# Patient Record
Sex: Female | Born: 1980 | Race: White | Hispanic: No | Marital: Single | Smoking: Former smoker
Health system: Southern US, Community
[De-identification: ages and names within clinical notes are randomized; demographics above are authoritative.]

## PROBLEM LIST (undated history)

## (undated) DIAGNOSIS — F32A Depression, unspecified: Secondary | ICD-10-CM

## (undated) DIAGNOSIS — I5032 Chronic diastolic (congestive) heart failure: Secondary | ICD-10-CM

## (undated) DIAGNOSIS — E119 Type 2 diabetes mellitus without complications: Secondary | ICD-10-CM

## (undated) DIAGNOSIS — E785 Hyperlipidemia, unspecified: Secondary | ICD-10-CM

## (undated) DIAGNOSIS — L03119 Cellulitis of unspecified part of limb: Secondary | ICD-10-CM

## (undated) DIAGNOSIS — N179 Acute kidney failure, unspecified: Secondary | ICD-10-CM

## (undated) DIAGNOSIS — F329 Major depressive disorder, single episode, unspecified: Secondary | ICD-10-CM

## (undated) DIAGNOSIS — E662 Morbid (severe) obesity with alveolar hypoventilation: Secondary | ICD-10-CM

## (undated) DIAGNOSIS — E039 Hypothyroidism, unspecified: Secondary | ICD-10-CM

## (undated) DIAGNOSIS — I493 Ventricular premature depolarization: Secondary | ICD-10-CM

## (undated) DIAGNOSIS — D649 Anemia, unspecified: Secondary | ICD-10-CM

## (undated) DIAGNOSIS — G4730 Sleep apnea, unspecified: Secondary | ICD-10-CM

## (undated) DIAGNOSIS — I10 Essential (primary) hypertension: Secondary | ICD-10-CM

## (undated) DIAGNOSIS — K219 Gastro-esophageal reflux disease without esophagitis: Secondary | ICD-10-CM

## (undated) DIAGNOSIS — A419 Sepsis, unspecified organism: Secondary | ICD-10-CM

## (undated) DIAGNOSIS — Z6841 Body Mass Index (BMI) 40.0 and over, adult: Secondary | ICD-10-CM

## (undated) DIAGNOSIS — L0390 Cellulitis, unspecified: Secondary | ICD-10-CM

## (undated) DIAGNOSIS — G4733 Obstructive sleep apnea (adult) (pediatric): Secondary | ICD-10-CM

## (undated) DIAGNOSIS — I872 Venous insufficiency (chronic) (peripheral): Secondary | ICD-10-CM

## (undated) HISTORY — DX: Acute kidney failure, unspecified: N17.9

## (undated) HISTORY — DX: Body Mass Index (BMI) 40.0 and over, adult: Z684

## (undated) HISTORY — DX: Hyperlipidemia, unspecified: E78.5

## (undated) HISTORY — DX: Chronic diastolic (congestive) heart failure: I50.32

## (undated) HISTORY — DX: Ventricular premature depolarization: I49.3

## (undated) HISTORY — PX: WISDOM TOOTH EXTRACTION: SHX21

## (undated) HISTORY — DX: Venous insufficiency (chronic) (peripheral): I87.2

## (undated) HISTORY — DX: Morbid (severe) obesity with alveolar hypoventilation: E66.2

## (undated) HISTORY — DX: Major depressive disorder, single episode, unspecified: F32.9

## (undated) HISTORY — DX: Cellulitis of unspecified part of limb: L03.119

## (undated) HISTORY — DX: Obstructive sleep apnea (adult) (pediatric): G47.33

## (undated) HISTORY — DX: Depression, unspecified: F32.A

## (undated) HISTORY — DX: Type 2 diabetes mellitus without complications: E11.9

## (undated) HISTORY — DX: Morbid (severe) obesity due to excess calories: E66.01

## (undated) HISTORY — DX: Anemia, unspecified: D64.9

---

## 2005-01-20 ENCOUNTER — Emergency Department (HOSPITAL_COMMUNITY): Admission: EM | Admit: 2005-01-20 | Discharge: 2005-01-20 | Payer: Self-pay | Admitting: Family Medicine

## 2005-04-16 ENCOUNTER — Emergency Department (HOSPITAL_COMMUNITY): Admission: EM | Admit: 2005-04-16 | Discharge: 2005-04-16 | Payer: Self-pay | Admitting: Emergency Medicine

## 2005-08-11 ENCOUNTER — Emergency Department (HOSPITAL_COMMUNITY): Admission: EM | Admit: 2005-08-11 | Discharge: 2005-08-11 | Payer: Self-pay | Admitting: Emergency Medicine

## 2005-08-19 ENCOUNTER — Ambulatory Visit: Payer: Self-pay | Admitting: Internal Medicine

## 2005-09-07 ENCOUNTER — Ambulatory Visit: Payer: Self-pay | Admitting: Internal Medicine

## 2005-09-16 ENCOUNTER — Ambulatory Visit: Payer: Self-pay | Admitting: Internal Medicine

## 2005-09-23 ENCOUNTER — Ambulatory Visit (HOSPITAL_COMMUNITY): Admission: RE | Admit: 2005-09-23 | Discharge: 2005-09-23 | Payer: Self-pay | Admitting: Internal Medicine

## 2005-12-27 ENCOUNTER — Inpatient Hospital Stay (HOSPITAL_COMMUNITY): Admission: EM | Admit: 2005-12-27 | Discharge: 2006-01-02 | Payer: Self-pay | Admitting: Emergency Medicine

## 2005-12-27 ENCOUNTER — Ambulatory Visit: Payer: Self-pay | Admitting: Internal Medicine

## 2005-12-29 ENCOUNTER — Encounter (INDEPENDENT_AMBULATORY_CARE_PROVIDER_SITE_OTHER): Payer: Self-pay | Admitting: Cardiology

## 2006-01-13 ENCOUNTER — Ambulatory Visit: Payer: Self-pay | Admitting: Internal Medicine

## 2006-01-24 ENCOUNTER — Ambulatory Visit: Payer: Self-pay | Admitting: Internal Medicine

## 2007-04-23 ENCOUNTER — Emergency Department (HOSPITAL_COMMUNITY): Admission: EM | Admit: 2007-04-23 | Discharge: 2007-04-23 | Payer: Self-pay | Admitting: Emergency Medicine

## 2007-06-26 ENCOUNTER — Ambulatory Visit: Payer: Self-pay | Admitting: *Deleted

## 2007-06-26 DIAGNOSIS — E1159 Type 2 diabetes mellitus with other circulatory complications: Secondary | ICD-10-CM | POA: Insufficient documentation

## 2007-06-26 DIAGNOSIS — I10 Essential (primary) hypertension: Secondary | ICD-10-CM

## 2007-06-26 DIAGNOSIS — I152 Hypertension secondary to endocrine disorders: Secondary | ICD-10-CM | POA: Insufficient documentation

## 2007-06-29 ENCOUNTER — Telehealth (INDEPENDENT_AMBULATORY_CARE_PROVIDER_SITE_OTHER): Payer: Self-pay | Admitting: *Deleted

## 2007-06-29 ENCOUNTER — Encounter (INDEPENDENT_AMBULATORY_CARE_PROVIDER_SITE_OTHER): Payer: Self-pay | Admitting: *Deleted

## 2007-06-29 ENCOUNTER — Ambulatory Visit: Payer: Self-pay | Admitting: Internal Medicine

## 2007-07-13 ENCOUNTER — Encounter (INDEPENDENT_AMBULATORY_CARE_PROVIDER_SITE_OTHER): Payer: Self-pay | Admitting: Internal Medicine

## 2007-07-13 ENCOUNTER — Ambulatory Visit: Payer: Self-pay | Admitting: Infectious Disease

## 2007-07-13 LAB — CONVERTED CEMR LAB
Anti Nuclear Antibody(ANA): NEGATIVE
Neutrophil cytoplasmic antibodies,IgG,serum: <1:20 {titer}

## 2007-07-16 LAB — CONVERTED CEMR LAB
Aldosterone, Serum: 33
PRA: 13.2

## 2007-07-23 ENCOUNTER — Ambulatory Visit: Payer: Self-pay | Admitting: Internal Medicine

## 2007-07-23 ENCOUNTER — Encounter (INDEPENDENT_AMBULATORY_CARE_PROVIDER_SITE_OTHER): Payer: Self-pay | Admitting: Internal Medicine

## 2007-07-23 LAB — CONVERTED CEMR LAB
Cholesterol: 209 mg/dL — ABNORMAL HIGH (ref 0–200)
HDL: 44 mg/dL (ref >39)
LDL Cholesterol: 108 mg/dL — ABNORMAL HIGH (ref 0–99)
VLDL: 57 mg/dL — ABNORMAL HIGH (ref 0–40)

## 2007-08-23 ENCOUNTER — Telehealth: Payer: Self-pay | Admitting: *Deleted

## 2007-10-15 ENCOUNTER — Telehealth: Payer: Self-pay | Admitting: *Deleted

## 2007-12-11 ENCOUNTER — Telehealth (INDEPENDENT_AMBULATORY_CARE_PROVIDER_SITE_OTHER): Payer: Self-pay | Admitting: *Deleted

## 2009-08-06 ENCOUNTER — Encounter (INDEPENDENT_AMBULATORY_CARE_PROVIDER_SITE_OTHER): Payer: Self-pay | Admitting: *Deleted

## 2009-10-14 ENCOUNTER — Encounter (INDEPENDENT_AMBULATORY_CARE_PROVIDER_SITE_OTHER): Payer: Self-pay | Admitting: Emergency Medicine

## 2009-10-14 ENCOUNTER — Emergency Department (HOSPITAL_COMMUNITY): Admission: EM | Admit: 2009-10-14 | Discharge: 2009-10-14 | Payer: Self-pay | Admitting: Emergency Medicine

## 2009-10-18 ENCOUNTER — Encounter (INDEPENDENT_AMBULATORY_CARE_PROVIDER_SITE_OTHER): Payer: Self-pay | Admitting: Emergency Medicine

## 2009-10-18 ENCOUNTER — Ambulatory Visit: Payer: Self-pay | Admitting: Vascular Surgery

## 2009-10-18 ENCOUNTER — Emergency Department (HOSPITAL_COMMUNITY): Admission: EM | Admit: 2009-10-18 | Discharge: 2009-10-18 | Payer: Self-pay | Admitting: Family Medicine

## 2009-10-18 ENCOUNTER — Inpatient Hospital Stay (HOSPITAL_COMMUNITY): Admission: EM | Admit: 2009-10-18 | Discharge: 2009-10-23 | Payer: Self-pay | Admitting: Emergency Medicine

## 2009-10-20 ENCOUNTER — Encounter (INDEPENDENT_AMBULATORY_CARE_PROVIDER_SITE_OTHER): Payer: Self-pay | Admitting: Internal Medicine

## 2010-09-25 ENCOUNTER — Emergency Department (HOSPITAL_COMMUNITY): Admission: EM | Admit: 2010-09-25 | Discharge: 2010-09-25 | Payer: Self-pay | Admitting: Family Medicine

## 2010-12-26 LAB — CONVERTED CEMR LAB
ALT: 16 units/L (ref 0–35)
BUN: 10 mg/dL (ref 6–23)
CRP: 0.3 mg/dL (ref <0.6)
Calcium, Total (PTH): 9.6 mg/dL (ref 8.4–10.5)
Chloride: 105 meq/L (ref 96–112)
Creatinine, Ser: 0.65 mg/dL (ref 0.40–1.20)
Microalb, Ur: 8.05 mg/dL — ABNORMAL HIGH (ref 0.00–1.89)
Sodium: 137 meq/L (ref 135–145)
Total Protein: 7.2 g/dL (ref 6.0–8.3)

## 2011-03-02 LAB — COMPREHENSIVE METABOLIC PANEL
Albumin: 2.8 g/dL — ABNORMAL LOW (ref 3.5–5.2)
Alkaline Phosphatase: 65 U/L (ref 39–117)
BUN: 20 mg/dL (ref 6–23)
Chloride: 100 mEq/L (ref 96–112)
Creatinine, Ser: 1.22 mg/dL — ABNORMAL HIGH (ref 0.4–1.2)
GFR calc non Af Amer: 52 mL/min — ABNORMAL LOW (ref >60)
Glucose, Bld: 110 mg/dL — ABNORMAL HIGH (ref 70–99)
Potassium: 4 mEq/L (ref 3.5–5.1)
Total Bilirubin: 0.6 mg/dL (ref 0.3–1.2)

## 2011-03-02 LAB — URINALYSIS, ROUTINE W REFLEX MICROSCOPIC
Glucose, UA: NEGATIVE mg/dL
Ketones, ur: NEGATIVE mg/dL
Nitrite: NEGATIVE
Specific Gravity, Urine: 1.022 (ref 1.005–1.030)
Urobilinogen, UA: 0.2 mg/dL (ref 0.0–1.0)
pH: 5.5 (ref 5.0–8.0)

## 2011-03-02 LAB — DIFFERENTIAL
Eosinophils Absolute: 0.2 10*3/uL (ref 0.0–0.7)
Lymphocytes Relative: 24 % (ref 12–46)
Lymphs Abs: 1.9 10*3/uL (ref 0.7–4.0)
Monocytes Relative: 10 % (ref 3–12)

## 2011-03-02 LAB — POCT I-STAT, CHEM 8
Calcium, Ion: 1.04 mmol/L — ABNORMAL LOW (ref 1.12–1.32)
Creatinine, Ser: 1.1 mg/dL (ref 0.4–1.2)
HCT: 38 % (ref 36.0–46.0)
Sodium: 135 mEq/L (ref 135–145)
TCO2: 27 mmol/L (ref 0–100)

## 2011-03-02 LAB — CBC
Hemoglobin: 10.9 g/dL — ABNORMAL LOW (ref 12.0–15.0)
Hemoglobin: 12.2 g/dL (ref 12.0–15.0)
MCHC: 34 g/dL (ref 30.0–36.0)
MCHC: 34.4 g/dL (ref 30.0–36.0)
RBC: 3.51 MIL/uL — ABNORMAL LOW (ref 3.87–5.11)
WBC: 10.3 10*3/uL (ref 4.0–10.5)

## 2011-03-02 LAB — URINE MICROSCOPIC-ADD ON

## 2011-03-02 LAB — CULTURE, BLOOD (ROUTINE X 2): Culture: NO GROWTH

## 2011-03-02 LAB — CARDIAC PANEL(CRET KIN+CKTOT+MB+TROPI)
CK, MB: 1.2 ng/mL (ref 0.3–4.0)
CK, MB: 1.2 ng/mL (ref 0.3–4.0)
CK, MB: 1.2 ng/mL (ref 0.3–4.0)
Relative Index: INVALID (ref 0.0–2.5)
Total CK: 76 U/L (ref 7–177)
Total CK: 79 U/L (ref 7–177)
Troponin I: 0.02 ng/mL (ref 0.00–0.06)
Troponin I: 0.03 ng/mL (ref 0.00–0.06)

## 2011-03-02 LAB — PROTIME-INR: INR: 0.95 (ref 0.00–1.49)

## 2011-03-02 LAB — URINE CULTURE: Colony Count: 40000

## 2011-03-02 LAB — BASIC METABOLIC PANEL
BUN: 14 mg/dL (ref 6–23)
Creatinine, Ser: 0.8 mg/dL (ref 0.4–1.2)
GFR calc non Af Amer: >60 mL/min (ref >60)
Glucose, Bld: 137 mg/dL — ABNORMAL HIGH (ref 70–99)
Potassium: 4.2 mEq/L (ref 3.5–5.1)

## 2011-03-02 LAB — BRAIN NATRIURETIC PEPTIDE: Pro B Natriuretic peptide (BNP): 74 pg/mL (ref 0.0–100.0)

## 2011-04-15 NOTE — Consult Note (Signed)
NAMENALEIGHA, RAIMONDI NO.:  [id number]   MEDICAL RECORD NO.:  [id number]          PATIENT TYPE:  INP   LOCATION:  5019                         FACILITY:  [facility]   PHYSICIAN:  Bevelyn Buckles. Champey, M.D.DATE OF BIRTH:  1981/07/31   DATE OF CONSULTATION:  12/30/2005  DATE OF DISCHARGE:                                   CONSULTATION   REASON FOR CONSULTATION:  Headache and white matter lesions on MRI.   HISTORY OF PRESENT ILLNESS:  Ms. Isidoro is a 30 year old Caucasian female  with past medical history of hypertension, cardiomyopathy, and obesity who  initially presented with elevated blood pressure and headaches.  The patient  has a history of uncontrolled hypertension and headache for several years.  Her pressures initially were over 200/100 on presentation.  Occasionally,  the patient will get some white, squiggly lines prior to her headaches and  occasional blurred vision.  The patient states occasionally, she will also  feel light headed upon rising quickly.  She has been complaining of  decreased energy and fatigue with occasional nausea.  She denies any focal  weakness, numbness, speech problems, balance problems, falls, vertigo, or  loss of consciousness.  The patient has had an MRI of the brain here that  showed significant white matter lesions suggestive of possibly  demyelination, infectious or inflammatory processes.   PAST MEDICAL HISTORY:  Positive for hypertension and cardiomegaly.   MEDICATIONS PRIOR TO ADMISSION:  None.   CURRENT MEDICATIONS:  The patient is currently on hydrochlorothiazide,  Atenolol, Lisinopril.   ALLERGIES:  The patient has drug allergies to aspirin which causes hives.   FAMILY HISTORY:  Positive for hypertension and heart disease.   SOCIAL HISTORY:  The patient currently lives with a roommate, socially  drinks alcohol, denies tobacco or drug use.   REVIEW OF SYSTEMS:  Positive for bronchitis two weeks ago, shortness of  breath, and as per HPI.  Negative in greater than seven other organ systems.   PHYSICAL EXAMINATION:  VITAL SIGNS:  Temperature 97.2, pulse 67, respirations 20, blood pressure  151/91, O2 saturation 93% on room air.  HEENT:  Normocephalic, atraumatic, extraocular movements intact, pupils  equal, round, reactive to light.  NECK:  Supple, no carotid bruits.  HEART:  Regular.  LUNGS:  Clear.  ABDOMEN:  Soft, nontender.  EXTREMITIES:  No edema, good pulses.  NEUROLOGICAL:  The patient is awake, alert and oriented x 3, language is  fluent, memory and  knowledge are within normal limits.  Cranial nerves 2-12  are grossly intact.  Motor examination reveals 5/5 strength throughout,  unable to assess proximal right lower extremity secondary to recent  catheterization.  Tone is normal in all four extremities, no drift is noted.  Sensory examination is within normal limits to light touch.  Reflexes are  trace to 1+ throughout, toes downgoing bilaterally.  Cerebellar function is  within normal limits finger-to-nose.  Gait is not assessed secondary to cath  and unable to move at this point.   LABORATORY DATA:  WBC 7.4, hemoglobin 10.8, hematocrit 31.5, platelets 242.  Protime 13.7,  INR 1, PTT 29.  Sodium 137, potassium 3.9, chloride 103, CO2  26, BUN 15, creatinine 1, glucose 81, calcium 9.1.  Cholesterol 147, HDL 33,  LDL 90.  MRI showed numerous white matter lesions suggestive of  demyelinating process, inflammatory or infectious etiology.   IMPRESSION:  Ms. Cheever is a 30 year old Caucasian female with elevated blood  pressure, headaches, and white matter lesions on MRI suggestive of a  possible demyelinating disease or inflammatory process.  Her neurological  examination at this point is essentially nonfocal, however, the white matter  lesions are concerning for a demyelinating process or inflammatory process.  We will try the lumbar puncture today once the patient is mobile which is  around  3 p.m.  We will send her CSF fluid for appropriate studies.  If the  patient is stable after we obtain the basic results, the other studies that  will be sent for CSF can be followed as an outpatient, can also get visual  evoked potentials as an outpatient, as well.  This plan is discussed with  the patient who agrees to have the procedure done today.  We will follow up  with the patient while she is in the hospital.      Bevelyn Buckles. Nash Shearer, M.D.  Electronically Signed     DRC/MEDQ  D:  12/30/2005  T:  12/30/2005  Job:  [id number]

## 2011-04-15 NOTE — Discharge Summary (Signed)
Alexandra Beck, Alexandra Beck NO.:  [id number]   MEDICAL RECORD NO.:  [id number]          PATIENT TYPE:  INP   LOCATION:  5019                         FACILITY:  [facility]   PHYSICIAN:  Hillery Aldo, M.D.   DATE OF BIRTH:  Sep 16, 1981   DATE OF ADMISSION:  12/27/2005  DATE OF DISCHARGE:                                 DISCHARGE SUMMARY   DATE OF DISCHARGE:  Pending.   PRIMARY CARE PHYSICIAN:  Currently unassigned but will be referred to the  Outpatient Clinic at the [facility].   DISCHARGE DIAGNOSES:  1.  Malignant hypertension.  2.  Headache secondary to #1.  3.  Obesity.  4.  Congestive heart failure with ejection fraction of 20%.  5.  Proteinuria.  6.  White mater changes on MRI worrisome for demyelinating disease versus      vasculitis, workup ongoing.   DISCHARGE MEDICATIONS):  (May be amended at the actual discharge.)  1.  Atenolol 100 milligrams b.i.d.  2.  Lisinopril 40 milligrams b.i.d.  3.  Lasix 40 milligrams daily.  4.  Potassium chloride 20 mEq daily.   CONSULTATIONS:  1.  Dr. Nash Shearer of neurology  2.  Dr. Ladona Ridgel of cardiology.   PROCEDURES AND DIAGNOSTIC STUDIES:  1.  Chest x-ray on December 27, 2005 showed cardiomegaly with diffuse      peribronchial thickening and interstitial edema. No definite      consolidation, pleural effusion or hemothorax.  2.  CT scan of the head on December 27, 2005 showed left basal ganglia      hyperdensity which may represent small hypertensive hemorrhage versus      early physiologic calcification. Nonspecific hypoattenuation in deep      left frontal white mater.  3.  MRI/MRA of the brain on December 28, 2005 showed abnormal fairly      significant white mater type changes which may be related to      demyelinating process such as multiple sclerosis; however, acute      disseminating encephalomyelitis is also a possibility. Other      considerations are underlying vasculitis, inflammatory process or    infectious process. Tumor felt unlikely. However, close follow-up      recommended for further delineation. A small area of restricted border      motion and enhancement involving the right cerebellum can be further      delineated as by MR criteria which has the appearance suggestive of a      small acute infarct versus active areas of demyelination. MRA showed      flow within the major intracranial vessels.  4.  MRA of the renal arteries on December 28, 2005 showed left renal artery      with diffuse beaded appearance questionable for fibromuscular dysplasia.      Patent right renal origin with poor visualization of the right renal      artery distally. Patent visceral vasculature. No significant aortobi-      iliac disease.  5.  Renal arteriogram on December 30, 2005 was normal  6.  LP  under fluoroscopy on December 30, 2005: Successful LP with opening      pressure of 25 cm of water. 12 cc of clear CSF fluid was collected for      further evaluation.  7.  2-D echocardiography done on November 28, 2005 showed an ejection fraction      of 20% with markedly decreased LV systolic function. There was severe      diffuse left ventricular hypokinesis. The left ventricular wall      thickness was moderately increased. Features were also consistent with      severe diastolic dysfunction.   DISCHARGE LABORATORY VALUES:  Will be dictated at the time of actual  discharge.   HOSPITAL COURSE:  Problem 1: MALIGNANT HYPERTENSION: The patient was  admitted and empirically treated with metoprolol and lisinopril. She was  also put on hydrochlorothiazide which was changed to Lasix for better blood  pressure control. Blood pressure was controlled on this regimen. A workup  was undertaken to look for secondary causes of hypertension. She was not  found to have renal artery fibromuscular dysplasia or renal artery stenosis  based on diagnostic testing. Her cortisol level was within normal limits. At  the  time of this dictation, a 24-hour urine for metanephrines as being  collected to rule out pheochromocytoma. Given the findings of her MRI, a  concern has been raised for vasculitis and a vasculitis workup is currently  being done. The only test that has come back thus far is an elevated  sedimentation rate of 37.   Problem 2: CLASS 2 CONGESTIVE HEART FAILURE WITH AN EJECTION FRACTION OF  20%: The patient was seen in consultation by the cardiologist. Given her  young age and the severity of her cardiomyopathy, it was felt that she would  need ongoing cardiology follow-up. Consideration was made for switching her  labetalol to Coreg but given that she can not currently afford this  medication, we will keep her on labetalol for now. She does not have any  health care insurance. Her current medical management included beta blocker,  ACE inhibitor and diuretics. She will [facility] with [facility] as  outpatient.   Problem 3: HEADACHES: The patient's headaches were thought to be secondary  to her hypertension. They resolved with treatment and control of her blood  pressure.   Problem 4: WHITE MATER LESIONS ON MRI OF THE BRAIN: A neurology consultation  was obtained because of the abnormalities noted on the patient's MRI scan.  The neurologist did feel that demyelinating process or inflammatory process  was possible and therefore lumbar puncture was done under fluoroscopy. Her  CSF studies for oligoclonal bands are currently pending. She will follow up  with Dr. Nash Shearer as an outpatient and have her visual evoked potentials done  at that time. In the meantime, a vasculitis workup has been requested.   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 With her significant medical comorbidity, it is  comparative that we get her financial assistance. I have ordered that a financial counselor come and evaluate the patient and that  consideration be  made for application for Medicaid and disability benefits as she is  certainly disabled by her multiple medical problems and will need medical  care to avoid decompensating.   Problem 6: DISPOSITION: The patient will be discharged home when her workup  is completed.          ______________________________  Hillery Aldo, M.D.    CR/MEDQ  D:  01/01/2006  T:  01/02/2006  Job:  [id number]   cc:   Outpatient Clinic

## 2011-04-15 NOTE — Discharge Summary (Signed)
Alexandra Beck, Alexandra Beck                 ACCOUNT NO.:  [id number]   MEDICAL RECORD NO.:  [id number]          PATIENT TYPE:  INP   LOCATION:  5019                         FACILITY:  [facility]   PHYSICIAN:  Mobolaji B. Bakare, M.D.DATE OF BIRTH:  May 12, 1981   DATE OF ADMISSION:  12/27/2005  DATE OF DISCHARGE:  01/02/2006                                 DISCHARGE SUMMARY   ADDENDUM:  To dictation done by Dr. Salome Arnt .   Pending laboratory investigation revealed ANA to be negative. Sedimentation  rate was 26.  At the time of discharge oligoclonal bands result was not  available.  The patient had spoken to Dr. Nash Shearer (neurologist).  She will  followup with him in the office following the results of the CSF.   The patient has issues with finances.  She was seen by a Freight forwarder.  The patient was advised appropriately.   DISCHARGE MEDICATIONS:  1.  Amlodipine 10 mg daily.  2.  Atenolol 100 mg b.i.d.  3.  Furosemide 40 mg daily.  4.  Lisinopril 40 mg b.i.d.  5.  Potassium chloride 20 mEq daily.   FOLLOWUP APPOINTMENTS:  1.  With Dr. Bevelyn Buckles. Champey, M.D.  2.  Dr. Sharrell Ku.  The patient to call for appointment.  3.  [facility] on February 16 at 2:20 p.m.   PENDING LABORATORY DATA:  Urine metanephrine CRP complement, acute  hepatitis, aldolase and Borrelia, CSF with oligoclonal bands.      Mobolaji B. Corky Downs, M.D.  Electronically Signed     MBB/MEDQ  D:  01/20/2006  T:  01/22/2006  Job:  [id number]

## 2011-04-15 NOTE — Consult Note (Signed)
Alexandra Beck, Alexandra Beck NO.:  [id number]   MEDICAL RECORD NO.:  [id number]          PATIENT TYPE:  INP   LOCATION:  5019                         FACILITY:  [facility]   PHYSICIAN:  Doylene Canning. Ladona Ridgel, M.D.  DATE OF BIRTH:  24-Dec-1980   DATE OF CONSULTATION:  DATE OF DISCHARGE:                                   CONSULTATION   DATE OF CARDIOLOGY CONSULTATION:  December 31, 2005.   PHYSICIAN REQUESTING CONSULTATION:  Lianne Moris, MD.   INDICATION FOR CONSULTATION:  Evaluation of hypertension and cardiomyopathy.   HISTORY OF PRESENT ILLNESS:  The patient is a very pleasant, 30 year old  woman with a history of recurrent hypertension and obesity, who was admitted  to the hospital with an episode of malignant hypertension associated with a  headache.  The patient was found on MRI scan to have white matter disease.  The etiology of this was unclear, and there was a question of vasculitis.  She underwent a lumbar puncture, the initial results are pending.  The  patient also has undergone two-D echo, which demonstrated a cardiomyopathy  with an EF of 20 to 25%.  Her heart failure symptoms are very minimal.  She  is able to walk on level ground.  She states that if she gets in a hurry and  if her blood pressure is high, she does have increasing shortness of breath.  She denies peripheral edema.   ADDITIONAL PAST MEDICAL HISTORY:  1.  Obesity.  2.  She also carries a diagnosis of sleep apnea but has not had this      followed up and is not presently wearing a CPAP mask.   FAMILY HISTORY:  Positive for hypertension and heart disease.   SOCIAL HISTORY:  The patient drinks alcohol socially, denies tobacco or  recreational drug use.   REVIEW OF SYSTEMS:  She denies vision or hearing problems, nausea, vomiting,  diarrhea or constipation, polyuria, polydipsia, heat or cold intolerance,  recent weight changes although over she has over the last five years  gradually increased her  weight.  She denies any skin problems.  The rest of  her review of systems was negative except as noted in the HPI.   PHYSICAL EXAMINATION:  GENERAL:  She is a pleasant, young woman, morbidly  obese but in no distress.  VITAL SIGNS:  The blood pressure was 170/100, the pulse was 80 and regular,  the respirations were 18, temperature was 98.  The weight was 250 pounds.  The height was 5 foot 3 inches tall.  HEENT:  Normocephalic and atraumatic.  Pupils were equal and round.  The  oropharynx was moist.  The sclerae were anicteric.  NECK:  No jugulovenous distention, there was no thyromegaly, the trachea was  midline.  CARDIOVASCULAR:  Regular rate and rhythm with normal S1 and S2.  There was a  soft S4 gallop present.  The PMI was laterally displaced.  LUNGS:  Clear bilaterally to auscultation.  There are no wheezes, rales, or  rhonchi present.  There is no increase work of breathing.  ABDOMEN:  Obese,  nontender, nondistended.  There is no organomegaly present.  EXTREMITIES:  No cyanosis, clubbing, or edema.  Pulses were 2+ and  symmetric.  NEUROLOGIC:  Alert and oriented x3.  His cranial nerves were intact.  His  strength was 5/5 and symmetric.  SKIN:  Exam was normal.   ELECTROCARDIOGRAM:  The EKG demonstrates a sinus rhythm with biatrial  enlargement and lateral T-wave abnormality.   IMPRESSION:  1.  Malignant hypertension.  2.  Cardiomyopathy with Class I-II heart failure symptoms.  3.  Morbid obesity.  4.  Headache secondary to all of the above with an abnormal head CT with      white matter disease.   DISCUSSION:  I will defer the patient's hypertension workup to her primary  physician, although switching from the Labetalol to Coreg would be a  consideration, which might help her with the blood pressure control.  I will  plan to see her back in the office in followup.  She is instructed to call  for an appointment, and sometime she will need a pheochromocytoma workup,  which  we will plan on having her primary doctors direct.           ______________________________  Doylene Canning. Ladona Ridgel, M.D.     GWT/MEDQ  D:  12/31/2005  T:  12/31/2005  Job:  [id number]

## 2011-04-15 NOTE — H&P (Signed)
Alexandra Beck, FREITAS                 ACCOUNT NO.:  [id number]   MEDICAL RECORD NO.:  [id number]          PATIENT TYPE:  INP   LOCATION:  1830                         FACILITY:  [facility]   PHYSICIAN:  Jonna L. Robb Matar, M.D.DATE OF BIRTH:  02-Oct-1981   DATE OF ADMISSION:  12/27/2005  DATE OF DISCHARGE:                                HISTORY & PHYSICAL   PRIMARY CARE PHYSICIAN:  Unassigned.   CHIEF COMPLAINT:  Headache for an hour prior to coming to the emergency  room.   HISTORY:  This is a 30 year old overweight white female who has known about  hypertension for several years, but has simply been unable to afford  medications and follow-up for it.   ALLERGIES:  ASPIRIN gives her hives.   MEDICATIONS:  None.   PAST MEDICAL HISTORY:  1.  Hypertension.  2.  Obesity.  3.  Cardiomegaly.  She has known about this previously.  She said that last      year she was told her heart was quite large.   FAMILY HISTORY:  Father has fairly severe hypertension.  Mother has some  very mild hypertension.   SOCIAL HISTORY:  Nonsmoker.  Nondrinker.  She is a music major at [employer]   REVIEW OF SYSTEMS:  She has had a really bad headache.  She gets them  frequently and she never knows if they are migraines or from hypertension.  No chest pains.  Her hands and feet swell up, her feet mostly.  No blurred  vision.  No double vision.  She says her thyroid has been checked several  times and she was told that that was normal.  Other systems were reviewed  and were negative.   PHYSICAL EXAMINATION:  VITAL SIGNS:  Temperature 99.2, pulse 102,  respirations 22, blood pressure on admission was 211/138, is now down to  187/102, 96% O2 saturation.  GENERAL:  She is a well-developed, morbidly overweight white female.  She  listed her weight as going from 257 to 248 in the past week, says she really  has not been hungry.  HEENT:  Conjunctivae and lids were normal.  Fundus was normal.  There were  no  hemorrhages or exudates.  Extraocular movements were full.  She has  normal hearing, mucosa, and pharynx.  NECK:  Supple.  No masses, thyromegaly, or carotid bruits.  RESPIRATORY:  Effort is normal.  The lungs were clear without wheezing.  No  dullness.  HEART:  Regular rate and rhythm.  Normal S1, S2 without murmurs, rubs, or  gallops.  There was no cyanosis or clubbing.  She has 1+ edema.  BREASTS:  Without masses or tenderness.  ABDOMEN:  Nontender.  Normal bowel sounds.  No hepatosplenomegaly.  I did  not hear any bruits.  She had no cervical adenopathy.  MUSCULOSKELETAL:  Muscle strength 5/5.  SKIN:  No rash, lesions, or nodules.  NEUROLOGIC:  Cranial nerves are intact.  DTRs are 2+.  Sensation is normal.  She is alert and oriented x3.  Normal memory, judgment, and affect.   LABORATORY DATA:  Urinalysis is positive  for some mild proteinuria.  BUN 13,  creatinine 1.  Chest x-ray shows cardiomegaly and mild interstitial edema.  CT of the head is positive for a tiny left basal ganglia hyperdensity.  They  thought this might be a small bleed.  EKG shows left atrial and ventricular  hypertrophy with some ST changes.   IMPRESSION:  1.  Malignant hypertension.  My suggestion is that she go on two pills or      maybe three, the first an ACE plus HCTZ combination, the second a beta      blocker, and third, if needed, amlodipine all of which can be gotten      generic at [facility] for $4 per month and the patient says she would be      able to afford that.  She also thinks she can get student health to      monitor her once her blood pressure is better controlled.  2.  Cardiomegaly.  3.  Proteinuria.  An ACE inhibitor should be a good idea for her.  4.  Possible intracranial hemorrhage.  Will get an MRI scan in the morning      and try to get her blood pressures much more regulated through the      night.      Jonna L. Robb Matar, M.D.  Electronically Signed     JLB/MEDQ  D:   12/27/2005  T:  12/27/2005  Job:  [id number]

## 2011-06-28 ENCOUNTER — Inpatient Hospital Stay (HOSPITAL_COMMUNITY)
Admission: EM | Admit: 2011-06-28 | Discharge: 2011-06-30 | DRG: 607 | Disposition: A | Discharge status: Home / Self Care | Payer: Self-pay | Attending: Internal Medicine | Admitting: Internal Medicine

## 2011-06-28 ENCOUNTER — Inpatient Hospital Stay (INDEPENDENT_AMBULATORY_CARE_PROVIDER_SITE_OTHER)
Admission: RE | Admit: 2011-06-28 | Discharge: 2011-06-28 | Disposition: A | Discharge status: Home / Self Care | Payer: Self-pay | Source: Ambulatory Visit | Attending: Family Medicine | Admitting: Family Medicine

## 2011-06-28 DIAGNOSIS — I428 Other cardiomyopathies: Secondary | ICD-10-CM | POA: Diagnosis present

## 2011-06-28 DIAGNOSIS — I509 Heart failure, unspecified: Secondary | ICD-10-CM | POA: Diagnosis present

## 2011-06-28 DIAGNOSIS — G4730 Sleep apnea, unspecified: Secondary | ICD-10-CM | POA: Diagnosis present

## 2011-06-28 DIAGNOSIS — L03319 Cellulitis of trunk, unspecified: Secondary | ICD-10-CM

## 2011-06-28 DIAGNOSIS — M793 Panniculitis, unspecified: Principal | ICD-10-CM | POA: Diagnosis present

## 2011-06-28 DIAGNOSIS — E039 Hypothyroidism, unspecified: Secondary | ICD-10-CM | POA: Diagnosis present

## 2011-06-28 DIAGNOSIS — Z6841 Body Mass Index (BMI) 40.0 and over, adult: Secondary | ICD-10-CM

## 2011-06-28 DIAGNOSIS — Z8614 Personal history of Methicillin resistant Staphylococcus aureus infection: Secondary | ICD-10-CM

## 2011-06-28 DIAGNOSIS — I10 Essential (primary) hypertension: Secondary | ICD-10-CM | POA: Diagnosis present

## 2011-06-28 LAB — BASIC METABOLIC PANEL
Calcium: 9.5 mg/dL (ref 8.4–10.5)
Creatinine, Ser: 0.74 mg/dL (ref 0.50–1.10)
GFR calc Af Amer: >60 mL/min (ref >60)
Sodium: 137 mEq/L (ref 135–145)

## 2011-06-28 LAB — DIFFERENTIAL
Basophils Relative: 1 % (ref 0–1)
Eosinophils Absolute: 0.3 10*3/uL (ref 0.0–0.7)
Eosinophils Relative: 4 % (ref 0–5)
Lymphs Abs: 2.9 10*3/uL (ref 0.7–4.0)
Monocytes Relative: 9 % (ref 3–12)
Neutrophils Relative %: 53 % (ref 43–77)

## 2011-06-28 LAB — CBC
MCH: 30.8 pg (ref 26.0–34.0)
MCV: 89.3 fL (ref 78.0–100.0)
Platelets: 203 10*3/uL (ref 150–400)
RDW: 13.3 % (ref 11.5–15.5)
WBC: 8.6 10*3/uL (ref 4.0–10.5)

## 2011-06-29 LAB — COMPREHENSIVE METABOLIC PANEL
ALT: 14 U/L (ref 0–35)
AST: 13 U/L (ref 0–37)
Alkaline Phosphatase: 64 U/L (ref 39–117)
CO2: 28 mEq/L (ref 19–32)
Calcium: 9.1 mg/dL (ref 8.4–10.5)
Chloride: 102 mEq/L (ref 96–112)
GFR calc non Af Amer: >60 mL/min (ref >60)
Potassium: 3.8 mEq/L (ref 3.5–5.1)
Sodium: 138 mEq/L (ref 135–145)
Total Bilirubin: 0.4 mg/dL (ref 0.3–1.2)

## 2011-06-29 LAB — DIFFERENTIAL
Basophils Absolute: 0 10*3/uL (ref 0.0–0.1)
Basophils Relative: 0 % (ref 0–1)
Eosinophils Absolute: 0.3 10*3/uL (ref 0.0–0.7)
Neutro Abs: 6.4 10*3/uL (ref 1.7–7.7)
Neutrophils Relative %: 71 % (ref 43–77)

## 2011-06-29 LAB — CBC
Platelets: 184 10*3/uL (ref 150–400)
RBC: 4.32 MIL/uL (ref 3.87–5.11)
WBC: 9 10*3/uL (ref 4.0–10.5)

## 2011-06-29 LAB — HEMOGLOBIN A1C: Hgb A1c MFr Bld: 6 % — ABNORMAL HIGH (ref <5.7)

## 2011-07-01 LAB — WOUND CULTURE: Gram Stain: NONE SEEN

## 2011-07-03 NOTE — Discharge Summary (Signed)
  NAMECYNDY, BRAVER Alexandra Beck.:  [id number]  MEDICAL RECORD Alexandra Beck.:  [id number]  LOCATION:  5118                         FACILITY:  [facility]  PHYSICIAN:  Conley Canal, MD      DATE OF BIRTH:  December 18, 1980  DATE OF ADMISSION:  06/28/2011 DATE OF DISCHARGE:  06/30/2011                        DISCHARGE SUMMARY - REFERRING   PRIMARY CARE PHYSICIAN:  The patient has Alexandra Beck primary care provider.  DISCHARGE DIAGNOSES: 1. Panniculitis. 2. Hypertension, controlled. 3. Morbid obesity, BMI greater than 40. 4. Hypothyroidism. 5. Sleep apnea, on CPAP. 6. 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  DISCHARGE MEDICATIONS: 1. Ciprofloxacin 500 mg twice daily for 7 more days. 2. Augmentin 875 mg twice daily for 6 more days. 3. Hydrochlorothiazide 25 mg daily. 4. Synthroid 112 mcg daily. 5. Toprol-XL 100 mg daily. 6. Benicar 40 mg daily.  PROCEDURES PERFORMED:  None.  HOSPITAL COURSE:  Alexandra Beck Beck is a pleasant 30 year old female with previous history of MRSA cellulitis, who came in with complaints of swelling and redness over the pannus.  She was found to have panniculitis and was started on vancomycin.  Her white count at admission was 8.6.  She responded quite well to the vancomycin with improved inflammation of the same area.  Wound cultures have shown Alexandra Beck growth so far.  She has done relatively well and she will be discharged on Augmentin and ciprofloxacin to complete 7 more days, and she was encouraged to follow up with her PCP, she is planning to establish care with [facility].  DISCHARGE LABS:  Other labs include electrolytes which were normal. Hemoglobin A1c 6.  TSH 4.048.  DISPOSITION:  The patient is discharged in stable condition.  Time spent for discharge preparation less than 30 minutes.     Conley Canal, MD     SR/MEDQ  D:  06/30/2011  T:  06/30/2011  Job:  [id number]  Electronically Signed by Conley Canal  on 07/03/2011 05:46:41 PM

## 2011-09-16 ENCOUNTER — Inpatient Hospital Stay (INDEPENDENT_AMBULATORY_CARE_PROVIDER_SITE_OTHER)
Admission: RE | Admit: 2011-09-16 | Discharge: 2011-09-16 | Disposition: A | Discharge status: Home / Self Care | Payer: Self-pay | Source: Ambulatory Visit | Attending: Emergency Medicine | Admitting: Emergency Medicine

## 2011-09-16 ENCOUNTER — Ambulatory Visit (INDEPENDENT_AMBULATORY_CARE_PROVIDER_SITE_OTHER): Payer: Self-pay

## 2011-09-16 ENCOUNTER — Emergency Department (HOSPITAL_COMMUNITY)
Admission: EM | Admit: 2011-09-16 | Discharge: 2011-09-16 | Disposition: A | Discharge status: Home / Self Care | Payer: Self-pay | Attending: Emergency Medicine | Admitting: Emergency Medicine

## 2011-09-16 DIAGNOSIS — R0789 Other chest pain: Secondary | ICD-10-CM | POA: Insufficient documentation

## 2011-09-16 DIAGNOSIS — R0989 Other specified symptoms and signs involving the circulatory and respiratory systems: Secondary | ICD-10-CM | POA: Insufficient documentation

## 2011-09-16 DIAGNOSIS — G4730 Sleep apnea, unspecified: Secondary | ICD-10-CM | POA: Insufficient documentation

## 2011-09-16 DIAGNOSIS — R0601 Orthopnea: Secondary | ICD-10-CM | POA: Insufficient documentation

## 2011-09-16 DIAGNOSIS — I509 Heart failure, unspecified: Secondary | ICD-10-CM | POA: Insufficient documentation

## 2011-09-16 DIAGNOSIS — E039 Hypothyroidism, unspecified: Secondary | ICD-10-CM | POA: Insufficient documentation

## 2011-09-16 DIAGNOSIS — I10 Essential (primary) hypertension: Secondary | ICD-10-CM | POA: Insufficient documentation

## 2011-09-16 DIAGNOSIS — R0609 Other forms of dyspnea: Secondary | ICD-10-CM | POA: Insufficient documentation

## 2011-09-16 DIAGNOSIS — M7989 Other specified soft tissue disorders: Secondary | ICD-10-CM | POA: Insufficient documentation

## 2011-09-16 LAB — POCT I-STAT, CHEM 8
BUN: 18 mg/dL (ref 6–23)
Calcium, Ion: 1.16 mmol/L (ref 1.12–1.32)
TCO2: 27 mmol/L (ref 0–100)

## 2011-09-16 LAB — PRO B NATRIURETIC PEPTIDE: Pro B Natriuretic peptide (BNP): 237.7 pg/mL — ABNORMAL HIGH (ref 0–125)

## 2011-09-28 NOTE — H&P (Signed)
Alexandra Beck, WALLER NO.:  [id number]  MEDICAL RECORD NO.:  [id number]  LOCATION:                                 FACILITY:  PHYSICIAN:  Michiel Cowboy, MDDATE OF BIRTH:  12-01-1980  DATE OF ADMISSION:  06/28/2011 DATE OF DISCHARGE:                             HISTORY & PHYSICAL   PRIMARY CARE PROVIDER:  Currently none.  CHIEF COMPLAINT:  Swelling and redness over the pannus.  The patient is a 30 year old female with history of nonischemic cardiomyopathy, last echogram showing EF of 45% and has been stable for years now, also a history of morbid obesity and severe hypertension. The patient since Saturday started to have redness and swelling on her abdomen as well as noticed a draining site.  She presented to urgent care.  The site was put a dressing on, appear to be that was draining spontaneously, but given the extent of her cellulitis, she was sent to the emergency department and from there [facility] hospitalist was called for treatment with antibiotics.  The patient has history in the past of cellulitis of the left lower extremity for which she was admitted in November 2010.  She denies any fevers or chills.  No nausea, no vomiting.  She has not had any systemic complaints.  Otherwise, review of systems are negative. No chest pains recently.  She states that maybe her CPAP is not working quite properly, this should be adjusted, but other than that, she has not had any other complaints.  No leg swelling.  No worsening of dyspnea on exertion or shortness of breath.  REVIEW OF SYSTEMS:  Otherwise negative.  Ten systems reviewed.  PAST MEDICAL HISTORY:  Significant for: 1. Nonischemic cardiomyopathy with EF of 45-50%. 2. Hypertension. 3. Hypothyroidism. 4. Morbid obesity. 5. Sleep apnea on CPAP.  SOCIAL HISTORY:  The patient does not smoke or drink or abuse drugs. She has recently graduated from [employer].  FAMILY HISTORY:  Significant for father  with coronary artery disease and bladder cancer and severe hypertension.  Mother hypertension, hypothyroidism, and sleep apnea.  ALLERGIES:  ASPIRIN causes hives and shortness of breath.  DOXYCYCLINE also causes some gastric irritation and shortness of breath.  MEDICATIONS: 1. Benicar 40 mg daily. 2. Hydrochlorothiazide 25 mg daily. 3. Levothyroxine 112 mcg daily. 4. Toprol 100 mg daily.  PHYSICAL EXAMINATION:  VITAL SIGNS:  Temperature 98.5, blood pressure 148/85, pulse 70, respirations 20, satting 94% on room air. GENERAL:  The patient appears to be in no acute distress. HEENT:  Head nontraumatic.  Moist mucous membranes. LUNGS:  Distant but clear to auscultation bilaterally. HEART:.  Also distant.  I do not appreciate any murmurs. ABDOMEN:  Appears to be morbidly obese but soft, I do not appreciate any tenderness but there is an area involving the pannus measuring about 15 x 20 cm of pink/erythematous skin.  There is a small area which appears to be draining spontaneously. EXTREMITIES:  The lower extremity is obese but no edema noted. NEUROLOGIC:  Grossly intact. SKIN:  Otherwise intact except for above.  White blood cell count 8.6, hemoglobin 13.6, sodium 137, potassium 3.8, creatinine 0.74 with blood sugar of 791.  ASSESSMENT AND PLAN:  This is a 30 year old female with cellulitis of her abdomen.  We will admit, put her on vancomycin as she had a history of cellulitis in the past and this is a very extensive area.  She had to have IV antibiotics in the past for cellulitis of her leg.  She is currently afebrile.  We will not do blood cultures.  Can attempt to do wound culture.  History of congestive heart failure which has been stable.  She is currently euvolemic.  We will complete CHF order set since she has been stable.  Her last echogram was done in November 2010, showed improvement.  We will hold off on repeating for right now unless there are some change in symptoms.   We will continue her Benicar and metoprolol.  Hypertension, severe, labile.  We will continue Benicar, HCTZ, and Toprol.  I would strongly recommended for the patient to make sure she has a primary care provider.  Prophylaxis good p.o. intake and Lovenox.  Hypothyroidism.  We will continue her thyroid medication and check a TSH since not sure how long ago was the last time they checked it.  Morbid obesity.  The patient is at high risk for diabetes.  Given her recurrent infection, we will try to assess her for that.  We will obtain hemoglobin A1c.     Michiel Cowboy, MD     AVD/MEDQ  D:  06/28/2011  T:  06/28/2011  Job:  [id number]  Electronically Signed by Therisa Doyne MD on 09/28/2011 03:11:37 PM

## 2011-11-30 ENCOUNTER — Emergency Department (INDEPENDENT_AMBULATORY_CARE_PROVIDER_SITE_OTHER)
Admission: EM | Admit: 2011-11-30 | Discharge: 2011-11-30 | Disposition: A | Discharge status: Home / Self Care | Payer: Self-pay | Source: Home / Self Care | Attending: Emergency Medicine | Admitting: Emergency Medicine

## 2011-11-30 ENCOUNTER — Encounter: Payer: Self-pay | Admitting: Emergency Medicine

## 2011-11-30 DIAGNOSIS — I10 Essential (primary) hypertension: Secondary | ICD-10-CM

## 2011-11-30 DIAGNOSIS — E039 Hypothyroidism, unspecified: Secondary | ICD-10-CM

## 2011-11-30 HISTORY — DX: Essential (primary) hypertension: I10

## 2011-11-30 MED ORDER — METOPROLOL SUCCINATE ER 50 MG PO TB24: 100.0000 mg | ORAL_TABLET | Freq: Every day | ORAL | Status: DC

## 2011-11-30 MED ORDER — METOPROLOL SUCCINATE ER 50 MG PO TB24: 50.0000 mg | ORAL_TABLET | Freq: Every day | ORAL | Status: DC

## 2011-11-30 MED ORDER — LEVOTHYROXINE SODIUM 112 MCG PO TABS: 112.0000 ug | ORAL_TABLET | Freq: Every day | ORAL | Status: DC

## 2011-11-30 MED ORDER — OLMESARTAN MEDOXOMIL 40 MG PO TABS: 40.0000 mg | ORAL_TABLET | Freq: Every day | ORAL | Status: DC

## 2011-11-30 MED ORDER — HYDROCHLOROTHIAZIDE 25 MG PO TABS: 25.0000 mg | ORAL_TABLET | Freq: Every day | ORAL | Status: DC

## 2011-11-30 NOTE — ED Notes (Signed)
Requesting med refill.  [facility] earliest appointment [facility] is in 2 weeks.  .  Ran out of medicine 2 weeks ago.  Now has insurance and trying to secure a pcp, but is already out of medicine

## 2011-11-30 NOTE — ED Provider Notes (Signed)
History     CSN: [id number]  Arrival date & time 11/30/11  1929   First MD Initiated Contact with Patient 11/30/11 1935      Chief Complaint  Patient presents with  . Medication Refill    (Consider location/radiation/quality/duration/timing/severity/associated sxs/prior treatment) HPI Comments: The patient is a 31 year old female who presents tonight for refill on medication for blood pressure and hypothyroidism. She currently takes metoprolol XL 50 mg 2 per day, hydrochlorothiazide 25 mg per day, Benicar 40 mg per day, and Synthroid 112 mcg once daily. She denies any medication side effects. She's had no headaches, dizziness, shortness of breath, chest pain, tightness, pressure, palpitations, syncope, or ankle edema. She denies any history of heart disease, diabetes, kidney disease, or elevated cholesterol. She states she feels good as long as she takes her medications. If she gets off of them feels tired and rundown and get some headaches. She has been taking all her medications but has been out the last couple days. She has an appointment to [facility] a doctor [facility] in [facility] weeks.   Past Medical History  Diagnosis Date  . Hypertension   . Apnea   . CHF (congestive heart failure)     History reviewed. No pertinent past surgical history.  History reviewed. No pertinent family history.  History  Substance Use Topics  . Smoking status: Never Smoker   . Smokeless tobacco: Not on file  . Alcohol Use: No    OB History    Grav Para Term Preterm Abortions TAB SAB Ect Mult Living                  Review of Systems  Respiratory: Negative for cough, chest tightness, shortness of breath and wheezing.   Cardiovascular: Negative for chest pain, palpitations and leg swelling.  Neurological: Negative for dizziness, weakness, light-headedness, numbness and headaches.    Allergies  Aspirin  Home Medications   Current Outpatient Rx  Name Route Sig Dispense Refill  .  HYDROCHLOROTHIAZIDE 25 MG PO TABS Oral Take 1 tablet (25 mg total) by mouth daily. 30 tablet 0  . LEVOTHYROXINE SODIUM 112 MCG PO TABS Oral Take 1 tablet (112 mcg total) by mouth daily. 30 tablet 0  . METOPROLOL SUCCINATE ER 50 MG PO TB24 Oral Take 2 tablets (100 mg total) by mouth daily. 60 tablet 0  . OLMESARTAN MEDOXOMIL 40 MG PO TABS Oral Take 1 tablet (40 mg total) by mouth daily. 30 tablet 0    BP 161/116  Pulse 79  Temp(Src) 97.9 F (36.6 C) (Oral)  SpO2 100%  LMP 11/30/2011  Physical Exam  Nursing note and vitals reviewed. Constitutional: She appears well-developed and well-nourished. No distress.  Neck: No JVD present.  Cardiovascular: Normal rate, regular rhythm, normal heart sounds and intact distal pulses.  Exam reveals no gallop and no friction rub.   No murmur heard. Pulmonary/Chest: Effort normal and breath sounds normal. No respiratory distress. She has no wheezes. She has no rales.  Abdominal: Soft. Bowel sounds are normal. She exhibits no distension, no abdominal bruit, no pulsatile midline mass and no mass. There is no hepatosplenomegaly. There is no tenderness. There is no rebound and no guarding.  Musculoskeletal: She exhibits no edema.  Skin: She is not diaphoretic.    ED Course  Procedures (including critical care time)  Labs Reviewed - No data to display No results found.   1. Hypertension   2. Hypothyroidism       MDM  Roque Lias, MD 11/30/11 2131

## 2011-12-07 ENCOUNTER — Telehealth (HOSPITAL_COMMUNITY): Payer: Self-pay | Admitting: *Deleted

## 2011-12-07 NOTE — ED Notes (Signed)
Discussed with Dr. Juanetta Gosling, pt.'s needing another Rx. because Benicar requires prior authorization.  He changed Rx. to Losartan 100 mg. PO QD #30. Pt. notified and wants Rx. called to [facility] on [facility]. Rx. called to pharmacist @ [phone]. Vassie Moselle 12/07/2011

## 2012-04-17 ENCOUNTER — Encounter: Payer: BC Managed Care – PPO | Attending: Family Medicine | Admitting: *Deleted

## 2012-04-17 ENCOUNTER — Encounter: Payer: Self-pay | Admitting: *Deleted

## 2012-04-17 DIAGNOSIS — Z713 Dietary counseling and surveillance: Secondary | ICD-10-CM | POA: Insufficient documentation

## 2012-04-17 NOTE — Progress Notes (Signed)
  Medical Nutrition Therapy:  Appt start time: 0930 end time:  1030.  Assessment:  Morbid Obesity. Alexandra Beck is here today with a BMI of 65 kg/m^2 for assessment and MNT. 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 States MD told her she is pre-diabetic and has elevated cholesterol.  Has changed dietary intake to much healthier (and pesco-vegetarian) choices for last 3 mos.  Became upset during visit stating that she is miserable with her weight and wants to get it off.  Reports she was 387 lbs 3 mos ago; weighs in at 367.1 lbs today. No pain reported.   Lab Results  Component Value Date   HGBA1C 6.0* 06/29/2011   MEDICATIONS: See medication list; reconciled with pt.    DIETARY INTAKE:  Usual eating pattern includes 2-3 meals and 2 snacks per day.  24-hr recall:  Not done. For the last 3 months, pt reports changing dietary intake to pesco-vegetarian. Consumes black beans, salsa, celery and FF sour cream; 1/2 sweet potato; kale & tomato salad with almonds; frozen fruit & greek yogurt; roasted squash, asparagus, mushrooms and wheat pasta; tilapia, salmon, tuna, swai; 8 oz V8 fusion w/ 1/4 cup Fage yogurt and 1.5 cups fruit.   Usual physical activity: None  Estimated energy needs: 2300 calories = maintenance @ PA 1.0 1200-1400 calories 150-175 g carbohydrates 75-85 g protein 35-40 g fat  Progress Towards Goal(s):  In progress.   Nutritional Diagnosis:  [place]-3.3 Overweight/obesity As related to poor food choices .  As evidenced by patient-reported intake history and a BMI of 65 kg/m^2.    Intervention:  Nutrition education.  Monitoring/Evaluation:  Dietary intake, exercise, and body weight in 2 month(s) or prn.

## 2012-04-19 ENCOUNTER — Encounter: Payer: Self-pay | Admitting: *Deleted

## 2012-04-19 NOTE — Patient Instructions (Addendum)
Goals:  Eat 3 meals/day, Avoid meal skipping.   Add protein rich foods at all meals and snacks.  Limit carbohydrate to 2-3 servings (or 30-45 g) per meal and 15 g per snack.   Choose more whole grains, lean protein, low-fat dairy, and fruits/non-starchy vegetables.   Aim for >30 min of physical activity daily.  Limit sugar-sweetened beverages and concentrated sweets.

## 2012-07-18 ENCOUNTER — Ambulatory Visit: Payer: BC Managed Care – PPO | Admitting: *Deleted

## 2012-11-28 DIAGNOSIS — A419 Sepsis, unspecified organism: Secondary | ICD-10-CM

## 2012-11-28 DIAGNOSIS — N179 Acute kidney failure, unspecified: Secondary | ICD-10-CM

## 2012-11-28 HISTORY — DX: Acute kidney failure, unspecified: N17.9

## 2012-11-28 HISTORY — DX: Sepsis, unspecified organism: A41.9

## 2012-12-23 ENCOUNTER — Ambulatory Visit (INDEPENDENT_AMBULATORY_CARE_PROVIDER_SITE_OTHER): Payer: BC Managed Care – PPO | Admitting: Internal Medicine

## 2012-12-23 ENCOUNTER — Emergency Department (HOSPITAL_COMMUNITY)
Admission: EM | Admit: 2012-12-23 | Discharge: 2012-12-24 | Disposition: A | Discharge status: Home / Self Care | Payer: BC Managed Care – PPO | Source: Home / Self Care | Attending: Emergency Medicine | Admitting: Emergency Medicine

## 2012-12-23 ENCOUNTER — Encounter (HOSPITAL_COMMUNITY): Payer: Self-pay | Admitting: *Deleted

## 2012-12-23 VITALS — BP 154/95 | HR 119 | Temp 102.8°F | Resp 22 | Ht 62.0 in | Wt 386.0 lb

## 2012-12-23 DIAGNOSIS — L0390 Cellulitis, unspecified: Secondary | ICD-10-CM

## 2012-12-23 DIAGNOSIS — R21 Rash and other nonspecific skin eruption: Secondary | ICD-10-CM | POA: Insufficient documentation

## 2012-12-23 DIAGNOSIS — L03119 Cellulitis of unspecified part of limb: Secondary | ICD-10-CM | POA: Insufficient documentation

## 2012-12-23 DIAGNOSIS — Z79899 Other long term (current) drug therapy: Secondary | ICD-10-CM | POA: Insufficient documentation

## 2012-12-23 DIAGNOSIS — M7989 Other specified soft tissue disorders: Secondary | ICD-10-CM

## 2012-12-23 DIAGNOSIS — L03116 Cellulitis of left lower limb: Secondary | ICD-10-CM

## 2012-12-23 DIAGNOSIS — M79609 Pain in unspecified limb: Secondary | ICD-10-CM

## 2012-12-23 DIAGNOSIS — I509 Heart failure, unspecified: Secondary | ICD-10-CM | POA: Insufficient documentation

## 2012-12-23 DIAGNOSIS — E785 Hyperlipidemia, unspecified: Secondary | ICD-10-CM | POA: Insufficient documentation

## 2012-12-23 DIAGNOSIS — L02419 Cutaneous abscess of limb, unspecified: Secondary | ICD-10-CM | POA: Insufficient documentation

## 2012-12-23 DIAGNOSIS — I10 Essential (primary) hypertension: Secondary | ICD-10-CM | POA: Insufficient documentation

## 2012-12-23 DIAGNOSIS — M79605 Pain in left leg: Secondary | ICD-10-CM

## 2012-12-23 DIAGNOSIS — L03129 Acute lymphangitis of unspecified part of limb: Secondary | ICD-10-CM

## 2012-12-23 DIAGNOSIS — Z8669 Personal history of other diseases of the nervous system and sense organs: Secondary | ICD-10-CM | POA: Insufficient documentation

## 2012-12-23 DIAGNOSIS — R509 Fever, unspecified: Secondary | ICD-10-CM | POA: Insufficient documentation

## 2012-12-23 LAB — POCT CBC
Granulocyte percent: 82.5 %G — AB (ref 37–80)
HCT, POC: 33.7 % — AB (ref 37.7–47.9)
Hemoglobin: 11.8 g/dL — AB (ref 12.2–16.2)
Lymph, poc: 1 (ref 0.6–3.4)
MCHC: 35 g/dL (ref 31.8–35.4)
MPV: 8.9 fL (ref 0–99.8)
POC Granulocyte: 8.1 — AB (ref 2–6.9)
POC MID %: 7.4 %M (ref 0–12)
RDW, POC: 15.3 %

## 2012-12-23 MED ORDER — HYDROCODONE-ACETAMINOPHEN 7.5-500 MG/15ML PO SOLN: 15.0000 mL | Freq: Once | ORAL | Status: DC

## 2012-12-23 MED ORDER — CEFTRIAXONE SODIUM 1 G IJ SOLR: 1.0000 g | Freq: Once | INTRAMUSCULAR | Status: DC

## 2012-12-23 MED ORDER — ONDANSETRON 8 MG PO TBDP
8.0000 mg | ORAL_TABLET | Freq: Once | ORAL | Status: AC
  Administered 2012-12-24: 8 mg via ORAL
  Filled 2012-12-23: qty 1

## 2012-12-23 MED ORDER — VANCOMYCIN HCL IN DEXTROSE 1-5 GM/200ML-% IV SOLN
1000.0000 mg | Freq: Once | INTRAVENOUS | Status: DC
  Filled 2012-12-23: qty 200

## 2012-12-23 NOTE — ED Notes (Signed)
Patient present to ED with cellulitis on her left leg.  Patient noticed it this am.  Patient has pain on her left leg.  Patient was seen at [facility] for further evaluation and was sent here for further care.

## 2012-12-23 NOTE — Progress Notes (Signed)
[address] [place] [address]   Phone [phone]  Subjective:    Patient ID: Alexandra Beck, female    DOB: 1981/09/21, 32 y.o.   MRN: [id number]  HPI  Pt presents to clinic with 24h h/o leg pain and erythema with swelling. She has a fever and has started to feel terrible as the day has gone on.  She has h/o cellulitis that needed hospitalization for IV antibiotics in the past that felt similar to this.  She has no injury to her leg but she does have cracked heels.  It hurts terribly to walk, just to put pressure on her leg. She has had no recent travel.  Review of Systems  Constitutional: Positive for fever and chills.  Respiratory: Negative for cough, chest tightness and shortness of breath.   Cardiovascular: Negative for chest pain.  Musculoskeletal: Positive for gait problem (2nd to pain).       Objective:   Physical Exam  Constitutional: She is oriented to person, place, and time. She appears well-developed and well-nourished.       Tearful in the room.  Slow moving because she states that it hurts so much.  HENT:  Head: Normocephalic and atraumatic.  Right Ear: External ear normal.  Left Ear: External ear normal.  Pulmonary/Chest: Effort normal and breath sounds normal.  Musculoskeletal:       Left heel cracked and bleeding but no erythema or swelling or purulent drainage from the area.  Left leg erythema and edema - pain in calf, behind knee and groin area - erythema from ankle to mid back of leg.  Extreme TTP on exam.  ? Palpable cord in calf but hard to examine due to patient's pain.  Pulses are hard to find 2nd to patient's size.  Neurological: She is alert and oriented to person, place, and time.  Skin: Skin is warm and dry.  Psychiatric: She has a normal mood and affect. Her behavior is normal. Judgment and thought content normal.    Results for orders placed in visit on 12/23/12  POCT CBC      Component Value Range   WBC 9.8  4.6 - 10.2 K/uL   Lymph, poc 1.0   0.6 - 3.4   POC LYMPH PERCENT 10.1  10 - 50 %L   MID (cbc) 0.7  0 - 0.9   POC MID % 7.4  0 - 12 %M   POC Granulocyte 8.1 (*) 2 - 6.9   Granulocyte percent 82.5 (*) 37 - 80 %G   RBC 3.68 (*) 4.04 - 5.48 M/uL   Hemoglobin 11.8 (*) 12.2 - 16.2 g/dL   HCT, POC 21.3 (*) 08.6 - 47.9 %   MCV 91.7  80 - 97 fL   MCH, POC 32.1 (*) 27 - 31.2 pg   MCHC 35.0  31.8 - 35.4 g/dL   RDW, POC 57.8     Platelet Count, POC 94 (*) 142 - 424 K/uL   MPV 8.9  0 - 99.8 fL         Assessment & Plan:   1. Leg swelling  POCT CBC, cefTRIAXone (ROCEPHIN) injection 1 g, DISCONTINUED: cefTRIAXone (ROCEPHIN) injection 1 g  2. Leg pain, left  HYDROcodone-acetaminophen (LORTAB) 7.5-500 MG/15ML solution 15 mL  3. Cellulitis of left leg    4. Lymphangitis, acute, lower leg     Due to patient's history of need for hospitalization in the past, high fever expect patient to need IV antibiotics again.  Even  with normal WBC there is a shift towards grans which indicates a bacterial infection.  Gave patient Rocephin and Lortab Elixir in clinic and the pain did improve.  D/with  Dr Perrin Maltese and we decided because of the possibility of DVT an ER eval would be most appropriate at this time where they can do a LE doppler to r/o because she is at increased risk due to her obesity, though I think that cellulitis is the problem.  Pt is in agreement with the plan.

## 2012-12-24 DIAGNOSIS — Z6841 Body Mass Index (BMI) 40.0 and over, adult: Secondary | ICD-10-CM | POA: Insufficient documentation

## 2012-12-24 LAB — POCT I-STAT, CHEM 8
BUN: 24 mg/dL — ABNORMAL HIGH (ref 6–23)
Hemoglobin: 14.3 g/dL (ref 12.0–15.0)
Potassium: 5 mEq/L (ref 3.5–5.1)
Sodium: 134 mEq/L — ABNORMAL LOW (ref 135–145)
TCO2: 24 mmol/L (ref 0–100)

## 2012-12-24 MED ORDER — CLINDAMYCIN HCL 150 MG PO CAPS: 150.0000 mg | ORAL_CAPSULE | Freq: Four times a day (QID) | ORAL | Status: DC

## 2012-12-24 MED ORDER — CLINDAMYCIN HCL 300 MG PO CAPS
300.0000 mg | ORAL_CAPSULE | Freq: Once | ORAL | Status: AC
  Administered 2012-12-24: 300 mg via ORAL
  Filled 2012-12-24: qty 1

## 2012-12-24 MED ORDER — OXYCODONE-ACETAMINOPHEN 5-325 MG PO TABS
2.0000 | ORAL_TABLET | Freq: Once | ORAL | Status: AC
  Administered 2012-12-24: 2 via ORAL
  Filled 2012-12-24: qty 2

## 2012-12-24 MED ORDER — PROMETHAZINE HCL 25 MG PO TABS: 25.0000 mg | ORAL_TABLET | Freq: Four times a day (QID) | ORAL | Status: DC | PRN

## 2012-12-24 MED ORDER — OXYCODONE-ACETAMINOPHEN 5-325 MG PO TABS: 2.0000 | ORAL_TABLET | ORAL | Status: DC | PRN

## 2012-12-24 NOTE — ED Provider Notes (Signed)
History     CSN: [id number]  Arrival date & time 12/23/12  2231   First MD Initiated Contact with Patient 12/23/12 2307      Chief Complaint  Patient presents with  . Cellulitis    (Consider location/radiation/quality/duration/timing/severity/associated sxs/prior treatment) HPI Hx per PT. Fever today and rash to LLE. Some nausea no vomiting. Was evaluated at the Urgent Care, told her WBC was normal, but needed to come to the ER.  Onset today, has dry skin on her heells - she denies any known trauma, it is uncomfortable anterior shin, denies any calf pain/ CP/ SOB. She has h/o cellulitis in the past. Symptoms MOD in severity.    Past Medical History  Diagnosis Date  . Hypertension   . Apnea   . CHF (congestive heart failure)   . Morbid obesity   . Hyperlipidemia     Per patient  . Prediabetes     Per patient    History reviewed. No pertinent past surgical history.  No family history on file.  History  Substance Use Topics  . Smoking status: Never Smoker   . Smokeless tobacco: Not on file  . Alcohol Use: No    OB History    Grav Para Term Preterm Abortions TAB SAB Ect Mult Living                  Review of Systems  Constitutional: Negative for fever and chills.  HENT: Negative for neck pain and neck stiffness.   Eyes: Negative for pain.  Respiratory: Negative for shortness of breath.   Cardiovascular: Positive for leg swelling.  Gastrointestinal: Negative for abdominal pain.  Genitourinary: Negative for dysuria.  Musculoskeletal: Negative for back pain.  Skin: Positive for rash.  Neurological: Negative for headaches.  All other systems reviewed and are negative.    Allergies  Aspirin; Doxycycline; and Niaspan  Home Medications   Current Outpatient Rx  Name  Route  Sig  Dispense  Refill  . LEVOTHYROXINE SODIUM 150 MCG PO TABS   Oral   Take 150 mcg by mouth daily.         Marland Kitchen LOSARTAN POTASSIUM-HCTZ 100-25 MG PO TABS   Oral   Take 1 tablet by  mouth daily.         Marland Kitchen METFORMIN HCL 500 MG PO TABS   Oral   Take 500 mg by mouth daily with breakfast.          . METOPROLOL SUCCINATE ER 100 MG PO TB24   Oral   Take 100 mg by mouth daily. Take with or immediately following a meal.         . ROSUVASTATIN CALCIUM 10 MG PO TABS   Oral   Take 10 mg by mouth daily.           BP 122/81  Pulse 99  Temp 98.5 F (36.9 C) (Oral)  Resp 24  SpO2 96%  LMP 12/03/2012  Physical Exam  Nursing note and vitals reviewed. Constitutional: She is oriented to person, place, and time. She appears well-developed and well-nourished.  HENT:  Head: Normocephalic and atraumatic.  Eyes: EOM are normal. Pupils are equal, round, and reactive to light.  Neck: Neck supple.  Cardiovascular: Normal rate, regular rhythm, normal heart sounds and intact distal pulses.   Pulmonary/Chest: Effort normal and breath sounds normal. No respiratory distress.  Abdominal: Soft. Bowel sounds are normal. She exhibits no distension. There is no tenderness.  Musculoskeletal:  LLE: tender, erythema and increased warmth to touch to distal tib/ fib area outlined in ink. Distal N/V intact, dry skin to heel but no fluctuance or purulent drainage. No ulcer. Distal n/v intact.    Neurological: She is alert and oriented to person, place, and time.  Skin: Skin is warm and dry.    ED Course  Procedures (including critical care time)  Results for orders placed during the hospital encounter of 12/23/12  POCT I-STAT, CHEM 8      Component Value Range   Sodium 134 (*) 135 - 145 mEq/L   Potassium 5.0  3.5 - 5.1 mEq/L   Chloride 100  96 - 112 mEq/L   BUN 24 (*) 6 - 23 mg/dL   Creatinine, Ser 7.84 (*) 0.50 - 1.10 mg/dL   Glucose, Bld 696 (*) 70 - 99 mg/dL   Calcium, Ion 2.95 (*) 1.12 - 1.23 mmol/L   TCO2 24  0 - 100 mmol/L   Hemoglobin 14.3  12.0 - 15.0 g/dL   HCT 28.4  13.2 - 44.0 %    Poor peripheral IV access. PO medications provided.   RECORDS REVIEWED:  WBC 9.8 H/H 11/33  MDM   Fever and cellulitis. Treated with ABx. Labs obtained and reviewed. Fever improved. Plan outpatient management. RX provided. Reliable historian agrees to strict return precautions and plan recheck 48 hours. VS and nursing notes reviewed.          Sunnie Nielsen, MD 12/24/12 [phone]

## 2012-12-25 ENCOUNTER — Inpatient Hospital Stay (HOSPITAL_COMMUNITY): Payer: BC Managed Care – PPO

## 2012-12-25 ENCOUNTER — Inpatient Hospital Stay (HOSPITAL_COMMUNITY)
Admission: EM | Admit: 2012-12-25 | Discharge: 2013-01-06 | DRG: 563 | Disposition: A | Discharge status: Home Health | Payer: BC Managed Care – PPO | Attending: Internal Medicine | Admitting: Internal Medicine

## 2012-12-25 ENCOUNTER — Encounter (HOSPITAL_COMMUNITY): Payer: Self-pay | Admitting: Adult Health

## 2012-12-25 ENCOUNTER — Emergency Department (HOSPITAL_COMMUNITY): Payer: BC Managed Care – PPO

## 2012-12-25 DIAGNOSIS — I776 Arteritis, unspecified: Secondary | ICD-10-CM

## 2012-12-25 DIAGNOSIS — I10 Essential (primary) hypertension: Secondary | ICD-10-CM

## 2012-12-25 DIAGNOSIS — G9340 Encephalopathy, unspecified: Secondary | ICD-10-CM | POA: Diagnosis present

## 2012-12-25 DIAGNOSIS — E662 Morbid (severe) obesity with alveolar hypoventilation: Secondary | ICD-10-CM | POA: Diagnosis present

## 2012-12-25 DIAGNOSIS — M7989 Other specified soft tissue disorders: Secondary | ICD-10-CM

## 2012-12-25 DIAGNOSIS — J9620 Acute and chronic respiratory failure, unspecified whether with hypoxia or hypercapnia: Secondary | ICD-10-CM | POA: Diagnosis present

## 2012-12-25 DIAGNOSIS — E039 Hypothyroidism, unspecified: Secondary | ICD-10-CM | POA: Diagnosis present

## 2012-12-25 DIAGNOSIS — I110 Hypertensive heart disease with heart failure: Secondary | ICD-10-CM

## 2012-12-25 DIAGNOSIS — L03119 Cellulitis of unspecified part of limb: Secondary | ICD-10-CM

## 2012-12-25 DIAGNOSIS — E785 Hyperlipidemia, unspecified: Secondary | ICD-10-CM | POA: Diagnosis present

## 2012-12-25 DIAGNOSIS — I890 Lymphedema, not elsewhere classified: Secondary | ICD-10-CM | POA: Diagnosis present

## 2012-12-25 DIAGNOSIS — R7309 Other abnormal glucose: Secondary | ICD-10-CM | POA: Diagnosis present

## 2012-12-25 DIAGNOSIS — L0390 Cellulitis, unspecified: Secondary | ICD-10-CM

## 2012-12-25 DIAGNOSIS — Z6841 Body Mass Index (BMI) 40.0 and over, adult: Secondary | ICD-10-CM

## 2012-12-25 DIAGNOSIS — L0291 Cutaneous abscess, unspecified: Secondary | ICD-10-CM

## 2012-12-25 DIAGNOSIS — M6282 Rhabdomyolysis: Secondary | ICD-10-CM | POA: Diagnosis present

## 2012-12-25 DIAGNOSIS — N179 Acute kidney failure, unspecified: Secondary | ICD-10-CM | POA: Diagnosis present

## 2012-12-25 DIAGNOSIS — N189 Chronic kidney disease, unspecified: Secondary | ICD-10-CM | POA: Diagnosis present

## 2012-12-25 DIAGNOSIS — G4733 Obstructive sleep apnea (adult) (pediatric): Secondary | ICD-10-CM

## 2012-12-25 DIAGNOSIS — R51 Headache: Secondary | ICD-10-CM

## 2012-12-25 DIAGNOSIS — Z79899 Other long term (current) drug therapy: Secondary | ICD-10-CM

## 2012-12-25 DIAGNOSIS — I129 Hypertensive chronic kidney disease with stage 1 through stage 4 chronic kidney disease, or unspecified chronic kidney disease: Secondary | ICD-10-CM | POA: Diagnosis present

## 2012-12-25 DIAGNOSIS — J9600 Acute respiratory failure, unspecified whether with hypoxia or hypercapnia: Secondary | ICD-10-CM

## 2012-12-25 DIAGNOSIS — R7401 Elevation of levels of liver transaminase levels: Secondary | ICD-10-CM | POA: Diagnosis not present

## 2012-12-25 DIAGNOSIS — E871 Hypo-osmolality and hyponatremia: Secondary | ICD-10-CM | POA: Diagnosis present

## 2012-12-25 DIAGNOSIS — L02419 Cutaneous abscess of limb, unspecified: Principal | ICD-10-CM | POA: Diagnosis present

## 2012-12-25 DIAGNOSIS — M79609 Pain in unspecified limb: Secondary | ICD-10-CM

## 2012-12-25 DIAGNOSIS — R7402 Elevation of levels of lactic acid dehydrogenase (LDH): Secondary | ICD-10-CM | POA: Diagnosis not present

## 2012-12-25 DIAGNOSIS — I872 Venous insufficiency (chronic) (peripheral): Secondary | ICD-10-CM | POA: Diagnosis present

## 2012-12-25 DIAGNOSIS — Z23 Encounter for immunization: Secondary | ICD-10-CM

## 2012-12-25 DIAGNOSIS — R0689 Other abnormalities of breathing: Secondary | ICD-10-CM

## 2012-12-25 DIAGNOSIS — I509 Heart failure, unspecified: Secondary | ICD-10-CM | POA: Diagnosis present

## 2012-12-25 DIAGNOSIS — I428 Other cardiomyopathies: Secondary | ICD-10-CM

## 2012-12-25 DIAGNOSIS — E861 Hypovolemia: Secondary | ICD-10-CM

## 2012-12-25 HISTORY — DX: Cellulitis, unspecified: L03.90

## 2012-12-25 HISTORY — DX: Sleep apnea, unspecified: G47.30

## 2012-12-25 HISTORY — DX: Gastro-esophageal reflux disease without esophagitis: K21.9

## 2012-12-25 LAB — RAPID URINE DRUG SCREEN, HOSP PERFORMED
Amphetamines: NOT DETECTED
Opiates: POSITIVE — AB
Tetrahydrocannabinol: NOT DETECTED

## 2012-12-25 LAB — BLOOD GAS, ARTERIAL
RATE: 10 resp/min
TCO2: 26.9 mmol/L (ref 0–100)
pCO2 arterial: 52.6 mmHg — ABNORMAL HIGH (ref 35.0–45.0)
pH, Arterial: 7.303 — ABNORMAL LOW (ref 7.350–7.450)

## 2012-12-25 LAB — POCT I-STAT 3, ART BLOOD GAS (G3+)
Acid-Base Excess: 2 mmol/L (ref 0.0–2.0)
Acid-base deficit: 2 mmol/L (ref 0.0–2.0)
Bicarbonate: 27.6 mEq/L — ABNORMAL HIGH (ref 20.0–24.0)
Bicarbonate: 25.9 mEq/L — ABNORMAL HIGH (ref 20.0–24.0)
O2 Saturation: 89 %
Patient temperature: 98.6
Patient temperature: 98.6
TCO2: 29 mmol/L (ref 0–100)
pCO2 arterial: 64.4 mmHg (ref 35.0–45.0)
pH, Arterial: 7.259 — ABNORMAL LOW (ref 7.350–7.450)
pO2, Arterial: 459 mmHg — ABNORMAL HIGH (ref 80.0–100.0)
pO2, Arterial: 93 mmHg (ref 80.0–100.0)
pO2, Arterial: 66 mmHg — ABNORMAL LOW (ref 80.0–100.0)

## 2012-12-25 LAB — BASIC METABOLIC PANEL
CO2: 26 mEq/L (ref 19–32)
CO2: 26 mEq/L (ref 19–32)
Calcium: 8.4 mg/dL (ref 8.4–10.5)
Chloride: 91 mEq/L — ABNORMAL LOW (ref 96–112)
Chloride: 97 mEq/L (ref 96–112)
GFR calc Af Amer: 12 mL/min — ABNORMAL LOW (ref >90)
GFR calc non Af Amer: 13 mL/min — ABNORMAL LOW (ref >90)
GFR calc non Af Amer: 10 mL/min — ABNORMAL LOW (ref >90)
Glucose, Bld: 147 mg/dL — ABNORMAL HIGH (ref 70–99)
Glucose, Bld: 109 mg/dL — ABNORMAL HIGH (ref 70–99)
Potassium: 4.9 mEq/L (ref 3.5–5.1)
Potassium: 4.8 mEq/L (ref 3.5–5.1)
Potassium: 5 mEq/L (ref 3.5–5.1)
Sodium: 130 mEq/L — ABNORMAL LOW (ref 135–145)
Sodium: 135 mEq/L (ref 135–145)
Sodium: 134 mEq/L — ABNORMAL LOW (ref 135–145)

## 2012-12-25 LAB — LACTATE DEHYDROGENASE: LDH: 5792 U/L — ABNORMAL HIGH (ref 94–250)

## 2012-12-25 LAB — HEPATIC FUNCTION PANEL
Albumin: 3.1 g/dL — ABNORMAL LOW (ref 3.5–5.2)
Alkaline Phosphatase: 55 U/L (ref 39–117)
Total Protein: 7.2 g/dL (ref 6.0–8.3)

## 2012-12-25 LAB — ETHANOL: Alcohol, Ethyl (B): <11 mg/dL (ref 0–11)

## 2012-12-25 LAB — CBC WITH DIFFERENTIAL/PLATELET
Hemoglobin: 12.7 g/dL (ref 12.0–15.0)
Lymphocytes Relative: 17 % (ref 12–46)
Lymphs Abs: 1.7 10*3/uL (ref 0.7–4.0)
Monocytes Relative: 6 % (ref 3–12)
Neutro Abs: 7.9 10*3/uL — ABNORMAL HIGH (ref 1.7–7.7)
Neutrophils Relative %: 77 % (ref 43–77)
Platelets: 152 10*3/uL (ref 150–400)
RBC: 4.44 MIL/uL (ref 3.87–5.11)
WBC: 10.3 10*3/uL (ref 4.0–10.5)

## 2012-12-25 LAB — PROTIME-INR: Prothrombin Time: 19.4 seconds — ABNORMAL HIGH (ref 11.6–15.2)

## 2012-12-25 LAB — CREATININE, URINE, RANDOM: Creatinine, Urine: 282.12 mg/dL

## 2012-12-25 LAB — PROCALCITONIN: Procalcitonin: 27.01 ng/mL

## 2012-12-25 LAB — GLUCOSE, CAPILLARY
Glucose-Capillary: 124 mg/dL — ABNORMAL HIGH (ref 70–99)
Glucose-Capillary: 104 mg/dL — ABNORMAL HIGH (ref 70–99)
Glucose-Capillary: 93 mg/dL (ref 70–99)

## 2012-12-25 LAB — LACTIC ACID, PLASMA: Lactic Acid, Venous: 2.6 mmol/L — ABNORMAL HIGH (ref 0.5–2.2)

## 2012-12-25 LAB — CK: Total CK: 1956 U/L — ABNORMAL HIGH (ref 7–177)

## 2012-12-25 LAB — APTT: aPTT: 32 seconds (ref 24–37)

## 2012-12-25 LAB — URINE MICROSCOPIC-ADD ON

## 2012-12-25 LAB — MRSA PCR SCREENING: MRSA by PCR: NEGATIVE

## 2012-12-25 LAB — URINALYSIS, ROUTINE W REFLEX MICROSCOPIC
Glucose, UA: NEGATIVE mg/dL
Ketones, ur: 15 mg/dL — AB
Protein, ur: 100 mg/dL — AB

## 2012-12-25 LAB — HEMOGLOBIN A1C
Hgb A1c MFr Bld: 6.2 % — ABNORMAL HIGH (ref <5.7)
Mean Plasma Glucose: 131 mg/dL — ABNORMAL HIGH (ref <117)

## 2012-12-25 LAB — TSH: TSH: 1.527 u[IU]/mL (ref 0.350–4.500)

## 2012-12-25 LAB — NA AND K (SODIUM & POTASSIUM), RAND UR: Sodium, Ur: 18 mEq/L

## 2012-12-25 MED ORDER — VANCOMYCIN HCL 10 G IV SOLR
1500.0000 mg | Freq: Once | INTRAVENOUS | Status: AC
  Administered 2012-12-25: 1500 mg via INTRAVENOUS
  Filled 2012-12-25 (×2): qty 1500

## 2012-12-25 MED ORDER — LIDOCAINE HCL (PF) 1 % IJ SOLN
INTRAMUSCULAR | Status: AC
  Filled 2012-12-25: qty 5

## 2012-12-25 MED ORDER — SODIUM CHLORIDE 0.9 % IV SOLN: 1000.0000 mL | INTRAVENOUS | Status: DC

## 2012-12-25 MED ORDER — SODIUM CHLORIDE 0.9 % IV SOLN
1000.0000 mL | Freq: Once | INTRAVENOUS | Status: DC
  Administered 2012-12-25: 1000 mL via INTRAVENOUS

## 2012-12-25 MED ORDER — VANCOMYCIN HCL IN DEXTROSE 1-5 GM/200ML-% IV SOLN
1000.0000 mg | Freq: Once | INTRAVENOUS | Status: AC
  Administered 2012-12-25: 1000 mg via INTRAVENOUS
  Filled 2012-12-25: qty 200

## 2012-12-25 MED ORDER — HEPARIN SODIUM (PORCINE) 5000 UNIT/ML IJ SOLN
5000.0000 [IU] | Freq: Three times a day (TID) | INTRAMUSCULAR | Status: DC
  Administered 2012-12-25: 5000 [IU] via SUBCUTANEOUS
  Filled 2012-12-25 (×4): qty 1

## 2012-12-25 MED ORDER — PIPERACILLIN-TAZOBACTAM 3.375 G IVPB
3.3750 g | Freq: Three times a day (TID) | INTRAVENOUS | Status: DC
  Administered 2012-12-25 – 2012-12-26 (×4): 3.375 g via INTRAVENOUS
  Filled 2012-12-25 (×6): qty 50

## 2012-12-25 MED ORDER — SODIUM CHLORIDE 0.9 % IV SOLN
1000.0000 mL | INTRAVENOUS | Status: DC
  Administered 2012-12-25: 1000 mL via INTRAVENOUS

## 2012-12-25 MED ORDER — CLINDAMYCIN PHOSPHATE 900 MG/50ML IV SOLN
900.0000 mg | Freq: Once | INTRAVENOUS | Status: AC
  Administered 2012-12-25: 900 mg via INTRAVENOUS
  Filled 2012-12-25: qty 50

## 2012-12-25 MED ORDER — NALOXONE HCL 0.4 MG/ML IJ SOLN: 0.4000 mg | Freq: Once | INTRAMUSCULAR | Status: DC

## 2012-12-25 MED ORDER — INSULIN ASPART 100 UNIT/ML ~~LOC~~ SOLN: 2.0000 [IU] | SUBCUTANEOUS | Status: DC

## 2012-12-25 MED ORDER — SODIUM CHLORIDE 0.9 % IV SOLN: 1000.0000 mL | Freq: Once | INTRAVENOUS | Status: DC

## 2012-12-25 MED ORDER — SODIUM CHLORIDE 0.9 % IV BOLUS (SEPSIS)
2000.0000 mL | Freq: Once | INTRAVENOUS | Status: AC
  Administered 2012-12-25: 2000 mL via INTRAVENOUS

## 2012-12-25 MED ORDER — CLINDAMYCIN PHOSPHATE 900 MG/50ML IV SOLN
900.0000 mg | Freq: Four times a day (QID) | INTRAVENOUS | Status: DC
  Administered 2012-12-25 – 2012-12-27 (×8): 900 mg via INTRAVENOUS
  Filled 2012-12-25 (×10): qty 50

## 2012-12-25 NOTE — Progress Notes (Signed)
*  PRELIMINARY RESULTS* Vascular Ultrasound Left lower extremity venous duplex has been completed.  Preliminary findings: Technically difficult study due to body habitus. Left = no obvious evidence of DVT in visualized veins.  Farrel Demark, RDMS, RVT 12/25/2012, 10:22 AM

## 2012-12-25 NOTE — Consult Note (Addendum)
ORTHOPAEDIC CONSULTATION  REQUESTING PHYSICIAN: Adriana Reams Zubelevitskiy*  Chief Complaint: Left lower extremity pain, redness, possible compartment syndrome  HPI: Alexandra Beck is a 32 y.o. female who complains of  left lower extremity pain and redness that has been worsening. I was consulted by the critical care medicine physicians to evaluate for possible compartment syndrome due to the pain to palpation around the left calf. She has morbid obesity, and has had progressive increase in redness, and swelling, and pain around the left leg from the level of the ankle to the level of the knee. She came into the hospital in acute renal failure. She had mental status changes, and had respiratory failure as well.  Past Medical History  Diagnosis Date  . Hypertension   . Apnea   . CHF (congestive heart failure)   . Morbid obesity   . Hyperlipidemia     Per patient  . Prediabetes     Per patient   History reviewed. No pertinent past surgical history. History   Social History  . Marital Status: Single    Spouse Name: N/A    Number of Children: N/A  . Years of Education: N/A   Social History Main Topics  . Smoking status: Never Smoker   . Smokeless tobacco: None  . Alcohol Use: No  . Drug Use: No  . Sexually Active:    Other Topics Concern  . None   Social History Narrative  . None   History reviewed. No pertinent family history. Allergies  Allergen Reactions  . Aspirin     REACTION: throat swelling, hives  . Doxycycline Other (See Comments)    Abdominal pain  . Niaspan (Niacin Er)     Caused flushing   Prior to Admission medications   Medication Sig Start Date End Date Taking? Authorizing Provider  clindamycin (CLEOCIN) 150 MG capsule Take 150 mg by mouth every 6 (six) hours.   Yes Historical Provider, MD  levothyroxine (SYNTHROID, LEVOTHROID) 150 MCG tablet Take 150 mcg by mouth daily.   Yes Historical Provider, MD  losartan-hydrochlorothiazide (HYZAAR) 100-25 MG  per tablet Take 1 tablet by mouth daily.   Yes Historical Provider, MD  metFORMIN (GLUCOPHAGE) 500 MG tablet Take 500 mg by mouth daily with breakfast.    Yes Historical Provider, MD  metoprolol succinate (TOPROL-XL) 100 MG 24 hr tablet Take 100 mg by mouth daily. Take with or immediately following a meal.   Yes Historical Provider, MD  oxyCODONE-acetaminophen (PERCOCET/ROXICET) 5-325 MG per tablet Take 1-2 tablets by mouth every 4 (four) hours as needed. For pain   Yes Historical Provider, MD  promethazine (PHENERGAN) 25 MG tablet Take 25 mg by mouth every 6 (six) hours as needed. For nausea   Yes Historical Provider, MD  rosuvastatin (CRESTOR) 10 MG tablet Take 10 mg by mouth daily.   Yes Historical Provider, MD   Dg Chest [facility] 1 View  12/25/2012  *RADIOLOGY REPORT*  Clinical Data: Central line placement  PORTABLE CHEST - 1 VIEW  Comparison: Prior chest x-ray performed earlier today, 12/25/2012 at 01:23 a.m.  Findings: Interval placement of a right IJ central venous catheter. The tip projects over the superior cavoatrial junction.  No evidence of complicating pneumothorax.  Inspiratory volumes are low and there is increased bibasilar atelectasis.  Stable cardiomegaly. No acute osseous abnormality.  Mild pulmonary vascular congestion.  IMPRESSION:  1.  Interval placement of a right IJ central venous catheter without evidence of complicating pneumothorax.  The tip of the catheter projects  over the superior cavoatrial junction. 2.  Lower inspiratory volumes with increased bibasilar atelectasis 3.  Mild pulmonary vascular congestion   Original Report Authenticated By: Malachy Moan, M.D.    Dg Chest Port 1 View  12/25/2012  *RADIOLOGY REPORT*  Clinical Data: Hypoxia, shortness of breath.  PORTABLE CHEST - 1 VIEW  Comparison: 09/16/2011  Findings: Hypoaeration.  Heart size upper normal to mildly enlarged.  Mild bibasilar opacities.  No definite pleural effusion or pneumothorax.  No acute osseous finding.   IMPRESSION: Hypoaeration with mild bibasilar opacities; atelectasis versus early infiltrate.   Original Report Authenticated By: Jearld Lesch, M.D.     Positive ROS: All other systems have been reviewed and were otherwise negative with the exception of those mentioned in the HPI and as above.  Physical Exam: General: Lying in bed, with BiPAP in place. She is interactive. Cardiovascular: She has intact pedal pulses. She has severe soft tissue swelling over the left leg, to a lesser degree over the right leg. Respiratory: She is currently on BiPAP, and laboring slightly to breathe. GI: No organomegaly, abdomen is soft and non-tender, morbid obesity Skin: Left leg has extreme cellulitis with fiery redness, tenderness palpation around the skin. This is circumferential around the leg. Neurologic: Sensation intact distally Psychiatric: Patient is competent for consent with normal mood and affect Lymphatic: No axillary or cervical lymphadenopathy that I can appreciate, but this is limited secondary to body habitus.  MUSCULOSKELETAL: Right lower extremity has no significant cellulitis. EHL and FHL are firing.  The left leg has circumferential cellulitis from the ankle to the level of the proximal tibia, and her compartments are difficult to assess clinically due to her body habitus and the pain to palpation around her skin, however she has sensation completely intact throughout her foot, intact dorsiflexion and plantar flexion actively, with intact E. version and inversion. She has absolutely no pain with activation of the musculature of the calf in any compartment. She has intact dorsalis pedis pulses.  Assessment: Left leg severe cellulitis/possible necrotizing dermatitis  Plan: I do not see evidence for compartment syndrome based on the lack of pain with active and passive motion of the foot. She is currently going to get a CAT scan of the leg, to evaluate if in fact there is any evidence for  deep abscess. If so, then she may need surgical debridement. The CAT scan will also reflect whether or not there is any gas within the tissue, particularly the skin and subcutaneous fascia. If so then she may need transfer to [facility] for management in a burn unit, and may need fairly aggressive debridement and excision of the skin.  She's going to continue on the IV antibiotics, as well as the support for her renal function and her pulmonary function per the critical care medical team.  I will followup later today when the results of the CAT scan are available.  ADDENDUM:  CT with cellulitis, no abscess or gas.  Will follow.  C/W IV Abx.  Alexandra Cid P, MD Cell [phone] Pager [phone]  12/25/2012 11:36 AM

## 2012-12-25 NOTE — Progress Notes (Signed)
PULMONARY  / CRITICAL CARE MEDICINE  Name: Alexandra Beck MRN: [id number] DOB: March 02, 1981    ADMISSION DATE:  12/25/2012 CONSULTATION DATE:  12/25/2012  REFERRING MD :  EDP  CHIEF COMPLAINT:  Acute respiratory failure  BRIEF PATIENT DESCRIPTION: 32yo F with morbid obesity, OSA/OHS (not on positive pressure therapy for past month due to broken machine), opioids and chronic respiratory failure, and h/o cellulitis requiring IV abx was brought to [facility] ED with altered mental status and hypercarbia requiring BiPAP.  Labs reveled acute renal failure.  Of note she was on Losartan / HCTZ prior to admission and had decreased fluid intake for 24-48 h prior to coming to ED.  SIGNIFICANT EVENTS / STUDIES:  1/28  Admitted with acute on chronic respiratory failure requiring BiPAP and acute renal failure; placed on bipap; no IV access, RIJ placed; worsening LLE cellulitis. 1/28- eleavted ast/ alt, ARF, ? Compartment syndrome , myonecrosis for CT , ortho  LINES / TUBES: 1/28 RIJ >>  CULTURES: 1/28  Blood Cx x2 >>> 1/28 Urine Cx >> 1/28 Hepatitis panel >>  ANTIBIOTICS: Rocephin 1/27 x 1 Clindamycin 1/28 x 1 Vancomycin 1/28 >>> Zosyn 1/28 >>>  INTERVAL HISTORY: 1/28  Admitted with acute on chronic respiratory failure requiring BiPAP and acute renal failure; placed on bipap; no IV access, RIJ placed; worsening LLE cellulitis.   VITAL SIGNS: Temp:  [99.6 F (37.6 C)-101 F (38.3 C)] 100.2 F (37.9 C) (01/28 0825) Pulse Rate:  [71-93] 74  (01/28 0720) Resp:  [12-22] 16  (01/28 0720) BP: (86-112)/(38-75) 106/62 mmHg (01/28 0720) SpO2:  [70 %-100 %] 99 % (01/28 0720) FiO2 (%):  [20 %-35 %] 30 % (01/28 0720) Weight:  [386 lb (175.088 kg)-404 lb 1.7 oz (183.3 kg)] 404 lb 1.7 oz (183.3 kg) (01/28 0500)  HEMODYNAMICS:   VENTILATOR SETTINGS: Vent Mode:  [-]  FiO2 (%):  [20 %-35 %] 30 %  INTAKE / OUTPUT: Intake/Output      01/27 0701 - 01/28 0700 01/28 0701 - 01/29 0700   I.V. (mL/kg) 1000 (5.5)     Total Intake(mL/kg) 1000 (5.5)    Net +1000          PHYSICAL EXAMINATION: General:  Morbidly obese, mechanically ventilated with BiAPP Neuro:  Alert and oriented HEENT:  Dry membranes Cardiovascular:  RRR, no m/r/g Lungs:  Bilateral diminished air entry, no w/r/r Abdomen:  Obese, soft, NT Musculoskeletal:  Moves all extremities, LLE tender to touch, very erythematous, edematous Skin:  Intact with LLE cellulitis  LABS:  Lab 12/25/12 4098 12/25/12 0620 12/25/12 0306 12/25/12 0223 12/25/12 0222 12/25/12 0129 12/25/12 0126 12/24/12 0024 12/23/12 2054  HGB -- -- -- -- -- -- 12.7 14.3 11.8*  WBC -- -- -- -- -- -- 10.3 -- 9.8  PLT -- -- -- -- -- -- 152 -- --  NA -- -- -- -- -- -- 130* 134* --  K -- -- -- -- -- -- 4.9 5.0 --  CL -- -- -- -- -- -- 91* 100 --  CO2 -- -- -- -- -- -- 26 -- --  GLUCOSE -- -- -- -- -- -- 147* 134* --  BUN -- -- -- -- -- -- 44* 24* --  CREATININE -- -- -- -- -- -- 4.08* 1.20* --  CALCIUM -- -- -- -- -- -- 8.8 -- --  MG -- -- -- -- -- -- -- -- --  PHOS -- -- -- -- -- -- -- -- --  AST -- -- --  1610* -- -- -- -- --  ALT -- -- -- 3918* -- -- -- -- --  ALKPHOS -- -- -- 55 -- -- -- -- --  BILITOT -- -- -- 0.8 -- -- -- -- --  PROT -- -- -- 7.2 -- -- -- -- --  ALBUMIN -- -- -- 3.1* -- -- -- -- --  APTT -- 32 -- -- -- -- -- -- --  INR -- 1.70* -- -- -- -- -- -- --  LATICACIDVEN -- 1.7 -- -- 2.6* -- -- -- --  TROPONINI -- -- -- -- -- -- -- -- --  PROCALCITON -- -- -- 27.01 -- -- -- -- --  PROBNP -- -- -- -- -- -- -- -- --  O2SATVEN -- -- -- -- -- -- -- -- --  PHART 7.303* -- 7.259* -- -- 7.282* -- -- --  PCO2ART 52.6* -- 61.6* -- -- 64.4* -- -- --  PO2ART 109.0* -- 93.0 -- -- 459.0* -- -- --    Lab 12/25/12 0824 12/25/12 0600  GLUCAP 110* 124*    CXR:  1/28 >>> Hypoinflation, no overt airspace disease  ASSESSMENT / PLAN:  PULMONARY A:   Acute on chronic respiratory failure in setting of OSA/OHS, CPAP/BiPAP noncompliance, opioids. P:   Full  mechanical support with BiPAP, goal 4 hours on 2 hours off Would attempt to avoid intubation Trend ABG in pm / CXR reviewed Upright position  CARDIOVASCULAR A:  Hypotension, SIRS/Sepsis; not requiring pressors Hypovolemia Marginally elevated lactate.   H/o HTN  H/o systolic/diastolic CHF without evidence of exacerbation, last ECHO on record from 09/2009.  H/o dyslipidemia. RIJ placed P:  Goal MAP>60 NS@175 , will have to monitor for overload given h/o CHF She will likely need repeat ECHO one more stable Holding Lopressor, Losartan, HCTZ Crestor when able Bolus 2 liters open, lactic has improved cvp assessment Low threshold levophed and EGDT, not needed as of now  RENAL A:  Acute on chronic renal failure.   Hypovolemia.   Hyponatremia. Rhabdo P:   Trend BMP q6h NS@175  Such rapid increase crt, renal US No lasix for rhabdo as volume is too low  cvp  GASTROINTESTINAL A:   LFTs significantly elevated, shock liver unlikely as not on pressors, most worried for ischemic tissue P:   NPO GI Px (sepsis risk) Follow lFt, cpk CT leg Volume Acute hep panel ensure salicylate and tylenol levels ldh  HEMATOLOGIC A: No leukocytosis, WBC trending upward, coagulapthy (liver, group a strep sepsis?), follow for dic P:  Trend CBC Heparin for DVT -dc, see INR Doppler legs r/o dvt Fibrinogen, d-dimer (as FSP) factor 8  No role ffp at this stage  INFECTIOUS A:  Cellulitis LLE- h/o cellulitis req IV abx. ?DVT +/- necrosis, r/o compartment syndrome, myonecrosis, r/o group a strep as organisms P:   Blood and urine cultures Vanc, Zosyn, and Clindamycin (toxin inhibition) LLE STAT CT scan LE Venous Doppler Ortho consult, check pressures in leg and eval needs fasciotomy  ENDOCRINE  A:   Suspected DM.   Hyperglycemia.  Hypothyroidism. P:   ICU Glycemic Control Protocol, Phase 1 Hold Metformin, do not restart Hemoglobin A1c Will restart Synthroid once past acute period,  assess tsh  NEUROLOGIC A:   Acute encephalopathy (septic/methabolic) P:   RASS -1, goal 0 Holding opioids Synthroid when able.  CLINICAL SUMMARY: 32yo F with morbid obesity, OSA/OHS (not on positive pressure therapy for past month due to broken machine), opioids and chronic respiratory failure, and h/o cellulitis  requiring IV abx was brought to [facility] ED with altered mental status and hypercarbia requiring BiPAP.  Labs reveled acute renal failure.  Of note she was on Losartan / HCTZ prior to admission and had decreased fluid intake for 24-48 h prior to coming to ED. She was placed on bipap; a RIJ was placed; worsening LLE cellulitis, dopplers and CT pending; hydrating with IVF. I am concerned about myonecrosis, compartment syndrome and group a strep as organism.  Friend's contact # [phone].  I have personally obtained a history, examined the patient, evaluated laboratory and imaging results, formulated the assessment and plan and placed orders.  CRITICAL CARE:  The patient is critically ill with multiple organ systems failure and requires high complexity decision making for assessment and support, frequent evaluation and titration of therapies, application of advanced monitoring technologies and extensive interpretation of multiple databases. Critical Care Time devoted to patient care services described in this note is 50 minutes.   Genelle Gather, MD  Pulmonary and Critical Care Medicine [facility] Pager: [phone]  12/25/2012, 9:02 AM  Mcarthur Rossetti. Tyson Alias, MD, FACP Pgr: [phone] [place] Pul[facility] & Critical [facility]

## 2012-12-25 NOTE — ED Notes (Signed)
X-ray technician at bedside to collect chest x-ray films.  RN still at bedside attempting IV access; IV team now at bedside.

## 2012-12-25 NOTE — ED Notes (Signed)
Calling report now. 

## 2012-12-25 NOTE — ED Notes (Signed)
Patient continues to nod off during assessment; arousal at this time.

## 2012-12-25 NOTE — ED Notes (Signed)
Respiratory called to bring BiPAP machine and collect ABG.

## 2012-12-25 NOTE — Progress Notes (Signed)
ANTIBIOTIC CONSULT NOTE Pharmacy Consult for Vancomycin and Zosyn  Indication: cellulitis  Allergies  Allergen Reactions  . Aspirin     REACTION: throat swelling, hives  . Doxycycline Other (See Comments)    Abdominal pain  . Niaspan (Niacin Er)     Caused flushing    Patient Measurements: Height: 5' 1.81" (157 cm) Weight: 386 lb (175.088 kg) IBW/kg (Calculated) : 49.67  Adjusted Body Weight: 100 kg  Vital Signs: Temp: 101 F (38.3 C) (01/28 0027) Temp src: Oral (01/28 0027) BP: 89/43 mmHg (01/28 0227) Pulse Rate: 86  (01/28 0045)  Labs:  Basename 12/25/12 0126 12/24/12 0024 12/23/12 2054  WBC 10.3 -- 9.8  HGB 12.7 14.3 11.8*  PLT 152 -- --  LABCREA -- -- --  CREATININE 4.08* 1.20* --   Estimated Creatinine Clearance: 31.2 ml/min (by C-G formula based on Cr of 4.08). No results found for this basename: VANCOTROUGH:2,VANCOPEAK:2,VANCORANDOM:2,GENTTROUGH:2,GENTPEAK:2,GENTRANDOM:2,TOBRATROUGH:2,TOBRAPEAK:2,TOBRARND:2,AMIKACINPEAK:2,AMIKACINTROU:2,AMIKACIN:2, in the last 72 hours   Microbiology: No results found for this or any previous visit (from the past 720 hour(s)).  Medical History: Past Medical History  Diagnosis Date  . Hypertension   . Apnea   . CHF (congestive heart failure)   . Morbid obesity   . Hyperlipidemia     Per patient  . Prediabetes     Per patient    Medications:  Clindamycin  Synthroid  Hyzaar  Metformin  Toprol XL  Percocet  Phenergan  Crestor  Assessment: 32 yo morbidly obese female with LLE cellulitis and ARF for empiric antibiotics.  Vancomycin 1 g IV given in ED at 0300.  Goal of Therapy:  Vancomycin trough level 10-15 mcg/ml  Plan:  Vancomycin 1500 mg IV now (for total of 2500 mg tonight). F/U renal function and redose or check level as indicated Zosyn 3.375 g IV q8h   Eddie Candle 12/25/2012,4:22 AM

## 2012-12-25 NOTE — ED Notes (Signed)
Clindamycin currently paused; IV infiltrated.  IV team paged for another IV start.

## 2012-12-25 NOTE — ED Notes (Signed)
Dr Otter at bedside  

## 2012-12-25 NOTE — Progress Notes (Signed)
eLink Physician-Brief Progress Note Patient Name: Alexandra Beck DOB: [birth year] MRN: [id number]  Date of Service  12/25/2012   HPI/Events of Note   Best Practice  eICU Interventions  Subq heparin for DVT propy   Intervention Category Intermediate Interventions: Best-practice therapies (e.g. DVT, beta blocker, etc.)  Maycol Hoying 12/25/2012, 6:38 AM

## 2012-12-25 NOTE — ED Notes (Signed)
Patient diagnosed with cellulitis in left leg at [facility].  Patient's family member states that patient has been lethargic and confused today that started this morning.  Patient has history of sleep apnea and has not been using her CPAP machine for almost two weeks.  Upon arrival to room, patient changed into gown and connected to continuous cardiac, pulse ox, and blood pressure monitor.  Patient 73% O2 on room air; patient placed on non-re breather mask; O2 increased to 100%.  Will continue to monitor.

## 2012-12-25 NOTE — ED Notes (Signed)
Dr. Norlene Campbell at bedside.  Respiratory therapist at bedside changing BiPAP settings.

## 2012-12-25 NOTE — ED Notes (Addendum)
Admitting MD at bedside explaining plan of care and admission.

## 2012-12-25 NOTE — ED Notes (Signed)
ABG done by respiratory therapist.

## 2012-12-25 NOTE — ED Provider Notes (Signed)
History     CSN: [id number]  Arrival date & time 12/25/12  0014   First MD Initiated Contact with Patient 12/25/12 0051      Chief Complaint  Patient presents with  . Fever    (Consider location/radiation/quality/duration/timing/severity/associated sxs/prior treatment) HPI 32 year old female presents to emergency department with reported confusion, somnolence, and persistent infection. Patient was seen yesterday at urgent care and then at [facility] emergency department. She was diagnosed with left lower leg cellulitis. Patient was given IM Rocephin at urgent care, and oral dosing of clindamycin as she was unable to get IV access at [facility] period family reports she got home around 5 AM, and has been sleeping continuously since coming home. Around 5 PM, she took a dose of Percocet, Phenergan, and the antibiotic given to her. Patient has history of sleep apnea, but her machine has been broken for over 2 weeks. She's had fevers at home to 101. Family is concerned as they have not been able to have her eat or drink very well due to her somnolence. Patient noted to have oxygen saturations in the 70s upon arrival to the ER. She is somnolent and is a poor historian  Past Medical History  Diagnosis Date  . Hypertension   . Apnea   . CHF (congestive heart failure)   . Morbid obesity   . Hyperlipidemia     Per patient  . Prediabetes     Per patient    History reviewed. No pertinent past surgical history.  History reviewed. No pertinent family history.  History  Substance Use Topics  . Smoking status: Never Smoker   . Smokeless tobacco: Not on file  . Alcohol Use: No    OB History    Grav Para Term Preterm Abortions TAB SAB Ect Mult Living                  Review of Systems  Unable to perform ROS   Allergies  Aspirin; Doxycycline; and Niaspan  Home Medications   Current Outpatient Rx  Name  Route  Sig  Dispense  Refill  . CLINDAMYCIN HCL 150 MG PO CAPS   Oral  Take 150 mg by mouth every 6 (six) hours.         Marland Kitchen LEVOTHYROXINE SODIUM 150 MCG PO TABS   Oral   Take 150 mcg by mouth daily.         Marland Kitchen LOSARTAN POTASSIUM-HCTZ 100-25 MG PO TABS   Oral   Take 1 tablet by mouth daily.         Marland Kitchen METFORMIN HCL 500 MG PO TABS   Oral   Take 500 mg by mouth daily with breakfast.          . METOPROLOL SUCCINATE ER 100 MG PO TB24   Oral   Take 100 mg by mouth daily. Take with or immediately following a meal.         . OXYCODONE-ACETAMINOPHEN 5-325 MG PO TABS   Oral   Take 1-2 tablets by mouth every 4 (four) hours as needed. For pain         . PROMETHAZINE HCL 25 MG PO TABS   Oral   Take 25 mg by mouth every 6 (six) hours as needed. For nausea         . ROSUVASTATIN CALCIUM 10 MG PO TABS   Oral   Take 10 mg by mouth daily.           BP 96/46  Pulse 86  Temp 101 F (38.3 C) (Oral)  Resp 22  SpO2 100%  LMP 12/03/2012  Physical Exam  Nursing note and vitals reviewed. Constitutional: She appears well-developed.       Morbidly obese, somnolent but will arouse to shaking or loud voices  HENT:  Head: Normocephalic and atraumatic.  Nose: Nose normal.  Mouth/Throat: Oropharynx is clear and moist.  Eyes: Conjunctivae normal and EOM are normal. Pupils are equal, round, and reactive to light.  Neck: Normal range of motion. Neck supple. No JVD present. No tracheal deviation present. No thyromegaly present.  Cardiovascular: Normal rate, regular rhythm, normal heart sounds and intact distal pulses.  Exam reveals no gallop and no friction rub.   No murmur heard. Pulmonary/Chest: Effort normal. No stridor. No respiratory distress. She has no wheezes. She has no rales. She exhibits no tenderness.       Diminished breath sounds secondary to body habitus and poor inspiration  Abdominal: Soft. Bowel sounds are normal. She exhibits no distension and no mass. There is no tenderness. There is no rebound and no guarding.  Musculoskeletal: Normal  range of motion. She exhibits edema and tenderness.       Erythema noted to left lower leg sparing the foot. Areas demarcated by surgical marking from yesterday. No extension of the erythema. Area is warm, tender to touch  Lymphadenopathy:    She has no cervical adenopathy.  Neurological:       Patient is somnolent but arouses briefly to fall back to sleep  Skin: Skin is warm and dry. No rash noted. No erythema. No pallor.  Psychiatric: Thought content normal.    ED Course  Procedures (including critical care time)  CRITICAL CARE Performed by: Olivia Mackie   Total critical care time: 60 min  Critical care time was exclusive of separately billable procedures and treating other patients.  Critical care was necessary to treat or prevent imminent or life-threatening deterioration.  Critical care was time spent personally by me on the following activities: development of treatment plan with patient and/or surrogate as well as nursing, discussions with consultants, evaluation of patient's response to treatment, examination of patient, obtaining history from patient or surrogate, ordering and performing treatments and interventions, ordering and review of laboratory studies, ordering and review of radiographic studies, pulse oximetry and re-evaluation of patient's condition.   Labs Reviewed  CBC WITH DIFFERENTIAL - Abnormal; Notable for the following:    Neutro Abs 7.9 (*)     All other components within normal limits  BASIC METABOLIC PANEL - Abnormal; Notable for the following:    Sodium 130 (*)     Chloride 91 (*)     Glucose, Bld 147 (*)     BUN 44 (*)     Creatinine, Ser 4.08 (*)     GFR calc non Af Amer 13 (*)     GFR calc Af Amer 16 (*)     All other components within normal limits  POCT I-STAT 3, BLOOD GAS (G3+) - Abnormal; Notable for the following:    pH, Arterial 7.282 (*)     pCO2 arterial 64.4 (*)     pO2, Arterial 459.0 (*)     Bicarbonate 30.4 (*)     All other  components within normal limits  LACTIC ACID, PLASMA - Abnormal; Notable for the following:    Lactic Acid, Venous 2.6 (*)     All other components within normal limits  POCT I-STAT 3, BLOOD GAS (G3+) -  Abnormal; Notable for the following:    pH, Arterial 7.259 (*)     pCO2 arterial 61.6 (*)     Bicarbonate 27.6 (*)     All other components within normal limits  HEPATIC FUNCTION PANEL - Abnormal; Notable for the following:    Albumin 3.1 (*)     AST 6820 (*)  RESULTS CONFIRMED BY MANUAL DILUTION   ALT 3918 (*)     All other components within normal limits  PROCALCITONIN  CULTURE, BLOOD (ROUTINE X 2)  CULTURE, BLOOD (ROUTINE X 2)  CREATININE, URINE, RANDOM  NA AND K (SODIUM & POTASSIUM), RAND UR  MRSA PCR SCREENING  URINE CULTURE  URINALYSIS, ROUTINE W REFLEX MICROSCOPIC  CK  LIPASE, BLOOD  HEPATITIS PANEL, ACUTE  APTT  PROTIME-INR  LACTIC ACID, PLASMA  LACTIC ACID, PLASMA  LACTIC ACID, PLASMA  LACTIC ACID, PLASMA  COMPREHENSIVE METABOLIC PANEL  CBC  ACETAMINOPHEN LEVEL  URINE RAPID DRUG SCREEN (HOSP PERFORMED)   Dg Chest Port 1 View  12/25/2012  *RADIOLOGY REPORT*  Clinical Data: Hypoxia, shortness of breath.  PORTABLE CHEST - 1 VIEW  Comparison: 09/16/2011  Findings: Hypoaeration.  Heart size upper normal to mildly enlarged.  Mild bibasilar opacities.  No definite pleural effusion or pneumothorax.  No acute osseous finding.  IMPRESSION: Hypoaeration with mild bibasilar opacities; atelectasis versus early infiltrate.   Original Report Authenticated By: Jearld Lesch, M.D.      1. Cellulitis   2. Acute renal failure   3. Hypercarbia   4. Morbid obesity   5. Acute respiratory failure   6. Acute encephalopathy   7. Acute-on-chronic renal failure   8. Acute-on-chronic respiratory failure   9. Hyponatremia   10. Morbid obesity with BMI of 70 and over, adult   11. Obesity hypoventilation syndrome   12. OSA (obstructive sleep apnea)       MDM  32 year old female  with left leg cellulitis. Patient is failing outpatient management, and has hypersomnolence, hypoxia. Differential includes PE, sepsis, or pickwickian syndrome. I feel that given patient's body habitus, and noncompliance with her CPAP, the hypoxia is most likely due to to increased metabolic demand from her infection. Labs show new renal failure, as well as hypercarbia Will add on urinalysis, and urine electrolytes. Will discuss with hospitalist for admission.  Patient with persistent hypercarbia after an hour on BiPAP. Intensivist to admit.   Olivia Mackie, MD 12/25/12 [phone]

## 2012-12-25 NOTE — ED Notes (Addendum)
Report given to RN on 2100; no further questions/concerns from RN.  Informed RN that she can call back with any questions/concerns once patient arrives to floor.  Preparing patient for transport.

## 2012-12-25 NOTE — Progress Notes (Signed)
ABG drawn, results worse than previous ABG this am since being on BiPap all day. [facility] called, Dr. Frederico Hamman informed to leave on BiPAP at this time. Pt set RR increased, RT will continue to monitor.

## 2012-12-25 NOTE — H&P (Addendum)
PULMONARY  / CRITICAL CARE MEDICINE  Name: Alexandra Beck MRN: [id number] DOB: 12/09/1980    ADMISSION DATE:  12/25/2012 CONSULTATION DATE:  12/25/2012  REFERRING MD :  EDP  CHIEF COMPLAINT:  Acute respiratory failure  BRIEF PATIENT DESCRIPTION: 1 with morbid obesity, OSA/OHS (not on positive pressure therapy since one month ago due to machine malfunction), opioids and chronic respiratory failure who was brought to [facility] ED with altered mental statis and hypercarbia requiring BiPAP.  Labs reveled acute renal failure.  Of note she was on Losartan / HCTZ prior to admission and had decreased fluid intake for 24-48 h prior to coming to ED.  SIGNIFICANT EVENTS / STUDIES:  1/28  Admitted with acute on chronic respiratory failure requiring BiPAP and acute renal failure.  LINES / TUBES:  CULTURES: 1/28  Blood >>>  ANTIBIOTICS: Rocephin 1/27 x 1 Clindamycin 1/28 x 1 Vancomycin 1/28 >>> Zosyn 1/28 >>>  The patient is encephalopathic and unable to provide history, which was obtained for available medical records.  HISTORY OF PRESENT ILLNESS:  31 with morbid obesity, OSA/OHS (not on positive pressure therapy since one month ago due to machine malfunction), opioids and chronic respiratory failure who was brought to [facility] ED with altered mental statis and hypercarbia requiring BiPAP.  Labs reveled acute renal failure.  Of note she was on Losartan / HCTZ prior to admission and had decreased fluid intake for 24-48 h prior to coming to ED.  PAST MEDICAL HISTORY :  Past Medical History  Diagnosis Date  . Hypertension   . Apnea   . CHF (congestive heart failure)   . Morbid obesity   . Hyperlipidemia     Per patient  . Prediabetes     Per patient   History reviewed. No pertinent past surgical history. Prior to Admission medications   Medication Sig Start Date End Date Taking? Authorizing Provider  clindamycin (CLEOCIN) 150 MG capsule Take 150 mg by mouth every 6 (six) hours.   Yes Historical  Provider, MD  levothyroxine (SYNTHROID, LEVOTHROID) 150 MCG tablet Take 150 mcg by mouth daily.   Yes Historical Provider, MD  losartan-hydrochlorothiazide (HYZAAR) 100-25 MG per tablet Take 1 tablet by mouth daily.   Yes Historical Provider, MD  metFORMIN (GLUCOPHAGE) 500 MG tablet Take 500 mg by mouth daily with breakfast.    Yes Historical Provider, MD  metoprolol succinate (TOPROL-XL) 100 MG 24 hr tablet Take 100 mg by mouth daily. Take with or immediately following a meal.   Yes Historical Provider, MD  oxyCODONE-acetaminophen (PERCOCET/ROXICET) 5-325 MG per tablet Take 1-2 tablets by mouth every 4 (four) hours as needed. For pain   Yes Historical Provider, MD  promethazine (PHENERGAN) 25 MG tablet Take 25 mg by mouth every 6 (six) hours as needed. For nausea   Yes Historical Provider, MD  rosuvastatin (CRESTOR) 10 MG tablet Take 10 mg by mouth daily.   Yes Historical Provider, MD   Allergies  Allergen Reactions  . Aspirin     REACTION: throat swelling, hives  . Doxycycline Other (See Comments)    Abdominal pain  . Niaspan (Niacin Er)     Caused flushing    FAMILY HISTORY:  History reviewed. No pertinent family history.  SOCIAL HISTORY:  reports that she has never smoked. She does not have any smokeless tobacco history on file. She reports that she does not drink alcohol or use illicit drugs.  REVIEW OF SYSTEMS:  Unable to provide.  INTERVAL HISTORY:  VITAL SIGNS: Temp:  [  101 F (38.3 C)] 101 F (38.3 C) (01/28 0027) Pulse Rate:  [86-93] 86  (01/28 0045) Resp:  [16-22] 16  (01/28 0227) BP: (89-103)/(38-46) 89/43 mmHg (01/28 0227) SpO2:  [70 %-100 %] 97 % (01/28 0227) FiO2 (%):  [20 %] 20 % (01/28 0227)  HEMODYNAMICS:    VENTILATOR SETTINGS: Vent Mode:  [-]  FiO2 (%):  [20 %] 20 %  INTAKE / OUTPUT: Intake/Output    None    PHYSICAL EXAMINATION: General:  Morbidly obese, mechanically ventilated with BiAPP Neuro:  Encephalopathic, nonfocal, cough / gag  diminished HEENT:  Dry membranes Cardiovascular:  RRR, no m/r/g Lungs:  Bilateral diminished air entry, no w/r/r Abdomen:  Obese, soft Musculoskeletal:  Moves all extremities, LLE tender to touch, erythematous, edematous Skin:  Intact  LABS:  Lab 12/25/12 0306 12/25/12 0223 12/25/12 0222 12/25/12 0129 12/25/12 0126 12/24/12 0024 12/23/12 2054  HGB -- -- -- -- 12.7 14.3 11.8*  WBC -- -- -- -- 10.3 -- 9.8  PLT -- -- -- -- 152 -- --  NA -- -- -- -- 130* 134* --  K -- -- -- -- 4.9 5.0 --  CL -- -- -- -- 91* 100 --  CO2 -- -- -- -- 26 -- --  GLUCOSE -- -- -- -- 147* 134* --  BUN -- -- -- -- 44* 24* --  CREATININE -- -- -- -- 4.08* 1.20* --  CALCIUM -- -- -- -- 8.8 -- --  MG -- -- -- -- -- -- --  PHOS -- -- -- -- -- -- --  AST -- -- -- -- -- -- --  ALT -- -- -- -- -- -- --  ALKPHOS -- -- -- -- -- -- --  BILITOT -- -- -- -- -- -- --  PROT -- -- -- -- -- -- --  ALBUMIN -- -- -- -- -- -- --  APTT -- -- -- -- -- -- --  INR -- -- -- -- -- -- --  LATICACIDVEN -- -- 2.6* -- -- -- --  TROPONINI -- -- -- -- -- -- --  PROCALCITON -- 27.01 -- -- -- -- --  PROBNP -- -- -- -- -- -- --  O2SATVEN -- -- -- -- -- -- --  PHART 7.259* -- -- 7.282* -- -- --  PCO2ART 61.6* -- -- 64.4* -- -- --  PO2ART 93.0 -- -- 459.0* -- -- --   No results found for this basename: GLUCAP:5 in the last 168 hours  CXR:  1/28 >>> Hypoinflation, no overt airspace disease  ASSESSMENT / PLAN:  PULMONARY A:  Acute on chronic respiratory failure in setting of OSA/OHS, CPAP/BiPAP noncompliance, opioids. P:   Gaol SpO2>92, pH>7.28 Full mechanical support with BiPAP Would attempt to avoid intubation, which is likely to be difficult due to morbid obesity / airway anatomy Trend ABG / CXR  CARDIOVASCULAR A: Hypotensive, possible measurement issue related to morbid obesity.  SIRS / Sepsis. Marginally elevated lactate.  Baseline HTN and systolic / diastolic CHF without evidence of exacerbation. Dyslipidemia. P:  Goal  MAP>60 Hold Lopressor, Losartan, HCTZ Crestor when able May need CVL  RENAL A:  Acute on chronic renal failure.  Hypovolemia.  Hyponatremia. P:   Trend BMP NS 1000 x 1 NS@150   GASTROINTESTINAL A:  No active issues. P:   NPO GI Px is not indicated Check LFTs  HEMATOLOGIC A:  No active issues. P:  Trend CBC Heparin for DVT Px  INFECTIOUS A:  Cellulitis LLE.  Suspected  DVT. P:   Cultures and antibiotics as above PCT Venous Doppler  ENDOCRINE  A:  Suspected DM.  Hyperglycemia. Hypothyroidism. P:   ICU Glycemic Control Protocol, Phase 1 Hold Metformin  NEUROLOGIC A:  Acute encephalopathy (septic/methabolic) P:   Goal RASS 0 to -1 Hold opioids Synthroid when able.  TODAY'S SUMMARY: 29 with morbid obesity, OSA/OHS (not on positive pressure therapy since one month ago due to machine malfunction), opioids and chronic respiratory failure who was brought to [facility] ED with altered mental statis and hypercarbia requiring BiPAP.  Labs reveled acute renal failure.  Of note she was on Losartan / HCTZ prior to admission and had decreased fluid intake for 24-48 h prior to coming to ED.  BiPAP / fluids / antibiotics tonight.  Venous Doppler pending.  Friend's contact # [phone].  I have personally obtained a history, examined the patient, evaluated laboratory and imaging results, formulated the assessment and plan and placed orders.  CRITICAL CARE:  The patient is critically ill with multiple organ systems failure and requires high complexity decision making for assessment and support, frequent evaluation and titration of therapies, application of advanced monitoring technologies and extensive interpretation of multiple databases. Critical Care Time devoted to patient care services described in this note is 40 minutes.   Lonia Farber, MD  Pulmonary and Critical Care Medicine [facility] Pager: [phone]  12/25/2012, 4:04 AM

## 2012-12-25 NOTE — ED Notes (Addendum)
Presents with fever of 101.0, lethargy, confusion, confusion began this am.. Has not worn CPAP in over 2 weeks, O2 sats 70%. Left leg redness to calf and left foot pale. Pt placed on Non rebreathing with increase to 100% Sats

## 2012-12-25 NOTE — Procedures (Signed)
Central Venous Catheter Insertion Procedure Note CAM HARNDEN [id number] 1981/01/08  Procedure: Insertion of Central Venous Catheter Indications: Assessment of intravascular volume, Drug and/or fluid administration and Frequent blood sampling  Procedure Details Consent: Risks of procedure as well as the alternatives and risks of each were explained to the (patient/caregiver).  Consent for procedure obtained. Time Out: Verified patient identification, verified procedure, site/side was marked, verified correct patient position, special equipment/implants available, medications/allergies/relevent history reviewed, required imaging and test results available.  Performed  Maximum sterile technique was used including antiseptics, cap, gloves, gown, hand hygiene, mask, sheet and . Skin prep: Chlorhexidine; local anesthetic administered A antimicrobial bonded/coated triple lumen catheter was placed in the right internal jugular vein using the Seldinger technique.  Evaluation Blood flow good Complications: No apparent complications Patient did tolerate procedure well. Chest X-ray ordered to verify placement.  CXR: pending.  Nelda Bucks 12/25/2012, 8:53 AM  [place] gud[facility] Emergent as well  Mcarthur Rossetti. Tyson Alias, MD, FACP Pgr: [phone] [facility]

## 2012-12-25 NOTE — ED Notes (Signed)
Patient currently asleep in bed on BiPAP; no respiratory or acute distress noted.  Patient's family updated on plan of care; informed patient that we are currently waiting on bed assignment; denies needs at this time; will continue to monitor.

## 2012-12-25 NOTE — ED Notes (Addendum)
Patient received IM injection of antibiotic yesterday because intravenous access could not be obtained with doppler.

## 2012-12-25 NOTE — ED Notes (Signed)
Dr. Norlene Campbell at bedside assessing patient; RN at bedside attempting IV access; phlebotomy at bedside collecting lab work.

## 2012-12-25 NOTE — ED Notes (Signed)
Patient currently resting quietly in bed on BiPAP; no respiratory or acute distress noted.  Patient's family updated on plan of care; informed family that EDP has put in consult for hospitalist.  Family denies any needs at this time; will continue to monitor.

## 2012-12-25 NOTE — Progress Notes (Signed)
Utilization Review Completed.Alexandra Beck T12/06/2013  

## 2012-12-26 ENCOUNTER — Inpatient Hospital Stay (HOSPITAL_COMMUNITY): Payer: BC Managed Care – PPO

## 2012-12-26 DIAGNOSIS — J9600 Acute respiratory failure, unspecified whether with hypoxia or hypercapnia: Secondary | ICD-10-CM

## 2012-12-26 LAB — COMPREHENSIVE METABOLIC PANEL
Albumin: 2.7 g/dL — ABNORMAL LOW (ref 3.5–5.2)
Alkaline Phosphatase: 68 U/L (ref 39–117)
BUN: 63 mg/dL — ABNORMAL HIGH (ref 6–23)
Calcium: 7.6 mg/dL — ABNORMAL LOW (ref 8.4–10.5)
GFR calc Af Amer: 9 mL/min — ABNORMAL LOW (ref >90)
Glucose, Bld: 98 mg/dL (ref 70–99)
Potassium: 4.9 mEq/L (ref 3.5–5.1)
Total Protein: 6.1 g/dL (ref 6.0–8.3)

## 2012-12-26 LAB — BASIC METABOLIC PANEL
BUN: 67 mg/dL — ABNORMAL HIGH (ref 6–23)
BUN: 70 mg/dL — ABNORMAL HIGH (ref 6–23)
BUN: 72 mg/dL — ABNORMAL HIGH (ref 6–23)
CO2: 25 mEq/L (ref 19–32)
CO2: 23 mEq/L (ref 19–32)
Chloride: 99 mEq/L (ref 96–112)
Chloride: 102 mEq/L (ref 96–112)
Creatinine, Ser: 6.84 mg/dL — ABNORMAL HIGH (ref 0.50–1.10)
Creatinine, Ser: 7.09 mg/dL — ABNORMAL HIGH (ref 0.50–1.10)
Creatinine, Ser: 7.62 mg/dL — ABNORMAL HIGH (ref 0.50–1.10)
GFR calc Af Amer: 10 mL/min — ABNORMAL LOW (ref >90)
GFR calc Af Amer: 7 mL/min — ABNORMAL LOW (ref >90)
GFR calc non Af Amer: 7 mL/min — ABNORMAL LOW (ref >90)
GFR calc non Af Amer: 7 mL/min — ABNORMAL LOW (ref >90)
Glucose, Bld: 96 mg/dL (ref 70–99)
Glucose, Bld: 88 mg/dL (ref 70–99)
Potassium: 4.8 mEq/L (ref 3.5–5.1)
Potassium: 4.9 mEq/L (ref 3.5–5.1)
Sodium: 137 mEq/L (ref 135–145)

## 2012-12-26 LAB — CBC
HCT: 32.9 % — ABNORMAL LOW (ref 36.0–46.0)
Hemoglobin: 10.8 g/dL — ABNORMAL LOW (ref 12.0–15.0)
MCH: 29 pg (ref 26.0–34.0)
MCHC: 32.8 g/dL (ref 30.0–36.0)
RDW: 14.5 % (ref 11.5–15.5)

## 2012-12-26 LAB — BLOOD GAS, ARTERIAL
Acid-base deficit: 4.6 mmol/L — ABNORMAL HIGH (ref 0.0–2.0)
Bicarbonate: 21.4 mEq/L (ref 20.0–24.0)
Delivery systems: POSITIVE
FIO2: 0.3 %
Mode: POSITIVE
O2 Saturation: 98.2 %
Patient temperature: 98.6
RATE: 14 resp/min
TCO2: 23 mmol/L (ref 0–100)
pH, Arterial: 7.252 — ABNORMAL LOW (ref 7.350–7.450)

## 2012-12-26 LAB — GLUCOSE, CAPILLARY

## 2012-12-26 LAB — URINE CULTURE: Colony Count: NO GROWTH

## 2012-12-26 LAB — CK
Total CK: 1683 U/L — ABNORMAL HIGH (ref 7–177)
Total CK: 1480 U/L — ABNORMAL HIGH (ref 7–177)

## 2012-12-26 LAB — FACTOR 8 ASSAY: Coagulation Factor VIII: 239 % — ABNORMAL HIGH (ref 73–140)

## 2012-12-26 LAB — LACTIC ACID, PLASMA: Lactic Acid, Venous: 0.7 mmol/L (ref 0.5–2.2)

## 2012-12-26 LAB — HEPATITIS PANEL, ACUTE
HCV Ab: NEGATIVE
Hep A IgM: NEGATIVE

## 2012-12-26 MED ORDER — LEVOTHYROXINE SODIUM 100 MCG IV SOLR
75.0000 ug | Freq: Every day | INTRAVENOUS | Status: DC
  Administered 2012-12-26 – 2012-12-28 (×3): 76 ug via INTRAVENOUS
  Filled 2012-12-26 (×3): qty 3.8

## 2012-12-26 MED ORDER — MAGNESIUM SULFATE 40 MG/ML IJ SOLN
2.0000 g | Freq: Once | INTRAMUSCULAR | Status: AC
  Administered 2012-12-26: 2 g via INTRAVENOUS
  Filled 2012-12-26: qty 50

## 2012-12-26 MED ORDER — PIPERACILLIN-TAZOBACTAM IN DEX 2-0.25 GM/50ML IV SOLN
2.2500 g | Freq: Three times a day (TID) | INTRAVENOUS | Status: DC
  Administered 2012-12-26 – 2012-12-31 (×15): 2.25 g via INTRAVENOUS
  Filled 2012-12-26 (×18): qty 50

## 2012-12-26 MED ORDER — FUROSEMIDE 10 MG/ML IJ SOLN
80.0000 mg | Freq: Once | INTRAMUSCULAR | Status: AC
  Administered 2012-12-26: 80 mg via INTRAVENOUS
  Filled 2012-12-26: qty 8

## 2012-12-26 MED ORDER — SODIUM CHLORIDE 0.9 % IV SOLN
2000.0000 mg | INTRAVENOUS | Status: DC
  Administered 2012-12-26: 2000 mg via INTRAVENOUS
  Filled 2012-12-26: qty 2000

## 2012-12-26 MED ORDER — BIOTENE DRY MOUTH MT LIQD: 15.0000 mL | Freq: Two times a day (BID) | OROMUCOSAL | Status: DC

## 2012-12-26 MED ORDER — CHLORHEXIDINE GLUCONATE 0.12 % MT SOLN: 15.0000 mL | Freq: Two times a day (BID) | OROMUCOSAL | Status: DC

## 2012-12-26 MED ORDER — CHLORHEXIDINE GLUCONATE 0.12 % MT SOLN
15.0000 mL | Freq: Two times a day (BID) | OROMUCOSAL | Status: DC
  Administered 2012-12-26 – 2012-12-27 (×3): 15 mL via OROMUCOSAL
  Filled 2012-12-26 (×5): qty 15

## 2012-12-26 MED ORDER — FUROSEMIDE 10 MG/ML IJ SOLN
80.0000 mg | Freq: Three times a day (TID) | INTRAMUSCULAR | Status: DC
  Administered 2012-12-26 – 2012-12-28 (×5): 80 mg via INTRAVENOUS
  Filled 2012-12-26 (×8): qty 8

## 2012-12-26 MED ORDER — BIOTENE DRY MOUTH MT LIQD
15.0000 mL | Freq: Two times a day (BID) | OROMUCOSAL | Status: DC
  Administered 2012-12-26 (×2): 15 mL via OROMUCOSAL

## 2012-12-26 NOTE — Progress Notes (Signed)
PULMONARY  / CRITICAL CARE MEDICINE  Name: Alexandra Beck MRN: [id number] DOB: 10-27-1981    ADMISSION DATE:  12/25/2012 CONSULTATION DATE:  12/25/2012  REFERRING MD :  EDP  CHIEF COMPLAINT:  Acute respiratory failure  BRIEF PATIENT DESCRIPTION: 32yo F with morbid obesity, OSA/OHS (not on positive pressure therapy for past month due to broken machine), opioids and chronic respiratory failure, and h/o cellulitis requiring IV abx was brought to [facility] ED with altered mental status and hypercarbia requiring BiPAP.  Labs reveled acute renal failure.  Of note she was on Losartan / HCTZ prior to admission and had decreased fluid intake for 24-48 h prior to coming to ED.  SIGNIFICANT EVENTS / STUDIES:  1/28  Admitted with acute on chronic respiratory failure requiring BiPAP and acute renal failure; placed on bipap; no IV access, RIJ placed; worsening LLE cellulitis. 1/28 Elevated AST/ALT, ARF, per Ortho no compartment syndrome, CT shows cellulitis only, BLE dopplers neg. 1/28-1/29 Low uop overnight. Worsening Cr.  LINES / TUBES: 1/28 RIJ >> 1/28 bipap  CULTURES: 1/28  Blood Cx x2 >>> 1/28 Urine Cx >> Negative 1/28 Hepatitis panel >>  ANTIBIOTICS: Rocephin 1/27 x 1 Clindamycin 1/28 >> Vancomycin 1/28 >>> Zosyn 1/28 >>>  INTERVAL HISTORY: 1/28  Admitted with acute on chronic respiratory failure requiring BiPAP and acute renal failure; placed on bipap; no IV access, RIJ placed; worsening LLE cellulitis, rhabdo - CK >2000. 1/29  Improved CK and LFTs, worsening renal fx  VITAL SIGNS: Temp:  [98.4 F (36.9 C)-100.2 F (37.9 C)] 99 F (37.2 C) (01/29 0444) Pulse Rate:  [69-76] 70  (01/29 0500) Resp:  [12-18] 13  (01/29 0500) BP: (101-129)/(51-75) 107/67 mmHg (01/29 0500) SpO2:  [91 %-99 %] 93 % (01/29 0500) FiO2 (%):  [30 %] 30 % (01/29 0400)  HEMODYNAMICS:   VENTILATOR SETTINGS: Vent Mode:  [-]  FiO2 (%):  [30 %] 30 %  INTAKE / OUTPUT: Intake/Output      01/28 0701 - 01/29 0700   I.V. (mL/kg) 1925 (10.5)   IV Piggyback 250   Total Intake(mL/kg) 2175 (11.9)   Urine (mL/kg/hr) 415 (0.1)   Total Output 415   Net +1760        PHYSICAL EXAMINATION: General:  Morbidly obese, mechanically ventilated with BiAPP Neuro:  Alert and oriented HEENT:  Dry membranes Cardiovascular:  RRR, no m/r/g Lungs:  Bilateral diminished air entry, no w/r/r Abdomen:  Obese, soft, NT Musculoskeletal:  Moves all extremities, LLE tender to touch, improvement of erythema in LLE Skin:  Intact with LLE cellulitis  LABS:  Lab 12/26/12 0452 12/26/12 0450 12/25/12 2300 12/25/12 1830 12/25/12 1700 12/25/12 1644 12/25/12 1478 12/25/12 0620 12/25/12 0223 12/25/12 0126 12/24/12 0024 12/23/12 2054  HGB -- 10.8* -- -- -- -- -- -- -- 12.7 14.3 --  WBC -- 8.1 -- -- -- -- -- -- -- 10.3 -- 9.8  PLT -- 119* -- -- -- -- -- -- -- 152 -- --  NA -- 137 137 -- 134* -- -- -- -- -- -- --  K -- 4.9 5.0 -- -- -- -- -- -- -- -- --  CL -- 98 99 -- 97 -- -- -- -- -- -- --  CO2 -- 24 25 -- 25 -- -- -- -- -- -- --  GLUCOSE -- 98 101* -- 100* -- -- -- -- -- -- --  BUN -- 63* 60* -- 56* -- -- -- -- -- -- --  CREATININE -- 6.34* 5.70* -- 5.08* -- -- -- -- -- -- --  CALCIUM -- 7.6* 7.6* -- 7.8* -- -- -- -- -- -- --  MG -- 1.9 -- -- -- -- -- -- -- -- -- --  PHOS -- 6.8* -- -- -- -- -- -- -- -- -- --  AST -- 1979* -- -- -- -- -- -- 6820* -- -- --  ALT -- 2486* -- -- -- -- -- -- 3918* -- -- --  ALKPHOS -- 68 -- -- -- -- -- -- 55 -- -- --  BILITOT -- 0.6 -- -- -- -- -- -- 0.8 -- -- --  PROT -- 6.1 -- -- -- -- -- -- 7.2 -- -- --  ALBUMIN -- 2.7* -- -- -- -- -- -- 3.1* -- -- --  APTT -- -- -- -- -- -- -- 32 -- -- -- --  INR -- -- -- -- -- -- -- 1.70* -- -- -- --  LATICACIDVEN -- -- 0.7 0.8 -- -- -- 1.7 -- -- -- --  TROPONINI -- -- -- -- -- -- -- -- -- -- -- --  PROCALCITON -- -- -- -- -- -- -- -- 27.01 -- -- --  PROBNP -- -- -- -- -- -- -- -- -- -- -- --  O2SATVEN -- -- -- -- -- -- -- -- -- -- -- --  PHART 7.252* --  -- -- -- 7.279* 7.303* -- -- -- -- --  PCO2ART 50.4* -- -- -- -- 55.2* 52.6* -- -- -- -- --  PO2ART 117.0* -- -- -- -- 66.0* 109.0* -- -- -- -- --    Lab 12/26/12 0442 12/25/12 2337 12/25/12 2042 12/25/12 1626 12/25/12 0824  GLUCAP 97 86 93 104* 110*    IMAGING: Ct Femur Left Wo Contrast  12/25/2012  *RADIOLOGY REPORT*  Clinical Data: Pain and swelling.  CT OF THE LEFT FEMUR WITHOUT CONTRAST,CT TIBIA FIBULA LEFT WITHOUT CONTRAST  Comparison: None.  Findings: Mild diffuse cellulitis involving the left lower extremity most notably in the mid distal tibia / fibula region.  No discrete soft tissue abscess or findings for myofasciitis.  The bony structures are intact.  No findings to suggest septic arthritis or osteomyelitis.  Slightly enlarged / inflamed left inguinal lymph nodes are noted.  IMPRESSION: Cellulitis without findings for focal soft tissue abscess, myofasciitis, septic arthritis or osteomyelitis.   Original Report Authenticated By: Rudie Meyer, M.D.    Ct Tibia Fibula Left Wo Contrast  12/25/2012  *RADIOLOGY REPORT*  Clinical Data: Pain and swelling.  CT OF THE LEFT FEMUR WITHOUT CONTRAST,CT TIBIA FIBULA LEFT WITHOUT CONTRAST  Comparison: None.  Findings: Mild diffuse cellulitis involving the left lower extremity most notably in the mid distal tibia / fibula region.  No discrete soft tissue abscess or findings for myofasciitis.  The bony structures are intact.  No findings to suggest septic arthritis or osteomyelitis.  Slightly enlarged / inflamed left inguinal lymph nodes are noted.  IMPRESSION: Cellulitis without findings for focal soft tissue abscess, myofasciitis, septic arthritis or osteomyelitis.   Original Report Authenticated By: Rudie Meyer, M.D.    US Renal Port  12/25/2012  *RADIOLOGY REPORT*  Clinical Data: Worsening renal function.  RENAL/URINARY TRACT ULTRASOUND COMPLETE  Comparison:  MRI 12/28/2005  Findings:  Right Kidney:  12.0 cm. Normal size and echotexture.  No focal  abnormality.  No hydronephrosis.  Left Kidney:  12.8 cm. Normal size and echotexture.  No focal abnormality.  No hydronephrosis.  Bladder:  Not visualized.  IMPRESSION: No acute renal abnormality.  No hydronephrosis.  Nonvisualization of  the bladder.   Original Report Authenticated By: Charlett Nose, M.D.    Dg Chest Port 1 View  12/26/2012  *RADIOLOGY REPORT*  Clinical Data: Shortness of breath.  Respiratory failure.  PORTABLE CHEST - 1 VIEW  Comparison: 12/25/2012  Findings: Central line tip is in the superior vena cava in good position.  There is new slight pulmonary vascular congestion.  No consolidative infiltrates or effusions.  The heart size is normal.  IMPRESSION: New pulmonary vascular congestion.   Original Report Authenticated By: Francene Boyers, M.D.    Dg Chest Port 1 View  12/25/2012  *RADIOLOGY REPORT*  Clinical Data: Central line placement  PORTABLE CHEST - 1 VIEW  Comparison: Prior chest x-ray performed earlier today, 12/25/2012 at 01:23 a.m.  Findings: Interval placement of a right IJ central venous catheter. The tip projects over the superior cavoatrial junction.  No evidence of complicating pneumothorax.  Inspiratory volumes are low and there is increased bibasilar atelectasis.  Stable cardiomegaly. No acute osseous abnormality.  Mild pulmonary vascular congestion.  IMPRESSION:  1.  Interval placement of a right IJ central venous catheter without evidence of complicating pneumothorax.  The tip of the catheter projects over the superior cavoatrial junction. 2.  Lower inspiratory volumes with increased bibasilar atelectasis 3.  Mild pulmonary vascular congestion   Original Report Authenticated By: Malachy Moan, M.D.    Dg Chest Port 1 View  12/25/2012  *RADIOLOGY REPORT*  Clinical Data: Hypoxia, shortness of breath.  PORTABLE CHEST - 1 VIEW  Comparison: 09/16/2011  Findings: Hypoaeration.  Heart size upper normal to mildly enlarged.  Mild bibasilar opacities.  No definite pleural  effusion or pneumothorax.  No acute osseous finding.  IMPRESSION: Hypoaeration with mild bibasilar opacities; atelectasis versus early infiltrate.   Original Report Authenticated By: Jearld Lesch, M.D.     ASSESSMENT / PLAN:  PULMONARY A:   Acute on chronic respiratory failure in setting of OSA/OHS, CPAP/BiPAP noncompliance, opioids. Worsening pulmonary edema on CXR P:   Full mechanical support with BiPAP, goal 4 hours on 2 hours off, she must have interruption, if fails - ETT Would attempt to avoid intubation, unsure Trend ABG in pm AM CXR  Upright position See renal, may need lasix, if pcxr edema as well, cvp now 20, kvo  CARDIOVASCULAR A:  Hypotension, SIRS/Sepsis; not requiring pressors Hypovolemia Marginally elevated lactate on admission, improved   H/o HTN  H/o systolic/diastolic CHF without evidence of exacerbation, last ECHO on record from 09/2009.  H/o dyslipidemia. RIJ placed, concern now edema, cvp up R/o pulm htn P:  Goal MAP>60 NS@175 , will have to monitor for overload given h/o CHF She will likely need repeat ECHO one more stable Holding Lopressor, Losartan, HCTZ Crestor when able Checking CVPs-last 20, kvo, lasix consideration Low threshold levophed and EGDT, not needed as of now  RENAL A:  Acute on chronic renal failure, worsening   Hypovolemia.   Hyponatremia, resolved. Rhabdo, CK trending downward Renal u/s normal pulm edema? P:   Trend BMP q6h Trend CK q12 NS@175  to kvo May need lasix CVPs Renal consult and appreciated  GASTROINTESTINAL A:   LFTs significantly elevated, shock liver unlikely as not on pressors, most worried for ischemic tissue; LFTs trending downward Salicylates and Acetaminophen levels normal P:   NPO GI PPx (sepsis risk) Follow LFTs, cpk Volume Acute hep panel pending  HEMATOLOGIC A:  No leukocytosis, WBC trending downward, coagulapthy (liver, group a strep sepsis?), Elevated d-dimer and fibrinogen, DIC  unlikely BLE doppler neg for DVT  Drop in plat , likely sepsis P:  Trend CBC Factor 8 assay pending No role ffp at this stage No hep exposure pre ICU STAY  INFECTIOUS A:   Cellulitis LLE- h/o cellulitis req IV abx. No DVT per doppler, no compartment syndrome per Ortho, CT LLE with cellulitis only, ?group a strep as organisms Cellulitis improving I am fully convinced she has too some degree ischemic tissue, muscle? R/o nec fasc P:   F/u cultures Vanc, Zosyn, and Clindamycin (toxin inhibition) - maintain Ct reviewed, improved clinically to some degree Appreciate Ortho support  ENDOCRINE  A:   Suspected DM.   Hyperglycemia.  Hypothyroidism, TSH wnl P:   ICU Glycemic Control Protocol, Phase 1 Hold Metformin, do not restart Hemoglobin A1c IV synthroid addition home dose  NEUROLOGIC A:   Acute encephalopathy (septic/methabolic) P:   RASS -1, goal 0 Holding opioids  CLINICAL SUMMARY: 32yo F with morbid obesity, OSA/OHS (not on positive pressure therapy for past month due to broken machine), opioids and chronic respiratory failure, and h/o cellulitis requiring IV abx was brought to [facility] ED with altered mental status and hypercarbia requiring BiPAP.  Labs reveled acute renal failure.  Of note she was on Losartan / HCTZ prior to admission and had decreased fluid intake for 24-48 h prior to coming to ED. She was placed on bipap; a RIJ was placed; worsening LLE cellulitis, no DVT; hydrating with IVF. ? group a strep as organism. Concern edema, kvo, renal consult, may need lasix  Friend's contact # [phone].  I have personally obtained a history, examined the patient, evaluated laboratory and imaging results, formulated the assessment and plan and placed orders.  CRITICAL CARE:  The patient is critically ill with multiple organ systems failure and requires high complexity decision making for assessment and support, frequent evaluation and titration of therapies, application of  advanced monitoring technologies and extensive interpretation of multiple databases. Critical Care Time devoted to patient care services described in this note is 35 minutes.   Genelle Gather, MD  Pulmonary and Critical Care Medicine [facility] Pager: [phone]  12/26/2012, 6:26 AM  Mcarthur Rossetti. Tyson Alias, MD, FACP Pgr: [phone] [facility]

## 2012-12-26 NOTE — Consult Note (Signed)
[facility] Consult Note    Date: 12/26/2012                  Patient Name:  Alexandra Beck  MRN: [id number]  DOB: May 31, 1981  Age / Sex: 32 y.o., female         PCP: Geraldo Pitter, MD                 Service Requesting Consult: CCM                 Reason for Consult: Acute Kidney Failure            History of Present Illness: Patient is a 32 y.o. female with a PMHx of morbid obesity, OSA/OHS (not on CPAP at home) who was admitted to [facility] on 12/25/2012 for evaluation of altered mental status, hypercarbia requiring BiPap and Acute renal failure.   Patient reports noticing onset of cellulitis in left leg, associated with pain in the leg, fevers and chills. She was evaluated at Urgent Care where she had been given clindamycin for the infection. The day prior to admission, she reports falling asleep and not waking up for over 12 hours, at which point she was woken up by her partner and mother who brought her to the ED. At that time, she was confused and drowsy and found to be in hypercarbic respiratory failure and started on BiPap. She was also found to have a BP of 87/43 and given fluids.   Prior to admission, she reports having some nausea, no vomiting. Reports fever of 102. 9 and chills. She doesn't think she was eating or drinking much. She was taking her BP meds: losartan/HCTZ and metoprolol. Denies any NSAID use.   Creatinine on admission was 1.20 on istat and 4.08 on repeat BMP a couple of hours later. CK was elevated at 2125. Urine showed small Hg, 0-2RBC, 15 ket, 100 protein. Renal US did not show any hydronephrosis. Today, Creatinine rose to 6.34. Patient denies any history of renal disease.  Currently she says she feels better, O2 sat 99 on 4 liters [place].  Gave her 80 of IV lasix and did appear to have some response.   PCP: Dr. Parke Simmers  Medications: Outpatient medications: Prescriptions prior to admission  Medication Sig Dispense Refill  . clindamycin (CLEOCIN) 150 MG  capsule Take 150 mg by mouth every 6 (six) hours.      Marland Kitchen levothyroxine (SYNTHROID, LEVOTHROID) 150 MCG tablet Take 150 mcg by mouth daily.      Marland Kitchen losartan-hydrochlorothiazide (HYZAAR) 100-25 MG per tablet Take 1 tablet by mouth daily.      . metFORMIN (GLUCOPHAGE) 500 MG tablet Take 500 mg by mouth daily with breakfast.       . metoprolol succinate (TOPROL-XL) 100 MG 24 hr tablet Take 100 mg by mouth daily. Take with or immediately following a meal.      . oxyCODONE-acetaminophen (PERCOCET/ROXICET) 5-325 MG per tablet Take 1-2 tablets by mouth every 4 (four) hours as needed. For pain      . promethazine (PHENERGAN) 25 MG tablet Take 25 mg by mouth every 6 (six) hours as needed. For nausea      . rosuvastatin (CRESTOR) 10 MG tablet Take 10 mg by mouth daily.        Current medications: Current Facility-Administered Medications  Medication Dose Route Frequency Provider Last Rate Last Dose  . antiseptic oral rinse (BIOTENE) solution 15 mL  15 mL Mouth Rinse q12n4p Darden [name],  MD   15 mL at 12/26/12 1200  . chlorhexidine (PERIDEX) 0.12 % solution 15 mL  15 mL Mouth Rinse BID Darden [name], MD   15 mL at 12/26/12 0809  . clindamycin (CLEOCIN) IVPB 900 mg  900 mg Intravenous Q6H Darden [name], MD   900 mg at 12/26/12 1128  . insulin aspart (novoLOG) injection 2-6 Units  2-6 Units Subcutaneous Q4H Konstantin Zubelevitskiy, MD      . levothyroxine (SYNTHROID, LEVOTHROID) injection 76 mcg  76 mcg Intravenous Daily Darden [name], MD   76 mcg at 12/26/12 1128  . piperacillin-tazobactam (ZOSYN) IVPB 2.25 g  2.25 g Intravenous Q8H Nelda Bucks, MD      . vancomycin [facility]) 2,000 mg in sodium chloride 0.9 % 500 mL IVPB  2,000 mg Intravenous Q48H Nelda Bucks, MD       Facility-Administered Medications Ordered in Other Encounters  Medication Dose Route Frequency Provider Last Rate Last Dose  . cefTRIAXone (ROCEPHIN) injection 1 g  1 g Intramuscular Once Jonita Albee, MD      .  HYDROcodone-acetaminophen (LORTAB) 7.5-500 MG/15ML solution 15 mL  15 mL Oral Once Morrell Riddle, PA-C          Allergies: Allergies  Allergen Reactions  . Aspirin     REACTION: throat swelling, hives  . Doxycycline Other (See Comments)    Abdominal pain  . Niaspan (Niacin Er)     Caused flushing      Past Medical History: Past Medical History  Diagnosis Date  . Hypertension   . Apnea   . CHF (congestive heart failure)   . Morbid obesity   . Hyperlipidemia     Per patient  . Prediabetes     Per patient     Past Surgical History: History reviewed. No pertinent past surgical history.   Family History: No history of kidney disease.   Social History: Smoking: none Alcohol: none Illicits: none  Review of Systems: As per HPI  Vital Signs: Blood pressure 105/55, pulse 64, temperature 98.3 F (36.8 C), temperature source Oral, resp. rate 13, height 5' 1.81" (1.57 m), weight 402 lb 9 oz (182.6 kg), last menstrual period 12/03/2012, SpO2 96.00%.  Weight trends: Filed Weights   12/25/12 0300 12/25/12 0500 12/26/12 0500  Weight: 386 lb (175.088 kg) 404 lb 1.7 oz (183.3 kg) 402 lb 9 oz (182.6 kg)    Physical Exam: General: Vital signs reviewed and noted. Morbidly obese. In no acute distress. On 4L [place]  Head: Normocephalic, atraumatic.  Eyes: PERRL, EOMI  Nose: Mucous membranes moist  Throat: Oropharynx nonerythematous  Neck: No deformities, masses, or tenderness noted.Supple, JVD difficult to assess given neck size. Right IJ in place (placed 12/25/12)  Lungs:  Normal respiratory effort. Clear to auscultation BL on anterior exam  Heart: RRR. S1 and S2 normal without gallop, murmur, or rubs.  Abdomen:  BS normoactive. Soft, Nondistended, non-tender.  No masses or organomegaly.  Extremities: No pretibial edema. Left leg erythema  Neurologic: A&O X3, CN II - XII are grossly intact.   Skin: Left leg erythema and warmth    Lab results: Basic Metabolic Panel:  Lab  12/26/12 1100 12/26/12 0450 12/25/12 2300  NA 137 137 137  K 4.8 4.9 5.0  CL 97 98 99  CO2 22 24 25   GLUCOSE 96 98 101*  BUN 67* 63* 60*  CREATININE 6.84* 6.34* 5.70*  CALCIUM 7.7* 7.6* 7.6*  MG -- 1.9 --  PHOS -- 6.8* --  Liver Function Tests:  Lab 12/26/12 0450 12/25/12 0223  AST 1979* 6820*  ALT 2486* 3918*  ALKPHOS 68 55  BILITOT 0.6 0.8  PROT 6.1 7.2  ALBUMIN 2.7* 3.1*    Lab 12/25/12 0620  LIPASE 91*  AMYLASE --   No results found for this basename: AMMONIA:3 in the last 168 hours  CBC:  Lab 12/26/12 0450 12/25/12 0126 12/24/12 0024 12/23/12 2054  WBC 8.1 10.3 -- 9.8  NEUTROABS -- 7.9* -- --  HGB 10.8* 12.7 14.3 --  HCT 32.9* 39.4 42.0 --  MCV 88.4 88.7 -- 91.7  PLT 119* 152 -- --    Cardiac Enzymes:  Lab 12/26/12 1100 12/26/12 0450 12/25/12 2300 12/25/12 1700 12/25/12 0901  CKTOTAL 1256* 1480* 1683* 1956* 2232*  CKMB -- -- -- -- --  CKMBINDEX -- -- -- -- --  TROPONINI -- -- -- -- --    BNP: No components found with this basename: POCBNP:3  CBG:  Lab 12/26/12 1214 12/26/12 0743 12/26/12 0442 12/25/12 2337 12/25/12 2042  GLUCAP 93 93 97 86 93    Microbiology: Results for orders placed during the hospital encounter of 12/25/12  CULTURE, BLOOD (ROUTINE X 2)     Status: Normal (Preliminary result)   Collection Time   12/25/12  1:10 AM      Component Value Range Status Comment   Specimen Description BLOOD RIGHT ARM   Final    Special Requests BOTTLES DRAWN AEROBIC AND ANAEROBIC 10CC EA   Final    Culture  Setup Time 12/25/2012 04:54   Final    Culture     Final    Value:        BLOOD CULTURE RECEIVED NO GROWTH TO DATE CULTURE WILL BE HELD FOR 5 DAYS BEFORE ISSUING A FINAL NEGATIVE REPORT   Report Status PENDING   Incomplete   CULTURE, BLOOD (ROUTINE X 2)     Status: Normal (Preliminary result)   Collection Time   12/25/12  1:25 AM      Component Value Range Status Comment   Specimen Description BLOOD RIGHT HAND   Final    Special Requests  BOTTLES DRAWN AEROBIC ONLY 10CC   Final    Culture  Setup Time 12/25/2012 04:54   Final    Culture     Final    Value:        BLOOD CULTURE RECEIVED NO GROWTH TO DATE CULTURE WILL BE HELD FOR 5 DAYS BEFORE ISSUING A FINAL NEGATIVE REPORT   Report Status PENDING   Incomplete   MRSA PCR SCREENING     Status: Normal   Collection Time   12/25/12  5:48 AM      Component Value Range Status Comment   MRSA by PCR NEGATIVE  NEGATIVE Final   URINE CULTURE     Status: Normal   Collection Time   12/25/12  6:16 AM      Component Value Range Status Comment   Specimen Description URINE, CATHETERIZED   Final    Special Requests NONE   Final    Culture  Setup Time 12/25/2012 06:52   Final    Colony Count NO GROWTH   Final    Culture NO GROWTH   Final    Report Status 12/26/2012 FINAL   Final     Coagulation Studies:  Basename 12/25/12 0620  LABPROT 19.4*  INR 1.70*    Urinalysis:  Basename 12/25/12 0616  COLORURINE AMBER*  LABSPEC 1.025  PHURINE 5.0  GLUCOSEU  NEGATIVE  HGBUR SMALL*  BILIRUBINUR SMALL*  KETONESUR 15*  PROTEINUR 100*  UROBILINOGEN 0.2  NITRITE NEGATIVE  LEUKOCYTESUR NEGATIVE      Imaging: Ct Femur Left Wo Contrast  12/25/2012  *RADIOLOGY REPORT*  Clinical Data: Pain and swelling.  CT OF THE LEFT FEMUR WITHOUT CONTRAST,CT TIBIA FIBULA LEFT WITHOUT CONTRAST  Comparison: None.  Findings: Mild diffuse cellulitis involving the left lower extremity most notably in the mid distal tibia / fibula region.  No discrete soft tissue abscess or findings for myofasciitis.  The bony structures are intact.  No findings to suggest septic arthritis or osteomyelitis.  Slightly enlarged / inflamed left inguinal lymph nodes are noted.  IMPRESSION: Cellulitis without findings for focal soft tissue abscess, myofasciitis, septic arthritis or osteomyelitis.   Original Report Authenticated By: Rudie Meyer, M.D.    Ct Tibia Fibula Left Wo Contrast  12/25/2012  *RADIOLOGY REPORT*  Clinical Data:  Pain and swelling.  CT OF THE LEFT FEMUR WITHOUT CONTRAST,CT TIBIA FIBULA LEFT WITHOUT CONTRAST  Comparison: None.  Findings: Mild diffuse cellulitis involving the left lower extremity most notably in the mid distal tibia / fibula region.  No discrete soft tissue abscess or findings for myofasciitis.  The bony structures are intact.  No findings to suggest septic arthritis or osteomyelitis.  Slightly enlarged / inflamed left inguinal lymph nodes are noted.  IMPRESSION: Cellulitis without findings for focal soft tissue abscess, myofasciitis, septic arthritis or osteomyelitis.   Original Report Authenticated By: Rudie Meyer, M.D.    US Renal Port  12/25/2012  *RADIOLOGY REPORT*  Clinical Data: Worsening renal function.  RENAL/URINARY TRACT ULTRASOUND COMPLETE  Comparison:  MRI 12/28/2005  Findings:  Right Kidney:  12.0 cm. Normal size and echotexture.  No focal abnormality.  No hydronephrosis.  Left Kidney:  12.8 cm. Normal size and echotexture.  No focal abnormality.  No hydronephrosis.  Bladder:  Not visualized.  IMPRESSION: No acute renal abnormality.  No hydronephrosis.  Nonvisualization of the bladder.   Original Report Authenticated By: Charlett Nose, M.D.    Dg Chest Port 1 View  12/26/2012  *RADIOLOGY REPORT*  Clinical Data: Shortness of breath.  Respiratory failure.  PORTABLE CHEST - 1 VIEW  Comparison: 12/25/2012  Findings: Central line tip is in the superior vena cava in good position.  There is new slight pulmonary vascular congestion.  No consolidative infiltrates or effusions.  The heart size is normal.  IMPRESSION: New pulmonary vascular congestion.   Original Report Authenticated By: Francene Boyers, M.D.    Dg Chest Port 1 View  12/25/2012  *RADIOLOGY REPORT*  Clinical Data: Central line placement  PORTABLE CHEST - 1 VIEW  Comparison: Prior chest x-ray performed earlier today, 12/25/2012 at 01:23 a.m.  Findings: Interval placement of a right IJ central venous catheter. The tip projects over the  superior cavoatrial junction.  No evidence of complicating pneumothorax.  Inspiratory volumes are low and there is increased bibasilar atelectasis.  Stable cardiomegaly. No acute osseous abnormality.  Mild pulmonary vascular congestion.  IMPRESSION:  1.  Interval placement of a right IJ central venous catheter without evidence of complicating pneumothorax.  The tip of the catheter projects over the superior cavoatrial junction. 2.  Lower inspiratory volumes with increased bibasilar atelectasis 3.  Mild pulmonary vascular congestion   Original Report Authenticated By: Malachy Moan, M.D.    Dg Chest Port 1 View  12/25/2012  *RADIOLOGY REPORT*  Clinical Data: Hypoxia, shortness of breath.  PORTABLE CHEST - 1 VIEW  Comparison: 09/16/2011  Findings:  Hypoaeration.  Heart size upper normal to mildly enlarged.  Mild bibasilar opacities.  No definite pleural effusion or pneumothorax.  No acute osseous finding.  IMPRESSION: Hypoaeration with mild bibasilar opacities; atelectasis versus early infiltrate.   Original Report Authenticated By: Jearld Lesch, M.D.       Assessment & Plan: Pt is a 32 y.o. yo female with a PMHx of morbid obesity, OSA/OHS (not on CPAP at home) who was admitted to [facility] on 12/25/2012 for evaluation of altered mental status, hypercarbia requiring BiPap and Acute renal failure.   # Acute Renal Failure:  Creatinine from 08/2011 of 1.00. This is likely ATN from combination of hypoperfusion to the kidneys from poor oral intake, hypotension, ARB/HCTZ in addition to rhabdomyolysis. No evidence of hydronephrosis on [place]. Received 4L NS and NS at 125cc/hr and 175cc/hr since admission with poor urine output: 490 cc/24hr.  - received one dose of lasix 80IV today. Consider lasix IV with IV fluids, with close monitoring of respiratory status, since CXR did show new vascular congestion this AM and CVP elevated at 20.  It seems that tank is full, she did seem to respond to lasix, will continue ATC and  monitor UOP.  No acute indications for HD right now, but I cannot guarantee that she wont need at some point.  However, I am optimistic for recovery given young age and seemingly normal renal function at baseline.  She seems to understand completely what I am saying - FeNa: 0.20% indicating pre-renal etiology, but can also be seen in rhabdo.  - no hyperkalemia, no acidosis, no uremic symptoms justifying dialysis.   # Rhabdomyolisis: likely from being down for more than 12 hours prior to admission - lasix and fluids with close monitoring of fluid status - monitor CK  - monitor urine output  # Hyponatremia: initially 130 on admit. Resolved.   # Respiratory Failure: on intermittent BiPap. New venous congestion on CXR. Per primary team.   # Elevated LFT's: likely from shock liver. Trending down. Hepatitis panel pending. Per primary team  # Cellulitis: on vanc and zosyn, improving. Blood cultures pending. Per primary team # Acute encephalopathy: resolved.   Marena Chancy, PGY-2 Family Medicine Resident  Patient seen and examined, agree with above note with above modifications. Pleasant 32 year old WF with HTN and borderline DM on metformin who now has cellulitis and hypotension complicating AKI.  She was adequately volume resuscitated but UOP has not been great so now showing signs of overload.  I gave her a dose of lasix 80 mg and she seemed to have some response so will continue q 6 hours until tomorrow.  No acute indication for HD at present but certainly could cross that threshold in the next 24-48 hours if doesn't start to turn around.  Patient is aware and seems to understand.  Asking good questions.   Annie Sable, MD 12/26/2012

## 2012-12-26 NOTE — Progress Notes (Signed)
CRITICAL CARE RESIDENT NOTE Interim Progress Note    RESULT INTERVENTION TAKEN  1. Mag 1.9 MAg 2 g  2. Cr 6.34  Consider renal consult  3.      Signed: Annett Gula, MD  PGY-1, Internal Medicine Resident Pager: [phone] (7PM-7AM) 12/26/2012, 6:02 AM

## 2012-12-26 NOTE — Progress Notes (Signed)
Patient feeling better.  Leg cellulitis improved, still red, but less intense and less painful to the touch.  Compartments soft, EHL, FHL firing, sensation intact, no pain with AROM or PROM of foot.  CT with no gas or abscess.  A/P: L leg cellulitis, no compartment syndrome or abscess  C/w medical management  Ok to f/u with me as outpatient if desired.  [phone].  Will sign off.  Eulas Post, MD 8:15 AM

## 2012-12-26 NOTE — Progress Notes (Addendum)
ANTIBIOTIC CONSULT NOTE Pharmacy Consult for Vancomycin, Zosyn Indication: Cellulitis  Allergies  Allergen Reactions  . Aspirin     REACTION: throat swelling, hives  . Doxycycline Other (See Comments)    Abdominal pain  . Niaspan (Niacin Er)     Caused flushing    Patient Measurements: Height: 5' 1.81" (157 cm) Weight: 402 lb 9 oz (182.6 kg) IBW/kg (Calculated) : 49.67  Adjusted Body Weight: 100 kg  Vital Signs: Temp: 98.3 F (36.8 C) (01/29 1255) Temp src: Oral (01/29 1255) BP: 104/61 mmHg (01/29 1249) Pulse Rate: 64  (01/29 1249)  Labs:  Basename 12/26/12 1100 12/26/12 0450 12/25/12 2300 12/25/12 0230 12/25/12 0126 12/24/12 0024  WBC -- 8.1 -- -- 10.3 --  HGB -- 10.8* -- -- 12.7 14.3  PLT -- 119* -- -- 152 --  LABCREA -- -- -- 282.12 -- --  CREATININE 6.84* 6.34* 5.70* -- -- --   Estimated Creatinine Clearance: 19.2 ml/min (by C-G formula based on Cr of 6.84).  Basename 12/26/12 1100  VANCOTROUGH --  VANCOPEAK --  VANCORANDOM 26.4  GENTTROUGH --  GENTPEAK --  GENTRANDOM --  TOBRATROUGH --  TOBRAPEAK --  TOBRARND --  AMIKACINPEAK --  AMIKACINTROU --  AMIKACIN --     Microbiology: Recent Results (from the past 720 hour(s))  CULTURE, BLOOD (ROUTINE X 2)     Status: Normal (Preliminary result)   Collection Time   12/25/12  1:10 AM      Component Value Range Status Comment   Specimen Description BLOOD RIGHT ARM   Final    Special Requests BOTTLES DRAWN AEROBIC AND ANAEROBIC 10CC EA   Final    Culture  Setup Time 12/25/2012 04:54   Final    Culture     Final    Value:        BLOOD CULTURE RECEIVED NO GROWTH TO DATE CULTURE WILL BE HELD FOR 5 DAYS BEFORE ISSUING A FINAL NEGATIVE REPORT   Report Status PENDING   Incomplete   CULTURE, BLOOD (ROUTINE X 2)     Status: Normal (Preliminary result)   Collection Time   12/25/12  1:25 AM      Component Value Range Status Comment   Specimen Description BLOOD RIGHT HAND   Final    Special Requests BOTTLES DRAWN  AEROBIC ONLY 10CC   Final    Culture  Setup Time 12/25/2012 04:54   Final    Culture     Final    Value:        BLOOD CULTURE RECEIVED NO GROWTH TO DATE CULTURE WILL BE HELD FOR 5 DAYS BEFORE ISSUING A FINAL NEGATIVE REPORT   Report Status PENDING   Incomplete   MRSA PCR SCREENING     Status: Normal   Collection Time   12/25/12  5:48 AM      Component Value Range Status Comment   MRSA by PCR NEGATIVE  NEGATIVE Final   URINE CULTURE     Status: Normal   Collection Time   12/25/12  6:16 AM      Component Value Range Status Comment   Specimen Description URINE, CATHETERIZED   Final    Special Requests NONE   Final    Culture  Setup Time 12/25/2012 06:52   Final    Colony Count NO GROWTH   Final    Culture NO GROWTH   Final    Report Status 12/26/2012 FINAL   Final     Medical History: Past Medical History  Diagnosis Date  . Hypertension   . Apnea   . CHF (congestive heart failure)   . Morbid obesity   . Hyperlipidemia     Per patient  . Prediabetes     Per patient    Assessment: 32 yo morbidly obese female with LLE cellulitis and ARF for empiric antibiotics. Vancomycin 1 g IV given in ED at 0300, and 1500 mg IV given at 1117 on 1/28 for a total loading dose of 2500 mg. Patient's random Vanc level today was 26.4 (obtained 24 hours after LD), which is a little higher than expected. Patient's renal function continues to decline. I will let the level fall a little more and re-start Vancomycin tonight on a 48 hour interval.   Goal of Therapy:  Vancomycin trough level 10-15 mcg/ml  Plan:  Vancomycin 2g IV q48h starting at 2200 Decrease Zosyn to 2.25g IV q8h Will f/u true vancomycin trough at steady state   Thank you, [employer], Pharm.D. Clinical Pharmacist   Pager: [phone] 12/26/2012 1:20 PM

## 2012-12-27 ENCOUNTER — Inpatient Hospital Stay (HOSPITAL_COMMUNITY): Payer: BC Managed Care – PPO

## 2012-12-27 ENCOUNTER — Encounter (HOSPITAL_COMMUNITY): Payer: Self-pay | Admitting: *Deleted

## 2012-12-27 LAB — COMPREHENSIVE METABOLIC PANEL
ALT: 1875 U/L — ABNORMAL HIGH (ref 0–35)
AST: 776 U/L — ABNORMAL HIGH (ref 0–37)
Alkaline Phosphatase: 65 U/L (ref 39–117)
CO2: 21 mEq/L (ref 19–32)
Calcium: 8 mg/dL — ABNORMAL LOW (ref 8.4–10.5)
Chloride: 98 mEq/L (ref 96–112)
GFR calc Af Amer: 7 mL/min — ABNORMAL LOW (ref >90)
GFR calc non Af Amer: 6 mL/min — ABNORMAL LOW (ref >90)
Glucose, Bld: 82 mg/dL (ref 70–99)
Potassium: 5 mEq/L (ref 3.5–5.1)
Sodium: 139 mEq/L (ref 135–145)
Total Bilirubin: 0.4 mg/dL (ref 0.3–1.2)

## 2012-12-27 LAB — GLUCOSE, CAPILLARY
Glucose-Capillary: 101 mg/dL — ABNORMAL HIGH (ref 70–99)
Glucose-Capillary: 79 mg/dL (ref 70–99)

## 2012-12-27 LAB — BASIC METABOLIC PANEL
BUN: 79 mg/dL — ABNORMAL HIGH (ref 6–23)
CO2: 21 mEq/L (ref 19–32)
Calcium: 8 mg/dL — ABNORMAL LOW (ref 8.4–10.5)
Chloride: 101 mEq/L (ref 96–112)
Creatinine, Ser: 8.33 mg/dL — ABNORMAL HIGH (ref 0.50–1.10)
Glucose, Bld: 82 mg/dL (ref 70–99)

## 2012-12-27 LAB — PROCALCITONIN: Procalcitonin: 11.1 ng/mL

## 2012-12-27 LAB — CBC
Hemoglobin: 10.8 g/dL — ABNORMAL LOW (ref 12.0–15.0)
MCH: 28.8 pg (ref 26.0–34.0)
Platelets: 125 10*3/uL — ABNORMAL LOW (ref 150–400)
RBC: 3.75 MIL/uL — ABNORMAL LOW (ref 3.87–5.11)
WBC: 5.5 10*3/uL (ref 4.0–10.5)

## 2012-12-27 LAB — CK: Total CK: 504 U/L — ABNORMAL HIGH (ref 7–177)

## 2012-12-27 MED ORDER — INSULIN ASPART 100 UNIT/ML ~~LOC~~ SOLN: 0.0000 [IU] | Freq: Every day | SUBCUTANEOUS | Status: DC

## 2012-12-27 MED ORDER — SODIUM CHLORIDE 0.9 % IV SOLN
INTRAVENOUS | Status: DC
  Administered 2012-12-27: 21:00:00 via INTRAVENOUS
  Administered 2012-12-27: 20 mL/h via INTRAVENOUS
  Administered 2012-12-28: 22:00:00 via INTRAVENOUS

## 2012-12-27 MED ORDER — PNEUMOCOCCAL VAC POLYVALENT 25 MCG/0.5ML IJ INJ
0.5000 mL | INJECTION | INTRAMUSCULAR | Status: AC
  Administered 2012-12-28: 0.5 mL via INTRAMUSCULAR
  Filled 2012-12-27: qty 0.5

## 2012-12-27 MED ORDER — INSULIN ASPART 100 UNIT/ML ~~LOC~~ SOLN
0.0000 [IU] | Freq: Three times a day (TID) | SUBCUTANEOUS | Status: DC
  Administered 2012-12-28 – 2013-01-01 (×4): 2 [IU] via SUBCUTANEOUS
  Administered 2013-01-02: 3 [IU] via SUBCUTANEOUS
  Administered 2013-01-02: 2 [IU] via SUBCUTANEOUS

## 2012-12-27 NOTE — Progress Notes (Signed)
Patient transferred from 2100. Skin intact with LLE cellulitis. Patient oriented to room.

## 2012-12-27 NOTE — Care Management Note (Signed)
Page 1 of 2   01/07/2013     11:23:20 AM   CARE MANAGEMENT NOTE 01/07/2013  Patient:  Alexandra Beck, Alexandra Beck   Account Number:  [id number]  Date Initiated:  12/26/2012  Documentation initiated by:  [facility]  Subjective/Objective Assessment:   Acute resp failure requiring bipap and renal failure.     Action/Plan:   pt/ot eval-rec hhpt   Anticipated DC Date:  01/06/2013   Anticipated DC Plan:  HOME W HOME HEALTH SERVICES      DC Planning Services  CM consult      [facility] Choice  HOME HEALTH   Choice offered to / List presented to:  C-1 Patient        HH arranged  HH-1 RN      [facility] agency  [facility]   Status of service:  Completed, signed off Medicare Important Message given?   (If response is "NO", the following Medicare IM given date fields will be blank) Date Medicare IM given:   Date Additional Medicare IM given:    Discharge Disposition:  HOME W HOME HEALTH SERVICES  Per UR Regulation:  Reviewed for med. necessity/level of care/duration of stay  If discussed at Long Length of Stay Meetings, dates discussed:   01/01/2013  01/03/2013    Comments:  Elliot Cousin, RN Case Manager Signed CASE MANAGEMENT Progress Notes Service date: 01/06/2013 11:54 AM NCM spoke to pt and states she does not want HH PT/OT for home. HH RN orders faxed to [facility] for scheduled d/c home today. Will lab draw and dressing changes needed. Faxed notification to [facility] of pt dc home today. [facility] contact info added to dc instructions. Alexandra Donning RN CCM Case Mgmt phone [phone]      Contact:  Alexandra,Beck Significant other [phone]                 Melenda, [place] Mother [phone]  01/03/13 10:23 Alexandra Cape RN, BSN [phone] cr slowly reaching plateau and starting to decline.  No dialysis indications, autodiuresing. Conts on intermittent bipap. Per Renal would like to see cr at 5 before dc home, patient will need to drink a lot to get cr down.  01/02/13 11:51 Alexandra Cape RN, BSN  [phone] patient  worked with physical therapy today, they stated all she needs is HHPT, and a toilet aide which they have in the gift shop down here at hospital.  She will also need a HHRN.  Patient chose [facility] from agency list, referral made to [facility] , Lupita Leash notiifed, soc will begin 24-48 hrs post discharge.  Per MD patient will also need a bipap at dc , I spoke with Greggory Stallion the Production designer, theatre/television/film of [employer] in [place] , he states he will ast patient with the outstanding balance she has so I will send information to [facility] when receive the order.  12/28/12 16:51 Alexandra Cape RN, BSN [phone] patient does not need a new cpap machine she just needs a mask to go with the cpap at home, we can get her a mask here for her to take home.   12-27-12 10:35am Alexandra Beck, RNBSN [phone] Talked with [facility] [phone]  Verified patient has a CPap and she now owns it.  States will need copy of sleep study/titration done in 2008 - is from [facility] Sleep study per patient- and per [facility] if we get order for equip for cpap they can get study and contact patient about what equipment she needs.  fax  at  [facility] @ [phone]. Patient verifies she does have a machine and it does work.  12-26-12 4:30pm Alexandra Beck, RNBSN [phone] Patient has had CPAP at home with [facility] but machine is broken.  Has a large bill when she was self pay with [employer] that she cannot pay. Now has insurance with new work - will call [employer].

## 2012-12-27 NOTE — Evaluation (Addendum)
Physical Therapy Evaluation Patient Details Name: Alexandra Beck MRN: [id number] DOB: [birth year] Today's Date: 12/27/2012 Time: 0905-0930 PT Time Calculation (min): 25 min  PT Assessment / Plan / Recommendation Clinical Impression  Pt s/p LLE cellulitis and hypoxia with decr mobility secondary to decr endurance and decr balance.  Will benefit from PT to address mobility issues and assist pt in reaching Modif I level prior to d/c home.     PT Assessment  Patient needs continued PT services    Follow Up Recommendations  Home health PT;Supervision - Intermittent                Equipment Recommendations   (TBA - may need a wide RW)         Frequency Min 3X/week    Precautions / Restrictions Precautions Precautions: Fall Restrictions Weight Bearing Restrictions: No   Pertinent Vitals/Pain See doc flowsheet desat to 88% on RA with transfer, replaced O2 at 4 L with O2 98%; Some pain      Mobility  Bed Mobility Bed Mobility: Rolling Right;Right Sidelying to Sit Rolling Right: 3: Mod assist Right Sidelying to Sit: 3: Mod assist Details for Bed Mobility Assistance: cues for technique and sequencing Transfers Transfers: Sit to Stand;Stand to Sit Sit to Stand: 1: +2 Total assist;With upper extremity assist;From bed Sit to Stand: Patient Percentage: 80% Stand to Sit: 1: +2 Total assist;With upper extremity assist;With armrests;To chair/3-in-1 Stand to Sit: Patient Percentage: 80% Details for Transfer Assistance: Pt needed cues for hand placement and steadying assist initially upon standing.  Ambulation/Gait Ambulation/Gait Assistance: 1: +2 Total assist Ambulation/Gait: Patient Percentage: 80% Ambulation Distance (Feet): 3 Feet Assistive device: 2 person hand held assist Ambulation/Gait Assistance Details: Pt with wide BOS.  Able to weight shift with bil UE support pushing greater on left hand.   Gait Pattern: Wide base of support Gait velocity: decreased Wheelchair  Mobility Wheelchair Mobility: No              PT Diagnosis: Generalized weakness  PT Problem List: Decreased activity tolerance;Decreased mobility;Decreased safety awareness;Decreased knowledge of use of DME;Decreased knowledge of precautions;Pain;Decreased strength PT Treatment Interventions: DME instruction;Gait training;Stair training;Functional mobility training;Therapeutic activities;Therapeutic exercise;Balance training;Patient/family education   PT Goals Acute Rehab PT Goals PT Goal Formulation: With patient Time For Goal Achievement: 01/10/13 Potential to Achieve Goals: Good Pt will go Supine/Side to Sit: Independently PT Goal: Supine/Side to Sit - Progress: Goal set today Pt will go Sit to Stand: Independently PT Goal: Sit to Stand - Progress: Goal set today Pt will Transfer Bed to Chair/Chair to Bed: Independently PT Transfer Goal: Bed to Chair/Chair to Bed - Progress: Goal set today Pt will Ambulate: 51 - 150 feet;with modified independence;with least restrictive assistive device PT Goal: Ambulate - Progress: Goal set today Pt will Go Up / Down Stairs: 3-5 stairs;with supervision;with least restrictive assistive device PT Goal: Up/Down Stairs - Progress: Goal set today  Visit Information  Last PT Received On: 12/27/12 Assistance Needed: +2 (for safety due to girth) PT/OT Co-Evaluation/Treatment: Yes    Subjective Data  Subjective: "I want to get up." Patient Stated Goal: To go home   Prior Functioning  Home Living Lives With: Significant other (partner) Available Help at Discharge: Available PRN/intermittently (has some family that may help; partner works) Type of Home: House Home Access: Stairs to enter Secretary/administrator of Steps: 3 Entrance Stairs-Rails: Can reach both Home Layout: One level Bathroom Shower/Tub: Engineer, manufacturing systems: Standard Home Adaptive Equipment: None Prior Function  Level of Independence: Independent Driving: Yes  (scooter) Vocation: Full time employment Comments: works nights Communication Communication: No difficulties Dominant Hand: Right    Cognition  Overall Cognitive Status: Appears within functional limits for tasks assessed/performed Arousal/Alertness: Awake/alert Orientation Level: Appears intact for tasks assessed Behavior During Session: [facility] for tasks performed    Extremity/Trunk Assessment Right Upper Extremity Assessment RUE ROM/Strength/Tone: [facility] for tasks assessed Left Upper Extremity Assessment LUE ROM/Strength/Tone: [facility] for tasks assessed Right Lower Extremity Assessment RLE ROM/Strength/Tone: [facility] for tasks assessed;Deficits RLE ROM/Strength/Tone Deficits: hip flexion limited due to girth Left Lower Extremity Assessment LLE ROM/Strength/Tone: [facility] for tasks assessed;Deficits LLE ROM/Strength/Tone Deficits: hip flexion limited due to girth   Balance Static Standing Balance Static Standing - Balance Support: Bilateral upper extremity supported;During functional activity Static Standing - Level of Assistance: 4: Min assist Static Standing - Comment/# of Minutes: needed steadying assist with bil UE support  End of Session PT - End of Session Equipment Utilized During Treatment: Gait belt;Oxygen Activity Tolerance: Patient tolerated treatment well Patient left: in chair;with call bell/phone within reach;with nursing in room Nurse Communication: Mobility status       INGOLD,Kalab Camps 12/27/2012, 9:51 AM  [facility] [phone] (pager)

## 2012-12-27 NOTE — Progress Notes (Signed)
PULMONARY  / CRITICAL CARE MEDICINE  Name: Alexandra Beck MRN: [id number] DOB: August 16, 1981    ADMISSION DATE:  12/25/2012 CONSULTATION DATE:  12/25/2012  REFERRING MD :  EDP  CHIEF COMPLAINT:  Acute respiratory failure  BRIEF PATIENT DESCRIPTION: 32yo F with morbid obesity, OSA/OHS (not on positive pressure therapy for past month due to broken machine), opioids and chronic respiratory failure, and h/o cellulitis requiring IV abx was brought to [facility] ED with AKI, altered mental status, and hypercarbia requiring BiPAP.   Of note she was on Losartan / HCTZ prior to admission and had decreased fluid intake for 24-48 h prior to coming to ED.  SIGNIFICANT EVENTS / STUDIES:  1/28  Admitted with acute on chronic respiratory failure requiring BiPAP and acute renal failure; placed on bipap; no IV access, RIJ placed; worsening LLE cellulitis. 1/28 Elevated AST/ALT, ARF, per Ortho no compartment syndrome, CT shows cellulitis only, BLE dopplers neg. 1/28-1/29 Low uop overnight. Worsening Cr. 1/29 Renal consulted. Given Lasix , improved uop. 1/30 Worsening Cr  LINES / TUBES: 1/28 RIJ >> 1/28 bipap, intermittent  1/29- off bipap  CULTURES: 1/28  Blood Cx x2 >>> 1/28 Urine Cx >> Negative 1/28 Hepatitis panel >>  ANTIBIOTICS: Rocephin 1/27 x 1 Clindamycin 1/28 >> Vancomycin 1/28 >>> Zosyn 1/28 >>>  INTERVAL HISTORY: 1/28  Admitted with acute on chronic respiratory failure requiring BiPAP and acute renal failure; placed on bipap; no IV access, RIJ placed; worsening LLE cellulitis, rhabdo - CK >2000. 1/29  Improved CK and LFTs, worsening renal fx, Renal consulted & pt started on Lasix 80 q6 1/30 CXR pulmonary edema improved  VITAL SIGNS: Temp:  [97.3 F (36.3 C)-98.5 F (36.9 C)] 97.3 F (36.3 C) (01/30 0409) Pulse Rate:  [59-69] 62  (01/30 0600) Resp:  [7-19] 12  (01/30 0600) BP: (104-128)/(46-76) 114/62 mmHg (01/30 0600) SpO2:  [95 %-100 %] 96 % (01/30 0600) FiO2 (%):  [30 %] 30 % (01/30  0600) Weight:  [416 lb 10.7 oz (189 kg)] 416 lb 10.7 oz (189 kg) (01/30 0500)  HEMODYNAMICS: CVP:  [17 mmHg-24 mmHg] 24 mmHg VENTILATOR SETTINGS: Vent Mode:  [-]  FiO2 (%):  [30 %] 30 %  INTAKE / OUTPUT: Intake/Output      01/29 0701 - 01/30 0700 01/30 0701 - 01/31 0700   I.V. (mL/kg) 175 (0.9)    IV Piggyback 886.5    Total Intake(mL/kg) 1061.5 (5.6)    Urine (mL/kg/hr) 1035 (0.2)    Total Output 1035    Net +26.5          PHYSICAL EXAMINATION: General:  Morbidly obese, mechanically ventilated with BiAPP Neuro:  Alert and oriented HEENT:  Bipap mask in place Cardiovascular:  RRR, no m/r/g Lungs:  CTAB Abdomen:  Obese, soft, NT Musculoskeletal:  Moves all extremities, LLE moderate TTP, continued improvement of erythema in LLE Skin:  Intact with LLE cellulitis  LABS:  Lab 12/27/12 0505 12/26/12 2240 12/26/12 1653 12/26/12 0452 12/26/12 0450 12/25/12 2300 12/25/12 1830 12/25/12 1644 12/25/12 0633 12/25/12 0620 12/25/12 0223 12/25/12 0126  HGB 10.8* -- -- -- 10.8* -- -- -- -- -- -- 12.7  WBC 5.5 -- -- -- 8.1 -- -- -- -- -- -- 10.3  PLT 125* -- -- -- 119* -- -- -- -- -- -- 152  NA 139 140 140 -- -- -- -- -- -- -- -- --  K 5.0 5.2* -- -- -- -- -- -- -- -- -- --  CL 98 102 102 -- -- -- -- -- -- -- -- --  CO2 21 23 23  -- -- -- -- -- -- -- -- --  GLUCOSE 82 87 88 -- -- -- -- -- -- -- -- --  BUN 75* 72* 70* -- -- -- -- -- -- -- -- --  CREATININE 8.17* 7.62* 7.09* -- -- -- -- -- -- -- -- --  CALCIUM 8.0* 7.8* 7.6* -- -- -- -- -- -- -- -- --  MG 2.5 -- -- -- 1.9 -- -- -- -- -- -- --  PHOS 9.0* -- -- -- 6.8* -- -- -- -- -- -- --  AST 776* -- -- -- 1979* -- -- -- -- -- 6820* --  ALT 1875* -- -- -- 2486* -- -- -- -- -- 3918* --  ALKPHOS 65 -- -- -- 68 -- -- -- -- -- 55 --  BILITOT 0.4 -- -- -- 0.6 -- -- -- -- -- 0.8 --  PROT 6.4 -- -- -- 6.1 -- -- -- -- -- 7.2 --  ALBUMIN 2.6* -- -- -- 2.7* -- -- -- -- -- 3.1* --  APTT -- -- -- -- -- -- -- -- -- 32 -- --  INR -- -- -- -- -- --  -- -- -- 1.70* -- --  LATICACIDVEN -- -- -- -- -- 0.7 0.8 -- -- 1.7 -- --  TROPONINI -- -- -- -- -- -- -- -- -- -- -- --  PROCALCITON 11.10 -- -- -- -- -- -- -- -- -- 27.01 --  PROBNP -- -- -- -- -- -- -- -- -- -- -- --  O2SATVEN -- -- -- -- -- -- -- -- -- -- -- --  PHART -- -- -- 7.252* -- -- -- 7.279* 7.303* -- -- --  PCO2ART -- -- -- 50.4* -- -- -- 55.2* 52.6* -- -- --  PO2ART -- -- -- 117.0* -- -- -- 66.0* 109.0* -- -- --    Lab 12/27/12 0407 12/26/12 2353 12/26/12 1950 12/26/12 1554 12/26/12 1214  GLUCAP 84 79 74 79 93    IMAGING: Ct Femur Left Wo Contrast  12/25/2012  *RADIOLOGY REPORT*  Clinical Data: Pain and swelling.  CT OF THE LEFT FEMUR WITHOUT CONTRAST,CT TIBIA FIBULA LEFT WITHOUT CONTRAST  Comparison: None.  Findings: Mild diffuse cellulitis involving the left lower extremity most notably in the mid distal tibia / fibula region.  No discrete soft tissue abscess or findings for myofasciitis.  The bony structures are intact.  No findings to suggest septic arthritis or osteomyelitis.  Slightly enlarged / inflamed left inguinal lymph nodes are noted.  IMPRESSION: Cellulitis without findings for focal soft tissue abscess, myofasciitis, septic arthritis or osteomyelitis.   Original Report Authenticated By: Rudie Meyer, M.D.    Ct Tibia Fibula Left Wo Contrast  12/25/2012  *RADIOLOGY REPORT*  Clinical Data: Pain and swelling.  CT OF THE LEFT FEMUR WITHOUT CONTRAST,CT TIBIA FIBULA LEFT WITHOUT CONTRAST  Comparison: None.  Findings: Mild diffuse cellulitis involving the left lower extremity most notably in the mid distal tibia / fibula region.  No discrete soft tissue abscess or findings for myofasciitis.  The bony structures are intact.  No findings to suggest septic arthritis or osteomyelitis.  Slightly enlarged / inflamed left inguinal lymph nodes are noted.  IMPRESSION: Cellulitis without findings for focal soft tissue abscess, myofasciitis, septic arthritis or osteomyelitis.   Original  Report Authenticated By: Rudie Meyer, M.D.    US Renal Port  12/25/2012  *RADIOLOGY REPORT*  Clinical Data: Worsening renal function.  RENAL/URINARY TRACT ULTRASOUND COMPLETE  Comparison:  MRI 12/28/2005  Findings:  Right Kidney:  12.0 cm. Normal size and echotexture.  No focal abnormality.  No hydronephrosis.  Left Kidney:  12.8 cm. Normal size and echotexture.  No focal abnormality.  No hydronephrosis.  Bladder:  Not visualized.  IMPRESSION: No acute renal abnormality.  No hydronephrosis.  Nonvisualization of the bladder.   Original Report Authenticated By: Charlett Nose, M.D.    Dg Chest Port 1 View  12/26/2012  *RADIOLOGY REPORT*  Clinical Data: Shortness of breath.  Respiratory failure.  PORTABLE CHEST - 1 VIEW  Comparison: 12/25/2012  Findings: Central line tip is in the superior vena cava in good position.  There is new slight pulmonary vascular congestion.  No consolidative infiltrates or effusions.  The heart size is normal.  IMPRESSION: New pulmonary vascular congestion.   Original Report Authenticated By: Francene Boyers, M.D.    Dg Chest Port 1 View  12/25/2012  *RADIOLOGY REPORT*  Clinical Data: Central line placement  PORTABLE CHEST - 1 VIEW  Comparison: Prior chest x-ray performed earlier today, 12/25/2012 at 01:23 a.m.  Findings: Interval placement of a right IJ central venous catheter. The tip projects over the superior cavoatrial junction.  No evidence of complicating pneumothorax.  Inspiratory volumes are low and there is increased bibasilar atelectasis.  Stable cardiomegaly. No acute osseous abnormality.  Mild pulmonary vascular congestion.  IMPRESSION:  1.  Interval placement of a right IJ central venous catheter without evidence of complicating pneumothorax.  The tip of the catheter projects over the superior cavoatrial junction. 2.  Lower inspiratory volumes with increased bibasilar atelectasis 3.  Mild pulmonary vascular congestion   Original Report Authenticated By: Malachy Moan,  M.D.     ASSESSMENT / PLAN:  PULMONARY A:   Acute on chronic respiratory failure in setting of OSA/OHS, CPAP/BiPAP noncompliance, opioids. Tolerating intermittent bipap very well, improved resp status Improved pulmonary edema on CXR P:   Intermittent BiPAP to off AM CXR reviewed, improved IS Upright position Diuresing per Renal ensure nocturnal bipap only, osh  CARDIOVASCULAR A:  Hypotension, SIRS/Sepsis; not requiring pressors; resolved Hypovolemia, now with some overload, CVP elevated, improving with Lasix Marginally elevated lactate on admission, resolved H/o HTN  H/o systolic/diastolic CHF without evidence of exacerbation, last ECHO on record from 09/2009.  H/o dyslipidemia. R/o pulm htn P:  Goal MAP>60 KVO ECHO awaited Holding Lopressor, Losartan, HCTZ Crestor when able Checking CVPs-last 24, dc cvp, kvo, Lasix 80 q8h per Renal  RENAL A:  Acute on chronic renal failure, worsening Cr, improved uop with diuresis Hypovolemia, resolved.   Hyponatremia, resolved. Rhabdo, CK trending downward Renal u/s normal Pulm edema P:   Trend BMP q12h Trend CK qday kvo F/u with Renal Lasix per Renal  GASTROINTESTINAL A:   LFTs significantly elevated, shock liver unlikely as not on pressors, most worried for ischemic tissue; LFTs trending downward Salicylates and Acetaminophen levels normal Acute hep panel Negative P:   Advance diet today GI PPx (sepsis risk) Follow LFTs, cpk  HEMATOLOGIC A:  No leukocytosis, WBC trending downward, coagulapthy (liver, group a strep sepsis?), Elevated d-dimer and fibrinogen, DIC unlikely Factor 8 assay elevated, likely related to liver dz and NOT dic BLE doppler neg for DVT Drop in platelets, likely sepsis, improving P:  Trend CBC No role ffp at this stage No hep exposure pre ICU STAY  INFECTIOUS A:   Cellulitis LLE- h/o cellulitis req IV abx. No DVT per doppler, no compartment syndrome per Ortho, CT LLE with cellulitis  only, ?group a strep  as organisms Cellulitis improving ? If she has some degree ischemic tissue, muscle? R/o nec fasc P:   F/u cultures, neg thus far Remains off pressors, dc clinda Dc vanc Maintain zosyn, polymicrobial likely  ENDOCRINE  A:   Suspected DM, Hgb A1c 6.2.   Hyperglycemia.  Hypothyroidism, TSH wnl P:   ICU Glycemic Control Protocol, Phase 1 Holding Metformin, do not restart Continue home Synthroid dose  NEUROLOGIC A:   Acute encephalopathy (septic/methabolic) P:   RASS -1, goal 0 Holding opioids  CLINICAL SUMMARY: 32yo F with morbid obesity, OSA/OHS (not on positive pressure therapy for past month due to broken machine), opioids and chronic respiratory failure, and h/o cellulitis requiring IV abx was brought to [facility] ED with altered mental status and hypercarbia requiring BiPAP.  Labs reveled acute renal failure.  Of note she was on Losartan / HCTZ prior to admission and had decreased fluid intake for 24-48 h prior to coming to ED. She was placed on bipap; a RIJ was placed. LLE cellulitis improving, ? group a strep as organism. No DVT. Increasing edema with worsening renal fx, Renal consulted and Lasix started. Improved clinically, to sdu   Friend's contact # [phone].  Genelle Gather, MD Internal Medicine Resident, PGY I [facility] Internal Medicine Program Pager: [phone] 12/27/2012 7:34 AM   I have fully examined this patient and agree with above findings.     Mcarthur Rossetti. Tyson Alias, MD, FACP Pgr: [phone] [facility]

## 2012-12-27 NOTE — Evaluation (Signed)
Occupational Therapy Evaluation Patient Details Name: Alexandra Beck MRN: [id number] DOB: 02-May-1981 Today's Date: 12/27/2012 Time: 4098-1191 OT Time Calculation (min): 33 min  OT Assessment / Plan / Recommendation Clinical Impression  Pt admitted with altered mental status, hypercarbia requiring BiPap and Acute renal failure.  Pt will benefit from acute OT services to address below problem list in prep for return home.    OT Assessment  Patient needs continued OT Services    Follow Up Recommendations  Home health OT    Barriers to Discharge None Reports she can arrange PRN assist from mother.  Equipment Recommendations  Tub/shower bench;3 in 1 bedside comode (bariatric)    Recommendations for Other Services    Frequency  Min 2X/week    Precautions / Restrictions Precautions Precautions: Fall Restrictions Weight Bearing Restrictions: No   Pertinent Vitals/Pain Pt performed SPT transfer on RA and O2 sats reading 88%.  Applied 4L O2 and O2 increased to 98%.     ADL  Grooming: Performed;Wash/dry face;Set up Where Assessed - Grooming: Unsupported sitting Upper Body Bathing: Simulated;Set up Where Assessed - Upper Body Bathing: Unsupported sitting Lower Body Bathing: Simulated;Moderate assistance Where Assessed - Lower Body Bathing: Unsupported sitting Upper Body Dressing: Simulated;Set up Where Assessed - Upper Body Dressing: Unsupported sitting Lower Body Dressing: Performed;Maximal assistance Where Assessed - Lower Body Dressing: Unsupported sitting Toilet Transfer: Simulated;+2 Total assistance Toilet Transfer: Patient Percentage: 80% Toilet Transfer Method: Sit to stand;Stand pivot Toilet Transfer Equipment:  (bed to chair) Equipment Used: Wheelchair (pt transferred from bed to w/c) Transfers/Ambulation Related to ADLs: +2 assist for safety and steadying with bil HHA during SPT from bed to w/c. ADL Comments: Pt required max assist for donning bil socks while sitting EOB.   Pt reports she typically stands to don socks.  Pt transferred to w/c (bariatric recliner unavailable at this time). Plans to sit in w/c for 40 min until next round on Bi pap. RN aware and present during transfer.    OT Diagnosis: Generalized weakness;Acute pain  OT Problem List: Decreased strength;Decreased activity tolerance;Impaired balance (sitting and/or standing);Decreased knowledge of use of DME or AE;Pain;Obesity OT Treatment Interventions: Self-care/ADL training;DME and/or AE instruction;Therapeutic activities;Patient/family education;Balance training   OT Goals Acute Rehab OT Goals OT Goal Formulation: With patient Time For Goal Achievement: 01/10/13 Potential to Achieve Goals: Good ADL Goals Pt Will Perform Grooming: with modified independence;Standing at sink ADL Goal: Grooming - Progress: Goal set today Pt Will Perform Lower Body Bathing: with modified independence;Sit to stand from chair;Sit to stand from bed;with adaptive equipment ADL Goal: Lower Body Bathing - Progress: Goal set today Pt Will Perform Lower Body Dressing: with modified independence;Sit to stand from bed;Sit to stand from chair;with adaptive equipment ADL Goal: Lower Body Dressing - Progress: Goal set today Pt Will Transfer to Toilet: with modified independence;Ambulation;with DME;Extra wide 3-in-1;Comfort height toilet ADL Goal: Toilet Transfer - Progress: Goal set today Pt Will Perform Toileting - Clothing Manipulation: with modified independence;Standing ADL Goal: Toileting - Clothing Manipulation - Progress: Goal set today Pt Will Perform Toileting - Hygiene: with modified independence;Sit to stand from 3-in-1/toilet ADL Goal: Toileting - Hygiene - Progress: Goal set today Pt Will Perform Tub/Shower Transfer: Tub transfer;with modified independence;Ambulation;with DME;Transfer tub bench ADL Goal: Tub/Shower Transfer - Progress: Goal set today Miscellaneous OT Goals Miscellaneous OT Goal #1: Pt will  perform bed mobility with HOB flat at mod I level as precursor for EOB ADLs. OT Goal: Miscellaneous Goal #1 - Progress: Goal set today Miscellaneous OT  Goal #2: Pt will perform all functional mobility at mod I level. OT Goal: Miscellaneous Goal #2 - Progress: Goal set today  Visit Information  Last OT Received On: 12/27/12 Assistance Needed: +2 (for safety due to girth) PT/OT Co-Evaluation/Treatment: Yes    Subjective Data  Subjective: "I want to be able to walk normal again"   Prior Functioning     Home Living Lives With: Significant other (partner) Available Help at Discharge: Available PRN/intermittently (has some family that may help; partner works) Type of Home: House Home Access: Stairs to enter Secretary/administrator of Steps: 3 Entrance Stairs-Rails: Can reach both Home Layout: One level Bathroom Shower/Tub: Network engineer: None Prior Function Level of Independence: Independent Driving: Yes (scooter) Vocation: Full time employment Comments: works nights Communication Communication: No difficulties Dominant Hand: Right         Vision/Perception     Cognition  Overall Cognitive Status: Appears within functional limits for tasks assessed/performed Arousal/Alertness: Awake/alert Orientation Level: Appears intact for tasks assessed Behavior During Session: [facility] for tasks performed    Extremity/Trunk Assessment Right Upper Extremity Assessment RUE ROM/Strength/Tone: [facility] for tasks assessed Left Upper Extremity Assessment LUE ROM/Strength/Tone: [facility] for tasks assessed Right Lower Extremity Assessment RLE ROM/Strength/Tone: [facility] for tasks assessed;Deficits RLE ROM/Strength/Tone Deficits: hip flexion limited due to girth Left Lower Extremity Assessment LLE ROM/Strength/Tone: [facility] for tasks assessed;Deficits LLE ROM/Strength/Tone Deficits: hip flexion limited due to girth     Mobility Bed Mobility Bed Mobility:  Rolling Right;Right Sidelying to Sit Rolling Right: 3: Mod assist Right Sidelying to Sit: 3: Mod assist Details for Bed Mobility Assistance: cues for technique and sequencing Transfers Transfers: Sit to Stand;Stand to Sit Sit to Stand: 1: +2 Total assist;With upper extremity assist;From bed Sit to Stand: Patient Percentage: 80% Stand to Sit: 1: +2 Total assist;With upper extremity assist;With armrests;To chair/3-in-1 Stand to Sit: Patient Percentage: 80% Details for Transfer Assistance: Pt needed cues for hand placement and steadying assist initially upon standing.      Shoulder Instructions     Exercise     Balance Static Standing Balance Static Standing - Balance Support: Bilateral upper extremity supported;During functional activity Static Standing - Level of Assistance: 4: Min assist Static Standing - Comment/# of Minutes: needed steadying assist with bil UE support   End of Session OT - End of Session Activity Tolerance: Patient tolerated treatment well Patient left: with call bell/phone within reach;in chair;with nursing in room Nurse Communication: Mobility status  GO    12/27/2012 Cipriano Mile OTR/L Pager [phone] Office [phone]  Cipriano Mile 12/27/2012, 9:52 AM

## 2012-12-27 NOTE — Progress Notes (Signed)
[facility]  Subjective:  On BiPap overnight. Mother at bedside. No major events overnight. Thinks SOB is better, currently on [place], sat 100% on 4 liters.  Responded to lasix UOP: 1035cc/24hr, 900 cc in last 12 hours s/p 2 doses IV.    Objective: Vital signs in last 24 hours: Blood pressure 112/62, pulse 59, temperature 97.8 F (36.6 C), temperature source Oral, resp. rate 11, height 5' 1.81" (1.57 m), weight 416 lb 10.7 oz (189 kg), last menstrual period 12/03/2012, SpO2 97.00%.    PHYSICAL EXAM Gen: NAD, on BiPap  Chest: Clear breath sounds. No crackles anteriorly Cardio: regular rate and rhythm, S1, S2 normal, no murmur GI: soft, non-tender; bowel sounds normal Extremities: no pitting edema  Neurologic: No focal deficit, no asterixis Skin: erythema on left leg improving compared to yesterday  Lab Results:   Lab 12/27/12 0505 12/26/12 2240 12/26/12 1653 12/26/12 0450  NA 139 140 140 --  K 5.0 5.2* 4.9 --  CL 98 102 102 --  CO2 21 23 23  --  BUN 75* 72* 70* --  CREATININE 8.17* 7.62* 7.09* --  ALB -- -- -- --  GLUCOSE 82 -- -- --  CALCIUM 8.0* 7.8* 7.6* --  PHOS 9.0* -- -- 6.8*     Basename 12/27/12 0505 12/26/12 0450  WBC 5.5 8.1  HGB 10.8* 10.8*  HCT 33.1* 32.9*  PLT 125* 119*     Scheduled Meds:   . antiseptic oral rinse  15 mL Mouth Rinse q12n4p  . chlorhexidine  15 mL Mouth Rinse BID  . clindamycin (CLEOCIN) IV  900 mg Intravenous Q6H  . furosemide  80 mg Intravenous Q8H  . insulin aspart  2-6 Units Subcutaneous Q4H  . levothyroxine  76 mcg Intravenous Daily  . piperacillin-tazobactam (ZOSYN)  IV  2.25 g Intravenous Q8H  . vancomycin  2,000 mg Intravenous Q48H   Continuous Infusions:  PRN Meds:.  Assessment/Plan: Pt is a 32 y.o. yo female with a PMHx of morbid obesity, OSA/OHS (not on CPAP at home) who was admitted to [facility] on 12/25/2012 for evaluation of altered mental status, hypercarbia requiring BiPap and Acute renal failure.   #  Acute Renal Failure:  Creatinine from 08/2011 of 1.00. likely ATN from combination of hypoperfusion to the kidneys from poor oral intake, hypotension, ARB, in addition to rhabdomyolysis. No evidence of hydronephrosis on [place].   - improved UOP with lasix 80mg  IV q8: UOP: 1035cc/24hrs. Still +3L since admission with CVP: 17-24, indicating fluid overloaded state. Consider increasing lasix to 80 IV q6. Keep as is right now.  Aim for slightly negative but not too much - Creatinine increased since yesterday, but no acute dialysis needs at this time (no uremia, fluid overload responding to lasix and no gross electrolyte abnormality).  - FeNa: 0.20% indicating pre-renal etiology, but can also be seen in rhabdo.   # Rhabdomyolisis: likely from being down for more than 12 hours prior to admission  - continue lasix  - CK trending down.  - UOP improved s/p lasix  # Hyponatremia: initially 130 on admit. Resolved.  # Respiratory Failure: on intermittent BiPap. New venous congestion on CXR. Per primary team.  # Elevated LFT's: likely from shock liver. Trending down. Hepatitis panel negative. Per primary team  # Cellulitis: on vanc and zosyn, improving. Blood cultures pending. Per primary team  # Acute encephalopathy: resolved.  Marena Chancy, PGY-2  Family Medicine Resident     LOS: 2 days   LOSQ, STEPHANIE 12/27/2012,7:52 AM  Patient seen and examined, agree with above note with above modifications. Making urine with lasix challenge.  Leg looks less angry today.  No indications for HD , now nonoliguric.  Will continue lasix but be careful to not get too negative and follow Annie Sable, MD 12/27/2012

## 2012-12-28 DIAGNOSIS — J9600 Acute respiratory failure, unspecified whether with hypoxia or hypercapnia: Secondary | ICD-10-CM

## 2012-12-28 LAB — BASIC METABOLIC PANEL
Calcium: 8.5 mg/dL (ref 8.4–10.5)
Creatinine, Ser: 9.9 mg/dL — ABNORMAL HIGH (ref 0.50–1.10)
GFR calc non Af Amer: 5 mL/min — ABNORMAL LOW (ref >90)
Glucose, Bld: 125 mg/dL — ABNORMAL HIGH (ref 70–99)
Sodium: 137 mEq/L (ref 135–145)

## 2012-12-28 LAB — GLUCOSE, CAPILLARY
Glucose-Capillary: 118 mg/dL — ABNORMAL HIGH (ref 70–99)
Glucose-Capillary: 126 mg/dL — ABNORMAL HIGH (ref 70–99)
Glucose-Capillary: 99 mg/dL (ref 70–99)

## 2012-12-28 LAB — COMPREHENSIVE METABOLIC PANEL
ALT: 1404 U/L — ABNORMAL HIGH (ref 0–35)
AST: 305 U/L — ABNORMAL HIGH (ref 0–37)
Calcium: 8.4 mg/dL (ref 8.4–10.5)
Creatinine, Ser: 9.5 mg/dL — ABNORMAL HIGH (ref 0.50–1.10)
GFR calc Af Amer: 6 mL/min — ABNORMAL LOW (ref >90)
Sodium: 136 mEq/L (ref 135–145)
Total Protein: 6.7 g/dL (ref 6.0–8.3)

## 2012-12-28 LAB — CK
Total CK: 164 U/L (ref 7–177)
Total CK: 107 U/L (ref 7–177)

## 2012-12-28 LAB — PROTIME-INR: INR: 1.33 (ref 0.00–1.49)

## 2012-12-28 LAB — CBC
MCH: 28.7 pg (ref 26.0–34.0)
MCHC: 33.1 g/dL (ref 30.0–36.0)
Platelets: 141 10*3/uL — ABNORMAL LOW (ref 150–400)

## 2012-12-28 LAB — TROPONIN I: Troponin I: <0.3 ng/mL (ref <0.30)

## 2012-12-28 MED ORDER — GI COCKTAIL ~~LOC~~
30.0000 mL | Freq: Three times a day (TID) | ORAL | Status: DC | PRN
  Administered 2012-12-28: 30 mL via ORAL
  Filled 2012-12-28: qty 30

## 2012-12-28 MED ORDER — HEPARIN SODIUM (PORCINE) 5000 UNIT/ML IJ SOLN
5000.0000 [IU] | Freq: Three times a day (TID) | INTRAMUSCULAR | Status: DC
  Administered 2012-12-28 – 2013-01-06 (×27): 5000 [IU] via SUBCUTANEOUS
  Filled 2012-12-28 (×30): qty 1

## 2012-12-28 MED ORDER — SODIUM CHLORIDE 0.9 % IJ SOLN
10.0000 mL | INTRAMUSCULAR | Status: DC | PRN
  Administered 2012-12-28: 10 mL

## 2012-12-28 MED ORDER — LEVOTHYROXINE SODIUM 150 MCG PO TABS
150.0000 ug | ORAL_TABLET | Freq: Every day | ORAL | Status: DC
  Administered 2012-12-29 – 2013-01-06 (×9): 150 ug via ORAL
  Filled 2012-12-28 (×10): qty 1

## 2012-12-28 MED ORDER — ALBUTEROL SULFATE (5 MG/ML) 0.5% IN NEBU
2.5000 mg | INHALATION_SOLUTION | RESPIRATORY_TRACT | Status: DC | PRN
Start: 2012-12-28 — End: 2013-01-06

## 2012-12-28 MED ORDER — PANTOPRAZOLE SODIUM 40 MG PO TBEC
40.0000 mg | DELAYED_RELEASE_TABLET | Freq: Every day | ORAL | Status: DC
  Administered 2012-12-28 – 2013-01-06 (×10): 40 mg via ORAL
  Filled 2012-12-28 (×9): qty 1

## 2012-12-28 MED ORDER — PANTOPRAZOLE SODIUM 40 MG PO TBEC: 40.0000 mg | DELAYED_RELEASE_TABLET | Freq: Every day | ORAL | Status: DC

## 2012-12-28 MED ORDER — FUROSEMIDE 10 MG/ML IJ SOLN
40.0000 mg | Freq: Two times a day (BID) | INTRAMUSCULAR | Status: DC
  Administered 2012-12-28 – 2013-01-01 (×8): 40 mg via INTRAVENOUS
  Filled 2012-12-28 (×9): qty 4

## 2012-12-28 NOTE — Progress Notes (Signed)
Patient sleeps a lot, with 4L02pmin pt sats sometimes drop to 84-88%.  Keep waking patient to deep breath but goes to sleep quickly and drops again.  02 increased to 4.5Lpm with sats at 90-91%.   Bonney Leitz RN

## 2012-12-28 NOTE — Progress Notes (Signed)
[facility]  Subjective:  Transferred to 5500 yesterday. On BiPap overnight. No major events overnight. No nausea, no vomiting. Tolerating liquids. Shortness of breath stable.  UOP: 1925cc/24hr on lasix 80IV q8   Objective: Vital signs in last 24 hours: Blood pressure 118/71, pulse 67, temperature 98.3 F (36.8 C), temperature source Axillary, resp. rate 16, height 5' 1.81" (1.57 m), weight 413 lb 2.3 oz (187.4 kg), last menstrual period 12/03/2012, SpO2 98.00%.    PHYSICAL EXAM Gen: NAD, on BiPap  Chest: Clear breath sounds. No crackles anteriorly Cardio: regular rate and rhythm, S1, S2 normal, no murmur GI: soft, non-tender; bowel sounds normal Extremities: no pitting edema  Neurologic: No focal deficit, no asterixis Skin: erythema on left leg greatly improved   Lab Results:   Lab 12/28/12 0655 12/27/12 1050 12/27/12 0505 12/26/12 0450  NA 136 141 139 --  K 4.6 5.0 5.0 --  CL 97 101 98 --  CO2 20 21 21  --  BUN 86* 79* 75* --  CREATININE 9.50* 8.33* 8.17* --  ALB -- -- -- --  GLUCOSE 86 -- -- --  CALCIUM 8.4 8.0* 8.0* --  PHOS -- -- 9.0* 6.8*     Basename 12/28/12 0655 12/27/12 0505  WBC 5.9 5.5  HGB 11.9* 10.8*  HCT 35.9* 33.1*  PLT 141* 125*     Scheduled Meds:    . furosemide  80 mg Intravenous Q8H  . insulin aspart  0-15 Units Subcutaneous TID WC  . insulin aspart  0-5 Units Subcutaneous QHS  . levothyroxine  76 mcg Intravenous Daily  . piperacillin-tazobactam (ZOSYN)  IV  2.25 g Intravenous Q8H  . pneumococcal 23 valent vaccine  0.5 mL Intramuscular Tomorrow-1000   Continuous Infusions:    . sodium chloride 20 mL/hr at 12/27/12 2112   PRN Meds:.  Assessment/Plan: Pt is a 32 y.o. yo female with a PMHx of morbid obesity, OSA/OHS (not on CPAP at home) who was admitted to [facility] on 12/25/2012 for evaluation of altered mental status, hypercarbia requiring BiPap and Acute renal failure.   # Acute Renal Failure:  Creatinine from 08/2011  of 1.00. likely ATN from combination of hypoperfusion to the kidneys from poor oral intake, hypotension, ARB, in addition to rhabdomyolysis. No evidence of hydronephrosis on [place].   - Creatinine and BUN continuing to trend up. Consider decreasing lasix to avoid too great of a negative balance. Making urine. Agree- will decrease lasix to 40 IV BID.  Hopefully is trying to plateau... - no acute HD needs today - FeNa: 0.20% indicating pre-renal etiology, but can also be seen in rhabdo.   # Rhabdomyolisis: likely from being down for more than 12 hours prior to admission  - consider decreasing lasix  - CK trending down.  - UOP improved s/p lasix  # Hyponatremia: initially 130 on admit. Resolved.  # Respiratory Failure: on intermittent BiPap. New venous congestion on CXR. Per primary team.  # Elevated LFT's: likely from shock liver. Trending down. Hepatitis panel negative. Per primary team  # Cellulitis: on vanc and zosyn, improving. Blood cultures pending. Per primary team  # Acute encephalopathy: resolved.  Marena Chancy, PGY-2  Family Medicine Resident     LOS: 3 days   Marena Chancy 12/28/2012,8:47 AM  Patient seen and examined, agree with above note with above modifications. Still nonoliguric.  Creatinine did go up but patient does not appear to be uremic.  No indications for HD.  Will decrease lasix and hope for plateau Annie Sable,  MD 12/28/2012

## 2012-12-28 NOTE — Progress Notes (Signed)
  Echocardiogram 2D Echocardiogram has been performed.  Alexandra Beck 12/28/2012, 11:26 AM

## 2012-12-28 NOTE — Progress Notes (Signed)
Pt. C/O chest tightness to NT, VSS stable, lungs sounds was clear.  Dr. Jerral Ralph notified & orders given for EKG, labs, GI cocktail & protonix.  EKG showed NSR and meds given will await for lab results and continue to monitor.  Forbes Cellar, RN

## 2012-12-28 NOTE — Progress Notes (Signed)
ANTIBIOTIC CONSULT NOTE-PROGRESS NOTE  Pharmacy Consult for Zosyn Indication: Cellulitis  Hospital Problems Active Problems:  Morbid obesity with BMI of 70 and over, adult  Acute-on-chronic respiratory failure  Acute-on-chronic renal failure  Acute encephalopathy  Cellulitis  Hyponatremia  Hypovolemia  OSA (obstructive sleep apnea)  Obesity hypoventilation syndrome  Patient Measurements: Height: 5' 1.81" (157 cm) Weight: 413 lb 2.3 oz (187.4 kg) IBW/kg (Calculated) : 49.67   Vital Signs: BP 118/71  Pulse 67  Temp 98.3 F (36.8 C) (Axillary)  Resp 16  Ht 5' 1.81" (1.57 m)  Wt 413 lb 2.3 oz (187.4 kg)  BMI 76.03 kg/m2  SpO2 98%  LMP 12/03/2012  Labs:  Basename 12/28/12 0655 12/27/12 1050 12/27/12 0505 12/26/12 0450  WBC 5.9 -- 5.5 8.1  HGB 11.9* -- 10.8* 10.8*  PLT 141* -- 125* 119*  LABCREA -- -- -- --  CREATININE 9.50* 8.33* 8.17* --   Estimated Creatinine Clearance: 14.1 ml/min (by C-G formula based on Cr of 9.5).   Microbiology: Recent Results (from the past 720 hour(s))  CULTURE, BLOOD (ROUTINE X 2)     Status: Normal (Preliminary result)   Collection Time   12/25/12  1:10 AM      Component Value Range Status Comment   Specimen Description BLOOD RIGHT ARM   Final    Special Requests BOTTLES DRAWN AEROBIC AND ANAEROBIC 10CC EA   Final    Culture  Setup Time 12/25/2012 04:54   Final    Culture     Final    Value:        BLOOD CULTURE RECEIVED NO GROWTH TO DATE CULTURE WILL BE HELD FOR 5 DAYS BEFORE ISSUING A FINAL NEGATIVE REPORT   Report Status PENDING   Incomplete   CULTURE, BLOOD (ROUTINE X 2)     Status: Normal (Preliminary result)   Collection Time   12/25/12  1:25 AM      Component Value Range Status Comment   Specimen Description BLOOD RIGHT HAND   Final    Special Requests BOTTLES DRAWN AEROBIC ONLY 10CC   Final    Culture  Setup Time 12/25/2012 04:54   Final    Culture     Final    Value:        BLOOD CULTURE RECEIVED NO GROWTH TO DATE CULTURE  WILL BE HELD FOR 5 DAYS BEFORE ISSUING A FINAL NEGATIVE REPORT   Report Status PENDING   Incomplete   MRSA PCR SCREENING     Status: Normal   Collection Time   12/25/12  5:48 AM      Component Value Range Status Comment   MRSA by PCR NEGATIVE  NEGATIVE Final   URINE CULTURE     Status: Normal   Collection Time   12/25/12  6:16 AM      Component Value Range Status Comment   Specimen Description URINE, CATHETERIZED   Final    Special Requests NONE   Final    Culture  Setup Time 12/25/2012 06:52   Final    Colony Count NO GROWTH   Final    Culture NO GROWTH   Final    Report Status 12/26/2012 FINAL   Final     Current Anti-infectives Anti-infectives     Start     Dose/Rate Route Frequency Ordered Stop   12/26/12 1400   piperacillin-tazobactam (ZOSYN) IVPB 2.25 g        2.25 g 100 mL/hr over 30 Minutes Intravenous 3 times per day 12/26/12  1329            Assessment:  Day # 5 of antibiotics, Day # 4 of Zosyn for Cellulitis.  Patient is currently receiving 2.25 gm IV q 8 hours.  CrCl 16 ml/min, continuing to worsen  Goal of Therapy:  Renal adjusted Zosyn dosing.  Plan:   Continue Zosyn 2.25 gm IV q 8 hours.  Follow up Cultures/sensitivities.  Laurena Bering, Pharm.D.  12/28/2012 8:38 AM

## 2012-12-28 NOTE — Progress Notes (Signed)
Physical Therapy Treatment Patient Details Name: Alexandra Beck MRN: [id number] DOB: 13-Oct-1981 Today's Date: 12/28/2012 Time: 1191-4782 PT Time Calculation (min): 23 min  PT Assessment / Plan / Recommendation Comments on Treatment Session  Pt adm with resp failure.  Pt making steady progress.    Follow Up Recommendations  Home health PT;Supervision - Intermittent     Does the patient have the potential to tolerate intense rehabilitation     Barriers to Discharge        Equipment Recommendations  Rolling walker with 5" wheels (bariatric)    Recommendations for Other Services    Frequency Min 3X/week   Plan Frequency remains appropriate;Discharge plan remains appropriate    Precautions / Restrictions Precautions Precautions: Fall   Pertinent Vitals/Pain Pt c/o chest soreness.    Mobility  Bed Mobility Rolling Right: 5: Supervision;With rail Right Sidelying to Sit: 4: Min assist;With rails;HOB flat Transfers Sit to Stand: 1: +2 Total assist Sit to Stand: Patient Percentage: 90% Stand to Sit: 4: Min assist;With upper extremity assist;To bed Details for Transfer Assistance: Pt returns to bed by bringing lt foot up onto bed as she is sitting down. Ambulation/Gait Ambulation/Gait Assistance: 1: +2 Total assist Ambulation/Gait: Patient Percentage: 90% Ambulation Distance (Feet): 100 Feet Assistive device: 2 person hand held assist Gait Pattern: Wide base of support;Step-through pattern;Decreased stride length    Exercises     PT Diagnosis:    PT Problem List:   PT Treatment Interventions:     PT Goals Acute Rehab PT Goals PT Goal: Supine/Side to Sit - Progress: Progressing toward goal PT Goal: Sit to Stand - Progress: Progressing toward goal PT Transfer Goal: Bed to Chair/Chair to Bed - Progress: Progressing toward goal PT Goal: Ambulate - Progress: Progressing toward goal  Visit Information  Last PT Received On: 12/28/12 Assistance Needed: +2 (for lines and safety  )    Subjective Data  Subjective: Pt stated she wanted to walk in the hall.   Cognition  Overall Cognitive Status: Appears within functional limits for tasks assessed/performed Arousal/Alertness: Awake/alert Orientation Level: Appears intact for tasks assessed Behavior During Session: [facility] for tasks performed    Balance  Static Standing Balance Static Standing - Balance Support: No upper extremity supported Static Standing - Level of Assistance: 4: Min assist  End of Session PT - End of Session Equipment Utilized During Treatment: Oxygen (pt too large for gait belt.) Activity Tolerance: Patient tolerated treatment well Patient left: in bed;with call bell/phone within reach;with family/visitor present Nurse Communication: Mobility status   GP     Alexandra Beck 12/28/2012, 12:26 PM  [employer] PT [phone]

## 2012-12-28 NOTE — Progress Notes (Signed)
PATIENT DETAILS Name: Alexandra Beck Age: 32 y.o. Sex: female Date of Birth: 01-24-1981 Admit Date: 12/25/2012 Admitting Physician Lonia Farber, MD AVW:UJWJX,BJYNW J, MD  Subjective: Seen and examined- sleeping comfortably during my rounds this morning. No major complaints.  Assessment/Plan: Active Problems: Acute on chronic respiratory failure  -in setting of OSA/OHS, CPAP/BiPAP noncompliance, opioids. - Will ask case management to make sure patient has nocturnal BiPAP on discharge  Acute on chronic renal failure - Likely acute tubular necrosis- from multifactorial causes- hypotension, diuretics, ARB's and rhabdomyolysis - Creatinine increasing, per nephrology note-hopefully she will plateau soon - Diuretics per nephrology  Acute encephalopathy - Secondary to above - Has resolved  Rhabdomyolysis - CK has normalized  Transaminitis - Likely secondary to shock liver and rhabdomyolysis - Downtrending- monitor periodically  Left lower extremity cellulitis - Erythema has significantly come down from the marked area - Dopplers of the left lower extremity done on 1/28-negative for DVT - CT scan of the left leg than on 1/28-showed cellulitis without myofascitis or osteomyelitis or abscess - Continue with Zosyn  Diabetes - Continue with SSI - Metformin currently contraindicated with renal failure - A1c 6.2  Hypothyroid - Continue with levothyroxine  Hypertension - Hold metoprolol-currently on Lasix  Dyslipidemia - Hold statins secondary to resolve her abdomen and significantly elevated transaminases  Morbid obesity with BMI of 70 and over, adult - Counseled extensively regarding the importance of weight loss  Disposition: Remain inpatient  DVT Prophylaxis: Start Prophylactic  Heparin  Code Status: Full code  Procedures:  None  CONSULTS:  pulmonary/intensive care and nephrology  PHYSICAL EXAM: Vital signs in last 24 hours: Filed Vitals:   12/27/12 2150 12/27/12 2244 12/28/12 0449 12/28/12 1200  BP: 125/83  118/71   Pulse: 61 65 67   Temp: 98.7 F (37.1 C)  98.3 F (36.8 C)   TempSrc: Oral  Axillary   Resp: 20 20 16    Height:      Weight:   187.4 kg (413 lb 2.3 oz)   SpO2: 100% 100% 98% 96%    Weight change: -1.6 kg (-3 lb 8.4 oz) Body mass index is 76.03 kg/(m^2).   Gen Exam: Awake and alert with clear speech.   Neck: Supple, No JVD.   Chest: B/L Clear.   CVS: S1 S2 Regular, no murmurs.  Abdomen: soft, BS +, non tender, non distended.  Extremities: no edema, lower extremities warm to touch. Erythema-in the lower mid calf area of the left lower extremity. Nontender left lower extremity, sensation is intact Neurologic: Non Focal.   Skin: No Rash.   Wounds: N/A.   Intake/Output from previous day:  Intake/Output Summary (Last 24 hours) at 12/28/12 1339 Last data filed at 12/28/12 0500  Gross per 24 hour  Intake    370 ml  Output   1400 ml  Net  -1030 ml     LAB RESULTS: CBC  Lab 12/28/12 0655 12/27/12 0505 12/26/12 0450 12/25/12 0126 12/24/12 0024 12/23/12 2054  WBC 5.9 5.5 8.1 10.3 -- 9.8  HGB 11.9* 10.8* 10.8* 12.7 14.3 --  HCT 35.9* 33.1* 32.9* 39.4 42.0 --  PLT 141* 125* 119* 152 -- --  MCV 86.5 88.3 88.4 88.7 -- 91.7  MCH 28.7 28.8 29.0 28.6 -- 32.1*  MCHC 33.1 32.6 32.8 32.2 -- 35.0  RDW 14.4 14.5 14.5 14.7 -- --  LYMPHSABS -- -- -- 1.7 -- --  MONOABS -- -- -- 0.6 -- --  EOSABS -- -- -- 0.0 -- --  BASOSABS -- -- -- 0.0 -- --  BANDABS -- -- -- -- -- --    Chemistries   Lab 12/28/12 0655 12/27/12 1050 12/27/12 0505 12/26/12 2240 12/26/12 1653 12/26/12 0450  NA 136 141 139 140 140 --  K 4.6 5.0 5.0 5.2* 4.9 --  CL 97 101 98 102 102 --  CO2 20 21 21 23 23  --  GLUCOSE 86 82 82 87 88 --  BUN 86* 79* 75* 72* 70* --  CREATININE 9.50* 8.33* 8.17* 7.62* 7.09* --  CALCIUM 8.4 8.0* 8.0* 7.8* 7.6* --  MG -- -- 2.5 -- -- 1.9    CBG:  Lab 12/28/12 1231 12/28/12 0746 12/27/12 2147 12/27/12  1634 12/27/12 1138  GLUCAP 118* 89 101* 107* 73    GFR Estimated Creatinine Clearance: 14.1 ml/min (by C-G formula based on Cr of 9.5).  Coagulation profile  Lab 12/28/12 0655 12/25/12 0620  INR 1.33 1.70*  PROTIME -- --    Cardiac Enzymes No results found for this basename: CK:3,CKMB:3,TROPONINI:3,MYOGLOBIN:3 in the last 168 hours  No components found with this basename: POCBNP:3 No results found for this basename: DDIMER:2 in the last 72 hours No results found for this basename: HGBA1C:2 in the last 72 hours No results found for this basename: CHOL:2,HDL:2,LDLCALC:2,TRIG:2,CHOLHDL:2,LDLDIRECT:2 in the last 72 hours No results found for this basename: TSH,T4TOTAL,FREET3,T3FREE,THYROIDAB in the last 72 hours No results found for this basename: VITAMINB12:2,FOLATE:2,FERRITIN:2,TIBC:2,IRON:2,RETICCTPCT:2 in the last 72 hours No results found for this basename: LIPASE:2,AMYLASE:2 in the last 72 hours  Urine Studies No results found for this basename: UACOL:2,UAPR:2,USPG:2,UPH:2,UTP:2,UGL:2,UKET:2,UBIL:2,UHGB:2,UNIT:2,UROB:2,ULEU:2,UEPI:2,UWBC:2,URBC:2,UBAC:2,CAST:2,CRYS:2,UCOM:2,BILUA:2 in the last 72 hours  MICROBIOLOGY: Recent Results (from the past 240 hour(s))  CULTURE, BLOOD (ROUTINE X 2)     Status: Normal (Preliminary result)   Collection Time   12/25/12  1:10 AM      Component Value Range Status Comment   Specimen Description BLOOD RIGHT ARM   Final    Special Requests BOTTLES DRAWN AEROBIC AND ANAEROBIC 10CC EA   Final    Culture  Setup Time 12/25/2012 04:54   Final    Culture     Final    Value:        BLOOD CULTURE RECEIVED NO GROWTH TO DATE CULTURE WILL BE HELD FOR 5 DAYS BEFORE ISSUING A FINAL NEGATIVE REPORT   Report Status PENDING   Incomplete   CULTURE, BLOOD (ROUTINE X 2)     Status: Normal (Preliminary result)   Collection Time   12/25/12  1:25 AM      Component Value Range Status Comment   Specimen Description BLOOD RIGHT HAND   Final    Special Requests  BOTTLES DRAWN AEROBIC ONLY 10CC   Final    Culture  Setup Time 12/25/2012 04:54   Final    Culture     Final    Value:        BLOOD CULTURE RECEIVED NO GROWTH TO DATE CULTURE WILL BE HELD FOR 5 DAYS BEFORE ISSUING A FINAL NEGATIVE REPORT   Report Status PENDING   Incomplete   MRSA PCR SCREENING     Status: Normal   Collection Time   12/25/12  5:48 AM      Component Value Range Status Comment   MRSA by PCR NEGATIVE  NEGATIVE Final   URINE CULTURE     Status: Normal   Collection Time   12/25/12  6:16 AM      Component Value Range Status Comment   Specimen Description URINE, CATHETERIZED  Final    Special Requests NONE   Final    Culture  Setup Time 12/25/2012 06:52   Final    Colony Count NO GROWTH   Final    Culture NO GROWTH   Final    Report Status 12/26/2012 FINAL   Final     RADIOLOGY STUDIES/RESULTS: Ct Femur Left Wo Contrast  12/25/2012  *RADIOLOGY REPORT*  Clinical Data: Pain and swelling.  CT OF THE LEFT FEMUR WITHOUT CONTRAST,CT TIBIA FIBULA LEFT WITHOUT CONTRAST  Comparison: None.  Findings: Mild diffuse cellulitis involving the left lower extremity most notably in the mid distal tibia / fibula region.  No discrete soft tissue abscess or findings for myofasciitis.  The bony structures are intact.  No findings to suggest septic arthritis or osteomyelitis.  Slightly enlarged / inflamed left inguinal lymph nodes are noted.  IMPRESSION: Cellulitis without findings for focal soft tissue abscess, myofasciitis, septic arthritis or osteomyelitis.   Original Report Authenticated By: Rudie Meyer, M.D.    Ct Tibia Fibula Left Wo Contrast  12/25/2012  *RADIOLOGY REPORT*  Clinical Data: Pain and swelling.  CT OF THE LEFT FEMUR WITHOUT CONTRAST,CT TIBIA FIBULA LEFT WITHOUT CONTRAST  Comparison: None.  Findings: Mild diffuse cellulitis involving the left lower extremity most notably in the mid distal tibia / fibula region.  No discrete soft tissue abscess or findings for myofasciitis.  The bony  structures are intact.  No findings to suggest septic arthritis or osteomyelitis.  Slightly enlarged / inflamed left inguinal lymph nodes are noted.  IMPRESSION: Cellulitis without findings for focal soft tissue abscess, myofasciitis, septic arthritis or osteomyelitis.   Original Report Authenticated By: Rudie Meyer, M.D.    US Renal Port  12/25/2012  *RADIOLOGY REPORT*  Clinical Data: Worsening renal function.  RENAL/URINARY TRACT ULTRASOUND COMPLETE  Comparison:  MRI 12/28/2005  Findings:  Right Kidney:  12.0 cm. Normal size and echotexture.  No focal abnormality.  No hydronephrosis.  Left Kidney:  12.8 cm. Normal size and echotexture.  No focal abnormality.  No hydronephrosis.  Bladder:  Not visualized.  IMPRESSION: No acute renal abnormality.  No hydronephrosis.  Nonvisualization of the bladder.   Original Report Authenticated By: Charlett Nose, M.D.    Dg Chest Port 1 View  12/27/2012  *RADIOLOGY REPORT*  Clinical Data: Respiratory failure.  Shortness of breath.  PORTABLE CHEST - 1 VIEW  Comparison: Chest 12/26/2012 and 12/25/2012.  PA and lateral chest 09/16/2011.  Findings: Right IJ catheter remains in place.  There is cardiomegaly and mild vascular congestion.  No consolidative process, pneumothorax or effusion.  IMPRESSION: Cardiomegaly and mild vascular congestion.   Original Report Authenticated By: Holley Dexter, M.D.    Dg Chest Port 1 View  12/26/2012  *RADIOLOGY REPORT*  Clinical Data: Shortness of breath.  Respiratory failure.  PORTABLE CHEST - 1 VIEW  Comparison: 12/25/2012  Findings: Central line tip is in the superior vena cava in good position.  There is new slight pulmonary vascular congestion.  No consolidative infiltrates or effusions.  The heart size is normal.  IMPRESSION: New pulmonary vascular congestion.   Original Report Authenticated By: Francene Boyers, M.D.    Dg Chest Port 1 View  12/25/2012  *RADIOLOGY REPORT*  Clinical Data: Central line placement  PORTABLE CHEST - 1  VIEW  Comparison: Prior chest x-ray performed earlier today, 12/25/2012 at 01:23 a.m.  Findings: Interval placement of a right IJ central venous catheter. The tip projects over the superior cavoatrial junction.  No evidence of complicating pneumothorax.  Inspiratory volumes are low and there is increased bibasilar atelectasis.  Stable cardiomegaly. No acute osseous abnormality.  Mild pulmonary vascular congestion.  IMPRESSION:  1.  Interval placement of a right IJ central venous catheter without evidence of complicating pneumothorax.  The tip of the catheter projects over the superior cavoatrial junction. 2.  Lower inspiratory volumes with increased bibasilar atelectasis 3.  Mild pulmonary vascular congestion   Original Report Authenticated By: Malachy Moan, M.D.    Dg Chest Port 1 View  12/25/2012  *RADIOLOGY REPORT*  Clinical Data: Hypoxia, shortness of breath.  PORTABLE CHEST - 1 VIEW  Comparison: 09/16/2011  Findings: Hypoaeration.  Heart size upper normal to mildly enlarged.  Mild bibasilar opacities.  No definite pleural effusion or pneumothorax.  No acute osseous finding.  IMPRESSION: Hypoaeration with mild bibasilar opacities; atelectasis versus early infiltrate.   Original Report Authenticated By: Jearld Lesch, M.D.     MEDICATIONS: Scheduled Meds:   . furosemide  40 mg Intravenous BID  . insulin aspart  0-15 Units Subcutaneous TID WC  . insulin aspart  0-5 Units Subcutaneous QHS  . levothyroxine  76 mcg Intravenous Daily  . piperacillin-tazobactam (ZOSYN)  IV  2.25 g Intravenous Q8H   Continuous Infusions:   . sodium chloride 20 mL/hr at 12/27/12 2112   PRN Meds:.  Antibiotics: Anti-infectives     Start     Dose/Rate Route Frequency Ordered Stop   12/26/12 2200   vancomycin (VANCOCIN) 2,000 mg in sodium chloride 0.9 % 500 mL IVPB  Status:  Discontinued        2,000 mg 250 mL/hr over 120 Minutes Intravenous Every 48 hours 12/26/12 1328 12/27/12 1133   12/26/12 1400   piperacillin-tazobactam (ZOSYN) IVPB 2.25 g       2.25 g 100 mL/hr over 30 Minutes Intravenous 3 times per day 12/26/12 1329     12/25/12 1200   clindamycin (CLEOCIN) IVPB 900 mg  Status:  Discontinued        900 mg 100 mL/hr over 30 Minutes Intravenous 4 times per day 12/25/12 1003 12/27/12 1133   12/25/12 0600   piperacillin-tazobactam (ZOSYN) IVPB 3.375 g  Status:  Discontinued        3.375 g 12.5 mL/hr over 240 Minutes Intravenous 3 times per day 12/25/12 0428 12/26/12 1329   12/25/12 0445   vancomycin (VANCOCIN) 1,500 mg in sodium chloride 0.9 % 500 mL IVPB        1,500 mg 250 mL/hr over 120 Minutes Intravenous  Once 12/25/12 0427 12/25/12 1317   12/25/12 0115   clindamycin (CLEOCIN) IVPB 900 mg        900 mg 100 mL/hr over 30 Minutes Intravenous  Once 12/25/12 0105 12/25/12 0305   12/25/12 0115   vancomycin (VANCOCIN) IVPB 1000 mg/200 mL premix        1,000 mg 200 mL/hr over 60 Minutes Intravenous  Once 12/25/12 0105 12/25/12 0459           Jeoffrey Massed, MD  [facility] Pager:[phone]  If 7PM-7AM, please contact night-coverage [email or web address] Password [email or web address] 12/28/2012, 1:39 PM   LOS: 3 days

## 2012-12-29 LAB — BASIC METABOLIC PANEL
BUN: 93 mg/dL — ABNORMAL HIGH (ref 6–23)
Creatinine, Ser: 10.48 mg/dL — ABNORMAL HIGH (ref 0.50–1.10)
GFR calc Af Amer: 5 mL/min — ABNORMAL LOW (ref >90)
GFR calc non Af Amer: 4 mL/min — ABNORMAL LOW (ref >90)
Potassium: 4.5 mEq/L (ref 3.5–5.1)

## 2012-12-29 LAB — CBC
HCT: 34.7 % — ABNORMAL LOW (ref 36.0–46.0)
Hemoglobin: 11.6 g/dL — ABNORMAL LOW (ref 12.0–15.0)
MCH: 28.6 pg (ref 26.0–34.0)
MCHC: 33.4 g/dL (ref 30.0–36.0)
RDW: 14.5 % (ref 11.5–15.5)

## 2012-12-29 LAB — COMPREHENSIVE METABOLIC PANEL
Albumin: 2.5 g/dL — ABNORMAL LOW (ref 3.5–5.2)
Alkaline Phosphatase: 65 U/L (ref 39–117)
BUN: 92 mg/dL — ABNORMAL HIGH (ref 6–23)
Calcium: 8.4 mg/dL (ref 8.4–10.5)
Creatinine, Ser: 10.02 mg/dL — ABNORMAL HIGH (ref 0.50–1.10)
GFR calc Af Amer: 5 mL/min — ABNORMAL LOW (ref >90)
Glucose, Bld: 92 mg/dL (ref 70–99)
Potassium: 4.6 mEq/L (ref 3.5–5.1)
Total Protein: 6.6 g/dL (ref 6.0–8.3)

## 2012-12-29 LAB — TROPONIN I: Troponin I: <0.3 ng/mL (ref <0.30)

## 2012-12-29 LAB — GLUCOSE, CAPILLARY
Glucose-Capillary: 101 mg/dL — ABNORMAL HIGH (ref 70–99)
Glucose-Capillary: 128 mg/dL — ABNORMAL HIGH (ref 70–99)

## 2012-12-29 NOTE — Progress Notes (Signed)
PATIENT DETAILS Name: Alexandra Beck Age: 32 y.o. Sex: female Date of Birth: 13-Aug-1981 Admit Date: 12/25/2012 Admitting Physician Lonia Farber, MD ZOX:[id number] J, MD  Subjective: Seen and examined- had intermittent chest discomfort yesterday-now resolved today  Assessment/Plan: Active Problems: Acute on chronic respiratory failure  -in setting of OSA/OHS, CPAP/BiPAP noncompliance, opioids. - Will ask case management to make sure patient has nocturnal BiPAP on discharge  Acute on chronic renal failure - Likely acute tubular necrosis- from multifactorial causes- hypotension, diuretics, ARB's and rhabdomyolysis - Creatinine increasing, per nephrology note-hopefully she will plateau soon - Diuretics per nephrology  Acute encephalopathy - Secondary to above - Has resolved  Chest Discomfort - Suspect GI etiology- now on PPI -Cardiac enzymes negative  Rhabdomyolysis - CK has normalized  Transaminitis - Likely secondary to shock liver and rhabdomyolysis - Downtrending- monitor periodically  Left lower extremity cellulitis - Erythema has significantly come down from the marked area - Dopplers of the left lower extremity done on 1/28-negative for DVT - CT scan of the left leg than on 1/28-showed cellulitis without myofascitis or osteomyelitis or abscess - Continue with Zosyn  Diabetes - Continue with SSI - Metformin currently contraindicated with renal failure - A1c 6.2  Hypothyroid - Continue with levothyroxine  Hypertension - Hold metoprolol-currently on Lasix  Dyslipidemia - Hold statins secondary to resolve her abdomen and significantly elevated transaminases  Morbid obesity with BMI of 70 and over, adult - Counseled extensively regarding the importance of weight loss  Disposition: Remain inpatient  DVT Prophylaxis: Start Prophylactic  Heparin  Code Status: Full code  Procedures:  None  CONSULTS:  pulmonary/intensive care and  nephrology  PHYSICAL EXAM: Vital signs in last 24 hours: Filed Vitals:   12/28/12 2136 12/29/12 0036 12/29/12 0323 12/29/12 0523  BP: 133/62   132/72  Pulse: 77 78  71  Temp: 98.4 F (36.9 C)   98.4 F (36.9 C)  TempSrc: Oral   Axillary  Resp: 20 18  18   Height:      Weight:   178.5 kg (393 lb 8.3 oz)   SpO2: 96%   97%    Weight change: -8.9 kg (-19 lb 9.9 oz) Body mass index is 72.42 kg/(m^2).   Gen Exam: Awake and alert with clear speech.   Neck: Supple, No JVD.   Chest: B/L Clear.  But decreased air entry given body habitus CVS: S1 S2 Regular, no murmurs.  Abdomen: soft, BS +, non tender, non distended.  Extremities: no edema, lower extremities warm to touch. Erythema-in the lower mid calf area of the left lower extremity. Nontender left lower extremity, sensation is intact Neurologic: Non Focal.   Skin: No Rash.   Wounds: N/A.   Intake/Output from previous day:  Intake/Output Summary (Last 24 hours) at 12/29/12 1143 Last data filed at 12/29/12 0500  Gross per 24 hour  Intake   1130 ml  Output   1475 ml  Net   -345 ml     LAB RESULTS: CBC  Lab 12/29/12 0250 12/28/12 0655 12/27/12 0505 12/26/12 0450 12/25/12 0126  WBC 7.5 5.9 5.5 8.1 10.3  HGB 11.6* 11.9* 10.8* 10.8* 12.7  HCT 34.7* 35.9* 33.1* 32.9* 39.4  PLT 166 141* 125* 119* 152  MCV 85.7 86.5 88.3 88.4 88.7  MCH 28.6 28.7 28.8 29.0 28.6  MCHC 33.4 33.1 32.6 32.8 32.2  RDW 14.5 14.4 14.5 14.5 14.7  LYMPHSABS -- -- -- -- 1.7  MONOABS -- -- -- -- 0.6  EOSABS -- -- -- --  0.0  BASOSABS -- -- -- -- 0.0  BANDABS -- -- -- -- --    Chemistries   Lab 12/29/12 0250 12/28/12 1730 12/28/12 0655 12/27/12 1050 12/27/12 0505 12/26/12 0450  NA 136 137 136 141 139 --  K 4.6 4.5 4.6 5.0 5.0 --  CL 96 96 97 101 98 --  CO2 23 22 20 21 21  --  GLUCOSE 92 125* 86 82 82 --  BUN 92* 90* 86* 79* 75* --  CREATININE 10.02* 9.90* 9.50* 8.33* 8.17* --  CALCIUM 8.4 8.5 8.4 8.0* 8.0* --  MG -- -- -- -- 2.5 1.9     CBG:  Lab 12/29/12 0744 12/28/12 2141 12/28/12 1725 12/28/12 1231 12/28/12 0746  GLUCAP 93 99 126* 118* 89    GFR Estimated Creatinine Clearance: 12.9 ml/min (by C-G formula based on Cr of 10.02).  Coagulation profile  Lab 12/28/12 0655 12/25/12 0620  INR 1.33 1.70*  PROTIME -- --    Cardiac Enzymes  Lab 12/29/12 0250 12/28/12 1959  CKMB -- --  TROPONINI <0.30 <0.30  MYOGLOBIN -- --    No components found with this basename: POCBNP:3 No results found for this basename: DDIMER:2 in the last 72 hours No results found for this basename: HGBA1C:2 in the last 72 hours No results found for this basename: CHOL:2,HDL:2,LDLCALC:2,TRIG:2,CHOLHDL:2,LDLDIRECT:2 in the last 72 hours No results found for this basename: TSH,T4TOTAL,FREET3,T3FREE,THYROIDAB in the last 72 hours No results found for this basename: VITAMINB12:2,FOLATE:2,FERRITIN:2,TIBC:2,IRON:2,RETICCTPCT:2 in the last 72 hours No results found for this basename: LIPASE:2,AMYLASE:2 in the last 72 hours  Urine Studies No results found for this basename: UACOL:2,UAPR:2,USPG:2,UPH:2,UTP:2,UGL:2,UKET:2,UBIL:2,UHGB:2,UNIT:2,UROB:2,ULEU:2,UEPI:2,UWBC:2,URBC:2,UBAC:2,CAST:2,CRYS:2,UCOM:2,BILUA:2 in the last 72 hours  MICROBIOLOGY: Recent Results (from the past 240 hour(s))  CULTURE, BLOOD (ROUTINE X 2)     Status: Normal (Preliminary result)   Collection Time   12/25/12  1:10 AM      Component Value Range Status Comment   Specimen Description BLOOD RIGHT ARM   Final    Special Requests BOTTLES DRAWN AEROBIC AND ANAEROBIC 10CC EA   Final    Culture  Setup Time 12/25/2012 04:54   Final    Culture     Final    Value:        BLOOD CULTURE RECEIVED NO GROWTH TO DATE CULTURE WILL BE HELD FOR 5 DAYS BEFORE ISSUING A FINAL NEGATIVE REPORT   Report Status PENDING   Incomplete   CULTURE, BLOOD (ROUTINE X 2)     Status: Normal (Preliminary result)   Collection Time   12/25/12  1:25 AM      Component Value Range Status Comment    Specimen Description BLOOD RIGHT HAND   Final    Special Requests BOTTLES DRAWN AEROBIC ONLY 10CC   Final    Culture  Setup Time 12/25/2012 04:54   Final    Culture     Final    Value:        BLOOD CULTURE RECEIVED NO GROWTH TO DATE CULTURE WILL BE HELD FOR 5 DAYS BEFORE ISSUING A FINAL NEGATIVE REPORT   Report Status PENDING   Incomplete   MRSA PCR SCREENING     Status: Normal   Collection Time   12/25/12  5:48 AM      Component Value Range Status Comment   MRSA by PCR NEGATIVE  NEGATIVE Final   URINE CULTURE     Status: Normal   Collection Time   12/25/12  6:16 AM      Component Value Range Status  Comment   Specimen Description URINE, CATHETERIZED   Final    Special Requests NONE   Final    Culture  Setup Time 12/25/2012 06:52   Final    Colony Count NO GROWTH   Final    Culture NO GROWTH   Final    Report Status 12/26/2012 FINAL   Final     RADIOLOGY STUDIES/RESULTS: Ct Femur Left Wo Contrast  12/25/2012  *RADIOLOGY REPORT*  Clinical Data: Pain and swelling.  CT OF THE LEFT FEMUR WITHOUT CONTRAST,CT TIBIA FIBULA LEFT WITHOUT CONTRAST  Comparison: None.  Findings: Mild diffuse cellulitis involving the left lower extremity most notably in the mid distal tibia / fibula region.  No discrete soft tissue abscess or findings for myofasciitis.  The bony structures are intact.  No findings to suggest septic arthritis or osteomyelitis.  Slightly enlarged / inflamed left inguinal lymph nodes are noted.  IMPRESSION: Cellulitis without findings for focal soft tissue abscess, myofasciitis, septic arthritis or osteomyelitis.   Original Report Authenticated By: Rudie Meyer, M.D.    Ct Tibia Fibula Left Wo Contrast  12/25/2012  *RADIOLOGY REPORT*  Clinical Data: Pain and swelling.  CT OF THE LEFT FEMUR WITHOUT CONTRAST,CT TIBIA FIBULA LEFT WITHOUT CONTRAST  Comparison: None.  Findings: Mild diffuse cellulitis involving the left lower extremity most notably in the mid distal tibia / fibula region.  No  discrete soft tissue abscess or findings for myofasciitis.  The bony structures are intact.  No findings to suggest septic arthritis or osteomyelitis.  Slightly enlarged / inflamed left inguinal lymph nodes are noted.  IMPRESSION: Cellulitis without findings for focal soft tissue abscess, myofasciitis, septic arthritis or osteomyelitis.   Original Report Authenticated By: Rudie Meyer, M.D.    US Renal Port  12/25/2012  *RADIOLOGY REPORT*  Clinical Data: Worsening renal function.  RENAL/URINARY TRACT ULTRASOUND COMPLETE  Comparison:  MRI 12/28/2005  Findings:  Right Kidney:  12.0 cm. Normal size and echotexture.  No focal abnormality.  No hydronephrosis.  Left Kidney:  12.8 cm. Normal size and echotexture.  No focal abnormality.  No hydronephrosis.  Bladder:  Not visualized.  IMPRESSION: No acute renal abnormality.  No hydronephrosis.  Nonvisualization of the bladder.   Original Report Authenticated By: Charlett Nose, M.D.    Dg Chest Port 1 View  12/27/2012  *RADIOLOGY REPORT*  Clinical Data: Respiratory failure.  Shortness of breath.  PORTABLE CHEST - 1 VIEW  Comparison: Chest 12/26/2012 and 12/25/2012.  PA and lateral chest 09/16/2011.  Findings: Right IJ catheter remains in place.  There is cardiomegaly and mild vascular congestion.  No consolidative process, pneumothorax or effusion.  IMPRESSION: Cardiomegaly and mild vascular congestion.   Original Report Authenticated By: Holley Dexter, M.D.    Dg Chest Port 1 View  12/26/2012  *RADIOLOGY REPORT*  Clinical Data: Shortness of breath.  Respiratory failure.  PORTABLE CHEST - 1 VIEW  Comparison: 12/25/2012  Findings: Central line tip is in the superior vena cava in good position.  There is new slight pulmonary vascular congestion.  No consolidative infiltrates or effusions.  The heart size is normal.  IMPRESSION: New pulmonary vascular congestion.   Original Report Authenticated By: Francene Boyers, M.D.    Dg Chest Port 1 View  12/25/2012  *RADIOLOGY  REPORT*  Clinical Data: Central line placement  PORTABLE CHEST - 1 VIEW  Comparison: Prior chest x-ray performed earlier today, 12/25/2012 at 01:23 a.m.  Findings: Interval placement of a right IJ central venous catheter. The tip projects over the superior  cavoatrial junction.  No evidence of complicating pneumothorax.  Inspiratory volumes are low and there is increased bibasilar atelectasis.  Stable cardiomegaly. No acute osseous abnormality.  Mild pulmonary vascular congestion.  IMPRESSION:  1.  Interval placement of a right IJ central venous catheter without evidence of complicating pneumothorax.  The tip of the catheter projects over the superior cavoatrial junction. 2.  Lower inspiratory volumes with increased bibasilar atelectasis 3.  Mild pulmonary vascular congestion   Original Report Authenticated By: Malachy Moan, M.D.    Dg Chest Port 1 View  12/25/2012  *RADIOLOGY REPORT*  Clinical Data: Hypoxia, shortness of breath.  PORTABLE CHEST - 1 VIEW  Comparison: 09/16/2011  Findings: Hypoaeration.  Heart size upper normal to mildly enlarged.  Mild bibasilar opacities.  No definite pleural effusion or pneumothorax.  No acute osseous finding.  IMPRESSION: Hypoaeration with mild bibasilar opacities; atelectasis versus early infiltrate.   Original Report Authenticated By: Jearld Lesch, M.D.     MEDICATIONS: Scheduled Meds:    . furosemide  40 mg Intravenous BID  . heparin subcutaneous  5,000 Units Subcutaneous Q8H  . insulin aspart  0-15 Units Subcutaneous TID WC  . insulin aspart  0-5 Units Subcutaneous QHS  . levothyroxine  150 mcg Oral QAC breakfast  . pantoprazole  40 mg Oral Q1200  . piperacillin-tazobactam (ZOSYN)  IV  2.25 g Intravenous Q8H   Continuous Infusions:    . sodium chloride 20 mL/hr at 12/28/12 2229   PRN Meds:.albuterol, gi cocktail, sodium chloride  Antibiotics: Anti-infectives     Start     Dose/Rate Route Frequency Ordered Stop   12/26/12 2200   vancomycin  (VANCOCIN) 2,000 mg in sodium chloride 0.9 % 500 mL IVPB  Status:  Discontinued        2,000 mg 250 mL/hr over 120 Minutes Intravenous Every 48 hours 12/26/12 1328 12/27/12 1133   12/26/12 1400   piperacillin-tazobactam (ZOSYN) IVPB 2.25 g        2.25 g 100 mL/hr over 30 Minutes Intravenous 3 times per day 12/26/12 1329     12/25/12 1200   clindamycin (CLEOCIN) IVPB 900 mg  Status:  Discontinued        900 mg 100 mL/hr over 30 Minutes Intravenous 4 times per day 12/25/12 1003 12/27/12 1133   12/25/12 0600   piperacillin-tazobactam (ZOSYN) IVPB 3.375 g  Status:  Discontinued        3.375 g 12.5 mL/hr over 240 Minutes Intravenous 3 times per day 12/25/12 0428 12/26/12 1329   12/25/12 0445   vancomycin (VANCOCIN) 1,500 mg in sodium chloride 0.9 % 500 mL IVPB        1,500 mg 250 mL/hr over 120 Minutes Intravenous  Once 12/25/12 0427 12/25/12 1317   12/25/12 0115   clindamycin (CLEOCIN) IVPB 900 mg        900 mg 100 mL/hr over 30 Minutes Intravenous  Once 12/25/12 0105 12/25/12 0305   12/25/12 0115   vancomycin (VANCOCIN) IVPB 1000 mg/200 mL premix        1,000 mg 200 mL/hr over 60 Minutes Intravenous  Once 12/25/12 0105 12/25/12 0459           Jeoffrey Massed, MD  [facility] Pager:[phone]  If 7PM-7AM, please contact night-coverage [email or web address] Password [facility] 12/29/2012, 11:43 AM   LOS: 4 days

## 2012-12-29 NOTE — Progress Notes (Signed)
[facility]  Subjective:   No major events overnight. No nausea, no vomiting. Shortness of breath stable. Not sure what this CP complaint is about, per primary team.  UOP: 1425cc/24hr on IV lasix now 40 q 12   Objective: Vital signs in last 24 hours: Blood pressure 132/72, pulse 71, temperature 98.4 F (36.9 C), temperature source Axillary, resp. rate 18, height 5' 1.81" (1.57 m), weight 178.5 kg (393 lb 8.3 oz), last menstrual period 12/03/2012, SpO2 97.00%.    PHYSICAL EXAM Gen: NAD, on BiPap  Chest: Clear breath sounds. No crackles anteriorly Cardio: regular rate and rhythm, S1, S2 normal, no murmur GI: soft, non-tender; bowel sounds normal Extremities: no pitting edema  Neurologic: No focal deficit, no asterixis Skin: erythema on left leg greatly improved   Lab Results:   Lab 12/29/12 0250 12/28/12 1730 12/28/12 0655 12/27/12 0505 12/26/12 0450  NA 136 137 136 -- --  K 4.6 4.5 4.6 -- --  CL 96 96 97 -- --  CO2 23 22 20  -- --  BUN 92* 90* 86* -- --  CREATININE 10.02* 9.90* 9.50* -- --  ALB -- -- -- -- --  GLUCOSE 92 -- -- -- --  CALCIUM 8.4 8.5 8.4 -- --  PHOS -- -- -- 9.0* 6.8*     Basename 12/29/12 0250 12/28/12 0655  WBC 7.5 5.9  HGB 11.6* 11.9*  HCT 34.7* 35.9*  PLT 166 141*     Scheduled Meds:    . furosemide  40 mg Intravenous BID  . heparin subcutaneous  5,000 Units Subcutaneous Q8H  . insulin aspart  0-15 Units Subcutaneous TID WC  . insulin aspart  0-5 Units Subcutaneous QHS  . levothyroxine  150 mcg Oral QAC breakfast  . pantoprazole  40 mg Oral Q1200  . piperacillin-tazobactam (ZOSYN)  IV  2.25 g Intravenous Q8H   Continuous Infusions:    . sodium chloride 20 mL/hr at 12/28/12 2229   PRN Meds:.  Assessment/Plan: Pt is a 32 y.o. yo female with a PMHx of morbid obesity, OSA/OHS (not on CPAP at home) who was admitted to [facility] on 12/25/2012 for evaluation of altered mental status, hypercarbia requiring BiPap and Acute renal  failure.   # Acute Renal Failure:  Creatinine from 08/2011 of 1.00. likely ATN from combination of hypoperfusion to the kidneys from poor oral intake, hypotension, ARB, in addition to rhabdomyolysis. No evidence of hydronephrosis on [place].   - Creatinine and BUN continuing to trend up. Nonoliguric on moderate dose lasix to keep volume in check given respiratory issues- no acute HD needs today.  Hopefully the rate of rise of creatinine is improving and we will see a decline soon.      # Hyponatremia:  Resolved.  # Respiratory Failure: on intermittent BiPap. New venous congestion on CXR. Per primary team.  # Elevated LFT's: likely from shock liver. Trending down. Hepatitis panel negative. Per primary team  # Cellulitis: on zosyn, improving. Blood cultures negative. Per primary team  # Acute encephalopathy: resolved.        LOS: 4 days   Jahron Hunsinger A 12/29/2012,11:27 AM

## 2012-12-30 LAB — CBC
HCT: 35 % — ABNORMAL LOW (ref 36.0–46.0)
MCHC: 33.4 g/dL (ref 30.0–36.0)
MCV: 85.2 fL (ref 78.0–100.0)
RDW: 14.5 % (ref 11.5–15.5)

## 2012-12-30 LAB — COMPREHENSIVE METABOLIC PANEL
Albumin: 2.4 g/dL — ABNORMAL LOW (ref 3.5–5.2)
BUN: 95 mg/dL — ABNORMAL HIGH (ref 6–23)
Creatinine, Ser: 10.92 mg/dL — ABNORMAL HIGH (ref 0.50–1.10)
Total Bilirubin: 0.4 mg/dL (ref 0.3–1.2)
Total Protein: 6.7 g/dL (ref 6.0–8.3)

## 2012-12-30 LAB — GLUCOSE, CAPILLARY
Glucose-Capillary: 91 mg/dL (ref 70–99)
Glucose-Capillary: 126 mg/dL — ABNORMAL HIGH (ref 70–99)
Glucose-Capillary: 83 mg/dL (ref 70–99)

## 2012-12-30 MED ORDER — DEXTROSE 5 % IV SOLN
1000.0000 mg | Freq: Two times a day (BID) | INTRAVENOUS | Status: DC
  Administered 2012-12-30 – 2013-01-03 (×9): 1000 mg via INTRAVENOUS
  Filled 2012-12-30 (×10): qty 10

## 2012-12-30 MED ORDER — CALCIUM CARBONATE ANTACID 500 MG PO CHEW
400.0000 mg | CHEWABLE_TABLET | Freq: Three times a day (TID) | ORAL | Status: DC
  Administered 2012-12-30 – 2013-01-03 (×14): 400 mg via ORAL
  Administered 2013-01-04: 200 mg via ORAL
  Administered 2013-01-04 – 2013-01-06 (×7): 400 mg via ORAL
  Filled 2012-12-30 (×24): qty 2

## 2012-12-30 NOTE — Progress Notes (Signed)
PATIENT DETAILS Name: Alexandra Beck Age: 32 y.o. Sex: female Date of Birth: 1981/03/04 Admit Date: 12/25/2012 Admitting Physician Lonia Farber, MD [name],[name], MD  Subjective: Seen and examined- developed a big blister in the left posterior aspect of the calf.  Assessment/Plan: Active Problems: Acute on chronic respiratory failure  -in setting of OSA/OHS, CPAP/BiPAP noncompliance, opioids. - Will ask case management to make sure patient has nocturnal BiPAP on discharge  Acute on chronic renal failure - Likely acute tubular necrosis- from multifactorial causes- hypotension, diuretics, ARB's and rhabdomyolysis - Creatinine increasing, per nephrology note-hopefully she will plateau soon - Diuretics per nephrology  Acute encephalopathy - Secondary to above - Has resolved  Chest Discomfort - Suspect GI etiology- now on PPI -Cardiac enzymes negative  Rhabdomyolysis - CK has normalized  Transaminitis - Likely secondary to shock liver and rhabdomyolysis - Downtrending- monitor periodically  Left lower extremity cellulitis - Erythema has come down-but slightly increase today -seen by ortho today-recommended wound care - Dopplers of the left lower extremity done on 1/28-negative for DVT - CT scan of the left leg than on 1/28-showed cellulitis without myofascitis or osteomyelitis or abscess - Continue with Zosyn  Diabetes - Continue with SSI - Metformin currently contraindicated with renal failure - A1c 6.2  Hypothyroid - Continue with levothyroxine  Hypertension - Hold metoprolol-currently on Lasix  Dyslipidemia - Hold statins secondary to resolve her abdomen and significantly elevated transaminases  Morbid obesity with BMI of 70 and over, adult - Counseled extensively regarding the importance of weight loss  Disposition: Remain inpatient  DVT Prophylaxis: Start Prophylactic  Heparin  Code Status: Full  code  Procedures:  None  CONSULTS:  pulmonary/intensive care and nephrology  PHYSICAL EXAM: Vital signs in last 24 hours: Filed Vitals:   12/29/12 2051 12/29/12 2344 12/30/12 0513 12/30/12 0902  BP: 151/83  145/85   Pulse: 70 72 70   Temp: 98.6 F (37 C)  98.7 F (37.1 C)   TempSrc: Oral  Oral   Resp: 20 20 18    Height:      Weight:    175.633 kg (387 lb 3.2 oz)  SpO2: 96%  100%     Weight change:  Body mass index is 71.25 kg/(m^2).   Gen Exam: Awake and alert with clear speech.   Neck: Supple, No JVD.   Chest: B/L Clear.  But decreased air entry given body habitus CVS: S1 S2 Regular, no murmurs.  Abdomen: soft, BS +, non tender, non distended.  Extremities: no edema, lower extremities warm to touch. Erythema-in the lower mid calf area of the left lower-large  blister in the post aspect of the calf. extremity. Nontender left lower extremity, sensation is intact Neurologic: Non Focal.   Skin: No Rash.   Wounds: N/A.   Intake/Output from previous day:  Intake/Output Summary (Last 24 hours) at 12/30/12 1058 Last data filed at 12/30/12 0600  Gross per 24 hour  Intake 1300.33 ml  Output   3350 ml  Net -2049.67 ml     LAB RESULTS: CBC  Lab 12/30/12 0616 12/29/12 0250 12/28/12 0655 12/27/12 0505 12/26/12 0450 12/25/12 0126  WBC 7.6 7.5 5.9 5.5 8.1 --  HGB 11.7* 11.6* 11.9* 10.8* 10.8* --  HCT 35.0* 34.7* 35.9* 33.1* 32.9* --  PLT 181 166 141* 125* 119* --  MCV 85.2 85.7 86.5 88.3 88.4 --  MCH 28.5 28.6 28.7 28.8 29.0 --  MCHC 33.4 33.4 33.1 32.6 32.8 --  RDW 14.5 14.5 14.4 14.5 14.5 --  LYMPHSABS -- -- -- -- -- 1.7  MONOABS -- -- -- -- -- 0.6  EOSABS -- -- -- -- -- 0.0  BASOSABS -- -- -- -- -- 0.0  BANDABS -- -- -- -- -- --    Chemistries   Lab 12/30/12 0616 12/29/12 1648 12/29/12 0250 12/28/12 1730 12/28/12 0655 12/27/12 0505 12/26/12 0450  NA 136 137 136 137 136 -- --  K 4.4 4.5 4.6 4.5 4.6 -- --  CL 96 97 96 96 97 -- --  CO2 18* 23 23 22 20  -- --   GLUCOSE 90 106* 92 125* 86 -- --  BUN 95* 93* 92* 90* 86* -- --  CREATININE 10.92* 10.48* 10.02* 9.90* 9.50* -- --  CALCIUM 8.7 8.6 8.4 8.5 8.4 -- --  MG -- -- -- -- -- 2.5 1.9    CBG:  Lab 12/30/12 0735 12/29/12 2054 12/29/12 1745 12/29/12 1206 12/29/12 0744  GLUCAP 91 128* 101* 126* 93    GFR Estimated Creatinine Clearance: 11.7 ml/min (by C-G formula based on Cr of 10.92).  Coagulation profile  Lab 12/28/12 0655 12/25/12 0620  INR 1.33 1.70*  PROTIME -- --    Cardiac Enzymes  Lab 12/29/12 1300 12/29/12 0250 12/28/12 1959  CKMB -- -- --  TROPONINI <0.30 <0.30 <0.30  MYOGLOBIN -- -- --    No components found with this basename: POCBNP:3 No results found for this basename: DDIMER:2 in the last 72 hours No results found for this basename: HGBA1C:2 in the last 72 hours No results found for this basename: CHOL:2,HDL:2,LDLCALC:2,TRIG:2,CHOLHDL:2,LDLDIRECT:2 in the last 72 hours No results found for this basename: TSH,T4TOTAL,FREET3,T3FREE,THYROIDAB in the last 72 hours No results found for this basename: VITAMINB12:2,FOLATE:2,FERRITIN:2,TIBC:2,IRON:2,RETICCTPCT:2 in the last 72 hours No results found for this basename: LIPASE:2,AMYLASE:2 in the last 72 hours  Urine Studies No results found for this basename: UACOL:2,UAPR:2,USPG:2,UPH:2,UTP:2,UGL:2,UKET:2,UBIL:2,UHGB:2,UNIT:2,UROB:2,ULEU:2,UEPI:2,UWBC:2,URBC:2,UBAC:2,CAST:2,CRYS:2,UCOM:2,BILUA:2 in the last 72 hours  MICROBIOLOGY: Recent Results (from the past 240 hour(s))  CULTURE, BLOOD (ROUTINE X 2)     Status: Normal (Preliminary result)   Collection Time   12/25/12  1:10 AM      Component Value Range Status Comment   Specimen Description BLOOD RIGHT ARM   Final    Special Requests BOTTLES DRAWN AEROBIC AND ANAEROBIC 10CC EA   Final    Culture  Setup Time 12/25/2012 04:54   Final    Culture     Final    Value:        BLOOD CULTURE RECEIVED NO GROWTH TO DATE CULTURE WILL BE HELD FOR 5 DAYS BEFORE ISSUING A FINAL  NEGATIVE REPORT   Report Status PENDING   Incomplete   CULTURE, BLOOD (ROUTINE X 2)     Status: Normal (Preliminary result)   Collection Time   12/25/12  1:25 AM      Component Value Range Status Comment   Specimen Description BLOOD RIGHT HAND   Final    Special Requests BOTTLES DRAWN AEROBIC ONLY 10CC   Final    Culture  Setup Time 12/25/2012 04:54   Final    Culture     Final    Value:        BLOOD CULTURE RECEIVED NO GROWTH TO DATE CULTURE WILL BE HELD FOR 5 DAYS BEFORE ISSUING A FINAL NEGATIVE REPORT   Report Status PENDING   Incomplete   MRSA PCR SCREENING     Status: Normal   Collection Time   12/25/12  5:48 AM      Component Value Range  Status Comment   MRSA by PCR NEGATIVE  NEGATIVE Final   URINE CULTURE     Status: Normal   Collection Time   12/25/12  6:16 AM      Component Value Range Status Comment   Specimen Description URINE, CATHETERIZED   Final    Special Requests NONE   Final    Culture  Setup Time 12/25/2012 06:52   Final    Colony Count NO GROWTH   Final    Culture NO GROWTH   Final    Report Status 12/26/2012 FINAL   Final     RADIOLOGY STUDIES/RESULTS: Ct Femur Left Wo Contrast  12/25/2012  *RADIOLOGY REPORT*  Clinical Data: Pain and swelling.  CT OF THE LEFT FEMUR WITHOUT CONTRAST,CT TIBIA FIBULA LEFT WITHOUT CONTRAST  Comparison: None.  Findings: Mild diffuse cellulitis involving the left lower extremity most notably in the mid distal tibia / fibula region.  No discrete soft tissue abscess or findings for myofasciitis.  The bony structures are intact.  No findings to suggest septic arthritis or osteomyelitis.  Slightly enlarged / inflamed left inguinal lymph nodes are noted.  IMPRESSION: Cellulitis without findings for focal soft tissue abscess, myofasciitis, septic arthritis or osteomyelitis.   Original Report Authenticated By: Rudie Meyer, M.D.    Ct Tibia Fibula Left Wo Contrast  12/25/2012  *RADIOLOGY REPORT*  Clinical Data: Pain and swelling.  CT OF THE LEFT  FEMUR WITHOUT CONTRAST,CT TIBIA FIBULA LEFT WITHOUT CONTRAST  Comparison: None.  Findings: Mild diffuse cellulitis involving the left lower extremity most notably in the mid distal tibia / fibula region.  No discrete soft tissue abscess or findings for myofasciitis.  The bony structures are intact.  No findings to suggest septic arthritis or osteomyelitis.  Slightly enlarged / inflamed left inguinal lymph nodes are noted.  IMPRESSION: Cellulitis without findings for focal soft tissue abscess, myofasciitis, septic arthritis or osteomyelitis.   Original Report Authenticated By: Rudie Meyer, M.D.    US Renal Port  12/25/2012  *RADIOLOGY REPORT*  Clinical Data: Worsening renal function.  RENAL/URINARY TRACT ULTRASOUND COMPLETE  Comparison:  MRI 12/28/2005  Findings:  Right Kidney:  12.0 cm. Normal size and echotexture.  No focal abnormality.  No hydronephrosis.  Left Kidney:  12.8 cm. Normal size and echotexture.  No focal abnormality.  No hydronephrosis.  Bladder:  Not visualized.  IMPRESSION: No acute renal abnormality.  No hydronephrosis.  Nonvisualization of the bladder.   Original Report Authenticated By: Charlett Nose, M.D.    Dg Chest Port 1 View  12/27/2012  *RADIOLOGY REPORT*  Clinical Data: Respiratory failure.  Shortness of breath.  PORTABLE CHEST - 1 VIEW  Comparison: Chest 12/26/2012 and 12/25/2012.  PA and lateral chest 09/16/2011.  Findings: Right IJ catheter remains in place.  There is cardiomegaly and mild vascular congestion.  No consolidative process, pneumothorax or effusion.  IMPRESSION: Cardiomegaly and mild vascular congestion.   Original Report Authenticated By: Holley Dexter, M.D.    Dg Chest Port 1 View  12/26/2012  *RADIOLOGY REPORT*  Clinical Data: Shortness of breath.  Respiratory failure.  PORTABLE CHEST - 1 VIEW  Comparison: 12/25/2012  Findings: Central line tip is in the superior vena cava in good position.  There is new slight pulmonary vascular congestion.  No consolidative  infiltrates or effusions.  The heart size is normal.  IMPRESSION: New pulmonary vascular congestion.   Original Report Authenticated By: Francene Boyers, M.D.    Dg Chest Port 1 View  12/25/2012  *RADIOLOGY REPORT*  Clinical Data: Central line placement  PORTABLE CHEST - 1 VIEW  Comparison: Prior chest x-ray performed earlier today, 12/25/2012 at 01:23 a.m.  Findings: Interval placement of a right IJ central venous catheter. The tip projects over the superior cavoatrial junction.  No evidence of complicating pneumothorax.  Inspiratory volumes are low and there is increased bibasilar atelectasis.  Stable cardiomegaly. No acute osseous abnormality.  Mild pulmonary vascular congestion.  IMPRESSION:  1.  Interval placement of a right IJ central venous catheter without evidence of complicating pneumothorax.  The tip of the catheter projects over the superior cavoatrial junction. 2.  Lower inspiratory volumes with increased bibasilar atelectasis 3.  Mild pulmonary vascular congestion   Original Report Authenticated By: Malachy Moan, M.D.    Dg Chest Port 1 View  12/25/2012  *RADIOLOGY REPORT*  Clinical Data: Hypoxia, shortness of breath.  PORTABLE CHEST - 1 VIEW  Comparison: 09/16/2011  Findings: Hypoaeration.  Heart size upper normal to mildly enlarged.  Mild bibasilar opacities.  No definite pleural effusion or pneumothorax.  No acute osseous finding.  IMPRESSION: Hypoaeration with mild bibasilar opacities; atelectasis versus early infiltrate.   Original Report Authenticated By: Jearld Lesch, M.D.     MEDICATIONS: Scheduled Meds:    . calcium carbonate  400 mg of elemental calcium Oral TID WC  . furosemide  40 mg Intravenous BID  . heparin subcutaneous  5,000 Units Subcutaneous Q8H  . insulin aspart  0-15 Units Subcutaneous TID WC  . insulin aspart  0-5 Units Subcutaneous QHS  . levothyroxine  150 mcg Oral QAC breakfast  . pantoprazole  40 mg Oral Q1200  . piperacillin-tazobactam (ZOSYN)  IV   2.25 g Intravenous Q8H   Continuous Infusions:    . sodium chloride 20 mL/hr at 12/28/12 2229   PRN Meds:.albuterol, gi cocktail, sodium chloride  Antibiotics: Anti-infectives     Start     Dose/Rate Route Frequency Ordered Stop   12/26/12 2200   vancomycin (VANCOCIN) 2,000 mg in sodium chloride 0.9 % 500 mL IVPB  Status:  Discontinued        2,000 mg 250 mL/hr over 120 Minutes Intravenous Every 48 hours 12/26/12 1328 12/27/12 1133   12/26/12 1400   piperacillin-tazobactam (ZOSYN) IVPB 2.25 g        2.25 g 100 mL/hr over 30 Minutes Intravenous 3 times per day 12/26/12 1329     12/25/12 1200   clindamycin (CLEOCIN) IVPB 900 mg  Status:  Discontinued        900 mg 100 mL/hr over 30 Minutes Intravenous 4 times per day 12/25/12 1003 12/27/12 1133   12/25/12 0600   piperacillin-tazobactam (ZOSYN) IVPB 3.375 g  Status:  Discontinued        3.375 g 12.5 mL/hr over 240 Minutes Intravenous 3 times per day 12/25/12 0428 12/26/12 1329   12/25/12 0445   vancomycin (VANCOCIN) 1,500 mg in sodium chloride 0.9 % 500 mL IVPB        1,500 mg 250 mL/hr over 120 Minutes Intravenous  Once 12/25/12 0427 12/25/12 1317   12/25/12 0115   clindamycin (CLEOCIN) IVPB 900 mg        900 mg 100 mL/hr over 30 Minutes Intravenous  Once 12/25/12 0105 12/25/12 0305   12/25/12 0115   vancomycin (VANCOCIN) IVPB 1000 mg/200 mL premix        1,000 mg 200 mL/hr over 60 Minutes Intravenous  Once 12/25/12 0105 12/25/12 0459           Jeoffrey Massed,  MD  [facility] Pager:[phone]  If 7PM-7AM, please contact night-coverage [email or web address] Password [facility] 12/30/2012, 10:58 AM   LOS: 5 days

## 2012-12-30 NOTE — Progress Notes (Signed)
SPORTS MEDICINE AND JOINT REPLACEMENT  Georgena Spurling, MD   Altamese Cabal, PA-C [address] [place], [place], [place]  [address]                             [phone]   PROGRESS NOTE  Subjective:  negative for Chest Pain  negative for Shortness of Breath  negative for Nausea/Vomiting   negative for Calf Pain  negative for Bowel Movement   Tolerating Diet: yes         Patient reports pain as 2 on 0-10 scale.    Objective: Vital signs in last 24 hours:   Patient Vitals for the past 24 hrs:  BP Temp Temp src Pulse Resp SpO2 Weight  12/30/12 0902 - - - - - - 175.633 kg (387 lb 3.2 oz)  12/30/12 0513 145/85 mmHg 98.7 F (37.1 C) Oral 70  18  100 % -  12/29/12 2344 - - - 72  20  - -  12/29/12 2051 151/83 mmHg 98.6 F (37 C) Oral 70  20  96 % -    @flow {1959:LAST@   Intake/Output from previous day:   02/01 0701 - 02/02 0700 In: 1540.3 [P.O.:720; I.V.:670.3] Out: 3350 [Urine:3350]   Intake/Output this shift:       Intake/Output      02/01 0701 - 02/02 0700 02/02 0701 - 02/03 0700   P.O. 720    I.V. (mL/kg) 670.3 (3.8)    IV Piggyback 150    Total Intake(mL/kg) 1540.3 (8.6)    Urine (mL/kg/hr) 3350 (0.8)    Total Output 3350    Net -1809.7         Stool Occurrence 1 x       LABORATORY DATA:  Basename 12/30/12 9147 12/29/12 0250 12/28/12 0655 12/27/12 0505 12/26/12 0450 12/25/12 0126 12/24/12 0024  WBC 7.6 7.5 5.9 5.5 8.1 10.3 --  HGB 11.7* 11.6* 11.9* 10.8* 10.8* 12.7 14.3  HCT 35.0* 34.7* 35.9* 33.1* 32.9* 39.4 42.0  PLT 181 166 141* 125* 119* 152 --    Basename 12/30/12 0616 12/29/12 1648 12/29/12 0250 12/28/12 1730 12/28/12 0655 12/27/12 1050 12/27/12 0505  NA 136 137 136 137 136 141 139  K 4.4 4.5 4.6 4.5 4.6 5.0 5.0  CL 96 97 96 96 97 101 98  CO2 18* 23 23 22 20 21 21   BUN 95* 93* 92* 90* 86* 79* 75*  CREATININE 10.92* 10.48* 10.02* 9.90* 9.50* 8.33* 8.17*  GLUCOSE 90 106* 92 125* 86 82 82  CALCIUM 8.7 8.6 8.4 8.5 8.4 8.0* 8.0*   Lab Results   Component Value Date   INR 1.33 12/28/2012   INR 1.70* 12/25/2012   INR 0.95 10/18/2009    Examination:  Extremities: The left leg cellulits has created a blister (similar to a fracture blister) which has ruptured but does not appear to be infected.  Wound Exam: draining   Drainage:  Scant/small amount Serosanguinous exudate  Motor Exam: WNL  Sensory Exam: WNL normal   Assessment:        Cellulitis in both legs with a new blister on the left  ADDITIONAL DIAGNOSIS:  Active Problems:  Morbid obesity with BMI of 70 and over, adult  Acute-on-chronic respiratory failure  Acute-on-chronic renal failure  Acute encephalopathy  Cellulitis  Hyponatremia  Hypovolemia  OSA (obstructive sleep apnea)  Obesity hypoventilation syndrome     Plan: New dressing order:  Xeroform sheet and kerlix to  left leg blister qday.           Deshawn Witty,STEPHEN D 12/30/2012, 9:32 AM

## 2012-12-30 NOTE — Progress Notes (Signed)
Patient LLE is becoming more erythematous than yesterday.  Now there is a blister on the L calf measuring 15 cm x 12cm with a portion of the skin coming off accompanied by a small amount of purulent drainage.  Patient's mother Krystyna Cleckley) would like a call after morning rounds at [phone].

## 2012-12-30 NOTE — Progress Notes (Signed)
[facility]  Subjective:   No major events overnight. No nausea, no vomiting. Shortness of breath stable. CP issues better. Has now developed blister on posterior leg UOP: 3300cc/24hr on IV lasix now 40 q 12   Objective: Vital signs in last 24 hours: Blood pressure 145/85, pulse 70, temperature 98.7 F (37.1 C), temperature source Oral, resp. rate 18, height 5' 1.81" (1.57 m), weight 175.633 kg (387 lb 3.2 oz), last menstrual period 12/03/2012, SpO2 100.00%.    PHYSICAL EXAM Gen: NAD, alert Chest: Clear breath sounds. No crackles anteriorly Cardio: regular rate and rhythm, S1, S2 normal, no murmur GI: soft, non-tender; bowel sounds normal Extremities: no pitting edema  Neurologic: No focal deficit, no asterixis Skin: erythema on left leg greatly improved anteriorly, has blister posteriorly, wound care ordered   Lab Results:   Lab 12/30/12 0616 12/29/12 1648 12/29/12 0250 12/27/12 0505 12/26/12 0450  NA 136 137 136 -- --  K 4.4 4.5 4.6 -- --  CL 96 97 96 -- --  CO2 18* 23 23 -- --  BUN 95* 93* 92* -- --  CREATININE 10.92* 10.48* 10.02* -- --  ALB -- -- -- -- --  GLUCOSE 90 -- -- -- --  CALCIUM 8.7 8.6 8.4 -- --  PHOS -- -- -- 9.0* 6.8*     Basename 12/30/12 0616 12/29/12 0250  WBC 7.6 7.5  HGB 11.7* 11.6*  HCT 35.0* 34.7*  PLT 181 166     Scheduled Meds:    . furosemide  40 mg Intravenous BID  . heparin subcutaneous  5,000 Units Subcutaneous Q8H  . insulin aspart  0-15 Units Subcutaneous TID WC  . insulin aspart  0-5 Units Subcutaneous QHS  . levothyroxine  150 mcg Oral QAC breakfast  . pantoprazole  40 mg Oral Q1200  . piperacillin-tazobactam (ZOSYN)  IV  2.25 g Intravenous Q8H   Continuous Infusions:    . sodium chloride 20 mL/hr at 12/28/12 2229   PRN Meds:.  Assessment/Plan: Pt is a 32 y.o. yo female with a PMHx of morbid obesity, OSA/OHS (not on CPAP at home) who was admitted to [facility] on 12/25/2012 for evaluation of altered mental  status, hypercarbia requiring BiPap and Acute renal failure.   # Acute Renal Failure:  Creatinine from 08/2011 of 1.00. likely ATN from combination of hypoperfusion to the kidneys from poor oral intake/ hypotension/ARB, in addition to rhabdomyolysis. No evidence of hydronephrosis on [place].   - Creatinine and BUN continuing to trend up. Nonoliguric on moderate dose lasix to keep volume in check given respiratory issues- no acute HD needs today.  Hopefully the rate of rise of creatinine is improving and we will see a decline soon.      # Hyponatremia:  Resolved.  # Respiratory Failure: on intermittent BiPap. New venous congestion on CXR. Per primary team.  # Elevated LFT's: likely from shock liver. Trending down. Hepatitis panel negative. Per primary team  # Cellulitis: on zosyn, improving. Blood cultures negative. Per primary team  # Acute encephalopathy: resolved.  #hyperphosphatemia- will start TUMS for phosphate binding       LOS: 5 days   Jedadiah Abdallah A 12/30/2012,10:04 AM

## 2012-12-30 NOTE — Progress Notes (Signed)
ANTIBIOTIC CONSULT NOTE-PROGRESS NOTE  Pharmacy Consult for Zosyn, Cefazolin Indication: Cellulitis  Hospital Problems Active Problems:  Morbid obesity with BMI of 70 and over, adult  Acute-on-chronic respiratory failure  Acute-on-chronic renal failure  Acute encephalopathy  Cellulitis  Hyponatremia  Hypovolemia  OSA (obstructive sleep apnea)  Obesity hypoventilation syndrome   Allergies Allergies  Allergen Reactions  . Aspirin     REACTION: throat swelling, hives  . Doxycycline Other (See Comments)    Abdominal pain  . Niaspan (Niacin Er)     Caused flushing    Patient Measurements: Height: 5' 1.81" (157 cm) Weight: 387 lb 3.2 oz (175.633 kg) IBW/kg (Calculated) : 49.67   Vital Signs: BP 145/85  Pulse 70  Temp 98.7 F (37.1 C) (Oral)  Resp 18  Ht 5' 1.81" (1.57 m)  Wt 387 lb 3.2 oz (175.633 kg)  BMI 71.25 kg/m2  SpO2 100%  LMP 12/03/2012  Labs:  Basename 12/30/12 0616 12/29/12 1648 12/29/12 0250 12/28/12 0655  WBC 7.6 -- 7.5 5.9  HGB 11.7* -- 11.6* 11.9*  PLT 181 -- 166 141*  CREATININE 10.92* 10.48* 10.02* --   Estimated Creatinine Clearance: 11.7 ml/min (by C-G formula based on Cr of 10.92).   Microbiology: Recent Results (from the past 720 hour(s))  CULTURE, BLOOD (ROUTINE X 2)     Status: Normal (Preliminary result)   Collection Time   12/25/12  1:10 AM      Component Value Range Status Comment   Specimen Description BLOOD RIGHT ARM   Final    Special Requests BOTTLES DRAWN AEROBIC AND ANAEROBIC 10CC EA   Final    Culture  Setup Time 12/25/2012 04:54   Final    Culture     Final    Value:        BLOOD CULTURE RECEIVED NO GROWTH TO DATE CULTURE WILL BE HELD FOR 5 DAYS BEFORE ISSUING A FINAL NEGATIVE REPORT   Report Status PENDING   Incomplete   CULTURE, BLOOD (ROUTINE X 2)     Status: Normal (Preliminary result)   Collection Time   12/25/12  1:25 AM      Component Value Range Status Comment   Specimen Description BLOOD RIGHT HAND   Final    Special Requests BOTTLES DRAWN AEROBIC ONLY 10CC   Final    Culture  Setup Time 12/25/2012 04:54   Final    Culture     Final    Value:        BLOOD CULTURE RECEIVED NO GROWTH TO DATE CULTURE WILL BE HELD FOR 5 DAYS BEFORE ISSUING A FINAL NEGATIVE REPORT   Report Status PENDING   Incomplete   MRSA PCR SCREENING     Status: Normal   Collection Time   12/25/12  5:48 AM      Component Value Range Status Comment   MRSA by PCR NEGATIVE  NEGATIVE Final   URINE CULTURE     Status: Normal   Collection Time   12/25/12  6:16 AM      Component Value Range Status Comment   Specimen Description URINE, CATHETERIZED   Final    Special Requests NONE   Final    Culture  Setup Time 12/25/2012 06:52   Final    Colony Count NO GROWTH   Final    Culture NO GROWTH   Final    Report Status 12/26/2012 FINAL   Final     Anti-infectives Anti-infectives     Start     Dose/Rate  Route Frequency Ordered Stop   12/30/12 1230   ceFAZolin (ANCEF) 1,000 mg in dextrose 5 % 50 mL IVPB        1,000 mg 120 mL/hr over 30 Minutes Intravenous Every 12 hours 12/30/12 1221     12/26/12 1400   piperacillin-tazobactam (ZOSYN) IVPB 2.25 g        2.25 g 100 mL/hr over 30 Minutes Intravenous 3 times per day 12/26/12 1329            Assessment:  32 y/o female with Cellulitis LE on Day # 6 antibiotics, Day # 5 Zosyn now adding Cefazolin for Gm+ coverage.  Renal function remains ~ 12 ml/min.    Will begin Cefazolin, renally adjusted.  Goal of Therapy:   Zosyn and Cefazolin dose adjusted for renal function.  Plan:   Continue Zosyn 2.25 gm IV q 8 hours  Begin Cefazolin 1 gm IV q 12 hours.  Follow cultures/sensitivities.  Laurena Bering, Pharm.D.  12/30/2012 12:21 PM

## 2012-12-31 DIAGNOSIS — I10 Essential (primary) hypertension: Secondary | ICD-10-CM

## 2012-12-31 LAB — CULTURE, BLOOD (ROUTINE X 2): Culture: NO GROWTH

## 2012-12-31 LAB — COMPREHENSIVE METABOLIC PANEL
Albumin: 2.5 g/dL — ABNORMAL LOW (ref 3.5–5.2)
Alkaline Phosphatase: 55 U/L (ref 39–117)
BUN: 97 mg/dL — ABNORMAL HIGH (ref 6–23)
Calcium: 8.9 mg/dL (ref 8.4–10.5)
Potassium: 3.8 mEq/L (ref 3.5–5.1)
Total Protein: 6.8 g/dL (ref 6.0–8.3)

## 2012-12-31 LAB — CBC
HCT: 34.1 % — ABNORMAL LOW (ref 36.0–46.0)
MCH: 29.1 pg (ref 26.0–34.0)
MCHC: 34.6 g/dL (ref 30.0–36.0)
RDW: 14.5 % (ref 11.5–15.5)

## 2012-12-31 LAB — GLUCOSE, CAPILLARY
Glucose-Capillary: 100 mg/dL — ABNORMAL HIGH (ref 70–99)
Glucose-Capillary: 98 mg/dL (ref 70–99)

## 2012-12-31 MED ORDER — ACETAMINOPHEN 500 MG PO TABS
500.0000 mg | ORAL_TABLET | Freq: Once | ORAL | Status: AC
  Administered 2012-12-31: 500 mg via ORAL
  Filled 2012-12-31: qty 1

## 2012-12-31 MED ORDER — MORPHINE SULFATE 2 MG/ML IJ SOLN
2.0000 mg | Freq: Once | INTRAMUSCULAR | Status: AC
  Administered 2012-12-31: 2 mg via INTRAVENOUS
  Filled 2012-12-31: qty 1

## 2012-12-31 NOTE — Progress Notes (Signed)
PATIENT DETAILS Name: Alexandra Beck Age: 32 y.o. Sex: female Date of Birth: [birth year] Admit Date: 12/25/2012 Admitting Physician Lonia Farber, MD ZOX:[id number] J, MD  Subjective: No complaints.  Asking why her creatinine keeps rising.  Assessment/Plan: Active Problems: Acute on chronic respiratory failure  -in setting of OSA/OHS, CPAP/BiPAP noncompliance, opioids. - Will ask case management to make sure patient has nocturnal BiPAP on discharge  Acute on chronic renal failure - Likely acute tubular necrosis- from multifactorial causes- hypotension, diuretics, ARB's and rhabdomyolysis - Creatinine increasing, per nephrology note-hopefully she will plateau soon - Urine output is good, no azotemia, no back pain.  - Diuretics per nephrology - Zosyn discontinued. Due to possible nephrotoxic effects.  Acute encephalopathy - Secondary to above - Has resolved  Chest Discomfort - Suspect GI etiology- now on PPI - Cardiac enzymes negative - no further complaints of chest discomfort  Rhabdomyolysis - CK has normalized  Transaminitis - Likely secondary to shock liver and rhabdomyolysis - Normalized now with the exception of an elevated ALT.  Still trending down well.  Left lower extremity cellulitis - Patient reports erythema began the day of admission, and brought her to the hospital. - Erythema has come down when compared to the traced areas, but she has developed significant blistering (2/1) covering her left calf. - seen by ortho who recommended wound care - Dopplers of the left lower extremity done on 1/28-negative for DVT - CT scan of the left leg than on 1/28-showed cellulitis without myofascitis or osteomyelitis or abscess - Continue with Ancef  Diabetes - Continue with SSI.. Cbgs 80 - 130 - Metformin currently contraindicated with renal failure - A1c 6.2  Hypothyroid - Continue with levothyroxine  Hypertension - Hold metoprolol-currently on  Lasix -diastolic slightly elevated.  Dyslipidemia - Hold statins due to significantly elevated transaminases  Morbid obesity with BMI of 70 and over, adult - Counseled extensively regarding the importance of weight loss  Disposition: Remain inpatient  DVT Prophylaxis: Start Prophylactic  Heparin  Code Status: Full code  Procedures:  None  CONSULTS: Pulmonary/intensive care Nephrology Orthopedics Wound Care  PHYSICAL EXAM: Vital signs in last 24 hours: Filed Vitals:   12/31/12 0506 12/31/12 0840 12/31/12 0855 12/31/12 1327  BP: 143/97   133/90  Pulse: 109   66  Temp: 97.8 F (36.6 C)   97.8 F (36.6 C)  TempSrc: Oral   Oral  Resp: 18   20  Height:      Weight: 174.1 kg (383 lb 13.1 oz)     SpO2: 93% 87% 92% 97%    Weight change:  Body mass index is 70.63 kg/(m^2).   Gen Exam: Awake and alert, On Bipap, appears comfortable, pleasant Neck: Supple, No JVD.   Chest: B/L Clear.  But decreased air entry given body habitus CVS: S1 S2 Regular, no murmurs.  Abdomen: massive, soft, BS +, non tender, non distended.  Skin: lower extremities warm to touch. Erythema-in the left lower mid calf area. large  blister in the post aspect of the calf. Nontender left lower extremity, sensation is intact.  Mild erythema in the distal right lower extremity. Neurologic: Non Focal.     Intake/Output from previous day:  Intake/Output Summary (Last 24 hours) at 12/31/12 1345 Last data filed at 12/31/12 1328  Gross per 24 hour  Intake 1062.33 ml  Output   3400 ml  Net -2337.67 ml     LAB RESULTS: CBC  Lab 12/31/12 0700 12/30/12 0616 12/29/12 0250 12/28/12 0655 12/27/12 0505  12/25/12 0126  WBC 8.2 7.6 7.5 5.9 5.5 --  HGB 11.8* 11.7* 11.6* 11.9* 10.8* --  HCT 34.1* 35.0* 34.7* 35.9* 33.1* --  PLT 197 181 166 141* 125* --  MCV 84.0 85.2 85.7 86.5 88.3 --  MCH 29.1 28.5 28.6 28.7 28.8 --  MCHC 34.6 33.4 33.4 33.1 32.6 --  RDW 14.5 14.5 14.5 14.4 14.5 --  LYMPHSABS -- --  -- -- -- 1.7  MONOABS -- -- -- -- -- 0.6  EOSABS -- -- -- -- -- 0.0  BASOSABS -- -- -- -- -- 0.0  BANDABS -- -- -- -- -- --    Chemistries   Lab 12/31/12 0700 12/30/12 0616 12/29/12 1648 12/29/12 0250 12/28/12 1730 12/27/12 0505 12/26/12 0450  NA 137 136 137 136 137 -- --  K 3.8 4.4 4.5 4.6 4.5 -- --  CL 96 96 97 96 96 -- --  CO2 20 18* 23 23 22  -- --  GLUCOSE 116* 90 106* 92 125* -- --  BUN 97* 95* 93* 92* 90* -- --  CREATININE 11.27* 10.92* 10.48* 10.02* 9.90* -- --  CALCIUM 8.9 8.7 8.6 8.4 8.5 -- --  MG -- -- -- -- -- 2.5 1.9    CBG:  Lab 12/31/12 1207 12/31/12 0738 12/30/12 2106 12/30/12 1709 12/30/12 1157  GLUCAP 108* 100* 83 87 126*    GFR Estimated Creatinine Clearance: 11.3 ml/min (by C-G formula based on Cr of 11.27).  Coagulation profile  Lab 12/28/12 0655 12/25/12 0620  INR 1.33 1.70*  PROTIME -- --    Cardiac Enzymes  Lab 12/29/12 1300 12/29/12 0250 12/28/12 1959  CKMB -- -- --  TROPONINI <0.30 <0.30 <0.30  MYOGLOBIN -- -- --    MICROBIOLOGY: Recent Results (from the past 240 hour(s))  CULTURE, BLOOD (ROUTINE X 2)     Status: Normal   Collection Time   12/25/12  1:10 AM      Component Value Range Status Comment   Specimen Description BLOOD RIGHT ARM   Final    Special Requests BOTTLES DRAWN AEROBIC AND ANAEROBIC 10CC EA   Final    Culture  Setup Time 12/25/2012 04:54   Final    Culture NO GROWTH 5 DAYS   Final    Report Status 12/31/2012 FINAL   Final   CULTURE, BLOOD (ROUTINE X 2)     Status: Normal   Collection Time   12/25/12  1:25 AM      Component Value Range Status Comment   Specimen Description BLOOD RIGHT HAND   Final    Special Requests BOTTLES DRAWN AEROBIC ONLY 10CC   Final    Culture  Setup Time 12/25/2012 04:54   Final    Culture NO GROWTH 5 DAYS   Final    Report Status 12/31/2012 FINAL   Final   MRSA PCR SCREENING     Status: Normal   Collection Time   12/25/12  5:48 AM      Component Value Range Status Comment   MRSA by PCR  NEGATIVE  NEGATIVE Final   URINE CULTURE     Status: Normal   Collection Time   12/25/12  6:16 AM      Component Value Range Status Comment   Specimen Description URINE, CATHETERIZED   Final    Special Requests NONE   Final    Culture  Setup Time 12/25/2012 06:52   Final    Colony Count NO GROWTH   Final    Culture NO  GROWTH   Final    Report Status 12/26/2012 FINAL   Final     RADIOLOGY STUDIES/RESULTS: Ct Femur Left Wo Contrast  12/25/2012  *RADIOLOGY REPORT*  Clinical Data: Pain and swelling.  CT OF THE LEFT FEMUR WITHOUT CONTRAST,CT TIBIA FIBULA LEFT WITHOUT CONTRAST  Comparison: None.  Findings: Mild diffuse cellulitis involving the left lower extremity most notably in the mid distal tibia / fibula region.  No discrete soft tissue abscess or findings for myofasciitis.  The bony structures are intact.  No findings to suggest septic arthritis or osteomyelitis.  Slightly enlarged / inflamed left inguinal lymph nodes are noted.  IMPRESSION: Cellulitis without findings for focal soft tissue abscess, myofasciitis, septic arthritis or osteomyelitis.   Original Report Authenticated By: Rudie Meyer, M.D.    Ct Tibia Fibula Left Wo Contrast  12/25/2012  *RADIOLOGY REPORT*  Clinical Data: Pain and swelling.  CT OF THE LEFT FEMUR WITHOUT CONTRAST,CT TIBIA FIBULA LEFT WITHOUT CONTRAST  Comparison: None.  Findings: Mild diffuse cellulitis involving the left lower extremity most notably in the mid distal tibia / fibula region.  No discrete soft tissue abscess or findings for myofasciitis.  The bony structures are intact.  No findings to suggest septic arthritis or osteomyelitis.  Slightly enlarged / inflamed left inguinal lymph nodes are noted.  IMPRESSION: Cellulitis without findings for focal soft tissue abscess, myofasciitis, septic arthritis or osteomyelitis.   Original Report Authenticated By: Rudie Meyer, M.D.    US Renal Port  12/25/2012  *RADIOLOGY REPORT*  Clinical Data: Worsening renal  function.  RENAL/URINARY TRACT ULTRASOUND COMPLETE  Comparison:  MRI 12/28/2005  Findings:  Right Kidney:  12.0 cm. Normal size and echotexture.  No focal abnormality.  No hydronephrosis.  Left Kidney:  12.8 cm. Normal size and echotexture.  No focal abnormality.  No hydronephrosis.  Bladder:  Not visualized.  IMPRESSION: No acute renal abnormality.  No hydronephrosis.  Nonvisualization of the bladder.   Original Report Authenticated By: Charlett Nose, M.D.    Dg Chest Port 1 View  12/27/2012  *RADIOLOGY REPORT*  Clinical Data: Respiratory failure.  Shortness of breath.  PORTABLE CHEST - 1 VIEW  Comparison: Chest 12/26/2012 and 12/25/2012.  PA and lateral chest 09/16/2011.  Findings: Right IJ catheter remains in place.  There is cardiomegaly and mild vascular congestion.  No consolidative process, pneumothorax or effusion.  IMPRESSION: Cardiomegaly and mild vascular congestion.   Original Report Authenticated By: Holley Dexter, M.D.    Dg Chest Port 1 View  12/26/2012  *RADIOLOGY REPORT*  Clinical Data: Shortness of breath.  Respiratory failure.  PORTABLE CHEST - 1 VIEW  Comparison: 12/25/2012  Findings: Central line tip is in the superior vena cava in good position.  There is new slight pulmonary vascular congestion.  No consolidative infiltrates or effusions.  The heart size is normal.  IMPRESSION: New pulmonary vascular congestion.   Original Report Authenticated By: Francene Boyers, M.D.    Dg Chest Port 1 View  12/25/2012  *RADIOLOGY REPORT*  Clinical Data: Central line placement  PORTABLE CHEST - 1 VIEW  Comparison: Prior chest x-ray performed earlier today, 12/25/2012 at 01:23 a.m.  Findings: Interval placement of a right IJ central venous catheter. The tip projects over the superior cavoatrial junction.  No evidence of complicating pneumothorax.  Inspiratory volumes are low and there is increased bibasilar atelectasis.  Stable cardiomegaly. No acute osseous abnormality.  Mild pulmonary vascular  congestion.  IMPRESSION:  1.  Interval placement of a right IJ central venous catheter without  evidence of complicating pneumothorax.  The tip of the catheter projects over the superior cavoatrial junction. 2.  Lower inspiratory volumes with increased bibasilar atelectasis 3.  Mild pulmonary vascular congestion   Original Report Authenticated By: Malachy Moan, M.D.    Dg Chest Port 1 View  12/25/2012  *RADIOLOGY REPORT*  Clinical Data: Hypoxia, shortness of breath.  PORTABLE CHEST - 1 VIEW  Comparison: 09/16/2011  Findings: Hypoaeration.  Heart size upper normal to mildly enlarged.  Mild bibasilar opacities.  No definite pleural effusion or pneumothorax.  No acute osseous finding.  IMPRESSION: Hypoaeration with mild bibasilar opacities; atelectasis versus early infiltrate.   Original Report Authenticated By: Jearld Lesch, M.D.     MEDICATIONS: Scheduled Meds:    . calcium carbonate  400 mg of elemental calcium Oral TID WC  . ceFAZolin (ANCEF) IVPB - custom  1,000 mg Intravenous Q12H  . furosemide  40 mg Intravenous BID  . heparin subcutaneous  5,000 Units Subcutaneous Q8H  . insulin aspart  0-15 Units Subcutaneous TID WC  . insulin aspart  0-5 Units Subcutaneous QHS  . levothyroxine  150 mcg Oral QAC breakfast  . pantoprazole  40 mg Oral Q1200   Continuous Infusions:    . sodium chloride 20 mL/hr at 12/28/12 2229   PRN Meds:.albuterol, gi cocktail, sodium chloride  Antibiotics: Anti-infectives     Start     Dose/Rate Route Frequency Ordered Stop   12/30/12 1230   ceFAZolin (ANCEF) 1,000 mg in dextrose 5 % 50 mL IVPB        1,000 mg 120 mL/hr over 30 Minutes Intravenous Every 12 hours 12/30/12 1221     12/26/12 2200   vancomycin (VANCOCIN) 2,000 mg in sodium chloride 0.9 % 500 mL IVPB  Status:  Discontinued        2,000 mg 250 mL/hr over 120 Minutes Intravenous Every 48 hours 12/26/12 1328 12/27/12 1133   12/26/12 1400   piperacillin-tazobactam (ZOSYN) IVPB 2.25 g  Status:   Discontinued        2.25 g 100 mL/hr over 30 Minutes Intravenous 3 times per day 12/26/12 1329 12/31/12 1322   12/25/12 1200   clindamycin (CLEOCIN) IVPB 900 mg  Status:  Discontinued        900 mg 100 mL/hr over 30 Minutes Intravenous 4 times per day 12/25/12 1003 12/27/12 1133   12/25/12 0600   piperacillin-tazobactam (ZOSYN) IVPB 3.375 g  Status:  Discontinued        3.375 g 12.5 mL/hr over 240 Minutes Intravenous 3 times per day 12/25/12 0428 12/26/12 1329   12/25/12 0445   vancomycin (VANCOCIN) 1,500 mg in sodium chloride 0.9 % 500 mL IVPB        1,500 mg 250 mL/hr over 120 Minutes Intravenous  Once 12/25/12 0427 12/25/12 1317   12/25/12 0115   clindamycin (CLEOCIN) IVPB 900 mg        900 mg 100 mL/hr over 30 Minutes Intravenous  Once 12/25/12 0105 12/25/12 0305   12/25/12 0115   vancomycin (VANCOCIN) IVPB 1000 mg/200 mL premix        1,000 mg 200 mL/hr over 60 Minutes Intravenous  Once 12/25/12 0105 12/25/12 0459           Conley Canal [facility] Pager:[phone]  If 7PM-7AM, please contact night-coverage [email or web address] Password [email or web address] 12/31/2012, 1:45 PM   LOS: 6 days    Attending -I have seen and examined the patient, Agree with assessment and plan. Appreciate ortho  follow up yesterday-blister appears unchanged. Great urine output, we are awaiting renal function to plateau.  S Fabrice Dyal

## 2012-12-31 NOTE — Progress Notes (Signed)
[facility]  Subjective: 3.6 liters UOP yesterday Only on modest doses of furosemide  Objective: Vital signs in last 24 hours: Blood pressure 143/97, pulse 109, temperature 97.8 F (36.6 C), temperature source Oral, resp. rate 18, height 5' 1.81" (1.57 m), weight 174.1 kg (383 lb 13.1 oz), last menstrual period 12/03/2012, SpO2 92.00%.  12/31/12 0506 ! 174.1 kg (383 lb 13.1 oz)  12/30/12 0902 ! 175.633 kg (387 lb 3.2 oz)   12/29/12 0323 ! 178.5 kg (393 lb 8.3 oz)   12/28/12 0449 ! 187.4 kg (413 lb 2.3 oz)  12/27/12 0500 ! 189 kg (416 lb 10.7 oz)  PHYSICAL EXAM Gen: NAD, alert Chest: Clear breath sounds. No crackles anteriorly Cardio: regular rate and rhythm, S1, S2 normal, no murmur GI: soft, non-tender; bowel sounds normal Extremities: no pitting edema  Neurologic: No focal deficit, no asterixis Skin:huge hemorrhagic bulla involving almost the entire posterior calf of the left leg Cellulitic change of right leg is better and redness both sides is receeding from the marked lines No asterixus  Lab Results:   Lab 12/31/12 0700 12/30/12 0616 12/29/12 1648 12/27/12 0505 12/26/12 0450  NA 137 136 137 -- --  K 3.8 4.4 4.5 -- --  CL 96 96 97 -- --  CO2 20 18* 23 -- --  BUN 97* 95* 93* -- --  CREATININE 11.27* 10.92* 10.48* -- --  ALB -- -- -- -- --  GLUCOSE 116* -- -- -- --  CALCIUM 8.9 8.7 8.6 -- --  PHOS -- -- -- 9.0* 6.8*     Basename 12/31/12 0700 12/30/12 0616  WBC 8.2 7.6  HGB 11.8* 11.7*  HCT 34.1* 35.0*  PLT 197 181     Scheduled Meds:    . calcium carbonate  400 mg of elemental calcium Oral TID WC  . ceFAZolin (ANCEF) IVPB - custom  1,000 mg Intravenous Q12H  . furosemide  40 mg Intravenous BID  . heparin subcutaneous  5,000 Units Subcutaneous Q8H  . insulin aspart  0-15 Units Subcutaneous TID WC  . insulin aspart  0-5 Units Subcutaneous QHS  . levothyroxine  150 mcg Oral QAC breakfast  . pantoprazole  40 mg Oral Q1200  .  piperacillin-tazobactam (ZOSYN)  IV  2.25 g Intravenous Q8H   Continuous Infusions:    . sodium chloride 20 mL/hr at 12/28/12 2229   PRN Meds:.  Assessment/Plan:  Pt is a 32 y.o. yo female with a PMHx of morbid obesity, OSA/OHS (not on CPAP at home) who was admitted to [facility] on 12/25/2012 for evaluation of altered mental status, hypercarbia requiring BiPap and acute renal failure.   # Acute Renal Failure:  Creatinine from 08/2011 of 1.00.  likely ATN from combination of hypoperfusion to the kidneys from poor oral intake/ hypotension/ARB, in addition to rhabdomyolysis (peak CK only ion the 2000's) No evidence of hydronephrosis on [place].   Creatinine and BUN continuing to trend up but rate of rise appears to be slowing down Nonoliguric on modest dose lasix to keep volume in check given respiratory issues- no acute HD needs today.   Hopefully the rate of rise of creatinine is improving and we will see a decline soon.      # Hyponatremia:  Resolved.  # Respiratory Failure: on intermittent BiPap.  Vascular congestion CXR 1/29 Sats drop when takes oxygen off   # Elevated LFT's: likely from shock liver. Trending down. Hepatitis panel negative. Per primary team   # Cellulitis: on zosyn and Ancef,  improving.  Blood cultures negative. Per primary team Large hemorrhagic bullae on left calf - wound care to see   # Acute encephalopathy: resolved.   #hyperphosphatemia- will start TUMS for phosphate binding       LOS: 6 days   Alexandra Beck B 12/31/2012,1:17 PM

## 2012-12-31 NOTE — Progress Notes (Signed)
Occupational Therapy Treatment Patient Details Name: Alexandra Beck MRN: [id number] DOB: 1981/04/13 Today's Date: 12/31/2012 Time: 7829-5621 OT Time Calculation (min): 29 min  OT Assessment / Plan / Recommendation Comments on Treatment Session This 32 yo female admitted with altered mental status, hypercarbia requiring BiPap and Acute renal failure presents to acute OT making progress, will continue to benefit from acute OT wtih follow up HHOT    Follow Up Recommendations  Home health OT       Equipment Recommendations   (None)       Frequency Min 2X/week   Plan Discharge plan remains appropriate    Precautions / Restrictions Precautions Precautions: Fall Precaution Comments: Sats dropped on RA with ambulation, intially put her back on 2 liters and then dropped this to 1 liter Restrictions Weight Bearing Restrictions: No       ADL  Toilet Transfer: Min Pension scheme manager Method: Sit to Barista: Regular height toilet;Grab bars Transfers/Ambulation Related to ADLs: Min guard A for all ADL Comments: Pt states she normally stands and holds onto something while she raises one leg up and dons sock one handed, recommended that it would be safer to sit to this and that AE may be of benefit to her (will try next session). Also talked about how insurance does not cover  tub seats and that she may want to just do a wash up until she feel strong enough to stand and to a shower or we could look at getting her a seat     OT Goals ADL Goals ADL Goal: Toilet Transfer - Progress: Progressing toward goals Miscellaneous OT Goals OT Goal: Miscellaneous Goal #1 - Progress: Met OT Goal: Miscellaneous Goal #2 - Progress: Progressing toward goals  Visit Information  Last OT Received On: 12/31/12 Assistance Needed: +2 (for lines and safety) PT/OT Co-Evaluation/Treatment: Yes          Cognition  Overall Cognitive Status: Appears within functional limits for tasks  assessed/performed Arousal/Alertness: Awake/alert Orientation Level: Appears intact for tasks assessed Behavior During Session: [facility] for tasks performed    Mobility   Bed Mobility Bed Mobility: Supine to Sit;Sitting - Scoot to Edge of Bed Supine to Sit: 6: Modified independent (Device/Increase time);With rails;HOB flat Sitting - Scoot to Edge of Bed: 6: Modified independent (Device/Increase time);With rail Transfers Transfers: Sit to Stand;Stand to Sit Sit to Stand: 4: Min guard;With upper extremity assist;From bed Stand to Sit: 4: Min guard;With upper extremity assist;With armrests;To chair/3-in-1 Details for Transfer Assistance: VCs for safe hand placement             End of Session OT - End of Session Equipment Utilized During Treatment:  (Bari RW, O2 at 1 liter) Activity Tolerance: Patient tolerated treatment well Patient left: in chair;with call bell/phone within reach       Evette Georges [phone] 12/31/2012, 9:56 AM

## 2012-12-31 NOTE — Progress Notes (Signed)
INITIAL NUTRITION ASSESSMENT  DOCUMENTATION CODES Per approved criteria  -Morbid Obesity   INTERVENTION: 1. Snacks per pt preferences  2. RD will continue to follow    NUTRITION DIAGNOSIS: Inadequate oral intake related to poor appetite as evidenced by 20% meal completion.   Goal: PO intake to meet 90-100% estimated nutrition needs  Monitor:  PO intake, weight trends, I/O's, labs  Reason for Assessment: Consult   32 y.o. female  Admitting Dx: Acute Respiratory Failure  ASSESSMENT: Pt was admitted with acute resp failure and required Bipap. Pt with acute renal failure and now being followed by Nephrology, making urin and no need for HD at this time.   RD spoke with pt, states she is not eating well. Does not like the foods and not hungry. RD to add snacks per pt preference.   Admission weight of 386 lbs, trended up to 416 lbs, now trending back down to 383 lbs. Pt is eating ~20% of meals.   Height: Ht Readings from Last 1 Encounters:  12/25/12 5' 1.81" (1.57 m)    Weight: Wt Readings from Last 1 Encounters:  12/31/12 383 lb 13.1 oz (174.1 kg)    Ideal Body Weight: 110 lbs  % Ideal Body Weight: 348%  Wt Readings from Last 10 Encounters:  12/31/12 383 lb 13.1 oz (174.1 kg)  12/23/12 386 lb (175.088 kg)  04/17/12 367 lb 1.6 oz (166.515 kg)  07/13/07 281 lb 14.4 oz (127.869 kg)  06/29/07 283 lb 0.6 oz (128.385 kg)  06/26/07 287 lb 3.2 oz (130.273 kg)    Usual Body Weight: unclear, weight has been trending up  % Usual Body Weight: --  BMI:  Body mass index is 70.63 kg/(m^2). Obesity class 3, extreme  Estimated Nutritional Needs: Kcal: 2300-2500 (daily to promote weight loss) Protein: 85-95 gm  Fluid: >/=1.2 L/day  Skin: Cellulitis to BLE  Diet Order: Carb Control  EDUCATION NEEDS: -No education needs identified at this time   Intake/Output Summary (Last 24 hours) at 12/31/12 1528 Last data filed at 12/31/12 1328  Gross per 24 hour  Intake  1062.33 ml  Output   3400 ml  Net -2337.67 ml    Last BM: 2/3   Labs:   Lab 12/31/12 0700 12/30/12 0616 12/29/12 1648 12/27/12 0505 12/26/12 0450  NA 137 136 137 -- --  K 3.8 4.4 4.5 -- --  CL 96 96 97 -- --  CO2 20 18* 23 -- --  BUN 97* 95* 93* -- --  CREATININE 11.27* 10.92* 10.48* -- --  CALCIUM 8.9 8.7 8.6 -- --  MG -- -- -- 2.5 1.9  PHOS -- -- -- 9.0* 6.8*  GLUCOSE 116* 90 106* -- --    CBG (last 3)   Basename 12/31/12 1207 12/31/12 0738 12/30/12 2106  GLUCAP 108* 100* 83   Lab Results  Component Value Date   HGBA1C 6.2* 12/25/2012    Scheduled Meds:   . calcium carbonate  400 mg of elemental calcium Oral TID WC  . ceFAZolin (ANCEF) IVPB - custom  1,000 mg Intravenous Q12H  . furosemide  40 mg Intravenous BID  . heparin subcutaneous  5,000 Units Subcutaneous Q8H  . insulin aspart  0-15 Units Subcutaneous TID WC  . insulin aspart  0-5 Units Subcutaneous QHS  . levothyroxine  150 mcg Oral QAC breakfast  . pantoprazole  40 mg Oral Q1200    Continuous Infusions:   . sodium chloride 20 mL/hr at 12/28/12 2229    Past Medical  History  Diagnosis Date  . Hypertension   . Apnea   . CHF (congestive heart failure)   . Morbid obesity   . Hyperlipidemia     Per patient  . Prediabetes     Per patient  . Sleep apnea     History reviewed. No pertinent past surgical history.  Clarene Duke RD, LDN Pager [phone] After Hours pager [phone]

## 2012-12-31 NOTE — Progress Notes (Signed)
Physical Therapy Treatment Patient Details Name: Alexandra Beck MRN: [id number] DOB: 08-09-1981 Today's Date: 12/31/2012 Time: 0825-0900 PT Time Calculation (min): 35 min  PT Assessment / Plan / Recommendation Comments on Treatment Session  SaO2 dropped to 87% on RA walking, applied 1L O2 which raised sats to 92-93% with walking. Pt ambulated 100' with bariatric RW.    Follow Up Recommendations  Home health PT;Supervision - Intermittent     Does the patient have the potential to tolerate intense rehabilitation     Barriers to Discharge        Equipment Recommendations  Rolling walker with 5" wheels (bariatric)    Recommendations for Other Services    Frequency Min 3X/week   Plan Frequency remains appropriate;Discharge plan remains appropriate    Precautions / Restrictions Precautions Precautions: Fall Precaution Comments: Sats dropped on RA with ambulation, intially put her back on 2 liters and then dropped this to 1 liter Restrictions Weight Bearing Restrictions: No   Pertinent Vitals/Pain **SaO2 87% RA walking, 92-93% on 1L Pt denies pain*    Mobility  Bed Mobility Bed Mobility: Supine to Sit;Sitting - Scoot to Edge of Bed Supine to Sit: 6: Modified independent (Device/Increase time);With rails;HOB flat Sitting - Scoot to Edge of Bed: 6: Modified independent (Device/Increase time);With rail Transfers Sit to Stand: 4: Min guard;With upper extremity assist;From bed Stand to Sit: 4: Min guard;With upper extremity assist;With armrests;To chair/3-in-1 Details for Transfer Assistance: VCs for safe hand placement Ambulation/Gait Ambulation/Gait Assistance: 4: Min guard Ambulation Distance (Feet): 100 Feet Assistive device: Rolling walker (bariatric RW) Ambulation/Gait Assistance Details: min/guard for safety/balance Gait Pattern: Step-through pattern General Gait Details: SaO2 dropped to 87% on RA walking, applied 1L O2 with sats then in low 90's    Exercises Total Joint  Exercises Long Arc Quad: AROM;Left;10 reps;Seated   PT Diagnosis:    PT Problem List:   PT Treatment Interventions:     PT Goals Acute Rehab PT Goals PT Goal Formulation: With patient Time For Goal Achievement: 01/10/13 Potential to Achieve Goals: Good Pt will go Supine/Side to Sit: Independently PT Goal: Supine/Side to Sit - Progress: Progressing toward goal Pt will go Sit to Stand: Independently PT Goal: Sit to Stand - Progress: Progressing toward goal Pt will Transfer Bed to Chair/Chair to Bed: Independently PT Transfer Goal: Bed to Chair/Chair to Bed - Progress: Progressing toward goal Pt will Ambulate: 51 - 150 feet;with modified independence;with least restrictive assistive device PT Goal: Ambulate - Progress: Progressing toward goal Pt will Go Up / Down Stairs: 3-5 stairs;with supervision;with least restrictive assistive device  Visit Information  Last PT Received On: 12/31/12 Assistance Needed: +2 PT/OT Co-Evaluation/Treatment: Yes    Subjective Data  Subjective: "How often can I walk? 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

## 2013-01-01 LAB — COMPREHENSIVE METABOLIC PANEL
ALT: 140 U/L — ABNORMAL HIGH (ref 0–35)
AST: 26 U/L (ref 0–37)
Albumin: 2.6 g/dL — ABNORMAL LOW (ref 3.5–5.2)
Alkaline Phosphatase: 57 U/L (ref 39–117)
CO2: 22 mEq/L (ref 19–32)
Chloride: 95 mEq/L — ABNORMAL LOW (ref 96–112)
Potassium: 4 mEq/L (ref 3.5–5.1)
Sodium: 137 mEq/L (ref 135–145)
Total Bilirubin: 0.3 mg/dL (ref 0.3–1.2)

## 2013-01-01 LAB — GLUCOSE, CAPILLARY
Glucose-Capillary: 95 mg/dL (ref 70–99)
Glucose-Capillary: 127 mg/dL — ABNORMAL HIGH (ref 70–99)

## 2013-01-01 LAB — CBC
Platelets: 219 10*3/uL (ref 150–400)
RBC: 4.13 MIL/uL (ref 3.87–5.11)
RDW: 14.4 % (ref 11.5–15.5)
WBC: 8.2 10*3/uL (ref 4.0–10.5)

## 2013-01-01 LAB — CK: Total CK: 25 U/L (ref 7–177)

## 2013-01-01 MED ORDER — ACETAMINOPHEN 325 MG PO TABS
650.0000 mg | ORAL_TABLET | Freq: Once | ORAL | Status: DC
  Filled 2013-01-01: qty 2

## 2013-01-01 NOTE — Consult Note (Signed)
WOC consult Note Reason for Consult: Left leg with previous cellulitis which evolved into a bulla to posterior leg.  At this time bulla has ruptured and evolved into 16X10cm dry scabbed partial thickness wound. Wound bed: Dark red 80% and dry yellow 20%.  Wound edges are pink and dry. Drainage (amount, consistency, odor) No odor or drainage. Periwound: Dry peeling skin to wound edges.  Previously marked cellulitis areas are pink and receeding in size. Dressing procedure/placement/frequency: Moist gauze to assist with removal of nonviable tissue.  This area will need to be lightly scrubbed with each dressing change to soften and remove dry scabbed areas to wound bed.  If pt is allowed to shower, then area should be uncovered to allow water to soften wound, then aggressively scrubbed with towel before re-applying moist gauze. Pt could benefit from home health assistance for dressing changes if discharged home. Will not plan to follow further unless re-consulted.  [address] RN, MSN, [employer]  [phone]

## 2013-01-01 NOTE — Progress Notes (Signed)
Pt ambulated 100 ft on 1 L of O2. Used front wheel walker and supervision. O2 saturation between 97% and 98% on 1 L. Tolerated well with minimal SOB.   Peter Congo RN

## 2013-01-01 NOTE — Progress Notes (Signed)
PATIENT DETAILS Name: Alexandra Beck Age: 32 y.o. Sex: female Date of Birth: [birth year] Admit Date: 12/25/2012 Admitting Physician Lonia Farber, MD ZOX:[id number],VWUJW J, MD  Subjective: Depressed about being in the hospital.  Would really like to go home.  No other complaints.  Assessment/Plan:  Acute on chronic respiratory failure  -in setting of OSA/OHS, CPAP/BiPAP noncompliance, opioids. - Will ask case management to make sure patient has nocturnal BiPAP on discharge - Patient doing well with nocturnal bi-pap during hospital stay.  Acute on chronic renal failure - Likely acute tubular necrosis- from multifactorial causes- hypotension, diuretics, ARB's and rhabdomyolysis - Creatinine appears to finally have leveled out. Need to ensure downward trend before patient is able to be discharged safely. - Urine output is good, no azotemia, no back pain.  - Diuretics per nephrology  Left lower extremity cellulitis - Patient reports erythema began the day of admission, and brought her to the hospital. - Erythema has come down when compared to the traced areas, but she has developed significant blistering/bulla (2/1) covering her left calf. - seen by ortho who recommended wound care -Wound care RN has consulted.  We appreciate recommendations.  Will order [facility] RN for dressing changes. - Dopplers of the left lower extremity done on 1/28-negative for DVT - CT scan of the left leg than on 1/28-showed cellulitis without myofascitis or osteomyelitis or abscess - Continue with Ancef  Acute encephalopathy -Resolved. -Secondary to Acute respiratory failure, acute renal failure  Chest Discomfort - Suspect GI etiology- now on PPI - Cardiac enzymes negative - no further complaints of chest discomfort  Rhabdomyolysis - CK has normalized  Transaminitis - Likely secondary to shock liver and rhabdomyolysis - Almost entirely normalized now with the exception of an elevated ALT.  Still  trending down well.  Diabetes - Continue with SSI.. Cbgs 80 - 130 - Metformin currently contraindicated with renal failure - A1c 6.2  Hypothyroid - Continue with levothyroxine  Hypertension - Hold metoprolol, HYZAAR -Moderate control on lasix.  Will need to determine outpatient regimen.  Dyslipidemia - Hold statins currently due to elevated LFTs.  Will add back soon.  Morbid obesity with BMI of 70 and over, adult - Counseled extensively regarding the importance of weight loss - Discussed possible out patient consultation with a bariatric surgeon   Disposition: Remain inpatient  DVT Prophylaxis: Start Prophylactic  Heparin  Code Status: Full code  Procedures:  None  CONSULTS: Pulmonary/intensive care Nephrology Orthopedics Wound Care  PHYSICAL EXAM: Vital signs in last 24 hours: Filed Vitals:   12/31/12 1327 12/31/12 2018 01/01/13 0516 01/01/13 0611  BP: 133/90 149/93 156/92   Pulse: 66 63 71   Temp: 97.8 F (36.6 C) 98.1 F (36.7 C) 98.2 F (36.8 C)   TempSrc: Oral Oral Axillary   Resp: 20 20 20    Height:      Weight:    174.7 kg (385 lb 2.3 oz)  SpO2: 97% 97% 98%     Weight change: -0.933 kg (-2 lb 0.9 oz) Body mass index is 70.88 kg/(m^2).   Gen Exam: Awake and alert, appears sad but still pleasant Neck: Supple, No JVD.   Chest: CTA no w/c/r, no accessory muscle use. CVS: RRR, no m/r/g.  Abdomen: massive, soft, BS +, non tender, non distended.  Skin: lower extremities warm to touch. Erythema-in the left lower mid calf area. large scabbed wound (16 x 10 cm)  in the post aspect of the calf. Nontender left lower extremity, sensation is intact.  Mild erythema in the distal right lower extremity. Neurologic: Non Focal.     Intake/Output from previous day:  Intake/Output Summary (Last 24 hours) at 01/01/13 1007 Last data filed at 01/01/13 0900  Gross per 24 hour  Intake   1361 ml  Output   1850 ml  Net   -489 ml     LAB RESULTS: CBC  Lab  01/01/13 0650 12/31/12 0700 12/30/12 0616 12/29/12 0250 12/28/12 0655  WBC 8.2 8.2 7.6 7.5 5.9  HGB 12.0 11.8* 11.7* 11.6* 11.9*  HCT 34.8* 34.1* 35.0* 34.7* 35.9*  PLT 219 197 181 166 141*  MCV 84.3 84.0 85.2 85.7 86.5  MCH 29.1 29.1 28.5 28.6 28.7  MCHC 34.5 34.6 33.4 33.4 33.1  RDW 14.4 14.5 14.5 14.5 14.4  LYMPHSABS -- -- -- -- --  MONOABS -- -- -- -- --  EOSABS -- -- -- -- --  BASOSABS -- -- -- -- --  BANDABS -- -- -- -- --    Chemistries   Lab 01/01/13 0650 12/31/12 0700 12/30/12 0616 12/29/12 1648 12/29/12 0250 12/27/12 0505 12/26/12 0450  NA 137 137 136 137 136 -- --  K 4.0 3.8 4.4 4.5 4.6 -- --  CL 95* 96 96 97 96 -- --  CO2 22 20 18* 23 23 -- --  GLUCOSE 92 116* 90 106* 92 -- --  BUN 96* 97* 95* 93* 92* -- --  CREATININE 11.04* 11.27* 10.92* 10.48* 10.02* -- --  CALCIUM 9.3 8.9 8.7 8.6 8.4 -- --  MG -- -- -- -- -- 2.5 1.9    CBG:  Lab 01/01/13 0805 12/31/12 2155 12/31/12 1627 12/31/12 1207 12/31/12 0738  GLUCAP 95 107* 98 108* 100*    GFR Estimated Creatinine Clearance: 11.5 ml/min (by C-G formula based on Cr of 11.04).  Coagulation profile  Lab 12/28/12 0655  INR 1.33  PROTIME --    Cardiac Enzymes  Lab 12/29/12 1300 12/29/12 0250 12/28/12 1959  CKMB -- -- --  TROPONINI <0.30 <0.30 <0.30  MYOGLOBIN -- -- --    MICROBIOLOGY: Recent Results (from the past 240 hour(s))  CULTURE, BLOOD (ROUTINE X 2)     Status: Normal   Collection Time   12/25/12  1:10 AM      Component Value Range Status Comment   Specimen Description BLOOD RIGHT ARM   Final    Special Requests BOTTLES DRAWN AEROBIC AND ANAEROBIC 10CC EA   Final    Culture  Setup Time 12/25/2012 04:54   Final    Culture NO GROWTH 5 DAYS   Final    Report Status 12/31/2012 FINAL   Final   CULTURE, BLOOD (ROUTINE X 2)     Status: Normal   Collection Time   12/25/12  1:25 AM      Component Value Range Status Comment   Specimen Description BLOOD RIGHT HAND   Final    Special Requests BOTTLES  DRAWN AEROBIC ONLY 10CC   Final    Culture  Setup Time 12/25/2012 04:54   Final    Culture NO GROWTH 5 DAYS   Final    Report Status 12/31/2012 FINAL   Final   MRSA PCR SCREENING     Status: Normal   Collection Time   12/25/12  5:48 AM      Component Value Range Status Comment   MRSA by PCR NEGATIVE  NEGATIVE Final   URINE CULTURE     Status: Normal   Collection Time   12/25/12  6:16 AM      Component Value Range Status Comment   Specimen Description URINE, CATHETERIZED   Final    Special Requests NONE   Final    Culture  Setup Time 12/25/2012 06:52   Final    Colony Count NO GROWTH   Final    Culture NO GROWTH   Final    Report Status 12/26/2012 FINAL   Final     RADIOLOGY STUDIES/RESULTS: Ct Femur Left Wo Contrast  12/25/2012  *RADIOLOGY REPORT*  Clinical Data: Pain and swelling.  CT OF THE LEFT FEMUR WITHOUT CONTRAST,CT TIBIA FIBULA LEFT WITHOUT CONTRAST  Comparison: None.  Findings: Mild diffuse cellulitis involving the left lower extremity most notably in the mid distal tibia / fibula region.  No discrete soft tissue abscess or findings for myofasciitis.  The bony structures are intact.  No findings to suggest septic arthritis or osteomyelitis.  Slightly enlarged / inflamed left inguinal lymph nodes are noted.  IMPRESSION: Cellulitis without findings for focal soft tissue abscess, myofasciitis, septic arthritis or osteomyelitis.   Original Report Authenticated By: Rudie Meyer, M.D.    Ct Tibia Fibula Left Wo Contrast  12/25/2012  *RADIOLOGY REPORT*  Clinical Data: Pain and swelling.  CT OF THE LEFT FEMUR WITHOUT CONTRAST,CT TIBIA FIBULA LEFT WITHOUT CONTRAST  Comparison: None.  Findings: Mild diffuse cellulitis involving the left lower extremity most notably in the mid distal tibia / fibula region.  No discrete soft tissue abscess or findings for myofasciitis.  The bony structures are intact.  No findings to suggest septic arthritis or osteomyelitis.  Slightly enlarged / inflamed left  inguinal lymph nodes are noted.  IMPRESSION: Cellulitis without findings for focal soft tissue abscess, myofasciitis, septic arthritis or osteomyelitis.   Original Report Authenticated By: Rudie Meyer, M.D.    US Renal Port  12/25/2012  *RADIOLOGY REPORT*  Clinical Data: Worsening renal function.  RENAL/URINARY TRACT ULTRASOUND COMPLETE  Comparison:  MRI 12/28/2005  Findings:  Right Kidney:  12.0 cm. Normal size and echotexture.  No focal abnormality.  No hydronephrosis.  Left Kidney:  12.8 cm. Normal size and echotexture.  No focal abnormality.  No hydronephrosis.  Bladder:  Not visualized.  IMPRESSION: No acute renal abnormality.  No hydronephrosis.  Nonvisualization of the bladder.   Original Report Authenticated By: Charlett Nose, M.D.    Dg Chest Port 1 View  12/27/2012  *RADIOLOGY REPORT*  Clinical Data: Respiratory failure.  Shortness of breath.  PORTABLE CHEST - 1 VIEW  Comparison: Chest 12/26/2012 and 12/25/2012.  PA and lateral chest 09/16/2011.  Findings: Right IJ catheter remains in place.  There is cardiomegaly and mild vascular congestion.  No consolidative process, pneumothorax or effusion.  IMPRESSION: Cardiomegaly and mild vascular congestion.   Original Report Authenticated By: Holley Dexter, M.D.    Dg Chest Port 1 View  12/26/2012  *RADIOLOGY REPORT*  Clinical Data: Shortness of breath.  Respiratory failure.  PORTABLE CHEST - 1 VIEW  Comparison: 12/25/2012  Findings: Central line tip is in the superior vena cava in good position.  There is new slight pulmonary vascular congestion.  No consolidative infiltrates or effusions.  The heart size is normal.  IMPRESSION: New pulmonary vascular congestion.   Original Report Authenticated By: Francene Boyers, M.D.    Dg Chest Port 1 View  12/25/2012  *RADIOLOGY REPORT*  Clinical Data: Central line placement  PORTABLE CHEST - 1 VIEW  Comparison: Prior chest x-ray performed earlier today, 12/25/2012 at 01:23 a.m.  Findings: Interval placement of  a  right IJ central venous catheter. The tip projects over the superior cavoatrial junction.  No evidence of complicating pneumothorax.  Inspiratory volumes are low and there is increased bibasilar atelectasis.  Stable cardiomegaly. No acute osseous abnormality.  Mild pulmonary vascular congestion.  IMPRESSION:  1.  Interval placement of a right IJ central venous catheter without evidence of complicating pneumothorax.  The tip of the catheter projects over the superior cavoatrial junction. 2.  Lower inspiratory volumes with increased bibasilar atelectasis 3.  Mild pulmonary vascular congestion   Original Report Authenticated By: Malachy Moan, M.D.    Dg Chest Port 1 View  12/25/2012  *RADIOLOGY REPORT*  Clinical Data: Hypoxia, shortness of breath.  PORTABLE CHEST - 1 VIEW  Comparison: 09/16/2011  Findings: Hypoaeration.  Heart size upper normal to mildly enlarged.  Mild bibasilar opacities.  No definite pleural effusion or pneumothorax.  No acute osseous finding.  IMPRESSION: Hypoaeration with mild bibasilar opacities; atelectasis versus early infiltrate.   Original Report Authenticated By: Jearld Lesch, M.D.     MEDICATIONS: Scheduled Meds:    . calcium carbonate  400 mg of elemental calcium Oral TID WC  . ceFAZolin (ANCEF) IVPB - custom  1,000 mg Intravenous Q12H  . furosemide  40 mg Intravenous BID  . heparin subcutaneous  5,000 Units Subcutaneous Q8H  . insulin aspart  0-15 Units Subcutaneous TID WC  . insulin aspart  0-5 Units Subcutaneous QHS  . levothyroxine  150 mcg Oral QAC breakfast  . pantoprazole  40 mg Oral Q1200   Continuous Infusions:    . sodium chloride 20 mL/hr at 12/28/12 2229   PRN Meds:.albuterol, gi cocktail, sodium chloride  Antibiotics: Anti-infectives     Start     Dose/Rate Route Frequency Ordered Stop   12/30/12 1230   ceFAZolin (ANCEF) 1,000 mg in dextrose 5 % 50 mL IVPB        1,000 mg 120 mL/hr over 30 Minutes Intravenous Every 12 hours 12/30/12  1221     12/26/12 2200   vancomycin (VANCOCIN) 2,000 mg in sodium chloride 0.9 % 500 mL IVPB  Status:  Discontinued        2,000 mg 250 mL/hr over 120 Minutes Intravenous Every 48 hours 12/26/12 1328 12/27/12 1133   12/26/12 1400   piperacillin-tazobactam (ZOSYN) IVPB 2.25 g  Status:  Discontinued        2.25 g 100 mL/hr over 30 Minutes Intravenous 3 times per day 12/26/12 1329 12/31/12 1322   12/25/12 1200   clindamycin (CLEOCIN) IVPB 900 mg  Status:  Discontinued        900 mg 100 mL/hr over 30 Minutes Intravenous 4 times per day 12/25/12 1003 12/27/12 1133   12/25/12 0600   piperacillin-tazobactam (ZOSYN) IVPB 3.375 g  Status:  Discontinued        3.375 g 12.5 mL/hr over 240 Minutes Intravenous 3 times per day 12/25/12 0428 12/26/12 1329   12/25/12 0445   vancomycin (VANCOCIN) 1,500 mg in sodium chloride 0.9 % 500 mL IVPB        1,500 mg 250 mL/hr over 120 Minutes Intravenous  Once 12/25/12 0427 12/25/12 1317   12/25/12 0115   clindamycin (CLEOCIN) IVPB 900 mg        900 mg 100 mL/hr over 30 Minutes Intravenous  Once 12/25/12 0105 12/25/12 0305   12/25/12 0115   vancomycin (VANCOCIN) IVPB 1000 mg/200 mL premix        1,000 mg 200 mL/hr over 60 Minutes Intravenous  Once  12/25/12 0105 12/25/12 0459           Conley Canal [facility] Pager:[phone]  If 7PM-7AM, please contact night-coverage [email or web address] Password [email or web address] 01/01/2013, 10:07 AM   LOS: 7 days   Attending Patient was seen and examined, agree with the above assessment and plan. Patient is anxious to be discharged home, however fortunately her kidney function has now plateaued. She has good urine output. Will need to ensure that her creatinine is moving in the right direction before considering discharge. Her lower extremity cellulitis seems stable with a blister, appreciate orthopedic evaluation today. Continue with empiric Ancef therapy. Case worker will arrange for BiPAP on discharge  S  Ghimire

## 2013-01-01 NOTE — Progress Notes (Signed)
     Subjective:  She is tired of being in the hospital, and is worried about her kidneys. She has been able to get up and walk in the halls.  Objective:   VITALS:   Filed Vitals:   12/31/12 1327 12/31/12 2018 01/01/13 0516 01/01/13 0611  BP: 133/90 149/93 156/92   Pulse: 66 63 71   Temp: 97.8 F (36.6 C) 98.1 F (36.7 C) 98.2 F (36.8 C)   TempSrc: Oral Oral Axillary   Resp: 20 20 20    Height:      Weight:    174.7 kg (385 lb 2.3 oz)  SpO2: 97% 97% 98%     Her left leg has a area of blistering of the skin, secondary to her intense cellulitis and dermal separation. There is no purulence, no drainage, and no evidence for abscess. This appears to be superficial. The wound care nurses have been caring for her.   Lab Results  Component Value Date   WBC 8.2 01/01/2013   HGB 12.0 01/01/2013   HCT 34.8* 01/01/2013   MCV 84.3 01/01/2013   PLT 219 01/01/2013     Assessment/Plan:     Active Problems:  Morbid obesity with BMI of 70 and over, adult  Acute-on-chronic respiratory failure  Acute-on-chronic renal failure  Acute encephalopathy  Cellulitis  Hyponatremia  Hypovolemia  OSA (obstructive sleep apnea)  Obesity hypoventilation syndrome   She has a superficial skin breakdown/blistering, that is reasonably large, at least 10 x 15 cm, being treated with wound care currently, which appears to be the appropriate treatment. If the wound continues to breakdown, or becomes more problematic, then we may consider consultation with plastic surgery.   Marien Manship P 01/01/2013, 10:44 AM   [name], MD Cell [phone] Pager [phone]

## 2013-01-01 NOTE — Progress Notes (Signed)
[facility]  Subjective: Continues to have good uop 1850 reported for yesterday Creatinine has finally appeared to plateau  Objective: Vital signs in last 24 hours: Blood pressure 156/92, pulse 71, temperature 98.2 F (36.8 C), temperature source Axillary, resp. rate 20, height 5' 1.81" (1.57 m), weight 174.7 kg (385 lb 2.3 oz), last menstrual period 12/03/2012, SpO2 98.00%.  01/01/13 0611 ! 174.7 kg (385 lb 2.3 oz)  12/31/12 0506 ! 174.1 kg (383 lb 13.1 oz)  12/30/12 0902 ! 175.633 kg (387 lb 3.2 oz)   12/29/12 0323 ! 178.5 kg (393 lb 8.3 oz)   12/28/12 0449 ! 187.4 kg (413 lb 2.3 oz)  12/27/12 0500 ! 189 kg (416 lb 10.7 oz)  PHYSICAL EXAM Gen: NAD, alert Chest: Clear breath sounds. No crackles anteriorly Cardio: regular rate and rhythm, S1, S2 normal, no murmur GI: soft, non-tender; bowel sounds normal Extremities: no pitting edema  Neurologic: No focal deficit, no asterixis Skin:left leg now wrapped (hemorrhagic blister ruptured) Cellulitic change of right leg is better and redness both sides is receeding from the marked lines No asterixus  Lab Results:   Lab 01/01/13 0650 12/31/12 0700 12/30/12 0616 12/27/12 0505 12/26/12 0450  NA 137 137 136 -- --  K 4.0 3.8 4.4 -- --  CL 95* 96 96 -- --  CO2 22 20 18* -- --  BUN 96* 97* 95* -- --  CREATININE 11.04* 11.27* 10.92* -- --  ALB -- -- -- -- --  GLUCOSE 92 -- -- -- --  CALCIUM 9.3 8.9 8.7 -- --  PHOS 8.9* -- -- 9.0* 6.8*     Basename 01/01/13 0650 12/31/12 0700  WBC 8.2 8.2  HGB 12.0 11.8*  HCT 34.8* 34.1*  PLT 219 197     Scheduled Meds:    . calcium carbonate  400 mg of elemental calcium Oral TID WC  . ceFAZolin (ANCEF) IVPB - custom  1,000 mg Intravenous Q12H  . furosemide  40 mg Intravenous BID  . heparin subcutaneous  5,000 Units Subcutaneous Q8H  . insulin aspart  0-15 Units Subcutaneous TID WC  . insulin aspart  0-5 Units Subcutaneous QHS  . levothyroxine  150 mcg Oral QAC breakfast   . pantoprazole  40 mg Oral Q1200   Continuous Infusions:    . sodium chloride 20 mL/hr at 12/28/12 2229   PRN Meds:.  Assessment/Plan:  Pt is a 32 y.o. yo female with a PMHx of morbid obesity, OSA/OHS (not on CPAP at home) who was admitted to [facility] on 12/25/2012 for evaluation of altered mental status, hypercarbia requiring BiPap and acute renal failure.   # Acute Renal Failure:  Creatinine from 08/2011 of 1.00.  Likely ATN from combination of hypoperfusion to the kidneys from poor oral intake/ hypotension/ARB, in addition to rhabdomyolysis (peak CK only iin the 2000's) No evidence of hydronephrosis on [place].   Creatinine appears to be reaching plateau No dialysis indications Will need to see clear cut decrease/trend of improvement before she could be discharged Nonoliguric on modest dose lasix (although not sure the lasix is doing much - probably just autodiuresing) - so let's just stop that    # Hyponatremia:  Resolved.  # Respiratory Failure: on intermittent BiPap.   # Elevated LFT's: likely from shock liver. Trending down. Hepatitis panel negative. Per primary team  # Cellulitis:  on Ancef, improving.  Blood cultures negative. Per primary team Large hemorrhagic bullae on left calf - wound care to see - ruptured - now  wrapped  Ortho has also seen Area large but reportedly not infected looking # Acute encephalopathy: resolved.  #hyperphosphatemia- TUMS for phosphate binding Phos restrict diet       LOS: 7 days   Lenwood Balsam B 01/01/2013,12:29 PM

## 2013-01-02 LAB — COMPREHENSIVE METABOLIC PANEL
ALT: 31 U/L (ref 0–35)
AST: 18 U/L (ref 0–37)
Albumin: 2.7 g/dL — ABNORMAL LOW (ref 3.5–5.2)
Alkaline Phosphatase: 55 U/L (ref 39–117)
Potassium: 3.6 mEq/L (ref 3.5–5.1)
Sodium: 135 mEq/L (ref 135–145)
Total Protein: 7.3 g/dL (ref 6.0–8.3)

## 2013-01-02 LAB — GLUCOSE, CAPILLARY
Glucose-Capillary: 91 mg/dL (ref 70–99)
Glucose-Capillary: 132 mg/dL — ABNORMAL HIGH (ref 70–99)
Glucose-Capillary: 178 mg/dL — ABNORMAL HIGH (ref 70–99)
Glucose-Capillary: 103 mg/dL — ABNORMAL HIGH (ref 70–99)

## 2013-01-02 LAB — PHOSPHORUS: Phosphorus: 8.5 mg/dL — ABNORMAL HIGH (ref 2.3–4.6)

## 2013-01-02 NOTE — Progress Notes (Signed)
Occupational Therapy Treatment Patient Details Name: Alexandra Beck MRN: [id number] DOB: 10-Nov-1981 Today's Date: 01/02/2013 Time: 1115-1140 OT Time Calculation (min): 25 min  OT Assessment / Plan / Recommendation Comments on Treatment Session Pt making great progress with adls and adl mobilty.  Will keep on for one to two mores sessions to get to mod I level of independence.    Follow Up Recommendations  Home health OT;Other (comment) (although pt feels she does not need; will reevaluate)    Barriers to Discharge       Equipment Recommendations  Tub/shower bench;3 in 1 bedside comode    Recommendations for Other Services    Frequency Min 2X/week   Plan Discharge plan remains appropriate    Precautions / Restrictions Precautions Precautions: Fall Precaution Comments: sats stayed above 90 today walking in hallway and doing tub transfer on RA. Restrictions Weight Bearing Restrictions: No   Pertinent Vitals/Pain Pt with 2/10 pain in LLE.  Pt taken off of O2 during session and kept Sat over 90.  Nursing aware.  Left O2 off.     ADL  Lower Body Dressing: Performed;Moderate assistance;Other (comment) (discussed AE.  Partner to assist her.) Where Assessed - Lower Body Dressing: Unsupported sitting Toilet Transfer: Supervision/safety;Performed Toilet Transfer Method: Sit to Barista: Regular height toilet;Grab bars Toileting - Clothing Manipulation and Hygiene: Simulated;Supervision/safety;Other (comment) (w adapted toilet aid.) Where Assessed - Toileting Clothing Manipulation and Hygiene: Standing Tub/Shower Transfer: Performed;Minimal assistance;Other (comment) (stepping over side of tub.) Tub/Shower Transfer Method: Other (comment);Ambulating (step over side of tub.) Equipment Used: Cane;Other (comment) (then nothing.  did better with nothing per PT) Transfers/Ambulation Related to ADLs: S for all adl transfers and ambulation. ADL Comments: Pt not interested  in sock aid.  Feel she can donn socks sitting on the side of her bed and if she cannot, she will have her partner assist.  Introduced pt to toileting aid and she demonstrated/verbalized understanding.  Instructed her to get this item at [facility] gift store.    OT Diagnosis:    OT Problem List:   OT Treatment Interventions:     OT Goals Acute Rehab OT Goals OT Goal Formulation: With patient Time For Goal Achievement: 01/10/13 Potential to Achieve Goals: Good ADL Goals Pt Will Perform Grooming: with modified independence;Standing at sink ADL Goal: Grooming - Progress: Met Pt Will Perform Lower Body Bathing: with modified independence;Sit to stand from chair;Sit to stand from bed;with adaptive equipment Pt Will Perform Lower Body Dressing: with modified independence;Sit to stand from bed;Sit to stand from chair;with adaptive equipment ADL Goal: Lower Body Dressing - Progress: Progressing toward goals Pt Will Transfer to Toilet: with modified independence;Ambulation;with DME;Extra wide 3-in-1;Comfort height toilet ADL Goal: Toilet Transfer - Progress: Progressing toward goals Pt Will Perform Toileting - Clothing Manipulation: with modified independence;Standing ADL Goal: Toileting - Clothing Manipulation - Progress: Progressing toward goals Pt Will Perform Toileting - Hygiene: with modified independence;Sit to stand from 3-in-1/toilet ADL Goal: Toileting - Hygiene - Progress: Progressing toward goals Pt Will Perform Tub/Shower Transfer: Tub transfer;with modified independence;Ambulation;with DME;Transfer tub bench ADL Goal: Tub/Shower Transfer - Progress: Progressing toward goals Miscellaneous OT Goals Miscellaneous OT Goal #1: Pt will perform bed mobility with HOB flat at mod I level as precursor for EOB ADLs. Miscellaneous OT Goal #2: Pt will perform all functional mobility at mod I level. OT Goal: Miscellaneous Goal #2 - Progress: Progressing toward goals  Visit Information  Last OT Received  On: 01/02/13 Assistance Needed: +1 PT/OT Co-Evaluation/Treatment:  Yes    Subjective Data      Prior Functioning       Cognition  Cognition Overall Cognitive Status: Appears within functional limits for tasks assessed/performed Arousal/Alertness: Awake/alert Orientation Level: Appears intact for tasks assessed Behavior During Session: [facility] for tasks performed    Mobility  Transfers Transfers: Sit to Stand;Stand to Sit Sit to Stand: 5: Supervision;With armrests;From chair/3-in-1 Stand to Sit: 5: Supervision;With armrests;To chair/3-in-1 Details for Transfer Assistance: pt safe with transfers today.    Exercises      Balance Balance Balance Assessed: No   End of Session OT - End of Session Activity Tolerance: Patient tolerated treatment well Patient left: in chair;with call bell/phone within reach Nurse Communication: Mobility status;Other (comment) (nursing told that pt was off O2 w sat at 93% at end of tx.)  GO     Hope Budds [phone] 01/02/2013, 11:55 AM

## 2013-01-02 NOTE — Progress Notes (Signed)
Patient ID: Alexandra Beck, female   DOB: [birth year], 32 y.o.   MRN: [id number]     Subjective:  Patient was sleeping soundly on the CPAP machine would not respond.  Objective:   VITALS:   Filed Vitals:   01/01/13 4782 01/01/13 1414 01/01/13 2058 01/02/13 0611  BP:  165/95 170/101 142/86  Pulse:  73 71 78  Temp:  98.3 F (36.8 C) 98.2 F (36.8 C) 98.9 F (37.2 C)  TempSrc:  Oral Oral Oral  Resp:  20  18  Height:      Weight: 174.7 kg (385 lb 2.3 oz)   179 kg (394 lb 10 oz)  SpO2:  98% 97% 93%    ABD soft Lower ext. Wound looked as if it was unchanged from Dr Shelba Flake comments.    Lab Results  Component Value Date   WBC 8.2 01/01/2013   HGB 12.0 01/01/2013   HCT 34.8* 01/01/2013   MCV 84.3 01/01/2013   PLT 219 01/01/2013     Assessment/Plan:     Principal Problem:  *Acute-on-chronic respiratory failure Active Problems:  Morbid obesity with BMI of 70 and over, adult  Acute-on-chronic renal failure  Acute encephalopathy  Cellulitis  Hyponatremia  Hypovolemia  OSA (obstructive sleep apnea)  Obesity hypoventilation syndrome   Advance diet Up with therapy Will continue to monitor wound and if needed will consult Plastics for grafting, but probably not likely.   [name], [name] 01/02/2013, 9:00 AM   [name], MD Cell [phone] Pager [phone]

## 2013-01-02 NOTE — Progress Notes (Signed)
ANTIBIOTIC CONSULT NOTE - FOLLOW UP  Pharmacy Consult for Cefazolin Indication: LLE cellulitis/necrotizing dermatitis  Allergies  Allergen Reactions  . Aspirin     REACTION: throat swelling, hives  . Doxycycline Other (See Comments)    Abdominal pain  . Niaspan (Niacin Er)     Caused flushing    Patient Measurements: Height: 5' 1.81" (157 cm) Weight: 394 lb 10 oz (179 kg) IBW/kg (Calculated) : 49.67   Vital Signs: Temp: 98.9 F (37.2 C) (02/05 0611) Temp src: Oral (02/05 0611) BP: 142/86 mmHg (02/05 0611) Pulse Rate: 78  (02/05 0611) Intake/Output from previous day: 02/04 0701 - 02/05 0700 In: 881 [P.O.:771; I.V.:110] Out: 2050 [Urine:2050] Intake/Output from this shift:    Labs:  Basename 01/02/13 0610 01/01/13 0650 12/31/12 0700  WBC -- 8.2 8.2  HGB -- 12.0 11.8*  PLT -- 219 197  LABCREA -- -- --  CREATININE 10.72* 11.04* 11.27*   Estimated Creatinine Clearance: 12.1 ml/min (by C-G formula based on Cr of 10.72). No results found for this basename: VANCOTROUGH:2,VANCOPEAK:2,VANCORANDOM:2,GENTTROUGH:2,GENTPEAK:2,GENTRANDOM:2,TOBRATROUGH:2,TOBRAPEAK:2,TOBRARND:2,AMIKACINPEAK:2,AMIKACINTROU:2,AMIKACIN:2, in the last 72 hours   Microbiology: Recent Results (from the past 720 hour(s))  CULTURE, BLOOD (ROUTINE X 2)     Status: Normal   Collection Time   12/25/12  1:10 AM      Component Value Range Status Comment   Specimen Description BLOOD RIGHT ARM   Final    Special Requests BOTTLES DRAWN AEROBIC AND ANAEROBIC 10CC EA   Final    Culture  Setup Time 12/25/2012 04:54   Final    Culture NO GROWTH 5 DAYS   Final    Report Status 12/31/2012 FINAL   Final   CULTURE, BLOOD (ROUTINE X 2)     Status: Normal   Collection Time   12/25/12  1:25 AM      Component Value Range Status Comment   Specimen Description BLOOD RIGHT HAND   Final    Special Requests BOTTLES DRAWN AEROBIC ONLY 10CC   Final    Culture  Setup Time 12/25/2012 04:54   Final    Culture NO GROWTH 5 DAYS    Final    Report Status 12/31/2012 FINAL   Final   MRSA PCR SCREENING     Status: Normal   Collection Time   12/25/12  5:48 AM      Component Value Range Status Comment   MRSA by PCR NEGATIVE  NEGATIVE Final   URINE CULTURE     Status: Normal   Collection Time   12/25/12  6:16 AM      Component Value Range Status Comment   Specimen Description URINE, CATHETERIZED   Final    Special Requests NONE   Final    Culture  Setup Time 12/25/2012 06:52   Final    Colony Count NO GROWTH   Final    Culture NO GROWTH   Final    Report Status 12/26/2012 FINAL   Final     Anti-infectives     Start     Dose/Rate Route Frequency Ordered Stop   12/30/12 1230   ceFAZolin (ANCEF) 1,000 mg in dextrose 5 % 50 mL IVPB        1,000 mg 120 mL/hr over 30 Minutes Intravenous Every 12 hours 12/30/12 1221     12/26/12 2200   vancomycin (VANCOCIN) 2,000 mg in sodium chloride 0.9 % 500 mL IVPB  Status:  Discontinued        2,000 mg 250 mL/hr over 120 Minutes Intravenous Every  48 hours 12/26/12 1328 12/27/12 1133   12/26/12 1400   piperacillin-tazobactam (ZOSYN) IVPB 2.25 g  Status:  Discontinued        2.25 g 100 mL/hr over 30 Minutes Intravenous 3 times per day 12/26/12 1329 12/31/12 1322   12/25/12 1200   clindamycin (CLEOCIN) IVPB 900 mg  Status:  Discontinued        900 mg 100 mL/hr over 30 Minutes Intravenous 4 times per day 12/25/12 1003 12/27/12 1133   12/25/12 0600   piperacillin-tazobactam (ZOSYN) IVPB 3.375 g  Status:  Discontinued        3.375 g 12.5 mL/hr over 240 Minutes Intravenous 3 times per day 12/25/12 0428 12/26/12 1329   12/25/12 0445   vancomycin (VANCOCIN) 1,500 mg in sodium chloride 0.9 % 500 mL IVPB        1,500 mg 250 mL/hr over 120 Minutes Intravenous  Once 12/25/12 0427 12/25/12 1317   12/25/12 0115   clindamycin (CLEOCIN) IVPB 900 mg        900 mg 100 mL/hr over 30 Minutes Intravenous  Once 12/25/12 0105 12/25/12 0305   12/25/12 0115   vancomycin (VANCOCIN) IVPB 1000  mg/200 mL premix        1,000 mg 200 mL/hr over 60 Minutes Intravenous  Once 12/25/12 0105 12/25/12 0459          Assessment: 32 y.o. 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 Today is total abx D#9 this admission. The patient is noted to have acute kidney injury this admit -- likely ATN due to rhabdo/dehydration/ARB. SCr has now that to have peaked and trending down, the patient is producing UOP -- total for past 24 hours is 0.5 ml/kg/hr. Will continue with current Ancef dose for now but will consider increasing as renal function improves.   Goal of Therapy:  Proper antibiotics for infection/cultures adjusted for renal/hepatic function   Plan:  1. Continue Cefazolin 1g IV every 12 hours 2. Will continue to follow renal function, culture results, LOT, and antibiotic de-escalation plans   Georgina Pillion, PharmD, BCPS Clinical Pharmacist Pager: [phone] 01/02/2013 10:39 AM

## 2013-01-02 NOTE — Progress Notes (Signed)
[facility]  Subjective: Continues to have good uop Creatinine has finally appeared to plateau and is starting to decline a little  Objective: Vital signs in last 24 hours: Blood pressure 142/86, pulse 78, temperature 98.9 F (37.2 C), temperature source Oral, resp. rate 18, height 5' 1.81" (1.57 m), weight 179 kg (394 lb 10 oz), last menstrual period 12/03/2012, SpO2 93.00%.  01/02/13 0611 ! 179 kg (394 lb ??? (does not match with i/o records) 01/01/13 0611 ! 174.7 kg (385 lb 2.3 oz) 12/31/12 0506 ! 174.1 kg (383 lb 13.1 oz)  12/30/12 0902 ! 175.633 kg (387 lb 3.2 oz)   12/29/12 0323 ! 178.5 kg (393 lb 8.3 oz)   12/28/12 0449 ! 187.4 kg (413 lb 2.3 oz)  12/27/12 0500 ! 189 kg (416 lb 10.7 oz) I/O last 3 completed shifts: In: 1471 [P.O.:1071; I.V.:350; IV Piggyback:50] Out: 2400 [Urine:2400]    PHYSICAL EXAM Gen: NAD, alert, up in the chair coloring Chest: Clear breath sounds. No crackles anteriorly Cardio: regular rate and rhythm, S1, S2 normal, no murmur GI: soft, non-tender; bowel sounds normal Extremities: no pitting edema  Neurologic: No focal deficit, no asterixis Skin:left leg now wrapped (hemorrhagic blister ruptured) Cellulitic change of right leg is better and redness both sides is receeding from the marked lines No asterixus  Lab Results:   Lab 01/02/13 0610 01/01/13 0650 12/31/12 0700 12/27/12 0505  NA 135 137 137 --  K 3.6 4.0 3.8 --  CL 93* 95* 96 --  CO2 21 22 20  --  BUN 94* 96* 97* --  CREATININE 10.72* 11.04* 11.27* --  ALB -- -- -- --  GLUCOSE 91 -- -- --  CALCIUM 9.4 9.3 8.9 --  PHOS 8.5* 8.9* -- 9.0*   Results for TYNESHA, FREE (MRN [id number]) as of 01/02/2013 10:43  Ref. Range 01/02/2013 06:10  Alkaline Phosphatase Latest Range: 39-117 U/L 55  Albumin Latest Range: 3.5-5.2 g/dL 2.7 (L)  AST Latest Range: 0-37 U/L 18  ALT Latest Range: 0-35 U/L 31    Basename 01/01/13 0650 12/31/12 0700  WBC 8.2 8.2  HGB 12.0 11.8*  HCT 34.8*  34.1*  PLT 219 197   Scheduled Meds:    . acetaminophen  650 mg Oral Once  . calcium carbonate  400 mg of elemental calcium Oral TID WC  . ceFAZolin (ANCEF) IVPB - custom  1,000 mg Intravenous Q12H  . heparin subcutaneous  5,000 Units Subcutaneous Q8H  . insulin aspart  0-15 Units Subcutaneous TID WC  . insulin aspart  0-5 Units Subcutaneous QHS  . levothyroxine  150 mcg Oral QAC breakfast  . pantoprazole  40 mg Oral Q1200   Continuous Infusions:    . sodium chloride 20 mL/hr at 12/28/12 2229   PRN Meds:.  Assessment/Plan:  Pt is a 32 y.o. yo female with a PMHx of morbid obesity, OSA/OHS (not on CPAP at home) who was admitted to [facility] on 12/25/2012 for evaluation of altered mental status, hypercarbia requiring BiPap and acute renal failure.   # Acute Renal Failure:  Creatinine from 08/2011 of 1.00.  Likely ATN from combination of hypoperfusion to the kidneys from poor oral intake/ hypotension/ARB, in addition to rhabdomyolysis (although peak CK only iin the 2000's) No evidence of hydronephrosis on [place].   Creatinine appears to be reaching plateau and starting to decline a little No dialysis indications Autodiuresing at this point Continuing to follow  #hyperphosphatemia- TUMS for phosphate binding Phos restrict diet  # Cellulitis:  on Ancef, improving.  Blood cultures negative. Per primary team Large hemorrhagic bullae on left calf - wound care to see - ruptured - now wrapped  Ortho has also seen Area large but reportedly not infected looking May need plastics for skin grafting  # Respiratory Failure: on intermittent BiPap.  # Elevated LFT's:  Likely from shock liver Resolved # Acute encephalopathy: resolved.       LOS: 8 days   Travaughn Vue B 01/02/2013,10:44 AM

## 2013-01-02 NOTE — Progress Notes (Signed)
Physical Therapy Treatment Patient Details Name: Alexandra Beck MRN: [id number] DOB: [birth year] Today's Date: 01/02/2013 Time: 9147-8295 PT Time Calculation (min): 30 min  PT Assessment / Plan / Recommendation Comments on Treatment Session  Pt did well off of o2 with gait, as well as without RW.  Discussed d/c plans at length with patient.  She stated that even if she got a RW she honestly wouldn't use it at home.  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    Follow Up Recommendations  Home health PT     Does the patient have the potential to tolerate intense rehabilitation     Barriers to Discharge        Equipment Recommendations  None recommended by PT    Recommendations for Other Services    Frequency Min 3X/week   Plan Discharge plan remains appropriate;Frequency remains appropriate    Precautions / Restrictions Precautions Precautions: Fall Precaution Comments: sats stayed above 90 today walking in hallway and doing tub transfer on RA. Restrictions Weight Bearing Restrictions: No   Pertinent Vitals/Pain 2/10 pain L LE with activity    Mobility  Transfers Sit to Stand: 5: Supervision;With armrests Stand to Sit: 6: Modified independent (Device/Increase time);With armrests Details for Transfer Assistance: pt safe with transfers today. Ambulation/Gait Ambulation/Gait Assistance: 4: Min guard;5: Supervision Ambulation Distance (Feet): 140 Feet Assistive device: Lofstrands Ambulation/Gait Assistance Details: Pt ambulated 10 feet with lofstrand crutch (no cane nearby), and rest without ad. Gait Pattern: Lateral trunk lean to right;Lateral trunk lean to left General Gait Details: o2 sats styed in 90s throoughout gait.    Exercises     PT Diagnosis:    PT Problem List:   PT Treatment Interventions:     PT Goals Acute Rehab PT Goals PT Goal: Sit to Stand - Progress: Progressing toward goal PT  Transfer Goal: Bed to Chair/Chair to Bed - Progress: Progressing toward goal PT Goal: Ambulate - Progress: Progressing toward goal  Visit Information  Last PT Received On: 01/02/13 Assistance Needed: +1 PT/OT Co-Evaluation/Treatment: Yes    Subjective Data  Subjective: "I want to walk without the walker." Patient Stated Goal: To go home ASAP.  To ambulate without a walker and without o2.   Cognition  Cognition Overall Cognitive Status: Appears within functional limits for tasks assessed/performed Arousal/Alertness: Awake/alert Orientation Level: Appears intact for tasks assessed Behavior During Session: [facility] for tasks performed    Balance  Balance Balance Assessed: No High Level Balance High Level Balance Activites: Side stepping High Level Balance Comments: Pt did high side step over obstacle with use of rail and MIN A.  End of Session PT - End of Session Activity Tolerance: Patient tolerated treatment well Patient left: in chair;with call bell/phone within reach Nurse Communication: Other (comment) (o2 level)   GP     Adonys Wildes LUBECK 01/02/2013, 12:45 PM

## 2013-01-03 ENCOUNTER — Telehealth (INDEPENDENT_AMBULATORY_CARE_PROVIDER_SITE_OTHER): Payer: Self-pay | Admitting: Surgery

## 2013-01-03 LAB — COMPREHENSIVE METABOLIC PANEL
AST: 16 U/L (ref 0–37)
CO2: 23 mEq/L (ref 19–32)
Calcium: 9.3 mg/dL (ref 8.4–10.5)
Creatinine, Ser: 9.99 mg/dL — ABNORMAL HIGH (ref 0.50–1.10)
GFR calc Af Amer: 5 mL/min — ABNORMAL LOW (ref >90)
GFR calc non Af Amer: 5 mL/min — ABNORMAL LOW (ref >90)
Total Protein: 7.6 g/dL (ref 6.0–8.3)

## 2013-01-03 LAB — GLUCOSE, CAPILLARY: Glucose-Capillary: 90 mg/dL (ref 70–99)

## 2013-01-03 MED ORDER — ATORVASTATIN CALCIUM 10 MG PO TABS
10.0000 mg | ORAL_TABLET | Freq: Every day | ORAL | Status: DC
  Administered 2013-01-03 – 2013-01-05 (×3): 10 mg via ORAL
  Filled 2013-01-03 (×4): qty 1

## 2013-01-03 MED ORDER — SODIUM CHLORIDE 0.9 % IV SOLN
INTRAVENOUS | Status: DC
  Administered 2013-01-03: 23:00:00 via INTRAVENOUS
  Administered 2013-01-04: 50 mL/h via INTRAVENOUS
  Administered 2013-01-04 (×2): via INTRAVENOUS

## 2013-01-03 MED ORDER — DEXTROSE 5 % IV SOLN
2000.0000 mg | Freq: Two times a day (BID) | INTRAVENOUS | Status: DC
  Administered 2013-01-03 – 2013-01-04 (×2): 2000 mg via INTRAVENOUS
  Filled 2013-01-03 (×3): qty 20

## 2013-01-03 NOTE — Progress Notes (Signed)
ANTIBIOTIC CONSULT NOTE - FOLLOW UP  Pharmacy Consult for Cefazolin Indication: LLE cellulitis/necrotizing dermatitis  Allergies  Allergen Reactions  . Aspirin     REACTION: throat swelling, hives  . Doxycycline Other (See Comments)    Abdominal pain  . Niaspan (Niacin Er)     Caused flushing    Patient Measurements: Height: 5' 1.81" (157 cm) Weight: 386 lb 11 oz (175.4 kg) IBW/kg (Calculated) : 49.67   Vital Signs: Temp: 98.1 F (36.7 C) (02/06 1400) Temp src: Oral (02/06 1400) BP: 167/83 mmHg (02/06 1400) Pulse Rate: 66  (02/06 1400)  Intake/Output from previous day: 02/05 0701 - 02/06 0700 In: 500 [P.O.:240; I.V.:260] Out: 1120 [Urine:1120]  Intake/Output from this shift: Total I/O In: 240 [P.O.:240] Out: 750 [Urine:750]  Labs:  Basename 01/03/13 0600 01/02/13 0610 01/01/13 0650  WBC -- -- 8.2  HGB -- -- 12.0  PLT -- -- 219  LABCREA -- -- --  CREATININE 9.99* 10.72* 11.04*   Estimated Creatinine Clearance: 12.8 ml/min (by C-G formula based on Cr of 9.99).  Microbiology: Recent Results (from the past 720 hour(s))  CULTURE, BLOOD (ROUTINE X 2)     Status: Normal   Collection Time   12/25/12  1:10 AM      Component Value Range Status Comment   Specimen Description BLOOD RIGHT ARM   Final    Special Requests BOTTLES DRAWN AEROBIC AND ANAEROBIC 10CC EA   Final    Culture  Setup Time 12/25/2012 04:54   Final    Culture NO GROWTH 5 DAYS   Final    Report Status 12/31/2012 FINAL   Final   CULTURE, BLOOD (ROUTINE X 2)     Status: Normal   Collection Time   12/25/12  1:25 AM      Component Value Range Status Comment   Specimen Description BLOOD RIGHT HAND   Final    Special Requests BOTTLES DRAWN AEROBIC ONLY 10CC   Final    Culture  Setup Time 12/25/2012 04:54   Final    Culture NO GROWTH 5 DAYS   Final    Report Status 12/31/2012 FINAL   Final   MRSA PCR SCREENING     Status: Normal   Collection Time   12/25/12  5:48 AM      Component Value Range Status  Comment   MRSA by PCR NEGATIVE  NEGATIVE Final   URINE CULTURE     Status: Normal   Collection Time   12/25/12  6:16 AM      Component Value Range Status Comment   Specimen Description URINE, CATHETERIZED   Final    Special Requests NONE   Final    Culture  Setup Time 12/25/2012 06:52   Final    Colony Count NO GROWTH   Final    Culture NO GROWTH   Final    Report Status 12/26/2012 FINAL   Final     Assessment: 32 y.o. 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 Today is total abx D#10 this admission (Day #5 of cefazolin). SCr trending down, afebrile, blood cultures negative.  Goal of Therapy:  Eradication of infection  Plan:  1. Increase cefazolin to 2g IV every 12 hours 2. Will continue to follow renal function, culture results, LOT, and antibiotic     Benjaman Pott, PharmD, BCPS 01/03/2013   3:16 PM

## 2013-01-03 NOTE — Progress Notes (Signed)
[facility]  Subjective: Continues to have good uop Creatinine clearly declining now Primary started some IVF at 75/hour (OK with me)  Objective: Vital signs in last 24 hours: Blood pressure 149/89, pulse 83, temperature 98.1 F (36.7 C), temperature source Oral, resp. rate 18, height 5' 1.81" (1.57 m), weight 175.4 kg (386 lb 11 oz), last menstrual period 12/03/2012, SpO2 95.00%.  01/03/13 0400 ! 175.4 kg (386 lb  01/02/13 0611 ! 179 kg (394 lb ??? (does not match with i/o records) 01/01/13 0611 ! 174.7 kg (385 lb 2.3 oz) 12/31/12 0506 ! 174.1 kg (383 lb 13.1 oz)  12/30/12 0902 ! 175.633 kg (387 lb 3.2 oz)   12/29/12 0323 ! 178.5 kg (393 lb 8.3 oz)   12/28/12 0449 ! 187.4 kg (413 lb 2.3 oz)  12/27/12 0500 ! 189 kg (416 lb 10.7 oz) I/O last 3 completed shifts: In: 850 [P.O.:480; I.V.:370] Out: 2070 [Urine:2070]   PHYSICAL EXAM Gen: NAD, alert, up in the chair coloring Chest: Clear breath sounds. No crackles anteriorly Cardio: regular rate and rhythm, S1, S2 normal, no murmur GI: soft, non-tender; bowel sounds normal Extremities: no pitting edema  Neurologic: No focal deficit, no asterixis Skin:left leg now wrapped (hemorrhagic blister ruptured) wrap not removed Cellulitic change of right leg is better and redness both sides has receded from the marked lines No asterixus  Lab Results:   Lab 01/03/13 0600 01/02/13 0610 01/01/13 0650  NA 136 135 137  K 3.9 3.6 4.0  CL 95* 93* 95*  CO2 23 21 22   BUN 95* 94* 96*  CREATININE 9.99* 10.72* 11.04*  ALB -- -- --  GLUCOSE 100* -- --  CALCIUM 9.3 9.4 9.3  PHOS 7.5* 8.5* 8.9*   Results for Alexandra Beck, Alexandra Beck (MRN [id number]) as of 01/02/2013 10:43  Ref. Range 01/02/2013 06:10  Alkaline Phosphatase Latest Range: 39-117 U/L 55  Albumin Latest Range: 3.5-5.2 g/dL 2.7 (L)  AST Latest Range: 0-37 U/L 18  ALT Latest Range: 0-35 U/L 31    Basename 01/01/13 0650  WBC 8.2  HGB 12.0  HCT 34.8*  PLT 219   Scheduled  Meds:    . acetaminophen  650 mg Oral Once  . atorvastatin  10 mg Oral q1800  . calcium carbonate  400 mg of elemental calcium Oral TID WC  . ceFAZolin (ANCEF) IVPB - custom  1,000 mg Intravenous Q12H  . heparin subcutaneous  5,000 Units Subcutaneous Q8H  . insulin aspart  0-15 Units Subcutaneous TID WC  . insulin aspart  0-5 Units Subcutaneous QHS  . levothyroxine  150 mcg Oral QAC breakfast  . pantoprazole  40 mg Oral Q1200   Continuous Infusions:    . sodium chloride 75 mL/hr at 01/03/13 0908   PRN Meds:.  Assessment/Plan:  Pt is a 32 y.o. yo female with a PMHx of morbid obesity, OSA/OHS (not on CPAP at home) who was admitted to [facility] on 12/25/2012 for evaluation of altered mental status, hypercarbia requiring BiPap and acute renal failure.   # Acute Renal Failure:  Creatinine from 08/2011 of 1.00.  Likely ATN from combination of hypoperfusion to the kidneys from poor oral intake/ hypotension/ARB, in addition to rhabdomyolysis (although peak CK only iin the 2000's) No evidence of hydronephrosis on [place].   Creatinine is now clearly declining Will not require dialysis Autodiuresing at this point OK with me to get some IVF as ordered by primary service  #hyperphosphatemia- TUMS for phosphate binding (should not need long term if  renal continues to improve) Phos restrict diet  # Cellulitis:  on Ancef, improving.  Blood cultures negative.  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

## 2013-01-03 NOTE — Telephone Encounter (Signed)
01/03/13 We received a request from staff at [facility] that Alexandra Beck was inpatient at [facility] and would like information on the bariatric surgery program. I visited with the patient this morning and gave the patient bariatric brochures. I also gave the patient our seminar packet and went over the pathway to surgery. I advised the patient she is more than welcome to attend a [facility] Weight Loss Seminar @ [facility] when she feels better or she may turn in her packet and we will schedule a 1-on-1 consult with a Careers adviser. cef

## 2013-01-03 NOTE — Progress Notes (Signed)
Physical Therapy Treatment Patient Details Name: Alexandra Beck MRN: [id number] DOB: 14-Feb-1981 Today's Date: 01/03/2013 Time: 1204-1227 PT Time Calculation (min): 23 min  PT Assessment / Plan / Recommendation Comments on Treatment Session  Pt is Mod Independent with basic mobility and has met PT goals. Pt is D/C from PT services. Recommend QD walks in the hall without assistance for endurance. Pt agreeable to recommendations and D/C from PT services.    Follow Up Recommendations        Does the patient have the potential to tolerate intense rehabilitation     Barriers to Discharge        Equipment Recommendations       Recommendations for Other Services    Frequency     Plan All goals met and education completed, patient dischaged from PT services    Precautions / Restrictions     Pertinent Vitals/Pain     Mobility  Bed Mobility Supine to Sit: 7: Independent Sitting - Scoot to Edge of Bed: 7: Independent Transfers Transfers: Stand to Sit Sit to Stand: 7: Independent Stand to Sit: 7: Independent Ambulation/Gait Ambulation/Gait Assistance: 7: Independent Ambulation Distance (Feet): 200 Feet Assistive device: None Ambulation/Gait Assistance Details: mod Independent on level surfaces Gait Pattern: Decreased stride length Stairs: Yes Stairs Assistance: 6: Modified independent (Device/Increase time) Stair Management Technique: Two rails;Alternating pattern Number of Stairs: 4     Exercises     PT Diagnosis:    PT Problem List:   PT Treatment Interventions:     PT Goals Acute Rehab PT Goals PT Goal: Supine/Side to Sit - Progress: Met PT Goal: Sit to Stand - Progress: Met PT Transfer Goal: Bed to Chair/Chair to Bed - Progress: Met PT Goal: Ambulate - Progress: Met PT Goal: Up/Down Stairs - Progress: Met  Visit Information  Last PT Received On: 01/03/13    Subjective Data  Subjective: I feel better. Patient Stated Goal: To go home.   Cognition   Cognition Overall Cognitive Status: Appears within functional limits for tasks assessed/performed Arousal/Alertness: Awake/alert Orientation Level: Appears intact for tasks assessed Behavior During Session: [facility] for tasks performed    Balance  Static Standing Balance Static Standing - Balance Support: No upper extremity supported Static Standing - Level of Assistance: 6: Modified independent (Device/Increase time)  End of Session PT - End of Session Activity Tolerance: Patient tolerated treatment well Patient left: in chair;with family/visitor present Nurse Communication: Mobility status   GP     Greggory Stallion 01/03/2013, 1:41 PM

## 2013-01-03 NOTE — Progress Notes (Signed)
Physical Therapy Discharge Patient Details Name: Alexandra Beck MRN: [id number] DOB: 09-May-1981 Today's Date: 01/03/2013 Time: 1204-1227 PT Time Calculation (min): 23 min  Patient discharged from PT services secondary to goals met and no further PT needs identified.  Please see latest therapy progress note for current level of functioning and progress toward goals.    Progress and discharge plan discussed with patient and/or caregiver: Patient/Caregiver agrees with plan      Greggory Stallion 01/03/2013, 1:42 PM

## 2013-01-03 NOTE — Progress Notes (Signed)
PATIENT DETAILS Name: Alexandra Beck Age: 32 y.o. Sex: female Date of Birth: December 05, 1980 Admit Date: 12/25/2012 Admitting Physician Lonia Farber, MD EAV:WUJWJ,XBJYN J, MD  Subjective: No Complaints.  Anxious for progress  Assessment/Plan:  Acute on chronic renal failure - Likely acute tubular necrosis - multifactorial causes- hypotension, diuretics, ARB's and rhabdomyolysis - Creatinine finally leveled out and started to trend down.  Renal requests 5.0 or less to d/c - Urine output is good, no azotemia, no back pain.  - Diuretics discontinued.  Acute on chronic respiratory failure  -in setting of OSA/OHS, CPAP/BiPAP noncompliance, opioids. - Will ask case management to make sure patient has nocturnal BiPAP on discharge - Patient doing well with nocturnal bi-pap during hospital stay.  Left lower extremity cellulitis -Scabbed over.  Slow improvement. - Patient reports erythema began the day of admission, and brought her to the hospital. - Erythema has come down when compared to the traced areas, but she has developed significant blistering/bulla (2/1) covering her left calf. - seen by ortho who recommended wound care -Wound care RN has consulted.  We appreciate recommendations.  Will order [facility] RN for dressing changes. - Dopplers of the left lower extremity done on 1/28-negative for DVT - CT scan of the left leg than on 1/28-showed cellulitis without myofascitis or osteomyelitis or abscess - Continue with Ancef  Acute encephalopathy -Resolved. -Secondary to Acute respiratory failure, acute renal failure  Chest Discomfort - Suspect GI etiology- now on PPI - Cardiac enzymes negative - no further complaints of chest discomfort  Rhabdomyolysis - CK has normalized  Transaminitis - Resolved. - Likely secondary to shock liver and rhabdomyolysis  Diabetes - Continue with SSI.. Cbgs 80 - 130 - Metformin currently contraindicated with renal failure - A1c  6.2  Hypothyroid - Continue with levothyroxine  Hypertension - Hold metoprolol, HYZAAR -Moderate control on no meds.  Will need to determine outpatient regimen.  Dyslipidemia - Hold statins currently due to elevated LFTs.  Will add back soon.  Morbid obesity with BMI of 70 and over, adult - Counseled extensively regarding the importance of weight loss - Discussed possible out patient consultation with a bariatric surgeon   Disposition: Remain inpatient  DVT Prophylaxis: Start Prophylactic  Heparin  Code Status: Full code  Procedures:  None  CONSULTS: Pulmonary/intensive care Nephrology Orthopedics Wound Care  PHYSICAL EXAM: Vital signs in last 24 hours:  01/02/13 0611  BP: 142/86  Pulse: 78  Temp: 98.9 F   TempSrc: Oral  Resp: 18  Height:   Weight:   SpO2: 93%    Gen Exam: Awake and alert, sitting in chair Chest: CTA no w/c/r, no accessory muscle use. CVS: RRR, no m/r/g.  Abdomen: massive, soft, BS +, non tender, non distended.  Skin: lower extremities warm to touch. Erythema-in the left lower mid calf area. large scabbed/red wound (16 x 10 cm)  in the post aspect of the calf. Nontender left lower extremity, sensation is intact.  Mild erythema in the distal right lower extremity. Neurologic: Non Focal.      LAB RESULTS: CBC  Lab 01/01/13 0650 12/31/12 0700 12/30/12 0616 12/29/12 0250 12/28/12 0655  WBC 8.2 8.2 7.6 7.5 5.9  HGB 12.0 11.8* 11.7* 11.6* 11.9*  HCT 34.8* 34.1* 35.0* 34.7* 35.9*  PLT 219 197 181 166 141*  MCV 84.3 84.0 85.2 85.7 86.5  MCH 29.1 29.1 28.5 28.6 28.7  MCHC 34.5 34.6 33.4 33.4 33.1  RDW 14.4 14.5 14.5 14.5 14.4  LYMPHSABS -- -- -- -- --  MONOABS -- -- -- -- --  EOSABS -- -- -- -- --  BASOSABS -- -- -- -- --  BANDABS -- -- -- -- --    Chemistries   Lab 01/02/13 0610 01/01/13 0650 12/31/12 0700 12/30/12 0616  NA 135 137 137 136  K 3.6 4.0 3.8 4.4  CL 93* 95* 96 96  CO2 21 22 20  18*  GLUCOSE 91 92 116* 90  BUN  94* 96* 97* 95*  CREATININE 10.72* 11.04* 11.27* 10.92*  CALCIUM 9.4 9.3 8.9 8.7  MG -- -- -- --    CBG:  Lab 01/02/13 2127 01/02/13 1819 01/02/13 1205 01/02/13 0759 01/01/13 2111  GLUCAP 103* 178* 132* 91 95    GFR Estimated Creatinine Clearance: 12.8 ml/min (by C-G formula based on Cr of 9.99).  Coagulation profile  Lab 12/28/12 0655  INR 1.33  PROTIME --    Cardiac Enzymes  Lab 12/29/12 1300 12/29/12 0250 12/28/12 1959  CKMB -- -- --  TROPONINI <0.30 <0.30 <0.30  MYOGLOBIN -- -- --    MICROBIOLOGY: Recent Results (from the past 240 hour(s))  CULTURE, BLOOD (ROUTINE X 2)     Status: Normal   Collection Time   12/25/12  1:10 AM      Component Value Range Status Comment   Specimen Description BLOOD RIGHT ARM   Final    Special Requests BOTTLES DRAWN AEROBIC AND ANAEROBIC 10CC EA   Final    Culture  Setup Time 12/25/2012 04:54   Final    Culture NO GROWTH 5 DAYS   Final    Report Status 12/31/2012 FINAL   Final   CULTURE, BLOOD (ROUTINE X 2)     Status: Normal   Collection Time   12/25/12  1:25 AM      Component Value Range Status Comment   Specimen Description BLOOD RIGHT HAND   Final    Special Requests BOTTLES DRAWN AEROBIC ONLY 10CC   Final    Culture  Setup Time 12/25/2012 04:54   Final    Culture NO GROWTH 5 DAYS   Final    Report Status 12/31/2012 FINAL   Final   MRSA PCR SCREENING     Status: Normal   Collection Time   12/25/12  5:48 AM      Component Value Range Status Comment   MRSA by PCR NEGATIVE  NEGATIVE Final   URINE CULTURE     Status: Normal   Collection Time   12/25/12  6:16 AM      Component Value Range Status Comment   Specimen Description URINE, CATHETERIZED   Final    Special Requests NONE   Final    Culture  Setup Time 12/25/2012 06:52   Final    Colony Count NO GROWTH   Final    Culture NO GROWTH   Final    Report Status 12/26/2012 FINAL   Final     RADIOLOGY STUDIES/RESULTS: Ct Femur Left Wo Contrast  12/25/2012  *RADIOLOGY  REPORT*  Clinical Data: Pain and swelling.  CT OF THE LEFT FEMUR WITHOUT CONTRAST,CT TIBIA FIBULA LEFT WITHOUT CONTRAST  Comparison: None.  Findings: Mild diffuse cellulitis involving the left lower extremity most notably in the mid distal tibia / fibula region.  No discrete soft tissue abscess or findings for myofasciitis.  The bony structures are intact.  No findings to suggest septic arthritis or osteomyelitis.  Slightly enlarged / inflamed left inguinal lymph nodes are noted.  IMPRESSION: Cellulitis without findings for focal soft  tissue abscess, myofasciitis, septic arthritis or osteomyelitis.   Original Report Authenticated By: Rudie Meyer, M.D.    Ct Tibia Fibula Left Wo Contrast  12/25/2012  *RADIOLOGY REPORT*  Clinical Data: Pain and swelling.  CT OF THE LEFT FEMUR WITHOUT CONTRAST,CT TIBIA FIBULA LEFT WITHOUT CONTRAST  Comparison: None.  Findings: Mild diffuse cellulitis involving the left lower extremity most notably in the mid distal tibia / fibula region.  No discrete soft tissue abscess or findings for myofasciitis.  The bony structures are intact.  No findings to suggest septic arthritis or osteomyelitis.  Slightly enlarged / inflamed left inguinal lymph nodes are noted.  IMPRESSION: Cellulitis without findings for focal soft tissue abscess, myofasciitis, septic arthritis or osteomyelitis.   Original Report Authenticated By: Rudie Meyer, M.D.    US Renal Port  12/25/2012  *RADIOLOGY REPORT*  Clinical Data: Worsening renal function.  RENAL/URINARY TRACT ULTRASOUND COMPLETE  Comparison:  MRI 12/28/2005  Findings:  Right Kidney:  12.0 cm. Normal size and echotexture.  No focal abnormality.  No hydronephrosis.  Left Kidney:  12.8 cm. Normal size and echotexture.  No focal abnormality.  No hydronephrosis.  Bladder:  Not visualized.  IMPRESSION: No acute renal abnormality.  No hydronephrosis.  Nonvisualization of the bladder.   Original Report Authenticated By: Charlett Nose, M.D.    Dg Chest Port 1  View  12/27/2012  *RADIOLOGY REPORT*  Clinical Data: Respiratory failure.  Shortness of breath.  PORTABLE CHEST - 1 VIEW  Comparison: Chest 12/26/2012 and 12/25/2012.  PA and lateral chest 09/16/2011.  Findings: Right IJ catheter remains in place.  There is cardiomegaly and mild vascular congestion.  No consolidative process, pneumothorax or effusion.  IMPRESSION: Cardiomegaly and mild vascular congestion.   Original Report Authenticated By: Holley Dexter, M.D.    Dg Chest Port 1 View  12/26/2012  *RADIOLOGY REPORT*  Clinical Data: Shortness of breath.  Respiratory failure.  PORTABLE CHEST - 1 VIEW  Comparison: 12/25/2012  Findings: Central line tip is in the superior vena cava in good position.  There is new slight pulmonary vascular congestion.  No consolidative infiltrates or effusions.  The heart size is normal.  IMPRESSION: New pulmonary vascular congestion.   Original Report Authenticated By: Francene Boyers, M.D.    Dg Chest Port 1 View  12/25/2012  *RADIOLOGY REPORT*  Clinical Data: Central line placement  PORTABLE CHEST - 1 VIEW  Comparison: Prior chest x-ray performed earlier today, 12/25/2012 at 01:23 a.m.  Findings: Interval placement of a right IJ central venous catheter. The tip projects over the superior cavoatrial junction.  No evidence of complicating pneumothorax.  Inspiratory volumes are low and there is increased bibasilar atelectasis.  Stable cardiomegaly. No acute osseous abnormality.  Mild pulmonary vascular congestion.  IMPRESSION:  1.  Interval placement of a right IJ central venous catheter without evidence of complicating pneumothorax.  The tip of the catheter projects over the superior cavoatrial junction. 2.  Lower inspiratory volumes with increased bibasilar atelectasis 3.  Mild pulmonary vascular congestion   Original Report Authenticated By: Malachy Moan, M.D.    Dg Chest Port 1 View  12/25/2012  *RADIOLOGY REPORT*  Clinical Data: Hypoxia, shortness of breath.  PORTABLE  CHEST - 1 VIEW  Comparison: 09/16/2011  Findings: Hypoaeration.  Heart size upper normal to mildly enlarged.  Mild bibasilar opacities.  No definite pleural effusion or pneumothorax.  No acute osseous finding.  IMPRESSION: Hypoaeration with mild bibasilar opacities; atelectasis versus early infiltrate.   Original Report Authenticated By: Jearld Lesch,  M.D.     MEDICATIONS: Scheduled Meds:    . acetaminophen  650 mg Oral Once  . calcium carbonate  400 mg of elemental calcium Oral TID WC  . ceFAZolin (ANCEF) IVPB - custom  1,000 mg Intravenous Q12H  . heparin subcutaneous  5,000 Units Subcutaneous Q8H  . insulin aspart  0-15 Units Subcutaneous TID WC  . insulin aspart  0-5 Units Subcutaneous QHS  . levothyroxine  150 mcg Oral QAC breakfast  . pantoprazole  40 mg Oral Q1200   Continuous Infusions:    . sodium chloride 20 mL/hr at 12/28/12 2229   PRN Meds:.albuterol, gi cocktail, sodium chloride  Antibiotics: Anti-infectives     Start     Dose/Rate Route Frequency Ordered Stop   12/30/12 1230   ceFAZolin (ANCEF) 1,000 mg in dextrose 5 % 50 mL IVPB        1,000 mg 120 mL/hr over 30 Minutes Intravenous Every 12 hours 12/30/12 1221     12/26/12 2200   vancomycin (VANCOCIN) 2,000 mg in sodium chloride 0.9 % 500 mL IVPB  Status:  Discontinued        2,000 mg 250 mL/hr over 120 Minutes Intravenous Every 48 hours 12/26/12 1328 12/27/12 1133   12/26/12 1400   piperacillin-tazobactam (ZOSYN) IVPB 2.25 g  Status:  Discontinued        2.25 g 100 mL/hr over 30 Minutes Intravenous 3 times per day 12/26/12 1329 12/31/12 1322   12/25/12 1200   clindamycin (CLEOCIN) IVPB 900 mg  Status:  Discontinued        900 mg 100 mL/hr over 30 Minutes Intravenous 4 times per day 12/25/12 1003 12/27/12 1133   12/25/12 0600   piperacillin-tazobactam (ZOSYN) IVPB 3.375 g  Status:  Discontinued        3.375 g 12.5 mL/hr over 240 Minutes Intravenous 3 times per day 12/25/12 0428 12/26/12 1329   12/25/12  0445   vancomycin (VANCOCIN) 1,500 mg in sodium chloride 0.9 % 500 mL IVPB        1,500 mg 250 mL/hr over 120 Minutes Intravenous  Once 12/25/12 0427 12/25/12 1317   12/25/12 0115   clindamycin (CLEOCIN) IVPB 900 mg        900 mg 100 mL/hr over 30 Minutes Intravenous  Once 12/25/12 0105 12/25/12 0305   12/25/12 0115   vancomycin (VANCOCIN) IVPB 1000 mg/200 mL premix        1,000 mg 200 mL/hr over 60 Minutes Intravenous  Once 12/25/12 0105 12/25/12 0459           Conley Canal [facility] Pager:[phone]  If 7PM-7AM, please contact night-coverage [email or web address] Password [email or web address]

## 2013-01-03 NOTE — Progress Notes (Signed)
Assisted pt. With setting up for cpap, as per MD request. Pt. States she is comfortable with the setting. Pt. Takes her mask on and off at times and states she will place her mask on herself when she is ready for bed. RT stated to pt to notify if she needs any assistance. RT will inform RN.

## 2013-01-03 NOTE — Progress Notes (Signed)
-   I have reviewed the data as above and agree with current plan. - Continue monitor creatinine daily basis. - Appreciate nephrology's assistance. - Continue Ancef

## 2013-01-03 NOTE — Progress Notes (Signed)
[facility] PROGRESS NOTE  Alexandra Beck:[id number] DOB: 17-Mar-1981 DOA: 12/25/2012 PCP: Geraldo Pitter, MD  Assessment/Plan: Acute on chronic renal failure  - Likely acute tubular necrosis  - multifactorial causes- hypotension, diuretics, ARB's and rhabdomyolysis  - Creatinine has started to trend down (9.9 on 2/6). Renal requests 5.0 or less to d/c  - Urine output is good, no azotemia, no back pain.  - Diuretics discontinued.   Acute on chronic respiratory failure  -in setting of OSA/OHS, CPAP/BiPAP noncompliance, opioids.  - Will ask case management to make sure patient has nocturnal BiPAP on discharge  - Patient doing well with nocturnal bi-pap during hospital stay.   Left lower extremity cellulitis  -Scabbed over. Slow improvement. Appreciate wound care. - Patient reports erythema began the day of admission, and brought her to the hospital.  - Erythema has come down when compared to the traced areas, but she has developed significant blistering/bulla (2/1) covering her left calf.  - seen by ortho who recommended wound care  -Wound care RN has consulted. We appreciate recommendations. Will order [facility] RN for dressing changes.  - Dopplers of the left lower extremity done on 1/28-negative for DVT  - CT scan of the left leg than on 1/28-showed cellulitis without myofascitis or osteomyelitis or abscess  - Continue with Ancef   Acute encephalopathy  -Resolved.  -Secondary to Acute respiratory failure, acute renal failure   Rhabdomyolysis  - CK has normalized   Transaminitis  - Resolved.  - Likely secondary to shock liver and rhabdomyolysis   Diabetes  - Continue with SSI.. Cbgs 80 - 130  - Metformin currently contraindicated with renal failure  - A1c 6.2   Hypothyroid  - Continue with levothyroxine   Hypertension  - Hold metoprolol, HYZAAR  -Moderate control on no meds. Allow moderate hypertension for kidney perfusion.  Dyslipidemia  - Originally held statins  currently due to elevated LFTs.  - LFTs normalized.  - Add lipitor 2/6  Morbid obesity with BMI of 70 and over, adult  - Counseled extensively regarding the importance of weight loss  - Discussed possible out patient consultation with a bariatric surgeon   Disposition:  Remain inpatient until creatinine returns to a safe level and ok with Renal to d/c  DVT Prophylaxis:  Start Prophylactic Heparin   Code Status:  Full code   Procedures:  None  CONSULTS:  Pulmonary/intensive care  Nephrology  Orthopedics  Wound Care    HPI/Subjective: Would like FMLA papers filled out.  Objective: Filed Vitals:   01/02/13 2136 01/03/13 0047 01/03/13 0400 01/03/13 0423  BP: 159/96   149/89  Pulse: 65 69  83  Temp: 98.4 F (36.9 C)   98.1 F (36.7 C)  TempSrc: Oral   Oral  Resp: 18 18  18   Height:      Weight:   175.4 kg (386 lb 11 oz)   SpO2: 94% 94%  95%    Intake/Output Summary (Last 24 hours) at 01/03/13 0815 Last data filed at 01/03/13 0429  Gross per 24 hour  Intake    380 ml  Output   1120 ml  Net   -740 ml   Filed Weights   01/01/13 0611 01/02/13 0611 01/03/13 0400  Weight: 174.7 kg (385 lb 2.3 oz) 179 kg (394 lb 10 oz) 175.4 kg (386 lb 11 oz)    Exam:   General:  Awake, Alert, Bipap on, patient lying in bed, very pleasant  Cardiovascular: slightly brady, reg rhythm, no  m/r/g  Respiratory: cta no w/c/r, no accessory muscle use  Abdomen: massive, non tender, soft, non distended, no appreciable mass  Extremities:  LLE with bandage over wound (clean and dry), right lower extremity slightly pink (chronic?).    Skin:  Dry on lower extremities.  Data Reviewed: Basic Metabolic Panel:  Lab 01/03/13 5409 01/02/13 0610 01/01/13 0650 12/31/12 0700 12/30/12 0616  NA 136 135 137 137 136  K 3.9 3.6 4.0 3.8 4.4  CL 95* 93* 95* 96 96  CO2 23 21 22 20  18*  GLUCOSE 100* 91 92 116* 90  BUN 95* 94* 96* 97* 95*  CREATININE 9.99* 10.72* 11.04* 11.27* 10.92*  CALCIUM  9.3 9.4 9.3 8.9 8.7  MG -- -- -- -- --  PHOS 7.5* 8.5* 8.9* -- --   Liver Function Tests:  Lab 01/03/13 0600 01/02/13 0610 01/01/13 0650 12/31/12 0700 12/30/12 0616  AST 16 18 26  37 56*  ALT 6 31 140* 364* 640*  ALKPHOS 55 55 57 55 56  BILITOT 0.2* 0.2* 0.3 0.4 0.4  PROT 7.6 7.3 7.2 6.8 6.7  ALBUMIN 2.8* 2.7* 2.6* 2.5* 2.4*   CBC:  Lab 01/01/13 0650 12/31/12 0700 12/30/12 0616 12/29/12 0250 12/28/12 0655  WBC 8.2 8.2 7.6 7.5 5.9  NEUTROABS -- -- -- -- --  HGB 12.0 11.8* 11.7* 11.6* 11.9*  HCT 34.8* 34.1* 35.0* 34.7* 35.9*  MCV 84.3 84.0 85.2 85.7 86.5  PLT 219 197 181 166 141*   Cardiac Enzymes:  Lab 01/01/13 0650 12/29/12 1300 12/29/12 0250 12/28/12 1959 12/28/12 1730 12/28/12 0655 12/27/12 1050  CKTOTAL 25 -- -- -- 107 164 504*  CKMB -- -- -- -- -- -- --  CKMBINDEX -- -- -- -- -- -- --  TROPONINI -- <0.30 <0.30 <0.30 -- -- --   CBG:  Lab 01/02/13 2127 01/02/13 1819 01/02/13 1205 01/02/13 0759 01/01/13 2111  GLUCAP 103* 178* 132* 91 95    Recent Results (from the past 240 hour(s))  CULTURE, BLOOD (ROUTINE X 2)     Status: Normal   Collection Time   12/25/12  1:10 AM      Component Value Range Status Comment   Specimen Description BLOOD RIGHT ARM   Final    Special Requests BOTTLES DRAWN AEROBIC AND ANAEROBIC 10CC EA   Final    Culture  Setup Time 12/25/2012 04:54   Final    Culture NO GROWTH 5 DAYS   Final    Report Status 12/31/2012 FINAL   Final   CULTURE, BLOOD (ROUTINE X 2)     Status: Normal   Collection Time   12/25/12  1:25 AM      Component Value Range Status Comment   Specimen Description BLOOD RIGHT HAND   Final    Special Requests BOTTLES DRAWN AEROBIC ONLY 10CC   Final    Culture  Setup Time 12/25/2012 04:54   Final    Culture NO GROWTH 5 DAYS   Final    Report Status 12/31/2012 FINAL   Final   MRSA PCR SCREENING     Status: Normal   Collection Time   12/25/12  5:48 AM      Component Value Range Status Comment   MRSA by PCR NEGATIVE  NEGATIVE  Final   URINE CULTURE     Status: Normal   Collection Time   12/25/12  6:16 AM      Component Value Range Status Comment   Specimen Description URINE, CATHETERIZED   Final  Special Requests NONE   Final    Culture  Setup Time 12/25/2012 06:52   Final    Colony Count NO GROWTH   Final    Culture NO GROWTH   Final    Report Status 12/26/2012 FINAL   Final      Studies: No results found.  Scheduled Meds:   . acetaminophen  650 mg Oral Once  . calcium carbonate  400 mg of elemental calcium Oral TID WC  . ceFAZolin (ANCEF) IVPB - custom  1,000 mg Intravenous Q12H  . heparin subcutaneous  5,000 Units Subcutaneous Q8H  . insulin aspart  0-15 Units Subcutaneous TID WC  . insulin aspart  0-5 Units Subcutaneous QHS  . levothyroxine  150 mcg Oral QAC breakfast  . pantoprazole  40 mg Oral Q1200   Continuous Infusions:   . sodium chloride 20 mL/hr at 12/28/12 2229    Principal Problem:  *Acute-on-chronic respiratory failure Active Problems:  Morbid obesity with BMI of 70 and over, adult  Acute-on-chronic renal failure  Acute encephalopathy  Cellulitis  Hyponatremia  Hypovolemia  OSA (obstructive sleep apnea)  Obesity hypoventilation syndrome    Time spent: 25 min.    Conley Canal [facility] Pager [phone]. If 8PM-8AM, please contact night-coverage at [email or web address], password [facility] 01/03/2013, 8:15 AM  LOS: 9 days

## 2013-01-04 ENCOUNTER — Encounter (HOSPITAL_COMMUNITY): Payer: Self-pay | Admitting: General Practice

## 2013-01-04 DIAGNOSIS — E871 Hypo-osmolality and hyponatremia: Secondary | ICD-10-CM

## 2013-01-04 LAB — COMPREHENSIVE METABOLIC PANEL
ALT: <5 U/L (ref 0–35)
AST: 14 U/L (ref 0–37)
Albumin: 2.7 g/dL — ABNORMAL LOW (ref 3.5–5.2)
Alkaline Phosphatase: 51 U/L (ref 39–117)
BUN: 89 mg/dL — ABNORMAL HIGH (ref 6–23)
Chloride: 98 mEq/L (ref 96–112)
Potassium: 3.9 mEq/L (ref 3.5–5.1)
Sodium: 137 mEq/L (ref 135–145)
Total Bilirubin: 0.3 mg/dL (ref 0.3–1.2)
Total Protein: 7.4 g/dL (ref 6.0–8.3)

## 2013-01-04 LAB — URINALYSIS, ROUTINE W REFLEX MICROSCOPIC
Bilirubin Urine: NEGATIVE
Glucose, UA: NEGATIVE mg/dL
Ketones, ur: NEGATIVE mg/dL
Nitrite: NEGATIVE
Specific Gravity, Urine: 1.01 (ref 1.005–1.030)
pH: 5.5 (ref 5.0–8.0)

## 2013-01-04 LAB — URINE MICROSCOPIC-ADD ON

## 2013-01-04 MED ORDER — CLINDAMYCIN HCL 300 MG PO CAPS
600.0000 mg | ORAL_CAPSULE | Freq: Three times a day (TID) | ORAL | Status: DC
  Administered 2013-01-04 – 2013-01-06 (×6): 600 mg via ORAL
  Filled 2013-01-04 (×8): qty 2

## 2013-01-04 NOTE — Progress Notes (Signed)
Assisted pt. With setting up cpap. Pt. States she will place her mask on when she is ready. RT informed pt. To notify if she needs any assistance. RT will inform RN.

## 2013-01-04 NOTE — Progress Notes (Signed)
Patient has had PNA screening

## 2013-01-04 NOTE — Progress Notes (Signed)
Called and left a message about consult with inpatient  DM coordinator at [phone] and ma

## 2013-01-04 NOTE — Progress Notes (Signed)
Renal function continues to improve creatinine today down to 8.9 Creatinine is falling by 1 mg% per day [facility] for full recovery but she will simply need to be monitored for this (Not sure how far to expect creatinine to fall because last value we have PTA was in 2012 but was 1.0 at that time)  I would recommend watching her inpt until creatinine is down into the 5-6 range at least, and still showing a downward decline. She can followup at that point with her primary care (would recommend that they do a renal panel twice a week until creatinine reaches a baseline) She should not need renal followup unless does not have full functional recovery.  At this time, will sign off. Please call if I can answer questions.

## 2013-01-04 NOTE — Progress Notes (Signed)
[facility] PROGRESS NOTE  Alexandra Beck QMV:[id number] DOB: 14-Sep-1981 DOA: 12/25/2012 PCP: Geraldo Pitter, MD  Assessment/Plan: Acute on chronic renal failure  - Renal has signed off with specific instructions (please see Dr. Elza Rafter note 2/7) - Likely acute tubular necrosis  - multifactorial causes- cellulitis/infection, hypotension, diuretics, ARB's and rhabdomyolysis  - Creatinine has started to trend down (8.9). Renal requests 5.0 or less to d/c  - Urine output is good, no azotemia, no back pain.  - Diuretics discontinued. Continued on IVF to improve creatinine.  Acute on chronic respiratory failure  - in setting of OSA/OHS, CPAP non compliance  - Progressed from BiPAP to nocturnal CPAP inpatient. - Will need a mask for CPAP at discharge.  Left lower extremity cellulitis  -Scabbed over. Slow improvement. Appreciate wound care. -  erythema began the day of admission and very quickly developed into Sepsis - erythema has come down when compared to the traced areas, but she has developed significant blistering/bulla (2/1) covering her left calf.  - seen by ortho who recommended wound care  -Wound care RN consulted. We appreciate recommendations. Will order [facility] RN for dressing changes if needed at d/c - Dopplers of the left lower extremity done on 1/28-negative for DVT  - CT scan of the left leg than on 1/28-showed cellulitis without myofascitis or osteomyelitis or abscess  - Continue with Ancef   Acute encephalopathy  -Resolved.  -Secondary to Acute respiratory failure, acute renal failure   Rhabdomyolysis  - CK has normalized   Transaminitis  - Resolved.  - Likely secondary to shock liver and rhabdomyolysis   Pre  Diabetes  - Currently diet controlled inpatient  - Metformin currently contraindicated with renal failure  - Arrangements are being made for outpatient DM education at discharge. - A1c 6.2   Hypothyroid  - Continue with levothyroxine   Hypertension  -  Hold metoprolol, HYZAAR  -Moderate control on no meds. Allow moderate hypertension for kidney perfusion.  Dyslipidemia  - Originally held statins currently due to elevated LFTs.  - LFTs normalized.  - Add lipitor 2/6  Morbid obesity with BMI of 70 and over, adult  - Counseled extensively regarding the importance of weight loss  - Discussed possible out patient consultation with a bariatric surgeon   Disposition:  Remain inpatient until creatinine returns to a safe level (5's).  DVT Prophylaxis:  Start Prophylactic Heparin   Code Status:  Full code   Procedures:  None  CONSULTS:  Pulmonary/intensive care  Nephrology  Orthopedics  Wound Care    HPI/Subjective: No Complaints.  Excited her creatinine has declined to 8.9.  Discussed losing weight and exercising after discharge to improve her over all health.  Objective: Filed Vitals:   01/03/13 2020 01/03/13 2323 01/04/13 0656 01/04/13 1213  BP: 159/99  142/88   Pulse: 75 78 78   Temp: 98.3 F (36.8 C)  98.2 F (36.8 C)   TempSrc: Oral  Oral   Resp: 20 18 18    Height:      Weight:   177.4 kg (391 lb 1.5 oz) 172.594 kg (380 lb 8 oz)  SpO2: 96% 95%      Intake/Output Summary (Last 24 hours) at 01/04/13 1327 Last data filed at 01/04/13 1015  Gross per 24 hour  Intake 2083.75 ml  Output   1300 ml  Net 783.75 ml   Filed Weights   01/03/13 0400 01/04/13 0656 01/04/13 1213  Weight: 175.4 kg (386 lb 11 oz) 177.4 kg (391 lb  1.5 oz) 172.594 kg (380 lb 8 oz)    Exam:   General:  Awake, Alert, wearing cpap, Gives me a thumbs up.  Cardiovascular: S1, S2 no m/r/g  Respiratory: cta no w/c/r, no accessory muscle use  Abdomen: massive, non tender, soft, non distended, no appreciable mass  Extremities:  LLE with bandage over wound (clean and dry), right lower extremity slightly pink (chronic?).    Skin:  Dry on lower extremities.  Data Reviewed: Basic Metabolic Panel:  Lab 01/04/13 4098 01/03/13 0600 01/02/13  0610 01/01/13 0650 12/31/12 0700  NA 137 136 135 137 137  K 3.9 3.9 3.6 4.0 3.8  CL 98 95* 93* 95* 96  CO2 24 23 21 22 20   GLUCOSE 99 100* 91 92 116*  BUN 89* 95* 94* 96* 97*  CREATININE 8.94* 9.99* 10.72* 11.04* 11.27*  CALCIUM 9.1 9.3 9.4 9.3 8.9  MG -- -- -- -- --  PHOS 7.4* 7.5* 8.5* 8.9* --   Liver Function Tests:  Lab 01/04/13 0535 01/03/13 0600 01/02/13 0610 01/01/13 0650 12/31/12 0700  AST 14 16 18 26  37  ALT 5 6 31  140* 364*  ALKPHOS 51 55 55 57 55  BILITOT 0.3 0.2* 0.2* 0.3 0.4  PROT 7.4 7.6 7.3 7.2 6.8  ALBUMIN 2.7* 2.8* 2.7* 2.6* 2.5*   CBC:  Lab 01/01/13 0650 12/31/12 0700 12/30/12 0616 12/29/12 0250  WBC 8.2 8.2 7.6 7.5  NEUTROABS -- -- -- --  HGB 12.0 11.8* 11.7* 11.6*  HCT 34.8* 34.1* 35.0* 34.7*  MCV 84.3 84.0 85.2 85.7  PLT 219 197 181 166   Cardiac Enzymes:  Lab 01/01/13 0650 12/29/12 1300 12/29/12 0250 12/28/12 1959 12/28/12 1730  CKTOTAL 25 -- -- -- 107  CKMB -- -- -- -- --  CKMBINDEX -- -- -- -- --  TROPONINI -- <0.30 <0.30 <0.30 --   CBG:  Lab 01/03/13 1136 01/03/13 0809 01/02/13 2127 01/02/13 1819 01/02/13 1205  GLUCAP 103* 90 103* 178* 132*    No results found for this or any previous visit (from the past 240 hour(s)).   Studies: No results found.  Scheduled Meds:    . acetaminophen  650 mg Oral Once  . atorvastatin  10 mg Oral q1800  . calcium carbonate  400 mg of elemental calcium Oral TID WC  . ceFAZolin (ANCEF) IVPB - custom  2,000 mg Intravenous Q12H  . heparin subcutaneous  5,000 Units Subcutaneous Q8H  . levothyroxine  150 mcg Oral QAC breakfast  . pantoprazole  40 mg Oral Q1200   Continuous Infusions:    . sodium chloride 75 mL/hr at 01/04/13 1135    Principal Problem:  *Acute-on-chronic respiratory failure Active Problems:  Morbid obesity with BMI of 70 and over, adult  Acute-on-chronic renal failure  Acute encephalopathy  Cellulitis  Hyponatremia  Hypovolemia  OSA (obstructive sleep apnea)  Obesity  hypoventilation syndrome    Time spent: 25 min.    Conley Canal [facility] Pager [phone]. If 8PM-8AM, please contact night-coverage at [email or web address], password [facility] 01/04/2013, 1:27 PM  LOS: 10 days

## 2013-01-04 NOTE — Progress Notes (Signed)
2/7  Spoke with patient.  States that she has been told that she has prediabetes.  Explained the HgbA1C as 6.2% and the range for prediabetes.  States that she would be interested in going to [facility] in [place]....outpatient order entered.   Will need to be taken off Metformin due to her renal function.  Will need to follow up with PCP for medication management. Will continue to follow while in hospital.  Smith Mince RN BSN CDE

## 2013-01-04 NOTE — Progress Notes (Signed)
Dr. Radonna Ricker, Joana Reamer.  Was text  To inform him of IV team not able to restart IV and what did he want [place] to do about not having IV  Access. Informed Nurse that  MD was called Boneta Lucks)

## 2013-01-04 NOTE — Progress Notes (Signed)
Attempted left lower extremity venous duplex.  The patient states there is a lump on the front of her shin.  I explained ultrasound can not rule out DVT in bony areas.  I notified [place], Charity fundraiser.

## 2013-01-04 NOTE — Progress Notes (Signed)
Notified Elray Mcgregor, NP that IV team is not able to get IV access on patient. Patient on ancef IV. Antibiotic needs to be changed to po. Will continue to monitor patient. Nelda Marseille, RN

## 2013-01-04 NOTE — Progress Notes (Signed)
agree with above

## 2013-01-04 NOTE — Progress Notes (Signed)
-   I have reviewed her data and agree with above. - Continue IV fluids, creatinine continues to improve. Continue strict eyes and nose. - Check a basic metabolic panel in the morning. - We'll continue antibiotics as an outpatient.

## 2013-01-05 LAB — CBC
MCH: 29 pg (ref 26.0–34.0)
MCV: 87.7 fL (ref 78.0–100.0)
Platelets: 245 10*3/uL (ref 150–400)
RBC: 3.73 MIL/uL — ABNORMAL LOW (ref 3.87–5.11)
RDW: 14.8 % (ref 11.5–15.5)
WBC: 10 10*3/uL (ref 4.0–10.5)

## 2013-01-05 LAB — COMPREHENSIVE METABOLIC PANEL
AST: 15 U/L (ref 0–37)
Albumin: 2.7 g/dL — ABNORMAL LOW (ref 3.5–5.2)
Alkaline Phosphatase: 51 U/L (ref 39–117)
BUN: 85 mg/dL — ABNORMAL HIGH (ref 6–23)
CO2: 22 mEq/L (ref 19–32)
Chloride: 99 mEq/L (ref 96–112)
Creatinine, Ser: 7.86 mg/dL — ABNORMAL HIGH (ref 0.50–1.10)
GFR calc non Af Amer: 6 mL/min — ABNORMAL LOW (ref >90)
Potassium: 4 mEq/L (ref 3.5–5.1)
Total Bilirubin: 0.3 mg/dL (ref 0.3–1.2)

## 2013-01-05 NOTE — Progress Notes (Signed)
[facility] PROGRESS NOTE  Alexandra Beck WGN:[id number] DOB: 08/10/1981 DOA: 12/25/2012 PCP: Geraldo Pitter, MD  Assessment/Plan: Acute on chronic renal failure  - Likely acute tubular necrosis. - multifactorial causes- cellulitis/infection, hypotension, diuretics, ARB's and rhabdomyolysis  - Creatinine  Continues to improved. - Urine output is good.  - Continued on IVF to improve creatinine.  Acute on chronic respiratory failure  - in setting of OSA/OHS, CPAP non compliance  - Progressed from BiPAP to nocturnal CPAP inpatient. - Will need a mask for CPAP at discharge.  Left lower extremity cellulitis  -Scabbed over. Slow improvement. Appreciate wound care. -  erythema began the day of admission and very quickly developed into Sepsis - erythema has come down when compared to the traced areas, but she has developed significant blistering/bulla (2/1) covering her left calf.  - Wound care RN consulted. We appreciate recommendations. Will order [facility] RN for dressing changes if needed at d/c - Dopplers of the left lower extremity done on 1/28-negative for DVT  - CT scan of the left leg than on 1/28-showed cellulitis without myofascitis or osteomyelitis or abscess  - Continue with Ancef   Acute encephalopathy  -Resolved.  -Secondary to Acute respiratory failure, acute renal failure   Rhabdomyolysis  - CK has normalized   Transaminitis  - Resolved.  - Likely secondary to shock liver and rhabdomyolysis   Pre  Diabetes  - Currently diet controlled inpatient  - Metformin currently contraindicated with renal failure  - Arrangements are being made for outpatient DM education at discharge. - A1c 6.2   Hypothyroid  - Continue with levothyroxine   Hypertension  - Hold metoprolol, HYZAAR  -Moderate control on no meds. Allow moderate hypertension for kidney perfusion.  Dyslipidemia  - Originally held statins currently due to elevated LFTs.  - LFTs normalized.  - Add lipitor  2/6  Morbid obesity with BMI of 70 and over, adult  - Counseled extensively regarding the importance of weight loss  - Discussed possible out patient consultation with a bariatric surgeon   Disposition:  Remain inpatient until creatinine returns to a safe level (5's).  DVT Prophylaxis:  Start Prophylactic Heparin   Code Status:  Full code   Procedures:  None  CONSULTS:  Pulmonary/intensive care  Nephrology  Orthopedics  Wound Care    HPI/Subjective: No Complaints. Excited.  Objective: Filed Vitals:   01/04/13 2238 01/04/13 2247 01/04/13 2325 01/05/13 0533  BP:  151/99  151/88  Pulse: 77  82 85  Temp: 99.1 F (37.3 C)   98 F (36.7 C)  TempSrc: Oral   Oral  Resp: 20  20 20   Height:      Weight:    177.3 kg (390 lb 14 oz)  SpO2: 99%  98% 97%    Intake/Output Summary (Last 24 hours) at 01/05/13 0932 Last data filed at 01/05/13 0501  Gross per 24 hour  Intake    600 ml  Output   2600 ml  Net  -2000 ml   Filed Weights   01/04/13 0656 01/04/13 1213 01/05/13 0533  Weight: 177.4 kg (391 lb 1.5 oz) 172.594 kg (380 lb 8 oz) 177.3 kg (390 lb 14 oz)    Exam:   General:  Awake, Alert, wearing cpap, Gives me a thumbs up.  Cardiovascular: S1, S2 no m/r/g  Extremities:  LLE with bandage over wound (clean and dry), right lower extremity slightly pink (chronic?).    Skin:  Dry on lower extremities.  Data Reviewed: Basic Metabolic  Panel:  Recent Labs Lab 01/01/13 0650 01/02/13 0610 01/03/13 0600 01/04/13 0535 01/05/13 0602  NA 137 135 136 137 138  K 4.0 3.6 3.9 3.9 4.0  CL 95* 93* 95* 98 99  CO2 22 21 23 24 22   GLUCOSE 92 91 100* 99 95  BUN 96* 94* 95* 89* 85*  CREATININE 11.04* 10.72* 9.99* 8.94* 7.86*  CALCIUM 9.3 9.4 9.3 9.1 9.4  PHOS 8.9* 8.5* 7.5* 7.4*  --    Liver Function Tests:  Recent Labs Lab 01/01/13 0650 01/02/13 0610 01/03/13 0600 01/04/13 0535 01/05/13 0602  AST 26 18 16 14 15   ALT 140* 31 6 <5 <5  ALKPHOS 57 55 55 51 51   BILITOT 0.3 0.2* 0.2* 0.3 0.3  PROT 7.2 7.3 7.6 7.4 7.5  ALBUMIN 2.6* 2.7* 2.8* 2.7* 2.7*   CBC:  Recent Labs Lab 12/30/12 0616 12/31/12 0700 01/01/13 0650 01/05/13 0602  WBC 7.6 8.2 8.2 10.0  HGB 11.7* 11.8* 12.0 10.8*  HCT 35.0* 34.1* 34.8* 32.7*  MCV 85.2 84.0 84.3 87.7  PLT 181 197 219 245   Cardiac Enzymes:  Recent Labs Lab 12/29/12 1300 01/01/13 0650  CKTOTAL  --  25  TROPONINI <0.30  --    CBG:  Recent Labs Lab 01/02/13 1205 01/02/13 1819 01/02/13 2127 01/03/13 0809 01/03/13 1136  GLUCAP 132* 178* 103* 90 103*    No results found for this or any previous visit (from the past 240 hour(s)).   Studies: No results found.  Scheduled Meds: . acetaminophen  650 mg Oral Once  . atorvastatin  10 mg Oral q1800  . calcium carbonate  400 mg of elemental calcium Oral TID WC  . clindamycin  600 mg Oral Q8H  . heparin subcutaneous  5,000 Units Subcutaneous Q8H  . levothyroxine  150 mcg Oral QAC breakfast  . pantoprazole  40 mg Oral Q1200   Continuous Infusions: . sodium chloride 50 mL/hr at 01/04/13 1634    Principal Problem:   Acute-on-chronic respiratory failure Active Problems:   Cellulitis   Acute encephalopathy   Morbid obesity with BMI of 70 and over, adult   Acute-on-chronic renal failure   Hyponatremia   Hypovolemia   OSA (obstructive sleep apnea)   Obesity hypoventilation syndrome    Time spent: 25 min.    Marinda Elk, PA-C [facility] Pager [phone]. If 8PM-8AM, please contact night-coverage at [email or web address], password [facility] 01/05/2013, 9:32 AM  LOS: 11 days

## 2013-01-06 LAB — COMPREHENSIVE METABOLIC PANEL
BUN: 79 mg/dL — ABNORMAL HIGH (ref 6–23)
CO2: 19 mEq/L (ref 19–32)
Calcium: 9.2 mg/dL (ref 8.4–10.5)
Chloride: 97 mEq/L (ref 96–112)
Creatinine, Ser: 6.73 mg/dL — ABNORMAL HIGH (ref 0.50–1.10)
GFR calc Af Amer: 9 mL/min — ABNORMAL LOW (ref >90)
GFR calc non Af Amer: 7 mL/min — ABNORMAL LOW (ref >90)
Total Bilirubin: 0.3 mg/dL (ref 0.3–1.2)

## 2013-01-06 MED ORDER — CLINDAMYCIN HCL 300 MG PO CAPS: 600.0000 mg | ORAL_CAPSULE | Freq: Three times a day (TID) | ORAL | Status: DC

## 2013-01-06 NOTE — Progress Notes (Signed)
01/06/13 Patient to be discharged today home. Discharge instructions reviewed with patient. Care manager set-up Home[facility] for dressing change.

## 2013-01-06 NOTE — Progress Notes (Signed)
Physician Discharge Summary  Alexandra Beck:[id number] DOB: 1981/02/07 DOA: 12/25/2012  PCP: Geraldo Pitter, MD  Admit date: 12/25/2012 Discharge date: 01/06/2013  Time spent: 30 minutes  Recommendations for Outpatient Follow-up:  1. Hospital follow up, will need a b-met to follow up on her creatinine. Also check her BP and resume Bp medications once creatinine improved.  Discharge Diagnoses:  Principal Problem:   Acute-on-chronic respiratory failure Active Problems:   Cellulitis   Acute encephalopathy   Morbid obesity with BMI of 70 and over, adult   Acute-on-chronic renal failure   Hyponatremia   Hypovolemia   OSA (obstructive sleep apnea)   Obesity hypoventilation syndrome   Discharge Condition: stable  Diet recommendation: heart healthy diet  Filed Weights   01/04/13 1213 01/05/13 0533 01/06/13 0428  Weight: 172.594 kg (380 lb 8 oz) 177.3 kg (390 lb 14 oz) 177.4 kg (391 lb 1.5 oz)    History of present illness:  66 with morbid obesity, OSA/OHS (not on positive pressure therapy since one month ago due to machine malfunction), opioids and chronic respiratory failure who was brought to [facility] ED with altered mental statis and hypercarbia requiring BiPAP. Labs reveled acute renal failure. Of note she was on Losartan / HCTZ prior to admission and had decreased fluid intake for 24-48 h prior to coming to ED.   Hospital Course:  Left lower extremity cellulitis: - initially admitted to ICU, started on vanc and zosyn and rocephin. - erythema began the day of admission and very quickly developed into Sepsis  - cellulitis improved, but scabbed over. With slow improvement. Ortho consulted recommended wound care. - erythema has come down when compared to the traced areas, but she has developed significant blistering/bulla (2/1) covering her left calf.  - RN wound nurse to follow as an outpatient. - Dopplers of the left lower extremity done on 1/28-negative for DVT  - CT scan of the left  leg than on 1/28-showed cellulitis without myofascitis or osteomyelitis or abscess  - Continue with clindamycin as an out patient for a total of 14 days.  Acute on chronic respiratory failure/ sepsis: - in setting of OSA/OHS, CPAP non compliance  - Progressed from BiPAP to nocturnal CPAP inpatient.  - Will need a mask for CPAP at discharge.  - not requiring pressor. Improved and manage with IV fluids.  Acute on chronic renal failure  - Likely acute tubular necrosis.  - multifactorial causes- cellulitis/infection, hypotension, diuretics, ARB's and rhabdomyolysis  - Creatinine Continues to improved.  - Urine output is good.  - hold as an outpatient ACE/HCTZ and metformin. - follow up with PCP and restarting this once creatinine stable. - she will need to avoid NSAID's, I have explain this to her and risk and benefit's.   Rhabdomyolysis  - CK has normalized  - resolved with IV fluids.  Transaminitis  - Resolved.  - Likely secondary to shock liver and rhabdomyolysis   Pre Diabetes  - Currently diet controlled inpatient  - Metformin currently contraindicated with renal failure. Follow up with PCP and resume as an outpatient. - Arrangements are being made for outpatient DM education at discharge.  - HbgA1c 6.2. Patient required no insulin on her last 3 days in the hospital.  Hypothyroid  - Continue with levothyroxine   Hypertension  - resume metoprolol, hold HYZAAR until creatinine close to baseline. Will need follow up with PCP  Dyslipidemia  - Originally held statins currently due to elevated LFTs.  - LFTs normalized.  - Add  lipitor 2/6   Morbid obesity with BMI of 70 and over, adult  - Counseled extensively regarding the importance of weight loss  - Discussed possible out patient consultation with a bariatric surgeon   SIGNIFICANT EVENTS / STUDIES:  1/28 Admitted with acute on chronic respiratory failure requiring BiPAP and acute renal failure; placed on bipap; no IV  access, RIJ placed; worsening LLE cellulitis.  1/28 Elevated AST/ALT, ARF, per Ortho no compartment syndrome, CT shows cellulitis only, BLE dopplers neg.  1/29 Renal consulted. Given Lasix , improved uop.   Procedures: 1/28 Blood Cx x2 >>>  2.3.2014 no growth til date. 1/28 Urine Cx >> Negative  1/28 Hepatitis panel >> no acute hepatitis.  LINES / TUBES:  1/28 RIJ >> 2.3.2014 1/28 bipap, intermittent   Consultations:  PCCM  renal  Discharge Exam: Filed Vitals:   01/04/13 2325 01/05/13 0533 01/05/13 2034 01/06/13 0428  BP:  151/88 159/98 143/77  Pulse: 82 85 80 86  Temp:  98 F (36.7 C) 98.2 F (36.8 C) 99.3 F (37.4 C)  TempSrc:  Oral Oral Axillary  Resp: 20 20 18 20   Height:      Weight:  177.3 kg (390 lb 14 oz)  177.4 kg (391 lb 1.5 oz)  SpO2: 98% 97% 95% 97%    General: A&O x3  Cardiovascular: RRR Respiratory: good air movement CTA B/L  Discharge Instructions  Discharge Orders   Future Orders Complete By Expires     Ambulatory referral to Nutrition and Diabetic Education  As directed     Comments:      Patient with prediabetes HgbA1C is 6.2%.    Diet - low sodium heart healthy  As directed     Increase activity slowly  As directed         Medication List    STOP taking these medications       losartan-hydrochlorothiazide 100-25 MG per tablet  Commonly known as:  HYZAAR     metFORMIN 500 MG tablet  Commonly known as:  GLUCOPHAGE      TAKE these medications       clindamycin 300 MG capsule  Commonly known as:  CLEOCIN  Take 2 capsules (600 mg total) by mouth 3 (three) times daily.     levothyroxine 150 MCG tablet  Commonly known as:  SYNTHROID, LEVOTHROID  Take 150 mcg by mouth daily.     metoprolol succinate 100 MG 24 hr tablet  Commonly known as:  TOPROL-XL  Take 100 mg by mouth daily. Take with or immediately following a meal.     oxyCODONE-acetaminophen 5-325 MG per tablet  Commonly known as:  PERCOCET/ROXICET  Take 1-2 tablets by  mouth every 4 (four) hours as needed. For pain     promethazine 25 MG tablet  Commonly known as:  PHENERGAN  Take 25 mg by mouth every 6 (six) hours as needed. For nausea     rosuvastatin 10 MG tablet  Commonly known as:  CRESTOR  Take 10 mg by mouth daily.           Follow-up Information   Follow up with Geraldo Pitter, MD In 1 week. (hospital follow up)    Contact information:   [address] [place] [address] [phone]       The results of significant diagnostics from this hospitalization (including imaging, microbiology, ancillary and laboratory) are listed below for reference.    Significant Diagnostic Studies: Ct Femur Left Wo Contrast  12/25/2012  *  RADIOLOGY REPORT*  Clinical Data: Pain and swelling.  CT OF THE LEFT FEMUR WITHOUT CONTRAST,CT TIBIA FIBULA LEFT WITHOUT CONTRAST  Comparison: None.  Findings: Mild diffuse cellulitis involving the left lower extremity most notably in the mid distal tibia / fibula region.  No discrete soft tissue abscess or findings for myofasciitis.  The bony structures are intact.  No findings to suggest septic arthritis or osteomyelitis.  Slightly enlarged / inflamed left inguinal lymph nodes are noted.  IMPRESSION: Cellulitis without findings for focal soft tissue abscess, myofasciitis, septic arthritis or osteomyelitis.   Original Report Authenticated By: Rudie Meyer, M.D.    Ct Tibia Fibula Left Wo Contrast  12/25/2012  *RADIOLOGY REPORT*  Clinical Data: Pain and swelling.  CT OF THE LEFT FEMUR WITHOUT CONTRAST,CT TIBIA FIBULA LEFT WITHOUT CONTRAST  Comparison: None.  Findings: Mild diffuse cellulitis involving the left lower extremity most notably in the mid distal tibia / fibula region.  No discrete soft tissue abscess or findings for myofasciitis.  The bony structures are intact.  No findings to suggest septic arthritis or osteomyelitis.  Slightly enlarged / inflamed left inguinal lymph nodes are noted.  IMPRESSION: Cellulitis  without findings for focal soft tissue abscess, myofasciitis, septic arthritis or osteomyelitis.   Original Report Authenticated By: Rudie Meyer, M.D.    US Renal Port  12/25/2012  *RADIOLOGY REPORT*  Clinical Data: Worsening renal function.  RENAL/URINARY TRACT ULTRASOUND COMPLETE  Comparison:  MRI 12/28/2005  Findings:  Right Kidney:  12.0 cm. Normal size and echotexture.  No focal abnormality.  No hydronephrosis.  Left Kidney:  12.8 cm. Normal size and echotexture.  No focal abnormality.  No hydronephrosis.  Bladder:  Not visualized.  IMPRESSION: No acute renal abnormality.  No hydronephrosis.  Nonvisualization of the bladder.   Original Report Authenticated By: Charlett Nose, M.D.    Dg Chest Port 1 View  12/27/2012  *RADIOLOGY REPORT*  Clinical Data: Respiratory failure.  Shortness of breath.  PORTABLE CHEST - 1 VIEW  Comparison: Chest 12/26/2012 and 12/25/2012.  PA and lateral chest 09/16/2011.  Findings: Right IJ catheter remains in place.  There is cardiomegaly and mild vascular congestion.  No consolidative process, pneumothorax or effusion.  IMPRESSION: Cardiomegaly and mild vascular congestion.   Original Report Authenticated By: Holley Dexter, M.D.    Dg Chest Port 1 View  12/26/2012  *RADIOLOGY REPORT*  Clinical Data: Shortness of breath.  Respiratory failure.  PORTABLE CHEST - 1 VIEW  Comparison: 12/25/2012  Findings: Central line tip is in the superior vena cava in good position.  There is new slight pulmonary vascular congestion.  No consolidative infiltrates or effusions.  The heart size is normal.  IMPRESSION: New pulmonary vascular congestion.   Original Report Authenticated By: Francene Boyers, M.D.    Dg Chest Port 1 View  12/25/2012  *RADIOLOGY REPORT*  Clinical Data: Central line placement  PORTABLE CHEST - 1 VIEW  Comparison: Prior chest x-ray performed earlier today, 12/25/2012 at 01:23 a.m.  Findings: Interval placement of a right IJ central venous catheter. The tip projects  over the superior cavoatrial junction.  No evidence of complicating pneumothorax.  Inspiratory volumes are low and there is increased bibasilar atelectasis.  Stable cardiomegaly. No acute osseous abnormality.  Mild pulmonary vascular congestion.  IMPRESSION:  1.  Interval placement of a right IJ central venous catheter without evidence of complicating pneumothorax.  The tip of the catheter projects over the superior cavoatrial junction. 2.  Lower inspiratory volumes with increased bibasilar atelectasis 3.  Mild  pulmonary vascular congestion   Original Report Authenticated By: Malachy Moan, M.D.    Dg Chest Port 1 View  12/25/2012  *RADIOLOGY REPORT*  Clinical Data: Hypoxia, shortness of breath.  PORTABLE CHEST - 1 VIEW  Comparison: 09/16/2011  Findings: Hypoaeration.  Heart size upper normal to mildly enlarged.  Mild bibasilar opacities.  No definite pleural effusion or pneumothorax.  No acute osseous finding.  IMPRESSION: Hypoaeration with mild bibasilar opacities; atelectasis versus early infiltrate.   Original Report Authenticated By: Jearld Lesch, M.D.     Microbiology: No results found for this or any previous visit (from the past 240 hour(s)).   Labs: Basic Metabolic Panel:  Recent Labs Lab 01/01/13 0650 01/02/13 0610 01/03/13 0600 01/04/13 0535 01/05/13 0602 01/06/13 0445  NA 137 135 136 137 138 133*  K 4.0 3.6 3.9 3.9 4.0 4.8  CL 95* 93* 95* 98 99 97  CO2 22 21 23 24 22 19   GLUCOSE 92 91 100* 99 95 97  BUN 96* 94* 95* 89* 85* 79*  CREATININE 11.04* 10.72* 9.99* 8.94* 7.86* 6.73*  CALCIUM 9.3 9.4 9.3 9.1 9.4 9.2  PHOS 8.9* 8.5* 7.5* 7.4*  --   --    Liver Function Tests:  Recent Labs Lab 01/02/13 0610 01/03/13 0600 01/04/13 0535 01/05/13 0602 01/06/13 0445  AST 18 16 14 15 16   ALT 31 6 <5 <5 <5  ALKPHOS 55 55 51 51 48  BILITOT 0.2* 0.2* 0.3 0.3 0.3  PROT 7.3 7.6 7.4 7.5 7.7  ALBUMIN 2.7* 2.8* 2.7* 2.7* 2.6*   No results found for this basename: LIPASE,  AMYLASE,  in the last 168 hours No results found for this basename: AMMONIA,  in the last 168 hours CBC:  Recent Labs Lab 12/31/12 0700 01/01/13 0650 01/05/13 0602  WBC 8.2 8.2 10.0  HGB 11.8* 12.0 10.8*  HCT 34.1* 34.8* 32.7*  MCV 84.0 84.3 87.7  PLT 197 219 245   Cardiac Enzymes:  Recent Labs Lab 01/01/13 0650  CKTOTAL 25   BNP: BNP (last 3 results) No results found for this basename: PROBNP,  in the last 8760 hours CBG:  Recent Labs Lab 01/02/13 1205 01/02/13 1819 01/02/13 2127 01/03/13 0809 01/03/13 1136  GLUCAP 132* 178* 103* 90 103*       Signed:  FELIZ ORTIZ, ABRAHAM  [facility] 01/06/2013, 8:24 AM

## 2013-01-06 NOTE — Progress Notes (Signed)
NCM spoke to pt and states she does not want HH PT/OT for home. HH RN orders faxed to [facility] for scheduled d/c home today. Will lab draw and dressing changes needed. Faxed notification to [facility] of pt dc home today. [facility] contact info added to dc instructions. Isidoro Donning RN CCM Case Mgmt phone [phone]

## 2013-01-07 ENCOUNTER — Encounter (HOSPITAL_COMMUNITY): Payer: Self-pay | Admitting: Emergency Medicine

## 2013-01-07 ENCOUNTER — Inpatient Hospital Stay (HOSPITAL_COMMUNITY)
Admission: EM | Admit: 2013-01-07 | Discharge: 2013-01-12 | Disposition: A | Discharge status: Home Health | Payer: BC Managed Care – PPO | Source: Home / Self Care | Attending: Internal Medicine | Admitting: Internal Medicine

## 2013-01-07 DIAGNOSIS — I428 Other cardiomyopathies: Secondary | ICD-10-CM

## 2013-01-07 DIAGNOSIS — E871 Hypo-osmolality and hyponatremia: Secondary | ICD-10-CM

## 2013-01-07 DIAGNOSIS — I509 Heart failure, unspecified: Secondary | ICD-10-CM

## 2013-01-07 DIAGNOSIS — D631 Anemia in chronic kidney disease: Secondary | ICD-10-CM | POA: Diagnosis present

## 2013-01-07 DIAGNOSIS — Z6841 Body Mass Index (BMI) 40.0 and over, adult: Secondary | ICD-10-CM

## 2013-01-07 DIAGNOSIS — E662 Morbid (severe) obesity with alveolar hypoventilation: Secondary | ICD-10-CM | POA: Diagnosis present

## 2013-01-07 DIAGNOSIS — Z888 Allergy status to other drugs, medicaments and biological substances status: Secondary | ICD-10-CM

## 2013-01-07 DIAGNOSIS — N170 Acute kidney failure with tubular necrosis: Secondary | ICD-10-CM | POA: Diagnosis present

## 2013-01-07 DIAGNOSIS — Z792 Long term (current) use of antibiotics: Secondary | ICD-10-CM

## 2013-01-07 DIAGNOSIS — N189 Chronic kidney disease, unspecified: Secondary | ICD-10-CM | POA: Diagnosis present

## 2013-01-07 DIAGNOSIS — G9340 Encephalopathy, unspecified: Secondary | ICD-10-CM

## 2013-01-07 DIAGNOSIS — I152 Hypertension secondary to endocrine disorders: Secondary | ICD-10-CM | POA: Diagnosis present

## 2013-01-07 DIAGNOSIS — I890 Lymphedema, not elsewhere classified: Secondary | ICD-10-CM | POA: Diagnosis present

## 2013-01-07 DIAGNOSIS — R51 Headache: Secondary | ICD-10-CM

## 2013-01-07 DIAGNOSIS — N179 Acute kidney failure, unspecified: Secondary | ICD-10-CM

## 2013-01-07 DIAGNOSIS — I776 Arteritis, unspecified: Secondary | ICD-10-CM

## 2013-01-07 DIAGNOSIS — I110 Hypertensive heart disease with heart failure: Secondary | ICD-10-CM

## 2013-01-07 DIAGNOSIS — L03115 Cellulitis of right lower limb: Secondary | ICD-10-CM

## 2013-01-07 DIAGNOSIS — G4733 Obstructive sleep apnea (adult) (pediatric): Secondary | ICD-10-CM | POA: Diagnosis present

## 2013-01-07 DIAGNOSIS — J9620 Acute and chronic respiratory failure, unspecified whether with hypoxia or hypercapnia: Secondary | ICD-10-CM

## 2013-01-07 DIAGNOSIS — K219 Gastro-esophageal reflux disease without esophagitis: Secondary | ICD-10-CM | POA: Diagnosis present

## 2013-01-07 DIAGNOSIS — I129 Hypertensive chronic kidney disease with stage 1 through stage 4 chronic kidney disease, or unspecified chronic kidney disease: Secondary | ICD-10-CM | POA: Diagnosis present

## 2013-01-07 DIAGNOSIS — E119 Type 2 diabetes mellitus without complications: Secondary | ICD-10-CM | POA: Diagnosis present

## 2013-01-07 DIAGNOSIS — E861 Hypovolemia: Secondary | ICD-10-CM

## 2013-01-07 DIAGNOSIS — N039 Chronic nephritic syndrome with unspecified morphologic changes: Secondary | ICD-10-CM | POA: Diagnosis present

## 2013-01-07 DIAGNOSIS — M6282 Rhabdomyolysis: Secondary | ICD-10-CM | POA: Diagnosis present

## 2013-01-07 DIAGNOSIS — M7989 Other specified soft tissue disorders: Secondary | ICD-10-CM

## 2013-01-07 DIAGNOSIS — L02419 Cutaneous abscess of limb, unspecified: Secondary | ICD-10-CM | POA: Diagnosis present

## 2013-01-07 DIAGNOSIS — E039 Hypothyroidism, unspecified: Secondary | ICD-10-CM | POA: Diagnosis present

## 2013-01-07 DIAGNOSIS — E785 Hyperlipidemia, unspecified: Secondary | ICD-10-CM | POA: Diagnosis present

## 2013-01-07 DIAGNOSIS — D649 Anemia, unspecified: Secondary | ICD-10-CM | POA: Diagnosis present

## 2013-01-07 DIAGNOSIS — I10 Essential (primary) hypertension: Secondary | ICD-10-CM

## 2013-01-07 DIAGNOSIS — L97209 Non-pressure chronic ulcer of unspecified calf with unspecified severity: Secondary | ICD-10-CM | POA: Diagnosis present

## 2013-01-07 DIAGNOSIS — L0390 Cellulitis, unspecified: Secondary | ICD-10-CM

## 2013-01-07 DIAGNOSIS — Z79899 Other long term (current) drug therapy: Secondary | ICD-10-CM

## 2013-01-07 DIAGNOSIS — L03119 Cellulitis of unspecified part of limb: Secondary | ICD-10-CM | POA: Diagnosis present

## 2013-01-07 DIAGNOSIS — Z886 Allergy status to analgesic agent status: Secondary | ICD-10-CM

## 2013-01-07 HISTORY — DX: Sepsis, unspecified organism: A41.9

## 2013-01-07 HISTORY — DX: Hypothyroidism, unspecified: E03.9

## 2013-01-07 LAB — COMPREHENSIVE METABOLIC PANEL
ALT: <5 U/L (ref 0–35)
AST: 16 U/L (ref 0–37)
Alkaline Phosphatase: 57 U/L (ref 39–117)
CO2: 24 mEq/L (ref 19–32)
Chloride: 100 mEq/L (ref 96–112)
GFR calc non Af Amer: 11 mL/min — ABNORMAL LOW (ref >90)
Sodium: 138 mEq/L (ref 135–145)
Total Bilirubin: 0.5 mg/dL (ref 0.3–1.2)

## 2013-01-07 LAB — CBC WITH DIFFERENTIAL/PLATELET
Basophils Absolute: 0.1 10*3/uL (ref 0.0–0.1)
HCT: 34.4 % — ABNORMAL LOW (ref 36.0–46.0)
Lymphocytes Relative: 8 % — ABNORMAL LOW (ref 12–46)
Monocytes Absolute: 1.2 10*3/uL — ABNORMAL HIGH (ref 0.1–1.0)
Neutro Abs: 12.1 10*3/uL — ABNORMAL HIGH (ref 1.7–7.7)
RDW: 14.7 % (ref 11.5–15.5)
WBC: 14.6 10*3/uL — ABNORMAL HIGH (ref 4.0–10.5)

## 2013-01-07 MED ORDER — ATORVASTATIN CALCIUM 20 MG PO TABS
20.0000 mg | ORAL_TABLET | Freq: Every day | ORAL | Status: DC
  Administered 2013-01-08 – 2013-01-11 (×4): 20 mg via ORAL
  Filled 2013-01-07 (×5): qty 1

## 2013-01-07 MED ORDER — VANCOMYCIN HCL 10 G IV SOLR
1500.0000 mg | Freq: Once | INTRAVENOUS | Status: AC
  Administered 2013-01-07: 1500 mg via INTRAVENOUS
  Filled 2013-01-07: qty 1500

## 2013-01-07 MED ORDER — SODIUM CHLORIDE 0.9 % IV BOLUS (SEPSIS)
500.0000 mL | Freq: Once | INTRAVENOUS | Status: AC
  Administered 2013-01-07: 500 mL via INTRAVENOUS

## 2013-01-07 MED ORDER — SODIUM CHLORIDE 0.9 % IV SOLN
INTRAVENOUS | Status: AC
  Administered 2013-01-07: 75 mL/h via INTRAVENOUS

## 2013-01-07 MED ORDER — LEVOTHYROXINE SODIUM 150 MCG PO TABS
150.0000 ug | ORAL_TABLET | Freq: Every day | ORAL | Status: DC
  Administered 2013-01-08 – 2013-01-12 (×5): 150 ug via ORAL
  Filled 2013-01-07 (×6): qty 1

## 2013-01-07 MED ORDER — ACETAMINOPHEN 650 MG RE SUPP: 650.0000 mg | Freq: Four times a day (QID) | RECTAL | Status: DC | PRN

## 2013-01-07 MED ORDER — ENOXAPARIN SODIUM 40 MG/0.4ML ~~LOC~~ SOLN
40.0000 mg | SUBCUTANEOUS | Status: DC
  Administered 2013-01-08 – 2013-01-11 (×4): 40 mg via SUBCUTANEOUS
  Filled 2013-01-07 (×5): qty 0.4

## 2013-01-07 MED ORDER — ONDANSETRON HCL 4 MG/2ML IJ SOLN: 4.0000 mg | Freq: Four times a day (QID) | INTRAMUSCULAR | Status: DC | PRN

## 2013-01-07 MED ORDER — ONDANSETRON HCL 4 MG PO TABS: 4.0000 mg | ORAL_TABLET | Freq: Four times a day (QID) | ORAL | Status: DC | PRN

## 2013-01-07 MED ORDER — ACETAMINOPHEN 325 MG PO TABS: 650.0000 mg | ORAL_TABLET | Freq: Four times a day (QID) | ORAL | Status: DC | PRN

## 2013-01-07 MED ORDER — METOPROLOL TARTRATE 50 MG PO TABS
50.0000 mg | ORAL_TABLET | Freq: Two times a day (BID) | ORAL | Status: DC
  Administered 2013-01-08 – 2013-01-10 (×5): 50 mg via ORAL
  Filled 2013-01-07 (×8): qty 1

## 2013-01-07 MED ORDER — SODIUM CHLORIDE 0.9 % IV SOLN: INTRAVENOUS | Status: DC

## 2013-01-07 MED ORDER — MORPHINE SULFATE 4 MG/ML IJ SOLN
4.0000 mg | Freq: Once | INTRAMUSCULAR | Status: AC
  Administered 2013-01-07: 4 mg via INTRAVENOUS
  Filled 2013-01-07: qty 1

## 2013-01-07 MED ORDER — OXYCODONE-ACETAMINOPHEN 5-325 MG PO TABS
1.0000 | ORAL_TABLET | ORAL | Status: DC | PRN
  Administered 2013-01-08: 1 via ORAL
  Filled 2013-01-07: qty 1

## 2013-01-07 NOTE — ED Notes (Signed)
Admitting MD at bedside.

## 2013-01-07 NOTE — ED Notes (Signed)
Was just d/c yesterday  From hospital for cellulitis  Of left leg and other issues , states she told them she had this spot  on  Rt leg back of rt calf there is a knot and it hurts to walk but they did see anything she said

## 2013-01-07 NOTE — ED Notes (Signed)
Report called to [place], RN on 5500.  Preparing pt for transport.

## 2013-01-07 NOTE — ED Provider Notes (Signed)
History  This chart was scribed for Alexandra Racer, MD by Alexandra Beck, ED Scribe. This patient was seen in room A04C/A04C and the patient's care was started at 6:29 PM.  CSN: [id number]  Arrival date & time 01/07/13  1550   First MD Initiated Contact with Patient 01/07/13 1829      No chief complaint on file.    Patient is a 32 y.o. female presenting with leg pain. The history is provided by the patient. No language interpreter was used.  Leg Pain Location:  Leg Time since incident:  3 days Injury: no   Leg location:  R lower leg Pain details:    Quality:  Throbbing   Radiates to:  Does not radiate   Onset quality:  Gradual   Timing:  Constant   Progression:  Worsening Chronicity:  New Dislocation: no   Relieved by:  Rest Worsened by:  Bearing weight Associated symptoms: no fever     Alexandra Beck is a 32 y.o. female who presents to the Emergency Department complaining of 3 days of gradual onset, gradually worsening, constant right leg pain with an associated erythema and edema. She reports that her pain is aggravated by standing and walking. She states that she was admitted for acute chronic renal failure, respiratory distress and for left leg cellulitis. She reports that her 11.74 creatine upon admission was and reports that it came down to 6 yesterday. She was discharged yesterday afternoon and is currently on clindamycin. She denies missing any doses. She states that she voiced her concerns about cellulitis on the right lower leg but reports that the doctor stated he didn't see signs of cellulitis. She denies any recent fevers, chills, nausea and emesis as associated symptoms. She has a h/o HTN, CHF and HLD and denies smoking and alcohol use.  Past Medical History  Diagnosis Date  . Hypertension   . Apnea   . CHF (congestive heart failure)   . Morbid obesity   . Hyperlipidemia     Per patient  . Prediabetes     Per patient  . Sleep apnea   . Chronic kidney  disease     acute on chronic renal  . GERD (gastroesophageal reflux disease)   . Cellulitis     Past Surgical History  Procedure Laterality Date  . Wisdom tooth extraction      No family history on file.  History  Substance Use Topics  . Smoking status: Never Smoker   . Smokeless tobacco: Never Used  . Alcohol Use: No    No OB history provided.  Review of Systems  Constitutional: Negative for fever and chills.  Gastrointestinal: Negative for nausea and vomiting.  Skin: Positive for color change (right lower leg).  All other systems reviewed and are negative.    Allergies  Aspirin; Doxycycline; and Niaspan  Home Medications   Current Outpatient Rx  Name  Route  Sig  Dispense  Refill  . clindamycin (CLEOCIN) 300 MG capsule   Oral   Take 2 capsules (600 mg total) by mouth 3 (three) times daily.   12 capsule   0   . levothyroxine (SYNTHROID, LEVOTHROID) 150 MCG tablet   Oral   Take 150 mcg by mouth daily.         . metoprolol succinate (TOPROL-XL) 100 MG 24 hr tablet   Oral   Take 100 mg by mouth daily. Take with or immediately following a meal.         .  oxyCODONE-acetaminophen (PERCOCET/ROXICET) 5-325 MG per tablet   Oral   Take 1-2 tablets by mouth every 4 (four) hours as needed. For pain         . promethazine (PHENERGAN) 25 MG tablet   Oral   Take 25 mg by mouth every 6 (six) hours as needed. For nausea         . rosuvastatin (CRESTOR) 10 MG tablet   Oral   Take 10 mg by mouth daily.           Triage Vitals: BP 155/92  Pulse 112  Temp(Src) 98.8 F (37.1 C) (Oral)  Resp 22  SpO2 95%  LMP 12/03/2012  Physical Exam  Nursing note and vitals reviewed. Constitutional: She is oriented to person, place, and time. She appears well-developed and well-nourished. No distress.  HENT:  Head: Normocephalic and atraumatic.  Eyes: Conjunctivae and EOM are normal.  Neck: Neck supple. No tracheal deviation present.  Cardiovascular: Normal rate  and regular rhythm.   Pulmonary/Chest: Effort normal and breath sounds normal. No respiratory distress.  Abdominal: Soft. There is no tenderness.  Obese abdomen  Musculoskeletal: Normal range of motion.  Neurological: She is alert and oriented to person, place, and time.  Skin: Skin is warm and dry. There is erythema.   Posterior right calf is erythematous, warm to the touch, indurated, and tender to palpation, good distal pulses, neurotic tissue with surrounding mild erythema to the left posterior calf, appears to be well-healing, bilateral legs are neurovascularly intact  Psychiatric: She has a normal mood and affect. Her behavior is normal.    ED Course  Procedures (including critical care time)  DIAGNOSTIC STUDIES: Oxygen Saturation is 95% on room air, adequate by my interpretation.    COORDINATION OF CARE: 6:53 PM-Discussed treatment plan which includes admission for IV antibiotics with pt at bedside and pt agreed to plan.   7:00 PM- Ordered 500 mL of bolus  7:30 PM- Ordered 1,500 mg of vancocin IVPB  9:23 PM-Consult complete with Alexandra Beck. Patient case explained and discussed. Alexandra Beck agrees to admit patient for further evaluation and treatment. Call ended at 9:24 PM.  Labs Reviewed  CBC WITH DIFFERENTIAL - Abnormal; Notable for the following:    WBC 14.6 (*)    Hemoglobin 11.2 (*)    HCT 34.4 (*)    Neutrophils Relative 83 (*)    Neutro Abs 12.1 (*)    Lymphocytes Relative 8 (*)    Monocytes Absolute 1.2 (*)    All other components within normal limits  COMPREHENSIVE METABOLIC PANEL - Abnormal; Notable for the following:    BUN 61 (*)    Creatinine, Ser 4.91 (*)    Total Protein 8.8 (*)    Albumin 3.1 (*)    GFR calc non Af Amer 11 (*)    GFR calc Af Amer 12 (*)    All other components within normal limits  PROTIME-INR  APTT   No results found.   1. Cellulitis   2. Cellulitis of right leg       MDM  I personally performed the services described in this  documentation, which was scribed in my presence. The recorded information has been reviewed and is accurate.    Alexandra Racer, MD 01/07/13 [phone]

## 2013-01-07 NOTE — Progress Notes (Signed)
ANTIBIOTIC CONSULT NOTE - INITIAL  Pharmacy Consult for Vancomycin and Zosyn  Indication: cellulitis  Allergies  Allergen Reactions  . Aspirin     REACTION: throat swelling, hives  . Doxycycline Other (See Comments)    Abdominal pain  . Niaspan (Niacin Er)     Caused flushing    Patient Measurements: Height: 5' 1.81" (157 cm) Weight: 390 lb 3.4 oz (177 kg) IBW/kg (Calculated) : 49.67 Adjusted Body Weight: 100 kg  Vital Signs: Temp: 99.7 F (37.6 C) (02/10 2321) Temp src: Axillary (02/10 2321) BP: 170/95 mmHg (02/10 2300) Pulse Rate: 87 (02/10 2300)  Labs:  Recent Labs  01/05/13 0602 01/06/13 0445 01/07/13 1958  WBC 10.0  --  14.6*  HGB 10.8*  --  11.2*  PLT 245  --  260  CREATININE 7.86* 6.73* 4.91*   Estimated Creatinine Clearance: 26.1 ml/min (by C-G formula based on Cr of 4.91). No results found for this basename: VANCOTROUGH, VANCOPEAK, VANCORANDOM, GENTTROUGH, GENTPEAK, GENTRANDOM, TOBRATROUGH, TOBRAPEAK, TOBRARND, AMIKACINPEAK, AMIKACINTROU, AMIKACIN,  in the last 72 hours   Microbiology: No results found for this or any previous visit (from the past 720 hour(s)).  Medical History: Past Medical History  Diagnosis Date  . Hypertension   . Apnea   . CHF (congestive heart failure)   . Morbid obesity   . Hyperlipidemia     Per patient  . Prediabetes     Per patient  . Sleep apnea   . Chronic kidney disease     acute on chronic renal  . GERD (gastroesophageal reflux disease)   . Cellulitis   . Sepsis     Secondary to cellulitis  . Hypothyroidism   . Hypertension     Medications:  Prescriptions prior to admission  Medication Sig Dispense Refill  . clindamycin (CLEOCIN) 300 MG capsule Take 2 capsules (600 mg total) by mouth 3 (three) times daily.  12 capsule  0  . levothyroxine (SYNTHROID, LEVOTHROID) 150 MCG tablet Take 150 mcg by mouth daily.      . metoprolol (LOPRESSOR) 50 MG tablet Take 50 mg by mouth 2 (two) times daily.      Marland Kitchen  oxyCODONE-acetaminophen (PERCOCET/ROXICET) 5-325 MG per tablet Take 1-2 tablets by mouth every 4 (four) hours as needed. For pain      . promethazine (PHENERGAN) 25 MG tablet Take 25 mg by mouth every 6 (six) hours as needed. For nausea      . rosuvastatin (CRESTOR) 10 MG tablet Take 10 mg by mouth daily.       Assessment: 32 yo female with cellulitis for empiric antibiotics.  Vancomycin 1.5 g IV given in ED at 2000  Goal of Therapy:  Vancomycin trough level 10-15 mcg/ml  Plan:  Vancomycin 2 g IV now (for total of 2500 mg tonight) then 2 g IV q48h Zosyn 3.375 g IV q8h  Eddie Candle 01/07/2013,11:53 PM

## 2013-01-07 NOTE — Progress Notes (Signed)
*  PRELIMINARY RESULTS* Vascular Ultrasound Right lower extremity venous duplex has been completed.  Preliminary findings: Right:  Technically difficult study. No obvious evidence of DVT or Baker's cyst.   Farrel Demark, RDMS, RVT  01/07/2013, 7:22 PM

## 2013-01-07 NOTE — ED Notes (Signed)
Paged IV team for IV start.

## 2013-01-07 NOTE — H&P (Signed)
[facility] History and Physical  [name]:[id number] DOB: 05/16/1981 DOA: 01/07/2013  Referring physician: Dr. Ranae Palms. PCP: Geraldo Pitter, MD   Chief Complaint: Worsening skin infection.   History of Present Illness: Alexandra Beck is an 32 y.o. female with cellulitis x 2 recently hospitalized with 3rd episode (hospitalized 12/25/2012-01/06/2013). Patient's hospital course was complicated by the development of severe sepsis with acute encephalopathy, acute on chronic renal failure, and acute on chronic respiratory failure. She was initially treated with vancomycin, Zosyn, and Rocephin.  She was sent home on a 14 day course of clindamycin. She now returns with worsening pain and erythema involving the right lower extremity. She also reports some low-grade fevers.  Review of Systems: Constitutional: + low grade fever, no chills;  Appetite normal; No weight loss, no weight gain.  HEENT: No blurry vision, no diplopia, no pharyngitis, no dysphagia CV: No chest pain, no palpitations.  Resp: No SOB, no cough. GI: No nausea, no vomiting, no diarrhea, no melena, no hematochezia.  GU: No dysuria, no hematuria.  MSK: no myalgias, no arthralgias.  Neuro:  No headache, no focal neurological deficits, no history of seizures.  Psych: No depression, no anxiety.  Endo: No thyroid disease, no DM, no heat intolerance, no cold intolerance, no polyuria, no polydipsia  Skin: + skin infection BLE.  Heme: No easy bruising, no history of blood diseases.  Past Medical History Past Medical History  Diagnosis Date  . Hypertension   . Apnea   . CHF (congestive heart failure)   . Morbid obesity   . Hyperlipidemia     Per patient  . Prediabetes     Per patient  . Sleep apnea   . Chronic kidney disease     acute on chronic renal  . GERD (gastroesophageal reflux disease)   . Cellulitis   . Sepsis     Secondary to cellulitis  . Hypothyroidism   . Hypertension      Past Surgical History Past  Surgical History  Procedure Laterality Date  . Wisdom tooth extraction       Social History: History   Social History  . Marital Status: Single    Spouse Name: N/A    Number of Children: N/A  . Years of Education: N/A   Occupational History  . Not on file.   Social History Main Topics  . Smoking status: Never Smoker   . Smokeless tobacco: Never Used  . Alcohol Use: No  . Drug Use: No  . Sexually Active:    Other Topics Concern  . Not on file   Social History Narrative  . No narrative on file    Family History:  No family history on file.  Allergies: Aspirin; Doxycycline; and Niaspan  Meds: Prior to Admission medications   Medication Sig Start Date End Date Taking? Authorizing Provider  clindamycin (CLEOCIN) 300 MG capsule Take 2 capsules (600 mg total) by mouth 3 (three) times daily. 01/06/13  Yes Marinda Elk, MD  levothyroxine (SYNTHROID, LEVOTHROID) 150 MCG tablet Take 150 mcg by mouth daily.   Yes Historical Provider, MD  metoprolol (LOPRESSOR) 50 MG tablet Take 50 mg by mouth 2 (two) times daily.   Yes Historical Provider, MD  oxyCODONE-acetaminophen (PERCOCET/ROXICET) 5-325 MG per tablet Take 1-2 tablets by mouth every 4 (four) hours as needed. For pain   Yes Historical Provider, MD  promethazine (PHENERGAN) 25 MG tablet Take 25 mg by mouth every 6 (six) hours as needed. For nausea  Yes Historical Provider, MD  rosuvastatin (CRESTOR) 10 MG tablet Take 10 mg by mouth daily.   Yes Historical Provider, MD    Physical Exam: Filed Vitals:   01/07/13 1559 01/07/13 1925 01/07/13 2300 01/07/13 2321  BP: 155/92 167/91 170/95   Pulse: 112 96 87   Temp: 98.8 F (37.1 C)   99.7 F (37.6 C)  TempSrc: Oral   Axillary  Resp: 22 18    SpO2: 95% 95% 95%      Physical Exam: Blood pressure 170/95, pulse 87, temperature 99.7 F (37.6 C), temperature source Axillary, resp. rate 18, last menstrual period 12/03/2012, SpO2 95.00%. Gen: No acute distress. Head:  Normocephalic, atraumatic. Eyes: PERRL, EOMI, sclerae nonicteric. Mouth: Oropharynx clear. Neck: Supple, no thyromegaly, no lymphadenopathy, no jugular venous distention. Chest: Lungs clear to auscultation bilaterally. CV: Heart sounds regular. No murmurs, rubs, or gallops. Abdomen: Soft, nontender, nondistended with normal active bowel sounds. Extremities: Extremities with bilateral lower remedy edema. Cellulitis bilaterally. Skin: Warm and dry.  Erythema to RLE (inked margins); Calf with eschar and erythema with peeling skin. Neuro: Alert and oriented times 3; cranial nerves II through XII grossly intact. Psych: Mood and affect normal.  Labs on Admission:  Basic Metabolic Panel:  Recent Labs Lab 01/01/13 0650 01/02/13 0610 01/03/13 0600 01/04/13 0535 01/05/13 0602 01/06/13 0445 01/07/13 1958  NA 137 135 136 137 138 133* 138  K 4.0 3.6 3.9 3.9 4.0 4.8 5.0  CL 95* 93* 95* 98 99 97 100  CO2 22 21 23 24 22 19 24   GLUCOSE 92 91 100* 99 95 97 93  BUN 96* 94* 95* 89* 85* 79* 61*  CREATININE 11.04* 10.72* 9.99* 8.94* 7.86* 6.73* 4.91*  CALCIUM 9.3 9.4 9.3 9.1 9.4 9.2 10.0  PHOS 8.9* 8.5* 7.5* 7.4*  --   --   --    Liver Function Tests:  Recent Labs Lab 01/03/13 0600 01/04/13 0535 01/05/13 0602 01/06/13 0445 01/07/13 1958  AST 16 14 15 16 16   ALT 6 <5 <5 <5 <5  ALKPHOS 55 51 51 48 57  BILITOT 0.2* 0.3 0.3 0.3 0.5  PROT 7.6 7.4 7.5 7.7 8.8*  ALBUMIN 2.8* 2.7* 2.7* 2.6* 3.1*   CBC:  Recent Labs Lab 01/01/13 0650 01/05/13 0602 01/07/13 1958  WBC 8.2 10.0 14.6*  NEUTROABS  --   --  12.1*  HGB 12.0 10.8* 11.2*  HCT 34.8* 32.7* 34.4*  MCV 84.3 87.7 87.1  PLT 219 245 260   Cardiac Enzymes:  Recent Labs Lab 01/01/13 0650  CKTOTAL 25    CBG:  Recent Labs Lab 01/02/13 1205 01/02/13 1819 01/02/13 2127 01/03/13 0809 01/03/13 1136  GLUCAP 132* 178* 103* 90 103*    Radiological Exams on Admission: No results found.  Assessment/Plan Principal  Problem:   Cellulitis  Severe with recent sepsis, and now findings concerning for new cellulitis despite prolonged IV antibiotics and oral Cleocin.  We'll admit and place on vancomycin and Zosyn.  Recommend ID consultation in the morning. Active Problems:   HYPERTENSION  Continue metoprolol.   Morbid obesity with BMI of 70 and over, adult  Heart healthy diet.   Acute-on-chronic renal failure  Baseline creatinine 0.7-0.8. Current creatinine elevated over her usual baseline values but trending down over time. Triggered by recent sepsis.   OSA (obstructive sleep apnea) / obesity hypoventilation syndrome  Respiratory therapist consultation to provide CPAP.  Code Status: Full. Family Communication: [name] (significant other) at bedside. Disposition Plan: Home when stable.  Time  spent: 1 hour.  RAMA,CHRISTINA [facility] Pager [phone]  If 7PM-7AM, please contact night-coverage [email or web address] Password [facility] 01/07/2013, 11:46 PM

## 2013-01-07 NOTE — ED Notes (Signed)
Called [phone] to give report.  RN with another pt, asked that I call back in 5 min.

## 2013-01-08 ENCOUNTER — Encounter (HOSPITAL_COMMUNITY): Payer: Self-pay | Admitting: *Deleted

## 2013-01-08 DIAGNOSIS — N189 Chronic kidney disease, unspecified: Secondary | ICD-10-CM

## 2013-01-08 DIAGNOSIS — D649 Anemia, unspecified: Secondary | ICD-10-CM | POA: Diagnosis present

## 2013-01-08 DIAGNOSIS — E871 Hypo-osmolality and hyponatremia: Secondary | ICD-10-CM

## 2013-01-08 DIAGNOSIS — L03119 Cellulitis of unspecified part of limb: Secondary | ICD-10-CM

## 2013-01-08 DIAGNOSIS — L02419 Cutaneous abscess of limb, unspecified: Secondary | ICD-10-CM

## 2013-01-08 DIAGNOSIS — N179 Acute kidney failure, unspecified: Secondary | ICD-10-CM

## 2013-01-08 LAB — BASIC METABOLIC PANEL
CO2: 22 mEq/L (ref 19–32)
Chloride: 100 mEq/L (ref 96–112)
Sodium: 136 mEq/L (ref 135–145)

## 2013-01-08 LAB — CBC
HCT: 30.9 % — ABNORMAL LOW (ref 36.0–46.0)
Hemoglobin: 9.9 g/dL — ABNORMAL LOW (ref 12.0–15.0)
MCV: 87.8 fL (ref 78.0–100.0)
RBC: 3.52 MIL/uL — ABNORMAL LOW (ref 3.87–5.11)
WBC: 15 10*3/uL — ABNORMAL HIGH (ref 4.0–10.5)

## 2013-01-08 MED ORDER — VANCOMYCIN HCL IN DEXTROSE 1-5 GM/200ML-% IV SOLN
1000.0000 mg | Freq: Once | INTRAVENOUS | Status: AC
  Administered 2013-01-08: 1000 mg via INTRAVENOUS
  Filled 2013-01-08: qty 200

## 2013-01-08 MED ORDER — VANCOMYCIN HCL 10 G IV SOLR
2000.0000 mg | INTRAVENOUS | Status: DC
  Administered 2013-01-10: 2000 mg via INTRAVENOUS
  Filled 2013-01-08: qty 2000

## 2013-01-08 MED ORDER — SODIUM CHLORIDE 0.9 % IV SOLN: INTRAVENOUS | Status: AC

## 2013-01-08 MED ORDER — HYDROCERIN EX CREA
TOPICAL_CREAM | Freq: Every day | CUTANEOUS | Status: DC
  Administered 2013-01-08 – 2013-01-11 (×4): via TOPICAL
  Filled 2013-01-08: qty 113

## 2013-01-08 MED ORDER — PIPERACILLIN-TAZOBACTAM 3.375 G IVPB
3.3750 g | Freq: Three times a day (TID) | INTRAVENOUS | Status: DC
  Administered 2013-01-08 (×2): 3.375 g via INTRAVENOUS
  Filled 2013-01-08 (×5): qty 50

## 2013-01-08 MED ORDER — OXYCODONE-ACETAMINOPHEN 5-325 MG PO TABS
1.0000 | ORAL_TABLET | ORAL | Status: DC | PRN
  Administered 2013-01-10 – 2013-01-11 (×2): 1 via ORAL
  Filled 2013-01-08 (×2): qty 1

## 2013-01-08 MED ORDER — CLINDAMYCIN PHOSPHATE 600 MG/50ML IV SOLN
600.0000 mg | Freq: Three times a day (TID) | INTRAVENOUS | Status: DC
  Administered 2013-01-08: 600 mg via INTRAVENOUS
  Filled 2013-01-08 (×4): qty 50

## 2013-01-08 MED ORDER — MORPHINE SULFATE 2 MG/ML IJ SOLN: 2.0000 mg | INTRAMUSCULAR | Status: DC | PRN

## 2013-01-08 MED ORDER — HYDROCERIN EX CREA: TOPICAL_CREAM | Freq: Two times a day (BID) | CUTANEOUS | Status: DC

## 2013-01-08 NOTE — Progress Notes (Signed)
[facility] PROGRESS NOTE  [name]:[id number] DOB: 10-16-1981 DOA: 01/07/2013 PCP: Geraldo Pitter, MD  History of Present Illness:  Alexandra Beck is an 32 y.o. female with cellulitis x 3,  recently hospitalized with 3rd episode in her left lower extemity (hospitalized 12/25/2012-01/06/2013). Patient's hospital course was complicated by the quick development of severe sepsis with acute encephalopathy, acute renal failure, and acute on chronic respiratory failure. She was initially treated with vancomycin, Zosyn, and Rocephin. As her kidneys worsened these were discontinued and she was placed on ANCEF.  She was sent home on a 14 day course of clindamycin. She now returns (24 hours later) with worsening pain and erythema involving the right lower extremity. She also reports some low-grade fevers.   Assessment/Plan:  Cellulitis  Severe with recent sepsis, and now findings concerning for new cellulitis despite prolonged IV antibiotics and oral Cleocin.  Placed on Vanc / Zosyn, Clinda at admission. ID consulted. (Thank you Dr. Orvan Falconer)   HYPERTENSION  Continue metoprolol. Worsened due to pain.  Morbid obesity with BMI of 70 and over, adult  Heart healthy diet.  Acute renal failure (previous admit creatine peak at 11) Baseline creatinine 0.7-0.8. Current creatinine elevated over her usual baseline values but trending down over time from 11.0. Triggered by recent sepsis. Monitor creatinine closely.  Pharmacy dosed antibiotics.  OSA (obstructive sleep apnea) / obesity hypoventilation syndrome  Respiratory therapist consultation to provide CPAP.  Anemia  Baseline hgb approx 11-12  Currently 9.9, MCV 87.8  No evidence of bleeding - likely from recent hospitalization and kidney failure  Transfuse if it becomes necessary.   Code Status: Full Family Communication:  Spoke to mother Ayda Tancredi) on the phone Disposition Plan: home when  appropriate.   Consultants:  Infectious disease  Wound Care  Procedures:  Antibiotics:  Vanc / Zosyn / Clinda 01/07/13.   Clinda started 01/06/13  Rocephin, Vanc, Zosyn, Ancef last admission   HPI/Subjective: I went home for one day I could feel it hurting so much I could barely stand.  I can feel several knots in the leg.  Objective: Filed Vitals:   01/07/13 2300 01/07/13 2321 01/08/13 0001 01/08/13 0503  BP: 170/95  133/79 161/78  Pulse: 87  82 87  Temp:  99.7 F (37.6 C) 98.8 F (37.1 C) 98.9 F (37.2 C)  TempSrc:  Axillary Oral Oral  Resp:   18 18  Height:  5' 1.81" (1.57 m) 5' 2.4" (1.585 m)   Weight:  177 kg (390 lb 3.4 oz) 175.2 kg (386 lb 3.9 oz)   SpO2: 95%  92% 90%    Intake/Output Summary (Last 24 hours) at 01/08/13 0852 Last data filed at 01/08/13 0655  Gross per 24 hour  Intake  927.5 ml  Output      0 ml  Net  927.5 ml   Filed Weights   01/07/13 2321 01/08/13 0001  Weight: 177 kg (390 lb 3.4 oz) 175.2 kg (386 lb 3.9 oz)    Exam:   General:  Alert and oriented, lying in bed, no apparent distress  Cardiovascular: Regular rate and rhythm, no murmurs rubs or gallops  Respiratory: Clear consultation, no wheezes crackles or rales  Abdomen: Massive, soft, nontender, nondistended, no masses  Skin: Left lower extremity has large healing wound, bandage clean and dry. Right lower extremity is exquisitely tender to light touch, has area of light pink erythema more concentrated on the posterior. No breakdown of skin, no drainage. Feet have dry calloused  heels with cracked skin.  Data Reviewed: Basic Metabolic Panel:  Recent Labs Lab 01/02/13 0610 01/03/13 0600 01/04/13 0535 01/05/13 0602 01/06/13 0445 01/07/13 1958 01/08/13 0645  NA 135 136 137 138 133* 138 136  K 3.6 3.9 3.9 4.0 4.8 5.0 4.6  CL 93* 95* 98 99 97 100 100  CO2 21 23 24 22 19 24 22   GLUCOSE 91 100* 99 95 97 93 108*  BUN 94* 95* 89* 85* 79* 61* 54*  CREATININE 10.72* 9.99*  8.94* 7.86* 6.73* 4.91* 4.37*  CALCIUM 9.4 9.3 9.1 9.4 9.2 10.0 9.2  PHOS 8.5* 7.5* 7.4*  --   --   --   --    Liver Function Tests:  Recent Labs Lab 01/03/13 0600 01/04/13 0535 01/05/13 0602 01/06/13 0445 01/07/13 1958  AST 16 14 15 16 16   ALT 6 <5 <5 <5 <5  ALKPHOS 55 51 51 48 57  BILITOT 0.2* 0.3 0.3 0.3 0.5  PROT 7.6 7.4 7.5 7.7 8.8*  ALBUMIN 2.8* 2.7* 2.7* 2.6* 3.1*   CBC:  Recent Labs Lab 01/05/13 0602 01/07/13 1958 01/08/13 0645  WBC 10.0 14.6* 15.0*  NEUTROABS  --  12.1*  --   HGB 10.8* 11.2* 9.9*  HCT 32.7* 34.4* 30.9*  MCV 87.7 87.1 87.8  PLT 245 260 238   CBG:  Recent Labs Lab 01/02/13 1205 01/02/13 1819 01/02/13 2127 01/03/13 0809 01/03/13 1136  GLUCAP 132* 178* 103* 90 103*     Studies: No results found.  Scheduled Meds: . sodium chloride   Intravenous STAT  . sodium chloride   Intravenous STAT  . atorvastatin  20 mg Oral q1800  . clindamycin (CLEOCIN) IV  600 mg Intravenous Q8H  . enoxaparin (LOVENOX) injection  40 mg Subcutaneous Q24H  . levothyroxine  150 mcg Oral QAC breakfast  . metoprolol  50 mg Oral BID  . piperacillin-tazobactam (ZOSYN)  IV  3.375 g Intravenous Q8H  . [START ON 01/10/2013] vancomycin  2,000 mg Intravenous Q48H   Continuous Infusions: . sodium chloride      Principal Problem:   Cellulitis Active Problems:   HYPERTENSION   Morbid obesity with BMI of 70 and over, adult   Acute-on-chronic renal failure   OSA (obstructive sleep apnea)   Obesity hypoventilation syndrome    Time spent: 40 min    Conley Canal  [facility] Pager [phone]. If 8PM-8AM, please contact night-coverage at [email or web address], password [facility] 01/08/2013, 8:52 AM  LOS: 1 day

## 2013-01-08 NOTE — Progress Notes (Signed)
[facility]  Patient Status: Active (receiving services up to time of hospitalization)  [facility] is providing the following services: RN  If patient discharges after hours, please call [phone].   Alexandra Beck 01/08/2013, 11:01 AM

## 2013-01-08 NOTE — Consult Note (Signed)
[facility]    Date of Admission:  01/07/2013   Total days of antibiotics 16        Day 5 clindamycin        Day 2 vancomycin        Day 2 piperacillin tazobactam       Reason for Consult: Recurrent lower extremity cellulitis    Referring Physician: Dr. Rosine Beat  Principal Problem:   Recurrent cellulitis of lower leg Active Problems:   HYPERTENSION   Morbid obesity with BMI of 70 and over, adult   Acute-on-chronic renal failure   OSA (obstructive sleep apnea)   Obesity hypoventilation syndrome   Normocytic anemia   . sodium chloride   Intravenous STAT  . atorvastatin  20 mg Oral q1800  . enoxaparin (LOVENOX) injection  40 mg Subcutaneous Q24H  . hydrocerin   Topical Daily  . levothyroxine  150 mcg Oral QAC breakfast  . metoprolol  50 mg Oral BID  . [START ON 01/10/2013] vancomycin  2,000 mg Intravenous Q48H    Recommendations: 1. Continue vancomycin 2. Discontinue clindamycin and piperacillin tazobactam 3. Leg elevation   Assessment: I'm not absolutely certain if the problem on her right leg is acute infectious cellulitis developing while still on antibiotics or of acute venous insufficiency which can sometimes mimic cellulitis. It is possible that she has infection with an organism such as MRSA which is resistant to clindamycin. This could explain worsening after being switched to clindamycin recently. I favor treating her now with vancomycin as a single agent. I would also elevate her leg. I will follow with you.    HPI: Alexandra Beck is a 32 y.o. female with morbid obesity who has a history of recurrent cellulitis of both lower extremities and lower abdomen. She was hospitalized from January 28 until February 9 with left leg cellulitis and sepsis. Developed acute renal insufficiency. Her cellulitis was initially treated with vancomycin, clindamycin and piperacillin tazobactam.  This was narrowed to piperacillin tazobactam alone  and then she was switched to IV cefazolin due to concerns that the vancomycin was contributing to the renal insufficiency. She was then switched to oral clindamycin and discharged home 2 days ago. She states that she noted some new swelling and pain on her right posterior calf shortly before discharge. After discharge she did fill the prescription and take the oral clindamycin which he noted the pain and swelling of her right calf getting worse and returned for readmission within 24 hours.   Review of Systems: Pertinent items are noted in HPI.  Past Medical History  Diagnosis Date  . Hypertension   . Apnea   . CHF (congestive heart failure)   . Morbid obesity   . Hyperlipidemia     Per patient  . Prediabetes     Per patient  . Sleep apnea   . Chronic kidney disease     acute on chronic renal  . GERD (gastroesophageal reflux disease)   . Cellulitis   . Sepsis     Secondary to cellulitis  . Hypothyroidism   . Hypertension     History  Substance Use Topics  . Smoking status: Never Smoker   . Smokeless tobacco: Never Used  . Alcohol Use: No    History reviewed. No pertinent family history. Allergies  Allergen Reactions  . Aspirin     REACTION: throat swelling, hives  . Doxycycline Other (See Comments)  Abdominal pain  . Niaspan (Niacin Er)     Caused flushing    OBJECTIVE: Blood pressure 152/96, pulse 72, temperature 98.9 F (37.2 C), temperature source Oral, resp. rate 22, height 5' 2.4" (1.585 m), weight 175.2 kg (386 lb 3.9 oz), last menstrual period 12/03/2012, SpO2 96.00%. General:  She is alert and in no distress eating lunch Skin:  Has a well-circumscribed area of redness on her right posterior calf and around her right ankle. The redness is not outside of the ink borders drawn yesterday on admission. There's one area of tender swelling in the center of the redness on her posterior calf. The area is slightly warm to touch. Lungs:  Clear Cor:  Regular S1 and S2  no murmurs Abdomen:  Obese, soft and nontender  Microbiology: No results found for this or any previous visit (from the past 240 hour(s)).  Cliffton Asters, MD [facility] Infectious Disease [facility] [phone] pager   [phone] cell 01/08/2013, 2:32 PM

## 2013-01-08 NOTE — Progress Notes (Signed)
-   I have review the data and agree with above. - consult ID, add clindamycin.

## 2013-01-08 NOTE — Consult Note (Signed)
WOC consult Note Reason for Consult: Consult requested for left leg.  Pt familiar to wound care from previous admission.  Left leg with previous area of cellulitis has evolved into eschar.  Bilat feet with dry cracked callous areas.  Right leg with new area of erythremia and edema. Wound type: Left leg eschar 12X4cm, dry scabbed area removes easily when soaked and lifted away using scissors and forcepts.  Pink moist area revealed underneath to 80% of wound.  Dry scabbed are remains to 20% in area 4X4cm Drainage (amount, consistency, odor) No odor or drainage Periwound: Erythremia and edema surrounding left leg wound.  Dry peeling skin to wound edges. Dressing procedure/placement/frequency: Wound gel to promote moist healing.  Dressing changes BID; bedside nurse should scrub area to assist with removal of nonviable tissue. No topical treatment needed at this time to cellulitis on right calf.   Eucerin cream to cracked callous areas on feet Q day after soaking to soften and assist with removal of nonviable skin.  Pt could benefit from visit to podiatrist after discharge to remove callous areas.    Cammie Mcgee, RN, MSN, [employer]  [phone]

## 2013-01-09 DIAGNOSIS — I10 Essential (primary) hypertension: Secondary | ICD-10-CM

## 2013-01-09 DIAGNOSIS — G4733 Obstructive sleep apnea (adult) (pediatric): Secondary | ICD-10-CM

## 2013-01-09 DIAGNOSIS — Z6841 Body Mass Index (BMI) 40.0 and over, adult: Secondary | ICD-10-CM

## 2013-01-09 LAB — CBC
HCT: 29.9 % — ABNORMAL LOW (ref 36.0–46.0)
MCV: 88.5 fL (ref 78.0–100.0)
RDW: 14.9 % (ref 11.5–15.5)
WBC: 12.4 10*3/uL — ABNORMAL HIGH (ref 4.0–10.5)

## 2013-01-09 LAB — BASIC METABOLIC PANEL
BUN: 48 mg/dL — ABNORMAL HIGH (ref 6–23)
CO2: 23 mEq/L (ref 19–32)
Chloride: 103 mEq/L (ref 96–112)
Creatinine, Ser: 3.79 mg/dL — ABNORMAL HIGH (ref 0.50–1.10)

## 2013-01-09 MED ORDER — PROSIGHT PO TABS
1.0000 | ORAL_TABLET | Freq: Every day | ORAL | Status: DC
  Administered 2013-01-09 – 2013-01-12 (×4): 1 via ORAL
  Filled 2013-01-09 (×4): qty 1

## 2013-01-09 MED ORDER — POLYSACCHARIDE IRON COMPLEX 150 MG PO CAPS
150.0000 mg | ORAL_CAPSULE | Freq: Every day | ORAL | Status: DC
  Administered 2013-01-09 – 2013-01-12 (×4): 150 mg via ORAL
  Filled 2013-01-09 (×4): qty 1

## 2013-01-09 NOTE — Progress Notes (Signed)
Patient ID: Alexandra Beck, female   DOB: 1981-03-29, 32 y.o.   MRN: [id number]         [facility]    Date of Admission:  01/07/2013   Total days of antibiotics 17        Day 3 vancomycin         Principal Problem:   Recurrent cellulitis of lower leg Active Problems:   HYPERTENSION   Morbid obesity with BMI of 70 and over, adult   Acute-on-chronic renal failure   OSA (obstructive sleep apnea)   Obesity hypoventilation syndrome   Normocytic anemia   . atorvastatin  20 mg Oral q1800  . enoxaparin (LOVENOX) injection  40 mg Subcutaneous Q24H  . hydrocerin   Topical Daily  . iron polysaccharides  150 mg Oral Daily  . levothyroxine  150 mcg Oral QAC breakfast  . metoprolol  50 mg Oral BID  . multivitamin  1 tablet Oral Daily  . [START ON 01/10/2013] vancomycin  2,000 mg Intravenous Q48H    Subjective: She is having less pain in her right calf.  Objective: Temp:  [98.6 F (37 C)-99.1 F (37.3 C)] 98.6 F (37 C) (02/12 1452) Pulse Rate:  [71-83] 71 (02/12 1452) Resp:  [18-22] 20 (02/12 1452) BP: (142-179)/(80-85) 142/83 mmHg (02/12 1452) SpO2:  [92 %-96 %] 95 % (02/12 1452)  General: She is sleepy Decreased erythema of posterior right calf and ankle with decreased pain  Lab Results Lab Results  Component Value Date   WBC 12.4* 01/09/2013   HGB 9.5* 01/09/2013   HCT 29.9* 01/09/2013   MCV 88.5 01/09/2013   PLT 215 01/09/2013    Lab Results  Component Value Date   CREATININE 3.79* 01/09/2013   BUN 48* 01/09/2013   NA 140 01/09/2013   K 4.5 01/09/2013   CL 103 01/09/2013   CO2 23 01/09/2013   Assessment: Resolving, recurrent cellulitis.  Plan: 1. Continue vancomycin a few more days 2. Condition and treatment plan discussed at length with patient and patient's mother  Cliffton Asters, MD [facility] for Infectious Disease [facility] [phone] pager   [phone] cell 01/09/2013, 2:54 PM

## 2013-01-09 NOTE — Progress Notes (Signed)
[facility] PROGRESS NOTE  Alexandra Beck ZOX:[id number] DOB: [birth year] DOA: 01/07/2013 PCP: Geraldo Pitter, MD  History of Present Illness:  Alexandra Beck is an 32 y.o. female with cellulitis x 3,  recently hospitalized with 3rd episode in her left lower extemity (hospitalized 12/25/2012-01/06/2013). Patient'Alexandra hospital course was complicated by the quick development of severe sepsis with acute encephalopathy, acute renal failure, and acute on chronic respiratory failure. She was initially treated with vancomycin, Zosyn, and Rocephin. As her kidneys worsened these were discontinued and she was placed on ANCEF.  She was sent home on a 14 day course of clindamycin. She now returns (24 hours later) with worsening pain and erythema involving the right lower extremity. She also reports some low-grade fevers.   Assessment/Plan:  Cellulitis  Severe with recent sepsis, and now findings concerning for new cellulitis despite prolonged IV antibiotics and oral Cleocin.  Placed on Vanc / Zosyn, Clinda at admission. Now narrowed via guidance of Infectious Disease to Vanc alone. Appreciate ID consult.  Dr. Orvan Falconer questions if this is Venous Insufficiency / Cellulitis or a combination. Elevate Leg, Hot/Cold compresses, encourage movement as tolerated.  HYPERTENSION  Continue metoprolol. Worsened due to pain.  Morbid obesity with BMI of 70 and over, adult  Heart healthy diet.  Acute renal failure (previous admit creatine peak at 11) Baseline creatinine 0.7-0.8. Current creatinine elevated over her usual baseline values but trending down over time from 11.0. Triggered by recent sepsis. Monitor creatinine closely.  Pharmacy dosed antibiotics.  OSA (obstructive sleep apnea) / obesity hypoventilation syndrome  Respiratory therapist consultation to provide CPAP.  Anemia  Baseline hgb approx 11-12  Currently 9.3  No evidence of bleeding - likely from recent hospitalization and kidney  failure  Start multivitamin and Nu-iron  Transfuse if it becomes necessary.   Code Status: Full Family Communication:  Spoke to mother Alexandra Beck) on the phone Disposition Plan: home when appropriate.   Consultants:  Infectious disease  Wound Care  Antibiotics:  Vancomycin (solo) as of 11/08/13  Vanc / Laqueta Jean / Clinda 01/07/13.    Rocephin, Vanc, Zosyn, Ancef last admission   HPI/Subjective: No complaints,  A little anxious about her health and understanding why she has recurrent cellulitis Objective: Filed Vitals:   01/08/13 0503 01/08/13 1400 01/08/13 2230 01/09/13 0534  BP: 161/78 152/96 179/80 144/85  Pulse: 87 72 83 80  Temp: 98.9 F (37.2 C) 98.9 F (37.2 C) 99.1 F (37.3 C) 99.1 F (37.3 C)  TempSrc: Oral Oral Oral Oral  Resp: 18 22 18 22   Height:      Weight:      SpO2: 90% 96% 96% 92%    Intake/Output Summary (Last 24 hours) at 01/09/13 1440 Last data filed at 01/09/13 1022  Gross per 24 hour  Intake    540 ml  Output   3200 ml  Net  -2660 ml   Filed Weights   01/07/13 2321 01/08/13 0001  Weight: 177 kg (390 lb 3.4 oz) 175.2 kg (386 lb 3.9 oz)    Exam:   General:  Alert and oriented, lying in bed, no apparent distress  Cardiovascular: Regular rate and rhythm, no murmurs rubs or gallops  Respiratory: Clear consultation, no wheezes crackles or rales  Abdomen: Massive, soft, nontender, nondistended, no masses  Skin: Left lower extremity has large healing wound, bandage clean and dry. Right lower extremity is less tender to light touch, has area of  pink erythema and firmness more concentrated on the posterior. No  breakdown of skin, no drainage. Feet have dry calloused heels with cracked skin.  Data Reviewed: Basic Metabolic Panel:  Recent Labs Lab 01/03/13 0600 01/04/13 0535 01/05/13 0602 01/06/13 0445 01/07/13 1958 01/08/13 0645 01/09/13 0630  NA 136 137 138 133* 138 136 140  K 3.9 3.9 4.0 4.8 5.0 4.6 4.5  CL 95* 98 99 97 100  100 103  CO2 23 24 22 19 24 22 23   GLUCOSE 100* 99 95 97 93 108* 101*  BUN 95* 89* 85* 79* 61* 54* 48*  CREATININE 9.99* 8.94* 7.86* 6.73* 4.91* 4.37* 3.79*  CALCIUM 9.3 9.1 9.4 9.2 10.0 9.2 9.3  PHOS 7.5* 7.4*  --   --   --   --   --    Liver Function Tests:  Recent Labs Lab 01/03/13 0600 01/04/13 0535 01/05/13 0602 01/06/13 0445 01/07/13 1958  AST 16 14 15 16 16   ALT 6 <5 <5 <5 <5  ALKPHOS 55 51 51 48 57  BILITOT 0.2* 0.3 0.3 0.3 0.5  PROT 7.6 7.4 7.5 7.7 8.8*  ALBUMIN 2.8* 2.7* 2.7* 2.6* 3.1*   CBC:  Recent Labs Lab 01/05/13 0602 01/07/13 1958 01/08/13 0645 01/09/13 0630  WBC 10.0 14.6* 15.0* 12.4*  NEUTROABS  --  12.1*  --   --   HGB 10.8* 11.2* 9.9* 9.5*  HCT 32.7* 34.4* 30.9* 29.9*  MCV 87.7 87.1 87.8 88.5  PLT 245 260 238 215   CBG:  Recent Labs Lab 01/02/13 1819 01/02/13 2127 01/03/13 0809 01/03/13 1136  GLUCAP 178* 103* 90 103*     Studies: No results found.  Scheduled Meds: . atorvastatin  20 mg Oral q1800  . enoxaparin (LOVENOX) injection  40 mg Subcutaneous Q24H  . hydrocerin   Topical Daily  . levothyroxine  150 mcg Oral QAC breakfast  . metoprolol  50 mg Oral BID  . [START ON 01/10/2013] vancomycin  2,000 mg Intravenous Q48H   Continuous Infusions: . sodium chloride      Principal Problem:   Recurrent cellulitis of lower leg Active Problems:   HYPERTENSION   Morbid obesity with BMI of 70 and over, adult   Acute-on-chronic renal failure   OSA (obstructive sleep apnea)   Obesity hypoventilation syndrome   Normocytic anemia    Time spent: 40 min    Conley Canal  [facility] Pager [phone]. If 8PM-8AM, please contact night-coverage at [email or web address], password [facility] 01/09/2013, 2:40 PM  LOS: 2 days    Attending Patient seen and examined, agree with the assessment and plan. Patient afebrile, but continues to have erythema in the calf area of the right leg with tenderness. Continue with IV Vancomycin. Monitor  extremity very closely. Creatinine continues to downtrend.  Alexandra Beck

## 2013-01-09 NOTE — Progress Notes (Signed)
RT Note: Pt states she can place herself on CPAP when she is ready. Unit in room set-up with full face mask. H2O filled. Rt will continue to monitor

## 2013-01-10 DIAGNOSIS — M7989 Other specified soft tissue disorders: Secondary | ICD-10-CM

## 2013-01-10 DIAGNOSIS — L03119 Cellulitis of unspecified part of limb: Secondary | ICD-10-CM

## 2013-01-10 DIAGNOSIS — D649 Anemia, unspecified: Secondary | ICD-10-CM

## 2013-01-10 DIAGNOSIS — L02419 Cutaneous abscess of limb, unspecified: Secondary | ICD-10-CM

## 2013-01-10 LAB — BASIC METABOLIC PANEL
BUN: 40 mg/dL — ABNORMAL HIGH (ref 6–23)
Chloride: 102 mEq/L (ref 96–112)
Creatinine, Ser: 2.72 mg/dL — ABNORMAL HIGH (ref 0.50–1.10)
GFR calc Af Amer: 25 mL/min — ABNORMAL LOW (ref >90)

## 2013-01-10 MED ORDER — VANCOMYCIN HCL 10 G IV SOLR
2000.0000 mg | INTRAVENOUS | Status: DC
  Administered 2013-01-11 – 2013-01-12 (×2): 2000 mg via INTRAVENOUS
  Filled 2013-01-10 (×2): qty 2000

## 2013-01-10 MED ORDER — METOPROLOL TARTRATE 50 MG PO TABS
75.0000 mg | ORAL_TABLET | Freq: Two times a day (BID) | ORAL | Status: DC
  Administered 2013-01-10 – 2013-01-12 (×4): 75 mg via ORAL
  Filled 2013-01-10 (×5): qty 1

## 2013-01-10 NOTE — Progress Notes (Signed)
Patient ID: Alexandra Beck, female   DOB: December 15, 1980, 32 y.o.   MRN: [id number]         [facility]    Date of Admission:  01/07/2013   Total days of antibiotics 18        Day 4 vancomycin         Principal Problem:   Recurrent cellulitis of lower leg Active Problems:   HYPERTENSION   Morbid obesity with BMI of 70 and over, adult   Acute-on-chronic renal failure   OSA (obstructive sleep apnea)   Obesity hypoventilation syndrome   Normocytic anemia   . atorvastatin  20 mg Oral q1800  . enoxaparin (LOVENOX) injection  40 mg Subcutaneous Q24H  . hydrocerin   Topical Daily  . iron polysaccharides  150 mg Oral Daily  . levothyroxine  150 mcg Oral QAC breakfast  . metoprolol  75 mg Oral BID  . multivitamin  1 tablet Oral Daily  . [START ON 01/11/2013] vancomycin  2,000 mg Intravenous Q24H    Subjective: She is feeling much better today with less right leg pain. She walked to the [employer] and back and took a shower.  Objective: Temp:  [97.5 F (36.4 C)-98.3 F (36.8 C)] 97.5 F (36.4 C) (02/13 1446) Pulse Rate:  [63-77] 72 (02/13 1446) Resp:  [18] 18 (02/13 1446) BP: (143-173)/(89-101) 143/89 mmHg (02/13 1446) SpO2:  [93 %] 93 % (02/13 1446)  General: She seems to be in better spirits Right leg: There is some fading erythema that is now outside of the ink borders that were drawn on her posterior calf on admission. However the erythema is less intensely red. Also, the more prominent swelling of the posterior calf has improved and is much less tender  Lab Results Lab Results  Component Value Date   WBC 12.4* 01/09/2013   HGB 9.5* 01/09/2013   HCT 29.9* 01/09/2013   MCV 88.5 01/09/2013   PLT 215 01/09/2013    Lab Results  Component Value Date   CREATININE 2.72* 01/10/2013   BUN 40* 01/10/2013   NA 138 01/10/2013   K 4.6 01/10/2013   CL 102 01/10/2013   CO2 17* 01/10/2013   Assessment: She is improving on therapy for her recurrent cellulitis. She  is tolerating vancomycin and her renal function continues to improve. I suspect that the reason her erythema is slightly more diffuse today is that she has felt better leading her to be up walking more. A hot shower could also make the erythema more prominent. Hopefully, she will only need a few more days of IV vancomycin therapy.  Plan: 1. Continue vancomycin for now  Cliffton Asters, MD [facility] [phone] pager   [phone] cell 01/10/2013, 4:57 PM

## 2013-01-10 NOTE — Progress Notes (Signed)
ANTIBIOTIC CONSULT NOTE - FOLLOW UP  Pharmacy Consult for Vancomycin Indication: recurrent lower extremity cellulitis  Allergies  Allergen Reactions  . Aspirin     REACTION: throat swelling, hives  . Doxycycline Other (See Comments)    Abdominal pain  . Niaspan (Niacin Er)     Caused flushing    Patient Measurements: Height: 5' 2.4" (158.5 cm) Weight: 386 lb 3.9 oz (175.2 kg) IBW/kg (Calculated) : 51.02  Vital Signs: BP: 158/100 mmHg (02/13 1058) Pulse Rate: 75 (02/13 1058) Intake/Output from previous day: 02/12 0701 - 02/13 0700 In: -  Out: 3950 [Urine:3950] Intake/Output from this shift: Total I/O In: 360 [P.O.:360] Out: 650 [Urine:650]  Labs:  Recent Labs  01/07/13 1958 01/08/13 0645 01/09/13 0630 01/10/13 0705  WBC 14.6* 15.0* 12.4*  --   HGB 11.2* 9.9* 9.5*  --   PLT 260 238 215  --   CREATININE 4.91* 4.37* 3.79* 2.72*   Estimated Creatinine Clearance: 47.2 ml/min (by C-G formula based on Cr of 2.72).  Assessment:   Day # 4 Vancomycin.  Currently on 2 grams IV q48hrs.  Dose given this morning.  Zosyn dc'd 2/12.   Cellulitis noted resolving. Vanc to continue a few more days. Afebrile, WBC down 2/12.  Renal status improving.  Goal of Therapy:  Vancomycin trough level 10-15 mcg/ml  Plan:   Will adjust Vancomycin 2 grams from q48hrs to q24hrs.  Continue to follow renal function.   Dennie Fetters, [place] Pager: [phone] 01/10/2013,12:22 PM

## 2013-01-10 NOTE — Progress Notes (Signed)
[facility] [place]      Consult Note  Patient name: Alexandra Beck MRN: [id number] DOB: Aug 02, 1981 Sex: female  Consulting Physician:  Hospital service  Reason for Consult: No chief complaint on file.   HISTORY OF PRESENT ILLNESS: Alexandra Beck is an 32 y.o. female with cellulitis x 3, recently hospitalized with 3rd episode in her left lower extemity (hospitalized 12/25/2012-01/06/2013). Patient's hospital course was complicated by the quick development of severe sepsis with acute encephalopathy, acute renal failure, and acute on chronic respiratory failure. She was initially treated with vancomycin, Zosyn, and Rocephin. As her kidneys worsened these were discontinued and she was placed on ANCEF. She was sent home on a 14 day course of clindamycin. Shereturned 24 hours later with worsening pain and erythema involving the right lower extremity. She also reports some low-grade fevers.   The patient does not have a history of DVT. She has a recent ultrasound on February 10 which was negative for acute DVT. She continues to have renal insufficiency. She continues to suffer from hypothyroidism as well as obesity.  Past Medical History  Diagnosis Date  . Hypertension   . Apnea   . CHF (congestive heart failure)   . Morbid obesity   . Hyperlipidemia     Per patient  . Prediabetes     Per patient  . Sleep apnea   . Chronic kidney disease     acute on chronic renal  . GERD (gastroesophageal reflux disease)   . Cellulitis   . Sepsis     Secondary to cellulitis  . Hypothyroidism   . Hypertension     Past Surgical History  Procedure Laterality Date  . Wisdom tooth extraction      History   Social History  . Marital Status: Single    Spouse Name: N/A    Number of Children: N/A  . Years of Education: N/A   Occupational History  . Not on file.   Social History Main Topics  . Smoking status: Never Smoker   . Smokeless tobacco: Never Used  . Alcohol Use: No  .  Drug Use: No  . Sexually Active:    Other Topics Concern  . Not on file   Social History Narrative  . No narrative on file    History reviewed. No pertinent family history.  Allergies as of 01/07/2013 - Review Complete 01/07/2013  Allergen Reaction Noted  . Aspirin    . Doxycycline Other (See Comments) 04/17/2012  . Niaspan (niacin er)  04/17/2012    No current facility-administered medications on file prior to encounter.   Current Outpatient Prescriptions on File Prior to Encounter  Medication Sig Dispense Refill  . clindamycin (CLEOCIN) 300 MG capsule Take 2 capsules (600 mg total) by mouth 3 (three) times daily.  12 capsule  0  . levothyroxine (SYNTHROID, LEVOTHROID) 150 MCG tablet Take 150 mcg by mouth daily.      Marland Kitchen oxyCODONE-acetaminophen (PERCOCET/ROXICET) 5-325 MG per tablet Take 1-2 tablets by mouth every 4 (four) hours as needed. For pain      . promethazine (PHENERGAN) 25 MG tablet Take 25 mg by mouth every 6 (six) hours as needed. For nausea      . rosuvastatin (CRESTOR) 10 MG tablet Take 10 mg by mouth daily.         REVIEW OF SYSTEMS: Please see admission H&P, otherwise no changes  PHYSICAL EXAMINATION: General: The patient appears their stated age.  Vital signs are BP 143/89  Pulse 72  Temp(Src) 97.5 F (36.4 C) (Oral)  Resp 18  Ht 5' 2.4" (1.585 m)  Wt 386 lb 3.9 oz (175.2 kg)  BMI 69.74 kg/m2  SpO2 93%  LMP 12/03/2012 Pulmonary: Respirations are non-labored HEENT:  No gross abnormalities Musculoskeletal: There are no major deformities.   Neurologic: No focal weakness or paresthesias are detected, Skin: erythema on the right lower extremity. The left lower extremity has a posterior discoloration and eschar with mild amount of surrounding erythema Psychiatric: The patient has normal affect. Cardiovascular: palpable pedal pulses bilaterally  Diagnostic Studies: Ultrasound was negative for DVT   Assessment:  Recurrent lower extremity cellulitis  and ulceration, bilateral lower extremity Plan: The most likely etiology for the patient's lower extremity edema is lymphedema. However, venous insufficiency is also a possibility. Regardless, I feel that the initial treatment would be the same matter what the etiology. She needs to have leg elevation and IV antibiotics until her cellulitis resolves. Compression could also be added however this will be difficult given the birth of her legs. With the ulcer on the left leg, a Profore or UNNAS would be ideal. Once the patient is stable for discharge, I would recommend having a venous insufficiency workup performed at my office. This study is not offered here at the hospital. I will continue to follow the patient while she is in the hospital.     V. Charlena Cross, M.D. [facility] [place] Office: [phone] Pager:  [phone]

## 2013-01-10 NOTE — Progress Notes (Signed)
[facility] PROGRESS NOTE  Alexandra Beck ZOX:[id number] DOB: 1981-01-31 DOA: 01/07/2013 PCP: Geraldo Pitter, MD  History of Present Illness:  Alexandra Beck is an 32 y.o. female with cellulitis x 3,  recently hospitalized with 3rd episode in her left lower extemity (hospitalized 12/25/2012-01/06/2013). Patient's hospital course was complicated by the quick development of severe sepsis with acute encephalopathy, acute renal failure, and acute on chronic respiratory failure. She was initially treated with vancomycin, Zosyn, and Rocephin. As her kidneys worsened these were discontinued and she was placed on ANCEF.  She was sent home on a 14 day course of clindamycin. She now returns (24 hours later) with worsening pain and erythema involving the right lower extremity (4th episode of cellulitis). She also reports some low-grade fevers.   Assessment/Plan:  Cellulitis with likely venous insufficiency  2/13 Right lower extremity cellulitis appears to be spreading particularly in the dependent part of the posterior calf  Severe with recent sepsis, and now findings concerning for new cellulitis in the opposite leg despite prolonged IV antibiotics and oral Cleocin.   Placed on Vanc / Zosyn, Clinda at admission. Now narrowed via guidance of Infectious Disease to Vanc alone.  Appreciate ID consult.  Dr. Orvan Falconer questions if this is Venous Insufficiency / Cellulitis or a combination.  Will request [facility] to offer any recommendations they can regarding likely diagnosis of venous insufficiency.  Elevate Leg, Hot/Cold compresses, encourage movement as tolerated.  HYPERTENSION  Increased metoprolol dosage to 75 bid 2/13. Worsened due to pain.  Morbid obesity with BMI of 70 and over, adult  Heart healthy diet.  Acute renal failure (previous admit creatine peak at 11) Baseline creatinine 0.7-0.8. Current creatinine elevated over her usual baseline values but is trending down nicely over time from 11.0.  Triggered by recent sepsis. Monitor creatinine closely.  Pharmacy dosed antibiotics.  OSA (obstructive sleep apnea) / obesity hypoventilation syndrome  Respiratory therapist consultation to provide CPAP.  Anemia  Baseline hgb approx 11-12  Currently 9.3  No evidence of bleeding - likely from recent hospitalization and kidney failure  Start multivitamin and Nu-iron  Transfuse if it becomes necessary.   Code Status: Full Family Communication:  Spoke to mother Alexandra Beck) on the phone Disposition Plan: home when appropriate.  Hopefully 2/15?   Consultants:  Infectious disease  Wound Care  Vascular 2/13  Antibiotics:  Vancomycin (solo) as of 11/08/13  Vanc / Laqueta Jean / Clinda 01/07/13.    Rocephin, Vanc, Zosyn, Ancef last admission   HPI/Subjective: No complaints.  Right lower extremity still tender.  Painful with initial movement - but then better with further movement.  Objective: Filed Vitals:   01/09/13 1452 01/09/13 2123 01/10/13 0658 01/10/13 1058  BP: 142/83 173/101 152/100 158/100  Pulse: 71 77 63 75  Temp: 98.6 F (37 C) 98.3 F (36.8 C)    TempSrc: Oral Oral    Resp: 20 18    Height:      Weight:      SpO2: 95% 93% 93%     Intake/Output Summary (Last 24 hours) at 01/10/13 1229 Last data filed at 01/10/13 1058  Gross per 24 hour  Intake    360 ml  Output   3200 ml  Net  -2840 ml   Filed Weights   01/07/13 2321 01/08/13 0001  Weight: 177 kg (390 lb 3.4 oz) 175.2 kg (386 lb 3.9 oz)    Exam:   General:  Alert and oriented, lying in bed, no apparent distress, mother at  bedside.  Cardiovascular: Regular rate and rhythm, no murmurs rubs or gallops  Respiratory: Clear consultation, no wheezes crackles or rales  Abdomen: Massive, soft, nontender, nondistended, no masses  Skin: Left lower extremity has large healing wound, bandage clean and dry. Right lower extremity is less tender to light touch, has area of  pink erythema and firmness  more concentrated on the posterior this appears to be spreading out side of the initially traced area.  No breakdown of skin, no drainage. Feet have dry calloused heels with cracked skin.  Data Reviewed: Basic Metabolic Panel:  Recent Labs Lab 01/04/13 0535  01/06/13 0445 01/07/13 1958 01/08/13 0645 01/09/13 0630 01/10/13 0705  NA 137  < > 133* 138 136 140 138  K 3.9  < > 4.8 5.0 4.6 4.5 4.6  CL 98  < > 97 100 100 103 102  CO2 24  < > 19 24 22 23  17*  GLUCOSE 99  < > 97 93 108* 101* 90  BUN 89*  < > 79* 61* 54* 48* 40*  CREATININE 8.94*  < > 6.73* 4.91* 4.37* 3.79* 2.72*  CALCIUM 9.1  < > 9.2 10.0 9.2 9.3 9.4  PHOS 7.4*  --   --   --   --   --   --   < > = values in this interval not displayed. Liver Function Tests:  Recent Labs Lab 01/04/13 0535 01/05/13 0602 01/06/13 0445 01/07/13 1958  AST 14 15 16 16   ALT <5 <5 <5 <5  ALKPHOS 51 51 48 57  BILITOT 0.3 0.3 0.3 0.5  PROT 7.4 7.5 7.7 8.8*  ALBUMIN 2.7* 2.7* 2.6* 3.1*   CBC:  Recent Labs Lab 01/05/13 0602 01/07/13 1958 01/08/13 0645 01/09/13 0630  WBC 10.0 14.6* 15.0* 12.4*  NEUTROABS  --  12.1*  --   --   HGB 10.8* 11.2* 9.9* 9.5*  HCT 32.7* 34.4* 30.9* 29.9*  MCV 87.7 87.1 87.8 88.5  PLT 245 260 238 215    Studies: No results found.  Scheduled Meds: . atorvastatin  20 mg Oral q1800  . enoxaparin (LOVENOX) injection  40 mg Subcutaneous Q24H  . hydrocerin   Topical Daily  . iron polysaccharides  150 mg Oral Daily  . levothyroxine  150 mcg Oral QAC breakfast  . metoprolol  50 mg Oral BID  . multivitamin  1 tablet Oral Daily  . [START ON 01/11/2013] vancomycin  2,000 mg Intravenous Q24H   Continuous Infusions: . sodium chloride      Principal Problem:   Recurrent cellulitis of lower leg Active Problems:   HYPERTENSION   Morbid obesity with BMI of 70 and over, adult   Acute-on-chronic renal failure   OSA (obstructive sleep apnea)   Obesity hypoventilation syndrome   Normocytic  anemia    Time spent: 25 min    Alexandra Beck  [facility] Pager [phone]. If 8PM-8AM, please contact night-coverage at [email or web address], password [facility] 01/10/2013, 12:29 PM  LOS: 3 days    Attending -Patient seen and examined, agree with the above documentation-agree with outlined plan. Plan is to consult VVS for recurrent acute venous insufficiency, continue with antibiotics at the direction of infectious disease. Creatinine continues to trend.  S Marysue Fait

## 2013-01-11 DIAGNOSIS — E662 Morbid (severe) obesity with alveolar hypoventilation: Secondary | ICD-10-CM

## 2013-01-11 LAB — BASIC METABOLIC PANEL
CO2: 24 mEq/L (ref 19–32)
Chloride: 104 mEq/L (ref 96–112)
Creatinine, Ser: 2.33 mg/dL — ABNORMAL HIGH (ref 0.50–1.10)
Sodium: 138 mEq/L (ref 135–145)

## 2013-01-11 LAB — CBC
HCT: 30.9 % — ABNORMAL LOW (ref 36.0–46.0)
MCV: 89.3 fL (ref 78.0–100.0)
RBC: 3.46 MIL/uL — ABNORMAL LOW (ref 3.87–5.11)
WBC: 6.8 10*3/uL (ref 4.0–10.5)

## 2013-01-11 MED ORDER — AMLODIPINE BESYLATE 5 MG PO TABS
5.0000 mg | ORAL_TABLET | Freq: Every day | ORAL | Status: DC
  Administered 2013-01-11 – 2013-01-12 (×2): 5 mg via ORAL
  Filled 2013-01-11 (×2): qty 1

## 2013-01-11 NOTE — Progress Notes (Signed)
Patient ID: Alexandra Beck, female   DOB: 04-04-1981, 32 y.o.   MRN: [id number]         [facility]    Date of Admission:  01/07/2013   Total days of antibiotics 19        Day 5 vancomycin         Principal Problem:   Recurrent cellulitis of lower leg Active Problems:   HYPERTENSION   Morbid obesity with BMI of 70 and over, adult   Acute-on-chronic renal failure   OSA (obstructive sleep apnea)   Obesity hypoventilation syndrome   Normocytic anemia   Subjective: She is feeling much better. The pain in her right lower leg has resolved. She has been up walking today. She has been able to tolerate a compressive knee-high stocking on her right leg. She noted that the erythema on her right leg was continuing to faint.  Objective: Temp:  [97.7 F (36.5 C)-98.5 F (36.9 C)] 97.7 F (36.5 C) (02/14 0500) Pulse Rate:  [64-69] 64 (02/14 0500) Resp:  [16-18] 18 (02/14 0500) BP: (158-164)/(107-111) 158/107 mmHg (02/14 0500) SpO2:  [92 %-94 %] 92 % (02/14 0500)  General: She is smiling and in good spirits She has a compressive stocking on her right leg to the level of the knee. She has no significant pain with palpation of her calf.  Lab Results Lab Results  Component Value Date   WBC 6.8 01/11/2013   HGB 10.1* 01/11/2013   HCT 30.9* 01/11/2013   MCV 89.3 01/11/2013   PLT 245 01/11/2013    Lab Results  Component Value Date   CREATININE 2.33* 01/11/2013   BUN 38* 01/11/2013   NA 138 01/11/2013   K 4.6 01/11/2013   CL 104 01/11/2013   CO2 24 01/11/2013    Assessment: She is improving and I would consider stopping vancomycin tomorrow when discharging her home on no antibiotic therapy. I would encourage her to see a dermatologist for help with the dry cracked skin on her feet. I would also encourage her to continue use of the compressive stocking.  Plan: 1. Consider stopping vancomycin tomorrow  Cliffton Asters, MD [facility] for Infectious Disease [facility] [phone] pager   [phone] cell 01/11/2013, 2:49 PM

## 2013-01-11 NOTE — Progress Notes (Signed)
[facility] PROGRESS NOTE  Alexandra Beck ZOX:[id number] DOB: 1981/11/16 DOA: 01/07/2013 PCP: Geraldo Pitter, MD  History of Present Illness:  Alexandra Beck is an 32 y.o. female with cellulitis x 3,  recently hospitalized with 3rd episode in her left lower extemity (hospitalized 12/25/2012-01/06/2013). Patient's hospital course was complicated by the quick development of severe sepsis with acute encephalopathy, acute renal failure, and acute on chronic respiratory failure. She was initially treated with vancomycin, Zosyn, and Rocephin. As her kidneys worsened these were discontinued and she was placed on ANCEF.  She was sent home on a 14 day course of clindamycin. She now returns (24 hours later) with worsening pain and erythema involving the right lower extremity (4th episode of cellulitis). She also reports some low-grade fevers.   Assessment/Plan:  Cellulitis with likely venous insufficiency vs.  lymphedema  2/13 Right lower extremity cellulitis less erythematous now.  Less tender.  Severe with recent sepsis, and now findings concerning for new cellulitis in the opposite leg despite prolonged IV antibiotics and oral Cleocin.  Bacteria likely resistant to clindamycin.  Placed on Vanc / Zosyn, Clinda at admission. Now narrowed via guidance of Infectious Disease to Vanc alone.  Need to determine if she will need oral antibiotics at D/C.  If so may need Zyvox.  Appreciate ID consult.  Dr. Orvan Falconer questions if this is Venous Insufficiency / Cellulitis or a combination.  Appreciate  VVS consult.  Dr. Cory Roughen will plan for outpatient work up of venous insufficiency vs lymphedema.Elevate Leg, Hot/Cold compresses, encourage movement as tolerated.  HYPERTENSION  Increased metoprolol dosage to 75 bid 2/13. Added norvasc 5 mg 01/11/13 Worsened due to pain.  Morbid obesity with BMI of 70 and over, adult  Heart healthy diet.  Acute renal failure (previous admit creatine peak at 11) Baseline  creatinine 0.7-0.8. Current creatinine elevated over her usual baseline values but is trending down nicely over time from 11.0. Triggered by recent sepsis. Monitor creatinine closely.  Pharmacy dosed antibiotics.  OSA (obstructive sleep apnea) / obesity hypoventilation syndrome  Respiratory therapist consultation to provide CPAP.  Anemia  Baseline hgb approx 11-12  Currently trending up (10.1)  No evidence of bleeding - likely from recent hospitalization and kidney failure  Start multivitamin and Nu-iron   Code Status: Full Family Communication:  Spoke to mother Anne Boltz) at bedside. Disposition Plan: home when appropriate.  Hopefully 2/15   Consultants:  Infectious disease  Wound Care  Vascular 2/13  Antibiotics:  Vancomycin (solo) as of 11/08/13  Vanc / Laqueta Jean / Clinda 01/07/13.    Rocephin, Vanc, Zosyn, Ancef last admission   HPI/Subjective: No complaints.  Right lower extremity still tender.  Painful with initial movement - but then better with further movement.  Objective: Filed Vitals:   01/10/13 1446 01/10/13 2145 01/11/13 0053 01/11/13 0500  BP: 143/89 164/111  158/107  Pulse: 72 67 69 64  Temp: 97.5 F (36.4 C) 98.5 F (36.9 C)  97.7 F (36.5 C)  TempSrc: Oral Oral  Axillary  Resp: 18 16 18 18   Height:      Weight:      SpO2: 93% 94% 94% 92%   No intake or output data in the 24 hours ending 01/11/13 1311 Filed Weights   01/07/13 2321 01/08/13 0001  Weight: 177 kg (390 lb 3.4 oz) 175.2 kg (386 lb 3.9 oz)    Exam:   General:  Alert and oriented, lying in bed, no apparent distress, mother at bedside.  Cardiovascular: S1, S2  no murmurs rubs or gallops  Respiratory: Clear consultation, no wheezes crackles or rales  Abdomen: Massive, soft, nontender, nondistended, no masses, + BS  Skin: Left lower extremity has large healing wound, bandage clean and dry. Right lower extremity is less tender to light touch, has area of lightened pink  erythema and firmness more concentrated on the posterior   No breakdown of skin, no drainage. Feet have dry calloused heels with cracked skin.  Data Reviewed: Basic Metabolic Panel:  Recent Labs Lab 01/07/13 1958 01/08/13 0645 01/09/13 0630 01/10/13 0705 01/11/13 0715  NA 138 136 140 138 138  K 5.0 4.6 4.5 4.6 4.6  CL 100 100 103 102 104  CO2 24 22 23  17* 24  GLUCOSE 93 108* 101* 90 92  BUN 61* 54* 48* 40* 38*  CREATININE 4.91* 4.37* 3.79* 2.72* 2.33*  CALCIUM 10.0 9.2 9.3 9.4 9.6   Liver Function Tests:  Recent Labs Lab 01/05/13 0602 01/06/13 0445 01/07/13 1958  AST 15 16 16   ALT <5 <5 <5  ALKPHOS 51 48 57  BILITOT 0.3 0.3 0.5  PROT 7.5 7.7 8.8*  ALBUMIN 2.7* 2.6* 3.1*   CBC:  Recent Labs Lab 01/05/13 0602 01/07/13 1958 01/08/13 0645 01/09/13 0630 01/11/13 0715  WBC 10.0 14.6* 15.0* 12.4* 6.8  NEUTROABS  --  12.1*  --   --   --   HGB 10.8* 11.2* 9.9* 9.5* 10.1*  HCT 32.7* 34.4* 30.9* 29.9* 30.9*  MCV 87.7 87.1 87.8 88.5 89.3  PLT 245 260 238 215 245    Studies: No results found.  Scheduled Meds: . atorvastatin  20 mg Oral q1800  . enoxaparin (LOVENOX) injection  40 mg Subcutaneous Q24H  . hydrocerin   Topical Daily  . iron polysaccharides  150 mg Oral Daily  . levothyroxine  150 mcg Oral QAC breakfast  . metoprolol  75 mg Oral BID  . multivitamin  1 tablet Oral Daily  . vancomycin  2,000 mg Intravenous Q24H   Continuous Infusions: . sodium chloride      Principal Problem:   Recurrent cellulitis of lower leg Active Problems:   HYPERTENSION   Morbid obesity with BMI of 70 and over, adult   Acute-on-chronic renal failure   OSA (obstructive sleep apnea)   Obesity hypoventilation syndrome   Normocytic anemia    Time spent: 25 min    Conley Canal  [facility] Pager [phone]. If 8PM-8AM, please contact night-coverage at [email or web address], password [facility] 01/11/2013, 1:11 PM  LOS: 4 days    Attending - Patient seen and  examined, agree with the above assessment and plan. Patient's right lower extremity is significantly better this morning. Appreciate both ID and VVS evaluation. Hopefully she can be discharged home tomorrow.  Windell Norfolk MD

## 2013-01-11 NOTE — Progress Notes (Signed)
Checked with pt about CPAP and she states that she can place herself on and does not need any assistance as of now. Instructed to notify for RT if needed. RN aware.

## 2013-01-11 NOTE — Care Management Note (Signed)
    Page 1 of 1   01/11/2013     4:48:31 PM   CARE MANAGEMENT NOTE 01/11/2013  Patient:  Alexandra Beck, Alexandra Beck   Account Number:  [id number]  Date Initiated:  01/11/2013  Documentation initiated by:  Letha Cape  Subjective/Objective Assessment:   dx cellulits  admit- active with [facility] for [facility] .     Action/Plan:   Anticipated DC Date:  01/12/2013   Anticipated DC Plan:  HOME W HOME HEALTH SERVICES      DC Planning Services  CM consult      [facility] Choice  HOME HEALTH   Choice offered to / List presented to:  C-1 Patient        HH arranged  HH-1 RN      [facility] agency  [facility]   Status of service:  Completed, signed off Medicare Important Message given?   (If response is "NO", the following Medicare IM given date fields will be blank) Date Medicare IM given:   Date Additional Medicare IM given:    Discharge Disposition:  HOME W HOME HEALTH SERVICES  Per UR Regulation:  Reviewed for med. necessity/level of care/duration of stay  If discussed at Long Length of Stay Meetings, dates discussed:    Comments:  01/11/13 16:45 Letha Cape RN, BSN [phone] patient is active with [facility] for [facility], pta indep.  Patient would like to cont with [facility] , Lupita Leash with [facility] made aware patient is for dc tomorrow.  Soc will begin 24-48 hrs post discharge.

## 2013-01-12 LAB — BASIC METABOLIC PANEL
BUN: 34 mg/dL — ABNORMAL HIGH (ref 6–23)
CO2: 24 mEq/L (ref 19–32)
Chloride: 102 mEq/L (ref 96–112)
Creatinine, Ser: 2.04 mg/dL — ABNORMAL HIGH (ref 0.50–1.10)
Glucose, Bld: 113 mg/dL — ABNORMAL HIGH (ref 70–99)

## 2013-01-12 MED ORDER — AMLODIPINE BESYLATE 5 MG PO TABS: 5.0000 mg | ORAL_TABLET | Freq: Every day | ORAL | Status: DC

## 2013-01-12 MED ORDER — POLYSACCHARIDE IRON COMPLEX 150 MG PO CAPS: 150.0000 mg | ORAL_CAPSULE | Freq: Every day | ORAL | Status: DC

## 2013-01-12 NOTE — Progress Notes (Signed)
[facility]  Daily Progress Note  Assessment/Planning: CVI vs Lipedema   Left calf wound appears superficial and healing, cont w/ regimen per WOC  Venous insufficiency duplexes will need to be completed as an outpatient   Subjective    No complaints  Objective Filed Vitals:   01/11/13 1500 01/11/13 2047 01/11/13 2150 01/12/13 0430  BP: 151/90 145/97  159/85  Pulse: 60 59 62 69  Temp: 98.1 F (36.7 C) 97.9 F (36.6 C)  97.4 F (36.3 C)  TempSrc: Oral Oral  Axillary  Resp: 18 20  18   Height:      Weight:    389 lb 8.9 oz (176.7 kg)  SpO2: 97% 93%  94%    Intake/Output Summary (Last 24 hours) at 01/12/13 0955 Last data filed at 01/12/13 0549  Gross per 24 hour  Intake    740 ml  Output   2950 ml  Net  -2210 ml   PULM  CTAB CV  RRR GI  soft, NTND VASC  L calf with scab with erythematous border, no ascending cellulitis, - Karposi Stemmer sign  Laboratory CBC    Component Value Date/Time   WBC 6.8 01/11/2013 0715   WBC 9.8 12/23/2012 2054   HGB 10.1* 01/11/2013 0715   HGB 11.8* 12/23/2012 2054   HCT 30.9* 01/11/2013 0715   HCT 33.7* 12/23/2012 2054   PLT 245 01/11/2013 0715    BMET    Component Value Date/Time   NA 140 01/12/2013 0630   K 4.4 01/12/2013 0630   CL 102 01/12/2013 0630   CO2 24 01/12/2013 0630   GLUCOSE 113* 01/12/2013 0630   BUN 34* 01/12/2013 0630   CREATININE 2.04* 01/12/2013 0630   CALCIUM 9.6 01/12/2013 0630   CALCIUM 9.6 06/26/2007 0000   GFRNONAA 31* 01/12/2013 0630   GFRAA 36* 01/12/2013 0630    Leonides Sake, MD [facility] [place] Office: [phone] Pager: [phone]  01/12/2013, 9:55 AM

## 2013-01-12 NOTE — Progress Notes (Signed)
NURSING PROGRESS NOTE  Alexandra Beck [id number] Discharge Data: 01/12/2013 12:45 PM Attending Provider: Maretta Bees, MD [name],[name], MD     Wendi Maya to be D/C'd Home per MD order.  Discussed with the patient the After Visit Summary and all questions fully answered. All IV's discontinued with no bleeding noted. All belongings returned to patient for patient to take home.   Last Vital Signs:  Blood pressure 159/85, pulse 69, temperature 97.4 F (36.3 C), temperature source Axillary, resp. rate 18, height 5' 2.4" (1.585 m), weight 176.7 kg (389 lb 8.9 oz), last menstrual period 12/03/2012, SpO2 94.00%.  Discharge Medication List   Medication List    STOP taking these medications       clindamycin 300 MG capsule  Commonly known as:  CLEOCIN      TAKE these medications       amLODipine 5 MG tablet  Commonly known as:  NORVASC  Take 1 tablet (5 mg total) by mouth daily.     iron polysaccharides 150 MG capsule  Commonly known as:  NIFEREX  Take 1 capsule (150 mg total) by mouth daily.     levothyroxine 150 MCG tablet  Commonly known as:  SYNTHROID, LEVOTHROID  Take 150 mcg by mouth daily.     metoprolol 50 MG tablet  Commonly known as:  LOPRESSOR  Take 50 mg by mouth 2 (two) times daily.     oxyCODONE-acetaminophen 5-325 MG per tablet  Commonly known as:  PERCOCET/ROXICET  Take 1-2 tablets by mouth every 4 (four) hours as needed. For pain     promethazine 25 MG tablet  Commonly known as:  PHENERGAN  Take 25 mg by mouth every 6 (six) hours as needed. For nausea     rosuvastatin 10 MG tablet  Commonly known as:  CRESTOR  Take 10 mg by mouth daily.        Brynn Reznik, Elmarie Mainland, RN

## 2013-01-12 NOTE — Discharge Summary (Signed)
PATIENT DETAILS Name: Alexandra Beck Age: 32 y.o. Sex: female Date of Birth: 12-08-1980 MRN: [id number]. Admit Date: 01/07/2013 Admitting Physician: Maryruth Bun Rama, MD [name],[name], MD  Recommendations for Outpatient Follow-up:  1. Follow up renal function till it normalizes 2. Monitor CBC periodically  3. Outpatient vascular surgery referral   PRIMARY DISCHARGE DIAGNOSIS:  Principal Problem:   Recurrent cellulitis of lower leg Active Problems:   HYPERTENSION   Morbid obesity with BMI of 70 and over, adult   Acute-on-chronic renal failure   OSA (obstructive sleep apnea)   Obesity hypoventilation syndrome   Normocytic anemia      PAST MEDICAL HISTORY: Past Medical History  Diagnosis Date  . Hypertension   . Apnea   . CHF (congestive heart failure)   . Morbid obesity   . Hyperlipidemia     Per patient  . Prediabetes     Per patient  . Sleep apnea   . Chronic kidney disease     acute on chronic renal  . GERD (gastroesophageal reflux disease)   . Cellulitis   . Sepsis     Secondary to cellulitis  . Hypothyroidism   . Hypertension     DISCHARGE MEDICATIONS:   Medication List    STOP taking these medications       clindamycin 300 MG capsule  Commonly known as:  CLEOCIN      TAKE these medications       amLODipine 5 MG tablet  Commonly known as:  NORVASC  Take 1 tablet (5 mg total) by mouth daily.     iron polysaccharides 150 MG capsule  Commonly known as:  NIFEREX  Take 1 capsule (150 mg total) by mouth daily.     levothyroxine 150 MCG tablet  Commonly known as:  SYNTHROID, LEVOTHROID  Take 150 mcg by mouth daily.     metoprolol 50 MG tablet  Commonly known as:  LOPRESSOR  Take 50 mg by mouth 2 (two) times daily.     oxyCODONE-acetaminophen 5-325 MG per tablet  Commonly known as:  PERCOCET/ROXICET  Take 1-2 tablets by mouth every 4 (four) hours as needed. For pain     promethazine 25 MG tablet  Commonly known as:  PHENERGAN  Take 25 mg by  mouth every 6 (six) hours as needed. For nausea     rosuvastatin 10 MG tablet  Commonly known as:  CRESTOR  Take 10 mg by mouth daily.         BRIEF HPI:  See H&P, Labs, Consult and Test reports for all details in brief, patient is a 32 year old morbidly obese Caucasian female with a history of obstructive sleep apnea, recent renal failure from ATN was readmitted today after she was discharged for worsening soft tissue infection of her right lower extremity. For further details please see the history and physical that was dictated on admission.  CONSULTATIONS:   ID and vascular surgery  PERTINENT RADIOLOGIC STUDIES: Ct Femur Left Wo Contrast  12/25/2012  *RADIOLOGY REPORT*  Clinical Data: Pain and swelling.  CT OF THE LEFT FEMUR WITHOUT CONTRAST,CT TIBIA FIBULA LEFT WITHOUT CONTRAST  Comparison: None.  Findings: Mild diffuse cellulitis involving the left lower extremity most notably in the mid distal tibia / fibula region.  No discrete soft tissue abscess or findings for myofasciitis.  The bony structures are intact.  No findings to suggest septic arthritis or osteomyelitis.  Slightly enlarged / inflamed left inguinal lymph nodes are noted.  IMPRESSION: Cellulitis without findings for focal  soft tissue abscess, myofasciitis, septic arthritis or osteomyelitis.   Original Report Authenticated By: Rudie Meyer, M.D.    Ct Tibia Fibula Left Wo Contrast  12/25/2012  *RADIOLOGY REPORT*  Clinical Data: Pain and swelling.  CT OF THE LEFT FEMUR WITHOUT CONTRAST,CT TIBIA FIBULA LEFT WITHOUT CONTRAST  Comparison: None.  Findings: Mild diffuse cellulitis involving the left lower extremity most notably in the mid distal tibia / fibula region.  No discrete soft tissue abscess or findings for myofasciitis.  The bony structures are intact.  No findings to suggest septic arthritis or osteomyelitis.  Slightly enlarged / inflamed left inguinal lymph nodes are noted.  IMPRESSION: Cellulitis without findings for  focal soft tissue abscess, myofasciitis, septic arthritis or osteomyelitis.   Original Report Authenticated By: Rudie Meyer, M.D.    US Renal Port  12/25/2012  *RADIOLOGY REPORT*  Clinical Data: Worsening renal function.  RENAL/URINARY TRACT ULTRASOUND COMPLETE  Comparison:  MRI 12/28/2005  Findings:  Right Kidney:  12.0 cm. Normal size and echotexture.  No focal abnormality.  No hydronephrosis.  Left Kidney:  12.8 cm. Normal size and echotexture.  No focal abnormality.  No hydronephrosis.  Bladder:  Not visualized.  IMPRESSION: No acute renal abnormality.  No hydronephrosis.  Nonvisualization of the bladder.   Original Report Authenticated By: Charlett Nose, M.D.    Dg Chest Port 1 View  12/27/2012  *RADIOLOGY REPORT*  Clinical Data: Respiratory failure.  Shortness of breath.  PORTABLE CHEST - 1 VIEW  Comparison: Chest 12/26/2012 and 12/25/2012.  PA and lateral chest 09/16/2011.  Findings: Right IJ catheter remains in place.  There is cardiomegaly and mild vascular congestion.  No consolidative process, pneumothorax or effusion.  IMPRESSION: Cardiomegaly and mild vascular congestion.   Original Report Authenticated By: Holley Dexter, M.D.    Dg Chest Port 1 View  12/26/2012  *RADIOLOGY REPORT*  Clinical Data: Shortness of breath.  Respiratory failure.  PORTABLE CHEST - 1 VIEW  Comparison: 12/25/2012  Findings: Central line tip is in the superior vena cava in good position.  There is new slight pulmonary vascular congestion.  No consolidative infiltrates or effusions.  The heart size is normal.  IMPRESSION: New pulmonary vascular congestion.   Original Report Authenticated By: Francene Boyers, M.D.    Dg Chest Port 1 View  12/25/2012  *RADIOLOGY REPORT*  Clinical Data: Central line placement  PORTABLE CHEST - 1 VIEW  Comparison: Prior chest x-ray performed earlier today, 12/25/2012 at 01:23 a.m.  Findings: Interval placement of a right IJ central venous catheter. The tip projects over the superior  cavoatrial junction.  No evidence of complicating pneumothorax.  Inspiratory volumes are low and there is increased bibasilar atelectasis.  Stable cardiomegaly. No acute osseous abnormality.  Mild pulmonary vascular congestion.  IMPRESSION:  1.  Interval placement of a right IJ central venous catheter without evidence of complicating pneumothorax.  The tip of the catheter projects over the superior cavoatrial junction. 2.  Lower inspiratory volumes with increased bibasilar atelectasis 3.  Mild pulmonary vascular congestion   Original Report Authenticated By: Malachy Moan, M.D.    Dg Chest Port 1 View  12/25/2012  *RADIOLOGY REPORT*  Clinical Data: Hypoxia, shortness of breath.  PORTABLE CHEST - 1 VIEW  Comparison: 09/16/2011  Findings: Hypoaeration.  Heart size upper normal to mildly enlarged.  Mild bibasilar opacities.  No definite pleural effusion or pneumothorax.  No acute osseous finding.  IMPRESSION: Hypoaeration with mild bibasilar opacities; atelectasis versus early infiltrate.   Original Report Authenticated By: Greig Castilla  DelGaizo, M.D.      PERTINENT LAB RESULTS: CBC:  Recent Labs  01/11/13 0715  WBC 6.8  HGB 10.1*  HCT 30.9*  PLT 245   CMET CMP     Component Value Date/Time   NA 140 01/12/2013 0630   K 4.4 01/12/2013 0630   CL 102 01/12/2013 0630   CO2 24 01/12/2013 0630   GLUCOSE 113* 01/12/2013 0630   BUN 34* 01/12/2013 0630   CREATININE 2.04* 01/12/2013 0630   CALCIUM 9.6 01/12/2013 0630   CALCIUM 9.6 06/26/2007 0000   PROT 8.8* 01/07/2013 1958   ALBUMIN 3.1* 01/07/2013 1958   AST 16 01/07/2013 1958   ALT <5 01/07/2013 1958   ALKPHOS 57 01/07/2013 1958   BILITOT 0.5 01/07/2013 1958   GFRNONAA 31* 01/12/2013 0630   GFRAA 36* 01/12/2013 0630    GFR Estimated Creatinine Clearance: 63.3 ml/min (by C-G formula based on Cr of 2.04). No results found for this basename: LIPASE, AMYLASE,  in the last 72 hours No results found for this basename: CKTOTAL, CKMB, CKMBINDEX, TROPONINI,   in the last 72 hours No components found with this basename: POCBNP,  No results found for this basename: DDIMER,  in the last 72 hours No results found for this basename: HGBA1C,  in the last 72 hours No results found for this basename: CHOL, HDL, LDLCALC, TRIG, CHOLHDL, LDLDIRECT,  in the last 72 hours No results found for this basename: TSH, T4TOTAL, FREET3, T3FREE, THYROIDAB,  in the last 72 hours No results found for this basename: VITAMINB12, FOLATE, FERRITIN, TIBC, IRON, RETICCTPCT,  in the last 72 hours Coags: No results found for this basename: PT, INR,  in the last 72 hours Microbiology: No results found for this or any previous visit (from the past 240 hour(s)).   BRIEF HOSPITAL COURSE:   Principal Problem:   Recurrent cellulitis of lower leg - Patient was admitted, infectious disease endocrine and vascular services were consulted. Patient was started on empiric vancomycin. It was felt that this was likely a combination of acute venous insufficiency, lymphedema and superimposed cellulitis. The area was marked, patient was monitored closely. The erythema has almost resolved it is very light, tenderness has resolved to a significant extent. Infectious disease does not recommend any further antibiotics on discharge. Patient will need close monitoring by vein and vascular services on discharge. Her workup will be done by VVS in the outpatient setting. Unfortunately she cannot tolerate compression stockings for a prolonged period of time.  Active Problems:   HYPERTENSION - The pressure was moderately controlled during her stay here. She is to continue on Lopressor, we have added amlodipine to her antihypertensive regimen.    Morbid obesity with BMI of 70 and over, adult - She has been counseled extensively regarding the importance of weight loss. -She will need referral to bariatric surgery by her primary care practitioner.    Acute-on-chronic renal failure - In the last admission,  patient had significantly elevated creatinine of 11. This is slowly started to come down, by the day of discharge had come down to almost 2. She should have a renal function panel checked during her next visit with PCP. - Acute acute renal failure was multifactorial-likely secondary to ATN in the setting of sepsis and hypotension, and also from rhabdomyolysis  Anemia - Likely secondary to critical illness and renal failure - Last hemoglobin on 2/14 was 10. - She has been placed on iron supplementation, she will need periodic CBC monitoring as an outpatient  as well    OSA (obstructive sleep apnea) - She will continue to use CPAP at home  TODAY-DAY OF DISCHARGE:  Subjective:   Alexandra Beck today has no headache,no chest abdominal pain,no new weakness tingling or numbness, feels much better wants to go home today.   Objective:   Blood pressure 159/85, pulse 69, temperature 97.4 F (36.3 C), temperature source Axillary, resp. rate 18, height 5' 2.4" (1.585 m), weight 176.7 kg (389 lb 8.9 oz), last menstrual period 12/03/2012, SpO2 94.00%.  Intake/Output Summary (Last 24 hours) at 01/12/13 1115 Last data filed at 01/12/13 0549  Gross per 24 hour  Intake    740 ml  Output   2950 ml  Net  -2210 ml    Exam Awake Alert, Oriented *3, No new F.N deficits, Normal affect [place].AT,PERRAL Supple Neck,No JVD, No cervical lymphadenopathy appriciated.  Symmetrical Chest wall movement, Good air movement bilaterally, CTAB RRR,No Gallops,Rubs or new Murmurs, No Parasternal Heave +ve B.Sounds, Abd Soft, Non tender, No organomegaly appriciated, No rebound -guarding or rigidity. No Cyanosis, Clubbing or edema, No new Rash or bruise  DISCHARGE CONDITION: Stable  DISPOSITION: SNF ALF  HOME  DISCHARGE INSTRUCTIONS:    Activity:  As tolerated with Full fall precautions use walker/cane & assistance as needed  Diet recommendation: Diabetic Diet Heart Healthy diet      Follow-up Information    Follow up with Myra Gianotti IV, Lala Lund, MD. Schedule an appointment as soon as possible for a visit in 1 week.   Contact information:   [address] [place] [address] [phone]       Follow up with Geraldo Pitter, MD. Schedule an appointment as soon as possible for a visit in 1 week.   Contact information:   [address] [place] [address] [phone]         Total Time spent on discharge equals 45 minutes.  SignedJeoffrey Massed 01/12/2013 11:15 AM

## 2013-01-13 NOTE — Progress Notes (Signed)
01/13/2013 1500 [facility] notified of pt's dc home. Isidoro Donning RN CCM Case Mgmt phone [phone]

## 2013-01-14 ENCOUNTER — Telehealth: Payer: Self-pay | Admitting: Surgery

## 2013-01-14 ENCOUNTER — Other Ambulatory Visit: Payer: Self-pay | Admitting: *Deleted

## 2013-01-14 DIAGNOSIS — M7989 Other specified soft tissue disorders: Secondary | ICD-10-CM

## 2013-01-14 NOTE — Telephone Encounter (Addendum)
Message copied by Fredrich Birks on Mon Jan 14, 2013 11:39 AM ------      Message from: Melene Plan      Created: Mon Jan 14, 2013  9:51 AM                   ----- Message -----         From: Nada Libman, MD         Sent: 01/11/2013   7:51 PM           To: Melene Plan, RN            Patient needs follow up with me in the office for venous ulcers.  She needs a bilateral venous reflux exam prior to visit.  Schedule with in the month ------  01/14/13- for documentation only, as pt is already scheduled to see VWB and vascular lab

## 2013-01-18 ENCOUNTER — Encounter: Payer: Self-pay | Admitting: Surgery

## 2013-01-21 ENCOUNTER — Ambulatory Visit (INDEPENDENT_AMBULATORY_CARE_PROVIDER_SITE_OTHER): Payer: BC Managed Care – PPO | Admitting: Surgery

## 2013-01-21 ENCOUNTER — Encounter (INDEPENDENT_AMBULATORY_CARE_PROVIDER_SITE_OTHER): Payer: BC Managed Care – PPO | Admitting: *Deleted

## 2013-01-21 ENCOUNTER — Encounter: Payer: Self-pay | Admitting: Surgery

## 2013-01-21 VITALS — BP 148/88 | HR 70 | Ht 62.4 in | Wt 366.8 lb

## 2013-01-21 DIAGNOSIS — M7989 Other specified soft tissue disorders: Secondary | ICD-10-CM

## 2013-01-21 NOTE — Progress Notes (Signed)
[facility] [place]   Patient name: Alexandra Beck MRN: [id number] DOB: 1981/10/11 Sex: female     Chief Complaint  Patient presents with  . Venous Insufficiency    New pt, possible venous insuff/ Algis Downs (hospitalist)    HISTORY OF PRESENT ILLNESS: The patient is back today for followup. She was seen at the hospital as an inpatient consult.  She is a 32 y.o. female with cellulitis x 3, recently hospitalized with her 3rd episode in her left lower extemity (hospitalized 12/25/2012-01/06/2013). Patient's hospital course was complicated by the quick development of severe sepsis with acute encephalopathy, acute renal failure, and acute on chronic respiratory failure. She was initially treated with vancomycin, Zosyn, and Rocephin. As her kidneys worsened these were discontinued and she was placed on ANCEF. She was sent home on a 14 day course of clindamycin. She returned 24 hours later with worsening pain and erythema involving the right lower extremity. She also reports some low-grade fevers.  The patient does not have a history of DVT. She has a recent ultrasound on February 10 which was negative for acute DVT. She continues to have renal insufficiency. She continues to suffer from hypothyroidism as well as obesity. Since her discharge from the hospital, she is no longer taking antibiotics. She is wearing TED hose for compression. She still complains of some swelling.   Past Medical History  Diagnosis Date  . Hypertension   . Apnea   . CHF (congestive heart failure)   . Morbid obesity   . Hyperlipidemia     Per patient  . Prediabetes     Per patient  . Sleep apnea   . Chronic kidney disease     acute on chronic renal  . GERD (gastroesophageal reflux disease)   . Cellulitis   . Sepsis     Secondary to cellulitis  . Hypothyroidism   . Hypertension   . Anemia     Past Surgical History  Procedure Laterality Date  . Wisdom tooth extraction      History    Social History  . Marital Status: Single    Spouse Name: N/A    Number of Children: N/A  . Years of Education: N/A   Occupational History  . Not on file.   Social History Main Topics  . Smoking status: Former Smoker    Types: Cigarettes    Quit date: 01/21/2011  . Smokeless tobacco: Never Used  . Alcohol Use: No  . Drug Use: No  . Sexually Active: Not on file   Other Topics Concern  . Not on file   Social History Narrative  . No narrative on file    Family History  Problem Relation Age of Onset  . Hypertension Mother   . Hypertension Father   . Heart disease Father     before age 49    Allergies as of 01/21/2013 - Review Complete 01/21/2013  Allergen Reaction Noted  . Aspirin    . Doxycycline Other (See Comments) 04/17/2012  . Niaspan (niacin er)  04/17/2012    Current Outpatient Prescriptions on File Prior to Visit  Medication Sig Dispense Refill  . amLODipine (NORVASC) 5 MG tablet Take 1 tablet (5 mg total) by mouth daily.  30 tablet  0  . levothyroxine (SYNTHROID, LEVOTHROID) 150 MCG tablet Take 150 mcg by mouth daily.      . metoprolol (LOPRESSOR) 50 MG tablet Take 50 mg by mouth 2 (two) times daily.      Marland Kitchen  rosuvastatin (CRESTOR) 10 MG tablet Take 10 mg by mouth daily.      . iron polysaccharides (NIFEREX) 150 MG capsule Take 1 capsule (150 mg total) by mouth daily.  60 capsule  0  . oxyCODONE-acetaminophen (PERCOCET/ROXICET) 5-325 MG per tablet Take 1-2 tablets by mouth every 4 (four) hours as needed. For pain      . promethazine (PHENERGAN) 25 MG tablet Take 25 mg by mouth every 6 (six) hours as needed. For nausea       No current facility-administered medications on file prior to visit.     REVIEW OF SYSTEMS: Cardiovascular: No chest pain, chest pressure, palpitations, orthopnea, or dyspnea on exertion. No claudication or rest pain,  No history of DVT or phlebitis. Positive for leg swelling Pulmonary: No productive cough, asthma or  wheezing. Neurologic: No weakness, paresthesias, aphasia, or amaurosis. No dizziness. Hematologic: No bleeding problems or clotting disorders. Musculoskeletal: No joint pain or joint swelling. Gastrointestinal: No blood in stool or hematemesis Genitourinary: No dysuria or hematuria. Psychiatric:: No history of major depression. Integumentary: No rashes or ulcers. Constitutional: No fever or chills.  PHYSICAL EXAMINATION:   Vital signs are BP 148/88  Pulse 70  Ht 5' 2.4" (1.585 m)  Wt 366 lb 12.8 oz (166.379 kg)  BMI 66.23 kg/m2  SpO2 96%  LMP 12/03/2012 General: The patient appears their stated age. HEENT:  No gross abnormalities Pulmonary:  Non labored breathing Musculoskeletal: There are no major deformities. Neurologic: No focal weakness or paresthesias are detected, Skin: There are no ulcer or rashes noted. Psychiatric: The patient has normal affect. Cardiovascular: Palpable pedal pulses bilaterally.   Diagnostic Studies Venous duplex was performed today. On the right there is reflux in the deep system as well as the superficial system. There appears to be an anterior branch on the superficial system. Vein diameters range from 0.68 to 0.84.  On the left, she also has deep system reflux as well as superficial system reflux. Vein diameters in the superficial system on the left range from 0.60-0.73. She did not have any evidence of deep vein obstruction or deep vein thrombosis.  Assessment: Venous insufficiency with recurrent cellulitis Plan: I have stressed the importance of compression therapy today with the patient. I've given her a prescription for 20-30 mm thigh-high compression stockings. She may have a difficult time getting the compression garments on do to her body habitus. We discussed potentially proceeding with laser ablation of the superficial venous system to minimize her episodes of recurrent cellulitis. Because of the insufficiency in the deep system, she will most  likely require the lifelong use of compression therapy. At her that even if we proceed with venous ablation achieved could potentially continue to have problems with swelling. She's going to give her compression stockings a 3 month trial him return to see one of my partners who does laser ablation in 3 months for further discussions.  Jorge Ny, M.D. [facility] [place] Office: [phone] Pager:  [phone]

## 2013-04-19 ENCOUNTER — Encounter: Payer: Self-pay | Admitting: Vascular Surgery

## 2013-04-23 ENCOUNTER — Ambulatory Visit: Payer: BC Managed Care – PPO | Admitting: Vascular Surgery

## 2013-07-15 ENCOUNTER — Ambulatory Visit (INDEPENDENT_AMBULATORY_CARE_PROVIDER_SITE_OTHER): Payer: BC Managed Care – PPO | Admitting: Internal Medicine

## 2013-07-15 ENCOUNTER — Other Ambulatory Visit (INDEPENDENT_AMBULATORY_CARE_PROVIDER_SITE_OTHER): Payer: BC Managed Care – PPO

## 2013-07-15 ENCOUNTER — Encounter: Payer: Self-pay | Admitting: Internal Medicine

## 2013-07-15 VITALS — BP 128/90 | HR 72 | Temp 98.6°F | Ht 62.5 in | Wt >= 6400 oz

## 2013-07-15 DIAGNOSIS — E1122 Type 2 diabetes mellitus with diabetic chronic kidney disease: Secondary | ICD-10-CM | POA: Insufficient documentation

## 2013-07-15 DIAGNOSIS — R7309 Other abnormal glucose: Secondary | ICD-10-CM

## 2013-07-15 DIAGNOSIS — N189 Chronic kidney disease, unspecified: Secondary | ICD-10-CM

## 2013-07-15 DIAGNOSIS — E039 Hypothyroidism, unspecified: Secondary | ICD-10-CM

## 2013-07-15 DIAGNOSIS — I872 Venous insufficiency (chronic) (peripheral): Secondary | ICD-10-CM | POA: Insufficient documentation

## 2013-07-15 DIAGNOSIS — R7303 Prediabetes: Secondary | ICD-10-CM

## 2013-07-15 DIAGNOSIS — E669 Obesity, unspecified: Secondary | ICD-10-CM | POA: Insufficient documentation

## 2013-07-15 DIAGNOSIS — E785 Hyperlipidemia, unspecified: Secondary | ICD-10-CM

## 2013-07-15 DIAGNOSIS — Z6841 Body Mass Index (BMI) 40.0 and over, adult: Secondary | ICD-10-CM

## 2013-07-15 DIAGNOSIS — G4733 Obstructive sleep apnea (adult) (pediatric): Secondary | ICD-10-CM

## 2013-07-15 DIAGNOSIS — F329 Major depressive disorder, single episode, unspecified: Secondary | ICD-10-CM | POA: Insufficient documentation

## 2013-07-15 DIAGNOSIS — E1169 Type 2 diabetes mellitus with other specified complication: Secondary | ICD-10-CM | POA: Insufficient documentation

## 2013-07-15 DIAGNOSIS — K219 Gastro-esophageal reflux disease without esophagitis: Secondary | ICD-10-CM | POA: Insufficient documentation

## 2013-07-15 LAB — CBC WITH DIFFERENTIAL/PLATELET
Basophils Absolute: 0 10*3/uL (ref 0.0–0.1)
Basophils Relative: 0.3 % (ref 0.0–3.0)
HCT: 38 % (ref 36.0–46.0)
Hemoglobin: 12.7 g/dL (ref 12.0–15.0)
Lymphs Abs: 1.8 10*3/uL (ref 0.7–4.0)
MCHC: 33.5 g/dL (ref 30.0–36.0)
Monocytes Relative: 7.1 % (ref 3.0–12.0)
Neutro Abs: 4.9 10*3/uL (ref 1.4–7.7)
RBC: 4.46 Mil/uL (ref 3.87–5.11)
RDW: 16.5 % — ABNORMAL HIGH (ref 11.5–14.6)

## 2013-07-15 LAB — BASIC METABOLIC PANEL
CO2: 27 mEq/L (ref 19–32)
GFR: 89.02 mL/min (ref >60.00)
Glucose, Bld: 123 mg/dL — ABNORMAL HIGH (ref 70–99)
Potassium: 4.3 mEq/L (ref 3.5–5.1)
Sodium: 137 mEq/L (ref 135–145)

## 2013-07-15 LAB — LIPID PANEL
HDL: 43.7 mg/dL (ref >39.00)
Total CHOL/HDL Ratio: 4
VLDL: 33 mg/dL (ref 0.0–40.0)

## 2013-07-15 LAB — HEPATIC FUNCTION PANEL: Total Bilirubin: 0.7 mg/dL (ref 0.3–1.2)

## 2013-07-15 NOTE — Assessment & Plan Note (Signed)
Wt Readings from Last 3 Encounters:  07/15/13 412 lb (186.882 kg)  01/21/13 366 lb 12.8 oz (166.379 kg)  01/12/13 389 lb 8.9 oz (176.7 kg)   Patient is aware that this is greatest obstacle to health. She is motivated to lose weight, but does not feel bariatric intervention is appropriate for her at this time Will check to stabilize and treat underlying metabolic issues Then consider weight loss rx to augment efforts with diet and improved exercise habits Patient understands and agrees

## 2013-07-15 NOTE — Assessment & Plan Note (Signed)
On metformin until 11/2012 hospitalization for sepsis with ARI Check a1c now and consider tx as needed

## 2013-07-15 NOTE — Progress Notes (Signed)
Subjective:    Patient ID: Alexandra Beck, female    DOB: 1981/04/10, 32 y.o.   MRN: [id number]  HPI New patient to me and our practice, here to establish with primary care provider Reviewed chronic medical issues today:  Recurrent LLE cellulitis, hx sepsis associated with same early 2014 - s/p vvs eval - considering laser ablation, noncompiant with compression stocking due to discomfort.  DM2, diet controlled - on metformin until sepsis with ARF in 11/2012. Does not check cbgs.  Morbid obesity - complicated by OSA and venous insufficiency. "always heavy", unsuccessful efforts at weight loss in past -   Hypertension -the patient reports compliance with medication(s) as prescribed. Denies adverse side effects.  Hypothyroidism -the patient reports compliance with medication(s) as prescribed. Denies adverse side effects.  Dyslipidemia - on statin -the patient reports compliance with medication(s) as prescribed. Denies adverse side effects.  Past Medical History  Diagnosis Date  . CHF (congestive heart failure)   . Morbid obesity   . Hyperlipidemia   . Prediabetes   . Chronic kidney disease   . GERD (gastroesophageal reflux disease)   . Sepsis 11/2012    Secondary to cellulitis  . Hypothyroidism   . Hypertension   . Anemia   . Recurrent cellulitis of lower leg     LLE, venous insuff  . OSA (obstructive sleep apnea)   . Obesity hypoventilation syndrome   . Morbid obesity with BMI of 70 and over, adult   . CARDIOMYOPATHY, DILATED   . Depression   . Venous insufficiency of leg    Family History  Problem Relation Age of Onset  . Hypertension Mother   . Hypertension Father   . Heart disease Father     before age 101   History  Substance Use Topics  . Smoking status: Former Smoker    Types: Cigarettes    Quit date: 01/21/2011  . Smokeless tobacco: Never Used  . Alcohol Use: No    Review of Systems Constitutional: Negative for fever or unexpected weight change.   Respiratory: Negative for cough and shortness of breath.   Cardiovascular: Negative for chest pain or palpitations.  Gastrointestinal: Negative for abdominal pain, no bowel changes.  Musculoskeletal: Negative for gait problem or joint swelling.  Skin: Negative for rash.  Neurological: Negative for dizziness or headache.  No other specific complaints in a complete review of systems (except as listed in HPI above).     Objective:   Physical Exam BP 128/90  Pulse 72  Temp(Src) 98.6 F (37 C) (Oral)  Ht 5' 2.5" (1.588 m)  Wt 412 lb (186.882 kg)  BMI 74.11 kg/m2  SpO2 90% Wt Readings from Last 3 Encounters:  07/15/13 412 lb (186.882 kg)  01/21/13 366 lb 12.8 oz (166.379 kg)  01/12/13 389 lb 8.9 oz (176.7 kg)   Constitutional: She is morbidly obese, but appears well-developed and well-nourished. No distress.  HENT: Head: Normocephalic and atraumatic. Ears: B TMs ok, no erythema or effusion; Nose: Nose normal. Mouth/Throat: Oropharynx is clear and moist. No oropharyngeal exudate.  Eyes: Conjunctivae and EOM are normal. Pupils are equal, round, and reactive to light. No scleral icterus.  Neck: Thick. Normal range of motion. Neck supple. No JVD present. No thyromegaly present.  Cardiovascular: Distant, but appears normal rate, regular rhythm and normal heart sounds.  No murmur heard. L>R 1+ BLE edema. Pulmonary/Chest: Effort normal and breath sounds diminished at bases. No respiratory distress. She has no wheezes.  Abdominal: Soft. Bowel sounds are normal.  She exhibits no distension. There is no tenderness. no masses Musculoskeletal: Normal range of motion, no joint effusions. No gross deformities Neurological: She is alert and oriented to person, place, and time. No cranial nerve deficit. Coordination, balance, strength, speech and gait are normal.  Skin: Skin is warm and dry. No rash noted. No erythema.  Psychiatric: She has a dysphoric mood and affect. Her behavior is normal. Judgment  and thought content normal.   Lab Results  Component Value Date   WBC 6.8 01/11/2013   HGB 10.1* 01/11/2013   HCT 30.9* 01/11/2013   PLT 245 01/11/2013   GLUCOSE 113* 01/12/2013   CHOL 209* 07/23/2007   TRIG 287* 07/23/2007   HDL 44 07/23/2007   LDLCALC 108* 07/23/2007   ALT <5 01/07/2013   AST 16 01/07/2013   NA 140 01/12/2013   K 4.4 01/12/2013   CL 102 01/12/2013   CREATININE 2.04* 01/12/2013   BUN 34* 01/12/2013   CO2 24 01/12/2013   TSH 1.527 12/25/2012   INR 1.18 01/07/2013   HGBA1C 6.2* 12/25/2012   MICROALBUR 8.05* 06/26/2007        Assessment & Plan:   See problem list. Medications and labs reviewed today.  Time spent with pt today 65 minutes, greater than 50% time spent counseling patient on diabetes, obesity, OSA, history of spesis and medication review. Also review of prior records

## 2013-07-15 NOTE — Assessment & Plan Note (Signed)
On treatment since 2010 Check TSH and adjust as needed

## 2013-07-15 NOTE — Assessment & Plan Note (Signed)
On crestor Check lipids, adjust as needed

## 2013-07-15 NOTE — Assessment & Plan Note (Signed)
Home CPAP, last sleep study 2006 with significant weight gain since that time associated with hypertension and hx CHF  Refer for sleep eval now to update treatment as needed Patient understands relationship between weight and her disease

## 2013-07-15 NOTE — Patient Instructions (Signed)
It was good to see you today. We have reviewed your prior records including labs and tests today Test(s) ordered today. Your results will be released to MyChart (or called to you) after review, usually within 72hours after test completion. If any changes need to be made, you will be notified at that same time. Medications reviewed and updated, no changes recommended at this time. we'll make referral to sleep specialist for update on sleep apnea . Our office will contact you regarding appointment(s) once made. Work on lifestyle changes as discussed (low fat, low carb, increased protein diet; improved exercise efforts; weight loss) to control sugar, blood pressure and cholesterol levels and/or reduce risk of developing other medical problems. Look into [email or web address] or other type of food journal to assist you in this process. Please schedule followup in 2 weeks for review, call sooner if problems.  Exercise to Lose Weight Exercise and a healthy diet may help you lose weight. Your doctor may suggest specific exercises. EXERCISE IDEAS AND TIPS  Choose low-cost things you enjoy doing, such as walking, bicycling, or exercising to workout videos.  Take stairs instead of the elevator.  Walk during your lunch break.  Park your car further away from work or school.  Go to a gym or an exercise class.  Start with 5 to 10 minutes of exercise each day. Build up to 30 minutes of exercise 4 to 6 days a week.  Wear shoes with good support and comfortable clothes.  Stretch before and after working out.  Work out until you breathe harder and your heart beats faster.  Drink extra water when you exercise.  Do not do so much that you hurt yourself, feel dizzy, or get very short of breath. Exercises that burn about 150 calories:  Running 1  miles in 15 minutes.  Playing volleyball for 45 to 60 minutes.  Washing and waxing a car for 45 to 60 minutes.  Playing touch football for 45  minutes.  Walking 1  miles in 35 minutes.  Pushing a stroller 1  miles in 30 minutes.  Playing basketball for 30 minutes.  Raking leaves for 30 minutes.  Bicycling 5 miles in 30 minutes.  Walking 2 miles in 30 minutes.  Dancing for 30 minutes.  Shoveling snow for 15 minutes.  Swimming laps for 20 minutes.  Walking up stairs for 15 minutes.  Bicycling 4 miles in 15 minutes.  Gardening for 30 to 45 minutes.  Jumping rope for 15 minutes.  Washing windows or floors for 45 to 60 minutes. Document Released: 12/17/2010 Document Revised: 02/06/2012 Document Reviewed: 12/17/2010 [facility] Patient Information 2014 [place], [place].

## 2013-07-18 ENCOUNTER — Encounter: Payer: Self-pay | Admitting: Internal Medicine

## 2013-08-02 ENCOUNTER — Ambulatory Visit (INDEPENDENT_AMBULATORY_CARE_PROVIDER_SITE_OTHER): Payer: BC Managed Care – PPO | Admitting: Internal Medicine

## 2013-08-02 ENCOUNTER — Encounter: Payer: Self-pay | Admitting: Internal Medicine

## 2013-08-02 VITALS — BP 122/90 | HR 73 | Temp 98.5°F | Wt 399.8 lb

## 2013-08-02 DIAGNOSIS — E119 Type 2 diabetes mellitus without complications: Secondary | ICD-10-CM

## 2013-08-02 DIAGNOSIS — I872 Venous insufficiency (chronic) (peripheral): Secondary | ICD-10-CM

## 2013-08-02 DIAGNOSIS — E1169 Type 2 diabetes mellitus with other specified complication: Secondary | ICD-10-CM

## 2013-08-02 DIAGNOSIS — E669 Obesity, unspecified: Secondary | ICD-10-CM

## 2013-08-02 DIAGNOSIS — Z6841 Body Mass Index (BMI) 40.0 and over, adult: Secondary | ICD-10-CM

## 2013-08-02 DIAGNOSIS — Z23 Encounter for immunization: Secondary | ICD-10-CM

## 2013-08-02 MED ORDER — HYDROCHLOROTHIAZIDE 25 MG PO TABS: 25.0000 mg | ORAL_TABLET | Freq: Every day | ORAL | Status: DC

## 2013-08-02 NOTE — Patient Instructions (Signed)
It was good to see you today. We have reviewed your prior records including labs and tests today Medications reviewed and updated, okay to use HCTZ daily if needed for swelling -no changes recommended at this time. Your prescription(s) have been submitted to your pharmacy. Please take as directed and contact our office if you believe you are having problem(s) with the medication(s). Continue work on weight loss as discussed Followup in 6-12 weeks for review of weight and medications, call sooner if problems

## 2013-08-02 NOTE — Assessment & Plan Note (Signed)
Wt Readings from Last 3 Encounters:  08/02/13 399 lb 12.8 oz (181.348 kg)  07/15/13 412 lb (186.882 kg)  01/21/13 366 lb 12.8 oz (166.379 kg)   Patient is aware that this is her greatest obstacle to health. She is motivated to lose weight, but does not feel bariatric intervention or prescription medication is appropriate for her at this time Will consider weight loss rx in future to augment efforts with ongoing diet changes and improved exercise habits Weight trains reviewed, support offered at length today

## 2013-08-02 NOTE — Assessment & Plan Note (Signed)
Chronic left leg symptoms, exacerbated by recurrent cellulitis and morbid obesity Encourage compression hose, okay for diuretic as needed -new prescription for HCTZ provided today

## 2013-08-02 NOTE — Progress Notes (Signed)
Subjective:    Patient ID: Alexandra Beck, female    DOB: [birth year], 32 y.o.   MRN: [id number]  HPI  Here for follow up -reviewed chronic medical issues today and interval medical events:  Recurrent LLE cellulitis, hx sepsis associated with same early 2014 - s/p vvs eval - considering laser ablation, noncompiant with compression stocking due to discomfort.  DM2, diet controlled - on metformin until sepsis with ARF in 11/2012. Does not check cbgs.  Morbid obesity - complicated by OSA and venous insufficiency. "always heavy", unsuccessful efforts at weight loss in past -   Hypertension -the patient reports compliance with medication(s) as prescribed. Denies adverse side effects.  Hypothyroidism -the patient reports compliance with medication(s) as prescribed. Denies adverse side effects.  Dyslipidemia - on statin -the patient reports compliance with medication(s) as prescribed. Denies adverse side effects.  Past Medical History  Diagnosis Date  . Morbid obesity   . Hyperlipidemia   . Diabetes mellitus   . Chronic kidney disease   . GERD (gastroesophageal reflux disease)   . Sepsis 11/2012    Secondary to cellulitis  . Hypothyroidism   . Hypertension   . Anemia   . Recurrent cellulitis of lower leg     LLE, venous insuff  . OSA (obstructive sleep apnea)   . Obesity hypoventilation syndrome   . Morbid obesity with BMI of 70 and over, adult   . CARDIOMYOPATHY, DILATED   . Depression   . Venous insufficiency of leg     Review of Systems  Constitutional: Positive for fatigue. Negative for fever and unexpected weight change.  Respiratory: Negative for cough and shortness of breath.   Cardiovascular: Positive for leg swelling (chronic LLE).        Objective:   Physical Exam BP 122/90  Pulse 73  Temp(Src) 98.5 F (36.9 C) (Oral)  Wt 399 lb 12.8 oz (181.348 kg)  BMI 71.91 kg/m2  SpO2 96% Wt Readings from Last 3 Encounters:  08/02/13 399 lb 12.8 oz (181.348 kg)  07/15/13  412 lb (186.882 kg)  01/21/13 366 lb 12.8 oz (166.379 kg)   Constitutional: She is morbidly obese, but appears well-developed and well-nourished. No distress.  Neck: Thick. Normal range of motion. Neck supple. No JVD present. No thyromegaly present.  Cardiovascular: Distant, but appears normal rate, regular rhythm and normal heart sounds.  No murmur heard. L>R 1+ BLE edema. Pulmonary/Chest: Effort normal and breath sounds diminished at bases. No respiratory distress. She has no wheezes.  Psychiatric: She has a mildly dysphoric mood and affect. Her behavior is normal. Judgment and thought content normal.   Lab Results  Component Value Date   WBC 7.5 07/15/2013   HGB 12.7 07/15/2013   HCT 38.0 07/15/2013   PLT 238.0 07/15/2013   GLUCOSE 123* 07/15/2013   CHOL 169 07/15/2013   TRIG 165.0* 07/15/2013   HDL 43.70 07/15/2013   LDLCALC 92 07/15/2013   ALT 16 07/15/2013   AST 15 07/15/2013   NA 137 07/15/2013   K 4.3 07/15/2013   CL 104 07/15/2013   CREATININE 0.8 07/15/2013   BUN 13 07/15/2013   CO2 27 07/15/2013   TSH 2.83 07/15/2013   INR 1.18 01/07/2013   HGBA1C 6.4 07/15/2013   MICROALBUR 8.05* 06/26/2007       Assessment & Plan:   See problem list. Medications and labs reviewed today.  Time spent with pt today 25 minutes, greater than 50% time spent counseling patient on obesity, weight loss efforts,diabetes and medication  review. Also review of prior records

## 2013-08-02 NOTE — Assessment & Plan Note (Signed)
On metformin until 11/2012 hospitalization for sepsis with ARI Now working on diet with weight reduction to control same Check A1c every 3 months, consider resuming medication as needed pending on these results and her home CBGs Lab Results  Component Value Date   HGBA1C 6.4 07/15/2013

## 2013-08-14 ENCOUNTER — Ambulatory Visit (INDEPENDENT_AMBULATORY_CARE_PROVIDER_SITE_OTHER): Payer: BC Managed Care – PPO | Admitting: Pulmonary Disease

## 2013-08-14 ENCOUNTER — Encounter: Payer: Self-pay | Admitting: Pulmonary Disease

## 2013-08-14 VITALS — BP 130/86 | HR 74 | Temp 97.6°F | Ht 62.0 in | Wt >= 6400 oz

## 2013-08-14 DIAGNOSIS — G4733 Obstructive sleep apnea (adult) (pediatric): Secondary | ICD-10-CM

## 2013-08-14 NOTE — Assessment & Plan Note (Signed)
The patient has a history of obstructive sleep apnea, and has been on CPAP since 2008.  Most recently, she has noticed worsening sleep, and nonrestorative sleep, and recurrence of her daytime sleepiness.  He is in need of a new mask and supplies, and also has an older machine that may not be functioning properly.  She has also gained significant weight over the last few years.  At this point, we'll get her referred to a new hand care company, we'll have her machine function checked, will get a new mask and supplies for her, and finally will optimize her pressure again on an automatic device.  I have also encouraged her to work aggressively on weight loss.  If she is doing well, we'll see her back in one year.

## 2013-08-14 NOTE — Patient Instructions (Addendum)
Will get you referred to a new home care company for new mask and supplies. Will have your machine checked, and use loaner auto device to optimize your pressure. Will call you with results once I receive your download. Work on weight loss. followup with me in one year if doing well.

## 2013-08-14 NOTE — Progress Notes (Signed)
Subjective:    Patient ID: Alexandra Beck, female    DOB: Mar 08, 1981, 32 y.o.   MRN: [id number]  HPI The patient is a 32 year old female who I am asked to see for management of obstructive sleep apnea.  She tells me that she was first diagnosed in 2008, but unfortunately we do not have her sleep study for review.  The patient was started on CPAP at that time, and did very well initially, but now is having worsening symptoms.  She is having restless sleep, is not rested in the mornings upon arising, and is now having significant daytime sleepiness during the day.  The patient states that she is in need of a new mask, and is also concerned that her pressure has changed given her significant weight gain.  The patient states her weight is up at least 35 pounds over the last 2 years, and her Epworth score today is 14.    Sleep Questionnaire What time do you typically go to bed?( Between what hours) 12a-2am 12a-2am at 1150 on 08/14/13 by Maisie Fus, CMA How long does it take you to fall asleep? 20-88mins 20-97mins at 1150 on 08/14/13 by Maisie Fus, CMA How many times during the night do you wake up? 2 2 at 1150 on 08/14/13 by Maisie Fus, CMA What time do you get out of bed to start your day? No Value 1030a-11a at 1150 on 08/14/13 by Maisie Fus, CMA Do you drive or operate heavy machinery in your occupation? No No at 1150 on 08/14/13 by Maisie Fus, CMA How much has your weight changed (up or down) over the past two years? (In pounds) 35 lb (15.876 kg)35 lb (15.876 kg) increase at 1150 on 08/14/13 by Maisie Fus, CMA Have you ever had a sleep study before? Yes Yes at 1150 on 08/14/13 by Maisie Fus, CMA If yes, location of study? [facility] at 1150 on 08/14/13 by Maisie Fus, CMA If yes, date of study? 2008 2008 at 1150 on 08/14/13 by Maisie Fus, CMA Do you currently use CPAP? Yes Yes at 1150 on 08/14/13 by Maisie Fus, CMA If so,  what pressure? pt states set on 237? pt states set on 237? at 1150 on 08/14/13 by Maisie Fus, CMA Do you wear oxygen at any time? No No at 1150 on 08/14/13 by Maisie Fus, CMA   Review of Systems  Constitutional: Positive for unexpected weight change. Negative for fever.  HENT: Negative for ear pain, nosebleeds, congestion, sore throat, rhinorrhea, sneezing, trouble swallowing, dental problem, postnasal drip and sinus pressure.   Eyes: Negative for redness and itching.  Respiratory: Positive for cough and shortness of breath. Negative for chest tightness and wheezing.   Cardiovascular: Positive for leg swelling ( feet swelling). Negative for palpitations.  Gastrointestinal: Negative for nausea and vomiting.  Genitourinary: Negative for dysuria.  Musculoskeletal: Positive for joint swelling ( hand swelling).  Skin: Negative for rash.  Neurological: Negative for headaches.  Hematological: Does not bruise/bleed easily.  Psychiatric/Behavioral: Negative for dysphoric mood. The patient is not nervous/anxious.        Objective:   Physical Exam Constitutional:  Morbidly obese female, no acute distress  HENT:  Nares patent without discharge  Oropharynx without exudate, palate and uvula are elongated, small oral cavity.  Eyes:  Perrla, eomi, no scleral icterus  Neck:  No JVD, no TMG  Cardiovascular:  Normal rate, regular rhythm, no rubs or  gallops.  2/6 sem        Intact distal pulses but decreased.   Pulmonary :  Normal breath sounds, no stridor or respiratory distress   No rales, rhonchi, or wheezing  Abdominal:  Soft, nondistended, bowel sounds present.  No tenderness noted.   Musculoskeletal:  + lower extremity edema noted.  Lymph Nodes:  No cervical lymphadenopathy noted  Skin:  No cyanosis noted  Neurologic:  Alert, appropriate, moves all 4 extremities without obvious deficit.         Assessment & Plan:

## 2013-08-19 ENCOUNTER — Encounter: Payer: Self-pay | Admitting: Pulmonary Disease

## 2013-08-28 ENCOUNTER — Telehealth: Payer: Self-pay | Admitting: Pulmonary Disease

## 2013-08-28 NOTE — Telephone Encounter (Signed)
The auto loaner is just to optimize her pressure for 2 weeks.  The dme usually does this for no charge.  If not willing to do this, change her to a dme that is more customer oriented.

## 2013-08-28 NOTE — Telephone Encounter (Signed)
Patient Instructions    Will get you referred to a new home care company for new mask and supplies. Will have your machine checked, and use loaner auto device to optimize your pressure. Will call you with results once I receive your download. Work on weight loss. followup with me in one year if doing well.  ----  I spoke with pt. She stated to get the loner it will cost $107 for a month. She stated financially she can't afford this right now. She is not sure what to do or what the next step would be. Please advise KC thanks

## 2013-08-29 NOTE — Telephone Encounter (Signed)
[name] ° °

## 2013-09-03 NOTE — Telephone Encounter (Signed)
Spoke to pt she is aware [facility] will be providing cpap auto with download and start with cpap supplies Alexandra Beck

## 2013-09-10 ENCOUNTER — Telehealth: Payer: Self-pay | Admitting: Pulmonary Disease

## 2013-09-10 NOTE — Telephone Encounter (Signed)
LMTCB

## 2013-09-12 ENCOUNTER — Telehealth: Payer: Self-pay | Admitting: *Deleted

## 2013-09-12 NOTE — Telephone Encounter (Signed)
Patient states that original order that was sent to [name] has now been transferred to [facility] d/t the AUTO device being expensive out of pocket.  Order sent to [facility] for CPAP with Auto and D/L to be faxed to our office to be completed (as seen below) Pt states that [facility] told her that she is now eligible for a new machine that has capabilities of an AUTO device built in. Need order for new CPAP since she qualifies.    Alexandra Beck at 09/03/2013 2:32 PM   Status: Signed            Spoke to pt she is aware [facility] will be providing cpap auto with download and start with cpap supplies Alexandra Beck   Please advise Dr [name]. Thanks.

## 2013-09-13 ENCOUNTER — Ambulatory Visit: Payer: BC Managed Care – PPO | Admitting: Internal Medicine

## 2013-09-13 ENCOUNTER — Other Ambulatory Visit: Payer: Self-pay | Admitting: Pulmonary Disease

## 2013-09-13 DIAGNOSIS — G4733 Obstructive sleep apnea (adult) (pediatric): Secondary | ICD-10-CM

## 2013-09-13 NOTE — Telephone Encounter (Signed)
See phone note created on 09-12-13. Duplicate message I will close this one. Carron Curie, CMA

## 2013-09-13 NOTE — Telephone Encounter (Signed)
[email or web address]. [name] Castillo, CMA  

## 2013-09-13 NOTE — Telephone Encounter (Signed)
Order sent.

## 2013-09-13 NOTE — Telephone Encounter (Signed)
Pt is returning call.  There is also a message opened on 09/12/13 that looks like it may be regarding the same issue.  Alexandra Beck

## 2013-09-30 ENCOUNTER — Other Ambulatory Visit: Payer: Self-pay | Admitting: *Deleted

## 2013-09-30 MED ORDER — ROSUVASTATIN CALCIUM 10 MG PO TABS: 10.0000 mg | ORAL_TABLET | Freq: Every day | ORAL | Status: DC

## 2013-09-30 MED ORDER — LEVOTHYROXINE SODIUM 150 MCG PO TABS: 150.0000 ug | ORAL_TABLET | Freq: Every day | ORAL | Status: DC

## 2013-09-30 MED ORDER — METOPROLOL TARTRATE 50 MG PO TABS: 50.0000 mg | ORAL_TABLET | Freq: Two times a day (BID) | ORAL | Status: DC

## 2013-09-30 MED ORDER — AMLODIPINE BESYLATE 5 MG PO TABS: 5.0000 mg | ORAL_TABLET | Freq: Every day | ORAL | Status: DC

## 2014-01-09 IMAGING — CR DG CHEST 1V PORT
1 series · 1 of 1 positions shown · non-contrast
Comparison: Prior chest x-ray performed earlier today, 12/25/2012
at [DATE] a.m.

CLINICAL DATA: Central line placement

PORTABLE CHEST - 1 VIEW

[AP]
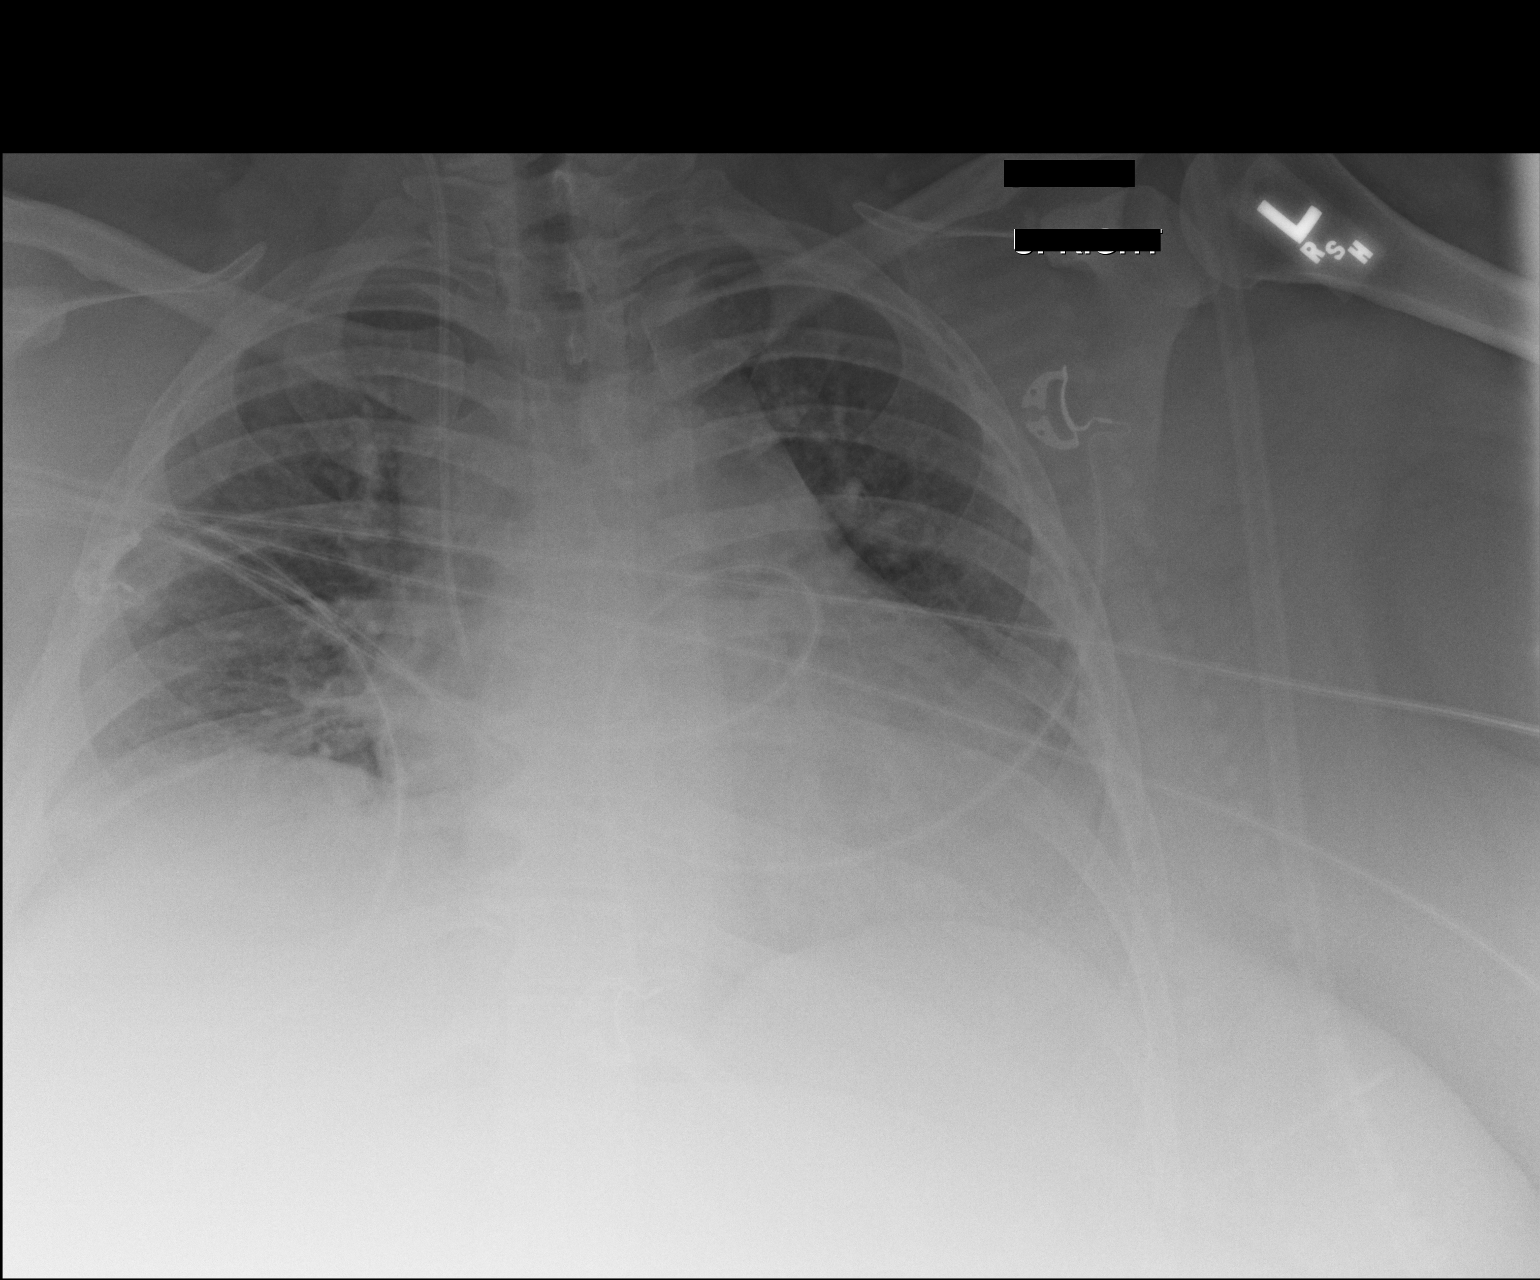

[1 of 1 positions shown; findings below may reference images not displayed]

FINDINGS: Interval placement of a right IJ central venous catheter.
The tip projects over the superior cavoatrial junction.  No
evidence of complicating pneumothorax.  Inspiratory volumes are low
and there is increased bibasilar atelectasis.  Stable cardiomegaly.
No acute osseous abnormality.  Mild pulmonary vascular congestion.
IMPRESSION: 1.  Interval placement of a right IJ central venous catheter
without evidence of complicating pneumothorax.  The tip of the
catheter projects over the superior cavoatrial junction.
2.  Lower inspiratory volumes with increased bibasilar atelectasis
3.  Mild pulmonary vascular congestion

## 2014-01-09 IMAGING — CT CT FEMUR *L* W/O CM
1 series · 7 of 14 positions shown, 9 images · non-contrast
Comparison: None.

CLINICAL DATA: Pain and swelling.

CT OF THE LEFT FEMUR WITHOUT CONTRAST,CT TIBIA FIBULA LEFT WITHOUT
CONTRAST

[Series 3: soft tissue · axial · 0.55mm/px · z∈[-557,-47]mm · 7 of 242 slices shown, 9 images]
[im 19/242  soft-tissue]
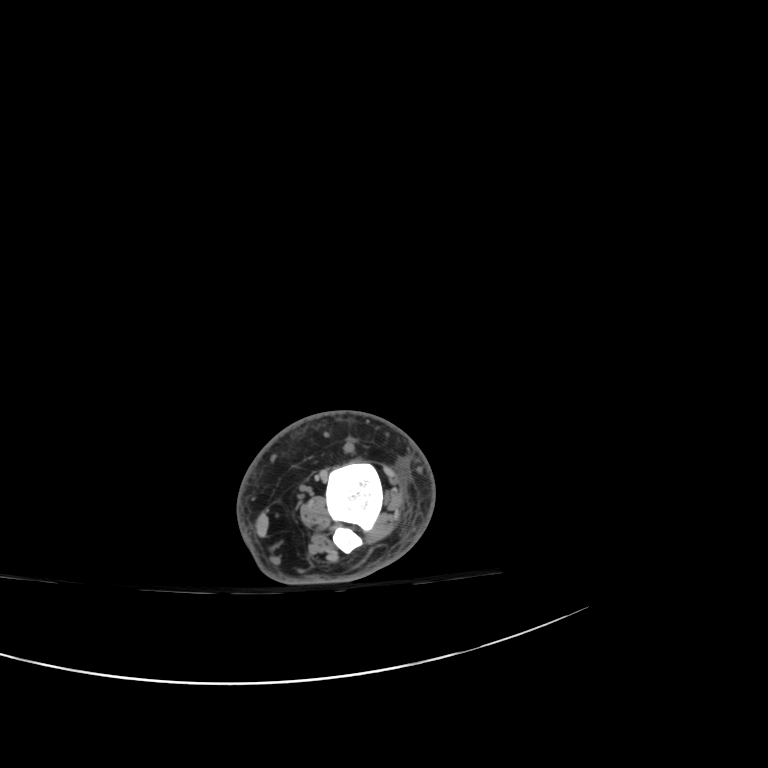
[im 19/242  bone]
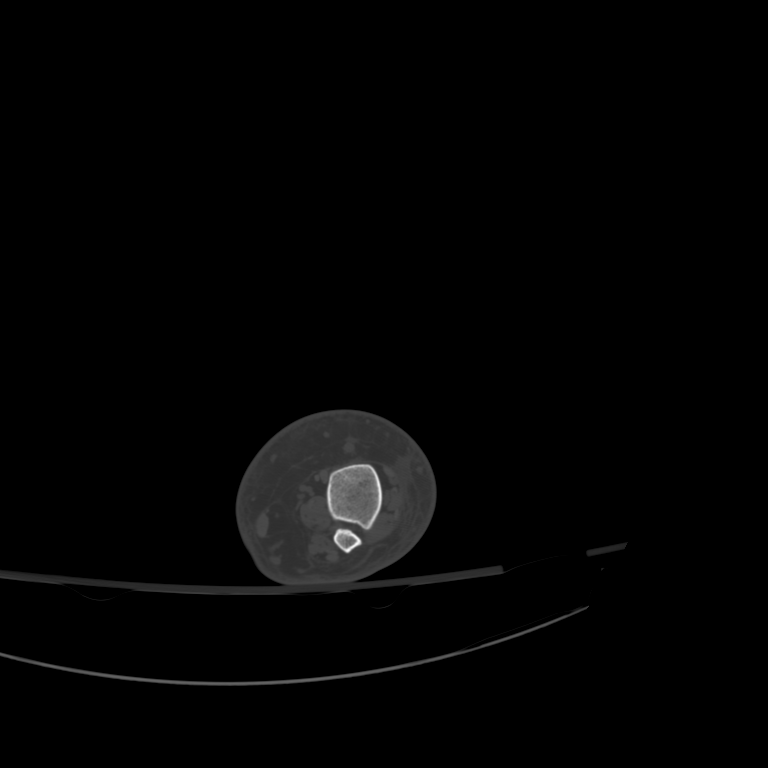
[im 56/242  bone]
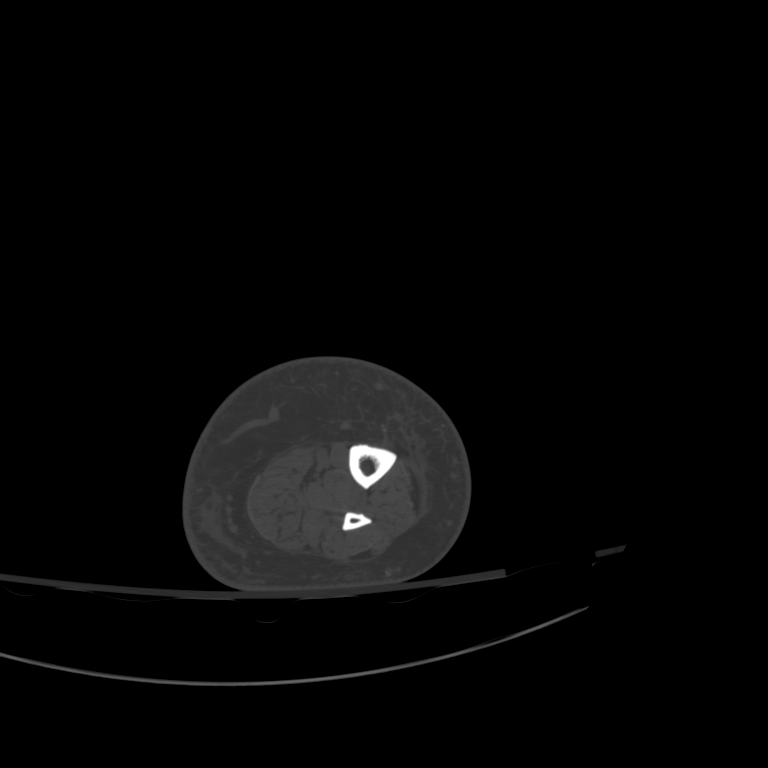
[im 93/242  bone]
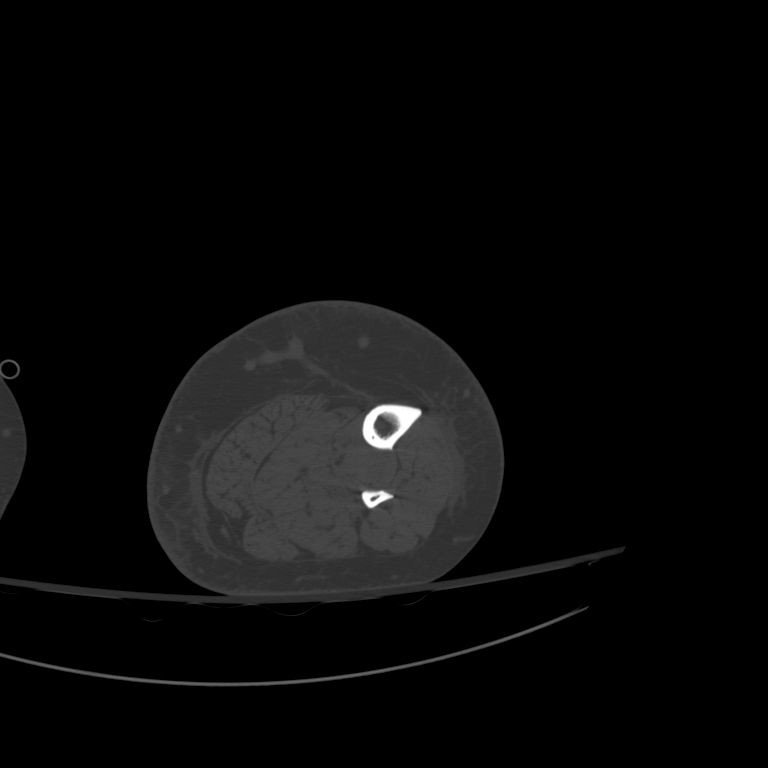
[im 130/242  bone]
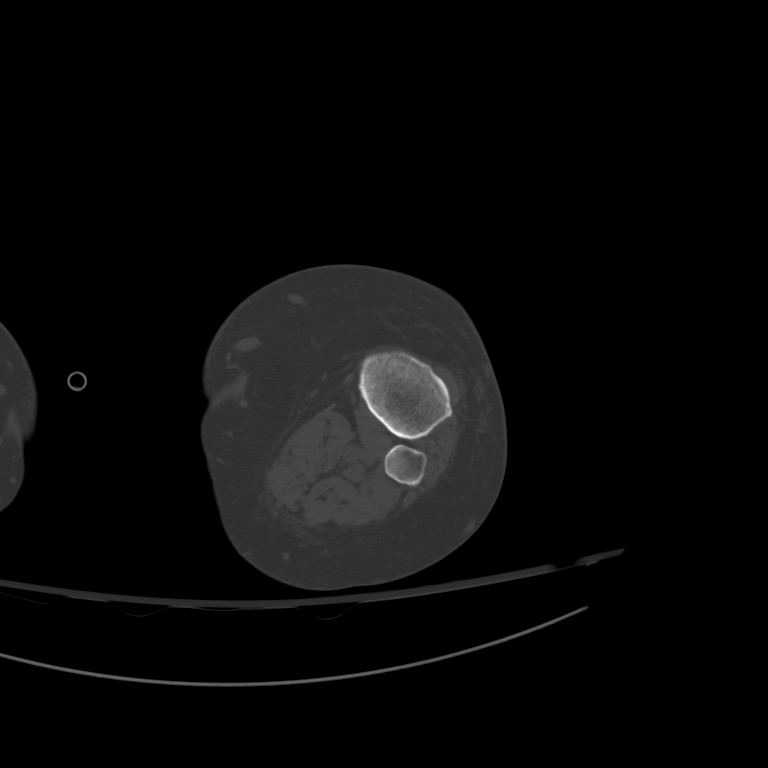
[im 149/242  soft-tissue]
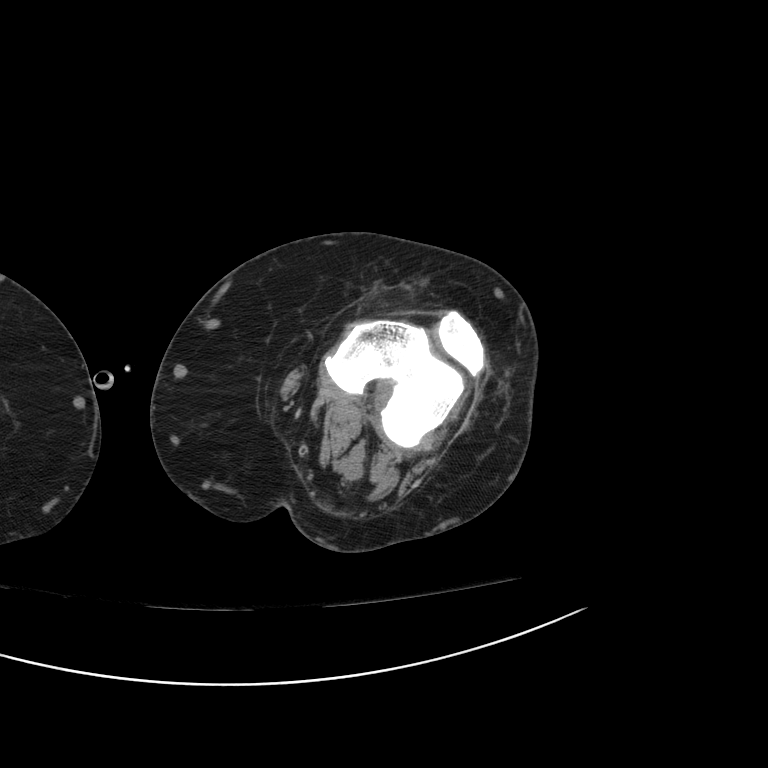
[im 149/242  bone]
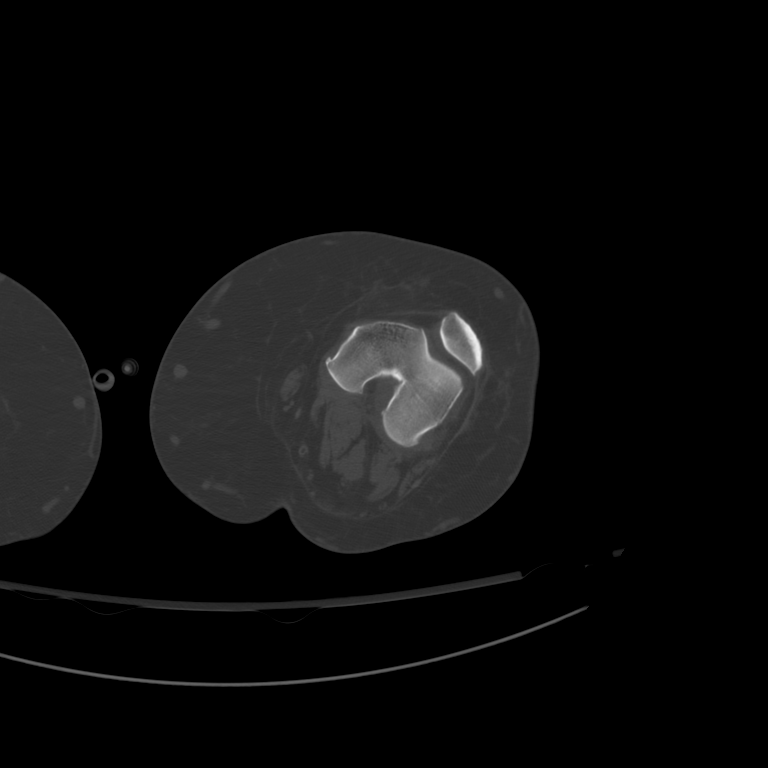
[im 186/242  bone]
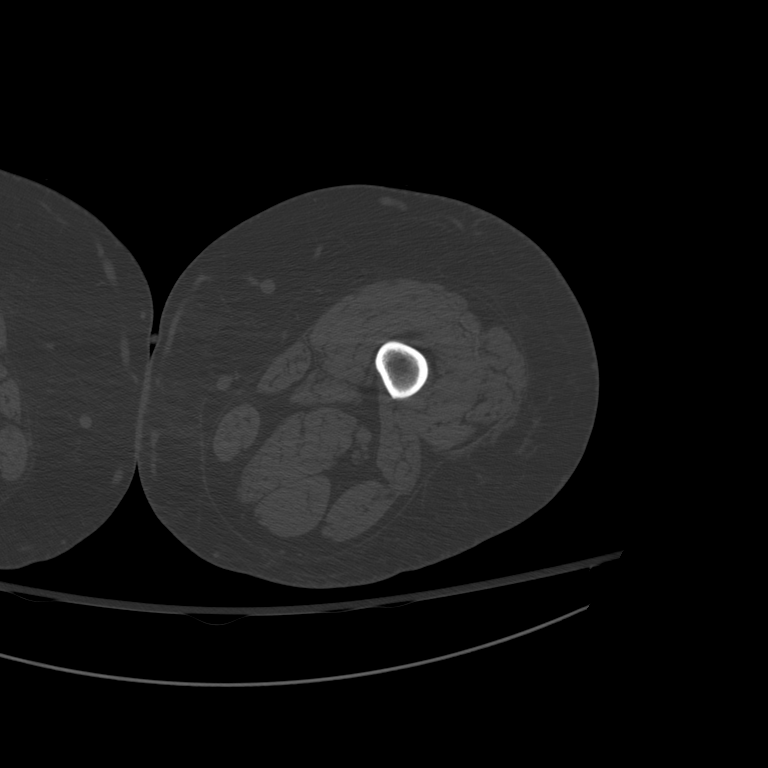
[im 223/242  bone]
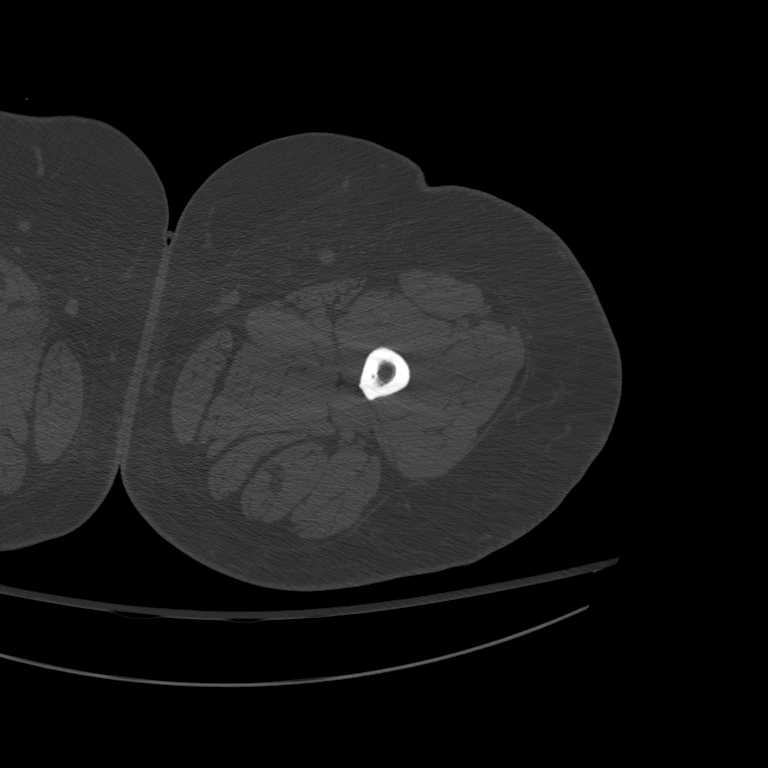

[7 of 14 positions shown; findings below may reference images not displayed]

FINDINGS: Mild diffuse cellulitis involving the left lower
extremity most notably in the mid distal tibia / fibula region.  No
discrete soft tissue abscess or findings for myofasciitis.  The
bony structures are intact.  No findings to suggest septic
arthritis or osteomyelitis.

Slightly enlarged / inflamed left inguinal lymph nodes are noted.
IMPRESSION: Cellulitis without findings for focal soft tissue abscess,
myofasciitis, septic arthritis or osteomyelitis.

## 2014-07-23 ENCOUNTER — Other Ambulatory Visit: Payer: Self-pay | Admitting: Internal Medicine

## 2014-07-23 ENCOUNTER — Emergency Department (HOSPITAL_COMMUNITY): Payer: BC Managed Care – PPO

## 2014-07-23 ENCOUNTER — Encounter (HOSPITAL_COMMUNITY): Payer: Self-pay | Admitting: Emergency Medicine

## 2014-07-23 ENCOUNTER — Inpatient Hospital Stay (HOSPITAL_COMMUNITY)
Admission: EM | Admit: 2014-07-23 | Discharge: 2014-07-28 | DRG: 871 | Disposition: A | Discharge status: Home Health | Payer: BC Managed Care – PPO | Attending: Internal Medicine | Admitting: Internal Medicine

## 2014-07-23 DIAGNOSIS — E1169 Type 2 diabetes mellitus with other specified complication: Secondary | ICD-10-CM | POA: Diagnosis present

## 2014-07-23 DIAGNOSIS — I152 Hypertension secondary to endocrine disorders: Secondary | ICD-10-CM | POA: Diagnosis present

## 2014-07-23 DIAGNOSIS — R509 Fever, unspecified: Secondary | ICD-10-CM

## 2014-07-23 DIAGNOSIS — L03116 Cellulitis of left lower limb: Secondary | ICD-10-CM

## 2014-07-23 DIAGNOSIS — E662 Morbid (severe) obesity with alveolar hypoventilation: Secondary | ICD-10-CM | POA: Diagnosis present

## 2014-07-23 DIAGNOSIS — K219 Gastro-esophageal reflux disease without esophagitis: Secondary | ICD-10-CM | POA: Diagnosis present

## 2014-07-23 DIAGNOSIS — E1122 Type 2 diabetes mellitus with diabetic chronic kidney disease: Secondary | ICD-10-CM | POA: Diagnosis present

## 2014-07-23 DIAGNOSIS — E669 Obesity, unspecified: Secondary | ICD-10-CM

## 2014-07-23 DIAGNOSIS — N189 Chronic kidney disease, unspecified: Secondary | ICD-10-CM | POA: Diagnosis present

## 2014-07-23 DIAGNOSIS — E871 Hypo-osmolality and hyponatremia: Secondary | ICD-10-CM | POA: Diagnosis not present

## 2014-07-23 DIAGNOSIS — Z6841 Body Mass Index (BMI) 40.0 and over, adult: Secondary | ICD-10-CM

## 2014-07-23 DIAGNOSIS — I872 Venous insufficiency (chronic) (peripheral): Secondary | ICD-10-CM | POA: Diagnosis present

## 2014-07-23 DIAGNOSIS — Z79899 Other long term (current) drug therapy: Secondary | ICD-10-CM

## 2014-07-23 DIAGNOSIS — Z87891 Personal history of nicotine dependence: Secondary | ICD-10-CM

## 2014-07-23 DIAGNOSIS — J9600 Acute respiratory failure, unspecified whether with hypoxia or hypercapnia: Secondary | ICD-10-CM | POA: Diagnosis present

## 2014-07-23 DIAGNOSIS — E039 Hypothyroidism, unspecified: Secondary | ICD-10-CM | POA: Diagnosis present

## 2014-07-23 DIAGNOSIS — A419 Sepsis, unspecified organism: Principal | ICD-10-CM | POA: Diagnosis present

## 2014-07-23 DIAGNOSIS — D649 Anemia, unspecified: Secondary | ICD-10-CM

## 2014-07-23 DIAGNOSIS — I10 Essential (primary) hypertension: Secondary | ICD-10-CM

## 2014-07-23 DIAGNOSIS — M79609 Pain in unspecified limb: Secondary | ICD-10-CM | POA: Diagnosis not present

## 2014-07-23 DIAGNOSIS — N170 Acute kidney failure with tubular necrosis: Secondary | ICD-10-CM | POA: Diagnosis present

## 2014-07-23 DIAGNOSIS — I428 Other cardiomyopathies: Secondary | ICD-10-CM | POA: Diagnosis present

## 2014-07-23 DIAGNOSIS — G4733 Obstructive sleep apnea (adult) (pediatric): Secondary | ICD-10-CM | POA: Diagnosis present

## 2014-07-23 DIAGNOSIS — I5032 Chronic diastolic (congestive) heart failure: Secondary | ICD-10-CM

## 2014-07-23 DIAGNOSIS — L03119 Cellulitis of unspecified part of limb: Secondary | ICD-10-CM | POA: Diagnosis present

## 2014-07-23 DIAGNOSIS — E785 Hyperlipidemia, unspecified: Secondary | ICD-10-CM | POA: Diagnosis present

## 2014-07-23 DIAGNOSIS — E1159 Type 2 diabetes mellitus with other circulatory complications: Secondary | ICD-10-CM | POA: Diagnosis present

## 2014-07-23 DIAGNOSIS — B954 Other streptococcus as the cause of diseases classified elsewhere: Secondary | ICD-10-CM | POA: Diagnosis present

## 2014-07-23 DIAGNOSIS — I129 Hypertensive chronic kidney disease with stage 1 through stage 4 chronic kidney disease, or unspecified chronic kidney disease: Secondary | ICD-10-CM | POA: Diagnosis present

## 2014-07-23 DIAGNOSIS — E119 Type 2 diabetes mellitus without complications: Secondary | ICD-10-CM | POA: Diagnosis present

## 2014-07-23 DIAGNOSIS — R0902 Hypoxemia: Secondary | ICD-10-CM | POA: Diagnosis present

## 2014-07-23 DIAGNOSIS — L02419 Cutaneous abscess of limb, unspecified: Secondary | ICD-10-CM | POA: Diagnosis present

## 2014-07-23 DIAGNOSIS — R6520 Severe sepsis without septic shock: Secondary | ICD-10-CM

## 2014-07-23 DIAGNOSIS — I509 Heart failure, unspecified: Secondary | ICD-10-CM

## 2014-07-23 DIAGNOSIS — F32A Depression, unspecified: Secondary | ICD-10-CM

## 2014-07-23 DIAGNOSIS — J9601 Acute respiratory failure with hypoxia: Secondary | ICD-10-CM

## 2014-07-23 DIAGNOSIS — R0602 Shortness of breath: Secondary | ICD-10-CM

## 2014-07-23 DIAGNOSIS — F329 Major depressive disorder, single episode, unspecified: Secondary | ICD-10-CM

## 2014-07-23 LAB — COMPREHENSIVE METABOLIC PANEL
ALBUMIN: 3.4 g/dL — AB (ref 3.5–5.2)
ALT: 14 U/L (ref 0–35)
AST: 17 U/L (ref 0–37)
Alkaline Phosphatase: 58 U/L (ref 39–117)
Anion gap: 15 (ref 5–15)
BUN: 19 mg/dL (ref 6–23)
CHLORIDE: 97 meq/L (ref 96–112)
CO2: 28 mEq/L (ref 19–32)
CREATININE: 1.12 mg/dL — AB (ref 0.50–1.10)
Calcium: 9.4 mg/dL (ref 8.4–10.5)
GFR calc Af Amer: 73 mL/min — ABNORMAL LOW (ref >90)
GFR calc non Af Amer: 63 mL/min — ABNORMAL LOW (ref >90)
Glucose, Bld: 132 mg/dL — ABNORMAL HIGH (ref 70–99)
Potassium: 4.1 mEq/L (ref 3.7–5.3)
Sodium: 140 mEq/L (ref 137–147)
TOTAL PROTEIN: 6.9 g/dL (ref 6.0–8.3)
Total Bilirubin: 0.9 mg/dL (ref 0.3–1.2)

## 2014-07-23 LAB — SEDIMENTATION RATE: SED RATE: 8 mm/h (ref 0–22)

## 2014-07-23 LAB — TROPONIN I: Troponin I: <0.3 ng/mL (ref <0.30)

## 2014-07-23 LAB — CBC WITH DIFFERENTIAL/PLATELET
Basophils Absolute: 0 10*3/uL (ref 0.0–0.1)
Basophils Relative: 0 % (ref 0–1)
Eosinophils Absolute: 0 10*3/uL (ref 0.0–0.7)
Eosinophils Relative: 0 % (ref 0–5)
HCT: 46.4 % — ABNORMAL HIGH (ref 36.0–46.0)
Hemoglobin: 14.8 g/dL (ref 12.0–15.0)
LYMPHS PCT: 5 % — AB (ref 12–46)
Lymphs Abs: 0.4 10*3/uL — ABNORMAL LOW (ref 0.7–4.0)
MCH: 28.6 pg (ref 26.0–34.0)
MCHC: 31.9 g/dL (ref 30.0–36.0)
MCV: 89.7 fL (ref 78.0–100.0)
Monocytes Absolute: 0.4 10*3/uL (ref 0.1–1.0)
Monocytes Relative: 4 % (ref 3–12)
NEUTROS ABS: 9 10*3/uL — AB (ref 1.7–7.7)
Neutrophils Relative %: 91 % — ABNORMAL HIGH (ref 43–77)
Platelets: 190 10*3/uL (ref 150–400)
RBC: 5.17 MIL/uL — ABNORMAL HIGH (ref 3.87–5.11)
RDW: 16.2 % — ABNORMAL HIGH (ref 11.5–15.5)
WBC: 9.9 10*3/uL (ref 4.0–10.5)

## 2014-07-23 LAB — I-STAT ARTERIAL BLOOD GAS, ED
Acid-Base Excess: 2 mmol/L (ref 0.0–2.0)
BICARBONATE: 28.4 meq/L — AB (ref 20.0–24.0)
O2 Saturation: 97 %
PCO2 ART: 51.4 mmHg — AB (ref 35.0–45.0)
PH ART: 7.351 (ref 7.350–7.450)
PO2 ART: 95 mmHg (ref 80.0–100.0)
TCO2: 30 mmol/L (ref 0–100)

## 2014-07-23 LAB — CK: Total CK: 63 U/L (ref 7–177)

## 2014-07-23 LAB — I-STAT CG4 LACTIC ACID, ED: LACTIC ACID, VENOUS: 4.02 mmol/L — AB (ref 0.5–2.2)

## 2014-07-23 MED ORDER — VANCOMYCIN HCL IN DEXTROSE 1-5 GM/200ML-% IV SOLN
1000.0000 mg | Freq: Once | INTRAVENOUS | Status: AC
Start: 2014-07-23 — End: 2014-07-24
  Administered 2014-07-24: 1000 mg via INTRAVENOUS
  Filled 2014-07-23: qty 200

## 2014-07-23 MED ORDER — ONDANSETRON HCL 4 MG PO TABS: 4.0000 mg | ORAL_TABLET | Freq: Four times a day (QID) | ORAL | Status: DC | PRN

## 2014-07-23 MED ORDER — SODIUM CHLORIDE 0.9 % IV BOLUS (SEPSIS)
1000.0000 mL | Freq: Once | INTRAVENOUS | Status: AC
  Administered 2014-07-23: 1000 mL via INTRAVENOUS

## 2014-07-23 MED ORDER — FENTANYL CITRATE 0.05 MG/ML IJ SOLN
100.0000 ug | Freq: Once | INTRAMUSCULAR | Status: AC
  Administered 2014-07-23: 100 ug via INTRAVENOUS
  Filled 2014-07-23: qty 2

## 2014-07-23 MED ORDER — ONDANSETRON HCL 4 MG/2ML IJ SOLN
4.0000 mg | Freq: Once | INTRAMUSCULAR | Status: AC
  Administered 2014-07-23: 4 mg via INTRAVENOUS
  Filled 2014-07-23: qty 2

## 2014-07-23 MED ORDER — VANCOMYCIN HCL IN DEXTROSE 1-5 GM/200ML-% IV SOLN
1000.0000 mg | Freq: Once | INTRAVENOUS | Status: AC
  Administered 2014-07-23: 1000 mg via INTRAVENOUS
  Filled 2014-07-23: qty 200

## 2014-07-23 MED ORDER — ACETAMINOPHEN 325 MG PO TABS: 650.0000 mg | ORAL_TABLET | Freq: Four times a day (QID) | ORAL | Status: DC | PRN

## 2014-07-23 MED ORDER — VANCOMYCIN HCL IN DEXTROSE 1-5 GM/200ML-% IV SOLN: 1000.0000 mg | Freq: Once | INTRAVENOUS | Status: DC

## 2014-07-23 MED ORDER — ACETAMINOPHEN 325 MG PO TABS
650.0000 mg | ORAL_TABLET | Freq: Four times a day (QID) | ORAL | Status: DC | PRN
  Administered 2014-07-23 – 2014-07-26 (×4): 650 mg via ORAL
  Filled 2014-07-23 (×4): qty 2

## 2014-07-23 MED ORDER — SODIUM CHLORIDE 0.9 % IV SOLN: INTRAVENOUS | Status: DC

## 2014-07-23 MED ORDER — LEVOTHYROXINE SODIUM 150 MCG PO TABS
150.0000 ug | ORAL_TABLET | Freq: Every day | ORAL | Status: DC
  Administered 2014-07-24 – 2014-07-28 (×5): 150 ug via ORAL
  Filled 2014-07-23 (×8): qty 1

## 2014-07-23 MED ORDER — ALBUTEROL SULFATE (2.5 MG/3ML) 0.083% IN NEBU
5.0000 mg | INHALATION_SOLUTION | Freq: Once | RESPIRATORY_TRACT | Status: AC
  Administered 2014-07-23: 5 mg via RESPIRATORY_TRACT
  Filled 2014-07-23: qty 6

## 2014-07-23 MED ORDER — OXYCODONE HCL 5 MG PO TABS
5.0000 mg | ORAL_TABLET | ORAL | Status: DC | PRN
  Administered 2014-07-24: 5 mg via ORAL
  Filled 2014-07-23: qty 1

## 2014-07-23 MED ORDER — VANCOMYCIN HCL 10 G IV SOLR
1500.0000 mg | Freq: Two times a day (BID) | INTRAVENOUS | Status: DC
  Administered 2014-07-24 – 2014-07-25 (×2): 1500 mg via INTRAVENOUS
  Filled 2014-07-23 (×3): qty 1500

## 2014-07-23 MED ORDER — ACETAMINOPHEN 650 MG RE SUPP: 650.0000 mg | Freq: Four times a day (QID) | RECTAL | Status: DC | PRN

## 2014-07-23 MED ORDER — PIPERACILLIN-TAZOBACTAM 3.375 G IVPB
3.3750 g | Freq: Once | INTRAVENOUS | Status: AC
  Administered 2014-07-23: 3.375 g via INTRAVENOUS
  Filled 2014-07-23: qty 50

## 2014-07-23 MED ORDER — IPRATROPIUM BROMIDE 0.02 % IN SOLN
0.5000 mg | Freq: Once | RESPIRATORY_TRACT | Status: AC
  Administered 2014-07-23: 0.5 mg via RESPIRATORY_TRACT
  Filled 2014-07-23: qty 2.5

## 2014-07-23 MED ORDER — ONDANSETRON HCL 4 MG/2ML IJ SOLN
4.0000 mg | Freq: Four times a day (QID) | INTRAMUSCULAR | Status: DC | PRN
Start: 2014-07-23 — End: 2014-07-28
  Administered 2014-07-24 – 2014-07-27 (×3): 4 mg via INTRAVENOUS
  Filled 2014-07-23 (×4): qty 2

## 2014-07-23 MED ORDER — IOHEXOL 350 MG/ML SOLN: 100.0000 mL | Freq: Once | INTRAVENOUS | Status: AC | PRN

## 2014-07-23 MED ORDER — PIPERACILLIN-TAZOBACTAM 3.375 G IVPB
3.3750 g | Freq: Three times a day (TID) | INTRAVENOUS | Status: DC
  Administered 2014-07-24 – 2014-07-25 (×4): 3.375 g via INTRAVENOUS
  Filled 2014-07-23 (×7): qty 50

## 2014-07-23 MED ORDER — GUAIFENESIN ER 600 MG PO TB12
600.0000 mg | ORAL_TABLET | Freq: Two times a day (BID) | ORAL | Status: DC
  Administered 2014-07-23 – 2014-07-28 (×10): 600 mg via ORAL
  Filled 2014-07-23 (×12): qty 1

## 2014-07-23 MED ORDER — ENOXAPARIN SODIUM 150 MG/ML ~~LOC~~ SOLN
195.0000 mg | Freq: Two times a day (BID) | SUBCUTANEOUS | Status: DC
  Administered 2014-07-24: 195 mg via SUBCUTANEOUS
  Filled 2014-07-23 (×3): qty 2

## 2014-07-23 MED ORDER — SODIUM CHLORIDE 0.9 % IV BOLUS (SEPSIS)
1000.0000 mL | Freq: Once | INTRAVENOUS | Status: AC
Start: 2014-07-23 — End: 2014-07-23
  Administered 2014-07-23: 1000 mL via INTRAVENOUS

## 2014-07-23 MED ORDER — SODIUM CHLORIDE 0.9 % IV SOLN
INTRAVENOUS | Status: DC
  Administered 2014-07-23: via INTRAVENOUS

## 2014-07-23 MED ORDER — POLYETHYLENE GLYCOL 3350 17 G PO PACK
17.0000 g | PACK | Freq: Every day | ORAL | Status: DC | PRN
  Filled 2014-07-23: qty 1

## 2014-07-23 MED ORDER — SODIUM CHLORIDE 0.9 % IJ SOLN
3.0000 mL | Freq: Two times a day (BID) | INTRAMUSCULAR | Status: DC
  Administered 2014-07-24 – 2014-07-28 (×8): 3 mL via INTRAVENOUS

## 2014-07-23 MED ORDER — IBUPROFEN 800 MG PO TABS
800.0000 mg | ORAL_TABLET | Freq: Once | ORAL | Status: AC
  Administered 2014-07-23: 800 mg via ORAL
  Filled 2014-07-23: qty 1

## 2014-07-23 NOTE — ED Notes (Signed)
Attempted report x1. 

## 2014-07-23 NOTE — ED Notes (Signed)
The patient said she has been seen here and at [facility] for the same thing.  The patient has been hospitalized for the same thing in the past.  The patient said she when she was seen at [facility] she had an allergic reaction to the Doxycycline.  She presents today with redness, swelling and pain to the left leg that radiates up her leg.  She also says she has had a fever.  Patient is here to be evaluated.

## 2014-07-23 NOTE — Consult Note (Signed)
ANTICOAGULATION/ANTIBIOTIC CONSULT NOTE - Initial Consult  Pharmacy Consult for Lovenox; Vancomycin/Zosyn Indication: rule out PE; sepsis  Allergies  Allergen Reactions  . Aspirin     REACTION: throat swelling, hives  . Doxycycline Other (See Comments)    Abdominal pain  . Niaspan [Niacin Er]     Caused flushing    Patient Measurements: Height: 5\' 2"  (157.5 cm) Weight: 430 lb (195.047 kg) IBW/kg (Calculated) : 50.1   Vital Signs: Temp: 99.2 F (37.3 C) (08/26 2153) Temp src: Oral (08/26 2153) BP: 95/41 mmHg (08/26 2032) Pulse Rate: 80 (08/26 1944)  Labs:  Recent Labs  07/23/14 1554 07/23/14 1845  HGB 14.8  --   HCT 46.4*  --   PLT 190  --   CREATININE  --  1.12*    Estimated Creatinine Clearance: 120.8 ml/min (by C-G formula based on Cr of 1.12).   Medical History: Past Medical History  Diagnosis Date  . Morbid obesity   . Hyperlipidemia   . Diabetes mellitus   . Chronic kidney disease   . GERD (gastroesophageal reflux disease)   . Sepsis 11/2012    Secondary to cellulitis  . Hypothyroidism   . Hypertension   . Anemia   . Recurrent cellulitis of lower leg     LLE, venous insuff  . OSA (obstructive sleep apnea)   . Obesity hypoventilation syndrome   . Morbid obesity with BMI of 70 and over, adult   . CARDIOMYOPATHY, DILATED   . Depression   . Venous insufficiency of leg    Assessment: 33yo morbidly obese female presents to the ED with redness and swelling to her left leg (has been treated at [facility] for this in the past), fever, hypotension, and SOB. She will begin empiric vancomycin and zosyn for probable sepsis. CT chest could not be done to rule out PE due to patient body habitus. She will begin full dose lovenox - VQ pending. Wt = 195kg. She has CKD but sCr appears stable at 1.  Received 1g vanc and 3.375g zosyn in the ED.  Goal of Therapy:  Anti-Xa level 0.6-1 units/ml 4hrs after LMWH dose given Monitor platelets by anticoagulation protocol:  Yes Vancomycin trough 15-20   Plan:  1) Vancomycin 1g IV x 1 to complete 2g loading dose then 1500mg  IV q12 2) Zosyn 3.375g IV q8 (4 hour infusion) 3) Lovenox 195mg  sq q12 4) Follow renal function, cultures, LOT, level if needed 5) Follow up VQ scan 6) CBC q72 while on full dose lovenox  Deboraha Sprang 07/23/2014,10:33 PM

## 2014-07-23 NOTE — ED Notes (Signed)
Lab results given to [place].

## 2014-07-23 NOTE — H&P (Signed)
[facility] History and Physical  Patient: Alexandra Beck  ZJQ:[id number]  DOB: 1981-04-29  DOS: the patient was seen and examined on 07/23/2014 PCP: Gwendolyn Grant, MD  Chief Complaint: Left leg pain and redness  HPI: Alexandra Beck is a 33 y.o. female with Past medical history of morbid obesity, sleep apnea on CPAP, diabetes mellitus, GERD, chronic kidney disease, recurrent cellulitis, hypothyroidism, hypertension, venous insufficiency. The patient presented with complaints of left leg pain and swelling. She mentions that she was at her baseline yesterday night when she went to sleep and when she woke up this morning she had some pain on the back of her left knee. The pain was dull ache. She also had some pain in her left hip while she was walking. As she was feeling tired and more lethargic than her baseline she checked her temperature twice and the second time she found that her temperature was elevated. Later on she started noticing some redness and tingling on the left leg and noticed that it has been progressively worsening. She started having worsening fever and cold clammy hands and therefore came to the hospital. She has been having progressively worsening shortness of breath over last few month and denies any recent worsening in her symptoms. She complains of chest tightness when she is walking around at her baseline. She mentions the distance has been more shorter to reproduce her symptoms over that time. She starts having some wheezing as well when she is walking. She denies any cough. She denies any chest pain, orthopnea or PND at the time of my evaluation. She complains of bilateral leg swelling with left leg worsened than right at this point. She mentions she has on and off leg swelling over last few months. She mentions is compliant with all her medication and there is no recent change in her medication.  The patient is coming from home. And at her baseline independent for most  of her ADL.  Review of Systems: as mentioned in the history of present illness.  A Comprehensive review of the other systems is negative.  Past Medical History  Diagnosis Date  . Morbid obesity   . Hyperlipidemia   . Diabetes mellitus   . Chronic kidney disease   . GERD (gastroesophageal reflux disease)   . Sepsis 11/2012    Secondary to cellulitis  . Hypothyroidism   . Hypertension   . Anemia   . Recurrent cellulitis of lower leg     LLE, venous insuff  . OSA (obstructive sleep apnea)   . Obesity hypoventilation syndrome   . Morbid obesity with BMI of 70 and over, adult   . CARDIOMYOPATHY, DILATED   . Depression   . Venous insufficiency of leg    Past Surgical History  Procedure Laterality Date  . Wisdom tooth extraction     Social History:  reports that she quit smoking about 3 years ago. Her smoking use included Cigarettes. She smoked 0.00 packs per day for 10 years. She has never used smokeless tobacco. She reports that she does not drink alcohol or use illicit drugs.  Allergies  Allergen Reactions  . Aspirin     REACTION: throat swelling, hives  . Doxycycline Other (See Comments)    Abdominal pain  . Niaspan [Niacin Er]     Caused flushing    Family History  Problem Relation Age of Onset  . Hypertension Mother   . Hypertension Father   . Heart disease Father     before  age 81    Prior to Admission medications   Medication Sig Start Date End Date Taking? Authorizing Provider  amLODipine (NORVASC) 5 MG tablet Take 5 mg by mouth daily.   Yes Historical Provider, MD  hydrochlorothiazide (HYDRODIURIL) 25 MG tablet Take 25 mg by mouth daily.   Yes Historical Provider, MD  levothyroxine (SYNTHROID, LEVOTHROID) 150 MCG tablet Take 150 mcg by mouth daily before breakfast.   Yes Historical Provider, MD  metoprolol (LOPRESSOR) 50 MG tablet Take 50 mg by mouth 2 (two) times daily.   Yes Historical Provider, MD  rosuvastatin (CRESTOR) 10 MG tablet Take 10 mg by mouth  daily.   Yes Historical Provider, MD    Physical Exam: Filed Vitals:   07/23/14 1914 07/23/14 1944 07/23/14 2032 07/23/14 2153  BP: '95/51 96/43 95/41 '   Pulse:  80    Temp:  100.2 F (37.9 C)  99.2 F (37.3 C)  TempSrc:  Oral  Oral  Resp: '23 18 13   ' Height:      Weight:      SpO2: 96% 93% 98%     General: Alert, Awake and Oriented to Time, Place and Person. Appear in mild distress Eyes: PERRL ENT: Oral Mucosa clear moist. Neck: Difficult to assess JVD Cardiovascular: S1 and S2 Present, distant heart sounds, no Murmur, Peripheral Pulses Present Respiratory: Bilateral Air entry equal and Decreased, Clear to Auscultation, no Crackles, no wheezes Abdomen: Bowel Sound Present, Soft and Non tender Skin: Left leg redness with warmth and tenderness no other Rash Extremities: Bilateral Pedal edema, no calf tenderness, tenderness at the left knee with decreased range of motion, tenderness at the left ankle, Neurologic: Grossly no focal neuro deficit.  Labs on Admission:  CBC:  Recent Labs Lab 07/23/14 1554  WBC 9.9  NEUTROABS 9.0*  HGB 14.8  HCT 46.4*  MCV 89.7  PLT 190    CMP     Component Value Date/Time   NA 140 07/23/2014 1845   K 4.1 07/23/2014 1845   CL 97 07/23/2014 1845   CO2 28 07/23/2014 1845   GLUCOSE 132* 07/23/2014 1845   BUN 19 07/23/2014 1845   CREATININE 1.12* 07/23/2014 1845   CALCIUM 9.4 07/23/2014 1845   CALCIUM 9.6 06/26/2007 0000   PROT 6.9 07/23/2014 1845   ALBUMIN 3.4* 07/23/2014 1845   AST 17 07/23/2014 1845   ALT 14 07/23/2014 1845   ALKPHOS 58 07/23/2014 1845   BILITOT 0.9 07/23/2014 1845   GFRNONAA 63* 07/23/2014 1845   GFRAA 73* 07/23/2014 1845    No results found for this basename: LIPASE, AMYLASE,  in the last 168 hours No results found for this basename: AMMONIA,  in the last 168 hours  No results found for this basename: CKTOTAL, CKMB, CKMBINDEX, TROPONINI,  in the last 168 hours BNP (last 3 results) No results found for this basename: PROBNP,   in the last 8760 hours  Radiological Exams on Admission: Dg Chest 2 View  07/23/2014   CLINICAL DATA:  Fever and shortness of breath.  Left leg cellulitis.  EXAM: CHEST  2 VIEW  COMPARISON:  PA and lateral chest 09/16/2011.  FINDINGS: The lungs are clear. Heart size is enlarged. No pneumothorax or pleural effusion. No focal bony abnormality.  IMPRESSION: Cardiomegaly without acute disease.   Electronically Signed   By: Inge Rise M.D.   On: 07/23/2014 18:12   Ct Angio Chest Pe W/cm &/or Wo Cm  07/23/2014   CLINICAL DATA:  Hypoxia with cough and  shortness of breath.  EXAM: CT ANGIOGRAPHY CHEST WITH CONTRAST  TECHNIQUE: Multidetector CT imaging of the chest was performed using the standard protocol during bolus administration of intravenous contrast. Multiplanar CT image reconstructions and MIPs were obtained to evaluate the vascular anatomy.  CONTRAST:  70 cc Omnipaque 350  COMPARISON:  None.  FINDINGS: Markedly degraded study secondary to body habitus and patient inability to reproducibly breath hold. There is no evidence for thrombus in the main pulmonary outflow tract. Given the motion artifact and image noise, the study is nondiagnostic for detection of pulmonary embolus in the main pulmonary arteries and more distal branches.  No thoracic aortic aneurysm. The heart is enlarged. No pericardial or pleural effusion. No evidence for mediastinal lymphadenopathy.  Lung volumes are low and fine detail is distorted by breathing motion. No dense airspace consolidation. Atelectasis is seen in the lower lungs bilaterally.  Bone windows reveal no worrisome lytic or sclerotic osseous lesions.  Review of the MIP images confirms the above findings.  IMPRESSION: Nondiagnostic exam for acute pulmonary embolus secondary to body habitus and inability to breath hold.  I personally discussed this study by telephone with the PA caring for the patient, Delos Haring, at 2047 hrs on 07/23/2014.   Electronically Signed    By: Misty Stanley M.D.   On: 07/23/2014 20:47   EKG: Independently reviewed. normal sinus rhythm, nonspecific ST and T waves changes. Assessment/Plan Principal Problem:   Sepsis Active Problems:   HYPERTENSION   Morbid obesity with BMI of 70 and over, adult   OSA (obstructive sleep apnea)   CHF (congestive heart failure)   Diabetes mellitus type 2 in obese   Hypothyroidism   GERD (gastroesophageal reflux disease)   Venous insufficiency of leg   Hypoxia   1. Sepsis The patient is presenting with complaints of left leg redness warmth tenderness along with fever. She is found to have elevated lactic acid level, hypotension, fever, with evidence of cellulitis. She mentions that she has progressive cellulitis in the past and has been treated with vancomycin and Zosyn last year. Cellulitis in the past was thought to be secondary to venous stasis edema as well as chronic dryness of her legs causing cracks which is still present. At present the patient will be brought in treated with IV vancomycin and IV Zosyn. Her blood pressure has improved up toward liter of bolus would continue her on IV hydration. Follow the cultures. Check ESR, CRP, CPK, x-ray of the knee as well as foot.  2. Acute hypoxic respiratory failure. Patient at her baseline has sleep apnea and uses CPAP but does not use any oxygen. She has chronic shortness of breath on exertion which is progressively worsening over last few months. She denies any acute worsening over last few days. At this point a CT chest was performed which was nondiagnostic to rule out any pulmonary embolism. With hypoxia, hypotension, leg swelling and shortness of breath with limited mobility due to body habitus the patient is at high risk for pulmonary embolism and therefore the patient was recommended for an empiric therapeutic anticoagulation which patient agreed with at present I would provide her with Lovenox subcutaneously. We will perform a VQ scan for  definitive diagnostic workup as well as lower extremity Doppler. I would also check ABG. CT scan of the chest is negative for any pneumonia, pneumothorax or any other acute abnormality. Would continue to monitor. Continue with BiPAP each bedtime patient uses CPAP each bedtime.  3. Hypertension. In view of  her low blood pressure according of all her antihypertensive medications.  4. Hypothyroidism. Continue with Synthroid. Check TSH and cortisol level in the morning.  5. Ex is no dyspnea. Check echocardiogram in the morning for follow serial troponins  DVT Prophylaxis: on therapeutic anticoagulation Nutrition: N.p.o. after midnight  Code Status: Full  Family Communication: Family was present at bedside, opportunity was given to ask question and all questions were answered satisfactorily at the time of interview. Disposition: Admitted to inpatient in step-down unit.  Author: Berle Mull, MD [facility] Pager: [phone] 07/23/2014, 10:26 PM    If 7PM-7AM, please contact night-coverage [email or web address] Password [email or web address]  **Disclaimer: This note may have been dictated with voice recognition software. Similar sounding words can inadvertently be transcribed and this note may contain transcription errors which may not have been corrected upon publication of note.**

## 2014-07-23 NOTE — ED Provider Notes (Signed)
CSN: [id number]     Arrival date & time 07/23/14  1517 History   First MD Initiated Contact with Patient 07/23/14 1612     Chief Complaint  Patient presents with  . Cellulitis    The patient said she has been seen here and at [facility] for the same thing.  The patient has been hospitalized for the same thing in the past.     (Consider location/radiation/quality/duration/timing/severity/associated sxs/prior Treatment) HPI  PCP: Dr. Gwendolyn Grant  Patient to the ER with PMH of morbid obesity, diabetes, chronic kidney disease, complaints of shortness of breath for 3 months and left lower extremity cellulitis. She reports having this same infection to the left lower leg in January of 2014 and got admitted for IV Vancomycin. She did not notice that any rash was there until this morning. She now has fever of 102.7 and feels tired. Upon arrival it was noted that her O2 levels were 88% on room air and she was started on 2L nasal cannula, this improved her 02 to 95%. She reports for unknown reason she has been more SOB than normal. Denies being on oxygen at home, reports has had cough with phlegm that is worse in the morning.  Past Medical History  Diagnosis Date  . Morbid obesity   . Hyperlipidemia   . Diabetes mellitus   . Chronic kidney disease   . GERD (gastroesophageal reflux disease)   . Sepsis 11/2012    Secondary to cellulitis  . Hypothyroidism   . Hypertension   . Anemia   . Recurrent cellulitis of lower leg     LLE, venous insuff  . OSA (obstructive sleep apnea)   . Obesity hypoventilation syndrome   . Morbid obesity with BMI of 70 and over, adult   . CARDIOMYOPATHY, DILATED   . Depression   . Venous insufficiency of leg    Past Surgical History  Procedure Laterality Date  . Wisdom tooth extraction     Family History  Problem Relation Age of Onset  . Hypertension Mother   . Hypertension Father   . Heart disease Father     before age 20   History  Substance Use  Topics  . Smoking status: Former Smoker -- 10 years    Types: Cigarettes    Quit date: 01/21/2011  . Smokeless tobacco: Never Used     Comment: social smoker-- 1 pack per 3 weeks.  . Alcohol Use: No   OB History   Grav Para Term Preterm Abortions TAB SAB Ect Mult Living                 Review of Systems  Constitutional: Positive for fever and fatigue.  Respiratory: Positive for shortness of breath.   Musculoskeletal: Positive for joint swelling.  Skin: Positive for rash.       Allergies  Aspirin; Doxycycline; and Niaspan  Home Medications   Prior to Admission medications   Medication Sig Start Date End Date Taking? Authorizing Provider  amLODipine (NORVASC) 5 MG tablet Take 5 mg by mouth daily.   Yes Historical Provider, MD  hydrochlorothiazide (HYDRODIURIL) 25 MG tablet Take 25 mg by mouth daily.   Yes Historical Provider, MD  levothyroxine (SYNTHROID, LEVOTHROID) 150 MCG tablet Take 150 mcg by mouth daily before breakfast.   Yes Historical Provider, MD  metoprolol (LOPRESSOR) 50 MG tablet Take 50 mg by mouth 2 (two) times daily.   Yes Historical Provider, MD  rosuvastatin (CRESTOR) 10 MG tablet Take 10 mg  by mouth daily.   Yes Historical Provider, MD   BP 95/41  Pulse 80  Temp(Src) 100.2 F (37.9 C) (Oral)  Resp 13  Ht 5\' 2"  (1.575 m)  Wt 430 lb (195.047 kg)  BMI 78.63 kg/m2  SpO2 98%  LMP 07/09/2014 Physical Exam  Nursing note and vitals reviewed. Constitutional: She appears well-developed and well-nourished. No distress.  HENT:  Head: Normocephalic and atraumatic.  Eyes: Pupils are equal, round, and reactive to light.  Neck: Normal range of motion. Neck supple.  Cardiovascular: Normal rate and regular rhythm.   Pulmonary/Chest: Effort normal.  Increased effort of breathing, mildly decreased breath sounds in all lung fields  Abdominal: Soft.  Musculoskeletal:  Symmetrical pedal pulses palpable  Neurological: She is alert.  Skin: Skin is warm. Rash noted.  She is diaphoretic.       ED Course  Procedures (including critical care time) Labs Review Labs Reviewed  CBC WITH DIFFERENTIAL - Abnormal; Notable for the following:    RBC 5.17 (*)    HCT 46.4 (*)    RDW 16.2 (*)    Neutrophils Relative % 91 (*)    Neutro Abs 9.0 (*)    Lymphocytes Relative 5 (*)    Lymphs Abs 0.4 (*)    All other components within normal limits  COMPREHENSIVE METABOLIC PANEL - Abnormal; Notable for the following:    Glucose, Bld 132 (*)    Creatinine, Ser 1.12 (*)    Albumin 3.4 (*)    GFR calc non Af Amer 63 (*)    GFR calc Af Amer 73 (*)    All other components within normal limits  I-STAT CG4 LACTIC ACID, ED - Abnormal; Notable for the following:    Lactic Acid, Venous 4.02 (*)    All other components within normal limits  CULTURE, BLOOD (ROUTINE X 2)  CULTURE, BLOOD (ROUTINE X 2)    Imaging Review Dg Chest 2 View  07/23/2014   CLINICAL DATA:  Fever and shortness of breath.  Left leg cellulitis.  EXAM: CHEST  2 VIEW  COMPARISON:  PA and lateral chest 09/16/2011.  FINDINGS: The lungs are clear. Heart size is enlarged. No pneumothorax or pleural effusion. No focal bony abnormality.  IMPRESSION: Cardiomegaly without acute disease.   Electronically Signed   By: Inge Rise M.D.   On: 07/23/2014 18:12   Ct Angio Chest Pe W/cm &/or Wo Cm  07/23/2014   CLINICAL DATA:  Hypoxia with cough and shortness of breath.  EXAM: CT ANGIOGRAPHY CHEST WITH CONTRAST  TECHNIQUE: Multidetector CT imaging of the chest was performed using the standard protocol during bolus administration of intravenous contrast. Multiplanar CT image reconstructions and MIPs were obtained to evaluate the vascular anatomy.  CONTRAST:  70 cc Omnipaque 350  COMPARISON:  None.  FINDINGS: Markedly degraded study secondary to body habitus and patient inability to reproducibly breath hold. There is no evidence for thrombus in the main pulmonary outflow tract. Given the motion artifact and image noise,  the study is nondiagnostic for detection of pulmonary embolus in the main pulmonary arteries and more distal branches.  No thoracic aortic aneurysm. The heart is enlarged. No pericardial or pleural effusion. No evidence for mediastinal lymphadenopathy.  Lung volumes are low and fine detail is distorted by breathing motion. No dense airspace consolidation. Atelectasis is seen in the lower lungs bilaterally.  Bone windows reveal no worrisome lytic or sclerotic osseous lesions.  Review of the MIP images confirms the above findings.  IMPRESSION: Nondiagnostic  exam for acute pulmonary embolus secondary to body habitus and inability to breath hold.  I personally discussed this study by telephone with the PA caring for the patient, Delos Haring, at 2047 hrs on 07/23/2014.   Electronically Signed   By: Misty Stanley M.D.   On: 07/23/2014 20:47     EKG Interpretation None      MDM   Final diagnoses:  Hypoxic  SOB (shortness of breath)  Cellulitis of left lower extremity  Fever, unspecified fever cause    Patient has fever, fatigue, low oxygen levels, with infection to left lower extremity. Will work up as potential sepsis- pulmonary vs lower extremity main source is unknown at this time. Will cover with Vancomycin. Blood cultures, lactate, cbc, cmp pending. Tylenol given for fever. 1 L normal saline started.  4:55 pm Patient lactate has come back elevated, chest xray still pending. She will need admission when work-up has resulted. Dr. Colin Rhein made aware that patient appears sick and has had face to face contact.  8:46 pm The radiologist called and said that the CT angio of the chest is not diagnostic. The patient is too large and had too much diaphragm motion. He recommends getting a VQ scan as he feels that this would be more helpful. Patients fever has improved after Motrin and Tylenol. She has received 2 L of saline but her blood pressure has continued to decline in the ED and is soft around  95/41. She is maintaining her sats on her oxygen and is tolerating pain medication well. She has a positive lactate, no white count, hypoxic. Dr. Colin Rhein aware of CT angio concerns and that plan is admission.   CRITICAL CARE Performed by: Linus Mako Total critical care time: 40 Critical care time was exclusive of separately billable procedures and treating other patients. Critical care was necessary to treat or prevent imminent or life-threatening deterioration. Critical care was time spent personally by me on the following activities: development of treatment plan with patient and/or surrogate as well as nursing, discussions with consultants, evaluation of patient's response to treatment, examination of patient, obtaining history from patient or surrogate, ordering and performing treatments and interventions, ordering and review of laboratory studies, ordering and review of radiographic studies, pulse oximetry and re-evaluation of patient's condition.   Patient admitted to [facility], stepdown, team 10, [place]  Zosyn, ABG, and another fluid bolus ordered. Admitting physician in pod e currently to admit patient.  Linus Mako, PA-C 07/23/14 2110

## 2014-07-23 NOTE — ED Notes (Signed)
Two nurses attempted IV with no success. IV team paged.

## 2014-07-24 ENCOUNTER — Encounter (HOSPITAL_COMMUNITY): Payer: Self-pay

## 2014-07-24 ENCOUNTER — Inpatient Hospital Stay (HOSPITAL_COMMUNITY): Payer: BC Managed Care – PPO

## 2014-07-24 DIAGNOSIS — R0902 Hypoxemia: Secondary | ICD-10-CM

## 2014-07-24 DIAGNOSIS — M7989 Other specified soft tissue disorders: Secondary | ICD-10-CM

## 2014-07-24 LAB — C-REACTIVE PROTEIN: CRP: 6.5 mg/dL — ABNORMAL HIGH (ref <0.60)

## 2014-07-24 LAB — MRSA PCR SCREENING: MRSA BY PCR: POSITIVE — AB

## 2014-07-24 LAB — TSH: TSH: 1.39 u[IU]/mL (ref 0.350–4.500)

## 2014-07-24 LAB — TROPONIN I

## 2014-07-24 LAB — CORTISOL-AM, BLOOD: CORTISOL - AM: 27.6 ug/dL — AB (ref 4.3–22.4)

## 2014-07-24 MED ORDER — ATORVASTATIN CALCIUM 10 MG PO TABS
10.0000 mg | ORAL_TABLET | Freq: Every day | ORAL | Status: DC
  Administered 2014-07-24 – 2014-07-27 (×4): 10 mg via ORAL
  Filled 2014-07-24 (×5): qty 1

## 2014-07-24 MED ORDER — SODIUM CHLORIDE 0.9 % IV SOLN
INTRAVENOUS | Status: DC
  Administered 2014-07-24 – 2014-07-25 (×2): via INTRAVENOUS

## 2014-07-24 MED ORDER — TECHNETIUM TO 99M ALBUMIN AGGREGATED
6.0000 | Freq: Once | INTRAVENOUS | Status: AC | PRN
  Administered 2014-07-24: 6 via INTRAVENOUS

## 2014-07-24 MED ORDER — TECHNETIUM TC 99M DIETHYLENETRIAME-PENTAACETIC ACID: 40.0000 | Freq: Once | INTRAVENOUS | Status: AC | PRN

## 2014-07-24 MED ORDER — CHLORHEXIDINE GLUCONATE CLOTH 2 % EX PADS
6.0000 | MEDICATED_PAD | Freq: Every day | CUTANEOUS | Status: DC
  Administered 2014-07-25 – 2014-07-28 (×4): 6 via TOPICAL

## 2014-07-24 MED ORDER — ENOXAPARIN SODIUM 150 MG/ML ~~LOC~~ SOLN
195.0000 mg | Freq: Two times a day (BID) | SUBCUTANEOUS | Status: DC
  Filled 2014-07-24 (×2): qty 2

## 2014-07-24 MED ORDER — CETYLPYRIDINIUM CHLORIDE 0.05 % MT LIQD
7.0000 mL | Freq: Two times a day (BID) | OROMUCOSAL | Status: DC
  Administered 2014-07-25 – 2014-07-28 (×6): 7 mL via OROMUCOSAL

## 2014-07-24 MED ORDER — OXYCODONE HCL 5 MG PO TABS
5.0000 mg | ORAL_TABLET | ORAL | Status: DC | PRN
  Administered 2014-07-24 – 2014-07-28 (×5): 10 mg via ORAL
  Filled 2014-07-24 (×5): qty 2

## 2014-07-24 MED ORDER — IBUPROFEN 400 MG PO TABS
400.0000 mg | ORAL_TABLET | Freq: Four times a day (QID) | ORAL | Status: DC | PRN
  Administered 2014-07-24: 400 mg via ORAL
  Filled 2014-07-24: qty 1

## 2014-07-24 MED ORDER — IBUPROFEN 100 MG/5ML PO SUSP
400.0000 mg | Freq: Four times a day (QID) | ORAL | Status: DC | PRN
  Filled 2014-07-24: qty 20

## 2014-07-24 MED ORDER — ENOXAPARIN SODIUM 150 MG/ML ~~LOC~~ SOLN
195.0000 mg | Freq: Two times a day (BID) | SUBCUTANEOUS | Status: DC
  Administered 2014-07-24: 195 mg via SUBCUTANEOUS
  Filled 2014-07-24 (×2): qty 2

## 2014-07-24 MED ORDER — ENOXAPARIN SODIUM 100 MG/ML ~~LOC~~ SOLN
100.0000 mg | SUBCUTANEOUS | Status: DC
  Administered 2014-07-25 – 2014-07-28 (×4): 100 mg via SUBCUTANEOUS
  Filled 2014-07-24 (×4): qty 1

## 2014-07-24 MED ORDER — MUPIROCIN 2 % EX OINT
1.0000 "application " | TOPICAL_OINTMENT | Freq: Two times a day (BID) | CUTANEOUS | Status: DC
  Administered 2014-07-24 – 2014-07-28 (×8): 1 via NASAL
  Filled 2014-07-24 (×2): qty 22

## 2014-07-24 NOTE — Progress Notes (Signed)
Utilization review completed.  

## 2014-07-24 NOTE — Progress Notes (Signed)
  Echocardiogram 2D Echocardiogram has been performed.  Alexandra Beck FRANCES 07/24/2014, 4:28 PM

## 2014-07-24 NOTE — Progress Notes (Signed)
Patient Demographics  Alexandra Beck, is a 33 y.o. female, DOB - 11/03/1981, SHF:[id number]  Admit date - 07/23/2014   Admitting Physician Berle Mull, MD  Outpatient Primary MD for the patient is Gwendolyn Grant, MD  LOS - 1   Chief Complaint  Patient presents with  . Cellulitis    The patient said she has been seen here and at [facility] for the same thing.  The patient has been hospitalized for the same thing in the past.        Subjective:   Alexandra Beck today has, No headache, No chest pain, No abdominal pain - No Nausea, No new weakness tingling or numbness, No Cough - SOB. Continues to have minimal pain and discomfort in the right leg.  Assessment & Plan    1. Sepsis due to cellulitis in the right lower extremity. Agree with empiric IV antibiotics which are vancomycin and Zosyn, will follow final blood culture results and adjust antibiotics as needed. Right lower extremity venous duplex, ABI pending. Have requested the technician to evaluate right leg soft tissue to rule out an abscess.   2. Acute on chronic shortness of breath. Worsening exercise capacity. Likely due to severe underlying morbid obesity with BMI of 70. CT of the chest inconclusive to rule out PE, she continues to be on Lovenox for now. UT venous duplex, VQ scan and echogram pending. Negative serial troponins. Diagnostic options limited due to her body habitus. D-dimer was 10 however in the light of cellulitis question significance.    3. Hypothyroidism. Continue home dose Synthroid , stable baseline TSH of 1.3.    4. Dyslipidemia continue home dose statin.    5. Essential hypertension - pressure soft hold HCTZ, Lopressor and Norvasc.    6. OSA. CPAP and in bed. Patient encouraged to increase activity and sit in the  recliner during the daytime.        Code Status: Full  Family Communication: None present  Disposition Plan: To be decided   Procedures CT angiogram chest inconclusive for PE, VQ scan, echogram, venous duplex right lower extremity, ABI right lower extremity   Consults     Medications  Scheduled Meds: . enoxaparin (LOVENOX) injection  195 mg Subcutaneous Q12H  . guaiFENesin  600 mg Oral BID  . levothyroxine  150 mcg Oral QAC breakfast  . piperacillin-tazobactam (ZOSYN)  IV  3.375 g Intravenous 3 times per day  . sodium chloride  3 mL Intravenous Q12H  . vancomycin  1,500 mg Intravenous Q12H   Continuous Infusions: . sodium chloride 100 mL/hr at 07/24/14 0900   PRN Meds:.acetaminophen, acetaminophen, acetaminophen, ondansetron (ZOFRAN) IV, ondansetron, oxyCODONE, polyethylene glycol  DVT Prophylaxis  Lovenox   Lab Results  Component Value Date   PLT 190 07/23/2014    Antibiotics     Anti-infectives   Start     Dose/Rate Route Frequency Ordered Stop   07/24/14 1800  vancomycin (VANCOCIN) 1,500 mg in sodium chloride 0.9 % 500 mL IVPB     1,500 mg 250 mL/hr over 120 Minutes Intravenous Every 12 hours 07/23/14 2247     07/23/14 2300  vancomycin (VANCOCIN) IVPB 1000 mg/200 mL premix     1,000 mg 200 mL/hr over 60 Minutes Intravenous  Once 07/23/14 2247 07/24/14 0103   07/23/14 2300  piperacillin-tazobactam (ZOSYN) IVPB 3.375 g     3.375 g 12.5 mL/hr over 240 Minutes Intravenous 3 times per day 07/23/14 2247     07/23/14 2115  piperacillin-tazobactam (ZOSYN) IVPB 3.375 g     3.375 g 12.5 mL/hr over 240 Minutes Intravenous  Once 07/23/14 2106 07/24/14 0130   07/23/14 1700  vancomycin (VANCOCIN) IVPB 1000 mg/200 mL premix  Status:  Discontinued     1,000 mg 200 mL/hr over 60 Minutes Intravenous  Once 07/23/14 1656 07/23/14 1657   07/23/14 1700  vancomycin (VANCOCIN) IVPB 1000 mg/200 mL premix    Comments:  DO NOT START UNTIL BLOOD CULTURES HAVE BEEN DRAWN.   1,000  mg 200 mL/hr over 60 Minutes Intravenous  Once 07/23/14 1657 07/23/14 1917          Objective:   Filed Vitals:   07/24/14 0800 07/24/14 0824 07/24/14 0900 07/24/14 1000  BP: 125/59  93/42 100/46  Pulse: 71 72 70 67  Temp:      TempSrc:      Resp: 16 20 21 14   Height:      Weight:      SpO2: 89% 96% 88% 96%    Wt Readings from Last 3 Encounters:  07/23/14 195.047 kg (430 lb)  08/14/13 182.709 kg (402 lb 12.8 oz)  08/02/13 181.348 kg (399 lb 12.8 oz)     Intake/Output Summary (Last 24 hours) at 07/24/14 1220 Last data filed at 07/24/14 1037  Gross per 24 hour  Intake 2933.33 ml  Output    450 ml  Net 2483.33 ml     Physical Exam  Awake Alert, Oriented X 3, No new F.N deficits, Normal affect [place].AT,PERRAL Supple Neck,No JVD, No cervical lymphadenopathy appriciated.  Symmetrical Chest wall movement, Good air movement bilaterally, CTAB RRR,No Gallops,Rubs or new Murmurs, No Parasternal Heave +ve B.Sounds, Abd Soft, No tenderness, No organomegaly appriciated, No rebound - guarding or rigidity. No Cyanosis, Clubbing or edema, No new Rash or bruise , right leg has frank cellulitis below the knee, minimal fluctuance behind the left calf   Data Review   Micro Results Recent Results (from the past 240 hour(s))  CULTURE, BLOOD (ROUTINE X 2)     Status: None   Collection Time    07/23/14  5:05 PM      Result Value Ref Range Status   Specimen Description BLOOD ARM LEFT   Final   Special Requests BOTTLES DRAWN AEROBIC AND ANAEROBIC 10CC   Final   Culture  Setup Time     Final   Value: 07/23/2014 20:37     Performed at [facility]   Culture     Final   Value: GRAM POSITIVE COCCI IN CHAINS     Note: Gram Stain Report Called to,Read Back By and Verified With: JOANNA L@8 :50AM ON 07/24/14 BY DANTS     Performed at [facility]   Report Status PENDING   Incomplete  CULTURE, BLOOD (ROUTINE X 2)     Status: None   Collection Time    07/23/14  5:15 PM       Result Value Ref Range Status   Specimen Description BLOOD ARM RIGHT   Final   Special Requests BOTTLES DRAWN AEROBIC AND ANAEROBIC 10CC   Final   Culture  Setup Time     Final   Value: 07/23/2014 20:38     Performed at [facility]  Final   Value:        BLOOD CULTURE RECEIVED NO GROWTH TO DATE CULTURE WILL BE HELD FOR 5 DAYS BEFORE ISSUING A FINAL NEGATIVE REPORT     Performed at [facility]   Report Status PENDING   Incomplete  MRSA PCR SCREENING     Status: Abnormal   Collection Time    07/24/14 12:07 AM      Result Value Ref Range Status   MRSA by PCR POSITIVE (*) NEGATIVE Final   Comment:            The GeneXpert MRSA Assay (FDA     approved for NASAL specimens     only), is one component of a     comprehensive MRSA colonization     surveillance program. It is not     intended to diagnose MRSA     infection nor to guide or     monitor treatment for     MRSA infections.     RESULT CALLED TO, READ BACK BY AND VERIFIED WITHEllison Hughs RN [id number] AT [phone] [facility]    Radiology Reports Dg Chest 2 View  07/23/2014   CLINICAL DATA:  Fever and shortness of breath.  Left leg cellulitis.  EXAM: CHEST  2 VIEW  COMPARISON:  PA and lateral chest 09/16/2011.  FINDINGS: The lungs are clear. Heart size is enlarged. No pneumothorax or pleural effusion. No focal bony abnormality.  IMPRESSION: Cardiomegaly without acute disease.   Electronically Signed   By: Inge Rise M.D.   On: 07/23/2014 18:12   Ct Angio Chest Pe W/cm &/or Wo Cm  07/23/2014   CLINICAL DATA:  Hypoxia with cough and shortness of breath.  EXAM: CT ANGIOGRAPHY CHEST WITH CONTRAST  TECHNIQUE: Multidetector CT imaging of the chest was performed using the standard protocol during bolus administration of intravenous contrast. Multiplanar CT image reconstructions and MIPs were obtained to evaluate the vascular anatomy.  CONTRAST:  70 cc Omnipaque 350  COMPARISON:  None.  FINDINGS: Markedly  degraded study secondary to body habitus and patient inability to reproducibly breath hold. There is no evidence for thrombus in the main pulmonary outflow tract. Given the motion artifact and image noise, the study is nondiagnostic for detection of pulmonary embolus in the main pulmonary arteries and more distal branches.  No thoracic aortic aneurysm. The heart is enlarged. No pericardial or pleural effusion. No evidence for mediastinal lymphadenopathy.  Lung volumes are low and fine detail is distorted by breathing motion. No dense airspace consolidation. Atelectasis is seen in the lower lungs bilaterally.  Bone windows reveal no worrisome lytic or sclerotic osseous lesions.  Review of the MIP images confirms the above findings.  IMPRESSION: Nondiagnostic exam for acute pulmonary embolus secondary to body habitus and inability to breath hold.  I personally discussed this study by telephone with the PA caring for the patient, Delos Haring, at 2047 hrs on 07/23/2014.   Electronically Signed   By: Misty Stanley M.D.   On: 07/23/2014 20:47   Dg Tibia/fibula Left Port  07/24/2014   CLINICAL DATA:  Left lower leg pain, redness and swelling.  EXAM: PORTABLE LEFT TIBIA AND FIBULA - 2 VIEW  COMPARISON:  CT dated 12/25/2012.  FINDINGS: Diffuse soft tissue swelling. Unremarkable bones. No soft tissue gas.  IMPRESSION: Diffuse soft tissue swelling without underlying bony abnormality.   Electronically Signed   By: Enrique Sack M.D.   On: 07/24/2014 01:05    CBC  Recent Labs Lab 07/23/14 1554  WBC 9.9  HGB 14.8  HCT 46.4*  PLT 190  MCV 89.7  MCH 28.6  MCHC 31.9  RDW 16.2*  LYMPHSABS 0.4*  MONOABS 0.4  EOSABS 0.0  BASOSABS 0.0    Chemistries   Recent Labs Lab 07/23/14 1845  NA 140  K 4.1  CL 97  CO2 28  GLUCOSE 132*  BUN 19  CREATININE 1.12*  CALCIUM 9.4  AST 17  ALT 14  ALKPHOS 58  BILITOT 0.9    ------------------------------------------------------------------------------------------------------------------ estimated creatinine clearance is 120.8 ml/min (by C-G formula based on Cr of 1.12). ------------------------------------------------------------------------------------------------------------------ No results found for this basename: HGBA1C,  in the last 72 hours ------------------------------------------------------------------------------------------------------------------ No results found for this basename: CHOL, HDL, LDLCALC, TRIG, CHOLHDL, LDLDIRECT,  in the last 72 hours ------------------------------------------------------------------------------------------------------------------  Recent Labs  07/24/14 0348  TSH 1.390   ------------------------------------------------------------------------------------------------------------------ No results found for this basename: VITAMINB12, FOLATE, FERRITIN, TIBC, IRON, RETICCTPCT,  in the last 72 hours  Coagulation profile No results found for this basename: INR, PROTIME,  in the last 168 hours  No results found for this basename: DDIMER,  in the last 72 hours  Cardiac Enzymes  Recent Labs Lab 07/23/14 2230 07/24/14 0348 07/24/14 0843  TROPONINI <0.30 <0.30 <0.30   ------------------------------------------------------------------------------------------------------------------ No components found with this basename: POCBNP,      Time Spent in minutes   35   Josee Speece K M.D on 07/24/2014 at 12:20 PM  Between 7am to 7pm - Pager - [phone]  After 7pm go to [email or web address] - password [email or web address]  And look for the night coverage person covering for me after hours  [facility] Office  [phone]   **Disclaimer: This note may have been dictated with voice recognition software. Similar sounding words can inadvertently be transcribed and this note may contain transcription errors which may  not have been corrected upon publication of note.**

## 2014-07-24 NOTE — Progress Notes (Signed)
VASCULAR LAB PRELIMINARY  PRELIMINARY  PRELIMINARY  PRELIMINARY  Bilateral lower extremity venous duplex  completed.    Preliminary report:  Bilateral:  No obvious evidence of DVT, superficial thrombosis, Baker's Cyst, or abscess.   Interstitial fluid throughout the left calf. Technically limited by body habitus.     Alexandra Beck, RVT 07/24/2014, 12:22 PM

## 2014-07-24 NOTE — Progress Notes (Signed)
Pt c/o of cont headache and LLE pain not relieved by medication, MD/N new orders received, nursing will cont to monitor

## 2014-07-24 NOTE — Progress Notes (Signed)
Went in to talk to pt about BiPAP and pt stated she was not ready but would let RT know when she was. RT will check in on pt later on in the evening

## 2014-07-24 NOTE — Consult Note (Signed)
ANTICOAGULATION/ANTIBIOTIC CONSULT NOTE - Initial Consult  Pharmacy Consult for Lovenox Indication: DVT px  Allergies  Allergen Reactions  . Aspirin     REACTION: throat swelling, hives  . Doxycycline Other (See Comments)    Abdominal pain  . Niaspan [Niacin Er]     Caused flushing    Patient Measurements: Height: 5\' 2"  (157.5 cm) Weight: 430 lb (195.047 kg) IBW/kg (Calculated) : 50.1   Vital Signs: Temp: 97.8 F (36.6 C) (08/27 1247) Temp src: Oral (08/27 1247) BP: 117/65 mmHg (08/27 1233) Pulse Rate: 80 (08/27 1427)  Labs:  Recent Labs  07/23/14 1554 07/23/14 1845 07/23/14 2230 07/24/14 0348 07/24/14 0843  HGB 14.8  --   --   --   --   HCT 46.4*  --   --   --   --   PLT 190  --   --   --   --   CREATININE  --  1.12*  --   --   --   CKTOTAL  --   --  63  --   --   TROPONINI  --   --  <0.30 <0.30 <0.30    Estimated Creatinine Clearance: 120.8 ml/min (by C-G formula based on Cr of 1.12).   Medical History: Past Medical History  Diagnosis Date  . Morbid obesity   . Hyperlipidemia   . Diabetes mellitus   . Chronic kidney disease   . GERD (gastroesophageal reflux disease)   . Sepsis 11/2012    Secondary to cellulitis  . Hypothyroidism   . Hypertension   . Anemia   . Recurrent cellulitis of lower leg     LLE, venous insuff  . OSA (obstructive sleep apnea)   . Obesity hypoventilation syndrome   . Morbid obesity with BMI of 70 and over, adult   . CARDIOMYOPATHY, DILATED   . Depression   . Venous insufficiency of leg    Assessment: 33yo morbidly obese female presents to the ED with redness and swelling to her left leg (has been treated at [facility] for this in the past). Her VQ didn't see PE and doppler were neg for DVT. MD has changed lovenox to DVT px dose. Due to her wt, we'll use 0.5mg /kg qday.   Goal of Therapy:  Anti-Xa = 0.3-0.6   Plan:   Change lovenox to 100mg  SQ q24 Monitor for bleeding

## 2014-07-25 ENCOUNTER — Inpatient Hospital Stay (HOSPITAL_COMMUNITY): Payer: BC Managed Care – PPO

## 2014-07-25 DIAGNOSIS — M79609 Pain in unspecified limb: Secondary | ICD-10-CM

## 2014-07-25 LAB — OSMOLALITY: OSMOLALITY: 289 mosm/kg (ref 275–300)

## 2014-07-25 LAB — BASIC METABOLIC PANEL
Anion gap: 13 (ref 5–15)
BUN: 30 mg/dL — AB (ref 6–23)
CHLORIDE: 97 meq/L (ref 96–112)
CO2: 26 mEq/L (ref 19–32)
Calcium: 8.3 mg/dL — ABNORMAL LOW (ref 8.4–10.5)
Creatinine, Ser: 1.7 mg/dL — ABNORMAL HIGH (ref 0.50–1.10)
GFR calc Af Amer: 44 mL/min — ABNORMAL LOW (ref >90)
GFR calc non Af Amer: 38 mL/min — ABNORMAL LOW (ref >90)
GLUCOSE: 112 mg/dL — AB (ref 70–99)
POTASSIUM: 4.3 meq/L (ref 3.7–5.3)
SODIUM: 136 meq/L — AB (ref 137–147)

## 2014-07-25 LAB — URINE MICROSCOPIC-ADD ON

## 2014-07-25 LAB — CBC
HEMATOCRIT: 41.7 % (ref 36.0–46.0)
Hemoglobin: 12.7 g/dL (ref 12.0–15.0)
MCH: 28.7 pg (ref 26.0–34.0)
MCHC: 30.5 g/dL (ref 30.0–36.0)
MCV: 94.3 fL (ref 78.0–100.0)
Platelets: 146 10*3/uL — ABNORMAL LOW (ref 150–400)
RBC: 4.42 MIL/uL (ref 3.87–5.11)
RDW: 16.6 % — ABNORMAL HIGH (ref 11.5–15.5)
WBC: 10.6 10*3/uL — AB (ref 4.0–10.5)

## 2014-07-25 LAB — URINALYSIS, ROUTINE W REFLEX MICROSCOPIC
BILIRUBIN URINE: NEGATIVE
GLUCOSE, UA: NEGATIVE mg/dL
Hgb urine dipstick: NEGATIVE
KETONES UR: NEGATIVE mg/dL
NITRITE: NEGATIVE
PH: 5 (ref 5.0–8.0)
Protein, ur: 30 mg/dL — AB
Specific Gravity, Urine: 1.026 (ref 1.005–1.030)
Urobilinogen, UA: 0.2 mg/dL (ref 0.0–1.0)

## 2014-07-25 LAB — CREATININE, URINE, RANDOM: CREATININE, URINE: 163.16 mg/dL

## 2014-07-25 LAB — OSMOLALITY, URINE: Osmolality, Ur: 422 mOsm/kg (ref 390–1090)

## 2014-07-25 LAB — SODIUM, URINE, RANDOM: Sodium, Ur: <20 mEq/L

## 2014-07-25 MED ORDER — METOPROLOL TARTRATE 50 MG PO TABS
50.0000 mg | ORAL_TABLET | Freq: Two times a day (BID) | ORAL | Status: DC
  Administered 2014-07-26 – 2014-07-28 (×6): 50 mg via ORAL
  Filled 2014-07-25 (×8): qty 1

## 2014-07-25 MED ORDER — MORPHINE SULFATE 2 MG/ML IJ SOLN
2.0000 mg | INTRAMUSCULAR | Status: DC | PRN
  Administered 2014-07-25 – 2014-07-27 (×3): 2 mg via INTRAVENOUS
  Filled 2014-07-25 (×3): qty 1

## 2014-07-25 MED ORDER — SODIUM CHLORIDE 0.9 % IV SOLN
3.0000 g | Freq: Four times a day (QID) | INTRAVENOUS | Status: DC
  Administered 2014-07-25 – 2014-07-28 (×12): 3 g via INTRAVENOUS
  Filled 2014-07-25 (×18): qty 3

## 2014-07-25 MED ORDER — AMLODIPINE BESYLATE 5 MG PO TABS
5.0000 mg | ORAL_TABLET | Freq: Every day | ORAL | Status: DC
  Administered 2014-07-26 – 2014-07-28 (×3): 5 mg via ORAL
  Filled 2014-07-25 (×3): qty 1

## 2014-07-25 MED ORDER — SODIUM CHLORIDE 0.9 % IV SOLN
INTRAVENOUS | Status: AC
  Administered 2014-07-25 – 2014-07-26 (×3): via INTRAVENOUS

## 2014-07-25 NOTE — Progress Notes (Signed)
PRELIMINARY  Ankle brachial index.  Could not obtain pressures due to patient pain intolerance and cuff could not fit well enough for accurate results.  Based on Waveforms only, there appears to be adequate arterial flow to bilateral lower extremities with Triphasic waveforms.   Landry Mellow, RDMS, RVT 07/25/2014

## 2014-07-25 NOTE — Progress Notes (Signed)
Orthopedic Tech Progress Note Patient Details:  Alexandra Beck [birth year] [id number]  Ortho Devices Type of Ortho Device: Haematologist Ortho Device/Splint Location: (B) LE'S Ortho Device/Splint Interventions: Ordered;Application   Braulio Bosch 07/25/2014, 5:49 PM

## 2014-07-25 NOTE — Consult Note (Signed)
Reason for Consult: Acute Renal Failure Referring Physician: Lala Lund MD [facility])  HPI:  33 year old woman with history of morbid obesity, diabetes mellitus, hypertension and hypothyroidism who presented to the hospital 2 days ago with rather acute onset of left lower extremity erythema. She reports a preceding history of bilateral lower extremity swelling however no pain or redness but developed this on the morning of 8/26. Because she has had previous history of cellulitis that was aggressive and resulted in a prolonged hospitalization with acute renal failure, she promptly presented to the emergency room for evaluation and management. She also reported some exertional dyspnea and chest tightness on admission and underwent CT angiogram to rule out PE on 8/26. Unfortunately, the study was not conclusive as she was unable to hold her breath and her habitus limited visualization. She denies any trauma to her left lower extremity. She is now started developing some erythema and pain of her right lower extremity. She had some episodes of transient hypotension since admission and blood cultures are growing Streptococcus group G she denies any nausea vomiting or diarrhea preceding her hospitalization but reports poor appetite/allergies. Denies any dysgeusia/tremor. Denies taking any nonsteroidal anti-inflammatory drugs. Denies any dysuria, urgency or frequency.  Between January and February of 2014 she was admitted to the hospital with cellulitis and had associated acute renal failure with creatinine that rose as high as 11 however, she did not require dialysis and spontaneously recovered to be discharged with a creatinine in the mid 2s. When followed up in August of 2014, creatinine was 0.8. She has not had any labs in the interim.  Past Medical History  Diagnosis Date  . Morbid obesity   . Hyperlipidemia   . Diabetes mellitus   . Chronic kidney disease   . GERD (gastroesophageal reflux disease)   .  Sepsis 11/2012    Secondary to cellulitis  . Hypothyroidism   . Hypertension   . Anemia   . Recurrent cellulitis of lower leg     LLE, venous insuff  . OSA (obstructive sleep apnea)   . Obesity hypoventilation syndrome   . Morbid obesity with BMI of 70 and over, adult   . CARDIOMYOPATHY, DILATED   . Depression   . Venous insufficiency of leg     Past Surgical History  Procedure Laterality Date  . Wisdom tooth extraction      Family History  Problem Relation Age of Onset  . Hypertension Mother   . Hypertension Father   . Heart disease Father     before age 70    Social History:  reports that she quit smoking about 3 years ago. Her smoking use included Cigarettes. She smoked 0.00 packs per day for 10 years. She has never used smokeless tobacco. She reports that she does not drink alcohol or use illicit drugs.  Allergies:  Allergies  Allergen Reactions  . Aspirin     REACTION: throat swelling, hives  . Doxycycline Other (See Comments)    Abdominal pain  . Niaspan [Niacin Er]     Caused flushing    Medications:  Scheduled: . ampicillin-sulbactam (UNASYN) IV  3 g Intravenous Q6H  . antiseptic oral rinse  7 mL Mouth Rinse BID  . atorvastatin  10 mg Oral q1800  . Chlorhexidine Gluconate Cloth  6 each Topical Q0600  . enoxaparin (LOVENOX) injection  100 mg Subcutaneous Q24H  . guaiFENesin  600 mg Oral BID  . levothyroxine  150 mcg Oral QAC breakfast  . mupirocin ointment  1 application Nasal BID  . sodium chloride  3 mL Intravenous Q12H    Results for orders placed during the hospital encounter of 07/23/14 (from the past 48 hour(s))  CBC WITH DIFFERENTIAL     Status: Abnormal   Collection Time    07/23/14  3:54 PM      Result Value Ref Range   WBC 9.9  4.0 - 10.5 K/uL   RBC 5.17 (*) 3.87 - 5.11 MIL/uL   Hemoglobin 14.8  12.0 - 15.0 g/dL   HCT 46.4 (*) 36.0 - 46.0 %   MCV 89.7  78.0 - 100.0 fL   MCH 28.6  26.0 - 34.0 pg   MCHC 31.9  30.0 - 36.0 g/dL   RDW 16.2  (*) 11.5 - 15.5 %   Platelets 190  150 - 400 K/uL   Neutrophils Relative % 91 (*) 43 - 77 %   Neutro Abs 9.0 (*) 1.7 - 7.7 K/uL   Lymphocytes Relative 5 (*) 12 - 46 %   Lymphs Abs 0.4 (*) 0.7 - 4.0 K/uL   Monocytes Relative 4  3 - 12 %   Monocytes Absolute 0.4  0.1 - 1.0 K/uL   Eosinophils Relative 0  0 - 5 %   Eosinophils Absolute 0.0  0.0 - 0.7 K/uL   Basophils Relative 0  0 - 1 %   Basophils Absolute 0.0  0.0 - 0.1 K/uL  I-STAT CG4 LACTIC ACID, ED     Status: Abnormal   Collection Time    07/23/14  4:51 PM      Result Value Ref Range   Lactic Acid, Venous 4.02 (*) 0.5 - 2.2 mmol/L  CULTURE, BLOOD (ROUTINE X 2)     Status: None   Collection Time    07/23/14  5:05 PM      Result Value Ref Range   Specimen Description BLOOD ARM LEFT     Special Requests BOTTLES DRAWN AEROBIC AND ANAEROBIC 10CC     Culture  Setup Time       Value: 07/23/2014 20:37     Performed at [facility]   Culture       Value: STREPTOCOCCUS GROUP G     Note: Gram Stain Report Called to,Read Back By and Verified With: JOANNA L'@8' :50AM ON 07/24/14 BY DANTS     Performed at [facility]   Report Status PENDING    CULTURE, BLOOD (ROUTINE X 2)     Status: None   Collection Time    07/23/14  5:15 PM      Result Value Ref Range   Specimen Description BLOOD ARM RIGHT     Special Requests BOTTLES DRAWN AEROBIC AND ANAEROBIC 10CC     Culture  Setup Time       Value: 07/23/2014 20:38     Performed at [facility]   Culture       Value:        BLOOD CULTURE RECEIVED NO GROWTH TO DATE CULTURE WILL BE HELD FOR 5 DAYS BEFORE ISSUING A FINAL NEGATIVE REPORT     Performed at [facility]   Report Status PENDING    COMPREHENSIVE METABOLIC PANEL     Status: Abnormal   Collection Time    07/23/14  6:45 PM      Result Value Ref Range   Sodium 140  137 - 147 mEq/L   Potassium 4.1  3.7 - 5.3 mEq/L   Chloride 97  96 - 112  mEq/L   CO2 28  19 - 32 mEq/L   Glucose, Bld 132 (*) 70 - 99 mg/dL    BUN 19  6 - 23 mg/dL   Creatinine, Ser 1.12 (*) 0.50 - 1.10 mg/dL   Calcium 9.4  8.4 - 10.5 mg/dL   Total Protein 6.9  6.0 - 8.3 g/dL   Albumin 3.4 (*) 3.5 - 5.2 g/dL   AST 17  0 - 37 U/L   ALT 14  0 - 35 U/L   Alkaline Phosphatase 58  39 - 117 U/L   Total Bilirubin 0.9  0.3 - 1.2 mg/dL   GFR calc non Af Amer 63 (*) >90 mL/min   GFR calc Af Amer 73 (*) >90 mL/min   Comment: (NOTE)     The eGFR has been calculated using the CKD EPI equation.     This calculation has not been validated in all clinical situations.     eGFR's persistently <90 mL/min signify possible Chronic Kidney     Disease.   Anion gap 15  5 - 15  I-STAT ARTERIAL BLOOD GAS, ED     Status: Abnormal   Collection Time    07/23/14 10:30 PM      Result Value Ref Range   pH, Arterial 7.351  7.350 - 7.450   pCO2 arterial 51.4 (*) 35.0 - 45.0 mmHg   pO2, Arterial 95.0  80.0 - 100.0 mmHg   Bicarbonate 28.4 (*) 20.0 - 24.0 mEq/L   TCO2 30  0 - 100 mmol/L   O2 Saturation 97.0     Acid-Base Excess 2.0  0.0 - 2.0 mmol/L   Patient temperature 98.6 F     Sample type ARTERIAL    TROPONIN I     Status: None   Collection Time    07/23/14 10:30 PM      Result Value Ref Range   Troponin I <0.30  <0.30 ng/mL   Comment:            Due to the release kinetics of cTnI,     a negative result within the first hours     of the onset of symptoms does not rule out     myocardial infarction with certainty.     If myocardial infarction is still suspected,     repeat the test at appropriate intervals.  CK     Status: None   Collection Time    07/23/14 10:30 PM      Result Value Ref Range   Total CK 63  7 - 177 U/L  SEDIMENTATION RATE     Status: None   Collection Time    07/23/14 10:30 PM      Result Value Ref Range   Sed Rate 8  0 - 22 mm/hr  C-REACTIVE PROTEIN     Status: Abnormal   Collection Time    07/23/14 10:30 PM      Result Value Ref Range   CRP 6.5 (*) <0.60 mg/dL   Comment: Performed at [facility] PCR SCREENING     Status: Abnormal   Collection Time    07/24/14 12:07 AM      Result Value Ref Range   MRSA by PCR POSITIVE (*) NEGATIVE   Comment:            The GeneXpert MRSA Assay (FDA     approved for NASAL specimens     only), is one component of a  comprehensive MRSA colonization     surveillance program. It is not     intended to diagnose MRSA     infection nor to guide or     monitor treatment for     MRSA infections.     RESULT CALLED TO, READ BACK BY AND VERIFIED WITH:     Ellison Hughs RN [id number] AT 0232 SKEEN,P  TROPONIN I     Status: None   Collection Time    07/24/14  3:48 AM      Result Value Ref Range   Troponin I <0.30  <0.30 ng/mL   Comment:            Due to the release kinetics of cTnI,     a negative result within the first hours     of the onset of symptoms does not rule out     myocardial infarction with certainty.     If myocardial infarction is still suspected,     repeat the test at appropriate intervals.  TSH     Status: None   Collection Time    07/24/14  3:48 AM      Result Value Ref Range   TSH 1.390  0.350 - 4.500 uIU/mL  CORTISOL-AM, BLOOD     Status: Abnormal   Collection Time    07/24/14  3:48 AM      Result Value Ref Range   Cortisol - AM 27.6 (*) 4.3 - 22.4 ug/dL   Comment: Performed at [facility]  TROPONIN I     Status: None   Collection Time    07/24/14  8:43 AM      Result Value Ref Range   Troponin I <0.30  <0.30 ng/mL   Comment:            Due to the release kinetics of cTnI,     a negative result within the first hours     of the onset of symptoms does not rule out     myocardial infarction with certainty.     If myocardial infarction is still suspected,     repeat the test at appropriate intervals.  CBC     Status: Abnormal   Collection Time    07/25/14  3:40 AM      Result Value Ref Range   WBC 10.6 (*) 4.0 - 10.5 K/uL   RBC 4.42  3.87 - 5.11 MIL/uL   Hemoglobin 12.7  12.0 - 15.0 g/dL   HCT 41.7   36.0 - 46.0 %   MCV 94.3  78.0 - 100.0 fL   MCH 28.7  26.0 - 34.0 pg   MCHC 30.5  30.0 - 36.0 g/dL   RDW 16.6 (*) 11.5 - 15.5 %   Platelets 146 (*) 150 - 400 K/uL   Comment: DELTA CHECK NOTED     REPEATED TO VERIFY     SPECIMEN CHECKED FOR CLOTS  BASIC METABOLIC PANEL     Status: Abnormal   Collection Time    07/25/14  3:40 AM      Result Value Ref Range   Sodium 136 (*) 137 - 147 mEq/L   Potassium 4.3  3.7 - 5.3 mEq/L   Chloride 97  96 - 112 mEq/L   CO2 26  19 - 32 mEq/L   Glucose, Bld 112 (*) 70 - 99 mg/dL   BUN 30 (*) 6 - 23 mg/dL   Comment: DELTA CHECK NOTED   Creatinine, Ser 1.70 (*) 0.50 -  1.10 mg/dL   Comment: DELTA CHECK NOTED   Calcium 8.3 (*) 8.4 - 10.5 mg/dL   GFR calc non Af Amer 38 (*) >90 mL/min   GFR calc Af Amer 44 (*) >90 mL/min   Comment: (NOTE)     The eGFR has been calculated using the CKD EPI equation.     This calculation has not been validated in all clinical situations.     eGFR's persistently <90 mL/min signify possible Chronic Kidney     Disease.   Anion gap 13  5 - 15  CREATININE, URINE, RANDOM     Status: None   Collection Time    07/25/14 10:40 AM      Result Value Ref Range   Creatinine, Urine 163.16    URINALYSIS, ROUTINE W REFLEX MICROSCOPIC     Status: Abnormal   Collection Time    07/25/14 10:40 AM      Result Value Ref Range   Color, Urine YELLOW  YELLOW   APPearance TURBID (*) CLEAR   Specific Gravity, Urine 1.026  1.005 - 1.030   pH 5.0  5.0 - 8.0   Glucose, UA NEGATIVE  NEGATIVE mg/dL   Hgb urine dipstick NEGATIVE  NEGATIVE   Bilirubin Urine NEGATIVE  NEGATIVE   Ketones, ur NEGATIVE  NEGATIVE mg/dL   Protein, ur 30 (*) NEGATIVE mg/dL   Urobilinogen, UA 0.2  0.0 - 1.0 mg/dL   Nitrite NEGATIVE  NEGATIVE   Leukocytes, UA SMALL (*) NEGATIVE  SODIUM, URINE, RANDOM     Status: None   Collection Time    07/25/14 10:40 AM      Result Value Ref Range   Sodium, Ur <20    URINE MICROSCOPIC-ADD ON     Status: Abnormal   Collection  Time    07/25/14 10:40 AM      Result Value Ref Range   Squamous Epithelial / LPF FEW (*) RARE   WBC, UA 7-10  <3 WBC/hpf   RBC / HPF 0-2  <3 RBC/hpf   Bacteria, UA FEW (*) RARE   Urine-Other AMORPHOUS URATES/PHOSPHATES      Dg Chest 2 View  07/23/2014   CLINICAL DATA:  Fever and shortness of breath.  Left leg cellulitis.  EXAM: CHEST  2 VIEW  COMPARISON:  PA and lateral chest 09/16/2011.  FINDINGS: The lungs are clear. Heart size is enlarged. No pneumothorax or pleural effusion. No focal bony abnormality.  IMPRESSION: Cardiomegaly without acute disease.   Electronically Signed   By: Inge Rise M.D.   On: 07/23/2014 18:12   Ct Angio Chest Pe W/cm &/or Wo Cm  07/23/2014   CLINICAL DATA:  Hypoxia with cough and shortness of breath.  EXAM: CT ANGIOGRAPHY CHEST WITH CONTRAST  TECHNIQUE: Multidetector CT imaging of the chest was performed using the standard protocol during bolus administration of intravenous contrast. Multiplanar CT image reconstructions and MIPs were obtained to evaluate the vascular anatomy.  CONTRAST:  70 cc Omnipaque 350  COMPARISON:  None.  FINDINGS: Markedly degraded study secondary to body habitus and patient inability to reproducibly breath hold. There is no evidence for thrombus in the main pulmonary outflow tract. Given the motion artifact and image noise, the study is nondiagnostic for detection of pulmonary embolus in the main pulmonary arteries and more distal branches.  No thoracic aortic aneurysm. The heart is enlarged. No pericardial or pleural effusion. No evidence for mediastinal lymphadenopathy.  Lung volumes are low and fine detail is distorted by breathing motion.  No dense airspace consolidation. Atelectasis is seen in the lower lungs bilaterally.  Bone windows reveal no worrisome lytic or sclerotic osseous lesions.  Review of the MIP images confirms the above findings.  IMPRESSION: Nondiagnostic exam for acute pulmonary embolus secondary to body habitus and  inability to breath hold.  I personally discussed this study by telephone with the PA caring for the patient, Delos Haring, at 2047 hrs on 07/23/2014.   Electronically Signed   By: Misty Stanley M.D.   On: 07/23/2014 20:47   US Renal  07/25/2014   CLINICAL DATA:  Acute renal failure  EXAM: RENAL/URINARY TRACT ULTRASOUND COMPLETE  COMPARISON:  12/25/2012  FINDINGS: Right Kidney:  Length: 11.3. Echogenicity within normal limits. No mass or hydronephrosis visualized.  Left Kidney:  Length: 10.3. Echogenicity within normal limits. No mass or hydronephrosis visualized. Significantly limited evaluation of the left kidney due to body habitus.  Bladder:  Appears normal for degree of bladder distention.  IMPRESSION: Study is limited by body habitus which particularly limits evaluation of the left kidney, with left kidney seen and only limited detail. Study is otherwise normal.   Electronically Signed   By: Skipper Cliche M.D.   On: 07/25/2014 09:36   Nm Pulmonary Perf And Vent  07/24/2014   CLINICAL DATA:  Shortness of breath.  EXAM: NUCLEAR MEDICINE VENTILATION - PERFUSION LUNG SCAN  TECHNIQUE: Ventilation images were obtained in multiple projections using inhaled aerosol technetium 99 M DTPA. Perfusion images were obtained in multiple projections after intravenous injection of Tc-25mMAA.  RADIOPHARMACEUTICALS:  6 mCi Tc-945mTPA aerosol and 40 mCi Tc-9980mA  COMPARISON:  CT chest 07/23/2014.  FINDINGS: Ventilation: This study is limited due to the patient's size. Not all views could be obtained. No defect is identified.  Perfusion: Not all views could be obtained. No defect is identified.  IMPRESSION: Limited study demonstrating no evidence pulmonary embolus.   Electronically Signed   By: ThoInge RiseD.   On: 07/24/2014 14:13   Dg Tibia/fibula Left Port  07/24/2014   CLINICAL DATA:  Left lower leg pain, redness and swelling.  EXAM: PORTABLE LEFT TIBIA AND FIBULA - 2 VIEW  COMPARISON:  CT dated  12/25/2012.  FINDINGS: Diffuse soft tissue swelling. Unremarkable bones. No soft tissue gas.  IMPRESSION: Diffuse soft tissue swelling without underlying bony abnormality.   Electronically Signed   By: SteEnrique SackD.   On: 07/24/2014 01:05    Review of Systems  Constitutional: Positive for fever, chills and malaise/fatigue. Negative for weight loss.  HENT: Negative for ear discharge, ear pain, hearing loss, nosebleeds and tinnitus.   Eyes: Negative.   Respiratory: Positive for shortness of breath. Negative for cough and hemoptysis.        Exertional dyspnea apparently worsening in the recent past  Cardiovascular: Positive for chest pain and leg swelling.       Exertional chest pain/tightness  Gastrointestinal: Negative.   Genitourinary: Negative.   Musculoskeletal:       Pain in both lower extremities-left greater than right  Skin: Positive for rash.       Erythema LLE >RLE  Neurological: Negative.  Negative for weakness.  Endo/Heme/Allergies: Negative.   Psychiatric/Behavioral: The patient is nervous/anxious.    Blood pressure 121/42, pulse 73, temperature 98.1 F (36.7 C), temperature source Oral, resp. rate 14, height '5\' 2"'  (1.575 m), weight 195.047 kg (430 lb), last menstrual period 07/09/2014, SpO2 95.00%. Physical Exam  Nursing note and vitals reviewed. Constitutional: She is oriented  to person, place, and time. She appears well-developed.  Obese Sitting up in her recliner intermittently somnolent-lunch tray untouched  HENT:  Head: Normocephalic and atraumatic.  Nose: Nose normal.  Mouth/Throat: No oropharyngeal exudate.  Eyes: EOM are normal. Pupils are equal, round, and reactive to light. No scleral icterus.  Neck: Normal range of motion. Neck supple. No JVD present. No thyromegaly present.  Cardiovascular: Normal rate, regular rhythm and normal heart sounds.  Exam reveals no friction rub.   No murmur heard. Respiratory: Effort normal and breath sounds normal. No  respiratory distress. She has no wheezes.  GI: Soft. Bowel sounds are normal. She exhibits no distension. There is no tenderness.  Musculoskeletal: She exhibits edema.  Tender 1+ lower extremity edema  Neurological: She is alert and oriented to person, place, and time. No cranial nerve deficit.  Skin: Skin is warm. No rash noted. There is erythema.  Left lower extremity with erythema extending all the way up to the knee, focal lateral area of erythema right lower extremity  Psychiatric: She has a normal mood and affect. Her behavior is normal.    Assessment/Plan: 1. Acute renal failure: Baseline appears to be normal based on her last lab from August of 2014. Her biggest risk factor is a significant acute renal failure that she had last year that would've conferred some renal injury. Her current insult appears to be from ATN of relative hypotension/sepsis and also from the CT angiogram done on 07/23/14 (contrast-induced nephropathy). She was taking hydrochlorothiazide prior to admission but not on RAS blocking agents or nonsteroidal anti-inflammatory drugs. Urine output appears to be satisfactory and electrolytes/volume status at this time appears to be noncritical. No acute need for dialysis noted. Renal ultrasound is limited due to her body habitus but shows no hydronephrosis. Urinalysis does not point to a GN-not surprisingly, urine sodium/FENa are low in keeping with contrast-induced nephropathy as the predominating etiology. Agree with continuing normal saline at this point at a lower rate of 75 cc an hour for the next 12 hours. 2. Cellulitis with sepsis: Group G. streptococcus growing from blood stream, on appropriate antibiotic at renal doses. 3. Hyponatremia: Mild from defect of water handling in acute renal failure, continue to monitor and start fluid restriction if drops further. 4. Acute on chronic dyspnea: Suspected that there might be a large component from obesity hypoventilation syndrome  and she may benefit from positive pressure ventilation. CT scan was done for PE however, nondiagnostic the to the patient's habitus and her inability to hold her breath. 5. Hypertension: Currently with low normal blood pressures, continue to hold antihypertensive therapy.  Kayelynn Abdou K. 07/25/2014, 1:26 PM

## 2014-07-25 NOTE — Progress Notes (Signed)
Placed pt on CPAP at this time with setting of 12cmH2O. Pt tolerating well at this time.

## 2014-07-25 NOTE — Progress Notes (Addendum)
ANTIBIOTIC CONSULT NOTE - FOLLOW UP  Pharmacy Consult for Vancomycin / Zosyn == > Unasyn Indication: Sepsis due to cellulitis  Allergies  Allergen Reactions  . Aspirin     REACTION: throat swelling, hives  . Doxycycline Other (See Comments)    Abdominal pain  . Niaspan [Niacin Er]     Caused flushing    Assessment: 33 year old female on Day 2 of Vancomycin and Zosyn for sepsis / cellultis Scr trended up to 1.7 Blood cx > Strep (could consider de-escalating antibiotics if clinically appropriate?) ==>de-escalated to Unasyn Tmax = 100.4, WBC = 10.6  Goal of Therapy:  Appropriate Unasyn dosing  Plan:  1) Unasyn 3 grams iv Q 6 hours 2) Continue to follow   Labs:  Recent Labs  07/23/14 1554 07/23/14 1845 07/25/14 0340  WBC 9.9  --  10.6*  HGB 14.8  --  12.7  PLT 190  --  146*  CREATININE  --  1.12* 1.70*    Microbiology: Recent Results (from the past 720 hour(s))  CULTURE, BLOOD (ROUTINE X 2)     Status: None   Collection Time    07/23/14  5:05 PM      Result Value Ref Range Status   Specimen Description BLOOD ARM LEFT   Final   Special Requests BOTTLES DRAWN AEROBIC AND ANAEROBIC 10CC   Final   Culture  Setup Time     Final   Value: 07/23/2014 20:37     Performed at [facility]   Culture     Final   Value: STREPTOCOCCUS GROUP G     Note: Gram Stain Report Called to,Read Back By and Verified With: JOANNA L@8 :50AM ON 07/24/14 BY DANTS     Performed at [facility]   Report Status PENDING   Incomplete  CULTURE, BLOOD (ROUTINE X 2)     Status: None   Collection Time    07/23/14  5:15 PM      Result Value Ref Range Status   Specimen Description BLOOD ARM RIGHT   Final   Special Requests BOTTLES DRAWN AEROBIC AND ANAEROBIC 10CC   Final   Culture  Setup Time     Final   Value: 07/23/2014 20:38     Performed at [facility]   Culture     Final   Value:        BLOOD CULTURE RECEIVED NO GROWTH TO DATE CULTURE WILL BE HELD FOR 5 DAYS BEFORE  ISSUING A FINAL NEGATIVE REPORT     Performed at [facility]   Report Status PENDING   Incomplete  MRSA PCR SCREENING     Status: Abnormal   Collection Time    07/24/14 12:07 AM      Result Value Ref Range Status   MRSA by PCR POSITIVE (*) NEGATIVE Final   Comment:            The GeneXpert MRSA Assay (FDA     approved for NASAL specimens     only), is one component of a     comprehensive MRSA colonization     surveillance program. It is not     intended to diagnose MRSA     infection nor to guide or     monitor treatment for     MRSA infections.     RESULT CALLED TO, READ BACK BY AND VERIFIED WITH:     Ellison Hughs RN [phone] AT 0454 [id number],B    Anti-infectives   Start  Dose/Rate Route Frequency Ordered Stop   07/24/14 1800  vancomycin (VANCOCIN) 1,500 mg in sodium chloride 0.9 % 500 mL IVPB     1,500 mg 250 mL/hr over 120 Minutes Intravenous Every 12 hours 07/23/14 2247     07/23/14 2300  vancomycin (VANCOCIN) IVPB 1000 mg/200 mL premix     1,000 mg 200 mL/hr over 60 Minutes Intravenous  Once 07/23/14 2247 07/24/14 0103   07/23/14 2300  piperacillin-tazobactam (ZOSYN) IVPB 3.375 g     3.375 g 12.5 mL/hr over 240 Minutes Intravenous 3 times per day 07/23/14 2247     07/23/14 2115  piperacillin-tazobactam (ZOSYN) IVPB 3.375 g     3.375 g 12.5 mL/hr over 240 Minutes Intravenous  Once 07/23/14 2106 07/24/14 0130   07/23/14 1700  vancomycin (VANCOCIN) IVPB 1000 mg/200 mL premix  Status:  Discontinued     1,000 mg 200 mL/hr over 60 Minutes Intravenous  Once 07/23/14 1656 07/23/14 1657   07/23/14 1700  vancomycin (VANCOCIN) IVPB 1000 mg/200 mL premix    Comments:  DO NOT START UNTIL BLOOD CULTURES HAVE BEEN DRAWN.   1,000 mg 200 mL/hr over 60 Minutes Intravenous  Once 07/23/14 1657 07/23/14 1917      Thank you. Anette Guarneri, PharmD [phone]  Tad Moore 07/25/2014,9:58 AM

## 2014-07-25 NOTE — Progress Notes (Signed)
Patient Demographics  Alexandra Beck, is a 33 y.o. female, DOB - [birth year], TML:[id number]  Admit date - 07/23/2014   Admitting Physician Berle Mull, MD  Outpatient Primary MD for the patient is Alexandra Grant, MD  LOS - 2   Chief Complaint  Patient presents with  . Cellulitis    The patient said she has been seen here and at [facility] for the same thing.  The patient has been hospitalized for the same thing in the past.        Subjective:   Alexandra Beck today has, No headache, No chest pain, No abdominal pain - No Nausea, No new weakness tingling or numbness, No Cough - SOB. Continues to have minimal pain and discomfort in the right leg.  Assessment & Plan    1. Sepsis due to cellulitis in the right lower extremity. Cultures noted, one out of 2 sets growing Streptococcus group G., discussed antibiotic options with ID over the phone, will downgrade her to IV Unasyn for now, once improved okay to switch to oral antibiotic per ID for total of 14 days. Right lower extremity venous duplex negative, there is nonspecific soft tissue fluid action on ultrasound have requested orthopedics Dr. Sharol Given to evaluate for possible incision and drainage she does have fluctuance on exam as well.   2. Acute on chronic shortness of breath. Worsening exercise capacity. Likely due to severe underlying morbid obesity with BMI of 70. CT of the chest inconclusive to rule out PE, lower extremity venous duplex negative, VQ scan unremarkable, echogram shows chronic diastolic CHF. Negative serial troponins. Likely shortness of breath obesity-related. Post discharge pulmonary followup.    3. Hypothyroidism. Continue home dose Synthroid , stable baseline TSH of 1.3.    4. Dyslipidemia continue home dose statin.    5.  Essential hypertension - pressure soft hold HCTZ, Lopressor and Norvasc.    6. OSA. CPAP and in bed. Patient encouraged to increase activity and sit in the recliner during the daytime.      7. ARF. Likely due to sepsis and transient hypotension in the day of admission, she also received IV contrast to rule out PE. Continue to hydrate her, avoid nephrotoxins, UA and renal ultrasound ordered. Renal to evaluate.      Code Status: Full  Family Communication: None present  Disposition Plan: To be decided   Procedures CT angiogram chest inconclusive for PE, VQ scan, echogram, venous duplex right lower extremity, ABI right lower extremity  TTE  - Left ventricle: The cavity size was dilated. Wall thickness was increased. Systolic function was probably normal. The estimated ejection fraction was = 50%. - Left atrium: The atrium was dilated. - Right ventricle: Systolic function was reduced. - Right atrium: The atrium was dilated. - Pericardium, extracardiac: A small pericardial effusion was identified.    Consults  orthopedics Dr. Sharol Given, renal, ID Dr. Linus Salmons on phone   Medications  Scheduled Meds: . antiseptic oral rinse  7 mL Mouth Rinse BID  . atorvastatin  10 mg Oral q1800  . Chlorhexidine Gluconate Cloth  6 each Topical Q0600  . enoxaparin (LOVENOX) injection  100 mg Subcutaneous Q24H  . guaiFENesin  600 mg Oral BID  . levothyroxine  150 mcg Oral QAC breakfast  .  mupirocin ointment  1 application Nasal BID  . piperacillin-tazobactam (ZOSYN)  IV  3.375 g Intravenous 3 times per day  . sodium chloride  3 mL Intravenous Q12H   Continuous Infusions: . sodium chloride 125 mL/hr at 07/25/14 1025   PRN Meds:.acetaminophen, acetaminophen, ondansetron (ZOFRAN) IV, oxyCODONE  DVT Prophylaxis  Lovenox   Lab Results  Component Value Date   PLT 146* 07/25/2014    Antibiotics     Anti-infectives   Start     Dose/Rate Route Frequency Ordered Stop   07/24/14 1800  vancomycin  (VANCOCIN) 1,500 mg in sodium chloride 0.9 % 500 mL IVPB  Status:  Discontinued     1,500 mg 250 mL/hr over 120 Minutes Intravenous Every 12 hours 07/23/14 2247 07/25/14 1034   07/23/14 2300  vancomycin (VANCOCIN) IVPB 1000 mg/200 mL premix     1,000 mg 200 mL/hr over 60 Minutes Intravenous  Once 07/23/14 2247 07/24/14 0103   07/23/14 2300  piperacillin-tazobactam (ZOSYN) IVPB 3.375 g     3.375 g 12.5 mL/hr over 240 Minutes Intravenous 3 times per day 07/23/14 2247     07/23/14 2115  piperacillin-tazobactam (ZOSYN) IVPB 3.375 g     3.375 g 12.5 mL/hr over 240 Minutes Intravenous  Once 07/23/14 2106 07/24/14 0130   07/23/14 1700  vancomycin (VANCOCIN) IVPB 1000 mg/200 mL premix  Status:  Discontinued     1,000 mg 200 mL/hr over 60 Minutes Intravenous  Once 07/23/14 1656 07/23/14 1657   07/23/14 1700  vancomycin (VANCOCIN) IVPB 1000 mg/200 mL premix    Comments:  DO NOT START UNTIL BLOOD CULTURES HAVE BEEN DRAWN.   1,000 mg 200 mL/hr over 60 Minutes Intravenous  Once 07/23/14 1657 07/23/14 1917          Objective:   Filed Vitals:   07/25/14 0059 07/25/14 0438 07/25/14 0752 07/25/14 0817  BP:  111/55 121/42   Pulse: 79 84 73   Temp:  99.5 F (37.5 C)  97.7 F (36.5 C)  TempSrc:  Axillary  Axillary  Resp: 16 10 14    Height:      Weight:      SpO2: 97% 95% 95%     Wt Readings from Last 3 Encounters:  07/23/14 195.047 kg (430 lb)  08/14/13 182.709 kg (402 lb 12.8 oz)  08/02/13 181.348 kg (399 lb 12.8 oz)     Intake/Output Summary (Last 24 hours) at 07/25/14 1035 Last data filed at 07/25/14 0500  Gross per 24 hour  Intake   2460 ml  Output   1450 ml  Net   1010 ml     Physical Exam  Awake Alert, Oriented X 3, No new F.N deficits, Normal affect [place].AT,PERRAL Supple Neck,No JVD, No cervical lymphadenopathy appriciated.  Symmetrical Chest wall movement, Good air movement bilaterally, CTAB RRR,No Gallops,Rubs or new Murmurs, No Parasternal Heave +ve B.Sounds, Abd  Soft, No tenderness, No organomegaly appriciated, No rebound - guarding or rigidity. No Cyanosis, Clubbing or edema, No new Rash or bruise , right leg has frank cellulitis below the knee, minimal fluctuance behind the left calf   Data Review   Micro Results Recent Results (from the past 240 hour(s))  CULTURE, BLOOD (ROUTINE X 2)     Status: None   Collection Time    07/23/14  5:05 PM      Result Value Ref Range Status   Specimen Description BLOOD ARM LEFT   Final   Special Requests BOTTLES DRAWN AEROBIC AND ANAEROBIC 10CC  Final   Culture  Setup Time     Final   Value: 07/23/2014 20:37     Performed at [facility]   Culture     Final   Value: STREPTOCOCCUS GROUP G     Note: Gram Stain Report Called to,Read Back By and Verified With: JOANNA L@8 :50AM ON 07/24/14 BY DANTS     Performed at [facility]   Report Status PENDING   Incomplete  CULTURE, BLOOD (ROUTINE X 2)     Status: None   Collection Time    07/23/14  5:15 PM      Result Value Ref Range Status   Specimen Description BLOOD ARM RIGHT   Final   Special Requests BOTTLES DRAWN AEROBIC AND ANAEROBIC 10CC   Final   Culture  Setup Time     Final   Value: 07/23/2014 20:38     Performed at [facility]   Culture     Final   Value:        BLOOD CULTURE RECEIVED NO GROWTH TO DATE CULTURE WILL BE HELD FOR 5 DAYS BEFORE ISSUING A FINAL NEGATIVE REPORT     Performed at [facility]   Report Status PENDING   Incomplete  MRSA PCR SCREENING     Status: Abnormal   Collection Time    07/24/14 12:07 AM      Result Value Ref Range Status   MRSA by PCR POSITIVE (*) NEGATIVE Final   Comment:            The GeneXpert MRSA Assay (FDA     approved for NASAL specimens     only), is one component of a     comprehensive MRSA colonization     surveillance program. It is not     intended to diagnose MRSA     infection nor to guide or     monitor treatment for     MRSA infections.     RESULT CALLED TO, READ  BACK BY AND VERIFIED WITHEllison Hughs RN [id number] AT [phone] [facility]    Radiology Reports Dg Chest 2 View  07/23/2014   CLINICAL DATA:  Fever and shortness of breath.  Left leg cellulitis.  EXAM: CHEST  2 VIEW  COMPARISON:  PA and lateral chest 09/16/2011.  FINDINGS: The lungs are clear. Heart size is enlarged. No pneumothorax or pleural effusion. No focal bony abnormality.  IMPRESSION: Cardiomegaly without acute disease.   Electronically Signed   By: Inge Rise M.D.   On: 07/23/2014 18:12   Ct Angio Chest Pe W/cm &/or Wo Cm  07/23/2014   CLINICAL DATA:  Hypoxia with cough and shortness of breath.  EXAM: CT ANGIOGRAPHY CHEST WITH CONTRAST  TECHNIQUE: Multidetector CT imaging of the chest was performed using the standard protocol during bolus administration of intravenous contrast. Multiplanar CT image reconstructions and MIPs were obtained to evaluate the vascular anatomy.  CONTRAST:  70 cc Omnipaque 350  COMPARISON:  None.  FINDINGS: Markedly degraded study secondary to body habitus and patient inability to reproducibly breath hold. There is no evidence for thrombus in the main pulmonary outflow tract. Given the motion artifact and image noise, the study is nondiagnostic for detection of pulmonary embolus in the main pulmonary arteries and more distal branches.  No thoracic aortic aneurysm. The heart is enlarged. No pericardial or pleural effusion. No evidence for mediastinal lymphadenopathy.  Lung volumes are low and fine detail is distorted by breathing motion.  No dense airspace consolidation. Atelectasis is seen in the lower lungs bilaterally.  Bone windows reveal no worrisome lytic or sclerotic osseous lesions.  Review of the MIP images confirms the above findings.  IMPRESSION: Nondiagnostic exam for acute pulmonary embolus secondary to body habitus and inability to breath hold.  I personally discussed this study by telephone with the PA caring for the patient, Delos Haring, at 2047 hrs on  07/23/2014.   Electronically Signed   By: Misty Stanley M.D.   On: 07/23/2014 20:47   US Renal  07/25/2014   CLINICAL DATA:  Acute renal failure  EXAM: RENAL/URINARY TRACT ULTRASOUND COMPLETE  COMPARISON:  12/25/2012  FINDINGS: Right Kidney:  Length: 11.3. Echogenicity within normal limits. No mass or hydronephrosis visualized.  Left Kidney:  Length: 10.3. Echogenicity within normal limits. No mass or hydronephrosis visualized. Significantly limited evaluation of the left kidney due to body habitus.  Bladder:  Appears normal for degree of bladder distention.  IMPRESSION: Study is limited by body habitus which particularly limits evaluation of the left kidney, with left kidney seen and only limited detail. Study is otherwise normal.   Electronically Signed   By: Skipper Cliche M.D.   On: 07/25/2014 09:36   Nm Pulmonary Perf And Vent  07/24/2014   CLINICAL DATA:  Shortness of breath.  EXAM: NUCLEAR MEDICINE VENTILATION - PERFUSION LUNG SCAN  TECHNIQUE: Ventilation images were obtained in multiple projections using inhaled aerosol technetium 99 M DTPA. Perfusion images were obtained in multiple projections after intravenous injection of Tc-86m MAA.  RADIOPHARMACEUTICALS:  6 mCi Tc-6m DTPA aerosol and 40 mCi Tc-37m MAA  COMPARISON:  CT chest 07/23/2014.  FINDINGS: Ventilation: This study is limited due to the patient's size. Not all views could be obtained. No defect is identified.  Perfusion: Not all views could be obtained. No defect is identified.  IMPRESSION: Limited study demonstrating no evidence pulmonary embolus.   Electronically Signed   By: Inge Rise M.D.   On: 07/24/2014 14:13   Dg Tibia/fibula Left Port  07/24/2014   CLINICAL DATA:  Left lower leg pain, redness and swelling.  EXAM: PORTABLE LEFT TIBIA AND FIBULA - 2 VIEW  COMPARISON:  CT dated 12/25/2012.  FINDINGS: Diffuse soft tissue swelling. Unremarkable bones. No soft tissue gas.  IMPRESSION: Diffuse soft tissue swelling without  underlying bony abnormality.   Electronically Signed   By: Enrique Sack M.D.   On: 07/24/2014 01:05    CBC  Recent Labs Lab 07/23/14 1554 07/25/14 0340  WBC 9.9 10.6*  HGB 14.8 12.7  HCT 46.4* 41.7  PLT 190 146*  MCV 89.7 94.3  MCH 28.6 28.7  MCHC 31.9 30.5  RDW 16.2* 16.6*  LYMPHSABS 0.4*  --   MONOABS 0.4  --   EOSABS 0.0  --   BASOSABS 0.0  --     Chemistries   Recent Labs Lab 07/23/14 1845 07/25/14 0340  NA 140 136*  K 4.1 4.3  CL 97 97  CO2 28 26  GLUCOSE 132* 112*  BUN 19 30*  CREATININE 1.12* 1.70*  CALCIUM 9.4 8.3*  AST 17  --   ALT 14  --   ALKPHOS 58  --   BILITOT 0.9  --    ------------------------------------------------------------------------------------------------------------------ estimated creatinine clearance is 79.6 ml/min (by C-G formula based on Cr of 1.7). ------------------------------------------------------------------------------------------------------------------ No results found for this basename: HGBA1C,  in the last 72 hours ------------------------------------------------------------------------------------------------------------------ No results found for this basename: CHOL, HDL, LDLCALC, TRIG, CHOLHDL, LDLDIRECT,  in  the last 72 hours ------------------------------------------------------------------------------------------------------------------  Recent Labs  07/24/14 0348  TSH 1.390   ------------------------------------------------------------------------------------------------------------------ No results found for this basename: VITAMINB12, FOLATE, FERRITIN, TIBC, IRON, RETICCTPCT,  in the last 72 hours  Coagulation profile No results found for this basename: INR, PROTIME,  in the last 168 hours  No results found for this basename: DDIMER,  in the last 72 hours  Cardiac Enzymes  Recent Labs Lab 07/23/14 2230 07/24/14 0348 07/24/14 0843  TROPONINI <0.30 <0.30 <0.30    ------------------------------------------------------------------------------------------------------------------ No components found with this basename: POCBNP,      Time Spent in minutes   35   Caysie Minnifield K M.D on 07/25/2014 at 10:35 AM  Between 7am to 7pm - Pager - [phone]  After 7pm go to [email or web address] - password [email or web address]  And look for the night coverage person covering for me after hours  [facility] Office  [phone]   **Disclaimer: This note may have been dictated with voice recognition software. Similar sounding words can inadvertently be transcribed and this note may contain transcription errors which may not have been corrected upon publication of note.**

## 2014-07-25 NOTE — Consult Note (Signed)
Reason for Consult: Cellulitis bilateral lower extremities with massive venous stasis insufficiency. Referring Physician: Dr. Janit Pagan is an 33 y.o. female.  HPI: Patient is a 33 year old woman with morbid obesity who presents with cellulitis swelling bilateral lower extremities with venous stasis insufficiency. Patient states that she has seen vascular vein specialist and was suggested that a laser ablation of her veins may be helpful.  Past Medical History  Diagnosis Date  . Morbid obesity   . Hyperlipidemia   . Diabetes mellitus   . Chronic kidney disease   . GERD (gastroesophageal reflux disease)   . Sepsis 11/2012    Secondary to cellulitis  . Hypothyroidism   . Hypertension   . Anemia   . Recurrent cellulitis of lower leg     LLE, venous insuff  . OSA (obstructive sleep apnea)   . Obesity hypoventilation syndrome   . Morbid obesity with BMI of 70 and over, adult   . CARDIOMYOPATHY, DILATED   . Depression   . Venous insufficiency of leg     Past Surgical History  Procedure Laterality Date  . Wisdom tooth extraction      Family History  Problem Relation Age of Onset  . Hypertension Mother   . Hypertension Father   . Heart disease Father     before age 19    Social History:  reports that she quit smoking about 3 years ago. Her smoking use included Cigarettes. She smoked 0.00 packs per day for 10 years. She has never used smokeless tobacco. She reports that she does not drink alcohol or use illicit drugs.  Allergies:  Allergies  Allergen Reactions  . Aspirin     REACTION: throat swelling, hives  . Doxycycline Other (See Comments)    Abdominal pain  . Niaspan [Niacin Er]     Caused flushing    Medications: I have reviewed the patient's current medications.  Results for orders placed during the hospital encounter of 07/23/14 (from the past 48 hour(s))  CBC WITH DIFFERENTIAL     Status: Abnormal   Collection Time    07/23/14  3:54 PM      Result  Value Ref Range   WBC 9.9  4.0 - 10.5 K/uL   RBC 5.17 (*) 3.87 - 5.11 MIL/uL   Hemoglobin 14.8  12.0 - 15.0 g/dL   HCT 46.4 (*) 36.0 - 46.0 %   MCV 89.7  78.0 - 100.0 fL   MCH 28.6  26.0 - 34.0 pg   MCHC 31.9  30.0 - 36.0 g/dL   RDW 16.2 (*) 11.5 - 15.5 %   Platelets 190  150 - 400 K/uL   Neutrophils Relative % 91 (*) 43 - 77 %   Neutro Abs 9.0 (*) 1.7 - 7.7 K/uL   Lymphocytes Relative 5 (*) 12 - 46 %   Lymphs Abs 0.4 (*) 0.7 - 4.0 K/uL   Monocytes Relative 4  3 - 12 %   Monocytes Absolute 0.4  0.1 - 1.0 K/uL   Eosinophils Relative 0  0 - 5 %   Eosinophils Absolute 0.0  0.0 - 0.7 K/uL   Basophils Relative 0  0 - 1 %   Basophils Absolute 0.0  0.0 - 0.1 K/uL  I-STAT CG4 LACTIC ACID, ED     Status: Abnormal   Collection Time    07/23/14  4:51 PM      Result Value Ref Range   Lactic Acid, Venous 4.02 (*) 0.5 - 2.2 mmol/L  CULTURE, BLOOD (ROUTINE X 2)     Status: None   Collection Time    07/23/14  5:05 PM      Result Value Ref Range   Specimen Description BLOOD ARM LEFT     Special Requests BOTTLES DRAWN AEROBIC AND ANAEROBIC 10CC     Culture  Setup Time       Value: 07/23/2014 20:37     Performed at [facility]   Culture       Value: STREPTOCOCCUS GROUP G     Note: Gram Stain Report Called to,Read Back By and Verified With: JOANNA L'@8' :50AM ON 07/24/14 BY DANTS     Performed at [facility]   Report Status PENDING    CULTURE, BLOOD (ROUTINE X 2)     Status: None   Collection Time    07/23/14  5:15 PM      Result Value Ref Range   Specimen Description BLOOD ARM RIGHT     Special Requests BOTTLES DRAWN AEROBIC AND ANAEROBIC 10CC     Culture  Setup Time       Value: 07/23/2014 20:38     Performed at [facility]   Culture       Value:        BLOOD CULTURE RECEIVED NO GROWTH TO DATE CULTURE WILL BE HELD FOR 5 DAYS BEFORE ISSUING A FINAL NEGATIVE REPORT     Performed at [facility]   Report Status PENDING    COMPREHENSIVE METABOLIC PANEL      Status: Abnormal   Collection Time    07/23/14  6:45 PM      Result Value Ref Range   Sodium 140  137 - 147 mEq/L   Potassium 4.1  3.7 - 5.3 mEq/L   Chloride 97  96 - 112 mEq/L   CO2 28  19 - 32 mEq/L   Glucose, Bld 132 (*) 70 - 99 mg/dL   BUN 19  6 - 23 mg/dL   Creatinine, Ser 1.12 (*) 0.50 - 1.10 mg/dL   Calcium 9.4  8.4 - 10.5 mg/dL   Total Protein 6.9  6.0 - 8.3 g/dL   Albumin 3.4 (*) 3.5 - 5.2 g/dL   AST 17  0 - 37 U/L   ALT 14  0 - 35 U/L   Alkaline Phosphatase 58  39 - 117 U/L   Total Bilirubin 0.9  0.3 - 1.2 mg/dL   GFR calc non Af Amer 63 (*) >90 mL/min   GFR calc Af Amer 73 (*) >90 mL/min   Comment: (NOTE)     The eGFR has been calculated using the CKD EPI equation.     This calculation has not been validated in all clinical situations.     eGFR's persistently <90 mL/min signify possible Chronic Kidney     Disease.   Anion gap 15  5 - 15  I-STAT ARTERIAL BLOOD GAS, ED     Status: Abnormal   Collection Time    07/23/14 10:30 PM      Result Value Ref Range   pH, Arterial 7.351  7.350 - 7.450   pCO2 arterial 51.4 (*) 35.0 - 45.0 mmHg   pO2, Arterial 95.0  80.0 - 100.0 mmHg   Bicarbonate 28.4 (*) 20.0 - 24.0 mEq/L   TCO2 30  0 - 100 mmol/L   O2 Saturation 97.0     Acid-Base Excess 2.0  0.0 - 2.0 mmol/L   Patient temperature 98.6 F  Sample type ARTERIAL    TROPONIN I     Status: None   Collection Time    07/23/14 10:30 PM      Result Value Ref Range   Troponin I <0.30  <0.30 ng/mL   Comment:            Due to the release kinetics of cTnI,     a negative result within the first hours     of the onset of symptoms does not rule out     myocardial infarction with certainty.     If myocardial infarction is still suspected,     repeat the test at appropriate intervals.  CK     Status: None   Collection Time    07/23/14 10:30 PM      Result Value Ref Range   Total CK 63  7 - 177 U/L  SEDIMENTATION RATE     Status: None   Collection Time    07/23/14 10:30 PM       Result Value Ref Range   Sed Rate 8  0 - 22 mm/hr  C-REACTIVE PROTEIN     Status: Abnormal   Collection Time    07/23/14 10:30 PM      Result Value Ref Range   CRP 6.5 (*) <0.60 mg/dL   Comment: Performed at [facility] PCR SCREENING     Status: Abnormal   Collection Time    07/24/14 12:07 AM      Result Value Ref Range   MRSA by PCR POSITIVE (*) NEGATIVE   Comment:            The GeneXpert MRSA Assay (FDA     approved for NASAL specimens     only), is one component of a     comprehensive MRSA colonization     surveillance program. It is not     intended to diagnose MRSA     infection nor to guide or     monitor treatment for     MRSA infections.     RESULT CALLED TO, READ BACK BY AND VERIFIED WITH:     Ellison Hughs RN [id number] AT 0232 SKEEN,P  TROPONIN I     Status: None   Collection Time    07/24/14  3:48 AM      Result Value Ref Range   Troponin I <0.30  <0.30 ng/mL   Comment:            Due to the release kinetics of cTnI,     a negative result within the first hours     of the onset of symptoms does not rule out     myocardial infarction with certainty.     If myocardial infarction is still suspected,     repeat the test at appropriate intervals.  TSH     Status: None   Collection Time    07/24/14  3:48 AM      Result Value Ref Range   TSH 1.390  0.350 - 4.500 uIU/mL  CORTISOL-AM, BLOOD     Status: Abnormal   Collection Time    07/24/14  3:48 AM      Result Value Ref Range   Cortisol - AM 27.6 (*) 4.3 - 22.4 ug/dL   Comment: Performed at [facility]  TROPONIN I     Status: None   Collection Time    07/24/14  8:43 AM      Result Value Ref Range  Troponin I <0.30  <0.30 ng/mL   Comment:            Due to the release kinetics of cTnI,     a negative result within the first hours     of the onset of symptoms does not rule out     myocardial infarction with certainty.     If myocardial infarction is still suspected,     repeat the  test at appropriate intervals.  CBC     Status: Abnormal   Collection Time    07/25/14  3:40 AM      Result Value Ref Range   WBC 10.6 (*) 4.0 - 10.5 K/uL   RBC 4.42  3.87 - 5.11 MIL/uL   Hemoglobin 12.7  12.0 - 15.0 g/dL   HCT 41.7  36.0 - 46.0 %   MCV 94.3  78.0 - 100.0 fL   MCH 28.7  26.0 - 34.0 pg   MCHC 30.5  30.0 - 36.0 g/dL   RDW 16.6 (*) 11.5 - 15.5 %   Platelets 146 (*) 150 - 400 K/uL   Comment: DELTA CHECK NOTED     REPEATED TO VERIFY     SPECIMEN CHECKED FOR CLOTS  BASIC METABOLIC PANEL     Status: Abnormal   Collection Time    07/25/14  3:40 AM      Result Value Ref Range   Sodium 136 (*) 137 - 147 mEq/L   Potassium 4.3  3.7 - 5.3 mEq/L   Chloride 97  96 - 112 mEq/L   CO2 26  19 - 32 mEq/L   Glucose, Bld 112 (*) 70 - 99 mg/dL   BUN 30 (*) 6 - 23 mg/dL   Comment: DELTA CHECK NOTED   Creatinine, Ser 1.70 (*) 0.50 - 1.10 mg/dL   Comment: DELTA CHECK NOTED   Calcium 8.3 (*) 8.4 - 10.5 mg/dL   GFR calc non Af Amer 38 (*) >90 mL/min   GFR calc Af Amer 44 (*) >90 mL/min   Comment: (NOTE)     The eGFR has been calculated using the CKD EPI equation.     This calculation has not been validated in all clinical situations.     eGFR's persistently <90 mL/min signify possible Chronic Kidney     Disease.   Anion gap 13  5 - 15  CREATININE, URINE, RANDOM     Status: None   Collection Time    07/25/14 10:40 AM      Result Value Ref Range   Creatinine, Urine 163.16    URINALYSIS, ROUTINE W REFLEX MICROSCOPIC     Status: Abnormal   Collection Time    07/25/14 10:40 AM      Result Value Ref Range   Color, Urine YELLOW  YELLOW   APPearance TURBID (*) CLEAR   Specific Gravity, Urine 1.026  1.005 - 1.030   pH 5.0  5.0 - 8.0   Glucose, UA NEGATIVE  NEGATIVE mg/dL   Hgb urine dipstick NEGATIVE  NEGATIVE   Bilirubin Urine NEGATIVE  NEGATIVE   Ketones, ur NEGATIVE  NEGATIVE mg/dL   Protein, ur 30 (*) NEGATIVE mg/dL   Urobilinogen, UA 0.2  0.0 - 1.0 mg/dL   Nitrite NEGATIVE   NEGATIVE   Leukocytes, UA SMALL (*) NEGATIVE  SODIUM, URINE, RANDOM     Status: None   Collection Time    07/25/14 10:40 AM      Result Value Ref Range   Sodium, Ur <20  URINE MICROSCOPIC-ADD ON     Status: Abnormal   Collection Time    07/25/14 10:40 AM      Result Value Ref Range   Squamous Epithelial / LPF FEW (*) RARE   WBC, UA 7-10  <3 WBC/hpf   RBC / HPF 0-2  <3 RBC/hpf   Bacteria, UA FEW (*) RARE   Urine-Other AMORPHOUS URATES/PHOSPHATES      Dg Chest 2 View  07/23/2014   CLINICAL DATA:  Fever and shortness of breath.  Left leg cellulitis.  EXAM: CHEST  2 VIEW  COMPARISON:  PA and lateral chest 09/16/2011.  FINDINGS: The lungs are clear. Heart size is enlarged. No pneumothorax or pleural effusion. No focal bony abnormality.  IMPRESSION: Cardiomegaly without acute disease.   Electronically Signed   By: Inge Rise M.D.   On: 07/23/2014 18:12   Ct Angio Chest Pe W/cm &/or Wo Cm  07/23/2014   CLINICAL DATA:  Hypoxia with cough and shortness of breath.  EXAM: CT ANGIOGRAPHY CHEST WITH CONTRAST  TECHNIQUE: Multidetector CT imaging of the chest was performed using the standard protocol during bolus administration of intravenous contrast. Multiplanar CT image reconstructions and MIPs were obtained to evaluate the vascular anatomy.  CONTRAST:  70 cc Omnipaque 350  COMPARISON:  None.  FINDINGS: Markedly degraded study secondary to body habitus and patient inability to reproducibly breath hold. There is no evidence for thrombus in the main pulmonary outflow tract. Given the motion artifact and image noise, the study is nondiagnostic for detection of pulmonary embolus in the main pulmonary arteries and more distal branches.  No thoracic aortic aneurysm. The heart is enlarged. No pericardial or pleural effusion. No evidence for mediastinal lymphadenopathy.  Lung volumes are low and fine detail is distorted by breathing motion. No dense airspace consolidation. Atelectasis is seen in the lower  lungs bilaterally.  Bone windows reveal no worrisome lytic or sclerotic osseous lesions.  Review of the MIP images confirms the above findings.  IMPRESSION: Nondiagnostic exam for acute pulmonary embolus secondary to body habitus and inability to breath hold.  I personally discussed this study by telephone with the PA caring for the patient, Delos Haring, at 2047 hrs on 07/23/2014.   Electronically Signed   By: Misty Stanley M.D.   On: 07/23/2014 20:47   US Renal  07/25/2014   CLINICAL DATA:  Acute renal failure  EXAM: RENAL/URINARY TRACT ULTRASOUND COMPLETE  COMPARISON:  12/25/2012  FINDINGS: Right Kidney:  Length: 11.3. Echogenicity within normal limits. No mass or hydronephrosis visualized.  Left Kidney:  Length: 10.3. Echogenicity within normal limits. No mass or hydronephrosis visualized. Significantly limited evaluation of the left kidney due to body habitus.  Bladder:  Appears normal for degree of bladder distention.  IMPRESSION: Study is limited by body habitus which particularly limits evaluation of the left kidney, with left kidney seen and only limited detail. Study is otherwise normal.   Electronically Signed   By: Skipper Cliche M.D.   On: 07/25/2014 09:36   Nm Pulmonary Perf And Vent  07/24/2014   CLINICAL DATA:  Shortness of breath.  EXAM: NUCLEAR MEDICINE VENTILATION - PERFUSION LUNG SCAN  TECHNIQUE: Ventilation images were obtained in multiple projections using inhaled aerosol technetium 99 M DTPA. Perfusion images were obtained in multiple projections after intravenous injection of Tc-89mMAA.  RADIOPHARMACEUTICALS:  6 mCi Tc-917mTPA aerosol and 40 mCi Tc-9957mA  COMPARISON:  CT chest 07/23/2014.  FINDINGS: Ventilation: This study is limited due to the patient's  size. Not all views could be obtained. No defect is identified.  Perfusion: Not all views could be obtained. No defect is identified.  IMPRESSION: Limited study demonstrating no evidence pulmonary embolus.   Electronically Signed    By: Inge Rise M.D.   On: 07/24/2014 14:13   Dg Tibia/fibula Left Port  07/24/2014   CLINICAL DATA:  Left lower leg pain, redness and swelling.  EXAM: PORTABLE LEFT TIBIA AND FIBULA - 2 VIEW  COMPARISON:  CT dated 12/25/2012.  FINDINGS: Diffuse soft tissue swelling. Unremarkable bones. No soft tissue gas.  IMPRESSION: Diffuse soft tissue swelling without underlying bony abnormality.   Electronically Signed   By: Enrique Sack M.D.   On: 07/24/2014 01:05    Review of Systems  All other systems reviewed and are negative.  Blood pressure 121/42, pulse 73, temperature 98.1 F (36.7 C), temperature source Oral, resp. rate 14, height '5\' 2"'  (1.575 m), weight 195.047 kg (430 lb), last menstrual period 07/09/2014, SpO2 95.00%. Physical Exam On examination patient has cellulitis involving the entire left lower extremity with smaller areas of cellulitis involving the right lower extremity. Patient has massive swelling in the left leg compared to the right leg. There no plantar ulcers no weeping edema no venous stasis open ulcers. Assessment/Plan: Assessment: Morbid obesity with cellulitis bilateral lower extremities with massive venous stasis insufficiency worse on the left than the right.  Plan: I instructed the patient to keep her feet elevated above her heart at all times. We will have an Unna compressive wraps applied today bilateral. Plan for bilateral Profore wraps on Monday with the wound care nursing. Continue IV antibiotics. I will followup as an outpatient.  Menna Abeln V 07/25/2014, 2:46 PM

## 2014-07-26 LAB — URINE CULTURE
COLONY COUNT: NO GROWTH
CULTURE: NO GROWTH

## 2014-07-26 LAB — BASIC METABOLIC PANEL
ANION GAP: 11 (ref 5–15)
BUN: 22 mg/dL (ref 6–23)
CALCIUM: 8.3 mg/dL — AB (ref 8.4–10.5)
CHLORIDE: 98 meq/L (ref 96–112)
CO2: 28 mEq/L (ref 19–32)
CREATININE: 1.3 mg/dL — AB (ref 0.50–1.10)
GFR calc Af Amer: 61 mL/min — ABNORMAL LOW (ref >90)
GFR calc non Af Amer: 53 mL/min — ABNORMAL LOW (ref >90)
GLUCOSE: 140 mg/dL — AB (ref 70–99)
Potassium: 3.9 mEq/L (ref 3.7–5.3)
SODIUM: 137 meq/L (ref 137–147)

## 2014-07-26 LAB — CBC
HCT: 37 % (ref 36.0–46.0)
HEMOGLOBIN: 11.3 g/dL — AB (ref 12.0–15.0)
MCH: 28.3 pg (ref 26.0–34.0)
MCHC: 30.5 g/dL (ref 30.0–36.0)
MCV: 92.7 fL (ref 78.0–100.0)
PLATELETS: 154 10*3/uL (ref 150–400)
RBC: 3.99 MIL/uL (ref 3.87–5.11)
RDW: 16.1 % — ABNORMAL HIGH (ref 11.5–15.5)
WBC: 8.1 10*3/uL (ref 4.0–10.5)

## 2014-07-26 LAB — CULTURE, BLOOD (ROUTINE X 2)

## 2014-07-26 MED ORDER — ACETAMINOPHEN 325 MG PO TABS
650.0000 mg | ORAL_TABLET | Freq: Four times a day (QID) | ORAL | Status: DC | PRN
  Administered 2014-07-26 – 2014-07-27 (×2): 650 mg via ORAL
  Filled 2014-07-26 (×3): qty 2

## 2014-07-26 NOTE — Progress Notes (Signed)
Patient Demographics  Alexandra Beck, is a 33 y.o. female, DOB - Feb 05, 1981, XHB:[id number]  Admit date - 07/23/2014   Admitting Physician Berle Mull, MD  Outpatient Primary MD for the patient is Gwendolyn Grant, MD  LOS - 3   Chief Complaint  Patient presents with  . Cellulitis    The patient said she has been seen here and at [facility] for the same thing.  The patient has been hospitalized for the same thing in the past.        Subjective:   Alexandra Beck today has, No headache, No chest pain, No abdominal pain - No Nausea, No new weakness tingling or numbness, No Cough - SOB. Improved pain and discomfort in the right leg.  Assessment & Plan    1. Sepsis due to cellulitis in the right lower extremity. Cultures noted, one out of 2 sets growing Streptococcus group G., discussed antibiotic options with ID over the phone, will downgrade her to IV Unasyn for now, once improved okay to switch to oral antibiotic per ID for total of 14 days. Right lower extremity venous duplex negative, there is nonspecific soft tissue fluid action on ultrasound and seen by orthopedic surgeon Dr. Sharol Given who recommends UNA boots which have been placed.   2. Acute on chronic shortness of breath. Worsening exercise capacity. Likely due to severe underlying morbid obesity with BMI of 70. CT of the chest inconclusive to rule out PE, lower extremity venous duplex negative, VQ scan unremarkable, echogram shows chronic diastolic CHF. Negative serial troponins. Likely shortness of breath obesity-related. Post discharge pulmonary followup.    3. Hypothyroidism. Continue home dose Synthroid , stable baseline TSH of 1.3.    4. Dyslipidemia continue home dose statin.    5. Essential hypertension - pressure soft hold HCTZ,  Lopressor and Norvasc.    6. OSA. CPAP and in bed. Patient encouraged to increase activity and sit in the recliner during the daytime.      7. ARF. Likely due to sepsis and transient hypotension in the day of admission, she also received IV contrast to rule out PE. Continue to hydrate her, avoid nephrotoxins, renal ultrasound stable, renal function much better and close to baseline now. Renal input appreciated.     Code Status: Full  Family Communication: None present  Disposition Plan: To be decided   Procedures CT angiogram chest inconclusive for PE, VQ scan, echogram, venous duplex right lower extremity, ABI right lower extremity  TTE  - Left ventricle: The cavity size was dilated. Wall thickness was increased. Systolic function was probably normal. The estimated ejection fraction was = 50%. - Left atrium: The atrium was dilated. - Right ventricle: Systolic function was reduced. - Right atrium: The atrium was dilated. - Pericardium, extracardiac: A small pericardial effusion was identified.   Renal ultrasound  Study is limited by body habitus which particularly limits evaluation of the left kidney, with left kidney seen and only limited detail. Study is otherwise normal.    Consults  orthopedics Dr. Sharol Given, renal, ID Dr. Linus Salmons on phone   Medications  Scheduled Meds: . amLODipine  5 mg Oral Daily  . ampicillin-sulbactam (UNASYN) IV  3 g Intravenous Q6H  . antiseptic oral rinse  7 mL Mouth  Rinse BID  . atorvastatin  10 mg Oral q1800  . Chlorhexidine Gluconate Cloth  6 each Topical Q0600  . enoxaparin (LOVENOX) injection  100 mg Subcutaneous Q24H  . guaiFENesin  600 mg Oral BID  . levothyroxine  150 mcg Oral QAC breakfast  . metoprolol  50 mg Oral BID  . mupirocin ointment  1 application Nasal BID  . sodium chloride  3 mL Intravenous Q12H   Continuous Infusions:   PRN Meds:.acetaminophen, acetaminophen, morphine injection, ondansetron (ZOFRAN) IV,  oxyCODONE  DVT Prophylaxis  Lovenox   Lab Results  Component Value Date   PLT 154 07/26/2014    Antibiotics     Anti-infectives   Start     Dose/Rate Route Frequency Ordered Stop   07/25/14 1200  Ampicillin-Sulbactam (UNASYN) 3 g in sodium chloride 0.9 % 100 mL IVPB     3 g 100 mL/hr over 60 Minutes Intravenous Every 6 hours 07/25/14 1056     07/24/14 1800  vancomycin (VANCOCIN) 1,500 mg in sodium chloride 0.9 % 500 mL IVPB  Status:  Discontinued     1,500 mg 250 mL/hr over 120 Minutes Intravenous Every 12 hours 07/23/14 2247 07/25/14 1034   07/23/14 2300  vancomycin (VANCOCIN) IVPB 1000 mg/200 mL premix     1,000 mg 200 mL/hr over 60 Minutes Intravenous  Once 07/23/14 2247 07/24/14 0103   07/23/14 2300  piperacillin-tazobactam (ZOSYN) IVPB 3.375 g  Status:  Discontinued     3.375 g 12.5 mL/hr over 240 Minutes Intravenous 3 times per day 07/23/14 2247 07/25/14 1056   07/23/14 2115  piperacillin-tazobactam (ZOSYN) IVPB 3.375 g     3.375 g 12.5 mL/hr over 240 Minutes Intravenous  Once 07/23/14 2106 07/24/14 0130   07/23/14 1700  vancomycin (VANCOCIN) IVPB 1000 mg/200 mL premix  Status:  Discontinued     1,000 mg 200 mL/hr over 60 Minutes Intravenous  Once 07/23/14 1656 07/23/14 1657   07/23/14 1700  vancomycin (VANCOCIN) IVPB 1000 mg/200 mL premix    Comments:  DO NOT START UNTIL BLOOD CULTURES HAVE BEEN DRAWN.   1,000 mg 200 mL/hr over 60 Minutes Intravenous  Once 07/23/14 1657 07/23/14 1917          Objective:   Filed Vitals:   07/26/14 0727 07/26/14 0800 07/26/14 0900 07/26/14 1000  BP: 132/65 131/76 142/76   Pulse: 69 67 67   Temp: 99 F (37.2 C)     TempSrc: Axillary     Resp: 11 11 11    Height:      Weight:    212.9 kg (469 lb 5.8 oz)  SpO2: 91% 93% 93%     Wt Readings from Last 3 Encounters:  07/26/14 212.9 kg (469 lb 5.8 oz)  08/14/13 182.709 kg (402 lb 12.8 oz)  08/02/13 181.348 kg (399 lb 12.8 oz)     Intake/Output Summary (Last 24 hours) at  07/26/14 1107 Last data filed at 07/26/14 1000  Gross per 24 hour  Intake 2033.75 ml  Output   2300 ml  Net -266.25 ml     Physical Exam  Awake Alert, Oriented X 3, No new F.N deficits, Normal affect [place].AT,PERRAL Supple Neck,No JVD, No cervical lymphadenopathy appriciated.  Symmetrical Chest wall movement, Good air movement bilaterally, CTAB RRR,No Gallops,Rubs or new Murmurs, No Parasternal Heave +ve B.Sounds, Abd Soft, No tenderness, No organomegaly appriciated, No rebound - guarding or rigidity. No Cyanosis, Clubbing or edema, No new Rash or bruise , right leg has frank cellulitis below the  knee, minimal fluctuance behind the left calf   Data Review   Micro Results Recent Results (from the past 240 hour(s))  CULTURE, BLOOD (ROUTINE X 2)     Status: None   Collection Time    07/23/14  5:05 PM      Result Value Ref Range Status   Specimen Description BLOOD ARM LEFT   Final   Special Requests BOTTLES DRAWN AEROBIC AND ANAEROBIC 10CC   Final   Culture  Setup Time     Final   Value: 07/23/2014 20:37     Performed at [facility]   Culture     Final   Value: STREPTOCOCCUS GROUP G     Note: Gram Stain Report Called to,Read Back By and Verified With: JOANNA L@8 :50AM ON 07/24/14 BY DANTS     Performed at [facility]   Report Status 07/26/2014 FINAL   Final   Organism ID, Bacteria STREPTOCOCCUS GROUP G   Final  CULTURE, BLOOD (ROUTINE X 2)     Status: None   Collection Time    07/23/14  5:15 PM      Result Value Ref Range Status   Specimen Description BLOOD ARM RIGHT   Final   Special Requests BOTTLES DRAWN AEROBIC AND ANAEROBIC 10CC   Final   Culture  Setup Time     Final   Value: 07/23/2014 20:38     Performed at [facility]   Culture     Final   Value:        BLOOD CULTURE RECEIVED NO GROWTH TO DATE CULTURE WILL BE HELD FOR 5 DAYS BEFORE ISSUING A FINAL NEGATIVE REPORT     Performed at [facility]   Report Status PENDING   Incomplete   MRSA PCR SCREENING     Status: Abnormal   Collection Time    07/24/14 12:07 AM      Result Value Ref Range Status   MRSA by PCR POSITIVE (*) NEGATIVE Final   Comment:            The GeneXpert MRSA Assay (FDA     approved for NASAL specimens     only), is one component of a     comprehensive MRSA colonization     surveillance program. It is not     intended to diagnose MRSA     infection nor to guide or     monitor treatment for     MRSA infections.     RESULT CALLED TO, READ BACK BY AND VERIFIED WITH:     Ellison Hughs RN [id number] AT [phone] SKEEN,P    Radiology Reports US Renal  07/25/2014   CLINICAL DATA:  Acute renal failure  EXAM: RENAL/URINARY TRACT ULTRASOUND COMPLETE  COMPARISON:  12/25/2012  FINDINGS: Right Kidney:  Length: 11.3. Echogenicity within normal limits. No mass or hydronephrosis visualized.  Left Kidney:  Length: 10.3. Echogenicity within normal limits. No mass or hydronephrosis visualized. Significantly limited evaluation of the left kidney due to body habitus.  Bladder:  Appears normal for degree of bladder distention.  IMPRESSION: Study is limited by body habitus which particularly limits evaluation of the left kidney, with left kidney seen and only limited detail. Study is otherwise normal.   Electronically Signed   By: Skipper Cliche M.D.   On: 07/25/2014 09:36   Nm Pulmonary Perf And Vent  07/24/2014   CLINICAL DATA:  Shortness of breath.  EXAM: NUCLEAR MEDICINE VENTILATION - PERFUSION LUNG SCAN  TECHNIQUE:  Ventilation images were obtained in multiple projections using inhaled aerosol technetium 99 M DTPA. Perfusion images were obtained in multiple projections after intravenous injection of Tc-42m MAA.  RADIOPHARMACEUTICALS:  6 mCi Tc-34m DTPA aerosol and 40 mCi Tc-91m MAA  COMPARISON:  CT chest 07/23/2014.  FINDINGS: Ventilation: This study is limited due to the patient's size. Not all views could be obtained. No defect is identified.  Perfusion: Not all views could be  obtained. No defect is identified.  IMPRESSION: Limited study demonstrating no evidence pulmonary embolus.   Electronically Signed   By: Inge Rise M.D.   On: 07/24/2014 14:13    CBC  Recent Labs Lab 07/23/14 1554 07/25/14 0340 07/26/14 0328  WBC 9.9 10.6* 8.1  HGB 14.8 12.7 11.3*  HCT 46.4* 41.7 37.0  PLT 190 146* 154  MCV 89.7 94.3 92.7  MCH 28.6 28.7 28.3  MCHC 31.9 30.5 30.5  RDW 16.2* 16.6* 16.1*  LYMPHSABS 0.4*  --   --   MONOABS 0.4  --   --   EOSABS 0.0  --   --   BASOSABS 0.0  --   --     Chemistries   Recent Labs Lab 07/23/14 1845 07/25/14 0340 07/26/14 0328  NA 140 136* 137  K 4.1 4.3 3.9  CL 97 97 98  CO2 28 26 28   GLUCOSE 132* 112* 140*  BUN 19 30* 22  CREATININE 1.12* 1.70* 1.30*  CALCIUM 9.4 8.3* 8.3*  AST 17  --   --   ALT 14  --   --   ALKPHOS 58  --   --   BILITOT 0.9  --   --    ------------------------------------------------------------------------------------------------------------------ estimated creatinine clearance is 110.9 ml/min (by C-G formula based on Cr of 1.3). ------------------------------------------------------------------------------------------------------------------ No results found for this basename: HGBA1C,  in the last 72 hours ------------------------------------------------------------------------------------------------------------------ No results found for this basename: CHOL, HDL, LDLCALC, TRIG, CHOLHDL, LDLDIRECT,  in the last 72 hours ------------------------------------------------------------------------------------------------------------------  Recent Labs  07/24/14 0348  TSH 1.390   ------------------------------------------------------------------------------------------------------------------ No results found for this basename: VITAMINB12, FOLATE, FERRITIN, TIBC, IRON, RETICCTPCT,  in the last 72 hours  Coagulation profile No results found for this basename: INR, PROTIME,  in the last 168  hours  No results found for this basename: DDIMER,  in the last 72 hours  Cardiac Enzymes  Recent Labs Lab 07/23/14 2230 07/24/14 0348 07/24/14 0843  TROPONINI <0.30 <0.30 <0.30   ------------------------------------------------------------------------------------------------------------------ No components found with this basename: POCBNP,      Time Spent in minutes   35   SINGH,PRASHANT K M.D on 07/26/2014 at 11:07 AM  Between 7am to 7pm - Pager - [phone]  After 7pm go to [email or web address] - password [email or web address]  And look for the night coverage person covering for me after hours  [facility] Office  [phone]   **Disclaimer: This note may have been dictated with voice recognition software. Similar sounding words can inadvertently be transcribed and this note may contain transcription errors which may not have been corrected upon publication of note.**

## 2014-07-26 NOTE — ED Provider Notes (Signed)
I performed an examination on the patient including cardiac, pulmonary, and gi systems which were unremarkable Medical screening examination/treatment/procedure(s) were conducted as a shared visit with non-physician practitioner(s) and myself.  I personally evaluated the patient during the encounter.   EKG Interpretation   Date/Time:  Wednesday July 23 2014 21:27:23 EDT Ventricular Rate:  70 PR Interval:  156 QRS Duration: 85 QT Interval:  419 QTC Calculation: 452 R Axis:   31 Text Interpretation:  Sinus rhythm Low voltage, precordial leads ED  PHYSICIAN INTERPRETATION AVAILABLE IN [facility] Confirmed by TEST,  Record ([id number]) on 07/25/2014 7:23:06 AM       Briefly patient presented with fairly sudden onset left lower terminates swelling, erythema, and pain.  She is poorly ambulatory secondary to her morbid obesity, however denies history of DVT or PE. She was found to be hypoxic at 88%, and febrile. No obvious initial source of cellulitic spread, however leg was warm, with intact pulses. The patient was placed on oxygen and due to concern for DVT was admitted to hospitalist after her CT PE scan was inconclusive due to size.  Debby Freiberg, MD 07/26/14 [phone]

## 2014-07-26 NOTE — Progress Notes (Signed)
Report called to [place] on 5W. States will call when room ready.. Having to move a pt out and clean room again  to [facility] the bariatric bed. Susie Cassette RN

## 2014-07-26 NOTE — Progress Notes (Signed)
Removed BiPAP from patients room and placed patient on bedtime cpap. Patient tolerating well, increased 02 from 3l to 6 L for low sats. Patient doing well.

## 2014-07-26 NOTE — Progress Notes (Signed)
Alexandra Beck is a 33 y.o. female patient who transferred  from 3S awake, alert  & orientated  X 3, Full Code, VSS - Blood pressure 114/54, pulse 55, temperature 98 F (36.7 C), temperature source Oral, resp. rate 11, height 5\' 2"  (1.575 m), weight 212.9 kg (469 lb 5.8 oz), last menstrual period 07/09/2014, SpO2 97.00%., O2   @ 3 L nasal cannular, no c/o shortness of breath, no c/o chest pain, no distress noted. Tele # 20 placed and pt is currently running:normal sinus rhythm 61.   IV site WDL: forearm left, condition patent, no redness and swelling with a transparent dsg that's clean dry and intact.  Allergies:   Allergies  Allergen Reactions  . Aspirin     REACTION: throat swelling, hives  . Doxycycline Other (See Comments)    Abdominal pain  . Niaspan [Niacin Er]     Caused flushing     Past Medical History  Diagnosis Date  . Morbid obesity   . Hyperlipidemia   . Diabetes mellitus   . Chronic kidney disease   . GERD (gastroesophageal reflux disease)   . Sepsis 11/2012    Secondary to cellulitis  . Hypothyroidism   . Hypertension   . Anemia   . Recurrent cellulitis of lower leg     LLE, venous insuff  . OSA (obstructive sleep apnea)   . Obesity hypoventilation syndrome   . Morbid obesity with BMI of 70 and over, adult   . CARDIOMYOPATHY, DILATED   . Depression   . Venous insufficiency of leg     Pt orientation to unit, room and routine. SR up x 2, fall risk assessment complete with Patient and family verbalizing understanding of risks associated with falls. Pt verbalizes an understanding of how to use the call bell and to call for help before getting out of bed.  Skin, clean-dry- intact without evidence of bruising, or skin tears.   No evidence of skin break down noted on exam. no rashes    Will cont to monitor and assist as needed.  Lindalou Hose, RN 07/26/2014 6:35 PM

## 2014-07-26 NOTE — Progress Notes (Signed)
Patient ID: CHARLYNN SALIH, female   DOB: [birth year], 33 y.o.   MRN: [id number]  [facility] Progress Note    Assessment/ Plan:   1. Acute renal failure: From ATN (hypotension) and CINP. Urine output appears to be satisfactory and renal function improving. No critical labs noted at this time. Continue to monitor for renal recovery.  2. Cellulitis with sepsis: Group G. streptococcus growing from blood stream, on appropriate antibiotic at renal doses.  3. Hyponatremia: Mild from defect of water handling in acute renal failure, continue to monitor with renal recovery.  4. Acute on chronic dyspnea: Suspected that there might be a large component from obesity hypoventilation syndrome and she may benefit from positive pressure ventilation. CT scan was done for PE however, nondiagnostic the to the patient's habitus and her inability to hold her breath.  5. Hypertension: Currently with low normal blood pressures, continue to hold antihypertensive therapy.  Will sign off at this time- re-consult if needed  Subjective:   Reports come improvement in leg pain and denies any emerging complaints   Objective:   BP 131/76  Pulse 67  Temp(Src) 99 F (37.2 C) (Axillary)  Resp 11  Ht 5\' 2"  (1.575 m)  Wt 195.047 kg (430 lb)  BMI 78.63 kg/m2  SpO2 93%  LMP 07/09/2014  Intake/Output Summary (Last 24 hours) at 07/26/14 0913 Last data filed at 07/26/14 0303  Gross per 24 hour  Intake 2033.75 ml  Output   1500 ml  Net 533.75 ml   Weight change:   Physical Exam: Gen: resting comfortably in bed HYQ:MVHQI RRR, normal s1 and s2 Resp:CTA bilaterally, no rales/rhonchi ONG:EXBM, obese, NT, BS normal WUX:[id number] LEs in profore wraps for venous insufficiency  Imaging: US Renal  07/25/2014   CLINICAL DATA:  Acute renal failure  EXAM: RENAL/URINARY TRACT ULTRASOUND COMPLETE  COMPARISON:  12/25/2012  FINDINGS: Right Kidney:  Length: 11.3. Echogenicity within normal limits. No mass or  hydronephrosis visualized.  Left Kidney:  Length: 10.3. Echogenicity within normal limits. No mass or hydronephrosis visualized. Significantly limited evaluation of the left kidney due to body habitus.  Bladder:  Appears normal for degree of bladder distention.  IMPRESSION: Study is limited by body habitus which particularly limits evaluation of the left kidney, with left kidney seen and only limited detail. Study is otherwise normal.   Electronically Signed   By: Skipper Cliche M.D.   On: 07/25/2014 09:36   Nm Pulmonary Perf And Vent  07/24/2014   CLINICAL DATA:  Shortness of breath.  EXAM: NUCLEAR MEDICINE VENTILATION - PERFUSION LUNG SCAN  TECHNIQUE: Ventilation images were obtained in multiple projections using inhaled aerosol technetium 99 M DTPA. Perfusion images were obtained in multiple projections after intravenous injection of Tc-60m MAA.  RADIOPHARMACEUTICALS:  6 mCi Tc-32m DTPA aerosol and 40 mCi Tc-33m MAA  COMPARISON:  CT chest 07/23/2014.  FINDINGS: Ventilation: This study is limited due to the patient's size. Not all views could be obtained. No defect is identified.  Perfusion: Not all views could be obtained. No defect is identified.  IMPRESSION: Limited study demonstrating no evidence pulmonary embolus.   Electronically Signed   By: Inge Rise M.D.   On: 07/24/2014 14:13    Labs: BMET  Recent Labs Lab 07/23/14 1845 07/25/14 0340 07/26/14 0328  NA 140 136* 137  K 4.1 4.3 3.9  CL 97 97 98  CO2 28 26 28   GLUCOSE 132* 112* 140*  BUN 19 30* 22  CREATININE 1.12* 1.70* 1.30*  CALCIUM 9.4 8.3* 8.3*   CBC  Recent Labs Lab 07/23/14 1554 07/25/14 0340 07/26/14 0328  WBC 9.9 10.6* 8.1  NEUTROABS 9.0*  --   --   HGB 14.8 12.7 11.3*  HCT 46.4* 41.7 37.0  MCV 89.7 94.3 92.7  PLT 190 146* 154    Medications:    . amLODipine  5 mg Oral Daily  . ampicillin-sulbactam (UNASYN) IV  3 g Intravenous Q6H  . antiseptic oral rinse  7 mL Mouth Rinse BID  . atorvastatin  10 mg  Oral q1800  . Chlorhexidine Gluconate Cloth  6 each Topical Q0600  . enoxaparin (LOVENOX) injection  100 mg Subcutaneous Q24H  . guaiFENesin  600 mg Oral BID  . levothyroxine  150 mcg Oral QAC breakfast  . metoprolol  50 mg Oral BID  . mupirocin ointment  1 application Nasal BID  . sodium chloride  3 mL Intravenous Q12H   Elmarie Shiley, MD 07/26/2014, 9:13 AM

## 2014-07-27 LAB — CBC
HCT: 37.5 % (ref 36.0–46.0)
HEMOGLOBIN: 11.3 g/dL — AB (ref 12.0–15.0)
MCH: 28.3 pg (ref 26.0–34.0)
MCHC: 30.1 g/dL (ref 30.0–36.0)
MCV: 94 fL (ref 78.0–100.0)
Platelets: 135 10*3/uL — ABNORMAL LOW (ref 150–400)
RBC: 3.99 MIL/uL (ref 3.87–5.11)
RDW: 16 % — AB (ref 11.5–15.5)
WBC: 6 10*3/uL (ref 4.0–10.5)

## 2014-07-27 LAB — BASIC METABOLIC PANEL
Anion gap: 12 (ref 5–15)
BUN: 19 mg/dL (ref 6–23)
CHLORIDE: 96 meq/L (ref 96–112)
CO2: 29 mEq/L (ref 19–32)
CREATININE: 1.19 mg/dL — AB (ref 0.50–1.10)
Calcium: 9.1 mg/dL (ref 8.4–10.5)
GFR calc Af Amer: 68 mL/min — ABNORMAL LOW (ref >90)
GFR calc non Af Amer: 59 mL/min — ABNORMAL LOW (ref >90)
GLUCOSE: 115 mg/dL — AB (ref 70–99)
POTASSIUM: 4.5 meq/L (ref 3.7–5.3)
Sodium: 137 mEq/L (ref 137–147)

## 2014-07-27 NOTE — Progress Notes (Signed)
Patient Demographics  Alexandra Beck, is a 33 y.o. female, DOB - 01-Jul-1981, SVX:[id number]  Admit date - 07/23/2014   Admitting Physician Berle Mull, MD  Outpatient Primary MD for the patient is Gwendolyn Grant, MD  LOS - 4   Chief Complaint  Patient presents with  . Cellulitis    The patient said she has been seen here and at [facility] for the same thing.  The patient has been hospitalized for the same thing in the past.        Subjective:   Alexandra Beck today has, No headache, No chest pain, No abdominal pain - No Nausea, No new weakness tingling or numbness, No Cough - SOB.   Improved pain and discomfort in the right leg.   Assessment & Plan    1. Sepsis due to cellulitis in the right lower extremity. Cultures noted, one out of 2 sets growing Streptococcus group G., discussed antibiotic options with ID over the phone, will downgrade her to IV Unasyn for now, once improved okay to switch to oral antibiotic per ID for total of 14 days. Right lower extremity venous duplex negative, there is nonspecific soft tissue fluid action on ultrasound and seen by orthopedic surgeon Dr. Sharol Given who recommends UNA boots which have been placed, clinically better.   2. Acute on chronic shortness of breath. Worsening exercise capacity. Likely due to severe underlying morbid obesity with BMI of 70. CT of the chest inconclusive to rule out PE, lower extremity venous duplex negative, VQ scan unremarkable, echogram shows chronic diastolic CHF. Negative serial troponins. Likely shortness of breath obesity-related. Post discharge pulmonary followup.    3. Hypothyroidism. Continue home dose Synthroid , stable baseline TSH of 1.3.    4. Dyslipidemia continue home dose statin.    5. Essential hypertension -  pressure soft hold HCTZ, Lopressor and Norvasc.    6. OSA. CPAP and in bed. Patient encouraged to increase activity and sit in the recliner during the daytime.      7. ARF. Likely due to sepsis and transient hypotension in the day of admission, she also received IV contrast to rule out PE. Continue to hydrate her, avoid nephrotoxins, renal ultrasound stable, renal function much better and close to baseline now. Renal input appreciated.     Code Status: Full  Family Communication: None present  Disposition Plan: To be decided   Procedures CT angiogram chest inconclusive for PE, VQ scan, echogram, venous duplex right lower extremity, ABI right lower extremity  TTE  - Left ventricle: The cavity size was dilated. Wall thickness was increased. Systolic function was probably normal. The estimated ejection fraction was = 50%. - Left atrium: The atrium was dilated. - Right ventricle: Systolic function was reduced. - Right atrium: The atrium was dilated. - Pericardium, extracardiac: A small pericardial effusion was identified.   Renal ultrasound  Study is limited by body habitus which particularly limits evaluation of the left kidney, with left kidney seen and only limited detail. Study is otherwise normal.    Consults  orthopedics Dr. Sharol Given, renal, ID Dr. Linus Salmons on phone   Medications  Scheduled Meds: . amLODipine  5 mg Oral Daily  . ampicillin-sulbactam (UNASYN) IV  3 g Intravenous Q6H  . antiseptic oral  rinse  7 mL Mouth Rinse BID  . atorvastatin  10 mg Oral q1800  . Chlorhexidine Gluconate Cloth  6 each Topical Q0600  . enoxaparin (LOVENOX) injection  100 mg Subcutaneous Q24H  . guaiFENesin  600 mg Oral BID  . levothyroxine  150 mcg Oral QAC breakfast  . metoprolol  50 mg Oral BID  . mupirocin ointment  1 application Nasal BID  . sodium chloride  3 mL Intravenous Q12H   Continuous Infusions:   PRN Meds:.acetaminophen, acetaminophen, morphine injection, ondansetron  (ZOFRAN) IV, oxyCODONE  DVT Prophylaxis  Lovenox   Lab Results  Component Value Date   PLT 135* 07/27/2014    Antibiotics     Anti-infectives   Start     Dose/Rate Route Frequency Ordered Stop   07/25/14 1200  Ampicillin-Sulbactam (UNASYN) 3 g in sodium chloride 0.9 % 100 mL IVPB     3 g 100 mL/hr over 60 Minutes Intravenous Every 6 hours 07/25/14 1056     07/24/14 1800  vancomycin (VANCOCIN) 1,500 mg in sodium chloride 0.9 % 500 mL IVPB  Status:  Discontinued     1,500 mg 250 mL/hr over 120 Minutes Intravenous Every 12 hours 07/23/14 2247 07/25/14 1034   07/23/14 2300  vancomycin (VANCOCIN) IVPB 1000 mg/200 mL premix     1,000 mg 200 mL/hr over 60 Minutes Intravenous  Once 07/23/14 2247 07/24/14 0103   07/23/14 2300  piperacillin-tazobactam (ZOSYN) IVPB 3.375 g  Status:  Discontinued     3.375 g 12.5 mL/hr over 240 Minutes Intravenous 3 times per day 07/23/14 2247 07/25/14 1056   07/23/14 2115  piperacillin-tazobactam (ZOSYN) IVPB 3.375 g     3.375 g 12.5 mL/hr over 240 Minutes Intravenous  Once 07/23/14 2106 07/24/14 0130   07/23/14 1700  vancomycin (VANCOCIN) IVPB 1000 mg/200 mL premix  Status:  Discontinued     1,000 mg 200 mL/hr over 60 Minutes Intravenous  Once 07/23/14 1656 07/23/14 1657   07/23/14 1700  vancomycin (VANCOCIN) IVPB 1000 mg/200 mL premix    Comments:  DO NOT START UNTIL BLOOD CULTURES HAVE BEEN DRAWN.   1,000 mg 200 mL/hr over 60 Minutes Intravenous  Once 07/23/14 1657 07/23/14 1917          Objective:   Filed Vitals:   07/26/14 1545 07/26/14 2118 07/26/14 2247 07/27/14 0541  BP: 114/54 124/66  132/65  Pulse: 55 60 68 70  Temp: 98 F (36.7 C) 98.2 F (36.8 C)  98.2 F (36.8 C)  TempSrc: Oral Oral  Oral  Resp: 11 12 22 14   Height: 5\' 2"  (1.575 m)     Weight:    212.5 kg (468 lb 7.6 oz)  SpO2: 97% 96%  97%    Wt Readings from Last 3 Encounters:  07/27/14 212.5 kg (468 lb 7.6 oz)  08/14/13 182.709 kg (402 lb 12.8 oz)  08/02/13 181.348 kg  (399 lb 12.8 oz)     Intake/Output Summary (Last 24 hours) at 07/27/14 1019 Last data filed at 07/27/14 0508  Gross per 24 hour  Intake    240 ml  Output   1000 ml  Net   -760 ml     Physical Exam  Awake Alert, Oriented X 3, No new F.N deficits, Normal affect [place].AT,PERRAL Supple Neck,No JVD, No cervical lymphadenopathy appriciated.  Symmetrical Chest wall movement, Good air movement bilaterally, CTAB RRR,No Gallops,Rubs or new Murmurs, No Parasternal Heave +ve B.Sounds, Abd Soft, No tenderness, No organomegaly appriciated, No rebound - guarding or  rigidity. No Cyanosis, Clubbing or edema, No new Rash or bruise , right leg has frank cellulitis below the knee, minimal fluctuance behind the left calf   Data Review   Micro Results Recent Results (from the past 240 hour(s))  CULTURE, BLOOD (ROUTINE X 2)     Status: None   Collection Time    07/23/14  5:05 PM      Result Value Ref Range Status   Specimen Description BLOOD ARM LEFT   Final   Special Requests BOTTLES DRAWN AEROBIC AND ANAEROBIC 10CC   Final   Culture  Setup Time     Final   Value: 07/23/2014 20:37     Performed at [facility]   Culture     Final   Value: STREPTOCOCCUS GROUP G     Note: Gram Stain Report Called to,Read Back By and Verified With: JOANNA L@8 :50AM ON 07/24/14 BY DANTS     Performed at [facility]   Report Status 07/26/2014 FINAL   Final   Organism ID, Bacteria STREPTOCOCCUS GROUP G   Final  CULTURE, BLOOD (ROUTINE X 2)     Status: None   Collection Time    07/23/14  5:15 PM      Result Value Ref Range Status   Specimen Description BLOOD ARM RIGHT   Final   Special Requests BOTTLES DRAWN AEROBIC AND ANAEROBIC 10CC   Final   Culture  Setup Time     Final   Value: 07/23/2014 20:38     Performed at [facility]   Culture     Final   Value:        BLOOD CULTURE RECEIVED NO GROWTH TO DATE CULTURE WILL BE HELD FOR 5 DAYS BEFORE ISSUING A FINAL NEGATIVE REPORT     Performed  at [facility]   Report Status PENDING   Incomplete  MRSA PCR SCREENING     Status: Abnormal   Collection Time    07/24/14 12:07 AM      Result Value Ref Range Status   MRSA by PCR POSITIVE (*) NEGATIVE Final   Comment:            The GeneXpert MRSA Assay (FDA     approved for NASAL specimens     only), is one component of a     comprehensive MRSA colonization     surveillance program. It is not     intended to diagnose MRSA     infection nor to guide or     monitor treatment for     MRSA infections.     RESULT CALLED TO, READ BACK BY AND VERIFIED WITH:     Ellison Hughs RN [id number] AT 0232 SKEEN,P  URINE CULTURE     Status: None   Collection Time    07/25/14 10:40 AM      Result Value Ref Range Status   Specimen Description URINE, RANDOM   Final   Special Requests NONE   Final   Culture  Setup Time     Final   Value: 07/25/2014 17:30     Performed at [facility] Count     Final   Value: NO GROWTH     Performed at [facility]   Culture     Final   Value: NO GROWTH     Performed at [facility]   Report Status 07/26/2014 FINAL   Final    Radiology Reports No results  found.  CBC  Recent Labs Lab 07/23/14 1554 07/25/14 0340 07/26/14 0328 07/27/14 0625  WBC 9.9 10.6* 8.1 6.0  HGB 14.8 12.7 11.3* 11.3*  HCT 46.4* 41.7 37.0 37.5  PLT 190 146* 154 135*  MCV 89.7 94.3 92.7 94.0  MCH 28.6 28.7 28.3 28.3  MCHC 31.9 30.5 30.5 30.1  RDW 16.2* 16.6* 16.1* 16.0*  LYMPHSABS 0.4*  --   --   --   MONOABS 0.4  --   --   --   EOSABS 0.0  --   --   --   BASOSABS 0.0  --   --   --     Chemistries   Recent Labs Lab 07/23/14 1845 07/25/14 0340 07/26/14 0328 07/27/14 0625  NA 140 136* 137 137  K 4.1 4.3 3.9 4.5  CL 97 97 98 96  CO2 28 26 28 29   GLUCOSE 132* 112* 140* 115*  BUN 19 30* 22 19  CREATININE 1.12* 1.70* 1.30* 1.19*  CALCIUM 9.4 8.3* 8.3* 9.1  AST 17  --   --   --   ALT 14  --   --   --   ALKPHOS 58  --   --   --    BILITOT 0.9  --   --   --    ------------------------------------------------------------------------------------------------------------------ estimated creatinine clearance is 121 ml/min (by C-G formula based on Cr of 1.19). ------------------------------------------------------------------------------------------------------------------ No results found for this basename: HGBA1C,  in the last 72 hours ------------------------------------------------------------------------------------------------------------------ No results found for this basename: CHOL, HDL, LDLCALC, TRIG, CHOLHDL, LDLDIRECT,  in the last 72 hours ------------------------------------------------------------------------------------------------------------------ No results found for this basename: TSH, T4TOTAL, FREET3, T3FREE, THYROIDAB,  in the last 72 hours ------------------------------------------------------------------------------------------------------------------ No results found for this basename: VITAMINB12, FOLATE, FERRITIN, TIBC, IRON, RETICCTPCT,  in the last 72 hours  Coagulation profile No results found for this basename: INR, PROTIME,  in the last 168 hours  No results found for this basename: DDIMER,  in the last 72 hours  Cardiac Enzymes  Recent Labs Lab 07/23/14 2230 07/24/14 0348 07/24/14 0843  TROPONINI <0.30 <0.30 <0.30   ------------------------------------------------------------------------------------------------------------------ No components found with this basename: POCBNP,      Time Spent in minutes   35   Martine Bleecker K M.D on 07/27/2014 at 10:19 AM  Between 7am to 7pm - Pager - [phone]  After 7pm go to [email or web address] - password [email or web address]  And look for the night coverage person covering for me after hours  [facility] Office  [phone]   **Disclaimer: This note may have been dictated with voice recognition software. Similar sounding words can  inadvertently be transcribed and this note may contain transcription errors which may not have been corrected upon publication of note.**

## 2014-07-27 NOTE — Progress Notes (Signed)
Pt will place herself on cpap. If she needs any assistance, she knows to call. RT will continue to monitor.

## 2014-07-27 NOTE — Plan of Care (Signed)
Problem: Phase I Progression Outcomes Goal: Other Phase I Outcomes/Goals Outcome: Completed/Met Date Met:  07/27/14 CHF book given - Living better with heart failure

## 2014-07-28 LAB — BASIC METABOLIC PANEL
Anion gap: 13 (ref 5–15)
BUN: 14 mg/dL (ref 6–23)
CALCIUM: 8.9 mg/dL (ref 8.4–10.5)
CO2: 29 meq/L (ref 19–32)
Chloride: 99 mEq/L (ref 96–112)
Creatinine, Ser: 0.9 mg/dL (ref 0.50–1.10)
GFR calc Af Amer: >90 mL/min (ref >90)
GFR, EST NON AFRICAN AMERICAN: 82 mL/min — AB (ref >90)
GLUCOSE: 132 mg/dL — AB (ref 70–99)
Potassium: 3.8 mEq/L (ref 3.7–5.3)
Sodium: 141 mEq/L (ref 137–147)

## 2014-07-28 LAB — CBC
HEMATOCRIT: 36.5 % (ref 36.0–46.0)
HEMOGLOBIN: 11.5 g/dL — AB (ref 12.0–15.0)
MCH: 28 pg (ref 26.0–34.0)
MCHC: 31.5 g/dL (ref 30.0–36.0)
MCV: 88.8 fL (ref 78.0–100.0)
Platelets: 184 10*3/uL (ref 150–400)
RBC: 4.11 MIL/uL (ref 3.87–5.11)
RDW: 15.8 % — ABNORMAL HIGH (ref 11.5–15.5)
WBC: 6.9 10*3/uL (ref 4.0–10.5)

## 2014-07-28 MED ORDER — OXYCODONE HCL 5 MG PO TABS: 5.0000 mg | ORAL_TABLET | Freq: Four times a day (QID) | ORAL | Status: DC | PRN

## 2014-07-28 MED ORDER — AMOXICILLIN-POT CLAVULANATE 875-125 MG PO TABS: 1.0000 | ORAL_TABLET | Freq: Two times a day (BID) | ORAL | Status: DC

## 2014-07-28 NOTE — Consult Note (Addendum)
WOC wound consult note Reason for Consult: Consult requested by Dr Sharol Given to apply bilat Profore compression wraps today.  Refer to previous ortho progress notes on 8/28 for assessment and plan of care.  Una boots were applied Friday to reduce bilat leg edema.   Wound type: No open wound or drainage.  Bilat legs with generalized edema.  Left leg larger in size than right, with erythremia extending across posterior calf, very painful to touch.  Right leg previously marked for erythemia but this has resolved. Dressing procedure/placement/frequency: Removed previous Una boots and applied 4 layer Profore to BLE as requested.  Recommend follow-up with Dr Sharol Given as an outpatient to determine further plan of care regarding when the Profore wraps can be discontinued and pt can resume wearing compression stockings which she has at home and states they were custom-fitted.  Pt is reluctant to wear Profore long-term and will need home health assistance for weekly application if this treatment plan is desired. Please re-consult if further assistance is needed.  Thank-you,  Julien Girt MSN, [place], [place], [place], [place]

## 2014-07-28 NOTE — Discharge Instructions (Signed)
Follow with Primary MD Gwendolyn Grant, MD in 3 days , the right leg elevated, keep the compressive dressing clean and dry.  Get CBC, CMP, 2 view Chest X ray checked  by Primary MD next visit.    Activity: As tolerated with Full fall precautions use walker/cane & assistance as needed   Disposition Home     Diet: Heart Healthy    For Heart failure patients - Check your Weight same time everyday, if you gain over 2 pounds, or you develop in leg swelling, experience more shortness of breath or chest pain, call your Primary MD immediately. Follow Cardiac Low Salt Diet and 1.8 lit/day fluid restriction.   On your next visit with her primary care physician please Get Medicines reviewed and adjusted.  Please request your Prim.MD to go over all Hospital Tests and Procedure/Radiological results at the follow up, please get all Hospital records sent to your Prim MD by signing hospital release before you go home.   If you experience worsening of your admission symptoms, develop shortness of breath, life threatening emergency, suicidal or homicidal thoughts you must seek medical attention immediately by calling 911 or calling your MD immediately  if symptoms less severe.  You Must read complete instructions/literature along with all the possible adverse reactions/side effects for all the Medicines you take and that have been prescribed to you. Take any new Medicines after you have completely understood and accpet all the possible adverse reactions/side effects.   Do not drive, operating heavy machinery, perform activities at heights, swimming or participation in water activities or provide baby sitting services if your were admitted for syncope or siezures until you have seen by Primary MD or a Neurologist and advised to do so again.  Do not drive when taking Pain medications.    Do not take more than prescribed Pain, Sleep and Anxiety Medications  Special Instructions: If you have smoked or  chewed Tobacco  in the last 2 yrs please stop smoking, stop any regular Alcohol  and or any Recreational drug use.  Wear Seat belts while driving.   Please note  You were cared for by a hospitalist during your hospital stay. If you have any questions about your discharge medications or the care you received while you were in the hospital after you are discharged, you can call the unit and asked to speak with the hospitalist on call if the hospitalist that took care of you is not available. Once you are discharged, your primary care physician will handle any further medical issues. Please note that NO REFILLS for any discharge medications will be authorized once you are discharged, as it is imperative that you return to your primary care physician (or establish a relationship with a primary care physician if you do not have one) for your aftercare needs so that they can reassess your need for medications and monitor your lab values.

## 2014-07-28 NOTE — Progress Notes (Signed)
NURSING PROGRESS NOTE  Alexandra Beck [id number] Discharge Data: 07/28/2014 12:16 PM Attending Provider: Thurnell Lose, MD [id number] Asa Lente, MD     Duwayne Heck to be D/C'd Home with home health per MD order.  Discussed with the patient the After Visit Summary and all questions fully answered. All IV's discontinued with no bleeding noted. All belongings returned to patient for patient to take home.   Last Vital Signs:  Blood pressure 150/71, pulse 86, temperature 98.8 F (37.1 C), temperature source Oral, resp. rate 18, height 5\' 2"  (1.575 m), weight 212.3 kg (468 lb 0.6 oz), last menstrual period 07/09/2014, SpO2 98.00%.  Discharge Medication List   Medication List         amLODipine 5 MG tablet  Commonly known as:  NORVASC  Take 5 mg by mouth daily.     amoxicillin-clavulanate 875-125 MG per tablet  Commonly known as:  AUGMENTIN  Take 1 tablet by mouth 2 (two) times daily.     hydrochlorothiazide 25 MG tablet  Commonly known as:  HYDRODIURIL  Take 25 mg by mouth daily.     levothyroxine 150 MCG tablet  Commonly known as:  SYNTHROID, LEVOTHROID  Take 150 mcg by mouth daily before breakfast.     metoprolol 50 MG tablet  Commonly known as:  LOPRESSOR  Take 50 mg by mouth 2 (two) times daily.     oxyCODONE 5 MG immediate release tablet  Commonly known as:  Oxy IR/ROXICODONE  Take 1 tablet (5 mg total) by mouth every 6 (six) hours as needed for moderate pain.     rosuvastatin 10 MG tablet  Commonly known as:  CRESTOR  Take 10 mg by mouth daily.         Wallie Renshaw, RN

## 2014-07-28 NOTE — Discharge Summary (Signed)
Alexandra Beck, is a 33 y.o. female  DOB [birth year]  MRN [id number].  Admission date:  07/23/2014  Admitting Physician  Berle Mull, MD  Discharge Date:  07/28/2014   Primary MD  Gwendolyn Grant, MD  Recommendations for primary care physician for things to follow:   Monitor bilateral lower extremity cellulitis left more than right. Needs outpatient orthopedics followup on a close basis.   Admission Diagnosis  SOB (shortness of breath) [786.05] Hypoxic [799.02] Cellulitis of left lower extremity [682.6] Fever, unspecified fever cause [780.60]   Discharge Diagnosis  SOB (shortness of breath) [786.05] Hypoxic [799.02] Cellulitis of left lower extremity [682.6] Fever, unspecified fever cause [780.60]    Principal Problem:   Sepsis Active Problems:   HYPERTENSION   Morbid obesity with BMI of 70 and over, adult   OSA (obstructive sleep apnea)   CHF (congestive heart failure)   Diabetes mellitus type 2 in obese   Hypothyroidism   GERD (gastroesophageal reflux disease)   Venous insufficiency of leg   Hypoxia      Past Medical History  Diagnosis Date  . Morbid obesity   . Hyperlipidemia   . Diabetes mellitus   . Chronic kidney disease   . GERD (gastroesophageal reflux disease)   . Sepsis 11/2012    Secondary to cellulitis  . Hypothyroidism   . Hypertension   . Anemia   . Recurrent cellulitis of lower leg     LLE, venous insuff  . OSA (obstructive sleep apnea)   . Obesity hypoventilation syndrome   . Morbid obesity with BMI of 70 and over, adult   . CARDIOMYOPATHY, DILATED   . Depression   . Venous insufficiency of leg     Past Surgical History  Procedure Laterality Date  . Wisdom tooth extraction         History of present illness and  Hospital Course:     Kindly see H&P for history of present illness  and admission details, please review complete Labs, Consult reports and Test reports for all details in brief  HPI  from the history and physical done on the day of admission   Alexandra Beck is a 33 y.o. female with Past medical history of morbid obesity, sleep apnea on CPAP, diabetes mellitus, GERD, chronic kidney disease, recurrent cellulitis, hypothyroidism, hypertension, venous insufficiency.  The patient presented with complaints of left leg pain and swelling. She mentions that she was at her baseline yesterday night when she went to sleep and when she woke up this morning she had some pain on the back of her left knee. The pain was dull ache. She also had some pain in her left hip while she was walking. As she was feeling tired and more lethargic than her baseline she checked her temperature twice and the second time she found that her temperature was elevated. Later on she started noticing some redness and tingling on the left leg and noticed that it has been progressively worsening. She started having worsening fever and cold clammy  hands and therefore came to the hospital.  She has been having progressively worsening shortness of breath over last few month and denies any recent worsening in her symptoms. She complains of chest tightness when she is walking around at her baseline. She mentions the distance has been more shorter to reproduce her symptoms over that time. She starts having some wheezing as well when she is walking. She denies any cough. She denies any chest pain, orthopnea or PND at the time of my evaluation.   She complains of bilateral leg swelling with left leg worsened than right at this point. She mentions she has on and off leg swelling over last few months.   She mentions is compliant with all her medication and there is no recent change in her medication.  The patient is coming from home. And at her baseline independent for most of her ADL.    Hospital Course   1.  Sepsis due to cellulitis in the right lower extremity. Cultures noted, one out of 2 sets growing Streptococcus group G., discussed antibiotic options with ID over the phone,  once improved okay to switch to oral antibiotic per ID for total of 14 days. Since she responded well to Unasyn will be switched to Augmentin. Right lower extremity venous duplex negative, there is nonspecific soft tissue fluid action on ultrasound and seen by orthopedic surgeon Dr. Sharol Given who recommends UNA boots which have been placed, clinically better. We'll discharge with outpatient orthopedics followup with Dr. Sharol Given along with home RN and PT.   2. Acute on chronic shortness of breath. Worsening exercise capacity. Likely due to severe underlying morbid obesity with BMI of 70. CT of the chest inconclusive to rule out PE, lower extremity venous duplex negative, VQ scan unremarkable, echogram shows chronic diastolic CHF. Negative serial troponins. Likely shortness of breath obesity-related. Post discharge pulmonary followup.    3. Hypothyroidism. Continue home dose Synthroid , stable baseline TSH of 1.     6. OSA. CPAP and in bed. Patient encouraged to increase activity and sit in the recliner during the daytime.     7. ARF. Likely due to sepsis and transient hypotension in the day of admission, completely resolved after IV fluids, was seen by renal this admission.          Discharge Condition: stable   Follow UP  Follow-up Information   Follow up with DUDA,MARCUS V, MD In 1 week.   Specialty:  Orthopedic Surgery   Contact information:   [place] [address] [phone]       Follow up with Gwendolyn Grant, MD. Schedule an appointment as soon as possible for a visit in 3 days.   Specialty:  Internal Medicine   Contact information:   [address] [place] [address] [phone]       Follow up with [facility], MD. Schedule an appointment as  soon as possible for a visit in 1 week.   Specialty:  Pulmonary Disease   Contact information:   [place] [address] [phone]         Discharge Instructions  and  Discharge Medications          Discharge Instructions   Diet - low sodium heart healthy    Complete by:  As directed      Discharge instructions    Complete by:  As directed   Follow with Primary MD Gwendolyn Grant, MD in  3 days , the right leg elevated, keep the compressive dressing clean and dry.  Get CBC, CMP, 2 view Chest X ray checked  by Primary MD next visit.    Activity: As tolerated with Full fall precautions use walker/cane & assistance as needed   Disposition Home     Diet: Heart Healthy    For Heart failure patients - Check your Weight same time everyday, if you gain over 2 pounds, or you develop in leg swelling, experience more shortness of breath or chest pain, call your Primary MD immediately. Follow Cardiac Low Salt Diet and 1.8 lit/day fluid restriction.   On your next visit with her primary care physician please Get Medicines reviewed and adjusted.  Please request your Prim.MD to go over all Hospital Tests and Procedure/Radiological results at the follow up, please get all Hospital records sent to your Prim MD by signing hospital release before you go home.   If you experience worsening of your admission symptoms, develop shortness of breath, life threatening emergency, suicidal or homicidal thoughts you must seek medical attention immediately by calling 911 or calling your MD immediately  if symptoms less severe.  You Must read complete instructions/literature along with all the possible adverse reactions/side effects for all the Medicines you take and that have been prescribed to you. Take any new Medicines after you have completely understood and accpet all the possible adverse reactions/side effects.   Do not drive, operating heavy machinery, perform activities at  heights, swimming or participation in water activities or provide baby sitting services if your were admitted for syncope or siezures until you have seen by Primary MD or a Neurologist and advised to do so again.  Do not drive when taking Pain medications.    Do not take more than prescribed Pain, Sleep and Anxiety Medications  Special Instructions: If you have smoked or chewed Tobacco  in the last 2 yrs please stop smoking, stop any regular Alcohol  and or any Recreational drug use.  Wear Seat belts while driving.   Please note  You were cared for by a hospitalist during your hospital stay. If you have any questions about your discharge medications or the care you received while you were in the hospital after you are discharged, you can call the unit and asked to speak with the hospitalist on call if the hospitalist that took care of you is not available. Once you are discharged, your primary care physician will handle any further medical issues. Please note that NO REFILLS for any discharge medications will be authorized once you are discharged, as it is imperative that you return to your primary care physician (or establish a relationship with a primary care physician if you do not have one) for your aftercare needs so that they can reassess your need for medications and monitor your lab values.     Increase activity slowly    Complete by:  As directed             Medication List         amLODipine 5 MG tablet  Commonly known as:  NORVASC  Take 5 mg by mouth daily.     amoxicillin-clavulanate 875-125 MG per tablet  Commonly known as:  AUGMENTIN  Take 1 tablet by mouth 2 (two) times daily.     hydrochlorothiazide 25 MG tablet  Commonly known as:  HYDRODIURIL  Take 25 mg by mouth daily.     levothyroxine 150 MCG tablet  Commonly known  as:  SYNTHROID, LEVOTHROID  Take 150 mcg by mouth daily before breakfast.     metoprolol 50 MG tablet  Commonly known as:  LOPRESSOR  Take  50 mg by mouth 2 (two) times daily.     oxyCODONE 5 MG immediate release tablet  Commonly known as:  Oxy IR/ROXICODONE  Take 1 tablet (5 mg total) by mouth every 6 (six) hours as needed for moderate pain.     rosuvastatin 10 MG tablet  Commonly known as:  CRESTOR  Take 10 mg by mouth daily.          Diet and Activity recommendation: See Discharge Instructions above   Consults obtained - Duda   Major procedures and Radiology Reports - PLEASE review detailed and final reports for all details, in brief -       Dg Chest 2 View  07/23/2014   CLINICAL DATA:  Fever and shortness of breath.  Left leg cellulitis.  EXAM: CHEST  2 VIEW  COMPARISON:  PA and lateral chest 09/16/2011.  FINDINGS: The lungs are clear. Heart size is enlarged. No pneumothorax or pleural effusion. No focal bony abnormality.  IMPRESSION: Cardiomegaly without acute disease.   Electronically Signed   By: Inge Rise M.D.   On: 07/23/2014 18:12   Ct Angio Chest Pe W/cm &/or Wo Cm  07/23/2014   CLINICAL DATA:  Hypoxia with cough and shortness of breath.  EXAM: CT ANGIOGRAPHY CHEST WITH CONTRAST  TECHNIQUE: Multidetector CT imaging of the chest was performed using the standard protocol during bolus administration of intravenous contrast. Multiplanar CT image reconstructions and MIPs were obtained to evaluate the vascular anatomy.  CONTRAST:  70 cc Omnipaque 350  COMPARISON:  None.  FINDINGS: Markedly degraded study secondary to body habitus and patient inability to reproducibly breath hold. There is no evidence for thrombus in the main pulmonary outflow tract. Given the motion artifact and image noise, the study is nondiagnostic for detection of pulmonary embolus in the main pulmonary arteries and more distal branches.  No thoracic aortic aneurysm. The heart is enlarged. No pericardial or pleural effusion. No evidence for mediastinal lymphadenopathy.  Lung volumes are low and fine detail is distorted by breathing motion.  No dense airspace consolidation. Atelectasis is seen in the lower lungs bilaterally.  Bone windows reveal no worrisome lytic or sclerotic osseous lesions.  Review of the MIP images confirms the above findings.  IMPRESSION: Nondiagnostic exam for acute pulmonary embolus secondary to body habitus and inability to breath hold.  I personally discussed this study by telephone with the PA caring for the patient, Alexandra Beck, at 2047 hrs on 07/23/2014.   Electronically Signed   By: Misty Stanley M.D.   On: 07/23/2014 20:47   US Renal  07/25/2014   CLINICAL DATA:  Acute renal failure  EXAM: RENAL/URINARY TRACT ULTRASOUND COMPLETE  COMPARISON:  12/25/2012  FINDINGS: Right Kidney:  Length: 11.3. Echogenicity within normal limits. No mass or hydronephrosis visualized.  Left Kidney:  Length: 10.3. Echogenicity within normal limits. No mass or hydronephrosis visualized. Significantly limited evaluation of the left kidney due to body habitus.  Bladder:  Appears normal for degree of bladder distention.  IMPRESSION: Study is limited by body habitus which particularly limits evaluation of the left kidney, with left kidney seen and only limited detail. Study is otherwise normal.   Electronically Signed   By: Skipper Cliche M.D.   On: 07/25/2014 09:36   Nm Pulmonary Perf And Vent  07/24/2014   CLINICAL DATA:  Shortness of breath.  EXAM: NUCLEAR MEDICINE VENTILATION - PERFUSION LUNG SCAN  TECHNIQUE: Ventilation images were obtained in multiple projections using inhaled aerosol technetium 99 M DTPA. Perfusion images were obtained in multiple projections after intravenous injection of Tc-79m MAA.  RADIOPHARMACEUTICALS:  6 mCi Tc-2m DTPA aerosol and 40 mCi Tc-4m MAA  COMPARISON:  CT chest 07/23/2014.  FINDINGS: Ventilation: This study is limited due to the patient's size. Not all views could be obtained. No defect is identified.  Perfusion: Not all views could be obtained. No defect is identified.  IMPRESSION: Limited study  demonstrating no evidence pulmonary embolus.   Electronically Signed   By: Inge Rise M.D.   On: 07/24/2014 14:13   Dg Tibia/fibula Left Port  07/24/2014   CLINICAL DATA:  Left lower leg pain, redness and swelling.  EXAM: PORTABLE LEFT TIBIA AND FIBULA - 2 VIEW  COMPARISON:  CT dated 12/25/2012.  FINDINGS: Diffuse soft tissue swelling. Unremarkable bones. No soft tissue gas.  IMPRESSION: Diffuse soft tissue swelling without underlying bony abnormality.   Electronically Signed   By: Enrique Sack M.D.   On: 07/24/2014 01:05    Micro Results      Recent Results (from the past 240 hour(s))  CULTURE, BLOOD (ROUTINE X 2)     Status: None   Collection Time    07/23/14  5:05 PM      Result Value Ref Range Status   Specimen Description BLOOD ARM LEFT   Final   Special Requests BOTTLES DRAWN AEROBIC AND ANAEROBIC 10CC   Final   Culture  Setup Time     Final   Value: 07/23/2014 20:37     Performed at [facility]   Culture     Final   Value: STREPTOCOCCUS GROUP G     Note: Gram Stain Report Called to,Read Back By and Verified With: JOANNA L@8 :50AM ON 07/24/14 BY DANTS     Performed at [facility]   Report Status 07/26/2014 FINAL   Final   Organism ID, Bacteria STREPTOCOCCUS GROUP G   Final  CULTURE, BLOOD (ROUTINE X 2)     Status: None   Collection Time    07/23/14  5:15 PM      Result Value Ref Range Status   Specimen Description BLOOD ARM RIGHT   Final   Special Requests BOTTLES DRAWN AEROBIC AND ANAEROBIC 10CC   Final   Culture  Setup Time     Final   Value: 07/23/2014 20:38     Performed at [facility]   Culture     Final   Value:        BLOOD CULTURE RECEIVED NO GROWTH TO DATE CULTURE WILL BE HELD FOR 5 DAYS BEFORE ISSUING A FINAL NEGATIVE REPORT     Performed at [facility]   Report Status PENDING   Incomplete  MRSA PCR SCREENING     Status: Abnormal   Collection Time    07/24/14 12:07 AM      Result Value Ref Range Status   MRSA by PCR  POSITIVE (*) NEGATIVE Final   Comment:            The GeneXpert MRSA Assay (FDA     approved for NASAL specimens     only), is one component of a     comprehensive MRSA colonization     surveillance program. It is not     intended to diagnose MRSA     infection nor to  guide or     monitor treatment for     MRSA infections.     RESULT CALLED TO, READ BACK BY AND VERIFIED WITH:     Ellison Hughs RN [id number] AT 0232 SKEEN,P  URINE CULTURE     Status: None   Collection Time    07/25/14 10:40 AM      Result Value Ref Range Status   Specimen Description URINE, RANDOM   Final   Special Requests NONE   Final   Culture  Setup Time     Final   Value: 07/25/2014 17:30     Performed at [facility] Count     Final   Value: NO GROWTH     Performed at [facility]   Culture     Final   Value: NO GROWTH     Performed at [facility]   Report Status 07/26/2014 FINAL   Final       Today   Subjective:   Alexandra Beck today has no headache,no chest abdominal pain,no new weakness tingling or numbness, feels much better wants to go home today.   Objective:   Blood pressure 150/71, pulse 86, temperature 98.8 F (37.1 C), temperature source Oral, resp. rate 18, height 5\' 2"  (1.575 m), weight 212.3 kg (468 lb 0.6 oz), last menstrual period 07/09/2014, SpO2 98.00%.   Intake/Output Summary (Last 24 hours) at 07/28/14 1051 Last data filed at 07/28/14 0700  Gross per 24 hour  Intake    880 ml  Output    701 ml  Net    179 ml    Exam Awake Alert, Oriented x 3, No new F.N deficits, Normal affect [place].AT,PERRAL Supple Neck,No JVD, No cervical lymphadenopathy appriciated.  Symmetrical Chest wall movement, Good air movement bilaterally, CTAB RRR,No Gallops,Rubs or new Murmurs, No Parasternal Heave +ve B.Sounds, Abd Soft, Non tender, No organomegaly appriciated, No rebound -guarding or rigidity. No Cyanosis, Clubbing or edema, No new Rash or bruise Bilateral lower  extremities left more than right 2-3+ edema, cellulitis left more than right. Both legs have UNA boots  Data Review   CBC w Diff: Lab Results  Component Value Date   WBC 6.9 07/28/2014   WBC 9.8 12/23/2012   HGB 11.5* 07/28/2014   HGB 11.8* 12/23/2012   HCT 36.5 07/28/2014   HCT 33.7* 12/23/2012   PLT 184 07/28/2014   LYMPHOPCT 5* 07/23/2014   MONOPCT 4 07/23/2014   EOSPCT 0 07/23/2014   BASOPCT 0 07/23/2014    CMP: Lab Results  Component Value Date   NA 141 07/28/2014   K 3.8 07/28/2014   CL 99 07/28/2014   CO2 29 07/28/2014   BUN 14 07/28/2014   CREATININE 0.90 07/28/2014   PROT 6.9 07/23/2014   ALBUMIN 3.4* 07/23/2014   BILITOT 0.9 07/23/2014   ALKPHOS 58 07/23/2014   AST 17 07/23/2014   ALT 14 07/23/2014  .   Total Time in preparing paper work, data evaluation and todays exam - 35 minutes  Thurnell Lose M.D on 07/28/2014 at 10:51 AM  [facility] Office  [phone]   **Disclaimer: This note may have been dictated with voice recognition software. Similar sounding words can inadvertently be transcribed and this note may contain transcription errors which may not have been corrected upon publication of note.**

## 2014-07-28 NOTE — Care Management Note (Signed)
    Page 1 of 1   07/28/2014     3:29:06 PM CARE MANAGEMENT NOTE 07/28/2014  Patient:  Alexandra Beck, Alexandra Beck   Account Number:  [id number]  Date Initiated:  07/24/2014  Documentation initiated by:  Marvetta Gibbons  Subjective/Objective Assessment:   Pt admitted with sepsis/cellulitis     Action/Plan:   PTA pt lived at home with partner   Anticipated DC Date:  07/28/2014   Anticipated DC Plan:  [place]  CM consult      [place]   Choice offered to / List presented to:  C-1 Patient        [place] arranged  HH-1 RN      [place].   Status of service:  Completed, signed off Medicare Important Message given?  NO (If response is "NO", the following Medicare IM given date fields will be blank) Date Medicare IM given:   Medicare IM given by:   Date Additional Medicare IM given:   Additional Medicare IM given by:    Discharge Disposition:  [place]  Per UR Regulation:  Reviewed for med. necessity/level of care/duration of stay  If discussed at [place] of Stay Meetings, dates discussed:    Comments:  07/28/14 [place], BSN [phone] patient would like to work with [facility] for [facility] to wrap her legs,  referral made to [facility], [place] notified.  Patient will have a follow up apt with Dr. Sharol Given on next Thursday. [facility] is aware.  Patient wanted to speak with Dr. Sharol Given, NCM relayed this to Staff RN.  Patient states she does not want hhpt, but will need HHRN.

## 2014-07-28 NOTE — Progress Notes (Signed)
ANTIBIOTIC CONSULT NOTE - FOLLOW UP  Pharmacy Consult for Unasyn Indication: Sepsis due to cellulitis  Allergies  Allergen Reactions  . Aspirin     REACTION: throat swelling, hives  . Doxycycline Other (See Comments)    Abdominal pain  . Niaspan [Niacin Er]     Caused flushing    Assessment: 33 year old female on Day 6 of antibiotic therapy for sepsis / cellultis. Currenly on Unasyn 3 gm IV Q 6 hours. Per ID, pt is to receive 14 days of abx and may convert to oral upon symptom improvement. Scr has trended down to 0.9. Pt is afebrile. CrCl > 100 mL/min.   Vancomycin 8/26>>8/28 Zosyn 8/26>>8/28 Unasyn 8/28>>  BCx2 8/26 >> Strep Grp G 1/2 (S-pen) MRSA pcr+ (mupirocin) 8/28 Urine Cx >> neg    Goal of Therapy:  Appropriate Unasyn dosing  Plan:  1) Continue Unasyn 3 grams iv Q 6 hours. F/u transition to oral therapy.  2) Continue to follow   Albertina Parr, PharmD.  Clinical Pharmacist Pager [phone]

## 2014-07-29 ENCOUNTER — Encounter: Payer: Self-pay | Admitting: Internal Medicine

## 2014-07-29 ENCOUNTER — Ambulatory Visit (INDEPENDENT_AMBULATORY_CARE_PROVIDER_SITE_OTHER): Payer: BC Managed Care – PPO | Admitting: Internal Medicine

## 2014-07-29 VITALS — BP 140/82 | HR 71 | Temp 98.8°F | Wt >= 6400 oz

## 2014-07-29 DIAGNOSIS — I10 Essential (primary) hypertension: Secondary | ICD-10-CM

## 2014-07-29 DIAGNOSIS — I872 Venous insufficiency (chronic) (peripheral): Secondary | ICD-10-CM

## 2014-07-29 DIAGNOSIS — L02419 Cutaneous abscess of limb, unspecified: Secondary | ICD-10-CM

## 2014-07-29 DIAGNOSIS — L03119 Cellulitis of unspecified part of limb: Secondary | ICD-10-CM

## 2014-07-29 LAB — CULTURE, BLOOD (ROUTINE X 2): CULTURE: NO GROWTH

## 2014-07-29 MED ORDER — ROSUVASTATIN CALCIUM 10 MG PO TABS: 10.0000 mg | ORAL_TABLET | Freq: Every day | ORAL | Status: DC

## 2014-07-29 MED ORDER — LEVOTHYROXINE SODIUM 150 MCG PO TABS: 150.0000 ug | ORAL_TABLET | Freq: Every day | ORAL | Status: DC

## 2014-07-29 MED ORDER — AMLODIPINE BESYLATE 5 MG PO TABS: 5.0000 mg | ORAL_TABLET | Freq: Every day | ORAL | Status: DC

## 2014-07-29 MED ORDER — METOPROLOL TARTRATE 50 MG PO TABS: 50.0000 mg | ORAL_TABLET | Freq: Two times a day (BID) | ORAL | Status: DC

## 2014-07-29 MED ORDER — HYDROCHLOROTHIAZIDE 25 MG PO TABS: 25.0000 mg | ORAL_TABLET | Freq: Every day | ORAL | Status: DC

## 2014-07-29 NOTE — Patient Instructions (Signed)
Please finish your antibiotic as you are  Please continue all other medications as before, and refills have been done if requested.  Please have the pharmacy call with any other refills you may need.  Please continue your efforts at being more active, low cholesterol diet, and weight control.  You are otherwise up to date with prevention measures today.  Please keep your appointments with your specialists as you may have planned - orthopedic, and home health nurse  You can consider asking Dr Sharol Given if [facility] would be appropriate for you

## 2014-07-29 NOTE — Progress Notes (Signed)
Pre visit review using our clinic review tool, if applicable. No additional management support is needed unless otherwise documented below in the visit note. 

## 2014-07-29 NOTE — Progress Notes (Signed)
   Subjective:    Patient ID: Alexandra Beck, female    DOB: 05/20/1981, 33 y.o.   MRN: [id number]  HPI  Here to fu recent leg cellulitis now improved red/tender/swelling though some discomfort persists, despite antibx and wraps.  Pt denies chest pain, wheezing, orthopnea, PND, increased LE swelling, palpitations, dizziness or syncope, but does have persistent swelling, needs hct refill.  Does have mild ongoing sob/doe, declines proair trial, has pulm appt sept 8,  Pt denies fever, wt loss, night sweats, loss of appetite, or other constitutional symptoms  Pt denies new neurological symptoms such as new headache, or facial or extremity weakness or numbness   Pt denies polydipsia, polyuria, Past Medical History  Diagnosis Date  . Morbid obesity   . Hyperlipidemia   . Diabetes mellitus   . Chronic kidney disease   . GERD (gastroesophageal reflux disease)   . Sepsis 11/2012    Secondary to cellulitis  . Hypothyroidism   . Hypertension   . Anemia   . Recurrent cellulitis of lower leg     LLE, venous insuff  . OSA (obstructive sleep apnea)   . Obesity hypoventilation syndrome   . Morbid obesity with BMI of 70 and over, adult   . CARDIOMYOPATHY, DILATED   . Depression   . Venous insufficiency of leg    Past Surgical History  Procedure Laterality Date  . Wisdom tooth extraction      reports that she quit smoking about 3 years ago. Her smoking use included Cigarettes. She smoked 0.00 packs per day for 10 years. She has never used smokeless tobacco. She reports that she does not drink alcohol or use illicit drugs. family history includes Heart disease in her father; Hypertension in her father and mother. Allergies  Allergen Reactions  . Aspirin     REACTION: throat swelling, hives  . Doxycycline Other (See Comments)    Abdominal pain  . Niaspan [Niacin Er]     Caused flushing   No current outpatient prescriptions on file prior to visit.   No current facility-administered medications on  file prior to visit.    Review of Systems  Constitutional: Negative for unusual diaphoresis or other sweats  HENT: Negative for ringing in ear Eyes: Negative for double vision or worsening visual disturbance.  Respiratory: Negative for choking and stridor.   Gastrointestinal: Negative for vomiting or other signifcant bowel change Genitourinary: Negative for hematuria or decreased urine volume.  Musculoskeletal: Negative for other MSK pain or swelling Skin: Negative for color change and worsening wound.  Neurological: Negative for tremors and numbness other than noted  Psychiatric/Behavioral: Negative for decreased concentration or agitation other than above       Objective:   Physical Exam BP 140/82  Pulse 71  Temp(Src) 98.8 F (37.1 C) (Oral)  Wt 468 lb (212.283 kg)  SpO2 93%  LMP 07/09/2014 VS noted,  Constitutional: Pt appears well-developed, well-nourished.  HENT: Head: NCAT.  Right Ear: External ear normal.  Left Ear: External ear normal.  Eyes: . Pupils are equal, round, and reactive to light. Conjunctivae and EOM are normal Neck: Normal range of motion. Neck supple.  Cardiovascular: Normal rate and regular rhythm.   Pulmonary/Chest: Effort normal and breath sounds decreased, no rales or wheezing.  Neurological: Pt is alert. Not confused , motor grossly intact Bilat LE's with persistent edema/wraps bilat Psychiatric: Pt behavior is normal. No agitation.     Assessment & Plan:

## 2014-08-01 ENCOUNTER — Telehealth: Payer: Self-pay | Admitting: Internal Medicine

## 2014-08-01 NOTE — Telephone Encounter (Signed)
Pt cancelled the next 2 visits with home health nurse stating her doctor is the only one that is suppose to change her dressing.

## 2014-08-01 NOTE — Telephone Encounter (Signed)
[facility] informed of MD instructions.

## 2014-08-01 NOTE — Telephone Encounter (Signed)
Ok for pt to f/u with Dr Mitzie Na as she has planned

## 2014-08-03 NOTE — Assessment & Plan Note (Signed)
stable overall by history and exam, recent data reviewed with pt, and pt to continue medical treatment as before,  to f/u any worsening symptoms or concerns, for re-start hct BP Readings from Last 3 Encounters:  07/29/14 140/82  07/28/14 150/71  08/14/13 130/86

## 2014-08-03 NOTE — Assessment & Plan Note (Signed)
To finish antibx as prescribed

## 2014-08-03 NOTE — Assessment & Plan Note (Signed)
Cont wraps until healed, work on wt loss, leg elevation, low salt diet

## 2014-08-05 ENCOUNTER — Encounter: Payer: Self-pay | Admitting: Internal Medicine

## 2014-08-05 ENCOUNTER — Ambulatory Visit (INDEPENDENT_AMBULATORY_CARE_PROVIDER_SITE_OTHER): Payer: BC Managed Care – PPO | Admitting: Internal Medicine

## 2014-08-05 VITALS — BP 116/78 | HR 63 | Temp 98.6°F | Ht 62.0 in | Wt >= 6400 oz

## 2014-08-05 DIAGNOSIS — E662 Morbid (severe) obesity with alveolar hypoventilation: Secondary | ICD-10-CM

## 2014-08-05 DIAGNOSIS — R0600 Dyspnea, unspecified: Secondary | ICD-10-CM | POA: Insufficient documentation

## 2014-08-05 DIAGNOSIS — R0609 Other forms of dyspnea: Secondary | ICD-10-CM

## 2014-08-05 DIAGNOSIS — R0989 Other specified symptoms and signs involving the circulatory and respiratory systems: Secondary | ICD-10-CM

## 2014-08-05 NOTE — Patient Instructions (Signed)
Weight control is simply a matter of calorie balance which needs to be tilted in your favor by eating less and exercising more.  To get the most out of exercise, you need to be continuously aware that you are short of breath, but never out of breath, for 30 minutes daily. As you improve, it will actually be easier for you to do the same amount of exercise  in  30 minutes so always push to the level where you are short of breath.  If this does not result in gradual weight reduction then I strongly recommend you see a nutritionist with a food diary x 2 weeks so that we can work out a negative calorie balance which is universally effective in steady weight loss programs.  Think of your calorie balance like you do your bank account where in this case you want the balance to go down so you must take in less calories than you burn up.  It's just that simple:  Hard to do, but easy to understand.  Good luck!   Elevate head of bed as much as possible   Follow up with Dr Gwenette Greet in next 4-6 weeks

## 2014-08-05 NOTE — Progress Notes (Addendum)
Subjective:    Patient ID: Alexandra Beck, female    DOB: Sep 03, 1981  MRN: [id number]  HPI  59 yowf quit smoking 2011 at wt around 260 with progressive wt gain then doe onset summer of 2015 with no airflow obst by spirometry 08/05/14 when seen first in pulmonary clinic (already established in sleep with Dr Gwenette Greet)  p  referred by [facility] to admit with sepsis:   Admission date: 07/23/2014   Discharge Date: 07/28/2014  Primary MD Gwendolyn Grant, MD  Recommendations for primary care physician for things to follow:  Monitor bilateral lower extremity cellulitis left more than right. Needs outpatient orthopedics followup on a close basis.  Admission Diagnosis SOB (shortness of breath) [786.05]  Hypoxic [799.02]  Cellulitis of left lower extremity [682.6]  Fever, unspecified fever cause [780.60]  Discharge Diagnosis SOB (shortness of breath) [786.05]  Hypoxic [799.02]  Cellulitis of left lower extremity [682.6]  Fever, unspecified fever cause [780.60]  Principal Problem:  Sepsis  Active Problems:  HYPERTENSION  Morbid obesity with BMI of 70 and over, adult  OSA (obstructive sleep apnea)  CHF (congestive heart failure)  Diabetes mellitus type 2 in obese  Hypothyroidism  GERD (gastroesophageal reflux disease)  Venous insufficiency of leg  Hypoxia  Past Medical History   Diagnosis  Date   .  Morbid obesity    .  Hyperlipidemia    .  Diabetes mellitus    .  Chronic kidney disease    .  GERD (gastroesophageal reflux disease)    .  Sepsis  11/2012     Secondary to cellulitis   .  Hypothyroidism    .  Hypertension    .  Anemia    .  Recurrent cellulitis of lower leg      LLE, venous insuff   .  OSA (obstructive sleep apnea)    .  Obesity hypoventilation syndrome    .  Morbid obesity with BMI of 70 and over, adult    .  CARDIOMYOPATHY, DILATED    .  Depression    .  Venous insufficiency of leg     Past Surgical History   Procedure  Laterality  Date   .  Wisdom tooth  extraction     History of present illness and Hospital Course:  Kindly see H&P for history of present illness and admission details, please review complete Labs, Consult reports and Test reports for all details in brief  HPI from the history and physical done on the day of admission  Alexandra Beck is a 33 y.o. female with Past medical history of morbid obesity, sleep apnea on CPAP, diabetes mellitus, GERD, chronic kidney disease, recurrent cellulitis, hypothyroidism, hypertension, venous insufficiency.  The patient presented with complaints of left leg pain and swelling. She mentions that she was at her baseline yesterday night when she went to sleep and when she woke up this morning she had some pain on the back of her left knee. The pain was dull ache. She also had some pain in her left hip while she was walking. As she was feeling tired and more lethargic than her baseline she checked her temperature twice and the second time she found that her temperature was elevated. Later on she started noticing some redness and tingling on the left leg and noticed that it has been progressively worsening. She started having worsening fever and cold clammy hands and therefore came to the hospital.  She has been having progressively worsening shortness  of breath over last few month and denies any recent worsening in her symptoms. She complains of chest tightness when she is walking around at her baseline. She mentions the distance has been more shorter to reproduce her symptoms over that time. She starts having some wheezing as well when she is walking. She denies any cough. She denies any chest pain, orthopnea or PND at the time of my evaluation.  She complains of bilateral leg swelling with left leg worsened than right at this point. She mentions she has on and off leg swelling over last few months.  She mentions is compliant with all her medication and there is no recent change in her medication.  The patient is  coming from home. And at her baseline independent for most of her ADL.  Hospital Course  1. Sepsis due to cellulitis in the right lower extremity. Cultures noted, one out of 2 sets growing Streptococcus group G., discussed antibiotic options with ID over the phone, once improved okay to switch to oral antibiotic per ID for total of 14 days. Since she responded well to Unasyn will be switched to Augmentin. Right lower extremity venous duplex negative, there is nonspecific soft tissue fluid action on ultrasound and seen by orthopedic surgeon Dr. Sharol Given who recommends UNA boots which have been placed, clinically better. We'll discharge with outpatient orthopedics followup with Dr. Sharol Given along with home RN and PT.  2. Acute on chronic shortness of breath. Worsening exercise capacity. Likely due to severe underlying morbid obesity with BMI of 70. CT of the chest inconclusive to rule out PE, lower extremity venous duplex negative, VQ scan unremarkable, echogram shows chronic diastolic CHF. Negative serial troponins. Likely shortness of breath obesity-related. Post discharge pulmonary followup.  3. Hypothyroidism. Continue home dose Synthroid , stable baseline TSH of 1.  6. OSA. CPAP and in bed. Patient encouraged to increase activity and sit in the recliner during the daytime.  7. ARF. Likely due to sepsis and transient hypotension in the day of admission, completely resolved after IV fluids, was seen by renal this admission.     08/05/2014 1st [facility] office visit/ Alexandra Beck  Chief Complaint  Patient presents with  . Pulmonary Consult    Pt c/o DOE with walking "a couple feet" for the past 3 months. She had recent hospitalization for Hypoxia and dyspnea.   doe x across the parking lot but better x one week s inhalers  Baseline parking lot to [employer] s stopping at wt 25 lb less than now 3 m prior to [place] for osa / sleeps on  onepillow and cpap machine   No obvious  day to day or daytime  variabilty or assoc chronic cough or cp or chest tightness, subjective wheeze overt sinus or hb symptoms. No unusual exp hx or h/o childhood pna/ asthma or knowledge of premature birth.  Sleeping ok without nocturnal  or early am exacerbation  of respiratory  c/o's or need for noct saba. Also denies any obvious fluctuation of symptoms with weather or environmental changes or other aggravating or alleviating factors except as outlined above   Current Medications, Allergies, Complete Past Medical History, Past Surgical History, Family History, and Social History were reviewed in [employer] record.            Review of Systems  Constitutional: Negative for fever, chills and unexpected weight change.  HENT: Negative for congestion, dental problem, ear pain, nosebleeds, postnasal drip, rhinorrhea, sinus pressure,  sneezing, sore throat, trouble swallowing and voice change.   Eyes: Negative for visual disturbance.  Respiratory: Positive for shortness of breath. Negative for cough and choking.   Cardiovascular: Positive for leg swelling. Negative for chest pain.  Gastrointestinal: Negative for vomiting, abdominal pain and diarrhea.  Genitourinary: Negative for difficulty urinating.  Musculoskeletal: Negative for arthralgias.  Skin: Negative for rash.  Neurological: Negative for tremors, syncope and headaches.  Hematological: Does not bruise/bleed easily.       Objective:   Physical Exam   amb massively obese wf nad can't get up on exam table  Wt Readings from Last 3 Encounters:  08/05/14 432 lb (195.954 kg)  07/29/14 468 lb (212.283 kg)  07/28/14 468 lb 0.6 oz (212.3 kg)     HEENT: nl dentition, turbinates, and orophanx. Nl external ear canals without cough reflex   NECK :  without JVD/Nodes/TM/ nl carotid upstrokes bilaterally   LUNGS: no acc muscle use, clear to A and P bilaterally without cough on insp or exp maneuvers   CV:  RRR  no s3 or murmur or  increase in P2, trace edema on R, L lower ext bandaged  ABD:  soft and nontender with nl excursion in the supine position. No bruits or organomegaly, bowel sounds nl  MS:  warm without deformities, calf tenderness, cyanosis or clubbing  SKIN: warm and dry without lesions    NEURO:  alert, approp, no deficits    Lab Results  Component Value Date   TSH 1.390 07/24/2014       Chemistry      Component Value Date/Time   NA 141 07/28/2014 0704   K 3.8 07/28/2014 0704   CL 99 07/28/2014 0704   CO2 29 07/28/2014 0704   BUN 14 07/28/2014 0704   CREATININE 0.90 07/28/2014 0704      Component Value Date/Time   CALCIUM 8.9 07/28/2014 0704   CALCIUM 9.6 06/26/2007 0000   ALKPHOS 58 07/23/2014 1845   AST 17 07/23/2014 1845   ALT 14 07/23/2014 1845   BILITOT 0.9 07/23/2014 1845          Assessment & Plan:

## 2014-08-06 ENCOUNTER — Telehealth: Payer: Self-pay | Admitting: Internal Medicine

## 2014-08-06 NOTE — Assessment & Plan Note (Signed)
See dyspnea, referred back to Dr Gwenette Greet re ? bipap needed

## 2014-08-06 NOTE — Telephone Encounter (Signed)
Needs clarification on orders sent.  The orders [facility] has is for them to come out to change unna boot weekly.  Patient states Dr. Asa Lente wanted to do this for the first two weeks.

## 2014-08-06 NOTE — Assessment & Plan Note (Signed)
Spirometry 08/05/14  Mod restrictive changes s obst   Extended discussion re:  Effects of obesity and absence of airflow obstruction  She is mildly retaining CO2 and likely needs more than cpap to help rest / unload diaphragm while sleeping so referred back to Dr Gwenette Greet  In meantime reviewed calorie balance issues in detail   See instructions for specific recommendations which were reviewed directly with the patient who was given a copy with highlighter outlining the key components.

## 2014-08-07 NOTE — Telephone Encounter (Signed)
Please advise [facility] to change as per their routine recommendations - i think weekly is fine

## 2014-08-08 NOTE — Telephone Encounter (Signed)
lmtcb

## 2014-08-13 ENCOUNTER — Telehealth: Payer: Self-pay | Admitting: *Deleted

## 2014-08-13 NOTE — Telephone Encounter (Signed)
Left msg on tri[facility] stating pt was d/c from hosp with wound care services. Not been able to get in contact with patient will be d/c her service...Alexandra Beck

## 2014-08-21 ENCOUNTER — Other Ambulatory Visit: Payer: Self-pay | Admitting: Internal Medicine

## 2014-09-02 ENCOUNTER — Ambulatory Visit: Payer: BC Managed Care – PPO | Admitting: Internal Medicine

## 2014-09-10 ENCOUNTER — Ambulatory Visit (INDEPENDENT_AMBULATORY_CARE_PROVIDER_SITE_OTHER): Payer: BC Managed Care – PPO | Admitting: Pulmonary Disease

## 2014-09-10 ENCOUNTER — Encounter: Payer: Self-pay | Admitting: Pulmonary Disease

## 2014-09-10 VITALS — BP 124/70 | HR 58 | Temp 99.0°F | Ht 62.0 in | Wt >= 6400 oz

## 2014-09-10 DIAGNOSIS — G4733 Obstructive sleep apnea (adult) (pediatric): Secondary | ICD-10-CM

## 2014-09-10 NOTE — Progress Notes (Signed)
   Subjective:    Patient ID: Alexandra Beck, female    DOB: 08/21/1981, 33 y.o.   MRN: [id number]  HPI The patient comes in today for followup of her known severe obstructive sleep apnea. She has received a new CPAP device, and is being treated on the automatic setting. She has good compliance on her download, but she has significant mask leaks which are leading to breakthrough events. The patient has worked with her home care company on mask fitting without success. She feels that her daytime alertness is much improved, but continues to be an issue. Unfortunately, she has gained 39 pounds since the last visit.   Review of Systems  Constitutional: Negative for fever and unexpected weight change.  HENT: Negative for congestion, dental problem, ear pain, nosebleeds, postnasal drip, rhinorrhea, sinus pressure, sneezing, sore throat and trouble swallowing.   Eyes: Negative for redness and itching.  Respiratory: Negative for cough, chest tightness, shortness of breath and wheezing.   Cardiovascular: Negative for palpitations and leg swelling.  Gastrointestinal: Negative for nausea and vomiting.  Genitourinary: Negative for dysuria.  Musculoskeletal: Negative for joint swelling.  Skin: Negative for rash.  Neurological: Negative for headaches.  Hematological: Does not bruise/bleed easily.  Psychiatric/Behavioral: Negative for dysphoric mood. The patient is not nervous/anxious.        Objective:   Physical Exam Morbidly obese female in no acute distress Nose without discharge or purulence noted Neck without lymphadenopathy or thyromegaly No skin breakdown or pressure necrosis from the CPAP mask Lower extremities with significant edema, no cyanosis Alert and oriented, moves all 4 extremities.       Assessment & Plan:

## 2014-09-10 NOTE — Patient Instructions (Signed)
Will refer you to the sleep center during the day for a formal mask fitting. Keep working on weight loss Will see you back in 44mos to check on things.  Please call if issues arise in the interim.

## 2014-09-10 NOTE — Assessment & Plan Note (Signed)
The patient has been very compliant with her CPAP by her download, but continues to have breakthrough apnea secondary to significant mask leaking. She has worked with her home care company without success. We will therefore refer her to the sleep Center for a formal mask fitting. She has also gained 39 pounds since the last visit, and I have told her there is a limit to the pressure on all of the current positive pressure devices.  I have also discussed with her the possibility of a tracheostomy with nocturnal ventilation if we are unable to treat her adequately with a noninvasive positive pressure device.

## 2014-09-29 ENCOUNTER — Ambulatory Visit (HOSPITAL_BASED_OUTPATIENT_CLINIC_OR_DEPARTMENT_OTHER): Payer: BC Managed Care – PPO | Attending: Pulmonary Disease | Admitting: Radiology

## 2014-09-29 DIAGNOSIS — G4733 Obstructive sleep apnea (adult) (pediatric): Secondary | ICD-10-CM

## 2014-09-29 DIAGNOSIS — Z9989 Dependence on other enabling machines and devices: Principal | ICD-10-CM

## 2014-10-15 ENCOUNTER — Encounter: Payer: Self-pay | Admitting: Family

## 2014-10-15 ENCOUNTER — Ambulatory Visit (INDEPENDENT_AMBULATORY_CARE_PROVIDER_SITE_OTHER): Payer: BC Managed Care – PPO | Admitting: Family

## 2014-10-15 ENCOUNTER — Ambulatory Visit: Payer: BC Managed Care – PPO | Admitting: Internal Medicine

## 2014-10-15 VITALS — BP 138/82 | HR 73 | Temp 98.4°F | Resp 20 | Ht 62.0 in | Wt >= 6400 oz

## 2014-10-15 DIAGNOSIS — R059 Cough, unspecified: Secondary | ICD-10-CM

## 2014-10-15 DIAGNOSIS — R600 Localized edema: Secondary | ICD-10-CM

## 2014-10-15 DIAGNOSIS — R05 Cough: Secondary | ICD-10-CM

## 2014-10-15 DIAGNOSIS — I872 Venous insufficiency (chronic) (peripheral): Secondary | ICD-10-CM

## 2014-10-15 DIAGNOSIS — I10 Essential (primary) hypertension: Secondary | ICD-10-CM

## 2014-10-15 MED ORDER — METOPROLOL SUCCINATE ER 100 MG PO TB24: 100.0000 mg | ORAL_TABLET | Freq: Every day | ORAL | Status: DC

## 2014-10-15 MED ORDER — HYDROCHLOROTHIAZIDE 25 MG PO TABS: 50.0000 mg | ORAL_TABLET | Freq: Every day | ORAL | Status: DC

## 2014-10-15 NOTE — Patient Instructions (Addendum)
Thank you for choosing [facility].  Summary/Instructions:  Your prescription(s) have been submitted to your pharmacy. Please take as directed and contact our office if you believe you are having problem(s) with the medication(s).  Please stop by the lab on the basement level of the building for your blood work. Your results will be released to [facility] (or called to you) after review, usually within 72hours after test completion. If any changes need to be made, you will be notified at that same time.  Referrals have been made during this visit. You should expect to hear back from our schedulers in about 7-10 days in regards to establishing an appointment with the specialists we discussed.   If your symptoms worsen or fail to improve, please contact our office for further instruction, or in case of emergency go directly to the emergency room at the closest medical facility.   STOP taking Amlodipine (Norvasc) - Increase Hydrochlorothiazide to 50 mg daily. And start Metoprolol ER 100 mg daily. Follow in 2 weeks.   Recommend Delsym for the cough.

## 2014-10-15 NOTE — Assessment & Plan Note (Signed)
Continues to have increased edema in legs, noted to be worse after standing. Has been compliant with the stockings. Blood pressure remains slightly increased. Increase HCTZ to 50 mg daily and stop amlodipine. This will encourage diuresis. Pt would like to be referred to Vascular Surgery for potential ablation. Follow up in about 2 weeks for electrolyte check.

## 2014-10-15 NOTE — Assessment & Plan Note (Signed)
Not likely related to infectious process. Cannot rule out heart failure, although lungs are clear. Continue to use Delsym as needed. If not improved will consider course of PPI or inhaler to rule out other causes.

## 2014-10-15 NOTE — Progress Notes (Signed)
   Subjective:    Patient ID: Alexandra Beck, female    DOB: May 19, 1981, 33 y.o.   MRN: [id number]  Chief Complaint  Patient presents with  . Hospitalization Follow-up    had cellulitis in leg, they gave her stockings for swelling, swelling is still there    HPI:  Alexandra Beck is a 33 y.o. female who presents today follow up.   1) Swelling in legs - Wears compression stockings on a daily basis, however is still experiencing swelling. Following a hospitalization she was getting her legs wrapped by [employer] for a couple of weeks. After getting up in the morning there is less fluid, and at the end of the day there is more tightness. Was seen by [facility] V[facility] Surgery and suggested for possible ablation. This past hospitalization is the fourth time that she has had to be hospitalized for cellulitis of the lower extremities.   2) Cough - ever since a dentist appointment she has had a cough for about 2 weeks. Denies fevers, chills, stuffiness, or sinus pressure.  Has noticed some wheezing on occasion.   Allergies  Allergen Reactions  . Aspirin     REACTION: throat swelling, hives  . Doxycycline Other (See Comments)    Abdominal pain  . Lisinopril Cough  . Niaspan [Niacin Er]     Caused flushing   Current Outpatient Prescriptions on File Prior to Visit  Medication Sig Dispense Refill  . CRESTOR 10 MG tablet TAKE 1 TABLET BY MOUTH EVERY DAY 30 tablet 3  . levothyroxine (SYNTHROID, LEVOTHROID) 150 MCG tablet Take 1 tablet (150 mcg total) by mouth daily before breakfast. 90 tablet 0   No current facility-administered medications on file prior to visit.   Past Medical History  Diagnosis Date  . Morbid obesity   . Hyperlipidemia   . Diabetes mellitus   . Chronic kidney disease   . GERD (gastroesophageal reflux disease)   . Sepsis 11/2012    Secondary to cellulitis  . Hypothyroidism   . Hypertension   . Anemia   . Recurrent cellulitis of lower leg     LLE, venous insuff   . OSA (obstructive sleep apnea)   . Obesity hypoventilation syndrome   . Morbid obesity with BMI of 70 and over, adult   . CARDIOMYOPATHY, DILATED   . Depression   . Venous insufficiency of leg     Review of Systems    See HPI Objective:    BP 138/82 mmHg  Pulse 73  Temp(Src) 98.4 F (36.9 C) (Oral)  Resp 20  Ht 5\' 2"  (1.575 m)  Wt 442 lb 9.6 oz (200.762 kg)  BMI 80.93 kg/m2  SpO2 94% Nursing note and vital signs reviewed.  Physical Exam  Constitutional: She is oriented to person, place, and time. She appears well-developed and well-nourished. No distress.  Morbidly obese female, seated in a chair, dressed appropriately and appears her stated age.   Cardiovascular: Normal rate, regular rhythm, normal heart sounds and intact distal pulses.   Moderate amounts of edema present bilaterally. Pulses and sensation intact and appropriate.   Pulmonary/Chest: Effort normal and breath sounds normal.  Neurological: She is alert and oriented to person, place, and time.  Skin: Skin is warm and dry.  Psychiatric: She has a normal mood and affect. Her behavior is normal. Judgment and thought content normal.      Assessment & Plan:

## 2014-10-15 NOTE — Progress Notes (Signed)
Pre visit review using our clinic review tool, if applicable. No additional management support is needed unless otherwise documented below in the visit note. 

## 2014-10-29 ENCOUNTER — Encounter: Payer: Self-pay | Admitting: Family

## 2014-10-29 ENCOUNTER — Other Ambulatory Visit: Payer: Self-pay | Admitting: *Deleted

## 2014-10-29 ENCOUNTER — Ambulatory Visit (INDEPENDENT_AMBULATORY_CARE_PROVIDER_SITE_OTHER): Payer: BC Managed Care – PPO | Admitting: Family

## 2014-10-29 VITALS — BP 142/84 | HR 68 | Temp 98.8°F | Resp 18 | Ht 62.0 in | Wt >= 6400 oz

## 2014-10-29 DIAGNOSIS — I10 Essential (primary) hypertension: Secondary | ICD-10-CM

## 2014-10-29 DIAGNOSIS — I872 Venous insufficiency (chronic) (peripheral): Secondary | ICD-10-CM

## 2014-10-29 DIAGNOSIS — R059 Cough, unspecified: Secondary | ICD-10-CM

## 2014-10-29 DIAGNOSIS — R05 Cough: Secondary | ICD-10-CM

## 2014-10-29 DIAGNOSIS — R609 Edema, unspecified: Secondary | ICD-10-CM

## 2014-10-29 MED ORDER — OMEPRAZOLE 20 MG PO CPDR: 20.0000 mg | DELAYED_RELEASE_CAPSULE | Freq: Every day | ORAL | Status: DC

## 2014-10-29 NOTE — Assessment & Plan Note (Signed)
Cough remains present still, though improved. Potential cough related to GERD as patient indicates the cough and some vocal cord raspiness on occasion. Start omeprazole. Follow up if symptoms worsen or fail to improve.

## 2014-10-29 NOTE — Assessment & Plan Note (Signed)
Blood pressure remained stable. Continue hydrochlorothiazide and metoprolol at current levels. Follow up as needed.

## 2014-10-29 NOTE — Patient Instructions (Signed)
Thank you for choosing [facility].  Summary/Instructions:  Your prescription(s) have been submitted to your pharmacy. Please take as directed and contact our office if you believe you are having problem(s) with the medication(s).  If your symptoms worsen or fail to improve, please contact our office for further instruction, or in case of emergency go directly to the emergency room at the closest medical facility.   Continue current medications as prescribed. Start the omeprazole for the GERD.   Gastroesophageal Reflux Disease, Adult Gastroesophageal reflux disease (GERD) happens when acid from your stomach flows up into the esophagus. When acid comes in contact with the esophagus, the acid causes soreness (inflammation) in the esophagus. Over time, GERD may create small holes (ulcers) in the lining of the esophagus. CAUSES   Increased body weight. This puts pressure on the stomach, making acid rise from the stomach into the esophagus.  Smoking. This increases acid production in the stomach.  Drinking alcohol. This causes decreased pressure in the lower esophageal sphincter (valve or ring of muscle between the esophagus and stomach), allowing acid from the stomach into the esophagus.  Late evening meals and a full stomach. This increases pressure and acid production in the stomach.  A malformed lower esophageal sphincter. Sometimes, no cause is found. SYMPTOMS   Burning pain in the lower part of the mid-chest behind the breastbone and in the mid-stomach area. This may occur twice a week or more often.  Trouble swallowing.  Sore throat.  Dry cough.  Asthma-like symptoms including chest tightness, shortness of breath, or wheezing. DIAGNOSIS  Your caregiver may be able to diagnose GERD based on your symptoms. In some cases, X-rays and other tests may be done to check for complications or to check the condition of your stomach and esophagus. TREATMENT  Your caregiver may  recommend over-the-counter or prescription medicines to help decrease acid production. Ask your caregiver before starting or adding any new medicines.  HOME CARE INSTRUCTIONS   Change the factors that you can control. Ask your caregiver for guidance concerning weight loss, quitting smoking, and alcohol consumption.  Avoid foods and drinks that make your symptoms worse, such as:  Caffeine or alcoholic drinks.  Chocolate.  Peppermint or mint flavorings.  Garlic and onions.  Spicy foods.  Citrus fruits, such as oranges, lemons, or limes.  Tomato-based foods such as sauce, chili, salsa, and pizza.  Fried and fatty foods.  Avoid lying down for the 3 hours prior to your bedtime or prior to taking a nap.  Eat small, frequent meals instead of large meals.  Wear loose-fitting clothing. Do not wear anything tight around your waist that causes pressure on your stomach.  Raise the head of your bed 6 to 8 inches with wood blocks to help you sleep. Extra pillows will not help.  Only take over-the-counter or prescription medicines for pain, discomfort, or fever as directed by your caregiver.  Do not take aspirin, ibuprofen, or other nonsteroidal anti-inflammatory drugs (NSAIDs). SEEK IMMEDIATE MEDICAL CARE IF:   You have pain in your arms, neck, jaw, teeth, or back.  Your pain increases or changes in intensity or duration.  You develop nausea, vomiting, or sweating (diaphoresis).  You develop shortness of breath, or you faint.  Your vomit is green, yellow, black, or looks like coffee grounds or blood.  Your stool is red, bloody, or black. These symptoms could be signs of other problems, such as heart disease, gastric bleeding, or esophageal bleeding. MAKE SURE YOU:   Understand  these instructions.  Will watch your condition.  Will get help right away if you are not doing well or get worse. Document Released: 08/24/2005 Document Revised: 02/06/2012 Document Reviewed:  06/03/2011 [facility] Patient Information 2015 [place], [place]. This information is not intended to replace advice given to you by your health care provider. Make sure you discuss any questions you have with your health care provider.

## 2014-10-29 NOTE — Assessment & Plan Note (Signed)
Continue to experience lower leg edema. HCTZ has helped. Continue HCTZ at this time. Has appointment to follow up with vein and vascular surgery in January. Denies any shortness of breath chest pain or discomfort. Morbid obesity remains a factor in leg edema.

## 2014-10-29 NOTE — Progress Notes (Signed)
Pre visit review using our clinic review tool, if applicable. No additional management support is needed unless otherwise documented below in the visit note. 

## 2014-10-29 NOTE — Progress Notes (Signed)
   Subjective:    Patient ID: Alexandra Beck, female    DOB: 1981/07/12, 33 y.o.   MRN: [id number]  Chief Complaint  Patient presents with  . Follow-up    Says hasn't noticed any changes in her legs, still wearing support hose    HPI:  Alexandra Beck is a 33 y.o. female who presents today for leg swelling follow up.  1) Leg edema - Continues to wear support stockings and is taking the HCTZ. Not sure if the swelling has decreased or not.  2) Hypertension - maintained on metoprolol and HCTZ. Denies any chest pain/discomfort, shortness of breath, or palpitations. Does continue to have a cough.   BP Readings from Last 3 Encounters:  10/29/14 142/84  10/15/14 138/82  09/10/14 124/70   Allergies  Allergen Reactions  . Aspirin     REACTION: throat swelling, hives  . Doxycycline Other (See Comments)    Abdominal pain  . Lisinopril Cough  . Niaspan [Niacin Er]     Caused flushing   Current Outpatient Prescriptions on File Prior to Visit  Medication Sig Dispense Refill  . CRESTOR 10 MG tablet TAKE 1 TABLET BY MOUTH EVERY DAY 30 tablet 3  . hydrochlorothiazide (HYDRODIURIL) 25 MG tablet Take 2 tablets (50 mg total) by mouth daily. 180 tablet 0  . levothyroxine (SYNTHROID, LEVOTHROID) 150 MCG tablet Take 1 tablet (150 mcg total) by mouth daily before breakfast. 90 tablet 0  . metoprolol succinate (TOPROL-XL) 100 MG 24 hr tablet Take 1 tablet (100 mg total) by mouth daily. Take with or immediately following a meal. 30 tablet 2   No current facility-administered medications on file prior to visit.   Review of Systems    See HPI  Objective:    BP 142/84 mmHg  Pulse 68  Temp(Src) 98.8 F (37.1 C) (Oral)  Resp 18  Ht 5\' 2"  (1.575 m)  Wt 442 lb 12.8 oz (200.853 kg)  BMI 80.97 kg/m2  SpO2 95% Nursing note and vital signs reviewed.  Physical Exam  Constitutional: She is oriented to person, place, and time. She appears well-developed and well-nourished. No distress.  Obese female  seated in the chair. Dressed appropriately and appears her stated age.   Cardiovascular: Normal rate, regular rhythm, normal heart sounds and intact distal pulses.   Moderate amount of edema of lower extremities. Pulses and sensation are intact and appropriate. No evidence of infection.   Pulmonary/Chest: Effort normal and breath sounds normal.  Neurological: She is alert and oriented to person, place, and time.  Skin: Skin is warm and dry.  Psychiatric: She has a normal mood and affect. Her behavior is normal. Judgment and thought content normal.       Assessment & Plan:

## 2014-12-08 ENCOUNTER — Encounter: Payer: Self-pay | Admitting: Vascular Surgery

## 2014-12-09 ENCOUNTER — Ambulatory Visit (HOSPITAL_COMMUNITY)
Admission: RE | Admit: 2014-12-09 | Discharge: 2014-12-09 | Disposition: A | Discharge status: Home / Self Care | Payer: BC Managed Care – PPO | Source: Ambulatory Visit | Attending: Vascular Surgery | Admitting: Vascular Surgery

## 2014-12-09 ENCOUNTER — Encounter: Payer: Self-pay | Admitting: Vascular Surgery

## 2014-12-09 ENCOUNTER — Other Ambulatory Visit: Payer: Self-pay | Admitting: Vascular Surgery

## 2014-12-09 ENCOUNTER — Ambulatory Visit (INDEPENDENT_AMBULATORY_CARE_PROVIDER_SITE_OTHER): Payer: BC Managed Care – PPO | Admitting: Vascular Surgery

## 2014-12-09 VITALS — BP 143/85 | HR 69 | Resp 16 | Ht 62.0 in | Wt >= 6400 oz

## 2014-12-09 DIAGNOSIS — R600 Localized edema: Secondary | ICD-10-CM

## 2014-12-09 DIAGNOSIS — I83893 Varicose veins of bilateral lower extremities with other complications: Secondary | ICD-10-CM

## 2014-12-09 DIAGNOSIS — R609 Edema, unspecified: Secondary | ICD-10-CM | POA: Diagnosis not present

## 2014-12-09 NOTE — Progress Notes (Signed)
Subjective:     Patient ID: Alexandra Beck, female   DOB: 1980-12-06, 34 y.o.   MRN: [id number]  HPI this 34 year old female returns today for further evaluation of her bilateral edema with episodes of cellulitis left greater than right. She was seen by Dr. Trula Slade February 2014 in the hospital. She is morbidly obese and has apparently gained about 50 pounds since being evaluated by Dr. Trula Slade and currently weighs 450 pounds. Is no history of DVT. She has been treated with extrinsic compression wraps below the knee by Dr. Sharol Given in the past with improvement in her edema but once these are stopped the edema returns. Has no history of stasis ulcers bleeding or bulging varicosities. She has had worsening edema in both legs over the last few years. She cannot wear a long-leg elastic compression stockings because of the size of her legs. Her last episode of cellulitis was a few months ago.  Past Medical History  Diagnosis Date  . Morbid obesity   . Hyperlipidemia   . Diabetes mellitus   . Chronic kidney disease   . GERD (gastroesophageal reflux disease)   . Sepsis 11/2012    Secondary to cellulitis  . Hypothyroidism   . Hypertension   . Anemia   . Recurrent cellulitis of lower leg     LLE, venous insuff  . OSA (obstructive sleep apnea)   . Obesity hypoventilation syndrome   . Morbid obesity with BMI of 70 and over, adult   . CARDIOMYOPATHY, DILATED   . Depression   . Venous insufficiency of leg     History  Substance Use Topics  . Smoking status: Former Smoker -- 0.25 packs/day for 10 years    Types: Cigarettes    Quit date: 01/21/2011  . Smokeless tobacco: Never Used  . Alcohol Use: No    Family History  Problem Relation Age of Onset  . Hypertension Mother   . Hypertension Father   . Heart disease Father     before age 51    Allergies  Allergen Reactions  . Aspirin     REACTION: throat swelling, hives  . Doxycycline Other (See Comments)    Abdominal pain  . Lisinopril Cough   . Niaspan [Niacin Er]     Caused flushing    Current outpatient prescriptions: CRESTOR 10 MG tablet, TAKE 1 TABLET BY MOUTH EVERY DAY, Disp: 30 tablet, Rfl: 3;  hydrochlorothiazide (HYDRODIURIL) 25 MG tablet, Take 2 tablets (50 mg total) by mouth daily., Disp: 180 tablet, Rfl: 0;  levothyroxine (SYNTHROID, LEVOTHROID) 150 MCG tablet, Take 1 tablet (150 mcg total) by mouth daily before breakfast., Disp: 90 tablet, Rfl: 0 metoprolol succinate (TOPROL-XL) 100 MG 24 hr tablet, Take 1 tablet (100 mg total) by mouth daily. Take with or immediately following a meal., Disp: 30 tablet, Rfl: 2;  omeprazole (PRILOSEC) 20 MG capsule, Take 1 capsule (20 mg total) by mouth daily., Disp: 30 capsule, Rfl: 3  BP 143/85 mmHg  Pulse 69  Resp 16  Ht 5\' 2"  (1.575 m)  Wt 455 lb (206.387 kg)  BMI 83.20 kg/m2  Body mass index is 83.2 kg/(m^2).           Review of Systems denies chest pain    has dyspnea on exertion.Morbidly obese.  Objective:   Physical Exam BP 143/85 mmHg  Pulse 69  Resp 16  Ht 5\' 2"  (1.575 m)  Wt 455 lb (206.387 kg)  BMI 83.20 kg/m2  General morbidly obese female in no apparent  distress alert and oriented 3 Lungs no rhonchi or wheezing Abdomen morbidly obese Bilateral lower extremities with 1-2+ edema below the knees. No bulging varicosities noted. No stasis ulcers or bleeding. Early hyperpigmentation lower third both legs.  Today I ordered a venous duplex exam the left leg which I reviewed and interpreted and also reviewed the results of the right leg which was done in the past. Both of these reveal gross reflux throughout the great saphenous veins in the size of the left great saphenous vein has enlarged significantly since the previous study done in 2014. There is some deep venous reflux as well.     Assessment:     #1 bilateral swelling and pain with recurrent episodes of cellulitis due to edema #2 morbid obesity #3 gross reflux bilateral great saphenous veins  contributing to #1     Plan:     Discuss this at length with patient and the fact that it will be technically difficult to perform ablation but do believe that an attempt at bilateral great saphenous vein ablation would be in her best interest to see if this will decrease edema and decrease episodes of cellulitis. She understands that this may not be successful technically and that she will continue to have some edema particularly if the degree of obesity continues. She will consider this and determine whether she would like to proceed with precertification.

## 2015-01-10 ENCOUNTER — Other Ambulatory Visit: Payer: Self-pay | Admitting: Family

## 2015-01-19 ENCOUNTER — Other Ambulatory Visit: Payer: Self-pay

## 2015-01-19 DIAGNOSIS — R600 Localized edema: Secondary | ICD-10-CM

## 2015-01-19 DIAGNOSIS — I10 Essential (primary) hypertension: Secondary | ICD-10-CM

## 2015-01-19 MED ORDER — HYDROCHLOROTHIAZIDE 25 MG PO TABS: 50.0000 mg | ORAL_TABLET | Freq: Every day | ORAL | Status: DC

## 2015-01-21 ENCOUNTER — Other Ambulatory Visit: Payer: Self-pay | Admitting: Family

## 2015-02-27 ENCOUNTER — Telehealth: Payer: Self-pay | Admitting: Family

## 2015-02-27 MED ORDER — OMEPRAZOLE 20 MG PO CPDR: 20.0000 mg | DELAYED_RELEASE_CAPSULE | Freq: Every day | ORAL | Status: DC

## 2015-02-27 NOTE — Telephone Encounter (Signed)
Pharmacy is needing an approval for a prescription for omprazole. I don't see this medication on her list and she says that Terri Piedra is the one who prescribed it to her. Her pharmacy is [facility] on [address].

## 2015-02-27 NOTE — Telephone Encounter (Signed)
Refill sent to pharmacy...Alexandra Beck

## 2015-03-02 ENCOUNTER — Telehealth: Payer: Self-pay

## 2015-03-02 NOTE — Telephone Encounter (Signed)
PA started via cover my meds.  

## 2015-03-09 ENCOUNTER — Ambulatory Visit: Payer: BC Managed Care – PPO | Admitting: Pulmonary Disease

## 2015-03-17 ENCOUNTER — Telehealth: Payer: Self-pay | Admitting: Internal Medicine

## 2015-03-17 NOTE — Telephone Encounter (Signed)
LVM for pt to call back.

## 2015-03-17 NOTE — Telephone Encounter (Signed)
Patient called regarding omeprazole (PRILOSEC) 20 MG capsule [[id number]. Insurance doesn't want to pay for this unless the provider states she needs to take this medication. Pharmacy is [place] is wondering if this is same OTC Prilosec.

## 2015-04-18 ENCOUNTER — Other Ambulatory Visit: Payer: Self-pay | Admitting: Family

## 2015-04-18 ENCOUNTER — Other Ambulatory Visit: Payer: Self-pay | Admitting: Internal Medicine

## 2015-04-23 ENCOUNTER — Other Ambulatory Visit: Payer: Self-pay | Admitting: Internal Medicine

## 2015-05-19 ENCOUNTER — Telehealth: Payer: Self-pay

## 2015-05-19 NOTE — Telephone Encounter (Signed)
Pt will send her recent HgA1c to her PCP office

## 2015-05-25 ENCOUNTER — Other Ambulatory Visit: Payer: Self-pay

## 2015-06-08 ENCOUNTER — Ambulatory Visit: Payer: BC Managed Care – PPO | Admitting: Family

## 2015-06-10 ENCOUNTER — Ambulatory Visit (INDEPENDENT_AMBULATORY_CARE_PROVIDER_SITE_OTHER): Payer: BC Managed Care – PPO | Admitting: Family

## 2015-06-10 ENCOUNTER — Encounter: Payer: Self-pay | Admitting: Family

## 2015-06-10 VITALS — BP 132/82 | HR 67 | Temp 98.0°F | Resp 20 | Ht 64.0 in | Wt >= 6400 oz

## 2015-06-10 DIAGNOSIS — R600 Localized edema: Secondary | ICD-10-CM | POA: Insufficient documentation

## 2015-06-10 DIAGNOSIS — Z6841 Body Mass Index (BMI) 40.0 and over, adult: Secondary | ICD-10-CM | POA: Diagnosis not present

## 2015-06-10 MED ORDER — LORCASERIN HCL 10 MG PO TABS: 10.0000 mg | ORAL_TABLET | Freq: Two times a day (BID) | ORAL | Status: DC

## 2015-06-10 NOTE — Assessment & Plan Note (Signed)
Left lower extremity edema most likely related to venous insufficiency, however cannot rule out DVT. Obtain lower extremity venous Doppler. Continue with compression stockings at this time, decrease sodium in diet, and elevate legs as much as possible. Will call back to the office regarding size for her compression stocking. Currently maintained on diuretic, however most likely will not have an effect on her edema. She has been seen by vascular surgery and ablation is not possible at this time secondary to weight and potential complications. Follow up pending LE venous dopplers.

## 2015-06-10 NOTE — Patient Instructions (Signed)
Thank you for choosing [facility].  Summary/Instructions:  Referrals have been made during this visit. You should expect to hear back from our schedulers in about 7-10 days in regards to establishing an appointment with the specialists we discussed.   If your symptoms worsen or fail to improve, please contact our office for further instruction, or in case of emergency go directly to the emergency room at the closest medical facility.

## 2015-06-10 NOTE — Progress Notes (Signed)
Pre visit review using our clinic review tool, if applicable. No additional management support is needed unless otherwise documented below in the visit note. 

## 2015-06-10 NOTE — Progress Notes (Signed)
Subjective:    Patient ID: Alexandra Beck, female    DOB: November 19, 1981, 34 y.o.   MRN: [id number]  Chief Complaint  Patient presents with  . Leg Swelling    Concerned about the swelling in her left leg, has had issues of cellulitis in the past, wants to talk about weight loss medication    HPI:  Alexandra Beck is a 34 y.o. female with a PMH of hypertension, heart failure, venous insufficiency, obstructive sleep apnea, GERD, type 2 diabetes, hypothyroidism, and morbid obesity who presents today for an office follow-up.  1.) Leg swelling - Associated symptom of edema located in her left lower extremity has been going on for about 1 week. Notes that she has had previous cellulitis but she usually gets a fever which she has not had. Has a significant history of venous insufficiency. Modifying factors include compression hose which does help a little. 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 Describes the leg as swelling and occasional pain which comes and go. Redness is localized to the anterior medical aspect  2.) Weight loss - Associated symptom of morbid obesity has been going on for years and was potentially interested in started Belviq. Currently she tries to exercise, but notes that it feels impossible. Has difficulty walking around the grocery store she gets short of breath. She is also interested in obtaining information about bariatric surgery.    Allergies  Allergen Reactions  . Aspirin     REACTION: throat swelling, hives  . Doxycycline Other (See Comments)    Abdominal pain  . Lisinopril Cough  . Niaspan [Niacin Er]     Caused flushing    Current Outpatient Prescriptions on File Prior to Visit  Medication Sig Dispense Refill  . hydrochlorothiazide (HYDRODIURIL) 25 MG tablet TAKE 2 TABLETS BY MOUTH EVERY DAY 180 tablet 0  . levothyroxine (SYNTHROID, LEVOTHROID) 150 MCG tablet TAKE 1 TABLET BY MOUTH EVERY MORNING BEFORE BREAKFAST 90 tablet 0  . metoprolol succinate (TOPROL-XL) 100 MG 24 hr  tablet TAKE 1 TABLET BY MOUTH DAILY WITH OR IMMEDIATELY FOLLOWING A MEAL 90 tablet 3  . omeprazole (PRILOSEC) 20 MG capsule Take 1 capsule (20 mg total) by mouth daily. 30 capsule 11  . rosuvastatin (CRESTOR) 10 MG tablet TAKE 1 TABLET BY MOUTH EVERY DAY 90 tablet 1   No current facility-administered medications on file prior to visit.    Past Medical History  Diagnosis Date  . Morbid obesity   . Hyperlipidemia   . Diabetes mellitus   . Chronic kidney disease   . GERD (gastroesophageal reflux disease)   . Sepsis 11/2012    Secondary to cellulitis  . Hypothyroidism   . Hypertension   . Anemia   . Recurrent cellulitis of lower leg     LLE, venous insuff  . OSA (obstructive sleep apnea)   . Obesity hypoventilation syndrome   . Morbid obesity with BMI of 70 and over, adult   . CARDIOMYOPATHY, DILATED   . Depression   . Venous insufficiency of leg      Review of Systems  Constitutional: Negative for fever and chills.  Eyes:       Negative for changes in vision.   Respiratory: Negative for chest tightness and shortness of breath.   Cardiovascular: Positive for leg swelling. Negative for chest pain and palpitations.      Objective:    BP 132/82 mmHg  Pulse 67  Temp(Src) 98 F (36.7 C) (Oral)  Resp 20  Ht 5'  4" (1.626 m)  Wt 467 lb (211.83 kg)  BMI 80.12 kg/m2  SpO2 95% Nursing note and vital signs reviewed.  Physical Exam  Constitutional: She is oriented to person, place, and time. She appears well-developed and well-nourished. No distress.  Morbidly obese female, seated in the chair, appears older than her stated age and is dressed appropriately.  Cardiovascular: Normal rate, regular rhythm, normal heart sounds and intact distal pulses.   Left lower extremity edema greater than right of 1-2+ pitting. No calf tenderness noted. Negative Homan's sign. Distal pulses and sensation are intact.   Pulmonary/Chest: Effort normal and breath sounds normal.  Neurological: She is  alert and oriented to person, place, and time.  Skin: Skin is warm and dry.  Psychiatric: She has a normal mood and affect. Her behavior is normal. Judgment and thought content normal.       Assessment & Plan:   Problem List Items Addressed This Visit      Other   Morbid obesity with BMI of 70 and over, adult    Discussed potential treatment options with patient from exercise and diet to surgery. Referral to bariatric surgery placed. Start Belviq. Discussed importance of behavior change and motivation as even with surgery changes should be made. Follow up in 1 month or sooner if needed to determine progress.       Relevant Medications   Lorcaserin HCl (BELVIQ) 10 MG TABS   Other Relevant Orders   Ambulatory referral to General Surgery   Leg edema, left - Primary    Left lower extremity edema most likely related to venous insufficiency, however cannot rule out DVT. Obtain lower extremity venous Doppler. Continue with compression stockings at this time, decrease sodium in diet, and elevate legs as much as possible. Will call back to the office regarding size for her compression stocking. Currently maintained on diuretic, however most likely will not have an effect on her edema. She has been seen by vascular surgery and ablation is not possible at this time secondary to weight and potential complications. Follow up pending LE venous dopplers.       Relevant Orders   VAS [place] LOWER EXTREMITY VENOUS (DVT)

## 2015-06-10 NOTE — Assessment & Plan Note (Signed)
Discussed potential treatment options with patient from exercise and diet to surgery. Referral to bariatric surgery placed. Start Belviq. Discussed importance of behavior change and motivation as even with surgery changes should be made. Follow up in 1 month or sooner if needed to determine progress.

## 2015-06-12 ENCOUNTER — Telehealth (HOSPITAL_COMMUNITY): Payer: Self-pay | Admitting: *Deleted

## 2015-07-01 ENCOUNTER — Encounter: Payer: Self-pay | Admitting: Internal Medicine

## 2015-07-09 ENCOUNTER — Encounter (HOSPITAL_COMMUNITY): Payer: BC Managed Care – PPO

## 2015-07-17 ENCOUNTER — Other Ambulatory Visit: Payer: Self-pay | Admitting: Internal Medicine

## 2015-07-17 ENCOUNTER — Other Ambulatory Visit: Payer: Self-pay | Admitting: Family

## 2015-07-23 ENCOUNTER — Other Ambulatory Visit: Payer: Self-pay | Admitting: Family

## 2015-07-23 NOTE — Telephone Encounter (Signed)
Has not had TSH checked since 07/24/14.

## 2015-08-26 ENCOUNTER — Emergency Department (HOSPITAL_COMMUNITY): Payer: BC Managed Care – PPO

## 2015-08-26 ENCOUNTER — Emergency Department (HOSPITAL_COMMUNITY)
Admission: EM | Admit: 2015-08-26 | Discharge: 2015-08-26 | Disposition: A | Discharge status: Home / Self Care | Payer: BC Managed Care – PPO | Attending: Emergency Medicine | Admitting: Emergency Medicine

## 2015-08-26 ENCOUNTER — Encounter (HOSPITAL_COMMUNITY): Payer: Self-pay

## 2015-08-26 DIAGNOSIS — Z23 Encounter for immunization: Secondary | ICD-10-CM | POA: Diagnosis not present

## 2015-08-26 DIAGNOSIS — Y92410 Unspecified street and highway as the place of occurrence of the external cause: Secondary | ICD-10-CM | POA: Insufficient documentation

## 2015-08-26 DIAGNOSIS — Z87891 Personal history of nicotine dependence: Secondary | ICD-10-CM | POA: Diagnosis not present

## 2015-08-26 DIAGNOSIS — S80211A Abrasion, right knee, initial encounter: Secondary | ICD-10-CM | POA: Insufficient documentation

## 2015-08-26 DIAGNOSIS — Y998 Other external cause status: Secondary | ICD-10-CM | POA: Insufficient documentation

## 2015-08-26 DIAGNOSIS — Z8619 Personal history of other infectious and parasitic diseases: Secondary | ICD-10-CM | POA: Insufficient documentation

## 2015-08-26 DIAGNOSIS — Z872 Personal history of diseases of the skin and subcutaneous tissue: Secondary | ICD-10-CM | POA: Diagnosis not present

## 2015-08-26 DIAGNOSIS — S30811A Abrasion of abdominal wall, initial encounter: Secondary | ICD-10-CM | POA: Diagnosis not present

## 2015-08-26 DIAGNOSIS — F329 Major depressive disorder, single episode, unspecified: Secondary | ICD-10-CM | POA: Diagnosis not present

## 2015-08-26 DIAGNOSIS — K219 Gastro-esophageal reflux disease without esophagitis: Secondary | ICD-10-CM | POA: Insufficient documentation

## 2015-08-26 DIAGNOSIS — I129 Hypertensive chronic kidney disease with stage 1 through stage 4 chronic kidney disease, or unspecified chronic kidney disease: Secondary | ICD-10-CM | POA: Diagnosis not present

## 2015-08-26 DIAGNOSIS — E119 Type 2 diabetes mellitus without complications: Secondary | ICD-10-CM | POA: Diagnosis not present

## 2015-08-26 DIAGNOSIS — R52 Pain, unspecified: Secondary | ICD-10-CM

## 2015-08-26 DIAGNOSIS — E039 Hypothyroidism, unspecified: Secondary | ICD-10-CM | POA: Insufficient documentation

## 2015-08-26 DIAGNOSIS — S92402A Displaced unspecified fracture of left great toe, initial encounter for closed fracture: Secondary | ICD-10-CM | POA: Diagnosis not present

## 2015-08-26 DIAGNOSIS — Z862 Personal history of diseases of the blood and blood-forming organs and certain disorders involving the immune mechanism: Secondary | ICD-10-CM | POA: Insufficient documentation

## 2015-08-26 DIAGNOSIS — N189 Chronic kidney disease, unspecified: Secondary | ICD-10-CM | POA: Insufficient documentation

## 2015-08-26 DIAGNOSIS — S8991XA Unspecified injury of right lower leg, initial encounter: Secondary | ICD-10-CM | POA: Diagnosis present

## 2015-08-26 DIAGNOSIS — E785 Hyperlipidemia, unspecified: Secondary | ICD-10-CM | POA: Diagnosis not present

## 2015-08-26 DIAGNOSIS — Y9389 Activity, other specified: Secondary | ICD-10-CM | POA: Insufficient documentation

## 2015-08-26 DIAGNOSIS — Z8669 Personal history of other diseases of the nervous system and sense organs: Secondary | ICD-10-CM | POA: Insufficient documentation

## 2015-08-26 MED ORDER — CEPHALEXIN 500 MG PO CAPS
500.0000 mg | ORAL_CAPSULE | Freq: Once | ORAL | Status: AC
  Administered 2015-08-26: 500 mg via ORAL
  Filled 2015-08-26: qty 1

## 2015-08-26 MED ORDER — CEPHALEXIN 500 MG PO CAPS: 500.0000 mg | ORAL_CAPSULE | Freq: Three times a day (TID) | ORAL | Status: DC

## 2015-08-26 MED ORDER — CEFAZOLIN SODIUM 1-5 GM-% IV SOLN
1.0000 g | Freq: Once | INTRAVENOUS | Status: AC
  Administered 2015-08-26: 1 g via INTRAVENOUS
  Filled 2015-08-26: qty 50

## 2015-08-26 MED ORDER — TETANUS-DIPHTH-ACELL PERTUSSIS 5-2.5-18.5 LF-MCG/0.5 IM SUSP
0.5000 mL | Freq: Once | INTRAMUSCULAR | Status: AC
  Administered 2015-08-26: 0.5 mL via INTRAMUSCULAR
  Filled 2015-08-26: qty 0.5

## 2015-08-26 MED ORDER — HYDROMORPHONE HCL 1 MG/ML IJ SOLN
1.0000 mg | Freq: Once | INTRAMUSCULAR | Status: AC
  Administered 2015-08-26: 1 mg via INTRAMUSCULAR
  Filled 2015-08-26: qty 1

## 2015-08-26 MED ORDER — HYDROCODONE-ACETAMINOPHEN 5-325 MG PO TABS
2.0000 | ORAL_TABLET | Freq: Once | ORAL | Status: AC
  Administered 2015-08-26: 2 via ORAL
  Filled 2015-08-26: qty 2

## 2015-08-26 MED ORDER — HYDROCODONE-ACETAMINOPHEN 5-325 MG PO TABS: 1.0000 | ORAL_TABLET | Freq: Four times a day (QID) | ORAL | Status: DC | PRN
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
## 2015-08-26 NOTE — Discharge Instructions (Signed)
Abrasion An abrasion is a cut or scrape of the skin. Abrasions do not extend through all layers of the skin and most heal within 10 days. It is important to care for your abrasion properly to prevent infection. CAUSES  Most abrasions are caused by falling on, or gliding across, the ground or other surface. When your skin rubs on something, the outer and inner layer of skin rubs off, causing an abrasion. DIAGNOSIS  Your caregiver will be able to diagnose an abrasion during a physical exam.  TREATMENT  Your treatment depends on how large and deep the abrasion is. Generally, your abrasion will be cleaned with water and a mild soap to remove any dirt or debris. An antibiotic ointment may be put over the abrasion to prevent an infection. A bandage (dressing) may be wrapped around the abrasion to keep it from getting dirty.  You may need a tetanus shot if:  You cannot remember when you had your last tetanus shot.  You have never had a tetanus shot.  The injury broke your skin. If you get a tetanus shot, your arm may swell, get red, and feel warm to the touch. This is common and not a problem. If you need a tetanus shot and you choose not to have one, there is a rare chance of getting tetanus. Sickness from tetanus can be serious.  HOME CARE INSTRUCTIONS   If a dressing was applied, change it at least once a day or as directed by your caregiver. If the bandage sticks, soak it off with warm water.   Wash the area with water and a mild soap to remove all the ointment 2 times a day. Rinse off the soap and pat the area dry with a clean towel.   Reapply any ointment as directed by your caregiver. This will help prevent infection and keep the bandage from sticking. Use gauze over the wound and under the dressing to help keep the bandage from sticking.   Change your dressing right away if it becomes wet or dirty.   Only take over-the-counter or prescription medicines for pain, discomfort, or fever as  directed by your caregiver.   Follow up with your caregiver within 24-48 hours for a wound check, or as directed. If you were not given a wound-check appointment, look closely at your abrasion for redness, swelling, or pus. These are signs of infection. SEEK IMMEDIATE MEDICAL CARE IF:   You have increasing pain in the wound.   You have redness, swelling, or tenderness around the wound.   You have pus coming from the wound.   You have a fever or persistent symptoms for more than 2-3 days.  You have a fever and your symptoms suddenly get worse.  You have a bad smell coming from the wound or dressing.  MAKE SURE YOU:   Understand these instructions.  Will watch your condition.  Will get help right away if you are not doing well or get worse. Document Released: 08/24/2005 Document Revised: 10/31/2012 Document Reviewed: 10/18/2011 [facility] Patient Information 2015 [place], [place]. This information is not intended to replace advice given to you by your health care provider. Make sure you discuss any questions you have with your health care provider.  Toe Fracture Your caregiver has diagnosed you as having a fractured toe. A toe fracture is a break in the bone of a toe. "Buddy taping" is a way of splinting your broken toe, by taping the broken toe to the toe next to it.  This "buddy taping" will keep the injured toe from moving beyond normal range of motion. Buddy taping also helps the toe heal in a more normal alignment. It may take 6 to 8 weeks for the toe injury to heal. [place] your toes taped together for as long as directed by your caregiver or until you see a doctor for a follow-up examination. You can change the tape after bathing. Always use a small piece of gauze or cotton between the toes when taping them together. This will help the skin stay dry and prevent infection.  Apply ice to the injury for 15-20 minutes each hour while awake for the first 2  days. Put the ice in a plastic bag and place a towel between the bag of ice and your skin.  After the first 2 days, apply heat to the injured area. Use heat for the next 2 to 3 days. Place a heating pad on the foot or soak the foot in warm water as directed by your caregiver.  Keep your foot elevated as much as possible to lessen swelling.  Wear sturdy, supportive shoes. The shoes should not pinch the toes or fit tightly against the toes.  Your caregiver may prescribe a rigid shoe if your foot is very swollen.  Your may be given crutches if the pain is too great and it hurts too much to walk.  Only take over-the-counter or prescription medicines for pain, discomfort, or fever as directed by your caregiver.  If your caregiver has given you a follow-up appointment, it is very important to keep that appointment. Not keeping the appointment could result in a chronic or permanent injury, pain, and disability. If there is any problem keeping the appointment, you must call back to this facility for assistance. SEEK MEDICAL CARE IF:   You have increased pain or swelling, not relieved with medications.  The pain does not get better after 1 week.  Your injured toe is cold when the others are warm. SEEK IMMEDIATE MEDICAL CARE IF:   The toe becomes cold, numb, or white.  The toe becomes hot (inflamed) and red. Document Released: 11/11/2000 Document Revised: 02/06/2012 Document Reviewed: 06/30/2008 [facility] Patient Information 2015 [place], [place]. This information is not intended to replace advice given to you by your health care provider. Make sure you discuss any questions you have with your health care provider.

## 2015-08-26 NOTE — ED Notes (Signed)
Patient's foot wrapped by ED tech with bulky dressing.

## 2015-09-16 ENCOUNTER — Ambulatory Visit (INDEPENDENT_AMBULATORY_CARE_PROVIDER_SITE_OTHER): Payer: BC Managed Care – PPO | Admitting: Family

## 2015-09-16 ENCOUNTER — Encounter: Payer: Self-pay | Admitting: Family

## 2015-09-16 ENCOUNTER — Ambulatory Visit (INDEPENDENT_AMBULATORY_CARE_PROVIDER_SITE_OTHER)
Admission: RE | Admit: 2015-09-16 | Discharge: 2015-09-16 | Disposition: A | Discharge status: Home / Self Care | Payer: BC Managed Care – PPO | Source: Ambulatory Visit | Attending: Family | Admitting: Family

## 2015-09-16 VITALS — BP 144/90 | HR 71 | Temp 98.4°F | Resp 18 | Ht 64.0 in | Wt >= 6400 oz

## 2015-09-16 DIAGNOSIS — M25532 Pain in left wrist: Secondary | ICD-10-CM | POA: Insufficient documentation

## 2015-09-16 NOTE — Patient Instructions (Signed)
Thank you for choosing [facility].  Summary/Instructions:  Please stop by radiology on the basement level of the building for your x-rays. Your results will be released to [place] (or called to you) after review, usually within 72 hours after test completion. If any treatments or changes are necessary, you will be notified at that same time.  If your symptoms worsen or fail to improve, please contact our office for further instruction, or in case of emergency go directly to the emergency room at the closest medical facility.    Wrist Sprain With Rehab A sprain is an injury in which a ligament that maintains the proper alignment of a joint is partially or completely torn. The ligaments of the wrist are susceptible to sprains. Sprains are classified into three categories. Grade 1 sprains cause pain, but the tendon is not lengthened. Grade 2 sprains include a lengthened ligament because the ligament is stretched or partially ruptured. With grade 2 sprains there is still function, although the function may be diminished. Grade 3 sprains are characterized by a complete tear of the tendon or muscle, and function is usually impaired. SYMPTOMS   Pain tenderness, inflammation, and/or bruising (contusion) of the injury.  A "pop" or tear felt and/or heard at the time of injury.  Decreased wrist function. CAUSES  A wrist sprain occurs when a force is placed on one or more ligaments that is greater than it/they can withstand. Common mechanisms of injury include:  Catching a ball with your hands.  Repetitive and/ or strenuous extension or flexion of the wrist. RISK INCREASES WITH:  Previous wrist injury.  Contact sports (boxing or wrestling).  Activities in which falling is common.  Poor strength and flexibility.  Improperly fitted or padded protective equipment. PREVENTION  Warm up and stretch properly before activity.  Allow for adequate recovery between workouts.  Maintain  physical fitness:  Strength, flexibility, and endurance.  Cardiovascular fitness.  Protect the wrist joint by limiting its motion with the use of taping, braces, or splints.  Protect the wrist after injury for 6 to 12 months. PROGNOSIS  The prognosis for wrist sprains depends on the degree of injury. Grade 1 sprains require 2 to 6 weeks of treatment. Grade 2 sprains require 6 to 8 weeks of treatment, and grade 3 sprains require up to 12 weeks.  RELATED COMPLICATIONS   Prolonged healing time, if improperly treated or re-injured.  Recurrent symptoms that result in a chronic problem.  Injury to nearby structures (bone, cartilage, nerves, or tendons).  Arthritis of the wrist.  Inability to compete in athletics at a high level.  Wrist stiffness or weakness.  Progression to a complete rupture of the ligament. TREATMENT  Treatment initially involves resting from any activities that aggravate the symptoms, and the use of ice and medications to help reduce pain and inflammation. Your caregiver may recommend immobilizing the wrist for a period of time in order to reduce stress on the ligament and allow for healing. After immobilization it is important to perform strengthening and stretching exercises to help regain strength and a full range of motion. These exercises may be completed at home or with a therapist. Surgery is not usually required for wrist sprains, unless the ligament has been ruptured (grade 3 sprain). MEDICATION   If pain medication is necessary, then nonsteroidal anti-inflammatory medications, such as aspirin and ibuprofen, or other minor pain relievers, such as acetaminophen, are often recommended.  Do not take pain medication for 7 days before surgery.  Prescription  pain relievers may be given if deemed necessary by your caregiver. Use only as directed and only as much as you need. HEAT AND COLD  Cold treatment (icing) relieves pain and reduces inflammation. Cold  treatment should be applied for 10 to 15 minutes every 2 to 3 hours for inflammation and pain and immediately after any activity that aggravates your symptoms. Use ice packs or massage the area with a piece of ice (ice massage).  Heat treatment may be used prior to performing the stretching and strengthening activities prescribed by your caregiver, physical therapist, or athletic trainer. Use a heat pack or soak your injury in warm water. SEEK MEDICAL CARE IF:  Treatment seems to offer no benefit, or the condition worsens.  Any medications produce adverse side effects. EXERCISES RANGE OF MOTION (ROM) AND STRETCHING EXERCISES - Wrist Sprain  These exercises may help you when beginning to rehabilitate your injury. Your symptoms may resolve with or without further involvement from your physician, physical therapist or athletic trainer. While completing these exercises, remember:   Restoring tissue flexibility helps normal motion to return to the joints. This allows healthier, less painful movement and activity.  An effective stretch should be held for at least 30 seconds.  A stretch should never be painful. You should only feel a gentle lengthening or release in the stretched tissue. RANGE OF MOTION - Wrist Flexion, Active-Assisted  Extend your right / left elbow with your fingers pointing down.*  Gently pull the back of your hand towards you until you feel a gentle stretch on the top of your forearm.  Hold this position for __________ seconds. Repeat __________ times. Complete this exercise __________ times per day.  *If directed by your physician, physical therapist or athletic trainer, complete this stretch with your elbow bent rather than extended. RANGE OF MOTION - Wrist Extension, Active-Assisted  Extend your right / left elbow and turn your palm upwards.*  Gently pull your palm/fingertips back so your wrist extends and your fingers point more toward the ground.  You should feel a  gentle stretch on the inside of your forearm.  Hold this position for __________ seconds. Repeat __________ times. Complete this exercise __________ times per day. *If directed by your physician, physical therapist or athletic trainer, complete this stretch with your elbow bent, rather than extended. RANGE OF MOTION - Supination, Active  Stand or sit with your elbows at your side. Bend your right / left elbow to 90 degrees.  Turn your palm upward until you feel a gentle stretch on the inside of your forearm.  Hold this position for __________ seconds. Slowly release and return to the starting position. Repeat __________ times. Complete this stretch __________ times per day.  RANGE OF MOTION - Pronation, Active  Stand or sit with your elbows at your side. Bend your right / left elbow to 90 degrees.  Turn your palm downward until you feel a gentle stretch on the top of your forearm.  Hold this position for __________ seconds. Slowly release and return to the starting position. Repeat __________ times. Complete this stretch __________ times per day.  STRETCH - Wrist Flexion  Place the back of your right / left hand on a tabletop leaving your elbow slightly bent. Your fingers should point away from your body.  Gently press the back of your hand down onto the table by straightening your elbow. You should feel a stretch on the top of your forearm.  Hold this position for __________ seconds. Repeat __________  times. Complete this stretch __________ times per day.  STRETCH - Wrist Extension  Place your right / left fingertips on a tabletop leaving your elbow slightly bent. Your fingers should point backwards.  Gently press your fingers and palm down onto the table by straightening your elbow. You should feel a stretch on the inside of your forearm.  Hold this position for __________ seconds. Repeat __________ times. Complete this stretch __________ times per day.  STRENGTHENING EXERCISES  - Wrist Sprain These exercises may help you when beginning to rehabilitate your injury. They may resolve your symptoms with or without further involvement from your physician, physical therapist or athletic trainer. While completing these exercises, remember:   Muscles can gain both the endurance and the strength needed for everyday activities through controlled exercises.  Complete these exercises as instructed by your physician, physical therapist or athletic trainer. Progress with the resistance and repetition exercises only as your caregiver advises. STRENGTH - Wrist Flexors  Sit with your right / left forearm palm-up and fully supported. Your elbow should be resting below the height of your shoulder. Allow your wrist to extend over the edge of the surface.  Loosely holding a __________ weight or a piece of rubber exercise band/tubing, slowly curl your hand up toward your forearm.  Hold this position for __________ seconds. Slowly lower the wrist back to the starting position in a controlled manner. Repeat __________ times. Complete this exercise __________ times per day.  STRENGTH - Wrist Extensors  Sit with your right / left forearm palm-down and fully supported. Your elbow should be resting below the height of your shoulder. Allow your wrist to extend over the edge of the surface.  Loosely holding a __________ weight or a piece of rubber exercise band/tubing, slowly curl your hand up toward your forearm.  Hold this position for __________ seconds. Slowly lower the wrist back to the starting position in a controlled manner. Repeat __________ times. Complete this exercise __________ times per day.  STRENGTH - Ulnar Deviators  Stand with a ____________________ weight in your right / left hand, or sit holding on to the rubber exercise band/tubing with your opposite arm supported.  Move your wrist so that your pinkie travels toward your forearm and your thumb moves away from your  forearm.  Hold this position for __________ seconds and then slowly lower the wrist back to the starting position. Repeat __________ times. Complete this exercise __________ times per day STRENGTH - Radial Deviators  Stand with a ____________________ weight in your  right / left hand, or sit holding on to the rubber exercise band/tubing with your arm supported.  Raise your hand upward in front of you or pull up on the rubber tubing.  Hold this position for __________ seconds and then slowly lower the wrist back to the starting position. Repeat __________ times. Complete this exercise __________ times per day. STRENGTH - Forearm Supinators  Sit with your right / left forearm supported on a table, keeping your elbow below shoulder height. Rest your hand over the edge, palm down.  Gently grip a hammer or a soup ladle.  Without moving your elbow, slowly turn your palm and hand upward to a "thumbs-up" position.  Hold this position for __________ seconds. Slowly return to the starting position. Repeat __________ times. Complete this exercise __________ times per day.  STRENGTH - Forearm Pronators  Sit with your right / left forearm supported on a table, keeping your elbow below shoulder height. Rest your hand over the  edge, palm up.  Gently grip a hammer or a soup ladle.  Without moving your elbow, slowly turn your palm and hand upward to a "thumbs-up" position.  Hold this position for __________ seconds. Slowly return to the starting position. Repeat __________ times. Complete this exercise __________ times per day.  STRENGTH - Grip  Grasp a tennis ball, a dense sponge, or a large, rolled sock in your hand.  Squeeze as hard as you can without increasing any pain.  Hold this position for __________ seconds. Release your grip slowly. Repeat __________ times. Complete this exercise __________ times per day.    This information is not intended to replace advice given to you by your  health care provider. Make sure you discuss any questions you have with your health care provider.   Document Released: 11/14/2005 Document Revised: 08/05/2015 Document Reviewed: 02/26/2009 Elsevier Interactive Patient Education [employer].
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
## 2015-09-16 NOTE — Progress Notes (Signed)
Pre visit review using our clinic review tool, if applicable. No additional management support is needed unless otherwise documented below in the visit note. 

## 2015-09-16 NOTE — Assessment & Plan Note (Signed)
Symptoms and exam consistent with left wrist strain however cannot rule out underlying fracture. Obtain left wrist x-ray. Continue over-the-counter medications and previously prescribed medications as needed for symptom relief. Recommend cockup wrist splint as tolerated. Home exercise therapy initiated. Follow-up if symptoms worsen or fail to improve.

## 2015-10-02 ENCOUNTER — Encounter: Payer: Self-pay | Admitting: Family

## 2015-10-02 ENCOUNTER — Ambulatory Visit (INDEPENDENT_AMBULATORY_CARE_PROVIDER_SITE_OTHER): Payer: BC Managed Care – PPO | Admitting: Family

## 2015-10-02 VITALS — BP 142/80 | HR 66 | Temp 98.0°F | Resp 18 | Ht 62.0 in | Wt >= 6400 oz

## 2015-10-02 DIAGNOSIS — L729 Follicular cyst of the skin and subcutaneous tissue, unspecified: Secondary | ICD-10-CM | POA: Diagnosis not present

## 2015-10-02 NOTE — Progress Notes (Signed)
Pre visit review using our clinic review tool, if applicable. No additional management support is needed unless otherwise documented below in the visit note. 

## 2015-10-02 NOTE — Patient Instructions (Signed)
Thank you for choosing [facility].  Summary/Instructions:  Your prescription(s) have been submitted to your pharmacy or been printed and provided for you. Please take as directed and contact our office if you believe you are having problem(s) with the medication(s) or have any questions.  Referrals have been made during this visit. You should expect to hear back from our schedulers in about 7-10 days in regards to establishing an appointment with the specialists we discussed.   If your symptoms worsen or fail to improve, please contact our office for further instruction, or in case of emergency go directly to the emergency room at the closest medical facility.     

## 2015-10-02 NOTE — Assessment & Plan Note (Signed)
Symptoms and exam consistent with potential subcutaneous cyst. Refer to dermatology for further management and possible biopsy. Follow up if symptoms worsen or fail to improve.

## 2015-10-02 NOTE — Progress Notes (Signed)
Subjective:    Patient ID: Alexandra Beck, female    DOB: 01-06-1981, 34 y.o.   MRN: [id number]  Chief Complaint  Patient presents with  . Cyst    has a cyst on the back of her neck that has been there for a month and has increased in size and starting to cause pain    HPI:  Alexandra Beck is a 34 y.o. female who  has a past medical history of Morbid obesity ([place]); Hyperlipidemia; Diabetes mellitus ([place]); Chronic kidney disease; GERD (gastroesophageal reflux disease); Sepsis ([place]) (11/2012); Hypothyroidism; Hypertension; Anemia; Recurrent cellulitis of lower leg; OSA (obstructive sleep apnea); Obesity hypoventilation syndrome ([place]); Morbid obesity with BMI of 70 and over, adult ([place]); CARDIOMYOPATHY, DILATED; Depression; and Venous insufficiency of leg. and presents today for an acute office visit.   Associated symptom of a cyst located on the back of her neck has been going on for about 1 month. The cyst has increased in size and has started to become uncomfortable. Denies any modifying factors that make it better or worse. Denies fevers or chills.    Allergies  Allergen Reactions  . Aspirin     REACTION: throat swelling, hives  . Doxycycline Other (See Comments)    Abdominal pain  . Lisinopril Cough  . Niaspan [Niacin Er]     Caused flushing    Current Outpatient Prescriptions on File Prior to Visit  Medication Sig Dispense Refill  . hydrochlorothiazide (HYDRODIURIL) 25 MG tablet TAKE 2 TABLETS BY MOUTH EVERY DAY (Patient taking differently: TAKE 50 MG BY MOUTH DAILY) 180 tablet 0  . HYDROcodone-acetaminophen (NORCO/VICODIN) 5-325 MG tablet Take 1 tablet by mouth every 6 (six) hours as needed for moderate pain. 30 tablet 0  . levothyroxine (SYNTHROID, LEVOTHROID) 150 MCG tablet TAKE 1 TABLET BY MOUTH EVERY MORNING BEFORE BREAKFAST (Patient taking differently: TAKE 150 MCG BY  MOUTH EVERY MORNING BEFORE BREAKFAST) 90 tablet 0  . metoprolol succinate (TOPROL-XL) 100 MG 24 hr tablet  TAKE 1 TABLET BY MOUTH DAILY WITH OR IMMEDIATELY FOLLOWING A MEAL (Patient taking differently: TAKE 100 MG  BY MOUTH DAILY WITH OR IMMEDIATELY FOLLOWING A MEAL) 90 tablet 3  . rosuvastatin (CRESTOR) 10 MG tablet TAKE 1 TABLET BY MOUTH EVERY DAY (Patient taking differently: TAKE 10 MG BY MOUTH EVERY DAY) 90 tablet 1   No current facility-administered medications on file prior to visit.     Past Surgical History  Procedure Laterality Date  . Wisdom tooth extraction      Review of Systems  Constitutional: Negative for fever and chills.  Skin:       Positive for cyst.       Objective:    BP 142/80 mmHg  Pulse 66  Temp(Src) 98 F (36.7 C) (Oral)  Resp 18  Ht 5\' 2"  (1.575 m)  Wt 454 lb (205.933 kg)  BMI 83.02 kg/m2  SpO2 92%  LMP 08/26/2015 (Approximate) Nursing note and vital signs reviewed.  Physical Exam  Constitutional: She is oriented to person, place, and time. She appears well-developed and well-nourished. No distress.  Cardiovascular: Normal rate, regular rhythm, normal heart sounds and intact distal pulses.   Pulmonary/Chest: Effort normal and breath sounds normal.  Neurological: She is alert and oriented to person, place, and time.  Skin: Skin is warm and dry.     Psychiatric: She has a normal mood and affect. Her behavior is normal. Judgment and thought content normal.       Assessment & Plan:  Problem List Items Addressed This Visit      Other   Subcutaneous cyst - Primary    Symptoms and exam consistent with potential subcutaneous cyst. Refer to dermatology for further management and possible biopsy. Follow up if symptoms worsen or fail to improve.       Relevant Orders   Ambulatory referral to Dermatology

## 2015-10-14 ENCOUNTER — Telehealth: Payer: Self-pay | Admitting: Family

## 2015-10-14 NOTE — Telephone Encounter (Signed)
Patient is calling to follow up on derm referral. Please follow up

## 2015-10-21 ENCOUNTER — Other Ambulatory Visit: Payer: Self-pay | Admitting: Family

## 2015-10-21 ENCOUNTER — Other Ambulatory Visit: Payer: Self-pay | Admitting: Internal Medicine

## 2015-11-11 ENCOUNTER — Telehealth: Payer: Self-pay

## 2015-11-11 NOTE — Telephone Encounter (Signed)
Left Voice Mail for pt to call back.   RE: Flu Vaccine for 2016  

## 2015-11-17 ENCOUNTER — Ambulatory Visit (INDEPENDENT_AMBULATORY_CARE_PROVIDER_SITE_OTHER): Payer: BC Managed Care – PPO | Admitting: Podiatry

## 2015-11-17 ENCOUNTER — Encounter: Payer: Self-pay | Admitting: Podiatry

## 2015-11-17 VITALS — BP 141/80 | HR 64 | Resp 16

## 2015-11-17 DIAGNOSIS — L603 Nail dystrophy: Secondary | ICD-10-CM | POA: Diagnosis not present
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
## 2015-12-28 ENCOUNTER — Telehealth: Payer: Self-pay | Admitting: *Deleted

## 2015-12-28 NOTE — Telephone Encounter (Signed)
Dr. Milinda Pointer reviewed fungal culture 11/26/2015 as positive for fungus and ordered appt to discuss with pt.  Left message to call for an appt to discuss results.

## 2016-01-21 ENCOUNTER — Other Ambulatory Visit: Payer: Self-pay | Admitting: Family

## 2016-01-25 ENCOUNTER — Other Ambulatory Visit: Payer: Self-pay | Admitting: Family

## 2016-02-19 ENCOUNTER — Encounter: Payer: Self-pay | Admitting: Family

## 2016-02-19 ENCOUNTER — Ambulatory Visit (INDEPENDENT_AMBULATORY_CARE_PROVIDER_SITE_OTHER): Payer: BC Managed Care – PPO | Admitting: Family

## 2016-02-19 VITALS — BP 150/90 | HR 72 | Temp 98.3°F | Resp 22 | Ht 62.0 in | Wt >= 6400 oz

## 2016-02-19 DIAGNOSIS — E119 Type 2 diabetes mellitus without complications: Secondary | ICD-10-CM

## 2016-02-19 DIAGNOSIS — E039 Hypothyroidism, unspecified: Secondary | ICD-10-CM

## 2016-02-19 DIAGNOSIS — I10 Essential (primary) hypertension: Secondary | ICD-10-CM

## 2016-02-19 DIAGNOSIS — E785 Hyperlipidemia, unspecified: Secondary | ICD-10-CM | POA: Diagnosis not present

## 2016-02-19 DIAGNOSIS — E669 Obesity, unspecified: Secondary | ICD-10-CM

## 2016-02-19 DIAGNOSIS — E1169 Type 2 diabetes mellitus with other specified complication: Secondary | ICD-10-CM

## 2016-02-19 MED ORDER — FUROSEMIDE 20 MG PO TABS: 20.0000 mg | ORAL_TABLET | Freq: Every day | ORAL | Status: DC

## 2016-02-19 MED ORDER — LEVOTHYROXINE SODIUM 150 MCG PO TABS: ORAL_TABLET | ORAL | Status: DC

## 2016-02-19 MED ORDER — ROSUVASTATIN CALCIUM 10 MG PO TABS: 10.0000 mg | ORAL_TABLET | Freq: Every day | ORAL | Status: DC

## 2016-02-19 MED ORDER — METOPROLOL SUCCINATE ER 100 MG PO TB24: 100.0000 mg | ORAL_TABLET | Freq: Every day | ORAL | Status: DC

## 2016-02-19 NOTE — Assessment & Plan Note (Signed)
Hypothyroidism with questionable control with most recent TSH in 2015. Obtain TSH. Continue current dosage of levothyroxine pending TSH results.

## 2016-02-19 NOTE — Assessment & Plan Note (Signed)
Hypertension remains slightly elevated above goal 140/90 with current regimen. Continue current dosage of metoprolol. Discontinue hydrochlorothiazide and start furosemide. Encouraged to monitor blood pressure at home. Emphasize importance of lifestyle management through physical activity and nutrition. Follow-up for annual wellness visit.

## 2016-02-19 NOTE — Progress Notes (Signed)
Subjective:    Patient ID: Alexandra Beck, female    DOB: 11-20-1981, 35 y.o.   MRN: [id number]  Chief Complaint  Patient presents with  . Medication Refill    needs refill of all medications    HPI:  Alexandra Beck is a 35 y.o. female who  has a past medical history of Morbid obesity ([place]); Hyperlipidemia; Diabetes mellitus ([place]); Chronic kidney disease; GERD (gastroesophageal reflux disease); Sepsis ([place]) (11/2012); Hypothyroidism; Hypertension; Anemia; Recurrent cellulitis of lower leg; OSA (obstructive sleep apnea); Obesity hypoventilation syndrome ([place]); Morbid obesity with BMI of 70 and over, adult ([place]); CARDIOMYOPATHY, DILATED; Depression; and Venous insufficiency of leg. and presents today for an office follow up.  1.) Hypertension - Currently maintained on hydrochlorothiazide and metoprolol. Reports taking the medication as prescribed and denies adverse side effects. No symptoms of end organ damage. Does not currently take her blood pressure at home.   BP Readings from Last 3 Encounters:  02/19/16 150/90  11/17/15 141/80  10/02/15 142/80    2.)  Hypothyroidism - currently maintained on levothyroxine. Reports taking medications as prescribed and denies adverse side effects. No symptoms of skin, hair, or nail changes. No temperature intolerances.  Lab Results  Component Value Date   TSH 1.390 07/24/2014     3.) Hyperlipidemia - Currently maintained on rosuvastatin. Reports taking medication as prescribed and denies adverse side effects. No myalgias.   Lab Results  Component Value Date   CHOL 169 07/15/2013   HDL 43.70 07/15/2013   LDLCALC 92 07/15/2013   TRIG 165.0* 07/15/2013   CHOLHDL 4 07/15/2013    Allergies  Allergen Reactions  . Aspirin     REACTION: throat swelling, hives  . Doxycycline Other (See Comments)    Abdominal pain  . Lisinopril Cough  . Niaspan [Niacin Er]     Caused flushing     Current Outpatient Prescriptions on File Prior to Visit    Medication Sig Dispense Refill  . metoprolol succinate (TOPROL-XL) 100 MG 24 hr tablet TAKE 1 TABLET BY MOUTH DAILY WITH OR IMMEDIATELY FOLLOWING A MEAL (Patient taking differently: TAKE 100 MG  BY MOUTH DAILY WITH OR IMMEDIATELY FOLLOWING A MEAL) 90 tablet 3   No current facility-administered medications on file prior to visit.     Past Surgical History  Procedure Laterality Date  . Wisdom tooth extraction      Past Medical History  Diagnosis Date  . Morbid obesity ([place])   . Hyperlipidemia   . Diabetes mellitus ([place])   . Chronic kidney disease   . GERD (gastroesophageal reflux disease)   . Sepsis ([place]) 11/2012    Secondary to cellulitis  . Hypothyroidism   . Hypertension   . Anemia   . Recurrent cellulitis of lower leg     LLE, venous insuff  . OSA (obstructive sleep apnea)   . Obesity hypoventilation syndrome ([place])   . Morbid obesity with BMI of 70 and over, adult ([place])   . CARDIOMYOPATHY, DILATED   . Depression   . Venous insufficiency of leg     Review of Systems  Constitutional: Negative for fever and chills.  Eyes:       Negative for changes in vision.  Respiratory: Negative for chest tightness and shortness of breath.   Cardiovascular: Negative for chest pain, palpitations and leg swelling.      Objective:    BP 150/90 mmHg  Pulse 72  Temp(Src) 98.3 F (36.8 C) (Oral)  Resp 22  Ht 5'  2" (1.575 m)  Wt 466 lb (211.376 kg)  BMI 85.21 kg/m2  SpO2 94% Nursing note and vital signs reviewed.   Physical Exam  Constitutional: She is oriented to person, place, and time. She appears well-developed and well-nourished. No distress.  Cardiovascular: Normal rate, regular rhythm, normal heart sounds and intact distal pulses.   Pulmonary/Chest: Effort normal and breath sounds normal.  Neurological: She is alert and oriented to person, place, and time.  Skin: Skin is warm and dry.  Psychiatric: She has a normal mood and affect. Her behavior is normal. Judgment and  thought content normal.       Assessment & Plan:   Problem List Items Addressed This Visit      Cardiovascular and Mediastinum   Essential hypertension - Primary    Hypertension remains slightly elevated above goal 140/90 with current regimen. Continue current dosage of metoprolol. Discontinue hydrochlorothiazide and start furosemide. Encouraged to monitor blood pressure at home. Emphasize importance of lifestyle management through physical activity and nutrition. Follow-up for annual wellness visit.       Relevant Medications   metoprolol succinate (TOPROL-XL) 100 MG 24 hr tablet   rosuvastatin (CRESTOR) 10 MG tablet   furosemide (LASIX) 20 MG tablet     Endocrine   Diabetes mellitus type 2 in obese ([place])    Diabetes with unknown current status and most recent A1c in 2014. Obtain A1c levels. Not currently maintained on medication. Follow-up and additional treatment pending A1c results.      Relevant Medications   rosuvastatin (CRESTOR) 10 MG tablet   Other Relevant Orders   Hemoglobin A1c   Hypothyroidism    Hypothyroidism with questionable control with most recent TSH in 2015. Obtain TSH. Continue current dosage of levothyroxine pending TSH results.      Relevant Medications   metoprolol succinate (TOPROL-XL) 100 MG 24 hr tablet   levothyroxine (SYNTHROID, LEVOTHROID) 150 MCG tablet   Other Relevant Orders   TSH     Other   Hyperlipidemia    Hyperlipidemia currently managed with Crestor. No adverse side effects or myalgias. Obtain lipid profile. Continue current dosage of Crestor pending lipid profile.      Relevant Medications   metoprolol succinate (TOPROL-XL) 100 MG 24 hr tablet   rosuvastatin (CRESTOR) 10 MG tablet   furosemide (LASIX) 20 MG tablet   Other Relevant Orders   Lipid panel

## 2016-02-19 NOTE — Progress Notes (Signed)
Pre visit review using our clinic review tool, if applicable. No additional management support is needed unless otherwise documented below in the visit note. 

## 2016-02-19 NOTE — Assessment & Plan Note (Signed)
Diabetes with unknown current status and most recent A1c in 2014. Obtain A1c levels. Not currently maintained on medication. Follow-up and additional treatment pending A1c results.

## 2016-02-19 NOTE — Assessment & Plan Note (Signed)
Hyperlipidemia currently managed with Crestor. No adverse side effects or myalgias. Obtain lipid profile. Continue current dosage of Crestor pending lipid profile.

## 2016-02-19 NOTE — Patient Instructions (Addendum)
Thank you for choosing [facility].  Summary/Instructions:  Please continue to take your medications as prescribed.   Your prescription(s) have been submitted to your pharmacy or been printed and provided for you. Please take as directed and contact our office if you believe you are having problem(s) with the medication(s) or have any questions.  If your symptoms worsen or fail to improve, please contact our office for further instruction, or in case of emergency go directly to the emergency room at the closest medical facility.     

## 2016-02-21 ENCOUNTER — Encounter: Payer: Self-pay | Admitting: Family

## 2016-03-02 ENCOUNTER — Encounter: Payer: Self-pay | Admitting: Family

## 2016-03-02 ENCOUNTER — Other Ambulatory Visit (INDEPENDENT_AMBULATORY_CARE_PROVIDER_SITE_OTHER): Payer: BC Managed Care – PPO

## 2016-03-02 DIAGNOSIS — E039 Hypothyroidism, unspecified: Secondary | ICD-10-CM | POA: Diagnosis not present

## 2016-03-02 DIAGNOSIS — E119 Type 2 diabetes mellitus without complications: Secondary | ICD-10-CM

## 2016-03-02 DIAGNOSIS — E1169 Type 2 diabetes mellitus with other specified complication: Secondary | ICD-10-CM

## 2016-03-02 DIAGNOSIS — E785 Hyperlipidemia, unspecified: Secondary | ICD-10-CM

## 2016-03-02 DIAGNOSIS — E669 Obesity, unspecified: Secondary | ICD-10-CM

## 2016-03-02 LAB — LIPID PANEL
CHOL/HDL RATIO: 6
Cholesterol: 228 mg/dL — ABNORMAL HIGH (ref 0–200)
HDL: 41.1 mg/dL (ref >39.00)
NONHDL: 186.78
Triglycerides: 295 mg/dL — ABNORMAL HIGH (ref 0.0–149.0)
VLDL: 59 mg/dL — ABNORMAL HIGH (ref 0.0–40.0)

## 2016-03-02 LAB — HEMOGLOBIN A1C: Hgb A1c MFr Bld: 6.9 % — ABNORMAL HIGH (ref 4.6–6.5)

## 2016-03-02 LAB — LDL CHOLESTEROL, DIRECT: Direct LDL: 141 mg/dL

## 2016-03-02 LAB — TSH: TSH: 3.83 u[IU]/mL (ref 0.35–4.50)

## 2016-03-04 ENCOUNTER — Encounter: Payer: Self-pay | Admitting: Family

## 2016-03-07 ENCOUNTER — Encounter: Payer: Self-pay | Admitting: Family

## 2016-03-07 ENCOUNTER — Other Ambulatory Visit: Payer: Self-pay | Admitting: Family

## 2016-03-07 DIAGNOSIS — Z5181 Encounter for therapeutic drug level monitoring: Secondary | ICD-10-CM

## 2016-03-08 ENCOUNTER — Other Ambulatory Visit: Payer: Self-pay | Admitting: Family

## 2016-03-08 DIAGNOSIS — I10 Essential (primary) hypertension: Secondary | ICD-10-CM

## 2016-03-08 MED ORDER — FUROSEMIDE 20 MG PO TABS: 20.0000 mg | ORAL_TABLET | Freq: Every day | ORAL | Status: DC | PRN

## 2016-03-09 MED ORDER — EMPAGLIFLOZIN 10 MG PO TABS: 10.0000 mg | ORAL_TABLET | Freq: Every day | ORAL | Status: DC

## 2016-03-22 ENCOUNTER — Other Ambulatory Visit: Payer: Self-pay | Admitting: Family

## 2016-04-10 ENCOUNTER — Other Ambulatory Visit: Payer: Self-pay | Admitting: Family

## 2016-04-19 ENCOUNTER — Other Ambulatory Visit: Payer: Self-pay | Admitting: Family

## 2016-05-10 ENCOUNTER — Other Ambulatory Visit: Payer: Self-pay | Admitting: Family

## 2016-06-05 ENCOUNTER — Other Ambulatory Visit: Payer: Self-pay | Admitting: Family

## 2016-06-25 ENCOUNTER — Inpatient Hospital Stay (HOSPITAL_COMMUNITY)
Admission: EM | Admit: 2016-06-25 | Discharge: 2016-07-01 | DRG: 603 | Disposition: A | Discharge status: Home / Self Care | Payer: BC Managed Care – PPO | Attending: Internal Medicine | Admitting: Internal Medicine

## 2016-06-25 ENCOUNTER — Encounter (HOSPITAL_COMMUNITY): Payer: Self-pay | Admitting: Emergency Medicine

## 2016-06-25 DIAGNOSIS — I872 Venous insufficiency (chronic) (peripheral): Secondary | ICD-10-CM | POA: Diagnosis present

## 2016-06-25 DIAGNOSIS — I152 Hypertension secondary to endocrine disorders: Secondary | ICD-10-CM | POA: Diagnosis present

## 2016-06-25 DIAGNOSIS — I420 Dilated cardiomyopathy: Secondary | ICD-10-CM | POA: Diagnosis present

## 2016-06-25 DIAGNOSIS — Z886 Allergy status to analgesic agent status: Secondary | ICD-10-CM | POA: Diagnosis not present

## 2016-06-25 DIAGNOSIS — E1122 Type 2 diabetes mellitus with diabetic chronic kidney disease: Secondary | ICD-10-CM | POA: Diagnosis present

## 2016-06-25 DIAGNOSIS — Z87891 Personal history of nicotine dependence: Secondary | ICD-10-CM

## 2016-06-25 DIAGNOSIS — T501X5A Adverse effect of loop [high-ceiling] diuretics, initial encounter: Secondary | ICD-10-CM | POA: Diagnosis present

## 2016-06-25 DIAGNOSIS — N141 Nephropathy induced by other drugs, medicaments and biological substances: Secondary | ICD-10-CM | POA: Diagnosis present

## 2016-06-25 DIAGNOSIS — Z888 Allergy status to other drugs, medicaments and biological substances status: Secondary | ICD-10-CM | POA: Diagnosis not present

## 2016-06-25 DIAGNOSIS — Z7984 Long term (current) use of oral hypoglycemic drugs: Secondary | ICD-10-CM | POA: Diagnosis not present

## 2016-06-25 DIAGNOSIS — G4733 Obstructive sleep apnea (adult) (pediatric): Secondary | ICD-10-CM | POA: Diagnosis not present

## 2016-06-25 DIAGNOSIS — E871 Hypo-osmolality and hyponatremia: Secondary | ICD-10-CM | POA: Diagnosis not present

## 2016-06-25 DIAGNOSIS — I10 Essential (primary) hypertension: Secondary | ICD-10-CM | POA: Diagnosis present

## 2016-06-25 DIAGNOSIS — R509 Fever, unspecified: Secondary | ICD-10-CM | POA: Diagnosis present

## 2016-06-25 DIAGNOSIS — E785 Hyperlipidemia, unspecified: Secondary | ICD-10-CM | POA: Diagnosis present

## 2016-06-25 DIAGNOSIS — K219 Gastro-esophageal reflux disease without esophagitis: Secondary | ICD-10-CM | POA: Diagnosis present

## 2016-06-25 DIAGNOSIS — E669 Obesity, unspecified: Secondary | ICD-10-CM

## 2016-06-25 DIAGNOSIS — I129 Hypertensive chronic kidney disease with stage 1 through stage 4 chronic kidney disease, or unspecified chronic kidney disease: Secondary | ICD-10-CM | POA: Diagnosis present

## 2016-06-25 DIAGNOSIS — Z6841 Body Mass Index (BMI) 40.0 and over, adult: Secondary | ICD-10-CM | POA: Diagnosis not present

## 2016-06-25 DIAGNOSIS — E662 Morbid (severe) obesity with alveolar hypoventilation: Secondary | ICD-10-CM | POA: Diagnosis present

## 2016-06-25 DIAGNOSIS — N179 Acute kidney failure, unspecified: Secondary | ICD-10-CM

## 2016-06-25 DIAGNOSIS — Z8249 Family history of ischemic heart disease and other diseases of the circulatory system: Secondary | ICD-10-CM | POA: Diagnosis not present

## 2016-06-25 DIAGNOSIS — Y92239 Unspecified place in hospital as the place of occurrence of the external cause: Secondary | ICD-10-CM | POA: Diagnosis present

## 2016-06-25 DIAGNOSIS — N183 Chronic kidney disease, stage 3 (moderate): Secondary | ICD-10-CM | POA: Diagnosis present

## 2016-06-25 DIAGNOSIS — L03116 Cellulitis of left lower limb: Secondary | ICD-10-CM

## 2016-06-25 DIAGNOSIS — E119 Type 2 diabetes mellitus without complications: Secondary | ICD-10-CM | POA: Diagnosis not present

## 2016-06-25 DIAGNOSIS — Z79899 Other long term (current) drug therapy: Secondary | ICD-10-CM | POA: Diagnosis not present

## 2016-06-25 DIAGNOSIS — R609 Edema, unspecified: Secondary | ICD-10-CM | POA: Diagnosis not present

## 2016-06-25 DIAGNOSIS — E039 Hypothyroidism, unspecified: Secondary | ICD-10-CM | POA: Diagnosis present

## 2016-06-25 DIAGNOSIS — A46 Erysipelas: Secondary | ICD-10-CM | POA: Diagnosis present

## 2016-06-25 DIAGNOSIS — E1159 Type 2 diabetes mellitus with other circulatory complications: Secondary | ICD-10-CM | POA: Diagnosis present

## 2016-06-25 DIAGNOSIS — E1169 Type 2 diabetes mellitus with other specified complication: Secondary | ICD-10-CM

## 2016-06-25 LAB — CBC WITH DIFFERENTIAL/PLATELET
BASOS ABS: 0 10*3/uL (ref 0.0–0.1)
Basophils Relative: 0 %
EOS PCT: 0 %
Eosinophils Absolute: 0 10*3/uL (ref 0.0–0.7)
HEMATOCRIT: 46.8 % — AB (ref 36.0–46.0)
Hemoglobin: 14.5 g/dL (ref 12.0–15.0)
LYMPHS ABS: 0.4 10*3/uL — AB (ref 0.7–4.0)
Lymphocytes Relative: 3 %
MCH: 27.6 pg (ref 26.0–34.0)
MCHC: 31 g/dL (ref 30.0–36.0)
MCV: 89 fL (ref 78.0–100.0)
MONO ABS: 0.9 10*3/uL (ref 0.1–1.0)
Monocytes Relative: 6 %
Neutro Abs: 12.6 10*3/uL — ABNORMAL HIGH (ref 1.7–7.7)
Neutrophils Relative %: 91 %
PLATELETS: 181 10*3/uL (ref 150–400)
RBC: 5.26 MIL/uL — AB (ref 3.87–5.11)
RDW: 16.5 % — ABNORMAL HIGH (ref 11.5–15.5)
WBC: 13.8 10*3/uL — AB (ref 4.0–10.5)

## 2016-06-25 LAB — COMPREHENSIVE METABOLIC PANEL
ALT: 18 U/L (ref 14–54)
ANION GAP: 9 (ref 5–15)
AST: 21 U/L (ref 15–41)
Albumin: 3.4 g/dL — ABNORMAL LOW (ref 3.5–5.0)
Alkaline Phosphatase: 54 U/L (ref 38–126)
BUN: 17 mg/dL (ref 6–20)
CHLORIDE: 102 mmol/L (ref 101–111)
CO2: 27 mmol/L (ref 22–32)
Calcium: 9.3 mg/dL (ref 8.9–10.3)
Creatinine, Ser: 1.21 mg/dL — ABNORMAL HIGH (ref 0.44–1.00)
GFR, EST NON AFRICAN AMERICAN: 57 mL/min — AB (ref >60)
Glucose, Bld: 124 mg/dL — ABNORMAL HIGH (ref 65–99)
POTASSIUM: 4 mmol/L (ref 3.5–5.1)
Sodium: 138 mmol/L (ref 135–145)
Total Bilirubin: 1.2 mg/dL (ref 0.3–1.2)
Total Protein: 7.1 g/dL (ref 6.5–8.1)

## 2016-06-25 LAB — URINE MICROSCOPIC-ADD ON: BACTERIA UA: NONE SEEN

## 2016-06-25 LAB — I-STAT CG4 LACTIC ACID, ED
LACTIC ACID, VENOUS: 2.19 mmol/L — AB (ref 0.5–1.9)
Lactic Acid, Venous: 3.64 mmol/L (ref 0.5–1.9)

## 2016-06-25 LAB — URINALYSIS, ROUTINE W REFLEX MICROSCOPIC
Bilirubin Urine: NEGATIVE
Ketones, ur: NEGATIVE mg/dL
LEUKOCYTES UA: NEGATIVE
Nitrite: NEGATIVE
PH: 5.5 (ref 5.0–8.0)
PROTEIN: 100 mg/dL — AB
Specific Gravity, Urine: 1.018 (ref 1.005–1.030)

## 2016-06-25 LAB — I-STAT BETA HCG BLOOD, ED (MC, WL, AP ONLY)

## 2016-06-25 MED ORDER — SODIUM CHLORIDE 0.9 % IV BOLUS (SEPSIS)
1000.0000 mL | Freq: Once | INTRAVENOUS | Status: AC
Start: 2016-06-25 — End: 2016-06-25
  Administered 2016-06-25: 1000 mL via INTRAVENOUS

## 2016-06-25 MED ORDER — FENTANYL CITRATE (PF) 100 MCG/2ML IJ SOLN
50.0000 ug | Freq: Once | INTRAMUSCULAR | Status: AC
  Administered 2016-06-25: 50 ug via INTRAVENOUS
  Filled 2016-06-25: qty 2

## 2016-06-25 MED ORDER — CLINDAMYCIN PHOSPHATE 900 MG/50ML IV SOLN
900.0000 mg | Freq: Once | INTRAVENOUS | Status: AC
  Administered 2016-06-25: 900 mg via INTRAVENOUS
  Filled 2016-06-25: qty 50

## 2016-06-25 MED ORDER — HYDROMORPHONE HCL 1 MG/ML IJ SOLN
1.0000 mg | Freq: Once | INTRAMUSCULAR | Status: AC
  Administered 2016-06-25: 1 mg via INTRAVENOUS
  Filled 2016-06-25: qty 1

## 2016-06-25 MED ORDER — ONDANSETRON HCL 4 MG/2ML IJ SOLN
4.0000 mg | Freq: Once | INTRAMUSCULAR | Status: AC
  Administered 2016-06-25: 4 mg via INTRAVENOUS
  Filled 2016-06-25: qty 2

## 2016-06-25 NOTE — ED Provider Notes (Signed)
[facility] DEPT Provider Note   CSN: [id number] Arrival date & time: 06/25/16  [id number]  First Provider Contact:  First MD Initiated Contact with Patient 06/25/16 1956      History   Chief Complaint Chief Complaint  Patient presents with  . Fever  . Wound Infection   HPI Alexandra Beck is a 35 y.o. female.  The history is provided by the patient. No language interpreter was used.  Fever   This is a new problem. The current episode started 6 to 12 hours ago. The problem occurs constantly. The problem has been gradually worsening. The maximum temperature noted was 100 to 100.9 F. The temperature was taken using an oral thermometer. Pertinent negatives include no chest pain, no fussiness, no sleepiness, no diarrhea, no vomiting, no congestion, no headaches, no sore throat, no tugging at ear, no muscle aches and no cough.    Past Medical History:  Diagnosis Date  . Anemia   . CARDIOMYOPATHY, DILATED   . Chronic kidney disease   . Depression   . Diabetes mellitus ([place])   . GERD (gastroesophageal reflux disease)   . Hyperlipidemia   . Hypertension   . Hypothyroidism   . Morbid obesity ([place])   . Morbid obesity with BMI of 70 and over, adult ([place])   . Obesity hypoventilation syndrome ([place])   . OSA (obstructive sleep apnea)   . Recurrent cellulitis of lower leg    LLE, venous insuff  . Sepsis ([place]) 11/2012   Secondary to cellulitis  . Venous insufficiency of leg     Patient Active Problem List   Diagnosis Date Noted  . Cellulitis of left lower extremity 06/25/2016  . Subcutaneous cyst 10/02/2015  . Left wrist pain 09/16/2015  . Leg edema, left 06/10/2015  . Cough 10/15/2014  . Dyspnea 08/05/2014  . Hypoxia 07/23/2014  . Sepsis ([place]) 07/23/2014  . CHF (congestive heart failure) ([place])   . Hyperlipidemia   . Diabetes mellitus type 2 in obese ([place])   . Hypothyroidism   . GERD (gastroesophageal reflux disease)   . Depression   . Venous insufficiency of leg   . Normocytic  anemia 01/08/2013  . Recurrent cellulitis of lower leg 12/25/2012  . OSA (obstructive sleep apnea) 12/25/2012  . Obesity hypoventilation syndrome ([place]) 12/25/2012  . Morbid obesity with BMI of 70 and over, adult ([place]) 12/24/2012  . Essential hypertension 06/26/2007    Past Surgical History:  Procedure Laterality Date  . WISDOM TOOTH EXTRACTION      OB History    No data available       Home Medications    Prior to Admission medications   Medication Sig Start Date End Date Taking? Authorizing Provider  furosemide (LASIX) 20 MG tablet TAKE 1 TO 2 TABLETS(20 TO 40 MG) BY MOUTH DAILY AS NEEDED FOR SWELLING 06/06/16  Yes Golden Circle, FNP  JARDIANCE 10 MG TABS tablet TAKE 1 TABLET BY MOUTH DAILY 06/06/16  Yes Golden Circle, FNP  levothyroxine (SYNTHROID, LEVOTHROID) 150 MCG tablet TAKE 1 TABLET BY MOUTH EVERY MORNING BEFORE BREAKFAST 02/19/16  Yes Golden Circle, FNP  metoprolol succinate (TOPROL-XL) 100 MG 24 hr tablet TAKE 1 TABLET BY MOUTH DAILY WITH OR IMMEDIATELY FOLLOWING A MEAL Patient taking differently: TAKE 100 MG  BY MOUTH DAILY WITH OR IMMEDIATELY FOLLOWING A MEAL 01/21/15  Yes Rowe Clack, MD  rosuvastatin (CRESTOR) 10 MG tablet TAKE 1 TABLET BY MOUTH EVERY DAY 03/22/16  Yes Golden Circle, FNP  Family History Family History  Problem Relation Age of Onset  . Hypertension Mother   . Hypertension Father   . Heart disease Father     before age 2    Social History Social History  Substance Use Topics  . Smoking status: Former Smoker    Packs/day: 0.25    Years: 10.00    Types: Cigarettes    Quit date: 01/21/2011  . Smokeless tobacco: Never Used  . Alcohol use No     Allergies   Aspirin; Doxycycline; Lisinopril; and Niaspan [niacin er]   Review of Systems Review of Systems  Constitutional: Positive for fever. Negative for chills.  HENT: Negative for congestion, ear pain and sore throat.   Eyes: Negative for pain and visual disturbance.    Respiratory: Negative for cough and shortness of breath.   Cardiovascular: Negative for chest pain and palpitations.  Gastrointestinal: Negative for abdominal pain, diarrhea and vomiting.  Genitourinary: Negative for dysuria and hematuria.  Musculoskeletal: Negative for arthralgias and back pain.  Skin: Positive for rash. Negative for color change.  Neurological: Negative for seizures, syncope and headaches.  All other systems reviewed and are negative.    Physical Exam Updated Vital Signs BP 125/64   Pulse 84   Temp 100.4 F (38 C) (Oral)   Resp 13   Wt (!) 215 kg   LMP 06/25/2016   SpO2 (!) 80%   BMI 86.70 kg/m   Physical Exam  Constitutional: She appears well-developed and well-nourished. No distress.  HENT:  Head: Normocephalic and atraumatic.  Eyes: Conjunctivae are normal.  Neck: Neck supple.  Cardiovascular: Normal rate and regular rhythm.   No murmur heard. Pulmonary/Chest: Effort normal and breath sounds normal. No respiratory distress.  Abdominal: Soft. There is no tenderness.  Musculoskeletal: She exhibits no edema.  Neurological: She is alert.  Skin: Skin is warm and dry.     Psychiatric: She has a normal mood and affect.  Nursing note and vitals reviewed.    ED Treatments / Results  Labs (all labs ordered are listed, but only abnormal results are displayed) Labs Reviewed  COMPREHENSIVE METABOLIC PANEL - Abnormal; Notable for the following:       Result Value   Glucose, Bld 124 (*)    Creatinine, Ser 1.21 (*)    Albumin 3.4 (*)    GFR calc non Af Amer 57 (*)    All other components within normal limits  CBC WITH DIFFERENTIAL/PLATELET - Abnormal; Notable for the following:    WBC 13.8 (*)    RBC 5.26 (*)    HCT 46.8 (*)    RDW 16.5 (*)    Neutro Abs 12.6 (*)    Lymphs Abs 0.4 (*)    All other components within normal limits  URINALYSIS, ROUTINE W REFLEX MICROSCOPIC (NOT AT [facility]) - Abnormal; Notable for the following:    Glucose, UA >1000  (*)    Hgb urine dipstick SMALL (*)    Protein, ur 100 (*)    All other components within normal limits  URINE MICROSCOPIC-ADD ON - Abnormal; Notable for the following:    Squamous Epithelial / LPF 0-5 (*)    All other components within normal limits  I-STAT CG4 LACTIC ACID, ED - Abnormal; Notable for the following:    Lactic Acid, Venous 3.64 (*)    All other components within normal limits  I-STAT CG4 LACTIC ACID, ED - Abnormal; Notable for the following:    Lactic Acid, Venous 2.19 (*)  All other components within normal limits  CULTURE, BLOOD (ROUTINE X 2)  CULTURE, BLOOD (ROUTINE X 2)  URINE CULTURE  I-STAT BETA HCG BLOOD, ED ([facility], [facility], [facility] ONLY)    EKG  EKG Interpretation None       Radiology No results found.  Procedures Procedures (including critical care time)  Medications Ordered in ED Medications  HYDROmorphone (DILAUDID) injection 1 mg (not administered)  ondansetron (ZOFRAN) injection 4 mg (not administered)  sodium chloride 0.9 % bolus 1,000 mL (0 mLs Intravenous Stopped 06/25/16 2220)  clindamycin (CLEOCIN) IVPB 900 mg (0 mg Intravenous Stopped 06/25/16 2109)  fentaNYL (SUBLIMAZE) injection 50 mcg (50 mcg Intravenous Given 06/25/16 2110)     Initial Impression / Assessment and Plan / ED Course  I have reviewed the triage vital signs and the nursing notes.  Pertinent labs & imaging results that were available during my care of the patient were reviewed by me and considered in my medical decision making (see chart for details).  Clinical Course    Patient is a 35 year old woman with history of type 2 diabetes, obstructive sleep apnea, obesity who presents to emergency department for evaluation of fever and left lower extremity redness, warmth. She feels similar to last time she had cellulitis roughly 2 years ago. Patient denies any cough, shortness of breath, abdominal pain, nausea, vomiting, diarrhea.  Upon arrival here patient alert, oriented in no acute  distress. Initial temperature 100.4, heart rate 90. Blood pressure stable.  Sepsis labs initiated. No hypotension, no lactate above 4. I gave patient 1 L normal saline and started clindamycin for suspected cellulitis of the lower extremity. I considered DVT however skin is warm and exam is more consistent with cellulitis. Deferred lower extremity ultrasound.  Lactate 3.64, UA negative. CMP with mildly elevated creatinine from baseline. White blood cell count 13.8.  Patient admitted to hospitalist service, Dr. Olevia Bowens. Dr. Olevia Bowens saw patient in emergency department and admitted to the hospital. No further emergency department events.  Discussed case my attending, Dr. Gilford Raid  Final Clinical Impressions(s) / ED Diagnoses   Final diagnoses:  Cellulitis of left lower extremity    New Prescriptions New Prescriptions   No medications on file     Vira Blanco, MD 06/25/16 2303    Isla Pence, MD 06/25/16 [phone]

## 2016-06-25 NOTE — ED Notes (Signed)
Provider at bedside

## 2016-06-25 NOTE — ED Triage Notes (Signed)
Pt states shes been admitted several times for cellulitis in L leg. Pt c/o fever of 102 at home, pt is diaphoretic in triage, temp 100.4, pt states she took some tylenol earlier today.

## 2016-06-25 NOTE — H&P (Signed)
History and Physical    Alexandra Beck [id number] DOB: Aug 20, 1981 DOA: 06/25/2016  PCP: Mauricio Po, FNP   Patient coming from: Home.  Chief Complaint: Fever, pain and redness of left leg.  HPI: Alexandra Beck is a 35 y.o. female with medical history significant of dilated cardiomyopathy, creatinine kidney disease, depression, type 2 diabetes, morbid obesity, hypertension, hyperlipidemia, hypothyroidism, GERD, obstructive sleep apnea on CPAP, recurrent left lower extremity cellulitis and lower extremities venous insufficiency who comes to the emergency department after having progressively worse left lower extremity erythema, calor, tenderness since this morning with an episode of 102F fever at home. She denies any trauma to the area and previous episodes have been a spontaneous as well.  ED Course: The patient received IV fluids, analgesics and IV antibiotics. She states that she has experienced relief. Workup shows WBC of 13.8 K.  Review of Systems: As per HPI otherwise 10 point review of systems negative.   Past Medical History:  Diagnosis Date  . Anemia   . CARDIOMYOPATHY, DILATED   . Chronic kidney disease   . Depression   . Diabetes mellitus ([place])   . GERD (gastroesophageal reflux disease)   . Hyperlipidemia   . Hypertension   . Hypothyroidism   . Morbid obesity ([place])   . Morbid obesity with BMI of 70 and over, adult ([place])   . Obesity hypoventilation syndrome ([place])   . OSA (obstructive sleep apnea)   . Recurrent cellulitis of lower leg    LLE, venous insuff  . Sepsis ([place]) 11/2012   Secondary to cellulitis  . Venous insufficiency of leg     Past Surgical History:  Procedure Laterality Date  . WISDOM TOOTH EXTRACTION       reports that she quit smoking about 5 years ago. Her smoking use included Cigarettes. She has a 2.50 pack-year smoking history. She has never used smokeless tobacco. She reports that she does not drink alcohol or use drugs.  Allergies    Allergen Reactions  . Aspirin     REACTION: throat swelling, hives  . Doxycycline Other (See Comments)    Abdominal pain  . Lisinopril Cough  . Niaspan [Niacin Er]     Caused flushing    Family History  Problem Relation Age of Onset  . Hypertension Mother   . Hypertension Father   . Heart disease Father     before age 28  Family history reviewed with the patient  Prior to Admission medications   Medication Sig Start Date End Date Taking? Authorizing Provider  furosemide (LASIX) 20 MG tablet TAKE 1 TO 2 TABLETS(20 TO 40 MG) BY MOUTH DAILY AS NEEDED FOR SWELLING 06/06/16  Yes Golden Circle, FNP  JARDIANCE 10 MG TABS tablet TAKE 1 TABLET BY MOUTH DAILY 06/06/16  Yes Golden Circle, FNP  levothyroxine (SYNTHROID, LEVOTHROID) 150 MCG tablet TAKE 1 TABLET BY MOUTH EVERY MORNING BEFORE BREAKFAST 02/19/16  Yes Golden Circle, FNP  metoprolol succinate (TOPROL-XL) 100 MG 24 hr tablet TAKE 1 TABLET BY MOUTH DAILY WITH OR IMMEDIATELY FOLLOWING A MEAL Patient taking differently: TAKE 100 MG  BY MOUTH DAILY WITH OR IMMEDIATELY FOLLOWING A MEAL 01/21/15  Yes Rowe Clack, MD  rosuvastatin (CRESTOR) 10 MG tablet TAKE 1 TABLET BY MOUTH EVERY DAY 03/22/16  Yes Golden Circle, FNP    Physical Exam: Vitals:   06/25/16 1922 06/25/16 1958 06/25/16 2027 06/25/16 2030  BP: 122/60  122/81 125/64  Pulse: 90  81 84  Resp: 22  17 13   Temp: 100.4 F (38 C)     TempSrc: Oral     SpO2: 94%  98% (!) 80%  Weight:  (!) 215 kg (474 lb)        Constitutional: NAD, calm, comfortable Vitals:   06/25/16 1922 06/25/16 1958 06/25/16 2027 06/25/16 2030  BP: 122/60  122/81 125/64  Pulse: 90  81 84  Resp: 22  17 13   Temp: 100.4 F (38 C)     TempSrc: Oral     SpO2: 94%  98% (!) 80%  Weight:  (!) 215 kg (474 lb)     Eyes: PERRL, lids and conjunctivae normal ENMT: Mucous membranes are moist. Posterior pharynx clear of any exudate or lesions. Normal dentition.  Neck: normal, supple, no masses,  no thyromegaly Respiratory: clear to auscultation bilaterally, no wheezing, no crackles. Normal respiratory effort. No accessory muscle use.  Cardiovascular: Regular rate and rhythm, no murmurs / rubs / gallops. No extremity edema. 2+ pedal pulses. No carotid bruits.  Abdomen: Obese, BS positive, soft, no tenderness, no masses palpated. No hepatosplenomegaly.  Musculoskeletal: No clubbing / cyanosis. Mildly decreased ROM and lymphedema of the                                         extremities, no contractures. Normal muscle tone.                            Skin: LLE erythema, tenderness, calor and edema of most pretibial and calf area.          Multiple small hyperpigmented areas (freckles) on extremities, particularly upper ones. Neurologic: CN 2-12 grossly intact. Sensation intact, DTR normal. Strength 5/5 in all 4.  Psychiatric: Normal judgment and insight. Alert and oriented x 4. Normal mood.     Labs on Admission: I have personally reviewed following labs and imaging studies  CBC:  Recent Labs Lab 06/25/16 1949  WBC 13.8*  NEUTROABS 12.6*  HGB 14.5  HCT 46.8*  MCV 89.0  PLT 0000000   Basic Metabolic Panel:  Recent Labs Lab 06/25/16 1949  NA 138  K 4.0  CL 102  CO2 27  GLUCOSE 124*  BUN 17  CREATININE 1.21*  CALCIUM 9.3   GFR: Estimated Creatinine Clearance: 117.8 mL/min (by C-G formula based on SCr of 1.21 mg/dL). Liver Function Tests:  Recent Labs Lab 06/25/16 1949  AST 21  ALT 18  ALKPHOS 54  BILITOT 1.2  PROT 7.1  ALBUMIN 3.4*    Urine analysis:    Component Value Date/Time   COLORURINE YELLOW 06/25/2016 2005   APPEARANCEUR CLEAR 06/25/2016 2005   LABSPEC 1.018 06/25/2016 2005   PHURINE 5.5 06/25/2016 2005   GLUCOSEU >1000 (A) 06/25/2016 2005   HGBUR SMALL (A) 06/25/2016 2005   BILIRUBINUR NEGATIVE 06/25/2016 2005   KETONESUR NEGATIVE 06/25/2016 2005   PROTEINUR 100 (A) 06/25/2016 2005   UROBILINOGEN 0.2 07/25/2014 1040   NITRITE NEGATIVE  06/25/2016 2005   LEUKOCYTESUR NEGATIVE 06/25/2016 2005     Assessment/Plan Principal Problem:   Cellulitis of left lower extremity Admit to [facility]. Continue IV clindamycin. Analgesics as needed. Acetaminophen for fever. Screen for MRSA by PCR given previous nasal carrier history. Check venous duplex of the lower extremities.  Active Problems:   Essential hypertension Continue metoprolol succinate 100 mg by mouth daily.  Monitor blood pressure.    OSA (obstructive sleep apnea) Continue CPAP at bedtime.    Hyperlipidemia Continue Crestor 10 mg by mouth daily. Monitor LFTs periodically    Diabetes mellitus type 2 in obese (HCC) Carbohydrate modified diet. Continue Jardiance or formulary equivalent. Check CBGs before meals.    Hypothyroidism Continue levothyroxine 150 g by mouth daily. Check TSH in a.m.    GERD (gastroesophageal reflux disease) Pantoprazole 40 mg by mouth daily.    DVT prophylaxis: Lovenox SQ. Code Status: Full code. Family Communication:  Disposition Plan: Admit for IV antibiotic therapy for several days. Consults called: None. Admission status: Inpatient/MedSurg.   Reubin Milan MD [facility] Pager [phone].  If 7PM-7AM, please contact night-coverage [email or web address] Password [facility]  06/25/2016, 10:59 PM

## 2016-06-26 ENCOUNTER — Inpatient Hospital Stay (HOSPITAL_COMMUNITY): Payer: BC Managed Care – PPO

## 2016-06-26 DIAGNOSIS — E785 Hyperlipidemia, unspecified: Secondary | ICD-10-CM

## 2016-06-26 DIAGNOSIS — R609 Edema, unspecified: Secondary | ICD-10-CM

## 2016-06-26 LAB — CBC WITH DIFFERENTIAL/PLATELET
BASOS PCT: 0 %
Basophils Absolute: 0 10*3/uL (ref 0.0–0.1)
EOS ABS: 0 10*3/uL (ref 0.0–0.7)
Eosinophils Relative: 0 %
HCT: 43.9 % (ref 36.0–46.0)
Hemoglobin: 13.1 g/dL (ref 12.0–15.0)
Lymphocytes Relative: 3 %
Lymphs Abs: 0.5 10*3/uL — ABNORMAL LOW (ref 0.7–4.0)
MCH: 26.8 pg (ref 26.0–34.0)
MCHC: 29.8 g/dL — AB (ref 30.0–36.0)
MCV: 89.8 fL (ref 78.0–100.0)
MONO ABS: 0.7 10*3/uL (ref 0.1–1.0)
Monocytes Relative: 4 %
NEUTROS ABS: 15.7 10*3/uL — AB (ref 1.7–7.7)
Neutrophils Relative %: 93 %
PLATELETS: 191 10*3/uL (ref 150–400)
RBC: 4.89 MIL/uL (ref 3.87–5.11)
RDW: 16.9 % — ABNORMAL HIGH (ref 11.5–15.5)
WBC Morphology: INCREASED
WBC: 16.9 10*3/uL — ABNORMAL HIGH (ref 4.0–10.5)

## 2016-06-26 LAB — COMPREHENSIVE METABOLIC PANEL
ALT: 15 U/L (ref 14–54)
ANION GAP: 8 (ref 5–15)
AST: 28 U/L (ref 15–41)
Albumin: 3 g/dL — ABNORMAL LOW (ref 3.5–5.0)
Alkaline Phosphatase: 46 U/L (ref 38–126)
BUN: 20 mg/dL (ref 6–20)
CALCIUM: 8.4 mg/dL — AB (ref 8.9–10.3)
CHLORIDE: 101 mmol/L (ref 101–111)
CO2: 27 mmol/L (ref 22–32)
Creatinine, Ser: 1.38 mg/dL — ABNORMAL HIGH (ref 0.44–1.00)
GFR calc non Af Amer: 48 mL/min — ABNORMAL LOW (ref >60)
GFR, EST AFRICAN AMERICAN: 56 mL/min — AB (ref >60)
Glucose, Bld: 136 mg/dL — ABNORMAL HIGH (ref 65–99)
Potassium: 4.5 mmol/L (ref 3.5–5.1)
SODIUM: 136 mmol/L (ref 135–145)
Total Bilirubin: 1.4 mg/dL — ABNORMAL HIGH (ref 0.3–1.2)
Total Protein: 6.4 g/dL — ABNORMAL LOW (ref 6.5–8.1)

## 2016-06-26 LAB — URINE CULTURE

## 2016-06-26 LAB — GLUCOSE, CAPILLARY
GLUCOSE-CAPILLARY: 114 mg/dL — AB (ref 65–99)
GLUCOSE-CAPILLARY: 132 mg/dL — AB (ref 65–99)
GLUCOSE-CAPILLARY: 146 mg/dL — AB (ref 65–99)
Glucose-Capillary: 159 mg/dL — ABNORMAL HIGH (ref 65–99)
Glucose-Capillary: 127 mg/dL — ABNORMAL HIGH (ref 65–99)

## 2016-06-26 LAB — MRSA PCR SCREENING: MRSA by PCR: NEGATIVE

## 2016-06-26 MED ORDER — CLINDAMYCIN PHOSPHATE 900 MG/50ML IV SOLN
900.0000 mg | Freq: Four times a day (QID) | INTRAVENOUS | Status: DC
  Administered 2016-06-26 – 2016-06-27 (×6): 900 mg via INTRAVENOUS
  Filled 2016-06-26 (×7): qty 50

## 2016-06-26 MED ORDER — INSULIN ASPART 100 UNIT/ML ~~LOC~~ SOLN
0.0000 [IU] | Freq: Three times a day (TID) | SUBCUTANEOUS | Status: DC
  Administered 2016-06-26: 2 [IU] via SUBCUTANEOUS
  Administered 2016-06-27 (×2): 1 [IU] via SUBCUTANEOUS
  Administered 2016-06-28: 2 [IU] via SUBCUTANEOUS
  Administered 2016-06-29: 1 [IU] via SUBCUTANEOUS
  Administered 2016-06-29: 2 [IU] via SUBCUTANEOUS
  Administered 2016-06-30: 1 [IU] via SUBCUTANEOUS

## 2016-06-26 MED ORDER — FUROSEMIDE 20 MG PO TABS
20.0000 mg | ORAL_TABLET | Freq: Every day | ORAL | Status: DC
  Administered 2016-06-26 – 2016-06-27 (×2): 20 mg via ORAL
  Filled 2016-06-26 (×2): qty 1

## 2016-06-26 MED ORDER — SODIUM CHLORIDE 0.9 % IV BOLUS (SEPSIS)
500.0000 mL | Freq: Once | INTRAVENOUS | Status: AC
  Administered 2016-06-26: 500 mL via INTRAVENOUS

## 2016-06-26 MED ORDER — PANTOPRAZOLE SODIUM 40 MG PO TBEC
40.0000 mg | DELAYED_RELEASE_TABLET | Freq: Every day | ORAL | Status: DC
  Administered 2016-06-26 – 2016-07-01 (×7): 40 mg via ORAL
  Filled 2016-06-26 (×7): qty 1

## 2016-06-26 MED ORDER — CANAGLIFLOZIN 100 MG PO TABS
100.0000 mg | ORAL_TABLET | Freq: Every day | ORAL | Status: DC
  Administered 2016-06-26: 100 mg via ORAL
  Filled 2016-06-26: qty 1

## 2016-06-26 MED ORDER — ENOXAPARIN SODIUM 120 MG/0.8ML ~~LOC~~ SOLN
105.0000 mg | SUBCUTANEOUS | Status: DC
  Administered 2016-06-26: 105 mg via SUBCUTANEOUS
  Filled 2016-06-26: qty 0.7

## 2016-06-26 MED ORDER — METOPROLOL SUCCINATE ER 100 MG PO TB24
100.0000 mg | ORAL_TABLET | Freq: Every day | ORAL | Status: DC
  Administered 2016-06-27: 100 mg via ORAL
  Filled 2016-06-26 (×2): qty 1

## 2016-06-26 MED ORDER — LEVOTHYROXINE SODIUM 75 MCG PO TABS
150.0000 ug | ORAL_TABLET | Freq: Every day | ORAL | Status: DC
  Administered 2016-06-26 – 2016-07-01 (×6): 150 ug via ORAL
  Filled 2016-06-26 (×6): qty 2

## 2016-06-26 MED ORDER — INSULIN ASPART 100 UNIT/ML ~~LOC~~ SOLN: 0.0000 [IU] | Freq: Every day | SUBCUTANEOUS | Status: DC

## 2016-06-26 MED ORDER — OXYCODONE HCL 5 MG PO TABS
10.0000 mg | ORAL_TABLET | Freq: Four times a day (QID) | ORAL | Status: DC | PRN
  Administered 2016-06-26 – 2016-06-29 (×6): 10 mg via ORAL
  Filled 2016-06-26 (×6): qty 2

## 2016-06-26 MED ORDER — ACETAMINOPHEN 325 MG PO TABS
650.0000 mg | ORAL_TABLET | ORAL | Status: DC | PRN
  Administered 2016-06-26 – 2016-06-28 (×4): 650 mg via ORAL
  Filled 2016-06-26 (×4): qty 2

## 2016-06-26 MED ORDER — ENOXAPARIN SODIUM 100 MG/ML ~~LOC~~ SOLN
100.0000 mg | SUBCUTANEOUS | Status: DC
  Administered 2016-06-27 – 2016-07-01 (×5): 100 mg via SUBCUTANEOUS
  Filled 2016-06-26 (×5): qty 1

## 2016-06-26 MED ORDER — ROSUVASTATIN CALCIUM 10 MG PO TABS
10.0000 mg | ORAL_TABLET | Freq: Every day | ORAL | Status: DC
  Administered 2016-06-26 – 2016-06-30 (×5): 10 mg via ORAL
  Filled 2016-06-26 (×6): qty 1

## 2016-06-26 NOTE — Progress Notes (Signed)
PROGRESS NOTE  Alexandra Beck  [id number] DOB: 09-Aug-1981  DOA: 06/25/2016 PCP: Mauricio Po, FNP   Brief Narrative:  *35-year-old female with PMH of morbid obesity, dilated cardiomyopathy, chronic kidney disease, DM, HLD, HTN, hypothyroid, GERD, OHS/OSA on CPAP, recurrent lower extremity cellulitis complicating venous insufficiency, presented to [facility] ED on 06/25/16 with subacute onset of progressive left lower extremity erythema, pain and fever of 102F at home. No history of trauma. Admitted for recurrent cellulitis of left lower extremity.   Assessment & Plan:   Principal Problem:   Cellulitis of left lower extremity Active Problems:   Essential hypertension   OSA (obstructive sleep apnea)   Hyperlipidemia   Diabetes mellitus type 2 in obese (HCC)   Hypothyroidism   GERD (gastroesophageal reflux disease)   Cellulitis of left lower extremity, recurrent - Complicating venous insufficiency. States that she has seen vascular surgery in the past who have suggested laser ablation. - Left lower extremity venous Doppler: Limited/difficult study due to body habitus. Of visualized vein segments, no DVT is noted. - Started empirically on IV clindamycin. Improving. Continue same. Elevate left lower extremity. - MRSA by PCR negative. Blood cultures 2: Negative to date. Leukocytosis with left shift/bands.  Essential hypertension/hypotension - Patient had SBP in the 80s while sitting in chair this morning and symptomatic of some dizziness. Recommended laying back in bed, rechecking blood pressure in a little while. Continue metoprolol with holding parameters. Continue furosemide.  OHS/OSA - Continue nightly CPAP.   Morbid obesity/Body mass index is 86.51 kg/m. -? Consider bariatric surgery consultation as outpatient.  Hyperlipidemia - Continue Crestor  Type II DM - Apparently recently diagnosed an A1c said to be 6.9. Hold oral hypoglycemics. Treat with SSI while  hospitalized.  Hypothyroid - Continue levothyroxine.  GERD - PPI  Stage III chronic kidney disease -Baseline creatinine not entirely clear. Follow BMP name.    DVT prophylaxis: Lovenox Code Status: Full  Family Communication: Discussed with patient. No family at bedside. Disposition Plan: DC home when clinically improved, possibly in 48 hours.   Consultants:   None   Procedures:   Vascular Ultrasound 06/26/16 Left lower extremity venous duplex has been completed.  Preliminary findings:  Technically limited due to patient body habitus.  Not all veins were visualized. Of visualized vein segments, no DVT is noted.  Antimicrobials:   IV clindamycin 06/25/16 >   Subjective: Feels better. Fever subsided. Left lower extremity redness improved and pain better.   Objective:  Vitals:   06/26/16 0032 06/26/16 0610 06/26/16 0957 06/26/16 1122  BP: 119/86 129/71 (!) 86/49 118/60  Pulse: 86 89 82 78  Resp: (!) 24 20  18   Temp: (!) 102.2 F (39 C) (!) 101.4 F (38.6 C) 100.2 F (37.9 C)   TempSrc: Oral  Oral   SpO2: 93% 94% 94% 95%  Weight: (!) 214.6 kg (473 lb)     Height: 5\' 2"  (1.575 m)       Intake/Output Summary (Last 24 hours) at 06/26/16 1533 Last data filed at 06/26/16 0920  Gross per 24 hour  Intake             1360 ml  Output              500 ml  Net              860 ml   Filed Weights   06/25/16 1958 06/26/16 0032  Weight: (!) 215 kg (474 lb) (!) 214.6 kg (473 lb)  Examination:  General exam: Moderately built and morbidly obese female sitting up comfortably in chair this morning.  Respiratory system: Clear to auscultation. Respiratory effort normal. Cardiovascular system: S1 & S2 heard, RRR. No JVD, murmurs, rubs, gallops or clicks. Trace bilateral leg edema Gastrointestinal system: Abdomen is nondistended, soft and nontender. No organomegaly or masses felt. Normal bowel sounds heard. Central nervous system: Alert and oriented. No focal neurological  deficits. Extremities: Symmetric 5 x 5 power. Diffuse but patchy, mild erythema of left leg without increased warmth. Mild tenderness. No fluctuance or crepitus.  Skin: No rashes, lesions or ulcers Psychiatry: Judgement and insight appear normal. Mood & affect appropriate.     Data Reviewed: I have personally reviewed following labs and imaging studies  CBC:  Recent Labs Lab 06/25/16 1949 06/26/16 0311  WBC 13.8* 16.9*  NEUTROABS 12.6* 15.7*  HGB 14.5 13.1  HCT 46.8* 43.9  MCV 89.0 89.8  PLT 181 99991111   Basic Metabolic Panel:  Recent Labs Lab 06/25/16 1949 06/26/16 0311  NA 138 136  K 4.0 4.5  CL 102 101  CO2 27 27  GLUCOSE 124* 136*  BUN 17 20  CREATININE 1.21* 1.38*  CALCIUM 9.3 8.4*   GFR: Estimated Creatinine Clearance: 103.1 mL/min (by C-G formula based on SCr of 1.38 mg/dL). Liver Function Tests:  Recent Labs Lab 06/25/16 1949 06/26/16 0311  AST 21 28  ALT 18 15  ALKPHOS 54 46  BILITOT 1.2 1.4*  PROT 7.1 6.4*  ALBUMIN 3.4* 3.0*   No results for input(s): LIPASE, AMYLASE in the last 168 hours. No results for input(s): AMMONIA in the last 168 hours. Coagulation Profile: No results for input(s): INR, PROTIME in the last 168 hours. Cardiac Enzymes: No results for input(s): CKTOTAL, CKMB, CKMBINDEX, TROPONINI in the last 168 hours. BNP (last 3 results) No results for input(s): PROBNP in the last 8760 hours. HbA1C: No results for input(s): HGBA1C in the last 72 hours. CBG:  Recent Labs Lab 06/26/16 0045 06/26/16 0736 06/26/16 1152  GLUCAP 146* 114* 132*   Lipid Profile: No results for input(s): CHOL, HDL, LDLCALC, TRIG, CHOLHDL, LDLDIRECT in the last 72 hours. Thyroid Function Tests: No results for input(s): TSH, T4TOTAL, FREET4, T3FREE, THYROIDAB in the last 72 hours. Anemia Panel: No results for input(s): VITAMINB12, FOLATE, FERRITIN, TIBC, IRON, RETICCTPCT in the last 72 hours.  Sepsis Labs:  Recent Labs Lab 06/25/16 2009  06/25/16 2243  LATICACIDVEN 3.64* 2.19*    Recent Results (from the past 240 hour(s))  Culture, blood (Routine x 2)     Status: None (Preliminary result)   Collection Time: 06/25/16  7:35 PM  Result Value Ref Range Status   Specimen Description BLOOD RIGHT ARM  Final   Special Requests BOTTLES DRAWN AEROBIC AND ANAEROBIC 5CC   Final   Culture NO GROWTH < 24 HOURS  Final   Report Status PENDING  Incomplete  Culture, blood (Routine x 2)     Status: None (Preliminary result)   Collection Time: 06/25/16  7:40 PM  Result Value Ref Range Status   Specimen Description BLOOD LEFT ARM  Final   Special Requests IN PEDIATRIC BOTTLE 4CC  Final   Culture NO GROWTH < 24 HOURS  Final   Report Status PENDING  Incomplete  MRSA PCR Screening     Status: None   Collection Time: 06/26/16 12:21 AM  Result Value Ref Range Status   MRSA by PCR NEGATIVE NEGATIVE Final    Comment:  The GeneXpert MRSA Assay (FDA approved for NASAL specimens only), is one component of a comprehensive MRSA colonization surveillance program. It is not intended to diagnose MRSA infection nor to guide or monitor treatment for MRSA infections.          Radiology Studies: No results found.      Scheduled Meds: . canagliflozin  100 mg Oral QAC breakfast  . clindamycin (CLEOCIN) IV  900 mg Intravenous Q6H  . [START ON 06/27/2016] enoxaparin (LOVENOX) injection  100 mg Subcutaneous Q24H  . furosemide  20 mg Oral Daily  . levothyroxine  150 mcg Oral QAC breakfast  . metoprolol succinate  100 mg Oral Daily  . pantoprazole  40 mg Oral Daily  . rosuvastatin  10 mg Oral q1800   Continuous Infusions:    LOS: 1 day    Time spent: 30 minutes.    [facility], MD [facility] Pager [phone]  If 7PM-7AM, please contact night-coverage [email or web address] Password [facility] 06/26/2016, 3:33 PM

## 2016-06-26 NOTE — Progress Notes (Signed)
*  PRELIMINARY RESULTS* Vascular Ultrasound Left lower extremity venous duplex has been completed.  Preliminary findings:  Technically limited due to patient body habitus.  Not all veins were visualized. Of visualized vein segments, no DVT is noted.    Landry Mellow, RDMS, RVT  06/26/2016, 10:59 AM

## 2016-06-27 DIAGNOSIS — A46 Erysipelas: Secondary | ICD-10-CM

## 2016-06-27 DIAGNOSIS — L03116 Cellulitis of left lower limb: Principal | ICD-10-CM

## 2016-06-27 DIAGNOSIS — N183 Chronic kidney disease, stage 3 (moderate): Secondary | ICD-10-CM

## 2016-06-27 LAB — CBC WITH DIFFERENTIAL/PLATELET
Basophils Absolute: 0 10*3/uL (ref 0.0–0.1)
Basophils Relative: 0 %
EOS ABS: 0 10*3/uL (ref 0.0–0.7)
EOS PCT: 0 %
HCT: 43.5 % (ref 36.0–46.0)
Hemoglobin: 12.6 g/dL (ref 12.0–15.0)
LYMPHS ABS: 1.3 10*3/uL (ref 0.7–4.0)
Lymphocytes Relative: 11 %
MCH: 26.4 pg (ref 26.0–34.0)
MCHC: 29 g/dL — AB (ref 30.0–36.0)
MCV: 91.2 fL (ref 78.0–100.0)
MONO ABS: 0.9 10*3/uL (ref 0.1–1.0)
MONOS PCT: 8 %
Neutro Abs: 9.3 10*3/uL — ABNORMAL HIGH (ref 1.7–7.7)
Neutrophils Relative %: 81 %
PLATELETS: 157 10*3/uL (ref 150–400)
RBC: 4.77 MIL/uL (ref 3.87–5.11)
RDW: 17 % — ABNORMAL HIGH (ref 11.5–15.5)
WBC: 11.5 10*3/uL — ABNORMAL HIGH (ref 4.0–10.5)

## 2016-06-27 LAB — BASIC METABOLIC PANEL
Anion gap: 8 (ref 5–15)
BUN: 32 mg/dL — AB (ref 6–20)
CHLORIDE: 98 mmol/L — AB (ref 101–111)
CO2: 28 mmol/L (ref 22–32)
CREATININE: 1.9 mg/dL — AB (ref 0.44–1.00)
Calcium: 8.6 mg/dL — ABNORMAL LOW (ref 8.9–10.3)
GFR calc Af Amer: 38 mL/min — ABNORMAL LOW (ref >60)
GFR, EST NON AFRICAN AMERICAN: 33 mL/min — AB (ref >60)
Glucose, Bld: 111 mg/dL — ABNORMAL HIGH (ref 65–99)
Potassium: 4.7 mmol/L (ref 3.5–5.1)
SODIUM: 134 mmol/L — AB (ref 135–145)

## 2016-06-27 LAB — GLUCOSE, CAPILLARY
GLUCOSE-CAPILLARY: 126 mg/dL — AB (ref 65–99)
GLUCOSE-CAPILLARY: 97 mg/dL (ref 65–99)
GLUCOSE-CAPILLARY: 141 mg/dL — AB (ref 65–99)
Glucose-Capillary: 137 mg/dL — ABNORMAL HIGH (ref 65–99)

## 2016-06-27 MED ORDER — CEFAZOLIN SODIUM-DEXTROSE 2-4 GM/100ML-% IV SOLN
2.0000 g | Freq: Three times a day (TID) | INTRAVENOUS | Status: DC
  Administered 2016-06-27 – 2016-07-01 (×12): 2 g via INTRAVENOUS
  Filled 2016-06-27 (×17): qty 100

## 2016-06-27 MED ORDER — METOPROLOL SUCCINATE ER 50 MG PO TB24
50.0000 mg | ORAL_TABLET | Freq: Every day | ORAL | Status: DC
  Administered 2016-06-28 – 2016-06-30 (×3): 50 mg via ORAL
  Filled 2016-06-27 (×4): qty 1

## 2016-06-27 MED ORDER — SODIUM CHLORIDE 0.9 % IV SOLN
INTRAVENOUS | Status: AC
  Administered 2016-06-27: 20:00:00 via INTRAVENOUS

## 2016-06-27 MED ORDER — HYDROMORPHONE HCL 1 MG/ML IJ SOLN
0.5000 mg | Freq: Three times a day (TID) | INTRAMUSCULAR | Status: DC | PRN
  Administered 2016-06-27 – 2016-06-29 (×4): 0.5 mg via INTRAVENOUS
  Filled 2016-06-27 (×4): qty 1

## 2016-06-27 NOTE — Plan of Care (Signed)
Problem: Physical Regulation: Goal: Ability to avoid or minimize complications of infection will improve Outcome: Progressing Cellulitis area improving

## 2016-06-27 NOTE — Progress Notes (Signed)
Pt reported having difficulty breathing on 3liters oxygen via nasal canula.oxygen saturations 96% when checked

## 2016-06-27 NOTE — Consult Note (Signed)
[facility] for Infectious Disease       Reason for Consult: cellulitis    Referring Physician: Dr. Algis Liming  Principal Problem:   Cellulitis of left lower extremity Active Problems:   Essential hypertension   OSA (obstructive sleep apnea)   Hyperlipidemia   Diabetes mellitus type 2 in obese (HCC)   Hypothyroidism   GERD (gastroesophageal reflux disease)   .  ceFAZolin (ANCEF) IV  2 g Intravenous Q8H  . enoxaparin (LOVENOX) injection  100 mg Subcutaneous Q24H  . furosemide  20 mg Oral Daily  . insulin aspart  0-5 Units Subcutaneous QHS  . insulin aspart  0-9 Units Subcutaneous TID WC  . levothyroxine  150 mcg Oral QAC breakfast  . metoprolol succinate  100 mg Oral Daily  . pantoprazole  40 mg Oral Daily  . rosuvastatin  10 mg Oral q1800    Recommendations: Stop clindamycin Start cefazolin   Dr. Tommy Medal on tomorrow and will follow up  Assessment: She has erysipelas with leukocytosis, fever and well-demarcated erythema on her lower leg.  This is typically Strep and not typically purulent so no indication for imaging at this time.  Cefazolin gives good coverage so will change antibiotics.  Morbid obesity. I discussed with her that it is unknown how she get the infection but that since she is obese, she is more prone to infections.     Antibiotics: clindamycin  HPI: Alexandra Beck is a 35 y.o. female with morbid obesity, ckd, OSA who presented on 7/29 with lower extremity edema, pain, erythema of left leg.  Fever to 102 at home.  No scratches, no trauma to area.  Has had cellulitis in the past and wants to know why she keeps getting and how she got it.  WBC up to 13.8.  No sick contacts.  NO sob, no diarrhea.     Review of Systems:  Constitutional: negative for fevers, chills and anorexia Gastrointestinal: negative for diarrhea Musculoskeletal: negative for myalgias and arthralgias All other systems reviewed and are negative   Past Medical History:  Diagnosis  Date  . Anemia   . CARDIOMYOPATHY, DILATED   . Chronic kidney disease   . Depression   . Diabetes mellitus ([place])   . GERD (gastroesophageal reflux disease)   . Hyperlipidemia   . Hypertension   . Hypothyroidism   . Morbid obesity ([place])   . Morbid obesity with BMI of 70 and over, adult ([place])   . Obesity hypoventilation syndrome ([place])   . OSA (obstructive sleep apnea)   . Recurrent cellulitis of lower leg    LLE, venous insuff  . Sepsis ([place]) 11/2012   Secondary to cellulitis  . Venous insufficiency of leg     Social History  Substance Use Topics  . Smoking status: Former Smoker    Packs/day: 0.25    Years: 10.00    Types: Cigarettes    Quit date: 01/21/2011  . Smokeless tobacco: Never Used  . Alcohol use No    Family History  Problem Relation Age of Onset  . Hypertension Mother   . Hypertension Father   . Heart disease Father     before age 77    Allergies  Allergen Reactions  . Aspirin     REACTION: throat swelling, hives  . Doxycycline Other (See Comments)    Abdominal pain  . Lisinopril Cough  . Niaspan [Niacin Er]     Caused flushing    Physical Exam: Constitutional: in no apparent  distress and alert  Vitals:   06/26/16 2330 06/27/16 0446  BP:  (!) 150/67  Pulse:  84  Resp:  18  Temp: 99.9 F (37.7 C) 99.6 F (37.6 C)   EYES: anicteric ENMT: no thrush Cardiovascular: Cor RRR Respiratory: CTA b; normal respiratory effort GI: Bowel sounds are normal, liver is not enlarged, spleen is not enlarged Musculoskeletal: left let with well demarcated erythema, warmth, minimal tenderness Skin: negatives: no rash Neuro: non focal  Lab Results  Component Value Date   WBC 11.5 (H) 06/27/2016   HGB 12.6 06/27/2016   HCT 43.5 06/27/2016   MCV 91.2 06/27/2016   PLT 157 06/27/2016    Lab Results  Component Value Date   CREATININE 1.90 (H) 06/27/2016   BUN 32 (H) 06/27/2016   NA 134 (L) 06/27/2016   K 4.7 06/27/2016   CL 98 (L) 06/27/2016   CO2 28  06/27/2016    Lab Results  Component Value Date   ALT 15 06/26/2016   AST 28 06/26/2016   ALKPHOS 46 06/26/2016     Microbiology: Recent Results (from the past 240 hour(s))  Culture, blood (Routine x 2)     Status: None (Preliminary result)   Collection Time: 06/25/16  7:35 PM  Result Value Ref Range Status   Specimen Description BLOOD RIGHT ARM  Final   Special Requests BOTTLES DRAWN AEROBIC AND ANAEROBIC 5CC   Final   Culture NO GROWTH < 24 HOURS  Final   Report Status PENDING  Incomplete  Culture, blood (Routine x 2)     Status: None (Preliminary result)   Collection Time: 06/25/16  7:40 PM  Result Value Ref Range Status   Specimen Description BLOOD LEFT ARM  Final   Special Requests IN PEDIATRIC BOTTLE 4CC  Final   Culture NO GROWTH < 24 HOURS  Final   Report Status PENDING  Incomplete  Urine culture     Status: Abnormal   Collection Time: 06/25/16  8:05 PM  Result Value Ref Range Status   Specimen Description URINE, RANDOM  Final   Special Requests NONE  Final   Culture MULTIPLE SPECIES PRESENT, SUGGEST RECOLLECTION (A)  Final   Report Status 06/26/2016 FINAL  Final  MRSA PCR Screening     Status: None   Collection Time: 06/26/16 12:21 AM  Result Value Ref Range Status   MRSA by PCR NEGATIVE NEGATIVE Final    Comment:        The GeneXpert MRSA Assay (FDA approved for NASAL specimens only), is one component of a comprehensive MRSA colonization surveillance program. It is not intended to diagnose MRSA infection nor to guide or monitor treatment for MRSA infections.     Scharlene Gloss, [place] for Infectious Disease [place] [email or web address] [phone] pager  [phone] cell 06/27/2016, 1:22 PM

## 2016-06-27 NOTE — Progress Notes (Signed)
MD paged as pt reports that she is not getting relief from current pain meds

## 2016-06-27 NOTE — Progress Notes (Signed)
Discussed with MD pt condition and he will order a small dose of dilaudid for severe pain due to kidney function

## 2016-06-27 NOTE — Progress Notes (Signed)
Checked on pt as requested by family. Pt noted to be resting comfortably in bed with no signs of distress noted

## 2016-06-27 NOTE — Progress Notes (Signed)
PROGRESS NOTE  Alexandra Beck  [id number] DOB: October 02, 1981  DOA: 06/25/2016 PCP: Mauricio Po, FNP   Brief Narrative:  *35-year-old female with PMH of morbid obesity, dilated cardiomyopathy, chronic kidney disease, DM, HLD, HTN, hypothyroid, GERD, OHS/OSA on CPAP, recurrent lower extremity cellulitis complicating venous insufficiency, presented to [facility] ED on 06/25/16 with subacute onset of progressive left lower extremity erythema, pain and fever of 102F at home. No history of trauma. Admitted for recurrent cellulitis of left lower extremity. Infectious disease consulted.   Assessment & Plan:   Principal Problem:   Cellulitis of left lower extremity Active Problems:   Essential hypertension   OSA (obstructive sleep apnea)   Hyperlipidemia   Diabetes mellitus type 2 in obese (HCC)   Hypothyroidism   GERD (gastroesophageal reflux disease)   Erysipelas/Cellulitis of left lower extremity, recurrent - Complicating venous insufficiency. States that she has seen vascular surgery in the past who have suggested laser ablation. - Left lower extremity venous Doppler: Limited/difficult study due to body habitus. Of visualized vein segments, no DVT is noted. - Started empirically on IV clindamycin. Improving. Continue same. Elevate left lower extremity. - MRSA by PCR negative. Blood cultures 2: Negative to date. Leukocytosis with left shift/bands. - Despite approximately 36 hours of IV antibiotics, no significant change in leg cellulitis on exam. Fevers and leukocytosis better. Hence infectious disease consulted and opined that she has erysipelas and have discontinued clindamycin and started IV cefazolin. No imaging indicated. Monitor.  Essential hypertension/hypotension - Patient had SBP in the 80s while sitting in chair this morning and symptomatic of some dizziness. Recommended laying back in bed, rechecking blood pressure in a little while. Continue metoprolol (reduced dose) with holding  parameters. Continue furosemide.  OHS/OSA - Continue nightly CPAP.   Morbid obesity/Body mass index is 86.51 kg/m. -? Consider bariatric surgery consultation as outpatient.  Hyperlipidemia - Continue Crestor  Type II DM - Apparently recently diagnosed an A1c said to be 6.9. Hold oral hypoglycemics. Treat with SSI while hospitalized.  Hypothyroid - Continue levothyroxine.  GERD - PPI  Acute on stage III Stage III chronic kidney disease -Baseline creatinine not entirely clear. Presented with creatinine of 1.21 which had increased to 1.38 on 7/30. Creatinine has increased to 1.9 (no labs were available earlier this morning). DC Lasix. Brief and gentle IV fluids. Follow BMP in a.m.   DVT prophylaxis: Lovenox Code Status: Full  Family Communication: Discussed with patient. No family at bedside. Disposition Plan: DC home when clinically improved   Consultants:   None   Procedures:   Vascular Ultrasound 06/26/16 Left lower extremity venous duplex has been completed.  Preliminary findings:  Technically limited due to patient body habitus.  Not all veins were visualized. Of visualized vein segments, no DVT is noted.  Antimicrobials:   IV clindamycin 06/25/16 > 7/31  IV cefazolin 7/31 >   Subjective: No significant change in left leg pain or redness or swelling. Denies any other complaints.  Objective:  Vitals:   06/26/16 2203 06/26/16 2330 06/27/16 0446 06/27/16 1450  BP: 114/62  (!) 150/67 (!) 102/58  Pulse: 79  84 68  Resp: 19  18 18   Temp: (!) 101.1 F (38.4 C) 99.9 F (37.7 C) 99.6 F (37.6 C) 98.6 F (37 C)  TempSrc: Oral Oral Oral Oral  SpO2: 92%  98% 96%  Weight:      Height:        Intake/Output Summary (Last 24 hours) at 06/27/16 1455 Last data filed at 06/27/16  1100  Gross per 24 hour  Intake              540 ml  Output             1000 ml  Net             -460 ml   Filed Weights   06/25/16 1958 06/26/16 0032  Weight: (!) 215 kg (474 lb)  (!) 214.6 kg (473 lb)    Examination:  General exam: Moderately built and morbidly obese female lying comfortably supine in bed. Respiratory system: Clear to auscultation. Respiratory effort normal. Cardiovascular system: S1 & S2 heard, RRR. No JVD, murmurs, rubs, gallops or clicks. Trace bilateral leg edema Gastrointestinal system: Abdomen is nondistended, soft and nontender. No organomegaly or masses felt. Normal bowel sounds heard. Central nervous system: Alert and oriented. No focal neurological deficits. Extremities: Symmetric 5 x 5 power. Diffuse circumferential erythema of left leg with associated increased warmth and tenderness. No fluctuance or crepitus. Please see picture below for details. Skin: No rashes, lesions or ulcers Psychiatry: Judgement and insight appear normal. Mood & affect appropriate.         Data Reviewed: I have personally reviewed following labs and imaging studies  CBC:  Recent Labs Lab 06/25/16 1949 06/26/16 0311 06/27/16 0537  WBC 13.8* 16.9* 11.5*  NEUTROABS 12.6* 15.7* 9.3*  HGB 14.5 13.1 12.6  HCT 46.8* 43.9 43.5  MCV 89.0 89.8 91.2  PLT 181 191 [id number]   Basic Metabolic Panel:  Recent Labs Lab 06/25/16 1949 06/26/16 0311 06/27/16 0537  NA 138 136 134*  K 4.0 4.5 4.7  CL 102 101 98*  CO2 27 27 28   GLUCOSE 124* 136* 111*  BUN 17 20 32*  CREATININE 1.21* 1.38* 1.90*  CALCIUM 9.3 8.4* 8.6*   GFR: Estimated Creatinine Clearance: 74.9 mL/min (by C-G formula based on SCr of 1.9 mg/dL). Liver Function Tests:  Recent Labs Lab 06/25/16 1949 06/26/16 0311  AST 21 28  ALT 18 15  ALKPHOS 54 46  BILITOT 1.2 1.4*  PROT 7.1 6.4*  ALBUMIN 3.4* 3.0*   No results for input(s): LIPASE, AMYLASE in the last 168 hours. No results for input(s): AMMONIA in the last 168 hours. Coagulation Profile: No results for input(s): INR, PROTIME in the last 168 hours. Cardiac Enzymes: No results for input(s): CKTOTAL, CKMB, CKMBINDEX, TROPONINI in the  last 168 hours. BNP (last 3 results) No results for input(s): PROBNP in the last 8760 hours. HbA1C: No results for input(s): HGBA1C in the last 72 hours. CBG:  Recent Labs Lab 06/26/16 1152 06/26/16 1721 06/26/16 2151 06/27/16 0806 06/27/16 1216  GLUCAP 132* 159* 127* 126* 137*   Lipid Profile: No results for input(s): CHOL, HDL, LDLCALC, TRIG, CHOLHDL, LDLDIRECT in the last 72 hours. Thyroid Function Tests: No results for input(s): TSH, T4TOTAL, FREET4, T3FREE, THYROIDAB in the last 72 hours. Anemia Panel: No results for input(s): VITAMINB12, FOLATE, FERRITIN, TIBC, IRON, RETICCTPCT in the last 72 hours.  Sepsis Labs:  Recent Labs Lab 06/25/16 2009 06/25/16 2243  LATICACIDVEN 3.64* 2.19*    Recent Results (from the past 240 hour(s))  Culture, blood (Routine x 2)     Status: None (Preliminary result)   Collection Time: 06/25/16  7:35 PM  Result Value Ref Range Status   Specimen Description BLOOD RIGHT ARM  Final   Special Requests BOTTLES DRAWN AEROBIC AND ANAEROBIC 5CC   Final   Culture NO GROWTH < 24 HOURS  Final  Report Status PENDING  Incomplete  Culture, blood (Routine x 2)     Status: None (Preliminary result)   Collection Time: 06/25/16  7:40 PM  Result Value Ref Range Status   Specimen Description BLOOD LEFT ARM  Final   Special Requests IN PEDIATRIC BOTTLE 4CC  Final   Culture NO GROWTH < 24 HOURS  Final   Report Status PENDING  Incomplete  Urine culture     Status: Abnormal   Collection Time: 06/25/16  8:05 PM  Result Value Ref Range Status   Specimen Description URINE, RANDOM  Final   Special Requests NONE  Final   Culture MULTIPLE SPECIES PRESENT, SUGGEST RECOLLECTION (A)  Final   Report Status 06/26/2016 FINAL  Final  MRSA PCR Screening     Status: None   Collection Time: 06/26/16 12:21 AM  Result Value Ref Range Status   MRSA by PCR NEGATIVE NEGATIVE Final    Comment:        The GeneXpert MRSA Assay (FDA approved for NASAL specimens only),  is one component of a comprehensive MRSA colonization surveillance program. It is not intended to diagnose MRSA infection nor to guide or monitor treatment for MRSA infections.          Radiology Studies: No results found.      Scheduled Meds: .  ceFAZolin (ANCEF) IV  2 g Intravenous Q8H  . enoxaparin (LOVENOX) injection  100 mg Subcutaneous Q24H  . furosemide  20 mg Oral Daily  . insulin aspart  0-5 Units Subcutaneous QHS  . insulin aspart  0-9 Units Subcutaneous TID WC  . levothyroxine  150 mcg Oral QAC breakfast  . metoprolol succinate  100 mg Oral Daily  . pantoprazole  40 mg Oral Daily  . rosuvastatin  10 mg Oral q1800   Continuous Infusions:    LOS: 2 days    Time spent: 30 minutes.    [facility], MD [facility] Pager [phone]  If 7PM-7AM, please contact night-coverage [email or web address] Password [email or web address] 06/27/2016, 2:55 PM

## 2016-06-28 DIAGNOSIS — E119 Type 2 diabetes mellitus without complications: Secondary | ICD-10-CM

## 2016-06-28 DIAGNOSIS — N179 Acute kidney failure, unspecified: Secondary | ICD-10-CM

## 2016-06-28 DIAGNOSIS — E669 Obesity, unspecified: Secondary | ICD-10-CM

## 2016-06-28 LAB — BASIC METABOLIC PANEL
Anion gap: 9 (ref 5–15)
BUN: 35 mg/dL — AB (ref 6–20)
CHLORIDE: 99 mmol/L — AB (ref 101–111)
CO2: 24 mmol/L (ref 22–32)
Calcium: 8.4 mg/dL — ABNORMAL LOW (ref 8.9–10.3)
Creatinine, Ser: 1.74 mg/dL — ABNORMAL HIGH (ref 0.44–1.00)
GFR calc Af Amer: 42 mL/min — ABNORMAL LOW (ref >60)
GFR calc non Af Amer: 37 mL/min — ABNORMAL LOW (ref >60)
Glucose, Bld: 144 mg/dL — ABNORMAL HIGH (ref 65–99)
POTASSIUM: 4.7 mmol/L (ref 3.5–5.1)
SODIUM: 132 mmol/L — AB (ref 135–145)

## 2016-06-28 LAB — GLUCOSE, CAPILLARY
GLUCOSE-CAPILLARY: 119 mg/dL — AB (ref 65–99)
GLUCOSE-CAPILLARY: 120 mg/dL — AB (ref 65–99)
Glucose-Capillary: 166 mg/dL — ABNORMAL HIGH (ref 65–99)
Glucose-Capillary: 116 mg/dL — ABNORMAL HIGH (ref 65–99)

## 2016-06-28 MED ORDER — SODIUM CHLORIDE 0.9 % IV SOLN
INTRAVENOUS | Status: AC
Start: 2016-06-28 — End: 2016-06-29
  Administered 2016-06-28 – 2016-06-29 (×2): via INTRAVENOUS

## 2016-06-28 NOTE — Progress Notes (Signed)
PROGRESS NOTE  Alexandra Beck  [id number] DOB: [birth year]  DOA: 06/25/2016 PCP: Mauricio Po, FNP   Brief Narrative:  *35-year-old female with PMH of morbid obesity, dilated cardiomyopathy, chronic kidney disease, DM, HLD, HTN, hypothyroid, GERD, OHS/OSA on CPAP, recurrent lower extremity cellulitis complicating venous insufficiency, presented to [facility] ED on 06/25/16 with subacute onset of progressive left lower extremity erythema, pain and fever of 102F at home. No history of trauma. Admitted for recurrent cellulitis of left lower extremity. Infectious disease consulted. Developed acute kidney injury-improving.   Assessment & Plan:   Principal Problem:   Cellulitis of left lower extremity Active Problems:   Essential hypertension   OSA (obstructive sleep apnea)   Hyperlipidemia   Diabetes mellitus type 2 in obese (HCC)   Hypothyroidism   GERD (gastroesophageal reflux disease)   Erysipelas/Cellulitis of left lower extremity, recurrent - Complicating venous insufficiency. States that she has seen vascular surgery in the past who have suggested laser ablation. - Left lower extremity venous Doppler: Limited/difficult study due to body habitus. Of visualized vein segments, no DVT is noted. - MRSA by PCR negative. Blood cultures 2: Negative to date. Leukocytosis improved. - Since patient's cellulitis was not clinically improving, infectious disease was consulted on 7/31 and changed IV clindamycin to IV cefazolin. Left leg cellulitis seems to be slightly better today. Elevate limb. Continue treatment. ID following. - Blood cultures 2: Negative to date.  Essential hypertension/hypotension - Patient was hypotensive yesterday. Discontinued Lasix especially given acute kidney injury and reduced dose of metoprolol. Blood pressures controlled.  OHS/OSA - Continue nightly CPAP.   Morbid obesity/Body mass index is 86.51 kg/m. -? Consider bariatric surgery consultation as  outpatient.  Hyperlipidemia - Continue Crestor  Type II DM - Apparently recently diagnosed an A1c said to be 6.9. Hold oral hypoglycemics. Treat with SSI while hospitalized. Reasonable inpatient control.  Hypothyroid - Continue levothyroxine.  GERD - PPI  Acute on stage III Stage III chronic kidney disease -Baseline creatinine not entirely clear. Presented with creatinine of 1.21 increased to 1.9 over the next 48 hours. Discontinued Lasix and briefly hydrated with IV fluids. Creatinine has improved to 1.7. Patient was on Lasix PTA secondary to leg edema. Continue brief IV gentle fluid hydration and follow BMP in a.m. Avoid nephrotoxins. - Acute kidney injury likely secondary to infection and Lasix.  DVT prophylaxis: Lovenox Code Status: Full  Family Communication: Discussed with patient. No family at bedside. Disposition Plan: DC home when clinically improved   Consultants:   Infectious disease    Procedures:   Vascular Ultrasound 06/26/16 Left lower extremity venous duplex has been completed.  Preliminary findings:  Technically limited due to patient body habitus.  Not all veins were visualized. Of visualized vein segments, no DVT is noted.  Antimicrobials:   IV clindamycin 06/25/16 > 7/31  IV cefazolin 7/31 >   Subjective: Patient feels that pain and redness in the left leg have slightly improved. Had low-grade fever early this morning. No dyspnea or chest pain reported.  Objective:  Vitals:   06/28/16 0445 06/28/16 0625 06/28/16 0949 06/28/16 1342  BP: 137/79  (!) 142/47 121/60  Pulse: 74  61 60  Resp:    18  Temp: (!) 101 F (38.3 C) 98.6 F (37 C)  98.6 F (37 C)  TempSrc: Oral Oral  Oral  SpO2: 97%   97%  Weight:      Height:        Intake/Output Summary (Last 24 hours) at 06/28/16 1544 Last  data filed at 06/28/16 1034  Gross per 24 hour  Intake              462 ml  Output             1250 ml  Net             -788 ml   Filed Weights   06/25/16  1958 06/26/16 0032  Weight: (!) 215 kg (474 lb) (!) 214.6 kg (473 lb)    Examination:  General exam: Moderately built and morbidly obese female sitting up comfortably in bed. Does not appear septic or toxic. Respiratory system: Clear to auscultation. Respiratory effort normal. Cardiovascular system: S1 & S2 heard, RRR. No JVD, murmurs, rubs, gallops or clicks. Trace bilateral leg edema Gastrointestinal system: Abdomen is nondistended, soft and nontender. No organomegaly or masses felt. Normal bowel sounds heard. Central nervous system: Alert and oriented. No focal neurological deficits. Extremities: Symmetric 5 x 5 power. Diffuse circumferential erythema of left leg with associated increased warmth and tenderness: All findings seem to be slightly better compared to 7/31. No fluctuance or crepitus. Please see picture below for details from 7/31. Skin: No rashes, lesions or ulcers Psychiatry: Judgement and insight appear normal. Mood & affect appropriate.   Left leg 06/27/2016    Right Leg 06/27/2016     Data Reviewed: I have personally reviewed following labs and imaging studies  CBC:  Recent Labs Lab 06/25/16 1949 06/26/16 0311 06/27/16 0537  WBC 13.8* 16.9* 11.5*  NEUTROABS 12.6* 15.7* 9.3*  HGB 14.5 13.1 12.6  HCT 46.8* 43.9 43.5  MCV 89.0 89.8 91.2  PLT 181 191 [id number]   Basic Metabolic Panel:  Recent Labs Lab 06/25/16 1949 06/26/16 0311 06/27/16 0537 06/28/16 0527  NA 138 136 134* 132*  K 4.0 4.5 4.7 4.7  CL 102 101 98* 99*  CO2 27 27 28 24   GLUCOSE 124* 136* 111* 144*  BUN 17 20 32* 35*  CREATININE 1.21* 1.38* 1.90* 1.74*  CALCIUM 9.3 8.4* 8.6* 8.4*   GFR: Estimated Creatinine Clearance: 81.8 mL/min (by C-G formula based on SCr of 1.74 mg/dL). Liver Function Tests:  Recent Labs Lab 06/25/16 1949 06/26/16 0311  AST 21 28  ALT 18 15  ALKPHOS 54 46  BILITOT 1.2 1.4*  PROT 7.1 6.4*  ALBUMIN 3.4* 3.0*   No results for input(s): LIPASE, AMYLASE in the  last 168 hours. No results for input(s): AMMONIA in the last 168 hours. Coagulation Profile: No results for input(s): INR, PROTIME in the last 168 hours. Cardiac Enzymes: No results for input(s): CKTOTAL, CKMB, CKMBINDEX, TROPONINI in the last 168 hours. BNP (last 3 results) No results for input(s): PROBNP in the last 8760 hours. HbA1C: No results for input(s): HGBA1C in the last 72 hours. CBG:  Recent Labs Lab 06/27/16 1216 06/27/16 1711 06/27/16 2119 06/28/16 0727 06/28/16 1137  GLUCAP 137* 97 141* 119* 120*   Lipid Profile: No results for input(s): CHOL, HDL, LDLCALC, TRIG, CHOLHDL, LDLDIRECT in the last 72 hours. Thyroid Function Tests: No results for input(s): TSH, T4TOTAL, FREET4, T3FREE, THYROIDAB in the last 72 hours. Anemia Panel: No results for input(s): VITAMINB12, FOLATE, FERRITIN, TIBC, IRON, RETICCTPCT in the last 72 hours.  Sepsis Labs:  Recent Labs Lab 06/25/16 2009 06/25/16 2243  LATICACIDVEN 3.64* 2.19*    Recent Results (from the past 240 hour(s))  Culture, blood (Routine x 2)     Status: None (Preliminary result)   Collection Time: 06/25/16  7:35 PM  Result Value Ref Range Status   Specimen Description BLOOD RIGHT ARM  Final   Special Requests BOTTLES DRAWN AEROBIC AND ANAEROBIC 5CC   Final   Culture NO GROWTH 3 DAYS  Final   Report Status PENDING  Incomplete  Culture, blood (Routine x 2)     Status: None (Preliminary result)   Collection Time: 06/25/16  7:40 PM  Result Value Ref Range Status   Specimen Description BLOOD LEFT ARM  Final   Special Requests IN PEDIATRIC BOTTLE 4CC  Final   Culture NO GROWTH 3 DAYS  Final   Report Status PENDING  Incomplete  Urine culture     Status: Abnormal   Collection Time: 06/25/16  8:05 PM  Result Value Ref Range Status   Specimen Description URINE, RANDOM  Final   Special Requests NONE  Final   Culture MULTIPLE SPECIES PRESENT, SUGGEST RECOLLECTION (A)  Final   Report Status 06/26/2016 FINAL  Final   MRSA PCR Screening     Status: None   Collection Time: 06/26/16 12:21 AM  Result Value Ref Range Status   MRSA by PCR NEGATIVE NEGATIVE Final    Comment:        The GeneXpert MRSA Assay (FDA approved for NASAL specimens only), is one component of a comprehensive MRSA colonization surveillance program. It is not intended to diagnose MRSA infection nor to guide or monitor treatment for MRSA infections.          Radiology Studies: No results found.      Scheduled Meds: .  ceFAZolin (ANCEF) IV  2 g Intravenous Q8H  . enoxaparin (LOVENOX) injection  100 mg Subcutaneous Q24H  . insulin aspart  0-5 Units Subcutaneous QHS  . insulin aspart  0-9 Units Subcutaneous TID WC  . levothyroxine  150 mcg Oral QAC breakfast  . metoprolol succinate  50 mg Oral Daily  . pantoprazole  40 mg Oral Daily  . rosuvastatin  10 mg Oral q1800   Continuous Infusions:    LOS: 3 days    Time spent: 30 minutes.    [facility], MD [facility] Pager [phone]  If 7PM-7AM, please contact night-coverage [email or web address] Password [facility] 06/28/2016, 3:44 PM

## 2016-06-28 NOTE — Progress Notes (Signed)
Pt.'s O2 sats on 3L O2 while sitting-92%; ambulating with 3L O2- 90%.  Pt.'s O2 sats on RA while sitting: 80%; ambulating on RA: 79%.

## 2016-06-28 NOTE — Progress Notes (Signed)
Subjective:  She was worried about what her WBC and lactic acid levels were, also concerned that infection was spreading to her other leg as it did prevously   Antibiotics:  Anti-infectives    Start     Dose/Rate Route Frequency Ordered Stop   06/27/16 1300  ceFAZolin (ANCEF) IVPB 2g/100 mL premix     2 g 200 mL/hr over 30 Minutes Intravenous Every 8 hours 06/27/16 1237     06/26/16 0600  clindamycin (CLEOCIN) IVPB 900 mg  Status:  Discontinued     900 mg 100 mL/hr over 30 Minutes Intravenous Every 6 hours 06/26/16 0016 06/27/16 1237   06/25/16 2015  clindamycin (CLEOCIN) IVPB 900 mg     900 mg 100 mL/hr over 30 Minutes Intravenous  Once 06/25/16 2008 06/25/16 2109      Medications: Scheduled Meds: .  ceFAZolin (ANCEF) IV  2 g Intravenous Q8H  . enoxaparin (LOVENOX) injection  100 mg Subcutaneous Q24H  . insulin aspart  0-5 Units Subcutaneous QHS  . insulin aspart  0-9 Units Subcutaneous TID WC  . levothyroxine  150 mcg Oral QAC breakfast  . metoprolol succinate  50 mg Oral Daily  . pantoprazole  40 mg Oral Daily  . rosuvastatin  10 mg Oral q1800   Continuous Infusions: . sodium chloride 75 mL/hr at 06/28/16 1600   PRN Meds:.acetaminophen, HYDROmorphone (DILAUDID) injection, oxyCODONE    Objective: Weight change:   Intake/Output Summary (Last 24 hours) at 06/28/16 2027 Last data filed at 06/28/16 1034  Gross per 24 hour  Intake              240 ml  Output              250 ml  Net              -10 ml   Blood pressure 121/60, pulse 60, temperature 98.6 F (37 C), temperature source Oral, resp. rate 18, height 5\' 2"  (1.575 m), weight (!) 473 lb (214.6 kg), last menstrual period 06/25/2016, SpO2 97 %. Temp:  [98 F (36.7 C)-101 F (38.3 C)] 98.6 F (37 C) (08/01 1342) Pulse Rate:  [60-74] 60 (08/01 1342) Resp:  [18] 18 (08/01 1342) BP: (121-142)/(47-88) 121/60 (08/01 1342) SpO2:  [92 %-97 %] 97 % (08/01 1342)  Physical Exam: General: Alert and  awake, oriented x3, having initially been found wearing CPAP mask HEENT: anicteric sclera,  EOMI CVS regular rate, normal r,  no murmur rubs or gallops Chest: clear to auscultation bilaterally, no wheezing, rales or rhonchi Abdomen: soft nontender, nondistended, normal bowel sounds, Extremities/ Skin  06/27/16:      06/28/16:        Right leg   06/27/16:    06/28/16:       Neuro: nonfocal  CBC:  CBC Latest Ref Rng & Units 06/27/2016 06/26/2016 06/25/2016  WBC 4.0 - 10.5 K/uL 11.5(H) 16.9(H) 13.8(H)  Hemoglobin 12.0 - 15.0 g/dL 12.6 13.1 14.5  Hematocrit 36.0 - 46.0 % 43.5 43.9 46.8(H)  Platelets 150 - 400 K/uL 157 191 181      BMET  Recent Labs  06/27/16 0537 06/28/16 0527  NA 134* 132*  K 4.7 4.7  CL 98* 99*  CO2 28 24  GLUCOSE 111* 144*  BUN 32* 35*  CREATININE 1.90* 1.74*  CALCIUM 8.6* 8.4*     Liver Panel   Recent Labs  06/26/16 0311  PROT 6.4*  ALBUMIN 3.0*  AST 28  ALT 15  ALKPHOS 46  BILITOT 1.4*       Sedimentation Rate No results for input(s): ESRSEDRATE in the last 72 hours. C-Reactive Protein No results for input(s): CRP in the last 72 hours.  Micro Results: Recent Results (from the past 720 hour(s))  Culture, blood (Routine x 2)     Status: None (Preliminary result)   Collection Time: 06/25/16  7:35 PM  Result Value Ref Range Status   Specimen Description BLOOD RIGHT ARM  Final   Special Requests BOTTLES DRAWN AEROBIC AND ANAEROBIC 5CC   Final   Culture NO GROWTH 3 DAYS  Final   Report Status PENDING  Incomplete  Culture, blood (Routine x 2)     Status: None (Preliminary result)   Collection Time: 06/25/16  7:40 PM  Result Value Ref Range Status   Specimen Description BLOOD LEFT ARM  Final   Special Requests IN PEDIATRIC BOTTLE 4CC  Final   Culture NO GROWTH 3 DAYS  Final   Report Status PENDING  Incomplete  Urine culture     Status: Abnormal   Collection Time: 06/25/16  8:05 PM  Result Value Ref Range Status     Specimen Description URINE, RANDOM  Final   Special Requests NONE  Final   Culture MULTIPLE SPECIES PRESENT, SUGGEST RECOLLECTION (A)  Final   Report Status 06/26/2016 FINAL  Final  MRSA PCR Screening     Status: None   Collection Time: 06/26/16 12:21 AM  Result Value Ref Range Status   MRSA by PCR NEGATIVE NEGATIVE Final    Comment:        The GeneXpert MRSA Assay (FDA approved for NASAL specimens only), is one component of a comprehensive MRSA colonization surveillance program. It is not intended to diagnose MRSA infection nor to guide or monitor treatment for MRSA infections.     Studies/Results: No results found.    Assessment/Plan:  INTERVAL HISTORY: change from clinda to ancef made   Principal Problem:   Cellulitis of left lower extremity Active Problems:   Essential hypertension   OSA (obstructive sleep apnea)   Hyperlipidemia   Diabetes mellitus type 2 in obese (HCC)   Hypothyroidism   GERD (gastroesophageal reflux disease)   Acute kidney injury ([place])    Alexandra Beck is a 35 y.o. female with morbid obesity, CKD,MD, OHS, OSA recurrent LE cellulitis  #1 Lower extremity cellulitis:  Clinically she appears to have improved when she showed up with sepsis physiology lactic acidosis  She was switched from clindamycin to cefazolin yesterday  It may take quite a long time give her body habibus to see changes in erythema  ELEVATE leg IF possible  She DENIES exposure to fresh or salt water hot tub or other obvious flags for GNR such as vibrio, aeromonas, pseudmonas  #2 Screening: will check her for HIV, she was screened for viral hepatitis in 2014        LOS: 3 days   Alcide Evener 06/28/2016, 8:27 PM

## 2016-06-29 DIAGNOSIS — G4733 Obstructive sleep apnea (adult) (pediatric): Secondary | ICD-10-CM

## 2016-06-29 DIAGNOSIS — I10 Essential (primary) hypertension: Secondary | ICD-10-CM

## 2016-06-29 DIAGNOSIS — K219 Gastro-esophageal reflux disease without esophagitis: Secondary | ICD-10-CM

## 2016-06-29 DIAGNOSIS — E039 Hypothyroidism, unspecified: Secondary | ICD-10-CM

## 2016-06-29 LAB — LACTIC ACID, PLASMA: Lactic Acid, Venous: 0.9 mmol/L (ref 0.5–1.9)

## 2016-06-29 LAB — BASIC METABOLIC PANEL
Anion gap: 6 (ref 5–15)
BUN: 29 mg/dL — ABNORMAL HIGH (ref 6–20)
CALCIUM: 9 mg/dL (ref 8.9–10.3)
CO2: 29 mmol/L (ref 22–32)
CREATININE: 1.21 mg/dL — AB (ref 0.44–1.00)
Chloride: 98 mmol/L — ABNORMAL LOW (ref 101–111)
GFR, EST NON AFRICAN AMERICAN: 57 mL/min — AB (ref >60)
Glucose, Bld: 113 mg/dL — ABNORMAL HIGH (ref 65–99)
Potassium: 4.6 mmol/L (ref 3.5–5.1)
SODIUM: 133 mmol/L — AB (ref 135–145)

## 2016-06-29 LAB — CBC
HEMATOCRIT: 39.8 % (ref 36.0–46.0)
Hemoglobin: 12 g/dL (ref 12.0–15.0)
MCH: 27 pg (ref 26.0–34.0)
MCHC: 30.2 g/dL (ref 30.0–36.0)
MCV: 89.6 fL (ref 78.0–100.0)
PLATELETS: 163 10*3/uL (ref 150–400)
RBC: 4.44 MIL/uL (ref 3.87–5.11)
RDW: 16.9 % — AB (ref 11.5–15.5)
WBC: 6.6 10*3/uL (ref 4.0–10.5)

## 2016-06-29 LAB — GLUCOSE, CAPILLARY
GLUCOSE-CAPILLARY: 127 mg/dL — AB (ref 65–99)
Glucose-Capillary: 118 mg/dL — ABNORMAL HIGH (ref 65–99)
Glucose-Capillary: 152 mg/dL — ABNORMAL HIGH (ref 65–99)
Glucose-Capillary: 144 mg/dL — ABNORMAL HIGH (ref 65–99)

## 2016-06-29 LAB — HIV ANTIBODY (ROUTINE TESTING W REFLEX): HIV SCREEN 4TH GENERATION: NONREACTIVE

## 2016-06-29 LAB — C-REACTIVE PROTEIN: CRP: 23 mg/dL — ABNORMAL HIGH (ref <1.0)

## 2016-06-29 LAB — SEDIMENTATION RATE: Sed Rate: 57 mm/hr — ABNORMAL HIGH (ref 0–22)

## 2016-06-29 MED ORDER — HYDROCODONE-ACETAMINOPHEN 5-325 MG PO TABS
1.0000 | ORAL_TABLET | ORAL | Status: DC | PRN
  Administered 2016-06-29 – 2016-07-01 (×5): 2 via ORAL
  Filled 2016-06-29 (×5): qty 2

## 2016-06-29 NOTE — Progress Notes (Signed)
PROGRESS NOTE  Alexandra Beck  [id number] DOB: 05/19/1981  DOA: 06/25/2016 PCP: Mauricio Po, FNP   Brief Narrative:  *35-year-old female with PMH of morbid obesity, dilated cardiomyopathy, chronic kidney disease, DM, HLD, HTN, hypothyroid, GERD, OHS/OSA on CPAP, recurrent lower extremity cellulitis complicating venous insufficiency, presented to [facility] ED on 06/25/16 with subacute onset of progressive left lower extremity erythema, pain and fever of 102F at home. No history of trauma. Admitted for recurrent cellulitis of left lower extremity. Infectious disease consulted. Developed acute kidney injury-improving.   Assessment & Plan:   Principal Problem:   Cellulitis of left lower extremity Active Problems:   Essential hypertension   OSA (obstructive sleep apnea)   Hyperlipidemia   Diabetes mellitus type 2 in obese (HCC)   Hypothyroidism   GERD (gastroesophageal reflux disease)   Acute kidney injury ([place])   Erysipelas/Cellulitis of left lower extremity, recurrent - Complicating venous insufficiency. States that she has seen vascular surgery in the past who have suggested laser ablation. - Left lower extremity venous Doppler: Limited/difficult study due to body habitus. Of visualized vein segments, no DVT is noted. - MRSA by PCR negative. Blood cultures 2: Negative to date. Leukocytosis improved. Lactic acid normalized.  - Since patient's cellulitis was not clinically improving, infectious disease was consulted on 7/31 and changed IV clindamycin to IV cefazolin. Left leg cellulitis improving, recommend elevating limb. Continue the same treatment. ID following. - hiv negative.    Essential hypertension/hypotension: hypotension resolved Much better today. Resume BB . Continue to hold lasix.   OHS/OSA - Continue nightly CPAP.   Morbid obesity/Body mass index is 86.51 kg/m. -? Consider bariatric surgery consultation as outpatient.  Hyperlipidemia - Continue Crestor  Type II  DM - Apparently recently diagnosed an A1c said to be 6.9. Hold oral hypoglycemics.  CBG (last 3)   Recent Labs  06/28/16 2201 06/29/16 0759 06/29/16 1232  GLUCAP 116* 118* 127*    Resume ssi, while inpatient.  Hypothyroid - Continue levothyroxine.  GERD - PPI  Mild hyponatremia; Improved from yesterday.   Acute on stage III Stage III chronic kidney disease -Baseline creatinine not entirely clear. Presented with creatinine of 1.21 increased to 1.9 over the next 48 hours. Discontinued Lasix and briefly hydrated with IV fluids. Creatinine has improved to 1.2. Avoid nephrotoxins. - Acute kidney injury likely secondary to infection and Lasix. - would continue to hold lasix and monitor creatinine intermittently. - stop the IV fluids.   DVT prophylaxis: Lovenox Code Status: Full  Family Communication: Discussed with patient. No family at bedside. Disposition Plan: DC home when clinically improved   Consultants:   Infectious disease  Dr Tommy Medal  Procedures:   Vascular Ultrasound 06/26/16 Left lower extremity venous duplex has been completed.  Preliminary findings:  Technically limited due to patient body habitus.  Not all veins were visualized. Of visualized vein segments, no DVT is noted.  Antimicrobials:   IV clindamycin 06/25/16 > 7/31  IV cefazolin 7/31 >   Subjective: Reports the oxycodone does n't work for her.   Objective:  Vitals:   06/28/16 2204 06/29/16 0214 06/29/16 0555 06/29/16 1245  BP: 115/71  126/74 139/78  Pulse: 62 69 68 62  Resp: 18 18 18 18   Temp: 97.8 F (36.6 C)  99 F (37.2 C) 98.2 F (36.8 C)  TempSrc: Oral  Oral Oral  SpO2: 96% 98% 96% 93%  Weight:      Height:        Intake/Output Summary (Last 24  hours) at 06/29/16 1541 Last data filed at 06/29/16 0523  Gross per 24 hour  Intake              600 ml  Output                0 ml  Net              600 ml   Filed Weights   06/25/16 1958 06/26/16 0032  Weight: (!) 215 kg  (474 lb) (!) 214.6 kg (473 lb)    Examination:  General exam:  morbidly obese female sitting up comfortably in bed in no distress. Respiratory system: diminished at bases Respiratory effort normal,no wheezing or rhonchi Cardiovascular system: S1 & S2 heard, RRR. No JVD, murmurs, rubs, gallops or clicks.  Gastrointestinal system: Abdomen is nondistended, soft and nontender. Organomegaly cannot be appreciated due to body habitus Normal bowel sounds heard. Central nervous system: Alert and oriented. No focal neurological deficits. Extremities: Symmetric 5 x 5 power. Diffuse circumferential erythema of left leg with associated increased warmth and tenderness Skin: No rashes, lesions or ulcers Psychiatry: slightly anxious.   Left leg 06/27/2016    Right Leg 06/27/2016     Data Reviewed: I have personally reviewed following labs and imaging studies  CBC:  Recent Labs Lab 06/25/16 1949 06/26/16 0311 06/27/16 0537 06/29/16 0457  WBC 13.8* 16.9* 11.5* 6.6  NEUTROABS 12.6* 15.7* 9.3*  --   HGB 14.5 13.1 12.6 12.0  HCT 46.8* 43.9 43.5 39.8  MCV 89.0 89.8 91.2 89.6  PLT 181 191 157 [id number]   Basic Metabolic Panel:  Recent Labs Lab 06/25/16 1949 06/26/16 0311 06/27/16 0537 06/28/16 0527 06/29/16 0457  NA 138 136 134* 132* 133*  K 4.0 4.5 4.7 4.7 4.6  CL 102 101 98* 99* 98*  CO2 27 27 28 24 29   GLUCOSE 124* 136* 111* 144* 113*  BUN 17 20 32* 35* 29*  CREATININE 1.21* 1.38* 1.90* 1.74* 1.21*  CALCIUM 9.3 8.4* 8.6* 8.4* 9.0   GFR: Estimated Creatinine Clearance: 117.6 mL/min (by C-G formula based on SCr of 1.21 mg/dL). Liver Function Tests:  Recent Labs Lab 06/25/16 1949 06/26/16 0311  AST 21 28  ALT 18 15  ALKPHOS 54 46  BILITOT 1.2 1.4*  PROT 7.1 6.4*  ALBUMIN 3.4* 3.0*   No results for input(s): LIPASE, AMYLASE in the last 168 hours. No results for input(s): AMMONIA in the last 168 hours. Coagulation Profile: No results for input(s): INR, PROTIME in the last  168 hours. Cardiac Enzymes: No results for input(s): CKTOTAL, CKMB, CKMBINDEX, TROPONINI in the last 168 hours. BNP (last 3 results) No results for input(s): PROBNP in the last 8760 hours. HbA1C: No results for input(s): HGBA1C in the last 72 hours. CBG:  Recent Labs Lab 06/28/16 1137 06/28/16 1707 06/28/16 2201 06/29/16 0759 06/29/16 1232  GLUCAP 120* 166* 116* 118* 127*   Lipid Profile: No results for input(s): CHOL, HDL, LDLCALC, TRIG, CHOLHDL, LDLDIRECT in the last 72 hours. Thyroid Function Tests: No results for input(s): TSH, T4TOTAL, FREET4, T3FREE, THYROIDAB in the last 72 hours. Anemia Panel: No results for input(s): VITAMINB12, FOLATE, FERRITIN, TIBC, IRON, RETICCTPCT in the last 72 hours.  Sepsis Labs:  Recent Labs Lab 06/25/16 2009 06/25/16 2243 06/29/16 0457  LATICACIDVEN 3.64* 2.19* 0.9    Recent Results (from the past 240 hour(s))  Culture, blood (Routine x 2)     Status: None (Preliminary result)   Collection Time: 06/25/16  7:35 PM  Result Value Ref Range Status   Specimen Description BLOOD RIGHT ARM  Final   Special Requests BOTTLES DRAWN AEROBIC AND ANAEROBIC 5CC   Final   Culture NO GROWTH 4 DAYS  Final   Report Status PENDING  Incomplete  Culture, blood (Routine x 2)     Status: None (Preliminary result)   Collection Time: 06/25/16  7:40 PM  Result Value Ref Range Status   Specimen Description BLOOD LEFT ARM  Final   Special Requests IN PEDIATRIC BOTTLE 4CC  Final   Culture NO GROWTH 4 DAYS  Final   Report Status PENDING  Incomplete  Urine culture     Status: Abnormal   Collection Time: 06/25/16  8:05 PM  Result Value Ref Range Status   Specimen Description URINE, RANDOM  Final   Special Requests NONE  Final   Culture MULTIPLE SPECIES PRESENT, SUGGEST RECOLLECTION (A)  Final   Report Status 06/26/2016 FINAL  Final  MRSA PCR Screening     Status: None   Collection Time: 06/26/16 12:21 AM  Result Value Ref Range Status   MRSA by PCR  NEGATIVE NEGATIVE Final    Comment:        The GeneXpert MRSA Assay (FDA approved for NASAL specimens only), is one component of a comprehensive MRSA colonization surveillance program. It is not intended to diagnose MRSA infection nor to guide or monitor treatment for MRSA infections.          Radiology Studies: No results found.      Scheduled Meds: .  ceFAZolin (ANCEF) IV  2 g Intravenous Q8H  . enoxaparin (LOVENOX) injection  100 mg Subcutaneous Q24H  . insulin aspart  0-5 Units Subcutaneous QHS  . insulin aspart  0-9 Units Subcutaneous TID WC  . levothyroxine  150 mcg Oral QAC breakfast  . metoprolol succinate  50 mg Oral Daily  . pantoprazole  40 mg Oral Daily  . rosuvastatin  10 mg Oral q1800   Continuous Infusions:    LOS: 4 days    Time spent: 30 minutes.    Hosie Poisson, MD [facility] Pager [phone]  If 7PM-7AM, please contact night-coverage [email or web address] Password [email or web address] 06/29/2016, 3:41 PM

## 2016-06-29 NOTE — Progress Notes (Signed)
Subjective:  No new complaints  Antibiotics:  Anti-infectives    Start     Dose/Rate Route Frequency Ordered Stop   06/27/16 1300  ceFAZolin (ANCEF) IVPB 2g/100 mL premix     2 g 200 mL/hr over 30 Minutes Intravenous Every 8 hours 06/27/16 1237     06/26/16 0600  clindamycin (CLEOCIN) IVPB 900 mg  Status:  Discontinued     900 mg 100 mL/hr over 30 Minutes Intravenous Every 6 hours 06/26/16 0016 06/27/16 1237   06/25/16 2015  clindamycin (CLEOCIN) IVPB 900 mg     900 mg 100 mL/hr over 30 Minutes Intravenous  Once 06/25/16 2008 06/25/16 2109      Medications: Scheduled Meds: .  ceFAZolin (ANCEF) IV  2 g Intravenous Q8H  . enoxaparin (LOVENOX) injection  100 mg Subcutaneous Q24H  . insulin aspart  0-5 Units Subcutaneous QHS  . insulin aspart  0-9 Units Subcutaneous TID WC  . levothyroxine  150 mcg Oral QAC breakfast  . metoprolol succinate  50 mg Oral Daily  . pantoprazole  40 mg Oral Daily  . rosuvastatin  10 mg Oral q1800   Continuous Infusions:   PRN Meds:.acetaminophen, HYDROcodone-acetaminophen, HYDROmorphone (DILAUDID) injection    Objective: Weight change:   Intake/Output Summary (Last 24 hours) at 06/29/16 1553 Last data filed at 06/29/16 0523  Gross per 24 hour  Intake              600 ml  Output                0 ml  Net              600 ml   Blood pressure 139/78, pulse 62, temperature 98.2 F (36.8 C), temperature source Oral, resp. rate 18, height 5\' 2"  (1.575 m), weight (!) 473 lb (214.6 kg), last menstrual period 06/25/2016, SpO2 93 %. Temp:  [97.8 F (36.6 C)-99 F (37.2 C)] 98.2 F (36.8 C) (08/02 1245) Pulse Rate:  [62-69] 62 (08/02 1245) Resp:  [18] 18 (08/02 1245) BP: (115-139)/(71-78) 139/78 (08/02 1245) SpO2:  [93 %-98 %] 93 % (08/02 1245)  Physical Exam: General: Alert and awake, oriented x3, having initially been found wearing CPAP mask HEENT: anicteric sclera,  EOMI CVS regular rate, normal r,  no murmur rubs or  gallops Chest: clear to auscultation bilaterally, no wheezing, rales or rhonchi Abdomen: soft nontender, nondistended, normal bowel sounds, Extremities/ Skin  06/27/16:      06/28/16:      06/29/16:        Right leg   06/27/16:    06/28/16:     06/29/16:      Neuro: nonfocal  CBC:  CBC Latest Ref Rng & Units 06/29/2016 06/27/2016 06/26/2016  WBC 4.0 - 10.5 K/uL 6.6 11.5(H) 16.9(H)  Hemoglobin 12.0 - 15.0 g/dL 12.0 12.6 13.1  Hematocrit 36.0 - 46.0 % 39.8 43.5 43.9  Platelets 150 - 400 K/uL 163 157 191      BMET  Recent Labs  06/28/16 0527 06/29/16 0457  NA 132* 133*  K 4.7 4.6  CL 99* 98*  CO2 24 29  GLUCOSE 144* 113*  BUN 35* 29*  CREATININE 1.74* 1.21*  CALCIUM 8.4* 9.0     Liver Panel  No results for input(s): PROT, ALBUMIN, AST, ALT, ALKPHOS, BILITOT, BILIDIR, IBILI in the last 72 hours.     Sedimentation Rate  Recent Labs  06/29/16 0457  ESRSEDRATE 57*   C-Reactive  Protein  Recent Labs  06/29/16 0457  CRP 23.0*    Micro Results: Recent Results (from the past 720 hour(s))  Culture, blood (Routine x 2)     Status: None (Preliminary result)   Collection Time: 06/25/16  7:35 PM  Result Value Ref Range Status   Specimen Description BLOOD RIGHT ARM  Final   Special Requests BOTTLES DRAWN AEROBIC AND ANAEROBIC 5CC   Final   Culture NO GROWTH 4 DAYS  Final   Report Status PENDING  Incomplete  Culture, blood (Routine x 2)     Status: None (Preliminary result)   Collection Time: 06/25/16  7:40 PM  Result Value Ref Range Status   Specimen Description BLOOD LEFT ARM  Final   Special Requests IN PEDIATRIC BOTTLE 4CC  Final   Culture NO GROWTH 4 DAYS  Final   Report Status PENDING  Incomplete  Urine culture     Status: Abnormal   Collection Time: 06/25/16  8:05 PM  Result Value Ref Range Status   Specimen Description URINE, RANDOM  Final   Special Requests NONE  Final   Culture MULTIPLE SPECIES PRESENT, SUGGEST RECOLLECTION  (A)  Final   Report Status 06/26/2016 FINAL  Final  MRSA PCR Screening     Status: None   Collection Time: 06/26/16 12:21 AM  Result Value Ref Range Status   MRSA by PCR NEGATIVE NEGATIVE Final    Comment:        The GeneXpert MRSA Assay (FDA approved for NASAL specimens only), is one component of a comprehensive MRSA colonization surveillance program. It is not intended to diagnose MRSA infection nor to guide or monitor treatment for MRSA infections.     Studies/Results: No results found.    Assessment/Plan:  INTERVAL HISTORY: change from clinda to ancef made  06/29/16: slowly improving erythema, clinically much better  Principal Problem:   Cellulitis of left lower extremity Active Problems:   Essential hypertension   OSA (obstructive sleep apnea)   Hyperlipidemia   Diabetes mellitus type 2 in obese (HCC)   Hypothyroidism   GERD (gastroesophageal reflux disease)   Acute kidney injury ([place])    Alexandra Beck is a 35 y.o. female with morbid obesity, CKD,MD, OHS, OSA recurrent LE cellulitis  #1 Lower extremity cellulitis:  Clinically she is appearing  WHILE an inpatient continue IV ancef 2 grams IV q 8 hours  When she gets ready for DC change to oral augmentin 875/125 mg (for BID dosing and maximal bioavailability of beta lactam and give an additional 2 weeks of this at DC with refill on the bottle  She may need to go on chronic suppressive rx with amoxicillin vs simply always having amox vs keflex on hand to start at first sign of trouble  ELEVATE leg IF possible   I will sign off for now  Please call with further questions.           LOS: 4 days   Alcide Evener 06/29/2016, 3:53 PM

## 2016-06-30 LAB — GLUCOSE, CAPILLARY
GLUCOSE-CAPILLARY: 120 mg/dL — AB (ref 65–99)
GLUCOSE-CAPILLARY: 115 mg/dL — AB (ref 65–99)
Glucose-Capillary: 134 mg/dL — ABNORMAL HIGH (ref 65–99)
Glucose-Capillary: 105 mg/dL — ABNORMAL HIGH (ref 65–99)

## 2016-06-30 LAB — CULTURE, BLOOD (ROUTINE X 2)
CULTURE: NO GROWTH
Culture: NO GROWTH

## 2016-06-30 NOTE — Progress Notes (Signed)
Pt. is able to place self on/off CPAP on own, RT placed X2 Oxygen flowmeters in room, for CPAP & N/C, uses CPAP at home, is aware to notify if needed, plan to check on rounds, RT to monitor.

## 2016-06-30 NOTE — Progress Notes (Signed)
Pt became upset and started crying when asked to get up and mobilize. Stated that she has pain in her leg and head, prn meds for pain administered

## 2016-06-30 NOTE — Plan of Care (Signed)
Problem: Physical Regulation: Goal: Ability to avoid or minimize complications of infection will improve Outcome: Not Progressing Cellulitis to left leg persists  Problem: Health Behavior/Discharge Planning: Goal: Ability to manage health-related needs will improve Outcome: Not Progressing Pt not motivated to get up and walk  Problem: Activity: Goal: Risk for activity intolerance will decrease Outcome: Not Progressing Pt not engaging in out of bed activities at all, reluctant to walk

## 2016-06-30 NOTE — Progress Notes (Signed)
PROGRESS NOTE  Alexandra Beck  [id number] DOB: June 20, 1981  DOA: 06/25/2016 PCP: Mauricio Po, FNP   Brief Narrative:  *35-year-old female with PMH of morbid obesity, dilated cardiomyopathy, chronic kidney disease, DM, HLD, HTN, hypothyroid, GERD, OHS/OSA on CPAP, recurrent lower extremity cellulitis complicating venous insufficiency, presented to [facility] ED on 06/25/16 with subacute onset of progressive left lower extremity erythema, pain and fever of 102F at home. No history of trauma. Admitted for recurrent cellulitis of left lower extremity. Infectious disease consulted. Developed acute kidney injury-improving.   Assessment & Plan:   Principal Problem:   Cellulitis of left lower extremity Active Problems:   Essential hypertension   OSA (obstructive sleep apnea)   Hyperlipidemia   Diabetes mellitus type 2 in obese (HCC)   Hypothyroidism   GERD (gastroesophageal reflux disease)   Acute kidney injury ([place])   Erysipelas/Cellulitis of left lower extremity, recurrent - Complicating venous insufficiency. States that she has seen vascular surgery in the past who have suggested laser ablation. - Left lower extremity venous Doppler: Limited/difficult study due to body habitus. Of visualized vein segments, no DVT is noted. - MRSA by PCR negative. Blood cultures 2: Negative to date. Leukocytosis improved. Lactic acid normalized.  - Since patient's cellulitis was not clinically improving, infectious disease was consulted on 7/31 and changed IV clindamycin to IV cefazolin. Left leg cellulitis improving, recommend elevating limb. Continue the same treatment. ID following. - hiv negative.  - no changed in antibiotics.   Essential hypertension/hypotension: hypotension resolved Much better today. Resume BB . Continue to hold lasix.   OHS/OSA - Continue nightly CPAP.   Morbid obesity/Body mass index is 86.51 kg/m. -? Consider bariatric surgery consultation as outpatient.  Hyperlipidemia -  Continue Crestor  Type II DM - Apparently recently diagnosed an A1c said to be 6.9. Hold oral hypoglycemics.  CBG (last 3)   Recent Labs  06/29/16 2203 06/30/16 0755 06/30/16 1139  GLUCAP 144* 134* 120*    Resume ssi, while inpatient.  Hypothyroid - Continue levothyroxine.  GERD - PPI  Mild hyponatremia; Improved from yesterday.  - asymptomatic.   Acute on stage III Stage III chronic kidney disease -Baseline creatinine not entirely clear. Presented with creatinine of 1.21 increased to 1.9 over the next 48 hours. Discontinued Lasix and briefly hydrated with IV fluids. Creatinine has improved to 1.2. Avoid nephrotoxins. - Acute kidney injury likely secondary to infection and Lasix. - would continue to hold lasix and monitor creatinine intermittently. - stopped the IV fluids.  - repeat creatinine tomorrow.  DVT prophylaxis: Lovenox Code Status: Full  Family Communication: Discussed with patient. No family at bedside. Disposition Plan: DC home when clinically improved   Consultants:   Infectious disease  Dr Tommy Medal  Procedures:   Vascular Ultrasound 06/26/16 Left lower extremity venous duplex has been completed.  Preliminary findings:  Technically limited due to patient body habitus.  Not all veins were visualized. Of visualized vein segments, no DVT is noted.  Antimicrobials:   IV clindamycin 06/25/16 > 7/31  IV cefazolin 7/31 >   Subjective: Changed her pain meds to vicodin.   Objective:  Vitals:   06/29/16 1245 06/29/16 2036 06/30/16 0500 06/30/16 1341  BP: 139/78 138/77 (!) 155/76 (!) 114/53  Pulse: 62 61 (!) 59 61  Resp: 18 (!) 25 (!) 22 (!) 29  Temp: 98.2 F (36.8 C) 98.4 F (36.9 C) 98.8 F (37.1 C) 97.4 F (36.3 C)  TempSrc: Oral Oral Oral Oral  SpO2: 93% 92% 97% 97%  Weight:      Height:        Intake/Output Summary (Last 24 hours) at 06/30/16 1700 Last data filed at 06/30/16 1341  Gross per 24 hour  Intake              916 ml    Output              750 ml  Net              166 ml   Filed Weights   06/25/16 1958 06/26/16 0032  Weight: (!) 215 kg (474 lb) (!) 214.6 kg (473 lb)    Examination:  General exam:  morbidly obese female sitting up comfortably in bed in no distress. Respiratory system: diminished at bases Respiratory effort normal,no wheezing or rhonchi Cardiovascular system: S1 & S2 heard, RRR. No JVD, murmurs, rubs, gallops or clicks.  Gastrointestinal system: Abdomen is nondistended, soft and nontender. Organomegaly cannot be appreciated due to body habitus Normal bowel sounds heard. Central nervous system: Alert and oriented. No focal neurological deficits. Extremities: Symmetric 5 x 5 power. Diffuse circumferential erythema of left leg with associated increased warmth and tenderness, improving, when compared to y esterday.  Skin: No rashes, lesions or ulcers Psychiatry: calm today.  Left leg 06/27/2016    Right Leg 06/27/2016     Data Reviewed: I have personally reviewed following labs and imaging studies  CBC:  Recent Labs Lab 06/25/16 1949 06/26/16 0311 06/27/16 0537 06/29/16 0457  WBC 13.8* 16.9* 11.5* 6.6  NEUTROABS 12.6* 15.7* 9.3*  --   HGB 14.5 13.1 12.6 12.0  HCT 46.8* 43.9 43.5 39.8  MCV 89.0 89.8 91.2 89.6  PLT 181 191 157 [id number]   Basic Metabolic Panel:  Recent Labs Lab 06/25/16 1949 06/26/16 0311 06/27/16 0537 06/28/16 0527 06/29/16 0457  NA 138 136 134* 132* 133*  K 4.0 4.5 4.7 4.7 4.6  CL 102 101 98* 99* 98*  CO2 27 27 28 24 29   GLUCOSE 124* 136* 111* 144* 113*  BUN 17 20 32* 35* 29*  CREATININE 1.21* 1.38* 1.90* 1.74* 1.21*  CALCIUM 9.3 8.4* 8.6* 8.4* 9.0   GFR: Estimated Creatinine Clearance: 117.6 mL/min (by C-G formula based on SCr of 1.21 mg/dL). Liver Function Tests:  Recent Labs Lab 06/25/16 1949 06/26/16 0311  AST 21 28  ALT 18 15  ALKPHOS 54 46  BILITOT 1.2 1.4*  PROT 7.1 6.4*  ALBUMIN 3.4* 3.0*   No results for input(s): LIPASE,  AMYLASE in the last 168 hours. No results for input(s): AMMONIA in the last 168 hours. Coagulation Profile: No results for input(s): INR, PROTIME in the last 168 hours. Cardiac Enzymes: No results for input(s): CKTOTAL, CKMB, CKMBINDEX, TROPONINI in the last 168 hours. BNP (last 3 results) No results for input(s): PROBNP in the last 8760 hours. HbA1C: No results for input(s): HGBA1C in the last 72 hours. CBG:  Recent Labs Lab 06/29/16 1232 06/29/16 1711 06/29/16 2203 06/30/16 0755 06/30/16 1139  GLUCAP 127* 152* 144* 134* 120*   Lipid Profile: No results for input(s): CHOL, HDL, LDLCALC, TRIG, CHOLHDL, LDLDIRECT in the last 72 hours. Thyroid Function Tests: No results for input(s): TSH, T4TOTAL, FREET4, T3FREE, THYROIDAB in the last 72 hours. Anemia Panel: No results for input(s): VITAMINB12, FOLATE, FERRITIN, TIBC, IRON, RETICCTPCT in the last 72 hours.  Sepsis Labs:  Recent Labs Lab 06/25/16 2009 06/25/16 2243 06/29/16 0457  LATICACIDVEN 3.64* 2.19* 0.9    Recent Results (from the past 240  hour(s))  Culture, blood (Routine x 2)     Status: None   Collection Time: 06/25/16  7:35 PM  Result Value Ref Range Status   Specimen Description BLOOD RIGHT ARM  Final   Special Requests BOTTLES DRAWN AEROBIC AND ANAEROBIC 5CC   Final   Culture NO GROWTH 5 DAYS  Final   Report Status 06/30/2016 FINAL  Final  Culture, blood (Routine x 2)     Status: None   Collection Time: 06/25/16  7:40 PM  Result Value Ref Range Status   Specimen Description BLOOD LEFT ARM  Final   Special Requests IN PEDIATRIC BOTTLE 4CC  Final   Culture NO GROWTH 5 DAYS  Final   Report Status 06/30/2016 FINAL  Final  Urine culture     Status: Abnormal   Collection Time: 06/25/16  8:05 PM  Result Value Ref Range Status   Specimen Description URINE, RANDOM  Final   Special Requests NONE  Final   Culture MULTIPLE SPECIES PRESENT, SUGGEST RECOLLECTION (A)  Final   Report Status 06/26/2016 FINAL  Final    MRSA PCR Screening     Status: None   Collection Time: 06/26/16 12:21 AM  Result Value Ref Range Status   MRSA by PCR NEGATIVE NEGATIVE Final    Comment:        The GeneXpert MRSA Assay (FDA approved for NASAL specimens only), is one component of a comprehensive MRSA colonization surveillance program. It is not intended to diagnose MRSA infection nor to guide or monitor treatment for MRSA infections.          Radiology Studies: No results found.      Scheduled Meds: .  ceFAZolin (ANCEF) IV  2 g Intravenous Q8H  . enoxaparin (LOVENOX) injection  100 mg Subcutaneous Q24H  . insulin aspart  0-5 Units Subcutaneous QHS  . insulin aspart  0-9 Units Subcutaneous TID WC  . levothyroxine  150 mcg Oral QAC breakfast  . metoprolol succinate  50 mg Oral Daily  . pantoprazole  40 mg Oral Daily  . rosuvastatin  10 mg Oral q1800   Continuous Infusions:    LOS: 5 days    Time spent: 30 minutes.    Hosie Poisson, MD [facility] Pager [phone]  If 7PM-7AM, please contact night-coverage [email or web address] Password [email or web address] 06/30/2016, 5:00 PM

## 2016-07-01 LAB — BASIC METABOLIC PANEL
ANION GAP: 9 (ref 5–15)
BUN: 18 mg/dL (ref 6–20)
CALCIUM: 8.9 mg/dL (ref 8.9–10.3)
CO2: 29 mmol/L (ref 22–32)
CREATININE: 0.83 mg/dL (ref 0.44–1.00)
Chloride: 99 mmol/L — ABNORMAL LOW (ref 101–111)
Glucose, Bld: 121 mg/dL — ABNORMAL HIGH (ref 65–99)
Potassium: 4 mmol/L (ref 3.5–5.1)
SODIUM: 137 mmol/L (ref 135–145)

## 2016-07-01 LAB — GLUCOSE, CAPILLARY
GLUCOSE-CAPILLARY: 103 mg/dL — AB (ref 65–99)
Glucose-Capillary: 116 mg/dL — ABNORMAL HIGH (ref 65–99)

## 2016-07-01 LAB — CBC
HCT: 40.1 % (ref 36.0–46.0)
HEMOGLOBIN: 12.1 g/dL (ref 12.0–15.0)
MCH: 27 pg (ref 26.0–34.0)
MCHC: 30.2 g/dL (ref 30.0–36.0)
MCV: 89.5 fL (ref 78.0–100.0)
PLATELETS: 215 10*3/uL (ref 150–400)
RBC: 4.48 MIL/uL (ref 3.87–5.11)
RDW: 16.7 % — ABNORMAL HIGH (ref 11.5–15.5)
WBC: 7.9 10*3/uL (ref 4.0–10.5)

## 2016-07-01 MED ORDER — FUROSEMIDE 20 MG PO TABS: 20.0000 mg | ORAL_TABLET | ORAL | 0 refills | Status: DC | PRN

## 2016-07-01 MED ORDER — PANTOPRAZOLE SODIUM 40 MG PO TBEC: 40.0000 mg | DELAYED_RELEASE_TABLET | Freq: Every day | ORAL | 0 refills | Status: DC

## 2016-07-01 MED ORDER — HYDROCODONE-ACETAMINOPHEN 5-325 MG PO TABS: 1.0000 | ORAL_TABLET | ORAL | 0 refills | Status: DC | PRN

## 2016-07-01 MED ORDER — AMOXICILLIN-POT CLAVULANATE 875-125 MG PO TABS
1.0000 | ORAL_TABLET | Freq: Two times a day (BID) | ORAL | 0 refills | Status: DC
Start: 2016-07-01 — End: 2016-08-26

## 2016-07-01 MED ORDER — METOPROLOL SUCCINATE ER 50 MG PO TB24: 50.0000 mg | ORAL_TABLET | Freq: Every day | ORAL | 0 refills | Status: DC

## 2016-07-01 NOTE — Progress Notes (Signed)
D/C papers gone over with pt. Prescription given to pt. Pt. States no questions/complaints. IV's taken out. Pt. States she will get dressed on her own.

## 2016-07-01 NOTE — Progress Notes (Signed)
Pt. Placed on CPAP for h/s, tolerating well. 

## 2016-07-04 NOTE — Discharge Summary (Signed)
Physician Discharge Summary  Alexandra Beck [id number] DOB: October 03, 1981 DOA: 06/25/2016  PCP: Mauricio Po, FNP  Admit date: 06/25/2016 Discharge date: 07/01/2016  Admitted From: Home Disposition: HOme  Recommendations for Outpatient Follow-up:  1. Follow up with PCP in 1-2 weeks 2. Please obtain BMP/CBC in one week 3. Please follow up on the following pending results:    Discharge Condition:stable.  CODE STATUS:full  Diet recommendation: Heart Healthy   Brief/Interim Summary: 35-year-old female with PMH of morbid obesity, dilated cardiomyopathy, chronic kidney disease, DM, HLD, HTN, hypothyroid, GERD, OHS/OSA on CPAP, recurrent lower extremity cellulitis complicating venous insufficiency, presented to [facility] ED on 06/25/16 with subacute onset of progressive left lower extremity erythema, pain and fever of 102F at home. No history of trauma. Admitted for recurrent cellulitis of left lower extremity. Infectious disease consulted.    Discharge Diagnoses:  Principal Problem:   Cellulitis of left lower extremity Active Problems:   Essential hypertension   OSA (obstructive sleep apnea)   Hyperlipidemia   Diabetes mellitus type 2 in obese (HCC)   Hypothyroidism   GERD (gastroesophageal reflux disease)   Acute kidney injury ([place]) Erysipelas/Cellulitis of left lower extremity, recurrent - Complicating venous insufficiency. States that she has seen vascular surgery in the past who have suggested laser ablation. - Left lower extremity venous Doppler: Limited/difficult study due to body habitus. Of visualized vein segments, no DVT is noted. - MRSA by PCR negative. Blood cultures 2: Negative to date. Leukocytosis improved. Lactic acid normalized.  - Since patient's cellulitis was not clinically improving, infectious disease was consulted on 7/31 and changed IV clindamycin to IV cefazolin. Left leg cellulitis improving, recommend elevating limb. Discharged on oral antibiotics to complete the  course.  - hiv negative.    Essential hypertension/hypotension: hypotension resolved Well controlled.   OHS/OSA - Continue nightly CPAP.   Morbid obesity/Body mass index is 86.51 kg/m. -? Consider bariatric surgery consultation as outpatient.  Hyperlipidemia - Continue Crestor  Type II DM - Apparently recently diagnosed an A1c said to be 6.9. Hold oral hypoglycemics.  CBG (last 3)   Recent Labs (last 2 labs)    Recent Labs  06/29/16 2203 06/30/16 0755 06/30/16 1139  GLUCAP 144* 134* 120*      Resume ssi, while inpatient. resume home meds on discharge Hypothyroid - Continue levothyroxine.  GERD - PPI  Mild hyponatremia; Improved from yesterday.  - asymptomatic.   Acute on stage III Stage III chronic kidney disease -Baseline creatinine not entirely clear. Presented with creatinine of 1.21 increased to 1.9 over the next 48 hours. Discontinued Lasix and briefly hydrated with IV fluids. Creatinine has improved to 1.2. Avoid nephrotoxins. - Acute kidney injury likely secondary to infection and Lasix. - normal on discharge.      Discharge Instructions  Discharge Instructions    Diet - low sodium heart healthy    Complete by:  As directed   Discharge instructions    Complete by:  As directed   Follow up with PCP in one week.       Medication List    TAKE these medications   amoxicillin-clavulanate 875-125 MG tablet Commonly known as:  AUGMENTIN Take 1 tablet by mouth 2 (two) times daily.   furosemide 20 MG tablet Commonly known as:  LASIX Take 1 tablet (20 mg total) by mouth as needed for edema. What changed:  See the new instructions.   HYDROcodone-acetaminophen 5-325 MG tablet Commonly known as:  NORCO/VICODIN Take 1-2 tablets by mouth every 4 (four)  hours as needed for moderate pain.   JARDIANCE 10 MG Tabs tablet Generic drug:  empagliflozin TAKE 1 TABLET BY MOUTH DAILY   levothyroxine 150 MCG tablet Commonly known as:   SYNTHROID, LEVOTHROID TAKE 1 TABLET BY MOUTH EVERY MORNING BEFORE BREAKFAST   metoprolol succinate 50 MG 24 hr tablet Commonly known as:  TOPROL-XL Take 1 tablet (50 mg total) by mouth daily. Take with or immediately following a meal. What changed:  See the new instructions.   pantoprazole 40 MG tablet Commonly known as:  PROTONIX Take 1 tablet (40 mg total) by mouth daily.   rosuvastatin 10 MG tablet Commonly known as:  CRESTOR TAKE 1 TABLET BY MOUTH EVERY DAY      Follow-up Information    Mauricio Po, FNP. Schedule an appointment as soon as possible for a visit today.   Specialty:  Family Medicine Contact information: [address] [place] [address] [phone]          Allergies  Allergen Reactions  . Aspirin     REACTION: throat swelling, hives  . Doxycycline Other (See Comments)    Abdominal pain  . Lisinopril Cough  . Niaspan [Niacin Er]     Caused flushing    Consultations:  Infectious disease.   Procedures/Studies:  No results found.    Subjective: No new complaints.  Discharge Exam: Vitals:   07/01/16 0617 07/01/16 1000  BP: 136/79 126/73  Pulse: (!) 57 (!) 57  Resp: 17   Temp: 98.9 F (37.2 C)    Vitals:   06/30/16 1341 06/30/16 2143 07/01/16 0617 07/01/16 1000  BP: (!) 114/53 118/61 136/79 126/73  Pulse: 61 61 (!) 57 (!) 57  Resp: (!) 29 18 17    Temp: 97.4 F (36.3 C) 98.9 F (37.2 C) 98.9 F (37.2 C)   TempSrc: Oral Oral Oral   SpO2: 97% 97% 93%   Weight:      Height:        General: Pt is alert, awake, not in acute distress Cardiovascular: RRR, S1/S2 +, no rubs, no gallops Respiratory: CTA bilaterally, no wheezing, no rhonchi Abdominal: Soft, NT, ND, bowel sounds + Extremities: bilateral 2 + edema.    The results of significant diagnostics from this hospitalization (including imaging, microbiology, ancillary and laboratory) are listed below for reference.     Microbiology: Recent Results (from the past 240  hour(s))  Culture, blood (Routine x 2)     Status: None   Collection Time: 06/25/16  7:35 PM  Result Value Ref Range Status   Specimen Description BLOOD RIGHT ARM  Final   Special Requests BOTTLES DRAWN AEROBIC AND ANAEROBIC 5CC   Final   Culture NO GROWTH 5 DAYS  Final   Report Status 06/30/2016 FINAL  Final  Culture, blood (Routine x 2)     Status: None   Collection Time: 06/25/16  7:40 PM  Result Value Ref Range Status   Specimen Description BLOOD LEFT ARM  Final   Special Requests IN PEDIATRIC BOTTLE 4CC  Final   Culture NO GROWTH 5 DAYS  Final   Report Status 06/30/2016 FINAL  Final  Urine culture     Status: Abnormal   Collection Time: 06/25/16  8:05 PM  Result Value Ref Range Status   Specimen Description URINE, RANDOM  Final   Special Requests NONE  Final   Culture MULTIPLE SPECIES PRESENT, SUGGEST RECOLLECTION (A)  Final   Report Status 06/26/2016 FINAL  Final  MRSA PCR Screening  Status: None   Collection Time: 06/26/16 12:21 AM  Result Value Ref Range Status   MRSA by PCR NEGATIVE NEGATIVE Final    Comment:        The GeneXpert MRSA Assay (FDA approved for NASAL specimens only), is one component of a comprehensive MRSA colonization surveillance program. It is not intended to diagnose MRSA infection nor to guide or monitor treatment for MRSA infections.      Labs: BNP (last 3 results) No results for input(s): BNP in the last 8760 hours. Basic Metabolic Panel:  Recent Labs Lab 06/27/16 0537 06/28/16 0527 06/29/16 0457 07/01/16 0445  NA 134* 132* 133* 137  K 4.7 4.7 4.6 4.0  CL 98* 99* 98* 99*  CO2 28 24 29 29   GLUCOSE 111* 144* 113* 121*  BUN 32* 35* 29* 18  CREATININE 1.90* 1.74* 1.21* 0.83  CALCIUM 8.6* 8.4* 9.0 8.9   Liver Function Tests: No results for input(s): AST, ALT, ALKPHOS, BILITOT, PROT, ALBUMIN in the last 168 hours. No results for input(s): LIPASE, AMYLASE in the last 168 hours. No results for input(s): AMMONIA in the last 168  hours. CBC:  Recent Labs Lab 06/27/16 0537 06/29/16 0457 07/01/16 0445  WBC 11.5* 6.6 7.9  NEUTROABS 9.3*  --   --   HGB 12.6 12.0 12.1  HCT 43.5 39.8 40.1  MCV 91.2 89.6 89.5  PLT 157 163 215   Cardiac Enzymes: No results for input(s): CKTOTAL, CKMB, CKMBINDEX, TROPONINI in the last 168 hours. BNP: Invalid input(s): POCBNP CBG:  Recent Labs Lab 06/30/16 1139 06/30/16 1704 06/30/16 2141 07/01/16 0742 07/01/16 1139  GLUCAP 120* 105* 115* 103* 116*   D-Dimer No results for input(s): DDIMER in the last 72 hours. Hgb A1c No results for input(s): HGBA1C in the last 72 hours. Lipid Profile No results for input(s): CHOL, HDL, LDLCALC, TRIG, CHOLHDL, LDLDIRECT in the last 72 hours. Thyroid function studies No results for input(s): TSH, T4TOTAL, T3FREE, THYROIDAB in the last 72 hours.  Invalid input(s): FREET3 Anemia work up No results for input(s): VITAMINB12, FOLATE, FERRITIN, TIBC, IRON, RETICCTPCT in the last 72 hours. Urinalysis    Component Value Date/Time   COLORURINE YELLOW 06/25/2016 2005   APPEARANCEUR CLEAR 06/25/2016 2005   LABSPEC 1.018 06/25/2016 2005   PHURINE 5.5 06/25/2016 2005   GLUCOSEU >1000 (A) 06/25/2016 2005   HGBUR SMALL (A) 06/25/2016 2005   BILIRUBINUR NEGATIVE 06/25/2016 2005   KETONESUR NEGATIVE 06/25/2016 2005   PROTEINUR 100 (A) 06/25/2016 2005   UROBILINOGEN 0.2 07/25/2014 1040   NITRITE NEGATIVE 06/25/2016 2005   LEUKOCYTESUR NEGATIVE 06/25/2016 2005   Sepsis Labs Invalid input(s): PROCALCITONIN,  WBC,  LACTICIDVEN Microbiology Recent Results (from the past 240 hour(s))  Culture, blood (Routine x 2)     Status: None   Collection Time: 06/25/16  7:35 PM  Result Value Ref Range Status   Specimen Description BLOOD RIGHT ARM  Final   Special Requests BOTTLES DRAWN AEROBIC AND ANAEROBIC 5CC   Final   Culture NO GROWTH 5 DAYS  Final   Report Status 06/30/2016 FINAL  Final  Culture, blood (Routine x 2)     Status: None   Collection  Time: 06/25/16  7:40 PM  Result Value Ref Range Status   Specimen Description BLOOD LEFT ARM  Final   Special Requests IN PEDIATRIC BOTTLE 4CC  Final   Culture NO GROWTH 5 DAYS  Final   Report Status 06/30/2016 FINAL  Final  Urine culture  Status: Abnormal   Collection Time: 06/25/16  8:05 PM  Result Value Ref Range Status   Specimen Description URINE, RANDOM  Final   Special Requests NONE  Final   Culture MULTIPLE SPECIES PRESENT, SUGGEST RECOLLECTION (A)  Final   Report Status 06/26/2016 FINAL  Final  MRSA PCR Screening     Status: None   Collection Time: 06/26/16 12:21 AM  Result Value Ref Range Status   MRSA by PCR NEGATIVE NEGATIVE Final    Comment:        The GeneXpert MRSA Assay (FDA approved for NASAL specimens only), is one component of a comprehensive MRSA colonization surveillance program. It is not intended to diagnose MRSA infection nor to guide or monitor treatment for MRSA infections.      Time coordinating discharge: Over 30 minutes  SIGNED:   Hosie Poisson, MD  [facility] 07/04/2016, 12:12 AM Pager   If 7PM-7AM, please contact night-coverage [email or web address] Password [email or web address]

## 2016-07-05 ENCOUNTER — Ambulatory Visit (INDEPENDENT_AMBULATORY_CARE_PROVIDER_SITE_OTHER): Payer: BC Managed Care – PPO | Admitting: Family

## 2016-07-05 ENCOUNTER — Encounter: Payer: Self-pay | Admitting: Family

## 2016-07-05 VITALS — BP 160/98 | HR 63 | Temp 98.2°F | Resp 22 | Ht 62.0 in | Wt >= 6400 oz

## 2016-07-05 DIAGNOSIS — N179 Acute kidney failure, unspecified: Secondary | ICD-10-CM

## 2016-07-05 DIAGNOSIS — E669 Obesity, unspecified: Secondary | ICD-10-CM | POA: Diagnosis not present

## 2016-07-05 DIAGNOSIS — L03116 Cellulitis of left lower limb: Secondary | ICD-10-CM | POA: Diagnosis not present

## 2016-07-05 DIAGNOSIS — E1169 Type 2 diabetes mellitus with other specified complication: Secondary | ICD-10-CM

## 2016-07-05 DIAGNOSIS — E119 Type 2 diabetes mellitus without complications: Secondary | ICD-10-CM | POA: Diagnosis not present

## 2016-07-05 NOTE — Progress Notes (Signed)
Subjective:    Patient ID: Alexandra Beck, female    DOB: [birth year], 35 y.o.   MRN: [id number]  Chief Complaint  Patient presents with  . Hospitalization Follow-up    Cellulitis, gained weight     HPI:  Alexandra Beck is a 35 y.o. female who  has a past medical history of Anemia; CARDIOMYOPATHY, DILATED; Chronic kidney disease; Depression; Diabetes mellitus ([place]); GERD (gastroesophageal reflux disease); Hyperlipidemia; Hypertension; Hypothyroidism; Morbid obesity ([place]); Morbid obesity with BMI of 70 and over, adult ([place]); Obesity hypoventilation syndrome ([place]); OSA (obstructive sleep apnea); Recurrent cellulitis of lower leg; Sepsis ([place]) (11/2012); and Venous insufficiency of leg. and presents today for a follow up office visit.  This is a new problem. Recently evaluated in the emergency department and admitted to the hospital for increasing temperature and leg infection. Physical exam showed warm, red area to the left lower extremity with no abscess or crepitus noted. Sepsis labs were initiated with no hypotension and lactate was below 4. She was resuscitated 1 L of normal saline and started on clindamycin for suspected cellulitis of lower extremity. Her comprehensive metabolic panel showed mildly elevated creatinine from baseline and an elevated white blood cell count of 13.8. She was diagnosed with your Erysipelas/cellulitis of the left lower extremity complicated by venous insufficiency. There was no DVT noted. Her PCR for MRSA was negative as well as blood cultures being negative as well. Leukocytosis improved. Infectious disease was consult and on 7/31 and change clindamycin to cefazolin. Acute kidney injury was most likely related to infection and Lasix. It was normalized at discharge. Follow-up was recommended with primary care in 1-2 weeks to obtain a basic metabolic profile and CBC. All hospital records, imaging, and lab work was reviewed in detail.  Since leaving the hospital she reports  no further fevers. She continues to wear her compression hose and continued medications and has followed up with Dr. Erlinda Hong of [place] with the recommendations to keep her legs wrapped and elevated. Also recommended to stay out of work until follow up. Modifying factors include having her leg wrapped.   Allergies  Allergen Reactions  . Aspirin     REACTION: throat swelling, hives  . Doxycycline Other (See Comments)    Abdominal pain  . Lisinopril Cough  . Niaspan [Niacin Er]     Caused flushing     Current Outpatient Prescriptions on File Prior to Visit  Medication Sig Dispense Refill  . amoxicillin-clavulanate (AUGMENTIN) 875-125 MG tablet Take 1 tablet by mouth 2 (two) times daily. 10 tablet 0  . furosemide (LASIX) 20 MG tablet Take 1 tablet (20 mg total) by mouth as needed for edema. 30 tablet 0  . HYDROcodone-acetaminophen (NORCO/VICODIN) 5-325 MG tablet Take 1-2 tablets by mouth every 4 (four) hours as needed for moderate pain. 10 tablet 0  . levothyroxine (SYNTHROID, LEVOTHROID) 150 MCG tablet TAKE 1 TABLET BY MOUTH EVERY MORNING BEFORE BREAKFAST 30 tablet 0  . metoprolol succinate (TOPROL-XL) 50 MG 24 hr tablet Take 1 tablet (50 mg total) by mouth daily. Take with or immediately following a meal. 30 tablet 0  . rosuvastatin (CRESTOR) 10 MG tablet TAKE 1 TABLET BY MOUTH EVERY DAY 30 tablet 3   No current facility-administered medications on file prior to visit.      Past Surgical History:  Procedure Laterality Date  . WISDOM TOOTH EXTRACTION       Review of Systems  Constitutional: Negative for chills and fever.  Respiratory: Negative for  chest tightness and shortness of breath.   Cardiovascular: Positive for leg swelling. Negative for chest pain and palpitations.      Objective:    BP (!) 160/98 (BP Location: Left Arm, Patient Position: Sitting, Cuff Size: Large)   Pulse 63   Temp 98.2 F (36.8 C) (Oral)   Resp (!) 22   Ht 5\' 2"  (1.575 m)   Wt (!) 481 lb  (218.2 kg)   LMP 06/25/2016   SpO2 95%   BMI 87.98 kg/m  Nursing note and vital signs reviewed.   Physical Exam  Constitutional: She is oriented to person, place, and time. She appears well-developed and well-nourished. No distress.  Cardiovascular: Normal rate, regular rhythm, normal heart sounds and intact distal pulses.   Left leg remains with edema and mild tenderness. Wrapped in coban for compression. Distal pulses are intact and appropriate.   Pulmonary/Chest: Effort normal and breath sounds normal. She has no wheezes. She has no rales. She exhibits no tenderness.  Neurological: She is alert and oriented to person, place, and time.  Skin: Skin is warm and dry.  Psychiatric: She has a normal mood and affect. Her behavior is normal. Judgment and thought content normal.       Assessment & Plan:   Problem List Items Addressed This Visit      Endocrine   Diabetes mellitus type 2 in obese ([place])    Type 2 diabetes adequately controlled with A1c of 6.9. Given current situation with acute kidney injury and cellulitis, will hold off on additional treatments at this time. Encouraged to decrease processed/sugary food intake and follow a low carbohydrate diet. Complete diabetic prevention and upcoming office visits.        Genitourinary   Acute kidney injury ([place])    Symptoms of acute kidney injury appears resolving with most recent BUN and creatinine returning to baseline. Obtain basic metabolic profile to check current status. Continue to monitor kidney function with possible diuresis in the future to prevent further cellulitis episodes.      Relevant Orders   Basic Metabolic Panel (BMET)   CBC     Other   Cellulitis of left lower extremity - Primary    Cellulitis of the left lower extremity appears to be resolving as she completes her course of Augmentin. Recommend continued elevation while seated and compression wraps. Obtain CBC to check current WBC.  This is the 5-6th episode  of cellulitis in this leg within the last 7 years. Discussed with patient the continued likelihood of symptoms returning. Emphasized importance of weight loss and potential needed for FMLA. Continue to follow up with orthopedics for further evaluation. We did discuss the possibility of weight loss surgery to help decrease risk.       Relevant Orders   Basic Metabolic Panel (BMET)   CBC    Other Visit Diagnoses   None.      I have discontinued Ms. Bautz JARDIANCE and pantoprazole. I am also having her maintain her levothyroxine, rosuvastatin, furosemide, HYDROcodone-acetaminophen, metoprolol succinate, and amoxicillin-clavulanate.   Follow-up: Return in about 2 weeks (around 07/19/2016), or if symptoms worsen or fail to improve.  Mauricio Po, FNP   Note: 40 minutes of face-to-face contact with minutes of counseling regarding weight loss, plan of care, and treatment options.

## 2016-07-05 NOTE — Patient Instructions (Signed)
Thank you for choosing [facility].  Summary/Instructions:  Please continue to take your medications as prescribed.   Follow up with orthopedics as scheduled.  Please stop by the lab on the lower level of the building for your blood work. Your results will be released to [place] (or called to you) after review, usually within 72 hours after test completion. If any changes need to be made, you will be notified at that same time.  1. The lab is open from 7:30am to 5:30 pm Monday-Friday  2. No appointment is necessary  3. Fasting (if needed) is 6-8 hours after food and drink; black coffee  and water are okay   If your symptoms worsen or fail to improve, please contact our office for further instruction, or in case of emergency go directly to the emergency room at the closest medical facility.    Cellulitis Cellulitis is an infection of the skin and the tissue beneath it. The infected area is usually red and tender. Cellulitis occurs most often in the arms and lower legs.  CAUSES  Cellulitis is caused by bacteria that enter the skin through cracks or cuts in the skin. The most common types of bacteria that cause cellulitis are staphylococci and streptococci. SIGNS AND SYMPTOMS   Redness and warmth.  Swelling.  Tenderness or pain.  Fever. DIAGNOSIS  Your health care provider can usually determine what is wrong based on a physical exam. Blood tests may also be done. TREATMENT  Treatment usually involves taking an antibiotic medicine. HOME CARE INSTRUCTIONS   Take your antibiotic medicine as directed by your health care provider. Finish the antibiotic even if you start to feel better.  Keep the infected arm or leg elevated to reduce swelling.  Apply a warm cloth to the affected area up to 4 times per day to relieve pain.  Take medicines only as directed by your health care provider.  Keep all follow-up visits as directed by your health care provider. SEEK MEDICAL CARE IF:     You notice red streaks coming from the infected area.  Your red area gets larger or turns dark in color.  Your bone or joint underneath the infected area becomes painful after the skin has healed.  Your infection returns in the same area or another area.  You notice a swollen bump in the infected area.  You develop new symptoms.  You have a fever. SEEK IMMEDIATE MEDICAL CARE IF:   You feel very sleepy.  You develop vomiting or diarrhea.  You have a general ill feeling (malaise) with muscle aches and pains.   This information is not intended to replace advice given to you by your health care provider. Make sure you discuss any questions you have with your health care provider.   Document Released: 08/24/2005 Document Revised: 08/05/2015 Document Reviewed: 01/30/2012 Elsevier Interactive Patient Education [employer].

## 2016-07-05 NOTE — Assessment & Plan Note (Addendum)
Type 2 diabetes adequately controlled with A1c of 6.9. Given current situation with acute kidney injury and cellulitis, will hold off on additional treatments at this time. Encouraged to decrease processed/sugary food intake and follow a low carbohydrate diet. Complete diabetic prevention and upcoming office visits.

## 2016-07-05 NOTE — Assessment & Plan Note (Signed)
Cellulitis of the left lower extremity appears to be resolving as she completes her course of Augmentin. Recommend continued elevation while seated and compression wraps. Obtain CBC to check current WBC.  This is the 5-6th episode of cellulitis in this leg within the last 7 years. Discussed with patient the continued likelihood of symptoms returning. Emphasized importance of weight loss and potential needed for FMLA. Continue to follow up with orthopedics for further evaluation. We did discuss the possibility of weight loss surgery to help decrease risk.

## 2016-07-05 NOTE — Assessment & Plan Note (Signed)
Symptoms of acute kidney injury appears resolving with most recent BUN and creatinine returning to baseline. Obtain basic metabolic profile to check current status. Continue to monitor kidney function with possible diuresis in the future to prevent further cellulitis episodes.

## 2016-07-08 ENCOUNTER — Other Ambulatory Visit (INDEPENDENT_AMBULATORY_CARE_PROVIDER_SITE_OTHER): Payer: BC Managed Care – PPO

## 2016-07-08 DIAGNOSIS — L03116 Cellulitis of left lower limb: Secondary | ICD-10-CM

## 2016-07-08 DIAGNOSIS — N179 Acute kidney failure, unspecified: Secondary | ICD-10-CM

## 2016-07-08 LAB — BASIC METABOLIC PANEL
BUN: 11 mg/dL (ref 6–23)
CO2: 35 mEq/L — ABNORMAL HIGH (ref 19–32)
Calcium: 9.9 mg/dL (ref 8.4–10.5)
Chloride: 95 mEq/L — ABNORMAL LOW (ref 96–112)
Creatinine, Ser: 0.93 mg/dL (ref 0.40–1.20)
GFR: 72.46 mL/min (ref >60.00)
Glucose, Bld: 115 mg/dL — ABNORMAL HIGH (ref 70–99)
POTASSIUM: 4.3 meq/L (ref 3.5–5.1)
SODIUM: 139 meq/L (ref 135–145)

## 2016-07-08 LAB — CBC
HCT: 40.5 % (ref 36.0–46.0)
HEMOGLOBIN: 12.9 g/dL (ref 12.0–15.0)
MCHC: 32 g/dL (ref 30.0–36.0)
MCV: 82.3 fl (ref 78.0–100.0)
PLATELETS: 374 10*3/uL (ref 150.0–400.0)
RBC: 4.92 Mil/uL (ref 3.87–5.11)
RDW: 17.6 % — ABNORMAL HIGH (ref 11.5–15.5)
WBC: 9.1 10*3/uL (ref 4.0–10.5)

## 2016-07-10 ENCOUNTER — Encounter: Payer: Self-pay | Admitting: Family

## 2016-07-23 ENCOUNTER — Other Ambulatory Visit: Payer: Self-pay | Admitting: Family

## 2016-07-26 ENCOUNTER — Other Ambulatory Visit: Payer: Self-pay | Admitting: Family

## 2016-07-26 ENCOUNTER — Encounter: Payer: Self-pay | Admitting: Family

## 2016-07-29 MED ORDER — METOPROLOL SUCCINATE ER 50 MG PO TB24: ORAL_TABLET | ORAL | 0 refills | Status: DC

## 2016-08-20 ENCOUNTER — Other Ambulatory Visit: Payer: Self-pay | Admitting: Family

## 2016-08-21 ENCOUNTER — Other Ambulatory Visit: Payer: Self-pay | Admitting: Family

## 2016-08-21 DIAGNOSIS — E039 Hypothyroidism, unspecified: Secondary | ICD-10-CM

## 2016-08-24 ENCOUNTER — Other Ambulatory Visit: Payer: Self-pay | Admitting: Family

## 2016-08-24 ENCOUNTER — Encounter: Payer: Self-pay | Admitting: Family

## 2016-08-25 ENCOUNTER — Other Ambulatory Visit: Payer: Self-pay | Admitting: Family

## 2016-08-25 ENCOUNTER — Other Ambulatory Visit: Payer: Self-pay | Admitting: Emergency Medicine

## 2016-08-25 MED ORDER — FUROSEMIDE 20 MG PO TABS: ORAL_TABLET | ORAL | 2 refills | Status: DC

## 2016-08-25 MED ORDER — FLUCONAZOLE 150 MG PO TABS: 150.0000 mg | ORAL_TABLET | Freq: Once | ORAL | 0 refills | Status: AC

## 2016-08-26 ENCOUNTER — Encounter (HOSPITAL_COMMUNITY): Payer: Self-pay | Admitting: Emergency Medicine

## 2016-08-26 ENCOUNTER — Inpatient Hospital Stay (HOSPITAL_COMMUNITY)
Admission: EM | Admit: 2016-08-26 | Discharge: 2016-08-28 | DRG: 872 | Disposition: A | Discharge status: Home / Self Care | Payer: BC Managed Care – PPO | Attending: Family Medicine | Admitting: Family Medicine

## 2016-08-26 ENCOUNTER — Inpatient Hospital Stay (HOSPITAL_COMMUNITY): Payer: BC Managed Care – PPO

## 2016-08-26 DIAGNOSIS — Z8249 Family history of ischemic heart disease and other diseases of the circulatory system: Secondary | ICD-10-CM | POA: Diagnosis not present

## 2016-08-26 DIAGNOSIS — I130 Hypertensive heart and chronic kidney disease with heart failure and stage 1 through stage 4 chronic kidney disease, or unspecified chronic kidney disease: Secondary | ICD-10-CM | POA: Diagnosis present

## 2016-08-26 DIAGNOSIS — Z7984 Long term (current) use of oral hypoglycemic drugs: Secondary | ICD-10-CM | POA: Diagnosis not present

## 2016-08-26 DIAGNOSIS — Z23 Encounter for immunization: Secondary | ICD-10-CM | POA: Diagnosis not present

## 2016-08-26 DIAGNOSIS — L03115 Cellulitis of right lower limb: Secondary | ICD-10-CM | POA: Diagnosis present

## 2016-08-26 DIAGNOSIS — I5032 Chronic diastolic (congestive) heart failure: Secondary | ICD-10-CM | POA: Diagnosis present

## 2016-08-26 DIAGNOSIS — I872 Venous insufficiency (chronic) (peripheral): Secondary | ICD-10-CM | POA: Diagnosis present

## 2016-08-26 DIAGNOSIS — E119 Type 2 diabetes mellitus without complications: Secondary | ICD-10-CM

## 2016-08-26 DIAGNOSIS — N189 Chronic kidney disease, unspecified: Secondary | ICD-10-CM | POA: Diagnosis present

## 2016-08-26 DIAGNOSIS — I890 Lymphedema, not elsewhere classified: Secondary | ICD-10-CM | POA: Diagnosis present

## 2016-08-26 DIAGNOSIS — E1169 Type 2 diabetes mellitus with other specified complication: Secondary | ICD-10-CM

## 2016-08-26 DIAGNOSIS — K219 Gastro-esophageal reflux disease without esophagitis: Secondary | ICD-10-CM | POA: Diagnosis present

## 2016-08-26 DIAGNOSIS — E039 Hypothyroidism, unspecified: Secondary | ICD-10-CM | POA: Diagnosis present

## 2016-08-26 DIAGNOSIS — D649 Anemia, unspecified: Secondary | ICD-10-CM | POA: Diagnosis present

## 2016-08-26 DIAGNOSIS — E785 Hyperlipidemia, unspecified: Secondary | ICD-10-CM | POA: Diagnosis present

## 2016-08-26 DIAGNOSIS — Z6841 Body Mass Index (BMI) 40.0 and over, adult: Secondary | ICD-10-CM

## 2016-08-26 DIAGNOSIS — Z79899 Other long term (current) drug therapy: Secondary | ICD-10-CM

## 2016-08-26 DIAGNOSIS — L03119 Cellulitis of unspecified part of limb: Secondary | ICD-10-CM | POA: Diagnosis present

## 2016-08-26 DIAGNOSIS — F329 Major depressive disorder, single episode, unspecified: Secondary | ICD-10-CM | POA: Diagnosis present

## 2016-08-26 DIAGNOSIS — A419 Sepsis, unspecified organism: Principal | ICD-10-CM | POA: Diagnosis present

## 2016-08-26 DIAGNOSIS — E1122 Type 2 diabetes mellitus with diabetic chronic kidney disease: Secondary | ICD-10-CM | POA: Diagnosis present

## 2016-08-26 DIAGNOSIS — G4733 Obstructive sleep apnea (adult) (pediatric): Secondary | ICD-10-CM | POA: Diagnosis present

## 2016-08-26 DIAGNOSIS — I10 Essential (primary) hypertension: Secondary | ICD-10-CM | POA: Diagnosis present

## 2016-08-26 DIAGNOSIS — E669 Obesity, unspecified: Secondary | ICD-10-CM

## 2016-08-26 DIAGNOSIS — I152 Hypertension secondary to endocrine disorders: Secondary | ICD-10-CM | POA: Diagnosis present

## 2016-08-26 LAB — CBC WITH DIFFERENTIAL/PLATELET
BASOS PCT: 0 %
Basophils Absolute: 0 10*3/uL (ref 0.0–0.1)
EOS ABS: 0.1 10*3/uL (ref 0.0–0.7)
EOS PCT: 1 %
HCT: 47.9 % — ABNORMAL HIGH (ref 36.0–46.0)
HEMOGLOBIN: 14.5 g/dL (ref 12.0–15.0)
Lymphocytes Relative: 6 %
Lymphs Abs: 0.9 10*3/uL (ref 0.7–4.0)
MCH: 27.4 pg (ref 26.0–34.0)
MCHC: 30.3 g/dL (ref 30.0–36.0)
MCV: 90.4 fL (ref 78.0–100.0)
MONOS PCT: 6 %
Monocytes Absolute: 0.8 10*3/uL (ref 0.1–1.0)
Neutro Abs: 11.8 10*3/uL — ABNORMAL HIGH (ref 1.7–7.7)
Neutrophils Relative %: 87 %
PLATELETS: 222 10*3/uL (ref 150–400)
RBC: 5.3 MIL/uL — ABNORMAL HIGH (ref 3.87–5.11)
RDW: 17.9 % — ABNORMAL HIGH (ref 11.5–15.5)
WBC: 13.5 10*3/uL — AB (ref 4.0–10.5)

## 2016-08-26 LAB — URINALYSIS, ROUTINE W REFLEX MICROSCOPIC
BILIRUBIN URINE: NEGATIVE
Glucose, UA: NEGATIVE mg/dL
KETONES UR: NEGATIVE mg/dL
Leukocytes, UA: NEGATIVE
NITRITE: NEGATIVE
PROTEIN: 100 mg/dL — AB
SPECIFIC GRAVITY, URINE: 1.013 (ref 1.005–1.030)
pH: 5.5 (ref 5.0–8.0)

## 2016-08-26 LAB — COMPREHENSIVE METABOLIC PANEL
ALBUMIN: 3.7 g/dL (ref 3.5–5.0)
ALT: 20 U/L (ref 14–54)
AST: 21 U/L (ref 15–41)
Alkaline Phosphatase: 69 U/L (ref 38–126)
Anion gap: 8 (ref 5–15)
BILIRUBIN TOTAL: 0.9 mg/dL (ref 0.3–1.2)
BUN: 16 mg/dL (ref 6–20)
CHLORIDE: 102 mmol/L (ref 101–111)
CO2: 29 mmol/L (ref 22–32)
CREATININE: 0.94 mg/dL (ref 0.44–1.00)
Calcium: 9.6 mg/dL (ref 8.9–10.3)
GFR calc Af Amer: >60 mL/min (ref >60)
GLUCOSE: 150 mg/dL — AB (ref 65–99)
Potassium: 3.9 mmol/L (ref 3.5–5.1)
Sodium: 139 mmol/L (ref 135–145)
TOTAL PROTEIN: 8 g/dL (ref 6.5–8.1)

## 2016-08-26 LAB — I-STAT BETA HCG BLOOD, ED (MC, WL, AP ONLY): I-stat hCG, quantitative: <5 m[IU]/mL (ref <5)

## 2016-08-26 LAB — I-STAT CG4 LACTIC ACID, ED
LACTIC ACID, VENOUS: 2.76 mmol/L — AB (ref 0.5–1.9)
LACTIC ACID, VENOUS: 2.25 mmol/L — AB (ref 0.5–1.9)

## 2016-08-26 LAB — URINE MICROSCOPIC-ADD ON
Bacteria, UA: NONE SEEN
WBC, UA: NONE SEEN WBC/hpf (ref 0–5)

## 2016-08-26 LAB — GLUCOSE, CAPILLARY
GLUCOSE-CAPILLARY: 125 mg/dL — AB (ref 65–99)
Glucose-Capillary: 148 mg/dL — ABNORMAL HIGH (ref 65–99)
Glucose-Capillary: 159 mg/dL — ABNORMAL HIGH (ref 65–99)
Glucose-Capillary: 115 mg/dL — ABNORMAL HIGH (ref 65–99)

## 2016-08-26 LAB — LACTIC ACID, PLASMA
LACTIC ACID, VENOUS: 2.7 mmol/L — AB (ref 0.5–1.9)
LACTIC ACID, VENOUS: 1.7 mmol/L (ref 0.5–1.9)

## 2016-08-26 MED ORDER — MORPHINE SULFATE (PF) 4 MG/ML IV SOLN
4.0000 mg | Freq: Once | INTRAVENOUS | Status: AC
  Administered 2016-08-26: 4 mg via INTRAVENOUS
  Filled 2016-08-26: qty 1

## 2016-08-26 MED ORDER — ACETAMINOPHEN 325 MG PO TABS
ORAL_TABLET | ORAL | Status: AC
  Filled 2016-08-26: qty 2

## 2016-08-26 MED ORDER — PIPERACILLIN-TAZOBACTAM 3.375 G IVPB 30 MIN
3.3750 g | Freq: Once | INTRAVENOUS | Status: AC
  Administered 2016-08-26: 3.375 g via INTRAVENOUS
  Filled 2016-08-26: qty 50

## 2016-08-26 MED ORDER — SODIUM CHLORIDE 0.9 % IV BOLUS (SEPSIS)
2000.0000 mL | Freq: Once | INTRAVENOUS | Status: AC
  Administered 2016-08-26: 2000 mL via INTRAVENOUS

## 2016-08-26 MED ORDER — VANCOMYCIN HCL 10 G IV SOLR
1500.0000 mg | Freq: Three times a day (TID) | INTRAVENOUS | Status: DC
  Filled 2016-08-26: qty 1500

## 2016-08-26 MED ORDER — VANCOMYCIN HCL 10 G IV SOLR
2000.0000 mg | Freq: Once | INTRAVENOUS | Status: AC
  Administered 2016-08-26: 2000 mg via INTRAVENOUS
  Filled 2016-08-26: qty 2000

## 2016-08-26 MED ORDER — VANCOMYCIN HCL IN DEXTROSE 1-5 GM/200ML-% IV SOLN: 1000.0000 mg | Freq: Once | INTRAVENOUS | Status: DC

## 2016-08-26 MED ORDER — METOPROLOL SUCCINATE ER 25 MG PO TB24
50.0000 mg | ORAL_TABLET | Freq: Every day | ORAL | Status: DC
  Administered 2016-08-26 – 2016-08-28 (×3): 50 mg via ORAL
  Filled 2016-08-26 (×3): qty 2

## 2016-08-26 MED ORDER — SENNOSIDES-DOCUSATE SODIUM 8.6-50 MG PO TABS: 1.0000 | ORAL_TABLET | Freq: Every evening | ORAL | Status: DC | PRN

## 2016-08-26 MED ORDER — ACETAMINOPHEN 650 MG RE SUPP: 650.0000 mg | Freq: Four times a day (QID) | RECTAL | Status: DC | PRN

## 2016-08-26 MED ORDER — INSULIN ASPART 100 UNIT/ML ~~LOC~~ SOLN
0.0000 [IU] | Freq: Three times a day (TID) | SUBCUTANEOUS | Status: DC
  Administered 2016-08-26: 2 [IU] via SUBCUTANEOUS
  Administered 2016-08-27: 3 [IU] via SUBCUTANEOUS
  Administered 2016-08-27: 2 [IU] via SUBCUTANEOUS

## 2016-08-26 MED ORDER — HYDROCODONE-ACETAMINOPHEN 5-325 MG PO TABS
1.0000 | ORAL_TABLET | ORAL | Status: DC | PRN
  Administered 2016-08-26 – 2016-08-27 (×4): 2 via ORAL
  Filled 2016-08-26 (×4): qty 2

## 2016-08-26 MED ORDER — ROSUVASTATIN CALCIUM 10 MG PO TABS
10.0000 mg | ORAL_TABLET | Freq: Every day | ORAL | Status: DC
  Administered 2016-08-26 – 2016-08-28 (×3): 10 mg via ORAL
  Filled 2016-08-26 (×3): qty 1

## 2016-08-26 MED ORDER — SODIUM CHLORIDE 0.9 % IV SOLN
INTRAVENOUS | Status: DC
  Administered 2016-08-26: 100 mL/h via INTRAVENOUS
  Administered 2016-08-27 – 2016-08-28 (×2): via INTRAVENOUS

## 2016-08-26 MED ORDER — ACETAMINOPHEN 325 MG PO TABS
650.0000 mg | ORAL_TABLET | Freq: Once | ORAL | Status: AC
  Administered 2016-08-26: 650 mg via ORAL

## 2016-08-26 MED ORDER — HEPARIN SODIUM (PORCINE) 5000 UNIT/ML IJ SOLN
5000.0000 [IU] | Freq: Three times a day (TID) | INTRAMUSCULAR | Status: DC
  Administered 2016-08-26 – 2016-08-28 (×6): 5000 [IU] via SUBCUTANEOUS
  Filled 2016-08-26 (×6): qty 1

## 2016-08-26 MED ORDER — PIPERACILLIN-TAZOBACTAM 3.375 G IVPB
3.3750 g | Freq: Three times a day (TID) | INTRAVENOUS | Status: DC
  Administered 2016-08-26 – 2016-08-28 (×6): 3.375 g via INTRAVENOUS
  Filled 2016-08-26 (×8): qty 50

## 2016-08-26 MED ORDER — ONDANSETRON HCL 4 MG/2ML IJ SOLN: 4.0000 mg | Freq: Four times a day (QID) | INTRAMUSCULAR | Status: DC | PRN

## 2016-08-26 MED ORDER — LEVOTHYROXINE SODIUM 75 MCG PO TABS
150.0000 ug | ORAL_TABLET | Freq: Every day | ORAL | Status: DC
  Administered 2016-08-26 – 2016-08-28 (×3): 150 ug via ORAL
  Filled 2016-08-26 (×3): qty 2

## 2016-08-26 MED ORDER — ACETAMINOPHEN 325 MG PO TABS
650.0000 mg | ORAL_TABLET | Freq: Four times a day (QID) | ORAL | Status: DC | PRN
  Administered 2016-08-26 – 2016-08-28 (×2): 650 mg via ORAL
  Filled 2016-08-26 (×3): qty 2

## 2016-08-26 MED ORDER — VANCOMYCIN HCL 10 G IV SOLR
1500.0000 mg | Freq: Three times a day (TID) | INTRAVENOUS | Status: DC
  Administered 2016-08-26 (×2): 1500 mg via INTRAVENOUS
  Filled 2016-08-26 (×4): qty 1500

## 2016-08-26 MED ORDER — ONDANSETRON HCL 4 MG PO TABS
4.0000 mg | ORAL_TABLET | Freq: Four times a day (QID) | ORAL | Status: DC | PRN
  Administered 2016-08-27: 4 mg via ORAL
  Filled 2016-08-26: qty 1

## 2016-08-26 MED ORDER — ONDANSETRON HCL 4 MG/2ML IJ SOLN
4.0000 mg | Freq: Once | INTRAMUSCULAR | Status: AC
  Administered 2016-08-26: 4 mg via INTRAVENOUS
  Filled 2016-08-26: qty 2

## 2016-08-26 MED ORDER — INSULIN ASPART 100 UNIT/ML ~~LOC~~ SOLN
0.0000 [IU] | Freq: Every day | SUBCUTANEOUS | Status: DC
Start: 2016-08-26 — End: 2016-08-28

## 2016-08-26 NOTE — Progress Notes (Signed)
Patient seen and evaluated earlier today by my associate. Plan will be to continue current antibiotic regimen.  Gen: pt in nad, alert and awake CV: no cyanosis Pulm: equal chest rise, no increased wob  Will reassess next am.  Velvet Bathe

## 2016-08-26 NOTE — ED Provider Notes (Signed)
[facility] DEPT Provider Note   CSN: [id number] Arrival date & time: 08/26/16  [id number]  By signing my name below, I, Alexandra Beck. [name], attest that this documentation has been prepared under the direction and in the presence of Blanchie Dessert, MD.  Electronically Signed: Maud Beck. [name], ED Scribe. 08/26/16. 3:33 AM.    History   Chief Complaint Chief Complaint  Patient presents with  . Cellulitis  . Hypertension   HPI  HPI Comments: Alexandra Beck is a 35 y.o. female with a PMHx of cardiomyopathy, CKD, DM, hyperlipidemia, and HTN who presents to the Emergency Department complaining of recurrent, worsening redness to the L lower extremity with associated pain onset just prior to arrival. Pt also reports a fever of 101.9 and pain to the groin. No aggravating or alleviating factors at this time. No recent chills, nausea, vomiting, abdominal pain, or cough. Pt was recently admitted to the hospital on 06/25/16 for recurrent cellulitis and discharged on 07/01/16. Pt states she recently completed a 2 month course of antibiotics at the beginning of September. She does not wear oxygen at home.  PCP: Mauricio Po, FNP    Past Medical History:  Diagnosis Date  . Anemia   . CARDIOMYOPATHY, DILATED   . Chronic kidney disease   . Depression   . Diabetes mellitus ([place])   . GERD (gastroesophageal reflux disease)   . Hyperlipidemia   . Hypertension   . Hypothyroidism   . Morbid obesity ([place])   . Morbid obesity with BMI of 70 and over, adult ([place])   . Obesity hypoventilation syndrome ([place])   . OSA (obstructive sleep apnea)   . Recurrent cellulitis of lower leg    LLE, venous insuff  . Sepsis ([place]) 11/2012   Secondary to cellulitis  . Venous insufficiency of leg     Patient Active Problem List   Diagnosis Date Noted  . Acute kidney injury ([place])   . Cellulitis of left lower extremity 06/25/2016  . Subcutaneous cyst 10/02/2015  . Left wrist pain 09/16/2015  . Leg edema, left 06/10/2015   . Cough 10/15/2014  . Dyspnea 08/05/2014  . Hypoxia 07/23/2014  . Sepsis ([place]) 07/23/2014  . CHF (congestive heart failure) ([place])   . Hyperlipidemia   . Diabetes mellitus type 2 in obese ([place])   . Hypothyroidism   . GERD (gastroesophageal reflux disease)   . Depression   . Venous insufficiency of leg   . Normocytic anemia 01/08/2013  . Recurrent cellulitis of lower leg 12/25/2012  . OSA (obstructive sleep apnea) 12/25/2012  . Obesity hypoventilation syndrome ([place]) 12/25/2012  . Morbid obesity with BMI of 70 and over, adult ([place]) 12/24/2012  . Essential hypertension 06/26/2007    Past Surgical History:  Procedure Laterality Date  . WISDOM TOOTH EXTRACTION      OB History    No data available       Home Medications    Prior to Admission medications   Medication Sig Start Date End Date Taking? Authorizing Provider  amoxicillin-clavulanate (AUGMENTIN) 875-125 MG tablet Take 1 tablet by mouth 2 (two) times daily. 07/01/16   Hosie Poisson, MD  furosemide (LASIX) 20 MG tablet TAKE 1 TO 2 TABLETS(20 TO 40 MG) BY MOUTH DAILY AS NEEDED FOR SWELLING 08/25/16   Golden Circle, FNP  HYDROcodone-acetaminophen (NORCO/VICODIN) 5-325 MG tablet Take 1-2 tablets by mouth every 4 (four) hours as needed for moderate pain. 07/01/16   Hosie Poisson, MD  JARDIANCE 10 MG TABS tablet TAKE 1 TABLET  BY MOUTH DAILY 08/22/16   Golden Circle, FNP  levothyroxine (SYNTHROID, LEVOTHROID) 150 MCG tablet TAKE 1 TABLET BY MOUTH EVERY MORNING BEFORE BREAKFAST 08/23/16   Golden Circle, FNP  metoprolol succinate (TOPROL-XL) 50 MG 24 hr tablet TAKE 1 TABLET BY MOUTH EVERY DAY WITH OR FOLLOWING A MEAL 07/29/16   Golden Circle, FNP  rosuvastatin (CRESTOR) 10 MG tablet TAKE 1 TABLET BY MOUTH EVERY DAY 08/23/16   Golden Circle, FNP    Family History Family History  Problem Relation Age of Onset  . Hypertension Mother   . Hypertension Father   . Heart disease Father     before age 68    Social  History Social History  Substance Use Topics  . Smoking status: Former Smoker    Years: 0.00  . Smokeless tobacco: Never Used  . Alcohol use No     Allergies   Aspirin; Doxycycline; Lisinopril; and Niaspan [niacin er]   Review of Systems Review of Systems  Constitutional: Positive for fever. Negative for chills.  Respiratory: Positive for shortness of breath.   Cardiovascular: Negative for chest pain.  Gastrointestinal: Negative for abdominal distention, nausea and vomiting.  Skin: Positive for rash.  All other systems reviewed and are negative.    Physical Exam Updated Vital Signs BP 199/96 (BP Location: Right Wrist)   Pulse 110   Temp (!) 103.1 F (39.5 C) (Oral)   Resp 24   Wt (!) 462 lb 11.2 oz (209.9 kg)   LMP 08/12/2016 (Approximate)   SpO2 95%   BMI 84.63 kg/m   Physical Exam  Constitutional: She is oriented to person, place, and time. She appears well-developed and well-nourished. No distress.  Morbidly obese   HENT:  Head: Normocephalic and atraumatic.  Eyes: EOM are normal.  Neck: Normal range of motion.  Cardiovascular: Regular rhythm and normal heart sounds.  Tachycardia present.   Pulses:      Dorsalis pedis pulses are 2+ on the right side, and 2+ on the left side.  Capillary refill less than 3 seconds  Pulmonary/Chest: Effort normal and breath sounds normal.  Abdominal: Soft. She exhibits no distension. There is no tenderness.  Genitourinary:  Genitourinary Comments: Mild tenderness to groin without induration or fluctuance.  Musculoskeletal: Normal range of motion.  Neurological: She is alert and oriented to person, place, and time.  Skin: Skin is warm and dry.  Psychiatric: She has a normal mood and affect. Judgment normal.  Nursing note and vitals reviewed.    ED Treatments / Results   DIAGNOSTIC STUDIES: Oxygen Saturation is 95% on RA, adequate by my interpretation.    COORDINATION OF CARE: 3:27 AM- Will order blood work and  urinalysis. Will give Tylenol. Discussed treatment plan with pt at bedside and pt agreed to plan.     Labs (all labs ordered are listed, but only abnormal results are displayed) Labs Reviewed  COMPREHENSIVE METABOLIC PANEL - Abnormal; Notable for the following:       Result Value   Glucose, Bld 150 (*)    All other components within normal limits  CBC WITH DIFFERENTIAL/PLATELET - Abnormal; Notable for the following:    WBC 13.5 (*)    RBC 5.30 (*)    HCT 47.9 (*)    RDW 17.9 (*)    Neutro Abs 11.8 (*)    All other components within normal limits  I-STAT CG4 LACTIC ACID, ED - Abnormal; Notable for the following:    Lactic Acid, Venous 2.76 (*)  All other components within normal limits  CULTURE, BLOOD (ROUTINE X 2)  CULTURE, BLOOD (ROUTINE X 2)  URINE CULTURE  URINALYSIS, ROUTINE W REFLEX MICROSCOPIC (NOT AT [facility])  I-STAT BETA HCG BLOOD, ED ([facility], [facility], [facility] ONLY)    EKG  EKG Interpretation None       Radiology No results found.  Procedures Procedures (including critical care time)  Medications Ordered in ED Medications  acetaminophen (TYLENOL) 325 MG tablet (not administered)  piperacillin-tazobactam (ZOSYN) IVPB 3.375 g (not administered)  vancomycin (VANCOCIN) 2,000 mg in sodium chloride 0.9 % 500 mL IVPB (not administered)  acetaminophen (TYLENOL) tablet 650 mg (650 mg Oral Given 08/26/16 0247)  sodium chloride 0.9 % bolus 2,000 mL (2,000 mLs Intravenous New Bag/Given 08/26/16 0353)     Initial Impression / Assessment and Plan / ED Course  I have reviewed the triage vital signs and the nursing notes.  Pertinent labs & imaging results that were available during my care of the patient were reviewed by me and considered in my medical decision making (see chart for details).  Clinical Course   Patient is a 35 year old female with morbid obesity presenting today with fever and concern for developing cellulitis. Patient was febrile to 103 with a lactic acid of 2.76 and  a code sepsis was initiated. Patient has a mild leukocytosis of 13,000 and normal CMP. On exam patient has mild tenderness to the right leg but no significant erythema. No findings in the groin and no suspicion for necrotizing fasciitis. Patient denies any urinary symptoms, abdominal pain or respiratory symptoms. Patient was initially given 2 L of fluid and Tylenol. Blood pressure has remained normal. Patient was started on Zosyn and vancomycin per protocol. Will admit patient for further care.  Final Clinical Impressions(s) / ED Diagnoses   Final diagnoses:  Cellulitis of right lower extremity  Sepsis, due to unspecified organism [facility])    New Prescriptions New Prescriptions   No medications on file  \ I personally performed the services described in this documentation, which was scribed in my presence.  The recorded information has been reviewed and considered.    Blanchie Dessert, MD 08/26/16 [id number]

## 2016-08-26 NOTE — Progress Notes (Signed)
Pharmacy Antibiotic Note  Alexandra Beck is a 35 y.o. female admitted on 08/26/2016 with cellulitis.  Pharmacy has been consulted for Vancomycin/Zosyn dosing. WBC mildly elevated, lactic acid elevated, renal function good.   Plan: -Vancomycin 2000 mg IV x 1, then 1500 mg IV q8h -Zosyn 3.375G IV q8h to be infused over 4 hours -Trend WBC, temp, renal function  -Drug levels ASAP at steady state   Weight: (!) 462 lb 11.2 oz (209.9 kg)  Temp (24hrs), Avg:103.1 F (39.5 C), Min:103.1 F (39.5 C), Max:103.1 F (39.5 C)   Recent Labs Lab 08/26/16 0245 08/26/16 0311  WBC 13.5*  --   CREATININE 0.94  --   LATICACIDVEN  --  2.76*    Estimated Creatinine Clearance: 148.9 mL/min (by C-G formula based on SCr of 0.94 mg/dL).    Allergies  Allergen Reactions  . Aspirin     REACTION: throat swelling, hives  . Doxycycline Other (See Comments)    Abdominal pain  . Lisinopril Cough  . Niaspan [Niacin Er]     Caused flushing    Narda Bonds 08/26/2016 4:43 AM

## 2016-08-26 NOTE — ED Triage Notes (Signed)
Pt. reports cellulitis at right lower leg with swelling , fever and right groin pain onset this evening , she is hypertensive at triage .

## 2016-08-26 NOTE — ED Notes (Signed)
Pt makes specific request that she NOT have a bariatric bed during this inpatient admission. Says she had one the last time she was hospitalized and it was very uncomfortable.

## 2016-08-26 NOTE — H&P (Signed)
History and Physical  Patient Name: Alexandra Beck     [id number]    DOB: September 21, 1981    DOA: 08/26/2016 PCP: Mauricio Po, FNP   Patient coming from: Home  Chief Complaint: Leg pain  HPI: Alexandra Beck is a 35 y.o. female with a past medical history significant for MO, chronic venous insufficiency and lymphedema complicated by recurrent cellulitis, OSA on CPAP, diastolic CHF, NIDDM, hypothyroidism and HTN who presents with fever and leg pain.  The patient was just admitted for 5 days clear this summer for cellulitis. She was treated with clindamycin, did not improve, was switched to cefazolin, and improved after 5 days was discharged on Augmentin. She got her Unna boot for about a week, her leg swelling improved dramatically, and she has been fine since then until one day ago.  Yesterday she woke up with new pain in the back of her right calf. During the course of the day, the pain spread, until the whole right leg was painful, and she had fever and malaise and so she came to the ER.  ED course: -Fever to 103.1, heart rate 110s, respirations 20, pulse oximetry high 80s, hypertensive -Na 139, K 3.9, Cr 0.94 (baseline 0.9), WBC 13.5K, Hgb 14.5 -Lactic acid 2.76 -Urine pregnancy negative -Urinalysis without pyuria, bacteria, or hematuria -Blood cultures were obtained, fluids were administered, and she was given vancomycin and Zosyn and TRH were asked to admit for sepsis presumed cellulitis   She has been admitted numerous times over the years for cellulitis. This is usually in the left leg, which has worsened venous insufficiency than the right. In 2014, she was admitted with cellulitis and sepsis, treated initially with vancomycin and then cephalosporin, and had severe AKI.  In 2015 she was admitted again for sepsis from cellulitis, again with AKI.  Her most recent admission was in July of this year, treated initially with clindamycin, did not improve, switched to cefazolin in  discussion with ID by phone, and discharged on Augmentin. As that hospitalization, as usual she had a Unna boot for about one week at Dr. Jess Barters office.  Typically when she has her Unna boots "my legs shrivel down to normal like a raisin" but then "puff back up" as soon as the boots come off.      ROS: Review of Systems  Constitutional: Positive for chills, fever and malaise/fatigue.  Respiratory: Negative for cough, sputum production and shortness of breath.   Cardiovascular: Positive for leg swelling (and right leg pain\). Negative for orthopnea and PND.  Genitourinary: Negative for dysuria, flank pain, frequency, hematuria and urgency.  All other systems reviewed and are negative.         Past Medical History:  Diagnosis Date  . Anemia   . CARDIOMYOPATHY, DILATED   . Chronic kidney disease   . Depression   . Diabetes mellitus ([place])   . GERD (gastroesophageal reflux disease)   . Hyperlipidemia   . Hypertension   . Hypothyroidism   . Morbid obesity ([place])   . Morbid obesity with BMI of 70 and over, adult ([place])   . Obesity hypoventilation syndrome ([place])   . OSA (obstructive sleep apnea)   . Recurrent cellulitis of lower leg    LLE, venous insuff  . Sepsis ([place]) 11/2012   Secondary to cellulitis  . Venous insufficiency of leg     Past Surgical History:  Procedure Laterality Date  . WISDOM TOOTH EXTRACTION      Social History: Patient lives with  her friend.  The patient walks unassisted.  She can walk several hundred yards at baseline without dyspnea.  She does not smoke.  She works as a Licensed conveyancer.  Allergies  Allergen Reactions  . Aspirin     REACTION: throat swelling, hives  . Doxycycline Other (See Comments)    Abdominal pain  . Lisinopril Cough  . Niaspan [Niacin Er]     Caused flushing    Family history: family history includes Heart disease in her father; Hypertension in her father and mother.  Prior to Admission medications   Medication Sig Start Date  End Date Taking? Authorizing Provider  furosemide (LASIX) 20 MG tablet TAKE 1 TO 2 TABLETS(20 TO 40 MG) BY MOUTH DAILY AS NEEDED FOR SWELLING Patient taking differently: Take 20 mg by mouth daily.  08/25/16  Yes Golden Circle, FNP  HYDROcodone-acetaminophen (NORCO/VICODIN) 5-325 MG tablet Take 1-2 tablets by mouth every 4 (four) hours as needed for moderate pain. 07/01/16  Yes Hosie Poisson, MD  JARDIANCE 10 MG TABS tablet TAKE 1 TABLET BY MOUTH DAILY 08/22/16  Yes Golden Circle, FNP  levothyroxine (SYNTHROID, LEVOTHROID) 150 MCG tablet TAKE 1 TABLET BY MOUTH EVERY MORNING BEFORE BREAKFAST 08/23/16  Yes Golden Circle, FNP  metoprolol succinate (TOPROL-XL) 50 MG 24 hr tablet TAKE 1 TABLET BY MOUTH EVERY DAY WITH OR FOLLOWING A MEAL 07/29/16  Yes Golden Circle, FNP  rosuvastatin (CRESTOR) 10 MG tablet TAKE 1 TABLET BY MOUTH EVERY DAY 08/23/16  Yes Golden Circle, FNP       Physical Exam: BP 140/71   Pulse 102   Temp (!) 103.1 F (39.5 C) (Oral)   Resp 22   Wt (!) 209.9 kg (462 lb 11.2 oz)   LMP 08/12/2016 (Approximate)   SpO2 93%   BMI 84.63 kg/m  General appearance: Well-developed, obese adult female, alert and in no acute distress.   Eyes: Anicteric, conjunctiva pink, lids and lashes normal. PERRL.    ENT: No nasal deformity, discharge, epistaxis.  Hearing normal. OP dry without lesions.   Skin: Warm and moist.  There are no areas of induration on the skin folds of the abdomen or back, the perineum.  On the left leg, there is redness and edema of the calf wtihout pain.  On the right calf, there is severe pain, as well as mild redness and edema. Cardiac: Tachycardic, regular, nl S1-S2, no murmurs appreciated.  Capillary refill is brisk.  JVP not visible.  Marked LE edema.   Respiratory: Normal respiratory rate and rhythm.  CTAB without rales or wheezes. Abdomen: Abdomen soft.  No TTP.  MSK: No deformities or effusions.  No cyanosis or clubbing. Neuro: Cranial nerves normal.   Sensation intact to light touch. Speech is fluent.  Muscle strength normal.    Psych: Sensorium intact and responding to questions, attention normal.  Behavior appropriate.  Affect normal.  Judgment and insight appear normal.     Labs on Admission:  I have personally reviewed following labs and imaging studies: CBC:  Recent Labs Lab 08/26/16 0245  WBC 13.5*  NEUTROABS 11.8*  HGB 14.5  HCT 47.9*  MCV 90.4  PLT [id number]   Basic Metabolic Panel:  Recent Labs Lab 08/26/16 0245  NA 139  K 3.9  CL 102  CO2 29  GLUCOSE 150*  BUN 16  CREATININE 0.94  CALCIUM 9.6   GFR: Estimated Creatinine Clearance: 148.9 mL/min (by C-G formula based on SCr of 0.94 mg/dL).  Liver Function Tests:  Recent Labs Lab 08/26/16 0245  AST 21  ALT 20  ALKPHOS 69  BILITOT 0.9  PROT 8.0  ALBUMIN 3.7   No results for input(s): LIPASE, AMYLASE in the last 168 hours. No results for input(s): AMMONIA in the last 168 hours. Coagulation Profile: No results for input(s): INR, PROTIME in the last 168 hours. Cardiac Enzymes: No results for input(s): CKTOTAL, CKMB, CKMBINDEX, TROPONINI in the last 168 hours. BNP (last 3 results) No results for input(s): PROBNP in the last 8760 hours. HbA1C: No results for input(s): HGBA1C in the last 72 hours. CBG: No results for input(s): GLUCAP in the last 168 hours. Lipid Profile: No results for input(s): CHOL, HDL, LDLCALC, TRIG, CHOLHDL, LDLDIRECT in the last 72 hours. Thyroid Function Tests: No results for input(s): TSH, T4TOTAL, FREET4, T3FREE, THYROIDAB in the last 72 hours. Anemia Panel: No results for input(s): VITAMINB12, FOLATE, FERRITIN, TIBC, IRON, RETICCTPCT in the last 72 hours. Sepsis Labs: Lactic acid 2.76 Invalid input(s): PROCALCITONIN, LACTICIDVEN No results found for this or any previous visit (from the past 240 hour(s)).          Assessment/Plan 1. Sepsis presumed from cellulitis:  Suspected source cellulitis (would not believe this  is necessarily infected if not for fever, tachycardia, lactate). Organism unknown. Patient meets criteria given tachycardia, tachypnea, fever, leukocytosis, and evidence of organ dysfunction.  Lactate 2.76 mmol/L and repeat ordered within 6 hours.  Antibiotics delivered in the ED.    -Sepsis bundle utilized:  -Blood and urine cultures drawn  -Will continue vancomycin and Zosyn for now, with low threshold to narrow (possibly in discussion with ID)   -Repeat renal function and complete blood count in AM  -Code SEPSIS called to E-link     2. HTN:  Very hypertensive at admission.  Feel reluctant to aggressively lower BP for now while septic. -Continue home metoprolol  3. OSA:  -Continue CPAP at night  4. Chronic diastolic CHF:  No symptoms of fluid overload.   -Check CXR -Strict I/Os  5. NIDDM:  -Hold home Jardiance -Sliding scale with meals  6. Hypothyroidism:  -Continue levothyroxine      DVT prophylaxis: Heparin  Code Status: FULL  Family Communication: Partner at bedside  Disposition Plan: Anticipate IV antibiotics and fluids and trend lactic acid. Consults called: None Admission status: INPATIENT, med surg       Medical decision making: Patient seen at 5:45 AM on 08/26/2016.  The patient was discussed with Dr. Maryan Rued.  What exists of the patient's chart was reviewed in depth and summarized above.  Clinical condition: stable.        Edwin Dada [facility] Pager [phone]

## 2016-08-26 NOTE — ED Notes (Signed)
Admitting MD at bedside.

## 2016-08-26 NOTE — ED Notes (Signed)
IV Team made two attempts to gain a 2nd IV per sepsis protocol, but was unsuccessful. Informed MD, who agreed to hold off on gaining further access until necessary.

## 2016-08-27 LAB — URINE CULTURE: Culture: NO GROWTH

## 2016-08-27 LAB — COMPREHENSIVE METABOLIC PANEL WITH GFR
ALT: 16 U/L (ref 14–54)
AST: 15 U/L (ref 15–41)
Albumin: 2.7 g/dL — ABNORMAL LOW (ref 3.5–5.0)
Alkaline Phosphatase: 58 U/L (ref 38–126)
Anion gap: 6 (ref 5–15)
BUN: 20 mg/dL (ref 6–20)
CO2: 26 mmol/L (ref 22–32)
Calcium: 8.2 mg/dL — ABNORMAL LOW (ref 8.9–10.3)
Chloride: 101 mmol/L (ref 101–111)
Creatinine, Ser: 1.26 mg/dL — ABNORMAL HIGH (ref 0.44–1.00)
GFR calc Af Amer: >60 mL/min
GFR calc non Af Amer: 54 mL/min — ABNORMAL LOW
Glucose, Bld: 138 mg/dL — ABNORMAL HIGH (ref 65–99)
Potassium: 4.1 mmol/L (ref 3.5–5.1)
Sodium: 133 mmol/L — ABNORMAL LOW (ref 135–145)
Total Bilirubin: 0.9 mg/dL (ref 0.3–1.2)
Total Protein: 6.7 g/dL (ref 6.5–8.1)

## 2016-08-27 LAB — VANCOMYCIN, TROUGH: Vancomycin Tr: 33 ug/mL (ref 15–20)

## 2016-08-27 LAB — CBC
HEMATOCRIT: 44.1 % (ref 36.0–46.0)
HEMOGLOBIN: 13.1 g/dL (ref 12.0–15.0)
MCH: 27 pg (ref 26.0–34.0)
MCHC: 29.7 g/dL — ABNORMAL LOW (ref 30.0–36.0)
MCV: 90.7 fL (ref 78.0–100.0)
Platelets: 173 10*3/uL (ref 150–400)
RBC: 4.86 MIL/uL (ref 3.87–5.11)
RDW: 18.9 % — AB (ref 11.5–15.5)
WBC: 10.4 10*3/uL (ref 4.0–10.5)

## 2016-08-27 LAB — GLUCOSE, CAPILLARY
GLUCOSE-CAPILLARY: 105 mg/dL — AB (ref 65–99)
GLUCOSE-CAPILLARY: 102 mg/dL — AB (ref 65–99)
Glucose-Capillary: 134 mg/dL — ABNORMAL HIGH (ref 65–99)
Glucose-Capillary: 130 mg/dL — ABNORMAL HIGH (ref 65–99)

## 2016-08-27 MED ORDER — VANCOMYCIN HCL 10 G IV SOLR
1250.0000 mg | Freq: Two times a day (BID) | INTRAVENOUS | Status: DC
  Administered 2016-08-27 – 2016-08-28 (×2): 1250 mg via INTRAVENOUS
  Filled 2016-08-27 (×3): qty 1250

## 2016-08-27 MED ORDER — ORAL CARE MOUTH RINSE
15.0000 mL | Freq: Two times a day (BID) | OROMUCOSAL | Status: DC
  Administered 2016-08-27: 15 mL via OROMUCOSAL

## 2016-08-27 MED ORDER — CHLORHEXIDINE GLUCONATE 0.12 % MT SOLN
15.0000 mL | Freq: Two times a day (BID) | OROMUCOSAL | Status: DC
  Administered 2016-08-27: 15 mL via OROMUCOSAL
  Filled 2016-08-27 (×2): qty 15

## 2016-08-27 MED ORDER — INFLUENZA VAC SPLIT QUAD 0.5 ML IM SUSY
0.5000 mL | PREFILLED_SYRINGE | INTRAMUSCULAR | Status: AC
  Administered 2016-08-28: 0.5 mL via INTRAMUSCULAR
  Filled 2016-08-27: qty 0.5

## 2016-08-27 MED ORDER — CALCIUM CARBONATE ANTACID 500 MG PO CHEW
1.0000 | CHEWABLE_TABLET | Freq: Once | ORAL | Status: AC
  Administered 2016-08-27: 200 mg via ORAL
  Filled 2016-08-27: qty 1

## 2016-08-27 MED ORDER — ORAL CARE MOUTH RINSE
15.0000 mL | Freq: Two times a day (BID) | OROMUCOSAL | Status: DC
Start: 2016-08-27 — End: 2016-08-27

## 2016-08-27 NOTE — Progress Notes (Signed)
RT NOTE:  Pt setup on home CPAP. 4L O2 bled in, humidity chamber filled. Pt comfortable and resting.

## 2016-08-27 NOTE — Progress Notes (Signed)
Patient 67% on ra after CPAP removed.  MD notified.

## 2016-08-27 NOTE — Progress Notes (Signed)
PROGRESS NOTE    Alexandra Beck  [id number] DOB: 1981-07-30 DOA: 08/26/2016 PCP: Mauricio Po, FNP  Brief Narrative:  35 y/o morbidly obese female with history of chronic venous insufficiency and lymphedema who presented with LE cellulitis.   Assessment & Plan:   Principal Problem:   Sepsis, unspecified organism ([place]) - secondary to cellulitis - continue antibiotic regimen - Blood cultures pending  Active Problems:    Essential hypertension - Metoprolol on board. Blood pressure stable on this regimen    Morbid obesity with BMI of 70 and over, adult ([place])   Recurrent cellulitis of lower leg - Most likely cause of principle problem - continue current antibiotics. Narrow next am.    OSA (obstructive sleep apnea)   CHF (congestive heart failure) ([place]) - compensated currently    Diabetes mellitus type 2 in obese ([place]) - Pt on SSI and diabetic diet    Hypothyroidism - Continue synthroid   DVT prophylaxis: Heparin Code Status: Full Family Communication: None at bedside Disposition Plan: Pending improvement in condition   Consultants:   None   Procedures: None   Antimicrobials: Vancomycin and Zosyn   Subjective: Pt has no new complaints. No acute issues overnight  Objective: Vitals:   08/27/16 0615 08/27/16 0850 08/27/16 0851 08/27/16 1100  BP: (!) 105/51  (!) 150/65   Pulse: 78  78   Resp: 18  20   Temp: 98.7 F (37.1 C)  98.6 F (37 C)   TempSrc: Oral  Oral   SpO2: 98% (!) 67% 97%   Weight:      Height:    5\' 2"  (1.575 m)    Intake/Output Summary (Last 24 hours) at 08/27/16 1609 Last data filed at 08/27/16 1400  Gross per 24 hour  Intake             1760 ml  Output              650 ml  Net             1110 ml   Filed Weights   08/26/16 0236  Weight: (!) 209.9 kg (462 lb 11.2 oz)    Examination:  General exam: Appears calm and comfortable, in nad. Respiratory system: Clear to auscultation. Respiratory effort normal. Equal chest  rise. Cardiovascular system: S1 & S2 heard, RRR. No JVD, murmurs, rubs, gallops or clicks. No pedal edema. Gastrointestinal system: Abdomen is nondistended, soft and nontender. No organomegaly or masses felt. Normal bowel sounds heard. Central nervous system: Alert and oriented. No focal neurological deficits. Extremities: + cellulitis, errythema, calor, no fluctuance Skin: cellulitis at Lower extremity, warm and dry otherwise Psychiatry: Judgement and insight appear normal. Mood & affect appropriate.     Data Reviewed: I have personally reviewed following labs and imaging studies  CBC:  Recent Labs Lab 08/26/16 0245 08/27/16 0832  WBC 13.5* 10.4  NEUTROABS 11.8*  --   HGB 14.5 13.1  HCT 47.9* 44.1  MCV 90.4 90.7  PLT 222 [id number]   Basic Metabolic Panel:  Recent Labs Lab 08/26/16 0245 08/27/16 1100  NA 139 133*  K 3.9 4.1  CL 102 101  CO2 29 26  GLUCOSE 150* 138*  BUN 16 20  CREATININE 0.94 1.26*  CALCIUM 9.6 8.2*   GFR: Estimated Creatinine Clearance: 111.1 mL/min (by C-G formula based on SCr of 1.26 mg/dL (H)). Liver Function Tests:  Recent Labs Lab 08/26/16 0245 08/27/16 1100  AST 21 15  ALT 20 16  ALKPHOS 69 58  BILITOT 0.9 0.9  PROT 8.0 6.7  ALBUMIN 3.7 2.7*   No results for input(s): LIPASE, AMYLASE in the last 168 hours. No results for input(s): AMMONIA in the last 168 hours. Coagulation Profile: No results for input(s): INR, PROTIME in the last 168 hours. Cardiac Enzymes: No results for input(s): CKTOTAL, CKMB, CKMBINDEX, TROPONINI in the last 168 hours. BNP (last 3 results) No results for input(s): PROBNP in the last 8760 hours. HbA1C: No results for input(s): HGBA1C in the last 72 hours. CBG:  Recent Labs Lab 08/26/16 1106 08/26/16 1623 08/26/16 2111 08/27/16 0757 08/27/16 1155  GLUCAP 159* 115* 125* 134* 130*   Lipid Profile: No results for input(s): CHOL, HDL, LDLCALC, TRIG, CHOLHDL, LDLDIRECT in the last 72 hours. Thyroid Function  Tests: No results for input(s): TSH, T4TOTAL, FREET4, T3FREE, THYROIDAB in the last 72 hours. Anemia Panel: No results for input(s): VITAMINB12, FOLATE, FERRITIN, TIBC, IRON, RETICCTPCT in the last 72 hours. Sepsis Labs:  Recent Labs Lab 08/26/16 0311 08/26/16 0542 08/26/16 0634 08/26/16 1055  LATICACIDVEN 2.76* 2.25* 2.7* 1.7    Recent Results (from the past 240 hour(s))  Culture, blood (Routine x 2)     Status: None (Preliminary result)   Collection Time: 08/26/16  2:50 AM  Result Value Ref Range Status   Specimen Description BLOOD LEFT ARM  Final   Special Requests BOTTLES DRAWN AEROBIC AND ANAEROBIC 5ML  Final   Culture NO GROWTH 1 DAY  Final   Report Status PENDING  Incomplete  Urine culture     Status: None   Collection Time: 08/26/16  4:49 AM  Result Value Ref Range Status   Specimen Description URINE, RANDOM  Final   Special Requests NONE  Final   Culture NO GROWTH  Final   Report Status 08/27/2016 FINAL  Final  Culture, blood (Routine x 2)     Status: None (Preliminary result)   Collection Time: 08/26/16  9:16 AM  Result Value Ref Range Status   Specimen Description BLOOD LEFT ARM  Final   Special Requests BOTTLES DRAWN AEROBIC AND ANAEROBIC 5CC  Final   Culture NO GROWTH 1 DAY  Final   Report Status PENDING  Incomplete         Radiology Studies: Portable Chest 1 View  Result Date: 08/26/2016 CLINICAL DATA:  Sepsis. Hx of caridomyopathy, DM, HTN, Obesity hypoventilation syndrome EXAM: PORTABLE CHEST - 1 VIEW COMPARISON:  the previous day's study FINDINGS: Perihilar and bibasilar vascular congestion, new since prior study. No confluent airspace disease. Heart size upper limits normal for technique. No effusion or pneumothorax. Visualized bones unremarkable. IMPRESSION: Pulmonary vascular congestion, new since prior study. Electronically Signed   By: Lucrezia Europe M.D.   On: 08/26/2016 07:49        Scheduled Meds: . heparin  5,000 Units Subcutaneous Q8H  .  [START ON 08/28/2016] Influenza vac split quadrivalent PF  0.5 mL Intramuscular Tomorrow-1000  . insulin aspart  0-15 Units Subcutaneous TID WC  . insulin aspart  0-5 Units Subcutaneous QHS  . levothyroxine  150 mcg Oral QAC breakfast  . metoprolol succinate  50 mg Oral Daily  . piperacillin-tazobactam (ZOSYN)  IV  3.375 g Intravenous Q8H  . rosuvastatin  10 mg Oral Daily  . vancomycin  1,250 mg Intravenous Q12H   Continuous Infusions: . sodium chloride 100 mL/hr at 08/27/16 1253     LOS: 1 day    Time spent: > 35 minutes  Velvet Bathe, MD [facility] Pager [phone]  If 7PM-7AM, please contact night-coverage [email or web address] Password [email or web address] 08/27/2016, 4:09 PM

## 2016-08-27 NOTE — Progress Notes (Deleted)
Discharge instructions and medications discussed with patient.  All questions answered.  

## 2016-08-27 NOTE — Progress Notes (Signed)
Pharmacy Antibiotic Note  MIKAIAH NEWCOMBE is a 35 y.o. female admitted on 08/26/2016 with cellulitis.  Pharmacy has been consulted for Vancomycin/Zosyn dosing. WBC mildly elevated, lactic acid elevated, renal function good.   Vancomycin trough this AM is elevated at 33  Plan: -Hold vancomycin for now -Check random vancomycin level at 2000 to assess clearance  Weight: (!) 462 lb 11.2 oz (209.9 kg)  Temp (24hrs), Avg:100.6 F (38.1 C), Min:98.5 F (36.9 C), Max:103.2 F (39.6 C)   Recent Labs Lab 08/26/16 0245 08/26/16 0311 08/26/16 0542 08/26/16 0634 08/26/16 1055 08/27/16 0551  WBC 13.5*  --   --   --   --   --   CREATININE 0.94  --   --   --   --   --   LATICACIDVEN  --  2.76* 2.25* 2.7* 1.7  --   VANCOTROUGH  --   --   --   --   --  33*    Estimated Creatinine Clearance: 148.9 mL/min (by C-G formula based on SCr of 0.94 mg/dL).    Allergies  Allergen Reactions  . Aspirin     REACTION: throat swelling, hives  . Doxycycline Other (See Comments)    Abdominal pain  . Lisinopril Cough  . Niaspan [Niacin Er]     Caused flushing    Narda Bonds 08/27/2016 6:49 AM

## 2016-08-27 NOTE — Progress Notes (Signed)
As I was hanging IV vancomycin this afternoon the patient appeared upset that she had not been switched to oral antibiotics.  I explained to the patient that this was a doctors decision to switch to PO.  The patient then asked if the doctor would be here in the morning.  I explained to the patient that the doctor would come to see her tomorrow.  She asked if she would be going home after the doctor sees her.  I explained to the patient that again that would be up to the doctor when she would be discharged.  Patient's eyes filled up with tears. I informed the patient that I work tomorrow and I would let her know as soon as I see him up on the unit.  I left the patient's room and the patient's mother came out to speak with me. The mother asked me to tell her daughter something encouraging such as you may get to go home tomorrow.  I informed the mother that I did not see anything in the doctors note from today that he was discharging her tomorrow and that I would keep the patient updated with anything that I find out but I would not give misinformation.

## 2016-08-27 NOTE — Progress Notes (Signed)
Patient is requesting an antiacid.  MD notified.

## 2016-08-27 NOTE — Progress Notes (Signed)
Pharmacy Antibiotic Note Alexandra Beck is a 35 y.o. female admitted on 08/26/2016 with cellulitis.  Currently on day 2 of Zosyn and vancomycin for treatment.   Vancomycin trough this AM is elevated at 33 on vancomycin dose of 1500 mg every 8 hours. SCr 1.26 < 0.94  Plan: - Adjust vancomycin dose to 1250 mg every 12 hours starting this evening - BMP in am  - Continue extended interval Zosyn at 3.375 grams IV every 8 hours  - Deescalate abx as feasible  - Following closely   Height: 5\' 2"  (157.5 cm) Weight: (!) 462 lb 11.2 oz (209.9 kg) IBW/kg (Calculated) : 50.1  Temp (24hrs), Avg:98.6 F (37 C), Min:98.5 F (36.9 C), Max:98.7 F (37.1 C)   Recent Labs Lab 08/26/16 0245 08/26/16 0311 08/26/16 0542 08/26/16 0634 08/26/16 1055 08/27/16 0551 08/27/16 0832 08/27/16 1100  WBC 13.5*  --   --   --   --   --  10.4  --   CREATININE 0.94  --   --   --   --   --   --  1.26*  LATICACIDVEN  --  2.76* 2.25* 2.7* 1.7  --   --   --   VANCOTROUGH  --   --   --   --   --  33*  --   --     Estimated Creatinine Clearance: 111.1 mL/min (by C-G formula based on SCr of 1.26 mg/dL (H)).    Allergies  Allergen Reactions  . Aspirin     REACTION: throat swelling, hives  . Doxycycline Other (See Comments)    Abdominal pain  . Lisinopril Cough  . Niaspan [Niacin Er]     Caused flushing   Antimicrobials this admission:  Vanc 9/29 >>  Zosyn 9/29 >>   Dose adjustments this admission:  9/30 VT = 33  Microbiology results:  9/29 BCx: sent 9/29 UCx: sent    Vincenza Hews, PharmD, BCPS 08/27/2016, 12:30 PM Pager: [phone]

## 2016-08-27 NOTE — Progress Notes (Signed)
Placed patient on home CPAP for the night with oxygen set at 4lpm.  

## 2016-08-28 LAB — BASIC METABOLIC PANEL
Anion gap: 7 (ref 5–15)
BUN: 20 mg/dL (ref 6–20)
CALCIUM: 9 mg/dL (ref 8.9–10.3)
CO2: 28 mmol/L (ref 22–32)
CREATININE: 1.32 mg/dL — AB (ref 0.44–1.00)
Chloride: 102 mmol/L (ref 101–111)
GFR calc non Af Amer: 51 mL/min — ABNORMAL LOW (ref >60)
GFR, EST AFRICAN AMERICAN: 59 mL/min — AB (ref >60)
Glucose, Bld: 104 mg/dL — ABNORMAL HIGH (ref 65–99)
Potassium: 4.3 mmol/L (ref 3.5–5.1)
SODIUM: 137 mmol/L (ref 135–145)

## 2016-08-28 LAB — GLUCOSE, CAPILLARY: GLUCOSE-CAPILLARY: 108 mg/dL — AB (ref 65–99)

## 2016-08-28 MED ORDER — CLINDAMYCIN HCL 300 MG PO CAPS: 300.0000 mg | ORAL_CAPSULE | Freq: Three times a day (TID) | ORAL | 0 refills | Status: DC

## 2016-08-28 MED ORDER — CLINDAMYCIN HCL 300 MG PO CAPS
300.0000 mg | ORAL_CAPSULE | Freq: Three times a day (TID) | ORAL | Status: DC
  Administered 2016-08-28: 300 mg via ORAL
  Filled 2016-08-28: qty 1

## 2016-08-28 NOTE — Discharge Summary (Signed)
Physician Discharge Summary  Alexandra Beck [id number] DOB: Dec 05, 1980 DOA: 08/26/2016  PCP: Mauricio Po, FNP  Admit date: 08/26/2016 Discharge date: 08/28/2016  Time spent: > 35 minutes  Recommendations for Outpatient Follow-up:  Monitor serum creatinine Decide whether or not to prolong antibiotic regimen. I will treat for a total of 7 days   Discharge Diagnoses:  Principal Problem:   Sepsis, unspecified organism [facility]) Active Problems:   Essential hypertension   Morbid obesity with BMI of 70 and over, adult ([place])   Recurrent cellulitis of lower leg   OSA (obstructive sleep apnea)   CHF (congestive heart failure) ([place])   Diabetes mellitus type 2 in obese ([place])   Hypothyroidism   Sepsis ([place])   Discharge Condition: stable  Diet recommendation: heart healthy carb modified diet  Filed Weights   08/26/16 0236  Weight: (!) 209.9 kg (462 lb 11.2 oz)    History of present illness:  35 y.o. female with a past medical history significant for MO, chronic venous insufficiency and lymphedema complicated by recurrent cellulitis, OSA on CPAP, diastolic CHF, NIDDM, hypothyroidism and HTN who presents with fever and leg pain.  Hospital Course:  Principal Problem:   Sepsis, unspecified organism ([place]) - resolved with IV antibiotics vanc and zosyn - pt will need 4 more days of antibiotic after discharge to complete a 7 day treatment course. - blood cultures negative to date.  Active Problems:    Essential hypertension - will continue B blocker on d/c    Morbid obesity with BMI of 70 and over, adult ([place])   Recurrent cellulitis of lower leg - Most likely cause of principle problem - continue current antibiotics. Narrow next am.    OSA (obstructive sleep apnea)   CHF (congestive heart failure) ([place]) - compensated currently    Diabetes mellitus type 2 in obese ([place]) - Pt continue diabetic diet and home medication regimen.    Hypothyroidism - Continue  synthroid   Procedures:  None  Consultations:  none  Discharge Exam: Vitals:   08/28/16 0433 08/28/16 0958  BP: 116/65 127/71  Pulse: 60 75  Resp: 17 18  Temp: 97.9 F (36.6 C) 98.3 F (36.8 C)    General: Pt in nad, alert and awake Cardiovascular: rrr, no rubs Respiratory: no increased wob, no wheezes  Discharge Instructions   Discharge Instructions    Call MD for:  severe uncontrolled pain    Complete by:  As directed    Call MD for:  temperature >100.4    Complete by:  As directed    Diet - low sodium heart healthy    Complete by:  As directed    Discharge instructions    Complete by:  As directed    Please be sure to follow up with your primary care physician in 1-2 weeks or sooner should any new concerns arise.   Increase activity slowly    Complete by:  As directed      Current Discharge Medication List    START taking these medications   Details  clindamycin (CLEOCIN) 300 MG capsule Take 1 capsule (300 mg total) by mouth every 8 (eight) hours. Qty: 12 capsule, Refills: 0      CONTINUE these medications which have NOT CHANGED   Details  furosemide (LASIX) 20 MG tablet TAKE 1 TO 2 TABLETS(20 TO 40 MG) BY MOUTH DAILY AS NEEDED FOR SWELLING Qty: 60 tablet, Refills: 2    HYDROcodone-acetaminophen (NORCO/VICODIN) 5-325 MG tablet Take 1-2 tablets by mouth every  4 (four) hours as needed for moderate pain. Qty: 10 tablet, Refills: 0    JARDIANCE 10 MG TABS tablet TAKE 1 TABLET BY MOUTH DAILY Qty: 30 tablet, Refills: 0    levothyroxine (SYNTHROID, LEVOTHROID) 150 MCG tablet TAKE 1 TABLET BY MOUTH EVERY MORNING BEFORE BREAKFAST Qty: 30 tablet, Refills: 0   Associated Diagnoses: Hypothyroidism, unspecified hypothyroidism type    metoprolol succinate (TOPROL-XL) 50 MG 24 hr tablet TAKE 1 TABLET BY MOUTH EVERY DAY WITH OR FOLLOWING A MEAL Qty: 30 tablet, Refills: 0    rosuvastatin (CRESTOR) 10 MG tablet TAKE 1 TABLET BY MOUTH EVERY DAY Qty: 30 tablet,  Refills: 0       Allergies  Allergen Reactions  . Aspirin     REACTION: throat swelling, hives  . Doxycycline Other (See Comments)    Abdominal pain  . Lisinopril Cough  . Niaspan [Niacin Er]     Caused flushing      The results of significant diagnostics from this hospitalization (including imaging, microbiology, ancillary and laboratory) are listed below for reference.    Significant Diagnostic Studies: Portable Chest 1 View  Result Date: 08/26/2016 CLINICAL DATA:  Sepsis. Hx of caridomyopathy, DM, HTN, Obesity hypoventilation syndrome EXAM: PORTABLE CHEST - 1 VIEW COMPARISON:  the previous day's study FINDINGS: Perihilar and bibasilar vascular congestion, new since prior study. No confluent airspace disease. Heart size upper limits normal for technique. No effusion or pneumothorax. Visualized bones unremarkable. IMPRESSION: Pulmonary vascular congestion, new since prior study. Electronically Signed   By: Lucrezia Europe M.D.   On: 08/26/2016 07:49    Microbiology: Recent Results (from the past 240 hour(s))  Culture, blood (Routine x 2)     Status: None (Preliminary result)   Collection Time: 08/26/16  2:50 AM  Result Value Ref Range Status   Specimen Description BLOOD LEFT ARM  Final   Special Requests BOTTLES DRAWN AEROBIC AND ANAEROBIC 5ML  Final   Culture NO GROWTH 1 DAY  Final   Report Status PENDING  Incomplete  Urine culture     Status: None   Collection Time: 08/26/16  4:49 AM  Result Value Ref Range Status   Specimen Description URINE, RANDOM  Final   Special Requests NONE  Final   Culture NO GROWTH  Final   Report Status 08/27/2016 FINAL  Final  Culture, blood (Routine x 2)     Status: None (Preliminary result)   Collection Time: 08/26/16  9:16 AM  Result Value Ref Range Status   Specimen Description BLOOD LEFT ARM  Final   Special Requests BOTTLES DRAWN AEROBIC AND ANAEROBIC 5CC  Final   Culture NO GROWTH 1 DAY  Final   Report Status PENDING  Incomplete      Labs: Basic Metabolic Panel:  Recent Labs Lab 08/26/16 0245 08/27/16 1100 08/28/16 0600  NA 139 133* 137  K 3.9 4.1 4.3  CL 102 101 102  CO2 29 26 28   GLUCOSE 150* 138* 104*  BUN 16 20 20   CREATININE 0.94 1.26* 1.32*  CALCIUM 9.6 8.2* 9.0   Liver Function Tests:  Recent Labs Lab 08/26/16 0245 08/27/16 1100  AST 21 15  ALT 20 16  ALKPHOS 69 58  BILITOT 0.9 0.9  PROT 8.0 6.7  ALBUMIN 3.7 2.7*   No results for input(s): LIPASE, AMYLASE in the last 168 hours. No results for input(s): AMMONIA in the last 168 hours. CBC:  Recent Labs Lab 08/26/16 0245 08/27/16 0832  WBC 13.5* 10.4  NEUTROABS 11.8*  --  HGB 14.5 13.1  HCT 47.9* 44.1  MCV 90.4 90.7  PLT 222 173   Cardiac Enzymes: No results for input(s): CKTOTAL, CKMB, CKMBINDEX, TROPONINI in the last 168 hours. BNP: BNP (last 3 results) No results for input(s): BNP in the last 8760 hours.  ProBNP (last 3 results) No results for input(s): PROBNP in the last 8760 hours.  CBG:  Recent Labs Lab 08/27/16 0757 08/27/16 1155 08/27/16 1708 08/27/16 2125 08/28/16 1145  GLUCAP 134* 130* 105* 102* 108*    Signed:  Velvet Bathe MD.  [facility] 08/28/2016, 1:48 PM

## 2016-08-28 NOTE — Progress Notes (Signed)
Discharge instructions and medications discussed with patient.  Prescription given to patient.  All questions answered.  

## 2016-08-29 ENCOUNTER — Telehealth: Payer: Self-pay | Admitting: Family

## 2016-08-29 LAB — GLUCOSE, CAPILLARY: Glucose-Capillary: 100 mg/dL — ABNORMAL HIGH (ref 65–99)

## 2016-08-29 NOTE — Telephone Encounter (Signed)
Pt refused to call 911 when I was on the phone with her. She stated that she was just in the hospital. She wanted to be seen by our office. I scheduled her for Tuesday and explained why she should call 911. Pt stated understanding and that if SOB gets worse, she will call.

## 2016-08-29 NOTE — Telephone Encounter (Signed)
Patient Name: Alexandra Beck DOB: 07-Dec-1980 Initial Comment Caller states c/o difficulty breathing and excess fluid. She was recently discharged for cellulites and wasn't given diuretic during hospital stay. Nurse Assessment Nurse: Vallery Sa, RN, [place] Date/Time (Eastern Time): 08/29/2016 11:48:17 AM Confirm and document reason for call. If symptomatic, describe symptoms. You must click the next button to save text entered. ---Caller states she developed shortness of breath yesterday. She developed swelling of both lower legs, stomach and chest 3 days ago. She was in the hospital for cellulits in her leg and the area is still red and swollen. Alert and responsive. Has the patient traveled out of the country within the last 30 days? ---No Does the patient have any new or worsening symptoms? ---Yes Will a triage be completed? ---Yes Related visit to physician within the last 2 weeks? ---Yes Does the PT have any chronic conditions? (i.e. diabetes, asthma, etc.) ---Yes List chronic conditions. ---Cellulitis, Sleep Apnea, Thyroid problems, High Blood Pressure and Cholesterol, High Blood Sugar, CHF in the past Is the patient pregnant or possibly pregnant? (Ask all females between the ages of 45-55) ---No Is this a behavioral health or substance abuse call? ---No Guidelines Guideline Title Affirmed Question Affirmed Notes Chest Pain [1] Chest pain lasts > 5 minutes AND [2] history of heart disease (i.e., heart attack, bypass surgery, angina, angioplasty, CHF; not just a heart murmur) Final Disposition User Call EMS 911 Now [place], RN, [employer] declined the Call 911 disposition. Reinforced the Call 911 disposition. Called the office backline and notified [place] that [facility] declined the Call 911 disposition. Connected [place] and [place] together. Referrals GO TO FACILITY REFUSED Disagree/Comply: Disagree Disagree/Comply Reason: Disagree with instructions

## 2016-08-30 ENCOUNTER — Ambulatory Visit (INDEPENDENT_AMBULATORY_CARE_PROVIDER_SITE_OTHER): Payer: BC Managed Care – PPO | Admitting: Nurse Practitioner

## 2016-08-30 ENCOUNTER — Ambulatory Visit (INDEPENDENT_AMBULATORY_CARE_PROVIDER_SITE_OTHER)
Admission: RE | Admit: 2016-08-30 | Discharge: 2016-08-30 | Disposition: A | Discharge status: Home / Self Care | Payer: BC Managed Care – PPO | Source: Ambulatory Visit | Attending: Nurse Practitioner | Admitting: Nurse Practitioner

## 2016-08-30 ENCOUNTER — Encounter: Payer: Self-pay | Admitting: Nurse Practitioner

## 2016-08-30 ENCOUNTER — Other Ambulatory Visit (INDEPENDENT_AMBULATORY_CARE_PROVIDER_SITE_OTHER): Payer: BC Managed Care – PPO

## 2016-08-30 VITALS — BP 152/90 | Temp 98.9°F | Ht 62.0 in | Wt >= 6400 oz

## 2016-08-30 DIAGNOSIS — R110 Nausea: Secondary | ICD-10-CM

## 2016-08-30 DIAGNOSIS — R0609 Other forms of dyspnea: Secondary | ICD-10-CM

## 2016-08-30 DIAGNOSIS — R198 Other specified symptoms and signs involving the digestive system and abdomen: Secondary | ICD-10-CM

## 2016-08-30 DIAGNOSIS — I5032 Chronic diastolic (congestive) heart failure: Secondary | ICD-10-CM | POA: Diagnosis not present

## 2016-08-30 LAB — BRAIN NATRIURETIC PEPTIDE: Pro B Natriuretic peptide (BNP): 641 pg/mL — ABNORMAL HIGH (ref 0.0–100.0)

## 2016-08-30 LAB — CBC WITH DIFFERENTIAL/PLATELET
BASOS ABS: 0 10*3/uL (ref 0.0–0.1)
BASOS PCT: 0.3 % (ref 0.0–3.0)
EOS ABS: 0.1 10*3/uL (ref 0.0–0.7)
Eosinophils Relative: 0.4 % (ref 0.0–5.0)
HCT: 38.1 % (ref 36.0–46.0)
Hemoglobin: 12.4 g/dL (ref 12.0–15.0)
LYMPHS ABS: 1.7 10*3/uL (ref 0.7–4.0)
Lymphocytes Relative: 14.1 % (ref 12.0–46.0)
MCHC: 32.7 g/dL (ref 30.0–36.0)
MCV: 82.3 fl (ref 78.0–100.0)
Monocytes Absolute: 1.1 10*3/uL — ABNORMAL HIGH (ref 0.1–1.0)
Monocytes Relative: 8.8 % (ref 3.0–12.0)
NEUTROS ABS: 9.3 10*3/uL — AB (ref 1.4–7.7)
NEUTROS PCT: 76.4 % (ref 43.0–77.0)
PLATELETS: 262 10*3/uL (ref 150.0–400.0)
RBC: 4.63 Mil/uL (ref 3.87–5.11)
RDW: 19 % — AB (ref 11.5–15.5)
WBC: 12.2 10*3/uL — ABNORMAL HIGH (ref 4.0–10.5)

## 2016-08-30 LAB — HEPATIC FUNCTION PANEL
ALK PHOS: 92 U/L (ref 39–117)
ALT: 17 U/L (ref 0–35)
AST: 13 U/L (ref 0–37)
Albumin: 3.4 g/dL — ABNORMAL LOW (ref 3.5–5.2)
BILIRUBIN DIRECT: 0.1 mg/dL (ref 0.0–0.3)
Total Bilirubin: 0.7 mg/dL (ref 0.2–1.2)
Total Protein: 7.8 g/dL (ref 6.0–8.3)

## 2016-08-30 LAB — LIPASE: LIPASE: 21 U/L (ref 11.0–59.0)

## 2016-08-30 NOTE — Progress Notes (Signed)
Pre visit review using our clinic review tool, if applicable. No additional management support is needed unless otherwise documented below in the visit note. 

## 2016-08-30 NOTE — Progress Notes (Signed)
Subjective:  Patient ID: Alexandra Beck, female    DOB: Aug 27, 1981  Age: 35 y.o. MRN: [id number]  CC: Abdominal Pain (Pt stated having stomach pain/ discomfort and back on the stool for about 4 days) and Shortness of Breath   Abdominal Pain  This is a new problem. The current episode started in the past 7 days. The onset quality is gradual. The problem occurs constantly. The problem has been unchanged. The pain is located in the epigastric region. The pain is at a severity of 6/10. The pain is moderate. The quality of the pain is a sensation of fullness and dull. The abdominal pain radiates to the back. Associated symptoms include anorexia and nausea. Pertinent negatives include no arthralgias, belching, constipation, diarrhea, dysuria, fever, flatus, frequency, headaches, hematochezia, hematuria, melena, myalgias, vomiting or weight loss. The pain is aggravated by palpation and movement (orthopnea). The pain is relieved by sitting up. She has tried nothing for the symptoms. There is no history of abdominal surgery, gallstones, GERD, pancreatitis or PUD.  Shortness of Breath  This is a new problem. The current episode started in the past 7 days. The problem occurs intermittently. The problem has been unchanged. Associated symptoms include abdominal pain and orthopnea. Pertinent negatives include no chest pain, claudication, fever, headaches, hemoptysis, leg pain, PND, rhinorrhea, sputum production, syncope, vomiting or wheezing. The symptoms are aggravated by lying flat. The patient has no known risk factors for DVT/PE. Treatments tried: Lasix 40 mg oral. The treatment provided mild relief. Her past medical history is significant for a heart failure. There is no history of CAD, chronic lung disease, PE or pneumonia.  She was evaluated in the emergency room for similar complaints, treated with Zofran. Denies any nausea during office visit.  Outpatient Medications Prior to Visit  Medication Sig Dispense  Refill  . JARDIANCE 10 MG TABS tablet TAKE 1 TABLET BY MOUTH DAILY 30 tablet 0  . levothyroxine (SYNTHROID, LEVOTHROID) 150 MCG tablet TAKE 1 TABLET BY MOUTH EVERY MORNING BEFORE BREAKFAST 30 tablet 0  . metoprolol succinate (TOPROL-XL) 50 MG 24 hr tablet TAKE 1 TABLET BY MOUTH EVERY DAY WITH OR FOLLOWING A MEAL 30 tablet 0  . rosuvastatin (CRESTOR) 10 MG tablet TAKE 1 TABLET BY MOUTH EVERY DAY 30 tablet 0  . clindamycin (CLEOCIN) 300 MG capsule Take 1 capsule (300 mg total) by mouth every 8 (eight) hours. 12 capsule 0  . furosemide (LASIX) 20 MG tablet TAKE 1 TO 2 TABLETS(20 TO 40 MG) BY MOUTH DAILY AS NEEDED FOR SWELLING (Patient taking differently: Take 20 mg by mouth daily. ) 60 tablet 2  . HYDROcodone-acetaminophen (NORCO/VICODIN) 5-325 MG tablet Take 1-2 tablets by mouth every 4 (four) hours as needed for moderate pain. (Patient not taking: Reported on 08/30/2016) 10 tablet 0   No facility-administered medications prior to visit.     ROS See HPI  Objective:  BP (!) 152/90   Temp 98.9 F (37.2 C)   Ht 5\' 2"  (1.575 m)   Wt (!) 470 lb (213.2 kg)   LMP 08/12/2016 (Approximate)   SpO2 98%   BMI 85.96 kg/m   BP Readings from Last 3 Encounters:  08/30/16 (!) 152/90  08/28/16 127/71  07/05/16 (!) 160/98    Wt Readings from Last 3 Encounters:  08/30/16 (!) 470 lb (213.2 kg)  08/26/16 (!) 462 lb 11.2 oz (209.9 kg)  07/05/16 (!) 481 lb (218.2 kg)    Physical Exam  Constitutional: She is oriented to person, place, and  time. No distress.  Neck: Normal range of motion. Neck supple.  Cardiovascular: Normal rate, regular rhythm and normal heart sounds.   Pulmonary/Chest: Effort normal and breath sounds normal.  Abdominal: Soft. Bowel sounds are normal. She exhibits no distension. There is no tenderness.  Musculoskeletal: She exhibits edema.  Neurological: She is alert and oriented to person, place, and time.  Skin: Skin is warm and dry.  Vitals reviewed.   Lab Results    Component Value Date   WBC 12.2 (H) 08/30/2016   HGB 12.4 08/30/2016   HCT 38.1 08/30/2016   PLT 262.0 08/30/2016   GLUCOSE 104 (H) 08/28/2016   CHOL 228 (H) 03/02/2016   TRIG 295.0 (H) 03/02/2016   HDL 41.10 03/02/2016   LDLDIRECT 141.0 03/02/2016   LDLCALC 92 07/15/2013   ALT 17 08/30/2016   AST 13 08/30/2016   NA 137 08/28/2016   K 4.3 08/28/2016   CL 102 08/28/2016   CREATININE 1.32 (H) 08/28/2016   BUN 20 08/28/2016   CO2 28 08/28/2016   TSH 3.83 03/02/2016   INR 1.18 01/07/2013   HGBA1C 6.9 (H) 03/02/2016   MICROALBUR 8.05 (H) 06/26/2007    Portable Chest 1 View  Result Date: 08/26/2016 CLINICAL DATA:  Sepsis. Hx of caridomyopathy, DM, HTN, Obesity hypoventilation syndrome EXAM: PORTABLE CHEST - 1 VIEW COMPARISON:  the previous day's study FINDINGS: Perihilar and bibasilar vascular congestion, new since prior study. No confluent airspace disease. Heart size upper limits normal for technique. No effusion or pneumothorax. Visualized bones unremarkable. IMPRESSION: Pulmonary vascular congestion, new since prior study. Electronically Signed   By: Lucrezia Europe M.D.   On: 08/26/2016 07:49    Assessment & Plan:   L[place] was seen today for abdominal pain and shortness of breath.  Diagnoses and all orders for this visit:  Abdominal fullness -     Lipase; Future -     Hepatic function panel; Future -     DG Chest 2 View; Future -     Brain natriuretic peptide; Future -     CBC w/Diff; Future  Dyspnea on exertion -     Lipase; Future -     Hepatic function panel; Future -     DG Chest 2 View; Future -     Brain natriuretic peptide; Future -     CBC w/Diff; Future -     sacubitril-valsartan (ENTRESTO) 24-26 MG; Take 1 tablet by mouth 2 (two) times daily.  Nausea -     Lipase; Future -     Hepatic function panel; Future -     DG Chest 2 View; Future -     Brain natriuretic peptide; Future -     CBC w/Diff; Future  Chronic diastolic congestive heart failure (HCC) -      sacubitril-valsartan (ENTRESTO) 24-26 MG; Take 1 tablet by mouth 2 (two) times daily.   I have discontinued Ms. Dillahunt clindamycin, fluconazole, sulfamethoxazole-trimethoprim, and amoxicillin-clavulanate. I am also having her start on sacubitril-valsartan. Additionally, I am having her maintain her HYDROcodone-acetaminophen, metoprolol succinate, JARDIANCE, levothyroxine, rosuvastatin, PREVIDENT 5000 BOOSTER PLUS, and pantoprazole.  Meds ordered this encounter  Medications  . DISCONTD: fluconazole (DIFLUCAN) 150 MG tablet  . DISCONTD: sulfamethoxazole-trimethoprim (BACTRIM DS,SEPTRA DS) 800-160 MG tablet    Sig: TK 1 T PO BID FOR 2 WKS    Refill:  0  . PREVIDENT 5000 BOOSTER PLUS 1.1 % PSTE    Sig: BRUSH ON TEETH BID FOR 2 MINS EACH TIME. EXPECTORATE AS MUCH  AS POSSIBLE THEN DO NOT SWISH EAT OR DRINK FOR 30 MINS AFTER TREATMENT    Refill:  0  . DISCONTD: amoxicillin-clavulanate (AUGMENTIN) 875-125 MG tablet  . pantoprazole (PROTONIX) 40 MG tablet  . DISCONTD: metoprolol succinate (TOPROL-XL) 100 MG 24 hr tablet  . sacubitril-valsartan (ENTRESTO) 24-26 MG    Sig: Take 1 tablet by mouth 2 (two) times daily.    Dispense:  60 tablet    Refill:  1    Order Specific Question:   Supervising Provider    Answer:   Cassandria Anger [[id number]]    Follow-up: Return in about 2 weeks (around 09/13/2016), or if symptoms worsen or fail to improve, for CHF and HTN.  Wilfred Lacy, NP

## 2016-08-31 ENCOUNTER — Encounter: Payer: Self-pay | Admitting: Nurse Practitioner

## 2016-08-31 ENCOUNTER — Other Ambulatory Visit: Payer: Self-pay | Admitting: Nurse Practitioner

## 2016-08-31 ENCOUNTER — Telehealth: Payer: Self-pay | Admitting: Nurse Practitioner

## 2016-08-31 DIAGNOSIS — R0609 Other forms of dyspnea: Secondary | ICD-10-CM

## 2016-08-31 DIAGNOSIS — I5032 Chronic diastolic (congestive) heart failure: Secondary | ICD-10-CM

## 2016-08-31 LAB — CULTURE, BLOOD (ROUTINE X 2)
Culture: NO GROWTH
Culture: NO GROWTH

## 2016-08-31 MED ORDER — SACUBITRIL-VALSARTAN 24-26 MG PO TABS: 1.0000 | ORAL_TABLET | Freq: Two times a day (BID) | ORAL | 1 refills | Status: DC

## 2016-08-31 MED ORDER — FUROSEMIDE 20 MG PO TABS: 20.0000 mg | ORAL_TABLET | Freq: Every day | ORAL | 0 refills | Status: DC

## 2016-08-31 NOTE — Telephone Encounter (Signed)
Patient advised but has several questions about activities allowed and exactly what is going on with her heart---I read chest xray to her and tried to explain interstitital edema in heart---she is wanting to know if its related to heart muscle or heart valve problem and if there are any restrictions she should do/know until she sees cardiologist---you can send mychart message or I can call her back with more detailed information---please advise, thanks

## 2016-08-31 NOTE — Assessment & Plan Note (Signed)
hest x-ray indicates cardiomegaly and mild interstitial edema. BNP indicates an increase from the 200s to 600s. CBC indicates mild leukocytosis. Normal lipase.  Sent additional prescription for Lasix 20 mg daily. Also to start Entresto BID  Ordered referral to cardiologist and for echocardiogram.  Patient instructed to go to the emergency room if symptoms worsen prior to cardiology appointment.

## 2016-08-31 NOTE — Telephone Encounter (Signed)
Patient states she was already taking 20mg  lasix currently---do you want her to increase by 20mg  for a total of 40mg  daily---or were you just sending in another rx in case she needed more pills---please advise, I will call her back, thanks

## 2016-08-31 NOTE — Telephone Encounter (Signed)
Patient called in about her chest xray and lab results. Can you please follow up with patient. Thank you.

## 2016-08-31 NOTE — Progress Notes (Signed)
Chest x-ray indicates cardiomegaly with interstitial edema. BNP was elevated. Sent additional prescription for Lasix. Ordered referral for cardiology and echocardiogram.

## 2016-08-31 NOTE — Telephone Encounter (Signed)
Interstitial edema is due to CHF exacerbation. Congestive heart failure is as a result of dysfunction of the heart muscles which can result in fluid accumulation in the lungs, abdomen and lower extremities. Fluid accumulation can lead to shortness of breath, weight gain and abdominal fullness. Symptoms are managed with the use of Lasix and and blood pressure medications like metoprolol and Entresto. There is no activity restrictions at this time. She is to continue with daily activities as tolerated by cardiology and echocardiogram. Due to increase Lasix dosage and the start of new medication-Entresto; I recommend she makes a 1 week follow-up appointment with me or Terri Piedra.

## 2016-08-31 NOTE — Telephone Encounter (Signed)
According to her chart Lasix was prescribed to be used as needed for leg swelling. If she is currently taking Lasix 20 mg daily, she should maintain that dose. Thank you

## 2016-08-31 NOTE — Patient Instructions (Addendum)
Chest x-ray indicates cardiomegaly and mild interstitial edema. BMP indicates an increase from the 200s to 600s. CBC indicates mild leukocytosis. Normal lipase.  Sent additional prescription for Lasix 20 mg daily. Also to start Entresto BID  Ordered referral to cardiologist and for echocardiogram.  Patient instructed to go to the emergency room if symptoms worsen prior to cardiology appointment.

## 2016-09-01 ENCOUNTER — Ambulatory Visit (INDEPENDENT_AMBULATORY_CARE_PROVIDER_SITE_OTHER): Payer: BC Managed Care – PPO | Admitting: Cardiology

## 2016-09-01 VITALS — BP 124/74 | HR 72 | Ht 62.0 in | Wt >= 6400 oz

## 2016-09-01 DIAGNOSIS — Z6841 Body Mass Index (BMI) 40.0 and over, adult: Secondary | ICD-10-CM

## 2016-09-01 DIAGNOSIS — E784 Other hyperlipidemia: Secondary | ICD-10-CM

## 2016-09-01 DIAGNOSIS — R600 Localized edema: Secondary | ICD-10-CM | POA: Diagnosis not present

## 2016-09-01 DIAGNOSIS — I10 Essential (primary) hypertension: Secondary | ICD-10-CM

## 2016-09-01 DIAGNOSIS — E7849 Other hyperlipidemia: Secondary | ICD-10-CM

## 2016-09-01 DIAGNOSIS — I509 Heart failure, unspecified: Secondary | ICD-10-CM | POA: Diagnosis not present

## 2016-09-01 DIAGNOSIS — I5043 Acute on chronic combined systolic (congestive) and diastolic (congestive) heart failure: Secondary | ICD-10-CM

## 2016-09-01 MED ORDER — LOSARTAN POTASSIUM 25 MG PO TABS: 25.0000 mg | ORAL_TABLET | Freq: Every day | ORAL | 3 refills | Status: DC

## 2016-09-01 MED ORDER — FUROSEMIDE 40 MG PO TABS: 40.0000 mg | ORAL_TABLET | Freq: Two times a day (BID) | ORAL | 3 refills | Status: DC

## 2016-09-01 NOTE — Progress Notes (Signed)
Cardiology Office Note    Date:  09/01/2016   ID:  Alexandra Beck, DOB [birth year], MRN [id number]  PCP:  Mauricio Po, FNP  Cardiologist:   Ena Dawley, MD   Chief complain: To establish cardiology care for newly diagnosed congestive heart failure.  History of Present Illness:  Alexandra Beck is a 35 y.o. female with past medical history of morbid obesity (BMI 88), physical inactivity, hypertension, hyperlipidemia, hypothyroidism who has been told several years ago.she has diastolic heart failure. She has been using a low-dose of furosemide without significant improvement. She has been having recurrent lower extremity cellulitis for which she has been hospitalized in the past. On 08/27/2016 she presented to the ER with right lower extremity cellulitis with leukocytosis and fever. She was also diagnosed with acute on chronic combined systolic and diastolic CHF and referred to [place]. The patient is minimally active, she works as a Herbalist exercise oh walk at all. She has been compliant to her medicines and doesn't see any side effects. She has a lot of questions regarding her condition and prognosis. She describes proximal nocturnal dyspnea and increased abdominal girth and fullness. She denies any palpitations or syncope.  Past Medical History:  Diagnosis Date  . Anemia   . CARDIOMYOPATHY, DILATED   . Chronic kidney disease   . Depression   . Diabetes mellitus ([place])   . GERD (gastroesophageal reflux disease)   . Hyperlipidemia   . Hypertension   . Hypothyroidism   . Morbid obesity ([place])   . Morbid obesity with BMI of 70 and over, adult ([place])   . Obesity hypoventilation syndrome ([place])   . OSA (obstructive sleep apnea)   . Recurrent cellulitis of lower leg    LLE, venous insuff  . Sepsis ([place]) 11/2012   Secondary to cellulitis  . Venous insufficiency of leg    Past Surgical History:  Procedure Laterality Date  . WISDOM TOOTH EXTRACTION      Current  Medications: Outpatient Medications Prior to Visit  Medication Sig Dispense Refill  . JARDIANCE 10 MG TABS tablet TAKE 1 TABLET BY MOUTH DAILY 30 tablet 0  . levothyroxine (SYNTHROID, LEVOTHROID) 150 MCG tablet TAKE 1 TABLET BY MOUTH EVERY MORNING BEFORE BREAKFAST 30 tablet 0  . metoprolol succinate (TOPROL-XL) 50 MG 24 hr tablet TAKE 1 TABLET BY MOUTH EVERY DAY WITH OR FOLLOWING A MEAL 30 tablet 0  . PREVIDENT 5000 BOOSTER PLUS 1.1 % PSTE BRUSH ON TEETH BID FOR 2 MINS EACH TIME. EXPECTORATE AS MUCH AS POSSIBLE THEN DO NOT SWISH EAT OR DRINK FOR 30 MINS AFTER TREATMENT  0  . rosuvastatin (CRESTOR) 10 MG tablet TAKE 1 TABLET BY MOUTH EVERY DAY 30 tablet 0  . furosemide (LASIX) 20 MG tablet Take 1 tablet (20 mg total) by mouth daily. 30 tablet 0  . HYDROcodone-acetaminophen (NORCO/VICODIN) 5-325 MG tablet Take 1-2 tablets by mouth every 4 (four) hours as needed for moderate pain. (Patient not taking: Reported on 09/01/2016) 10 tablet 0  . pantoprazole (PROTONIX) 40 MG tablet     . sacubitril-valsartan (ENTRESTO) 24-26 MG Take 1 tablet by mouth 2 (two) times daily. (Patient not taking: Reported on 09/01/2016) 60 tablet 1   No facility-administered medications prior to visit.      Allergies:   Aspirin; Doxycycline; Lisinopril; and Niaspan [niacin er]   Social History   Social History  . Marital status: Single    Spouse name: N/A  . Number of children: N/A  .  Years of education: N/A   Occupational History  . 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 Smoking status: Former Smoker    Years: 0.00  . Smokeless tobacco: Never Used  . Alcohol use No  . Drug use: No  . Sexual activity: Not on file   Other Topics Concern  . 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   ROS:   Please see the history of  present illness.    ROS All other systems reviewed and are negative.   PHYSICAL EXAM:   VS:  BP 124/74   Pulse 72   Ht 5\' 2"  (1.575 m)   Wt (!) 466 lb (211.4 kg)   LMP 08/12/2016 (Approximate)   BMI 85.23 kg/m    GEN: Morbidly obese, in no acute distress  HEENT: normal  Neck: no JVD, carotid bruits, or masses Cardiac: RRR; no murmurs, rubs, or gallops, bilateral lower extremity edema with increased warmth and erythema in both of her calves. Respiratory:  There are crackles at both lung bases, normal work of breathing GI: soft, nontender, nondistended, + BS MS: no deformity or atrophy  Skin: warm and dry, no rash Neuro:  Alert and Oriented x 3, Strength and sensation are intact Psych: euthymic mood, full affect  Wt Readings from Last 3 Encounters:  09/01/16 (!) 466 lb (211.4 kg)  08/30/16 (!) 470 lb (213.2 kg)  08/26/16 (!) 462 lb 11.2 oz (209.9 kg)    Studies/Labs Reviewed:   EKG:  EKG is ordered today.  The ekg ordered today demonstrates Sinus rhythm, low-voltage QRS, otherwise normal EKG.  Recent Labs: 03/02/2016: TSH 3.83 08/28/2016: BUN 20; Creatinine, Ser 1.32; Potassium 4.3; Sodium 137 08/30/2016: ALT 17; Hemoglobin 12.4; Platelets 262.0; Pro B Natriuretic peptide (BNP) 641.0   Lipid Panel    Component Value Date/Time   CHOL 228 (H) 03/02/2016 1101   TRIG 295.0 (H) 03/02/2016 1101   HDL 41.10 03/02/2016 1101   CHOLHDL 6 03/02/2016 1101   VLDL 59.0 (H) 03/02/2016 1101   LDLCALC 92 07/15/2013 1030   LDLDIRECT 141.0 03/02/2016 1101   TTE: 07/24/2014 - Left ventricle: The cavity size was dilated. Wall thickness was increased. Systolic function was probably normal. The estimated ejection fraction was = 50%. - Left atrium: The atrium was dilated. - Right ventricle: Systolic function was reduced. - Right atrium: The atrium was dilated. - Pericardium, extracardiac: A small pericardial effusion was identified.   ASSESSMENT:    1. Other hyperlipidemia   2.  Leg edema, left   3. Essential hypertension   4. Congestive heart failure, unspecified congestive heart failure chronicity, unspecified congestive heart failure type ([place])   5. Acute on chronic combined systolic and diastolic CHF, NYHA class 3 ([place])   6. Morbid obesity ([place])   7. BMI 70 and over, adult [facility])      PLAN:  In order of problems listed above:  1. Acute on chronic combined systolic and diastolic CHF, last LVEF of 50% in 2015, the echo was of poor quality she scheduled for another one in a week for use of echo contrast. For now she is clearly fluid overloaded with lower extremity edema and some crackles in her lungs, it's difficult to assess fullness of her belly in the patient with 460 pounds weight.  For now I will increase her Lasix to 40 mg by mouth twice a day from current 20 mg daily. I am going to discontinue Delene Loll is that only applies the patient with LVEF of less than 40%, she also has a blood pressure 120 and probably wouldn't tolerate at this point. She has a history of cough with lisinopril going to start losartan 25 mg daily. 2. Essential hypertension adding losartan 25 mg daily. 3. Morbid obesity, BMI over 80, patient is completely inactive, referral to bariatric center has been discussed, her concern is that the classes are usually in the afternoon she has to learn every afternoon. We are going to discuss it again at the next visit try to refer her to see what her options are. Without significant weight loss in the near future her prognosis is very poor. 4. Hyperlipidemia - On rosuvastatin is well tolerated, I would continue.   Medication Adjustments/Labs and Tests Ordered: Current medicines are reviewed at length with the patient today.  Concerns regarding medicines are outlined above.  Medication changes, Labs and Tests ordered today are listed in the Patient Instructions below. Patient Instructions  Medication Instructions:   STOP TAKING ENTRESTO NOW  START  TAKING LOSARTAN 25 MG BY MOUTH DAILY  INCREASE YOUR LASIX TO 40 MG BY MOUTH TWICE DAILY (TAKE ONE DOSE AT 8 AM AND THE OTHER DOSE AT 2 PM).    Follow-Up:  DR Meda Coffee ON HER QUARTER DAY (ADD ON PER DR Adlai Nieblas) FOR October 07, 2016.       If you need a refill on your cardiac medications before your next appointment, please call your pharmacy.      Signed, Ena Dawley, MD  09/01/2016 3:22 PM    [facility] [address], [place], [place]  [address] Phone: [phone]; Fax: [phone]

## 2016-09-01 NOTE — Patient Instructions (Signed)
Medication Instructions:   STOP TAKING ENTRESTO NOW  START TAKING LOSARTAN 25 MG BY MOUTH DAILY  INCREASE YOUR LASIX TO 40 MG BY MOUTH TWICE DAILY (TAKE ONE DOSE AT 8 AM AND THE OTHER DOSE AT 2 PM).    Follow-Up:  DR Meda Coffee ON HER QUARTER DAY (ADD ON PER DR NELSON) FOR October 07, 2016.       If you need a refill on your cardiac medications before your next appointment, please call your pharmacy.

## 2016-09-01 NOTE — Telephone Encounter (Signed)
Advised patient of charlottes note---she is taking lasix daily (20mg )--patient advised to keep taking this dose daily as charlotte advised

## 2016-09-08 ENCOUNTER — Other Ambulatory Visit (HOSPITAL_COMMUNITY): Payer: BC Managed Care – PPO

## 2016-09-08 ENCOUNTER — Telehealth (HOSPITAL_COMMUNITY): Payer: Self-pay | Admitting: Nurse Practitioner

## 2016-09-09 ENCOUNTER — Ambulatory Visit: Payer: BC Managed Care – PPO | Admitting: Nurse Practitioner

## 2016-09-09 NOTE — Telephone Encounter (Signed)
Close encounter 

## 2016-09-15 ENCOUNTER — Other Ambulatory Visit: Payer: Self-pay

## 2016-09-15 ENCOUNTER — Ambulatory Visit (HOSPITAL_COMMUNITY): Payer: BC Managed Care – PPO | Attending: Nurse Practitioner

## 2016-09-15 DIAGNOSIS — I5032 Chronic diastolic (congestive) heart failure: Secondary | ICD-10-CM | POA: Insufficient documentation

## 2016-09-15 DIAGNOSIS — I351 Nonrheumatic aortic (valve) insufficiency: Secondary | ICD-10-CM | POA: Diagnosis not present

## 2016-09-15 DIAGNOSIS — I10 Essential (primary) hypertension: Secondary | ICD-10-CM | POA: Insufficient documentation

## 2016-09-15 DIAGNOSIS — I517 Cardiomegaly: Secondary | ICD-10-CM | POA: Insufficient documentation

## 2016-09-15 DIAGNOSIS — I071 Rheumatic tricuspid insufficiency: Secondary | ICD-10-CM | POA: Diagnosis not present

## 2016-09-15 DIAGNOSIS — R0609 Other forms of dyspnea: Secondary | ICD-10-CM | POA: Insufficient documentation

## 2016-09-15 MED ORDER — PERFLUTREN LIPID MICROSPHERE
2.0000 mL | INTRAVENOUS | Status: AC | PRN
  Administered 2016-09-15: 2 mL via INTRAVENOUS

## 2016-09-16 ENCOUNTER — Ambulatory Visit: Payer: BC Managed Care – PPO | Admitting: Nurse Practitioner

## 2016-09-16 ENCOUNTER — Other Ambulatory Visit (INDEPENDENT_AMBULATORY_CARE_PROVIDER_SITE_OTHER): Payer: BC Managed Care – PPO

## 2016-09-16 ENCOUNTER — Ambulatory Visit (INDEPENDENT_AMBULATORY_CARE_PROVIDER_SITE_OTHER): Payer: BC Managed Care – PPO | Admitting: Nurse Practitioner

## 2016-09-16 ENCOUNTER — Encounter: Payer: Self-pay | Admitting: Nurse Practitioner

## 2016-09-16 VITALS — BP 138/78 | HR 86 | Temp 97.6°F | Ht 62.0 in | Wt >= 6400 oz

## 2016-09-16 DIAGNOSIS — I5032 Chronic diastolic (congestive) heart failure: Secondary | ICD-10-CM | POA: Diagnosis not present

## 2016-09-16 LAB — BASIC METABOLIC PANEL
BUN: 20 mg/dL (ref 6–23)
CALCIUM: 10.1 mg/dL (ref 8.4–10.5)
CHLORIDE: 100 meq/L (ref 96–112)
CO2: 36 meq/L — AB (ref 19–32)
CREATININE: 0.94 mg/dL (ref 0.40–1.20)
GFR: 71.5 mL/min (ref >60.00)
GLUCOSE: 114 mg/dL — AB (ref 70–99)
Potassium: 4.5 mEq/L (ref 3.5–5.1)
SODIUM: 141 meq/L (ref 135–145)

## 2016-09-16 NOTE — Patient Instructions (Signed)
Continue low salt diet and current medications.

## 2016-09-16 NOTE — Progress Notes (Signed)
Pre visit review using our clinic review tool, if applicable. No additional management support is needed unless otherwise documented below in the visit note. 

## 2016-09-16 NOTE — Progress Notes (Signed)
Subjective:  Patient ID: Alexandra Beck, female    DOB: 07/31/1981  Age: 35 y.o. MRN: [id number]  CC: Shortness of Breath (follow up from chf exacerbation 2 weeks ago, improved SOB and edema)   Shortness of Breath  This is a new problem. The current episode started 1 to 4 weeks ago. The problem occurs intermittently. The problem has been gradually worsening. Associated symptoms include leg swelling. Pertinent negatives include no abdominal pain, chest pain, orthopnea, PND, sputum production or wheezing. The symptoms are aggravated by any activity. Associated symptoms comments: Decreased bilateral LE edema. Lasix increased to BID and started on losartan by cardiologist.. Treatments tried: lasix and losartan. The treatment provided significant relief. Her past medical history is significant for a heart failure.  Echocardiogram done 09/15/16: EF 55%, trivial aortic valve regurgitation, mild LVH, normal wall motion. Has f/up appt with cardiologist 10/31/16.  Outpatient Medications Prior to Visit  Medication Sig Dispense Refill  . furosemide (LASIX) 40 MG tablet Take 1 tablet (40 mg total) by mouth 2 (two) times daily. 180 tablet 3  . JARDIANCE 10 MG TABS tablet TAKE 1 TABLET BY MOUTH DAILY 30 tablet 0  . levothyroxine (SYNTHROID, LEVOTHROID) 150 MCG tablet TAKE 1 TABLET BY MOUTH EVERY MORNING BEFORE BREAKFAST 30 tablet 0  . losartan (COZAAR) 25 MG tablet Take 1 tablet (25 mg total) by mouth daily. 90 tablet 3  . metoprolol succinate (TOPROL-XL) 50 MG 24 hr tablet TAKE 1 TABLET BY MOUTH EVERY DAY WITH OR FOLLOWING A MEAL 30 tablet 0  . PREVIDENT 5000 BOOSTER PLUS 1.1 % PSTE BRUSH ON TEETH BID FOR 2 MINS EACH TIME. EXPECTORATE AS MUCH AS POSSIBLE THEN DO NOT SWISH EAT OR DRINK FOR 30 MINS AFTER TREATMENT  0  . rosuvastatin (CRESTOR) 10 MG tablet TAKE 1 TABLET BY MOUTH EVERY DAY 30 tablet 0   No facility-administered medications prior to visit.     ROS See HPI  Objective:  BP 138/78 (BP  Location: Left Arm, Patient Position: Sitting, Cuff Size: Large)   Pulse 86   Temp 97.6 F (36.4 C)   Ht 5\' 2"  (1.575 m)   Wt (!) 443 lb (200.9 kg)   LMP 08/12/2016 (Approximate)   SpO2 96%   BMI 81.03 kg/m   BP Readings from Last 3 Encounters:  09/16/16 138/78  09/01/16 124/74  08/30/16 (!) 152/90    Wt Readings from Last 3 Encounters:  09/16/16 (!) 443 lb (200.9 kg)  09/01/16 (!) 466 lb (211.4 kg)  08/30/16 (!) 470 lb (213.2 kg)    Physical Exam  Constitutional: She is oriented to person, place, and time.  Cardiovascular: Normal rate, regular rhythm and normal heart sounds.   Pulmonary/Chest: Effort normal and breath sounds normal.  Abdominal: Soft. Bowel sounds are normal. There is no tenderness.  Musculoskeletal: She exhibits edema. She exhibits no tenderness.  Bilateral LE edema (improved)  Neurological: She is alert and oriented to person, place, and time.  Skin: Skin is warm and dry. No erythema.  Vitals reviewed.   Lab Results  Component Value Date   WBC 12.2 (H) 08/30/2016   HGB 12.4 08/30/2016   HCT 38.1 08/30/2016   PLT 262.0 08/30/2016   GLUCOSE 114 (H) 09/16/2016   CHOL 228 (H) 03/02/2016   TRIG 295.0 (H) 03/02/2016   HDL 41.10 03/02/2016   LDLDIRECT 141.0 03/02/2016   LDLCALC 92 07/15/2013   ALT 17 08/30/2016   AST 13 08/30/2016   NA 141 09/16/2016   K  4.5 09/16/2016   CL 100 09/16/2016   CREATININE 0.94 09/16/2016   BUN 20 09/16/2016   CO2 36 (H) 09/16/2016   TSH 3.83 03/02/2016   INR 1.18 01/07/2013   HGBA1C 6.9 (H) 03/02/2016   MICROALBUR 8.05 (H) 06/26/2007    Dg Chest 2 View  Result Date: 08/30/2016 CLINICAL DATA:  Shortness of breath on exertion, nausea and epigastric fullness for 4 days. EXAM: CHEST  2 VIEW COMPARISON:  Single-view of the chest 08/26/2016. PA and lateral chest 08/26/2015. FINDINGS: There is cardiomegaly and mild interstitial edema. No consolidative process, pneumothorax or effusion. IMPRESSION: Cardiomegaly and mild  interstitial edema. Electronically Signed   By: Inge Rise M.D.   On: 08/30/2016 14:48    Assessment & Plan:   [place] was seen today for shortness of breath.  Diagnoses and all orders for this visit:  Chronic diastolic congestive heart failure ([place]) -     Cancel: B Nat Peptide; Future -     Basic Metabolic Panel (BMET); Future   I am having Ms. Kyles maintain her metoprolol succinate, JARDIANCE, levothyroxine, rosuvastatin, PREVIDENT 5000 BOOSTER PLUS, furosemide, and losartan.  No orders of the defined types were placed in this encounter.  BMP Latest Ref Rng & Units 09/16/2016 08/28/2016 08/27/2016  Glucose 70 - 99 mg/dL 114(H) 104(H) 138(H)  BUN 6 - 23 mg/dL 20 20 20   Creatinine 0.40 - 1.20 mg/dL 0.94 1.32(H) 1.26(H)  Sodium 135 - 145 mEq/L 141 137 133(L)  Potassium 3.5 - 5.1 mEq/L 4.5 4.3 4.1  Chloride 96 - 112 mEq/L 100 102 101  CO2 19 - 32 mEq/L 36(H) 28 26  Calcium 8.4 - 10.5 mg/dL 10.1 9.0 8.2(L)    Follow-up: Return in about 3 months (around 12/17/2016) for chronic conditions with Terri Piedra.  Wilfred Lacy, NP

## 2016-09-20 ENCOUNTER — Other Ambulatory Visit: Payer: Self-pay | Admitting: Family

## 2016-09-20 DIAGNOSIS — E039 Hypothyroidism, unspecified: Secondary | ICD-10-CM

## 2016-09-22 ENCOUNTER — Encounter: Payer: Self-pay | Admitting: Cardiology

## 2016-09-23 ENCOUNTER — Other Ambulatory Visit: Payer: Self-pay | Admitting: Emergency Medicine

## 2016-09-23 MED ORDER — ROSUVASTATIN CALCIUM 10 MG PO TABS
10.0000 mg | ORAL_TABLET | Freq: Every day | ORAL | 0 refills | Status: DC
Start: 2016-09-23 — End: 2016-10-25

## 2016-09-25 ENCOUNTER — Encounter: Payer: Self-pay | Admitting: Family

## 2016-10-02 ENCOUNTER — Encounter: Payer: Self-pay | Admitting: Nurse Practitioner

## 2016-10-04 ENCOUNTER — Telehealth: Payer: Self-pay | Admitting: Cardiology

## 2016-10-04 NOTE — Telephone Encounter (Signed)
Left a message for the pt to call back.  

## 2016-10-04 NOTE — Telephone Encounter (Signed)
New Message:    Pt wants to see if appointment on 10-07-16 is necessary. She wants to know if a phone call or communicating on my chart would cover what needs to be discussed.

## 2016-10-07 ENCOUNTER — Ambulatory Visit (INDEPENDENT_AMBULATORY_CARE_PROVIDER_SITE_OTHER): Payer: BC Managed Care – PPO | Admitting: Cardiology

## 2016-10-07 ENCOUNTER — Ambulatory Visit: Payer: BC Managed Care – PPO | Admitting: Cardiology

## 2016-10-07 ENCOUNTER — Encounter: Payer: Self-pay | Admitting: Cardiology

## 2016-10-07 DIAGNOSIS — E784 Other hyperlipidemia: Secondary | ICD-10-CM

## 2016-10-07 DIAGNOSIS — R600 Localized edema: Secondary | ICD-10-CM | POA: Diagnosis not present

## 2016-10-07 DIAGNOSIS — I10 Essential (primary) hypertension: Secondary | ICD-10-CM | POA: Diagnosis not present

## 2016-10-07 DIAGNOSIS — I509 Heart failure, unspecified: Secondary | ICD-10-CM

## 2016-10-07 DIAGNOSIS — E7849 Other hyperlipidemia: Secondary | ICD-10-CM

## 2016-10-07 MED ORDER — POTASSIUM CHLORIDE CRYS ER 20 MEQ PO TBCR: 20.0000 meq | EXTENDED_RELEASE_TABLET | Freq: Every day | ORAL | 5 refills | Status: DC

## 2016-10-07 MED ORDER — METOLAZONE 2.5 MG PO TABS: ORAL_TABLET | ORAL | 0 refills | Status: DC

## 2016-10-07 MED ORDER — FUROSEMIDE 40 MG PO TABS: 60.0000 mg | ORAL_TABLET | Freq: Two times a day (BID) | ORAL | 5 refills | Status: DC

## 2016-10-07 NOTE — Telephone Encounter (Signed)
Spoke with the pt to inform her that she should come in for her rescheduled appt for today at 2 pm with Ellen Henri PA-C. Pt states she is coming into this appt today, at 2 pm.  Pt apologized for not being able to make it in to see Dr Meda Coffee this morning, so a scheduler worked her in with [place].  Pt verbalized understanding and agrees with this plan.  Pt gracious for the follow-up.

## 2016-10-07 NOTE — Progress Notes (Signed)
10/07/2016 Alexandra Beck   [birth year]  [id number]  Primary Physician Mauricio Po, [place] Primary Cardiologist: Dr. Meda Coffee    Reason for Visit/CC: Diastolic CHF  HPI:  Alexandra Beck is a 35 y.o. female with past medical history of morbid obesity (BMI 88), physical inactivity, hypertension, hyperlipidemia, hypothyroidism. She was recently referred to Dr. Meda Coffee as a new patient to establish cardiovascular care given newly diagnosed congestive heart failure. She has been having recurrent lower extremity cellulitis for which she has been hospitalized in the past. On 08/27/2016 she presented to the ER with right lower extremity cellulitis with leukocytosis and fever. She was also diagnosed with acute on chronic combine diastolic CHF and referred to [place]. Echocardiogram 09/15/2016 revealed an ejection fraction of 55%. Wall motion was normal. Before being sent to our office, she was placed on Entresto for HF. When Dr. Meda Coffee saw her, she discontinued Entresto, as this is only for systolic HF with an EF A999333. This was replaced with Losartan 25 mg daily. Dr. Meda Coffee also increased her lasix to 40 mg BID given findings of volume overload (crackles on lung exam).  She now presents back to clinic for f/u. She is tearful. She is frustrated at her lack of improvement. She notes that when she initially was placed on the increased dose of Lasix,  she noticed significant improvement at first. She had good urine output. Her weight was improving as well as her dyspnea lower extremity edema. However, shortly after that she started to notice progressive weight gain and worsening lower extremity edema. She also notes that her urine output over the last week or so has decreased despite full compliance with her diuretic. She has limited her sodium. She's been taking her blood pressure medications as instructed. Her weight in clinic today is 455 pounds. When she saw Dr. Meda Coffee, her weight was 466, however was 443 on 10/20.  Initial blood pressure today was 158/98. This was rechecked and was 140/88. PCP recently obtained follow-up labs which were performed 2 weeks after her Lasix adjustment. Renal function was normal. Potassium was also normal at 4.4   Current Meds  Medication Sig  . furosemide (LASIX) 40 MG tablet Take 1.5 tablets (60 mg total) by mouth 2 (two) times daily.  Marland Kitchen JARDIANCE 10 MG TABS tablet TAKE 1 TABLET BY MOUTH DAILY  . levothyroxine (SYNTHROID, LEVOTHROID) 150 MCG tablet TAKE 1 TABLET BY MOUTH EVERY MORNING BEFORE BREAKFAST  . losartan (COZAAR) 25 MG tablet Take 1 tablet (25 mg total) by mouth daily.  . metoprolol succinate (TOPROL-XL) 50 MG 24 hr tablet TAKE 1 TABLET BY MOUTH EVERY DAY WITH OR FOLLOWING A MEAL  . PREVIDENT 5000 BOOSTER PLUS 1.1 % PSTE BRUSH ON TEETH BID FOR 2 MINS EACH TIME. EXPECTORATE AS MUCH AS POSSIBLE THEN DO NOT SWISH EAT OR DRINK FOR 30 MINS AFTER TREATMENT  . rosuvastatin (CRESTOR) 10 MG tablet Take 1 tablet (10 mg total) by mouth daily. --- must have labs before further refills.  . [DISCONTINUED] furosemide (LASIX) 40 MG tablet Take 1 tablet (40 mg total) by mouth 2 (two) times daily.   Allergies  Allergen Reactions  . Aspirin     REACTION: throat swelling, hives  . Doxycycline Other (See Comments)    Abdominal pain  . Lisinopril Cough  . Niaspan [Niacin Er]     Caused flushing   Past Medical History:  Diagnosis Date  . Anemia   . CARDIOMYOPATHY, DILATED   . Chronic kidney disease   .  Depression   . Diabetes mellitus ([place])   . GERD (gastroesophageal reflux disease)   . Hyperlipidemia   . Hypertension   . Hypothyroidism   . Morbid obesity ([place])   . Morbid obesity with BMI of 70 and over, adult ([place])   . Obesity hypoventilation syndrome ([place])   . OSA (obstructive sleep apnea)   . Recurrent cellulitis of lower leg    LLE, venous insuff  . Sepsis ([place]) 11/2012   Secondary to cellulitis  . Venous insufficiency of leg    Family History  Problem Relation  Age of Onset  . Hypertension Mother   . Hypertension Father   . Heart disease Father     before age 58   Past Surgical History:  Procedure Laterality Date  . WISDOM TOOTH EXTRACTION     Social History   Social History  . Marital status: Single    Spouse name: N/A  . Number of children: N/A  . Years of education: N/A   Occupational History  . 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 Smoking status: Former Smoker    Years: 0.00  . Smokeless tobacco: Never Used  . Alcohol use No  . Drug use: No  . Sexual activity: Not on file   Other Topics Concern  . 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  Cardiovascular: negative for chest pain, dyspnea on exertion, edema, orthopnea, palpitations, paroxysmal nocturnal dyspnea or shortness of breath Dermatological: negative for rash Respiratory: negative for cough or wheezing Urologic: negative for hematuria Abdominal: negative for nausea, vomiting, diarrhea, bright red blood per rectum, melena, or hematemesis Neurologic: negative for visual changes, syncope, or dizziness All other systems reviewed and are otherwise negative except as noted above.   Physical Exam:  Blood pressure (!) 158/98, pulse 74, height 5\' 2"  (1.575 m), weight (!) 455 lb 1.9 oz (206.4 kg), SpO2 94 %.  GEN: Morbidly obese, in no acute distress  HEENT: normal  Neck: no JVD, carotid bruits, or masses Cardiac: RRR; no murmurs, rubs, or gallops, bilateral lower extremity edema, wearing compressions stockings bilaterally. Respiratory: normal work of breathing. CTAB GI: obese, soft, nontender, nondistended, + BS MS: no deformity or atrophy  Skin: warm and dry, no rash Neuro:  Alert and Oriented x 3, Strength and sensation are intact Psych: grossly normal  EKG not preformed   ASSESSMENT AND PLAN:   1.  Acute on chronic combined systolic and diastolic CHF: Echo A999333 showed  normal LVF with EF of 55%. Still volume overloaded. Weight increasing back up. LEE increasing. No resting dyspnea. Pt notes initial good response to increase in lasix but UOP has slowed over the last week. F/u BMP after increasing lasix to BID showed stable renal function and K. SCr at 0.94 and K at 4.5. We will increase her lasix further to 60 mg BID and will give trial of metolazone, 2.5 mg 30 min before first lasix dose x 3 days, to be taken every other day. Only 3 tablets ordered. Will try this for 1 week. Repeat BMP after 1 week. F/u to assess response. Hopefully this will enhance diureses and help with volume and BP.   2. Essential hypertension: Losartan 25 mg daily added at last OV. BP still mildly elevated. We are adjusting diuretics today. F/u on BP in 1 week.  3. Morbid obesity: BMI over 80, patient is completely inactive, referral to bariatric center has been discussed by Dr. Meda Coffee at last [place]. Pt busy with school. Not considering at this time. Will revisit idea at next f/u. Without significant weight loss in the near future her prognosis is very poor.  4.  Hyperlipidemia - on statin therapy with Crestor.  PLAN f/u in 1-2 weeks for repeat assessment.   Lyda Jester PA-C 10/07/2016 4:27 PM

## 2016-10-07 NOTE — Patient Instructions (Addendum)
Medication Instructions:  Your physician has recommended you make the following change in your medication:  1) INCREASE Lasix to 60mg  twice daily 2) START Potassium 57meq daily 3) START Metolazone 2.5 mg every other day for 3 doses. Take 30 minutes before taking Lasix   Labwork: Your physician recommends that you return for lab work in: 1 week (Bmet)   Testing/Procedures: None ordered  Follow-Up: Your physician recommends that you schedule a follow-up appointment in: 2 weeks with Egbert Garibaldi, PA-C   Any Other Special Instructions Will Be Listed Below (If Applicable).      If you need a refill on your cardiac medications before your next appointment, please call your pharmacy.

## 2016-10-18 ENCOUNTER — Encounter: Payer: Self-pay | Admitting: Cardiology

## 2016-10-19 ENCOUNTER — Other Ambulatory Visit: Payer: Self-pay | Admitting: Family

## 2016-10-19 DIAGNOSIS — E039 Hypothyroidism, unspecified: Secondary | ICD-10-CM

## 2016-10-24 ENCOUNTER — Ambulatory Visit: Payer: BC Managed Care – PPO | Admitting: Cardiology

## 2016-10-25 ENCOUNTER — Other Ambulatory Visit: Payer: Self-pay | Admitting: *Deleted

## 2016-10-25 MED ORDER — ROSUVASTATIN CALCIUM 10 MG PO TABS: 10.0000 mg | ORAL_TABLET | Freq: Every day | ORAL | 1 refills | Status: DC

## 2016-11-03 ENCOUNTER — Telehealth: Payer: Self-pay | Admitting: Cardiology

## 2016-11-03 DIAGNOSIS — I509 Heart failure, unspecified: Secondary | ICD-10-CM

## 2016-11-03 NOTE — Telephone Encounter (Signed)
°*  STAT* If patient is at the pharmacy, call can be transferred to refill team.   1. Which medications need to be refilled? (please list name of each medication and dose if known) Metolazone   2. Which pharmacy/location (including street and city if local pharmacy) is medication to be sent to?[facility] on [address]   3. Do they need a 30 day or 90 day supply? [place]

## 2016-11-03 NOTE — Telephone Encounter (Signed)
When this was ordered, it was only ordered for three doses as below Patient Instructions   Medication Instructions:  Your physician has recommended you make the following change in your medication:  1) INCREASE Lasix to 60mg  twice daily 2) START Potassium 70meq daily 3) START Metolazone 2.5 mg every other day for 3 doses. Take 30 minutes before taking Lasix   Patient is requesting that this be refilled. Please advise. Thanks, MI

## 2016-11-03 NOTE — Telephone Encounter (Signed)
According to note below per Ellen Henri from 11/10, the pt was to take her increased lasix and trial of metolazone x 3 days and follow-up with her on 11/27  to assess response, with a BMET at that visit.    Pt called and cancelled her appt on 11/27 and stated to the operator that she would call back to r/s at a later date.    Pt was then rescheduled to see [name] Copa on tomorrow 12/8 to reassess this need again and have a bmet drawn, and she called and cancelled this for the second time, and told the operator that she would call back and r/s her appt to a later date.  Pt is now calling into the office to get further refills of her metolazone, for this was only prescribed x 3 days, and she has cancelled 2 appts to assess this and possibly receive more refills.  Tried contacting the pt and no answer received.  Left a detailed message on her VM to call the office back tomorrow and ask to speak with a triage Nurse, to reschedule her appts and further discuss her metolazone.   Will route this to triage for further follow-up.  Will also route this to [place] and Dr Meda Coffee, for their review as well.       1. Acute on chronic combined systolic and diastolic CHF: Echo A999333 showed  normal LVF with EF of 55%. Still volume overloaded. Weight increasing back up. LEE increasing. No resting dyspnea. Pt notes initial good response to increase in lasix but UOP has slowed over the last week. F/u BMP after increasing lasix to BID showed stable renal function and K. SCr at 0.94 and K at 4.5. We will increase her lasix further to 60 mg BID and will give trial of metolazone, 2.5 mg 30 min before first lasix dose x 3 days, to be taken every other day. Only 3 tablets ordered. Will try this for 1 week. Repeat BMP after 1 week. F/u to assess response. Hopefully this will enhance diureses and help with volume and BP.   2. Essential hypertension: Losartan 25 mg daily added at last OV. BP still mildly  elevated. We are adjusting diuretics today. F/u on BP in 1 week.   3. Morbid obesity: BMI over 80, patient is completely inactive, referral to bariatric center has been discussed by Dr. Meda Coffee at last [place]. Pt busy with school. Not considering at this time. Will revisit idea at next f/u. Without significant weight loss in the near future her prognosis is very poor.  4.  Hyperlipidemia - on statin therapy with Crestor.  PLAN f/u in 1-2 weeks for repeat assessment.   Lyda Jester PA-C 10/07/2016 4:27 PM

## 2016-11-04 ENCOUNTER — Ambulatory Visit: Payer: BC Managed Care – PPO | Admitting: Physician Assistant

## 2016-11-07 NOTE — Telephone Encounter (Signed)
Pt rescheduled her follow-up appt now for 12/02/16 at 1000 with Dayna Dunn PA-C.  This was advised to her on 12/7, when she cancelled her previous follow-up appt to discuss further plan after initiation on metolazone and lasix.

## 2016-11-08 ENCOUNTER — Other Ambulatory Visit: Payer: Self-pay | Admitting: Family

## 2016-11-08 ENCOUNTER — Telehealth: Payer: Self-pay | Admitting: Cardiology

## 2016-11-08 DIAGNOSIS — E039 Hypothyroidism, unspecified: Secondary | ICD-10-CM

## 2016-11-08 MED ORDER — METOLAZONE 2.5 MG PO TABS: ORAL_TABLET | ORAL | 0 refills | Status: DC

## 2016-11-08 NOTE — Telephone Encounter (Signed)
Informed the pt that I spoke with Ellen Henri PA-C, and she states that she will refill her metolazone 2.5 mg po every other day, to take 30 mins prior to morning lasix dose (pt to only get 3 tablets), only if the pt will be compliant and come in for a repeat BMET on next Monday 12/18, to reassess renal fx.   Informed the pt that per [place], if she is non-compliant with following through on keeping her lab appt for 12/18, then no more refills of this medication will be provided, until she comes in for follow-up with [name] Copa PA-C on 12/02/16.   Informed the pt that she should take her 1st dose of metolazone 2.5 mg every other day, 30 mins before taking her morning lasix dose starting tomorrow 12/13, then hold on Thursday 12/14, then take on Friday 12/15, hold on Saturday 12/16, then take last dose on Sunday 12/17, and then a BMET to be done on Monday 12/18 at our office, to reassess renal function, per [place].   Pt verbalized understanding and agrees with this plan.  Pt is aware that for further refills of this med after Sunday,  she will have to be compliant with showing up for her appts.

## 2016-11-08 NOTE — Telephone Encounter (Signed)
Alexandra Beck is calling in reference to her medication ( Metolazone ) . Please call

## 2016-11-08 NOTE — Telephone Encounter (Signed)
Nuala Alpha, LPN at [id number]-[id number] PM   Status: Signed    Informed the pt that I spoke with Ellen Henri PA-C, and she states that she will refill her metolazone 2.5 mg po every other day, to take 30 mins prior to morning lasix dose (pt to only get 3 tablets), only if the pt will be compliant and come in for a repeat BMET on next Monday 12/18, to reassess renal fx.   Informed the pt that per [place], if she is non-compliant with following through on keeping her lab appt for 12/18, then no more refills of this medication will be provided, until she comes in for follow-up with [name] Copa PA-C on 12/02/16.   Informed the pt that she should take her 1st dose of metolazone 2.5 mg every other day, 30 mins before taking her morning lasix dose starting tomorrow 12/13, then hold on Thursday 12/14, then take on Friday 12/15, hold on Saturday 12/16, then take last dose on Sunday 12/17, and then a BMET to be done on Monday 12/18 at our office, to reassess renal function, per [place].   Pt verbalized understanding and agrees with this plan.  Pt is aware that for further refills of this med after Sunday,  she will have to be compliant with showing up for her appts.      Nuala Alpha, LPN at [id number]-GRM PM   Status: Signed    Pt rescheduled her follow-up appt now for 12/02/16 at 1000 with Dayna Dunn PA-C.  This was advised to her on 12/7, when she cancelled her previous follow-up appt to discuss further plan after initiation on metolazone and lasix.      Nuala Alpha, LPN at [phone] PM   Status: Signed    According to note below per Ellen Henri from 11/10, the pt was to take her increased lasix and trial of metolazone x 3 days and follow-up with her on 11/27  to assess response, with a BMET at that visit.    Pt called and cancelled her appt on 11/27 and stated to the operator that she would call back to r/s at a later date.    Pt was then rescheduled to see [name] Copa on  tomorrow 12/8 to reassess this need again and have a bmet drawn, and she called and cancelled this for the second time, and told the operator that she would call back and r/s her appt to a later date.  Pt is now calling into the office to get further refills of her metolazone, for this was only prescribed x 3 days, and she has cancelled 2 appts to assess this and possibly receive more refills.  Tried contacting the pt and no answer received.  Left a detailed message on her VM to call the office back tomorrow and ask to speak with a triage Nurse, to reschedule her appts and further discuss her metolazone.   Will route this to triage for further follow-up.  Will also route this to [place] and Dr Meda Coffee, for their review as well.

## 2016-11-08 NOTE — Addendum Note (Signed)
Addended by: Nuala Alpha on: 11/08/2016 03:49 PM   Modules accepted: Orders

## 2016-11-14 ENCOUNTER — Other Ambulatory Visit: Payer: BC Managed Care – PPO

## 2016-11-14 DIAGNOSIS — I509 Heart failure, unspecified: Secondary | ICD-10-CM

## 2016-11-14 LAB — BASIC METABOLIC PANEL
BUN: 20 mg/dL (ref 7–25)
CALCIUM: 9.9 mg/dL (ref 8.6–10.2)
CO2: 30 mmol/L (ref 20–31)
Chloride: 92 mmol/L — ABNORMAL LOW (ref 98–110)
Creat: 1.06 mg/dL (ref 0.50–1.10)
GLUCOSE: 141 mg/dL — AB (ref 65–99)
POTASSIUM: 3.5 mmol/L (ref 3.5–5.3)
Sodium: 136 mmol/L (ref 135–146)

## 2016-11-15 ENCOUNTER — Telehealth: Payer: Self-pay | Admitting: *Deleted

## 2016-11-15 ENCOUNTER — Other Ambulatory Visit: Payer: Self-pay | Admitting: *Deleted

## 2016-11-15 ENCOUNTER — Other Ambulatory Visit: Payer: BC Managed Care – PPO

## 2016-11-15 DIAGNOSIS — I509 Heart failure, unspecified: Secondary | ICD-10-CM

## 2016-11-15 MED ORDER — METOLAZONE 2.5 MG PO TABS: ORAL_TABLET | ORAL | 0 refills | Status: DC

## 2016-11-15 NOTE — Telephone Encounter (Signed)
Pt has been made aware that this refill will be sent to her pharmacy, but in order for her to receive anymore, she will need to keep her f/u appt. Pt verbalized understanding.

## 2016-11-15 NOTE — Progress Notes (Signed)
6 tablets no refills of metolazone.  If taking every other day, this should get her to her next appt. Repeat BMP when she sees Dayna in F/u. At that point, I think it would be reasonable to order more and keep her on a schedule if renal function and K are still stable.

## 2016-11-15 NOTE — Telephone Encounter (Signed)
-----   Message from Consuelo Pandy, [place] sent at 11/15/2016 10:57 AM EST ----- 6 tablets no refills of metolazone.  If taking every other day, this should get her to her next appt. Repeat BMP when she sees Dayna in F/u. At that point, I think it would be reasonable to order more and keep her on a schedule if renal function and  K are still stable.

## 2016-11-15 NOTE — Telephone Encounter (Signed)
Per call from patient requesting additional tablets, I have consulted with Jeanann Lewandowsky and given the okay to send in a quantity of two in addition to the six that was sent in earlier today to get patient through until her appointment. Patient aware that she must keep appointment for further refills to be granted.

## 2016-11-24 ENCOUNTER — Other Ambulatory Visit: Payer: Self-pay | Admitting: *Deleted

## 2016-11-24 DIAGNOSIS — I509 Heart failure, unspecified: Secondary | ICD-10-CM

## 2016-11-24 MED ORDER — METOLAZONE 2.5 MG PO TABS: ORAL_TABLET | ORAL | 0 refills | Status: DC

## 2016-12-01 ENCOUNTER — Encounter: Payer: Self-pay | Admitting: Physician Assistant

## 2016-12-01 DIAGNOSIS — I5032 Chronic diastolic (congestive) heart failure: Secondary | ICD-10-CM | POA: Insufficient documentation

## 2016-12-01 NOTE — Progress Notes (Addendum)
Cardiology Office Note    Date:  12/02/2016  ID:  JAMIEANN WASSENAAR, DOB 1981-05-09, MRN [id number] PCP:  Mauricio Po, FNP  Cardiologist:  Dr. Meda Coffee   Chief Complaint: f/u CHF  History of Present Illness:  CRYSTALINE EDSTROM is a 36 y.o. female with history of extreme morbid obesity, OHS/OSA, physical inactivity, hypertension, hyperlipidemia, hypothyroidism, anemia, DM, hypothyroidism, GERD, recurrent cellulitis, venous insufficiency, chronic diastolic CHF, acute renal failure in 2014 who presents for follow-up. 2D echo 09/15/16: mild LVH, EF 55%, no RWMA, trivial AI, poor image quality. She has been followed over the fall for possible volume overload. At last visit with Lyda Jester PA-C on 10/07/16, the patient reported initial improvement on increased dose of Lasix but had stalled with worsening weight gain and edema. She was asked to increase Lasix to 60mg  BID and was given 3 doses of metolazone. She called in requesting a refill but this was initially deferred as she had cancelled 2 subsequent follow-up appointments. On 12/7 this was refilled for 3 doses QOD and the patient came in for repeat labs 11/14/16 showed K 3.5, Cr 1.06. On 11/15/16 she was given 6 additional tablets of metolazone to take every other day. Labs otherwise notable in 08/2016 for albumin 3.4, BNP 641, WBC 12.2, Hgb 12.4. UA 07/2016 with 100 protein.  She presents back to clinic today for follow-up. She is taking the metolazone every other day. She feels like this has helped find a good balance with her fluid status. Her weight is back to baseline at 443lb. She does report drinking as much fluid as she can in the form of sparkling water and regular water. She does not report any chest pain or dyspnea. She does feel occasional infrequent palpitations. She reports she was once told during her echo that she was having several extra heartbeats. We had a long discussion about long-term plan for weight maintenance. She says she goes  through cycles of tracking with Weight Watchers and My[employer]itness[employer] but then drops off. She plans to join a gym with a friend. When asked about whether the issue of bariatric referral has been addressed, she notes that several providers have talked to her about this ever since her hospitalizations for recurrent cellulitis.   Past Medical History:  Diagnosis Date  . Anemia   . Chronic diastolic CHF (congestive heart failure) ([place])   . Depression   . Diabetes mellitus ([place])   . GERD (gastroesophageal reflux disease)   . Hyperlipidemia   . Hypertension   . Hypothyroidism   . Morbid obesity with BMI of 70 and over, adult ([place])   . Obesity hypoventilation syndrome ([place])   . OSA (obstructive sleep apnea)   . Recurrent cellulitis of lower leg    LLE, venous insuff  . Sepsis ([place]) 11/2012   Secondary to cellulitis  . Venous insufficiency of leg     Past Surgical History:  Procedure Laterality Date  . WISDOM TOOTH EXTRACTION      Current Medications: Current Outpatient Prescriptions  Medication Sig Dispense Refill  . furosemide (LASIX) 40 MG tablet Take 1.5 tablets (60 mg total) by mouth 2 (two) times daily. 90 tablet 5  . JARDIANCE 10 MG TABS tablet TAKE 1 TABLET BY MOUTH DAILY 30 tablet 0  . levothyroxine (SYNTHROID, LEVOTHROID) 150 MCG tablet TAKE 1 TABLET BY MOUTH EVERY MORNING BEFORE BREAKFAST 30 tablet 0  . losartan (COZAAR) 25 MG tablet Take 1 tablet (25 mg total) by mouth daily. 90 tablet 3  .  metolazone (ZAROXOLYN) 2.5 MG tablet Take 1 tablet by mouth every other day 30 minute before taking lasix 4 tablet 0  . metoprolol succinate (TOPROL-XL) 50 MG 24 hr tablet TAKE 1 TABLET BY MOUTH EVERY DAY WITH OR FOLLOWING A MEAL 30 tablet 0  . potassium chloride SA (K-DUR,KLOR-CON) 20 MEQ tablet Take 1 tablet (20 mEq total) by mouth daily. 30 tablet 5  . PREVIDENT 5000 BOOSTER PLUS 1.1 % PSTE BRUSH ON TEETH BID FOR 2 MINS EACH TIME. EXPECTORATE AS MUCH AS POSSIBLE THEN DO NOT SWISH EAT OR  DRINK FOR 30 MINS AFTER TREATMENT  0  . rosuvastatin (CRESTOR) 10 MG tablet Take 1 tablet (10 mg total) by mouth daily. 90 tablet 1   No current facility-administered medications for this visit.      Allergies:   Aspirin; Doxycycline; Lisinopril; and Niaspan [niacin er]   Social History   Social History  . Marital status: Single    Spouse name: N/A  . Number of children: N/A  . Years of education: N/A   Occupational History  . 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 Smoking status: Former Smoker    Years: 0.00  . Smokeless tobacco: Never Used  . Alcohol use No  . Drug use: No  . Sexual activity: Not Asked   Other Topics Concern  . 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   ROS:   Please see the history of present illness. All other systems are reviewed and otherwise negative.    PHYSICAL EXAM:   VS:  BP 122/64 (BP Location: Left Arm, Patient Position: Sitting, Cuff Size: Large)   Pulse 62   Ht 5\' 2"  (1.575 m)   Wt (!) 443 lb 6.4 oz (201.1 kg)   SpO2 94%   BMI 81.10 kg/m   BMI: Body mass index is 81.1 kg/m. GEN: Well nourished, well developed obese WF, in no acute distress  HEENT: normocephalic, atraumatic Neck: no JVD, carotid bruits, or masses Cardiac: RRR; no murmurs, rubs, or gallops, chronic lower extremity edema superimposed on baseline large leg habitus Respiratory:  clear to auscultation bilaterally, normal work of breathing GI: soft, nontender, nondistended, + BS MS: no deformity or atrophy  Skin: warm and dry, no rash Neuro:  Alert and Oriented x 3, Strength and sensation are intact, follows commands Psych: euthymic mood, full affect  Wt Readings from Last 3 Encounters:  12/02/16 (!) 443 lb 6.4 oz (201.1 kg)  10/07/16 (!) 455 lb 1.9 oz (206.4 kg)  09/16/16 (!) 443  lb (200.9 kg)      Studies/Labs Reviewed:   EKG:  EKG was ordered today and personally reviewed by me and demonstrates NSR with frequent PVCs, diffuse TWI I, II, possibly avF, V2-V6, TW flattening avL, III.  Recent Labs: 03/02/2016: TSH 3.83 08/30/2016: ALT 17; Hemoglobin 12.4; Platelets 262.0; Pro B Natriuretic peptide (BNP) 641.0 11/14/2016: BUN 20; Creat 1.06; Potassium 3.5; Sodium 136   Lipid Panel    Component Value Date/Time   CHOL 228 (H) 03/02/2016 1101   TRIG 295.0 (H) 03/02/2016 1101   HDL 41.10 03/02/2016 1101   CHOLHDL 6 03/02/2016 1101   VLDL 59.0 (H) 03/02/2016 1101   LDLCALC 92 07/15/2013 1030   LDLDIRECT 141.0 03/02/2016 1101    Additional studies/ records that were  reviewed today include: Summarized above.    ASSESSMENT & PLAN:   1. Chronic diastolic CHF - seems to have gotten back to patient's perception of euvolemia on current regimen. Her volume status is very difficult to evaluate on exam given her body habitus. Will check stat labs today to help guide appropriateness of refill of metolazone. Reinforced importance of 2L fluid restriction and 2g sodium diet. See below regarding obesity. 2. Abnormal EKG/PVCs - EKG shows incidental finding of nonspecific TWI as well as frequent PVCs. This was obtained due to irregular manual pulse check. She is minimally symptomatic from her PVCs. She states she was told she had these when she had her echo in the past. Check lytes today as above. Continue Toprol. The T wave inversion is somewhat unexpected. She had similar changes on EKG in 2014 that improved on follow-up tracings. I reviewed with Dr. Meda Coffee. The patient would be a difficult candidate to consider noninvasive ischemic screening testing on, and at present time she is not describing any convincing symptoms of angina. Per Dr. Meda Coffee, the patient would benefit from follow-up in 2 weeks to recheck EKG - will await stat labs to help guide timing of recheck in case sooner  follow-up is needed. 3. Morbid obesity - gently explained the significant impact this has on her health. I told her long term the above issues are not likely to improve and can worsen if her obesity is not addressed. I agree with prior providers suggesting referral to bariatric clinic, even if just for attendance to their clinical seminars regarding weight loss. The patient previously had referral placed by PCP. I gave her information today about contacting their clinic and attending their initial education classes. Reinforced importance of dietary and lifestyle modification. 4. Essential HTN - controlled on present regimen. 5. HLD - followed by PCP.  Disposition: F/u with Dr. Meda Coffee in 3 months. See above about bringing back sooner for recheck EKG, timing TBD based on labs.   Medication Adjustments/Labs and Tests Ordered: Current medicines are reviewed at length with the patient today.  Concerns regarding medicines are outlined above. Medication changes, Labs and Tests ordered today are summarized above and listed in the Patient Instructions accessible in Encounters.   Raechel Ache PA-C  12/02/2016 10:37 AM    [address], [place], [place]  [address] Phone: [phone]; Fax: [phone]

## 2016-12-02 ENCOUNTER — Telehealth: Payer: Self-pay

## 2016-12-02 ENCOUNTER — Ambulatory Visit (INDEPENDENT_AMBULATORY_CARE_PROVIDER_SITE_OTHER): Payer: BC Managed Care – PPO | Admitting: Physician Assistant

## 2016-12-02 ENCOUNTER — Encounter: Payer: Self-pay | Admitting: Physician Assistant

## 2016-12-02 VITALS — BP 122/64 | HR 62 | Ht 62.0 in | Wt >= 6400 oz

## 2016-12-02 DIAGNOSIS — I493 Ventricular premature depolarization: Secondary | ICD-10-CM

## 2016-12-02 DIAGNOSIS — I10 Essential (primary) hypertension: Secondary | ICD-10-CM | POA: Diagnosis not present

## 2016-12-02 DIAGNOSIS — E7849 Other hyperlipidemia: Secondary | ICD-10-CM

## 2016-12-02 DIAGNOSIS — I509 Heart failure, unspecified: Secondary | ICD-10-CM

## 2016-12-02 DIAGNOSIS — I5032 Chronic diastolic (congestive) heart failure: Secondary | ICD-10-CM | POA: Diagnosis not present

## 2016-12-02 DIAGNOSIS — E784 Other hyperlipidemia: Secondary | ICD-10-CM | POA: Diagnosis not present

## 2016-12-02 DIAGNOSIS — Z6841 Body Mass Index (BMI) 40.0 and over, adult: Secondary | ICD-10-CM

## 2016-12-02 LAB — BASIC METABOLIC PANEL
BUN/Creatinine Ratio: 30 — ABNORMAL HIGH (ref 9–23)
BUN: 34 mg/dL — ABNORMAL HIGH (ref 6–20)
CALCIUM: 9.5 mg/dL (ref 8.7–10.2)
CO2: 36 mmol/L — AB (ref 18–29)
CREATININE: 1.13 mg/dL — AB (ref 0.57–1.00)
Chloride: 90 mmol/L — ABNORMAL LOW (ref 96–106)
GFR calc Af Amer: 72 mL/min/{1.73_m2} (ref >59)
GFR, EST NON AFRICAN AMERICAN: 63 mL/min/{1.73_m2} (ref >59)
GLUCOSE: 137 mg/dL — AB (ref 65–99)
Potassium: 3.2 mmol/L — ABNORMAL LOW (ref 3.5–5.2)
Sodium: 137 mmol/L (ref 134–144)

## 2016-12-02 LAB — MAGNESIUM: MAGNESIUM: 1.8 mg/dL (ref 1.6–2.3)

## 2016-12-02 MED ORDER — METOLAZONE 2.5 MG PO TABS: ORAL_TABLET | ORAL | 0 refills | Status: DC

## 2016-12-02 NOTE — Telephone Encounter (Signed)
Discussed with patient, pt verbalized understanding, agreed with plan.

## 2016-12-02 NOTE — Addendum Note (Signed)
Addended by: Katrine Coho on: 12/02/2016 02:40 PM   Modules accepted: Orders

## 2016-12-02 NOTE — Telephone Encounter (Signed)
Alexandra Beck I agree with your plan.

## 2016-12-02 NOTE — Telephone Encounter (Signed)
Based on these labs she may be on the drier side. I would recommend decreasing metolazone to a maintenance dose of twice a week (she was taking this every other day). Continue KCl 35meq but take 63meq on days when she takes metolazone. Please let her know I reviewed her EKG with Dr. Meda Coffee - this showed several PVCs as I discussed with her as well as some nonspecific T wave changes. These may be related to her electrolyte loss, but we need to recheck this once her meds have been adjusted. Please have her f/u in 1-2 weeks with either me or Brittainy Simmons - she's seen [place] both. Keep plan for follow-up in 3 months with Dr. Meda Coffee as planned. If she develops any chest pain or SOB or any new symptoms in the meantime she should let us know or seek medical attention. Bradford Cazier PA-C

## 2016-12-02 NOTE — Telephone Encounter (Signed)
Received incoming call with stat labs from [facility], spoke with Arbie Cookey. Results given for BMP and Mag. BUN 34; Creatinine 1.13; Calcium 9.5; CO2 36; Chloride 90; Glucose 137; Magnesium 1.8; Potassium 3.2; Sodium 137. Will forward to Dr. Meda Coffee and [name] Copa, PA.

## 2016-12-02 NOTE — Telephone Encounter (Signed)
LMTCB

## 2016-12-02 NOTE — Patient Instructions (Signed)
Medication Instructions:  Your physician recommends that you continue on your current medications as directed. Please refer to the Current Medication list given to you today.   Labwork: BMET/Magnesium today--STAT  Testing/Procedures: None   Follow-Up: Your physician recommends that you schedule a follow-up appointment in: 3 months with Dr Meda Coffee.     Any Other Special Instructions Will Be Listed Below (If Applicable).  You have been given information for Bariatric Surgery -- you can call, [phone],  or register online to sign up for a Seminar.    If you need a refill on your cardiac medications before your next appointment, please call your pharmacy.

## 2016-12-04 ENCOUNTER — Encounter: Payer: Self-pay | Admitting: Physician Assistant

## 2016-12-11 ENCOUNTER — Other Ambulatory Visit: Payer: Self-pay | Admitting: Family

## 2016-12-11 DIAGNOSIS — E039 Hypothyroidism, unspecified: Secondary | ICD-10-CM

## 2016-12-16 ENCOUNTER — Encounter: Payer: Self-pay | Admitting: Physician Assistant

## 2016-12-16 ENCOUNTER — Ambulatory Visit (INDEPENDENT_AMBULATORY_CARE_PROVIDER_SITE_OTHER): Payer: BC Managed Care – PPO | Admitting: Physician Assistant

## 2016-12-16 ENCOUNTER — Ambulatory Visit (INDEPENDENT_AMBULATORY_CARE_PROVIDER_SITE_OTHER): Payer: BC Managed Care – PPO

## 2016-12-16 VITALS — BP 110/70 | HR 88 | Ht 62.0 in | Wt >= 6400 oz

## 2016-12-16 DIAGNOSIS — R9431 Abnormal electrocardiogram [ECG] [EKG]: Secondary | ICD-10-CM | POA: Insufficient documentation

## 2016-12-16 DIAGNOSIS — I493 Ventricular premature depolarization: Secondary | ICD-10-CM

## 2016-12-16 DIAGNOSIS — E876 Hypokalemia: Secondary | ICD-10-CM | POA: Diagnosis not present

## 2016-12-16 DIAGNOSIS — I5032 Chronic diastolic (congestive) heart failure: Secondary | ICD-10-CM | POA: Diagnosis not present

## 2016-12-16 LAB — EXERCISE TOLERANCE TEST
CHL CUP MPHR: 184 {beats}/min
CHL CUP RESTING HR STRESS: 15 {beats}/min
CHL RATE OF PERCEIVED EXERTION: 15
CSEPEDS: 14 s
Estimated workload: 2.5 METS
Exercise duration (min): 1 min
Peak HR: 123 {beats}/min
Percent HR: 66 %

## 2016-12-16 NOTE — Progress Notes (Addendum)
Cardiology Office Note    Date:  12/16/2016  ID:  [name], DOB 1981-11-07, MRN [id number] PCP:  Mauricio Po, FNP  Cardiologist:  Dr. Meda Coffee   Chief Complaint: f/u CHF, abnormal EKG  History of Present Illness:  Alexandra Beck is a 36 y.o. female with history of extreme morbid obesity, OHS/OSA, physical inactivity, hypertension, hyperlipidemia, hypothyroidism, anemia, DM, hypothyroidism, GERD, recurrent cellulitis, venous insufficiency, chronic diastolic CHF, PVCs on EKG, acute renal failure in 2014 who presents for follow-up of CHF and abnormal EKG. 2D echo 09/15/16: mild LVH, EF 55%, no RWMA, trivial AI, poor image quality. She has been followed over the fall for volume overload, ultimately achieving improvement in symptoms with combination of Lasix and metolazone. She was seen in clinic in followup 2 weeks ago for reassessment at which time labs showed BUN 34, Cr 1.13, potassium 3.2, magnesium 1.8. She had been taking metolazone every other day - based on labs, she was instructed to decrease metolazone to twice weekly and increase her potassium on metolazone days. Labs otherwise notable in 08/2016 for albumin 3.4, BNP 641, WBC 12.2, Hgb 12.4. UA 07/2016 with 100 protein. She was feeling well but EKG demonstrated frequent PVCs and more notable TW changes compared to prior (diffuse TWI I, II, possibly avF, V2-V6, TW flattening avL, III). These changes were seen when she was admitted in 2014, but new compared to 2015 & 2017. Per review with Dr. Meda Coffee, it was unclear if these were related to electrolyte deficiency, LVH, or ischemia, thus she was brought back for recheck today.  Ms. Alexandra Beck presents back to clinic today feeling well. She states she misinterpreted the results call and has not decreased her metolazone to twice a week yet. She feels her edema is stable. She did increase her potassium though. She remains inactive but plans on joining [employer] next week and walking regularly. She  denies any recent chest pain or shortness of breath. No palpitations.    Past Medical History:  Diagnosis Date  . Acute renal failure (ARF) (Alexandra Beck) 11/2012    multifactorial-likely secondary to ATN in the setting of sepsis and hypotension, and also from rhabdomyolysis  . Anemia   . Chronic diastolic CHF (congestive heart failure) (Alexandra Beck)   . Depression   . Diabetes mellitus ([place])   . GERD (gastroesophageal reflux disease)   . Hyperlipidemia   . Hypertension   . Hypothyroidism   . Morbid obesity with BMI of 70 and over, adult ([place])   . Obesity hypoventilation syndrome (Alexandra Beck)   . OSA (obstructive sleep apnea)   . PVC's (premature ventricular contractions)   . Recurrent cellulitis of lower leg    LLE, venous insuff  . Sepsis (Alexandra Beck) 11/2012   Secondary to cellulitis  . Venous insufficiency of leg     Past Surgical History:  Procedure Laterality Date  . WISDOM TOOTH EXTRACTION      Current Medications: Current Outpatient Prescriptions  Medication Sig Dispense Refill  . furosemide (LASIX) 40 MG tablet Take 1.5 tablets (60 mg total) by mouth 2 (two) times daily. 90 tablet 5  . JARDIANCE 10 MG TABS tablet TAKE 1 TABLET BY MOUTH DAILY 30 tablet 0  . levothyroxine (SYNTHROID, LEVOTHROID) 150 MCG tablet TAKE 1 TABLET BY MOUTH EVERY MORNING BEFORE BREAKFAST 30 tablet 0  . losartan (COZAAR) 25 MG tablet Take 25 mg by mouth daily.    . metolazone (ZAROXOLYN) 2.5 MG tablet Take 1 tablet by mouth two times a week on Thursdays  and Sundays before taking lasix 10 tablet 0  . metoprolol succinate (TOPROL-XL) 50 MG 24 hr tablet TAKE 1 TABLET BY MOUTH EVERY DAY WITH OR FOLLOWING A MEAL 30 tablet 0  . potassium chloride SA (K-DUR,KLOR-CON) 20 MEQ tablet Take 1 tablet (20 mEq total) by mouth daily. Except on the days you take metolazone take 2 tablets (40 mEq) by mouth.    Marland Kitchen PREVIDENT 5000 BOOSTER PLUS 1.1 % PSTE BRUSH ON TEETH BID FOR 2 MINS EACH TIME. EXPECTORATE AS MUCH AS POSSIBLE THEN DO NOT SWISH EAT  OR DRINK FOR 30 MINS AFTER TREATMENT  0  . rosuvastatin (CRESTOR) 10 MG tablet Take 1 tablet (10 mg total) by mouth daily. 90 tablet 1   No current facility-administered medications for this visit.      Allergies:   Aspirin; Doxycycline; Lisinopril; and Niaspan [niacin er]   Social History   Social History  . Marital status: Single    Spouse name: N/A  . Number of children: N/A  . Years of education: N/A   Occupational History  . 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 Smoking status: Former Smoker    Years: 0.00  . Smokeless tobacco: Never Used  . Alcohol use No  . Drug use: No  . Sexual activity: Not Asked   Other Topics Concern  . 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   ROS:   Please see the history of present illness. All other systems are reviewed and otherwise negative.    PHYSICAL EXAM:   VS:  BP 110/70 (BP Location: Left Arm, Patient Position: Sitting, Cuff Size: Large)   Pulse 88   Ht 5\' 2"  (1.575 m)   Wt (!) 442 lb (200.5 kg)   LMP 11/25/2016   BMI 80.84 kg/m   BMI: Body mass index is 80.84 kg/m. GEN: Well nourished, well developed obese WF, in no acute distress  HEENT: normocephalic, atraumatic Neck: no JVD or masses. Carotid bruits assessed last visit, no interim change. Cardiac: RRR; no murmurs, rubs, or gallops, chronic lower extremity edema superimposed on large baseline leg habitus Respiratory:  clear to auscultation bilaterally, normal work of breathing GI: soft, nontender, nondistended, + BS MS: no deformity or atrophy  Skin: warm and dry, compression hose in place, no diffuse rashes Neuro:  Alert and Oriented x 3, Strength and sensation are intact, follows commands Psych: euthymic mood, full affect  Wt Readings from Last 3 Encounters:  12/16/16 (!) 442 lb  (200.5 kg)  12/02/16 (!) 443 lb 6.4 oz (201.1 kg)  10/07/16 (!) 455 lb 1.9 oz (206.4 kg)      Studies/Labs Reviewed:   EKG:  EKG was ordered today and personally reviewed by me and demonstrates NSR TWI I, II, III, avF, V4-V6 simlar to previous tracing earlier this month  Recent Labs: 03/02/2016: TSH 3.83 08/30/2016: ALT 17; Hemoglobin 12.4; Platelets 262.0; Pro B Natriuretic peptide (BNP) 641.0 12/02/2016: BUN 34; Creatinine, Ser 1.13; Magnesium 1.8; Potassium 3.2; Sodium 137   Lipid Panel    Component Value Date/Time   CHOL 228 (H) 03/02/2016 1101   TRIG 295.0 (H) 03/02/2016 1101   HDL 41.10 03/02/2016 1101   CHOLHDL 6 03/02/2016 1101   VLDL 59.0 (H) 03/02/2016 1101   LDLCALC 92 07/15/2013 1030   LDLDIRECT  141.0 03/02/2016 1101    Additional studies/ records that were reviewed today include: Summarized above.    ASSESSMENT & PLAN:   1. Abnormal EKG - variable since 2014. There are similarities between the 2014 and January 2018 tracings, but different from 2015 & 2017. She is not describing any symptoms of angina but does not exercise regularly. I reviewed with Dr. Radford Pax. Remains unclear if these are related to LVH, electrolyte abnormality or ischemia. Unfortunately noninvasive imaging is not ideal given her body habitus. Would not jump straight to cath as she is not describing any unstable symptoms. Per our discussion, will start with ETT for further evaluation  - even though baseline EKG is abnormal, Dr. Radford Pax believes it would be helpful to assess for ST depression as well as functional capacity and exertional angina. Note she has an aspirin allergy so she is not presently taking a prophylactic dose. ADDENDUM 4:15 PM: patient was able to complete ETT in our office today due to open slot. She had not taken her metoprolol yet today. Per tech, the test was started with standard Bruce protocol but the patient became anxious that this level was too fast so requested it be slowed down.  She then began to exercise. After approximately 2 minutes of exercise the patient began crying and felt like this was faster than she would typically be able to keep up with. She also went into ventricular bigeminy. Due to this, along with the patient's anxiety, the tech stopped the treadmill. Ms. Boas did not have any angina at this level of activity. She expressed that she felt like she could've gone further - the tech did not observe any dyspnea, just anxiety. The patient felt well thereafter. Tracings reviewed with Dr. Radford Pax. It is difficult to interpret given baseline abnormality and frequent ectopy, but Dr. Radford Pax feels the TWI seems to normalize somewhat with exertion rather than significantly worsen. In light of this, continued medical therapy is recommended including beta blocker therapy. If she develops any progressive anginal symptoms in the future, she would need definitive cardiac cath as she will not physically fit within the nuclear or CT scanner. Regarding frequent ectopy, would recommend to continue compliance with beta blocker and replete lytes if needed. She plans to begin increasing her activity regularly. We discussed importance of gradual increase of exertion level, starting off slow and moving up as tolerated.  We reviewed importance of notifying our office if she does begin to develop any new symptoms in the interim. 2. Frequent PVCs - as above. Improved at rest today, compared to last visit. Plan to recheck BMET today along with updated TSH. 3. Chronic diastolic CHF - she did not yet decrease metolazone, but did increase potassium. I believe her volume status is stable. Recheck BMET today to reassess Cr and potassium. Will make further recommendations based on this result. 4. Morbid obesity - long talk last visit regarding importance of long-term weight loss and bariatric referral. She seems committed to starting an exercise regimen in 2018. 5. HTN - controlled, follow on present  regimen.  Disposition: F/u with Dr. Meda Coffee in 3 months (sooner if needed).  Medication Adjustments/Labs and Tests Ordered: Current medicines are reviewed at length with the patient today.  Concerns regarding medicines are outlined above. Medication changes, Labs and Tests ordered today are summarized above and listed in the Patient Instructions accessible in Encounters.   Signed, [name] Copa PA-C  12/16/2016 1:53 PM    [facility]  [address], [place], [place]  [address] Phone: [phone]; Fax: [phone]

## 2016-12-16 NOTE — Patient Instructions (Addendum)
Medication Instructions:   Your physician recommends that you continue on your current medications as directed. Please refer to the Current Medication list given to you today.   If you need a refill on your cardiac medications before your next appointment, please call your pharmacy.  Labwork BMET AND TSH TODAY   Testing/Procedures: Your physician has requested that you have an exercise tolerance test. For further information please visit [email or web address]. Please also follow instruction sheet, as given.   Follow-Up:  IN 3 MONTHS WITH DR Meda Coffee   Any Other Special Instructions Will Be Listed Below (If Applicable).

## 2016-12-17 LAB — BASIC METABOLIC PANEL
BUN / CREAT RATIO: 22 (ref 9–23)
BUN: 27 mg/dL — AB (ref 6–20)
CO2: 30 mmol/L — ABNORMAL HIGH (ref 18–29)
Calcium: 9.6 mg/dL (ref 8.7–10.2)
Chloride: 85 mmol/L — ABNORMAL LOW (ref 96–106)
Creatinine, Ser: 1.23 mg/dL — ABNORMAL HIGH (ref 0.57–1.00)
GFR calc non Af Amer: 57 mL/min/{1.73_m2} — ABNORMAL LOW (ref >59)
GFR, EST AFRICAN AMERICAN: 65 mL/min/{1.73_m2} (ref >59)
Glucose: 221 mg/dL — ABNORMAL HIGH (ref 65–99)
Potassium: 3 mmol/L — ABNORMAL LOW (ref 3.5–5.2)
Sodium: 140 mmol/L (ref 134–144)

## 2016-12-17 LAB — TSH: TSH: 1.31 u[IU]/mL (ref 0.450–4.500)

## 2016-12-20 ENCOUNTER — Telehealth: Payer: Self-pay | Admitting: *Deleted

## 2016-12-20 DIAGNOSIS — R7989 Other specified abnormal findings of blood chemistry: Secondary | ICD-10-CM

## 2016-12-20 DIAGNOSIS — I509 Heart failure, unspecified: Secondary | ICD-10-CM

## 2016-12-20 MED ORDER — METOLAZONE 2.5 MG PO TABS: ORAL_TABLET | ORAL | 0 refills | Status: DC

## 2016-12-20 NOTE — Telephone Encounter (Signed)
-----   Message from Charlie Pitter, [place] sent at 12/19/2016  8:36 AM EST ----- Please let patient know labs continue to show (as they did last week) that she appears on the dry side. She did not reduce her metolazone as we discussed (see phone note from 1/5.) Based on these labs I would actually recommend she decrease metolazone further to once a week, with potassium 25meq daily. Recheck BMET 1 week. Needs to strictly fluid restrict to 64 oz per day (no more) and 2000mg  of sodium per day. If she begins to notice fluid retention we may consider increasing Lasix further, but I am concerned the metolazone is causing her electrolytes to drop. Also needs rx for metolazone.  Dayna Dunn PA-C

## 2016-12-20 NOTE — Telephone Encounter (Signed)
Pt advised that she misunderstood decreasing the Metolazone, but she did increase the Potassium, Pt aware that she will decrease Metolazone to 1 X a week.  She will take it on Friday that way she can come to the lab the following Friday, 12/30/16 for repeat bmet. Pt stated that she didn't need a refill for Metolazone right now, that she will let us know when.

## 2017-01-04 ENCOUNTER — Other Ambulatory Visit: Payer: Self-pay | Admitting: Physician Assistant

## 2017-01-04 ENCOUNTER — Other Ambulatory Visit: Payer: Self-pay | Admitting: Family

## 2017-01-04 DIAGNOSIS — E039 Hypothyroidism, unspecified: Secondary | ICD-10-CM

## 2017-01-04 DIAGNOSIS — I509 Heart failure, unspecified: Secondary | ICD-10-CM

## 2017-01-05 ENCOUNTER — Telehealth: Payer: Self-pay | Admitting: *Deleted

## 2017-01-05 NOTE — Telephone Encounter (Signed)
Patient left a msg on the refill vm stating that Dayna Dunn increased the potassium to 20 meq bid. This is not what is reflected on current med list. Patient can be reached at [phone]. Please advise. Thanks, MI

## 2017-01-06 ENCOUNTER — Other Ambulatory Visit: Payer: Self-pay | Admitting: *Deleted

## 2017-01-06 DIAGNOSIS — I509 Heart failure, unspecified: Secondary | ICD-10-CM

## 2017-01-06 MED ORDER — POTASSIUM CHLORIDE CRYS ER 20 MEQ PO TBCR: 40.0000 meq | EXTENDED_RELEASE_TABLET | Freq: Every day | ORAL | 11 refills | Status: DC

## 2017-01-11 ENCOUNTER — Inpatient Hospital Stay (HOSPITAL_COMMUNITY)
Admission: EM | Admit: 2017-01-11 | Discharge: 2017-01-14 | DRG: 872 | Disposition: A | Discharge status: Home / Self Care | Payer: BC Managed Care – PPO | Attending: Internal Medicine | Admitting: Internal Medicine

## 2017-01-11 ENCOUNTER — Encounter (HOSPITAL_COMMUNITY): Payer: Self-pay

## 2017-01-11 DIAGNOSIS — Z79899 Other long term (current) drug therapy: Secondary | ICD-10-CM

## 2017-01-11 DIAGNOSIS — E662 Morbid (severe) obesity with alveolar hypoventilation: Secondary | ICD-10-CM | POA: Diagnosis present

## 2017-01-11 DIAGNOSIS — E039 Hypothyroidism, unspecified: Secondary | ICD-10-CM | POA: Diagnosis present

## 2017-01-11 DIAGNOSIS — Z7984 Long term (current) use of oral hypoglycemic drugs: Secondary | ICD-10-CM

## 2017-01-11 DIAGNOSIS — E1169 Type 2 diabetes mellitus with other specified complication: Secondary | ICD-10-CM | POA: Diagnosis not present

## 2017-01-11 DIAGNOSIS — A419 Sepsis, unspecified organism: Principal | ICD-10-CM | POA: Diagnosis present

## 2017-01-11 DIAGNOSIS — E1159 Type 2 diabetes mellitus with other circulatory complications: Secondary | ICD-10-CM | POA: Diagnosis present

## 2017-01-11 DIAGNOSIS — L03115 Cellulitis of right lower limb: Secondary | ICD-10-CM | POA: Diagnosis not present

## 2017-01-11 DIAGNOSIS — Z87891 Personal history of nicotine dependence: Secondary | ICD-10-CM

## 2017-01-11 DIAGNOSIS — K219 Gastro-esophageal reflux disease without esophagitis: Secondary | ICD-10-CM | POA: Diagnosis present

## 2017-01-11 DIAGNOSIS — I5032 Chronic diastolic (congestive) heart failure: Secondary | ICD-10-CM | POA: Diagnosis present

## 2017-01-11 DIAGNOSIS — E1122 Type 2 diabetes mellitus with diabetic chronic kidney disease: Secondary | ICD-10-CM | POA: Diagnosis present

## 2017-01-11 DIAGNOSIS — E669 Obesity, unspecified: Secondary | ICD-10-CM | POA: Diagnosis not present

## 2017-01-11 DIAGNOSIS — E119 Type 2 diabetes mellitus without complications: Secondary | ICD-10-CM | POA: Diagnosis present

## 2017-01-11 DIAGNOSIS — G4733 Obstructive sleep apnea (adult) (pediatric): Secondary | ICD-10-CM | POA: Diagnosis present

## 2017-01-11 DIAGNOSIS — I152 Hypertension secondary to endocrine disorders: Secondary | ICD-10-CM | POA: Diagnosis present

## 2017-01-11 DIAGNOSIS — Z886 Allergy status to analgesic agent status: Secondary | ICD-10-CM

## 2017-01-11 DIAGNOSIS — E785 Hyperlipidemia, unspecified: Secondary | ICD-10-CM | POA: Diagnosis present

## 2017-01-11 DIAGNOSIS — Z6841 Body Mass Index (BMI) 40.0 and over, adult: Secondary | ICD-10-CM

## 2017-01-11 DIAGNOSIS — I110 Hypertensive heart disease with heart failure: Secondary | ICD-10-CM | POA: Diagnosis present

## 2017-01-11 DIAGNOSIS — Z888 Allergy status to other drugs, medicaments and biological substances status: Secondary | ICD-10-CM

## 2017-01-11 DIAGNOSIS — Z8249 Family history of ischemic heart disease and other diseases of the circulatory system: Secondary | ICD-10-CM

## 2017-01-11 DIAGNOSIS — I10 Essential (primary) hypertension: Secondary | ICD-10-CM | POA: Diagnosis present

## 2017-01-11 DIAGNOSIS — I872 Venous insufficiency (chronic) (peripheral): Secondary | ICD-10-CM | POA: Diagnosis present

## 2017-01-11 LAB — CBC WITH DIFFERENTIAL/PLATELET
BASOS ABS: 0 10*3/uL (ref 0.0–0.1)
BASOS PCT: 0 %
Eosinophils Absolute: 0 10*3/uL (ref 0.0–0.7)
Eosinophils Relative: 0 %
HEMATOCRIT: 43.6 % (ref 36.0–46.0)
HEMOGLOBIN: 13.5 g/dL (ref 12.0–15.0)
LYMPHS PCT: 5 %
Lymphs Abs: 0.7 10*3/uL (ref 0.7–4.0)
MCH: 28 pg (ref 26.0–34.0)
MCHC: 31 g/dL (ref 30.0–36.0)
MCV: 90.3 fL (ref 78.0–100.0)
Monocytes Absolute: 0.7 10*3/uL (ref 0.1–1.0)
Monocytes Relative: 5 %
NEUTROS ABS: 12.5 10*3/uL — AB (ref 1.7–7.7)
NEUTROS PCT: 90 %
Platelets: 189 10*3/uL (ref 150–400)
RBC: 4.83 MIL/uL (ref 3.87–5.11)
RDW: 17.2 % — AB (ref 11.5–15.5)
WBC: 13.9 10*3/uL — AB (ref 4.0–10.5)

## 2017-01-11 LAB — COMPREHENSIVE METABOLIC PANEL
ALT: 14 U/L (ref 14–54)
AST: 18 U/L (ref 15–41)
Albumin: 3.2 g/dL — ABNORMAL LOW (ref 3.5–5.0)
Alkaline Phosphatase: 59 U/L (ref 38–126)
Anion gap: 12 (ref 5–15)
BUN: 16 mg/dL (ref 6–20)
CHLORIDE: 98 mmol/L — AB (ref 101–111)
CO2: 29 mmol/L (ref 22–32)
Calcium: 9.1 mg/dL (ref 8.9–10.3)
Creatinine, Ser: 1.16 mg/dL — ABNORMAL HIGH (ref 0.44–1.00)
GFR, EST NON AFRICAN AMERICAN: 60 mL/min — AB (ref >60)
Glucose, Bld: 154 mg/dL — ABNORMAL HIGH (ref 65–99)
POTASSIUM: 4.2 mmol/L (ref 3.5–5.1)
SODIUM: 139 mmol/L (ref 135–145)
Total Bilirubin: 1.6 mg/dL — ABNORMAL HIGH (ref 0.3–1.2)
Total Protein: 7 g/dL (ref 6.5–8.1)

## 2017-01-11 LAB — PROTIME-INR
INR: 1.13
Prothrombin Time: 14.5 seconds (ref 11.4–15.2)

## 2017-01-11 LAB — I-STAT CG4 LACTIC ACID, ED
LACTIC ACID, VENOUS: 2.87 mmol/L — AB (ref 0.5–1.9)
LACTIC ACID, VENOUS: 1.28 mmol/L (ref 0.5–1.9)

## 2017-01-11 LAB — GLUCOSE, CAPILLARY: Glucose-Capillary: 140 mg/dL — ABNORMAL HIGH (ref 65–99)

## 2017-01-11 LAB — I-STAT BETA HCG BLOOD, ED (MC, WL, AP ONLY)

## 2017-01-11 MED ORDER — CANAGLIFLOZIN 100 MG PO TABS
100.0000 mg | ORAL_TABLET | Freq: Every day | ORAL | Status: DC
  Filled 2017-01-11: qty 1

## 2017-01-11 MED ORDER — SODIUM CHLORIDE 0.9 % IV BOLUS (SEPSIS): 1000.0000 mL | Freq: Once | INTRAVENOUS | Status: DC

## 2017-01-11 MED ORDER — ALTEPLASE (STROKE) FULL DOSE INFUSION: 0.9000 mg/kg | Freq: Once | INTRAVENOUS | Status: DC

## 2017-01-11 MED ORDER — SODIUM CHLORIDE 0.9 % IV BOLUS (SEPSIS)
1000.0000 mL | Freq: Once | INTRAVENOUS | Status: AC
  Administered 2017-01-11: 1000 mL via INTRAVENOUS

## 2017-01-11 MED ORDER — ENOXAPARIN SODIUM 100 MG/ML ~~LOC~~ SOLN
100.0000 mg | SUBCUTANEOUS | Status: DC
  Filled 2017-01-11 (×3): qty 1

## 2017-01-11 MED ORDER — PIPERACILLIN-TAZOBACTAM 3.375 G IVPB 30 MIN
3.3750 g | Freq: Once | INTRAVENOUS | Status: AC
  Administered 2017-01-11: 3.375 g via INTRAVENOUS
  Filled 2017-01-11: qty 50

## 2017-01-11 MED ORDER — ONDANSETRON HCL 4 MG/2ML IJ SOLN: 4.0000 mg | Freq: Four times a day (QID) | INTRAMUSCULAR | Status: DC | PRN

## 2017-01-11 MED ORDER — ACETAMINOPHEN 325 MG PO TABS
ORAL_TABLET | ORAL | Status: AC
  Filled 2017-01-11: qty 2

## 2017-01-11 MED ORDER — ACETAMINOPHEN 325 MG PO TABS
650.0000 mg | ORAL_TABLET | Freq: Four times a day (QID) | ORAL | Status: DC | PRN
  Administered 2017-01-12 – 2017-01-14 (×6): 650 mg via ORAL
  Filled 2017-01-11 (×6): qty 2

## 2017-01-11 MED ORDER — ACETAMINOPHEN 650 MG RE SUPP: 650.0000 mg | Freq: Four times a day (QID) | RECTAL | Status: DC | PRN

## 2017-01-11 MED ORDER — LEVOTHYROXINE SODIUM 150 MCG PO TABS
150.0000 ug | ORAL_TABLET | Freq: Every day | ORAL | Status: DC
  Administered 2017-01-12 – 2017-01-14 (×3): 150 ug via ORAL
  Filled 2017-01-11: qty 2
  Filled 2017-01-11: qty 1
  Filled 2017-01-11: qty 2
  Filled 2017-01-11: qty 1
  Filled 2017-01-11: qty 2
  Filled 2017-01-11: qty 1

## 2017-01-11 MED ORDER — ONDANSETRON HCL 4 MG PO TABS: 4.0000 mg | ORAL_TABLET | Freq: Four times a day (QID) | ORAL | Status: DC | PRN

## 2017-01-11 MED ORDER — SODIUM CHLORIDE 0.9 % IV SOLN: 50.0000 mL | Freq: Once | INTRAVENOUS | Status: DC

## 2017-01-11 MED ORDER — VANCOMYCIN HCL IN DEXTROSE 1-5 GM/200ML-% IV SOLN: 1000.0000 mg | Freq: Once | INTRAVENOUS | Status: DC

## 2017-01-11 MED ORDER — ACETAMINOPHEN 325 MG PO TABS
650.0000 mg | ORAL_TABLET | Freq: Once | ORAL | Status: AC
  Administered 2017-01-11: 650 mg via ORAL

## 2017-01-11 MED ORDER — METOPROLOL SUCCINATE ER 50 MG PO TB24
50.0000 mg | ORAL_TABLET | Freq: Every day | ORAL | Status: DC
  Administered 2017-01-12 – 2017-01-14 (×3): 50 mg via ORAL
  Filled 2017-01-11 (×3): qty 1

## 2017-01-11 MED ORDER — LOSARTAN POTASSIUM 25 MG PO TABS
25.0000 mg | ORAL_TABLET | Freq: Every day | ORAL | Status: DC
  Administered 2017-01-12 – 2017-01-14 (×3): 25 mg via ORAL
  Filled 2017-01-11 (×3): qty 1

## 2017-01-11 MED ORDER — INSULIN ASPART 100 UNIT/ML ~~LOC~~ SOLN
0.0000 [IU] | Freq: Three times a day (TID) | SUBCUTANEOUS | Status: DC
  Administered 2017-01-12: 2 [IU] via SUBCUTANEOUS

## 2017-01-11 MED ORDER — PIPERACILLIN-TAZOBACTAM 3.375 G IVPB
3.3750 g | Freq: Three times a day (TID) | INTRAVENOUS | Status: DC
  Administered 2017-01-12 – 2017-01-13 (×3): 3.375 g via INTRAVENOUS
  Filled 2017-01-11 (×5): qty 50

## 2017-01-11 MED ORDER — VANCOMYCIN HCL 10 G IV SOLR
1500.0000 mg | Freq: Two times a day (BID) | INTRAVENOUS | Status: DC
  Administered 2017-01-11 – 2017-01-12 (×3): 1500 mg via INTRAVENOUS
  Filled 2017-01-11 (×4): qty 1500

## 2017-01-11 MED ORDER — ROSUVASTATIN CALCIUM 10 MG PO TABS
10.0000 mg | ORAL_TABLET | Freq: Every day | ORAL | Status: DC
  Administered 2017-01-12 – 2017-01-14 (×3): 10 mg via ORAL
  Filled 2017-01-11 (×3): qty 1

## 2017-01-11 NOTE — ED Triage Notes (Signed)
Pt reports she has been running a fever at home. She reports she has right leg swelling and redness. She has hx of cellulitis. Highest reported temp was 102.9

## 2017-01-11 NOTE — Progress Notes (Addendum)
Patient arrived to unit with no complaints, patient oriented to unit and call bell in reach. Patient had a fluid bolus running and Vancomycin running. Temp. 101.6, Tylenol given.

## 2017-01-11 NOTE — ED Notes (Signed)
Informed Dr. Ashok Cordia and triage nurse of lactic acid 2.87

## 2017-01-11 NOTE — H&P (Signed)
History and Physical    Alexandra Beck [id number] DOB: [birth year] DOA: 01/11/2017  PCP: Mauricio Po, FNP  Patient coming from: Home.  Chief Complaint: Right lower extremity erythema with fever and chills.  HPI: Alexandra Beck is a 36 y.o. female with history of extreme morbid obesity, OHS/OSA, physical inactivity, hypertension, hyperlipidemia, hypothyroidism, anemia, DM, hypothyroidism, GERD, recurrent cellulitis, venous insufficiency, chronic diastolic CHF presents with complaints of fever chills with right lower extremity erythema. Patient has been experiencing this for last 24 hours. Denies any trauma or insect bite. In the ER on exam patient has significant erythema extending from the right ankle up to the inferior aspect of the right knee. Patient was febrile with lactate elevated.    ED Course: Patient was given fluid boluses and started on empiric antibiotics for cellulitis.  Review of Systems: As per HPI, rest all negative.   Past Medical History:  Diagnosis Date  . Acute renal failure (ARF) ([place]) 11/2012    multifactorial-likely secondary to ATN in the setting of sepsis and hypotension, and also from rhabdomyolysis  . Anemia   . Chronic diastolic CHF (congestive heart failure) ([employer])   . Depression   . Diabetes mellitus ([place])   . GERD (gastroesophageal reflux disease)   . Hyperlipidemia   . Hypertension   . Hypothyroidism   . Morbid obesity with BMI of 70 and over, adult ([place])   . Obesity hypoventilation syndrome ([place])   . OSA (obstructive sleep apnea)   . PVC's (premature ventricular contractions)   . Recurrent cellulitis of lower leg    LLE, venous insuff  . Sepsis ([place]) 11/2012   Secondary to cellulitis  . Venous insufficiency of leg     Past Surgical History:  Procedure Laterality Date  . WISDOM TOOTH EXTRACTION       reports that she has quit smoking. She quit after 0.00 years of use. She has never used smokeless tobacco. She reports that she does not  drink alcohol or use drugs.  Allergies  Allergen Reactions  . Aspirin     REACTION: throat swelling, hives  . Doxycycline Other (See Comments)    Abdominal pain  . Lisinopril Cough  . Niaspan [Niacin Er]     Caused flushing    Family History  Problem Relation Age of Onset  . Hypertension Mother   . Hypertension Father   . Heart disease Father     before age 7    Prior to Admission medications   Medication Sig Start Date End Date Taking? Authorizing Provider  furosemide (LASIX) 40 MG tablet Take 60 mg by mouth 2 (two) times daily.   Yes Historical Provider, MD  JARDIANCE 10 MG TABS tablet TAKE 1 TABLET BY MOUTH DAILY 01/05/17  Yes Golden Circle, FNP  levothyroxine (SYNTHROID, LEVOTHROID) 150 MCG tablet TAKE 1 TABLET BY MOUTH EVERY MORNING BEFORE BREAKFAST 01/05/17  Yes Golden Circle, FNP  losartan (COZAAR) 25 MG tablet Take 25 mg by mouth daily.   Yes Historical Provider, MD  metolazone (ZAROXOLYN) 2.5 MG tablet Take 1 tablet by mouth 1 time a week on Fridays before taking lasix 12/20/16  Yes Dayna N Dunn, PA-C  metoprolol succinate (TOPROL-XL) 50 MG 24 hr tablet TAKE 1 TABLET BY MOUTH EVERY DAY WITH OR FOLLOWING A MEAL 01/05/17  Yes Golden Circle, FNP  potassium chloride SA (K-DUR,KLOR-CON) 20 MEQ tablet Take 2 tablets (40 mEq total) by mouth daily. 01/06/17  Yes Dayna N Dunn, PA-C  PREVIDENT 5000  BOOSTER PLUS 1.1 % PSTE BRUSH ON TEETH BID FOR 2 MINS EACH TIME. EXPECTORATE AS MUCH AS POSSIBLE THEN DO NOT SWISH EAT OR DRINK FOR 30 MINS AFTER TREATMENT 06/24/16  Yes Historical Provider, MD  rosuvastatin (CRESTOR) 10 MG tablet Take 1 tablet (10 mg total) by mouth daily. 10/25/16  Yes Golden Circle, FNP  furosemide (LASIX) 40 MG tablet Take 1.5 tablets (60 mg total) by mouth 2 (two) times daily. 10/07/16 01/05/17  Brittainy M Simmons, PA-C  metolazone (ZAROXOLYN) 2.5 MG tablet TAKE 1 TABLET BY MOUTH TWO TIMES A WEEK ON THURSDAYS AND SUNDAYS BEFORE TAKING LASIX Patient not taking:  Reported on 01/11/2017 01/05/17   Charlie Pitter, PA-C    Physical Exam: Vitals:   01/11/17 2224 01/11/17 2226 01/11/17 2231 01/11/17 2246  BP: (!) 114/53 (!) 114/53    Pulse: 85 84    Resp: 15 20    Temp:      TempSrc:      SpO2:  94% 90% 94%      Constitutional: Obese not in distress. Vitals:   01/11/17 2224 01/11/17 2226 01/11/17 2231 01/11/17 2246  BP: (!) 114/53 (!) 114/53    Pulse: 85 84    Resp: 15 20    Temp:      TempSrc:      SpO2:  94% 90% 94%   Eyes: Anicteric. no pallor. ENMT: No discharge from the ears eyes nose or mouth. Neck: No mass felt. No neck rigidity. Respiratory: No rhonchi or crepitations. Cardiovascular: S1 and S2 heard no murmurs appreciated. Abdomen: Soft nontender bowel sounds present. Musculoskeletal: Mild swelling of the right lower extremity. Skin: Erythema extending from the right and follow-up to the right knee. Neurologic: Alert awake oriented to time place and person. Moves all extremities. Psychiatric: Appears normal. Normal affect.   Labs on Admission: I have personally reviewed following labs and imaging studies  CBC:  Recent Labs Lab 01/11/17 1815  WBC 13.9*  NEUTROABS 12.5*  HGB 13.5  HCT 43.6  MCV 90.3  PLT 99991111   Basic Metabolic Panel:  Recent Labs Lab 01/11/17 1815  NA 139  K 4.2  CL 98*  CO2 29  GLUCOSE 154*  BUN 16  CREATININE 1.16*  CALCIUM 9.1   GFR: CrCl cannot be calculated (Unknown ideal weight.). Liver Function Tests:  Recent Labs Lab 01/11/17 1815  AST 18  ALT 14  ALKPHOS 59  BILITOT 1.6*  PROT 7.0  ALBUMIN 3.2*   No results for input(s): LIPASE, AMYLASE in the last 168 hours. No results for input(s): AMMONIA in the last 168 hours. Coagulation Profile:  Recent Labs Lab 01/11/17 1815  INR 1.13   Cardiac Enzymes: No results for input(s): CKTOTAL, CKMB, CKMBINDEX, TROPONINI in the last 168 hours. BNP (last 3 results)  Recent Labs  08/30/16 1413  PROBNP 641.0*   HbA1C: No  results for input(s): HGBA1C in the last 72 hours. CBG: No results for input(s): GLUCAP in the last 168 hours. Lipid Profile: No results for input(s): CHOL, HDL, LDLCALC, TRIG, CHOLHDL, LDLDIRECT in the last 72 hours. Thyroid Function Tests: No results for input(s): TSH, T4TOTAL, FREET4, T3FREE, THYROIDAB in the last 72 hours. Anemia Panel: No results for input(s): VITAMINB12, FOLATE, FERRITIN, TIBC, IRON, RETICCTPCT in the last 72 hours. Urine analysis:    Component Value Date/Time   COLORURINE YELLOW 08/26/2016 [place] 08/26/2016 0449   LABSPEC 1.013 08/26/2016 0449   PHURINE 5.5 08/26/2016 0449   GLUCOSEU NEGATIVE  08/26/2016 0449   HGBUR TRACE (A) 08/26/2016 0449   BILIRUBINUR NEGATIVE 08/26/2016 0449   KETONESUR NEGATIVE 08/26/2016 0449   PROTEINUR 100 (A) 08/26/2016 0449   UROBILINOGEN 0.2 07/25/2014 1040   NITRITE NEGATIVE 08/26/2016 0449   LEUKOCYTESUR NEGATIVE 08/26/2016 0449   Sepsis Labs: @LABRCNTIP (procalcitonin:4,lacticidven:4) )No results found for this or any previous visit (from the past 240 hour(s)).   Radiological Exams on Admission: No results found.    Assessment/Plan Principal Problem:   Cellulitis of right leg Active Problems:   Essential hypertension   OSA (obstructive sleep apnea)   Diabetes mellitus type 2 in obese (HCC)   Hypothyroidism   Chronic diastolic CHF (congestive heart failure) ([place])    1. Right leg cellulitis - since patient is febrile with elevated lactate and progressing erythema of the right lower extremity at this time we will admit for observation and keep patient on IV antibiotics. Patient did receive fluid boluses in the ER and we'll recheck lactate levels follow blood cultures. No signs of any compartment syndrome. 2. History of diastolic CHF - holding Lasix for now and patient received fluid bolus. Closely observe for restarting diuretics. 3. Diabetes mellitus type 2 - patient states her hemoglobin A1c is  well under control. Does not know the exact number. Continue Jardiance with sliding scale coverage. 4. Hypothyroidism on Synthroid. 5. Hypertension on Metoprolol. 6. Sleep apnea on CPAP.   DVT prophylaxis: Lovenox. Code Status: Full code.  Family Communication: Patient's mother.  Disposition Plan: Home.  Consults called: None.  Admission status: Observation.    Rise Patience MD [facility] Pager [phone].  If 7PM-7AM, please contact night-coverage [email or web address] Password [facility]  01/11/2017, 10:57 PM

## 2017-01-11 NOTE — ED Provider Notes (Signed)
[facility] DEPT Provider Note   CSN: [id number] Arrival date & time: 01/11/17  1754     History   Chief Complaint Chief Complaint  Patient presents with  . Fever  . Cellulitis    HPI SHUNTIA COPAS is a 36 y.o. female.  HPI   Right leg erythema began yesterday, with associated fevers and chills. Reports she's had multiple episodes of cellulitis in the past, between 7-11 episodes. Reports that one time they tried to manage her symptoms outpatient, however worsened and she had to be admitted. Reports a history of congestive heart failure or which they have been changing her medications including metolazone. Denies any current shortness of breath. Erythema of lower leg worsening since yesterday.  Past Medical History:  Diagnosis Date  . Acute renal failure (ARF) ([place]) 11/2012    multifactorial-likely secondary to ATN in the setting of sepsis and hypotension, and also from rhabdomyolysis  . Anemia   . Chronic diastolic CHF (congestive heart failure) ([place])   . Depression   . Diabetes mellitus ([place])   . GERD (gastroesophageal reflux disease)   . Hyperlipidemia   . Hypertension   . Hypothyroidism   . Morbid obesity with BMI of 70 and over, adult ([place])   . Obesity hypoventilation syndrome ([place])   . OSA (obstructive sleep apnea)   . PVC's (premature ventricular contractions)   . Recurrent cellulitis of lower leg    LLE, venous insuff  . Sepsis ([place]) 11/2012   Secondary to cellulitis  . Venous insufficiency of leg     Patient Active Problem List   Diagnosis Date Noted  . Cellulitis of right leg 01/11/2017  . Abnormal EKG 12/16/2016  . PVC's (premature ventricular contractions) 12/02/2016  . Chronic diastolic CHF (congestive heart failure) ([place])   . Sepsis ([place]) 08/26/2016  . Acute kidney injury ([place])   . Leg edema, left 06/10/2015  . Sepsis, unspecified organism ([place]) 07/23/2014  . Hyperlipidemia   . Diabetes mellitus type 2 in obese ([place])   . Hypothyroidism   .  GERD (gastroesophageal reflux disease)   . Depression   . Venous insufficiency of leg   . Recurrent cellulitis of lower leg 12/25/2012  . OSA (obstructive sleep apnea) 12/25/2012  . Obesity hypoventilation syndrome ([place]) 12/25/2012  . Morbid obesity with BMI of 70 and over, adult ([place]) 12/24/2012  . Essential hypertension 06/26/2007    Past Surgical History:  Procedure Laterality Date  . WISDOM TOOTH EXTRACTION      OB History    No data available       Home Medications    Prior to Admission medications   Medication Sig Start Date End Date Taking? Authorizing Provider  furosemide (LASIX) 40 MG tablet Take 60 mg by mouth 2 (two) times daily.   Yes Historical Provider, MD  JARDIANCE 10 MG TABS tablet TAKE 1 TABLET BY MOUTH DAILY 01/05/17  Yes Golden Circle, FNP  levothyroxine (SYNTHROID, LEVOTHROID) 150 MCG tablet TAKE 1 TABLET BY MOUTH EVERY MORNING BEFORE BREAKFAST 01/05/17  Yes Golden Circle, FNP  losartan (COZAAR) 25 MG tablet Take 25 mg by mouth daily.   Yes Historical Provider, MD  metolazone (ZAROXOLYN) 2.5 MG tablet Take 1 tablet by mouth 1 time a week on Fridays before taking lasix 12/20/16  Yes Dayna N Dunn, PA-C  metoprolol succinate (TOPROL-XL) 50 MG 24 hr tablet TAKE 1 TABLET BY MOUTH EVERY DAY WITH OR FOLLOWING A MEAL 01/05/17  Yes Golden Circle, FNP  potassium chloride  SA (K-DUR,KLOR-CON) 20 MEQ tablet Take 2 tablets (40 mEq total) by mouth daily. 01/06/17  Yes Dayna N Dunn, PA-C  PREVIDENT 5000 BOOSTER PLUS 1.1 % PSTE BRUSH ON TEETH BID FOR 2 MINS EACH TIME. EXPECTORATE AS MUCH AS POSSIBLE THEN DO NOT SWISH EAT OR DRINK FOR 30 MINS AFTER TREATMENT 06/24/16  Yes Historical Provider, MD  rosuvastatin (CRESTOR) 10 MG tablet Take 1 tablet (10 mg total) by mouth daily. 10/25/16  Yes Golden Circle, FNP  furosemide (LASIX) 40 MG tablet Take 1.5 tablets (60 mg total) by mouth 2 (two) times daily. 10/07/16 01/05/17  Brittainy M Simmons, PA-C  metolazone (ZAROXOLYN) 2.5 MG  tablet TAKE 1 TABLET BY MOUTH TWO TIMES A WEEK ON THURSDAYS AND SUNDAYS BEFORE TAKING LASIX Patient not taking: Reported on 01/11/2017 01/05/17   Charlie Pitter, PA-C    Family History Family History  Problem Relation Age of Onset  . Hypertension Mother   . Hypertension Father   . Heart disease Father     before age 56    Social History Social History  Substance Use Topics  . Smoking status: Former Smoker    Years: 0.00  . Smokeless tobacco: Never Used  . Alcohol use No     Allergies   Aspirin; Doxycycline; Lisinopril; and Niaspan [niacin er]   Review of Systems Review of Systems  Constitutional: Positive for chills and fever.  HENT: Negative for sore throat.   Eyes: Negative for visual disturbance.  Respiratory: Negative for cough and shortness of breath.   Cardiovascular: Negative for chest pain.  Gastrointestinal: Negative for abdominal pain, diarrhea, nausea and vomiting.  Genitourinary: Negative for difficulty urinating.  Musculoskeletal: Negative for back pain and neck pain.  Skin: Positive for rash.  Neurological: Negative for syncope and headaches.     Physical Exam Updated Vital Signs BP 140/68 (BP Location: Left Arm)   Pulse 88   Temp (!) 101.6 F (38.7 C) (Oral)   Resp 18   Ht 5\' 2"  (1.575 m)   Wt (!) 459 lb 1.6 oz (208.2 kg) Comment: scale a  LMP 01/09/2017 (Within Days)   SpO2 94%   BMI 83.97 kg/m   Physical Exam  Constitutional: She is oriented to person, place, and time. She appears well-developed and well-nourished. No distress.  Morbid obesity   HENT:  Head: Normocephalic and atraumatic.  Eyes: Conjunctivae and EOM are normal.  Neck: Normal range of motion.  Cardiovascular: Normal rate, regular rhythm, normal heart sounds and intact distal pulses.  Exam reveals no gallop and no friction rub.   No murmur heard. Distant heart sounds  Pulmonary/Chest: Effort normal and breath sounds normal. No respiratory distress. She has no wheezes. She  has no rales.  Abdominal: Soft. She exhibits no distension. There is no tenderness. There is no guarding.  Musculoskeletal: She exhibits edema (bilateral). She exhibits no tenderness.  Neurological: She is alert and oriented to person, place, and time.  Skin: Skin is warm and dry. No rash noted. She is not diaphoretic. There is erythema (lower leg).  Nursing note and vitals reviewed.    ED Treatments / Results  Labs (all labs ordered are listed, but only abnormal results are displayed) Labs Reviewed  COMPREHENSIVE METABOLIC PANEL - Abnormal; Notable for the following:       Result Value   Chloride 98 (*)    Glucose, Bld 154 (*)    Creatinine, Ser 1.16 (*)    Albumin 3.2 (*)    Total Bilirubin  1.6 (*)    GFR calc non Af Amer 60 (*)    All other components within normal limits  CBC WITH DIFFERENTIAL/PLATELET - Abnormal; Notable for the following:    WBC 13.9 (*)    RDW 17.2 (*)    Neutro Abs 12.5 (*)    All other components within normal limits  GLUCOSE, CAPILLARY - Abnormal; Notable for the following:    Glucose-Capillary 140 (*)    All other components within normal limits  I-STAT CG4 LACTIC ACID, ED - Abnormal; Notable for the following:    Lactic Acid, Venous 2.87 (*)    All other components within normal limits  CULTURE, BLOOD (ROUTINE X 2)  CULTURE, BLOOD (ROUTINE X 2)  PROTIME-INR  BASIC METABOLIC PANEL  CBC  I-STAT BETA HCG BLOOD, ED ([facility], [facility], [facility] ONLY)  I-STAT CG4 LACTIC ACID, ED    EKG  EKG Interpretation None       Radiology No results found.  Procedures Procedures (including critical care time)  Medications Ordered in ED Medications  acetaminophen (TYLENOL) 325 MG tablet (not administered)  vancomycin (VANCOCIN) IVPB 1000 mg/200 mL premix (1,000 mg Intravenous Not Given 01/11/17 2235)  vancomycin (VANCOCIN) 1,500 mg in sodium chloride 0.9 % 500 mL IVPB (1,500 mg Intravenous New Bag/Given 01/11/17 2219)  piperacillin-tazobactam (ZOSYN) IVPB 3.375 g  (not administered)  canagliflozin (INVOKANA) tablet 100 mg (not administered)  levothyroxine (SYNTHROID, LEVOTHROID) tablet 150 mcg (not administered)  metoprolol succinate (TOPROL-XL) 24 hr tablet 50 mg (not administered)  losartan (COZAAR) tablet 25 mg (not administered)  rosuvastatin (CRESTOR) tablet 10 mg (not administered)  acetaminophen (TYLENOL) tablet 650 mg (650 mg Oral Given 01/12/17 0007)    Or  acetaminophen (TYLENOL) suppository 650 mg ( Rectal See Alternative 01/12/17 0007)  ondansetron (ZOFRAN) tablet 4 mg (not administered)    Or  ondansetron (ZOFRAN) injection 4 mg (not administered)  insulin aspart (novoLOG) injection 0-9 Units (not administered)  enoxaparin (LOVENOX) injection 100 mg (not administered)  acetaminophen (TYLENOL) tablet 650 mg (650 mg Oral Given 01/11/17 1808)  piperacillin-tazobactam (ZOSYN) IVPB 3.375 g (0 g Intravenous Stopped 01/11/17 2203)  sodium chloride 0.9 % bolus 1,000 mL (1,000 mLs Intravenous New Bag/Given 01/11/17 2132)     Initial Impression / Assessment and Plan / ED Course  I have reviewed the triage vital signs and the nursing notes.  Pertinent labs & imaging results that were available during my care of the patient were reviewed by me and considered in my medical decision making (see chart for details).    36 year old female with a history of morbid obesity, diabetes, hypertension, hyperlipidemia, OSA, obesity hypoventilation syndrome, diastolic CHF, who presents with concern for right lower extremity erythema and fevers. Patient with temperature 100.3 on arrival to the emergency department. Lactic acid elevated to 2.8. Patient with signs cellulitis on exam. No other history to suggest other etiologies of fever or infection.  Suspect sepsis secondary to cellulitis.  Given lactate less than 4, blood pressures within normal limits, did not order 30 mL/kg of fluid for patient. Discussed fluid resuscitation with her as she is concerned regarding  history of congestive heart failure. Ordered 2 L of normal saline (10cc/kg) and vancomycin/zosyn. Repeat lactic acid within normal limits. Patient admitted for further care.    Final Clinical Impressions(s) / ED Diagnoses   Final diagnoses:  Cellulitis of right lower extremity  Sepsis, due to unspecified organism [facility])    New Prescriptions Current Discharge Medication List  Gareth Morgan, MD 01/12/17 [phone]

## 2017-01-11 NOTE — ED Notes (Addendum)
Pt reports that she has CHF and has been told by her cardiologist not to have continuous boluses of fluids. Pt also states she is a hard stick and requests ultrasound IV. EDP aware.

## 2017-01-11 NOTE — ED Notes (Signed)
IV team at the bedside. 

## 2017-01-11 NOTE — Progress Notes (Signed)
Pharmacy Antibiotic Note ZAYAH CATOE is a 36 y.o. female admitted on 01/11/2017 with cellulitis.  Pharmacy has been consulted for Zosyn and vancomycin dosing.  Plan: 1. Zosyn 3.375g IV q8h (4 hour infusion).  2. Vancomycin 1500 mg IV every 12 hours; goal trough 10-20. 3. SCr every 72 hours while on vancomycin.    Temp (24hrs), Avg:100.3 F (37.9 C), Min:100.3 F (37.9 C), Max:100.3 F (37.9 C)   Recent Labs Lab 01/11/17 1815 01/11/17 1829  WBC 13.9*  --   CREATININE 1.16*  --   LATICACIDVEN  --  2.87*    CrCl cannot be calculated (Unknown ideal weight.).    Allergies  Allergen Reactions  . Aspirin     REACTION: throat swelling, hives  . Doxycycline Other (See Comments)    Abdominal pain  . Lisinopril Cough  . Niaspan [Niacin Er]     Caused flushing    Antimicrobials this admission: 2/14 Zosyn >>  2/14 vancomycin >>   Dose adjustments this admission: n/a  Microbiology results: 2/14 BCx: px  Vincenza Hews, PharmD, BCPS 01/11/2017, 8:10 PM

## 2017-01-12 DIAGNOSIS — G4733 Obstructive sleep apnea (adult) (pediatric): Secondary | ICD-10-CM | POA: Diagnosis present

## 2017-01-12 DIAGNOSIS — L03115 Cellulitis of right lower limb: Secondary | ICD-10-CM

## 2017-01-12 DIAGNOSIS — I872 Venous insufficiency (chronic) (peripheral): Secondary | ICD-10-CM | POA: Diagnosis present

## 2017-01-12 DIAGNOSIS — Z886 Allergy status to analgesic agent status: Secondary | ICD-10-CM | POA: Diagnosis not present

## 2017-01-12 DIAGNOSIS — Z888 Allergy status to other drugs, medicaments and biological substances status: Secondary | ICD-10-CM | POA: Diagnosis not present

## 2017-01-12 DIAGNOSIS — K219 Gastro-esophageal reflux disease without esophagitis: Secondary | ICD-10-CM | POA: Diagnosis present

## 2017-01-12 DIAGNOSIS — Z8249 Family history of ischemic heart disease and other diseases of the circulatory system: Secondary | ICD-10-CM | POA: Diagnosis not present

## 2017-01-12 DIAGNOSIS — Z7984 Long term (current) use of oral hypoglycemic drugs: Secondary | ICD-10-CM | POA: Diagnosis not present

## 2017-01-12 DIAGNOSIS — E119 Type 2 diabetes mellitus without complications: Secondary | ICD-10-CM | POA: Diagnosis present

## 2017-01-12 DIAGNOSIS — A419 Sepsis, unspecified organism: Secondary | ICD-10-CM | POA: Diagnosis present

## 2017-01-12 DIAGNOSIS — Z87891 Personal history of nicotine dependence: Secondary | ICD-10-CM | POA: Diagnosis not present

## 2017-01-12 DIAGNOSIS — Z6841 Body Mass Index (BMI) 40.0 and over, adult: Secondary | ICD-10-CM | POA: Diagnosis not present

## 2017-01-12 DIAGNOSIS — I5032 Chronic diastolic (congestive) heart failure: Secondary | ICD-10-CM | POA: Diagnosis present

## 2017-01-12 DIAGNOSIS — E039 Hypothyroidism, unspecified: Secondary | ICD-10-CM | POA: Diagnosis present

## 2017-01-12 DIAGNOSIS — Z79899 Other long term (current) drug therapy: Secondary | ICD-10-CM | POA: Diagnosis not present

## 2017-01-12 DIAGNOSIS — I110 Hypertensive heart disease with heart failure: Secondary | ICD-10-CM | POA: Diagnosis present

## 2017-01-12 DIAGNOSIS — E662 Morbid (severe) obesity with alveolar hypoventilation: Secondary | ICD-10-CM | POA: Diagnosis present

## 2017-01-12 DIAGNOSIS — E785 Hyperlipidemia, unspecified: Secondary | ICD-10-CM | POA: Diagnosis present

## 2017-01-12 LAB — GLUCOSE, CAPILLARY
GLUCOSE-CAPILLARY: 122 mg/dL — AB (ref 65–99)
GLUCOSE-CAPILLARY: 93 mg/dL (ref 65–99)
Glucose-Capillary: 167 mg/dL — ABNORMAL HIGH (ref 65–99)
Glucose-Capillary: 137 mg/dL — ABNORMAL HIGH (ref 65–99)

## 2017-01-12 LAB — BASIC METABOLIC PANEL
Anion gap: 8 (ref 5–15)
BUN: 17 mg/dL (ref 6–20)
CALCIUM: 8.7 mg/dL — AB (ref 8.9–10.3)
CHLORIDE: 100 mmol/L — AB (ref 101–111)
CO2: 30 mmol/L (ref 22–32)
CREATININE: 1.21 mg/dL — AB (ref 0.44–1.00)
GFR calc Af Amer: >60 mL/min (ref >60)
GFR calc non Af Amer: 57 mL/min — ABNORMAL LOW (ref >60)
Glucose, Bld: 120 mg/dL — ABNORMAL HIGH (ref 65–99)
Potassium: 3.6 mmol/L (ref 3.5–5.1)
SODIUM: 138 mmol/L (ref 135–145)

## 2017-01-12 LAB — CBC
HCT: 40.1 % (ref 36.0–46.0)
Hemoglobin: 12.4 g/dL (ref 12.0–15.0)
MCH: 27.8 pg (ref 26.0–34.0)
MCHC: 30.9 g/dL (ref 30.0–36.0)
MCV: 89.9 fL (ref 78.0–100.0)
PLATELETS: 178 10*3/uL (ref 150–400)
RBC: 4.46 MIL/uL (ref 3.87–5.11)
RDW: 17.3 % — ABNORMAL HIGH (ref 11.5–15.5)
WBC: 11.6 10*3/uL — AB (ref 4.0–10.5)

## 2017-01-12 MED ORDER — TRAMADOL HCL 50 MG PO TABS
50.0000 mg | ORAL_TABLET | Freq: Four times a day (QID) | ORAL | Status: DC | PRN
  Administered 2017-01-12 (×2): 50 mg via ORAL
  Filled 2017-01-12 (×2): qty 1

## 2017-01-12 NOTE — Progress Notes (Signed)
Pt c/o HA and says tylenol does not help.  MD paged.

## 2017-01-12 NOTE — Progress Notes (Signed)
PROGRESS NOTE    Alexandra Beck  [id number] DOB: September 05, 1981 DOA: 01/11/2017 PCP: Mauricio Po, FNP     Brief Narrative:  Alexandra Beck is a 36 y.o. female with history of extreme morbid obesity, OHS/OSA, physical inactivity, hypertension, hyperlipidemia, hypothyroidism, anemia, DM, hypothyroidism, GERD, recurrent cellulitis, venous insufficiency, chronic diastolic CHF presents with complaints of fever chills with right lower extremity erythema. Patient has been experiencing this for last 24 hours and is admitted for treatment of cellulitis.    Assessment & Plan:   Principal Problem:   Cellulitis of right leg Active Problems:   Essential hypertension   OSA (obstructive sleep apnea)   Diabetes mellitus type 2 in obese (HCC)   Hypothyroidism   Chronic diastolic CHF (congestive heart failure) (HCC)   Sepsis secondary to right leg cellulitis -Recurrent cellulitis bilateral extremities -Blood culture pending  -Continue vanco/zosyn  Chronic diastolic CHF -Holding lasix due to sepsis and fluid resuscitation  DM type 2 -SSI   Hypothyroidism -Continue synthroid  HTN -Continue metoprolol, cozaar   HLD -Continue crestor   OSA -Continue CPAP qhs   DVT prophylaxis: lovenox Code Status: full Family Communication: no family at bedside Disposition Plan: pending further improvement, discharge to home   Consultants:   None  Procedures:   None  Antimicrobials:  Anti-infectives    Start     Dose/Rate Route Frequency Ordered Stop   01/12/17 0400  piperacillin-tazobactam (ZOSYN) IVPB 3.375 g     3.375 g 12.5 mL/hr over 240 Minutes Intravenous Every 8 hours 01/11/17 2006     01/11/17 2300  vancomycin (VANCOCIN) 1,500 mg in sodium chloride 0.9 % 500 mL IVPB     1,500 mg 250 mL/hr over 120 Minutes Intravenous Every 12 hours 01/11/17 2004     01/11/17 1930  piperacillin-tazobactam (ZOSYN) IVPB 3.375 g     3.375 g 100 mL/hr over 30 Minutes Intravenous  Once 01/11/17  1929 01/11/17 2203   01/11/17 1930  vancomycin (VANCOCIN) IVPB 1000 mg/200 mL premix     1,000 mg 200 mL/hr over 60 Minutes Intravenous  Once 01/11/17 1929          Subjective: Patient doing well. Continues to have pain in right lower leg, but able to bear weight. Continues to have fevers. No CP or SOB   Objective: Vitals:   01/12/17 0430 01/12/17 0500 01/12/17 1000 01/12/17 1128  BP: (!) 110/58  (!) 118/51 (!) 142/82  Pulse: 81  84 87  Resp: 18   (!) 22  Temp: (!) 101.2 F (38.4 C) (!) 100.8 F (38.2 C) (!) 100.4 F (38 C) (!) 103.3 F (39.6 C)  TempSrc: Oral  Oral Oral  SpO2: 93%   94%  Weight:      Height:        Intake/Output Summary (Last 24 hours) at 01/12/17 1220 Last data filed at 01/12/17 1130  Gross per 24 hour  Intake             1480 ml  Output             1850 ml  Net             -370 ml   Filed Weights   01/11/17 2351  Weight: (!) 208.2 kg (459 lb 1.6 oz)    Examination:  General exam: Appears calm and comfortable  Respiratory system: Clear to auscultation. Respiratory effort normal. Cardiovascular system: S1 & S2 heard, RRR. No JVD, murmurs, rubs, gallops or clicks.  Gastrointestinal system:  Abdomen is nondistended, soft and nontender. No organomegaly or masses felt. Normal bowel sounds heard. Obese  Central nervous system: Alert and oriented. No focal neurological deficits. Extremities: Symmetric 5 x 5 power. Skin: +right calf and shin with erythema, warm to touch, no drainage/wound/open sores  Psychiatry: Judgement and insight appear normal. Mood & affect appropriate.   Data Reviewed: I have personally reviewed following labs and imaging studies  CBC:  Recent Labs Lab 01/11/17 1815 01/12/17 0327  WBC 13.9* 11.6*  NEUTROABS 12.5*  --   HGB 13.5 12.4  HCT 43.6 40.1  MCV 90.3 89.9  PLT 189 0000000   Basic Metabolic Panel:  Recent Labs Lab 01/11/17 1815 01/12/17 0327  NA 139 138  K 4.2 3.6  CL 98* 100*  CO2 29 30  GLUCOSE 154* 120*    BUN 16 17  CREATININE 1.16* 1.21*  CALCIUM 9.1 8.7*   GFR: Estimated Creatinine Clearance: 115 mL/min (by C-G formula based on SCr of 1.21 mg/dL (H)). Liver Function Tests:  Recent Labs Lab 01/11/17 1815  AST 18  ALT 14  ALKPHOS 59  BILITOT 1.6*  PROT 7.0  ALBUMIN 3.2*   No results for input(s): LIPASE, AMYLASE in the last 168 hours. No results for input(s): AMMONIA in the last 168 hours. Coagulation Profile:  Recent Labs Lab 01/11/17 1815  INR 1.13   Cardiac Enzymes: No results for input(s): CKTOTAL, CKMB, CKMBINDEX, TROPONINI in the last 168 hours. BNP (last 3 results)  Recent Labs  08/30/16 1413  PROBNP 641.0*   HbA1C: No results for input(s): HGBA1C in the last 72 hours. CBG:  Recent Labs Lab 01/11/17 2348 01/12/17 0726 01/12/17 1124  GLUCAP 140* 122* 167*   Lipid Profile: No results for input(s): CHOL, HDL, LDLCALC, TRIG, CHOLHDL, LDLDIRECT in the last 72 hours. Thyroid Function Tests: No results for input(s): TSH, T4TOTAL, FREET4, T3FREE, THYROIDAB in the last 72 hours. Anemia Panel: No results for input(s): VITAMINB12, FOLATE, FERRITIN, TIBC, IRON, RETICCTPCT in the last 72 hours. Sepsis Labs:  Recent Labs Lab 01/11/17 1829 01/11/17 2223  LATICACIDVEN 2.87* 1.28    No results found for this or any previous visit (from the past 240 hour(s)).     Radiology Studies: No results found.    Scheduled Meds: . enoxaparin (LOVENOX) injection  100 mg Subcutaneous Q24H  . insulin aspart  0-9 Units Subcutaneous TID WC  . levothyroxine  150 mcg Oral QAC breakfast  . losartan  25 mg Oral Daily  . metoprolol succinate  50 mg Oral Daily  . piperacillin-tazobactam (ZOSYN)  IV  3.375 g Intravenous Q8H  . rosuvastatin  10 mg Oral Daily  . vancomycin  1,500 mg Intravenous Q12H  . vancomycin  1,000 mg Intravenous Once   Continuous Infusions:   LOS: 0 days    Time spent: 40 minutes   Alexandra Phi, DO [facility] [email or web address] Password [email or web address] 01/12/2017, 12:20 PM

## 2017-01-12 NOTE — Progress Notes (Signed)
Patient stated that she will place her CPAP on herself tonight. RT informed patient if she needed assistance to have RN call RT. RT will continue to monitor as needed.

## 2017-01-12 NOTE — Progress Notes (Signed)
Arrived to discuss PICC placement with patient. Patient states having a PICC previously and that she "doesn't want one." Primary RN Legrand Como notified.

## 2017-01-13 LAB — BASIC METABOLIC PANEL
ANION GAP: 8 (ref 5–15)
BUN: 10 mg/dL (ref 6–20)
CHLORIDE: 103 mmol/L (ref 101–111)
CO2: 27 mmol/L (ref 22–32)
Calcium: 9 mg/dL (ref 8.9–10.3)
Creatinine, Ser: 1.04 mg/dL — ABNORMAL HIGH (ref 0.44–1.00)
Glucose, Bld: 89 mg/dL (ref 65–99)
POTASSIUM: 3.8 mmol/L (ref 3.5–5.1)
SODIUM: 138 mmol/L (ref 135–145)

## 2017-01-13 LAB — CBC WITH DIFFERENTIAL/PLATELET
BASOS ABS: 0 10*3/uL (ref 0.0–0.1)
BASOS PCT: 0 %
Eosinophils Absolute: 0.1 10*3/uL (ref 0.0–0.7)
Eosinophils Relative: 1 %
HCT: 38 % (ref 36.0–46.0)
HEMOGLOBIN: 11.6 g/dL — AB (ref 12.0–15.0)
LYMPHS ABS: 1.3 10*3/uL (ref 0.7–4.0)
Lymphocytes Relative: 14 %
MCH: 27.7 pg (ref 26.0–34.0)
MCHC: 30.5 g/dL (ref 30.0–36.0)
MCV: 90.7 fL (ref 78.0–100.0)
Monocytes Absolute: 0.7 10*3/uL (ref 0.1–1.0)
Monocytes Relative: 7 %
NEUTROS PCT: 78 %
Neutro Abs: 7.5 10*3/uL (ref 1.7–7.7)
Platelets: 163 10*3/uL (ref 150–400)
RBC: 4.19 MIL/uL (ref 3.87–5.11)
RDW: 17.7 % — ABNORMAL HIGH (ref 11.5–15.5)
WBC: 9.7 10*3/uL (ref 4.0–10.5)

## 2017-01-13 LAB — GLUCOSE, CAPILLARY
GLUCOSE-CAPILLARY: 94 mg/dL (ref 65–99)
GLUCOSE-CAPILLARY: 108 mg/dL — AB (ref 65–99)
GLUCOSE-CAPILLARY: 129 mg/dL — AB (ref 65–99)
Glucose-Capillary: 119 mg/dL — ABNORMAL HIGH (ref 65–99)

## 2017-01-13 MED ORDER — FUROSEMIDE 40 MG PO TABS
60.0000 mg | ORAL_TABLET | Freq: Two times a day (BID) | ORAL | Status: DC
  Administered 2017-01-13 – 2017-01-14 (×3): 60 mg via ORAL
  Filled 2017-01-13 (×3): qty 1

## 2017-01-13 MED ORDER — CEPHALEXIN 500 MG PO CAPS
500.0000 mg | ORAL_CAPSULE | Freq: Four times a day (QID) | ORAL | Status: DC
Start: 2017-01-13 — End: 2017-01-14
  Administered 2017-01-13 – 2017-01-14 (×6): 500 mg via ORAL
  Filled 2017-01-13 (×6): qty 1

## 2017-01-13 NOTE — Progress Notes (Signed)
Pt is alert and oriented with a low grade temp of 100.6. MD paged to be made aware. No New orders.

## 2017-01-13 NOTE — Progress Notes (Signed)
Pt is currently asleep on C-pap plan to sit- up when Mom come to visit.

## 2017-01-13 NOTE — Progress Notes (Signed)
PROGRESS NOTE    Alexandra Beck  [id number] DOB: 02/11/1981 DOA: 01/11/2017 PCP: Mauricio Po, FNP     Brief Narrative:  Alexandra Beck is a 36 y.o. female with history of extreme morbid obesity, OHS/OSA, physical inactivity, hypertension, hyperlipidemia, hypothyroidism, anemia, DM, hypothyroidism, GERD, recurrent cellulitis, venous insufficiency, chronic diastolic CHF presents with complaints of fever chills with right lower extremity erythema. Patient has been experiencing this for last 24 hours and is admitted for treatment of cellulitis.   Assessment & Plan:   Principal Problem:   Cellulitis of right leg Active Problems:   Essential hypertension   OSA (obstructive sleep apnea)   Diabetes mellitus type 2 in obese (HCC)   Hypothyroidism   Chronic diastolic CHF (congestive heart failure) (HCC)   Sepsis secondary to right leg cellulitis -Recurrent cellulitis bilateral extremities -Blood culture pending  -Patient's IV was not working yesterday and PICC was ordered. However, patient refused PICC. Will switch antibiotics to Keflex today and continue to monitor cellulitis, vital signs   Chronic diastolic CHF -Restart lasix today   DM type 2 -SSI   Hypothyroidism -Continue synthroid  HTN -Continue metoprolol, cozaar   HLD -Continue crestor   OSA -Continue CPAP qhs   DVT prophylaxis: lovenox Code Status: full Family Communication: no family at bedside Disposition Plan: pending further improvement, discharge to home   Consultants:   None  Procedures:   None  Antimicrobials:  Anti-infectives    Start     Dose/Rate Route Frequency Ordered Stop   01/13/17 0800  cephALEXin (KEFLEX) capsule 500 mg     500 mg Oral Every 6 hours 01/13/17 0724 01/18/17 0559   01/12/17 0400  piperacillin-tazobactam (ZOSYN) IVPB 3.375 g  Status:  Discontinued     3.375 g 12.5 mL/hr over 240 Minutes Intravenous Every 8 hours 01/11/17 2006 01/13/17 0724   01/11/17 2300  vancomycin  (VANCOCIN) 1,500 mg in sodium chloride 0.9 % 500 mL IVPB  Status:  Discontinued     1,500 mg 250 mL/hr over 120 Minutes Intravenous Every 12 hours 01/11/17 2004 01/13/17 0724   01/11/17 1930  piperacillin-tazobactam (ZOSYN) IVPB 3.375 g     3.375 g 100 mL/hr over 30 Minutes Intravenous  Once 01/11/17 1929 01/11/17 2203   01/11/17 1930  vancomycin (VANCOCIN) IVPB 1000 mg/200 mL premix  Status:  Discontinued     1,000 mg 200 mL/hr over 60 Minutes Intravenous  Once 01/11/17 1929 01/13/17 0724       Subjective: Patient doing well. No new complaints. Last fever was last night   Objective: Vitals:   01/13/17 0000 01/13/17 0448 01/13/17 0600 01/13/17 0801  BP: (!) 143/76 (!) 112/49  (!) 141/78  Pulse: 76 75  73  Resp: 20 18    Temp:  98.7 F (37.1 C) 98.9 F (37.2 C)   TempSrc:  Oral Oral   SpO2: 98% 94%    Weight:   (!) 208.2 kg (458 lb 14.4 oz)   Height:        Intake/Output Summary (Last 24 hours) at 01/13/17 0924 Last data filed at 01/13/17 0600  Gross per 24 hour  Intake             1830 ml  Output             4750 ml  Net            -2920 ml   Filed Weights   01/11/17 2351 01/13/17 0600  Weight: (!) 208.2 kg (459 lb  1.6 oz) (!) 208.2 kg (458 lb 14.4 oz)    Examination:  General exam: Appears calm and comfortable  Respiratory system: Clear to auscultation. Respiratory effort normal. Cardiovascular system: S1 & S2 heard, RRR. No JVD, murmurs, rubs, gallops or clicks.  Gastrointestinal system: Abdomen is nondistended, soft and nontender. No organomegaly or masses felt. Normal bowel sounds heard. Obese  Central nervous system: Alert and oriented. No focal neurological deficits. Extremities: Symmetric 5 x 5 power. Skin: +right calf and shin with erythema, warm to touch, no drainage/wound/open sores  Psychiatry: Judgement and insight appear normal. Mood & affect appropriate.   Data Reviewed: I have personally reviewed following labs and imaging studies  CBC:  Recent  Labs Lab 01/11/17 1815 01/12/17 0327 01/13/17 0438  WBC 13.9* 11.6* 9.7  NEUTROABS 12.5*  --  7.5  HGB 13.5 12.4 11.6*  HCT 43.6 40.1 38.0  MCV 90.3 89.9 90.7  PLT 189 178 [id number]   Basic Metabolic Panel:  Recent Labs Lab 01/11/17 1815 01/12/17 0327 01/13/17 0438  NA 139 138 138  K 4.2 3.6 3.8  CL 98* 100* 103  CO2 29 30 27   GLUCOSE 154* 120* 89  BUN 16 17 10   CREATININE 1.16* 1.21* 1.04*  CALCIUM 9.1 8.7* 9.0   GFR: Estimated Creatinine Clearance: 133.8 mL/min (by C-G formula based on SCr of 1.04 mg/dL (H)). Liver Function Tests:  Recent Labs Lab 01/11/17 1815  AST 18  ALT 14  ALKPHOS 59  BILITOT 1.6*  PROT 7.0  ALBUMIN 3.2*   No results for input(s): LIPASE, AMYLASE in the last 168 hours. No results for input(s): AMMONIA in the last 168 hours. Coagulation Profile:  Recent Labs Lab 01/11/17 1815  INR 1.13   Cardiac Enzymes: No results for input(s): CKTOTAL, CKMB, CKMBINDEX, TROPONINI in the last 168 hours. BNP (last 3 results)  Recent Labs  08/30/16 1413  PROBNP 641.0*   HbA1C: No results for input(s): HGBA1C in the last 72 hours. CBG:  Recent Labs Lab 01/12/17 0726 01/12/17 1124 01/12/17 1640 01/12/17 2048 01/13/17 0749  GLUCAP 122* 167* 93 137* 94   Lipid Profile: No results for input(s): CHOL, HDL, LDLCALC, TRIG, CHOLHDL, LDLDIRECT in the last 72 hours. Thyroid Function Tests: No results for input(s): TSH, T4TOTAL, FREET4, T3FREE, THYROIDAB in the last 72 hours. Anemia Panel: No results for input(s): VITAMINB12, FOLATE, FERRITIN, TIBC, IRON, RETICCTPCT in the last 72 hours. Sepsis Labs:  Recent Labs Lab 01/11/17 1829 01/11/17 2223  LATICACIDVEN 2.87* 1.28    Recent Results (from the past 240 hour(s))  Culture, blood (Routine x 2)     Status: None (Preliminary result)   Collection Time: 01/11/17  6:06 PM  Result Value Ref Range Status   Specimen Description BLOOD RIGHT ANTECUBITAL  Final   Special Requests BOTTLES DRAWN  AEROBIC ONLY 5CC  Final   Culture NO GROWTH < 24 HOURS  Final   Report Status PENDING  Incomplete  Culture, blood (Routine x 2)     Status: None (Preliminary result)   Collection Time: 01/11/17  6:11 PM  Result Value Ref Range Status   Specimen Description BLOOD LEFT ANTECUBITAL  Final   Special Requests BOTTLES DRAWN AEROBIC ONLY 5CC  Final   Culture NO GROWTH < 24 HOURS  Final   Report Status PENDING  Incomplete       Radiology Studies: No results found.    Scheduled Meds: . cephALEXin  500 mg Oral Q6H  . enoxaparin (LOVENOX) injection  100 mg Subcutaneous  Q24H  . insulin aspart  0-9 Units Subcutaneous TID WC  . levothyroxine  150 mcg Oral QAC breakfast  . losartan  25 mg Oral Daily  . metoprolol succinate  50 mg Oral Daily  . rosuvastatin  10 mg Oral Daily   Continuous Infusions:   LOS: 1 day    Time spent: 30 minutes   Dessa Phi, DO [facility] [email or web address] Password [facility] 01/13/2017, 9:24 AM

## 2017-01-13 NOTE — Progress Notes (Signed)
Patient has home CPAP and can place self on and off.  

## 2017-01-14 LAB — GLUCOSE, CAPILLARY
GLUCOSE-CAPILLARY: 173 mg/dL — AB (ref 65–99)
Glucose-Capillary: 97 mg/dL (ref 65–99)

## 2017-01-14 LAB — CBC WITH DIFFERENTIAL/PLATELET
BASOS PCT: 0 %
Basophils Absolute: 0 10*3/uL (ref 0.0–0.1)
EOS ABS: 0.2 10*3/uL (ref 0.0–0.7)
EOS PCT: 3 %
HCT: 39 % (ref 36.0–46.0)
Hemoglobin: 12 g/dL (ref 12.0–15.0)
LYMPHS ABS: 1.4 10*3/uL (ref 0.7–4.0)
Lymphocytes Relative: 20 %
MCH: 27.6 pg (ref 26.0–34.0)
MCHC: 30.8 g/dL (ref 30.0–36.0)
MCV: 89.9 fL (ref 78.0–100.0)
Monocytes Absolute: 0.7 10*3/uL (ref 0.1–1.0)
Monocytes Relative: 9 %
Neutro Abs: 4.8 10*3/uL (ref 1.7–7.7)
Neutrophils Relative %: 68 %
Platelets: 180 10*3/uL (ref 150–400)
RBC: 4.34 MIL/uL (ref 3.87–5.11)
RDW: 17.4 % — ABNORMAL HIGH (ref 11.5–15.5)
WBC: 7.1 10*3/uL (ref 4.0–10.5)

## 2017-01-14 MED ORDER — CEPHALEXIN 500 MG PO CAPS: 500.0000 mg | ORAL_CAPSULE | Freq: Four times a day (QID) | ORAL | 0 refills | Status: AC

## 2017-01-14 NOTE — Progress Notes (Signed)
Patient refused Lovenox. Educated and documented per [facility]. A.Naif Alabi, RN

## 2017-01-14 NOTE — Progress Notes (Signed)
Discharge instructions reviewed with patient.  Patient declined further questions at this time.  IV removed per policy.  A.Elzie Sheets, RN

## 2017-01-14 NOTE — Discharge Summary (Addendum)
Physician Discharge Summary  Alexandra Beck [id number] DOB: 07-Dec-1980 DOA: 01/11/2017  PCP: Alexandra Po, FNP  Admit date: 01/11/2017 Discharge date: 01/14/2017  Admitted From: Home Disposition:  Home  Recommendations for Outpatient Follow-up:  1. Follow up with PCP in 1 week 2. Please follow up on the following pending results: Final blood culture   Home Health: None  Equipment/Devices: None   Discharge Condition: Stable CODE STATUS: Full  Diet recommendation: Heart healthy  Brief/Interim Summary: Alexandra Beck a 36 y.o.femalewith history of extreme morbid obesity, OHS/OSA, physical inactivity, hypertension, hyperlipidemia, hypothyroidism, anemia, DM, hypothyroidism, GERD, recurrent cellulitis, venous insufficiency, chronic diastolic CHFpresents with complaints of fever chills with right lower extremity erythema. Patient has been experiencing this for last 24 hours and is admitted for treatment of cellulitis. She has had recurrent cellulitis of bilateral extremities. She was admitted for sepsis secondary to right leg cellulitis and was initially started on broad-spectrum antibiotics , vancomycin and Zosyn. Due to poor IV access and refusal for PICC, she was switched to oral antibiotic Keflex. On day of discharge, cellulitis appearance was mildly improved. She did have a low-grade fever 100.4 yesterday evening but has been afebrile since. Leukocytosis has now resolved. Blood cultures negative to date.  Discharge Diagnoses:  Principal Problem:   Cellulitis of right leg Active Problems:   Essential hypertension   OSA (obstructive sleep apnea)   Diabetes mellitus type 2 in obese (HCC)   Hypothyroidism   Chronic diastolic CHF (congestive heart failure) (HCC)  Sepsis secondary to right leg cellulitis -Recurrent cellulitis bilateral extremities -Blood culture negative to date, final result pending  -Patient's IV was not working and PICC was ordered. However, patient refused  PICC. Antibiotic was switched to Keflex. Cellulitis appearance has been improving. Patient is afebrile this morning. Leukocytosis, resolved.  Chronic diastolic CHF -Continue lasix, Zaroxolyn  DM type 2 -SSI   Hypothyroidism -Continue synthroid  HTN -Continue metoprolol, cozaar   HLD -Continue crestor   OSA -Continue CPAP qhs   Discharge Instructions  Discharge Instructions    Call MD for:    Complete by:  As directed    Worsening cellulitis, fever, chills, leg pain or swelling   Call MD for:  temperature >100.4    Complete by:  As directed    Diet - low sodium heart healthy    Complete by:  As directed    Increase activity slowly    Complete by:  As directed      Allergies as of 01/14/2017      Reactions   Aspirin    REACTION: throat swelling, hives   Doxycycline Other (See Comments)   Abdominal pain   Lisinopril Cough   Niaspan [niacin Er]    Caused flushing      Medication List    TAKE these medications   cephALEXin 500 MG capsule Commonly known as:  KEFLEX Take 1 capsule (500 mg total) by mouth every 6 (six) hours.   furosemide 40 MG tablet Commonly known as:  LASIX Take 60 mg by mouth 2 (two) times daily. What changed:  Another medication with the same name was removed. Continue taking this medication, and follow the directions you see here.   JARDIANCE 10 MG Tabs tablet Generic drug:  empagliflozin TAKE 1 TABLET BY MOUTH DAILY   levothyroxine 150 MCG tablet Commonly known as:  SYNTHROID, LEVOTHROID TAKE 1 TABLET BY MOUTH EVERY MORNING BEFORE BREAKFAST   losartan 25 MG tablet Commonly known as:  COZAAR Take 25 mg by  mouth daily.   metolazone 2.5 MG tablet Commonly known as:  ZAROXOLYN Take 1 tablet by mouth 1 time a week on Fridays before taking lasix What changed:  Another medication with the same name was removed. Continue taking this medication, and follow the directions you see here.   metoprolol succinate 50 MG 24 hr  tablet Commonly known as:  TOPROL-XL TAKE 1 TABLET BY MOUTH EVERY DAY WITH OR FOLLOWING A MEAL   potassium chloride SA 20 MEQ tablet Commonly known as:  K-DUR,KLOR-CON Take 2 tablets (40 mEq total) by mouth daily.   PREVIDENT 5000 BOOSTER PLUS 1.1 % Pste Generic drug:  Sodium Fluoride BRUSH ON TEETH BID FOR 2 MINS EACH TIME. EXPECTORATE AS MUCH AS POSSIBLE THEN DO NOT SWISH EAT OR DRINK FOR 30 MINS AFTER TREATMENT   rosuvastatin 10 MG tablet Commonly known as:  CRESTOR Take 1 tablet (10 mg total) by mouth daily.      Follow-up Information    Alexandra Po, FNP. Schedule an appointment as soon as possible for a visit in 1 week(s).   Specialty:  Family Medicine Contact information: [address] [place] [address] [phone]          Allergies  Allergen Reactions  . Aspirin     REACTION: throat swelling, hives  . Doxycycline Other (See Comments)    Abdominal pain  . Lisinopril Cough  . Niaspan [Niacin Er]     Caused flushing    Consultations:  None   Procedures/Studies:  No results found.    Discharge Exam: Vitals:   01/14/17 0928 01/14/17 1125  BP: 136/74   Pulse: 76   Resp: (!) 21   Temp: 98.4 F (36.9 C) 100.1 F (37.8 C)   Vitals:   01/13/17 2101 01/14/17 0645 01/14/17 0928 01/14/17 1125  BP: (!) 141/65 132/71 136/74   Pulse: 80 71 76   Resp: 20 20 (!) 21   Temp: (!) 100.4 F (38 C) 98.6 F (37 C) 98.4 F (36.9 C) 100.1 F (37.8 C)  TempSrc: Oral Oral Oral Oral  SpO2: 91% 94%    Weight:      Height:        General: Pt is alert, awake, not in acute distress Cardiovascular: RRR, S1/S2 +, no rubs, no gallops Respiratory: CTA bilaterally, no wheezing, no rhonchi Abdominal: Soft, NT, ND, bowel sounds +, obese Extremities: no edema, no cyanosis Skin: right lower extremity with erythema and warmth, the area of erythema has decreased mildly esp around posterior knee down the calf as well as around the ankles    The results of  significant diagnostics from this hospitalization (including imaging, microbiology, ancillary and laboratory) are listed below for reference.     Microbiology: Recent Results (from the past 240 hour(s))  Culture, blood (Routine x 2)     Status: None (Preliminary result)   Collection Time: 01/11/17  6:06 PM  Result Value Ref Range Status   Specimen Description BLOOD RIGHT ANTECUBITAL  Final   Special Requests BOTTLES DRAWN AEROBIC ONLY 5CC  Final   Culture NO GROWTH 3 DAYS  Final   Report Status PENDING  Incomplete  Culture, blood (Routine x 2)     Status: None (Preliminary result)   Collection Time: 01/11/17  6:11 PM  Result Value Ref Range Status   Specimen Description BLOOD LEFT ANTECUBITAL  Final   Special Requests BOTTLES DRAWN AEROBIC ONLY 5CC  Final   Culture NO GROWTH 3 DAYS  Final  Report Status PENDING  Incomplete     Labs: BNP (last 3 results) No results for input(s): BNP in the last 8760 hours. Basic Metabolic Panel:  Recent Labs Lab 01/11/17 1815 01/12/17 0327 01/13/17 0438  NA 139 138 138  K 4.2 3.6 3.8  CL 98* 100* 103  CO2 29 30 27   GLUCOSE 154* 120* 89  BUN 16 17 10   CREATININE 1.16* 1.21* 1.04*  CALCIUM 9.1 8.7* 9.0   Liver Function Tests:  Recent Labs Lab 01/11/17 1815  AST 18  ALT 14  ALKPHOS 59  BILITOT 1.6*  PROT 7.0  ALBUMIN 3.2*   No results for input(s): LIPASE, AMYLASE in the last 168 hours. No results for input(s): AMMONIA in the last 168 hours. CBC:  Recent Labs Lab 01/11/17 1815 01/12/17 0327 01/13/17 0438 01/14/17 0537  WBC 13.9* 11.6* 9.7 7.1  NEUTROABS 12.5*  --  7.5 4.8  HGB 13.5 12.4 11.6* 12.0  HCT 43.6 40.1 38.0 39.0  MCV 90.3 89.9 90.7 89.9  PLT 189 178 163 180   Cardiac Enzymes: No results for input(s): CKTOTAL, CKMB, CKMBINDEX, TROPONINI in the last 168 hours. BNP: Invalid input(s): POCBNP CBG:  Recent Labs Lab 01/13/17 0749 01/13/17 1149 01/13/17 1644 01/13/17 2033 01/14/17 0753  GLUCAP 94 108*  119* 129* 97   D-Dimer No results for input(s): DDIMER in the last 72 hours. Hgb A1c No results for input(s): HGBA1C in the last 72 hours. Lipid Profile No results for input(s): CHOL, HDL, LDLCALC, TRIG, CHOLHDL, LDLDIRECT in the last 72 hours. Thyroid function studies No results for input(s): TSH, T4TOTAL, T3FREE, THYROIDAB in the last 72 hours.  Invalid input(s): FREET3 Anemia work up No results for input(s): VITAMINB12, FOLATE, FERRITIN, TIBC, IRON, RETICCTPCT in the last 72 hours. Urinalysis    Component Value Date/Time   COLORURINE YELLOW 08/26/2016 [place] 08/26/2016 0449   LABSPEC 1.013 08/26/2016 0449   PHURINE 5.5 08/26/2016 0449   GLUCOSEU NEGATIVE 08/26/2016 0449   HGBUR TRACE (A) 08/26/2016 0449   BILIRUBINUR NEGATIVE 08/26/2016 0449   KETONESUR NEGATIVE 08/26/2016 0449   PROTEINUR 100 (A) 08/26/2016 0449   UROBILINOGEN 0.2 07/25/2014 1040   NITRITE NEGATIVE 08/26/2016 0449   LEUKOCYTESUR NEGATIVE 08/26/2016 0449   Sepsis Labs Invalid input(s): PROCALCITONIN,  WBC,  LACTICIDVEN Microbiology Recent Results (from the past 240 hour(s))  Culture, blood (Routine x 2)     Status: None (Preliminary result)   Collection Time: 01/11/17  6:06 PM  Result Value Ref Range Status   Specimen Description BLOOD RIGHT ANTECUBITAL  Final   Special Requests BOTTLES DRAWN AEROBIC ONLY 5CC  Final   Culture NO GROWTH 3 DAYS  Final   Report Status PENDING  Incomplete  Culture, blood (Routine x 2)     Status: None (Preliminary result)   Collection Time: 01/11/17  6:11 PM  Result Value Ref Range Status   Specimen Description BLOOD LEFT ANTECUBITAL  Final   Special Requests BOTTLES DRAWN AEROBIC ONLY 5CC  Final   Culture NO GROWTH 3 DAYS  Final   Report Status PENDING  Incomplete     Time coordinating discharge: 35 minutes  SIGNED:  Dessa Phi, DO [facility] Pager [phone]  If 7PM-7AM, please contact night-coverage [email or web address] Password  [facility] 01/14/2017, 11:28 AM

## 2017-01-14 NOTE — Progress Notes (Signed)
Patient discharged via wheelchair in NAD. A.Nyja Westbrook, RN

## 2017-01-16 ENCOUNTER — Telehealth (INDEPENDENT_AMBULATORY_CARE_PROVIDER_SITE_OTHER): Payer: Self-pay | Admitting: Orthopedic Surgery

## 2017-01-16 LAB — CULTURE, BLOOD (ROUTINE X 2)
CULTURE: NO GROWTH
Culture: NO GROWTH

## 2017-01-16 NOTE — Telephone Encounter (Signed)
Patient called wanting to know if she could get a unna boot. CB # [phone]

## 2017-01-16 NOTE — Telephone Encounter (Signed)
Advised patient that unna boot is applied by medical staff this is not something that can be given to her, or something that can be picked up. She is requesting appointment tomorrow morning to see MD, advised he was in surgery but she could come in that afternoon, she is not able to get transportation at that time. Requested a morning appointment. She asked if she could just see Autumn. Due to recent hospitalization for cellulitis, it would be better to see a provider. Made appointment for Erin on Wednesday morning.

## 2017-01-18 ENCOUNTER — Ambulatory Visit (INDEPENDENT_AMBULATORY_CARE_PROVIDER_SITE_OTHER): Payer: BC Managed Care – PPO | Admitting: Family

## 2017-01-18 DIAGNOSIS — L03115 Cellulitis of right lower limb: Secondary | ICD-10-CM | POA: Diagnosis not present

## 2017-01-18 NOTE — Progress Notes (Signed)
Office Visit Note   Patient: Alexandra Beck           Date of Birth: 16-Jun-1981           MRN: [id number] Visit Date: 01/18/2017              Requested by: [address], [place], [place] [address] PCP: Mauricio Po, FNP  Chief Complaint  Patient presents with  . Right Leg - Cellulitis    HPI: Alexandra Beck is a 36 y.o. female with history of extreme morbid obesity, OHS/OSA, physical inactivity, hypertension, hyperlipidemia, hypothyroidism, anemia, DM, hypothyroidism, GERD, recurrent cellulitis, venous insufficiency, chronic diastolic CHF presents today in follow up for right lower extremity cellulitis.   Patient has been recently hospitalized from 2/14-2/17 for treatment of cellulitis. She has history recurrent cellulitis of bilateral extremities. She was admitted for sepsis secondary to right leg cellulitis and was initially started on broad-spectrum antibiotics , vancomycin and Zosyn. Was discharged on oral antibiotic Keflex.   Cellulitis appearance mildly improved per patient. No recurrence of fevers or chills.   Has returned to work, complains of increased swelling to the right lower extremity with leg dependent for work. Has had good improvement of swelling and pain with compression wrapping in the past.     Assessment & Plan: Visit Diagnoses:  1. Cellulitis of right lower limb     Plan: Will apply unna and dynaflex wrapping today. Will follow up early next week for re-evaluation. Has appointment with PCP for follow up this Friday as well. Will complete the course of Keflex.  Follow-Up Instructions: No Follow-up on file.   Ortho Exam Physical Exam  Constitutional: Appears well-developed. Morbidly obese. Head: Normocephalic.  Eyes: EOM are normal.  Neck: Normal range of motion.  Cardiovascular: Normal rate.   Pulmonary/Chest: Effort normal.  Neurological: Is alert.  Skin: Skin is warm.  Psychiatric: Has a normal mood and affect. Right lower  extremity: 2+ pitting edema with erythema up to tibial tubercle. No weeping. No ulcerations. Is exquisitely tender posterior calf. No palpable abscess.   Imaging: No results found.  Orders:  No orders of the defined types were placed in this encounter.  No orders of the defined types were placed in this encounter.    Procedures: No procedures performed  Clinical Data: No additional findings.  Subjective: Review of Systems  Objective: Vital Signs: LMP 01/09/2017 (Within Days)   Specialty Comments:  No specialty comments available.  PMFS History: Patient Active Problem List   Diagnosis Date Noted  . Cellulitis of right leg 01/11/2017  . Abnormal EKG 12/16/2016  . PVC's (premature ventricular contractions) 12/02/2016  . Chronic diastolic CHF (congestive heart failure) ([place])   . Sepsis ([place]) 08/26/2016  . Acute kidney injury ([place])   . Leg edema, left 06/10/2015  . Sepsis, unspecified organism ([place]) 07/23/2014  . Hyperlipidemia   . Diabetes mellitus type 2 in obese ([place])   . Hypothyroidism   . GERD (gastroesophageal reflux disease)   . Depression   . Venous insufficiency of leg   . Recurrent cellulitis of lower leg 12/25/2012  . OSA (obstructive sleep apnea) 12/25/2012  . Obesity hypoventilation syndrome ([place]) 12/25/2012  . Morbid obesity with BMI of 70 and over, adult ([employer]) 12/24/2012  . Essential hypertension 06/26/2007   Past Medical History:  Diagnosis Date  . Acute renal failure (ARF) ([place]) 11/2012    multifactorial-likely secondary to ATN in the setting of sepsis and hypotension, and also  from rhabdomyolysis  . Anemia   . Chronic diastolic CHF (congestive heart failure) ([place])   . Depression   . Diabetes mellitus ([place])   . GERD (gastroesophageal reflux disease)   . Hyperlipidemia   . Hypertension   . Hypothyroidism   . Morbid obesity with BMI of 70 and over, adult ([place])   . Obesity hypoventilation syndrome ([place])   . OSA (obstructive sleep apnea)   .  PVC's (premature ventricular contractions)   . Recurrent cellulitis of lower leg    LLE, venous insuff  . Sepsis ([place]) 11/2012   Secondary to cellulitis  . Venous insufficiency of leg     Family History  Problem Relation Age of Onset  . Hypertension Mother   . Hypertension Father   . Heart disease Father     before age 25    Past Surgical History:  Procedure Laterality Date  . WISDOM TOOTH EXTRACTION     Social History   Occupational History  . 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 Smoking status: Former Smoker    Years: 0.00  . Smokeless tobacco: Never Used  . Alcohol use No  . Drug use: No  . Sexual activity: Not on file

## 2017-01-20 ENCOUNTER — Inpatient Hospital Stay: Payer: BC Managed Care – PPO | Admitting: Family

## 2017-01-20 ENCOUNTER — Ambulatory Visit (INDEPENDENT_AMBULATORY_CARE_PROVIDER_SITE_OTHER): Payer: BC Managed Care – PPO | Admitting: Family

## 2017-01-20 ENCOUNTER — Encounter: Payer: Self-pay | Admitting: Family

## 2017-01-20 VITALS — BP 122/80 | HR 71 | Temp 98.5°F | Resp 18 | Ht 62.0 in | Wt >= 6400 oz

## 2017-01-20 DIAGNOSIS — Z6841 Body Mass Index (BMI) 40.0 and over, adult: Secondary | ICD-10-CM

## 2017-01-20 DIAGNOSIS — L03119 Cellulitis of unspecified part of limb: Secondary | ICD-10-CM | POA: Diagnosis not present

## 2017-01-20 DIAGNOSIS — I5032 Chronic diastolic (congestive) heart failure: Secondary | ICD-10-CM | POA: Diagnosis not present

## 2017-01-20 NOTE — Assessment & Plan Note (Signed)
BMI 83.22. Discussed importance of weight loss to help reduce continued recurrent cellulitis of the lower extremities. Recommended weight loss of 5-10% of current body weight through nutrition and physical activity with consideration for programs such as Rickard Patience, Weight Watchers, or Nutrisystem. She has recently joined a gym and has yet to get restarted secondary to her lower extremity cellulitis. Recommend establishing goals to help achieve success. If conservative treatments are not successful discussed possibilities of bariatric surgery to help with weight loss.

## 2017-01-20 NOTE — Patient Instructions (Signed)
Thank you for choosing [facility].  SUMMARY AND INSTRUCTIONS:  Let's work on establishing goals.  Keep taking your medications as prescribed.   Medication:  Your prescription(s) have been submitted to your pharmacy or been printed and provided for you. Please take as directed and contact our office if you believe you are having problem(s) with the medication(s) or have any questions.  Follow up:  If your symptoms worsen or fail to improve, please contact our office for further instruction, or in case of emergency go directly to the emergency room at the closest medical facility.

## 2017-01-20 NOTE — Assessment & Plan Note (Signed)
Appears euvolemic with no chest pain, shortness of breath, and mild lower extremity edema. Continue current dosage of furosemide and metolazone. Managing fluid through weight is complicated by her current weight. Continue to monitor with follow up per cardiology.

## 2017-01-20 NOTE — Progress Notes (Signed)
Subjective:    Patient ID: Alexandra Beck, female    DOB: 1981/11/13, 36 y.o.   MRN: [id number]  Chief Complaint  Patient presents with  . Hospitalization Follow-up    HPI:  LASHANTE Beck is a 36 y.o. female who  has a past medical history of Acute renal failure (ARF) ([place]) (11/2012); Anemia; Chronic diastolic CHF (congestive heart failure) ([place]); Depression; Diabetes mellitus ([place]); GERD (gastroesophageal reflux disease); Hyperlipidemia; Hypertension; Hypothyroidism; Morbid obesity with BMI of 70 and over, adult ([place]); Obesity hypoventilation syndrome ([place]); OSA (obstructive sleep apnea); PVC's (premature ventricular contractions); Recurrent cellulitis of lower leg; Sepsis ([place]) (11/2012); and Venous insufficiency of leg. and presents today   Recently evaluated in the emergency department and admitted to the hospital with right leg swelling, redness, fevers and chills. Physical exam with bilateral edema and erythema of the right leg. One blood cell count was 13.9; serum creatinine was 1.16 and lactic acid was 2.87. Temperature was noted to be 100.3 on arrival to the emergency department. She was diagnosed with sepsis secondary to cellulitis. Fluid resuscitation was held secondary to concern for recently diagnosed congestive heart failure. She was given 2 L normal saline and vancomycin/Zosyn. Repeat lactic acid within normal limits. Noted to have recurrent cellulitis of bilateral lower extremities with blood cultures negative. Was recommended PICC line secondary to IV issues however PICC line was refused. Antibiotic was switched to Keflex with cellulitis and leukocytosis improving. All hospital records, labs, and imaging reviewed in detail.  Since leaving the hospital she has followed up with orthopedics with the application of an Unna boot that will remain in place until the middle of next week. Reports taking the Keflex as prescribed and denies adverse side effects. Denies any fevers, chills or  worsening of symptoms since application of the Unna boot. Course of the symptoms continues to improve. She has returned to baseline function. She has questions regarding her diabetes diagnosis and fluid management.    Allergies  Allergen Reactions  . Aspirin     REACTION: throat swelling, hives  . Doxycycline Other (See Comments)    Abdominal pain  . Lisinopril Cough  . Niaspan [Niacin Er]     Caused flushing      Outpatient Medications Prior to Visit  Medication Sig Dispense Refill  . cephALEXin (KEFLEX) 500 MG capsule Take 1 capsule (500 mg total) by mouth every 6 (six) hours. 28 capsule 0  . furosemide (LASIX) 40 MG tablet Take 60 mg by mouth 2 (two) times daily.    Marland Kitchen JARDIANCE 10 MG TABS tablet TAKE 1 TABLET BY MOUTH DAILY 30 tablet 0  . levothyroxine (SYNTHROID, LEVOTHROID) 150 MCG tablet TAKE 1 TABLET BY MOUTH EVERY MORNING BEFORE BREAKFAST 30 tablet 0  . losartan (COZAAR) 25 MG tablet Take 25 mg by mouth daily.    . metolazone (ZAROXOLYN) 2.5 MG tablet Take 1 tablet by mouth 1 time a week on Fridays before taking lasix 10 tablet 0  . metoprolol succinate (TOPROL-XL) 50 MG 24 hr tablet TAKE 1 TABLET BY MOUTH EVERY DAY WITH OR FOLLOWING A MEAL 30 tablet 0  . potassium chloride SA (K-DUR,KLOR-CON) 20 MEQ tablet Take 2 tablets (40 mEq total) by mouth daily. 60 tablet 11  . PREVIDENT 5000 BOOSTER PLUS 1.1 % PSTE BRUSH ON TEETH BID FOR 2 MINS EACH TIME. EXPECTORATE AS MUCH AS POSSIBLE THEN DO NOT SWISH EAT OR DRINK FOR 30 MINS AFTER TREATMENT  0  . rosuvastatin (CRESTOR) 10 MG tablet Take  1 tablet (10 mg total) by mouth daily. 90 tablet 1   No facility-administered medications prior to visit.       Past Surgical History:  Procedure Laterality Date  . WISDOM TOOTH EXTRACTION        Past Medical History:  Diagnosis Date  . Acute renal failure (ARF) ([place]) 11/2012    multifactorial-likely secondary to ATN in the setting of sepsis and hypotension, and also from rhabdomyolysis    . Anemia   . Chronic diastolic CHF (congestive heart failure) ([place])   . Depression   . Diabetes mellitus (Alexandra Beck)   . GERD (gastroesophageal reflux disease)   . Hyperlipidemia   . Hypertension   . Hypothyroidism   . Morbid obesity with BMI of 70 and over, adult ([place])   . Obesity hypoventilation syndrome ([place])   . OSA (obstructive sleep apnea)   . PVC's (premature ventricular contractions)   . Recurrent cellulitis of lower leg    LLE, venous insuff  . Sepsis ([place]) 11/2012   Secondary to cellulitis  . Venous insufficiency of leg       Review of Systems  Constitutional: Negative for chills and fever.  Eyes:       Denies changes in vision  Respiratory: Negative for cough, chest tightness, shortness of breath and wheezing.   Cardiovascular: Positive for leg swelling. Negative for chest pain and palpitations.  Endocrine: Negative for polydipsia, polyphagia and polyuria.  Neurological: Negative for dizziness, weakness, light-headedness and numbness.      Objective:    BP 122/80 (BP Location: Left Arm, Patient Position: Sitting, Cuff Size: Large)   Pulse 71   Temp 98.5 F (36.9 C) (Oral)   Resp 18   Ht 5\' 2"  (1.575 m)   Wt (!) 455 lb (206.4 kg)   LMP 01/09/2017 (Within Days)   SpO2 95%   BMI 83.22 kg/m  Nursing note and vital signs reviewed.  Physical Exam  Constitutional: She is oriented to person, place, and time. She appears well-developed and well-nourished. No distress.  Cardiovascular: Normal rate, regular rhythm, normal heart sounds and intact distal pulses.   Mild lower extremity edema of the bilateral lower extremities and Unna boot in place.   Pulmonary/Chest: Effort normal and breath sounds normal.  Neurological: She is alert and oriented to person, place, and time.  Skin: Skin is warm and dry.  Psychiatric: She has a normal mood and affect. Her behavior is normal. Judgment and thought content normal.       Assessment & Plan:   Problem List Items Addressed  This Visit      Cardiovascular and Mediastinum   Chronic diastolic CHF (congestive heart failure) ([place])    Appears euvolemic with no chest pain, shortness of breath, and mild lower extremity edema. Continue current dosage of furosemide and metolazone. Managing fluid through weight is complicated by her current weight. Continue to monitor with follow up per cardiology.         Other   Morbid obesity with BMI of 70 and over, adult ([place])    BMI 83.22. Discussed importance of weight loss to help reduce continued recurrent cellulitis of the lower extremities. Recommended weight loss of 5-10% of current body weight through nutrition and physical activity with consideration for programs such as Rickard Patience, Weight Watchers, or Nutrisystem. She has recently joined a gym and has yet to get restarted secondary to her lower extremity cellulitis. Recommend establishing goals to help achieve success. If conservative treatments are not successful discussed possibilities  of bariatric surgery to help with weight loss.      Recurrent cellulitis of lower leg - Primary    Symptoms of recurrent cellulitis and sepsis appeared to be well controlled. Continue Keflex until completed. Continue with Unna boot per orthopedics. Continue furosemide at 60 mg twice daily with additional metolazone. Continue to monitor and follow-up if symptoms worsen or do not continue to improve.           I am having Ms. Helmke maintain her PREVIDENT 5000 BOOSTER PLUS, rosuvastatin, losartan, metolazone, JARDIANCE, levothyroxine, metoprolol succinate, potassium chloride SA, furosemide, and cephALEXin.   A total of 25 minutes were spent face-to-face with the patient during this encounter and over half of that time was spent on counseling and coordination of care.  We discussed in depth the diagnosis of diabetes and the management and plans for her chronic conditions including methods of weight loss. All patient questions were answered.    Follow-up: Return in about 1 month (around 02/17/2017), or if symptoms worsen or fail to improve.  Mauricio Po, FNP

## 2017-01-20 NOTE — Assessment & Plan Note (Signed)
Symptoms of recurrent cellulitis and sepsis appeared to be well controlled. Continue Keflex until completed. Continue with Unna boot per orthopedics. Continue furosemide at 60 mg twice daily with additional metolazone. Continue to monitor and follow-up if symptoms worsen or do not continue to improve.

## 2017-01-25 ENCOUNTER — Telehealth (INDEPENDENT_AMBULATORY_CARE_PROVIDER_SITE_OTHER): Payer: Self-pay | Admitting: *Deleted

## 2017-01-25 ENCOUNTER — Ambulatory Visit (INDEPENDENT_AMBULATORY_CARE_PROVIDER_SITE_OTHER): Payer: BC Managed Care – PPO | Admitting: Family

## 2017-01-25 NOTE — Telephone Encounter (Signed)
Pt called to cancel her appt for this morning. Pt stated this was to remove unna boot and pt asked if this is something she can just do at home

## 2017-01-25 NOTE — Telephone Encounter (Signed)
I called and spoke with patient to advise she can convert to compression stocking per Junie Panning. Patient expressed understanding and she will call us if she needs [place].

## 2017-02-07 ENCOUNTER — Other Ambulatory Visit: Payer: Self-pay | Admitting: Family

## 2017-02-07 ENCOUNTER — Other Ambulatory Visit: Payer: Self-pay | Admitting: Physician Assistant

## 2017-02-07 DIAGNOSIS — E039 Hypothyroidism, unspecified: Secondary | ICD-10-CM

## 2017-02-07 DIAGNOSIS — I509 Heart failure, unspecified: Secondary | ICD-10-CM

## 2017-03-25 ENCOUNTER — Encounter (HOSPITAL_COMMUNITY): Payer: Self-pay | Admitting: *Deleted

## 2017-03-25 ENCOUNTER — Inpatient Hospital Stay (HOSPITAL_COMMUNITY)
Admission: EM | Admit: 2017-03-25 | Discharge: 2017-03-30 | DRG: 872 | Disposition: A | Discharge status: Home / Self Care | Payer: BC Managed Care – PPO | Attending: Internal Medicine | Admitting: Internal Medicine

## 2017-03-25 DIAGNOSIS — E1169 Type 2 diabetes mellitus with other specified complication: Secondary | ICD-10-CM | POA: Diagnosis present

## 2017-03-25 DIAGNOSIS — A419 Sepsis, unspecified organism: Secondary | ICD-10-CM | POA: Diagnosis not present

## 2017-03-25 DIAGNOSIS — L03115 Cellulitis of right lower limb: Secondary | ICD-10-CM | POA: Diagnosis not present

## 2017-03-25 DIAGNOSIS — L0390 Cellulitis, unspecified: Secondary | ICD-10-CM

## 2017-03-25 DIAGNOSIS — Z87891 Personal history of nicotine dependence: Secondary | ICD-10-CM

## 2017-03-25 DIAGNOSIS — E872 Acidosis: Secondary | ICD-10-CM | POA: Diagnosis present

## 2017-03-25 DIAGNOSIS — D649 Anemia, unspecified: Secondary | ICD-10-CM | POA: Diagnosis present

## 2017-03-25 DIAGNOSIS — I152 Hypertension secondary to endocrine disorders: Secondary | ICD-10-CM | POA: Diagnosis present

## 2017-03-25 DIAGNOSIS — I5032 Chronic diastolic (congestive) heart failure: Secondary | ICD-10-CM | POA: Diagnosis present

## 2017-03-25 DIAGNOSIS — Z8249 Family history of ischemic heart disease and other diseases of the circulatory system: Secondary | ICD-10-CM

## 2017-03-25 DIAGNOSIS — I959 Hypotension, unspecified: Secondary | ICD-10-CM | POA: Diagnosis present

## 2017-03-25 DIAGNOSIS — G4733 Obstructive sleep apnea (adult) (pediatric): Secondary | ICD-10-CM | POA: Diagnosis present

## 2017-03-25 DIAGNOSIS — Z7984 Long term (current) use of oral hypoglycemic drugs: Secondary | ICD-10-CM

## 2017-03-25 DIAGNOSIS — E876 Hypokalemia: Secondary | ICD-10-CM | POA: Diagnosis not present

## 2017-03-25 DIAGNOSIS — Z8614 Personal history of Methicillin resistant Staphylococcus aureus infection: Secondary | ICD-10-CM

## 2017-03-25 DIAGNOSIS — E039 Hypothyroidism, unspecified: Secondary | ICD-10-CM | POA: Diagnosis not present

## 2017-03-25 DIAGNOSIS — E1159 Type 2 diabetes mellitus with other circulatory complications: Secondary | ICD-10-CM | POA: Diagnosis present

## 2017-03-25 DIAGNOSIS — Z6841 Body Mass Index (BMI) 40.0 and over, adult: Secondary | ICD-10-CM

## 2017-03-25 DIAGNOSIS — E785 Hyperlipidemia, unspecified: Secondary | ICD-10-CM | POA: Diagnosis present

## 2017-03-25 DIAGNOSIS — I10 Essential (primary) hypertension: Secondary | ICD-10-CM | POA: Diagnosis not present

## 2017-03-25 DIAGNOSIS — I110 Hypertensive heart disease with heart failure: Secondary | ICD-10-CM | POA: Diagnosis present

## 2017-03-25 DIAGNOSIS — E1122 Type 2 diabetes mellitus with diabetic chronic kidney disease: Secondary | ICD-10-CM | POA: Diagnosis present

## 2017-03-25 DIAGNOSIS — E119 Type 2 diabetes mellitus without complications: Secondary | ICD-10-CM | POA: Diagnosis present

## 2017-03-25 DIAGNOSIS — E669 Obesity, unspecified: Secondary | ICD-10-CM

## 2017-03-25 LAB — CBC WITH DIFFERENTIAL/PLATELET
Basophils Absolute: 0 10*3/uL (ref 0.0–0.1)
Basophils Relative: 0 %
EOS PCT: 0 %
Eosinophils Absolute: 0 10*3/uL (ref 0.0–0.7)
HCT: 45.4 % (ref 36.0–46.0)
Hemoglobin: 14.2 g/dL (ref 12.0–15.0)
LYMPHS ABS: 0.8 10*3/uL (ref 0.7–4.0)
LYMPHS PCT: 4 %
MCH: 27.4 pg (ref 26.0–34.0)
MCHC: 31.3 g/dL (ref 30.0–36.0)
MCV: 87.5 fL (ref 78.0–100.0)
MONO ABS: 0.9 10*3/uL (ref 0.1–1.0)
Monocytes Relative: 4 %
Neutro Abs: 19.9 10*3/uL — ABNORMAL HIGH (ref 1.7–7.7)
Neutrophils Relative %: 92 %
PLATELETS: 229 10*3/uL (ref 150–400)
RBC: 5.19 MIL/uL — AB (ref 3.87–5.11)
RDW: 17.3 % — ABNORMAL HIGH (ref 11.5–15.5)
WBC: 21.7 10*3/uL — AB (ref 4.0–10.5)

## 2017-03-25 LAB — COMPREHENSIVE METABOLIC PANEL
ALBUMIN: 3.7 g/dL (ref 3.5–5.0)
ALK PHOS: 73 U/L (ref 38–126)
ALT: 15 U/L (ref 14–54)
AST: 27 U/L (ref 15–41)
Anion gap: 13 (ref 5–15)
BUN: 13 mg/dL (ref 6–20)
CALCIUM: 9.6 mg/dL (ref 8.9–10.3)
CO2: 28 mmol/L (ref 22–32)
CREATININE: 1.22 mg/dL — AB (ref 0.44–1.00)
Chloride: 94 mmol/L — ABNORMAL LOW (ref 101–111)
GFR calc Af Amer: >60 mL/min (ref >60)
GFR calc non Af Amer: 56 mL/min — ABNORMAL LOW (ref >60)
GLUCOSE: 170 mg/dL — AB (ref 65–99)
Potassium: 3.6 mmol/L (ref 3.5–5.1)
SODIUM: 135 mmol/L (ref 135–145)
Total Bilirubin: 1.6 mg/dL — ABNORMAL HIGH (ref 0.3–1.2)
Total Protein: 8 g/dL (ref 6.5–8.1)

## 2017-03-25 LAB — CREATININE, SERUM
Creatinine, Ser: 1.07 mg/dL — ABNORMAL HIGH (ref 0.44–1.00)
GFR calc Af Amer: >60 mL/min (ref >60)
GFR calc non Af Amer: >60 mL/min (ref >60)

## 2017-03-25 LAB — CBC
HEMATOCRIT: 41.2 % (ref 36.0–46.0)
Hemoglobin: 12.8 g/dL (ref 12.0–15.0)
MCH: 27 pg (ref 26.0–34.0)
MCHC: 31.1 g/dL (ref 30.0–36.0)
MCV: 86.9 fL (ref 78.0–100.0)
PLATELETS: 171 10*3/uL (ref 150–400)
RBC: 4.74 MIL/uL (ref 3.87–5.11)
RDW: 17.5 % — AB (ref 11.5–15.5)
WBC: 13.6 10*3/uL — AB (ref 4.0–10.5)

## 2017-03-25 LAB — I-STAT CG4 LACTIC ACID, ED
Lactic Acid, Venous: 4.65 mmol/L (ref 0.5–1.9)
Lactic Acid, Venous: 1.42 mmol/L (ref 0.5–1.9)

## 2017-03-25 LAB — GLUCOSE, CAPILLARY: Glucose-Capillary: 139 mg/dL — ABNORMAL HIGH (ref 65–99)

## 2017-03-25 LAB — PROTIME-INR
INR: 1.03
Prothrombin Time: 13.5 seconds (ref 11.4–15.2)

## 2017-03-25 MED ORDER — VANCOMYCIN HCL 10 G IV SOLR
1500.0000 mg | Freq: Three times a day (TID) | INTRAVENOUS | Status: DC
  Administered 2017-03-26 – 2017-03-29 (×6): 1500 mg via INTRAVENOUS
  Filled 2017-03-25 (×13): qty 1500

## 2017-03-25 MED ORDER — ROSUVASTATIN CALCIUM 10 MG PO TABS
10.0000 mg | ORAL_TABLET | Freq: Every day | ORAL | Status: DC
  Administered 2017-03-25 – 2017-03-29 (×5): 10 mg via ORAL
  Filled 2017-03-25 (×6): qty 1

## 2017-03-25 MED ORDER — HYDRALAZINE HCL 20 MG/ML IJ SOLN
10.0000 mg | Freq: Three times a day (TID) | INTRAMUSCULAR | Status: DC | PRN
  Filled 2017-03-25: qty 1

## 2017-03-25 MED ORDER — ACETAMINOPHEN 325 MG PO TABS
650.0000 mg | ORAL_TABLET | Freq: Once | ORAL | Status: AC
  Administered 2017-03-25: 650 mg via ORAL

## 2017-03-25 MED ORDER — PIPERACILLIN-TAZOBACTAM 3.375 G IVPB
3.3750 g | Freq: Three times a day (TID) | INTRAVENOUS | Status: DC
  Administered 2017-03-25 – 2017-03-29 (×6): 3.375 g via INTRAVENOUS
  Filled 2017-03-25 (×13): qty 50

## 2017-03-25 MED ORDER — ENOXAPARIN SODIUM 40 MG/0.4ML ~~LOC~~ SOLN
40.0000 mg | SUBCUTANEOUS | Status: DC
  Filled 2017-03-25: qty 0.4

## 2017-03-25 MED ORDER — ACETAMINOPHEN 325 MG PO TABS
650.0000 mg | ORAL_TABLET | Freq: Four times a day (QID) | ORAL | Status: DC | PRN
  Administered 2017-03-25 – 2017-03-29 (×4): 650 mg via ORAL
  Filled 2017-03-25 (×4): qty 2

## 2017-03-25 MED ORDER — PIPERACILLIN-TAZOBACTAM 3.375 G IVPB 30 MIN
3.3750 g | Freq: Once | INTRAVENOUS | Status: AC
  Administered 2017-03-25: 3.375 g via INTRAVENOUS
  Filled 2017-03-25: qty 50

## 2017-03-25 MED ORDER — ACETAMINOPHEN 650 MG RE SUPP: 650.0000 mg | Freq: Four times a day (QID) | RECTAL | Status: DC | PRN

## 2017-03-25 MED ORDER — HYDROMORPHONE HCL 1 MG/ML IJ SOLN
1.0000 mg | Freq: Two times a day (BID) | INTRAMUSCULAR | Status: DC | PRN
Start: 2017-03-25 — End: 2017-03-29
  Administered 2017-03-25 – 2017-03-29 (×4): 1 mg via INTRAVENOUS
  Filled 2017-03-25 (×4): qty 1

## 2017-03-25 MED ORDER — LEVOTHYROXINE SODIUM 75 MCG PO TABS
150.0000 ug | ORAL_TABLET | Freq: Every day | ORAL | Status: DC
  Administered 2017-03-25 – 2017-03-30 (×6): 150 ug via ORAL
  Filled 2017-03-25 (×3): qty 2
  Filled 2017-03-25: qty 1
  Filled 2017-03-25 (×3): qty 2

## 2017-03-25 MED ORDER — VANCOMYCIN HCL 10 G IV SOLR
2500.0000 mg | Freq: Once | INTRAVENOUS | Status: AC
  Administered 2017-03-25: 2500 mg via INTRAVENOUS
  Filled 2017-03-25: qty 2500

## 2017-03-25 MED ORDER — SODIUM CHLORIDE 0.9 % IV BOLUS (SEPSIS)
500.0000 mL | Freq: Once | INTRAVENOUS | Status: AC
  Administered 2017-03-25: 500 mL via INTRAVENOUS

## 2017-03-25 MED ORDER — SODIUM CHLORIDE 0.9 % IV SOLN: INTRAVENOUS | Status: DC

## 2017-03-25 MED ORDER — ONDANSETRON HCL 4 MG/2ML IJ SOLN: 4.0000 mg | Freq: Four times a day (QID) | INTRAMUSCULAR | Status: DC | PRN

## 2017-03-25 MED ORDER — LOSARTAN POTASSIUM 25 MG PO TABS
25.0000 mg | ORAL_TABLET | Freq: Every day | ORAL | Status: DC
  Administered 2017-03-25 – 2017-03-30 (×6): 25 mg via ORAL
  Filled 2017-03-25 (×6): qty 1

## 2017-03-25 MED ORDER — ACETAMINOPHEN 325 MG PO TABS
ORAL_TABLET | ORAL | Status: AC
  Filled 2017-03-25: qty 2

## 2017-03-25 MED ORDER — INSULIN ASPART 100 UNIT/ML ~~LOC~~ SOLN
0.0000 [IU] | Freq: Three times a day (TID) | SUBCUTANEOUS | Status: DC
  Administered 2017-03-26: 3 [IU] via SUBCUTANEOUS
  Administered 2017-03-28: 4 [IU] via SUBCUTANEOUS
  Administered 2017-03-29: 1 [IU] via SUBCUTANEOUS
  Administered 2017-03-29 (×2): 3 [IU] via SUBCUTANEOUS
  Administered 2017-03-30: 4 [IU] via SUBCUTANEOUS
  Administered 2017-03-30: 3 [IU] via SUBCUTANEOUS

## 2017-03-25 MED ORDER — SODIUM CHLORIDE 0.9 % IV BOLUS (SEPSIS)
2000.0000 mL | Freq: Once | INTRAVENOUS | Status: AC
  Administered 2017-03-25: 2000 mL via INTRAVENOUS

## 2017-03-25 MED ORDER — SODIUM CHLORIDE 0.9% FLUSH
3.0000 mL | Freq: Two times a day (BID) | INTRAVENOUS | Status: DC
  Administered 2017-03-25 – 2017-03-29 (×6): 3 mL via INTRAVENOUS

## 2017-03-25 MED ORDER — ONDANSETRON HCL 4 MG PO TABS: 4.0000 mg | ORAL_TABLET | Freq: Four times a day (QID) | ORAL | Status: DC | PRN

## 2017-03-25 NOTE — ED Provider Notes (Signed)
[facility] DEPT Provider Note   CSN: [id number] Arrival date & time: 03/25/17  1354     History   Chief Complaint No chief complaint on file.   HPI Alexandra Beck is a 36 y.o. female. Chief complaint is fever, rapid heart rate, red painful leg, and dizziness.  HPI 36 year old female. History of diabetes. Non-insulin-dependent. Morbid obesity. Multiple prior episodes of lower show any cellulitis. Noticed right leg redness last night. More painful and red and swollen today. Weakness dizziness lightheaded at home. Arrives with red swollen leg, temperature over 102, and tachycardia.  Past Medical History:  Diagnosis Date  . Acute renal failure (ARF) ([place]) 11/2012    multifactorial-likely secondary to ATN in the setting of sepsis and hypotension, and also from rhabdomyolysis  . Anemia   . Chronic diastolic CHF (congestive heart failure) ([place])   . Depression   . Diabetes mellitus ([place])   . GERD (gastroesophageal reflux disease)   . Hyperlipidemia   . Hypertension   . Hypothyroidism   . Morbid obesity with BMI of 70 and over, adult ([place])   . Obesity hypoventilation syndrome ([place])   . OSA (obstructive sleep apnea)   . PVC's (premature ventricular contractions)   . Recurrent cellulitis of lower leg    LLE, venous insuff  . Sepsis ([place]) 11/2012   Secondary to cellulitis  . Venous insufficiency of leg     Patient Active Problem List   Diagnosis Date Noted  . Cellulitis of right leg 01/11/2017  . Abnormal EKG 12/16/2016  . PVC's (premature ventricular contractions) 12/02/2016  . Chronic diastolic CHF (congestive heart failure) ([place])   . Sepsis ([place]) 08/26/2016  . Acute kidney injury ([place])   . Leg edema, left 06/10/2015  . Sepsis, unspecified organism ([place]) 07/23/2014  . Hyperlipidemia   . Diabetes mellitus type 2 in obese ([place])   . Hypothyroidism   . GERD (gastroesophageal reflux disease)   . Depression   . Venous insufficiency of leg   . Recurrent cellulitis of lower  leg 12/25/2012  . OSA (obstructive sleep apnea) 12/25/2012  . Obesity hypoventilation syndrome ([place]) 12/25/2012  . Morbid obesity with BMI of 70 and over, adult ([place]) 12/24/2012  . Essential hypertension 06/26/2007    Past Surgical History:  Procedure Laterality Date  . WISDOM TOOTH EXTRACTION      OB History    No data available       Home Medications    Prior to Admission medications   Medication Sig Start Date End Date Taking? Authorizing Provider  furosemide (LASIX) 40 MG tablet Take 60 mg by mouth 2 (two) times daily.   Yes Historical Provider, MD  JARDIANCE 10 MG TABS tablet TAKE 1 TABLET BY MOUTH DAILY 02/08/17  Yes Golden Circle, FNP  levothyroxine (SYNTHROID, LEVOTHROID) 150 MCG tablet TAKE 1 TABLET BY MOUTH EVERY MORNING BEFORE BREAKFAST 02/08/17  Yes Golden Circle, FNP  losartan (COZAAR) 25 MG tablet Take 25 mg by mouth daily.   Yes Historical Provider, MD  metolazone (ZAROXOLYN) 2.5 MG tablet Take 1 tablet by mouth 1 time a week on Fridays before taking lasix 12/20/16  Yes Dayna N Dunn, PA-C  metoprolol succinate (TOPROL-XL) 50 MG 24 hr tablet TAKE 1 TABLET BY MOUTH EVERY DAY WITH OR FOLLOWING A MEAL 02/08/17  Yes Golden Circle, FNP  potassium chloride SA (K-DUR,KLOR-CON) 20 MEQ tablet Take 2 tablets (40 mEq total) by mouth daily. 01/06/17  Yes Dayna N Dunn, PA-C  PREVIDENT 5000 BOOSTER PLUS  1.1 % PSTE BRUSH ON TEETH BID FOR 2 MINS EACH TIME. EXPECTORATE AS MUCH AS POSSIBLE THEN DO NOT SWISH EAT OR DRINK FOR 30 MINS AFTER TREATMENT 06/24/16  Yes Historical Provider, MD  rosuvastatin (CRESTOR) 10 MG tablet Take 1 tablet (10 mg total) by mouth daily. 10/25/16  Yes Golden Circle, FNP  metolazone (ZAROXOLYN) 2.5 MG tablet Take 1 tablet by mouth 1 time a week on Fridays before taking lasix Patient not taking: Reported on 03/25/2017 02/09/17   Charlie Pitter, PA-C    Family History Family History  Problem Relation Age of Onset  . Hypertension Mother   . Hypertension  Father   . Heart disease Father     before age 40    Social History Social History  Substance Use Topics  . Smoking status: Former Smoker    Years: 0.00  . Smokeless tobacco: Never Used  . Alcohol use No     Allergies   Aspirin; Doxycycline; Lisinopril; and Niaspan [niacin er]   Review of Systems Review of Systems  Constitutional: Positive for chills, diaphoresis, fatigue and fever. Negative for appetite change.  HENT: Negative for mouth sores, sore throat and trouble swallowing.   Eyes: Negative for visual disturbance.  Respiratory: Negative for cough, chest tightness, shortness of breath and wheezing.   Cardiovascular: Negative for chest pain.       Palpitations  Gastrointestinal: Negative for abdominal distention, abdominal pain, diarrhea, nausea and vomiting.  Endocrine: Negative for polydipsia, polyphagia and polyuria.  Genitourinary: Negative for dysuria, frequency and hematuria.  Musculoskeletal: Negative for gait problem.  Skin: Negative for color change, pallor and rash.       Red painful swollen right lower leg.  Neurological: Negative for dizziness, syncope, light-headedness and headaches.  Hematological: Does not bruise/bleed easily.  Psychiatric/Behavioral: Negative for behavioral problems and confusion.     Physical Exam Updated Vital Signs BP (!) 110/51   Pulse 93   Temp (!) 101.1 F (38.4 C) (Oral)   Resp 20   Ht 4\' 11"  (1.499 m)   Wt (!) 459 lb 5 oz (208.3 kg)   LMP 03/20/2017   SpO2 91%   BMI 92.77 kg/m   Physical Exam  Constitutional: She is oriented to person, place, and time. She appears well-developed and well-nourished. No distress.  Morbid obesity. Awake alert. Diaphoretic. Lucid.  HENT:  Head: Normocephalic.  Eyes: Conjunctivae are normal. Pupils are equal, round, and reactive to light. No scleral icterus.  Neck: Normal range of motion. Neck supple. No thyromegaly present.  Cardiovascular: Regular rhythm.  Tachycardia present.  Exam  reveals no gallop and no friction rub.   No murmur heard. Sinus tachycardia  Pulmonary/Chest: Effort normal and breath sounds normal. No respiratory distress. She has no wheezes. She has no rales.  Abdominal: Soft. Bowel sounds are normal. She exhibits no distension. There is no tenderness. There is no rebound.  Musculoskeletal: Normal range of motion.  Neurological: She is alert and oriented to person, place, and time.  Skin: Skin is warm and dry. No rash noted.     Psychiatric: She has a normal mood and affect. Her behavior is normal.     ED Treatments / Results  Labs (all labs ordered are listed, but only abnormal results are displayed) Labs Reviewed  COMPREHENSIVE METABOLIC PANEL - Abnormal; Notable for the following:       Result Value   Chloride 94 (*)    Glucose, Bld 170 (*)    Creatinine, Ser 1.22 (*)  Total Bilirubin 1.6 (*)    GFR calc non Af Amer 56 (*)    All other components within normal limits  CBC WITH DIFFERENTIAL/PLATELET - Abnormal; Notable for the following:    WBC 21.7 (*)    RBC 5.19 (*)    RDW 17.3 (*)    Neutro Abs 19.9 (*)    All other components within normal limits  I-STAT CG4 LACTIC ACID, ED - Abnormal; Notable for the following:    Lactic Acid, Venous 4.65 (*)    All other components within normal limits  CULTURE, BLOOD (ROUTINE X 2)  CULTURE, BLOOD (ROUTINE X 2)  PROTIME-INR  URINALYSIS, ROUTINE W REFLEX MICROSCOPIC    EKG  EKG Interpretation None       Radiology No results found.  Procedures Procedures (including critical care time)  Medications Ordered in ED Medications  piperacillin-tazobactam (ZOSYN) IVPB 3.375 g (not administered)  vancomycin (VANCOCIN) 2,500 mg in sodium chloride 0.9 % 500 mL IVPB (2,500 mg Intravenous New Bag/Given 03/25/17 1554)  acetaminophen (TYLENOL) tablet 650 mg (650 mg Oral Given 03/25/17 1409)  sodium chloride 0.9 % bolus 2,000 mL (2,000 mLs Intravenous New Bag/Given 03/25/17 1556)     Initial  Impression / Assessment and Plan / ED Course  I have reviewed the triage vital signs and the nursing notes.  Pertinent labs & imaging results that were available during my care of the patient were reviewed by me and considered in my medical decision making (see chart for details).    Patient reports history of CHF and previous "fluid overload" when being treated for sepsis. Will use caution and diligence when approaching her fluids. Await lactate and labs. Plan cultures, vancomycin, Zosyn. We'll reassess. Is tachycardic and febrile here but not hypotensive.  15:30:  Patient weight 459lbs (209Kg). Patient is quite concerned about congestive heart failure as she states last time they "gave me too much fluid". At 30/kg of her actual bodyweight this would be over 6 L. Calculating ideal body weight gives ideal body weight of 50.5 kg for 5 foot 2 inch woman. This calculates to 1500 mL. 2 L has been initiated for her as she has tolerated this in the past. Will follow closely and reassess frequently.  Final Clinical Impressions(s) / ED Diagnoses   Final diagnoses:  Cellulitis of right lower extremity    [place] fluids infusing. Plan repeat lactates per sepsis protocol. His been given IV antibiotics. Await return call from admitting physicians.  CRITICAL CARE Performed by: Tanna Furry JOSEPH   Total critical care time: 30 minutes  Critical care time was exclusive of separately billable procedures and treating other patients.  Critical care was necessary to treat or prevent imminent or life-threatening deterioration.  Critical care was time spent personally by me on the following activities: development of treatment plan with patient and/or surrogate as well as nursing, discussions with consultants, evaluation of patient's response to treatment, examination of patient, obtaining history from patient or surrogate, ordering and performing treatments and interventions, ordering and review of  laboratory studies, ordering and review of radiographic studies, pulse oximetry and re-evaluation of patient's condition. Care   New Prescriptions New Prescriptions   No medications on file     Tanna Furry, MD 03/25/17 1624

## 2017-03-25 NOTE — H&P (Signed)
[facility] History and Physical  [name] WLN:[id number] DOB: 06-03-1981 DOA: 03/25/2017  Referring physician:  PCP: Alexandra Po, FNP   Chief Complaint: "I got cellulitis again."  HPI: Alexandra Beck is a 36 y.o. female  with past medical history significant for anemia, heart failure, diabetes, hypertension, hypothyroidism, obstructive sleep apnea, recurrent leg cellulitis presents emergency room with right leg cellulitis. Patient states this arose over the last 24 hours. Denies any trauma to the family. This is happened roughly every 2 months since last summer. Patient has no history of antibody resistance. Patient has been swab positive for MRSA in the past but did not get treated for it. Patient states her last hematoma A1c was 6.6 and feels her diabetes is well-controlled. Symptoms that brought the patient in today include pain, erythema, nausea and fever.  ED course: Patient given bolus of fluids based on ideal body weight, started on vancomycin and Zosyn. Hospitalist consulted for admission.   Review of Systems:  As per HPI otherwise 10 point review of systems negative.    Past Medical History:  Diagnosis Date  . Acute renal failure (ARF) ([place]) 11/2012    multifactorial-likely secondary to ATN in the setting of sepsis and hypotension, and also from rhabdomyolysis  . Anemia   . Chronic diastolic CHF (congestive heart failure) ([place])   . Depression   . Diabetes mellitus ([name])   . GERD (gastroesophageal reflux disease)   . Hyperlipidemia   . Hypertension   . Hypothyroidism   . Morbid obesity with BMI of 70 and over, adult ([place])   . Obesity hypoventilation syndrome ([place])   . OSA (obstructive sleep apnea)   . PVC's (premature ventricular contractions)   . Recurrent cellulitis of lower leg    LLE, venous insuff  . Sepsis ([place]) 11/2012   Secondary to cellulitis  . Venous insufficiency of leg    Past Surgical History:  Procedure Laterality Date  . WISDOM TOOTH  EXTRACTION     Social History:  reports that she has quit smoking. She quit after 0.00 years of use. She has never used smokeless tobacco. She reports that she does not drink alcohol or use drugs.  Allergies  Allergen Reactions  . Aspirin Hives and Swelling    REACTION: throat swelling, hives  . Doxycycline Other (See Comments)    Abdominal pain  . Lisinopril Cough  . Niaspan [Niacin Er]     Caused flushing    Family History  Problem Relation Age of Onset  . Hypertension Mother   . Hypertension Father   . Heart disease Father     before age 33     Prior to Admission medications   Medication Sig Start Date End Date Taking? Authorizing Provider  furosemide (LASIX) 40 MG tablet Take 60 mg by mouth 2 (two) times daily.   Yes Historical Provider, MD  JARDIANCE 10 MG TABS tablet TAKE 1 TABLET BY MOUTH DAILY 02/08/17  Yes Golden Circle, FNP  levothyroxine (SYNTHROID, LEVOTHROID) 150 MCG tablet TAKE 1 TABLET BY MOUTH EVERY MORNING BEFORE BREAKFAST 02/08/17  Yes Golden Circle, FNP  losartan (COZAAR) 25 MG tablet Take 25 mg by mouth daily.   Yes Historical Provider, MD  metolazone (ZAROXOLYN) 2.5 MG tablet Take 1 tablet by mouth 1 time a week on Fridays before taking lasix 12/20/16  Yes Dayna N Dunn, PA-C  metoprolol succinate (TOPROL-XL) 50 MG 24 hr tablet TAKE 1 TABLET BY MOUTH EVERY DAY WITH OR FOLLOWING A MEAL 02/08/17  Yes Golden Circle, FNP  potassium chloride SA (K-DUR,KLOR-CON) 20 MEQ tablet Take 2 tablets (40 mEq total) by mouth daily. 01/06/17  Yes Dayna N Dunn, PA-C  PREVIDENT 5000 BOOSTER PLUS 1.1 % PSTE BRUSH ON TEETH BID FOR 2 MINS EACH TIME. EXPECTORATE AS MUCH AS POSSIBLE THEN DO NOT SWISH EAT OR DRINK FOR 30 MINS AFTER TREATMENT 06/24/16  Yes Historical Provider, MD  rosuvastatin (CRESTOR) 10 MG tablet Take 1 tablet (10 mg total) by mouth daily. 10/25/16  Yes Golden Circle, FNP  metolazone (ZAROXOLYN) 2.5 MG tablet Take 1 tablet by mouth 1 time a week on Fridays before  taking lasix Patient not taking: Reported on 03/25/2017 02/09/17   Charlie Pitter, PA-C   Physical Exam: Vitals:   03/25/17 1515 03/25/17 1630 03/25/17 1700 03/25/17 1730  BP: (!) 110/51 118/61 (!) 105/58 (!) 114/55  Pulse: 93 85 85 83  Resp:      Temp:      TempSrc:      SpO2: 91% 92% 93% 95%  Weight:      Height:        Wt Readings from Last 3 Encounters:  03/25/17 (!) 208.3 kg (459 lb 5 oz)  01/20/17 (!) 206.4 kg (455 lb)  01/13/17 (!) 208.2 kg (458 lb 14.4 oz)    General:  Appears calm and comfortable, A&Ox3 Eyes:  PERRL, EOMI, normal lids, iris ENT:  grossly normal hearing, lips & tongue Neck:  no LAD, masses or thyromegaly Cardiovascular:  RRR, no m/r/g. No LE edema.  Respiratory:  CTA bilaterally, no w/r/r. Normal respiratory effort. Abdomen:  soft, ntnd Skin:  Circumferential erythema from the ankle to just below the knee. Musculoskeletal:  grossly normal tone BUE/BLE Psychiatric:  grossly normal mood and affect, speech fluent and appropriate Neurologic:  CN 2-12 grossly intact, moves all extremities in coordinated fashion.          Labs on Admission:  Basic Metabolic Panel:  Recent Labs Lab 03/25/17 1506  NA 135  K 3.6  CL 94*  CO2 28  GLUCOSE 170*  BUN 13  CREATININE 1.22*  CALCIUM 9.6   Liver Function Tests:  Recent Labs Lab 03/25/17 1506  AST 27  ALT 15  ALKPHOS 73  BILITOT 1.6*  PROT 8.0  ALBUMIN 3.7   No results for input(s): LIPASE, AMYLASE in the last 168 hours. No results for input(s): AMMONIA in the last 168 hours. CBC:  Recent Labs Lab 03/25/17 1506  WBC 21.7*  NEUTROABS 19.9*  HGB 14.2  HCT 45.4  MCV 87.5  PLT 229   Cardiac Enzymes: No results for input(s): CKTOTAL, CKMB, CKMBINDEX, TROPONINI in the last 168 hours.  BNP (last 3 results) No results for input(s): BNP in the last 8760 hours.  ProBNP (last 3 results)  Recent Labs  08/30/16 1413  PROBNP 641.0*     Serum creatinine: 1.22 mg/dL (H) 03/25/17  1506 Estimated creatinine clearance: 109.9 mL/min (A)  CBG: No results for input(s): GLUCAP in the last 168 hours.  Radiological Exams on Admission: No results found.  EKG: no new  Assessment/Plan Principal Problem:   Sepsis ([place]) Active Problems:   Essential hypertension   OSA (obstructive sleep apnea)   Diabetes mellitus type 2 in obese [facility])   Hypothyroidism  Sepsis 2/2 cellulitis Patient hemodynamically stable Given vanc emergency room, will continue Given zosyn in the emergency room, will continue Urine culture pending Blood cultures 2 pending Patient given 2000 mL of fluid in the emergency  room Lactic acid pos, will trend Lipase normal  Hypertension When necessary hydralazine 10 mg IV as needed for severe blood pressure Cont Cozaar, toprol, kdur  DM SSI ACHS  Hypothyroidism Cont OP synthroid 150 mcg qd No signs of hyper or hypothyroidism  OSA CPAP  Hyperlipidemia Continue statin  LE Edema Hold lasix, zaroxlyn   Code Status: FULL DVT Prophylaxis: lovenox Family Communication: mother at bedside Disposition Plan: Pending Improvement  Status: tele obs  Elwin Mocha, MD Family Medicine [facility] [email or web address] Password [email or web address]

## 2017-03-25 NOTE — ED Triage Notes (Signed)
Pt has circumferential redness & swelling noted to R lower extremity, hx of cellulitis, pt febrile & tachycardic in triage, A&O x4

## 2017-03-25 NOTE — Progress Notes (Signed)
Pharmacy Antibiotic Note  Alexandra Beck is a 36 y.o. female admitted on 03/25/2017 with cellulitis.  Pharmacy has been consulted for vancomycin dosing. Patient is morbidly obese, 208 kg, ordered IV vancomycin loading dose 2500 mg. Baseline labs show renal function is at baseline, SCr 1.22, estimated CrCl > 100 mL/min.   Plan: - Vancomycin 2500 mg IV x1 loading dose - Vancomycin 1500 mg IV q8h - Zosyn 3.375g IV q8h - Monitor renal function, C&S and duration of treatment - Monitor vancomycin trough as needed  Height: 4\' 11"  (149.9 cm) Weight: (!) 459 lb 5 oz (208.3 kg) IBW/kg (Calculated) : 43.2  Temp (24hrs), Avg:102 F (38.9 C), Min:101.1 F (38.4 C), Max:102.8 F (39.3 C)   Recent Labs Lab 03/25/17 1506 03/25/17 1528  WBC 21.7*  --   CREATININE 1.22*  --   LATICACIDVEN  --  4.65*    Estimated Creatinine Clearance: 109.9 mL/min (A) (by C-G formula based on SCr of 1.22 mg/dL (H)).    Allergies  Allergen Reactions  . Aspirin Hives and Swelling    REACTION: throat swelling, hives  . Doxycycline Other (See Comments)    Abdominal pain  . Lisinopril Cough  . Niaspan [Niacin Er]     Caused flushing    Antimicrobials this admission: Vancomycin 4/28 >>  Zosyn 4/28 >>   Dose adjustments this admission:  Microbiology results: 4/28 BCx: sent  Thank you for allowing pharmacy to be a part of this patient's care.  Dimitri Ped, PharmD, BCPS PGY-2 Infectious Diseases Pharmacy Resident Pager: [phone] 03/25/2017 4:19 PM

## 2017-03-25 NOTE — ED Notes (Signed)
Dr Jeneen Rinks given a copy of lactic acid results 4.65

## 2017-03-26 ENCOUNTER — Observation Stay (HOSPITAL_COMMUNITY): Payer: BC Managed Care – PPO

## 2017-03-26 DIAGNOSIS — I10 Essential (primary) hypertension: Secondary | ICD-10-CM | POA: Diagnosis not present

## 2017-03-26 DIAGNOSIS — B9689 Other specified bacterial agents as the cause of diseases classified elsewhere: Secondary | ICD-10-CM | POA: Diagnosis not present

## 2017-03-26 DIAGNOSIS — I509 Heart failure, unspecified: Secondary | ICD-10-CM | POA: Diagnosis not present

## 2017-03-26 DIAGNOSIS — Z886 Allergy status to analgesic agent status: Secondary | ICD-10-CM | POA: Diagnosis not present

## 2017-03-26 DIAGNOSIS — I110 Hypertensive heart disease with heart failure: Secondary | ICD-10-CM | POA: Diagnosis present

## 2017-03-26 DIAGNOSIS — I959 Hypotension, unspecified: Secondary | ICD-10-CM | POA: Diagnosis present

## 2017-03-26 DIAGNOSIS — E109 Type 1 diabetes mellitus without complications: Secondary | ICD-10-CM | POA: Diagnosis not present

## 2017-03-26 DIAGNOSIS — Z7984 Long term (current) use of oral hypoglycemic drugs: Secondary | ICD-10-CM | POA: Diagnosis not present

## 2017-03-26 DIAGNOSIS — G4733 Obstructive sleep apnea (adult) (pediatric): Secondary | ICD-10-CM | POA: Diagnosis present

## 2017-03-26 DIAGNOSIS — Z881 Allergy status to other antibiotic agents status: Secondary | ICD-10-CM | POA: Diagnosis not present

## 2017-03-26 DIAGNOSIS — Z87891 Personal history of nicotine dependence: Secondary | ICD-10-CM | POA: Diagnosis not present

## 2017-03-26 DIAGNOSIS — D649 Anemia, unspecified: Secondary | ICD-10-CM | POA: Diagnosis present

## 2017-03-26 DIAGNOSIS — E669 Obesity, unspecified: Secondary | ICD-10-CM | POA: Diagnosis not present

## 2017-03-26 DIAGNOSIS — Z8614 Personal history of Methicillin resistant Staphylococcus aureus infection: Secondary | ICD-10-CM | POA: Diagnosis not present

## 2017-03-26 DIAGNOSIS — A419 Sepsis, unspecified organism: Secondary | ICD-10-CM | POA: Diagnosis present

## 2017-03-26 DIAGNOSIS — I5032 Chronic diastolic (congestive) heart failure: Secondary | ICD-10-CM | POA: Diagnosis present

## 2017-03-26 DIAGNOSIS — Z888 Allergy status to other drugs, medicaments and biological substances status: Secondary | ICD-10-CM | POA: Diagnosis not present

## 2017-03-26 DIAGNOSIS — E039 Hypothyroidism, unspecified: Secondary | ICD-10-CM | POA: Diagnosis present

## 2017-03-26 DIAGNOSIS — Z6841 Body Mass Index (BMI) 40.0 and over, adult: Secondary | ICD-10-CM | POA: Diagnosis not present

## 2017-03-26 DIAGNOSIS — Z8249 Family history of ischemic heart disease and other diseases of the circulatory system: Secondary | ICD-10-CM | POA: Diagnosis not present

## 2017-03-26 DIAGNOSIS — E785 Hyperlipidemia, unspecified: Secondary | ICD-10-CM | POA: Diagnosis present

## 2017-03-26 DIAGNOSIS — E876 Hypokalemia: Secondary | ICD-10-CM | POA: Diagnosis not present

## 2017-03-26 DIAGNOSIS — L03115 Cellulitis of right lower limb: Secondary | ICD-10-CM | POA: Diagnosis present

## 2017-03-26 DIAGNOSIS — E1169 Type 2 diabetes mellitus with other specified complication: Secondary | ICD-10-CM | POA: Diagnosis not present

## 2017-03-26 DIAGNOSIS — E872 Acidosis: Secondary | ICD-10-CM | POA: Diagnosis present

## 2017-03-26 DIAGNOSIS — E119 Type 2 diabetes mellitus without complications: Secondary | ICD-10-CM | POA: Diagnosis present

## 2017-03-26 LAB — CBC
HEMATOCRIT: 38.3 % (ref 36.0–46.0)
HEMOGLOBIN: 11.8 g/dL — AB (ref 12.0–15.0)
MCH: 27.1 pg (ref 26.0–34.0)
MCHC: 30.8 g/dL (ref 30.0–36.0)
MCV: 88 fL (ref 78.0–100.0)
Platelets: 166 10*3/uL (ref 150–400)
RBC: 4.35 MIL/uL (ref 3.87–5.11)
RDW: 17.6 % — ABNORMAL HIGH (ref 11.5–15.5)
WBC: 11.9 10*3/uL — ABNORMAL HIGH (ref 4.0–10.5)

## 2017-03-26 LAB — BASIC METABOLIC PANEL
ANION GAP: 9 (ref 5–15)
BUN: 12 mg/dL (ref 6–20)
CALCIUM: 8.4 mg/dL — AB (ref 8.9–10.3)
CO2: 31 mmol/L (ref 22–32)
Chloride: 96 mmol/L — ABNORMAL LOW (ref 101–111)
Creatinine, Ser: 1.08 mg/dL — ABNORMAL HIGH (ref 0.44–1.00)
Glucose, Bld: 129 mg/dL — ABNORMAL HIGH (ref 65–99)
POTASSIUM: 3.6 mmol/L (ref 3.5–5.1)
Sodium: 136 mmol/L (ref 135–145)

## 2017-03-26 LAB — MRSA PCR SCREENING: MRSA by PCR: NEGATIVE

## 2017-03-26 LAB — LACTIC ACID, PLASMA
LACTIC ACID, VENOUS: 0.9 mmol/L (ref 0.5–1.9)
LACTIC ACID, VENOUS: 1.5 mmol/L (ref 0.5–1.9)

## 2017-03-26 LAB — GLUCOSE, CAPILLARY
GLUCOSE-CAPILLARY: 119 mg/dL — AB (ref 65–99)
GLUCOSE-CAPILLARY: 121 mg/dL — AB (ref 65–99)
Glucose-Capillary: 141 mg/dL — ABNORMAL HIGH (ref 65–99)
Glucose-Capillary: 128 mg/dL — ABNORMAL HIGH (ref 65–99)

## 2017-03-26 MED ORDER — FUROSEMIDE 40 MG PO TABS
60.0000 mg | ORAL_TABLET | Freq: Two times a day (BID) | ORAL | Status: DC
  Administered 2017-03-26 – 2017-03-30 (×9): 60 mg via ORAL
  Filled 2017-03-26 (×9): qty 1

## 2017-03-26 MED ORDER — ENOXAPARIN SODIUM 100 MG/ML ~~LOC~~ SOLN
100.0000 mg | SUBCUTANEOUS | Status: DC
  Filled 2017-03-26 (×3): qty 1

## 2017-03-26 NOTE — Progress Notes (Signed)
Patient transferred from 4N to 3F58. 6e02 cardiac monitor applied. 2 RNs assessed skin. Noted cellulitis to R lower extremity. Vancomycin infusing to LFA PIV infiltrated. IV team notified. VSS. Will continue to monitor. Bartholomew Crews, RN

## 2017-03-26 NOTE — Progress Notes (Signed)
[facility]                                                                              Patient Demographics  Alexandra Beck, is a 36 y.o. female, DOB - 1981-02-27, PPI:[id number]  Admit date - 03/25/2017   Admitting Physician Elwin Mocha, MD  Outpatient Primary MD for the patient is Mauricio Po, [place]  Outpatient specialists:   LOS - 0  days    No chief complaint on file.      Brief summary   Patient is a 36 year old female with anemia, CHF, diabetes, hypertension, hypothyroidism, OSA, recurrent leg cellulitis presented with right leg cellulitis, symptoms worsening over the 24 hours prior to admission. No trauma. Patient was given IV fluid bolus in ED and started on IV vancomycin and Zosyn.   Assessment & Plan    Principal Problem:  Sepsis and due to right lower extremity cellulitis  - Patient met sepsis criteria secondary to lactic acidosis, leukocytosis, cellulitis, hypotension, fevers - Currently stable, follow blood cultures - Continue IV vancomycin and Zosyn - Patient received 2 L of IV fluids in ER, lactic acid was 4.65, now normalized, DC IV fluids  Hypertension - BP currently stable, DC IV fluids  - Cont Cozaar, toprol, kdur  Diabetes mellitus - Continue sliding scale insulin - Follow hemoglobin A1c  Hypothyroidism Cont OP synthroid 150 mcg qd  OSA Continue CPAP   Hyperlipidemia Continue statin  LE Edema Restart Lasix   Code Status: Full CODE STATUS DVT Prophylaxis:  Lovenox  Family Communication: Discussed in detail with the patient, all imaging results, lab results explained to the patient   Disposition Plan: Transfer to telemetry floor  Time Spent in minutes  29mnutes  Procedures:  None  Consultants:   None  Antimicrobials:   IV vancomycin 4/28>  IV Zosyn 4/28>   Medications  Scheduled Meds: . enoxaparin (LOVENOX) injection  40 mg Subcutaneous Q24H  . insulin aspart  0-20 Units Subcutaneous TID WC    . levothyroxine  150 mcg Oral QAC breakfast  . losartan  25 mg Oral Daily  . rosuvastatin  10 mg Oral QHS  . sodium chloride flush  3 mL Intravenous Q12H   Continuous Infusions: . piperacillin-tazobactam (ZOSYN)  IV 3.375 g (03/26/17 0530)  . vancomycin Stopped (03/26/17 0415)   PRN Meds:.acetaminophen **OR** acetaminophen, hydrALAZINE, HYDROmorphone (DILAUDID) injection, ondansetron **OR** ondansetron (ZOFRAN) IV   Antibiotics   Anti-infectives    Start     Dose/Rate Route Frequency Ordered Stop   03/25/17 2300  vancomycin (VANCOCIN) 1,500 mg in sodium chloride 0.9 % 500 mL IVPB     1,500 mg 250 mL/hr over 120 Minutes Intravenous Every 8 hours 03/25/17 1628     03/25/17 2200  piperacillin-tazobactam (ZOSYN) IVPB 3.375 g     3.375 g 12.5 mL/hr over 240 Minutes Intravenous Every 8 hours 03/25/17 1628     03/25/17 1515  piperacillin-tazobactam (ZOSYN) IVPB 3.375 g     3.375 g 100 mL/hr over 30 Minutes Intravenous  Once 03/25/17 1504 03/25/17 2011   03/25/17 1515  vancomycin (VANCOCIN) 2,500 mg in sodium chloride 0.9 % 500 mL  IVPB     2,500 mg 250 mL/hr over 120 Minutes Intravenous  Once 03/25/17 1508 03/25/17 1848        Subjective:   Alexandra Beck was seen and examined today. Feeling better, resting with CPAP on at the time of my examination. Right lower extremity cellulitis with erythema and mild tenderness on palpation. Patient denies dizziness, chest pain, shortness of breath, abdominal pain, N/V/D/C, new weakness, numbess, tingling. No acute events overnight.    Objective:   Vitals:   03/26/17 0100 03/26/17 0331 03/26/17 0400 03/26/17 0733  BP: (!) 104/52 (!) 105/49 111/60 (!) 141/69  Pulse: 85 82 83 87  Resp: '16 16 16 19  ' Temp:  99.9 F (37.7 C)  99.2 F (37.3 C)  TempSrc:  Axillary  Axillary  SpO2: 92% 92% 92% 93%  Weight:  (!) 209 kg (460 lb 11.2 oz)    Height:        Intake/Output Summary (Last 24 hours) at 03/26/17 1002 Last data filed at 03/26/17 0600   Gross per 24 hour  Intake             3880 ml  Output              900 ml  Net             2980 ml     Wt Readings from Last 3 Encounters:  03/26/17 (!) 209 kg (460 lb 11.2 oz)  01/20/17 (!) 206.4 kg (455 lb)  01/13/17 (!) 208.2 kg (458 lb 14.4 oz)     Exam  General: Alert and oriented x 3, NAD  HEENT:    Neck: Supple, no JVD  Cardiovascular: S1 S2 auscultated, no rubs, murmurs or gallops. Regular rate and rhythm.  Respiratory: Clear to auscultation bilaterally, no wheezing, rales or rhonchi  Gastrointestinal:Morbidly obese Soft, nontender, nondistended, + bowel sounds  Ext: no cyanosis clubbing,   Neuro: AAOx3, Cr N's II- XII. Strength 5/5 upper and lower extremities bilaterally  Skin: Right lower extremity with cellulitis, erythema, warmth and mild tenderness to deep palpation  Psych : normal affect and demeanor, alert and oriented x3    Data Reviewed:  I have personally reviewed following labs and imaging studies  Micro Results Recent Results (from the past 240 hour(s))  MRSA PCR Screening     Status: None   Collection Time: 03/25/17  9:47 PM  Result Value Ref Range Status   MRSA by PCR NEGATIVE NEGATIVE Final    Comment:        The GeneXpert MRSA Assay (FDA approved for NASAL specimens only), is one component of a comprehensive MRSA colonization surveillance program. It is not intended to diagnose MRSA infection nor to guide or monitor treatment for MRSA infections.     Radiology Reports No results found.  Lab Data:  CBC:  Recent Labs Lab 03/25/17 1506 03/25/17 2109 03/26/17 0307  WBC 21.7* 13.6* 11.9*  NEUTROABS 19.9*  --   --   HGB 14.2 12.8 11.8*  HCT 45.4 41.2 38.3  MCV 87.5 86.9 88.0  PLT 229 171 671   Basic Metabolic Panel:  Recent Labs Lab 03/25/17 1506 03/25/17 2109 03/26/17 0307  NA 135  --  136  K 3.6  --  3.6  CL 94*  --  96*  CO2 28  --  31  GLUCOSE 170*  --  129*  BUN 13  --  12  CREATININE 1.22* 1.07* 1.08*    CALCIUM 9.6  --  8.4*   GFR: Estimated Creatinine Clearance: 129.3 mL/min (A) (by C-G formula based on SCr of 1.08 mg/dL (H)). Liver Function Tests:  Recent Labs Lab 03/25/17 1506  AST 27  ALT 15  ALKPHOS 73  BILITOT 1.6*  PROT 8.0  ALBUMIN 3.7   No results for input(s): LIPASE, AMYLASE in the last 168 hours. No results for input(s): AMMONIA in the last 168 hours. Coagulation Profile:  Recent Labs Lab 03/25/17 1506  INR 1.03   Cardiac Enzymes: No results for input(s): CKTOTAL, CKMB, CKMBINDEX, TROPONINI in the last 168 hours. BNP (last 3 results)  Recent Labs  08/30/16 1413  PROBNP 641.0*   HbA1C: No results for input(s): HGBA1C in the last 72 hours. CBG:  Recent Labs Lab 03/25/17 2152 03/26/17 0733  GLUCAP 139* 119*   Lipid Profile: No results for input(s): CHOL, HDL, LDLCALC, TRIG, CHOLHDL, LDLDIRECT in the last 72 hours. Thyroid Function Tests: No results for input(s): TSH, T4TOTAL, FREET4, T3FREE, THYROIDAB in the last 72 hours. Anemia Panel: No results for input(s): VITAMINB12, FOLATE, FERRITIN, TIBC, IRON, RETICCTPCT in the last 72 hours. Urine analysis:    Component Value Date/Time   COLORURINE YELLOW 08/26/2016 [place] 08/26/2016 0449   LABSPEC 1.013 08/26/2016 0449   PHURINE 5.5 08/26/2016 0449   GLUCOSEU NEGATIVE 08/26/2016 0449   HGBUR TRACE (A) 08/26/2016 0449   BILIRUBINUR NEGATIVE 08/26/2016 0449   KETONESUR NEGATIVE 08/26/2016 0449   PROTEINUR 100 (A) 08/26/2016 0449   UROBILINOGEN 0.2 07/25/2014 1040   NITRITE NEGATIVE 08/26/2016 0449   LEUKOCYTESUR NEGATIVE 08/26/2016 0449     Alexandra Beck M.D. [facility] 03/26/2017, 10:02 AM  Pager: [phone] Between 7am to 7pm - call Pager - [phone]  After 7pm go to [email or web address] - password [email or web address]  Call night coverage person covering after 7pm

## 2017-03-26 NOTE — Progress Notes (Signed)
Pt home CPAP set up at bedside. Pt states she can put on when ready

## 2017-03-26 NOTE — Progress Notes (Signed)
Pt refused lovenox shot.  Educated pt.  Saunders Revel T

## 2017-03-26 NOTE — Progress Notes (Signed)
Attempted PICC placement x2.  Unable to thread Cephalic centrally. Unable to access brachial vein.  Pt tolerated with tears and nervousness with 2nd attempt causing constriction of vessels.  Was able to get 20 G PIV via ultrasound.  Pt has very fragile veins.  Please refer to IR if PICC desired.

## 2017-03-26 NOTE — Progress Notes (Signed)
Dear Doctor: Tana Coast This patient has been identified as a candidate for PICC for the following reason (s): IV therapy over 48 hours, drug pH or osmolality (causing phlebitis, infiltration in 24 hours), drug extravasation potential with tissue necrosis (KCL, Dilantin, Dopamine, CaCl, MgSO4, chemo vesicant), poor veins/poor circulatory system (CHF, COPD, emphysema, diabetes, steroid use, IV drug abuse, etc.) and restarts due to phlebitis and infiltration in 24 hours If you agree, please write an order for the indicated device. For any questions contact the Vascular Access Team at [phone] if no answer, please leave a message.  Thank you for supporting the early vascular access assessment program.

## 2017-03-27 LAB — URINALYSIS, ROUTINE W REFLEX MICROSCOPIC
BILIRUBIN URINE: NEGATIVE
Bacteria, UA: NONE SEEN
GLUCOSE, UA: 50 mg/dL — AB
KETONES UR: NEGATIVE mg/dL
LEUKOCYTES UA: NEGATIVE
NITRITE: NEGATIVE
PROTEIN: 30 mg/dL — AB
Specific Gravity, Urine: 1.009 (ref 1.005–1.030)
pH: 6 (ref 5.0–8.0)

## 2017-03-27 LAB — GLUCOSE, CAPILLARY
GLUCOSE-CAPILLARY: 134 mg/dL — AB (ref 65–99)
Glucose-Capillary: 135 mg/dL — ABNORMAL HIGH (ref 65–99)
Glucose-Capillary: 124 mg/dL — ABNORMAL HIGH (ref 65–99)
Glucose-Capillary: 123 mg/dL — ABNORMAL HIGH (ref 65–99)

## 2017-03-27 NOTE — Progress Notes (Signed)
Pt refusing to get new IV, old IV had to be removed because of infiltration. MD notified.

## 2017-03-27 NOTE — Progress Notes (Signed)
[facility]                                                                              Patient Demographics  Alexandra Beck, is a 36 y.o. female, DOB - 24-Apr-1981, OBS:[id number]  Admit date - 03/25/2017   Admitting Physician Elwin Mocha, MD  Outpatient Primary MD for the patient is Mauricio Po, [place]  Outpatient specialists:   LOS - 1  days    No chief complaint on file.      Brief summary   Patient is a 36 year old female with anemia, CHF, diabetes, hypertension, hypothyroidism, OSA, recurrent leg cellulitis presented with right leg cellulitis, symptoms worsening over the 24 hours prior to admission. No trauma. Patient was given IV fluid bolus in ED and started on IV vancomycin and Zosyn.   Assessment & Plan    Principal Problem:  Sepsis and due to right lower extremity cellulitis  - Patient met sepsis criteria secondary to lactic acidosis, leukocytosis, cellulitis, hypotension, fevers - Currently stable, follow blood cultures - Continue IV vancomycin and Zosyn - Patient received 2 L of IV fluids in ER, lactic acid was 4.65, now normalized,IV fluids were discontinued  Hypertension - BP currently stable - Cont Cozaar, toprol, kdur  Diabetes mellitus - Continue sliding scale insulin - Follow hemoglobin A1c  Hypothyroidism Cont OP synthroid 150 mcg qd  OSA Continue CPAP   Hyperlipidemia Continue statin  LE Edema Restarted Lasix   Code Status: Full CODE STATUS DVT Prophylaxis:  Lovenox  Family Communication: Discussed in detail with the patient, all imaging results, lab results explained to the patient   Disposition Plan:   Time Spent in minutes  78mnutes  Procedures:  None  Consultants:   None  Antimicrobials:   IV vancomycin 4/28>  IV Zosyn 4/28>   Medications  Scheduled Meds: . enoxaparin (LOVENOX) injection  100 mg Subcutaneous Q24H  . furosemide  60 mg Oral BID  . insulin aspart  0-20 Units Subcutaneous  TID WC  . levothyroxine  150 mcg Oral QAC breakfast  . losartan  25 mg Oral Daily  . rosuvastatin  10 mg Oral QHS  . sodium chloride flush  3 mL Intravenous Q12H   Continuous Infusions: . piperacillin-tazobactam (ZOSYN)  IV Stopped (03/27/17 0151)  . vancomycin Stopped (03/26/17 2350)   PRN Meds:.acetaminophen **OR** acetaminophen, hydrALAZINE, HYDROmorphone (DILAUDID) injection, ondansetron **OR** ondansetron (ZOFRAN) IV   Antibiotics   Anti-infectives    Start     Dose/Rate Route Frequency Ordered Stop   03/25/17 2300  vancomycin (VANCOCIN) 1,500 mg in sodium chloride 0.9 % 500 mL IVPB     1,500 mg 250 mL/hr over 120 Minutes Intravenous Every 8 hours 03/25/17 1628     03/25/17 2200  piperacillin-tazobactam (ZOSYN) IVPB 3.375 g     3.375 g 12.5 mL/hr over 240 Minutes Intravenous Every 8 hours 03/25/17 1628     03/25/17 1515  piperacillin-tazobactam (ZOSYN) IVPB 3.375 g     3.375 g 100 mL/hr over 30 Minutes Intravenous  Once 03/25/17 1504 03/25/17 2011   03/25/17 1515  vancomycin (VANCOCIN) 2,500 mg in sodium chloride 0.9 % 500 mL IVPB  2,500 mg 250 mL/hr over 120 Minutes Intravenous  Once 03/25/17 1508 03/25/17 1848        Subjective:   Alexandra Beck was seen and examined today. Still has cellulitis on the right lower extremity, erythema and mild tenderness, warmth.  Patient denies dizziness, chest pain, shortness of breath, abdominal pain, N/V/D/C, new weakness, numbess, tingling. No acute events overnight.    Objective:   Vitals:   03/26/17 2125 03/27/17 0026 03/27/17 0546 03/27/17 0902  BP: 134/61  (!) 109/52 (!) 146/77  Pulse: 99  87 87  Resp: 17   18  Temp: (!) 100.8 F (38.2 C) 99 F (37.2 C) 98.9 F (37.2 C) 98.9 F (37.2 C)  TempSrc: Oral Oral Oral Oral  SpO2: 90%  90% 91%  Weight:      Height:        Intake/Output Summary (Last 24 hours) at 03/27/17 1220 Last data filed at 03/27/17 1000  Gross per 24 hour  Intake             1632 ml  Output               700 ml  Net              932 ml     Wt Readings from Last 3 Encounters:  03/26/17 (!) 209 kg (460 lb 11.2 oz)  01/20/17 (!) 206.4 kg (455 lb)  01/13/17 (!) 208.2 kg (458 lb 14.4 oz)     Exam  General: Alert and oriented x 3, NAD  HEENT:    Neck: Supple, no JVD  Cardiovascular: S1 S2 Clear, RRR  Respiratory: CTAB  Gastrointestinal:Morbidly obese Soft, nontender, nondistended, + bowel sounds  Ext: no cyanosis clubbing,   Neuro: No new deficits  Skin: Right lower extremity with cellulitis, erythema, warmth and mild tenderness to deep palpation  Psych : normal affect and demeanor, alert and oriented x3    Data Reviewed:  I have personally reviewed following labs and imaging studies  Micro Results Recent Results (from the past 240 hour(s))  Culture, blood (Routine x 2)     Status: None (Preliminary result)   Collection Time: 03/25/17  3:06 PM  Result Value Ref Range Status   Specimen Description BLOOD LEFT ANTECUBITAL  Final   Special Requests   Final    BOTTLES DRAWN AEROBIC AND ANAEROBIC Blood Culture adequate volume   Culture NO GROWTH < 24 HOURS  Final   Report Status PENDING  Incomplete  MRSA PCR Screening     Status: None   Collection Time: 03/25/17  9:47 PM  Result Value Ref Range Status   MRSA by PCR NEGATIVE NEGATIVE Final    Comment:        The GeneXpert MRSA Assay (FDA approved for NASAL specimens only), is one component of a comprehensive MRSA colonization surveillance program. It is not intended to diagnose MRSA infection nor to guide or monitor treatment for MRSA infections.     Radiology Reports No results found.  Lab Data:  CBC:  Recent Labs Lab 03/25/17 1506 03/25/17 2109 03/26/17 0307  WBC 21.7* 13.6* 11.9*  NEUTROABS 19.9*  --   --   HGB 14.2 12.8 11.8*  HCT 45.4 41.2 38.3  MCV 87.5 86.9 88.0  PLT 229 171 826   Basic Metabolic Panel:  Recent Labs Lab 03/25/17 1506 03/25/17 2109 03/26/17 0307  NA 135  --   136  K 3.6  --  3.6  CL 94*  --  96*  CO2 28  --  31  GLUCOSE 170*  --  129*  BUN 13  --  12  CREATININE 1.22* 1.07* 1.08*  CALCIUM 9.6  --  8.4*   GFR: Estimated Creatinine Clearance: 129.3 mL/min (A) (by C-G formula based on SCr of 1.08 mg/dL (H)). Liver Function Tests:  Recent Labs Lab 03/25/17 1506  AST 27  ALT 15  ALKPHOS 73  BILITOT 1.6*  PROT 8.0  ALBUMIN 3.7   No results for input(s): LIPASE, AMYLASE in the last 168 hours. No results for input(s): AMMONIA in the last 168 hours. Coagulation Profile:  Recent Labs Lab 03/25/17 1506  INR 1.03   Cardiac Enzymes: No results for input(s): CKTOTAL, CKMB, CKMBINDEX, TROPONINI in the last 168 hours. BNP (last 3 results)  Recent Labs  08/30/16 1413  PROBNP 641.0*   HbA1C: No results for input(s): HGBA1C in the last 72 hours. CBG:  Recent Labs Lab 03/26/17 1141 03/26/17 1707 03/26/17 2124 03/27/17 0747 03/27/17 1211  GLUCAP 141* 128* 121* 135* 124*   Lipid Profile: No results for input(s): CHOL, HDL, LDLCALC, TRIG, CHOLHDL, LDLDIRECT in the last 72 hours. Thyroid Function Tests: No results for input(s): TSH, T4TOTAL, FREET4, T3FREE, THYROIDAB in the last 72 hours. Anemia Panel: No results for input(s): VITAMINB12, FOLATE, FERRITIN, TIBC, IRON, RETICCTPCT in the last 72 hours. Urine analysis:    Component Value Date/Time   COLORURINE YELLOW 08/26/2016 [place] 08/26/2016 0449   LABSPEC 1.013 08/26/2016 0449   PHURINE 5.5 08/26/2016 0449   GLUCOSEU NEGATIVE 08/26/2016 0449   HGBUR TRACE (A) 08/26/2016 0449   BILIRUBINUR NEGATIVE 08/26/2016 0449   KETONESUR NEGATIVE 08/26/2016 0449   PROTEINUR 100 (A) 08/26/2016 0449   UROBILINOGEN 0.2 07/25/2014 1040   NITRITE NEGATIVE 08/26/2016 0449   LEUKOCYTESUR NEGATIVE 08/26/2016 0449     Annell Canty M.D. [facility] 03/27/2017, 12:20 PM  Pager: [phone] Between 7am to 7pm - call Pager - [phone]  After 7pm go to  [email or web address] - password [email or web address]  Call night coverage person covering after 7pm

## 2017-03-28 ENCOUNTER — Inpatient Hospital Stay (HOSPITAL_COMMUNITY): Payer: BC Managed Care – PPO

## 2017-03-28 ENCOUNTER — Encounter (HOSPITAL_COMMUNITY): Payer: Self-pay | Admitting: Interventional Radiology

## 2017-03-28 ENCOUNTER — Other Ambulatory Visit: Payer: Self-pay | Admitting: Family

## 2017-03-28 DIAGNOSIS — E039 Hypothyroidism, unspecified: Secondary | ICD-10-CM

## 2017-03-28 HISTORY — PX: IR US GUIDE VASC ACCESS RIGHT: IMG2390

## 2017-03-28 HISTORY — PX: IR FLUORO GUIDE CV MIDLINE PICC RIGHT: IMG5212

## 2017-03-28 LAB — BASIC METABOLIC PANEL
Anion gap: 9 (ref 5–15)
BUN: 8 mg/dL (ref 6–20)
CO2: 31 mmol/L (ref 22–32)
CREATININE: 0.96 mg/dL (ref 0.44–1.00)
Calcium: 9.2 mg/dL (ref 8.9–10.3)
Chloride: 98 mmol/L — ABNORMAL LOW (ref 101–111)
GFR calc Af Amer: >60 mL/min (ref >60)
GLUCOSE: 136 mg/dL — AB (ref 65–99)
Potassium: 3.7 mmol/L (ref 3.5–5.1)
SODIUM: 138 mmol/L (ref 135–145)

## 2017-03-28 LAB — GLUCOSE, CAPILLARY
GLUCOSE-CAPILLARY: 175 mg/dL — AB (ref 65–99)
GLUCOSE-CAPILLARY: 110 mg/dL — AB (ref 65–99)
Glucose-Capillary: 96 mg/dL (ref 65–99)
Glucose-Capillary: 107 mg/dL — ABNORMAL HIGH (ref 65–99)

## 2017-03-28 MED ORDER — LIDOCAINE HCL 1 % IJ SOLN
INTRAMUSCULAR | Status: AC
  Filled 2017-03-28: qty 20

## 2017-03-28 MED ORDER — METOLAZONE 5 MG PO TABS
5.0000 mg | ORAL_TABLET | Freq: Once | ORAL | Status: AC
  Administered 2017-03-28: 5 mg via ORAL
  Filled 2017-03-28: qty 1

## 2017-03-28 MED ORDER — LIDOCAINE HCL (PF) 1 % IJ SOLN
INTRAMUSCULAR | Status: DC | PRN
Start: 2017-03-28 — End: 2017-03-30
  Administered 2017-03-28: 10 mL

## 2017-03-28 NOTE — Progress Notes (Signed)
[facility]                                                                              Patient Demographics  Alexandra Beck, is a 36 y.o. female, DOB - 07-23-1981, [id number]:[id number]  Admit date - 03/25/2017   Admitting Physician Elwin Mocha, MD  Outpatient Primary MD for the patient is Mauricio Po, [place]  Outpatient specialists:   LOS - 2  days    No chief complaint on file.      Brief summary   Patient is a 36 year old female with anemia, CHF, diabetes, hypertension, hypothyroidism, OSA, recurrent leg cellulitis presented with right leg cellulitis, symptoms worsening over the 24 hours prior to admission. No trauma. Patient was given IV fluid bolus in ED and started on IV vancomycin and Zosyn.   Assessment & Plan    Principal Problem:  Sepsis and due to right lower extremity cellulitis  - Patient met sepsis criteria secondary to lactic acidosis, leukocytosis, cellulitis, hypotension, fevers - Currently stable, Blood cultures negative so far - Continue IV vancomycin and Zosyn, Did not receive antibiotics on 4/30 due to access issues - PICC line placed by IR today, will restart antibiotics  Hypertension - BP currently stable - Cont Cozaar, toprol, kdur  Diabetes mellitus - Continue sliding scale insulin - Follow hemoglobin A1c  Hypothyroidism Cont OP synthroid 150 mcg qd  OSA Continue CPAP   Hyperlipidemia Continue statin  LE Edema Restarted Lasix, will give 1 dose of metolazone 5 mg Check BMET  Morbid obesity BMI 93, counseled patient on diet and weight control   Code Status: Full CODE STATUS DVT Prophylaxis:  Lovenox  Family Communication: Discussed in detail with the patient, all imaging results, lab results explained to the patient   Disposition Plan:   Time Spent in minutes  76mnutes  Procedures:  None  Consultants:   None  Antimicrobials:   IV vancomycin 4/28>  IV Zosyn 4/28>   Medications  Scheduled  Meds: . enoxaparin (LOVENOX) injection  100 mg Subcutaneous Q24H  . furosemide  60 mg Oral BID  . insulin aspart  0-20 Units Subcutaneous TID WC  . levothyroxine  150 mcg Oral QAC breakfast  . lidocaine      . losartan  25 mg Oral Daily  . rosuvastatin  10 mg Oral QHS  . sodium chloride flush  3 mL Intravenous Q12H   Continuous Infusions: . piperacillin-tazobactam (ZOSYN)  IV Stopped (03/27/17 0151)  . vancomycin 1,500 mg (03/28/17 0950)   PRN Meds:.acetaminophen **OR** acetaminophen, hydrALAZINE, HYDROmorphone (DILAUDID) injection, lidocaine (PF), ondansetron **OR** ondansetron (ZOFRAN) IV   Antibiotics   Anti-infectives    Start     Dose/Rate Route Frequency Ordered Stop   03/25/17 2300  vancomycin (VANCOCIN) 1,500 mg in sodium chloride 0.9 % 500 mL IVPB     1,500 mg 250 mL/hr over 120 Minutes Intravenous Every 8 hours 03/25/17 1628     03/25/17 2200  piperacillin-tazobactam (ZOSYN) IVPB 3.375 g     3.375 g 12.5 mL/hr over 240 Minutes Intravenous Every 8 hours 03/25/17 1628     03/25/17 1515  piperacillin-tazobactam (ZOSYN) IVPB 3.375 g  3.375 g 100 mL/hr over 30 Minutes Intravenous  Once 03/25/17 1504 03/25/17 2011   03/25/17 1515  vancomycin (VANCOCIN) 2,500 mg in sodium chloride 0.9 % 500 mL IVPB     2,500 mg 250 mL/hr over 120 Minutes Intravenous  Once 03/25/17 1508 03/25/17 1848        Subjective:   Alexandra Beck was seen and examined today. He had access issues yesterday, still right lower extremity with erythema, warmth and mild tenderness. Patient denies dizziness, chest pain, shortness of breath, abdominal pain, N/V/D/C, new weakness, numbess, tingling.   Objective:   Vitals:   03/27/17 1735 03/27/17 2054 03/27/17 2220 03/28/17 0413  BP: (!) 150/78 (!) 157/89  (!) 141/70  Pulse: 84 84  92  Resp:  17  18  Temp: 98.6 F (37 C) 99.2 F (37.3 C)  99.3 F (37.4 C)  TempSrc: Oral     SpO2: 94% 91%  93%  Weight:   (!) 206.8 kg (456 lb)   Height:         Intake/Output Summary (Last 24 hours) at 03/28/17 0955 Last data filed at 03/28/17 0601  Gross per 24 hour  Intake              342 ml  Output             3000 ml  Net            -2658 ml     Wt Readings from Last 3 Encounters:  03/27/17 (!) 206.8 kg (456 lb)  01/20/17 (!) 206.4 kg (455 lb)  01/13/17 (!) 208.2 kg (458 lb 14.4 oz)     Exam  General: Alert and oriented x 3, NAD  HEENT:    Neck: Supple, no JVD  Cardiovascular: S1 S2 Clear, RRR  Respiratory: CTAB  Gastrointestinal: Morbidly obese Soft, NT, ND, NBS  Ext: no cyanosis clubbing,   Neuro: No new deficits  Skin: Right lower extremity with cellulitis, erythema, warmth and mild tenderness to deep palpation  Psych : normal affect and demeanor, alert and oriented x3    Data Reviewed:  I have personally reviewed following labs and imaging studies  Micro Results Recent Results (from the past 240 hour(s))  Culture, blood (Routine x 2)     Status: None (Preliminary result)   Collection Time: 03/25/17  3:06 PM  Result Value Ref Range Status   Specimen Description BLOOD LEFT ANTECUBITAL  Final   Special Requests   Final    BOTTLES DRAWN AEROBIC AND ANAEROBIC Blood Culture adequate volume   Culture NO GROWTH 3 DAYS  Final   Report Status PENDING  Incomplete  MRSA PCR Screening     Status: None   Collection Time: 03/25/17  9:47 PM  Result Value Ref Range Status   MRSA by PCR NEGATIVE NEGATIVE Final    Comment:        The GeneXpert MRSA Assay (FDA approved for NASAL specimens only), is one component of a comprehensive MRSA colonization surveillance program. It is not intended to diagnose MRSA infection nor to guide or monitor treatment for MRSA infections.     Radiology Reports Ir US Guide Vasc Access Right  Result Date: 03/28/2017 INDICATION: 36 year old female with a history of right leg cellulitis. EXAM: PICC LINE PLACEMENT WITH ULTRASOUND AND FLUOROSCOPIC GUIDANCE MEDICATIONS: None  ANESTHESIA/SEDATION: None . FLUOROSCOPY TIME:  Fluoroscopy Time: 0 minutes 12 seconds (6.3 mGy). COMPLICATIONS: None PROCEDURE: The patient was advised of the possible risks and complications and agreed  to undergo the procedure. The patient was then brought to the angiographic suite for the procedure. The right arm was prepped with chlorhexidine, draped in the usual sterile fashion using maximum barrier technique (cap and mask, sterile gown, sterile gloves, large sterile sheet, hand hygiene and cutaneous antisepsis) and infiltrated locally with 1% Lidocaine. Ultrasound demonstrated patency of the right basilic vein, and this was documented with an image. Under real-time ultrasound guidance, this vein was accessed with a 21 gauge micropuncture needle and image documentation was performed. A 0.018 wire was introduced in to the vein. Over this, a 5 French double-lumen lumen power injectable PICC was advanced to the lower SVC/right atrial junction. Fluoroscopy during the procedure and fluoro spot radiograph confirms appropriate catheter position. The catheter was flushed and covered with asterile dressing. Catheter length: 49 cm IMPRESSION: Status post right arm basilic vein double-lumen power injectable PICC measuring 49 cm. Catheter ready for use. Electronically Signed   By: Corrie Mckusick D.O.   On: 03/28/2017 09:14   Ir Fluoro Guide Cv Midline Picc Right  Result Date: 03/28/2017 INDICATION: 36 year old female with a history of right leg cellulitis. EXAM: PICC LINE PLACEMENT WITH ULTRASOUND AND FLUOROSCOPIC GUIDANCE MEDICATIONS: None ANESTHESIA/SEDATION: None . FLUOROSCOPY TIME:  Fluoroscopy Time: 0 minutes 12 seconds (6.3 mGy). COMPLICATIONS: None PROCEDURE: The patient was advised of the possible risks and complications and agreed to undergo the procedure. The patient was then brought to the angiographic suite for the procedure. The right arm was prepped with chlorhexidine, draped in the usual sterile fashion  using maximum barrier technique (cap and mask, sterile gown, sterile gloves, large sterile sheet, hand hygiene and cutaneous antisepsis) and infiltrated locally with 1% Lidocaine. Ultrasound demonstrated patency of the right basilic vein, and this was documented with an image. Under real-time ultrasound guidance, this vein was accessed with a 21 gauge micropuncture needle and image documentation was performed. A 0.018 wire was introduced in to the vein. Over this, a 5 French double-lumen lumen power injectable PICC was advanced to the lower SVC/right atrial junction. Fluoroscopy during the procedure and fluoro spot radiograph confirms appropriate catheter position. The catheter was flushed and covered with asterile dressing. Catheter length: 49 cm IMPRESSION: Status post right arm basilic vein double-lumen power injectable PICC measuring 49 cm. Catheter ready for use. Electronically Signed   By: Corrie Mckusick D.O.   On: 03/28/2017 09:14    Lab Data:  CBC:  Recent Labs Lab 03/25/17 1506 03/25/17 2109 03/26/17 0307  WBC 21.7* 13.6* 11.9*  NEUTROABS 19.9*  --   --   HGB 14.2 12.8 11.8*  HCT 45.4 41.2 38.3  MCV 87.5 86.9 88.0  PLT 229 171 688   Basic Metabolic Panel:  Recent Labs Lab 03/25/17 1506 03/25/17 2109 03/26/17 0307  NA 135  --  136  K 3.6  --  3.6  CL 94*  --  96*  CO2 28  --  31  GLUCOSE 170*  --  129*  BUN 13  --  12  CREATININE 1.22* 1.07* 1.08*  CALCIUM 9.6  --  8.4*   GFR: Estimated Creatinine Clearance: 128.2 mL/min (A) (by C-G formula based on SCr of 1.08 mg/dL (H)). Liver Function Tests:  Recent Labs Lab 03/25/17 1506  AST 27  ALT 15  ALKPHOS 73  BILITOT 1.6*  PROT 8.0  ALBUMIN 3.7   No results for input(s): LIPASE, AMYLASE in the last 168 hours. No results for input(s): AMMONIA in the last 168 hours. Coagulation Profile:  Recent Labs Lab 03/25/17 1506  INR 1.03   Cardiac Enzymes: No results for input(s): CKTOTAL, CKMB, CKMBINDEX, TROPONINI in  the last 168 hours. BNP (last 3 results)  Recent Labs  08/30/16 1413  PROBNP 641.0*   HbA1C: No results for input(s): HGBA1C in the last 72 hours. CBG:  Recent Labs Lab 03/27/17 0747 03/27/17 1211 03/27/17 1729 03/27/17 2050 03/28/17 0801  GLUCAP 135* 124* 134* 123* 96   Lipid Profile: No results for input(s): CHOL, HDL, LDLCALC, TRIG, CHOLHDL, LDLDIRECT in the last 72 hours. Thyroid Function Tests: No results for input(s): TSH, T4TOTAL, FREET4, T3FREE, THYROIDAB in the last 72 hours. Anemia Panel: No results for input(s): VITAMINB12, FOLATE, FERRITIN, TIBC, IRON, RETICCTPCT in the last 72 hours. Urine analysis:    Component Value Date/Time   COLORURINE YELLOW 03/27/2017 1942   APPEARANCEUR CLEAR 03/27/2017 1942   LABSPEC 1.009 03/27/2017 1942   PHURINE 6.0 03/27/2017 1942   GLUCOSEU 50 (A) 03/27/2017 1942   HGBUR SMALL (A) 03/27/2017 1942   BILIRUBINUR NEGATIVE 03/27/2017 1942   KETONESUR NEGATIVE 03/27/2017 1942   PROTEINUR 30 (A) 03/27/2017 1942   UROBILINOGEN 0.2 07/25/2014 1040   NITRITE NEGATIVE 03/27/2017 1942   LEUKOCYTESUR NEGATIVE 03/27/2017 1942     Alexandra Beck M.D. [facility] 03/28/2017, 9:55 AM  Pager: [phone] Between 7am to 7pm - call Pager - [phone]  After 7pm go to [email or web address] - password [email or web address]  Call night coverage person covering after 7pm

## 2017-03-28 NOTE — Procedures (Signed)
Interventional Radiology Procedure Note  Procedure: Placement of a right UE DL PICC, basilic vein.  Tip is positioned at the superior cavoatrial junction and catheter is ready for immediate use.  Complications: None Recommendations:  - Ok to use catheter - Do not submerge  - Routine line care   Signed,  Dulcy Fanny. Earleen Newport, DO

## 2017-03-29 DIAGNOSIS — Z8249 Family history of ischemic heart disease and other diseases of the circulatory system: Secondary | ICD-10-CM

## 2017-03-29 DIAGNOSIS — Z886 Allergy status to analgesic agent status: Secondary | ICD-10-CM

## 2017-03-29 DIAGNOSIS — Z87891 Personal history of nicotine dependence: Secondary | ICD-10-CM

## 2017-03-29 DIAGNOSIS — Z881 Allergy status to other antibiotic agents status: Secondary | ICD-10-CM

## 2017-03-29 DIAGNOSIS — G4733 Obstructive sleep apnea (adult) (pediatric): Secondary | ICD-10-CM

## 2017-03-29 DIAGNOSIS — E109 Type 1 diabetes mellitus without complications: Secondary | ICD-10-CM

## 2017-03-29 DIAGNOSIS — B9689 Other specified bacterial agents as the cause of diseases classified elsewhere: Secondary | ICD-10-CM

## 2017-03-29 DIAGNOSIS — Z888 Allergy status to other drugs, medicaments and biological substances status: Secondary | ICD-10-CM

## 2017-03-29 DIAGNOSIS — I509 Heart failure, unspecified: Secondary | ICD-10-CM

## 2017-03-29 LAB — BASIC METABOLIC PANEL
Anion gap: 10 (ref 5–15)
BUN: 10 mg/dL (ref 6–20)
CALCIUM: 8.8 mg/dL — AB (ref 8.9–10.3)
CO2: 32 mmol/L (ref 22–32)
Chloride: 95 mmol/L — ABNORMAL LOW (ref 101–111)
Creatinine, Ser: 1.01 mg/dL — ABNORMAL HIGH (ref 0.44–1.00)
GFR calc Af Amer: >60 mL/min (ref >60)
GLUCOSE: 126 mg/dL — AB (ref 65–99)
Potassium: 3.1 mmol/L — ABNORMAL LOW (ref 3.5–5.1)
Sodium: 137 mmol/L (ref 135–145)

## 2017-03-29 LAB — CBC
HCT: 39 % (ref 36.0–46.0)
HEMOGLOBIN: 12 g/dL (ref 12.0–15.0)
MCH: 27.2 pg (ref 26.0–34.0)
MCHC: 30.8 g/dL (ref 30.0–36.0)
MCV: 88.4 fL (ref 78.0–100.0)
Platelets: 193 10*3/uL (ref 150–400)
RBC: 4.41 MIL/uL (ref 3.87–5.11)
RDW: 17.4 % — AB (ref 11.5–15.5)
WBC: 6.8 10*3/uL (ref 4.0–10.5)

## 2017-03-29 LAB — GLUCOSE, CAPILLARY
GLUCOSE-CAPILLARY: 128 mg/dL — AB (ref 65–99)
GLUCOSE-CAPILLARY: 148 mg/dL — AB (ref 65–99)
GLUCOSE-CAPILLARY: 147 mg/dL — AB (ref 65–99)
Glucose-Capillary: 131 mg/dL — ABNORMAL HIGH (ref 65–99)

## 2017-03-29 LAB — HEMOGLOBIN A1C
HEMOGLOBIN A1C: 6.6 % — AB (ref 4.8–5.6)
MEAN PLASMA GLUCOSE: 143 mg/dL

## 2017-03-29 LAB — VANCOMYCIN, TROUGH: VANCOMYCIN TR: 37 ug/mL — AB (ref 15–20)

## 2017-03-29 MED ORDER — DEXTROSE 5 % IV SOLN
2.0000 g | INTRAVENOUS | Status: DC
  Administered 2017-03-29 – 2017-03-30 (×2): 2 g via INTRAVENOUS
  Filled 2017-03-29 (×4): qty 2

## 2017-03-29 MED ORDER — SODIUM CHLORIDE 0.9% FLUSH: 10.0000 mL | INTRAVENOUS | Status: DC | PRN

## 2017-03-29 MED ORDER — HYDROCODONE-ACETAMINOPHEN 5-325 MG PO TABS
1.0000 | ORAL_TABLET | ORAL | Status: DC | PRN
  Administered 2017-03-29: 2 via ORAL
  Filled 2017-03-29: qty 2

## 2017-03-29 MED ORDER — POTASSIUM CHLORIDE CRYS ER 20 MEQ PO TBCR
40.0000 meq | EXTENDED_RELEASE_TABLET | Freq: Once | ORAL | Status: AC
  Administered 2017-03-29: 40 meq via ORAL
  Filled 2017-03-29: qty 2

## 2017-03-29 NOTE — Progress Notes (Signed)
PROGRESS NOTE  Alexandra Beck  GQQ:[id number] DOB: 01-Sep-1981 DOA: 03/25/2017 PCP: Mauricio Po, FNP  Brief Narrative:   The patient is a 36 year old female with morbid obesity, chronic diastolic heart failure, well-controlled diabetes mellitus, hypothyroidism, obstructive sleep apnea, and recurrent right lower extremity cellulitis. She has had 4 hospitalizations for the same complaint, last hospitalization was in February of this year. She has been given courses of clindamycin, Keflex, and Augmentin.    Assessment & Plan:   Principal Problem:   Sepsis ([place]) Active Problems:   Essential hypertension   OSA (obstructive sleep apnea)   Diabetes mellitus type 2 in obese ([place])   Hypothyroidism  Sepsis due to right lower extremity cellulitis, recurrent.  Leukocytosis, hypotension, and fevers are improving.   -  PICC line placed on 5/1 due to difficult IV access -  Vancomycin level 37, so discontinued -  Continue zosyn, but could likely narrow spectrum -  ID consult -  Will repeat creatinine in AM -  No IVF and will continue diuretics for now  Chronic diastolic heart failure, weight fluctuates between 200 and 207 kg -  Continue Lasix 60 mg by mouth twice a day -  Given metolazone 5 mg on 5/1 -  Consider unna boot once infection improved  Essential hypertension, blood pressure stable -  Continue Cozaar, metoprolol  Hypokalemia -  Additional potassium supplementation today  Diabetes mellitus, hemoglobin A1c 6.6 - Continue sliding scale insulin  Hypothyroidism, stable, continue Synthroid 150 g daily  Obstructive sleep apnea, stable, continue CPAP  Hyperlipidemia, stable, continue statin  DVT prophylaxis:  Lovenox Code Status:  Full code Family Communication:  Patient alone Disposition Plan:  Monitoring of creatinine after elevated vancomycin level, ID consult for recurrent right lower extremity cellulitis, likely home in a few days.  Will defer to ID whether she needs oral  or IV antibiotics at discharge.  PICC line already placed.   Consultants:   ID - Dr. Johnnye Sima to consult  Procedures:  PICC line placed 5/1  Antimicrobials:  Anti-infectives    Start     Dose/Rate Route Frequency Ordered Stop   03/25/17 2300  vancomycin (VANCOCIN) 1,500 mg in sodium chloride 0.9 % 500 mL IVPB  Status:  Discontinued     1,500 mg 250 mL/hr over 120 Minutes Intravenous Every 8 hours 03/25/17 1628 03/29/17 1104   03/25/17 2200  piperacillin-tazobactam (ZOSYN) IVPB 3.375 g     3.375 g 12.5 mL/hr over 240 Minutes Intravenous Every 8 hours 03/25/17 1628     03/25/17 1515  piperacillin-tazobactam (ZOSYN) IVPB 3.375 g     3.375 g 100 mL/hr over 30 Minutes Intravenous  Once 03/25/17 1504 03/25/17 2011   03/25/17 1515  vancomycin (VANCOCIN) 2,500 mg in sodium chloride 0.9 % 500 mL IVPB     2,500 mg 250 mL/hr over 120 Minutes Intravenous  Once 03/25/17 1508 03/25/17 1848       Subjective:  Right lower extremity is much less painful and hot compared to yesterday or the day before. Still feels very tense and swollen. She is able to walk a little easier today. Denies systemic fevers or chills, nausea. She would like to see to an infectious disease doctor regarding her recurrent cellulitis.  Objective: Vitals:   03/28/17 0959 03/28/17 2057 03/29/17 0410 03/29/17 0900  BP: 124/79 136/73 115/71 (!) 148/86  Pulse: 87 93 90 84  Resp: 16 18 (!) 9 15  Temp: 98.7 F (37.1 C) 98.5 F (36.9 C) 98.7 F (37.1  C) 98.7 F (37.1 C)  TempSrc: Oral Oral Oral Oral  SpO2: 93% 92% 92% 92%  Weight:  (!) 207.3 kg (457 lb)    Height:        Intake/Output Summary (Last 24 hours) at 03/29/17 1151 Last data filed at 03/29/17 1002  Gross per 24 hour  Intake             2463 ml  Output                0 ml  Net             2463 ml   Filed Weights   03/26/17 0331 03/27/17 2220 03/28/17 2057  Weight: (!) 209 kg (460 lb 11.2 oz) (!) 206.8 kg (456 lb) (!) 207.3 kg (457 lb)     Examination:  General exam:  Morbidly obese female, No acute distress.  HEENT:  NCAT, MMM Respiratory system: Clear to auscultation bilaterally Cardiovascular system: Regular rate and rhythm, normal S1/S2. No murmurs, rubs, gallops or clicks.  Warm extremities Gastrointestinal system: Normal active bowel sounds, soft, nondistended, nontender. MSK:  Normal tone and bulk.  2+ pitting edema of the left lower extremity, right lower extremity is tensely swollen and warm to touch, erythema within the dotted lines that were previously drawn on her shin.  TTP.   Neuro:  Grossly intact PICC Right arm.    Data Reviewed: I have personally reviewed following labs and imaging studies  CBC:  Recent Labs Lab 03/25/17 1506 03/25/17 2109 03/26/17 0307 03/29/17 0624  WBC 21.7* 13.6* 11.9* 6.8  NEUTROABS 19.9*  --   --   --   HGB 14.2 12.8 11.8* 12.0  HCT 45.4 41.2 38.3 39.0  MCV 87.5 86.9 88.0 88.4  PLT 229 171 166 761   Basic Metabolic Panel:  Recent Labs Lab 03/25/17 1506 03/25/17 2109 03/26/17 0307 03/28/17 1248 03/29/17 0624  NA 135  --  136 138 137  K 3.6  --  3.6 3.7 3.1*  CL 94*  --  96* 98* 95*  CO2 28  --  31 31 32  GLUCOSE 170*  --  129* 136* 126*  BUN 13  --  12 8 10   CREATININE 1.22* 1.07* 1.08* 0.96 1.01*  CALCIUM 9.6  --  8.4* 9.2 8.8*   GFR: Estimated Creatinine Clearance: 137.4 mL/min (A) (by C-G formula based on SCr of 1.01 mg/dL (H)). Liver Function Tests:  Recent Labs Lab 03/25/17 1506  AST 27  ALT 15  ALKPHOS 73  BILITOT 1.6*  PROT 8.0  ALBUMIN 3.7   No results for input(s): LIPASE, AMYLASE in the last 168 hours. No results for input(s): AMMONIA in the last 168 hours. Coagulation Profile:  Recent Labs Lab 03/25/17 1506  INR 1.03   Cardiac Enzymes: No results for input(s): CKTOTAL, CKMB, CKMBINDEX, TROPONINI in the last 168 hours. BNP (last 3 results)  Recent Labs  08/30/16 1413  PROBNP 641.0*   HbA1C:  Recent Labs   03/28/17 0525  HGBA1C 6.6*   CBG:  Recent Labs Lab 03/28/17 1132 03/28/17 1736 03/28/17 2055 03/29/17 0814 03/29/17 1125  GLUCAP 175* 107* 110* 128* 148*   Lipid Profile: No results for input(s): CHOL, HDL, LDLCALC, TRIG, CHOLHDL, LDLDIRECT in the last 72 hours. Thyroid Function Tests: No results for input(s): TSH, T4TOTAL, FREET4, T3FREE, THYROIDAB in the last 72 hours. Anemia Panel: No results for input(s): VITAMINB12, FOLATE, FERRITIN, TIBC, IRON, RETICCTPCT in the last 72 hours. Urine analysis:  Component Value Date/Time   COLORURINE YELLOW 03/27/2017 1942   APPEARANCEUR CLEAR 03/27/2017 1942   LABSPEC 1.009 03/27/2017 1942   PHURINE 6.0 03/27/2017 1942   GLUCOSEU 50 (A) 03/27/2017 1942   HGBUR SMALL (A) 03/27/2017 1942   BILIRUBINUR NEGATIVE 03/27/2017 [place] NEGATIVE 03/27/2017 1942   PROTEINUR 30 (A) 03/27/2017 1942   UROBILINOGEN 0.2 07/25/2014 1040   NITRITE NEGATIVE 03/27/2017 1942   LEUKOCYTESUR NEGATIVE 03/27/2017 1942   Sepsis Labs: @LABRCNTIP (procalcitonin:4,lacticidven:4)  ) Recent Results (from the past 240 hour(s))  Culture, blood (Routine x 2)     Status: None (Preliminary result)   Collection Time: 03/25/17  3:06 PM  Result Value Ref Range Status   Specimen Description BLOOD LEFT ANTECUBITAL  Final   Special Requests   Final    BOTTLES DRAWN AEROBIC AND ANAEROBIC Blood Culture adequate volume   Culture NO GROWTH 4 DAYS  Final   Report Status PENDING  Incomplete  MRSA PCR Screening     Status: None   Collection Time: 03/25/17  9:47 PM  Result Value Ref Range Status   MRSA by PCR NEGATIVE NEGATIVE Final    Comment:        The GeneXpert MRSA Assay (FDA approved for NASAL specimens only), is one component of a comprehensive MRSA colonization surveillance program. It is not intended to diagnose MRSA infection nor to guide or monitor treatment for MRSA infections.       Radiology Studies: Ir US Guide Vasc Access  Right  Result Date: 03/28/2017 INDICATION: 36 year old female with a history of right leg cellulitis. EXAM: PICC LINE PLACEMENT WITH ULTRASOUND AND FLUOROSCOPIC GUIDANCE MEDICATIONS: None ANESTHESIA/SEDATION: None . FLUOROSCOPY TIME:  Fluoroscopy Time: 0 minutes 12 seconds (6.3 mGy). COMPLICATIONS: None PROCEDURE: The patient was advised of the possible risks and complications and agreed to undergo the procedure. The patient was then brought to the angiographic suite for the procedure. The right arm was prepped with chlorhexidine, draped in the usual sterile fashion using maximum barrier technique (cap and mask, sterile gown, sterile gloves, large sterile sheet, hand hygiene and cutaneous antisepsis) and infiltrated locally with 1% Lidocaine. Ultrasound demonstrated patency of the right basilic vein, and this was documented with an image. Under real-time ultrasound guidance, this vein was accessed with a 21 gauge micropuncture needle and image documentation was performed. A 0.018 wire was introduced in to the vein. Over this, a 5 French double-lumen lumen power injectable PICC was advanced to the lower SVC/right atrial junction. Fluoroscopy during the procedure and fluoro spot radiograph confirms appropriate catheter position. The catheter was flushed and covered with asterile dressing. Catheter length: 49 cm IMPRESSION: Status post right arm basilic vein double-lumen power injectable PICC measuring 49 cm. Catheter ready for use. Electronically Signed   By: Corrie Mckusick D.O.   On: 03/28/2017 09:14   Ir Fluoro Guide Cv Midline Picc Right  Result Date: 03/28/2017 INDICATION: 36 year old female with a history of right leg cellulitis. EXAM: PICC LINE PLACEMENT WITH ULTRASOUND AND FLUOROSCOPIC GUIDANCE MEDICATIONS: None ANESTHESIA/SEDATION: None . FLUOROSCOPY TIME:  Fluoroscopy Time: 0 minutes 12 seconds (6.3 mGy). COMPLICATIONS: None PROCEDURE: The patient was advised of the possible risks and complications and  agreed to undergo the procedure. The patient was then brought to the angiographic suite for the procedure. The right arm was prepped with chlorhexidine, draped in the usual sterile fashion using maximum barrier technique (cap and mask, sterile gown, sterile gloves, large sterile sheet, hand hygiene and cutaneous antisepsis) and infiltrated locally  with 1% Lidocaine. Ultrasound demonstrated patency of the right basilic vein, and this was documented with an image. Under real-time ultrasound guidance, this vein was accessed with a 21 gauge micropuncture needle and image documentation was performed. A 0.018 wire was introduced in to the vein. Over this, a 5 French double-lumen lumen power injectable PICC was advanced to the lower SVC/right atrial junction. Fluoroscopy during the procedure and fluoro spot radiograph confirms appropriate catheter position. The catheter was flushed and covered with asterile dressing. Catheter length: 49 cm IMPRESSION: Status post right arm basilic vein double-lumen power injectable PICC measuring 49 cm. Catheter ready for use. Electronically Signed   By: Corrie Mckusick D.O.   On: 03/28/2017 09:14     Scheduled Meds: . enoxaparin (LOVENOX) injection  100 mg Subcutaneous Q24H  . furosemide  60 mg Oral BID  . insulin aspart  0-20 Units Subcutaneous TID WC  . levothyroxine  150 mcg Oral QAC breakfast  . losartan  25 mg Oral Daily  . rosuvastatin  10 mg Oral QHS  . sodium chloride flush  3 mL Intravenous Q12H   Continuous Infusions: . piperacillin-tazobactam (ZOSYN)  IV 3.375 g (03/29/17 0559)     LOS: 3 days    Time spent: 30 min    Janece Canterbury, MD [facility] Pager [phone]  If 7PM-7AM, please contact night-coverage [email or web address] Password [email or web address] 03/29/2017, 11:51 AM

## 2017-03-29 NOTE — Consult Note (Signed)
[facility]  Date of Admission:  03/25/2017  Date of Consult:  03/29/2017  Reason for Consult:Cellulitis Referring Physician: Short  Impression/Recommendation Cellulitis Would change anbx to ceftriaxone Change to keflex 1g bid for 10 days at d/c.  Would give her po anbx as suppressive after she completes current course. (daily Pen G 410m daily or clinda [id number]) Encourage her to keep feet elevated Compression stockings Keeping legs clean and free of wounds.  Wt loss will be key  Obesity, Morbid  DM1 Good control will be essential in preventing further episodes.   Recheck HIV  Thank you so much for this interesting consult,   JeBobby Rumpfpager) [phone]  Alexandra Beck an 89.o. female.  HPI: [age]o F with hx of DM1, CHF,  morbid obesity adm on 4-28 with RLE cellulitis which progressed over 24h. States she had fever 102 at home. she has had previously, last episode 01-11-17. States she has been having these for last 8 years.  Was started on vanco/zosyn. WBC was 21.7. Lactate 4.65, normalized with with IVF.   Past Medical History:  Diagnosis Date  . Acute renal failure (ARF) (HCPastoria01/2014    multifactorial-likely secondary to ATN in the setting of sepsis and hypotension, and also from rhabdomyolysis  . Anemia   . Chronic diastolic CHF (congestive heart failure) (HCKansas  . Depression   . Diabetes mellitus ([place]CL[place]  . GERD (gastroesophageal reflux disease)   . Hyperlipidemia   . Hypertension   . Hypothyroidism   . Morbid obesity with BMI of 70 and over, adult ([place] [place]  . Obesity hypoventilation syndrome ([place]  . OSA (obstructive sleep apnea)   . PVC's (premature ventricular contractions)   . Recurrent cellulitis of lower leg    LLE, venous insuff  . Sepsis (HCJoshua Tree1/2014   Secondary to cellulitis  . Venous insufficiency of leg     Past Surgical History:  Procedure Laterality Date  . IR FLUORO GUIDE  CV MIDLINE PICC RIGHT  03/28/2017  . IR [place] VASC ACCESS RIGHT  03/28/2017  . WISDOM TOOTH EXTRACTION       Allergies  Allergen Reactions  . Aspirin Hives and Swelling    REACTION: throat swelling, hives  . Doxycycline Other (See Comments)    Abdominal pain  . Lisinopril Cough  . Niaspan [Niacin Er]     Caused flushing    Medications:  Scheduled: . enoxaparin (LOVENOX) injection  100 mg Subcutaneous Q24H  . furosemide  60 mg Oral BID  . insulin aspart  0-20 Units Subcutaneous TID WC  . levothyroxine  150 mcg Oral QAC breakfast  . losartan  25 mg Oral Daily  . rosuvastatin  10 mg Oral QHS  . sodium chloride flush  3 mL Intravenous Q12H    Abtx:  Anti-infectives    Start     Dose/Rate Route Frequency Ordered Stop   03/25/17 2300  vancomycin (VANCOCIN) 1,500 mg in sodium chloride 0.9 % 500 mL IVPB  Status:  Discontinued     1,500 mg 250 mL/hr over 120 Minutes Intravenous Every 8 hours 03/25/17 1628 03/29/17 1104   03/25/17 2200  piperacillin-tazobactam (ZOSYN) IVPB 3.375 g     3.375 g 12.5 mL/hr over 240 Minutes Intravenous Every 8 hours 03/25/17 1628     03/25/17 1515  piperacillin-tazobactam (ZOSYN) IVPB 3.375 g     3.375 g 100 mL/hr over 30 Minutes Intravenous  Once 03/25/17  1504 03/25/17 2011   03/25/17 1515  vancomycin (VANCOCIN) 2,500 mg in sodium chloride 0.9 % 500 mL IVPB     2,500 mg 250 mL/hr over 120 Minutes Intravenous  Once 03/25/17 1508 03/25/17 1848      Total days of antibiotics: 3 vanco/zosyn          Social History:  reports that she has quit smoking. She quit after 0.00 years of use. She has never used smokeless tobacco. She reports that she does not drink alcohol or use drugs.  Family History  Problem Relation Age of Onset  . Hypertension Mother   . Hypertension Father   . Heart disease Father     before age 15    General ROS: normal BM, normal urine. no sob, no cough.  Please see HPI. 12 point ROS o/w (-)  Blood pressure 138/83, pulse 74,  temperature 98.5 F (36.9 C), temperature source Oral, resp. rate 15, height '5\' 2"'  (1.575 m), weight (!) 207.3 kg (457 lb), last menstrual period 03/11/2017, SpO2 93 %. General appearance: alert, cooperative, no distress and morbidly obese Eyes: negative findings: conjunctivae and sclerae normal and pupils equal, round, reactive to light and accomodation Throat: lips, mucosa, and tongue normal; teeth and gums normal Neck: no adenopathy and supple, symmetrical, trachea midline Lungs: clear to auscultation bilaterally Heart: regular rate and rhythm Abdomen: normal findings: bowel sounds normal and soft, non-tender Extremities: erythema from ankle to knee. per pt decreased from prev. minimal tenderness.    Results for orders placed or performed during the hospital encounter of 03/25/17 (from the past 48 hour(s))  Glucose, capillary     Status: Abnormal   Collection Time: 03/27/17  5:29 PM  Result Value Ref Range   Glucose-Capillary 134 (H) 65 - 99 mg/dL  Urinalysis, Routine w reflex microscopic     Status: Abnormal   Collection Time: 03/27/17  7:42 PM  Result Value Ref Range   Color, Urine YELLOW YELLOW   APPearance CLEAR CLEAR   Specific Gravity, Urine 1.009 1.005 - 1.030   pH 6.0 5.0 - 8.0   Glucose, UA 50 (A) NEGATIVE mg/dL   Hgb urine dipstick SMALL (A) NEGATIVE   Bilirubin Urine NEGATIVE NEGATIVE   Ketones, ur NEGATIVE NEGATIVE mg/dL   Protein, ur 30 (A) NEGATIVE mg/dL   Nitrite NEGATIVE NEGATIVE   Leukocytes, UA NEGATIVE NEGATIVE   RBC / HPF 0-5 0 - 5 RBC/hpf   WBC, UA 0-5 0 - 5 WBC/hpf   Bacteria, UA NONE SEEN NONE SEEN   Squamous Epithelial / LPF 0-5 (A) NONE SEEN  Glucose, capillary     Status: Abnormal   Collection Time: 03/27/17  8:50 PM  Result Value Ref Range   Glucose-Capillary 123 (H) 65 - 99 mg/dL  Hemoglobin A1c     Status: Abnormal   Collection Time: 03/28/17  5:25 AM  Result Value Ref Range   Hgb A1c MFr Bld 6.6 (H) 4.8 - 5.6 %    Comment: (NOTE)          Pre-diabetes: 5.7 - 6.4         Diabetes: >6.4         Glycemic control for adults with diabetes: <7.0    Mean Plasma Glucose 143 mg/dL    Comment: (NOTE) Performed At: [facility] [place], [place] [id number] Lindon Romp MD AO:[id number]   Glucose, capillary     Status: None   Collection Time: 03/28/17  8:01 AM  Result  Value Ref Range   Glucose-Capillary 96 65 - 99 mg/dL  Glucose, capillary     Status: Abnormal   Collection Time: 03/28/17 11:32 AM  Result Value Ref Range   Glucose-Capillary 175 (H) 65 - 99 mg/dL  Basic metabolic panel     Status: Abnormal   Collection Time: 03/28/17 12:48 PM  Result Value Ref Range   Sodium 138 135 - 145 mmol/L   Potassium 3.7 3.5 - 5.1 mmol/L   Chloride 98 (L) 101 - 111 mmol/L   CO2 31 22 - 32 mmol/L   Glucose, Bld 136 (H) 65 - 99 mg/dL   BUN 8 6 - 20 mg/dL   Creatinine, Ser 0.96 0.44 - 1.00 mg/dL   Calcium 9.2 8.9 - 10.3 mg/dL   GFR calc non Af Amer >60 >60 mL/min   GFR calc Af Amer >60 >60 mL/min    Comment: (NOTE) The eGFR has been calculated using the CKD EPI equation. This calculation has not been validated in all clinical situations. eGFR's persistently <60 mL/min signify possible Chronic Kidney Disease.    Anion gap 9 5 - 15  Glucose, capillary     Status: Abnormal   Collection Time: 03/28/17  5:36 PM  Result Value Ref Range   Glucose-Capillary 107 (H) 65 - 99 mg/dL   Comment 1 Notify RN    Comment 2 Document in Chart   Glucose, capillary     Status: Abnormal   Collection Time: 03/28/17  8:55 PM  Result Value Ref Range   Glucose-Capillary 110 (H) 65 - 99 mg/dL  Basic metabolic panel     Status: Abnormal   Collection Time: 03/29/17  6:24 AM  Result Value Ref Range   Sodium 137 135 - 145 mmol/L   Potassium 3.1 (L) 3.5 - 5.1 mmol/L   Chloride 95 (L) 101 - 111 mmol/L   CO2 32 22 - 32 mmol/L   Glucose, Bld 126 (H) 65 - 99 mg/dL   BUN 10 6 - 20 mg/dL   Creatinine, Ser 1.01 (H) 0.44 - 1.00 mg/dL     Calcium 8.8 (L) 8.9 - 10.3 mg/dL   GFR calc non Af Amer >60 >60 mL/min   GFR calc Af Amer >60 >60 mL/min    Comment: (NOTE) The eGFR has been calculated using the CKD EPI equation. This calculation has not been validated in all clinical situations. eGFR's persistently <60 mL/min signify possible Chronic Kidney Disease.    Anion gap 10 5 - 15  CBC     Status: Abnormal   Collection Time: 03/29/17  6:24 AM  Result Value Ref Range   WBC 6.8 4.0 - 10.5 K/uL   RBC 4.41 3.87 - 5.11 MIL/uL   Hemoglobin 12.0 12.0 - 15.0 g/dL   HCT 39.0 36.0 - 46.0 %   MCV 88.4 78.0 - 100.0 fL   MCH 27.2 26.0 - 34.0 pg   MCHC 30.8 30.0 - 36.0 g/dL   RDW 17.4 (H) 11.5 - 15.5 %   Platelets 193 150 - 400 K/uL  Glucose, capillary     Status: Abnormal   Collection Time: 03/29/17  8:14 AM  Result Value Ref Range   Glucose-Capillary 128 (H) 65 - 99 mg/dL  Vancomycin, trough     Status: Abnormal   Collection Time: 03/29/17  9:49 AM  Result Value Ref Range   Vancomycin Tr 37 (HH) 15 - 20 ug/mL    Comment: CRITICAL RESULT CALLED TO, READ BACK BY AND VERIFIED WITH: K.  HOLDSON RN [id number] 03/29/17 LEONARDA    Glucose, capillary     Status: Abnormal   Collection Time: 03/29/17 11:25 AM  Result Value Ref Range   Glucose-Capillary 148 (H) 65 - 99 mg/dL      Component Value Date/Time   SDES BLOOD LEFT ANTECUBITAL 03/25/2017 1506   SPECREQUEST  03/25/2017 1506    BOTTLES DRAWN AEROBIC AND ANAEROBIC Blood Culture adequate volume   CULT NO GROWTH 4 DAYS 03/25/2017 1506   REPTSTATUS PENDING 03/25/2017 1506   Ir US Guide Vasc Access Right  Result Date: 03/28/2017 INDICATION: 36 year old female with a history of right leg cellulitis. EXAM: PICC LINE PLACEMENT WITH ULTRASOUND AND FLUOROSCOPIC GUIDANCE MEDICATIONS: None ANESTHESIA/SEDATION: None . FLUOROSCOPY TIME:  Fluoroscopy Time: 0 minutes 12 seconds (6.3 mGy). COMPLICATIONS: None PROCEDURE: The patient was advised of the possible risks and complications and agreed to  undergo the procedure. The patient was then brought to the angiographic suite for the procedure. The right arm was prepped with chlorhexidine, draped in the usual sterile fashion using maximum barrier technique (cap and mask, sterile gown, sterile gloves, large sterile sheet, hand hygiene and cutaneous antisepsis) and infiltrated locally with 1% Lidocaine. Ultrasound demonstrated patency of the right basilic vein, and this was documented with an image. Under real-time ultrasound guidance, this vein was accessed with a 21 gauge micropuncture needle and image documentation was performed. A 0.018 wire was introduced in to the vein. Over this, a 5 French double-lumen lumen power injectable PICC was advanced to the lower SVC/right atrial junction. Fluoroscopy during the procedure and fluoro spot radiograph confirms appropriate catheter position. The catheter was flushed and covered with asterile dressing. Catheter length: 49 cm IMPRESSION: Status post right arm basilic vein double-lumen power injectable PICC measuring 49 cm. Catheter ready for use. Electronically Signed   By: Corrie Mckusick D.O.   On: 03/28/2017 09:14   Ir Fluoro Guide Cv Midline Picc Right  Result Date: 03/28/2017 INDICATION: 36 year old female with a history of right leg cellulitis. EXAM: PICC LINE PLACEMENT WITH ULTRASOUND AND FLUOROSCOPIC GUIDANCE MEDICATIONS: None ANESTHESIA/SEDATION: None . FLUOROSCOPY TIME:  Fluoroscopy Time: 0 minutes 12 seconds (6.3 mGy). COMPLICATIONS: None PROCEDURE: The patient was advised of the possible risks and complications and agreed to undergo the procedure. The patient was then brought to the angiographic suite for the procedure. The right arm was prepped with chlorhexidine, draped in the usual sterile fashion using maximum barrier technique (cap and mask, sterile gown, sterile gloves, large sterile sheet, hand hygiene and cutaneous antisepsis) and infiltrated locally with 1% Lidocaine. Ultrasound demonstrated  patency of the right basilic vein, and this was documented with an image. Under real-time ultrasound guidance, this vein was accessed with a 21 gauge micropuncture needle and image documentation was performed. A 0.018 wire was introduced in to the vein. Over this, a 5 French double-lumen lumen power injectable PICC was advanced to the lower SVC/right atrial junction. Fluoroscopy during the procedure and fluoro spot radiograph confirms appropriate catheter position. The catheter was flushed and covered with asterile dressing. Catheter length: 49 cm IMPRESSION: Status post right arm basilic vein double-lumen power injectable PICC measuring 49 cm. Catheter ready for use. Electronically Signed   By: Corrie Mckusick D.O.   On: 03/28/2017 09:14   Recent Results (from the past 240 hour(s))  Culture, blood (Routine x 2)     Status: None (Preliminary result)   Collection Time: 03/25/17  3:06 PM  Result Value Ref Range Status   Specimen Description BLOOD LEFT  ANTECUBITAL  Final   Special Requests   Final    BOTTLES DRAWN AEROBIC AND ANAEROBIC Blood Culture adequate volume   Culture NO GROWTH 4 DAYS  Final   Report Status PENDING  Incomplete  MRSA PCR Screening     Status: None   Collection Time: 03/25/17  9:47 PM  Result Value Ref Range Status   MRSA by PCR NEGATIVE NEGATIVE Final    Comment:        The GeneXpert MRSA Assay (FDA approved for NASAL specimens only), is one component of a comprehensive MRSA colonization surveillance program. It is not intended to diagnose MRSA infection nor to guide or monitor treatment for MRSA infections.       03/29/2017, 2:27 PM     LOS: 3 days    Records and images were personally reviewed where available.

## 2017-03-29 NOTE — Progress Notes (Signed)
CRITICAL VALUE ALERT  Critical value received:  Vanc 37  Date of notification:  03/29/17  Time of notification:  10:30 am  Critical value read back:Yes.    Nurse who received alert:  Kathyrn Sheriff  MD notified (1st page):  Short   Time of first page:  10:40 am   MD notified (2nd page):  Time of second page:  Responding MD:  Short  Time MD responded:  25

## 2017-03-29 NOTE — Progress Notes (Signed)
Pharmacy Antibiotic Note  Alexandra Beck is a 36 y.o. female admitted on 03/25/2017 with cellulitis.  Pharmacy has been consulted for vancomycin dosing.   Patient is morbidly obese at 208kg.  Patient lost IV access and missed 4 doses of vancomycin and Zosyn from 4/29 PM though all of 4/30. PICC placed 5/1 and antibiotics resumed on same schedule.  A vancomycin trough drawn today was elevated at 28mcg/mL. RN Urban Gibson appropriately held vancomycin dose that was due this morning.  SCr has stayed relatively stable and around patient's baseline. Was 1.22 on admission, 1.01 today. nCrCl ~85-72mL/min.  Plan: - Hold vancomycin and redrawn vancomycin random on 5/3 at 1000 to assess 24h clearnace - Continue Zosyn 3.375g IV q8h - Monitor renal function, C&S and duration of treatment - Monitor vancomycin trough as needed  Height: 5\' 2"  (157.5 cm) Weight: (!) 457 lb (207.3 kg) IBW/kg (Calculated) : 50.1  Temp (24hrs), Avg:98.6 F (37 C), Min:98.5 F (36.9 C), Max:98.7 F (37.1 C)   Recent Labs Lab 03/25/17 1506 03/25/17 1528 03/25/17 2109 03/25/17 2115 03/26/17 0019 03/26/17 0307 03/28/17 1248 03/29/17 0624 03/29/17 0949  WBC 21.7*  --  13.6*  --   --  11.9*  --  6.8  --   CREATININE 1.22*  --  1.07*  --   --  1.08* 0.96 1.01*  --   LATICACIDVEN  --  4.65*  --  1.42 1.5 0.9  --   --   --   VANCOTROUGH  --   --   --   --   --   --   --   --  37*    Estimated Creatinine Clearance: 137.4 mL/min (A) (by C-G formula based on SCr of 1.01 mg/dL (H)).    Allergies  Allergen Reactions  . Aspirin Hives and Swelling    REACTION: throat swelling, hives  . Doxycycline Other (See Comments)    Abdominal pain  . Lisinopril Cough  . Niaspan [Niacin Er]     Caused flushing    Antimicrobials this admission: Vancomycin 4/28 >>  Zosyn 4/28 >>   Dose adjustments this admission: 5/2 VT = 37 on 1500mg  IV q8h  Microbiology results: 4/28 BCx: 1/2 ngtd 4/28 MRSA PCR: neg  Thank you for  allowing pharmacy to be a part of this patient's care.  Lark Langenfeld D. Cleora Karnik, PharmD, BCPS Clinical Pharmacist Pager: [phone] 03/29/2017 11:10 AM

## 2017-03-30 LAB — MAGNESIUM: Magnesium: 1.9 mg/dL (ref 1.7–2.4)

## 2017-03-30 LAB — CULTURE, BLOOD (ROUTINE X 2)
Culture: NO GROWTH
SPECIAL REQUESTS: ADEQUATE

## 2017-03-30 LAB — BASIC METABOLIC PANEL
Anion gap: 11 (ref 5–15)
BUN: 12 mg/dL (ref 6–20)
CHLORIDE: 94 mmol/L — AB (ref 101–111)
CO2: 32 mmol/L (ref 22–32)
CREATININE: 1 mg/dL (ref 0.44–1.00)
Calcium: 8.7 mg/dL — ABNORMAL LOW (ref 8.9–10.3)
GFR calc Af Amer: >60 mL/min (ref >60)
GFR calc non Af Amer: >60 mL/min (ref >60)
Glucose, Bld: 124 mg/dL — ABNORMAL HIGH (ref 65–99)
Potassium: 2.9 mmol/L — ABNORMAL LOW (ref 3.5–5.1)
SODIUM: 137 mmol/L (ref 135–145)

## 2017-03-30 LAB — CBC
HCT: 39.4 % (ref 36.0–46.0)
Hemoglobin: 11.9 g/dL — ABNORMAL LOW (ref 12.0–15.0)
MCH: 26.7 pg (ref 26.0–34.0)
MCHC: 30.2 g/dL (ref 30.0–36.0)
MCV: 88.5 fL (ref 78.0–100.0)
PLATELETS: 193 10*3/uL (ref 150–400)
RBC: 4.45 MIL/uL (ref 3.87–5.11)
RDW: 17.1 % — AB (ref 11.5–15.5)
WBC: 6.8 10*3/uL (ref 4.0–10.5)

## 2017-03-30 LAB — HIV ANTIBODY (ROUTINE TESTING W REFLEX): HIV SCREEN 4TH GENERATION: NONREACTIVE

## 2017-03-30 LAB — GLUCOSE, CAPILLARY
GLUCOSE-CAPILLARY: 172 mg/dL — AB (ref 65–99)
GLUCOSE-CAPILLARY: 118 mg/dL — AB (ref 65–99)
Glucose-Capillary: 141 mg/dL — ABNORMAL HIGH (ref 65–99)

## 2017-03-30 MED ORDER — CEPHALEXIN 500 MG PO CAPS: 1000.0000 mg | ORAL_CAPSULE | Freq: Two times a day (BID) | ORAL | 0 refills | Status: DC

## 2017-03-30 MED ORDER — TRAMADOL HCL 50 MG PO TABS: 50.0000 mg | ORAL_TABLET | Freq: Four times a day (QID) | ORAL | Status: DC | PRN

## 2017-03-30 MED ORDER — MAGNESIUM OXIDE 400 (241.3 MG) MG PO TABS
400.0000 mg | ORAL_TABLET | Freq: Two times a day (BID) | ORAL | Status: DC
  Administered 2017-03-30: 400 mg via ORAL
  Filled 2017-03-30: qty 1

## 2017-03-30 MED ORDER — CLINDAMYCIN HCL 300 MG PO CAPS: 300.0000 mg | ORAL_CAPSULE | Freq: Every day | ORAL | 0 refills | Status: DC

## 2017-03-30 MED ORDER — POTASSIUM CHLORIDE CRYS ER 20 MEQ PO TBCR
40.0000 meq | EXTENDED_RELEASE_TABLET | ORAL | Status: AC
  Administered 2017-03-30 (×3): 40 meq via ORAL
  Filled 2017-03-30 (×3): qty 2

## 2017-03-30 MED ORDER — TRAMADOL HCL 50 MG PO TABS: 50.0000 mg | ORAL_TABLET | Freq: Four times a day (QID) | ORAL | 0 refills | Status: DC | PRN

## 2017-03-30 NOTE — Progress Notes (Signed)
Patient discharged to home. Unna boot placed, PICC removed. AVS reviewed prescriptions provided. Patient instructed to make follow up appointments. Patient left floor via wheelchair with tech

## 2017-03-30 NOTE — Discharge Summary (Addendum)
Physician Discharge Summary  DESTYNIE TOOMEY KGM:[id number] DOB: 11/23/1981 DOA: 03/25/2017  PCP: Mauricio Po, FNP  Admit date: 03/25/2017 Discharge date: 03/30/2017  Admitted From: home  Disposition:  home  Recommendations for Outpatient Follow-up:  1. Follow up with PCP in 1-2 weeks 2. Consider continuing the unna boot if swelling persists 3. Please obtain BMP in one week to follow up creatinine and potassium  Home Health:  none  Equipment/Devices:  Haematologist RLE  Discharge Condition:  Stable, improved CODE STATUS:  full  Diet recommendation:   Low sodium   Brief/Interim Summary:  The patient is a 36 year old female with morbid obesity, chronic diastolic heart failure, well-controlled diabetes mellitus, hypothyroidism, obstructive sleep apnea, and recurrent right lower extremity cellulitis. She has had 4 hospitalizations for the same complaint, last hospitalization was in February of this year. She has been given courses of clindamycin, Keflex, and Augmentin.  She presented with sepsis and was started on vancomycin and Zosyn. Her antibiotics were narrowed to ceftriaxone. Because she had difficult IV access, a PICC line was placed. Her PICC line was removed at the time of discharge and she will continue an additional 10 days of Keflex. Infectious disease was consulted because of the recurrent nature of her right lower extremity cellulitis. They recommended weight loss, elevation, compression, and starting prophylactic antibiotics after completion of her keflex to reduce the risk of recurrence. An Unna boot was placed in the right lower extremity prior to discharge. She was advised to discuss the possibility of continuing her [place] with her primary care doctor and cardiologist.  Discharge Diagnoses:  Principal Problem:   Sepsis ([place]) Active Problems:   Essential hypertension   OSA (obstructive sleep apnea)   Diabetes mellitus type 2 in obese ([place])   Hypothyroidism  Sepsis due to  right lower extremity cellulitis, recurrent.  Leukocytosis, hypotension, and fevers are improving.   -  PICC line placed on 5/1 due to difficult IV access -  Vancomycin level 37, so discontinued.  Her creatinine remained stable despite high vancomycin trough -  zosyn narrowed to ceftriaxone -  She received 4 days of IV antibiotics  -  Additional 10 days of keflex 1g BID at discharge -  ID consulted for recurrent cellulitis and recommended prophylactic Pen G 400mg  daily or clindamycin 300mg  daily.  Gave her prescription for clindamycin x 1 month to start after she completes her keflex until she can get a prescription from her PCP or see ID in clinic -  HIV nonreactive  Chronic diastolic heart failure, weight fluctuates between 200 and 207 kg -  Continued Lasix 60 mg by mouth twice a day -  Given metolazone 5 mg on 5/1 -  Resumed home medications at discharge  Essential hypertension, blood pressure stable -  Continued Cozaar, metoprolol  Hypokalemia -  Additional potassium supplementation PO given during hospitalization -  Continue KCl 77meq daily and repeat potassium level in 1-2 week by PCP  Diabetes mellitus, hemoglobin A1c 6.6 - Sliding scale insulin  Hypothyroidism, stable, continued Synthroid 150 g daily  Obstructive sleep apnea, stable, continued CPAP  Hyperlipidemia, stable, continued statin  Discharge Instructions  Discharge Instructions    (HEART FAILURE PATIENTS) Call MD:  Anytime you have any of the following symptoms: 1) 3 pound weight gain in 24 hours or 5 pounds in 1 week 2) shortness of breath, with or without a dry hacking cough 3) swelling in the hands, feet or stomach 4) if you have to sleep on extra  pillows at night in order to breathe.    Complete by:  As directed    Call MD for:  difficulty breathing, headache or visual disturbances    Complete by:  As directed    Call MD for:  extreme fatigue    Complete by:  As directed    Call MD for:  hives     Complete by:  As directed    Call MD for:  persistant dizziness or light-headedness    Complete by:  As directed    Call MD for:  persistant nausea and vomiting    Complete by:  As directed    Call MD for:  severe uncontrolled pain    Complete by:  As directed    Call MD for:  temperature >100.4    Complete by:  As directed    Diet - low sodium heart healthy    Complete by:  As directed    Diet Carb Modified    Complete by:  As directed    Increase activity slowly    Complete by:  As directed        Medication List    TAKE these medications   cephALEXin 500 MG capsule Commonly known as:  KEFLEX Take 2 capsules (1,000 mg total) by mouth 2 (two) times daily.   clindamycin 300 MG capsule Commonly known as:  CLEOCIN Take 1 capsule (300 mg total) by mouth daily. Start after keflex completed   empagliflozin 10 MG Tabs tablet Commonly known as:  JARDIANCE Take 10 mg by mouth daily. Follow-up appt is due must make appt for future refills What changed:  See the new instructions.   furosemide 40 MG tablet Commonly known as:  LASIX Take 60 mg by mouth 2 (two) times daily.   levothyroxine 150 MCG tablet Commonly known as:  SYNTHROID, LEVOTHROID Take 1 tablet (150 mcg total) by mouth daily before breakfast. Follow-up appt is due must make appt for future refills What changed:  See the new instructions.   losartan 25 MG tablet Commonly known as:  COZAAR Take 25 mg by mouth daily.   metolazone 2.5 MG tablet Commonly known as:  ZAROXOLYN Take 1 tablet by mouth 1 time a week on Fridays before taking lasix What changed:  Another medication with the same name was removed. Continue taking this medication, and follow the directions you see here.   metoprolol succinate 50 MG 24 hr tablet Commonly known as:  TOPROL-XL Take 1 tablet (50 mg total) by mouth daily. Follow-up appt is due must make appt for future refills What changed:  See the new instructions.   potassium chloride SA  20 MEQ tablet Commonly known as:  K-DUR,KLOR-CON Take 2 tablets (40 mEq total) by mouth daily.   PREVIDENT 5000 BOOSTER PLUS 1.1 % Pste Generic drug:  Sodium Fluoride BRUSH ON TEETH BID FOR 2 MINS EACH TIME. EXPECTORATE AS MUCH AS POSSIBLE THEN DO NOT SWISH EAT OR DRINK FOR 30 MINS AFTER TREATMENT   rosuvastatin 10 MG tablet Commonly known as:  CRESTOR Take 1 tablet (10 mg total) by mouth daily.   traMADol 50 MG tablet Commonly known as:  ULTRAM Take 1-2 tablets (50-100 mg total) by mouth every 6 (six) hours as needed for moderate pain or severe pain.      Follow-up Information    Mauricio Po, FNP. Schedule an appointment as soon as possible for a visit in 1 week(s).   Specialty:  Family Medicine Contact information: [address]  [place] [address] [phone]        Bobby Rumpf, MD. Schedule an appointment as soon as possible for a visit in 1 month(s).   Specialty:  Infectious Diseases Contact information: [address] [place] [address] [phone]          Allergies  Allergen Reactions  . Aspirin Hives and Swelling    REACTION: throat swelling, hives  . Doxycycline Other (See Comments)    Abdominal pain  . Lisinopril Cough  . Niaspan [Niacin Er]     Caused flushing    Consultations: Dr. Johnnye Sima, infectious disease   Procedures/Studies: Ir US Guide Vasc Access Right  Result Date: 03/28/2017 INDICATION: 36 year old female with a history of right leg cellulitis. EXAM: PICC LINE PLACEMENT WITH ULTRASOUND AND FLUOROSCOPIC GUIDANCE MEDICATIONS: None ANESTHESIA/SEDATION: None . FLUOROSCOPY TIME:  Fluoroscopy Time: 0 minutes 12 seconds (6.3 mGy). COMPLICATIONS: None PROCEDURE: The patient was advised of the possible risks and complications and agreed to undergo the procedure. The patient was then brought to the angiographic suite for the procedure. The right arm was prepped with chlorhexidine, draped in the usual sterile fashion using  maximum barrier technique (cap and mask, sterile gown, sterile gloves, large sterile sheet, hand hygiene and cutaneous antisepsis) and infiltrated locally with 1% Lidocaine. Ultrasound demonstrated patency of the right basilic vein, and this was documented with an image. Under real-time ultrasound guidance, this vein was accessed with a 21 gauge micropuncture needle and image documentation was performed. A 0.018 wire was introduced in to the vein. Over this, a 5 French double-lumen lumen power injectable PICC was advanced to the lower SVC/right atrial junction. Fluoroscopy during the procedure and fluoro spot radiograph confirms appropriate catheter position. The catheter was flushed and covered with asterile dressing. Catheter length: 49 cm IMPRESSION: Status post right arm basilic vein double-lumen power injectable PICC measuring 49 cm. Catheter ready for use. Electronically Signed   By: Corrie Mckusick D.O.   On: 03/28/2017 09:14   Ir Fluoro Guide Cv Midline Picc Right  Result Date: 03/28/2017 INDICATION: 36 year old female with a history of right leg cellulitis. EXAM: PICC LINE PLACEMENT WITH ULTRASOUND AND FLUOROSCOPIC GUIDANCE MEDICATIONS: None ANESTHESIA/SEDATION: None . FLUOROSCOPY TIME:  Fluoroscopy Time: 0 minutes 12 seconds (6.3 mGy). COMPLICATIONS: None PROCEDURE: The patient was advised of the possible risks and complications and agreed to undergo the procedure. The patient was then brought to the angiographic suite for the procedure. The right arm was prepped with chlorhexidine, draped in the usual sterile fashion using maximum barrier technique (cap and mask, sterile gown, sterile gloves, large sterile sheet, hand hygiene and cutaneous antisepsis) and infiltrated locally with 1% Lidocaine. Ultrasound demonstrated patency of the right basilic vein, and this was documented with an image. Under real-time ultrasound guidance, this vein was accessed with a 21 gauge micropuncture needle and image  documentation was performed. A 0.018 wire was introduced in to the vein. Over this, a 5 French double-lumen lumen power injectable PICC was advanced to the lower SVC/right atrial junction. Fluoroscopy during the procedure and fluoro spot radiograph confirms appropriate catheter position. The catheter was flushed and covered with asterile dressing. Catheter length: 49 cm IMPRESSION: Status post right arm basilic vein double-lumen power injectable PICC measuring 49 cm. Catheter ready for use. Electronically Signed   By: Corrie Mckusick D.O.   On: 03/28/2017 09:14    Unna boot application on 5/3   Subjective: Patient states that she continues to have some pain and nodules  on the lateral aspect of her right leg. The warmth and erythema are improving. She is able to ambulate more easily.  Discharge Exam: Vitals:   03/30/17 0900 03/30/17 1300  BP: (!) 151/82 (!) 153/80  Pulse: 90 93  Resp: (!) 22 14  Temp: 98.6 F (37 C) 98.4 F (36.9 C)   Vitals:   03/29/17 2034 03/30/17 0502 03/30/17 0900 03/30/17 1300  BP: (!) 143/69 (!) 186/75 (!) 151/82 (!) 153/80  Pulse: 94 88 90 93  Resp: 18 20 (!) 22 14  Temp: 98.8 F (37.1 C) 98.2 F (36.8 C) 98.6 F (37 C) 98.4 F (36.9 C)  TempSrc: Oral Oral Oral Oral  SpO2: 94% 90% 90% 92%  Weight: (!) 205.9 kg (454 lb)     Height:        General exam:  Morbidly obese female, No acute distress.  HEENT:  NCAT, MMM Respiratory system: Clear to auscultation bilaterally Cardiovascular system: Regular rate and rhythm, normal S1/S2. No murmurs, rubs, gallops or clicks.  Warm extremities Gastrointestinal system: Normal active bowel sounds, soft, nondistended, nontender. MSK:  Normal tone and bulk.  2+ pitting edema of the left lower extremity, right lower extremity is less swollen and less warm to touch, erythema continues to receive. Neuro:  Grossly intact PICC Right arm during initial morning exam.  (To be removed at discharge)   The results of significant  diagnostics from this hospitalization (including imaging, microbiology, ancillary and laboratory) are listed below for reference.     Microbiology: Recent Results (from the past 240 hour(s))  Culture, blood (Routine x 2)     Status: None   Collection Time: 03/25/17  3:06 PM  Result Value Ref Range Status   Specimen Description BLOOD LEFT ANTECUBITAL  Final   Special Requests   Final    BOTTLES DRAWN AEROBIC AND ANAEROBIC Blood Culture adequate volume   Culture NO GROWTH 5 DAYS  Final   Report Status 03/30/2017 FINAL  Final  MRSA PCR Screening     Status: None   Collection Time: 03/25/17  9:47 PM  Result Value Ref Range Status   MRSA by PCR NEGATIVE NEGATIVE Final    Comment:        The GeneXpert MRSA Assay (FDA approved for NASAL specimens only), is one component of a comprehensive MRSA colonization surveillance program. It is not intended to diagnose MRSA infection nor to guide or monitor treatment for MRSA infections.      Labs: BNP (last 3 results) No results for input(s): BNP in the last 8760 hours. Basic Metabolic Panel:  Recent Labs Lab 03/25/17 1506 03/25/17 2109 03/26/17 0307 03/28/17 1248 03/29/17 0624 03/30/17 0451  NA 135  --  136 138 137 137  K 3.6  --  3.6 3.7 3.1* 2.9*  CL 94*  --  96* 98* 95* 94*  CO2 28  --  31 31 32 32  GLUCOSE 170*  --  129* 136* 126* 124*  BUN 13  --  12 8 10 12   CREATININE 1.22* 1.07* 1.08* 0.96 1.01* 1.00  CALCIUM 9.6  --  8.4* 9.2 8.8* 8.7*  MG  --   --   --   --   --  1.9   Liver Function Tests:  Recent Labs Lab 03/25/17 1506  AST 27  ALT 15  ALKPHOS 73  BILITOT 1.6*  PROT 8.0  ALBUMIN 3.7   No results for input(s): LIPASE, AMYLASE in the last 168 hours. No results for input(s):  AMMONIA in the last 168 hours. CBC:  Recent Labs Lab 03/25/17 1506 03/25/17 2109 03/26/17 0307 03/29/17 0624 03/30/17 0451  WBC 21.7* 13.6* 11.9* 6.8 6.8  NEUTROABS 19.9*  --   --   --   --   HGB 14.2 12.8 11.8* 12.0 11.9*   HCT 45.4 41.2 38.3 39.0 39.4  MCV 87.5 86.9 88.0 88.4 88.5  PLT 229 171 166 193 193   Cardiac Enzymes: No results for input(s): CKTOTAL, CKMB, CKMBINDEX, TROPONINI in the last 168 hours. BNP: Invalid input(s): POCBNP CBG:  Recent Labs Lab 03/29/17 1125 03/29/17 1623 03/29/17 2038 03/30/17 0802 03/30/17 1147  GLUCAP 148* 131* 147* 141* 172*   D-Dimer No results for input(s): DDIMER in the last 72 hours. Hgb A1c  Recent Labs  03/28/17 0525  HGBA1C 6.6*   Lipid Profile No results for input(s): CHOL, HDL, LDLCALC, TRIG, CHOLHDL, LDLDIRECT in the last 72 hours. Thyroid function studies No results for input(s): TSH, T4TOTAL, T3FREE, THYROIDAB in the last 72 hours.  Invalid input(s): FREET3 Anemia work up No results for input(s): VITAMINB12, FOLATE, FERRITIN, TIBC, IRON, RETICCTPCT in the last 72 hours. Urinalysis    Component Value Date/Time   COLORURINE YELLOW 03/27/2017 1942   APPEARANCEUR CLEAR 03/27/2017 1942   LABSPEC 1.009 03/27/2017 1942   PHURINE 6.0 03/27/2017 1942   GLUCOSEU 50 (A) 03/27/2017 1942   HGBUR SMALL (A) 03/27/2017 1942   BILIRUBINUR NEGATIVE 03/27/2017 1942   KETONESUR NEGATIVE 03/27/2017 1942   PROTEINUR 30 (A) 03/27/2017 1942   UROBILINOGEN 0.2 07/25/2014 1040   NITRITE NEGATIVE 03/27/2017 1942   LEUKOCYTESUR NEGATIVE 03/27/2017 1942   Sepsis Labs Invalid input(s): PROCALCITONIN,  WBC,  LACTICIDVEN   Time coordinating discharge: Over 30 minutes  SIGNED:   Janece Canterbury, MD  [facility] 03/30/2017, 3:29 PM Pager   If 7PM-7AM, please contact night-coverage [email or web address] Password [email or web address]

## 2017-03-30 NOTE — Discharge Instructions (Signed)
Cellulitis, Adult Cellulitis is a skin infection. The infected area is usually red and sore. This condition occurs most often in the arms and lower legs. It is very important to get treated for this condition. Follow these instructions at home:  Take over-the-counter and prescription medicines only as told by your doctor.  If you were prescribed an antibiotic medicine, take it as told by your doctor. Do not stop taking the antibiotic even if you start to feel better.  Drink enough fluid to keep your pee (urine) clear or pale yellow.  Do not touch or rub the infected area.  Raise (elevate) the infected area above the level of your heart while you are sitting or lying down.  Place warm or cold wet cloths (warm or cold compresses) on the infected area. Do this as told by your doctor.  Keep all follow-up visits as told by your doctor. This is important. These visits let your doctor make sure your infection is not getting worse. Contact a doctor if:  You have a fever.  Your symptoms do not get better after 1-2 days of treatment.  Your bone or joint under the infected area starts to hurt after the skin has healed.  Your infection comes back. This can happen in the same area or another area.  You have a swollen bump in the infected area.  You have new symptoms.  You feel ill and also have muscle aches and pains. Get help right away if:  Your symptoms get worse.  You feel very sleepy.  You throw up (vomit) or have watery poop (diarrhea) for a long time.  There are red streaks coming from the infected area.  Your red area gets larger.  Your red area turns darker. This information is not intended to replace advice given to you by your health care provider. Make sure you discuss any questions you have with your health care provider. Document Released: 05/02/2008 Document Revised: 04/21/2016 Document Reviewed: 09/23/2015 Elsevier Interactive Patient Education  2017 Elsevier  Inc.  

## 2017-03-30 NOTE — Progress Notes (Signed)
Offered Pt assistance with a bath. Pt stated she would do bath later. Tech gave all bath supplies to Pt.

## 2017-03-30 NOTE — Care Management Note (Signed)
Case Management Note  Patient Details  Name: Alexandra Beck MRN: [id number] Date of Birth: Nov 20, 1981  Subjective/Objective:                 Patient with order to DC to home, no CM consults, rders, or needs identified at this time.    Action/Plan:  DC to home.  Expected Discharge Date:  03/30/17               Expected Discharge Plan:  Home/Self Care  In-House Referral:     Discharge planning Services  CM Consult  Post Acute Care Choice:    Choice offered to:     DME Arranged:    DME Agency:     HH Arranged:    HH Agency:     Status of Service:  Completed, signed off  If discussed at [employer] of Stay Meetings, dates discussed:    Additional Comments:  Carles Collet, RN 03/30/2017, 3:43 PM

## 2017-03-30 NOTE — Progress Notes (Signed)
Orthopedic Tech Progress Note Patient Details:  Alexandra Beck 1981/01/12 [id number]  Ortho Devices Type of Ortho Device: Louretta Parma boot Ortho Device/Splint Location: rle Ortho Device/Splint Interventions: Application   [name] 03/30/2017, 1:07 PM

## 2017-03-31 ENCOUNTER — Telehealth (INDEPENDENT_AMBULATORY_CARE_PROVIDER_SITE_OTHER): Payer: Self-pay | Admitting: Radiology

## 2017-03-31 NOTE — Telephone Encounter (Signed)
Patient called and said she called yesterday and asked for an appointment with Sharol Given to get a new Unna.  Please call her [phone]. I called pt and advised since

## 2017-03-31 NOTE — Telephone Encounter (Signed)
Continuation of last note- I discussed with her, she was d/c yesterday and was put in an [employer].  She states she does not have this Unna on anymore.  She has removed it.  I have told her Dr Jess Barters schedule is double booked 2 patients per slot, and we cannot work her in this morning, as he also has surgery to get to.  I offered her next available appt, with [facility] Monday 1045am 04/03/17. Autumn advised.

## 2017-04-03 ENCOUNTER — Ambulatory Visit (INDEPENDENT_AMBULATORY_CARE_PROVIDER_SITE_OTHER): Payer: BC Managed Care – PPO | Admitting: Family

## 2017-04-11 ENCOUNTER — Encounter: Payer: Self-pay | Admitting: Family

## 2017-04-12 MED ORDER — PENICILLIN V POTASSIUM 500 MG PO TABS: 500.0000 mg | ORAL_TABLET | Freq: Every day | ORAL | 0 refills | Status: DC

## 2017-04-16 ENCOUNTER — Encounter (HOSPITAL_COMMUNITY): Payer: Self-pay | Admitting: Emergency Medicine

## 2017-04-16 ENCOUNTER — Inpatient Hospital Stay (HOSPITAL_COMMUNITY)
Admission: EM | Admit: 2017-04-16 | Discharge: 2017-04-21 | DRG: 872 | Disposition: A | Discharge status: Home / Self Care | Payer: BC Managed Care – PPO | Attending: Internal Medicine | Admitting: Internal Medicine

## 2017-04-16 DIAGNOSIS — L03116 Cellulitis of left lower limb: Secondary | ICD-10-CM | POA: Diagnosis present

## 2017-04-16 DIAGNOSIS — E1169 Type 2 diabetes mellitus with other specified complication: Secondary | ICD-10-CM | POA: Diagnosis present

## 2017-04-16 DIAGNOSIS — Z79899 Other long term (current) drug therapy: Secondary | ICD-10-CM

## 2017-04-16 DIAGNOSIS — L0390 Cellulitis, unspecified: Secondary | ICD-10-CM

## 2017-04-16 DIAGNOSIS — Z886 Allergy status to analgesic agent status: Secondary | ICD-10-CM

## 2017-04-16 DIAGNOSIS — E662 Morbid (severe) obesity with alveolar hypoventilation: Secondary | ICD-10-CM | POA: Diagnosis present

## 2017-04-16 DIAGNOSIS — T368X5A Adverse effect of other systemic antibiotics, initial encounter: Secondary | ICD-10-CM | POA: Diagnosis present

## 2017-04-16 DIAGNOSIS — I110 Hypertensive heart disease with heart failure: Secondary | ICD-10-CM | POA: Diagnosis present

## 2017-04-16 DIAGNOSIS — Z6841 Body Mass Index (BMI) 40.0 and over, adult: Secondary | ICD-10-CM

## 2017-04-16 DIAGNOSIS — Z95828 Presence of other vascular implants and grafts: Secondary | ICD-10-CM

## 2017-04-16 DIAGNOSIS — G4733 Obstructive sleep apnea (adult) (pediatric): Secondary | ICD-10-CM | POA: Diagnosis present

## 2017-04-16 DIAGNOSIS — Z888 Allergy status to other drugs, medicaments and biological substances status: Secondary | ICD-10-CM

## 2017-04-16 DIAGNOSIS — L03119 Cellulitis of unspecified part of limb: Secondary | ICD-10-CM | POA: Diagnosis present

## 2017-04-16 DIAGNOSIS — E119 Type 2 diabetes mellitus without complications: Secondary | ICD-10-CM | POA: Diagnosis present

## 2017-04-16 DIAGNOSIS — E785 Hyperlipidemia, unspecified: Secondary | ICD-10-CM | POA: Diagnosis present

## 2017-04-16 DIAGNOSIS — I872 Venous insufficiency (chronic) (peripheral): Secondary | ICD-10-CM | POA: Diagnosis present

## 2017-04-16 DIAGNOSIS — Z7984 Long term (current) use of oral hypoglycemic drugs: Secondary | ICD-10-CM

## 2017-04-16 DIAGNOSIS — A419 Sepsis, unspecified organism: Secondary | ICD-10-CM | POA: Diagnosis present

## 2017-04-16 DIAGNOSIS — E1159 Type 2 diabetes mellitus with other circulatory complications: Secondary | ICD-10-CM | POA: Diagnosis present

## 2017-04-16 DIAGNOSIS — K219 Gastro-esophageal reflux disease without esophagitis: Secondary | ICD-10-CM | POA: Diagnosis present

## 2017-04-16 DIAGNOSIS — E1122 Type 2 diabetes mellitus with diabetic chronic kidney disease: Secondary | ICD-10-CM | POA: Diagnosis present

## 2017-04-16 DIAGNOSIS — Z87891 Personal history of nicotine dependence: Secondary | ICD-10-CM

## 2017-04-16 DIAGNOSIS — Z881 Allergy status to other antibiotic agents status: Secondary | ICD-10-CM

## 2017-04-16 DIAGNOSIS — I10 Essential (primary) hypertension: Secondary | ICD-10-CM | POA: Diagnosis present

## 2017-04-16 DIAGNOSIS — Z792 Long term (current) use of antibiotics: Secondary | ICD-10-CM

## 2017-04-16 DIAGNOSIS — E039 Hypothyroidism, unspecified: Secondary | ICD-10-CM | POA: Diagnosis present

## 2017-04-16 DIAGNOSIS — R600 Localized edema: Secondary | ICD-10-CM | POA: Diagnosis present

## 2017-04-16 DIAGNOSIS — I152 Hypertension secondary to endocrine disorders: Secondary | ICD-10-CM | POA: Diagnosis present

## 2017-04-16 DIAGNOSIS — I5032 Chronic diastolic (congestive) heart failure: Secondary | ICD-10-CM | POA: Diagnosis present

## 2017-04-16 DIAGNOSIS — E669 Obesity, unspecified: Secondary | ICD-10-CM

## 2017-04-16 NOTE — ED Triage Notes (Signed)
Pt reports that the "cellitius is back on her left leg".  Pt has been on antibiotic but "it still came back."  Leg is swollen and hot to touch.  She took 650mg  of tylenol for her fever at 10:45pm however she still has a fever of 102.5.

## 2017-04-17 DIAGNOSIS — I110 Hypertensive heart disease with heart failure: Secondary | ICD-10-CM | POA: Diagnosis present

## 2017-04-17 DIAGNOSIS — I872 Venous insufficiency (chronic) (peripheral): Secondary | ICD-10-CM | POA: Diagnosis present

## 2017-04-17 DIAGNOSIS — E039 Hypothyroidism, unspecified: Secondary | ICD-10-CM | POA: Diagnosis present

## 2017-04-17 DIAGNOSIS — L03119 Cellulitis of unspecified part of limb: Secondary | ICD-10-CM | POA: Diagnosis not present

## 2017-04-17 DIAGNOSIS — E1169 Type 2 diabetes mellitus with other specified complication: Secondary | ICD-10-CM | POA: Diagnosis not present

## 2017-04-17 DIAGNOSIS — T368X5A Adverse effect of other systemic antibiotics, initial encounter: Secondary | ICD-10-CM | POA: Diagnosis present

## 2017-04-17 DIAGNOSIS — R600 Localized edema: Secondary | ICD-10-CM | POA: Diagnosis present

## 2017-04-17 DIAGNOSIS — E785 Hyperlipidemia, unspecified: Secondary | ICD-10-CM | POA: Diagnosis present

## 2017-04-17 DIAGNOSIS — Z95828 Presence of other vascular implants and grafts: Secondary | ICD-10-CM | POA: Diagnosis not present

## 2017-04-17 DIAGNOSIS — G4733 Obstructive sleep apnea (adult) (pediatric): Secondary | ICD-10-CM | POA: Diagnosis present

## 2017-04-17 DIAGNOSIS — Z881 Allergy status to other antibiotic agents status: Secondary | ICD-10-CM | POA: Diagnosis not present

## 2017-04-17 DIAGNOSIS — E119 Type 2 diabetes mellitus without complications: Secondary | ICD-10-CM | POA: Diagnosis present

## 2017-04-17 DIAGNOSIS — Z792 Long term (current) use of antibiotics: Secondary | ICD-10-CM | POA: Diagnosis not present

## 2017-04-17 DIAGNOSIS — I5032 Chronic diastolic (congestive) heart failure: Secondary | ICD-10-CM | POA: Diagnosis not present

## 2017-04-17 DIAGNOSIS — E669 Obesity, unspecified: Secondary | ICD-10-CM | POA: Diagnosis not present

## 2017-04-17 DIAGNOSIS — L03116 Cellulitis of left lower limb: Secondary | ICD-10-CM | POA: Diagnosis not present

## 2017-04-17 DIAGNOSIS — K219 Gastro-esophageal reflux disease without esophagitis: Secondary | ICD-10-CM | POA: Diagnosis present

## 2017-04-17 DIAGNOSIS — Z79899 Other long term (current) drug therapy: Secondary | ICD-10-CM | POA: Diagnosis not present

## 2017-04-17 DIAGNOSIS — I10 Essential (primary) hypertension: Secondary | ICD-10-CM | POA: Diagnosis not present

## 2017-04-17 DIAGNOSIS — Z6841 Body Mass Index (BMI) 40.0 and over, adult: Secondary | ICD-10-CM | POA: Diagnosis not present

## 2017-04-17 DIAGNOSIS — E662 Morbid (severe) obesity with alveolar hypoventilation: Secondary | ICD-10-CM | POA: Diagnosis not present

## 2017-04-17 DIAGNOSIS — Z886 Allergy status to analgesic agent status: Secondary | ICD-10-CM | POA: Diagnosis not present

## 2017-04-17 DIAGNOSIS — Z87891 Personal history of nicotine dependence: Secondary | ICD-10-CM | POA: Diagnosis not present

## 2017-04-17 DIAGNOSIS — Z888 Allergy status to other drugs, medicaments and biological substances status: Secondary | ICD-10-CM | POA: Diagnosis not present

## 2017-04-17 DIAGNOSIS — A419 Sepsis, unspecified organism: Secondary | ICD-10-CM | POA: Diagnosis present

## 2017-04-17 DIAGNOSIS — Z7984 Long term (current) use of oral hypoglycemic drugs: Secondary | ICD-10-CM | POA: Diagnosis not present

## 2017-04-17 LAB — COMPREHENSIVE METABOLIC PANEL
ALBUMIN: 3.6 g/dL (ref 3.5–5.0)
ALK PHOS: 70 U/L (ref 38–126)
ALT: 15 U/L (ref 14–54)
AST: 18 U/L (ref 15–41)
Anion gap: 11 (ref 5–15)
BUN: 15 mg/dL (ref 6–20)
CALCIUM: 9.4 mg/dL (ref 8.9–10.3)
CO2: 28 mmol/L (ref 22–32)
CREATININE: 1.16 mg/dL — AB (ref 0.44–1.00)
Chloride: 97 mmol/L — ABNORMAL LOW (ref 101–111)
GFR calc Af Amer: >60 mL/min (ref >60)
GFR calc non Af Amer: 60 mL/min — ABNORMAL LOW (ref >60)
GLUCOSE: 147 mg/dL — AB (ref 65–99)
Potassium: 4 mmol/L (ref 3.5–5.1)
SODIUM: 136 mmol/L (ref 135–145)
Total Bilirubin: 1.1 mg/dL (ref 0.3–1.2)
Total Protein: 8.1 g/dL (ref 6.5–8.1)

## 2017-04-17 LAB — I-STAT CG4 LACTIC ACID, ED
LACTIC ACID, VENOUS: 3.47 mmol/L — AB (ref 0.5–1.9)
Lactic Acid, Venous: 2.97 mmol/L (ref 0.5–1.9)

## 2017-04-17 LAB — URINALYSIS, ROUTINE W REFLEX MICROSCOPIC
BACTERIA UA: NONE SEEN
BILIRUBIN URINE: NEGATIVE
Glucose, UA: >=500 mg/dL — AB
Ketones, ur: NEGATIVE mg/dL
Leukocytes, UA: NEGATIVE
NITRITE: NEGATIVE
PH: 6 (ref 5.0–8.0)
Protein, ur: 30 mg/dL — AB
SPECIFIC GRAVITY, URINE: 1.01 (ref 1.005–1.030)

## 2017-04-17 LAB — CBC WITH DIFFERENTIAL/PLATELET
BASOS PCT: 0 %
Basophils Absolute: 0 10*3/uL (ref 0.0–0.1)
EOS ABS: 0.1 10*3/uL (ref 0.0–0.7)
Eosinophils Relative: 1 %
HEMATOCRIT: 45 % (ref 36.0–46.0)
Hemoglobin: 13.8 g/dL (ref 12.0–15.0)
Lymphocytes Relative: 7 %
Lymphs Abs: 0.8 10*3/uL (ref 0.7–4.0)
MCH: 27.5 pg (ref 26.0–34.0)
MCHC: 30.7 g/dL (ref 30.0–36.0)
MCV: 89.8 fL (ref 78.0–100.0)
MONO ABS: 0.7 10*3/uL (ref 0.1–1.0)
MONOS PCT: 7 %
Neutro Abs: 9.3 10*3/uL — ABNORMAL HIGH (ref 1.7–7.7)
Neutrophils Relative %: 85 %
Platelets: 177 10*3/uL (ref 150–400)
RBC: 5.01 MIL/uL (ref 3.87–5.11)
RDW: 17.9 % — AB (ref 11.5–15.5)
WBC: 10.9 10*3/uL — ABNORMAL HIGH (ref 4.0–10.5)

## 2017-04-17 LAB — BASIC METABOLIC PANEL
ANION GAP: 10 (ref 5–15)
BUN: 16 mg/dL (ref 6–20)
CALCIUM: 9.5 mg/dL (ref 8.9–10.3)
CO2: 30 mmol/L (ref 22–32)
Chloride: 96 mmol/L — ABNORMAL LOW (ref 101–111)
Creatinine, Ser: 1.17 mg/dL — ABNORMAL HIGH (ref 0.44–1.00)
GFR, EST NON AFRICAN AMERICAN: 59 mL/min — AB (ref >60)
GLUCOSE: 143 mg/dL — AB (ref 65–99)
POTASSIUM: 3.8 mmol/L (ref 3.5–5.1)
SODIUM: 136 mmol/L (ref 135–145)

## 2017-04-17 LAB — GLUCOSE, CAPILLARY
GLUCOSE-CAPILLARY: 115 mg/dL — AB (ref 65–99)
GLUCOSE-CAPILLARY: 109 mg/dL — AB (ref 65–99)
GLUCOSE-CAPILLARY: 88 mg/dL (ref 65–99)
Glucose-Capillary: 128 mg/dL — ABNORMAL HIGH (ref 65–99)

## 2017-04-17 LAB — CBC
HCT: 45.6 % (ref 36.0–46.0)
Hemoglobin: 13.7 g/dL (ref 12.0–15.0)
MCH: 27 pg (ref 26.0–34.0)
MCHC: 30 g/dL (ref 30.0–36.0)
MCV: 89.9 fL (ref 78.0–100.0)
PLATELETS: 173 10*3/uL (ref 150–400)
RBC: 5.07 MIL/uL (ref 3.87–5.11)
RDW: 18.1 % — AB (ref 11.5–15.5)
WBC: 12.6 10*3/uL — AB (ref 4.0–10.5)

## 2017-04-17 LAB — LACTIC ACID, PLASMA
LACTIC ACID, VENOUS: 1.8 mmol/L (ref 0.5–1.9)
Lactic Acid, Venous: 1.3 mmol/L (ref 0.5–1.9)

## 2017-04-17 MED ORDER — POTASSIUM CHLORIDE CRYS ER 20 MEQ PO TBCR
40.0000 meq | EXTENDED_RELEASE_TABLET | Freq: Every day | ORAL | Status: DC
  Administered 2017-04-17 – 2017-04-21 (×5): 40 meq via ORAL
  Filled 2017-04-17 (×5): qty 2

## 2017-04-17 MED ORDER — SODIUM CHLORIDE 0.9 % IV BOLUS (SEPSIS)
1000.0000 mL | Freq: Once | INTRAVENOUS | Status: AC
  Administered 2017-04-17: 1000 mL via INTRAVENOUS

## 2017-04-17 MED ORDER — IBUPROFEN 600 MG PO TABS
600.0000 mg | ORAL_TABLET | Freq: Four times a day (QID) | ORAL | Status: DC | PRN
  Administered 2017-04-17 – 2017-04-19 (×3): 600 mg via ORAL
  Filled 2017-04-17 (×3): qty 1

## 2017-04-17 MED ORDER — METOLAZONE 2.5 MG PO TABS
2.5000 mg | ORAL_TABLET | ORAL | Status: DC
  Administered 2017-04-21: 2.5 mg via ORAL
  Filled 2017-04-17: qty 1

## 2017-04-17 MED ORDER — ACETAMINOPHEN 325 MG PO TABS
650.0000 mg | ORAL_TABLET | Freq: Four times a day (QID) | ORAL | Status: DC | PRN
  Administered 2017-04-18: 650 mg via ORAL
  Filled 2017-04-17 (×3): qty 2

## 2017-04-17 MED ORDER — PIPERACILLIN-TAZOBACTAM 3.375 G IVPB 30 MIN
3.3750 g | Freq: Once | INTRAVENOUS | Status: AC
  Administered 2017-04-17: 3.375 g via INTRAVENOUS
  Filled 2017-04-17: qty 50

## 2017-04-17 MED ORDER — ROSUVASTATIN CALCIUM 10 MG PO TABS
10.0000 mg | ORAL_TABLET | Freq: Every day | ORAL | Status: DC
  Administered 2017-04-17 – 2017-04-21 (×5): 10 mg via ORAL
  Filled 2017-04-17 (×6): qty 1

## 2017-04-17 MED ORDER — VANCOMYCIN HCL 10 G IV SOLR
1250.0000 mg | Freq: Two times a day (BID) | INTRAVENOUS | Status: DC
  Administered 2017-04-17 (×2): 1250 mg via INTRAVENOUS
  Filled 2017-04-17 (×5): qty 1250

## 2017-04-17 MED ORDER — VANCOMYCIN HCL IN DEXTROSE 1-5 GM/200ML-% IV SOLN: 1000.0000 mg | Freq: Once | INTRAVENOUS | Status: DC

## 2017-04-17 MED ORDER — DEXTROSE 5 % IV SOLN
1.0000 g | Freq: Two times a day (BID) | INTRAVENOUS | Status: DC
  Administered 2017-04-17 – 2017-04-18 (×3): 1 g via INTRAVENOUS
  Filled 2017-04-17 (×3): qty 10

## 2017-04-17 MED ORDER — ENOXAPARIN SODIUM 100 MG/ML ~~LOC~~ SOLN
100.0000 mg | SUBCUTANEOUS | Status: DC
  Filled 2017-04-17: qty 1

## 2017-04-17 MED ORDER — METOPROLOL SUCCINATE ER 50 MG PO TB24
50.0000 mg | ORAL_TABLET | Freq: Every day | ORAL | Status: DC
  Administered 2017-04-17 – 2017-04-21 (×5): 50 mg via ORAL
  Filled 2017-04-17 (×5): qty 1

## 2017-04-17 MED ORDER — VANCOMYCIN HCL 10 G IV SOLR
2500.0000 mg | Freq: Once | INTRAVENOUS | Status: AC
  Administered 2017-04-17: 2500 mg via INTRAVENOUS
  Filled 2017-04-17: qty 2500

## 2017-04-17 MED ORDER — SODIUM CHLORIDE 0.9 % IV SOLN
INTRAVENOUS | Status: DC
  Administered 2017-04-17: 08:00:00 via INTRAVENOUS

## 2017-04-17 MED ORDER — SODIUM CHLORIDE 0.9 % IV BOLUS (SEPSIS): 500.0000 mL | Freq: Once | INTRAVENOUS | Status: DC

## 2017-04-17 MED ORDER — LEVOTHYROXINE SODIUM 150 MCG PO TABS
150.0000 ug | ORAL_TABLET | Freq: Every day | ORAL | Status: DC
  Administered 2017-04-17 – 2017-04-18 (×2): 150 ug via ORAL
  Filled 2017-04-17 (×2): qty 1
  Filled 2017-04-17: qty 2
  Filled 2017-04-17: qty 1
  Filled 2017-04-17: qty 2

## 2017-04-17 MED ORDER — IBUPROFEN 400 MG PO TABS: 600.0000 mg | ORAL_TABLET | Freq: Four times a day (QID) | ORAL | Status: DC | PRN

## 2017-04-17 MED ORDER — FUROSEMIDE 40 MG PO TABS
60.0000 mg | ORAL_TABLET | Freq: Two times a day (BID) | ORAL | Status: DC
  Administered 2017-04-17 – 2017-04-19 (×5): 60 mg via ORAL
  Filled 2017-04-17 (×5): qty 1

## 2017-04-17 MED ORDER — INSULIN ASPART 100 UNIT/ML ~~LOC~~ SOLN
0.0000 [IU] | Freq: Three times a day (TID) | SUBCUTANEOUS | Status: DC
Start: 2017-04-17 — End: 2017-04-21
  Administered 2017-04-18: 3 [IU] via SUBCUTANEOUS
  Administered 2017-04-19 – 2017-04-21 (×2): 2 [IU] via SUBCUTANEOUS

## 2017-04-17 MED ORDER — LOSARTAN POTASSIUM 25 MG PO TABS
25.0000 mg | ORAL_TABLET | Freq: Every day | ORAL | Status: DC
  Administered 2017-04-17 – 2017-04-21 (×5): 25 mg via ORAL
  Filled 2017-04-17 (×5): qty 1

## 2017-04-17 NOTE — Progress Notes (Signed)
Spoke with primary RN regarding PICC insertion.   Made her aware that Interventional Radiology inserts her PICCs because IV Team unable to thread them centrally.  Order to be changed for IR to insert PICC.

## 2017-04-17 NOTE — Progress Notes (Signed)
PROGRESS NOTE  Alexandra Beck  YIF:[id number] DOB: 05/15/1981 DOA: 04/16/2017 PCP: Golden Circle, FNP   Brief Narrative: Alexandra Beck is a 36 y.o. female with a history of morbid obesity, OSA, venous insufficiency, chronic HFpEF, DM (last HbA1c 6.6%), hypothyroidism, and recurrent cellulitis who presented with left leg redness, pain, and fever.   She had been discharged 5/3 for RLE cellulitis, her 4th hospitalization for cellulitis, treated with vancomycin and zosyn, narrowed to ceftriaxone and discharged on keflex. She completed this and started clindamycin which caused belching/upper abdominal discomfort, so was started on penicillin for prophylaxis 4 days prior to developing abrupt onset of left lower leg redness, swelling, tenderness, and fever to 103.337F . She took a tylenol and presented to the ED where she was febrile to 102.37F, tachycardic, with a leukocytosis to 10.6, lactate elevation to 2.97. IV fluids, vancomycin and zosyn started in ED, transitioned to vancomycin, ceftriaxone overnight and sepsis has improved.   Assessment & Plan: Principal Problem:   Sepsis, unspecified organism [facility]) Active Problems:   Essential hypertension   Recurrent cellulitis of lower leg   Obesity hypoventilation syndrome (HCC)   Diabetes mellitus type 2 in obese (HCC)   Chronic diastolic CHF (congestive heart failure) (HCC)   Cellulitis of left leg  Sepsis due to left lower extremity non-purulent cellulitis: Had fever, tachycardia, leukocytosis, lactate elevation at admission. Has recurrent cellulitis, and RLE still appears erythematous. HIV NR at recent hospitalization.  - Continue vanc/CTX pending ID recommendations. Pt with poor access, required PICC last hospitalization. This was ordered at admission.  - Blood cultures drawn, will monitor - Elevation, TED hose, consider unna's boot. Discussed in detail with patient and mother that recurrent infection primarily related to venous stasis, ultimately  due to morbid obesity.  Morbid obesity: BMI is 84.52 kg/m.  - Would strongly consider bariatric surgery, though pt is currently precontemplative.  - Nutrition consult  Chronic HFpEF: EDW between 200-207kg per last DC summary. Received IVF's for sepsis initially, will have to monitor closely.  - Continue lasix 60mg  BID, metolazone 2.5mg  weekly (Rx says Fridays) - Saline lock IVF  Essential hypertension: Chronic, stable - Continue losartan, metoprolol  NIDDM: HbA1c 6.6% - Hold empagliflozin - SSI  Hypothyroidism: Chronic, stable. TSH 1.310 in Jan 2018.  - Continue synthroid 150 g daily  Obstructive sleep apnea: Chronic, stable - Continue CPAP  Hyperlipidemia: Chronic, stable - Continue statin  DVT prophylaxis: Lovenox Code Status: Full Family Communication: Mother by phone Disposition Plan: Anticipate DC to home environment  Consultants:   ID  Procedures:   None  Antimicrobials:  Vancomycin 5/20  Zosyn 5/20  Ceftriaxone 5/21   Subjective: LLE pain is improving, still feels febrile, no chest pain, dyspnea, palpitations.   Objective: Vitals:   04/17/17 0245 04/17/17 0330 04/17/17 0427 04/17/17 1003  BP: 137/71 136/69  132/61  Pulse: (!) 110 (!) 106  72  Resp: 20 20    Temp:  (!) 103 F (39.4 C) (!) 100.5 F (38.1 C) 98.6 F (37 C)  TempSrc:  Oral Oral Oral  SpO2: (!) 88% 93%    Weight:  (!) 209.6 kg (462 lb 1.6 oz)    Height:  5\' 2"  (1.575 m)      Intake/Output Summary (Last 24 hours) at 04/17/17 1043 Last data filed at 04/17/17 0413  Gross per 24 hour  Intake             2150 ml  Output  0 ml  Net             2150 ml   Filed Weights   04/16/17 2344 04/17/17 0330  Weight: (!) 204.1 kg (450 lb) (!) 209.6 kg (462 lb 1.6 oz)    Examination: General exam: Pleasant, obese female in no distress  Respiratory system: Non-labored breathing room air. Clear to auscultation bilaterally.  Cardiovascular system: Regular rate and  rhythm. No murmur, rub, or gallop. UTD JVD. Gastrointestinal system: Abdomen obese, soft, non-tender, non-distended, with normoactive bowel sounds. No organomegaly or masses felt. Central nervous system: Alert and oriented. No focal neurological deficits. Extremities: LLE with tender, blanching erythema centered anteriorly in lower leg extending circumferentially limited above ankle and below knee. No fluctuance. RLE also with erythema without tenderness. No inguinal lymphadenopathy palpated.  Skin: As above. Psychiatry: Judgement and insight appear normal. Mood & affect appropriate.   Data Reviewed: I have personally reviewed following labs and imaging studies  CBC:  Recent Labs Lab 04/16/17 2351 04/17/17 0113  WBC 10.9* 12.6*  NEUTROABS 9.3*  --   HGB 13.8 13.7  HCT 45.0 45.6  MCV 89.8 89.9  PLT 177 426   Basic Metabolic Panel:  Recent Labs Lab 04/16/17 2351 04/17/17 0113  NA 136 136  K 4.0 3.8  CL 97* 96*  CO2 28 30  GLUCOSE 147* 143*  BUN 15 16  CREATININE 1.16* 1.17*  CALCIUM 9.4 9.5   GFR: Estimated Creatinine Clearance: 119.5 mL/min (A) (by C-G formula based on SCr of 1.17 mg/dL (H)). Liver Function Tests:  Recent Labs Lab 04/16/17 2351  AST 18  ALT 15  ALKPHOS 70  BILITOT 1.1  PROT 8.1  ALBUMIN 3.6   No results for input(s): LIPASE, AMYLASE in the last 168 hours. No results for input(s): AMMONIA in the last 168 hours. Coagulation Profile: No results for input(s): INR, PROTIME in the last 168 hours. Cardiac Enzymes: No results for input(s): CKTOTAL, CKMB, CKMBINDEX, TROPONINI in the last 168 hours. BNP (last 3 results)  Recent Labs  08/30/16 1413  PROBNP 641.0*   HbA1C: No results for input(s): HGBA1C in the last 72 hours. CBG:  Recent Labs Lab 04/17/17 0759  GLUCAP 128*   Lipid Profile: No results for input(s): CHOL, HDL, LDLCALC, TRIG, CHOLHDL, LDLDIRECT in the last 72 hours. Thyroid Function Tests: No results for input(s): TSH,  T4TOTAL, FREET4, T3FREE, THYROIDAB in the last 72 hours. Anemia Panel: No results for input(s): VITAMINB12, FOLATE, FERRITIN, TIBC, IRON, RETICCTPCT in the last 72 hours. Urine analysis:    Component Value Date/Time   COLORURINE STRAW (A) 04/16/2017 0003   APPEARANCEUR CLEAR 04/16/2017 0003   LABSPEC 1.010 04/16/2017 0003   PHURINE 6.0 04/16/2017 0003   GLUCOSEU >=500 (A) 04/16/2017 0003   HGBUR SMALL (A) 04/16/2017 0003   BILIRUBINUR NEGATIVE 04/16/2017 0003   KETONESUR NEGATIVE 04/16/2017 0003   PROTEINUR 30 (A) 04/16/2017 0003   UROBILINOGEN 0.2 07/25/2014 1040   NITRITE NEGATIVE 04/16/2017 0003   LEUKOCYTESUR NEGATIVE 04/16/2017 0003   No results found for this or any previous visit (from the past 240 hour(s)).    Radiology Studies: No results found.  Scheduled Meds: . enoxaparin (LOVENOX) injection  100 mg Subcutaneous Q24H  . insulin aspart  0-15 Units Subcutaneous TID WC  . levothyroxine  150 mcg Oral QAC breakfast  . rosuvastatin  10 mg Oral Daily   Continuous Infusions: . cefTRIAXone (ROCEPHIN)  IV Stopped (04/17/17 0810)  . vancomycin  LOS: 0 days   Time spent: 25 minutes.  Vance Gather, MD [facility] Pager [phone]  If 7PM-7AM, please contact night-coverage [email or web address] Password [facility] 04/17/2017, 10:43 AM

## 2017-04-17 NOTE — ED Notes (Signed)
Dr. Alcario Drought aware of pt temperature.

## 2017-04-17 NOTE — ED Notes (Signed)
Alcario Drought, MD notified of lactic acid of 3.47

## 2017-04-17 NOTE — ED Provider Notes (Signed)
[facility] DEPT Provider Note   CSN: [id number] Arrival date & time: 04/16/17  2329     History   Chief Complaint Chief Complaint  Patient presents with  . Leg Injury  . Headache    HPI Alexandra Beck is a 36 y.o. female.  HPI  This is a 36 year old female with a history of recurrent cellulitis, hyperlipidemia, hypertension, obesity who presents with fever and left lower extremity redness. Patient states that she's had recurrent cellulitis. She was just discharged from hospital and finished a course of antibiotics. On Tuesday she was due to start suppressive antibiotics. She had an adverse reaction to clindamycin so has been on penicillin G since Tuesday. She states that today she noted chills and warmth to the left leg. She states she has had cellulitis in both legs previously but more current and the right leg. However, today is her left leg. She denies any cough or urinary symptoms or other infectious. She took her temperature at home was 103.6. She took Tylenol at 19 PM for this.  I reviewed the patient's chart. She was discharged from the hospital on May 3. She did have an infectious disease consult while in the hospital. They recommended transition from vancomycin to Rocephin and suppressive antibiotics with clindamycin and penicillin G.  Past Medical History:  Diagnosis Date  . Acute renal failure (ARF) ([place]) 11/2012    multifactorial-likely secondary to ATN in the setting of sepsis and hypotension, and also from rhabdomyolysis  . Anemia   . Chronic diastolic CHF (congestive heart failure) ([place])   . Depression   . Diabetes mellitus ([place])   . GERD (gastroesophageal reflux disease)   . Hyperlipidemia   . Hypertension   . Hypothyroidism   . Morbid obesity with BMI of 70 and over, adult ([place])   . Obesity hypoventilation syndrome ([place])   . OSA (obstructive sleep apnea)   . PVC's (premature ventricular contractions)   . Recurrent cellulitis of lower leg    LLE, venous  insuff  . Sepsis ([place]) 11/2012   Secondary to cellulitis  . Venous insufficiency of leg     Patient Active Problem List   Diagnosis Date Noted  . Cellulitis of right leg 01/11/2017  . Abnormal EKG 12/16/2016  . PVC's (premature ventricular contractions) 12/02/2016  . Chronic diastolic CHF (congestive heart failure) ([place])   . Sepsis ([place]) 08/26/2016  . Acute kidney injury ([place])   . Leg edema, left 06/10/2015  . Sepsis, unspecified organism ([place]) 07/23/2014  . Hyperlipidemia   . Diabetes mellitus type 2 in obese ([place])   . Hypothyroidism   . GERD (gastroesophageal reflux disease)   . Depression   . Venous insufficiency of leg   . Recurrent cellulitis of lower leg 12/25/2012  . OSA (obstructive sleep apnea) 12/25/2012  . Obesity hypoventilation syndrome ([place]) 12/25/2012  . Morbid obesity with BMI of 70 and over, adult ([place]) 12/24/2012  . Essential hypertension 06/26/2007    Past Surgical History:  Procedure Laterality Date  . IR FLUORO GUIDE CV MIDLINE PICC RIGHT  03/28/2017  . IR US GUIDE VASC ACCESS RIGHT  03/28/2017  . WISDOM TOOTH EXTRACTION      OB History    No data available       Home Medications    Prior to Admission medications   Medication Sig Start Date End Date Taking? Authorizing Provider  empagliflozin (JARDIANCE) 10 MG TABS tablet Take 10 mg by mouth daily. Follow-up appt is due must make appt for  future refills 03/29/17   Golden Circle, FNP  furosemide (LASIX) 40 MG tablet Take 60 mg by mouth 2 (two) times daily.    [provider]  levothyroxine (SYNTHROID, LEVOTHROID) 150 MCG tablet Take 1 tablet (150 mcg total) by mouth daily before breakfast. Follow-up appt is due must make appt for future refills 03/29/17   Golden Circle, FNP  losartan (COZAAR) 25 MG tablet Take 25 mg by mouth daily.    [provider]  metolazone (ZAROXOLYN) 2.5 MG tablet Take 1 tablet by mouth 1 time a week on Fridays before taking lasix 12/20/16   Dunn, Dayna N,  PA-C  metoprolol succinate (TOPROL-XL) 50 MG 24 hr tablet Take 1 tablet (50 mg total) by mouth daily. Follow-up appt is due must make appt for future refills 03/29/17   Golden Circle, FNP  penicillin v potassium (VEETID) 500 MG tablet Take 1 tablet (500 mg total) by mouth daily. 04/12/17   Golden Circle, FNP  potassium chloride SA (K-DUR,KLOR-CON) 20 MEQ tablet Take 2 tablets (40 mEq total) by mouth daily. 01/06/17   Dunn, Dayna N, PA-C  PREVIDENT 5000 BOOSTER PLUS 1.1 % PSTE BRUSH ON TEETH BID FOR 2 MINS EACH TIME. EXPECTORATE AS MUCH AS POSSIBLE THEN DO NOT SWISH EAT OR DRINK FOR 30 MINS AFTER TREATMENT 06/24/16   [provider]  rosuvastatin (CRESTOR) 10 MG tablet Take 1 tablet (10 mg total) by mouth daily. 10/25/16   Golden Circle, FNP  traMADol (ULTRAM) 50 MG tablet Take 1-2 tablets (50-100 mg total) by mouth every 6 (six) hours as needed for moderate pain or severe pain. 03/30/17   Janece Canterbury, MD    Family History Family History  Problem Relation Age of Onset  . Hypertension Mother   . Hypertension Father   . Heart disease Father        before age 52    Social History Social History  Substance Use Topics  . Smoking status: Former Smoker    Years: 0.00  . Smokeless tobacco: Never Used  . Alcohol use No     Allergies   Aspirin; Doxycycline; Lisinopril; and Niaspan [niacin er]   Review of Systems Review of Systems  Constitutional: Positive for chills and fever.  Respiratory: Negative for cough and shortness of breath.   Cardiovascular: Negative for chest pain.  Gastrointestinal: Negative for abdominal pain.  Musculoskeletal:       Left leg pain  Skin: Positive for color change.  All other systems reviewed and are negative.    Physical Exam Updated Vital Signs BP (!) 179/84   Pulse (!) 106   Temp (!) 102.5 F (39.2 C) (Oral)   Resp 18   Ht 5\' 2"  (1.575 m)   Wt (!) 450 lb (204.1 kg)   SpO2 100%   BMI 82.31 kg/m   Physical Exam    Constitutional: She is oriented to person, place, and time. She appears well-developed and well-nourished.  Morbidly obese  HENT:  Head: Normocephalic and atraumatic.  Cardiovascular: Regular rhythm and normal heart sounds.   Tachycardia  Pulmonary/Chest: Effort normal and breath sounds normal. No respiratory distress. She has no wheezes.  Abdominal: Soft. Bowel sounds are normal. There is no tenderness.  Musculoskeletal:  Warmth and erythema noted to the left lower extremity over the shin and calf, no tenderness palpation of the calf, mild erythema noted over the shin of the right lower extremity  Neurological: She is alert and oriented to person, place,  and time.  Skin: Skin is warm and dry.  Psychiatric: She has a normal mood and affect.  Nursing note and vitals reviewed.    ED Treatments / Results  Labs (all labs ordered are listed, but only abnormal results are displayed) Labs Reviewed  COMPREHENSIVE METABOLIC PANEL - Abnormal; Notable for the following:       Result Value   Chloride 97 (*)    Glucose, Bld 147 (*)    Creatinine, Ser 1.16 (*)    GFR calc non Af Amer 60 (*)    All other components within normal limits  CBC WITH DIFFERENTIAL/PLATELET - Abnormal; Notable for the following:    WBC 10.9 (*)    RDW 17.9 (*)    Neutro Abs 9.3 (*)    All other components within normal limits  URINALYSIS, ROUTINE W REFLEX MICROSCOPIC - Abnormal; Notable for the following:    Color, Urine STRAW (*)    Glucose, UA >=500 (*)    Hgb urine dipstick SMALL (*)    Protein, ur 30 (*)    Squamous Epithelial / LPF 0-5 (*)    All other components within normal limits  I-STAT CG4 LACTIC ACID, ED - Abnormal; Notable for the following:    Lactic Acid, Venous 2.97 (*)    All other components within normal limits  CULTURE, BLOOD (ROUTINE X 2)  CULTURE, BLOOD (ROUTINE X 2)  CBG MONITORING, ED    EKG  EKG Interpretation None       Radiology No results  found.  Procedures Procedures (including critical care time)  Medications Ordered in ED Medications  piperacillin-tazobactam (ZOSYN) IVPB 3.375 g (3.375 g Intravenous New Bag/Given 04/17/17 0109)  vancomycin (VANCOCIN) 2,500 mg in sodium chloride 0.9 % 500 mL IVPB (2,500 mg Intravenous New Bag/Given 04/17/17 0109)  sodium chloride 0.9 % bolus 1,000 mL (1,000 mLs Intravenous New Bag/Given 04/17/17 0109)     Initial Impression / Assessment and Plan / ED Course  I have reviewed the triage vital signs and the nursing notes.  Pertinent labs & imaging results that were available during my care of the patient were reviewed by me and considered in my medical decision making (see chart for details).     Patient presents with fevers and left lower extremity redness and erythema. History and physical exam suggestive of recurrent cellulitis. She is currently on suppressive penicillin. Temperature here 102.5. Lactate mildly elevated. Patient given fluids. Sepsis workup initiated. White count trending back upwards to 10.9. She also has mild acute kidney injury suggesting sepsis.  Patient was given vancomycin and Zosyn. I have reviewed her prior chart. Previously infectious disease had recommended Rocephin; however, given that she appears to be failing outpatient penicillin do not feel that Rocephin is broad enough at this time. Feel she will need repeat admission to the hospital given failure of outpatient antibiotics.  Final Clinical Impressions(s) / ED Diagnoses   Final diagnoses:  Cellulitis of left lower extremity  Sepsis, due to unspecified organism [facility])    New Prescriptions New Prescriptions   No medications on file     Merryl Hacker, MD 04/17/17 0110

## 2017-04-17 NOTE — Progress Notes (Signed)
Pt may need picc line, has had them in the past and has trouble with peripheral IV's, Thanks [facility] [place].

## 2017-04-17 NOTE — Progress Notes (Addendum)
Will bolus another 1L NS now.  Repeat lactate at 0600.  Will do serial NS 1L boluses to avoid fluid overload.

## 2017-04-17 NOTE — Progress Notes (Signed)
Pharmacy Antibiotic Note  MONIKE BRAGDON is a 36 y.o. female admitted on 04/16/2017 with cellulitis.  Pharmacy has been consulted for Vancomycin and Rocephin dosing.  During last admission, Vanc trough 37 mcg/ml on 1500mg  IV q8h with similar SCr as current.   Vancomycin 2500mg  IV load and Zosyn 3.375gm given in ED  Plan: Rocephin 1gm IV q24h Vancomycin 1250mg  IV q12h Will f/u micro data, renal function, and pt's clinical condition Vanc trough at C[facility]   Height: 5\' 2"  (157.5 cm) Weight: (!) 450 lb (204.1 kg) IBW/kg (Calculated) : 50.1  Temp (24hrs), Avg:102.5 F (39.2 C), Min:102.4 F (39.1 C), Max:102.5 F (39.2 C)   Recent Labs Lab 04/16/17 2351 04/17/17 0014  WBC 10.9*  --   CREATININE 1.16*  --   LATICACIDVEN  --  2.97*    Estimated Creatinine Clearance: 118.2 mL/min (A) (by C-G formula based on SCr of 1.16 mg/dL (H)).    Allergies  Allergen Reactions  . Aspirin Hives and Swelling    REACTION: throat swelling, hives  . Doxycycline Other (See Comments)    Abdominal pain  . Lisinopril Cough  . Niaspan [Niacin Er]     Caused flushing    Antimicrobials this admission: 5/20 Zosyn x 1 5/20 Vanc >>  5/20 Rocephin >>   Dose adjustments this admission: n/a  Microbiology results: 5/21 BCx x2:   Thank you for allowing pharmacy to be a part of this patient's care.  Sherlon Handing, PharmD, BCPS Clinical pharmacist, pager [phone] 04/17/2017 1:34 AM

## 2017-04-17 NOTE — H&P (Signed)
History and Physical    Alexandra Beck:[id number] DOB: Apr 04, 1981 DOA: 04/16/2017  PCP: Golden Circle, FNP  Patient coming from: Home  I have personally briefly reviewed patient's old medical records in [place]  Chief Complaint: L leg cellulitis  HPI: Alexandra Beck is a 36 y.o. female with medical history significant of recurrent cellulitis of BLE, DM, HTN.  Patient just discharged from our service earlier this month after admission for sepsis and cellulitis of RLE.  She was started on pen vk for prophylaxis.  Patient on Pen vk since Tuesday.  Today she noted chills, fever, and warmth and pain to L leg.  Took temp at home and was 103.6.  Tylenol at 11pm.  Presents to ED.   ED Course: Tm 102.5.  WBC 10.6, Lactate 2.97.  HR 107.  Given 1L IVF, zosyn and vanc.   Review of Systems: As per HPI otherwise 10 point review of systems negative.   Past Medical History:  Diagnosis Date  . Acute renal failure (ARF) ([place]) 11/2012    multifactorial-likely secondary to ATN in the setting of sepsis and hypotension, and also from rhabdomyolysis  . Anemia   . Chronic diastolic CHF (congestive heart failure) ([place])   . Depression   . Diabetes mellitus ([place])   . GERD (gastroesophageal reflux disease)   . Hyperlipidemia   . Hypertension   . Hypothyroidism   . Morbid obesity with BMI of 70 and over, adult ([place])   . Obesity hypoventilation syndrome ([place])   . OSA (obstructive sleep apnea)   . PVC's (premature ventricular contractions)   . Recurrent cellulitis of lower leg    LLE, venous insuff  . Sepsis ([place]) 11/2012   Secondary to cellulitis  . Venous insufficiency of leg     Past Surgical History:  Procedure Laterality Date  . IR FLUORO GUIDE CV MIDLINE PICC RIGHT  03/28/2017  . IR US GUIDE VASC ACCESS RIGHT  03/28/2017  . WISDOM TOOTH EXTRACTION       reports that she has quit smoking. She quit after 0.00 years of use. She has never used smokeless tobacco. She reports that she  does not drink alcohol or use drugs.  Allergies  Allergen Reactions  . Aspirin Hives and Swelling    REACTION: throat swelling, hives  . Doxycycline Other (See Comments)    Abdominal pain  . Lisinopril Cough  . Niaspan [Niacin Er]     Caused flushing    Family History  Problem Relation Age of Onset  . Hypertension Mother   . Hypertension Father   . Heart disease Father        before age 26     Prior to Admission medications   Medication Sig Start Date End Date Taking? Authorizing Provider  acetaminophen (TYLENOL) 325 MG tablet Take 650 mg by mouth every 6 (six) hours as needed for fever.   Yes [provider]  empagliflozin (JARDIANCE) 10 MG TABS tablet Take 10 mg by mouth daily. Follow-up appt is due must make appt for future refills 03/29/17  Yes Golden Circle, FNP  furosemide (LASIX) 40 MG tablet Take 60 mg by mouth 2 (two) times daily.   Yes [provider]  levothyroxine (SYNTHROID, LEVOTHROID) 150 MCG tablet Take 1 tablet (150 mcg total) by mouth daily before breakfast. Follow-up appt is due must make appt for future refills 03/29/17  Yes Golden Circle, FNP  losartan (COZAAR) 25 MG tablet Take 25 mg by mouth  daily.   Yes [provider]  metolazone (ZAROXOLYN) 2.5 MG tablet Take 1 tablet by mouth 1 time a week on Fridays before taking lasix 12/20/16  Yes Dunn, Dayna N, PA-C  metoprolol succinate (TOPROL-XL) 50 MG 24 hr tablet Take 1 tablet (50 mg total) by mouth daily. Follow-up appt is due must make appt for future refills 03/29/17  Yes Golden Circle, FNP  penicillin v potassium (VEETID) 500 MG tablet Take 1 tablet (500 mg total) by mouth daily. 04/12/17  Yes Golden Circle, FNP  potassium chloride SA (K-DUR,KLOR-CON) 20 MEQ tablet Take 2 tablets (40 mEq total) by mouth daily. 01/06/17  Yes Dunn, Dayna N, PA-C  rosuvastatin (CRESTOR) 10 MG tablet Take 1 tablet (10 mg total) by mouth daily. 10/25/16  Yes Golden Circle, FNP    Physical  Exam: Vitals:   04/17/17 0030 04/17/17 0045 04/17/17 0129 04/17/17 0145  BP: (!) 159/83 (!) 179/84 (!) 132/120 (!) 139/56  Pulse: (!) 110 (!) 106 (!) 106 (!) 106  Resp: (!) 24 18  19   Temp:   (!) 102.4 F (39.1 C)   TempSrc:   Oral   SpO2: 98% 100% 90% 95%  Weight:      Height:        Constitutional: NAD, calm, comfortable Eyes: PERRL, lids and conjunctivae normal ENMT: Mucous membranes are moist. Posterior pharynx clear of any exudate or lesions.Normal dentition.  Neck: normal, supple, no masses, no thyromegaly Respiratory: clear to auscultation bilaterally, no wheezing, no crackles. Normal respiratory effort. No accessory muscle use.  Cardiovascular: Regular rate and rhythm, no murmurs / rubs / gallops. No extremity edema. 2+ pedal pulses. No carotid bruits.  Abdomen: no tenderness, no masses palpated. No hepatosplenomegaly. Bowel sounds positive.  Musculoskeletal: no clubbing / cyanosis. No joint deformity upper and lower extremities. Good ROM, no contractures. Normal muscle tone.  Skin: L leg erythema and tenderness, R leg still somewhat erythematous as well. Neurologic: CN 2-12 grossly intact. Sensation intact, DTR normal. Strength 5/5 in all 4.  Psychiatric: Normal judgment and insight. Alert and oriented x 3. Normal mood.    Labs on Admission: I have personally reviewed following labs and imaging studies  CBC:  Recent Labs Lab 04/16/17 2351  WBC 10.9*  NEUTROABS 9.3*  HGB 13.8  HCT 45.0  MCV 89.8  PLT 563   Basic Metabolic Panel:  Recent Labs Lab 04/16/17 2351  NA 136  K 4.0  CL 97*  CO2 28  GLUCOSE 147*  BUN 15  CREATININE 1.16*  CALCIUM 9.4   GFR: Estimated Creatinine Clearance: 118.2 mL/min (A) (by C-G formula based on SCr of 1.16 mg/dL (H)). Liver Function Tests:  Recent Labs Lab 04/16/17 2351  AST 18  ALT 15  ALKPHOS 70  BILITOT 1.1  PROT 8.1  ALBUMIN 3.6   No results for input(s): LIPASE, AMYLASE in the last 168 hours. No results for  input(s): AMMONIA in the last 168 hours. Coagulation Profile: No results for input(s): INR, PROTIME in the last 168 hours. Cardiac Enzymes: No results for input(s): CKTOTAL, CKMB, CKMBINDEX, TROPONINI in the last 168 hours. BNP (last 3 results)  Recent Labs  08/30/16 1413  PROBNP 641.0*   HbA1C: No results for input(s): HGBA1C in the last 72 hours. CBG: No results for input(s): GLUCAP in the last 168 hours. Lipid Profile: No results for input(s): CHOL, HDL, LDLCALC, TRIG, CHOLHDL, LDLDIRECT in the last 72 hours. Thyroid Function Tests: No results for input(s): TSH, T4TOTAL, FREET4,  T3FREE, THYROIDAB in the last 72 hours. Anemia Panel: No results for input(s): VITAMINB12, FOLATE, FERRITIN, TIBC, IRON, RETICCTPCT in the last 72 hours. Urine analysis:    Component Value Date/Time   COLORURINE STRAW (A) 04/16/2017 0003   APPEARANCEUR CLEAR 04/16/2017 0003   LABSPEC 1.010 04/16/2017 0003   PHURINE 6.0 04/16/2017 0003   GLUCOSEU >=500 (A) 04/16/2017 0003   HGBUR SMALL (A) 04/16/2017 0003   BILIRUBINUR NEGATIVE 04/16/2017 0003   KETONESUR NEGATIVE 04/16/2017 0003   PROTEINUR 30 (A) 04/16/2017 0003   UROBILINOGEN 0.2 07/25/2014 1040   NITRITE NEGATIVE 04/16/2017 0003   LEUKOCYTESUR NEGATIVE 04/16/2017 0003    Radiological Exams on Admission: No results found.  EKG: Independently reviewed.  Assessment/Plan Principal Problem:   Sepsis, unspecified organism [facility]) Active Problems:   Essential hypertension   Recurrent cellulitis of lower leg   Obesity hypoventilation syndrome (HCC)   Diabetes mellitus type 2 in obese (HCC)   Chronic diastolic CHF (congestive heart failure) (HCC)   Cellulitis of left leg    1. Cellulitis of L leg and sepsis - 1. Will do rocephin and vanc since this worked last admit 2. Note to pharm: reduce vanc dose since vanc trough was 37 last admit 3. BCx pending 4. Call ID in AM to see if they have anything else to suggest for PPx 5. IVF - 1L in ED  and 125 cc/hr 6. Repeat CBC/BMP in AM 2. H/o CHF - watch for fluid overload with IVF, got fluid overload last admit 3. HTN - holding home BP meds in setting of sepsis 4. DM2 - hold jardiance and put patient on mod scale SSI AC 5. HVOS - CPAP at night  DVT prophylaxis: Lovenox Code Status: Full Family Communication: No family in room Disposition Plan: Home after admit Consults called: None Admission status: Admit to inpatient   [place], [facility] Pager [phone]  If 7AM-7PM, please contact day team taking care of patient [email or web address] Password [email or web address]  04/17/2017, 1:55 AM

## 2017-04-17 NOTE — Progress Notes (Signed)
Reported off to the oncoming tech to check with Pt regarding bath.

## 2017-04-17 NOTE — Consult Note (Signed)
[facility] for Infectious Disease  Total days of antibiotics 2        Day 2 vanco        Day 1 ceftriaxone               Reason for Consult: recurrent cellulitis    Referring Physician: grunz  Principal Problem:   Sepsis, unspecified organism [facility]) Active Problems:   Essential hypertension   Recurrent cellulitis of lower leg   Obesity hypoventilation syndrome (HCC)   Diabetes mellitus type 2 in obese (HCC)   Chronic diastolic CHF (congestive heart failure) (HCC)   Cellulitis of left leg    HPI: Alexandra Beck is a 36 y.o. female with morbid obesity, DM, chronic diastolic CHF, venous insufficiency who was recently hospitalized for right lower leg cellulitis. This past admission was her 3rd hospital admission for cellulitis since sep 2017. Given she had such frequent recurrence, she was asked to start taking prophylaxis to prevent recurrence. She took 1 dose of clindamycin and did not tolerate the GI side effects/ pill esophagitis? then changed to penicillin VK 580m daily.  While on penicillin for 4 days, she started to notice warmth, pain and left leg erythema on the day prior to admit. She states that she previously would get cellulitis recurrence in the left leg in the past. She was admitted on 5/20 with fevers, chills, temp up to 103F, WBc of 12.6, with main complaint of warmth and pain to Left leg (whereas las admission it was her right leg). She was started on vanco and piptazo - now down to ceftriaxone. Blood cx remain negative. She is starting to notice improvement in the left leg but exquisitely tender to calf area. Not trauma or cut to leg prior to this hospitalization. Her right leg is improved with swelling, but now dry flaky skin and still has some pinkish/blanching erythema.  In the past, she has had her legs wrapped, unna boot, which has helped with her venous insufficiency.  She said she has tolerated clindamycin in the past but is unclear why she did not tolerate it  and wonders if it is the gel cap coating. She states that she has similar symptoms with doxycycline   Past Medical History:  Diagnosis Date  . [place]) [place]    multifactorial-likely secondary to ATN in the setting of sepsis and hypotension, and also from rhabdomyolysis  . Anemia   . Chronic diastolic CHF (congestive heart failure) ([place]   . Depression   . Diabetes mellitus ([place]   . GERD (gastroesophageal reflux disease)   . Hyperlipidemia   . Hypertension   . Hypothyroidism   . Morbid obesity with BMI of 70 and over, adult ([place]   . Obesity hypoventilation syndrome ([place]   . OSA (obstructive sleep apnea)   . PVC's (premature ventricular contractions)   . Recurrent cellulitis of lower leg    LLE, venous insuff  . [place]   Secondary to cellulitis  . Venous insufficiency of leg     Allergies:  Allergies  Allergen Reactions  . Aspirin Hives and Swelling    REACTION: throat swelling, hives  . Doxycycline Other (See Comments)    Abdominal pain  . Lisinopril Cough  . Niaspan [Niacin Er]     Caused flushing    MEDICATIONS: . enoxaparin (LOVENOX) injection  100 mg Subcutaneous Q24H  . furosemide  60 mg Oral BID  . insulin aspart  0-15 Units Subcutaneous TID  WC  . levothyroxine  150 mcg Oral QAC breakfast  . losartan  25 mg Oral Daily  . [START ON 04/21/2017] metolazone  2.5 mg Oral Q Fri  . metoprolol succinate  50 mg Oral Daily  . potassium chloride SA  40 mEq Oral Daily  . rosuvastatin  10 mg Oral Daily    Social History  Substance Use Topics  . Smoking status: Former Smoker    Years: 0.00  . Smokeless tobacco: Never Used  . Alcohol use No    Family History  Problem Relation Age of Onset  . Hypertension Mother   . Hypertension Father   . Heart disease Father        before age 60     Review of Systems  Constitutional: positive for fever, chills, diaphoresis, activity change, appetite change, fatigue and unexpected  weight change.  HENT: Negative for congestion, sore throat, rhinorrhea, sneezing, trouble swallowing and sinus pressure.  Eyes: Negative for photophobia and visual disturbance.  Respiratory: Negative for cough, chest tightness, shortness of breath, wheezing and stridor.  Cardiovascular: Negative for chest pain, palpitations and leg swelling.  Gastrointestinal: Negative for nausea, vomiting, abdominal pain, diarrhea, constipation, blood in stool, abdominal distention and anal bleeding.  Genitourinary: Negative for dysuria, hematuria, flank pain and difficulty urinating.  Musculoskeletal: Negative for myalgias, back pain, joint swelling, arthralgias and gait problem.  Skin: positive for rash. for color change, pallor, rash and wound.  Neurological: Negative for dizziness, tremors, weakness and light-headedness.  Hematological: Negative for adenopathy. Does not bruise/bleed easily.  Psychiatric/Behavioral: Negative for behavioral problems, confusion, sleep disturbance, dysphoric mood, decreased concentration and agitation.     OBJECTIVE: Temp:  [98.6 F (37 C)-103 F (39.4 C)] 98.8 F (37.1 C) (05/21 1234) Pulse Rate:  [67-110] 67 (05/21 1234) Resp:  [18-24] 18 (05/21 1234) BP: (126-184)/(56-120) 126/70 (05/21 1234) SpO2:  [88 %-100 %] 96 % (05/21 1234) Weight:  [450 lb (204.1 kg)-462 lb 1.6 oz (209.6 kg)] 462 lb 1.6 oz (209.6 kg) (05/21 0330) Physical Exam  Constitutional:  oriented to person, place, and time. appears well-developed and well-nourished. No distress.  HENT: [place]/AT, PERRLA, no scleral icterus Mouth/Throat: Oropharynx is clear and moist. No oropharyngeal exudate.  Cardiovascular: distant heart sounds.Normal rate, regular rhythm and normal heart sounds.  No murmur heard.  Pulmonary/Chest: Effort normal and breath sounds normal. No respiratory distress.  has no wheezes.  Neck = supple, no nuchal rigidity Abdominal: obese contour. Soft. Bowel sounds are normal.  exhibits no  distension. There is no tenderness.  Lymphadenopathy: no cervical adenopathy. No axillary adenopathy Skin: right calf has blanching erythema, trace edema. Left calf and pretibial areas from knee to ankle- has warmth, erythema, that is blanching, tenderness to left calf. Spares the foot interestingly. Psychiatric: tearful  LABS: Results for orders placed or performed during the hospital encounter of 04/16/17 (from the past 48 hour(s))  Urinalysis, Routine w reflex microscopic     Status: Abnormal   Collection Time: 04/16/17 12:03 AM  Result Value Ref Range   Color, Urine STRAW (A) YELLOW   APPearance CLEAR CLEAR   Specific Gravity, Urine 1.010 1.005 - 1.030   pH 6.0 5.0 - 8.0   Glucose, UA >=500 (A) NEGATIVE mg/dL   Hgb urine dipstick SMALL (A) NEGATIVE   Bilirubin Urine NEGATIVE NEGATIVE   Ketones, ur NEGATIVE NEGATIVE mg/dL   Protein, ur 30 (A) NEGATIVE mg/dL   Nitrite NEGATIVE NEGATIVE   Leukocytes, UA NEGATIVE NEGATIVE   RBC / HPF 0-5   0 - 5 RBC/hpf   WBC, UA 0-5 0 - 5 WBC/hpf   Bacteria, UA NONE SEEN NONE SEEN   Squamous Epithelial / LPF 0-5 (A) NONE SEEN  Comprehensive metabolic panel     Status: Abnormal   Collection Time: 04/16/17 11:51 PM  Result Value Ref Range   Sodium 136 135 - 145 mmol/L   Potassium 4.0 3.5 - 5.1 mmol/L   Chloride 97 (L) 101 - 111 mmol/L   CO2 28 22 - 32 mmol/L   Glucose, Bld 147 (H) 65 - 99 mg/dL   BUN 15 6 - 20 mg/dL   Creatinine, Ser 1.16 (H) 0.44 - 1.00 mg/dL   Calcium 9.4 8.9 - 10.3 mg/dL   Total Protein 8.1 6.5 - 8.1 g/dL   Albumin 3.6 3.5 - 5.0 g/dL   AST 18 15 - 41 U/L   ALT 15 14 - 54 U/L   Alkaline Phosphatase 70 38 - 126 U/L   Total Bilirubin 1.1 0.3 - 1.2 mg/dL   GFR calc non Af Amer 60 (L) >60 mL/min   GFR calc Af Amer >60 >60 mL/min    Comment: (NOTE) The eGFR has been calculated using the CKD EPI equation. This calculation has not been validated in all clinical situations. eGFR's persistently <60 mL/min signify possible Chronic  Kidney Disease.    Anion gap 11 5 - 15  CBC with Differential     Status: Abnormal   Collection Time: 04/16/17 11:51 PM  Result Value Ref Range   WBC 10.9 (H) 4.0 - 10.5 K/uL   RBC 5.01 3.87 - 5.11 MIL/uL   Hemoglobin 13.8 12.0 - 15.0 g/dL   HCT 45.0 36.0 - 46.0 %   MCV 89.8 78.0 - 100.0 fL   MCH 27.5 26.0 - 34.0 pg   MCHC 30.7 30.0 - 36.0 g/dL   RDW 17.9 (H) 11.5 - 15.5 %   Platelets 177 150 - 400 K/uL   Neutrophils Relative % 85 %   Neutro Abs 9.3 (H) 1.7 - 7.7 K/uL   Lymphocytes Relative 7 %   Lymphs Abs 0.8 0.7 - 4.0 K/uL   Monocytes Relative 7 %   Monocytes Absolute 0.7 0.1 - 1.0 K/uL   Eosinophils Relative 1 %   Eosinophils Absolute 0.1 0.0 - 0.7 K/uL   Basophils Relative 0 %   Basophils Absolute 0.0 0.0 - 0.1 K/uL  I-Stat CG4 Lactic Acid, ED     Status: Abnormal   Collection Time: 04/17/17 12:14 AM  Result Value Ref Range   Lactic Acid, Venous 2.97 (HH) 0.5 - 1.9 mmol/L   Comment NOTIFIED PHYSICIAN   CBC     Status: Abnormal   Collection Time: 04/17/17  1:13 AM  Result Value Ref Range   WBC 12.6 (H) 4.0 - 10.5 K/uL   RBC 5.07 3.87 - 5.11 MIL/uL   Hemoglobin 13.7 12.0 - 15.0 g/dL   HCT 45.6 36.0 - 46.0 %   MCV 89.9 78.0 - 100.0 fL   MCH 27.0 26.0 - 34.0 pg   MCHC 30.0 30.0 - 36.0 g/dL   RDW 18.1 (H) 11.5 - 15.5 %   Platelets 173 150 - 400 K/uL  Basic metabolic panel     Status: Abnormal   Collection Time: 04/17/17  1:13 AM  Result Value Ref Range   Sodium 136 135 - 145 mmol/L   Potassium 3.8 3.5 - 5.1 mmol/L   Chloride 96 (L) 101 - 111 mmol/L   CO2 30 22 -  32 mmol/L   Glucose, Bld 143 (H) 65 - 99 mg/dL   BUN 16 6 - 20 mg/dL   Creatinine, Ser 1.17 (H) 0.44 - 1.00 mg/dL   Calcium 9.5 8.9 - 10.3 mg/dL   GFR calc non Af Amer 59 (L) >60 mL/min   GFR calc Af Amer >60 >60 mL/min    Comment: (NOTE) The eGFR has been calculated using the CKD EPI equation. This calculation has not been validated in all clinical situations. eGFR's persistently <60 mL/min signify  possible Chronic Kidney Disease.    Anion gap 10 5 - 15  I-Stat CG4 Lactic Acid, ED     Status: Abnormal   Collection Time: 04/17/17  3:21 AM  Result Value Ref Range   Lactic Acid, Venous 3.47 (HH) 0.5 - 1.9 mmol/L   Comment NOTIFIED PHYSICIAN   Lactic acid, plasma     Status: None   Collection Time: 04/17/17  6:23 AM  Result Value Ref Range   Lactic Acid, Venous 1.8 0.5 - 1.9 mmol/L  Glucose, capillary     Status: Abnormal   Collection Time: 04/17/17  7:59 AM  Result Value Ref Range   Glucose-Capillary 128 (H) 65 - 99 mg/dL  Lactic acid, plasma     Status: None   Collection Time: 04/17/17  8:19 AM  Result Value Ref Range   Lactic Acid, Venous 1.3 0.5 - 1.9 mmol/L  Glucose, capillary     Status: Abnormal   Collection Time: 04/17/17 12:33 PM  Result Value Ref Range   Glucose-Capillary 115 (H) 65 - 99 mg/dL    MICRO: 5/21 blood cx ngtd, mrsa screen is negative IMAGING:  Assessment/Plan: 35yo F with recurrent non-purulent cellulitis likely RF of venous stasis, DM, obesity and prior cellulitis -. She only reports taking penicillin 500mg daily but should be twice a day. I also wonder if dosing for her BMI would also be an issue.  - recommend to change ceftriaxone to cefazolin 2gm q 8hr, and discontinue ceftriaxone and vancomycin - recommend to get wound care to see patient towards end of hospitalization for unna boot  Or something similar type of lower extremity wrapping bilaterally - she would benefit from going to clinic that could monitor her legs/continues with unna boot wrapping - she does not appear to be colonized with MRSA, based on last month screening. Recommend to rescreen nares. If staph aureus colonized, it might be worth decolonizing with mupirocin and CHG bathing  - she is still a candidate for prophylaxis would consider amox 500mg TID +/- bactrim for a month then start to narrow to one agent   B.  MD [facility] [phone]   

## 2017-04-17 NOTE — Progress Notes (Signed)
Patient has home machine and can place self on and off.

## 2017-04-17 NOTE — Progress Notes (Signed)
Tech offered Pt a bath. Pt stated not right now. Tech will check back on bath later.

## 2017-04-17 NOTE — ED Notes (Signed)
Attempted to call report

## 2017-04-18 LAB — GLUCOSE, CAPILLARY
GLUCOSE-CAPILLARY: 97 mg/dL (ref 65–99)
Glucose-Capillary: 151 mg/dL — ABNORMAL HIGH (ref 65–99)
Glucose-Capillary: 142 mg/dL — ABNORMAL HIGH (ref 65–99)
Glucose-Capillary: 107 mg/dL — ABNORMAL HIGH (ref 65–99)

## 2017-04-18 LAB — MRSA PCR SCREENING: MRSA by PCR: NEGATIVE

## 2017-04-18 MED ORDER — CEFAZOLIN SODIUM-DEXTROSE 2-4 GM/100ML-% IV SOLN
2.0000 g | Freq: Three times a day (TID) | INTRAVENOUS | Status: DC
  Administered 2017-04-18 – 2017-04-21 (×11): 2 g via INTRAVENOUS
  Filled 2017-04-18 (×12): qty 100

## 2017-04-18 MED ORDER — LEVOTHYROXINE SODIUM 75 MCG PO TABS
150.0000 ug | ORAL_TABLET | Freq: Every day | ORAL | Status: DC
  Administered 2017-04-19 – 2017-04-21 (×3): 150 ug via ORAL
  Filled 2017-04-18 (×3): qty 2

## 2017-04-18 NOTE — Progress Notes (Signed)
PROGRESS NOTE  GENETTE HUERTAS  UKG:[id number] DOB: Feb 11, 1981 DOA: 04/16/2017 PCP: [facility], FNP   Brief Narrative: Alexandra Beck is a 36 y.o. female with a history of morbid obesity, OSA, venous insufficiency, chronic HFpEF, DM (last HbA1c 6.6%), hypothyroidism, and recurrent cellulitis who presented with left leg redness, pain, and fever.   She had been discharged 5/3 for RLE cellulitis, her 4th hospitalization for cellulitis, treated with vancomycin and zosyn, narrowed to ceftriaxone and discharged on keflex. She completed this and started clindamycin which caused belching/upper abdominal discomfort, so was started on penicillin for prophylaxis 4 days prior to developing abrupt onset of left lower leg redness, swelling, tenderness, and fever to 103.65F . She took a tylenol and presented to the ED where she was febrile to 102.5F, tachycardic, with a leukocytosis to 10.6, lactate elevation to 2.97. IV fluids, vancomycin and zosyn started in ED, transitioned to vancomycin, ceftriaxone overnight. Sepsis has improved. ID recommended transition to ancef alone.   Assessment & Plan: Principal Problem:   Sepsis, unspecified organism [facility]) Active Problems:   Essential hypertension   Recurrent cellulitis of lower leg   Obesity hypoventilation syndrome (HCC)   Diabetes mellitus type 2 in obese (HCC)   Chronic diastolic CHF (congestive heart failure) (HCC)   Cellulitis of left leg  Sepsis due to left lower extremity non-purulent cellulitis: Had fever, tachycardia, leukocytosis, lactate elevation at admission. Has recurrent cellulitis, and RLE still appears erythematous. HIV NR at recent hospitalization. PCN prophylactic dose was likely insufficient. - Continue ancef per ID recommendations. Hopeful to avoid PICC line (ordered at admission due to poor access in the past).  - Blood cultures drawn, will monitor - MRSA screen, attempt to decolonize if positive. - Elevation, TED hose, consider unna's  boot. Wound RN consulted.  - Recommend outpatient follow up in wound clinic for ongoing observation - Per ID, consider amoxicillin 500mg  TID +/- bactrim x1 month then narrow to single agent for prophylaxis.   Morbid obesity: BMI is 84.52 kg/m. Pt has been to bariatric surgery information session and is contemplating going forward.  - Nutrition consult  Chronic HFpEF: EDW between 200-207kg per last DC summary. Received IVF's for sepsis initially, will have to monitor closely.  - Continue home lasix 60mg  BID, metolazone 2.5mg  weekly (Rx says Fridays) - Saline lock IVF - No acute issues noted on telemetry, ok to DC monitor  Essential hypertension: Chronic, stable - Continue losartan, metoprolol  NIDDM: HbA1c 6.6% - Hold empagliflozin - SSI  Hypothyroidism: Chronic, stable. TSH 1.310 in Jan 2018.  - Continue synthroid 150 g daily  Obstructive sleep apnea: Chronic, stable - Continue CPAP  Hyperlipidemia: Chronic, stable - Continue statin  DVT prophylaxis: Lovenox Code Status: Full Family Communication: None at bedside this AM.  Disposition Plan: Anticipate DC to home environment  Consultants:   ID  Procedures:   None  Antimicrobials:  Vancomycin 5/20 > 5/22  Zosyn 5/20  Ceftriaxone 5/21  > 5/22  Ancef 5/22 >>   Subjective: Had fever overnight, not as high as PTA. No chest pain, dyspnea.  Objective: Vitals:   04/17/17 1500 04/17/17 2105 04/18/17 0018 04/18/17 0603  BP: 122/69 139/78 (!) 117/58 (!) 129/55  Pulse: 68 75 80 88  Resp: 18 20 20 18   Temp: 98 F (36.7 C) 98.7 F (37.1 C) 99.1 F (37.3 C) (!) 100.5 F (38.1 C)  TempSrc: Oral Oral Oral Oral  SpO2: 96% 95% 93% 100%  Weight:    (!) 209.8 kg (462  lb 8 oz)  Height:        Intake/Output Summary (Last 24 hours) at 04/18/17 0745 Last data filed at 04/18/17 8938  Gross per 24 hour  Intake              460 ml  Output             3951 ml  Net            -3491 ml   Filed Weights   04/16/17  2344 04/17/17 0330 04/18/17 0603  Weight: (!) 204.1 kg (450 lb) (!) 209.6 kg (462 lb 1.6 oz) (!) 209.8 kg (462 lb 8 oz)    Examination: General exam: Pleasant, obese female in no distress  Respiratory system: Non-labored breathing room air. Clear to auscultation bilaterally.  Cardiovascular system: Regular rate and rhythm. No murmur, rub, or gallop. UTD JVD. Gastrointestinal system: Abdomen obese, soft, non-tender, non-distended, with normoactive bowel sounds. No organomegaly or masses felt. Central nervous system: Alert and oriented. No focal neurological deficits. Extremities: LLE with tender, blanching erythema in lower leg extending circumferentially limited above ankle and below knee with induration developing dependently. No fluctuance. RLE also with erythema without tenderness. No inguinal lymphadenopathy palpated.  Skin: As above. Psychiatry: Judgement and insight appear normal. Mood & affect appropriate.   Data Reviewed: I have personally reviewed following labs and imaging studies  CBC:  Recent Labs Lab 04/16/17 2351 04/17/17 0113  WBC 10.9* 12.6*  NEUTROABS 9.3*  --   HGB 13.8 13.7  HCT 45.0 45.6  MCV 89.8 89.9  PLT 177 101   Basic Metabolic Panel:  Recent Labs Lab 04/16/17 2351 04/17/17 0113  NA 136 136  K 4.0 3.8  CL 97* 96*  CO2 28 30  GLUCOSE 147* 143*  BUN 15 16  CREATININE 1.16* 1.17*  CALCIUM 9.4 9.5   GFR: Estimated Creatinine Clearance: 119.6 mL/min (A) (by C-G formula based on SCr of 1.17 mg/dL (H)). Liver Function Tests:  Recent Labs Lab 04/16/17 2351  AST 18  ALT 15  ALKPHOS 70  BILITOT 1.1  PROT 8.1  ALBUMIN 3.6   No results for input(s): LIPASE, AMYLASE in the last 168 hours. No results for input(s): AMMONIA in the last 168 hours. Coagulation Profile: No results for input(s): INR, PROTIME in the last 168 hours. Cardiac Enzymes: No results for input(s): CKTOTAL, CKMB, CKMBINDEX, TROPONINI in the last 168 hours. BNP (last 3  results)  Recent Labs  08/30/16 1413  PROBNP 641.0*   HbA1C: No results for input(s): HGBA1C in the last 72 hours. CBG:  Recent Labs Lab 04/17/17 0759 04/17/17 1233 04/17/17 1619 04/17/17 2226 04/18/17 0742  GLUCAP 128* 115* 109* 88 151*   Lipid Profile: No results for input(s): CHOL, HDL, LDLCALC, TRIG, CHOLHDL, LDLDIRECT in the last 72 hours. Thyroid Function Tests: No results for input(s): TSH, T4TOTAL, FREET4, T3FREE, THYROIDAB in the last 72 hours. Anemia Panel: No results for input(s): VITAMINB12, FOLATE, FERRITIN, TIBC, IRON, RETICCTPCT in the last 72 hours. Urine analysis:    Component Value Date/Time   COLORURINE STRAW (A) 04/16/2017 0003   APPEARANCEUR CLEAR 04/16/2017 0003   LABSPEC 1.010 04/16/2017 0003   PHURINE 6.0 04/16/2017 0003   GLUCOSEU >=500 (A) 04/16/2017 0003   HGBUR SMALL (A) 04/16/2017 0003   BILIRUBINUR NEGATIVE 04/16/2017 0003   KETONESUR NEGATIVE 04/16/2017 0003   PROTEINUR 30 (A) 04/16/2017 0003   UROBILINOGEN 0.2 07/25/2014 1040   NITRITE NEGATIVE 04/16/2017 0003   LEUKOCYTESUR NEGATIVE 04/16/2017 0003  No results found for this or any previous visit (from the past 240 hour(s)).    Radiology Studies: No results found.  Scheduled Meds: . enoxaparin (LOVENOX) injection  100 mg Subcutaneous Q24H  . furosemide  60 mg Oral BID  . insulin aspart  0-15 Units Subcutaneous TID WC  . levothyroxine  150 mcg Oral QAC breakfast  . losartan  25 mg Oral Daily  . [START ON 04/21/2017] metolazone  2.5 mg Oral Q Fri  . metoprolol succinate  50 mg Oral Daily  . potassium chloride SA  40 mEq Oral Daily  . rosuvastatin  10 mg Oral Daily   Continuous Infusions:    LOS: 1 day   Time spent: 25 minutes.  Vance Gather, MD [facility] Pager [phone]  If 7PM-7AM, please contact night-coverage [email or web address] Password [facility] 04/18/2017, 7:45 AM

## 2017-04-18 NOTE — Care Management Note (Addendum)
Case Management Note  Patient Details  Name: Alexandra Beck MRN: [id number] Date of Birth: March 25, 1981  Subjective/Objective:                 Patient with three admissions in the past 6 months for recurrent cellulitis, and edema. Has seen Dr Sharol Given and PA Vassie Moment for [name] and wrapping of lower extremities as outpatient. ID notes state patient likely to DC on PO abx. Patient may benefit from [facility] RN to assist with management of edema and assist in preventing readmissions. Spoke with patient to discuss benefits of [place] and how it can prevent readmissions. CM explained [facility] RN role and how it would benefit her in promoting her healing and wellness at home. Patient continued to challenge how she could benefit from home care.  She stated she didn't think it would help and declined.   Action/Plan:    5/25 DC to home self care.  Expected Discharge Date:                  Expected Discharge Plan:  Home/Self Care  In-House Referral:     Discharge planning Services  CM Consult  Post Acute Care Choice:    Choice offered to:     DME Arranged:    DME Agency:     HH Arranged:    HH Agency:     Status of Service:  In process, will continue to follow  If discussed at Long Length of Stay Meetings, dates discussed:    Additional Comments:  Carles Collet, RN 04/18/2017, 4:09 PM

## 2017-04-18 NOTE — Progress Notes (Signed)
Patient running a fever of 100.5, Tylenol given.will continue to monitor.

## 2017-04-18 NOTE — Progress Notes (Signed)
Pharmacy Antibiotic Note  Alexandra Beck is a 36 y.o. female admitted on 04/16/2017 with recurrent cellulitis.  Pharmacy has been consulted to change vanc and Rocephin to Ancef per ID recs.  Plan: Ancef 2g IV Q8H.  Height: 5\' 2"  (157.5 cm) Weight: (!) 462 lb 8 oz (209.8 kg) IBW/kg (Calculated) : 50.1  Temp (24hrs), Avg:99 F (37.2 C), Min:98 F (36.7 C), Max:100.5 F (38.1 C)   Recent Labs Lab 04/16/17 2351 04/17/17 0014 04/17/17 0113 04/17/17 0321 04/17/17 0623 04/17/17 0819  WBC 10.9*  --  12.6*  --   --   --   CREATININE 1.16*  --  1.17*  --   --   --   LATICACIDVEN  --  2.97*  --  3.47* 1.8 1.3    Estimated Creatinine Clearance: 119.6 mL/min (A) (by C-G formula based on SCr of 1.17 mg/dL (H)).    Allergies  Allergen Reactions  . Aspirin Hives and Swelling    REACTION: throat swelling, hives  . Doxycycline Other (See Comments)    Abdominal pain  . Lisinopril Cough  . Niaspan [Niacin Er]     Caused flushing    Antimicrobials this admission: 5/20 Zosyn 5/20 x1 Vanc 5/20 >> 5/22 Rocephin 5/20 >> 5/22 Ancef 5/22 >>   Microbiology results: Microbiology results: 5/21 BCx x2:   Thank you for allowing pharmacy to be a part of this patient's care.  Wynona Neat, PharmD, BCPS  04/18/2017 8:05 AM

## 2017-04-18 NOTE — Progress Notes (Signed)
    [facility] for Infectious Disease    Date of Admission:  04/16/2017   Total days of antibiotics 3        Day 1 cefazolin           ID: Alexandra Beck is a 36 y.o. female with recurrent cellulitis in the leg leg Principal Problem:   Sepsis, unspecified organism [facility]) Active Problems:   Essential hypertension   Recurrent cellulitis of lower leg   Obesity hypoventilation syndrome (HCC)   Diabetes mellitus type 2 in obese (HCC)   Chronic diastolic CHF (congestive heart failure) (HCC)   Cellulitis of left leg    Subjective: Fever last night with rigor but only up to 100.5. Less erythema to left leg but still swollen  Seen by wound care RN   Medications:  . enoxaparin (LOVENOX) injection  100 mg Subcutaneous Q24H  . furosemide  60 mg Oral BID  . insulin aspart  0-15 Units Subcutaneous TID WC  . levothyroxine  150 mcg Oral QAC breakfast  . losartan  25 mg Oral Daily  . [START ON 04/21/2017] metolazone  2.5 mg Oral Q Fri  . metoprolol succinate  50 mg Oral Daily  . potassium chloride SA  40 mEq Oral Daily  . rosuvastatin  10 mg Oral Daily    Objective: Vital signs in last 24 hours: Temp:  [98.5 F (36.9 C)-100.5 F (38.1 C)] 98.5 F (36.9 C) (05/22 1143) Pulse Rate:  [71-88] 71 (05/22 1143) Resp:  [18-20] 20 (05/22 1143) BP: (117-139)/(55-78) 133/64 (05/22 1143) SpO2:  [93 %-100 %] 97 % (05/22 1143) Weight:  [462 lb 8 oz (209.8 kg)] 462 lb 8 oz (209.8 kg) (05/22 0603) Physical Exam  Constitutional:  oriented to person, place, and time. appears well-developed and well-nourished. No distress.  HENT: [place]/AT, PERRLA, no scleral icterus Mouth/Throat: Oropharynx is clear and moist. No oropharyngeal exudate.  Ext: +1 edema BLE.  Skin: Skin is warm and dry.erythema still present but improved.  Psychiatric: a normal mood and affect.  behavior is normal.   Lab Results  Recent Labs  04/16/17 2351 04/17/17 0113  WBC 10.9* 12.6*  HGB 13.8 13.7  HCT 45.0 45.6  NA 136  136  K 4.0 3.8  CL 97* 96*  CO2 28 30  BUN 15 16  CREATININE 1.16* 1.17*   Liver Panel  Recent Labs  04/16/17 2351  PROT 8.1  ALBUMIN 3.6  AST 18  ALT 15  ALKPHOS 70  BILITOT 1.1   Microbiology: Blood cx ngtd Studies/Results: No results found.   Assessment/Plan: Cellulitis = improving since yesterday. Recommend to keep on cefazolin til discharge then we will switch to oral abtx. I suspect strep species. Appears to be nonpurulent and improved. I would have her legs wrapped prior to discharge to help minimize swelling and to follow up dr duda or his NP, erin zamora  Fever = improved but low grade fever last night. Continue to monitor  Leukocytosis = eleavetd on admit, recommend to recheck cbc tomorrow  Baxter Flattery [facility] Cell: [phone] Pager: [phone]  04/18/2017, 3:34 PM

## 2017-04-18 NOTE — Progress Notes (Signed)
Patient has home CPAP and can place self on and off.  

## 2017-04-18 NOTE — Plan of Care (Signed)
Problem: Food- and Nutrition-Related Knowledge Deficit (NB-1.1) Goal: Nutrition education Formal process to instruct or train a patient/client in a skill or to impart knowledge to help patients/clients voluntarily manage or modify food choices and eating behavior to maintain or improve health. Outcome: Completed/Met Date Met: 04/18/17  RD consulted for nutrition education regarding weight loss.  Body mass index is 84.59 kg/m. Pt meets criteria for morbid obesity class III based on current BMI.  RD provided "CHO Counting" handout from the Academy of [employer]etics. Emphasized the importance of serving sizes and provided examples of correct portions of common foods. Discussed importance of controlled and consistent intake throughout the day. Provided examples of ways to balance meals/snacks and encouraged intake of high-fiber, whole grain complex carbohydrates. Emphasized the importance of hydration with calorie-free beverages and limiting sugar-sweetened beverages. Encouraged pt to discuss physical activity options with physician. Teach back method used.  Expect good compliance.  Current diet order is Carb Modified, patient is consuming approximately 100% of meals at this time. Labs and medications reviewed. No further nutrition interventions warranted at this time. RD contact information provided. If additional nutrition issues arise, please re-consult RD.  [place], [place], [place] Pager [phone] After Hours Pager

## 2017-04-18 NOTE — Consult Note (Addendum)
[facility] Nurse wound consult note Reason for Consult: Consult requested for left leg cellulitis Wound type:   Left leg with generalized erythremia and edema; affected area extends from foot to below knee Dressing procedure/placement/frequency: No open wound or drainage requiring topical treatment at this time. Pt states she has worn Una boots in the past which were helpful in reducing edema.  If this therapy is desired once cellulitis begins to receede, then please consult ortho tech to apply and pt can be followed weekly after discharge by Dr Jess Barters office, since they have seen her in the past for compression wraps. Recommend leaving Una boots off at this time so leg can be frequently assessed for improving progress with cellulitis. Discussed plan of care with patient and she verbalized understanding. Please re-consult if further assistance is needed.  Thank-you,  Julien Girt MSN, [place], [place], [place], [place]

## 2017-04-19 DIAGNOSIS — I5032 Chronic diastolic (congestive) heart failure: Secondary | ICD-10-CM

## 2017-04-19 DIAGNOSIS — E662 Morbid (severe) obesity with alveolar hypoventilation: Secondary | ICD-10-CM

## 2017-04-19 DIAGNOSIS — L03119 Cellulitis of unspecified part of limb: Secondary | ICD-10-CM

## 2017-04-19 DIAGNOSIS — L03116 Cellulitis of left lower limb: Secondary | ICD-10-CM

## 2017-04-19 LAB — BASIC METABOLIC PANEL
Anion gap: 7 (ref 5–15)
BUN: 13 mg/dL (ref 6–20)
CO2: 28 mmol/L (ref 22–32)
CREATININE: 0.98 mg/dL (ref 0.44–1.00)
Calcium: 8.6 mg/dL — ABNORMAL LOW (ref 8.9–10.3)
Chloride: 102 mmol/L (ref 101–111)
GFR calc Af Amer: >60 mL/min (ref >60)
GLUCOSE: 128 mg/dL — AB (ref 65–99)
Potassium: 3.6 mmol/L (ref 3.5–5.1)
Sodium: 137 mmol/L (ref 135–145)

## 2017-04-19 LAB — GLUCOSE, CAPILLARY
GLUCOSE-CAPILLARY: 111 mg/dL — AB (ref 65–99)
GLUCOSE-CAPILLARY: 117 mg/dL — AB (ref 65–99)
Glucose-Capillary: 121 mg/dL — ABNORMAL HIGH (ref 65–99)
Glucose-Capillary: 97 mg/dL (ref 65–99)

## 2017-04-19 LAB — CBC
HCT: 40.7 % (ref 36.0–46.0)
Hemoglobin: 12.1 g/dL (ref 12.0–15.0)
MCH: 26.8 pg (ref 26.0–34.0)
MCHC: 29.7 g/dL — ABNORMAL LOW (ref 30.0–36.0)
MCV: 90.2 fL (ref 78.0–100.0)
PLATELETS: 151 10*3/uL (ref 150–400)
RBC: 4.51 MIL/uL (ref 3.87–5.11)
RDW: 18.4 % — ABNORMAL HIGH (ref 11.5–15.5)
WBC: 4.6 10*3/uL (ref 4.0–10.5)

## 2017-04-19 NOTE — Progress Notes (Signed)
Via PM nurse report, pt REFUSED standing weight this AM.

## 2017-04-19 NOTE — Progress Notes (Signed)
PROGRESS NOTE  Alexandra Beck  JJO:[id number] DOB: [birth year] DOA: 04/16/2017 PCP: [facility], FNP   Subjective: Denies any fever overnight, white blood cells dropped from 12.6 down to 4.6. Still has some redness, see attached photograph. Continue to have swelling, stop fluids (if any) and diurese.  Brief Narrative: Alexandra Beck is a 36 y.o. female with a history of morbid obesity, OSA, venous insufficiency, chronic HFpEF, DM (last HbA1c 6.6%), hypothyroidism, and recurrent cellulitis who presented with left leg redness, pain, and fever.   She had been discharged 5/3 for RLE cellulitis, her 4th hospitalization for cellulitis, treated with vancomycin and zosyn, narrowed to ceftriaxone and discharged on keflex. She completed this and started clindamycin which caused belching/upper abdominal discomfort, so was started on penicillin for prophylaxis 4 days prior to developing abrupt onset of left lower leg redness, swelling, tenderness, and fever to 103.49F . She took a tylenol and presented to the ED where she was febrile to 102.39F, tachycardic, with a leukocytosis to 10.6, lactate elevation to 2.97. IV fluids, vancomycin and zosyn started in ED, transitioned to vancomycin, ceftriaxone overnight. Sepsis has improved. ID recommended transition to ancef alone.   Assessment & Plan: Principal Problem:   Sepsis, unspecified organism [facility]) Active Problems:   Essential hypertension   Recurrent cellulitis of lower leg   Obesity hypoventilation syndrome (HCC)   Diabetes mellitus type 2 in obese (HCC)   Chronic diastolic CHF (congestive heart failure) (HCC)   Cellulitis of left leg  Sepsis due to left lower extremity non-purulent cellulitis: Had fever, tachycardia, leukocytosis, lactate elevation at admission. Has recurrent cellulitis, and RLE still appears erythematous. HIV NR at recent hospitalization. PCN prophylactic dose was likely insufficient. - Continue ancef per ID recommendations. Hopeful  to avoid PICC line (ordered at admission due to poor access in the past).  - Blood cultures drawn, will monitor - MRSA screen, attempt to decolonize if positive. - Likely she'll benefit from elevation and Ace wraps on discharge.  - Recommend outpatient follow up in wound clinic for ongoing observation - Per ID, consider amoxicillin 500mg  TID +/- bactrim x1 month then narrow to single agent for prophylaxis.   Morbid obesity: BMI is 84.52 kg/m. Pt has been to bariatric surgery information session and is contemplating going forward.  - Nutrition consult  Chronic HFpEF: EDW between 200-207kg per last DC summary. Received IVF's for sepsis initially, will have to monitor closely.  - Continue home lasix 60mg  BID, metolazone 2.5mg  weekly (Rx says Fridays) - No active symptoms of decompensation, give extra dose of Lasix to ensure negative fluid balance.  Essential hypertension: Chronic, stable - Continue losartan, metoprolol  NIDDM: HbA1c 6.6% - Hold empagliflozin - SSI  Hypothyroidism: Chronic, stable. TSH 1.310 in Jan 2018.  - Continue synthroid 150 g daily  Obstructive sleep apnea: Chronic, stable - Continue CPAP  Hyperlipidemia: Chronic, stable - Continue statin  DVT prophylaxis: Lovenox Code Status: Full Family Communication: None at bedside this AM.  Disposition Plan: Anticipate DC to home environment  Consultants:   ID  Procedures:   None  Antimicrobials:  Vancomycin 5/20 > 5/22  Zosyn 5/20  Ceftriaxone 5/21  > 5/22  Ancef 5/22 >>     Objective: Vitals:   04/18/17 1625 04/18/17 1954 04/19/17 0632 04/19/17 0900  BP: (!) 144/80 (!) 141/77 130/70   Pulse: 65 66 65   Resp:  18 18   Temp: 98.1 F (36.7 C) 98.2 F (36.8 C) 97.7 F (36.5 C)   TempSrc: Oral  Oral Oral   SpO2: 91% 96% 98%   Weight:   (!) 210.6 kg (464 lb 3.2 oz) (!) 209.2 kg (461 lb 4.8 oz)  Height:        Intake/Output Summary (Last 24 hours) at 04/19/17 1201 Last data filed at  04/19/17 1039  Gross per 24 hour  Intake             1435 ml  Output             2602 ml  Net            -1167 ml   Filed Weights   04/18/17 0603 04/19/17 0632 04/19/17 0900  Weight: (!) 209.8 kg (462 lb 8 oz) (!) 210.6 kg (464 lb 3.2 oz) (!) 209.2 kg (461 lb 4.8 oz)    Examination: General exam: Pleasant, obese female in no distress  Respiratory system: Non-labored breathing room air. Clear to auscultation bilaterally.  Cardiovascular system: Regular rate and rhythm. No murmur, rub, or gallop. UTD JVD. Gastrointestinal system: Abdomen obese, soft, non-tender, non-distended, with normoactive bowel sounds. No organomegaly or masses felt. Central nervous system: Alert and oriented. No focal neurological deficits. Extremities: LLE with tender, blanching erythema in lower leg extending circumferentially limited above ankle and below knee with induration developing dependently. No fluctuance. RLE also with erythema without tenderness. No inguinal lymphadenopathy palpated.  Skin: As above. Psychiatry: Judgement and insight appear normal. Mood & affect appropriate.   Data Reviewed: I have personally reviewed following labs and imaging studies  CBC:  Recent Labs Lab 04/16/17 2351 04/17/17 0113 04/19/17 0419  WBC 10.9* 12.6* 4.6  NEUTROABS 9.3*  --   --   HGB 13.8 13.7 12.1  HCT 45.0 45.6 40.7  MCV 89.8 89.9 90.2  PLT 177 173 818   Basic Metabolic Panel:  Recent Labs Lab 04/16/17 2351 04/17/17 0113 04/19/17 0419  NA 136 136 137  K 4.0 3.8 3.6  CL 97* 96* 102  CO2 28 30 28   GLUCOSE 147* 143* 128*  BUN 15 16 13   CREATININE 1.16* 1.17* 0.98  CALCIUM 9.4 9.5 8.6*   GFR: Estimated Creatinine Clearance: 142.4 mL/min (by C-G formula based on SCr of 0.98 mg/dL). Liver Function Tests:  Recent Labs Lab 04/16/17 2351  AST 18  ALT 15  ALKPHOS 70  BILITOT 1.1  PROT 8.1  ALBUMIN 3.6   No results for input(s): LIPASE, AMYLASE in the last 168 hours. No results for input(s):  AMMONIA in the last 168 hours. Coagulation Profile: No results for input(s): INR, PROTIME in the last 168 hours. Cardiac Enzymes: No results for input(s): CKTOTAL, CKMB, CKMBINDEX, TROPONINI in the last 168 hours. BNP (last 3 results)  Recent Labs  08/30/16 1413  PROBNP 641.0*   HbA1C: No results for input(s): HGBA1C in the last 72 hours. CBG:  Recent Labs Lab 04/18/17 1141 04/18/17 1621 04/18/17 2142 04/19/17 0723 04/19/17 1146  GLUCAP 97 142* 107* 111* 121*   Lipid Profile: No results for input(s): CHOL, HDL, LDLCALC, TRIG, CHOLHDL, LDLDIRECT in the last 72 hours. Thyroid Function Tests: No results for input(s): TSH, T4TOTAL, FREET4, T3FREE, THYROIDAB in the last 72 hours. Anemia Panel: No results for input(s): VITAMINB12, FOLATE, FERRITIN, TIBC, IRON, RETICCTPCT in the last 72 hours. Urine analysis:    Component Value Date/Time   COLORURINE STRAW (A) 04/16/2017 0003   APPEARANCEUR CLEAR 04/16/2017 0003   LABSPEC 1.010 04/16/2017 0003   PHURINE 6.0 04/16/2017 0003   GLUCOSEU >=500 (A) 04/16/2017 0003  HGBUR SMALL (A) 04/16/2017 0003   BILIRUBINUR NEGATIVE 04/16/2017 0003   KETONESUR NEGATIVE 04/16/2017 0003   PROTEINUR 30 (A) 04/16/2017 0003   UROBILINOGEN 0.2 07/25/2014 1040   NITRITE NEGATIVE 04/16/2017 0003   LEUKOCYTESUR NEGATIVE 04/16/2017 0003   Recent Results (from the past 240 hour(s))  Blood Culture (routine x 2)     Status: None (Preliminary result)   Collection Time: 04/17/17 12:56 AM  Result Value Ref Range Status   Specimen Description BLOOD RIGHT ARM  Final   Special Requests IN PEDIATRIC BOTTLE Blood Culture adequate volume  Final   Culture NO GROWTH 1 DAY  Final   Report Status PENDING  Incomplete  Blood Culture (routine x 2)     Status: None (Preliminary result)   Collection Time: 04/17/17  1:09 AM  Result Value Ref Range Status   Specimen Description BLOOD LEFT ANTECUBITAL  Final   Special Requests   Final    BOTTLES DRAWN AEROBIC AND  ANAEROBIC Blood Culture adequate volume   Culture NO GROWTH 1 DAY  Final   Report Status PENDING  Incomplete  MRSA PCR Screening     Status: None   Collection Time: 04/18/17  7:44 AM  Result Value Ref Range Status   MRSA by PCR NEGATIVE NEGATIVE Final    Comment:        The GeneXpert MRSA Assay (FDA approved for NASAL specimens only), is one component of a comprehensive MRSA colonization surveillance program. It is not intended to diagnose MRSA infection nor to guide or monitor treatment for MRSA infections.       Radiology Studies: No results found.  Scheduled Meds: . enoxaparin (LOVENOX) injection  100 mg Subcutaneous Q24H  . furosemide  60 mg Oral BID  . insulin aspart  0-15 Units Subcutaneous TID WC  . levothyroxine  150 mcg Oral QAC breakfast  . losartan  25 mg Oral Daily  . [START ON 04/21/2017] metolazone  2.5 mg Oral Q Fri  . metoprolol succinate  50 mg Oral Daily  . potassium chloride SA  40 mEq Oral Daily  . rosuvastatin  10 mg Oral Daily   Continuous Infusions: .  ceFAZolin (ANCEF) IV Stopped (04/19/17 0629)     LOS: 2 days   Time spent: 25 minutes.  Birdie Hopes, MD [facility] Pager [phone]  If 7PM-7AM, please contact night-coverage [email or web address] Password [email or web address] 04/19/2017, 12:01 PM

## 2017-04-19 NOTE — Progress Notes (Signed)
Patient has home CPAP and can place self on and off.  

## 2017-04-20 DIAGNOSIS — A419 Sepsis, unspecified organism: Principal | ICD-10-CM

## 2017-04-20 DIAGNOSIS — I10 Essential (primary) hypertension: Secondary | ICD-10-CM

## 2017-04-20 LAB — GLUCOSE, CAPILLARY
GLUCOSE-CAPILLARY: 115 mg/dL — AB (ref 65–99)
GLUCOSE-CAPILLARY: 113 mg/dL — AB (ref 65–99)
Glucose-Capillary: 120 mg/dL — ABNORMAL HIGH (ref 65–99)
Glucose-Capillary: 131 mg/dL — ABNORMAL HIGH (ref 65–99)

## 2017-04-20 MED ORDER — FUROSEMIDE 10 MG/ML IJ SOLN: 60.0000 mg | Freq: Three times a day (TID) | INTRAMUSCULAR | Status: DC

## 2017-04-20 MED ORDER — FUROSEMIDE 10 MG/ML IJ SOLN
60.0000 mg | Freq: Three times a day (TID) | INTRAMUSCULAR | Status: AC
  Administered 2017-04-20 (×2): 60 mg via INTRAVENOUS
  Filled 2017-04-20 (×2): qty 6

## 2017-04-20 NOTE — Progress Notes (Signed)
PROGRESS NOTE  Alexandra Beck  SJG:[id number] DOB: May 25, 1981 DOA: 04/16/2017 PCP: [facility], FNP   Subjective: Denies any fever overnight, white blood cells dropped from 12.6 down to 4.6. Still has some redness, see attached photograph. Continue to have swelling, stop fluids (if any) and diurese.  Brief Narrative: Alexandra Beck is a 36 y.o. female with a history of morbid obesity, OSA, venous insufficiency, chronic HFpEF, DM (last HbA1c 6.6%), hypothyroidism, and recurrent cellulitis who presented with left leg redness, pain, and fever.   She had been discharged 5/3 for RLE cellulitis, her 4th hospitalization for cellulitis, treated with vancomycin and zosyn, narrowed to ceftriaxone and discharged on keflex. She completed this and started clindamycin which caused belching/upper abdominal discomfort, so was started on penicillin for prophylaxis 4 days prior to developing abrupt onset of left lower leg redness, swelling, tenderness, and fever to 103.78F . She took a tylenol and presented to the ED where she was febrile to 102.19F, tachycardic, with a leukocytosis to 10.6, lactate elevation to 2.97. IV fluids, vancomycin and zosyn started in ED, transitioned to vancomycin, ceftriaxone overnight. Sepsis has improved. ID recommended transition to ancef alone.   Assessment & Plan: Principal Problem:   Sepsis, unspecified organism [facility]) Active Problems:   Essential hypertension   Recurrent cellulitis of lower leg   Obesity hypoventilation syndrome (HCC)   Diabetes mellitus type 2 in obese (HCC)   Chronic diastolic CHF (congestive heart failure) (HCC)   Cellulitis of left leg   Sepsis due to left lower extremity non-purulent cellulitis:  Had fever, tachycardia, leukocytosis, lactate elevation at admission. Has recurrent cellulitis, and RLE still appears erythematous. HIV NR at recent hospitalization. PCN prophylactic dose was likely insufficient. - Continue ancef per ID recommendations.  Hopeful to avoid PICC line (ordered at admission due to poor access in the past).  - Blood cultures drawn, NGTD - MRSA screen is negative - Continue to elevate legs. - Recommend outpatient follow up in wound clinic for ongoing observation - Per ID, consider amoxicillin 500mg  TID +/- bactrim x1 month then narrow to single agent for prophylaxis.   Morbid obesity: BMI is 84.52 kg/m. Pt has been to bariatric surgery information session and is contemplating going forward.  - Nutrition consult  Chronic HFpEF: EDW between 200-207kg per last DC summary. Received IVF's for sepsis initially, will have to monitor closely.  - On a Lasix 60 mg twice a day, switched to IV to ensure negative fluid balance.  Essential hypertension: Chronic, stable - Continue losartan, metoprolol  NIDDM: HbA1c 6.6% - Hold empagliflozin - SSI  Hypothyroidism: Chronic, stable. TSH 1.310 in Jan 2018.  - Continue synthroid 150 g daily  Obstructive sleep apnea: Chronic, stable - Continue CPAP  Hyperlipidemia: Chronic, stable - Continue statin.   DVT prophylaxis: Lovenox Code Status: Full Family Communication: None at bedside this AM.  Disposition Plan: Anticipate DC in a.m.  Consultants:   ID  Procedures:   None  Antimicrobials:  Vancomycin 5/20 > 5/22  Zosyn 5/20  Ceftriaxone 5/21  > 5/22  Ancef 5/22 >>     Objective: Vitals:   04/19/17 0900 04/19/17 1200 04/19/17 2050 04/20/17 0437  BP:  (!) 144/82  (!) 152/80  Pulse:  64 70 77  Resp:  20 20 20   Temp:  98.6 F (37 C) 98.1 F (36.7 C) 98.9 F (37.2 C)  TempSrc:  Oral Oral Oral  SpO2:  95% 94% 94%  Weight: (!) 209.2 kg (461 lb 4.8 oz)   Marland Kitchen)  210.1 kg (463 lb 3.2 oz)  Height:        Intake/Output Summary (Last 24 hours) at 04/20/17 1029 Last data filed at 04/20/17 2542  Gross per 24 hour  Intake             1215 ml  Output             2150 ml  Net             -935 ml   Filed Weights   04/19/17 7062 04/19/17 0900 04/20/17  0437  Weight: (!) 210.6 kg (464 lb 3.2 oz) (!) 209.2 kg (461 lb 4.8 oz) (!) 210.1 kg (463 lb 3.2 oz)    Examination: General exam: Pleasant, obese female in no distress  Respiratory system: Non-labored breathing room air. Clear to auscultation bilaterally.  Cardiovascular system: Regular rate and rhythm. No murmur, rub, or gallop. UTD JVD. Gastrointestinal system: Abdomen obese, soft, non-tender, non-distended, with normoactive bowel sounds. No organomegaly or masses felt. Central nervous system: Alert and oriented. No focal neurological deficits. Extremities: LLE with tender, blanching erythema in lower leg extending circumferentially limited above ankle and below knee with induration developing dependently. No fluctuance. RLE also with erythema without tenderness. No inguinal lymphadenopathy palpated.  Skin: As above. Psychiatry: Judgement and insight appear normal. Mood & affect appropriate.   Data Reviewed: I have personally reviewed following labs and imaging studies  CBC:  Recent Labs Lab 04/16/17 2351 04/17/17 0113 04/19/17 0419  WBC 10.9* 12.6* 4.6  NEUTROABS 9.3*  --   --   HGB 13.8 13.7 12.1  HCT 45.0 45.6 40.7  MCV 89.8 89.9 90.2  PLT 177 173 376   Basic Metabolic Panel:  Recent Labs Lab 04/16/17 2351 04/17/17 0113 04/19/17 0419  NA 136 136 137  K 4.0 3.8 3.6  CL 97* 96* 102  CO2 28 30 28   GLUCOSE 147* 143* 128*  BUN 15 16 13   CREATININE 1.16* 1.17* 0.98  CALCIUM 9.4 9.5 8.6*   GFR: Estimated Creatinine Clearance: 142.9 mL/min (by C-G formula based on SCr of 0.98 mg/dL). Liver Function Tests:  Recent Labs Lab 04/16/17 2351  AST 18  ALT 15  ALKPHOS 70  BILITOT 1.1  PROT 8.1  ALBUMIN 3.6   No results for input(s): LIPASE, AMYLASE in the last 168 hours. No results for input(s): AMMONIA in the last 168 hours. Coagulation Profile: No results for input(s): INR, PROTIME in the last 168 hours. Cardiac Enzymes: No results for input(s): CKTOTAL, CKMB,  CKMBINDEX, TROPONINI in the last 168 hours. BNP (last 3 results)  Recent Labs  08/30/16 1413  PROBNP 641.0*   HbA1C: No results for input(s): HGBA1C in the last 72 hours. CBG:  Recent Labs Lab 04/19/17 0723 04/19/17 1146 04/19/17 1623 04/19/17 2125 04/20/17 0747  GLUCAP 111* 121* 117* 97 120*   Lipid Profile: No results for input(s): CHOL, HDL, LDLCALC, TRIG, CHOLHDL, LDLDIRECT in the last 72 hours. Thyroid Function Tests: No results for input(s): TSH, T4TOTAL, FREET4, T3FREE, THYROIDAB in the last 72 hours. Anemia Panel: No results for input(s): VITAMINB12, FOLATE, FERRITIN, TIBC, IRON, RETICCTPCT in the last 72 hours. Urine analysis:    Component Value Date/Time   COLORURINE STRAW (A) 04/16/2017 0003   APPEARANCEUR CLEAR 04/16/2017 0003   LABSPEC 1.010 04/16/2017 0003   PHURINE 6.0 04/16/2017 0003   GLUCOSEU >=500 (A) 04/16/2017 0003   HGBUR SMALL (A) 04/16/2017 0003   BILIRUBINUR NEGATIVE 04/16/2017 0003   KETONESUR NEGATIVE 04/16/2017 0003  PROTEINUR 30 (A) 04/16/2017 0003   UROBILINOGEN 0.2 07/25/2014 1040   NITRITE NEGATIVE 04/16/2017 0003   LEUKOCYTESUR NEGATIVE 04/16/2017 0003   Recent Results (from the past 240 hour(s))  Blood Culture (routine x 2)     Status: None (Preliminary result)   Collection Time: 04/17/17 12:56 AM  Result Value Ref Range Status   Specimen Description BLOOD RIGHT ARM  Final   Special Requests IN PEDIATRIC BOTTLE Blood Culture adequate volume  Final   Culture NO GROWTH 2 DAYS  Final   Report Status PENDING  Incomplete  Blood Culture (routine x 2)     Status: None (Preliminary result)   Collection Time: 04/17/17  1:09 AM  Result Value Ref Range Status   Specimen Description BLOOD LEFT ANTECUBITAL  Final   Special Requests   Final    BOTTLES DRAWN AEROBIC AND ANAEROBIC Blood Culture adequate volume   Culture NO GROWTH 2 DAYS  Final   Report Status PENDING  Incomplete  MRSA PCR Screening     Status: None   Collection Time:  04/18/17  7:44 AM  Result Value Ref Range Status   MRSA by PCR NEGATIVE NEGATIVE Final    Comment:        The GeneXpert MRSA Assay (FDA approved for NASAL specimens only), is one component of a comprehensive MRSA colonization surveillance program. It is not intended to diagnose MRSA infection nor to guide or monitor treatment for MRSA infections.       Radiology Studies: No results found.  Scheduled Meds: . enoxaparin (LOVENOX) injection  100 mg Subcutaneous Q24H  . furosemide  60 mg Intravenous Q8H  . insulin aspart  0-15 Units Subcutaneous TID WC  . levothyroxine  150 mcg Oral QAC breakfast  . losartan  25 mg Oral Daily  . [START ON 04/21/2017] metolazone  2.5 mg Oral Q Fri  . metoprolol succinate  50 mg Oral Daily  . potassium chloride SA  40 mEq Oral Daily  . rosuvastatin  10 mg Oral Daily   Continuous Infusions: .  ceFAZolin (ANCEF) IV Stopped (04/20/17 7159)     LOS: 3 days   Time spent: 25 minutes.  Birdie Hopes, MD [facility] Pager [phone]  If 7PM-7AM, please contact night-coverage [email or web address] Password [facility] 04/20/2017, 10:29 AM

## 2017-04-20 NOTE — Progress Notes (Signed)
04/20/2017- Respiratory care note- Pt has home CPAP machine and is placing on self with no assistance.  Tolerating well.

## 2017-04-21 ENCOUNTER — Ambulatory Visit: Payer: BC Managed Care – PPO | Admitting: Family

## 2017-04-21 LAB — GLUCOSE, CAPILLARY
Glucose-Capillary: 131 mg/dL — ABNORMAL HIGH (ref 65–99)
Glucose-Capillary: 97 mg/dL (ref 65–99)

## 2017-04-21 MED ORDER — AMOXICILLIN 500 MG PO CAPS: 500.0000 mg | ORAL_CAPSULE | Freq: Three times a day (TID) | ORAL | 0 refills | Status: DC

## 2017-04-21 MED ORDER — FUROSEMIDE 80 MG PO TABS: 80.0000 mg | ORAL_TABLET | Freq: Two times a day (BID) | ORAL | 1 refills | Status: DC

## 2017-04-21 MED ORDER — FUROSEMIDE 10 MG/ML IJ SOLN
60.0000 mg | Freq: Once | INTRAMUSCULAR | Status: AC
  Administered 2017-04-21: 60 mg via INTRAVENOUS
  Filled 2017-04-21: qty 6

## 2017-04-21 MED ORDER — SULFAMETHOXAZOLE-TRIMETHOPRIM 800-160 MG PO TABS: 1.0000 | ORAL_TABLET | Freq: Two times a day (BID) | ORAL | 0 refills | Status: DC

## 2017-04-21 NOTE — Discharge Summary (Signed)
Physician Discharge Summary  Alexandra Beck XFG:[id number] DOB: Mar 16, 1981 DOA: 04/16/2017  PCP: Golden Circle, FNP  Admit date: 04/16/2017 Discharge date: 04/21/2017  Admitted From: Home Disposition: Home  Recommendations for Outpatient Follow-up:  1. Follow up with PCP in 1-2 weeks 2. Please obtain BMP/CBC in one week  Home Health: NA Equipment/Devices:NA  Discharge Condition: Stable CODE STATUS: Full Code Diet recommendation: Diet Carb Modified Fluid consistency: Thin; Room service appropriate? Yes Diet - low sodium heart healthy  Brief/Interim Summary: Alexandra Beck is a 36 y.o. female with a history of morbid obesity, OSA, venous insufficiency, chronic HFpEF, DM (last HbA1c 6.6%), hypothyroidism, and recurrent cellulitis who presented with left leg redness, pain, and fever.   She had been discharged 5/3 for RLE cellulitis, her 4th hospitalization for cellulitis, treated with vancomycin and zosyn, narrowed to ceftriaxone and discharged on keflex. She completed this and started clindamycin which caused belching/upper abdominal discomfort, so was started on penicillin for prophylaxis 4 days prior to developing abrupt onset of left lower leg redness, swelling, tenderness, and fever to 103.40F . She took a tylenol and presented to the ED where she was febrile to 102.50F, tachycardic, with a leukocytosis to 10.6, lactate elevation to 2.97. IV fluids, vancomycin and zosyn started in ED, transitioned to vancomycin, ceftriaxone overnight. Sepsis has improved. ID recommended transition to ancef alone.   Discharge Diagnoses:  Principal Problem:   Sepsis, unspecified organism [facility]) Active Problems:   Essential hypertension   Recurrent cellulitis of lower leg   Obesity hypoventilation syndrome (HCC)   Diabetes mellitus type 2 in obese (HCC)   Chronic diastolic CHF (congestive heart failure) (HCC)   Cellulitis of left leg   Sepsis due to left lower extremity non-purulent cellulitis:   Had fever, tachycardia, leukocytosis, lactate elevation at admission. Has recurrent cellulitis, and RLE still appears erythematous. HIV NR at recent hospitalization.  - Blood cultures drawn, NGTD - MRSA screen is negative - Treated with cefazolin while in the hospital. - Discharged on amoxicillin 500 mg TID for a month, Bactrim DS BID for 2 more weeks. - To follow-up with Dr. Graylon Good in 2 weeks  Chronic lower extremity swelling -Initial recommendation was for Unna boots, does not have any open ulcers. -She is on oral Lasix at home, Initiated IV diuresis, swelling went down tremendously. -Hold on Unna boots, increased Lasix, recommended to continue compression stockings elevation of leg at night. -Follow-up with ID and orthopedics as outpatient if leg swelling is resistant then she might benefit from [name].  Morbid obesity: BMI is 84.52 kg/m. Pt has been to bariatric surgery information session and is contemplating going forward.   Chronic HFpEF: EDW between 200-207kg per last DC summary. Received IVF's for sepsis initially, will have to monitor closely.  -Received IV Lasix while she is in the hospital, leg swelling improved. -Discharged on Lasix 80 mg twice a day.  Essential hypertension: Chronic, stable - Continue losartan, metoprolol  NIDDM: HbA1c 6.6% - Hold empagliflozin - SSI  Hypothyroidism: Chronic, stable. TSH 1.310 in Jan 2018.  - Continue synthroid 150 g daily  Obstructive sleep apnea: Chronic, stable - Continue CPAP  Hyperlipidemia: Chronic, stable - Continue statin.   Discharge Instructions  Discharge Instructions    Diet - low sodium heart healthy    Complete by:  As directed    Increase activity slowly    Complete by:  As directed      Allergies as of 04/21/2017      Reactions  Aspirin Hives, Swelling   REACTION: throat swelling, hives   Doxycycline Other (See Comments)   Abdominal pain   Lisinopril Cough   Niaspan [niacin Er]     Caused flushing      Medication List    TAKE these medications   acetaminophen 325 MG tablet Commonly known as:  TYLENOL Take 650 mg by mouth every 6 (six) hours as needed for fever.   amoxicillin 500 MG capsule Commonly known as:  AMOXIL Take 1 capsule (500 mg total) by mouth 3 (three) times daily.   empagliflozin 10 MG Tabs tablet Commonly known as:  JARDIANCE Take 10 mg by mouth daily. Follow-up appt is due must make appt for future refills   furosemide 80 MG tablet Commonly known as:  LASIX Take 1 tablet (80 mg total) by mouth 2 (two) times daily. What changed:  medication strength  how much to take   levothyroxine 150 MCG tablet Commonly known as:  SYNTHROID, LEVOTHROID Take 1 tablet (150 mcg total) by mouth daily before breakfast. Follow-up appt is due must make appt for future refills   losartan 25 MG tablet Commonly known as:  COZAAR Take 25 mg by mouth daily.   metolazone 2.5 MG tablet Commonly known as:  ZAROXOLYN Take 1 tablet by mouth 1 time a week on Fridays before taking lasix   metoprolol succinate 50 MG 24 hr tablet Commonly known as:  TOPROL-XL Take 1 tablet (50 mg total) by mouth daily. Follow-up appt is due must make appt for future refills   potassium chloride SA 20 MEQ tablet Commonly known as:  K-DUR,KLOR-CON Take 2 tablets (40 mEq total) by mouth daily.   rosuvastatin 10 MG tablet Commonly known as:  CRESTOR Take 1 tablet (10 mg total) by mouth daily.   sulfamethoxazole-trimethoprim 800-160 MG tablet Commonly known as:  BACTRIM DS,SEPTRA DS Take 1 tablet by mouth 2 (two) times daily.      Follow-up Information    Carlyle Basques, MD Follow up.   Specialty:  Infectious Diseases Contact information: [address] [place] [address] [phone]          Allergies  Allergen Reactions  . Aspirin Hives and Swelling    REACTION: throat swelling, hives  . Doxycycline Other (See Comments)    Abdominal pain  .  Lisinopril Cough  . Niaspan [Niacin Er]     Caused flushing    Consultations:  ID  Procedures (Echo, Carotid, EGD, Colonoscopy, ERCP)   Radiological studies: Ir US Guide Vasc Access Right  Result Date: 03/28/2017 INDICATION: 36 year old female with a history of right leg cellulitis. EXAM: PICC LINE PLACEMENT WITH ULTRASOUND AND FLUOROSCOPIC GUIDANCE MEDICATIONS: None ANESTHESIA/SEDATION: None . FLUOROSCOPY TIME:  Fluoroscopy Time: 0 minutes 12 seconds (6.3 mGy). COMPLICATIONS: None PROCEDURE: The patient was advised of the possible risks and complications and agreed to undergo the procedure. The patient was then brought to the angiographic suite for the procedure. The right arm was prepped with chlorhexidine, draped in the usual sterile fashion using maximum barrier technique (cap and mask, sterile gown, sterile gloves, large sterile sheet, hand hygiene and cutaneous antisepsis) and infiltrated locally with 1% Lidocaine. Ultrasound demonstrated patency of the right basilic vein, and this was documented with an image. Under real-time ultrasound guidance, this vein was accessed with a 21 gauge micropuncture needle and image documentation was performed. A 0.018 wire was introduced in to the vein. Over this, a 5 French double-lumen lumen power injectable PICC was advanced to  the lower SVC/right atrial junction. Fluoroscopy during the procedure and fluoro spot radiograph confirms appropriate catheter position. The catheter was flushed and covered with asterile dressing. Catheter length: 49 cm IMPRESSION: Status post right arm basilic vein double-lumen power injectable PICC measuring 49 cm. Catheter ready for use. Electronically Signed   By: Corrie Mckusick D.O.   On: 03/28/2017 09:14   Ir Fluoro Guide Cv Midline Picc Right  Result Date: 03/28/2017 INDICATION: 36 year old female with a history of right leg cellulitis. EXAM: PICC LINE PLACEMENT WITH ULTRASOUND AND FLUOROSCOPIC GUIDANCE MEDICATIONS: None  ANESTHESIA/SEDATION: None . FLUOROSCOPY TIME:  Fluoroscopy Time: 0 minutes 12 seconds (6.3 mGy). COMPLICATIONS: None PROCEDURE: The patient was advised of the possible risks and complications and agreed to undergo the procedure. The patient was then brought to the angiographic suite for the procedure. The right arm was prepped with chlorhexidine, draped in the usual sterile fashion using maximum barrier technique (cap and mask, sterile gown, sterile gloves, large sterile sheet, hand hygiene and cutaneous antisepsis) and infiltrated locally with 1% Lidocaine. Ultrasound demonstrated patency of the right basilic vein, and this was documented with an image. Under real-time ultrasound guidance, this vein was accessed with a 21 gauge micropuncture needle and image documentation was performed. A 0.018 wire was introduced in to the vein. Over this, a 5 French double-lumen lumen power injectable PICC was advanced to the lower SVC/right atrial junction. Fluoroscopy during the procedure and fluoro spot radiograph confirms appropriate catheter position. The catheter was flushed and covered with asterile dressing. Catheter length: 49 cm IMPRESSION: Status post right arm basilic vein double-lumen power injectable PICC measuring 49 cm. Catheter ready for use. Electronically Signed   By: Corrie Mckusick D.O.   On: 03/28/2017 09:14     Subjective:  Discharge Exam: Vitals:   04/20/17 0437 04/20/17 2032 04/21/17 0340 04/21/17 0945  BP: (!) 152/80 140/65 (!) 152/69 (!) 144/83  Pulse: 77 61 68 64  Resp: 20 20 20    Temp: 98.9 F (37.2 C) 98.2 F (36.8 C) 98.4 F (36.9 C)   TempSrc: Oral Oral Oral   SpO2: 94% 96% 100%   Weight: (!) 210.1 kg (463 lb 3.2 oz)  (!) 206.7 kg (455 lb 9.6 oz)   Height:       General: Pt is alert, awake, not in acute distress Cardiovascular: RRR, S1/S2 +, no rubs, no gallops Respiratory: CTA bilaterally, no wheezing, no rhonchi Abdominal: Soft, NT, ND, bowel sounds + Extremities: no edema,  no cyanosis   The results of significant diagnostics from this hospitalization (including imaging, microbiology, ancillary and laboratory) are listed below for reference.    Microbiology: Recent Results (from the past 240 hour(s))  Blood Culture (routine x 2)     Status: None (Preliminary result)   Collection Time: 04/17/17 12:56 AM  Result Value Ref Range Status   Specimen Description BLOOD RIGHT ARM  Final   Special Requests IN PEDIATRIC BOTTLE Blood Culture adequate volume  Final   Culture NO GROWTH 3 DAYS  Final   Report Status PENDING  Incomplete  Blood Culture (routine x 2)     Status: None (Preliminary result)   Collection Time: 04/17/17  1:09 AM  Result Value Ref Range Status   Specimen Description BLOOD LEFT ANTECUBITAL  Final   Special Requests   Final    BOTTLES DRAWN AEROBIC AND ANAEROBIC Blood Culture adequate volume   Culture NO GROWTH 3 DAYS  Final   Report Status PENDING  Incomplete  MRSA PCR  Screening     Status: None   Collection Time: 04/18/17  7:44 AM  Result Value Ref Range Status   MRSA by PCR NEGATIVE NEGATIVE Final    Comment:        The GeneXpert MRSA Assay (FDA approved for NASAL specimens only), is one component of a comprehensive MRSA colonization surveillance program. It is not intended to diagnose MRSA infection nor to guide or monitor treatment for MRSA infections.      Labs: BNP (last 3 results) No results for input(s): BNP in the last 8760 hours. Basic Metabolic Panel:  Recent Labs Lab 04/16/17 2351 04/17/17 0113 04/19/17 0419  NA 136 136 137  K 4.0 3.8 3.6  CL 97* 96* 102  CO2 28 30 28   GLUCOSE 147* 143* 128*  BUN 15 16 13   CREATININE 1.16* 1.17* 0.98  CALCIUM 9.4 9.5 8.6*   Liver Function Tests:  Recent Labs Lab 04/16/17 2351  AST 18  ALT 15  ALKPHOS 70  BILITOT 1.1  PROT 8.1  ALBUMIN 3.6   No results for input(s): LIPASE, AMYLASE in the last 168 hours. No results for input(s): AMMONIA in the last 168  hours. CBC:  Recent Labs Lab 04/16/17 2351 04/17/17 0113 04/19/17 0419  WBC 10.9* 12.6* 4.6  NEUTROABS 9.3*  --   --   HGB 13.8 13.7 12.1  HCT 45.0 45.6 40.7  MCV 89.8 89.9 90.2  PLT 177 173 151   Cardiac Enzymes: No results for input(s): CKTOTAL, CKMB, CKMBINDEX, TROPONINI in the last 168 hours. BNP: Invalid input(s): POCBNP CBG:  Recent Labs Lab 04/20/17 0747 04/20/17 1123 04/20/17 1705 04/20/17 2048 04/21/17 0813  GLUCAP 120* 115* 131* 113* 131*   D-Dimer No results for input(s): DDIMER in the last 72 hours. Hgb A1c No results for input(s): HGBA1C in the last 72 hours. Lipid Profile No results for input(s): CHOL, HDL, LDLCALC, TRIG, CHOLHDL, LDLDIRECT in the last 72 hours. Thyroid function studies No results for input(s): TSH, T4TOTAL, T3FREE, THYROIDAB in the last 72 hours.  Invalid input(s): FREET3 Anemia work up No results for input(s): VITAMINB12, FOLATE, FERRITIN, TIBC, IRON, RETICCTPCT in the last 72 hours. Urinalysis    Component Value Date/Time   COLORURINE STRAW (A) 04/16/2017 0003   APPEARANCEUR CLEAR 04/16/2017 0003   LABSPEC 1.010 04/16/2017 0003   PHURINE 6.0 04/16/2017 0003   GLUCOSEU >=500 (A) 04/16/2017 0003   HGBUR SMALL (A) 04/16/2017 0003   BILIRUBINUR NEGATIVE 04/16/2017 0003   KETONESUR NEGATIVE 04/16/2017 0003   PROTEINUR 30 (A) 04/16/2017 0003   UROBILINOGEN 0.2 07/25/2014 1040   NITRITE NEGATIVE 04/16/2017 0003   LEUKOCYTESUR NEGATIVE 04/16/2017 0003   Sepsis Labs Invalid input(s): PROCALCITONIN,  WBC,  LACTICIDVEN Microbiology Recent Results (from the past 240 hour(s))  Blood Culture (routine x 2)     Status: None (Preliminary result)   Collection Time: 04/17/17 12:56 AM  Result Value Ref Range Status   Specimen Description BLOOD RIGHT ARM  Final   Special Requests IN PEDIATRIC BOTTLE Blood Culture adequate volume  Final   Culture NO GROWTH 3 DAYS  Final   Report Status PENDING  Incomplete  Blood Culture (routine x 2)      Status: None (Preliminary result)   Collection Time: 04/17/17  1:09 AM  Result Value Ref Range Status   Specimen Description BLOOD LEFT ANTECUBITAL  Final   Special Requests   Final    BOTTLES DRAWN AEROBIC AND ANAEROBIC Blood Culture adequate volume   Culture NO GROWTH  3 DAYS  Final   Report Status PENDING  Incomplete  MRSA PCR Screening     Status: None   Collection Time: 04/18/17  7:44 AM  Result Value Ref Range Status   MRSA by PCR NEGATIVE NEGATIVE Final    Comment:        The GeneXpert MRSA Assay (FDA approved for NASAL specimens only), is one component of a comprehensive MRSA colonization surveillance program. It is not intended to diagnose MRSA infection nor to guide or monitor treatment for MRSA infections.      Time coordinating discharge: Over 30 minutes  SIGNED:   Birdie Hopes, MD  [facility] 04/21/2017, 10:09 AM Pager   If 7PM-7AM, please contact night-coverage [email or web address] Password [email or web address]

## 2017-04-21 NOTE — Progress Notes (Signed)
Pharmacy Antibiotic Note  Alexandra Beck is a 36 y.o. female admitted on 04/16/2017 with recurrent cellulitis.  Pharmacy has been consulted to dose Ancef per ID recs. Noted plans for prophylaxis with amoxicillin -WBC= 4.6, afebrile, SCr= 0.98 and CrCl > 100  Plan: Continue Ancef 2g IV Q8H. Consider defining a stop date for ancef Will sign off. Please contact pharmacy with any other needs.  Thank you Hildred Laser, Pharm D 04/21/2017 8:00 AM     Height: 5\' 2"  (157.5 cm) Weight: (!) 455 lb 9.6 oz (206.7 kg) IBW/kg (Calculated) : 50.1  Temp (24hrs), Avg:98.3 F (36.8 C), Min:98.2 F (36.8 C), Max:98.4 F (36.9 C)   Recent Labs Lab 04/16/17 2351 04/17/17 0014 04/17/17 0113 04/17/17 0321 04/17/17 0623 04/17/17 0819 04/19/17 0419  WBC 10.9*  --  12.6*  --   --   --  4.6  CREATININE 1.16*  --  1.17*  --   --   --  0.98  LATICACIDVEN  --  2.97*  --  3.47* 1.8 1.3  --     Estimated Creatinine Clearance: 141.2 mL/min (by C-G formula based on SCr of 0.98 mg/dL).    Allergies  Allergen Reactions  . Aspirin Hives and Swelling    REACTION: throat swelling, hives  . Doxycycline Other (See Comments)    Abdominal pain  . Lisinopril Cough  . Niaspan [Niacin Er]     Caused flushing    Antimicrobials this admission: 5/20 Zosyn 5/20 x1 Vanc 5/20 >> 5/22 Rocephin 5/20 >> 5/22 Ancef 5/22 >>   Microbiology results: 5/21 BCx x2: - ngtd  Thank you for allowing pharmacy to be a part of this patient's care.  Hildred Laser, Pharm D 04/21/2017 8:00 AM

## 2017-04-22 LAB — CULTURE, BLOOD (ROUTINE X 2)
CULTURE: NO GROWTH
Culture: NO GROWTH
SPECIAL REQUESTS: ADEQUATE
SPECIAL REQUESTS: ADEQUATE

## 2017-04-28 ENCOUNTER — Other Ambulatory Visit: Payer: Self-pay | Admitting: Family

## 2017-04-28 DIAGNOSIS — E039 Hypothyroidism, unspecified: Secondary | ICD-10-CM

## 2017-05-05 ENCOUNTER — Ambulatory Visit (INDEPENDENT_AMBULATORY_CARE_PROVIDER_SITE_OTHER): Payer: BC Managed Care – PPO | Admitting: Family

## 2017-05-05 ENCOUNTER — Encounter: Payer: Self-pay | Admitting: Family

## 2017-05-05 ENCOUNTER — Inpatient Hospital Stay: Payer: BC Managed Care – PPO | Admitting: Family

## 2017-05-05 DIAGNOSIS — I5032 Chronic diastolic (congestive) heart failure: Secondary | ICD-10-CM

## 2017-05-05 DIAGNOSIS — L03116 Cellulitis of left lower limb: Secondary | ICD-10-CM

## 2017-05-05 DIAGNOSIS — E039 Hypothyroidism, unspecified: Secondary | ICD-10-CM

## 2017-05-05 MED ORDER — ROSUVASTATIN CALCIUM 10 MG PO TABS: 10.0000 mg | ORAL_TABLET | Freq: Every day | ORAL | 3 refills | Status: DC

## 2017-05-05 MED ORDER — LEVOTHYROXINE SODIUM 150 MCG PO TABS: ORAL_TABLET | ORAL | 3 refills | Status: DC

## 2017-05-05 MED ORDER — METOPROLOL SUCCINATE ER 50 MG PO TB24: ORAL_TABLET | ORAL | 3 refills | Status: DC

## 2017-05-05 MED ORDER — LOSARTAN POTASSIUM 25 MG PO TABS: 25.0000 mg | ORAL_TABLET | Freq: Every day | ORAL | 3 refills | Status: DC

## 2017-05-05 MED ORDER — EMPAGLIFLOZIN 10 MG PO TABS
ORAL_TABLET | ORAL | 3 refills | Status: DC
Start: 2017-05-05 — End: 2018-04-05

## 2017-05-05 NOTE — Assessment & Plan Note (Signed)
Appears euvolemic with no current shortness of breath or chest pain. Continue furosemide at 80 mg twice daily.

## 2017-05-05 NOTE — Progress Notes (Signed)
Subjective:    Patient ID: Alexandra Beck, female    DOB: [birth year], 36 y.o.   MRN: [id number]  Chief Complaint  Patient presents with  . Hospitalization Follow-up    Cellulitis, FMLA papers that will be faxed    HPI:  Alexandra Beck is a 36 y.o. female who  has a past medical history of Acute renal failure (ARF) ([place]) (11/2012); Anemia; Chronic diastolic CHF (congestive heart failure) ([place]); Depression; Diabetes mellitus ([place]); GERD (gastroesophageal reflux disease); Hyperlipidemia; Hypertension; Hypothyroidism; Morbid obesity with BMI of 70 and over, adult ([place]); Obesity hypoventilation syndrome ([place]); OSA (obstructive sleep apnea); PVC's (premature ventricular contractions); Recurrent cellulitis of lower leg; Sepsis ([place]) (11/2012); and Venous insufficiency of leg. and presents today   Recently evaluated in the emergency department the chief complaint of recurrent cellulitis noted increase in general body chills and warmth to the left leg. She was noted to have a temperature at home approximately 103.6. Physical exam with tachycardia and warmth and erythema to the left lower extremity over the shin and calf with no tenderness to palpation. White blood cell slightly elevated 10.9 and lactic acid of 2.97. Sepsis workup was initiated and was started on vancomycin and Zosyn. She was admitted for further workup and antibiotic therapy. Diagnosed with sepsis secondary to lower extremity nonpurulent cellulitis with blood cultures drawn and no growth during hospitalization and MRSA screen negative. She was treated with cefazolin while in the hospital. Discharged on amoxicillin 500 mg 3 times daily for 1 month and Bactrim twice daily for 2 weeks. Advised to follow-up with Dr. Graylon Good of infectious disease after discharge. Chronic lower extremity swelling was treated with Lasix and IV diuresis with significant improvement. Unna boot was held with the recommendation medical therapy. Advised to follow-up with  infectious disease and orthopedics as outpatient if swelling is resistant which may benefit from [employer]. She also contemplated bariatric surgery if symptoms do not improve. Hospitalist recommended basic metabolic profile and CBC for follow-up. All hospital records, labs, and imaging reviewed in detail.  Since leaving the hospital she reports no increases in swelling with mild discoloration. Reports taking the amoxicillin three times daily and the Bactrim as prescribed and denies adverse side effects. Has yet to follow up with Infectious disease and will call to make an appointment. No fevers. Legs are feeling good. Also reports taking the furosemide twice daily. Has been elevating her legs when seated.   Lab Results  Component Value Date   HGBA1C 6.6 (H) 03/28/2017      Allergies  Allergen Reactions  . Aspirin Hives and Swelling    REACTION: throat swelling, hives  . Doxycycline Other (See Comments)    Abdominal pain  . Lisinopril Cough  . Niaspan [Niacin Er]     Caused flushing      Outpatient Medications Prior to Visit  Medication Sig Dispense Refill  . furosemide (LASIX) 80 MG tablet Take 1 tablet (80 mg total) by mouth 2 (two) times daily. 60 tablet 1  . metolazone (ZAROXOLYN) 2.5 MG tablet Take 1 tablet by mouth 1 time a week on Fridays before taking lasix 10 tablet 0  . potassium chloride SA (K-DUR,KLOR-CON) 20 MEQ tablet Take 2 tablets (40 mEq total) by mouth daily. 60 tablet 11  . sulfamethoxazole-trimethoprim (BACTRIM DS,SEPTRA DS) 800-160 MG tablet Take 1 tablet by mouth 2 (two) times daily. 30 tablet 0  . acetaminophen (TYLENOL) 325 MG tablet Take 650 mg by mouth every 6 (six) hours as needed for  fever.    . amoxicillin (AMOXIL) 500 MG capsule Take 1 capsule (500 mg total) by mouth 3 (three) times daily. 90 capsule 0  . JARDIANCE 10 MG TABS tablet TAKE 1 TABLET BY MOUTH DAILY. FOLLOW-UP APPOINTMENT IS DUE 30 tablet 0  . levothyroxine (SYNTHROID, LEVOTHROID) 150 MCG  tablet TAKE 1 TABLET(150 MCG) BY MOUTH DAILY BEFORE BREAKFAST. FOLLOW-UP APPOINTMENT IS DUE 30 tablet 0  . losartan (COZAAR) 25 MG tablet Take 25 mg by mouth daily.    . metoprolol succinate (TOPROL-XL) 50 MG 24 hr tablet TAKE 1 TABLET(50 MG) BY MOUTH DAILY. FOLLOW-UP APPOINTMENT IS DUE 30 tablet 0  . rosuvastatin (CRESTOR) 10 MG tablet Take 1 tablet (10 mg total) by mouth daily. 90 tablet 1   No facility-administered medications prior to visit.       Past Surgical History:  Procedure Laterality Date  . IR FLUORO GUIDE CV MIDLINE PICC RIGHT  03/28/2017  . IR US GUIDE VASC ACCESS RIGHT  03/28/2017  . WISDOM TOOTH EXTRACTION        Past Medical History:  Diagnosis Date  . Acute renal failure (ARF) ([place]) 11/2012    multifactorial-likely secondary to ATN in the setting of sepsis and hypotension, and also from rhabdomyolysis  . Anemia   . Chronic diastolic CHF (congestive heart failure) ([place])   . Depression   . Diabetes mellitus ([place])   . GERD (gastroesophageal reflux disease)   . Hyperlipidemia   . Hypertension   . Hypothyroidism   . Morbid obesity with BMI of 70 and over, adult (Alexandra Beck)   . Obesity hypoventilation syndrome ([place])   . OSA (obstructive sleep apnea)   . PVC's (premature ventricular contractions)   . Recurrent cellulitis of lower leg    LLE, venous insuff  . Sepsis ([place]) 11/2012   Secondary to cellulitis  . Venous insufficiency of leg       Review of Systems  Constitutional: Negative for chills and fever.  Respiratory: Negative for chest tightness and shortness of breath.   Cardiovascular: Positive for leg swelling. Negative for chest pain and palpitations.      Objective:    BP (!) 142/82 (BP Location: Left Arm, Patient Position: Sitting, Cuff Size: Large)   Pulse 63   Temp 98.9 F (37.2 C) (Oral)   Resp (!) 22   Ht 5\' 2"  (1.575 m)   Wt (!) 457 lb 9.6 oz (207.6 kg)   SpO2 95%   BMI 83.70 kg/m  Nursing note and vital signs reviewed.  Physical Exam    Constitutional: She is oriented to person, place, and time. She appears well-developed and well-nourished. No distress.  Cardiovascular: Normal rate, regular rhythm, normal heart sounds and intact distal pulses.   Mild/moderate lower extremity edema with no evidence of infection. Distal pulses and sensation are intact and appropriate.   Pulmonary/Chest: Effort normal and breath sounds normal.  Neurological: She is alert and oriented to person, place, and time.  Skin: Skin is warm and dry.  Psychiatric: She has a normal mood and affect. Her behavior is normal. Judgment and thought content normal.       Assessment & Plan:   Problem List Items Addressed This Visit      Cardiovascular and Mediastinum   Chronic diastolic CHF (congestive heart failure) ([facility]) (Chronic)    Appears euvolemic with no current shortness of breath or chest pain. Continue furosemide at 80 mg twice daily.       Relevant Medications   losartan (COZAAR) 25  MG tablet   metoprolol succinate (TOPROL-XL) 50 MG 24 hr tablet   rosuvastatin (CRESTOR) 10 MG tablet     Endocrine   Hypothyroidism   Relevant Medications   levothyroxine (SYNTHROID, LEVOTHROID) 150 MCG tablet   metoprolol succinate (TOPROL-XL) 50 MG 24 hr tablet     Other   Cellulitis of left leg    Recurrent cellulitis improved with diuresis and suppressed with Amoxicillin 3 times daily for 1 month and completion of Bactrim. No current evidence of infection or need for Unna boot at this time. Continue current dosage of Lasix and complete antibiotic course with follow up and changes ID. Continue to monitor for weight gain and fluid retention.           I have discontinued Ms. Cahoon acetaminophen. I have changed her JARDIANCE to empagliflozin. I have also changed her losartan, levothyroxine, and metoprolol succinate. Additionally, I am having her maintain her metolazone, potassium chloride SA, furosemide, sulfamethoxazole-trimethoprim, amoxicillin, and  rosuvastatin.   Meds ordered this encounter  Medications  . amoxicillin (AMOXIL) 500 MG tablet    Sig: Take 500 mg by mouth 3 (three) times daily.  . empagliflozin (JARDIANCE) 10 MG TABS tablet    Sig: TAKE 1 TABLET BY MOUTH DAILY.    Dispense:  90 tablet    Refill:  3    Order Specific Question:   Supervising Provider    Answer:   Pricilla Holm A [[id number]]  . losartan (COZAAR) 25 MG tablet    Sig: Take 1 tablet (25 mg total) by mouth daily.    Dispense:  90 tablet    Refill:  3    Order Specific Question:   Supervising Provider    Answer:   Pricilla Holm A [[id number]]  . levothyroxine (SYNTHROID, LEVOTHROID) 150 MCG tablet    Sig: TAKE 1 TABLET(150 MCG) BY MOUTH DAILY BEFORE BREAKFAST.    Dispense:  90 tablet    Refill:  3    Order Specific Question:   Supervising Provider    Answer:   Pricilla Holm A [[id number]]  . metoprolol succinate (TOPROL-XL) 50 MG 24 hr tablet    Sig: TAKE 1 TABLET(50 MG) BY MOUTH DAILY.    Dispense:  90 tablet    Refill:  3    Order Specific Question:   Supervising Provider    Answer:   Pricilla Holm A [[id number]]  . rosuvastatin (CRESTOR) 10 MG tablet    Sig: Take 1 tablet (10 mg total) by mouth daily.    Dispense:  90 tablet    Refill:  3    Order Specific Question:   Supervising Provider    Answer:   Pricilla Holm A [[id number]]     Follow-up: Return in about 3 months (around 08/05/2017), or if symptoms worsen or fail to improve.  Mauricio Po, FNP

## 2017-05-05 NOTE — Patient Instructions (Signed)
Thank you for choosing [facility].  SUMMARY AND INSTRUCTIONS:  Please continue to take your medications as prescribed.  Follow up with Dr. Baxter Flattery for antibiotic therapy.   If you note any additional swelling please let us know.   Continue to elevate your legs when seated and follow a low sodium diet.   Follow up:  If your symptoms worsen or fail to improve, please contact our office for further instruction, or in case of emergency go directly to the emergency room at the closest medical facility.

## 2017-05-05 NOTE — Assessment & Plan Note (Signed)
Recurrent cellulitis improved with diuresis and suppressed with Amoxicillin 3 times daily for 1 month and completion of Bactrim. No current evidence of infection or need for Unna boot at this time. Continue current dosage of Lasix and complete antibiotic course with follow up and changes ID. Continue to monitor for weight gain and fluid retention.

## 2017-05-17 DIAGNOSIS — Z0279 Encounter for issue of other medical certificate: Secondary | ICD-10-CM

## 2017-05-18 ENCOUNTER — Telehealth: Payer: Self-pay | Admitting: Family

## 2017-05-18 ENCOUNTER — Ambulatory Visit (INDEPENDENT_AMBULATORY_CARE_PROVIDER_SITE_OTHER): Payer: BC Managed Care – PPO | Admitting: Internal Medicine

## 2017-05-18 VITALS — Wt >= 6400 oz

## 2017-05-18 DIAGNOSIS — L03115 Cellulitis of right lower limb: Secondary | ICD-10-CM | POA: Diagnosis not present

## 2017-05-18 DIAGNOSIS — L03116 Cellulitis of left lower limb: Secondary | ICD-10-CM

## 2017-05-18 DIAGNOSIS — L03119 Cellulitis of unspecified part of limb: Secondary | ICD-10-CM

## 2017-05-18 MED ORDER — CEPHALEXIN 500 MG PO CAPS: 500.0000 mg | ORAL_CAPSULE | Freq: Four times a day (QID) | ORAL | 2 refills | Status: DC

## 2017-05-18 MED ORDER — AMOXICILLIN 500 MG PO TABS: 500.0000 mg | ORAL_TABLET | Freq: Two times a day (BID) | ORAL | 3 refills | Status: DC

## 2017-05-18 NOTE — Assessment & Plan Note (Signed)
This has resolved.  Doing well now.

## 2017-05-18 NOTE — Telephone Encounter (Signed)
Pt called regarding her FMLA, she states there are a couple blanks that should be filled out, the part that says the description of her condition is blank and the date of the start of condition is blank she states it should be 2011, and also for her condition she has to go to the hospital for treatments and the impatient care box is not checked. She would like this corrected and sent back please,

## 2017-05-18 NOTE — Assessment & Plan Note (Signed)
I will give her a prescription for keflex as needed for new onset cellulitis.  I will start her on twice a day amoxicillin for at least 3 months and can reevaluate after that.  25 minutes spent with the patient including 15 minutes of face to face counseling on plan.

## 2017-05-18 NOTE — Progress Notes (Signed)
   Subjective:    Patient ID: Alexandra Beck, female    DOB: 08-Aug-1981, 36 y.o.   MRN: [id number]  HPI Here for follow up of celllulitis.  She was hospitalized for cellulitis which has been a recurrent issue.  This has reoccurred due to venous stasis, obesity.  She was treated for non purulent cellulitis with cefaozlin and sent out on 2 weeks of Bactrim and 1 month of amoxicillin.  She was recommended to continue on suppressive therapy after that.  She is doing well now on three times a day amoxicillin but worried about another recurrence.  No fever, no chills.  Wearing stockings.     Review of Systems  Constitutional: Negative for chills and fever.  Gastrointestinal: Negative for diarrhea.  Skin: Negative for rash.       Objective:   Physical Exam  Constitutional: She appears well-developed and well-nourished.  Morbidly obese  Skin: No rash noted.          Assessment & Plan:

## 2017-05-19 NOTE — Telephone Encounter (Signed)
I have called patient and left a VM for her to call back. Needing to understand what the issue is.

## 2017-05-19 NOTE — Telephone Encounter (Signed)
I have the forms. I will give to Marya Amsler to make the changes.

## 2017-05-19 NOTE — Telephone Encounter (Signed)
I spoke with patient. Cellulitis need to be put on the paper for the reason for the flare up. This has been added and faxed back.

## 2017-07-19 ENCOUNTER — Encounter: Payer: Self-pay | Admitting: Family

## 2017-07-19 MED ORDER — FUROSEMIDE 80 MG PO TABS: 80.0000 mg | ORAL_TABLET | Freq: Two times a day (BID) | ORAL | 1 refills | Status: DC

## 2017-08-20 ENCOUNTER — Encounter: Payer: Self-pay | Admitting: Family

## 2017-08-21 ENCOUNTER — Encounter: Payer: Self-pay | Admitting: Family

## 2017-08-22 ENCOUNTER — Ambulatory Visit: Payer: BC Managed Care – PPO | Admitting: Internal Medicine

## 2017-08-22 MED ORDER — FLUCONAZOLE 150 MG PO TABS: ORAL_TABLET | ORAL | 0 refills | Status: DC

## 2017-08-22 MED ORDER — TRAMADOL HCL 50 MG PO TABS: 50.0000 mg | ORAL_TABLET | Freq: Three times a day (TID) | ORAL | 0 refills | Status: DC | PRN

## 2017-08-31 ENCOUNTER — Ambulatory Visit: Payer: BC Managed Care – PPO | Admitting: Internal Medicine

## 2017-09-04 ENCOUNTER — Ambulatory Visit: Payer: BC Managed Care – PPO | Admitting: Internal Medicine

## 2017-09-17 ENCOUNTER — Other Ambulatory Visit: Payer: Self-pay | Admitting: Internal Medicine

## 2017-09-17 ENCOUNTER — Other Ambulatory Visit: Payer: Self-pay | Admitting: Family

## 2017-09-17 DIAGNOSIS — L03119 Cellulitis of unspecified part of limb: Secondary | ICD-10-CM

## 2017-09-17 DIAGNOSIS — L03115 Cellulitis of right lower limb: Secondary | ICD-10-CM

## 2017-09-17 DIAGNOSIS — L03116 Cellulitis of left lower limb: Secondary | ICD-10-CM

## 2017-10-11 ENCOUNTER — Ambulatory Visit (INDEPENDENT_AMBULATORY_CARE_PROVIDER_SITE_OTHER): Payer: BC Managed Care – PPO | Admitting: Internal Medicine

## 2017-10-11 ENCOUNTER — Encounter: Payer: Self-pay | Admitting: Internal Medicine

## 2017-10-11 DIAGNOSIS — Z23 Encounter for immunization: Secondary | ICD-10-CM

## 2017-10-11 DIAGNOSIS — L03115 Cellulitis of right lower limb: Secondary | ICD-10-CM

## 2017-10-11 DIAGNOSIS — L03116 Cellulitis of left lower limb: Secondary | ICD-10-CM

## 2017-10-11 DIAGNOSIS — L03119 Cellulitis of unspecified part of limb: Secondary | ICD-10-CM | POA: Diagnosis not present

## 2017-10-11 MED ORDER — AMOXICILLIN 500 MG PO CAPS: 500.0000 mg | ORAL_CAPSULE | Freq: Two times a day (BID) | ORAL | 11 refills | Status: DC

## 2017-10-11 NOTE — Progress Notes (Signed)
RFV: recurrent cellulitis of lower extremities  Patient ID: Alexandra Beck, female   DOB: 05/26/1981, 36 y.o.   MRN: [id number]  HPI She has had repeated hospitalization for acute onset of cellulitis of lower extremities. Since her last admission, we had seen her in our clinic to place her on chronic suppression with amox bid and doing great. No further episodes of cellulitis since being on chronic suppression. Tolerating abtx without difficulty  Outpatient Encounter Medications as of 10/11/2017  Medication Sig  . amoxicillin (AMOXIL) 500 MG capsule TAKE 1 CAPSULE(500 MG) BY MOUTH TWICE DAILY  . empagliflozin (JARDIANCE) 10 MG TABS tablet TAKE 1 TABLET BY MOUTH DAILY.  . furosemide (LASIX) 80 MG tablet Take 1 tablet (80 mg total) by mouth 2 (two) times daily.  Marland Kitchen levothyroxine (SYNTHROID, LEVOTHROID) 150 MCG tablet TAKE 1 TABLET(150 MCG) BY MOUTH DAILY BEFORE BREAKFAST.  Marland Kitchen losartan (COZAAR) 25 MG tablet Take 1 tablet (25 mg total) by mouth daily.  . metoprolol succinate (TOPROL-XL) 50 MG 24 hr tablet TAKE 1 TABLET(50 MG) BY MOUTH DAILY.  Marland Kitchen potassium chloride SA (K-DUR,KLOR-CON) 20 MEQ tablet Take 2 tablets (40 mEq total) by mouth daily.  . rosuvastatin (CRESTOR) 10 MG tablet Take 1 tablet (10 mg total) by mouth daily.  . traMADol (ULTRAM) 50 MG tablet Take 1-2 tablets (50-100 mg total) by mouth every 8 (eight) hours as needed.  . cephALEXin (KEFLEX) 500 MG capsule Take 1 capsule (500 mg total) by mouth 4 (four) times daily. (Patient not taking: Reported on 10/11/2017)  . metolazone (ZAROXOLYN) 2.5 MG tablet Take 1 tablet by mouth 1 time a week on Fridays before taking lasix (Patient not taking: Reported on 10/11/2017)  . [DISCONTINUED] fluconazole (DIFLUCAN) 150 MG tablet Take 1 tablet by mouth and may repeat in 72 hours if needed.   No facility-administered encounter medications on file as of 10/11/2017.      Patient Active Problem List   Diagnosis Date Noted  . Cellulitis of left leg  04/17/2017  . Cellulitis of right leg 01/11/2017  . Abnormal EKG 12/16/2016  . PVC's (premature ventricular contractions) 12/02/2016  . Chronic diastolic CHF (congestive heart failure) ([place])   . Acute kidney injury ([place])   . Leg edema, left 06/10/2015  . Sepsis, unspecified organism ([place]) 07/23/2014  . Hyperlipidemia   . Diabetes mellitus type 2 in obese ([place])   . Hypothyroidism   . GERD (gastroesophageal reflux disease)   . Depression   . Venous insufficiency of leg   . Recurrent cellulitis of lower leg 12/25/2012  . OSA (obstructive sleep apnea) 12/25/2012  . Obesity hypoventilation syndrome ([place]) 12/25/2012  . Morbid obesity with BMI of 70 and over, adult ([place]) 12/24/2012  . Essential hypertension 06/26/2007     Health Maintenance Due  Topic Date Due  . OPHTHALMOLOGY EXAM  06/01/1990  . PAP SMEAR  06/01/2001  . FOOT EXAM  08/02/2014  . INFLUENZA VACCINE  06/28/2017  . HEMOGLOBIN A1C  09/28/2017     Review of Systems  Constitutional: Negative for fever, chills, diaphoresis, activity change, appetite change, fatigue and unexpected weight change.  HENT: Negative for congestion, sore throat, rhinorrhea, sneezing, trouble swallowing and sinus pressure.  Eyes: Negative for photophobia and visual disturbance.  Respiratory: Negative for cough, chest tightness, shortness of breath, wheezing and stridor.  Cardiovascular: Negative for chest pain, palpitations and leg swelling.  Gastrointestinal: Negative for nausea, vomiting, abdominal pain, diarrhea, constipation, blood in stool, abdominal distention and anal bleeding.  Genitourinary: Negative for  dysuria, hematuria, flank pain and difficulty urinating.  Musculoskeletal: Negative for myalgias, back pain, joint swelling, arthralgias and gait problem.  Skin: Negative for color change, pallor, rash and wound.  Neurological: Negative for dizziness, tremors, weakness and light-headedness.  Hematological: Negative for adenopathy. Does  not bruise/bleed easily.  Psychiatric/Behavioral: Negative for behavioral problems, confusion, sleep disturbance, dysphoric mood, decreased concentration and agitation.    Physical Exam   BP (!) 161/85   Pulse 71   Temp 98.7 F (37.1 C) (Oral)   Ht 5\' 2"  (1.575 m)   Wt (!) 476 lb (215.9 kg)   LMP 09/13/2017 (Approximate)   BMI 87.06 kg/m   Did not examine, wearing ted hose. CBC Lab Results  Component Value Date   WBC 4.6 04/19/2017   RBC 4.51 04/19/2017   HGB 12.1 04/19/2017   HCT 40.7 04/19/2017   PLT 151 04/19/2017   MCV 90.2 04/19/2017   MCH 26.8 04/19/2017   MCHC 29.7 (L) 04/19/2017   RDW 18.4 (H) 04/19/2017   LYMPHSABS 0.8 04/16/2017   MONOABS 0.7 04/16/2017   EOSABS 0.1 04/16/2017    BMET Lab Results  Component Value Date   NA 137 04/19/2017   K 3.6 04/19/2017   CL 102 04/19/2017   CO2 28 04/19/2017   GLUCOSE 128 (H) 04/19/2017   BUN 13 04/19/2017   CREATININE 0.98 04/19/2017   CALCIUM 8.6 (L) 04/19/2017   GFRNONAA >60 04/19/2017   GFRAA >60 04/19/2017      Assessment and Plan   Recurrent cellulitis = has been on chronic suppression for 4 months and doing well. Plan on continuing on amox but unclear if we should do a antibiotic holiday in the future and switch to pre-emptive treatment. She is hesistant to stop. For now plan to give amox for another year but would like to see her in 4-6 month  Health maintenance = will give flu shot

## 2017-12-29 ENCOUNTER — Encounter: Payer: Self-pay | Admitting: Family

## 2017-12-29 ENCOUNTER — Ambulatory Visit: Payer: BC Managed Care – PPO | Admitting: Family

## 2017-12-29 VITALS — BP 162/90 | HR 88 | Temp 98.4°F | Ht 62.0 in | Wt >= 6400 oz

## 2017-12-29 DIAGNOSIS — I5032 Chronic diastolic (congestive) heart failure: Secondary | ICD-10-CM | POA: Diagnosis not present

## 2017-12-29 DIAGNOSIS — R062 Wheezing: Secondary | ICD-10-CM | POA: Diagnosis not present

## 2017-12-29 DIAGNOSIS — E669 Obesity, unspecified: Secondary | ICD-10-CM | POA: Diagnosis not present

## 2017-12-29 DIAGNOSIS — E1169 Type 2 diabetes mellitus with other specified complication: Secondary | ICD-10-CM | POA: Diagnosis not present

## 2017-12-29 DIAGNOSIS — J209 Acute bronchitis, unspecified: Secondary | ICD-10-CM

## 2017-12-29 DIAGNOSIS — E039 Hypothyroidism, unspecified: Secondary | ICD-10-CM

## 2017-12-29 DIAGNOSIS — I10 Essential (primary) hypertension: Secondary | ICD-10-CM

## 2017-12-29 MED ORDER — ALBUTEROL SULFATE (2.5 MG/3ML) 0.083% IN NEBU
2.5000 mg | INHALATION_SOLUTION | Freq: Once | RESPIRATORY_TRACT | Status: AC
  Administered 2017-12-29: 2.5 mg via RESPIRATORY_TRACT

## 2017-12-29 MED ORDER — ALBUTEROL SULFATE HFA 108 (90 BASE) MCG/ACT IN AERS: 2.0000 | INHALATION_SPRAY | Freq: Four times a day (QID) | RESPIRATORY_TRACT | 0 refills | Status: DC | PRN

## 2017-12-29 MED ORDER — BENZONATATE 100 MG PO CAPS: 200.0000 mg | ORAL_CAPSULE | Freq: Three times a day (TID) | ORAL | 0 refills | Status: DC | PRN

## 2017-12-29 MED ORDER — AMOXICILLIN 500 MG PO CAPS: 1000.0000 mg | ORAL_CAPSULE | Freq: Two times a day (BID) | ORAL | 0 refills | Status: DC

## 2017-12-29 NOTE — Patient Instructions (Signed)
Please schedule a follow-up with your cardiologist- you were due there in May 2018;

## 2017-12-29 NOTE — Progress Notes (Signed)
Alexandra Beck is a 37 y.o. female with the following history as recorded in EpicCare:  Patient Active Problem List   Diagnosis Date Noted  . Cellulitis of left leg 04/17/2017  . Cellulitis of right leg 01/11/2017  . Abnormal EKG 12/16/2016  . PVC's (premature ventricular contractions) 12/02/2016  . Chronic diastolic CHF (congestive heart failure) ([place])   . Acute kidney injury ([place])   . Leg edema, left 06/10/2015  . Sepsis, unspecified organism ([place]) 07/23/2014  . Hyperlipidemia   . Diabetes mellitus type 2 in obese ([place])   . Hypothyroidism   . GERD (gastroesophageal reflux disease)   . Depression   . Venous insufficiency of leg   . Recurrent cellulitis of lower leg 12/25/2012  . OSA (obstructive sleep apnea) 12/25/2012  . Obesity hypoventilation syndrome ([place]) 12/25/2012  . Morbid obesity with BMI of 70 and over, adult ([place]) 12/24/2012  . Essential hypertension 06/26/2007    Current Outpatient Medications  Medication Sig Dispense Refill  . amoxicillin (AMOXIL) 500 MG capsule Take 1 capsule (500 mg total) 2 (two) times daily by mouth. 60 capsule 11  . empagliflozin (JARDIANCE) 10 MG TABS tablet TAKE 1 TABLET BY MOUTH DAILY. 90 tablet 3  . furosemide (LASIX) 80 MG tablet Take 1 tablet (80 mg total) by mouth 2 (two) times daily. 60 tablet 1  . levothyroxine (SYNTHROID, LEVOTHROID) 150 MCG tablet TAKE 1 TABLET(150 MCG) BY MOUTH DAILY BEFORE BREAKFAST. 90 tablet 3  . losartan (COZAAR) 25 MG tablet Take 1 tablet (25 mg total) by mouth daily. 90 tablet 3  . metoprolol succinate (TOPROL-XL) 50 MG 24 hr tablet TAKE 1 TABLET(50 MG) BY MOUTH DAILY. 90 tablet 3  . potassium chloride SA (K-DUR,KLOR-CON) 20 MEQ tablet Take 2 tablets (40 mEq total) by mouth daily. 60 tablet 11  . rosuvastatin (CRESTOR) 10 MG tablet Take 1 tablet (10 mg total) by mouth daily. 90 tablet 3  . traMADol (ULTRAM) 50 MG tablet Take 1-2 tablets (50-100 mg total) by mouth every 8 (eight) hours as needed. 60 tablet 0  .  albuterol (PROVENTIL HFA;VENTOLIN HFA) 108 (90 Base) MCG/ACT inhaler Inhale 2 puffs into the lungs every 6 (six) hours as needed for wheezing or shortness of breath. 1 Inhaler 0  . amoxicillin (AMOXIL) 500 MG capsule Take 2 capsules (1,000 mg total) by mouth 2 (two) times daily. 20 capsule 0  . benzonatate (TESSALON PERLES) 100 MG capsule Take 2 capsules (200 mg total) by mouth 3 (three) times daily as needed for cough. 20 capsule 0  . cephALEXin (KEFLEX) 500 MG capsule Take 500 mg by mouth 4 (four) times daily as needed (use per ID if cellulitis not responding to Amoxicillin).     No current facility-administered medications for this visit.     Allergies: Aspirin; Doxycycline; Lisinopril; and Niaspan [niacin er]  Past Medical History:  Diagnosis Date  . Acute renal failure (ARF) ([place]) 11/2012    multifactorial-likely secondary to ATN in the setting of sepsis and hypotension, and also from rhabdomyolysis  . Anemia   . Chronic diastolic CHF (congestive heart failure) ([place])   . Depression   . Diabetes mellitus ([place])   . GERD (gastroesophageal reflux disease)   . Hyperlipidemia   . Hypertension   . Hypothyroidism   . Morbid obesity with BMI of 70 and over, adult ([place])   . Obesity hypoventilation syndrome ([place])   . OSA (obstructive sleep apnea)   . PVC's (premature ventricular contractions)   . Recurrent cellulitis of  lower leg    LLE, venous insuff  . Sepsis ([place]) 11/2012   Secondary to cellulitis  . Venous insufficiency of leg     Past Surgical History:  Procedure Laterality Date  . IR FLUORO GUIDE CV MIDLINE PICC RIGHT  03/28/2017  . IR US GUIDE VASC ACCESS RIGHT  03/28/2017  . WISDOM TOOTH EXTRACTION      Family History  Problem Relation Age of Onset  . Hypertension Mother   . Hypertension Father   . Heart disease Father        before age 31    Social History   Tobacco Use  . Smoking status: Former Smoker    Years: 0.00  . Smokeless tobacco: Never Used  Substance Use  Topics  . Alcohol use: No    Alcohol/week: 0.0 oz    Subjective:  Patient presents with one week history of cough/ congestion; + wheezing; feels short of breath after coughing fits; using OTC Mucinex with limited benefit; no known fever; no chest pain; has taken albuterol in the past; on chronic Amoxicillin 500 mg bid for chronic suppression of cellulitis; prefers not to stop Amoxicillin unless necessary as she has responded very well to treatment;    Not surprised to see that blood pressure is elevated today- did not take her medication yesterday or today;   Overdue for follow-up for chronic care needs for Type 2 Diabetes, hypertension, hypothyroidism; plans to establish with new PCP here in April 2018;   Objective:  Vitals:   12/29/17 1010  BP: (!) 162/90  Pulse: 88  Temp: 98.4 F (36.9 C)  TempSrc: Oral  SpO2: 94%  Weight: (!) 468 lb 1.9 oz (212.3 kg)  Height: 5\' 2"  (1.575 m)    General: Well developed, well nourished, in no acute distress  Skin : Warm and dry.  Head: Normocephalic and atraumatic  Eyes: Sclera and conjunctiva clear; pupils round and reactive to light; extraocular movements intact  Ears: External normal; canals clear; tympanic membranes normal  Oropharynx: Pink, supple. No suspicious lesions  Neck: Supple without thyromegaly, adenopathy  Lungs: Respirations unlabored; wheezing noted in upper lobes CVS exam: normal rate and regular rhythm.   Neurologic: Alert and oriented; speech intact; face symmetrical; moves all extremities well; CNII-XII intact without focal deficit   Assessment:  1. Acute bronchitis, unspecified organism   2. Wheezing   3. Essential hypertension   4. Hypothyroidism, unspecified type   5. Diabetes mellitus type 2 in obese ([place])   6. Chronic diastolic CHF (congestive heart failure) ([place])     Plan:  1. & 2. Albuterol nebulizer treatment given in office; increase Amoxicillin to 1000 mg bid x 10 days; rx for albuterol inhaler and tessalon  perles; increase fluids, rest and follow-up worse, no better; if symptoms to persist, consider low dose prednisone; 3. Uncontrolled; not on medication x 2 days; re-start and take daily; keep planned follow-up; 4. Needs TSH updated at next OV; 5. Last Hgba1c in May 2018; reminded patient she needs to be seen for diabetes follow-up every 4-6 months; keep planned appt for April 2019; 6. She is encouraged to schedule follow-up with her cardiologist- was due in early 2018;   No Follow-up on file.  No orders of the defined types were placed in this encounter.   Requested Prescriptions   Signed Prescriptions Disp Refills  . amoxicillin (AMOXIL) 500 MG capsule 20 capsule 0    Sig: Take 2 capsules (1,000 mg total) by mouth 2 (two) times daily.  Marland Kitchen  albuterol (PROVENTIL HFA;VENTOLIN HFA) 108 (90 Base) MCG/ACT inhaler 1 Inhaler 0    Sig: Inhale 2 puffs into the lungs every 6 (six) hours as needed for wheezing or shortness of breath.  . benzonatate (TESSALON PERLES) 100 MG capsule 20 capsule 0    Sig: Take 2 capsules (200 mg total) by mouth 3 (three) times daily as needed for cough.

## 2018-01-01 ENCOUNTER — Telehealth: Payer: Self-pay

## 2018-01-01 ENCOUNTER — Other Ambulatory Visit: Payer: Self-pay | Admitting: Family

## 2018-01-01 ENCOUNTER — Encounter: Payer: Self-pay | Admitting: Family

## 2018-01-01 DIAGNOSIS — R059 Cough, unspecified: Secondary | ICD-10-CM

## 2018-01-01 DIAGNOSIS — R05 Cough: Secondary | ICD-10-CM

## 2018-01-01 MED ORDER — PREDNISONE 20 MG PO TABS: 40.0000 mg | ORAL_TABLET | Freq: Every day | ORAL | 0 refills | Status: DC

## 2018-01-01 NOTE — Telephone Encounter (Signed)
Please call her and let her know I would like to put her on prednisone and would like her to get a CXR; I will put the order in place for her to come get a CXR downstairs.  Thanks-

## 2018-01-01 NOTE — Telephone Encounter (Signed)
I called patient today and left message about predisone being faxed in to for her to come by office for a chest xray. I also emailed her back message via my-chart. CRM created as well incase she calls back.

## 2018-01-02 ENCOUNTER — Ambulatory Visit (INDEPENDENT_AMBULATORY_CARE_PROVIDER_SITE_OTHER)
Admission: RE | Admit: 2018-01-02 | Discharge: 2018-01-02 | Disposition: A | Discharge status: Home / Self Care | Payer: BC Managed Care – PPO | Source: Ambulatory Visit | Attending: Family | Admitting: Family

## 2018-01-02 DIAGNOSIS — R05 Cough: Secondary | ICD-10-CM

## 2018-01-02 DIAGNOSIS — R059 Cough, unspecified: Secondary | ICD-10-CM

## 2018-01-02 NOTE — Telephone Encounter (Signed)
Message was left for patient on yesterday.

## 2018-01-21 ENCOUNTER — Encounter: Payer: Self-pay | Admitting: Nurse Practitioner

## 2018-01-24 NOTE — Telephone Encounter (Signed)
Pt called checking on her med request  [facility]   cb is [phone]   Pt sent this in on 01/21/18

## 2018-01-24 NOTE — Telephone Encounter (Signed)
routing back to pcp office

## 2018-01-25 MED ORDER — FUROSEMIDE 80 MG PO TABS: 80.0000 mg | ORAL_TABLET | Freq: Two times a day (BID) | ORAL | 0 refills | Status: DC

## 2018-01-29 ENCOUNTER — Other Ambulatory Visit: Payer: Self-pay | Admitting: Nurse Practitioner

## 2018-01-29 ENCOUNTER — Other Ambulatory Visit: Payer: Self-pay | Admitting: Family

## 2018-01-29 DIAGNOSIS — I5032 Chronic diastolic (congestive) heart failure: Secondary | ICD-10-CM

## 2018-02-13 ENCOUNTER — Ambulatory Visit: Payer: BC Managed Care – PPO | Admitting: Internal Medicine

## 2018-02-27 ENCOUNTER — Ambulatory Visit: Payer: BC Managed Care – PPO | Admitting: Internal Medicine

## 2018-02-27 ENCOUNTER — Encounter: Payer: Self-pay | Admitting: Internal Medicine

## 2018-02-27 VITALS — BP 191/91 | HR 94 | Temp 98.2°F | Ht 62.0 in | Wt >= 6400 oz

## 2018-02-27 DIAGNOSIS — L03119 Cellulitis of unspecified part of limb: Secondary | ICD-10-CM | POA: Diagnosis not present

## 2018-02-27 DIAGNOSIS — I10 Essential (primary) hypertension: Secondary | ICD-10-CM

## 2018-02-27 NOTE — Progress Notes (Signed)
Patient ID: Alexandra Beck, female   DOB: [birth year], 37 y.o.   MRN: [id number]  HPI Eulene is a 37yo F with history of recurrent cellulitis to legs-requiring hospitalization, has been on chronic suppression with amoxicillin. Has not had recurrence since being on chronic suppression roughly 10 months. Since, we last saw her, she did have bout of bronchitis but now recovered.She continues to wear compression stockings. She would like to stay on her current dosage especially since she has upcoming events, including her wedding, that she does not want to jeapordize with hospitalization. She reports recent death in the family of her fiancee's.  Outpatient Encounter Medications as of 02/27/2018  Medication Sig  . amoxicillin (AMOXIL) 500 MG capsule Take 2 capsules (1,000 mg total) by mouth 2 (two) times daily.  . empagliflozin (JARDIANCE) 10 MG TABS tablet TAKE 1 TABLET BY MOUTH DAILY.  . furosemide (LASIX) 80 MG tablet Take 1 tablet (80 mg total) by mouth 2 (two) times daily. Must keep scheduled appt w/new provider for future refills  . levothyroxine (SYNTHROID, LEVOTHROID) 150 MCG tablet TAKE 1 TABLET(150 MCG) BY MOUTH DAILY BEFORE BREAKFAST.  Marland Kitchen losartan (COZAAR) 25 MG tablet Take 1 tablet (25 mg total) by mouth daily.  . metoprolol succinate (TOPROL-XL) 50 MG 24 hr tablet TAKE 1 TABLET(50 MG) BY MOUTH DAILY.  Marland Kitchen potassium chloride SA (K-DUR,KLOR-CON) 20 MEQ tablet Take 2 tablets (40 mEq total) by mouth daily.  . rosuvastatin (CRESTOR) 10 MG tablet Take 1 tablet (10 mg total) by mouth daily.  Marland Kitchen albuterol (PROVENTIL HFA;VENTOLIN HFA) 108 (90 Base) MCG/ACT inhaler Inhale 2 puffs into the lungs every 6 (six) hours as needed for wheezing or shortness of breath. (Patient not taking: Reported on 02/27/2018)  . amoxicillin (AMOXIL) 500 MG capsule Take 1 capsule (500 mg total) 2 (two) times daily by mouth.  . benzonatate (TESSALON PERLES) 100 MG capsule Take 2 capsules (200 mg total) by mouth 3 (three) times  daily as needed for cough. (Patient not taking: Reported on 02/27/2018)  . cephALEXin (KEFLEX) 500 MG capsule Take 500 mg by mouth 4 (four) times daily as needed (use per ID if cellulitis not responding to Amoxicillin).  . predniSONE (DELTASONE) 20 MG tablet Take 2 tablets (40 mg total) by mouth daily with breakfast. (Patient not taking: Reported on 02/27/2018)  . traMADol (ULTRAM) 50 MG tablet Take 1-2 tablets (50-100 mg total) by mouth every 8 (eight) hours as needed. (Patient not taking: Reported on 02/27/2018)   No facility-administered encounter medications on file as of 02/27/2018.      Patient Active Problem List   Diagnosis Date Noted  . Cellulitis of left leg 04/17/2017  . Cellulitis of right leg 01/11/2017  . Abnormal EKG 12/16/2016  . PVC's (premature ventricular contractions) 12/02/2016  . Chronic diastolic CHF (congestive heart failure) ([place])   . Acute kidney injury ([place])   . Leg edema, left 06/10/2015  . Sepsis, unspecified organism ([place]) 07/23/2014  . Hyperlipidemia   . Diabetes mellitus type 2 in obese ([place])   . Hypothyroidism   . GERD (gastroesophageal reflux disease)   . Depression   . Venous insufficiency of leg   . Recurrent cellulitis of lower leg 12/25/2012  . OSA (obstructive sleep apnea) 12/25/2012  . Obesity hypoventilation syndrome ([place]) 12/25/2012  . Morbid obesity with BMI of 70 and over, adult ([place]) 12/24/2012  . Essential hypertension 06/26/2007     Health Maintenance Due  Topic Date Due  . OPHTHALMOLOGY EXAM  06/01/1990  .  PAP SMEAR  06/01/2001  . FOOT EXAM  08/02/2014  . HEMOGLOBIN A1C  09/28/2017  . PNEUMOCOCCAL POLYSACCHARIDE VACCINE (2) 12/28/2017    Social History   Tobacco Use  . Smoking status: Former Smoker    Years: 0.00  . Smokeless tobacco: Never Used  Substance Use Topics  . Alcohol use: No    Alcohol/week: 0.0 oz  . Drug use: No    Review of Systems 12 point ros is otherwise negative. Resolved bronchitis Physical Exam   BP  (!) 191/91   Pulse 94   Temp 98.2 F (36.8 C) (Oral)   Ht 5\' 2"  (1.575 m)   Wt (!) 463 lb (210 kg)   LMP 02/21/2018 (Approximate)   BMI 84.68 kg/m   Physical Exam  Constitutional:  oriented to person, place, and time. appears well-developed and well-nourished. No distress.  HENT: [place]/AT, PERRLA, no scleral icterus Mouth/Throat: Oropharynx is clear and moist. No oropharyngeal exudate.  Skin: Skin is warm and dry. Mild hyperpigmentation on anterior tibia region. No rash noted. No erythema.  Psychiatric: a normal mood and affect.  behavior is normal.    CBC Lab Results  Component Value Date   WBC 4.6 04/19/2017   RBC 4.51 04/19/2017   HGB 12.1 04/19/2017   HCT 40.7 04/19/2017   PLT 151 04/19/2017   MCV 90.2 04/19/2017   MCH 26.8 04/19/2017   MCHC 29.7 (L) 04/19/2017   RDW 18.4 (H) 04/19/2017   LYMPHSABS 0.8 04/16/2017   MONOABS 0.7 04/16/2017   EOSABS 0.1 04/16/2017    BMET Lab Results  Component Value Date   NA 137 04/19/2017   K 3.6 04/19/2017   CL 102 04/19/2017   CO2 28 04/19/2017   GLUCOSE 128 (H) 04/19/2017   BUN 13 04/19/2017   CREATININE 0.98 04/19/2017   CALCIUM 8.6 (L) 04/19/2017   GFRNONAA >60 04/19/2017   GFRAA >60 04/19/2017      Assessment and Plan  Hx of cellulitis = Continue on amoxicillin 500mg  BID for chronic suppression. Can use keflex if needed for pre-emptive treatment  Dry skin = Continue to moisture legs with 50/50 eucerin plus vaseline  Will see back in October  HTN = suspect erroenous given asymptomatic, likely cuff error. She does have hx of htn on beta-blocker and ARB. Follow up with PCP

## 2018-03-09 ENCOUNTER — Ambulatory Visit: Payer: BC Managed Care – PPO | Admitting: Cardiology

## 2018-03-23 ENCOUNTER — Other Ambulatory Visit (INDEPENDENT_AMBULATORY_CARE_PROVIDER_SITE_OTHER): Payer: BC Managed Care – PPO

## 2018-03-23 ENCOUNTER — Encounter: Payer: Self-pay | Admitting: Nurse Practitioner

## 2018-03-23 ENCOUNTER — Ambulatory Visit: Payer: BC Managed Care – PPO | Admitting: Nurse Practitioner

## 2018-03-23 DIAGNOSIS — E1169 Type 2 diabetes mellitus with other specified complication: Secondary | ICD-10-CM

## 2018-03-23 DIAGNOSIS — G4733 Obstructive sleep apnea (adult) (pediatric): Secondary | ICD-10-CM | POA: Diagnosis not present

## 2018-03-23 DIAGNOSIS — E039 Hypothyroidism, unspecified: Secondary | ICD-10-CM

## 2018-03-23 DIAGNOSIS — E669 Obesity, unspecified: Secondary | ICD-10-CM | POA: Diagnosis not present

## 2018-03-23 DIAGNOSIS — F330 Major depressive disorder, recurrent, mild: Secondary | ICD-10-CM | POA: Diagnosis not present

## 2018-03-23 DIAGNOSIS — Z91199 Patient's noncompliance with other medical treatment and regimen due to unspecified reason: Secondary | ICD-10-CM

## 2018-03-23 DIAGNOSIS — Z6841 Body Mass Index (BMI) 40.0 and over, adult: Secondary | ICD-10-CM | POA: Diagnosis not present

## 2018-03-23 DIAGNOSIS — I10 Essential (primary) hypertension: Secondary | ICD-10-CM

## 2018-03-23 DIAGNOSIS — E7849 Other hyperlipidemia: Secondary | ICD-10-CM | POA: Diagnosis not present

## 2018-03-23 DIAGNOSIS — Z9119 Patient's noncompliance with other medical treatment and regimen: Secondary | ICD-10-CM | POA: Diagnosis not present

## 2018-03-23 DIAGNOSIS — L853 Xerosis cutis: Secondary | ICD-10-CM

## 2018-03-23 LAB — HEMOGLOBIN A1C: Hgb A1c MFr Bld: 6.5 % (ref 4.6–6.5)

## 2018-03-23 LAB — LIPID PANEL
Cholesterol: 186 mg/dL (ref 0–200)
HDL: 48.5 mg/dL (ref >39.00)
LDL CALC: 108 mg/dL — AB (ref 0–99)
NONHDL: 137.87
Total CHOL/HDL Ratio: 4
Triglycerides: 148 mg/dL (ref 0.0–149.0)
VLDL: 29.6 mg/dL (ref 0.0–40.0)

## 2018-03-23 LAB — COMPREHENSIVE METABOLIC PANEL
ALBUMIN: 3.9 g/dL (ref 3.5–5.2)
ALT: 10 U/L (ref 0–35)
AST: 12 U/L (ref 0–37)
Alkaline Phosphatase: 70 U/L (ref 39–117)
BUN: 14 mg/dL (ref 6–23)
CALCIUM: 9.5 mg/dL (ref 8.4–10.5)
CHLORIDE: 102 meq/L (ref 96–112)
CO2: 28 mEq/L (ref 19–32)
Creatinine, Ser: 0.82 mg/dL (ref 0.40–1.20)
GFR: 83.01 mL/min (ref >60.00)
Glucose, Bld: 117 mg/dL — ABNORMAL HIGH (ref 70–99)
POTASSIUM: 4.1 meq/L (ref 3.5–5.1)
SODIUM: 140 meq/L (ref 135–145)
Total Bilirubin: 0.5 mg/dL (ref 0.2–1.2)
Total Protein: 7.7 g/dL (ref 6.0–8.3)

## 2018-03-23 LAB — CBC
HCT: 46.7 % — ABNORMAL HIGH (ref 36.0–46.0)
HEMOGLOBIN: 14.8 g/dL (ref 12.0–15.0)
MCHC: 31.6 g/dL (ref 30.0–36.0)
MCV: 82.7 fl (ref 78.0–100.0)
PLATELETS: 230 10*3/uL (ref 150.0–400.0)
RBC: 5.65 Mil/uL — AB (ref 3.87–5.11)
RDW: 18.6 % — ABNORMAL HIGH (ref 11.5–15.5)
WBC: 7.9 10*3/uL (ref 4.0–10.5)

## 2018-03-23 LAB — TSH: TSH: 3.96 u[IU]/mL (ref 0.35–4.50)

## 2018-03-23 MED ORDER — FUROSEMIDE 80 MG PO TABS: 80.0000 mg | ORAL_TABLET | Freq: Every day | ORAL | 0 refills | Status: DC

## 2018-03-23 MED ORDER — FUROSEMIDE 80 MG PO TABS: 80.0000 mg | ORAL_TABLET | Freq: Two times a day (BID) | ORAL | 0 refills | Status: DC

## 2018-03-23 NOTE — Assessment & Plan Note (Signed)
Update TSH,  F/u with further recommendations pending lab results - TSH; Future

## 2018-03-23 NOTE — Assessment & Plan Note (Signed)
Update lipid panel, F/u with further recommendations pending lab results - Lipid panel; Future

## 2018-03-23 NOTE — Assessment & Plan Note (Signed)
Will refer to pulmonology to update sleep study - Ambulatory referral to Pulmonology

## 2018-03-23 NOTE — Progress Notes (Signed)
Name: Alexandra Beck   MRN: [id number]    DOB: July 03, 1981   Date:03/23/2018       Progress Note  Subjective  Chief Complaint  Chief Complaint  Patient presents with  . Establish Care    having issues with skin, states there are dry itchy patches, referral to for sleep issues, weight     HPI Aside from Alexandra Beck acute complaints, we will follow up on her chronic medical conditions. She also follows with infectious disease for recurrent cellulitis. She is scheduled to see cardiology for follow up of CHF on 06/01/18, she was following with cardiology routinely until last year when she stopped going for recommended follow up, but due to her chronic diastolic heart failure, morbid obesity and young age I recommend she return to cardiology for routine care and she was agreeable.  Skin problem- She has noticed patches of dry scaly skin to hands and elbows, does not routinely moisturize her skin  Sleepiness- This is not a new problem She feels excessively sleepy  She was started on CPAP at night about 2 years ago after a sleep study and noticed that her headaches improved but not her sleepiness She is so tired when her alarm clock goes off that she can't get out of the bed, even after sleeping for a full night 8 hours  Depression- This is a chronic problem She says she has felt some depression and mood issues since childhood She is not maintained on any medications for this problem She says her weight plays a huge role and she tries her best to eat right and not overeat, she and her partner have even made a conscious effort to work on improving their diet together this year, and she continues to gain weight She is tearful and upset when she got onto our scale today, says she weighs more than ever She is frustrated because she has been told multiple times she should have weight loss surgery but she is not interested in this option at this time- she can not afford to be out of work She denies  thoughts of hurting herself or anyone else  Hypertension -maintained on losartan 25, metoprolol succinate 50, lasix 80 once daily She is also maintained on potassium 57meq daily Reports she does not routinely check her blood pressure at home Denies headaches, vision changes, chest pain, shortness of breath.  BP Readings from Last 3 Encounters:  03/23/18 (!) 152/92  02/27/18 (!) 191/91  12/29/17 (!) 162/90   Diabetes- maintained on jardiance 10mg   Was on metformin in the past but stopped due to kidney function Reports she does not routinely check her blood sugars Denies tremor, diaphoresis, polyuria, polydipsia, polyphagia.  Lab Results  Component Value Date   HGBA1C 6.6 (H) 03/28/2017   Hypothryoidism-maintained on synthroid 150 once daily Denies decreased appetite, intolerance to hot or cold, palpitations, constipation.  Lab Results  Component Value Date   TSH 1.310 12/16/2016   Cholesterol- maintained on rosuvastatin 10 daaily Denies adverse medication effects including myalgias.  Lab Results  Component Value Date   CHOL 228 (H) 03/02/2016   HDL 41.10 03/02/2016   LDLCALC 92 07/15/2013   LDLDIRECT 141.0 03/02/2016   TRIG 295.0 (H) 03/02/2016   CHOLHDL 6 03/02/2016   Medication noncompliance- she admits that some days she only takes her lasix and antibiotics for cellulitis, does not always take all of her medications daily  Patient Active Problem List   Diagnosis Date Noted  . Cellulitis of left  leg 04/17/2017  . Cellulitis of right leg 01/11/2017  . Abnormal EKG 12/16/2016  . PVC's (premature ventricular contractions) 12/02/2016  . Chronic diastolic CHF (congestive heart failure) ([place])   . Acute kidney injury ([place])   . Leg edema, left 06/10/2015  . Sepsis, unspecified organism ([place]) 07/23/2014  . Hyperlipidemia   . Diabetes mellitus type 2 in obese ([place])   . Hypothyroidism   . GERD (gastroesophageal reflux disease)   . Depression   . Venous insufficiency of  leg   . Recurrent cellulitis of lower leg 12/25/2012  . OSA (obstructive sleep apnea) 12/25/2012  . Obesity hypoventilation syndrome ([place]) 12/25/2012  . Morbid obesity with BMI of 70 and over, adult ([place]) 12/24/2012  . Essential hypertension 06/26/2007    Past Surgical History:  Procedure Laterality Date  . IR FLUORO GUIDE CV MIDLINE PICC RIGHT  03/28/2017  . IR US GUIDE VASC ACCESS RIGHT  03/28/2017  . WISDOM TOOTH EXTRACTION      Family History  Problem Relation Age of Onset  . Hypertension Mother   . Hypertension Father   . Heart disease Father        before age 41    Social History   Socioeconomic History  . Marital status: Single    Spouse name: Not on file  . Number of children: Not on file  . Years of education: Not on file  . Highest education level: Not on file  Occupational History  . Occupation: Tree surgeon: [employer]  Social Needs  . Financial resource strain: Not on file  . Food insecurity:    Worry: Not on file    Inability: Not on file  . Transportation needs:    Medical: Not on file    Non-medical: Not on file  Tobacco Use  . Smoking status: Former Smoker    Years: 0.00  . Smokeless tobacco: Never Used  Substance and Sexual Activity  . Alcohol use: No    Alcohol/week: 0.0 oz  . Drug use: No  . Sexual activity: Not on file  Lifestyle  . Physical activity:    Days per week: Not on file    Minutes per session: Not on file  . Stress: Not on file  Relationships  . Social connections:    Talks on phone: Not on file    Gets together: Not on file    Attends religious service: Not on file    Active member of club or organization: Not on file    Attends meetings of clubs or organizations: Not on file    Relationship status: Not on file  . Intimate partner violence:    Fear of current or ex partner: Not on file    Emotionally abused: Not on file    Physically abused: Not on file    Forced sexual activity: Not on file  Other Topics Concern  .  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  albuterol (PROVENTIL HFA;VENTOLIN HFA) 108 (90 Base) MCG/ACT inhaler, Inhale 2 puffs into the lungs every 6 (six) hours as needed for wheezing or shortness of breath., Disp: 1 Inhaler, Rfl: 0 .  amoxicillin (AMOXIL) 500 MG capsule, Take 2 capsules (1,000 mg total) by mouth 2 (two) times daily., Disp: 20 capsule, Rfl: 0 .  benzonatate (TESSALON PERLES) 100 MG capsule, Take 2 capsules (200 mg total) by mouth 3 (three)  times daily as needed for cough., Disp: 20 capsule, Rfl: 0 .  cephALEXin (KEFLEX) 500 MG capsule, Take 500 mg by mouth 4 (four) times daily as needed (use per ID if cellulitis not responding to Amoxicillin)., Disp: , Rfl:  .  empagliflozin (JARDIANCE) 10 MG TABS tablet, TAKE 1 TABLET BY MOUTH DAILY., Disp: 90 tablet, Rfl: 3 .  furosemide (LASIX) 80 MG tablet, Take 1 tablet (80 mg total) by mouth 2 (two) times daily. Must keep scheduled appt w/new provider for future refills, Disp: 60 tablet, Rfl: 0 .  levothyroxine (SYNTHROID, LEVOTHROID) 150 MCG tablet, TAKE 1 TABLET(150 MCG) BY MOUTH DAILY BEFORE BREAKFAST., Disp: 90 tablet, Rfl: 3 .  losartan (COZAAR) 25 MG tablet, Take 1 tablet (25 mg total) by mouth daily., Disp: 90 tablet, Rfl: 3 .  metoprolol succinate (TOPROL-XL) 50 MG 24 hr tablet, TAKE 1 TABLET(50 MG) BY MOUTH DAILY., Disp: 90 tablet, Rfl: 3 .  potassium chloride SA (K-DUR,KLOR-CON) 20 MEQ tablet, Take 2 tablets (40 mEq total) by mouth daily., Disp: 60 tablet, Rfl: 11 .  predniSONE (DELTASONE) 20 MG tablet, Take 2 tablets (40 mg total) by mouth daily with breakfast., Disp: 10 tablet, Rfl: 0 .  rosuvastatin (CRESTOR) 10 MG tablet, Take 1 tablet (10 mg total) by mouth daily., Disp: 90 tablet, Rfl: 3 .  traMADol (ULTRAM) 50 MG tablet, Take 1-2 tablets (50-100 mg total) by mouth every 8 (eight) hours as needed., Disp: 60 tablet, Rfl:  0  Allergies  Allergen Reactions  . Aspirin Hives and Swelling    REACTION: throat swelling, hives  . Doxycycline Other (See Comments)    Abdominal pain  . Lisinopril Cough  . Niaspan [Niacin Er]     Caused flushing     ROS See HPI  Objective  Vitals:   03/23/18 1105  BP: (!) 152/92  Pulse: 77  Resp: 18  Temp: 99.1 F (37.3 C)  TempSrc: Oral  SpO2: 95%  Weight: (!) 470 lb 12.8 oz (213.6 kg)  Height: 5\' 2"  (1.575 m)    Body mass index is 86.11 kg/m.  Physical Exam Vital signs reviewed. Exam is limited due to body habitus Constitutional: Patient appears well-developed and well-nourished. Obese No distress.  HENT: Head: Normocephalic and atraumatic. Nose: Nose normal. Mouth/Throat: Oropharynx is clear and moist. .  Eyes: Conjunctivae and EOM are normal. Pupils are equal, round, and reactive to light. No scleral icterus.  Neck: Normal range of motion. Neck supple.  Cardiovascular: Normal rate, regular rhythm and Distal pulses intact. Pulmonary/Chest: Effort normal and breath sounds normal.  Neurological: She is alert and oriented to person, place, and time. Coordination, balance, strength, speech and gait are normal.  Skin: Skin is warm and dry. Dry skin to forearms. Psychiatric: Patient is anxious and tearful  Assessment & Plan F/U TBD pending labs  A total of 40  minutes were spent face-to-face with the patient during this encounter and over half of that time was spent on counseling and coordination of care. The patient was counseled on medication compliance, depression and chronic medical conditions     Dry skin Discussed use of thick daily moisturizing cream such as eucerin to dry skin. F/u for new or worsening symptoms or if no improvement with moisturizer.

## 2018-03-23 NOTE — Assessment & Plan Note (Signed)
We discussed referral to weight management to help with weight loss and she is agreeable. - TSH; Future - Lipid panel; Future - CBC; Future - Comprehensive metabolic panel; Future - Hemoglobin A1c; Future - Amb Ref to Medical Weight Management

## 2018-03-23 NOTE — Assessment & Plan Note (Signed)
We discussed F/U with counselor to help her with mood and coping with chronic conditions and she is agreeable Will also refer to medical weight management today for further evaluation of weight and weight loss options, as this is a large part of why she feels depressed - Ambulatory referral to Psychology

## 2018-03-23 NOTE — Assessment & Plan Note (Signed)
Blood pressure elevated today and on past readings She reports medication noncompliance and does not want to make medication adjustments at this time We discussed importance of daily medication compliance in the management of HTN - CBC; Future - Comprehensive metabolic panel; Future

## 2018-03-23 NOTE — Assessment & Plan Note (Signed)
We discussed the importance of daily medication compliance and routine follow up in the management of her chronic illnesses Labs will be updated today and F/U recommendations will be given pending lab results

## 2018-03-23 NOTE — Assessment & Plan Note (Signed)
Update A1c, F/u with further recommendations pending lab results - Hemoglobin A1c; Future

## 2018-03-23 NOTE — Patient Instructions (Signed)
Please head downstairs for lab work/x-rays. If any of your test results are critically abnormal, you will be contacted right away. Your results may be released to your MyChart for viewing before I am able to provide you with my response. I will contact you within a week about your test results and any recommendations for abnormalities.  I have placed a referral to psychology, weight management, pulmonology. Our office will call you to schedule this appointment. You should hear from our office in 7-10 days.  We will decide when you should follow up when your labs come back

## 2018-03-24 ENCOUNTER — Encounter: Payer: Self-pay | Admitting: Nurse Practitioner

## 2018-03-26 ENCOUNTER — Other Ambulatory Visit: Payer: Self-pay | Admitting: Nurse Practitioner

## 2018-03-26 MED ORDER — FUROSEMIDE 80 MG PO TABS: 80.0000 mg | ORAL_TABLET | Freq: Every day | ORAL | 2 refills | Status: DC

## 2018-04-05 ENCOUNTER — Other Ambulatory Visit: Payer: Self-pay | Admitting: Nurse Practitioner

## 2018-04-05 DIAGNOSIS — E669 Obesity, unspecified: Principal | ICD-10-CM

## 2018-04-05 DIAGNOSIS — E785 Hyperlipidemia, unspecified: Secondary | ICD-10-CM

## 2018-04-05 DIAGNOSIS — E039 Hypothyroidism, unspecified: Secondary | ICD-10-CM

## 2018-04-05 DIAGNOSIS — E1169 Type 2 diabetes mellitus with other specified complication: Secondary | ICD-10-CM

## 2018-04-05 MED ORDER — METFORMIN HCL ER 500 MG PO TB24: 500.0000 mg | ORAL_TABLET | Freq: Every day | ORAL | 1 refills | Status: DC

## 2018-04-11 ENCOUNTER — Encounter (INDEPENDENT_AMBULATORY_CARE_PROVIDER_SITE_OTHER): Payer: BC Managed Care – PPO

## 2018-04-17 ENCOUNTER — Encounter (INDEPENDENT_AMBULATORY_CARE_PROVIDER_SITE_OTHER): Payer: Self-pay | Admitting: Family Medicine

## 2018-04-17 ENCOUNTER — Ambulatory Visit (INDEPENDENT_AMBULATORY_CARE_PROVIDER_SITE_OTHER): Payer: BC Managed Care – PPO | Admitting: Family Medicine

## 2018-04-17 VITALS — BP 152/69 | HR 72 | Temp 98.0°F | Ht 62.0 in | Wt >= 6400 oz

## 2018-04-17 DIAGNOSIS — R0602 Shortness of breath: Secondary | ICD-10-CM | POA: Diagnosis not present

## 2018-04-17 DIAGNOSIS — Z0289 Encounter for other administrative examinations: Secondary | ICD-10-CM

## 2018-04-17 DIAGNOSIS — E119 Type 2 diabetes mellitus without complications: Secondary | ICD-10-CM | POA: Diagnosis not present

## 2018-04-17 DIAGNOSIS — Z6841 Body Mass Index (BMI) 40.0 and over, adult: Secondary | ICD-10-CM | POA: Diagnosis not present

## 2018-04-17 DIAGNOSIS — F3289 Other specified depressive episodes: Secondary | ICD-10-CM | POA: Diagnosis not present

## 2018-04-17 DIAGNOSIS — Z9189 Other specified personal risk factors, not elsewhere classified: Secondary | ICD-10-CM

## 2018-04-17 DIAGNOSIS — R5383 Other fatigue: Secondary | ICD-10-CM | POA: Diagnosis not present

## 2018-04-17 DIAGNOSIS — I509 Heart failure, unspecified: Secondary | ICD-10-CM | POA: Diagnosis not present

## 2018-04-17 DIAGNOSIS — Z1331 Encounter for screening for depression: Secondary | ICD-10-CM

## 2018-04-18 LAB — VITAMIN B12: Vitamin B-12: 251 pg/mL (ref 232–1245)

## 2018-04-18 LAB — MICROALBUMIN / CREATININE URINE RATIO
Creatinine, Urine: 156.2 mg/dL
MICROALB/CREAT RATIO: 1467.6 mg/g{creat} — AB (ref 0.0–30.0)
Microalbumin, Urine: 2292.4 ug/mL

## 2018-04-18 LAB — TSH: TSH: 1.6 u[IU]/mL (ref 0.450–4.500)

## 2018-04-18 LAB — FOLATE: Folate: 3 ng/mL — ABNORMAL LOW (ref >3.0)

## 2018-04-18 LAB — T4, FREE: Free T4: 1.39 ng/dL (ref 0.82–1.77)

## 2018-04-18 LAB — VITAMIN D 25 HYDROXY (VIT D DEFICIENCY, FRACTURES): VIT D 25 HYDROXY: 11.4 ng/mL — AB (ref 30.0–100.0)

## 2018-04-18 LAB — T3: T3, Total: 132 ng/dL (ref 71–180)

## 2018-04-18 LAB — INSULIN, RANDOM: INSULIN: 25 u[IU]/mL — AB (ref 2.6–24.9)

## 2018-04-18 NOTE — Progress Notes (Signed)
Office: [phone]  /  Fax: [phone]   Dear Lance Sell, NP,   Thank you for referring Alexandra Beck to our clinic. The following note includes my evaluation and treatment recommendations.  HPI:   Chief Complaint: OBESITY    [name] has been referred by Lance Sell, NP for consultation regarding her obesity and obesity related comorbidities.    Alexandra Beck (MR# [id number]) is a 37 y.o. female who presents on 04/18/2018 for obesity evaluation and treatment. Current BMI is Body mass index is 85.6 kg/m.Alexandra Beck has been struggling with her weight for many years and has been unsuccessful in either losing weight, maintaining weight loss, or reaching her healthy weight goal.     Alexandra Beck attended our information session and states she is currently in the action stage of change and ready to dedicate time achieving and maintaining a healthier weight. Alexandra Beck is interested in becoming our patient and working on intensive lifestyle modifications including (but not limited to) diet, exercise and weight loss.    Alexandra Beck states her family eats meals together she thinks her family will eat healthier with  her her desired weight loss is 165 lbs she has been heavy most of  her life she started gaining weight after congestive heart failure hospitalization her heaviest weight ever was 481 lbs. she has significant food cravings issues  she skips meals frequently she frequently makes poor food choices she frequently eats larger portions than normal  she has binge eating behaviors she struggles with emotional eating    Fatigue Alexandra Beck feels her energy is lower than it should be. This has worsened with weight gain and has not worsened recently. Alexandra Beck admits to daytime somnolence and  admits to waking up still tired. Patient has a diagnosis of obstructive sleep apnea, which may be contributing to her fatigue. Patent has a history of symptoms of daytime fatigue and morning fatigue.  Patient generally gets 4 to 10 hours of sleep per night, and states they generally have restless sleep. Snoring is present without CPAP. Apneic episodes are present without CPAP. Epworth Sleepiness Score is 11  EKG was ordered today and shows poor R wave progression.  Dyspnea on exertion [name] notes increasing shortness of breath with exercising and seems to be worsening over time with weight gain. She notes getting out of breath sooner with activity than she used to. This has not gotten worse recently. EKG was ordered today and shows poor R wave progression. Alexandra Beck denies orthopnea.  Diabetes II Alexandra Beck has a diagnosis of diabetes type II. Alexandra Beck is currently on metformin and she denies any hypoglycemic episodes. Last A1c was at 6.5 She is attempting to work on intensive lifestyle modifications including diet, exercise, and weight loss to help control her blood glucose levels  Depression with emotional eating behaviors Alexandra Beck has significant feelings of hopelessness regarding weight. Alexandra Beck struggles with emotional eating and using food for comfort to the extent that it is negatively impacting her health. She often snacks when she is not hungry. Alexandra Beck sometimes feels she is out of control and then feels guilty that she made poor food choices. She has been working on behavior modification techniques to help reduce her emotional eating and has been somewhat successful. She shows no sign of suicidal or homicidal ideations.  Congestive Heart Failure Alexandra Beck has a diagnosis of congestive heart failure and most recent ejection fraction was at 55%. Bina sees cardiology. Alexandra Beck is negative for lower extremity swelling.  At risk for  cardiovascular disease Alexandra Beck is at a higher than average risk for cardiovascular disease due to obesity. She currently denies any chest pain.   Depression screen [facility] 2/9 04/17/2018 02/27/2018 10/11/2017  Decreased Interest 3 0 0  Down, Depressed, Hopeless 3 0 0  PHQ - 2 Score 6 0 0    Altered sleeping 3 - -  Tired, decreased energy 3 - -  Change in appetite 2 - -  Feeling bad or failure about yourself  3 - -  Trouble concentrating 1 - -  Moving slowly or fidgety/restless 3 - -  Suicidal thoughts 2 - -  PHQ-9 Score 23 - -  Difficult doing work/chores Very difficult - -          Depression Screen Alexandra Beck's Food and Mood (modified PHQ-9) score was  Depression screen PHQ 2/9 04/17/2018  Decreased Interest 3  Down, Depressed, Hopeless 3  PHQ - 2 Score 6  Altered sleeping 3  Tired, decreased energy 3  Change in appetite 2  Feeling bad or failure about yourself  3  Trouble concentrating 1  Moving slowly or fidgety/restless 3  Suicidal thoughts 2  PHQ-9 Score 23  Difficult doing work/chores Very difficult    ALLERGIES: Allergies  Allergen Reactions  . Aspirin Hives and Swelling    REACTION: throat swelling, hives  . Doxycycline Other (See Comments)    Abdominal pain  . Lisinopril Cough  . Niaspan [Niacin Er]     Caused flushing    MEDICATIONS: Current Outpatient Medications on File Prior to Visit  Medication Sig Dispense Refill  . amoxicillin (AMOXIL) 500 MG capsule Take 2 capsules (1,000 mg total) by mouth 2 (two) times daily. 20 capsule 0  . furosemide (LASIX) 80 MG tablet Take 1 tablet (80 mg total) by mouth daily. Must keep scheduled appt w/new provider for future refills 60 tablet 2  . levothyroxine (SYNTHROID, LEVOTHROID) 150 MCG tablet TAKE 1 TABLET(150 MCG) BY MOUTH DAILY BEFORE BREAKFAST. 90 tablet 3  . losartan (COZAAR) 25 MG tablet Take 1 tablet (25 mg total) by mouth daily. 90 tablet 3  . metFORMIN (GLUCOPHAGE-XR) 500 MG 24 hr tablet Take 1 tablet (500 mg total) by mouth daily with breakfast. 30 tablet 1  . metoprolol succinate (TOPROL-XL) 50 MG 24 hr tablet TAKE 1 TABLET(50 MG) BY MOUTH DAILY. 90 tablet 3  . rosuvastatin (CRESTOR) 10 MG tablet Take 1 tablet (10 mg total) by mouth daily. 90 tablet 3  . benzonatate (TESSALON PERLES) 100 MG  capsule Take 2 capsules (200 mg total) by mouth 3 (three) times daily as needed for cough. 20 capsule 0   No current facility-administered medications on file prior to visit.     PAST MEDICAL HISTORY: Past Medical History:  Diagnosis Date  . Acute renal failure (ARF) ([place]) 11/2012    multifactorial-likely secondary to ATN in the setting of sepsis and hypotension, and also from rhabdomyolysis  . Anemia   . Cellulitis   . Chronic diastolic CHF (congestive heart failure) ([place])   . Depression   . Diabetes mellitus ([place])   . GERD (gastroesophageal reflux disease)   . Hyperlipidemia   . Hypertension   . Hypothyroidism   . Morbid obesity with BMI of 70 and over, adult ([place])   . Obesity hypoventilation syndrome ([place])   . OSA (obstructive sleep apnea)   . PVC's (premature ventricular contractions)   . Recurrent cellulitis of lower leg    LLE, venous insuff  . Sepsis ([place]) 11/2012  Secondary to cellulitis  . Sleep apnea   . Venous insufficiency of leg     PAST SURGICAL HISTORY: Past Surgical History:  Procedure Laterality Date  . IR FLUORO GUIDE CV MIDLINE PICC RIGHT  03/28/2017  . IR US GUIDE VASC ACCESS RIGHT  03/28/2017  . WISDOM TOOTH EXTRACTION      SOCIAL HISTORY: Social History   Tobacco Use  . Smoking status: Former Smoker    Years: 0.00  . Smokeless tobacco: Never Used  Substance Use Topics  . Alcohol use: No    Alcohol/week: 0.0 oz  . Drug use: No    FAMILY HISTORY: Family History  Problem Relation Age of Onset  . Hypertension Mother   . Diabetes Mother   . High Cholesterol Mother   . Thyroid disease Mother   . Sleep apnea Mother   . Obesity Mother   . Hypertension Father   . Heart disease Father        before age 73  . High Cholesterol Father   . Sleep apnea Father   . Obesity Father     ROS: Review of Systems  Constitutional: Positive for fever and malaise/fatigue.  Eyes:       Wear Glasses or Contacts  Respiratory: Positive for shortness of  breath (with activity).   Cardiovascular: Negative for chest pain and orthopnea.  Skin: Positive for itching and rash.       Dryness  Endo/Heme/Allergies:       Negative for hypoglycemia  Psychiatric/Behavioral: Positive for depression. Negative for suicidal ideas. The patient has insomnia.        Stress    PHYSICAL EXAM: Blood pressure (!) 152/69, pulse 72, temperature 98 F (36.7 C), temperature source Oral, height 5\' 2"  (1.575 m), weight (!) 468 lb (212.3 kg), last menstrual period 03/21/2018, SpO2 90 %. Body mass index is 85.6 kg/m. Physical Exam  Constitutional: She is oriented to person, place, and time. She appears well-developed and well-nourished.  HENT:  Head: Normocephalic and atraumatic.  Nose: Nose normal.  Eyes: EOM are normal. No scleral icterus.  Neck: Normal range of motion. Neck supple. No thyromegaly present.  Cardiovascular: Normal rate and regular rhythm.  Pulmonary/Chest: Effort normal. No respiratory distress.  Abdominal: Soft. There is no tenderness.  +obesity  Musculoskeletal: Normal range of motion.  Range of Motion normal in all 4 extremities  Neurological: She is alert and oriented to person, place, and time. Coordination normal.  Skin: Skin is warm and dry.  Psychiatric: Her behavior is normal.  Vitals reviewed.   RECENT LABS AND TESTS: BMET    Component Value Date/Time   NA 140 03/23/2018 1224   NA 140 12/16/2016 1438   K 4.1 03/23/2018 1224   CL 102 03/23/2018 1224   CO2 28 03/23/2018 1224   GLUCOSE 117 (H) 03/23/2018 1224   BUN 14 03/23/2018 1224   BUN 27 (H) 12/16/2016 1438   CREATININE 0.82 03/23/2018 1224   CREATININE 1.06 11/14/2016 1050   CALCIUM 9.5 03/23/2018 1224   CALCIUM 9.6 06/26/2007 0000   GFRNONAA >60 04/19/2017 0419   GFRAA >60 04/19/2017 0419   Lab Results  Component Value Date   HGBA1C 6.5 03/23/2018   Lab Results  Component Value Date   INSULIN 25.0 (H) 04/17/2018   CBC    Component Value Date/Time   WBC  7.9 03/23/2018 1224   RBC 5.65 (H) 03/23/2018 1224   HGB 14.8 03/23/2018 1224   HCT 46.7 (H) 03/23/2018 1224  PLT 230.0 03/23/2018 1224   MCV 82.7 03/23/2018 1224   MCV 91.7 12/23/2012 2054   MCH 26.8 04/19/2017 0419   MCHC 31.6 03/23/2018 1224   RDW 18.6 (H) 03/23/2018 1224   LYMPHSABS 0.8 04/16/2017 2351   MONOABS 0.7 04/16/2017 2351   EOSABS 0.1 04/16/2017 2351   BASOSABS 0.0 04/16/2017 2351   Iron/TIBC/Ferritin/ %Sat No results found for: IRON, TIBC, FERRITIN, IRONPCTSAT Lipid Panel     Component Value Date/Time   CHOL 186 03/23/2018 1224   TRIG 148.0 03/23/2018 1224   HDL 48.50 03/23/2018 1224   CHOLHDL 4 03/23/2018 1224   VLDL 29.6 03/23/2018 1224   LDLCALC 108 (H) 03/23/2018 1224   LDLDIRECT 141.0 03/02/2016 1101   Hepatic Function Panel     Component Value Date/Time   PROT 7.7 03/23/2018 1224   ALBUMIN 3.9 03/23/2018 1224   AST 12 03/23/2018 1224   ALT 10 03/23/2018 1224   ALKPHOS 70 03/23/2018 1224   BILITOT 0.5 03/23/2018 1224   BILIDIR 0.1 08/30/2016 1413   IBILI 0.5 12/25/2012 0223      Component Value Date/Time   TSH 1.600 04/17/2018 1239   TSH 3.96 03/23/2018 1224   TSH 1.310 12/16/2016 1438    ECG  shows NSR with a rate of 69 BPM INDIRECT CALORIMETER done today shows a VO2 of 389 and a REE of 2711.     ASSESSMENT AND PLAN: Other fatigue - Plan: EKG 12-Lead, VITAMIN D 25 Hydroxy (Vit-D Deficiency, Fractures), Vitamin B12, Folate, T3, T4, free, TSH, Microalbumin / creatinine urine ratio  Shortness of breath on exertion  Type 2 diabetes mellitus without complication, without long-term current use of insulin (HCC) - Plan: Insulin, random  Other depression - with emotional eating   Other congestive heart failure (HCC)  Depression screening  At risk for heart disease  Class 3 severe obesity with serious comorbidity and body mass index (BMI) greater than or equal to 70 in adult, unspecified obesity type ([place])  PLAN: Fatigue Remell was  informed that her fatigue may be related to obesity, depression or many other causes. Labs will be ordered, and in the meanwhile Chanae has agreed to work on diet, exercise and weight loss to help with fatigue. Proper sleep hygiene was discussed including the need for 7-8 hours of quality sleep each night. A sleep study was not ordered based on symptoms and Epworth score. We will order indirect calorimetry today.  Dyspnea on exertion Maxcine's shortness of breath appears to be obesity related and exercise induced. She has agreed to work on weight loss and gradually increase exercise to treat her exercise induced shortness of breath. If Shaquela follows our instructions and loses weight without improvement of her shortness of breath, we will plan to refer to pulmonology. We will order labs and indirect calorimetry today. We will monitor this condition regularly. Stellah agrees to this plan.  Diabetes II Anet has been given extensive diabetes education by myself today including ideal fasting and post-prandial blood glucose readings, individual ideal Hgb A1c goals and hypoglycemia prevention. We discussed the importance of good blood sugar control to decrease the likelihood of diabetic complications such as nephropathy, neuropathy, limb loss, blindness, coronary artery disease, and death. We discussed the importance of intensive lifestyle modification including diet, exercise and weight loss as the first line treatment for diabetes. We will check labs and Krishika agrees to continue her diabetes medications and will follow up at the agreed upon time.  Depression with Emotional Eating Behaviors We  discussed behavior modification techniques today to help Preslie deal with her emotional eating and depression. We will refer to Dr. Mallie Mussel and Graylon Gunning agreed to follow up as directed.  Congestive Heart Failure Sharlie will follow up with cardiology at previously scheduled appointment time. Renne agrees to follow up with our  clinic in 2 weeks.  Cardiovascular risk counseling Neta was given extended (15 minutes) coronary artery disease prevention counseling today. She is 36 y.o. female and has risk factors for heart disease including obesity, diabetes and congestive heart failure. We discussed intensive lifestyle modifications today with an emphasis on specific weight loss instructions and strategies. Pt was also informed of the importance of increasing exercise and decreasing saturated fats to help prevent heart disease.  Depression Screen [name] had a strongly positive depression screening. Depression is commonly associated with obesity and often results in emotional eating behaviors. We will monitor this closely and work on CBT to help improve the non-hunger eating patterns. We will refer to Dr. Mallie Mussel.  Obesity Jady is currently in the action stage of change and her goal is to continue with weight loss efforts. I recommend Sandrea begin the structured treatment plan as follows:  She has agreed to follow the Category 4 plan Angila has been instructed to eventually work up to a goal of 150 minutes of combined cardio and strengthening exercise per week for weight loss and overall health benefits. We discussed the following Behavioral Modification Strategies today: better snacking choices, planning for success, increasing lean protein intake, decreasing simple carbohydrates  and work on meal planning and easy cooking plans   She was informed of the importance of frequent follow up visits to maximize her success with intensive lifestyle modifications for her multiple health conditions. She was informed we would discuss her lab results at her next visit unless there is a critical issue that needs to be addressed sooner. Carlyne agreed to keep her next visit at the agreed upon time to discuss these results.    OBESITY BEHAVIORAL INTERVENTION VISIT  Today's visit was # 1 out of 22.  Starting weight: 468 lbs Starting  date: 04/17/18 Today's weight : 468 lbs Today's date: 04/17/2018 Total lbs lost to date: 0 (Patients must lose 7 lbs in the first 6 months to continue with counseling)   ASK: We discussed the diagnosis of obesity with Alexandra Beck today and Makayela agreed to give [place] permission to discuss obesity behavioral modification therapy today.  ASSESS: Gavin has the diagnosis of obesity and her BMI today is 85.58 Aniyla is in the action stage of change   ADVISE: Adja was educated on the multiple health risks of obesity as well as the benefit of weight loss to improve her health. She was advised of the need for long term treatment and the importance of lifestyle modifications.  AGREE: Multiple dietary modification options and treatment options were discussed and  Anvika agreed to the above obesity treatment plan.   I, Doreene Nest, am acting as transcriptionist for  Eber Jones, MD   I have reviewed the above documentation for accuracy and completeness, and I agree with the above. - [name] Qua, MD

## 2018-04-21 ENCOUNTER — Encounter (INDEPENDENT_AMBULATORY_CARE_PROVIDER_SITE_OTHER): Payer: Self-pay | Admitting: Family Medicine

## 2018-04-21 ENCOUNTER — Encounter: Payer: Self-pay | Admitting: Nurse Practitioner

## 2018-04-28 ENCOUNTER — Other Ambulatory Visit: Payer: Self-pay | Admitting: Physician Assistant

## 2018-04-28 DIAGNOSIS — I509 Heart failure, unspecified: Secondary | ICD-10-CM

## 2018-05-02 ENCOUNTER — Ambulatory Visit (INDEPENDENT_AMBULATORY_CARE_PROVIDER_SITE_OTHER): Payer: BC Managed Care – PPO | Admitting: Family Medicine

## 2018-05-02 ENCOUNTER — Encounter (INDEPENDENT_AMBULATORY_CARE_PROVIDER_SITE_OTHER): Payer: Self-pay | Admitting: Family Medicine

## 2018-05-02 ENCOUNTER — Encounter: Payer: Self-pay | Admitting: Nurse Practitioner

## 2018-05-02 VITALS — BP 128/69 | Ht 62.0 in | Wt >= 6400 oz

## 2018-05-02 DIAGNOSIS — Z6841 Body Mass Index (BMI) 40.0 and over, adult: Secondary | ICD-10-CM

## 2018-05-02 DIAGNOSIS — E66813 Obesity, class 3: Secondary | ICD-10-CM

## 2018-05-02 DIAGNOSIS — Z794 Long term (current) use of insulin: Secondary | ICD-10-CM | POA: Diagnosis not present

## 2018-05-02 DIAGNOSIS — E559 Vitamin D deficiency, unspecified: Secondary | ICD-10-CM

## 2018-05-02 DIAGNOSIS — E538 Deficiency of other specified B group vitamins: Secondary | ICD-10-CM

## 2018-05-02 DIAGNOSIS — E119 Type 2 diabetes mellitus without complications: Secondary | ICD-10-CM | POA: Diagnosis not present

## 2018-05-02 DIAGNOSIS — Z9189 Other specified personal risk factors, not elsewhere classified: Secondary | ICD-10-CM

## 2018-05-02 MED ORDER — VITAMIN D (ERGOCALCIFEROL) 1.25 MG (50000 UNIT) PO CAPS: 50000.0000 [IU] | ORAL_CAPSULE | ORAL | 0 refills | Status: DC

## 2018-05-02 NOTE — Progress Notes (Signed)
Office: [phone]  /  Fax: [phone]   HPI:   Chief Complaint: OBESITY Alexandra Beck is here to discuss her progress with her obesity treatment plan. She is on the Category 4 plan and is following her eating plan approximately 100 % of the time. She states she is exercising 0 minutes 0 times per week. Alexandra Beck enjoyed quantity of food, no hunger.  Her weight is (!) 455 lb (206.4 kg) today and has had a weight loss of 13 pounds over a period of 2 weeks since her last visit. She has lost 13 lbs since starting treatment with [place].  Vitamin D Deficiency Alexandra Beck has a diagnosis of vitamin D deficiency. She is not on OTC Vit D and denies nausea, vomiting or muscle weakness.  Folic Acid Deficiency Alexandra Beck has a diagnosis of folic acid deficiency. Folate level of 3.0.  Diabetes II Alexandra Beck has a diagnosis of diabetes type II. Alexandra Beck's Hgb A1c of 6.5 and insulin of 25.0. She denies any hypoglycemic episodes. She has been working on intensive lifestyle modifications including diet, exercise, and weight loss to help control her blood glucose levels.  At risk for cardiovascular disease Alexandra Beck is at a higher than average risk for cardiovascular disease due to obesity and diabetes II. She currently denies any chest pain.  ALLERGIES: Allergies  Allergen Reactions  . Aspirin Hives and Swelling    REACTION: throat swelling, hives  . Doxycycline Other (See Comments)    Abdominal pain  . Lisinopril Cough  . Niaspan [Niacin Er]     Caused flushing    MEDICATIONS: Current Outpatient Medications on File Prior to Visit  Medication Sig Dispense Refill  . amoxicillin (AMOXIL) 500 MG capsule Take 2 capsules (1,000 mg total) by mouth 2 (two) times daily. 20 capsule 0  . furosemide (LASIX) 80 MG tablet Take 1 tablet (80 mg total) by mouth daily. Must keep scheduled appt w/new provider for future refills 60 tablet 2  . levothyroxine (SYNTHROID, LEVOTHROID) 150 MCG tablet TAKE 1 TABLET(150 MCG) BY MOUTH DAILY BEFORE  BREAKFAST. 90 tablet 3  . losartan (COZAAR) 25 MG tablet Take 1 tablet (25 mg total) by mouth daily. 90 tablet 3  . metFORMIN (GLUCOPHAGE-XR) 500 MG 24 hr tablet Take 1 tablet (500 mg total) by mouth daily with breakfast. 30 tablet 1  . metoprolol succinate (TOPROL-XL) 50 MG 24 hr tablet TAKE 1 TABLET(50 MG) BY MOUTH DAILY. 90 tablet 3  . potassium chloride SA (K-DUR,KLOR-CON) 20 MEQ tablet TAKE 2 TABLETS(40 MEQ) BY MOUTH DAILY 60 tablet 0  . rosuvastatin (CRESTOR) 10 MG tablet Take 1 tablet (10 mg total) by mouth daily. 90 tablet 3   No current facility-administered medications on file prior to visit.     PAST MEDICAL HISTORY: Past Medical History:  Diagnosis Date  . Acute renal failure (ARF) ([place]) 11/2012    multifactorial-likely secondary to ATN in the setting of sepsis and hypotension, and also from rhabdomyolysis  . Anemia   . Cellulitis   . Chronic diastolic CHF (congestive heart failure) ([place])   . Depression   . Diabetes mellitus ([place])   . GERD (gastroesophageal reflux disease)   . Hyperlipidemia   . Hypertension   . Hypothyroidism   . Morbid obesity with BMI of 70 and over, adult ([place])   . Obesity hypoventilation syndrome ([place])   . OSA (obstructive sleep apnea)   . PVC's (premature ventricular contractions)   . Recurrent cellulitis of lower leg    LLE, venous insuff  .  Sepsis ([place]) 11/2012   Secondary to cellulitis  . Sleep apnea   . Venous insufficiency of leg     PAST SURGICAL HISTORY: Past Surgical History:  Procedure Laterality Date  . IR FLUORO GUIDE CV MIDLINE PICC RIGHT  03/28/2017  . IR US GUIDE VASC ACCESS RIGHT  03/28/2017  . WISDOM TOOTH EXTRACTION      SOCIAL HISTORY: Social History   Tobacco Use  . Smoking status: Former Smoker    Years: 0.00  . Smokeless tobacco: Never Used  Substance Use Topics  . Alcohol use: No    Alcohol/week: 0.0 oz  . Drug use: No    FAMILY HISTORY: Family History  Problem Relation Age of Onset  . Hypertension Mother    . Diabetes Mother   . High Cholesterol Mother   . Thyroid disease Mother   . Sleep apnea Mother   . Obesity Mother   . Hypertension Father   . Heart disease Father        before age 55  . High Cholesterol Father   . Sleep apnea Father   . Obesity Father     ROS: Review of Systems  Constitutional: Positive for weight loss.  Cardiovascular: Negative for chest pain.  Gastrointestinal: Negative for nausea and vomiting.  Musculoskeletal:       Negative muscle weakness  Endo/Heme/Allergies:       Negative hypoglycemia    PHYSICAL EXAM: Blood pressure 128/69, height 5\' 2"  (1.575 m), weight (!) 455 lb (206.4 kg). Body mass index is 83.22 kg/m. Physical Exam  Constitutional: She is oriented to person, place, and time. She appears well-developed and well-nourished.  Cardiovascular: Normal rate.  Pulmonary/Chest: Effort normal.  Musculoskeletal: Normal range of motion.  Neurological: She is oriented to person, place, and time.  Skin: Skin is warm and dry.  Psychiatric: She has a normal mood and affect. Her behavior is normal.  Vitals reviewed.   RECENT LABS AND TESTS: BMET    Component Value Date/Time   NA 140 03/23/2018 1224   NA 140 12/16/2016 1438   K 4.1 03/23/2018 1224   CL 102 03/23/2018 1224   CO2 28 03/23/2018 1224   GLUCOSE 117 (H) 03/23/2018 1224   BUN 14 03/23/2018 1224   BUN 27 (H) 12/16/2016 1438   CREATININE 0.82 03/23/2018 1224   CREATININE 1.06 11/14/2016 1050   CALCIUM 9.5 03/23/2018 1224   CALCIUM 9.6 06/26/2007 0000   GFRNONAA >60 04/19/2017 0419   GFRAA >60 04/19/2017 0419   Lab Results  Component Value Date   HGBA1C 6.5 03/23/2018   HGBA1C 6.6 (H) 03/28/2017   HGBA1C 6.9 (H) 03/02/2016   HGBA1C 6.4 07/15/2013   HGBA1C 6.2 (H) 12/25/2012   Lab Results  Component Value Date   INSULIN 25.0 (H) 04/17/2018   CBC    Component Value Date/Time   WBC 7.9 03/23/2018 1224   RBC 5.65 (H) 03/23/2018 1224   HGB 14.8 03/23/2018 1224   HCT 46.7  (H) 03/23/2018 1224   PLT 230.0 03/23/2018 1224   MCV 82.7 03/23/2018 1224   MCV 91.7 12/23/2012 2054   MCH 26.8 04/19/2017 0419   MCHC 31.6 03/23/2018 1224   RDW 18.6 (H) 03/23/2018 1224   LYMPHSABS 0.8 04/16/2017 2351   MONOABS 0.7 04/16/2017 2351   EOSABS 0.1 04/16/2017 2351   BASOSABS 0.0 04/16/2017 2351   Iron/TIBC/Ferritin/ %Sat No results found for: IRON, TIBC, FERRITIN, IRONPCTSAT Lipid Panel     Component Value Date/Time   CHOL 186  03/23/2018 1224   TRIG 148.0 03/23/2018 1224   HDL 48.50 03/23/2018 1224   CHOLHDL 4 03/23/2018 1224   VLDL 29.6 03/23/2018 1224   LDLCALC 108 (H) 03/23/2018 1224   LDLDIRECT 141.0 03/02/2016 1101   Hepatic Function Panel     Component Value Date/Time   PROT 7.7 03/23/2018 1224   ALBUMIN 3.9 03/23/2018 1224   AST 12 03/23/2018 1224   ALT 10 03/23/2018 1224   ALKPHOS 70 03/23/2018 1224   BILITOT 0.5 03/23/2018 1224   BILIDIR 0.1 08/30/2016 1413   IBILI 0.5 12/25/2012 0223      Component Value Date/Time   TSH 1.600 04/17/2018 1239   TSH 3.96 03/23/2018 1224   TSH 1.310 12/16/2016 1438  Results for MARIAVICTORIA, NOTTINGHAM (MRN [id number]) as of 05/02/2018 15:10  Ref. Range 04/17/2018 12:39  Vitamin D, 25-Hydroxy Latest Ref Range: 30.0 - 100.0 ng/mL 11.4 (L)    ASSESSMENT AND PLAN: Vitamin D deficiency - Plan: Vitamin D, Ergocalciferol, (DRISDOL) 50000 units CAPS capsule  Folic acid deficiency  Type 2 diabetes mellitus without complication, with long-term current use of insulin (HCC)  At risk for heart disease  Class 3 severe obesity with serious comorbidity and body mass index (BMI) greater than or equal to 70 in adult, unspecified obesity type ([place])  PLAN:  Vitamin D Deficiency Alexandra Beck was informed that low vitamin D levels contributes to fatigue and are associated with obesity, breast, and colon cancer. Deby agrees to start prescription Vit D @50 ,000 IU every week #4 with no refills. She will follow up for routine testing of vitamin  D, at least 2-3 times per year. She was informed of the risk of over-replacement of vitamin D and agrees to not increase her dose unless she discusses this with [place] first. Addilee agrees to follow up with our clinic in 2 weeks.  Folic Acid Deficiency Alexandra Beck agrees to start multivitamin and we will repeat labs in 3 months. Alexandra Beck agrees to follow up with our clinic in 2 weeks.  Diabetes II Alexandra Beck has been given extensive diabetes education by myself today including ideal fasting and post-prandial blood glucose readings, individual ideal Hgb A1c goals and hypoglycemia prevention. We discussed the importance of good blood sugar control to decrease the likelihood of diabetic complications such as nephropathy, neuropathy, limb loss, blindness, coronary artery disease, and death. We discussed the importance of intensive lifestyle modification including diet, exercise and weight loss as the first line treatment for diabetes. Alexandra Beck agrees to continue taking metformin 500 mg PO BID, get eye exam and foot exam. She was advised to consider Victoza, will discuss at next visit. Alexandra Beck agrees to follow up with our clinic in 2 weeks.  Cardiovascular risk counselling Alexandra Beck was given extended (30 minutes) coronary artery disease prevention counseling today. She is 37 y.o. female and has risk factors for heart disease including obesity and diabetes II. We discussed intensive lifestyle modifications today with an emphasis on specific weight loss instructions and strategies. Pt was also informed of the importance of increasing exercise and decreasing saturated fats to help prevent heart disease.  Obesity Alexandra Beck is currently in the action stage of change. As such, her goal is to continue with weight loss efforts She has agreed to follow the Category 4 plan Alexandra Beck has been instructed to work up to a goal of 150 minutes of combined cardio and strengthening exercise per week for weight loss and overall health benefits. We discussed  the following Behavioral Modification Strategies today: increasing lean protein  intake, increasing vegetables, work on meal planning and easy cooking plans, and planning for success   Alexandra Beck has agreed to follow up with our clinic in 2 weeks. She was informed of the importance of frequent follow up visits to maximize her success with intensive lifestyle modifications for her multiple health conditions.   OBESITY BEHAVIORAL INTERVENTION VISIT  Today's visit was # 2 out of 22.  Starting weight: 468 lbs Starting date: 04/17/18 Today's weight : 455 lbs  Today's date: 05/02/2018 Total lbs lost to date: 13 (Patients must lose 7 lbs in the first 6 months to continue with counseling)   ASK: We discussed the diagnosis of obesity with Alexandra Beck today and Alexandra Beck agreed to give [place] permission to discuss obesity behavioral modification therapy today.  ASSESS: Alexandra Beck has the diagnosis of obesity and her BMI today is 83.2 Alexandra Beck is in the action stage of change   ADVISE: Alexandra Beck was educated on the multiple health risks of obesity as well as the benefit of weight loss to improve her health. She was advised of the need for long term treatment and the importance of lifestyle modifications.  AGREE: Multiple dietary modification options and treatment options were discussed and  Alexandra Beck agreed to the above obesity treatment plan.  I, Trixie Dredge, am acting as transcriptionist for [name] Qua, MD  I have reviewed the above documentation for accuracy and completeness, and I agree with the above. - [name] Qua, MD

## 2018-05-07 ENCOUNTER — Other Ambulatory Visit: Payer: Self-pay | Admitting: Nurse Practitioner

## 2018-05-07 ENCOUNTER — Encounter (INDEPENDENT_AMBULATORY_CARE_PROVIDER_SITE_OTHER): Payer: Self-pay

## 2018-05-07 DIAGNOSIS — E7800 Pure hypercholesterolemia, unspecified: Secondary | ICD-10-CM

## 2018-05-07 NOTE — Progress Notes (Signed)
[place]

## 2018-05-11 ENCOUNTER — Encounter: Payer: Self-pay | Admitting: Pulmonary Disease

## 2018-05-11 ENCOUNTER — Ambulatory Visit: Payer: BC Managed Care – PPO | Admitting: Pulmonary Disease

## 2018-05-11 DIAGNOSIS — E662 Morbid (severe) obesity with alveolar hypoventilation: Secondary | ICD-10-CM

## 2018-05-11 DIAGNOSIS — G4733 Obstructive sleep apnea (adult) (pediatric): Secondary | ICD-10-CM

## 2018-05-11 DIAGNOSIS — G4710 Hypersomnia, unspecified: Secondary | ICD-10-CM | POA: Diagnosis not present

## 2018-05-11 NOTE — Assessment & Plan Note (Addendum)
Because of her hypersomnolence is not clear today.  Her OSA seems to be adequately treated.  Differential diagnosis includes hypersomnolence of OSA that is persisting in spite of adequate treatment, idiopathic hypersomnolence or narcolepsy.  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  Maintain sleep diary for 2 to 4 weeks. Schedule overnight CPAP study followed by M [facility]  Based on above, we will discuss treatment options including stimulant therapy

## 2018-05-11 NOTE — Assessment & Plan Note (Signed)
Will ensure that OSA is adequately treated. May also pursue a ABG on room air to see if there is significant hypercarbia causing early morning somnolence

## 2018-05-11 NOTE — Progress Notes (Signed)
Subjective:    Patient ID: Alexandra Beck, female    DOB: 01-Oct-1981, 37 y.o.   MRN: [id number]  HPI Chief Complaint  Patient presents with  . Sleep Consult    Former Dr. Gwenette Greet Pt for OSA. Referred by Caesar Chestnut for OSA. Tried a CPAP before, did not feel rested with machine.     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 OSA was diagnosed when she was in college and she has been maintained on CPAP since then.  Sleep study in 2009 confirmed severe OSA with AHI 93/hour and she has been maintained on auto settings since then.  Download from her CPAP is not available at the time of this dictation.  She and uses a full facemask and she obtained a new machine in 2015 from [place]. She reports that she has been getting :-) is on her CPAP and presumes that this is working well  Her main complaint is that she never feels rested in is extremely tired and sleepy during the day.  She has extreme difficulty waking up especially during the summer when she has to be at work by 10 AM. Epworth sleepiness score is 11 she reports sleepiness while sitting and reading, as a passenger in a car or lying down to rest in the afternoons or if she has to be at the desk at [employer] from 7 to 10 PM.  Bedtime is generally very late between 1 AM to 3 AM, she has always been a night owl ever since a teenager, sleep latency is less than 30 minutes, she sleeps on her side with 2 pillows, denies significant nocturia and is out of bed between 11 AM and noon unless she has to be at work earlier.  If not woken up she would sleep all day. Her weight is fluctuated within 30 pounds There is no history suggestive of cataplexy, sleep paralysis or parasomnias  She has a diagnosis of chronic diastolic heart failure, EF in 2001 was as low as 20% but last check 08/2016 was about 55%.  She takes 80 mg of Lasix daily She quit smoking 8 years  ago. There is no history of head injury  I note ABG from 06/2014 showing mild hypercarbia 7.35/50 1/90 5/97%  Significant tests/ events reviewed  NPSG 2009:  AHI 93/hr On auto 5-20cm   Past Medical History:  Diagnosis Date  . Acute renal failure (ARF) ([place]) 11/2012    multifactorial-likely secondary to ATN in the setting of sepsis and hypotension, and also from rhabdomyolysis  . Anemia   . Cellulitis   . Chronic diastolic CHF (congestive heart failure) ([place])   . Depression   . Diabetes mellitus ([place])   . GERD (gastroesophageal reflux disease)   . Hyperlipidemia   . Hypertension   . Hypothyroidism   . Morbid obesity with BMI of 70 and over, adult ([place])   . Obesity hypoventilation syndrome ([place])   . OSA (obstructive sleep apnea)   . PVC's (premature ventricular contractions)   . Recurrent cellulitis of lower leg    LLE, venous insuff  . Sepsis ([place]) 11/2012   Secondary to cellulitis  . Sleep apnea   . Venous insufficiency of leg       Past Surgical History:  Procedure Laterality Date  . IR FLUORO GUIDE CV MIDLINE PICC RIGHT  03/28/2017  . IR US GUIDE VASC ACCESS RIGHT  03/28/2017  . WISDOM TOOTH EXTRACTION  Allergies  Allergen Reactions  . Aspirin Hives and Swelling    REACTION: throat swelling, hives  . Doxycycline Other (See Comments)    Abdominal pain  . Lisinopril Cough  . Niaspan [Niacin Er]     Caused flushing    Social History   Socioeconomic History  . Marital status: Single    Spouse name: Not on file  . Number of children: 0  . Years of education: Not on file  . Highest education level: Not on file  Occupational History  . Occupation: Tree surgeon: [employer]  Social Needs  . Financial resource strain: Not on file  . Food insecurity:    Worry: Not on file    Inability: Not on file  . Transportation needs:    Medical: Not on file    Non-medical: Not on file  Tobacco Use  . Smoking status: Former Smoker    Years: 0.00  . Smokeless  tobacco: Never Used  Substance and Sexual Activity  . Alcohol use: No    Alcohol/week: 0.0 oz  . Drug use: No  . Sexual activity: Not on file  Lifestyle  . Physical activity:    Days per week: Not on file    Minutes per session: Not on file  . Stress: Not on file  Relationships  . Social connections:    Talks on phone: Not on file    Gets together: Not on file    Attends religious service: Not on file    Active member of club or organization: Not on file    Attends meetings of clubs or organizations: Not on file    Relationship status: Not on file  . Intimate partner violence:    Fear of current or ex partner: Not on file    Emotionally abused: Not on file    Physically abused: Not on file    Forced sexual activity: Not on file  Other Topics Concern  . 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 Hypertension Mother   . Diabetes Mother   . High Cholesterol Mother   . Thyroid disease Mother   . Sleep apnea Mother   . Obesity Mother   . Hypertension Father   . Heart disease Father        before age 36  . High Cholesterol Father   . Sleep apnea Father   . Obesity Father     Review of Systems Positive for excessive sleepiness as above, leg swelling, tearful during interview, anxiety and depression leading to her sleep symptoms  Constitutional: negative for anorexia, fevers and sweats  Eyes: negative for irritation, redness and visual disturbance  Ears, nose, mouth, throat, and face: negative for earaches, epistaxis, nasal congestion and sore throat  Respiratory: negative for cough,sputum and wheezing  Cardiovascular: negative for chest pain,  orthopnea, palpitations and syncope  Gastrointestinal: negative for abdominal pain, constipation, diarrhea, melena, nausea and vomiting  Genitourinary:negative for dysuria, frequency and hematuria  Hematologic/lymphatic:  negative for bleeding, easy bruising and lymphadenopathy  Musculoskeletal:negative for arthralgias, muscle weakness and stiff joints  Neurological: negative for coordination problems, gait problems Endocrine: negative for diabetic symptoms including polydipsia, polyuria and weight loss     Objective:   Physical Exam  Gen. Pleasant, obese, in no distress, normal affect ENT - no lesions, no post nasal drip, class 2-3 airway Neck:  No JVD, no thyromegaly, no carotid bruits Lungs: no use of accessory muscles, no dullness to percussion, decreased without rales or rhonchi  Cardiovascular: Rhythm regular, heart sounds  normal, no murmurs or gallops, no peripheral edema Abdomen: soft and non-tender, no hepatosplenomegaly, BS normal. Musculoskeletal: No deformities, no cyanosis or clubbing Neuro:  alert, non focal, no tremors       Assessment & Plan:

## 2018-05-11 NOTE — Patient Instructions (Signed)
Obtain CPAP download from [facility].  Maintain sleep diary for 2 to 4 weeks. Schedule overnight CPAP study followed by M [facility]  Based on above, we will discuss treatment options including stimulant therapy

## 2018-05-15 ENCOUNTER — Encounter: Payer: Self-pay | Admitting: Pulmonary Disease

## 2018-05-15 DIAGNOSIS — G4733 Obstructive sleep apnea (adult) (pediatric): Secondary | ICD-10-CM

## 2018-05-15 NOTE — Telephone Encounter (Signed)
I have looked further into the pt's chart and it looks like none of the orders that Dr. Elsworth Soho wanted from the pt's 05/11/18 OV were entered. This is why the pt has been contacted. All orders have been placed per Dr. Bari Mantis AVS instructions.

## 2018-05-17 ENCOUNTER — Ambulatory Visit (INDEPENDENT_AMBULATORY_CARE_PROVIDER_SITE_OTHER): Payer: BC Managed Care – PPO | Admitting: Family Medicine

## 2018-05-17 VITALS — BP 145/73 | HR 69 | Temp 98.2°F | Ht 62.0 in | Wt >= 6400 oz

## 2018-05-17 DIAGNOSIS — E559 Vitamin D deficiency, unspecified: Secondary | ICD-10-CM | POA: Diagnosis not present

## 2018-05-17 DIAGNOSIS — E119 Type 2 diabetes mellitus without complications: Secondary | ICD-10-CM

## 2018-05-17 DIAGNOSIS — Z6841 Body Mass Index (BMI) 40.0 and over, adult: Secondary | ICD-10-CM

## 2018-05-17 NOTE — Progress Notes (Signed)
Office: [phone]  /  Fax: [phone]   HPI:   Chief Complaint: OBESITY Alexandra Beck is here to discuss her progress with her obesity treatment plan. She is on the Category 4 plan and is following her eating plan approximately 93 % of the time. She states she is exercising 0 minutes 0 times per week. Alexandra Beck has a few indulgences in the past few weeks. Planning to indulge in a cupcake for her birthday.  Her weight is (!) 448 lb (203.2 kg) today and has had a weight loss of 7 pounds over a period of 2 weeks since her last visit. She has lost 20 lbs since starting treatment with [place].  Vitamin D Deficiency Alexandra Beck has a diagnosis of vitamin D deficiency. She is currently taking prescription Vit D. She notes fatigue and denies nausea, vomiting or muscle weakness.  Diabetes II Alexandra Beck has a diagnosis of diabetes type II. [name] denies carbohydrate cravings. She states she is not ready to add Victoza at this time given schedule uncertainty. She denies any hypoglycemic episodes. Last A1c was 6.5 on 03/23/18. She has been working on intensive lifestyle modifications including diet, exercise, and weight loss to help control her blood glucose levels.  ALLERGIES: Allergies  Allergen Reactions  . Aspirin Hives and Swelling    REACTION: throat swelling, hives  . Doxycycline Other (See Comments)    Abdominal pain  . Lisinopril Cough  . Niaspan [Niacin Er]     Caused flushing    MEDICATIONS: Current Outpatient Medications on File Prior to Visit  Medication Sig Dispense Refill  . furosemide (LASIX) 80 MG tablet Take 1 tablet (80 mg total) by mouth daily. Must keep scheduled appt w/new provider for future refills 60 tablet 2  . levothyroxine (SYNTHROID, LEVOTHROID) 150 MCG tablet TAKE 1 TABLET(150 MCG) BY MOUTH DAILY BEFORE BREAKFAST. 90 tablet 3  . losartan (COZAAR) 25 MG tablet Take 1 tablet (25 mg total) by mouth daily. 90 tablet 3  . metFORMIN (GLUCOPHAGE-XR) 500 MG 24 hr tablet Take 1 tablet (500 mg  total) by mouth daily with breakfast. 30 tablet 1  . metoprolol succinate (TOPROL-XL) 50 MG 24 hr tablet TAKE 1 TABLET(50 MG) BY MOUTH DAILY. 90 tablet 3  . potassium chloride SA (K-DUR,KLOR-CON) 20 MEQ tablet TAKE 2 TABLETS(40 MEQ) BY MOUTH DAILY 60 tablet 0  . rosuvastatin (CRESTOR) 10 MG tablet Take 1 tablet (10 mg total) by mouth daily. 90 tablet 3  . Vitamin D, Ergocalciferol, (DRISDOL) 50000 units CAPS capsule Take 1 capsule (50,000 Units total) by mouth every 7 (seven) days. 4 capsule 0   No current facility-administered medications on file prior to visit.     PAST MEDICAL HISTORY: Past Medical History:  Diagnosis Date  . Acute renal failure (ARF) ([place]) 11/2012    multifactorial-likely secondary to ATN in the setting of sepsis and hypotension, and also from rhabdomyolysis  . Anemia   . Cellulitis   . Chronic diastolic CHF (congestive heart failure) ([place])   . Depression   . Diabetes mellitus ([place])   . GERD (gastroesophageal reflux disease)   . Hyperlipidemia   . Hypertension   . Hypothyroidism   . Morbid obesity with BMI of 70 and over, adult ([place])   . Obesity hypoventilation syndrome ([place])   . OSA (obstructive sleep apnea)   . PVC's (premature ventricular contractions)   . Recurrent cellulitis of lower leg    LLE, venous insuff  . Sepsis ([place]) 11/2012   Secondary to cellulitis  . Sleep  apnea   . Venous insufficiency of leg     PAST SURGICAL HISTORY: Past Surgical History:  Procedure Laterality Date  . IR FLUORO GUIDE CV MIDLINE PICC RIGHT  03/28/2017  . IR US GUIDE VASC ACCESS RIGHT  03/28/2017  . WISDOM TOOTH EXTRACTION      SOCIAL HISTORY: Social History   Tobacco Use  . Smoking status: Former Smoker    Years: 0.00  . Smokeless tobacco: Never Used  Substance Use Topics  . Alcohol use: No    Alcohol/week: 0.0 oz  . Drug use: No    FAMILY HISTORY: Family History  Problem Relation Age of Onset  . Hypertension Mother   . Diabetes Mother   . High  Cholesterol Mother   . Thyroid disease Mother   . Sleep apnea Mother   . Obesity Mother   . Hypertension Father   . Heart disease Father        before age 16  . High Cholesterol Father   . Sleep apnea Father   . Obesity Father     ROS: Review of Systems  Constitutional: Positive for malaise/fatigue and weight loss.  Gastrointestinal: Negative for nausea and vomiting.  Musculoskeletal:       Negative muscle weakness  Endo/Heme/Allergies:       Negative hypoglycemia    PHYSICAL EXAM: Blood pressure (!) 145/73, pulse 69, temperature 98.2 F (36.8 C), temperature source Oral, height 5\' 2"  (1.575 m), weight (!) 448 lb (203.2 kg), SpO2 90 %. Body mass index is 81.94 kg/m. Physical Exam  Constitutional: She is oriented to person, place, and time. She appears well-developed and well-nourished.  Cardiovascular: Normal rate.  Pulmonary/Chest: Effort normal.  Musculoskeletal: Normal range of motion.  Neurological: She is oriented to person, place, and time.  Skin: Skin is warm and dry.  Psychiatric: She has a normal mood and affect. Her behavior is normal.  Vitals reviewed.   RECENT LABS AND TESTS: BMET    Component Value Date/Time   NA 140 03/23/2018 1224   NA 140 12/16/2016 1438   K 4.1 03/23/2018 1224   CL 102 03/23/2018 1224   CO2 28 03/23/2018 1224   GLUCOSE 117 (H) 03/23/2018 1224   BUN 14 03/23/2018 1224   BUN 27 (H) 12/16/2016 1438   CREATININE 0.82 03/23/2018 1224   CREATININE 1.06 11/14/2016 1050   CALCIUM 9.5 03/23/2018 1224   CALCIUM 9.6 06/26/2007 0000   GFRNONAA >60 04/19/2017 0419   GFRAA >60 04/19/2017 0419   Lab Results  Component Value Date   HGBA1C 6.5 03/23/2018   HGBA1C 6.6 (H) 03/28/2017   HGBA1C 6.9 (H) 03/02/2016   HGBA1C 6.4 07/15/2013   HGBA1C 6.2 (H) 12/25/2012   Lab Results  Component Value Date   INSULIN 25.0 (H) 04/17/2018   CBC    Component Value Date/Time   WBC 7.9 03/23/2018 1224   RBC 5.65 (H) 03/23/2018 1224   HGB 14.8  03/23/2018 1224   HCT 46.7 (H) 03/23/2018 1224   PLT 230.0 03/23/2018 1224   MCV 82.7 03/23/2018 1224   MCV 91.7 12/23/2012 2054   MCH 26.8 04/19/2017 0419   MCHC 31.6 03/23/2018 1224   RDW 18.6 (H) 03/23/2018 1224   LYMPHSABS 0.8 04/16/2017 2351   MONOABS 0.7 04/16/2017 2351   EOSABS 0.1 04/16/2017 2351   BASOSABS 0.0 04/16/2017 2351   Iron/TIBC/Ferritin/ %Sat No results found for: IRON, TIBC, FERRITIN, IRONPCTSAT Lipid Panel     Component Value Date/Time   CHOL 186 03/23/2018  1224   TRIG 148.0 03/23/2018 1224   HDL 48.50 03/23/2018 1224   CHOLHDL 4 03/23/2018 1224   VLDL 29.6 03/23/2018 1224   LDLCALC 108 (H) 03/23/2018 1224   LDLDIRECT 141.0 03/02/2016 1101   Hepatic Function Panel     Component Value Date/Time   PROT 7.7 03/23/2018 1224   ALBUMIN 3.9 03/23/2018 1224   AST 12 03/23/2018 1224   ALT 10 03/23/2018 1224   ALKPHOS 70 03/23/2018 1224   BILITOT 0.5 03/23/2018 1224   BILIDIR 0.1 08/30/2016 1413   IBILI 0.5 12/25/2012 0223      Component Value Date/Time   TSH 1.600 04/17/2018 1239   TSH 3.96 03/23/2018 1224   TSH 1.310 12/16/2016 1438   Results for NEYSHA, CRIADO (MRN [id number]) as of 05/17/2018 16:49  Ref. Range 04/17/2018 12:39  Vitamin D, 25-Hydroxy Latest Ref Range: 30.0 - 100.0 ng/mL 11.4 (L)   ASSESSMENT AND PLAN: Vitamin D deficiency  Type 2 diabetes mellitus without complication, without long-term current use of insulin (HCC)  Class 3 severe obesity with serious comorbidity and body mass index (BMI) greater than or equal to 70 in adult, unspecified obesity type (HCC)  PLAN:  Vitamin D Deficiency Alexandra Beck was informed that low vitamin D levels contributes to fatigue and are associated with obesity, breast, and colon cancer. Alexandra Beck agrees to continue taking prescription Vit D @50 ,000 IU every week, no refill needed and will follow up for routine testing of vitamin D, at least 2-3 times per year. She was informed of the risk of over-replacement of  vitamin D and agrees to not increase her dose unless she discusses this with [place] first. Alexandra Beck agrees to follow up with our clinic in 2 weeks.  Diabetes II Alexandra Beck has been given extensive diabetes education by myself today including ideal fasting and post-prandial blood glucose readings, individual ideal Hgb A1c goals and hypoglycemia prevention. We discussed the importance of good blood sugar control to decrease the likelihood of diabetic complications such as nephropathy, neuropathy, limb loss, blindness, coronary artery disease, and death. We discussed the importance of intensive lifestyle modification including diet, exercise and weight loss as the first line treatment for diabetes. Alexandra Beck agrees to continue metformin and we will revisit Victoza conversation in the Fall. Alexandra Beck agrees to follow up with our clinic in 2 weeks.  We spent > than 50% of the 15 minute visit on the counseling as documented in the note.  Obesity Alexandra Beck is currently in the action stage of change. As such, her goal is to continue with weight loss efforts She has agreed to keep a food journal with 500-700 calories and 45+ grams of protein at supper daily and follow the Category 4 plan Alexandra Beck has been instructed to work up to a goal of 150 minutes of combined cardio and strengthening exercise per week for weight loss and overall health benefits. We discussed the following Behavioral Modification Strategies today: increasing lean protein intake, planning for success, and keep a strict food journal   Alexandra Beck has agreed to follow up with our clinic in 2 weeks. She was informed of the importance of frequent follow up visits to maximize her success with intensive lifestyle modifications for her multiple health conditions.   OBESITY BEHAVIORAL INTERVENTION VISIT  Today's visit was # 3 out of 22.  Starting weight: 468 lbs Starting date: 04/17/18 Today's weight : 448 lbs Today's date: 05/17/2018 Total lbs lost to date: 20 (Patients  must lose 7 lbs in the first 6 months  to continue with counseling)   ASK: We discussed the diagnosis of obesity with Alexandra Beck today and Alexandra Beck agreed to give [place] permission to discuss obesity behavioral modification therapy today.  ASSESS: Alexandra Beck has the diagnosis of obesity and her BMI today is 81.92 Alexandra Beck is in the action stage of change   ADVISE: Alexandra Beck was educated on the multiple health risks of obesity as well as the benefit of weight loss to improve her health. She was advised of the need for long term treatment and the importance of lifestyle modifications.  AGREE: Multiple dietary modification options and treatment options were discussed and  Alexandra Beck agreed to the above obesity treatment plan.  I, Trixie Dredge, am acting as transcriptionist for [name] Qua, MD  I have reviewed the above documentation for accuracy and completeness, and I agree with the above. - [name] Qua, MD

## 2018-05-18 ENCOUNTER — Encounter: Payer: Self-pay | Admitting: Pulmonary Disease

## 2018-05-20 ENCOUNTER — Encounter: Payer: Self-pay | Admitting: Pulmonary Disease

## 2018-05-21 ENCOUNTER — Telehealth: Payer: Self-pay | Admitting: Pulmonary Disease

## 2018-05-21 NOTE — Telephone Encounter (Signed)
Have not received download yet Please obtain

## 2018-05-21 NOTE — Telephone Encounter (Signed)
Download has been printed and placed in RA's look-at-folder for review.

## 2018-05-22 NOTE — Telephone Encounter (Signed)
CPAP download was reviewed for the past month. Good compliance X 0.5 hours average every night. Average pressure of 14 cm. Few residual AHI 7/hour, but given that her baseline was 93/hour that seems okay. Minimal leak.  Another with CPAP is working well. I note that her M SLT is still about a month away. Proceed with checking ONO on CPAP meantime

## 2018-05-22 NOTE — Telephone Encounter (Signed)
Attempted to call patient today regarding CPAP DL results. I did not receive an answer at time of call. I have left a voicemail message for pt to return call. X1

## 2018-05-23 ENCOUNTER — Telehealth: Payer: Self-pay | Admitting: Pulmonary Disease

## 2018-05-23 DIAGNOSIS — G4733 Obstructive sleep apnea (adult) (pediatric): Secondary | ICD-10-CM

## 2018-05-23 NOTE — Telephone Encounter (Signed)
Advised pt of results. Pt understood and nothing further is needed. She states she is going to have those tests done.     Rigoberto Noel, MD  to [facility] Triage Pool     1:33 PM  Note    CPAP download was reviewed for the past month. Good compliance X 0.5 hours average every night. Average pressure of 14 cm. Few residual AHI 7/hour, but given that her baseline was 93/hour that seems okay. Minimal leak.  Another with CPAP is working well. I note that her M SLT is still about a month away. Proceed with checking ONO on CPAP meantime

## 2018-05-24 ENCOUNTER — Encounter: Payer: Self-pay | Admitting: Pulmonary Disease

## 2018-05-25 ENCOUNTER — Other Ambulatory Visit: Payer: Self-pay | Admitting: Physician Assistant

## 2018-05-25 DIAGNOSIS — I509 Heart failure, unspecified: Secondary | ICD-10-CM

## 2018-05-30 ENCOUNTER — Ambulatory Visit (INDEPENDENT_AMBULATORY_CARE_PROVIDER_SITE_OTHER): Payer: BC Managed Care – PPO | Admitting: Family Medicine

## 2018-05-30 VITALS — BP 145/89 | HR 64 | Temp 97.9°F | Ht 62.0 in | Wt >= 6400 oz

## 2018-05-30 DIAGNOSIS — Z9189 Other specified personal risk factors, not elsewhere classified: Secondary | ICD-10-CM

## 2018-05-30 DIAGNOSIS — E119 Type 2 diabetes mellitus without complications: Secondary | ICD-10-CM

## 2018-05-30 DIAGNOSIS — I509 Heart failure, unspecified: Secondary | ICD-10-CM

## 2018-05-30 DIAGNOSIS — Z6841 Body Mass Index (BMI) 40.0 and over, adult: Secondary | ICD-10-CM

## 2018-05-30 DIAGNOSIS — E559 Vitamin D deficiency, unspecified: Secondary | ICD-10-CM

## 2018-05-30 MED ORDER — VITAMIN D (ERGOCALCIFEROL) 1.25 MG (50000 UNIT) PO CAPS: 50000.0000 [IU] | ORAL_CAPSULE | ORAL | 0 refills | Status: DC

## 2018-05-31 ENCOUNTER — Encounter (INDEPENDENT_AMBULATORY_CARE_PROVIDER_SITE_OTHER): Payer: Self-pay | Admitting: Family Medicine

## 2018-06-01 ENCOUNTER — Ambulatory Visit: Payer: BC Managed Care – PPO | Admitting: Cardiology

## 2018-06-01 ENCOUNTER — Encounter: Payer: Self-pay | Admitting: Cardiology

## 2018-06-01 VITALS — BP 142/80 | HR 65 | Ht 62.0 in | Wt >= 6400 oz

## 2018-06-01 DIAGNOSIS — I493 Ventricular premature depolarization: Secondary | ICD-10-CM

## 2018-06-01 DIAGNOSIS — I5042 Chronic combined systolic (congestive) and diastolic (congestive) heart failure: Secondary | ICD-10-CM

## 2018-06-01 DIAGNOSIS — Z6841 Body Mass Index (BMI) 40.0 and over, adult: Secondary | ICD-10-CM

## 2018-06-01 DIAGNOSIS — I10 Essential (primary) hypertension: Secondary | ICD-10-CM | POA: Diagnosis not present

## 2018-06-01 MED ORDER — FUROSEMIDE 80 MG PO TABS: 80.0000 mg | ORAL_TABLET | Freq: Every day | ORAL | 3 refills | Status: DC

## 2018-06-01 MED ORDER — LOSARTAN POTASSIUM 50 MG PO TABS: 50.0000 mg | ORAL_TABLET | Freq: Every day | ORAL | 3 refills | Status: DC

## 2018-06-01 NOTE — Progress Notes (Signed)
Cardiology Office Note    Date:  06/01/2018  ID:  Alexandra Beck, DOB June 02, 1981, MRN [id number] PCP:  Lance Sell, NP  Cardiologist:  Dr. Meda Coffee   Chief Complaint: f/u CHF, abnormal EKG  History of Present Illness:  Alexandra Beck is a 37 y.o. female with history of extreme morbid obesity, OHS/OSA, physical inactivity, hypertension, hyperlipidemia, hypothyroidism, anemia, DM, hypothyroidism, GERD, recurrent cellulitis, venous insufficiency, chronic diastolic CHF, PVCs on EKG, acute renal failure in 2014 who presents for follow-up of CHF and abnormal EKG. 2D echo 09/15/16: mild LVH, EF 55%, no RWMA, trivial AI, poor image quality. She has been followed over the fall for volume overload, ultimately achieving improvement in symptoms with combination of Lasix and metolazone. She was seen in clinic in followup 2 weeks ago for reassessment at which time labs showed BUN 34, Cr 1.13, potassium 3.2, magnesium 1.8. She had been taking metolazone every other day - based on labs, she was instructed to decrease metolazone to twice weekly and increase her potassium on metolazone days. Labs otherwise notable in 08/2016 for albumin 3.4, BNP 641, WBC 12.2, Hgb 12.4. UA 07/2016 with 100 protein. She was feeling well but EKG demonstrated frequent PVCs and more notable TW changes compared to prior (diffuse TWI I, II, possibly avF, V2-V6, TW flattening avL, III). These changes were seen when she was admitted in 2014, but new compared to 2015 & 2017. Per review with Dr. Meda Coffee, it was unclear if these were related to electrolyte deficiency, LVH, or ischemia, thus she was brought back for recheck today.  Alexandra Beck presents back to clinic today feeling well. She states she misinterpreted the results call and has not decreased her metolazone to twice a week yet. She feels her edema is stable. She did increase her potassium though. She remains inactive but plans on joining [employer] next week and walking regularly.  She denies any recent chest pain or shortness of breath. No palpitations.   06/01/2018 -patient is coming after 6 months, she has joint a weight loss group and she has been able to lose 26 pounds so far with some improvement in symptoms.  She still gets short of breath when walking longer distances.  She is motivated to lose more weight, she has also changed her diet with counting calories.  She is right now not interested in bariatric surgery, she has noticed her blood pressure has been elevated, she has stable lower extremity edema controlled by Lasix 80 mg daily, twice a day Lasix was very inconvenient because of frequent urination during the night.  She otherwise has occasional paroxysmal nocturnal dyspnea.  She denies any chest pain palpitations or syncope.  Past Medical History:  Diagnosis Date  . Acute renal failure (ARF) ([place]) 11/2012    multifactorial-likely secondary to ATN in the setting of sepsis and hypotension, and also from rhabdomyolysis  . Anemia   . Cellulitis   . Chronic diastolic CHF (congestive heart failure) ([place])   . Depression   . Diabetes mellitus ([place])   . GERD (gastroesophageal reflux disease)   . Hyperlipidemia   . Hypertension   . Hypothyroidism   . Morbid obesity with BMI of 70 and over, adult ([place])   . Obesity hypoventilation syndrome ([place])   . OSA (obstructive sleep apnea)   . PVC's (premature ventricular contractions)   . Recurrent cellulitis of lower leg    LLE, venous insuff  . Sepsis ([place]) 11/2012   Secondary to cellulitis  . Sleep  apnea   . Venous insufficiency of leg     Past Surgical History:  Procedure Laterality Date  . IR FLUORO GUIDE CV MIDLINE PICC RIGHT  03/28/2017  . IR US GUIDE VASC ACCESS RIGHT  03/28/2017  . WISDOM TOOTH EXTRACTION      Current Medications: Current Outpatient Medications  Medication Sig Dispense Refill  . amoxicillin (AMOXIL) 500 MG capsule Take 500 mg by mouth 2 (two) times daily.    . furosemide (LASIX) 80 MG tablet  Take 1 tablet (80 mg total) by mouth daily. Must keep scheduled appt w/new provider for future refills 60 tablet 2  . levothyroxine (SYNTHROID, LEVOTHROID) 150 MCG tablet TAKE 1 TABLET(150 MCG) BY MOUTH DAILY BEFORE BREAKFAST. 90 tablet 3  . losartan (COZAAR) 25 MG tablet Take 1 tablet (25 mg total) by mouth daily. 90 tablet 3  . metFORMIN (GLUCOPHAGE-XR) 500 MG 24 hr tablet Take 1 tablet (500 mg total) by mouth daily with breakfast. 30 tablet 1  . metoprolol succinate (TOPROL-XL) 50 MG 24 hr tablet TAKE 1 TABLET(50 MG) BY MOUTH DAILY. 90 tablet 3  . potassium chloride SA (K-DUR,KLOR-CON) 20 MEQ tablet TAKE 2 TABLETS(40 MEQ) BY MOUTH DAILY 60 tablet 1  . rosuvastatin (CRESTOR) 10 MG tablet Take 1 tablet (10 mg total) by mouth daily. 90 tablet 3  . Vitamin D, Ergocalciferol, (DRISDOL) 50000 units CAPS capsule Take 1 capsule (50,000 Units total) by mouth every 7 (seven) days. 4 capsule 0   No current facility-administered medications for this visit.      Allergies:   Aspirin; Doxycycline; Lisinopril; and Niaspan [niacin er]   Social History   Socioeconomic History  . Marital status: Single    Spouse name: Not on file  . Number of children: 0  . Years of education: Not on file  . Highest education level: Not on file  Occupational History  . Occupation: Tree surgeon: [employer]  Social Needs  . Financial resource strain: Not on file  . Food insecurity:    Worry: Not on file    Inability: Not on file  . Transportation needs:    Medical: Not on file    Non-medical: Not on file  Tobacco Use  . Smoking status: Former Smoker    Years: 0.00  . Smokeless tobacco: Never Used  Substance and Sexual Activity  . Alcohol use: No    Alcohol/week: 0.0 oz  . Drug use: No  . Sexual activity: Not on file  Lifestyle  . Physical activity:    Days per week: Not on file    Minutes per session: Not on file  . Stress: Not on file  Relationships  . Social connections:    Talks on phone: Not on  file    Gets together: Not on file    Attends religious service: Not on file    Active member of club or organization: Not on file    Attends meetings of clubs or organizations: Not on file    Relationship status: Not on file  Other Topics Concern  . 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   ROS:  Please see the history of present illness. All other systems are reviewed and otherwise negative.    PHYSICAL EXAM:   VS:  BP (!) 142/80   Pulse 65   Ht 5\' 2"  (1.575 m)   Wt (!) 445 lb 6.4 oz (202 kg)   SpO2 93%   BMI 81.46 kg/m   BMI: Body mass index is 81.46 kg/m. GEN: Well nourished, well developed obese WF, in no acute distress  HEENT: normocephalic, atraumatic Neck: no JVD or masses. Carotid bruits assessed last visit, no interim change. Cardiac: RRR; no murmurs, rubs, or gallops, chronic lower extremity edema superimposed on large baseline leg habitus Respiratory:  clear to auscultation bilaterally, normal work of breathing GI: soft, nontender, nondistended, + BS MS: no deformity or atrophy  Skin: warm and dry, compression hose in place, no diffuse rashes Neuro:  Alert and Oriented x 3, Strength and sensation are intact, follows commands Psych: euthymic mood, full affect  Wt Readings from Last 3 Encounters:  06/01/18 (!) 445 lb 6.4 oz (202 kg)  05/30/18 (!) 442 lb (200.5 kg)  05/17/18 (!) 448 lb (203.2 kg)      Studies/Labs Reviewed:   EKG:  EKG was ordered today and personally reviewed by me and demonstrates NSR TWI I, II, III, avF, V4-V6 simlar to previous tracing earlier this month  Recent Labs: 03/23/2018: ALT 10; BUN  14; Creatinine, Ser 0.82; Hemoglobin 14.8; Platelets 230.0; Potassium 4.1; Sodium 140 04/17/2018: TSH 1.600   Lipid Panel    Component Value Date/Time   CHOL 186 03/23/2018 1224   TRIG 148.0 03/23/2018 1224   HDL 48.50 03/23/2018 1224   CHOLHDL 4 03/23/2018 1224   VLDL 29.6 03/23/2018 1224   LDLCALC 108 (H) 03/23/2018 1224   LDLDIRECT 141.0 03/02/2016 1101    Additional studies/ records that were reviewed today include: Summarized above.  EKG performed today June 01, 2018 showed normal sinus rhythm normal EKG, this was personally reviewed.  ASSESSMENT & PLAN:   1. Chronic combined systolic and diastolic CHF, we will recheck echocardiogram as she has not had any in 2 years, I will increase losartan to 50 mg daily, her creatinine is normal, I will continue Lasix 80 mg daily.  Her edema is partially due to lymphedema and chronic venostasis as well as physical inactivity. 2. Hypertension -increase losartan to 50 mg daily 3. Frequent PVCs -she is asymptomatic.  No PVCs on today's EKG. 4. Morbid obesity -she is encouraged by her weight loss group, not interested in bariatric surgery right now, explained that that option she stagnates with weight loss.  Disposition: F/u with Dr. Meda Coffee in 4 months  Medication Adjustments/Labs and Tests Ordered: Current medicines are reviewed at length with the patient tod iay.  Concerns regarding medicines are outlined above. Medication changes, Labs and Tests ordered today are summarized above and listed in the Patient Instructions accessible in Encounters.   Raechel Ache PA-C  06/01/2018 3:26 PM    [facility] [place], [place], [place]  [address] Phone: [phone]; Fax: [phone]

## 2018-06-01 NOTE — Patient Instructions (Addendum)
Medication Instructions:  Your physician has recommended you make the following change in your medication:   1. INCREASE: losartan to 50 mg once daily  2. A refill for your furosemide (lasix) 80 mg daily was sent to your pharmacy  Labwork: None ordered  Testing/Procedures: Your physician has requested that you have an echocardiogram. Echocardiography is a painless test that uses sound waves to create images of your heart. It provides your doctor with information about the size and shape of your heart and how well your heart's chambers and valves are working. This procedure takes approximately one hour. There are no restrictions for this procedure.  Follow-Up: Your physician recommends that you schedule a follow-up appointment in: 4 months with Dr. Meda Coffee    Any Other Special Instructions Will Be Listed Below (If Applicable).  Echocardiogram An echocardiogram, or echocardiography, uses sound waves (ultrasound) to produce an image of your heart. The echocardiogram is simple, painless, obtained within a short period of time, and offers valuable information to your health care provider. The images from an echocardiogram can provide information such as:  Evidence of coronary artery disease (CAD).  Heart size.  Heart muscle function.  Heart valve function.  Aneurysm detection.  Evidence of a past heart attack.  Fluid buildup around the heart.  Heart muscle thickening.  Assess heart valve function.  Tell a health care provider about:  Any allergies you have.  All medicines you are taking, including vitamins, herbs, eye drops, creams, and over-the-counter medicines.  Any problems you or family members have had with anesthetic medicines.  Any blood disorders you have.  Any surgeries you have had.  Any medical conditions you have.  Whether you are pregnant or may be pregnant. What happens before the procedure? No special preparation is needed. Eat and drink  normally. What happens during the procedure?  In order to produce an image of your heart, gel will be applied to your chest and a wand-like tool (transducer) will be moved over your chest. The gel will help transmit the sound waves from the transducer. The sound waves will harmlessly bounce off your heart to allow the heart images to be captured in real-time motion. These images will then be recorded.  You may need an IV to receive a medicine that improves the quality of the pictures. What happens after the procedure? You may return to your normal schedule including diet, activities, and medicines, unless your health care provider tells you otherwise. This information is not intended to replace advice given to you by your health care provider. Make sure you discuss any questions you have with your health care provider. Document Released: 11/11/2000 Document Revised: 07/02/2016 Document Reviewed: 07/22/2013 Elsevier Interactive Patient Education  2017 [employer].    If you need a refill on your cardiac medications before your next appointment, please call your pharmacy.

## 2018-06-01 NOTE — Progress Notes (Signed)
Office: [phone]  /  Fax: [phone]   HPI:   Chief Complaint: OBESITY Alexandra Beck is here to discuss her progress with her obesity treatment plan. She is on the keep a food journal with 500-700 calories and 45+ grams of protein at supper daily and follow the Category 4 plan and is following her eating plan approximately 95 % of the time. She states she is exercising 0 minutes 0 times per week. Alexandra Beck has enjoyed doing a free Friday with her fiancee, where she goes out to eat and still follows calories and protein goals.  Her weight is (!) 442 lb (200.5 kg) today and has had a weight loss of 6 pounds over a period of 2 weeks since her last visit. She has lost 26 lbs since starting treatment with [place].  Vitamin D Deficiency Alexandra Beck has a diagnosis of vitamin D deficiency. She is currently taking prescription Vit D She notes fatigue and denies nausea, vomiting or muscle weakness.  At risk for osteopenia and osteoporosis Alexandra Beck is at higher risk of osteopenia and osteoporosis due to vitamin D deficiency.   Diabetes II Alexandra Beck has a diagnosis of diabetes type II. Alexandra Beck denies GI side effects of metformin or carbohydrate cravings and she denies any hypoglycemic episodes. Last A1c was 6.5. She has been working on intensive lifestyle modifications including diet, exercise, and weight loss to help control her blood glucose levels.  Congestive Heart Failure Alexandra Beck has congestive heart failure. She denies orthopnea, edema, or increased shortness of breath.  ALLERGIES: Allergies  Allergen Reactions  . Aspirin Hives and Swelling    REACTION: throat swelling, hives  . Doxycycline Other (See Comments)    Abdominal pain  . Lisinopril Cough  . Niaspan [Niacin Er]     Caused flushing    MEDICATIONS: Current Outpatient Medications on File Prior to Visit  Medication Sig Dispense Refill  . furosemide (LASIX) 80 MG tablet Take 1 tablet (80 mg total) by mouth daily. Must keep scheduled appt w/new provider  for future refills 60 tablet 2  . levothyroxine (SYNTHROID, LEVOTHROID) 150 MCG tablet TAKE 1 TABLET(150 MCG) BY MOUTH DAILY BEFORE BREAKFAST. 90 tablet 3  . losartan (COZAAR) 25 MG tablet Take 1 tablet (25 mg total) by mouth daily. 90 tablet 3  . metFORMIN (GLUCOPHAGE-XR) 500 MG 24 hr tablet Take 1 tablet (500 mg total) by mouth daily with breakfast. 30 tablet 1  . metoprolol succinate (TOPROL-XL) 50 MG 24 hr tablet TAKE 1 TABLET(50 MG) BY MOUTH DAILY. 90 tablet 3  . potassium chloride SA (K-DUR,KLOR-CON) 20 MEQ tablet TAKE 2 TABLETS(40 MEQ) BY MOUTH DAILY 60 tablet 1  . rosuvastatin (CRESTOR) 10 MG tablet Take 1 tablet (10 mg total) by mouth daily. 90 tablet 3   No current facility-administered medications on file prior to visit.     PAST MEDICAL HISTORY: Past Medical History:  Diagnosis Date  . Acute renal failure (ARF) ([place]) 11/2012    multifactorial-likely secondary to ATN in the setting of sepsis and hypotension, and also from rhabdomyolysis  . Anemia   . Cellulitis   . Chronic diastolic CHF (congestive heart failure) ([place])   . Depression   . Diabetes mellitus ([place])   . GERD (gastroesophageal reflux disease)   . Hyperlipidemia   . Hypertension   . Hypothyroidism   . Morbid obesity with BMI of 70 and over, adult ([place])   . Obesity hypoventilation syndrome ([place])   . OSA (obstructive sleep apnea)   . PVC's (premature ventricular contractions)   .  Recurrent cellulitis of lower leg    LLE, venous insuff  . Sepsis ([place]) 11/2012   Secondary to cellulitis  . Sleep apnea   . Venous insufficiency of leg     PAST SURGICAL HISTORY: Past Surgical History:  Procedure Laterality Date  . IR FLUORO GUIDE CV MIDLINE PICC RIGHT  03/28/2017  . IR US GUIDE VASC ACCESS RIGHT  03/28/2017  . WISDOM TOOTH EXTRACTION      SOCIAL HISTORY: Social History   Tobacco Use  . Smoking status: Former Smoker    Years: 0.00  . Smokeless tobacco: Never Used  Substance Use Topics  . Alcohol use: No     Alcohol/week: 0.0 oz  . Drug use: No    FAMILY HISTORY: Family History  Problem Relation Age of Onset  . Hypertension Mother   . Diabetes Mother   . High Cholesterol Mother   . Thyroid disease Mother   . Sleep apnea Mother   . Obesity Mother   . Hypertension Father   . Heart disease Father        before age 59  . High Cholesterol Father   . Sleep apnea Father   . Obesity Father     ROS: Review of Systems  Constitutional: Positive for malaise/fatigue and weight loss.  Respiratory: Negative for shortness of breath.   Cardiovascular: Negative for orthopnea.  Gastrointestinal: Negative for nausea and vomiting.  Musculoskeletal:       Negative muscle weakness  Endo/Heme/Allergies:       Negative hypoglycemia    PHYSICAL EXAM: Blood pressure (!) 145/89, pulse 64, temperature 97.9 F (36.6 C), temperature source Oral, height 5\' 2"  (1.575 m), weight (!) 442 lb (200.5 kg), SpO2 91 %. Body mass index is 80.84 kg/m. Physical Exam  Constitutional: She is oriented to person, place, and time. She appears well-developed and well-nourished.  Cardiovascular: Normal rate.  Pulmonary/Chest: Effort normal.  Musculoskeletal: Normal range of motion.  Neurological: She is oriented to person, place, and time.  Skin: Skin is warm and dry.  Psychiatric: She has a normal mood and affect. Her behavior is normal.  Vitals reviewed.   RECENT LABS AND TESTS: BMET    Component Value Date/Time   NA 140 03/23/2018 1224   NA 140 12/16/2016 1438   K 4.1 03/23/2018 1224   CL 102 03/23/2018 1224   CO2 28 03/23/2018 1224   GLUCOSE 117 (H) 03/23/2018 1224   BUN 14 03/23/2018 1224   BUN 27 (H) 12/16/2016 1438   CREATININE 0.82 03/23/2018 1224   CREATININE 1.06 11/14/2016 1050   CALCIUM 9.5 03/23/2018 1224   CALCIUM 9.6 06/26/2007 0000   GFRNONAA >60 04/19/2017 0419   GFRAA >60 04/19/2017 0419   Lab Results  Component Value Date   HGBA1C 6.5 03/23/2018   HGBA1C 6.6 (H) 03/28/2017    HGBA1C 6.9 (H) 03/02/2016   HGBA1C 6.4 07/15/2013   HGBA1C 6.2 (H) 12/25/2012   Lab Results  Component Value Date   INSULIN 25.0 (H) 04/17/2018   CBC    Component Value Date/Time   WBC 7.9 03/23/2018 1224   RBC 5.65 (H) 03/23/2018 1224   HGB 14.8 03/23/2018 1224   HCT 46.7 (H) 03/23/2018 1224   PLT 230.0 03/23/2018 1224   MCV 82.7 03/23/2018 1224   MCV 91.7 12/23/2012 2054   MCH 26.8 04/19/2017 0419   MCHC 31.6 03/23/2018 1224   RDW 18.6 (H) 03/23/2018 1224   LYMPHSABS 0.8 04/16/2017 2351   MONOABS 0.7 04/16/2017 2351  EOSABS 0.1 04/16/2017 2351   BASOSABS 0.0 04/16/2017 2351   Iron/TIBC/Ferritin/ %Sat No results found for: IRON, TIBC, FERRITIN, IRONPCTSAT Lipid Panel     Component Value Date/Time   CHOL 186 03/23/2018 1224   TRIG 148.0 03/23/2018 1224   HDL 48.50 03/23/2018 1224   CHOLHDL 4 03/23/2018 1224   VLDL 29.6 03/23/2018 1224   LDLCALC 108 (H) 03/23/2018 1224   LDLDIRECT 141.0 03/02/2016 1101   Hepatic Function Panel     Component Value Date/Time   PROT 7.7 03/23/2018 1224   ALBUMIN 3.9 03/23/2018 1224   AST 12 03/23/2018 1224   ALT 10 03/23/2018 1224   ALKPHOS 70 03/23/2018 1224   BILITOT 0.5 03/23/2018 1224   BILIDIR 0.1 08/30/2016 1413   IBILI 0.5 12/25/2012 0223      Component Value Date/Time   TSH 1.600 04/17/2018 1239   TSH 3.96 03/23/2018 1224   TSH 1.310 12/16/2016 1438  Results for HAYDIN, DUNN (MRN [id number]) as of 06/01/2018 14:02  Ref. Range 04/17/2018 12:39  Vitamin D, 25-Hydroxy Latest Ref Range: 30.0 - 100.0 ng/mL 11.4 (L)    ASSESSMENT AND PLAN: Vitamin D deficiency - Plan: Vitamin D, Ergocalciferol, (DRISDOL) 50000 units CAPS capsule  Type 2 diabetes mellitus without complication, without long-term current use of insulin (HCC)  Other congestive heart failure (HCC)  At risk for osteoporosis  Class 3 severe obesity with serious comorbidity and body mass index (BMI) greater than or equal to 70 in adult, unspecified obesity  type ([place])  PLAN:  Vitamin D Deficiency Alayja was informed that low vitamin D levels contributes to fatigue and are associated with obesity, breast, and colon cancer. Preslyn agrees to continue taking prescription Vit D @50 ,000 IU every week #4 and we will refill for 1 month. She will follow up for routine testing of vitamin D, at least 2-3 times per year. She was informed of the risk of over-replacement of vitamin D and agrees to not increase her dose unless she discusses this with [place] first. Kelseigh agrees to follow up with our clinic in 2 weeks.  At risk for osteopenia and osteoporosis Avion is at risk for osteopenia and osteoporsis due to her vitamin D deficiency. She was encouraged to take her vitamin D and follow her higher calcium diet and increase strengthening exercise to help strengthen her bones and decrease her risk of osteopenia and osteoporosis.  Diabetes II Aastha has been given extensive diabetes education by myself today including ideal fasting and post-prandial blood glucose readings, individual ideal Hgb A1c goals and hypoglycemia prevention. We discussed the importance of good blood sugar control to decrease the likelihood of diabetic complications such as nephropathy, neuropathy, limb loss, blindness, coronary artery disease, and death. We discussed the importance of intensive lifestyle modification including diet, exercise and weight loss as the first line treatment for diabetes. Zuriyah agrees to continue taking metformin and she agrees to follow up with our clinic in 2 weeks.  Congestive Heart Failure Asyia agrees to continue her current medications and she agrees to follow up with our clinic in 2 weeks.  Obesity Obdulia is currently in the action stage of change. As such, her goal is to continue with weight loss efforts She has agreed to keep a food journal with 500-700 calories and 45+ grams of protein at supper daily and follow the Category 4 plan Zakkiyya has been instructed to  work up to a goal of 150 minutes of combined cardio and strengthening exercise per week for weight  loss and overall health benefits. We discussed the following Behavioral Modification Strategies today: increasing lean protein intake, increasing vegetables, work on meal planning and easy cooking plans, planning for success, and keep a strict food journal   Breunna has agreed to follow up with our clinic in 2 weeks. She was informed of the importance of frequent follow up visits to maximize her success with intensive lifestyle modifications for her multiple health conditions.   OBESITY BEHAVIORAL INTERVENTION VISIT  Today's visit was # 4 out of 22.  Starting weight: 468 lbs Starting date: 04/17/18 Today's weight : 442 lbs  Today's date: 05/30/2018 Total lbs lost to date: 50 (Patients must lose 7 lbs in the first 6 months to continue with counseling)   ASK: We discussed the diagnosis of obesity with Duwayne Heck today and Joyanne agreed to give [place] permission to discuss obesity behavioral modification therapy today.  ASSESS: Susie has the diagnosis of obesity and her BMI today is 80.82 Yoshie is in the action stage of change   ADVISE: Jaymie was educated on the multiple health risks of obesity as well as the benefit of weight loss to improve her health. She was advised of the need for long term treatment and the importance of lifestyle modifications.  AGREE: Multiple dietary modification options and treatment options were discussed and  [place] agreed to the above obesity treatment plan.  I, Trixie Dredge, am acting as transcriptionist for [name] Qua, MD  I have reviewed the above documentation for accuracy and completeness, and I agree with the above. - [name] Qua, MD

## 2018-06-08 ENCOUNTER — Telehealth: Payer: Self-pay | Admitting: *Deleted

## 2018-06-08 ENCOUNTER — Ambulatory Visit (HOSPITAL_COMMUNITY): Payer: BC Managed Care – PPO | Attending: Cardiology

## 2018-06-08 ENCOUNTER — Other Ambulatory Visit: Payer: Self-pay

## 2018-06-08 DIAGNOSIS — G4733 Obstructive sleep apnea (adult) (pediatric): Secondary | ICD-10-CM | POA: Diagnosis not present

## 2018-06-08 DIAGNOSIS — E785 Hyperlipidemia, unspecified: Secondary | ICD-10-CM | POA: Insufficient documentation

## 2018-06-08 DIAGNOSIS — I5042 Chronic combined systolic (congestive) and diastolic (congestive) heart failure: Secondary | ICD-10-CM | POA: Diagnosis present

## 2018-06-08 DIAGNOSIS — D649 Anemia, unspecified: Secondary | ICD-10-CM | POA: Diagnosis not present

## 2018-06-08 DIAGNOSIS — E669 Obesity, unspecified: Principal | ICD-10-CM

## 2018-06-08 DIAGNOSIS — I493 Ventricular premature depolarization: Secondary | ICD-10-CM | POA: Diagnosis not present

## 2018-06-08 DIAGNOSIS — I110 Hypertensive heart disease with heart failure: Secondary | ICD-10-CM | POA: Diagnosis not present

## 2018-06-08 DIAGNOSIS — E1169 Type 2 diabetes mellitus with other specified complication: Secondary | ICD-10-CM

## 2018-06-08 DIAGNOSIS — E039 Hypothyroidism, unspecified: Secondary | ICD-10-CM | POA: Insufficient documentation

## 2018-06-08 MED ORDER — METOPROLOL SUCCINATE ER 50 MG PO TB24: ORAL_TABLET | ORAL | 0 refills | Status: DC

## 2018-06-08 MED ORDER — PERFLUTREN LIPID MICROSPHERE
1.0000 mL | INTRAVENOUS | Status: AC | PRN
  Administered 2018-06-08: 3 mL via INTRAVENOUS

## 2018-06-08 MED ORDER — METFORMIN HCL ER 500 MG PO TB24: 500.0000 mg | ORAL_TABLET | Freq: Every day | ORAL | 0 refills | Status: DC

## 2018-06-08 NOTE — Telephone Encounter (Signed)
Copied from [place] [phone]. Topic: Inquiry >> Jun 08, 2018  2:54 PM Pricilla Handler wrote: Reason for CRM: Patient called requesting an Urgent Refills of MetFORMIN (GLUCOPHAGE-XR) 500 MG 24 hr tablet and Metoprolol succinate (TOPROL-XL) 50 MG 24 hr tablet. Patient states that she is completely out and only has transportation to pick up the proscriptions today. Patient did contact her pharmacy, which sent over the requests. Patient is requesting that Caesar Chestnut approve these refills within the next hour. I advised the patient that we could not guarantee this. Patient request is this could be filled for her today as a special urgent request. Please call patient at [phone].      Thank You!!!     Reviewed chart pt is up-to-date sent refills to [facility].Marland KitchenJohny Chess

## 2018-06-11 ENCOUNTER — Telehealth: Payer: Self-pay | Admitting: Cardiology

## 2018-06-11 ENCOUNTER — Ambulatory Visit (INDEPENDENT_AMBULATORY_CARE_PROVIDER_SITE_OTHER): Payer: BC Managed Care – PPO | Admitting: Family Medicine

## 2018-06-11 NOTE — Telephone Encounter (Signed)
Follow Up:; ° ° °Returning your call. °

## 2018-06-11 NOTE — Telephone Encounter (Signed)
Attempted to call the pt back and no answer.  Left a VM to call back.

## 2018-06-11 NOTE — Telephone Encounter (Signed)
Notes recorded by Nuala Alpha, LPN on 1/65/7903 at 8:33 PM EDT Pt made aware that per Dr Meda Coffee, her echo showed unchanged echo, low normal LVEF, otherwise normal.  Pt verbalized understanding.

## 2018-06-13 NOTE — Telephone Encounter (Signed)
Patient says the pharmacy did not receive the authorization/approval for metFORMIN (GLUCOPHAGE-XR) 500 MG 24 hr tablet.

## 2018-06-14 MED ORDER — METFORMIN HCL ER 500 MG PO TB24: 500.0000 mg | ORAL_TABLET | Freq: Every day | ORAL | 0 refills | Status: DC

## 2018-06-14 NOTE — Telephone Encounter (Signed)
Resent rx to [facility]../lmb 

## 2018-06-14 NOTE — Addendum Note (Signed)
Addended by: Earnstine Regal on: 06/14/2018 09:05 AM   Modules accepted: Orders

## 2018-06-17 ENCOUNTER — Ambulatory Visit (HOSPITAL_BASED_OUTPATIENT_CLINIC_OR_DEPARTMENT_OTHER): Payer: BC Managed Care – PPO | Attending: Pulmonary Disease | Admitting: Pulmonary Disease

## 2018-06-17 VITALS — Ht 62.0 in | Wt >= 6400 oz

## 2018-06-17 DIAGNOSIS — R0902 Hypoxemia: Secondary | ICD-10-CM | POA: Insufficient documentation

## 2018-06-17 DIAGNOSIS — G4733 Obstructive sleep apnea (adult) (pediatric): Secondary | ICD-10-CM

## 2018-06-18 ENCOUNTER — Ambulatory Visit (HOSPITAL_BASED_OUTPATIENT_CLINIC_OR_DEPARTMENT_OTHER): Payer: BC Managed Care – PPO | Attending: Pulmonary Disease | Admitting: Pulmonary Disease

## 2018-06-18 DIAGNOSIS — G47419 Narcolepsy without cataplexy: Secondary | ICD-10-CM | POA: Diagnosis not present

## 2018-06-18 DIAGNOSIS — G4733 Obstructive sleep apnea (adult) (pediatric): Secondary | ICD-10-CM | POA: Insufficient documentation

## 2018-06-20 DIAGNOSIS — G4733 Obstructive sleep apnea (adult) (pediatric): Secondary | ICD-10-CM

## 2018-06-20 NOTE — Procedures (Signed)
  Patient Name: Alexandra Beck, Alexandra Beck Date: 06/17/2018 Gender: Female D.O.B: Apr 26, 1981 Age (years): 18 Referring Provider: Kara Mead MD, ABSM Height (inches): 62 Interpreting Physician: Kara Mead MD, ABSM Weight (lbs): 442 RPSGT: Zadie Rhine BMI: 81 MRN: [id number] Neck Size: 19.50 <br> <br> CLINICAL INFORMATION The patient is referred for a CPAP titration to treat sleep apnea.    Date of NPSG:2009:  AHI 93/hr  SLEEP STUDY TECHNIQUE As per the AASM Manual for the Scoring of Sleep and Associated Events v2.3 (April 2016) with a hypopnea requiring 4% desaturations.  The channels recorded and monitored were frontal, central and occipital EEG, electrooculogram (EOG), submentalis EMG (chin), nasal and oral airflow, thoracic and abdominal wall motion, anterior tibialis EMG, snore microphone, electrocardiogram, and pulse oximetry. Continuous positive airway pressure (CPAP) was initiated at the beginning of the study and titrated to treat sleep-disordered breathing.  MEDICATIONS Medications self-administered by patient taken the night of the study : AMOXICILLIN  TECHNICIAN COMMENTS Comments added by technician: [place]. O2 initiated due to low sats.  RESPIRATORY PARAMETERS Optimal PAP Pressure (cm): 15 AHI at Optimal Pressure (/hr): 0.0 Overall Minimal O2 (%): 73.0 Supine % at Optimal Pressure (%): 0 Minimal O2 at Optimal Pressure (%): 92.0   SLEEP ARCHITECTURE The study was initiated at 10:56:27 PM and ended at 5:59:14 AM.  Sleep onset time was 6.6 minutes and the sleep efficiency was 91.5%%. The total sleep time was 387 minutes.  The patient spent 2.5%% of the night in stage N1 sleep, 53.9%% in stage N2 sleep, 22.0%% in stage N3 and 21.71% in REM.Stage REM latency was 117.5 minutes  Wake after sleep onset was 29.2. Alpha intrusion was absent. Supine sleep was 53.36%.  CARDIAC DATA The 2 lead EKG demonstrated sinus rhythm. The mean heart rate was 64.6 beats per  minute. Other EKG findings include: PVCs.   LEG MOVEMENT DATA The total Periodic Limb Movements of Sleep (PLMS) were 0. The PLMS index was 0.0. A PLMS index of <15 is considered normal in adults.  IMPRESSIONS - The optimal PAP pressure was 15 cm of water. - Central sleep apnea was not noted during this titration (CAI = 1.1/h). - Severe oxygen desaturations were observed during this titration (min O2 = 73.0%). - No snoring was audible during this study. - 2-lead EKG demonstrated: PVCs - Clinically significant periodic limb movements were not noted during this study. Arousals associated with PLMs were rare.   DIAGNOSIS - Obstructive Sleep Apnea (327.23 [G47.33 ICD-10]) -Nocturnal hypoxia   RECOMMENDATIONS - Trial of CPAP therapy on 15 cm H2O with a Small size Fisher&Paykel Full Face Mask Simplus mask and heated humidification. - Blend in 3L O2 into CPAP - Avoid alcohol, sedatives and other CNS depressants that may worsen sleep apnea and disrupt normal sleep architecture. - Sleep hygiene should be reviewed to assess factors that may improve sleep quality. - Weight management and regular exercise should be initiated or continued. - Return to Sleep Center for re-evaluation after 4 weeks of therapy   Kara Mead MD Board Certified in [place]

## 2018-06-20 NOTE — Telephone Encounter (Signed)
Reviewed titration study Please send in order for 3L O2 blended into CPAP, change CPAP pr to 15 cm Make OV to see me to discuss mSLT results

## 2018-06-20 NOTE — Procedures (Signed)
Patient Name: Alexandra Beck, Stantz Date: 06/18/2018 Gender: Female D.O.B: 11/15/1981 Age (years): 79 Referring Provider: Kara Mead MD, ABSM Height (inches): 62 Interpreting Physician: Kara Mead MD, ABSM Weight (lbs): 442 RPSGT: Jacolyn Reedy BMI: 81 MRN: [id number] Neck Size: 19.50 <br> <br> CLINICAL INFORMATION Sleep Study Type: MSLT  The patient was referred to the sleep center for evaluation of daytime sleepiness inspite of adequate CPAP use. MSLT was preceded by a CPAP titration night which showed need for CPAP 15 cm + 3L O2 blended in. She is maintained currently on CPAP 14 cm    Epworth Sleepiness Score: 16  SLEEP STUDY TECHNIQUE A Multiple Sleep Latency Test was performed after an overnight polysomnogram according to the AASM scoring manual v2.3 (April 2016) and clinical guidelines. Five nap opportunities occurred over the course of the test which followed an overnight polysomnogram. The channels recorded and monitored were frontal, central, and occipital electroencephalography (EEG), right and left electrooculogram (EOG), chin electromyography (EMG), and electrocardiogram (EKG).  MEDICATIONS Medications taken by the patient : AMOXICILLIN Medications administered by patient during sleep study : No sleep medicine administered.  IMPRESSIONS - Total number of naps attempted: 5 . Total number of naps with sleep attained: 5. The Mean Sleep Latency was 00:54 minutes. There were 3 sleep-onset REM periods. - The patient appears to have pathologic sleepiness, evidenced by a short mean sleep latency (8 minutes or less) on this MSLT. - No sleep onset REMs were noted during this MSLT.   DIAGNOSIS - Pathologic Sleepiness - Meets SOREM criteria for narcolepsy   RECOMMENDATIONS - Return for follow up and management of excessive daytime sleepiness. - If sleepiness persists inspite of adequate CPAP use with 3 L O2 blended in, then consider stimulant therapy.    Kara Mead  MD Board Certified in [place]

## 2018-06-21 ENCOUNTER — Ambulatory Visit (INDEPENDENT_AMBULATORY_CARE_PROVIDER_SITE_OTHER): Payer: BC Managed Care – PPO | Admitting: Family Medicine

## 2018-06-21 VITALS — BP 134/78 | HR 69 | Temp 98.8°F | Ht 62.0 in | Wt >= 6400 oz

## 2018-06-21 DIAGNOSIS — E119 Type 2 diabetes mellitus without complications: Secondary | ICD-10-CM

## 2018-06-21 DIAGNOSIS — Z9189 Other specified personal risk factors, not elsewhere classified: Secondary | ICD-10-CM | POA: Diagnosis not present

## 2018-06-21 DIAGNOSIS — E559 Vitamin D deficiency, unspecified: Secondary | ICD-10-CM

## 2018-06-21 DIAGNOSIS — I10 Essential (primary) hypertension: Secondary | ICD-10-CM | POA: Diagnosis not present

## 2018-06-21 DIAGNOSIS — Z6841 Body Mass Index (BMI) 40.0 and over, adult: Secondary | ICD-10-CM

## 2018-06-21 MED ORDER — VITAMIN D (ERGOCALCIFEROL) 1.25 MG (50000 UNIT) PO CAPS: 50000.0000 [IU] | ORAL_CAPSULE | ORAL | 0 refills | Status: DC

## 2018-06-22 ENCOUNTER — Other Ambulatory Visit: Payer: Self-pay | Admitting: *Deleted

## 2018-06-22 DIAGNOSIS — E039 Hypothyroidism, unspecified: Secondary | ICD-10-CM

## 2018-06-22 MED ORDER — LEVOTHYROXINE SODIUM 150 MCG PO TABS: ORAL_TABLET | ORAL | 1 refills | Status: DC

## 2018-06-25 NOTE — Progress Notes (Signed)
Office: [phone]  /  Fax: [phone]   HPI:   Chief Complaint: OBESITY Alexandra Beck is here to discuss her progress with her obesity treatment plan. She is on the  keep a food journal with 500 to 700 calories and 45+ grams of protein at supper daily and the Category 4 plan and is following her eating plan approximately 93 % of the time. She states she is exercising 0 minutes 0 times per week. Alexandra Beck enjoyed going to [employer] for her birthday and she had a cupcake the following day. Her weight is (!) 434 lb (196.9 kg) today and has had a weight loss of 8 pounds over a period of 3 weeks since her last visit. She has lost 34 lbs since starting treatment with [place].  Hypertension Alexandra Beck is a 37 y.o. female with hypertension. Her blood pressure is slightly elevated today (cuff size too small for arm). Alexandra Beck denies chest pain, chest pressure or headache. Losartan was increased to 100 mg by cardiology. She is working weight loss to help control her blood pressure with the goal of decreasing her risk of heart attack and stroke. Alexandra Beck blood pressure is not currently controlled.  Vitamin D deficiency Alexandra Beck has a diagnosis of vitamin D deficiency. Fatigue is improving. She is currently taking vit D and denies nausea, vomiting or muscle weakness.  At risk for osteopenia and osteoporosis Alexandra Beck is at higher risk of osteopenia and osteoporosis due to vitamin D deficiency.   Diabetes II Alexandra Beck has a diagnosis of diabetes type II. Alexandra Beck denies any hypoglycemic episodes or carb cravings. Last A1c was at 6.5 She has been working on intensive lifestyle modifications including diet, exercise, and weight loss to help control her blood glucose levels.  ALLERGIES: Allergies  Allergen Reactions  . Aspirin Hives and Swelling    REACTION: throat swelling, hives  . Doxycycline Other (See Comments)    Abdominal pain  . Lisinopril Cough  . Niaspan [Niacin Er]     Caused flushing     MEDICATIONS: Current Outpatient Medications on File Prior to Visit  Medication Sig Dispense Refill  . amoxicillin (AMOXIL) 500 MG capsule Take 500 mg by mouth 2 (two) times daily.    . furosemide (LASIX) 80 MG tablet Take 1 tablet (80 mg total) by mouth daily. 90 tablet 3  . losartan (COZAAR) 50 MG tablet Take 1 tablet (50 mg total) by mouth daily. 90 tablet 3  . metFORMIN (GLUCOPHAGE-XR) 500 MG 24 hr tablet Take 1 tablet (500 mg total) by mouth daily with breakfast. 90 tablet 0  . metoprolol succinate (TOPROL-XL) 50 MG 24 hr tablet TAKE 1 TABLET(50 MG) BY MOUTH DAILY. 90 tablet 0  . potassium chloride SA (K-DUR,KLOR-CON) 20 MEQ tablet TAKE 2 TABLETS(40 MEQ) BY MOUTH DAILY 60 tablet 1  . rosuvastatin (CRESTOR) 10 MG tablet Take 1 tablet (10 mg total) by mouth daily. 90 tablet 3   No current facility-administered medications on file prior to visit.     PAST MEDICAL HISTORY: Past Medical History:  Diagnosis Date  . Acute renal failure (ARF) ([place]) 11/2012    multifactorial-likely secondary to ATN in the setting of sepsis and hypotension, and also from rhabdomyolysis  . Anemia   . Cellulitis   . Chronic diastolic CHF (congestive heart failure) ([place])   . Depression   . Diabetes mellitus ([place])   . GERD (gastroesophageal reflux disease)   . Hyperlipidemia   . Hypertension   . Hypothyroidism   .  Morbid obesity with BMI of 70 and over, adult ([place])   . Obesity hypoventilation syndrome ([place])   . OSA (obstructive sleep apnea)   . PVC's (premature ventricular contractions)   . Recurrent cellulitis of lower leg    LLE, venous insuff  . Sepsis ([place]) 11/2012   Secondary to cellulitis  . Sleep apnea   . Venous insufficiency of leg     PAST SURGICAL HISTORY: Past Surgical History:  Procedure Laterality Date  . IR FLUORO GUIDE CV MIDLINE PICC RIGHT  03/28/2017  . IR US GUIDE VASC ACCESS RIGHT  03/28/2017  . WISDOM TOOTH EXTRACTION      SOCIAL HISTORY: Social History   Tobacco Use  .  Smoking status: Former Smoker    Years: 0.00  . Smokeless tobacco: Never Used  Substance Use Topics  . Alcohol use: No    Alcohol/week: 0.0 oz  . Drug use: No    FAMILY HISTORY: Family History  Problem Relation Age of Onset  . Hypertension Mother   . Diabetes Mother   . High Cholesterol Mother   . Thyroid disease Mother   . Sleep apnea Mother   . Obesity Mother   . Hypertension Father   . Heart disease Father        before age 79  . High Cholesterol Father   . Sleep apnea Father   . Obesity Father     ROS: Review of Systems  Constitutional: Positive for malaise/fatigue and weight loss.  Respiratory: Negative for shortness of breath (on exertion).   Cardiovascular: Negative for chest pain.       Negative for chest pressure  Gastrointestinal: Negative for nausea and vomiting.  Musculoskeletal:       Negative for muscle weakness  Neurological: Negative for headaches.  Endo/Heme/Allergies:       Negative for hypoglycemia Negative for carb cravings    PHYSICAL EXAM: Blood pressure 134/78, pulse 69, temperature 98.8 F (37.1 C), temperature source Oral, height 5\' 2"  (1.575 m), weight (!) 434 lb (196.9 kg), last menstrual period 06/07/2018, SpO2 96 %. Body mass index is 79.38 kg/m. Physical Exam  Constitutional: She is oriented to person, place, and time. She appears well-developed and well-nourished.  Cardiovascular: Normal rate.  Pulmonary/Chest: Effort normal.  Musculoskeletal: Normal range of motion.  Neurological: She is oriented to person, place, and time.  Skin: Skin is warm and dry.  Psychiatric: She has a normal mood and affect. Her behavior is normal.  Vitals reviewed.   RECENT LABS AND TESTS: BMET    Component Value Date/Time   NA 140 03/23/2018 1224   NA 140 12/16/2016 1438   K 4.1 03/23/2018 1224   CL 102 03/23/2018 1224   CO2 28 03/23/2018 1224   GLUCOSE 117 (H) 03/23/2018 1224   BUN 14 03/23/2018 1224   BUN 27 (H) 12/16/2016 1438    CREATININE 0.82 03/23/2018 1224   CREATININE 1.06 11/14/2016 1050   CALCIUM 9.5 03/23/2018 1224   CALCIUM 9.6 06/26/2007 0000   GFRNONAA >60 04/19/2017 0419   GFRAA >60 04/19/2017 0419   Lab Results  Component Value Date   HGBA1C 6.5 03/23/2018   HGBA1C 6.6 (H) 03/28/2017   HGBA1C 6.9 (H) 03/02/2016   HGBA1C 6.4 07/15/2013   HGBA1C 6.2 (H) 12/25/2012   Lab Results  Component Value Date   INSULIN 25.0 (H) 04/17/2018   CBC    Component Value Date/Time   WBC 7.9 03/23/2018 1224   RBC 5.65 (H) 03/23/2018 1224  HGB 14.8 03/23/2018 1224   HCT 46.7 (H) 03/23/2018 1224   PLT 230.0 03/23/2018 1224   MCV 82.7 03/23/2018 1224   MCV 91.7 12/23/2012 2054   MCH 26.8 04/19/2017 0419   MCHC 31.6 03/23/2018 1224   RDW 18.6 (H) 03/23/2018 1224   LYMPHSABS 0.8 04/16/2017 2351   MONOABS 0.7 04/16/2017 2351   EOSABS 0.1 04/16/2017 2351   BASOSABS 0.0 04/16/2017 2351   Iron/TIBC/Ferritin/ %Sat No results found for: IRON, TIBC, FERRITIN, IRONPCTSAT Lipid Panel     Component Value Date/Time   CHOL 186 03/23/2018 1224   TRIG 148.0 03/23/2018 1224   HDL 48.50 03/23/2018 1224   CHOLHDL 4 03/23/2018 1224   VLDL 29.6 03/23/2018 1224   LDLCALC 108 (H) 03/23/2018 1224   LDLDIRECT 141.0 03/02/2016 1101   Hepatic Function Panel     Component Value Date/Time   PROT 7.7 03/23/2018 1224   ALBUMIN 3.9 03/23/2018 1224   AST 12 03/23/2018 1224   ALT 10 03/23/2018 1224   ALKPHOS 70 03/23/2018 1224   BILITOT 0.5 03/23/2018 1224   BILIDIR 0.1 08/30/2016 1413   IBILI 0.5 12/25/2012 0223      Component Value Date/Time   TSH 1.600 04/17/2018 1239   TSH 3.96 03/23/2018 1224   TSH 1.310 12/16/2016 1438   Results for AMILEY, SHISHIDO (MRN [id number]) as of 06/25/2018 10:00  Ref. Range 04/17/2018 12:39  Vitamin D, 25-Hydroxy Latest Ref Range: 30.0 - 100.0 ng/mL 11.4 (L)   ASSESSMENT AND PLAN: Essential hypertension  Vitamin D deficiency - Plan: Vitamin D, Ergocalciferol, (DRISDOL) 50000 units  CAPS capsule  Type 2 diabetes mellitus without complication, without long-term current use of insulin (HCC)  At risk for osteoporosis  Class 3 severe obesity with serious comorbidity and body mass index (BMI) greater than or equal to 70 in adult, unspecified obesity type ([place])  PLAN:  Hypertension We discussed sodium restriction, working on healthy weight loss, and a regular exercise program as the means to achieve improved blood pressure control. Alexandra Beck agreed with this plan and agreed to follow up as directed. We will continue to monitor her blood pressure as well as her progress with the above lifestyle modifications. She will continue her medications as prescribed and will watch for signs of hypotension as she continues her lifestyle modifications.  Vitamin D Deficiency Alexandra Beck was informed that low vitamin D levels contributes to fatigue and are associated with obesity, breast, and colon cancer. She agrees to continue to take prescription Vit D @50 ,000 IU every week #4 with no refills and will follow up for routine testing of vitamin D, at least 2-3 times per year. She was informed of the risk of over-replacement of vitamin D and agrees to not increase her dose unless she discusses this with [place] first. Alexandra Beck agrees to follow up as directed./  At risk for osteopenia and osteoporosis Alexandra Beck is at risk for osteopenia and osteoporosis due to her vitamin D deficiency. She was encouraged to take her vitamin D and follow her higher calcium diet and increase strengthening exercise to help strengthen her bones and decrease her risk of osteopenia and osteoporosis.  Diabetes II Alexandra Beck has been given extensive diabetes education by myself today including ideal fasting and post-prandial blood glucose readings, individual ideal Hgb A1c goals and hypoglycemia prevention. We discussed the importance of good blood sugar control to decrease the likelihood of diabetic complications such as nephropathy, neuropathy,  limb loss, blindness, coronary artery disease, and death. We discussed the importance of  intensive lifestyle modification including diet, exercise and weight loss as the first line treatment for diabetes. Alexandra Beck agrees to continue her diabetes medications and will follow up at the agreed upon time. We will discuss Victoza in September.  Obesity Alexandra Beck is currently in the action stage of change. As such, her goal is to continue with weight loss efforts She has agreed to keep a food journal with 400 to 500 calories and 35+ grams of protein at lunch daily and follow the Category 3 plan Alexandra Beck has been instructed to work up to a goal of 150 minutes of combined cardio and strengthening exercise per week for weight loss and overall health benefits. We discussed the following Behavioral Modification Strategies today: planning for success, increasing lean protein intake, increasing vegetables and work on meal planning and easy cooking plans  Alexandra Beck has agreed to follow up with our clinic in 2 weeks. She was informed of the importance of frequent follow up visits to maximize her success with intensive lifestyle modifications for her multiple health conditions.   OBESITY BEHAVIORAL INTERVENTION VISIT  Today's visit was # 5 out of 22.  Starting weight: 468 lbs Starting date: 04/17/18 Today's weight : 434 lbs Today's date: 06/21/2018 Total lbs lost to date: 64    ASK: We discussed the diagnosis of obesity with Alexandra Beck today and Samhita agreed to give [place] permission to discuss obesity behavioral modification therapy today.  ASSESS: Alexandra Beck has the diagnosis of obesity and her BMI today is 79.36 Alexandra Beck is in the action stage of change   ADVISE: Calleen was educated on the multiple health risks of obesity as well as the benefit of weight loss to improve her health. She was advised of the need for long term treatment and the importance of lifestyle modifications.  AGREE: Multiple dietary modification  options and treatment options were discussed and  Marleah agreed to the above obesity treatment plan.  I, Doreene Nest, am acting as transcriptionist for Eber Jones, MD  I have reviewed the above documentation for accuracy and completeness, and I agree with the above. - [name] Qua, MD

## 2018-06-26 ENCOUNTER — Encounter: Payer: Self-pay | Admitting: Pulmonary Disease

## 2018-06-26 DIAGNOSIS — G4734 Idiopathic sleep related nonobstructive alveolar hypoventilation: Secondary | ICD-10-CM

## 2018-06-26 DIAGNOSIS — G4733 Obstructive sleep apnea (adult) (pediatric): Secondary | ICD-10-CM

## 2018-06-26 NOTE — Telephone Encounter (Signed)
Spoke with patient. She has been scheduled to see RA on 06/29/18 at 230. Orders have been placed. Nothing further needed at time of call.

## 2018-06-27 NOTE — Telephone Encounter (Signed)
RA - please advise. Thanks! 

## 2018-06-28 NOTE — Telephone Encounter (Signed)
Titration study showed requirement of 3 L of oxygen limited into CPAP.  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 Plan would be - If sleepiness persists inspite of adequate CPAP use with 3 L O2 blended in, then consider stimulant therapy.

## 2018-06-28 NOTE — Telephone Encounter (Signed)
Spoke with patient again since she had stated that Thursdays work best for her. Patient now wants to know if RA would be willing to discuss her results via [place]. She is having a hard time financially now and can not afford the $80 copay for tomorrow. She prefers communication via Pharmacist, community since she is at work during the day.   If RA is not ok with this, she will try to keep her appt for 06/29/18 at 230.   RA, please advise. Thanks!

## 2018-06-29 ENCOUNTER — Telehealth: Payer: Self-pay | Admitting: Pulmonary Disease

## 2018-06-29 ENCOUNTER — Ambulatory Visit: Payer: Self-pay | Admitting: Pulmonary Disease

## 2018-06-29 NOTE — Telephone Encounter (Signed)
Called and spoke with [facility] [phone] Per Kern Reap pt is needing ov with RA within 30 days for O2 order, and last ov was 05/10/18  Called spoke with pt in need to schedule ov with RA next week Scheduled pt for ov with RA 07/04/18 at 11am Nothing further needed

## 2018-07-04 ENCOUNTER — Encounter: Payer: Self-pay | Admitting: Pulmonary Disease

## 2018-07-04 ENCOUNTER — Other Ambulatory Visit: Payer: Self-pay | Admitting: Internal Medicine

## 2018-07-04 ENCOUNTER — Telehealth: Payer: Self-pay | Admitting: Pulmonary Disease

## 2018-07-04 ENCOUNTER — Ambulatory Visit: Payer: BC Managed Care – PPO | Admitting: Pulmonary Disease

## 2018-07-04 VITALS — BP 122/68 | HR 60 | Ht 62.0 in | Wt >= 6400 oz

## 2018-07-04 DIAGNOSIS — G47419 Narcolepsy without cataplexy: Secondary | ICD-10-CM | POA: Diagnosis not present

## 2018-07-04 DIAGNOSIS — L03116 Cellulitis of left lower limb: Secondary | ICD-10-CM

## 2018-07-04 DIAGNOSIS — E662 Morbid (severe) obesity with alveolar hypoventilation: Secondary | ICD-10-CM

## 2018-07-04 DIAGNOSIS — L03119 Cellulitis of unspecified part of limb: Secondary | ICD-10-CM

## 2018-07-04 DIAGNOSIS — G4733 Obstructive sleep apnea (adult) (pediatric): Secondary | ICD-10-CM | POA: Diagnosis not present

## 2018-07-04 DIAGNOSIS — G4734 Idiopathic sleep related nonobstructive alveolar hypoventilation: Secondary | ICD-10-CM | POA: Diagnosis not present

## 2018-07-04 DIAGNOSIS — L03115 Cellulitis of right lower limb: Secondary | ICD-10-CM

## 2018-07-04 MED ORDER — MODAFINIL 200 MG PO TABS: ORAL_TABLET | ORAL | 0 refills | Status: DC

## 2018-07-04 NOTE — Assessment & Plan Note (Signed)
Blend in 3 L of oxygen into CPAP. If symptoms persist then eventually may consider ABG on room air in the daytime

## 2018-07-04 NOTE — Patient Instructions (Signed)
Change auto CPAP settings to 12 to 20 cm. We will send in prescription for 3 L oxygen blended into CPAP.  We discussed findings of sleep studies including possibility of narcolepsy. Emphasize scheduled wake up and sleep times and an exercise regimen. If possible, try to squeeze ig 1 hour nap into the day  Prescription for Provigil 200 mg around 11 AM once daily #30 ( to start after 2 weeks )

## 2018-07-04 NOTE — Telephone Encounter (Signed)
Order has been sent to [facility] through community message

## 2018-07-04 NOTE — Assessment & Plan Note (Signed)
We discussed findings of sleep studies including possibility of narcolepsy. Emphasize scheduled wake up and sleep times and an exercise regimen. If possible, try to squeeze in 1 hour nap into the day  Prescription for Provigil 200 mg around 11 AM once daily #30 ( to start after 2 weeks )  If too expensive or not approved and will consider alternatives

## 2018-07-04 NOTE — Assessment & Plan Note (Signed)
Change auto CPAP settings to 12 to 20 cm. We will send in prescription for 3 L oxygen blended into CPAP.  Weight loss encouraged, compliance with goal of at least 4-6 hrs every night is the expectation. Advised against medications with sedative side effects Cautioned against driving when sleepy - understanding that sleepiness will vary on a day to day basis

## 2018-07-04 NOTE — Telephone Encounter (Signed)
Order placed today for CPAP settings and blended 3L O2 per Dr Elsworth Soho  Will route to [facility] to follow up

## 2018-07-04 NOTE — Progress Notes (Signed)
   Subjective:    Patient ID: Alexandra Beck, female    DOB: April 28, 1981, 37 y.o.   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 OSA was diagnosed when  in college  She has a diagnosis of chronic diastolic heart failure, EF in 2001 was as low as 20% but last check 08/2016 was about 55%.  She takes 80 mg of Lasix daily   CPAP download  04/2018 Good compliance ,Average pressure of 14 cm. Few residual AHI 7/hour, but given that her baseline was 93/hour that seems okay. Minimal leak.  We reviewed download and titration study which showed that she also required 3 L of oxygen.  We reviewed the possibility of obesity hypoventilation syndrome.  She has lost about 20 pounds since her initial visit. 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  She has not had any symptoms of cataplexy or sleep paralysis     Significant tests/ events reviewed  NPSG 2009:  AHI 93/hr On auto 5-20cm   ABG  06/2014 showing mild hypercarbia 7.35/50 1/90 5/97%   CPAP titration 05/2018 >> CPAP 15 + 3L O2 (434 lbs )  MSLT >> SOREMs x3     Past Medical History:  Diagnosis Date  . Acute renal failure (ARF) ([place]) 11/2012    multifactorial-likely secondary to ATN in the setting of sepsis and hypotension, and also from rhabdomyolysis  . Anemia   . Cellulitis   . Chronic diastolic CHF (congestive heart failure) ([place])   . Depression   . Diabetes mellitus ([place])   . GERD (gastroesophageal reflux disease)   . Hyperlipidemia   . Hypertension   . Hypothyroidism   . Morbid obesity with BMI of 70 and over, adult ([place])   . Obesity hypoventilation syndrome ([place])   . OSA (obstructive sleep apnea)   . PVC's (premature ventricular contractions)   . Recurrent cellulitis of lower leg    LLE, venous insuff  . Sepsis ([place]) 11/2012   Secondary to  cellulitis  . Sleep apnea   . Venous insufficiency of leg      Review of Systems neg for any significant sore throat, dysphagia, itching, sneezing, nasal congestion or excess/ purulent secretions, fever, chills, sweats, unintended wt loss, pleuritic or exertional cp, hempoptysis, orthopnea pnd or change in chronic leg swelling. Also denies presyncope, palpitations, heartburn, abdominal pain, nausea, vomiting, diarrhea or change in bowel or urinary habits, dysuria,hematuria, rash, arthralgias, visual complaints, headache, numbness weakness or ataxia.     Objective:   Physical Exam  Gen. Pleasant, obese, in no distress ENT - no lesions, no post nasal drip Neck: No JVD, no thyromegaly, no carotid bruits Lungs: no use of accessory muscles, no dullness to percussion, decreased without rales or rhonchi  Cardiovascular: Rhythm regular, heart sounds  normal, no murmurs or gallops, no peripheral edema Musculoskeletal: No deformities, no cyanosis or clubbing , no tremors       Assessment & Plan:

## 2018-07-05 ENCOUNTER — Ambulatory Visit (INDEPENDENT_AMBULATORY_CARE_PROVIDER_SITE_OTHER): Payer: BC Managed Care – PPO | Admitting: Family Medicine

## 2018-07-05 VITALS — BP 137/69 | HR 57 | Temp 98.3°F | Ht 62.0 in | Wt >= 6400 oz

## 2018-07-05 DIAGNOSIS — Z6841 Body Mass Index (BMI) 40.0 and over, adult: Secondary | ICD-10-CM

## 2018-07-05 DIAGNOSIS — Z9189 Other specified personal risk factors, not elsewhere classified: Secondary | ICD-10-CM | POA: Diagnosis not present

## 2018-07-05 DIAGNOSIS — E559 Vitamin D deficiency, unspecified: Secondary | ICD-10-CM | POA: Diagnosis not present

## 2018-07-05 MED ORDER — VITAMIN D (ERGOCALCIFEROL) 1.25 MG (50000 UNIT) PO CAPS: 50000.0000 [IU] | ORAL_CAPSULE | ORAL | 0 refills | Status: DC

## 2018-07-06 ENCOUNTER — Other Ambulatory Visit: Payer: Self-pay | Admitting: Physician Assistant

## 2018-07-06 DIAGNOSIS — I509 Heart failure, unspecified: Secondary | ICD-10-CM

## 2018-07-09 ENCOUNTER — Telehealth: Payer: Self-pay

## 2018-07-09 ENCOUNTER — Other Ambulatory Visit: Payer: Self-pay

## 2018-07-09 MED ORDER — ROSUVASTATIN CALCIUM 10 MG PO TABS: 10.0000 mg | ORAL_TABLET | Freq: Every day | ORAL | 0 refills | Status: DC

## 2018-07-09 NOTE — Telephone Encounter (Signed)
According to Alexandra Beck's last note, she was taking Jardiance. I know the weight loss doctor had asked her to consider making a change to Victoza.  But yes, I do think she is supposed to be on it. Can you call and get her to verify? Just let her know that medication list is still having some glitches from what Marya Amsler prescribed last year and now getting everything into Alexandra Beck's name.

## 2018-07-09 NOTE — Telephone Encounter (Signed)
Received fax from [facility] on [address] requesting refill of jardiance. I do not see this medication currently on list. Please advise if she is supposed to continue this.

## 2018-07-09 NOTE — Progress Notes (Signed)
Office: [phone]  /  Fax: [phone]   HPI:   Chief Complaint: OBESITY Alexandra Beck is here to discuss her progress with her obesity treatment plan. She is on the  keep a food journal with 400 to 500 calories and 35+ grams of protein at lunch daily and follow the Category 3 plan and is following her eating plan approximately 92 % of the time. She states she is exercising 0 minutes 0 times per week. Alexandra Beck continues to do very well with weight loss. She knows she is doing well, but then has very negative self talk and feels her efforts are not enough. Her weight is (!) 429 lb (194.6 kg) today and has had a weight loss of 5 pounds over a period of 2 weeks since her last visit. She has lost 33 lbs since starting treatment with [place].  Vitamin D deficiency Alexandra Beck has a diagnosis of vitamin D deficiency. Alexandra Beck is stable on vit D, but she is not yet at goal. Alexandra Beck denies nausea, vomiting or muscle weakness.  At risk for osteopenia and osteoporosis Alexandra Beck is at higher risk of osteopenia and osteoporosis due to vitamin D deficiency.   ALLERGIES: Allergies  Allergen Reactions  . Aspirin Hives and Swelling    REACTION: throat swelling, hives  . Doxycycline Other (See Comments)    Abdominal pain  . Lisinopril Cough  . Niaspan [Niacin Er]     Caused flushing    MEDICATIONS: Current Outpatient Medications on File Prior to Visit  Medication Sig Dispense Refill  . amoxicillin (AMOXIL) 500 MG capsule Take 500 mg by mouth 2 (two) times daily.    . furosemide (LASIX) 80 MG tablet Take 1 tablet (80 mg total) by mouth daily. 90 tablet 3  . levothyroxine (SYNTHROID, LEVOTHROID) 150 MCG tablet TAKE 1 TABLET(150 MCG) BY MOUTH DAILY BEFORE BREAKFAST. 90 tablet 1  . losartan (COZAAR) 50 MG tablet Take 1 tablet (50 mg total) by mouth daily. 90 tablet 3  . metFORMIN (GLUCOPHAGE-XR) 500 MG 24 hr tablet Take 1 tablet (500 mg total) by mouth daily with breakfast. 90 tablet 0  . metoprolol succinate (TOPROL-XL) 50  MG 24 hr tablet TAKE 1 TABLET(50 MG) BY MOUTH DAILY. 90 tablet 0  . modafinil (PROVIGIL) 200 MG tablet Take 1 tablet daily around 11am. 30 tablet 0  . rosuvastatin (CRESTOR) 10 MG tablet Take 1 tablet (10 mg total) by mouth daily. 90 tablet 3  . cephALEXin (KEFLEX) 500 MG capsule TAKE 1 CAPSULE(500 MG) BY MOUTH FOUR TIMES DAILY (Patient not taking: Reported on 07/05/2018) 20 capsule 1   No current facility-administered medications on file prior to visit.     PAST MEDICAL HISTORY: Past Medical History:  Diagnosis Date  . Acute renal failure (ARF) ([place]) 11/2012    multifactorial-likely secondary to ATN in the setting of sepsis and hypotension, and also from rhabdomyolysis  . Anemia   . Cellulitis   . Chronic diastolic CHF (congestive heart failure) ([place])   . Depression   . Diabetes mellitus ([place])   . GERD (gastroesophageal reflux disease)   . Hyperlipidemia   . Hypertension   . Hypothyroidism   . Morbid obesity with BMI of 70 and over, adult ([place])   . Obesity hypoventilation syndrome ([place])   . OSA (obstructive sleep apnea)   . PVC's (premature ventricular contractions)   . Recurrent cellulitis of lower leg    LLE, venous insuff  . Sepsis ([place]) 11/2012   Secondary to cellulitis  . Sleep apnea   .  Venous insufficiency of leg     PAST SURGICAL HISTORY: Past Surgical History:  Procedure Laterality Date  . IR FLUORO GUIDE CV MIDLINE PICC RIGHT  03/28/2017  . IR US GUIDE VASC ACCESS RIGHT  03/28/2017  . WISDOM TOOTH EXTRACTION      SOCIAL HISTORY: Social History   Tobacco Use  . Smoking status: Former Smoker    Years: 0.00  . Smokeless tobacco: Never Used  Substance Use Topics  . Alcohol use: No    Alcohol/week: 0.0 standard drinks  . Drug use: No    FAMILY HISTORY: Family History  Problem Relation Age of Onset  . Hypertension Mother   . Diabetes Mother   . High Cholesterol Mother   . Thyroid disease Mother   . Sleep apnea Mother   . Obesity Mother   . Hypertension  Father   . Heart disease Father        before age 20  . High Cholesterol Father   . Sleep apnea Father   . Obesity Father     ROS: Review of Systems  Constitutional: Positive for weight loss.  Gastrointestinal: Negative for nausea and vomiting.  Musculoskeletal:       Negative for muscle weakness    PHYSICAL EXAM: Blood pressure 137/69, pulse (!) 57, temperature 98.3 F (36.8 C), temperature source Oral, height 5\' 2"  (1.575 m), weight (!) 429 lb (194.6 kg), last menstrual period 06/07/2018, SpO2 93 %. Body mass index is 78.47 kg/m. Physical Exam  Constitutional: She is oriented to person, place, and time. She appears well-developed and well-nourished.  Cardiovascular: Normal rate.  Pulmonary/Chest: Effort normal.  Musculoskeletal: Normal range of motion.  Neurological: She is oriented to person, place, and time.  Skin: Skin is warm and dry.  Psychiatric: She has a normal mood and affect. Her behavior is normal.  Vitals reviewed.   RECENT LABS AND TESTS: BMET    Component Value Date/Time   NA 140 03/23/2018 1224   NA 140 12/16/2016 1438   K 4.1 03/23/2018 1224   CL 102 03/23/2018 1224   CO2 28 03/23/2018 1224   GLUCOSE 117 (H) 03/23/2018 1224   BUN 14 03/23/2018 1224   BUN 27 (H) 12/16/2016 1438   CREATININE 0.82 03/23/2018 1224   CREATININE 1.06 11/14/2016 1050   CALCIUM 9.5 03/23/2018 1224   CALCIUM 9.6 06/26/2007 0000   GFRNONAA >60 04/19/2017 0419   GFRAA >60 04/19/2017 0419   Lab Results  Component Value Date   HGBA1C 6.5 03/23/2018   HGBA1C 6.6 (H) 03/28/2017   HGBA1C 6.9 (H) 03/02/2016   HGBA1C 6.4 07/15/2013   HGBA1C 6.2 (H) 12/25/2012   Lab Results  Component Value Date   INSULIN 25.0 (H) 04/17/2018   CBC    Component Value Date/Time   WBC 7.9 03/23/2018 1224   RBC 5.65 (H) 03/23/2018 1224   HGB 14.8 03/23/2018 1224   HCT 46.7 (H) 03/23/2018 1224   PLT 230.0 03/23/2018 1224   MCV 82.7 03/23/2018 1224   MCV 91.7 12/23/2012 2054   MCH  26.8 04/19/2017 0419   MCHC 31.6 03/23/2018 1224   RDW 18.6 (H) 03/23/2018 1224   LYMPHSABS 0.8 04/16/2017 2351   MONOABS 0.7 04/16/2017 2351   EOSABS 0.1 04/16/2017 2351   BASOSABS 0.0 04/16/2017 2351   Iron/TIBC/Ferritin/ %Sat No results found for: IRON, TIBC, FERRITIN, IRONPCTSAT Lipid Panel     Component Value Date/Time   CHOL 186 03/23/2018 1224   TRIG 148.0 03/23/2018 1224   HDL  48.50 03/23/2018 1224   CHOLHDL 4 03/23/2018 1224   VLDL 29.6 03/23/2018 1224   LDLCALC 108 (H) 03/23/2018 1224   LDLDIRECT 141.0 03/02/2016 1101   Hepatic Function Panel     Component Value Date/Time   PROT 7.7 03/23/2018 1224   ALBUMIN 3.9 03/23/2018 1224   AST 12 03/23/2018 1224   ALT 10 03/23/2018 1224   ALKPHOS 70 03/23/2018 1224   BILITOT 0.5 03/23/2018 1224   BILIDIR 0.1 08/30/2016 1413   IBILI 0.5 12/25/2012 0223      Component Value Date/Time   TSH 1.600 04/17/2018 1239   TSH 3.96 03/23/2018 1224   TSH 1.310 12/16/2016 1438   Results for JACQUIE, LUKES (MRN [id number]) as of 07/09/2018 08:29  Ref. Range 04/17/2018 12:39  Vitamin D, 25-Hydroxy Latest Ref Range: 30.0 - 100.0 ng/mL 11.4 (L)   ASSESSMENT AND PLAN: Vitamin D deficiency - Plan: Vitamin D, Ergocalciferol, (DRISDOL) 50000 units CAPS capsule  At risk for osteoporosis  Class 3 severe obesity with serious comorbidity and body mass index (BMI) greater than or equal to 70 in adult, unspecified obesity type (HCC)  PLAN:  Vitamin D Deficiency Alexandra Beck was informed that low vitamin D levels contributes to fatigue and are associated with obesity, breast, and colon cancer. She agrees to continue to take prescription Vit D @50 ,000 IU every week #4 with no refills and will follow up for routine testing of vitamin D, at least 2-3 times per year. She was informed of the risk of over-replacement of vitamin D and agrees to not increase her dose unless she discusses this with [place] first. Alexandra Beck agrees to follow up as directed.  At risk for  osteopenia and osteoporosis Alexandra Beck is at risk for osteopenia and osteoporosis due to her vitamin D deficiency. She was encouraged to take her vitamin D and follow her higher calcium diet and increase strengthening exercise to help strengthen her bones and decrease her risk of osteopenia and osteoporosis.  Obesity Alexandra Beck is currently in the action stage of change. As such, her goal is to continue with weight loss efforts She has agreed to keep a food journal with 400 to 500 calories and 35 grams of protein at lunch daily and follow the Category 3 plan Alexandra Beck has been instructed to work up to a goal of 150 minutes of combined cardio and strengthening exercise per week for weight loss and overall health benefits. We discussed the following Behavioral Modification Strategies today: increasing lean protein intake and decreasing simple carbohydrates   Alexandra Beck has agreed to follow up with our clinic in 2 to 3 weeks. She was informed of the importance of frequent follow up visits to maximize her success with intensive lifestyle modifications for her multiple health conditions.   OBESITY BEHAVIORAL INTERVENTION VISIT  Today's visit was # 6 out of 22.  Starting weight: 462 lbs Starting date: 04/17/18 Today's weight : 429 lbs Today's date: 07/05/2018 Total lbs lost to date: 62    ASK: We discussed the diagnosis of obesity with Alexandra Beck today and Alexandra Beck agreed to give [place] permission to discuss obesity behavioral modification therapy today.  ASSESS: Alexandra Beck has the diagnosis of obesity and her BMI today is 78.45 Alexandra Beck is in the action stage of change   ADVISE: Alexandra Beck was educated on the multiple health risks of obesity as well as the benefit of weight loss to improve her health. She was advised of the need for long term treatment and the importance of lifestyle modifications.  AGREE: Multiple dietary modification options and treatment options were discussed and  Alexandra Beck agreed to the above obesity  treatment plan.  I, Doreene Nest, am acting as transcriptionist for Dennard Nip, MD  I have reviewed the above documentation for accuracy and completeness, and I agree with the above. -Dennard Nip, MD

## 2018-07-10 ENCOUNTER — Telehealth: Payer: Self-pay | Admitting: Pulmonary Disease

## 2018-07-10 NOTE — Telephone Encounter (Signed)
The side effects would be true for any stimulant. That is why we would start at the lower dose and monitor her for any of the side effects That is also why I recommended stimulant only after trying other alternatives

## 2018-07-10 NOTE — Telephone Encounter (Signed)
Dr. Elsworth Soho, please advise on pt email.  Thanks!

## 2018-07-10 NOTE — Telephone Encounter (Signed)
PA started for Modafinil 200mg  07/10/18, with CMM. [name] (Key: [id number])   Your information has been submitted to [place]. To check for an updated outcome later, reopen this PA request from your dashboard. If [employer] has not responded to your request within 24 hours, contact [place] at [phone]. If you think there may be a problem with your PA request, use our live chat feature at the bottom right.   Will route to [place], to follow up

## 2018-07-12 NOTE — Telephone Encounter (Signed)
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 And advise if okay to start her med a few days earlier than planned:  I have been sleeping with 3L of O2 since last Wednesday. It has made getting up in the morning a bit easier and I have felt more alertness upon waking. I've been waking before my alarm and getting up around 9am, which is unusual for me. However, by midday, 2-3pm, I am falling asleep at my desk here at work.   I switch to my regular semester schedule of working 1pm-10pm on Sunday. Could I go ahead and start the modafinil on that day? Or do I need to wait until next Wednesday?

## 2018-07-12 NOTE — Telephone Encounter (Signed)
Sent mychart message

## 2018-07-12 NOTE — Telephone Encounter (Signed)
Okay to start modafinil on Sunday

## 2018-07-17 NOTE — Telephone Encounter (Signed)
[facility] [employer]  received a request from your provider for coverage of Modafinil 200MG  OR TABS. As long as you remain covered by the [facility] and there are no changes to your plan benefits, this request is approved for the following time period: 07/10/2018 - 07/11/2019  I have faxed approval information to [place].   Left message for patient stating that her PA was approved.

## 2018-07-25 ENCOUNTER — Ambulatory Visit (INDEPENDENT_AMBULATORY_CARE_PROVIDER_SITE_OTHER): Payer: BC Managed Care – PPO | Admitting: Family Medicine

## 2018-07-25 VITALS — BP 122/68 | HR 57 | Temp 98.4°F | Ht 62.0 in | Wt >= 6400 oz

## 2018-07-25 DIAGNOSIS — Z9189 Other specified personal risk factors, not elsewhere classified: Secondary | ICD-10-CM

## 2018-07-25 DIAGNOSIS — E559 Vitamin D deficiency, unspecified: Secondary | ICD-10-CM | POA: Diagnosis not present

## 2018-07-25 DIAGNOSIS — Z6841 Body Mass Index (BMI) 40.0 and over, adult: Secondary | ICD-10-CM

## 2018-07-25 DIAGNOSIS — I5032 Chronic diastolic (congestive) heart failure: Secondary | ICD-10-CM | POA: Diagnosis not present

## 2018-07-25 MED ORDER — VITAMIN D (ERGOCALCIFEROL) 1.25 MG (50000 UNIT) PO CAPS: 50000.0000 [IU] | ORAL_CAPSULE | ORAL | 0 refills | Status: DC

## 2018-07-25 NOTE — Progress Notes (Signed)
Office: [phone]  /  Fax: [phone]   HPI:   Chief Complaint: OBESITY Alexandra Beck is here to discuss her progress with her obesity treatment plan. She is on the  keep a food journal with 400-500  calories and 35+ grams of protein  lunch and category 4 is following her eating plan approximately 93 % of the time. She states she is exercising 0 minutes 0 times per week. 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 She is beginning to get slightly anxious about pictures at the wedding.  Her weight is (!) 418 lb (189.6 kg) today and has had a weight loss of 11 pounds over a period of 3 weeks since her last visit. She has lost 50 lbs since starting treatment with [place].  Vitamin D deficiency Alexandra Beck has a diagnosis of vitamin D deficiency. She is currently taking vit D and denies nausea, vomiting or muscle weakness, but admits to fatigue.    Ref. Range 04/17/2018 12:39  Vitamin D, 25-Hydroxy Latest Ref Range: 30.0 - 100.0 ng/mL 11.4 (L)   Congestive Heart Failure  Alexandra Beck has a diagnosis of CHF. Her blood pressure is controlled, but her heart rate is bradycardic. Denies any shortness of breath or dyspnea.   At risk for osteopenia and osteoporosis Alexandra Beck is at higher risk of osteopenia and osteoporosis due to vitamin D deficiency.   ALLERGIES: Allergies  Allergen Reactions  . Aspirin Hives and Swelling    REACTION: throat swelling, hives  . Doxycycline Other (See Comments)    Abdominal pain  . Lisinopril Cough  . Niaspan [Niacin Er]     Caused flushing    MEDICATIONS: Current Outpatient Medications on File Prior to Visit  Medication Sig Dispense Refill  . amoxicillin (AMOXIL) 500 MG capsule Take 500 mg by mouth 2 (two) times daily.    . cephALEXin (KEFLEX) 500 MG capsule TAKE 1 CAPSULE(500 MG) BY MOUTH FOUR TIMES DAILY 20 capsule 1  . furosemide (LASIX) 80 MG tablet Take 1 tablet (80 mg total) by mouth daily. 90 tablet 3  . levothyroxine (SYNTHROID, LEVOTHROID) 150 MCG tablet TAKE 1  TABLET(150 MCG) BY MOUTH DAILY BEFORE BREAKFAST. 90 tablet 1  . losartan (COZAAR) 50 MG tablet Take 1 tablet (50 mg total) by mouth daily. 90 tablet 3  . metFORMIN (GLUCOPHAGE-XR) 500 MG 24 hr tablet Take 1 tablet (500 mg total) by mouth daily with breakfast. 90 tablet 0  . metoprolol succinate (TOPROL-XL) 50 MG 24 hr tablet TAKE 1 TABLET(50 MG) BY MOUTH DAILY. 90 tablet 0  . modafinil (PROVIGIL) 200 MG tablet Take 1 tablet daily around 11am. 30 tablet 0  . potassium chloride SA (K-DUR,KLOR-CON) 20 MEQ tablet TAKE 2 TABLETS(40 MEQ) BY MOUTH DAILY 60 tablet 11  . rosuvastatin (CRESTOR) 10 MG tablet Take 1 tablet (10 mg total) by mouth daily. Needs updated labs for more refills. 30 tablet 0   No current facility-administered medications on file prior to visit.     PAST MEDICAL HISTORY: Past Medical History:  Diagnosis Date  . Acute renal failure (ARF) ([place]) 11/2012    multifactorial-likely secondary to ATN in the setting of sepsis and hypotension, and also from rhabdomyolysis  . Anemia   . Cellulitis   . Chronic diastolic CHF (congestive heart failure) ([place])   . Depression   . Diabetes mellitus ([place])   . GERD (gastroesophageal reflux disease)   . Hyperlipidemia   . Hypertension   . Hypothyroidism   . Morbid obesity with BMI of 70 and  over, adult [facility])   . Obesity hypoventilation syndrome ([place])   . OSA (obstructive sleep apnea)   . PVC's (premature ventricular contractions)   . Recurrent cellulitis of lower leg    LLE, venous insuff  . Sepsis ([place]) 11/2012   Secondary to cellulitis  . Sleep apnea   . Venous insufficiency of leg     PAST SURGICAL HISTORY: Past Surgical History:  Procedure Laterality Date  . IR FLUORO GUIDE CV MIDLINE PICC RIGHT  03/28/2017  . IR US GUIDE VASC ACCESS RIGHT  03/28/2017  . WISDOM TOOTH EXTRACTION      SOCIAL HISTORY: Social History   Tobacco Use  . Smoking status: Former Smoker    Years: 0.00  . Smokeless tobacco: Never Used  Substance Use  Topics  . Alcohol use: No    Alcohol/week: 0.0 standard drinks  . Drug use: No    FAMILY HISTORY: Family History  Problem Relation Age of Onset  . Hypertension Mother   . Diabetes Mother   . High Cholesterol Mother   . Thyroid disease Mother   . Sleep apnea Mother   . Obesity Mother   . Hypertension Father   . Heart disease Father        before age 90  . High Cholesterol Father   . Sleep apnea Father   . Obesity Father     ROS: Review of Systems  Constitutional: Positive for malaise/fatigue and weight loss.  Respiratory: Negative for shortness of breath.   Gastrointestinal: Negative for nausea and vomiting.  Musculoskeletal:       Negative for muscle weakness    PHYSICAL EXAM: Blood pressure 122/68, pulse (!) 57, temperature 98.4 F (36.9 C), temperature source Oral, height 5\' 2"  (1.575 m), weight (!) 418 lb (189.6 kg), SpO2 93 %. Body mass index is 76.45 kg/m. Physical Exam  Constitutional: She is oriented to person, place, and time. She appears well-developed and well-nourished.  HENT:  Head: Normocephalic.  Eyes: Pupils are equal, round, and reactive to light. EOM are normal.  Neck: Normal range of motion.  Cardiovascular: Normal rate.  Pulmonary/Chest: Effort normal.  Musculoskeletal: Normal range of motion.  Neurological: She is alert and oriented to person, place, and time.  Skin: Skin is warm and dry.  Psychiatric: She has a normal mood and affect. Her behavior is normal.  Vitals reviewed.   RECENT LABS AND TESTS: BMET    Component Value Date/Time   NA 140 03/23/2018 1224   NA 140 12/16/2016 1438   K 4.1 03/23/2018 1224   CL 102 03/23/2018 1224   CO2 28 03/23/2018 1224   GLUCOSE 117 (H) 03/23/2018 1224   BUN 14 03/23/2018 1224   BUN 27 (H) 12/16/2016 1438   CREATININE 0.82 03/23/2018 1224   CREATININE 1.06 11/14/2016 1050   CALCIUM 9.5 03/23/2018 1224   CALCIUM 9.6 06/26/2007 0000   GFRNONAA >60 04/19/2017 0419   GFRAA >60 04/19/2017 0419    Lab Results  Component Value Date   HGBA1C 6.5 03/23/2018   HGBA1C 6.6 (H) 03/28/2017   HGBA1C 6.9 (H) 03/02/2016   HGBA1C 6.4 07/15/2013   HGBA1C 6.2 (H) 12/25/2012   Lab Results  Component Value Date   INSULIN 25.0 (H) 04/17/2018   CBC    Component Value Date/Time   WBC 7.9 03/23/2018 1224   RBC 5.65 (H) 03/23/2018 1224   HGB 14.8 03/23/2018 1224   HCT 46.7 (H) 03/23/2018 1224   PLT 230.0 03/23/2018 1224   MCV 82.7  03/23/2018 1224   MCV 91.7 12/23/2012 2054   MCH 26.8 04/19/2017 0419   MCHC 31.6 03/23/2018 1224   RDW 18.6 (H) 03/23/2018 1224   LYMPHSABS 0.8 04/16/2017 2351   MONOABS 0.7 04/16/2017 2351   EOSABS 0.1 04/16/2017 2351   BASOSABS 0.0 04/16/2017 2351   Iron/TIBC/Ferritin/ %Sat No results found for: IRON, TIBC, FERRITIN, IRONPCTSAT Lipid Panel     Component Value Date/Time   CHOL 186 03/23/2018 1224   TRIG 148.0 03/23/2018 1224   HDL 48.50 03/23/2018 1224   CHOLHDL 4 03/23/2018 1224   VLDL 29.6 03/23/2018 1224   LDLCALC 108 (H) 03/23/2018 1224   LDLDIRECT 141.0 03/02/2016 1101   Hepatic Function Panel     Component Value Date/Time   PROT 7.7 03/23/2018 1224   ALBUMIN 3.9 03/23/2018 1224   AST 12 03/23/2018 1224   ALT 10 03/23/2018 1224   ALKPHOS 70 03/23/2018 1224   BILITOT 0.5 03/23/2018 1224   BILIDIR 0.1 08/30/2016 1413   IBILI 0.5 12/25/2012 0223      Component Value Date/Time   TSH 1.600 04/17/2018 1239   TSH 3.96 03/23/2018 1224   TSH 1.310 12/16/2016 1438     Ref. Range 04/17/2018 12:39  Vitamin D, 25-Hydroxy Latest Ref Range: 30.0 - 100.0 ng/mL 11.4 (L)   ASSESSMENT AND PLAN: Vitamin D deficiency - Plan: VITAMIN D 25 Hydroxy (Vit-D Deficiency, Fractures), Vitamin D, Ergocalciferol, (DRISDOL) 50000 units CAPS capsule  Chronic diastolic CHF (congestive heart failure) (HCC) - Plan: Lipid Panel With LDL/HDL Ratio, Insulin, random, Hemoglobin A1c, CBC With Differential, Comprehensive metabolic panel  At risk for  osteoporosis  Class 3 severe obesity with serious comorbidity and body mass index (BMI) greater than or equal to 70 in adult, unspecified obesity type (HCC)  PLAN: Vitamin D Deficiency Alexandra Beck was informed that low vitamin D levels contributes to fatigue and are associated with obesity, breast, and colon cancer. She agrees to continue to take prescription Vit D @50 ,000 IU every week #4 with no refills and will follow up for routine testing of vitamin D, at least 2-3 times per year. She was informed of the risk of over-replacement of vitamin D and agrees to not increase her dose unless she discusses this with [place] first. She agrees to follow up with our clinic as directed. We will draw labs today.   Congestive Heart Failure Alexandra Beck was informed to continue current medications and agrees to follow up with our clinic as directed. We will draw labs today.   Obesity Alexandra Beck is currently in the action stage of change. As such, her goal is to continue with weight loss efforts She has agreed to keep a food journal with 400-500 calories and 35+ grams of protein  at lunch and category 4. Alexandra Beck has been instructed to work up to a goal of 150 minutes of combined cardio and strengthening exercise per week for weight loss and overall health benefits. We discussed the following Behavioral Modification Strategies today: increasing lean protein intake, increasing vegetables, work on meal planning and easy cooking plans, better snacking choices, planning for success, and keeping a strict food journal.    Alexandra Beck has agreed to follow up with our clinic in 2 weeks. She was informed of the importance of frequent follow up visits to maximize her success with intensive lifestyle modifications for her multiple health conditions.   OBESITY BEHAVIORAL INTERVENTION VISIT  Today's visit was # 7   Starting weight: 468 lb Starting date: 04/17/18 Today's weight : 418 lb  Today's date: 07/25/2018 Total lbs lost to date: 50 lb At  least 15 minutes were spent on discussing the following behavioral intervention visit.   ASK: We discussed the diagnosis of obesity with Alexandra Beck today and Alexandra Beck agreed to give [place] permission to discuss obesity behavioral modification therapy today.  ASSESS: Alexandra Beck has the diagnosis of obesity and her BMI today is 76.43 Alexandra Beck is in the action stage of change   ADVISE: Alexandra Beck was educated on the multiple health risks of obesity as well as the benefit of weight loss to improve her health. She was advised of the need for long term treatment and the importance of lifestyle modifications to improve her current health and to decrease her risk of future health problems.  AGREE: Multiple dietary modification options and treatment options were discussed and  Alexandra Beck agreed to follow the recommendations documented in the above note.  ARRANGE: Alexandra Beck was educated on the importance of frequent visits to treat obesity as outlined per CMS and USPSTF guidelines and agreed to schedule her next follow up appointment today.  I, Renee Ramus, am acting as transcriptionist for [name] Qua, MD  I have reviewed the above documentation for accuracy and completeness, and I agree with the above. - [name] Qua, MD

## 2018-07-26 LAB — CBC WITH DIFFERENTIAL
BASOS ABS: 0 10*3/uL (ref 0.0–0.2)
Basos: 1 %
EOS (ABSOLUTE): 0.2 10*3/uL (ref 0.0–0.4)
Eos: 3 %
Hematocrit: 44.8 % (ref 34.0–46.6)
Hemoglobin: 14.8 g/dL (ref 11.1–15.9)
IMMATURE GRANS (ABS): 0 10*3/uL (ref 0.0–0.1)
Immature Granulocytes: 0 %
Lymphocytes Absolute: 1.4 10*3/uL (ref 0.7–3.1)
Lymphs: 23 %
MCH: 29.2 pg (ref 26.6–33.0)
MCHC: 33 g/dL (ref 31.5–35.7)
MCV: 88 fL (ref 79–97)
Monocytes Absolute: 0.5 10*3/uL (ref 0.1–0.9)
Monocytes: 9 %
NEUTROS PCT: 64 %
Neutrophils Absolute: 3.8 10*3/uL (ref 1.4–7.0)
RBC: 5.07 x10E6/uL (ref 3.77–5.28)
RDW: 15.6 % — AB (ref 12.3–15.4)
WBC: 5.9 10*3/uL (ref 3.4–10.8)

## 2018-07-26 LAB — COMPREHENSIVE METABOLIC PANEL
ALT: 21 IU/L (ref 0–32)
AST: 18 IU/L (ref 0–40)
Albumin/Globulin Ratio: 1.4 (ref 1.2–2.2)
Albumin: 4 g/dL (ref 3.5–5.5)
Alkaline Phosphatase: 73 IU/L (ref 39–117)
BUN/Creatinine Ratio: 25 — ABNORMAL HIGH (ref 9–23)
BUN: 21 mg/dL — AB (ref 6–20)
Bilirubin Total: 0.5 mg/dL (ref 0.0–1.2)
CALCIUM: 9.6 mg/dL (ref 8.7–10.2)
CO2: 25 mmol/L (ref 20–29)
Chloride: 100 mmol/L (ref 96–106)
Creatinine, Ser: 0.84 mg/dL (ref 0.57–1.00)
GFR, EST AFRICAN AMERICAN: 102 mL/min/{1.73_m2} (ref >59)
GFR, EST NON AFRICAN AMERICAN: 88 mL/min/{1.73_m2} (ref >59)
GLUCOSE: 96 mg/dL (ref 65–99)
Globulin, Total: 2.9 g/dL (ref 1.5–4.5)
Potassium: 4.5 mmol/L (ref 3.5–5.2)
Sodium: 141 mmol/L (ref 134–144)
TOTAL PROTEIN: 6.9 g/dL (ref 6.0–8.5)

## 2018-07-26 LAB — LIPID PANEL WITH LDL/HDL RATIO
Cholesterol, Total: 147 mg/dL (ref 100–199)
HDL: 42 mg/dL (ref >39)
LDL Calculated: 77 mg/dL (ref 0–99)
LDl/HDL Ratio: 1.8 ratio (ref 0.0–3.2)
Triglycerides: 141 mg/dL (ref 0–149)
VLDL Cholesterol Cal: 28 mg/dL (ref 5–40)

## 2018-07-26 LAB — HEMOGLOBIN A1C
ESTIMATED AVERAGE GLUCOSE: 120 mg/dL
HEMOGLOBIN A1C: 5.8 % — AB (ref 4.8–5.6)

## 2018-07-26 LAB — INSULIN, RANDOM: INSULIN: 24.7 u[IU]/mL (ref 2.6–24.9)

## 2018-07-26 LAB — VITAMIN D 25 HYDROXY (VIT D DEFICIENCY, FRACTURES): Vit D, 25-Hydroxy: 35 ng/mL (ref 30.0–100.0)

## 2018-08-05 ENCOUNTER — Other Ambulatory Visit: Payer: Self-pay | Admitting: Nurse Practitioner

## 2018-08-07 ENCOUNTER — Telehealth: Payer: Self-pay | Admitting: Pulmonary Disease

## 2018-08-07 NOTE — Telephone Encounter (Signed)
LMOMTCB x 1 

## 2018-08-07 NOTE — Telephone Encounter (Signed)
She states she she has felt sluggish in the afternoon sometimes and has fell asleep one afternoon while on med. 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

## 2018-08-08 NOTE — Telephone Encounter (Signed)
Pt returning call. Pt contact number [phone]

## 2018-08-08 NOTE — Telephone Encounter (Signed)
Called and spoke with patient, she stated that she is wanting to up her dosage of the modafinil due feeling like she is only staying awake during her work shift. Some days she feels like it doesn't work at all. She feels sluggish. There have been a few days when she has fallen asleep during her work shift. Due to RA being out of the office, Dr. Vaughan Browner please advise on this, thank you.

## 2018-08-08 NOTE — Telephone Encounter (Signed)
Called and spoke with pt letting her know that RA was out of the office until Monday, 9/15. Pt stated it was okay to wait until he returned for this to be addressed, whether the dosage be changed.  Pt also stated she will need a refill of the med as well.  Routing to Dr. Elsworth Soho.

## 2018-08-08 NOTE — Telephone Encounter (Signed)
Attempted to call pt. I did not receive an answer. I have left a message for pt to return our call.  

## 2018-08-08 NOTE — Telephone Encounter (Signed)
I am not familiar with this medication. You can ask one of the other sleep docs to review or wait till Dr. Elsworth Soho is back in clinic. I feel it can wait till then as it is not an urgent issue.  Marshell Garfinkel MD [facility] 08/08/2018, 3:26 PM

## 2018-08-09 ENCOUNTER — Ambulatory Visit (INDEPENDENT_AMBULATORY_CARE_PROVIDER_SITE_OTHER): Payer: BC Managed Care – PPO | Admitting: Family Medicine

## 2018-08-09 VITALS — BP 125/55 | HR 64 | Temp 98.2°F | Ht 62.0 in | Wt >= 6400 oz

## 2018-08-09 DIAGNOSIS — E559 Vitamin D deficiency, unspecified: Secondary | ICD-10-CM | POA: Diagnosis not present

## 2018-08-09 DIAGNOSIS — Z6841 Body Mass Index (BMI) 40.0 and over, adult: Secondary | ICD-10-CM

## 2018-08-09 DIAGNOSIS — G4733 Obstructive sleep apnea (adult) (pediatric): Secondary | ICD-10-CM

## 2018-08-09 DIAGNOSIS — Z9189 Other specified personal risk factors, not elsewhere classified: Secondary | ICD-10-CM

## 2018-08-09 DIAGNOSIS — E119 Type 2 diabetes mellitus without complications: Secondary | ICD-10-CM | POA: Diagnosis not present

## 2018-08-09 MED ORDER — VITAMIN D (ERGOCALCIFEROL) 1.25 MG (50000 UNIT) PO CAPS: 50000.0000 [IU] | ORAL_CAPSULE | ORAL | 0 refills | Status: DC

## 2018-08-13 ENCOUNTER — Other Ambulatory Visit: Payer: Self-pay | Admitting: *Deleted

## 2018-08-13 MED ORDER — MODAFINIL 100 MG PO TABS: ORAL_TABLET | ORAL | 0 refills | Status: DC

## 2018-08-13 NOTE — Telephone Encounter (Signed)
lmtcb x1 for pt. 

## 2018-08-13 NOTE — Telephone Encounter (Signed)
Okay to increase modafinil dose from 200 to 300 mg daily x 4 weeks If still no effect, then will consider alternative medication

## 2018-08-13 NOTE — Telephone Encounter (Signed)
Per RA 08/07/18 - Okay to increase modafinil dose from 200 to 300 mg daily x 4 weeks If still no effect, then will consider alternative medication  Patient called requesting modafinil.  Prescription call ed to preferred pharmacy [facility]. Patient notified, nothing further at this time.

## 2018-08-14 NOTE — Progress Notes (Signed)
Office: [phone]  /  Fax: [phone]   HPI:   Chief Complaint: OBESITY Alexandra Beck is here to discuss her progress with her obesity treatment plan. She is on the keep a food journal with 400-500 calories and 35+ grams of protein at lunch daily and follow the Category 4 plan and is following her eating plan approximately 95 % of the time. She states she is exercising 0 minutes 0 times per week. Alexandra Beck notes some stress with upcoming nuptials. She indulged in [place] [employer] chicken last week and chicken wings.  Her weight is (!) 416 lb (188.7 kg) today and has had a weight loss of 2 pounds over a period of 2 weeks since her last visit. She has lost 52 lbs since starting treatment with [place].  Vitamin D Deficiency Alexandra Beck has a diagnosis of vitamin D deficiency. She is currently taking prescription Vit D. She notes fatigue and denies nausea, vomiting or muscle weakness.  At risk for osteopenia and osteoporosis Alexandra Beck is at higher risk of osteopenia and osteoporosis due to vitamin D deficiency.   Diabetes II Alexandra Beck has a diagnosis of diabetes type II. Alexandra Beck's last labs shows improvement in Hgb A1c and minor improvement in insulin. She denies any hypoglycemic episodes. She has been working on intensive lifestyle modifications including diet, exercise, and weight loss to help control her blood glucose levels.  Obstructive Sleep Apnea Alexandra Beck has obstructive sleep apnea, she is wearing CPAP every night. She denies increased shortness of breath or orthopnea. She sees Pulmonology.  ALLERGIES: Allergies  Allergen Reactions  . Aspirin Hives and Swelling    REACTION: throat swelling, hives  . Doxycycline Other (See Comments)    Abdominal pain  . Lisinopril Cough  . Niaspan [Niacin Er]     Caused flushing    MEDICATIONS: Current Outpatient Medications on File Prior to Visit  Medication Sig Dispense Refill  . amoxicillin (AMOXIL) 500 MG capsule Take 500 mg by mouth 2 (two) times daily.    . cephALEXin  (KEFLEX) 500 MG capsule TAKE 1 CAPSULE(500 MG) BY MOUTH FOUR TIMES DAILY 20 capsule 1  . furosemide (LASIX) 80 MG tablet Take 1 tablet (80 mg total) by mouth daily. 90 tablet 3  . levothyroxine (SYNTHROID, LEVOTHROID) 150 MCG tablet TAKE 1 TABLET(150 MCG) BY MOUTH DAILY BEFORE BREAKFAST. 90 tablet 1  . losartan (COZAAR) 50 MG tablet Take 1 tablet (50 mg total) by mouth daily. 90 tablet 3  . metFORMIN (GLUCOPHAGE-XR) 500 MG 24 hr tablet Take 1 tablet (500 mg total) by mouth daily with breakfast. 90 tablet 0  . metoprolol succinate (TOPROL-XL) 50 MG 24 hr tablet TAKE 1 TABLET(50 MG) BY MOUTH DAILY. 90 tablet 0  . modafinil (PROVIGIL) 200 MG tablet Take 1 tablet daily around 11am. 30 tablet 0  . potassium chloride SA (K-DUR,KLOR-CON) 20 MEQ tablet TAKE 2 TABLETS(40 MEQ) BY MOUTH DAILY 60 tablet 11  . rosuvastatin (CRESTOR) 10 MG tablet TAKE 1 TABLET(10 MG) BY MOUTH DAILY 30 tablet 0   No current facility-administered medications on file prior to visit.     PAST MEDICAL HISTORY: Past Medical History:  Diagnosis Date  . Acute renal failure (ARF) ([place]) 11/2012    multifactorial-likely secondary to ATN in the setting of sepsis and hypotension, and also from rhabdomyolysis  . Anemia   . Cellulitis   . Chronic diastolic CHF (congestive heart failure) ([place])   . Depression   . Diabetes mellitus ([place])   . GERD (gastroesophageal reflux disease)   .  Hyperlipidemia   . Hypertension   . Hypothyroidism   . Morbid obesity with BMI of 70 and over, adult ([place])   . Obesity hypoventilation syndrome ([place])   . OSA (obstructive sleep apnea)   . PVC's (premature ventricular contractions)   . Recurrent cellulitis of lower leg    LLE, venous insuff  . Sepsis ([place]) 11/2012   Secondary to cellulitis  . Sleep apnea   . Venous insufficiency of leg     PAST SURGICAL HISTORY: Past Surgical History:  Procedure Laterality Date  . IR FLUORO GUIDE CV MIDLINE PICC RIGHT  03/28/2017  . IR US GUIDE VASC ACCESS RIGHT   03/28/2017  . WISDOM TOOTH EXTRACTION      SOCIAL HISTORY: Social History   Tobacco Use  . Smoking status: Former Smoker    Years: 0.00  . Smokeless tobacco: Never Used  Substance Use Topics  . Alcohol use: No    Alcohol/week: 0.0 standard drinks  . Drug use: No    FAMILY HISTORY: Family History  Problem Relation Age of Onset  . Hypertension Mother   . Diabetes Mother   . High Cholesterol Mother   . Thyroid disease Mother   . Sleep apnea Mother   . Obesity Mother   . Hypertension Father   . Heart disease Father        before age 46  . High Cholesterol Father   . Sleep apnea Father   . Obesity Father     ROS: Review of Systems  Constitutional: Positive for malaise/fatigue and weight loss.  Respiratory: Negative for shortness of breath.   Cardiovascular: Negative for orthopnea.  Gastrointestinal: Negative for nausea and vomiting.  Musculoskeletal:       Negative muscle weakness  Endo/Heme/Allergies:       Negative hypoglycemia    PHYSICAL EXAM: Blood pressure (!) 125/55, pulse 64, temperature 98.2 F (36.8 C), temperature source Oral, height 5\' 2"  (1.575 m), weight (!) 416 lb (188.7 kg), last menstrual period 07/22/2018, SpO2 90 %. Body mass index is 76.09 kg/m. Physical Exam  Constitutional: She is oriented to person, place, and time. She appears well-developed and well-nourished.  Cardiovascular: Normal rate.  Pulmonary/Chest: Effort normal.  Musculoskeletal: Normal range of motion.  Neurological: She is oriented to person, place, and time.  Skin: Skin is warm and dry.  Psychiatric: She has a normal mood and affect. Her behavior is normal.  Vitals reviewed.   RECENT LABS AND TESTS: BMET    Component Value Date/Time   NA 141 07/25/2018 1234   K 4.5 07/25/2018 1234   CL 100 07/25/2018 1234   CO2 25 07/25/2018 1234   GLUCOSE 96 07/25/2018 1234   GLUCOSE 117 (H) 03/23/2018 1224   BUN 21 (H) 07/25/2018 1234   CREATININE 0.84 07/25/2018 1234    CREATININE 1.06 11/14/2016 1050   CALCIUM 9.6 07/25/2018 1234   CALCIUM 9.6 06/26/2007 0000   GFRNONAA 88 07/25/2018 1234   GFRAA 102 07/25/2018 1234   Lab Results  Component Value Date   HGBA1C 5.8 (H) 07/25/2018   HGBA1C 6.5 03/23/2018   HGBA1C 6.6 (H) 03/28/2017   HGBA1C 6.9 (H) 03/02/2016   HGBA1C 6.4 07/15/2013   Lab Results  Component Value Date   INSULIN 24.7 07/25/2018   INSULIN 25.0 (H) 04/17/2018   CBC    Component Value Date/Time   WBC 5.9 07/25/2018 1234   WBC 7.9 03/23/2018 1224   RBC 5.07 07/25/2018 1234   RBC 5.65 (H) 03/23/2018 1224  HGB 14.8 07/25/2018 1234   HCT 44.8 07/25/2018 1234   PLT 230.0 03/23/2018 1224   MCV 88 07/25/2018 1234   MCH 29.2 07/25/2018 1234   MCH 26.8 04/19/2017 0419   MCHC 33.0 07/25/2018 1234   MCHC 31.6 03/23/2018 1224   RDW 15.6 (H) 07/25/2018 1234   LYMPHSABS 1.4 07/25/2018 1234   MONOABS 0.7 04/16/2017 2351   EOSABS 0.2 07/25/2018 1234   BASOSABS 0.0 07/25/2018 1234   Iron/TIBC/Ferritin/ %Sat No results found for: IRON, TIBC, FERRITIN, IRONPCTSAT Lipid Panel     Component Value Date/Time   CHOL 147 07/25/2018 1234   TRIG 141 07/25/2018 1234   HDL 42 07/25/2018 1234   CHOLHDL 4 03/23/2018 1224   VLDL 29.6 03/23/2018 1224   LDLCALC 77 07/25/2018 1234   LDLDIRECT 141.0 03/02/2016 1101   Hepatic Function Panel     Component Value Date/Time   PROT 6.9 07/25/2018 1234   ALBUMIN 4.0 07/25/2018 1234   AST 18 07/25/2018 1234   ALT 21 07/25/2018 1234   ALKPHOS 73 07/25/2018 1234   BILITOT 0.5 07/25/2018 1234   BILIDIR 0.1 08/30/2016 1413   IBILI 0.5 12/25/2012 0223      Component Value Date/Time   TSH 1.600 04/17/2018 1239   TSH 3.96 03/23/2018 1224   TSH 1.310 12/16/2016 1438  Results for KYUNG, MUTO (MRN [id number]) as of 08/14/2018 10:21  Ref. Range 07/25/2018 12:34  Vitamin D, 25-Hydroxy Latest Ref Range: 30.0 - 100.0 ng/mL 35.0    ASSESSMENT AND PLAN: Vitamin D deficiency - Plan: Vitamin D,  Ergocalciferol, (DRISDOL) 50000 units CAPS capsule  Type 2 diabetes mellitus without complication, without long-term current use of insulin (HCC)  OSA (obstructive sleep apnea)  At risk for osteoporosis  Class 3 severe obesity with serious comorbidity and body mass index (BMI) greater than or equal to 70 in adult, unspecified obesity type ([place])  PLAN:  Vitamin D Deficiency Alexandra Beck was informed that low vitamin D levels contributes to fatigue and are associated with obesity, breast, and colon cancer. Alexandra Beck agrees to continue taking prescription Vit D @50 ,000 IU every week #4 and we will refill for 1 month. She will follow up for routine testing of vitamin D, at least 2-3 times per year. She was informed of the risk of over-replacement of vitamin D and agrees to not increase her dose unless she discusses this with [place] first. Alexandra Beck agrees to follow up with our clinic in 2-3 weeks.  At risk for osteopenia and osteoporosis Alexandra Beck was given extended  (15 minutes) osteoporosis prevention counseling today. Alexandra Beck is at risk for osteopenia and osteoporsis due to her vitamin D deficiency. She was encouraged to take her vitamin D and follow her higher calcium diet and increase strengthening exercise to help strengthen her bones and decrease her risk of osteopenia and osteoporosis.  Diabetes II Alexandra Beck has been given extensive diabetes education by myself today including ideal fasting and post-prandial blood glucose readings, individual ideal Hgb A1c goals and hypoglycemia prevention. We discussed the importance of good blood sugar control to decrease the likelihood of diabetic complications such as nephropathy, neuropathy, limb loss, blindness, coronary artery disease, and death. We discussed the importance of intensive lifestyle modification including diet, exercise and weight loss as the first line treatment for diabetes. Alexandra Beck agrees to continue taking metformin 500 mg, no refill needed, and she agrees to  follow up with our clinic in 2 to 3 weeks.  Obstructive Sleep Apnea Alexandra Beck will follow up with Pulmonology per  her previous scheduled appointment. Alexandra Beck agrees to follow up with our clinic in 2 to 3 weeks.  Obesity Alexandra Beck is currently in the action stage of change. As such, her goal is to continue with weight loss efforts She has agreed to keep a food journal with 400-500 calories and 35+ grams of protein at lunch daily and follow the Category 4 plan Alexandra Beck has been instructed to work up to a goal of 150 minutes of combined cardio and strengthening exercise per week for weight loss and overall health benefits. We discussed the following Behavioral Modification Strategies today: increasing lean protein intake, increasing vegetables, work on meal planning and easy cooking plans, better snacking choices, planning for success, and keep a strict food journal   Alexandra Beck has agreed to follow up with our clinic in 2 to 3 weeks. She was informed of the importance of frequent follow up visits to maximize her success with intensive lifestyle modifications for her multiple health conditions.   OBESITY BEHAVIORAL INTERVENTION VISIT  Today's visit was # 8   Starting weight: 468 lbs Starting date: 04/17/18 Today's weight : 416 lbs  Today's date: 08/09/2018 Total lbs lost to date: 52    ASK: We discussed the diagnosis of obesity with Alexandra Beck today and Alexandra Beck agreed to give [place] permission to discuss obesity behavioral modification therapy today.  ASSESS: Alexandra Beck has the diagnosis of obesity and her BMI today is 76.07 Alexandra Beck is in the action stage of change   ADVISE: Alexandra Beck was educated on the multiple health risks of obesity as well as the benefit of weight loss to improve her health. She was advised of the need for long term treatment and the importance of lifestyle modifications to improve her current health and to decrease her risk of future health problems.  AGREE: Multiple dietary modification  options and treatment options were discussed and  Alexandra Beck agreed to follow the recommendations documented in the above note.  ARRANGE: Alexandra Beck was educated on the importance of frequent visits to treat obesity as outlined per CMS and USPSTF guidelines and agreed to schedule her next follow up appointment today.  I, Trixie Dredge, am acting as transcriptionist for [name] Qua, MD  I have reviewed the above documentation for accuracy and completeness, and I agree with the above. - [name] Qua, MD

## 2018-08-15 ENCOUNTER — Encounter (INDEPENDENT_AMBULATORY_CARE_PROVIDER_SITE_OTHER): Payer: Self-pay | Admitting: Family Medicine

## 2018-08-22 ENCOUNTER — Other Ambulatory Visit: Payer: Self-pay | Admitting: Nurse Practitioner

## 2018-08-22 ENCOUNTER — Encounter: Payer: Self-pay | Admitting: Nurse Practitioner

## 2018-08-22 MED ORDER — TRAMADOL HCL 50 MG PO TABS: 50.0000 mg | ORAL_TABLET | Freq: Three times a day (TID) | ORAL | 0 refills | Status: DC | PRN

## 2018-08-28 ENCOUNTER — Ambulatory Visit (INDEPENDENT_AMBULATORY_CARE_PROVIDER_SITE_OTHER): Payer: BC Managed Care – PPO | Admitting: Family Medicine

## 2018-08-28 VITALS — BP 125/66 | HR 62 | Temp 98.0°F | Ht 62.0 in | Wt >= 6400 oz

## 2018-08-28 DIAGNOSIS — E559 Vitamin D deficiency, unspecified: Secondary | ICD-10-CM

## 2018-08-28 DIAGNOSIS — I5032 Chronic diastolic (congestive) heart failure: Secondary | ICD-10-CM

## 2018-08-28 DIAGNOSIS — Z6841 Body Mass Index (BMI) 40.0 and over, adult: Secondary | ICD-10-CM | POA: Diagnosis not present

## 2018-08-29 ENCOUNTER — Ambulatory Visit: Payer: BC Managed Care – PPO | Admitting: Internal Medicine

## 2018-08-29 NOTE — Progress Notes (Signed)
Office: [phone]  /  Fax: [phone]   HPI:   Chief Complaint: OBESITY Alexandra Beck is here to discuss her progress with her obesity treatment plan. She is on the keep a food journal with 400-500 calories and 35+ grams of protein at lunch daily and follow the Category 4 plan and is following her eating plan approximately 80 % of the time. She states she is exercising 0 minutes 0 times per week. [name] recently got married. She was able to eat only a bit at the wedding, but she was able to eat after the wedding.  Her weight is (!) 404 lb (183.3 kg) today and has had a weight loss of 12 pounds over a period of 2 to 3 weeks since her last visit. She has lost 64 lbs since starting treatment with [place].  Vitamin D Deficiency [name] has a diagnosis of vitamin D deficiency. She is currently taking prescription Vit D. She notes fatigue and denies nausea, vomiting or muscle weakness.  Congestive Heart Failure [name] has congestive heart failure. She denies dyspnea or edema, and she is on a CHF regimen.  ALLERGIES: Allergies  Allergen Reactions  . Aspirin Hives and Swelling    REACTION: throat swelling, hives  . Doxycycline Other (See Comments)    Abdominal pain  . Lisinopril Cough  . Niaspan [Niacin Er]     Caused flushing    MEDICATIONS: Current Outpatient Medications on File Prior to Visit  Medication Sig Dispense Refill  . amoxicillin (AMOXIL) 500 MG capsule Take 500 mg by mouth 2 (two) times daily.    . cephALEXin (KEFLEX) 500 MG capsule TAKE 1 CAPSULE(500 MG) BY MOUTH FOUR TIMES DAILY 20 capsule 1  . furosemide (LASIX) 80 MG tablet Take 1 tablet (80 mg total) by mouth daily. 90 tablet 3  . levothyroxine (SYNTHROID, LEVOTHROID) 150 MCG tablet TAKE 1 TABLET(150 MCG) BY MOUTH DAILY BEFORE BREAKFAST. 90 tablet 1  . losartan (COZAAR) 50 MG tablet Take 1 tablet (50 mg total) by mouth daily. 90 tablet 3  . metFORMIN (GLUCOPHAGE-XR) 500 MG 24 hr tablet Take 1 tablet (500 mg total) by mouth daily  with breakfast. 90 tablet 0  . metoprolol succinate (TOPROL-XL) 50 MG 24 hr tablet TAKE 1 TABLET(50 MG) BY MOUTH DAILY. 90 tablet 0  . modafinil (PROVIGIL) 100 MG tablet Take 300mg  daily 90 tablet 0  . modafinil (PROVIGIL) 200 MG tablet Take 1 tablet daily around 11am. 30 tablet 0  . potassium chloride SA (K-DUR,KLOR-CON) 20 MEQ tablet TAKE 2 TABLETS(40 MEQ) BY MOUTH DAILY 60 tablet 11  . rosuvastatin (CRESTOR) 10 MG tablet TAKE 1 TABLET(10 MG) BY MOUTH DAILY 30 tablet 0  . traMADol (ULTRAM) 50 MG tablet Take 1-2 tablets (50-100 mg total) by mouth every 8 (eight) hours as needed. 20 tablet 0  . Vitamin D, Ergocalciferol, (DRISDOL) 50000 units CAPS capsule Take 1 capsule (50,000 Units total) by mouth every 7 (seven) days. 4 capsule 0   No current facility-administered medications on file prior to visit.     PAST MEDICAL HISTORY: Past Medical History:  Diagnosis Date  . Acute renal failure (ARF) ([place]) 11/2012    multifactorial-likely secondary to ATN in the setting of sepsis and hypotension, and also from rhabdomyolysis  . Anemia   . Cellulitis   . Chronic diastolic CHF (congestive heart failure) ([place])   . Depression   . Diabetes mellitus ([place])   . GERD (gastroesophageal reflux disease)   . Hyperlipidemia   . Hypertension   .  Hypothyroidism   . Morbid obesity with BMI of 70 and over, adult ([place])   . Obesity hypoventilation syndrome ([place])   . OSA (obstructive sleep apnea)   . PVC's (premature ventricular contractions)   . Recurrent cellulitis of lower leg    LLE, venous insuff  . Sepsis ([place]) 11/2012   Secondary to cellulitis  . Sleep apnea   . Venous insufficiency of leg     PAST SURGICAL HISTORY: Past Surgical History:  Procedure Laterality Date  . IR FLUORO GUIDE CV MIDLINE PICC RIGHT  03/28/2017  . IR US GUIDE VASC ACCESS RIGHT  03/28/2017  . WISDOM TOOTH EXTRACTION      SOCIAL HISTORY: Social History   Tobacco Use  . Smoking status: Former Smoker    Years: 0.00  .  Smokeless tobacco: Never Used  Substance Use Topics  . Alcohol use: No    Alcohol/week: 0.0 standard drinks  . Drug use: No    FAMILY HISTORY: Family History  Problem Relation Age of Onset  . Hypertension Mother   . Diabetes Mother   . High Cholesterol Mother   . Thyroid disease Mother   . Sleep apnea Mother   . Obesity Mother   . Hypertension Father   . Heart disease Father        before age 52  . High Cholesterol Father   . Sleep apnea Father   . Obesity Father     ROS: Review of Systems  Constitutional: Positive for malaise/fatigue and weight loss.  Respiratory: Negative for shortness of breath.   Gastrointestinal: Negative for nausea and vomiting.  Musculoskeletal:       Negative muscle weakness    PHYSICAL EXAM: Blood pressure 125/66, pulse 62, temperature 98 F (36.7 C), temperature source Oral, height 5\' 2"  (1.575 m), weight (!) 404 lb (183.3 kg), SpO2 97 %. Body mass index is 73.89 kg/m. Physical Exam  Constitutional: She is oriented to person, place, and time. She appears well-developed and well-nourished.  Cardiovascular: Normal rate.  Pulmonary/Chest: Effort normal.  Musculoskeletal: Normal range of motion. She exhibits no edema.  Neurological: She is oriented to person, place, and time.  Skin: Skin is warm and dry.  Psychiatric: She has a normal mood and affect. Her behavior is normal.  Vitals reviewed.   RECENT LABS AND TESTS: BMET    Component Value Date/Time   NA 141 07/25/2018 1234   K 4.5 07/25/2018 1234   CL 100 07/25/2018 1234   CO2 25 07/25/2018 1234   GLUCOSE 96 07/25/2018 1234   GLUCOSE 117 (H) 03/23/2018 1224   BUN 21 (H) 07/25/2018 1234   CREATININE 0.84 07/25/2018 1234   CREATININE 1.06 11/14/2016 1050   CALCIUM 9.6 07/25/2018 1234   CALCIUM 9.6 06/26/2007 0000   GFRNONAA 88 07/25/2018 1234   GFRAA 102 07/25/2018 1234   Lab Results  Component Value Date   HGBA1C 5.8 (H) 07/25/2018   HGBA1C 6.5 03/23/2018   HGBA1C 6.6 (H)  03/28/2017   HGBA1C 6.9 (H) 03/02/2016   HGBA1C 6.4 07/15/2013   Lab Results  Component Value Date   INSULIN 24.7 07/25/2018   INSULIN 25.0 (H) 04/17/2018   CBC    Component Value Date/Time   WBC 5.9 07/25/2018 1234   WBC 7.9 03/23/2018 1224   RBC 5.07 07/25/2018 1234   RBC 5.65 (H) 03/23/2018 1224   HGB 14.8 07/25/2018 1234   HCT 44.8 07/25/2018 1234   PLT 230.0 03/23/2018 1224   MCV 88 07/25/2018 1234  MCH 29.2 07/25/2018 1234   MCH 26.8 04/19/2017 0419   MCHC 33.0 07/25/2018 1234   MCHC 31.6 03/23/2018 1224   RDW 15.6 (H) 07/25/2018 1234   LYMPHSABS 1.4 07/25/2018 1234   MONOABS 0.7 04/16/2017 2351   EOSABS 0.2 07/25/2018 1234   BASOSABS 0.0 07/25/2018 1234   Iron/TIBC/Ferritin/ %Sat No results found for: IRON, TIBC, FERRITIN, IRONPCTSAT Lipid Panel     Component Value Date/Time   CHOL 147 07/25/2018 1234   TRIG 141 07/25/2018 1234   HDL 42 07/25/2018 1234   CHOLHDL 4 03/23/2018 1224   VLDL 29.6 03/23/2018 1224   LDLCALC 77 07/25/2018 1234   LDLDIRECT 141.0 03/02/2016 1101   Hepatic Function Panel     Component Value Date/Time   PROT 6.9 07/25/2018 1234   ALBUMIN 4.0 07/25/2018 1234   AST 18 07/25/2018 1234   ALT 21 07/25/2018 1234   ALKPHOS 73 07/25/2018 1234   BILITOT 0.5 07/25/2018 1234   BILIDIR 0.1 08/30/2016 1413   IBILI 0.5 12/25/2012 0223      Component Value Date/Time   TSH 1.600 04/17/2018 1239   TSH 3.96 03/23/2018 1224   TSH 1.310 12/16/2016 1438  Results for ADLINE, KIRSHENBAUM (MRN [id number]) as of 08/29/2018 10:06  Ref. Range 07/25/2018 12:34  Vitamin D, 25-Hydroxy Latest Ref Range: 30.0 - 100.0 ng/mL 35.0    ASSESSMENT AND PLAN: Vitamin D deficiency  Chronic diastolic CHF (congestive heart failure) ([facility])  Class 3 severe obesity with serious comorbidity and body mass index (BMI) greater than or equal to 70 in adult, unspecified obesity type ([place])  PLAN:  Vitamin D Deficiency Mikaela was informed that low vitamin D levels contributes  to fatigue and are associated with obesity, breast, and colon cancer. Adelin agrees to continue taking prescription Vit D @50 ,000 IU every week, no refill needed. She will follow up for routine testing of vitamin D, at least 2-3 times per year. She was informed of the risk of over-replacement of vitamin D and agrees to not increase her dose unless she discusses this with [place] first. Paulene agrees to follow up with our clinic in 2 weeks.  Congestive Heart Failure Reighan will continue her current medications and she agrees to follow up with our clinic in 2 weeks.  I spent > than 50% of the 15 minute visit on counseling as documented in the note.  Obesity Ilani is currently in the action stage of change. As such, her goal is to continue with weight loss efforts She has agreed to keep a food journal with 400-500 calories and 35+ grams of protein at lunch daily and follow the Category 4 plan Yanique has been instructed to work up to a goal of 150 minutes of combined cardio and strengthening exercise per week for weight loss and overall health benefits. We discussed the following Behavioral Modification Strategies today: increasing lean protein intake, increasing vegetables, work on meal planning and easy cooking plans, and planning for success   Latacha has agreed to follow up with our clinic in 2 weeks. She was informed of the importance of frequent follow up visits to maximize her success with intensive lifestyle modifications for her multiple health conditions.   OBESITY BEHAVIORAL INTERVENTION VISIT  Today's visit was # 9   Starting weight: 468 lbs Starting date: 04/17/18 Today's weight : 404 lbs  Today's date: 08/28/2018 Total lbs lost to date: 49    ASK: We discussed the diagnosis of obesity with Duwayne Heck today and Graylon Gunning agreed  to give [place] permission to discuss obesity behavioral modification therapy today.  ASSESS: Genell has the diagnosis of obesity and her BMI today is 73.87 Lynnda is  in the action stage of change   ADVISE: Merelin was educated on the multiple health risks of obesity as well as the benefit of weight loss to improve her health. She was advised of the need for long term treatment and the importance of lifestyle modifications to improve her current health and to decrease her risk of future health problems.  AGREE: Multiple dietary modification options and treatment options were discussed and  Ashwini agreed to follow the recommendations documented in the above note.  ARRANGE: Rasheka was educated on the importance of frequent visits to treat obesity as outlined per CMS and USPSTF guidelines and agreed to schedule her next follow up appointment today.  I, Trixie Dredge, am acting as transcriptionist for [name] Qua, MD  I have reviewed the above documentation for accuracy and completeness, and I agree with the above. - [name] Qua, MD

## 2018-09-01 ENCOUNTER — Other Ambulatory Visit: Payer: Self-pay | Admitting: Nurse Practitioner

## 2018-09-01 DIAGNOSIS — E669 Obesity, unspecified: Principal | ICD-10-CM

## 2018-09-01 DIAGNOSIS — E1169 Type 2 diabetes mellitus with other specified complication: Secondary | ICD-10-CM

## 2018-09-01 LAB — HM DIABETES EYE EXAM

## 2018-09-02 ENCOUNTER — Other Ambulatory Visit (INDEPENDENT_AMBULATORY_CARE_PROVIDER_SITE_OTHER): Payer: Self-pay | Admitting: Family Medicine

## 2018-09-02 DIAGNOSIS — E559 Vitamin D deficiency, unspecified: Secondary | ICD-10-CM

## 2018-09-04 ENCOUNTER — Encounter: Payer: Self-pay | Admitting: Nurse Practitioner

## 2018-09-04 ENCOUNTER — Other Ambulatory Visit: Payer: Self-pay | Admitting: Nurse Practitioner

## 2018-09-05 ENCOUNTER — Ambulatory Visit: Payer: Self-pay | Admitting: Internal Medicine

## 2018-09-11 ENCOUNTER — Ambulatory Visit (INDEPENDENT_AMBULATORY_CARE_PROVIDER_SITE_OTHER): Payer: BC Managed Care – PPO | Admitting: Family Medicine

## 2018-09-11 ENCOUNTER — Other Ambulatory Visit: Payer: Self-pay | Admitting: Pulmonary Disease

## 2018-09-11 ENCOUNTER — Encounter (INDEPENDENT_AMBULATORY_CARE_PROVIDER_SITE_OTHER): Payer: Self-pay | Admitting: Family Medicine

## 2018-09-11 VITALS — BP 127/65 | HR 75 | Temp 98.8°F | Ht 62.0 in | Wt >= 6400 oz

## 2018-09-11 DIAGNOSIS — G4733 Obstructive sleep apnea (adult) (pediatric): Secondary | ICD-10-CM

## 2018-09-11 DIAGNOSIS — Z9189 Other specified personal risk factors, not elsewhere classified: Secondary | ICD-10-CM | POA: Diagnosis not present

## 2018-09-11 DIAGNOSIS — E559 Vitamin D deficiency, unspecified: Secondary | ICD-10-CM | POA: Diagnosis not present

## 2018-09-11 DIAGNOSIS — Z6841 Body Mass Index (BMI) 40.0 and over, adult: Secondary | ICD-10-CM

## 2018-09-11 MED ORDER — VITAMIN D (ERGOCALCIFEROL) 1.25 MG (50000 UNIT) PO CAPS: 50000.0000 [IU] | ORAL_CAPSULE | ORAL | 0 refills | Status: DC

## 2018-09-12 NOTE — Progress Notes (Signed)
Office: [phone]  /  Fax: [phone]   HPI:   Chief Complaint: OBESITY Alexandra Beck is here to discuss her progress with her obesity treatment plan. She is on the Category 4 plan and is following her eating plan approximately 92 % of the time. She states she is exercising 0 minutes 0 times per week. Alexandra Beck hasn't started physical activity yet. She has plans to go to [facility] in three weeks and she wants journaling at the next appointment. Her weight is (!) 400 lb (181.4 kg) today and has had a weight loss of 4 pounds over a period of 2 weeks since her last visit. She has lost 68 lbs since starting treatment with [place].  Vitamin D deficiency Alexandra Beck has a diagnosis of vitamin D deficiency. Alexandra Beck is currently taking vit D and she admits to fatigue, but she denies nausea, vomiting or muscle weakness.  At risk for osteopenia and osteoporosis Alexandra Beck is at higher risk of osteopenia and osteoporosis due to vitamin D deficiency.   OSA (obstructive sleep apnea) Alexandra Beck has a history of obstructive sleep apnea and she sees Dr. Elsworth Soho on November 16th.  ALLERGIES: Allergies  Allergen Reactions  . Aspirin Hives and Swelling    REACTION: throat swelling, hives  . Doxycycline Other (See Comments)    Abdominal pain  . Lisinopril Cough  . Niaspan [Niacin Er]     Caused flushing    MEDICATIONS: Current Outpatient Medications on File Prior to Visit  Medication Sig Dispense Refill  . amoxicillin (AMOXIL) 500 MG capsule Take 500 mg by mouth 2 (two) times daily.    . cephALEXin (KEFLEX) 500 MG capsule TAKE 1 CAPSULE(500 MG) BY MOUTH FOUR TIMES DAILY 20 capsule 1  . furosemide (LASIX) 80 MG tablet Take 1 tablet (80 mg total) by mouth daily. 90 tablet 3  . levothyroxine (SYNTHROID, LEVOTHROID) 150 MCG tablet TAKE 1 TABLET(150 MCG) BY MOUTH DAILY BEFORE BREAKFAST. 90 tablet 1  . losartan (COZAAR) 50 MG tablet Take 1 tablet (50 mg total) by mouth daily. 90 tablet 3  . metFORMIN (GLUCOPHAGE-XR) 500 MG 24 hr tablet  TAKE 1 TABLET(500 MG) BY MOUTH DAILY WITH BREAKFAST 90 tablet 0  . metoprolol succinate (TOPROL-XL) 50 MG 24 hr tablet TAKE 1 TABLET(50 MG) BY MOUTH DAILY 90 tablet 0  . modafinil (PROVIGIL) 200 MG tablet Take 1 tablet daily around 11am. 30 tablet 0  . potassium chloride SA (K-DUR,KLOR-CON) 20 MEQ tablet TAKE 2 TABLETS(40 MEQ) BY MOUTH DAILY 60 tablet 11  . rosuvastatin (CRESTOR) 10 MG tablet TAKE 1 TABLET(10 MG) BY MOUTH DAILY 30 tablet 0  . traMADol (ULTRAM) 50 MG tablet Take 1-2 tablets (50-100 mg total) by mouth every 8 (eight) hours as needed. 20 tablet 0   No current facility-administered medications on file prior to visit.     PAST MEDICAL HISTORY: Past Medical History:  Diagnosis Date  . Acute renal failure (ARF) ([place]) 11/2012    multifactorial-likely secondary to ATN in the setting of sepsis and hypotension, and also from rhabdomyolysis  . Anemia   . Cellulitis   . Chronic diastolic CHF (congestive heart failure) ([place])   . Depression   . Diabetes mellitus ([place])   . GERD (gastroesophageal reflux disease)   . Hyperlipidemia   . Hypertension   . Hypothyroidism   . Morbid obesity with BMI of 70 and over, adult ([place])   . Obesity hypoventilation syndrome ([place])   . OSA (obstructive sleep apnea)   . PVC's (premature ventricular contractions)   .  Recurrent cellulitis of lower leg    LLE, venous insuff  . Sepsis ([place]) 11/2012   Secondary to cellulitis  . Sleep apnea   . Venous insufficiency of leg     PAST SURGICAL HISTORY: Past Surgical History:  Procedure Laterality Date  . IR FLUORO GUIDE CV MIDLINE PICC RIGHT  03/28/2017  . IR US GUIDE VASC ACCESS RIGHT  03/28/2017  . WISDOM TOOTH EXTRACTION      SOCIAL HISTORY: Social History   Tobacco Use  . Smoking status: Former Smoker    Years: 0.00  . Smokeless tobacco: Never Used  Substance Use Topics  . Alcohol use: No    Alcohol/week: 0.0 standard drinks  . Drug use: No    FAMILY HISTORY: Family History  Problem  Relation Age of Onset  . Hypertension Mother   . Diabetes Mother   . High Cholesterol Mother   . Thyroid disease Mother   . Sleep apnea Mother   . Obesity Mother   . Hypertension Father   . Heart disease Father        before age 57  . High Cholesterol Father   . Sleep apnea Father   . Obesity Father     ROS: Review of Systems  Constitutional: Positive for malaise/fatigue and weight loss.  Gastrointestinal: Negative for nausea and vomiting.  Musculoskeletal:       Negative for muscle weakness    PHYSICAL EXAM: Blood pressure 127/65, pulse 75, temperature 98.8 F (37.1 C), temperature source Oral, height 5\' 2"  (1.575 m), weight (!) 400 lb (181.4 kg), SpO2 92 %. Body mass index is 73.16 kg/m. Physical Exam  Constitutional: She is oriented to person, place, and time. She appears well-developed and well-nourished.  Cardiovascular: Normal rate.  Pulmonary/Chest: Effort normal.  Musculoskeletal: Normal range of motion.  Neurological: She is oriented to person, place, and time.  Skin: Skin is warm and dry.  Psychiatric: She has a normal mood and affect. Her behavior is normal.  Vitals reviewed.   RECENT LABS AND TESTS: BMET    Component Value Date/Time   NA 141 07/25/2018 1234   K 4.5 07/25/2018 1234   CL 100 07/25/2018 1234   CO2 25 07/25/2018 1234   GLUCOSE 96 07/25/2018 1234   GLUCOSE 117 (H) 03/23/2018 1224   BUN 21 (H) 07/25/2018 1234   CREATININE 0.84 07/25/2018 1234   CREATININE 1.06 11/14/2016 1050   CALCIUM 9.6 07/25/2018 1234   CALCIUM 9.6 06/26/2007 0000   GFRNONAA 88 07/25/2018 1234   GFRAA 102 07/25/2018 1234   Lab Results  Component Value Date   HGBA1C 5.8 (H) 07/25/2018   HGBA1C 6.5 03/23/2018   HGBA1C 6.6 (H) 03/28/2017   HGBA1C 6.9 (H) 03/02/2016   HGBA1C 6.4 07/15/2013   Lab Results  Component Value Date   INSULIN 24.7 07/25/2018   INSULIN 25.0 (H) 04/17/2018   CBC    Component Value Date/Time   WBC 5.9 07/25/2018 1234   WBC 7.9  03/23/2018 1224   RBC 5.07 07/25/2018 1234   RBC 5.65 (H) 03/23/2018 1224   HGB 14.8 07/25/2018 1234   HCT 44.8 07/25/2018 1234   PLT 230.0 03/23/2018 1224   MCV 88 07/25/2018 1234   MCH 29.2 07/25/2018 1234   MCH 26.8 04/19/2017 0419   MCHC 33.0 07/25/2018 1234   MCHC 31.6 03/23/2018 1224   RDW 15.6 (H) 07/25/2018 1234   LYMPHSABS 1.4 07/25/2018 1234   MONOABS 0.7 04/16/2017 2351   EOSABS 0.2 07/25/2018 1234  BASOSABS 0.0 07/25/2018 1234   Iron/TIBC/Ferritin/ %Sat No results found for: IRON, TIBC, FERRITIN, IRONPCTSAT Lipid Panel     Component Value Date/Time   CHOL 147 07/25/2018 1234   TRIG 141 07/25/2018 1234   HDL 42 07/25/2018 1234   CHOLHDL 4 03/23/2018 1224   VLDL 29.6 03/23/2018 1224   LDLCALC 77 07/25/2018 1234   LDLDIRECT 141.0 03/02/2016 1101   Hepatic Function Panel     Component Value Date/Time   PROT 6.9 07/25/2018 1234   ALBUMIN 4.0 07/25/2018 1234   AST 18 07/25/2018 1234   ALT 21 07/25/2018 1234   ALKPHOS 73 07/25/2018 1234   BILITOT 0.5 07/25/2018 1234   BILIDIR 0.1 08/30/2016 1413   IBILI 0.5 12/25/2012 0223      Component Value Date/Time   TSH 1.600 04/17/2018 1239   TSH 3.96 03/23/2018 1224   TSH 1.310 12/16/2016 1438   Results for QUAMESHA, MULLET (MRN [id number]) as of 09/12/2018 17:07  Ref. Range 07/25/2018 12:34  Vitamin D, 25-Hydroxy Latest Ref Range: 30.0 - 100.0 ng/mL 35.0   ASSESSMENT AND PLAN: Vitamin D deficiency - Plan: Vitamin D, Ergocalciferol, (DRISDOL) 50000 units CAPS capsule  OSA (obstructive sleep apnea)  At risk for osteoporosis  Class 3 severe obesity with serious comorbidity and body mass index (BMI) greater than or equal to 70 in adult, unspecified obesity type ([place])  PLAN:  Vitamin D Deficiency Alexandra Beck was informed that low vitamin D levels contributes to fatigue and are associated with obesity, breast, and colon cancer. She agrees to continue to take prescription Vit D @50 ,000 IU every week and will follow up for  routine testing of vitamin D, at least 2-3 times per year. She was informed of the risk of over-replacement of vitamin D and agrees to not increase her dose unless she discusses this with [place] first.  At risk for osteopenia and osteoporosis Alexandra Beck was given extended  (15 minutes) osteoporosis prevention counseling today. Alexandra Beck is at risk for osteopenia and osteoporosis due to her vitamin D deficiency. She was encouraged to take her vitamin D and follow her higher calcium diet and increase strengthening exercise to help strengthen her bones and decrease her risk of osteopenia and osteoporosis.  OSA (obstructive sleep apnea) Alexandra Beck will follow up at her previously scheduled appointment with Dr. Elsworth Soho on November 16th and she will follow up with our clinic in 2 weeks.  Obesity Alexandra Beck is currently in the action stage of change. As such, her goal is to continue with weight loss efforts She has agreed to follow the Category 4 plan Alexandra Beck has been instructed to work up to a goal of 150 minutes of combined cardio and strengthening exercise per week for weight loss and overall health benefits. We discussed the following Behavioral Modification Strategies today: better snacking choices, planning for success, increasing lean protein intake, increasing vegetables and work on meal planning and easy cooking plans  Alexandra Beck has agreed to follow up with our clinic in 2 weeks. She was informed of the importance of frequent follow up visits to maximize her success with intensive lifestyle modifications for her multiple health conditions.   OBESITY BEHAVIORAL INTERVENTION VISIT  Today's visit was # 10   Starting weight: 468 lbs Starting date: 04/17/18 Today's weight : 400 lbs  Today's date: 09/11/2018 Total lbs lost to date: 59   ASK: We discussed the diagnosis of obesity with Alexandra Beck today and Alexandra Beck agreed to give [place] permission to discuss obesity behavioral modification therapy today.  ASSESS: Alexandra Beck has  the diagnosis of obesity and her BMI today is 73.14 Alexandra Beck is in the action stage of change   ADVISE: Alexandra Beck was educated on the multiple health risks of obesity as well as the benefit of weight loss to improve her health. She was advised of the need for long term treatment and the importance of lifestyle modifications to improve her current health and to decrease her risk of future health problems.  AGREE: Multiple dietary modification options and treatment options were discussed and  Alexandra Beck agreed to follow the recommendations documented in the above note.  ARRANGE: Alexandra Beck was educated on the importance of frequent visits to treat obesity as outlined per CMS and USPSTF guidelines and agreed to schedule her next follow up appointment today.  I, Doreene Nest, am acting as transcriptionist for Eber Jones, MD  I have reviewed the above documentation for accuracy and completeness, and I agree with the above. - [name] Qua, MD

## 2018-09-27 ENCOUNTER — Ambulatory Visit (INDEPENDENT_AMBULATORY_CARE_PROVIDER_SITE_OTHER): Payer: BC Managed Care – PPO | Admitting: Family Medicine

## 2018-09-27 ENCOUNTER — Other Ambulatory Visit: Payer: Self-pay | Admitting: Nurse Practitioner

## 2018-09-27 ENCOUNTER — Encounter (INDEPENDENT_AMBULATORY_CARE_PROVIDER_SITE_OTHER): Payer: Self-pay | Admitting: Family Medicine

## 2018-09-27 VITALS — BP 139/76 | HR 57 | Temp 97.9°F | Ht 62.0 in | Wt >= 6400 oz

## 2018-09-27 DIAGNOSIS — Z9189 Other specified personal risk factors, not elsewhere classified: Secondary | ICD-10-CM

## 2018-09-27 DIAGNOSIS — E119 Type 2 diabetes mellitus without complications: Secondary | ICD-10-CM | POA: Diagnosis not present

## 2018-09-27 DIAGNOSIS — Z6841 Body Mass Index (BMI) 40.0 and over, adult: Secondary | ICD-10-CM

## 2018-09-27 DIAGNOSIS — E559 Vitamin D deficiency, unspecified: Secondary | ICD-10-CM

## 2018-09-27 MED ORDER — VITAMIN D (ERGOCALCIFEROL) 1.25 MG (50000 UNIT) PO CAPS: 50000.0000 [IU] | ORAL_CAPSULE | ORAL | 0 refills | Status: DC

## 2018-09-28 ENCOUNTER — Ambulatory Visit: Payer: Self-pay | Admitting: Cardiology

## 2018-10-01 ENCOUNTER — Encounter (INDEPENDENT_AMBULATORY_CARE_PROVIDER_SITE_OTHER): Payer: Self-pay | Admitting: Family Medicine

## 2018-10-01 NOTE — Progress Notes (Signed)
Office: [phone]  /  Fax: [phone]   HPI:   Chief Complaint: OBESITY Alexandra Beck is here to discuss her progress with her obesity treatment plan. She is on the Category 4 plan and is following her eating plan approximately 88 % of the time. She is also journaling for dinner at times. She states she is walking 10 minutes 2 times per week. Alexandra Beck is frustrated by her lack of weight loss.  She is going to [facility] for her honeymoon next week.   Her weight is (!) 400 lb (181.4 kg) today and has not lost weight since her last visit. She has lost 68 lbs since starting treatment with [place].  Diabetes II Alexandra Beck has a diagnosis of diabetes type II. Her last A1c was 5.8 on 07/25/18 and is at goal. Pilar does not check sugars at home. She currently takes metformin. She denies any hypoglycemic episodes or polyphagia. She also denies nausea, vomiting, and diarrhea. She has been working on intensive lifestyle modifications including diet, exercise, and weight loss to help control her blood glucose levels.  Vitamin D deficiency Alexandra Beck has a diagnosis of vitamin D deficiency. She is currently taking vit D and denies nausea, vomiting or muscle weakness.  At risk for osteopenia and osteoporosis Alexandra Beck is at higher risk of osteopenia and osteoporosis due to vitamin D deficiency.   ALLERGIES: Allergies  Allergen Reactions  . Aspirin Hives and Swelling    REACTION: throat swelling, hives  . Doxycycline Other (See Comments)    Abdominal pain  . Lisinopril Cough  . Niaspan [Niacin Er]     Caused flushing    MEDICATIONS: Current Outpatient Medications on File Prior to Visit  Medication Sig Dispense Refill  . amoxicillin (AMOXIL) 500 MG capsule Take 500 mg by mouth 2 (two) times daily.    . cephALEXin (KEFLEX) 500 MG capsule TAKE 1 CAPSULE(500 MG) BY MOUTH FOUR TIMES DAILY 20 capsule 1  . furosemide (LASIX) 80 MG tablet Take 1 tablet (80 mg total) by mouth daily. 90 tablet 3  . levothyroxine (SYNTHROID,  LEVOTHROID) 150 MCG tablet TAKE 1 TABLET(150 MCG) BY MOUTH DAILY BEFORE BREAKFAST. 90 tablet 1  . losartan (COZAAR) 50 MG tablet Take 1 tablet (50 mg total) by mouth daily. 90 tablet 3  . metFORMIN (GLUCOPHAGE-XR) 500 MG 24 hr tablet TAKE 1 TABLET(500 MG) BY MOUTH DAILY WITH BREAKFAST 90 tablet 0  . metoprolol succinate (TOPROL-XL) 50 MG 24 hr tablet TAKE 1 TABLET(50 MG) BY MOUTH DAILY 90 tablet 0  . modafinil (PROVIGIL) 100 MG tablet TAKE 1 TABLET BY MOUTH THREE TIMES DAILY 90 tablet 0  . modafinil (PROVIGIL) 200 MG tablet Take 1 tablet daily around 11am. 30 tablet 0  . potassium chloride SA (K-DUR,KLOR-CON) 20 MEQ tablet TAKE 2 TABLETS(40 MEQ) BY MOUTH DAILY 60 tablet 11  . traMADol (ULTRAM) 50 MG tablet Take 1-2 tablets (50-100 mg total) by mouth every 8 (eight) hours as needed. 20 tablet 0   No current facility-administered medications on file prior to visit.     PAST MEDICAL HISTORY: Past Medical History:  Diagnosis Date  . Acute renal failure (ARF) ([place]) 11/2012    multifactorial-likely secondary to ATN in the setting of sepsis and hypotension, and also from rhabdomyolysis  . Anemia   . Cellulitis   . Chronic diastolic CHF (congestive heart failure) ([place])   . Depression   . Diabetes mellitus ([place])   . GERD (gastroesophageal reflux disease)   . Hyperlipidemia   . Hypertension   .  Hypothyroidism   . Morbid obesity with BMI of 70 and over, adult ([place])   . Obesity hypoventilation syndrome ([place])   . OSA (obstructive sleep apnea)   . PVC's (premature ventricular contractions)   . Recurrent cellulitis of lower leg    LLE, venous insuff  . Sepsis ([place]) 11/2012   Secondary to cellulitis  . Sleep apnea   . Venous insufficiency of leg     PAST SURGICAL HISTORY: Past Surgical History:  Procedure Laterality Date  . IR FLUORO GUIDE CV MIDLINE PICC RIGHT  03/28/2017  . IR US GUIDE VASC ACCESS RIGHT  03/28/2017  . WISDOM TOOTH EXTRACTION      SOCIAL HISTORY: Social History   Tobacco  Use  . Smoking status: Former Smoker    Years: 0.00  . Smokeless tobacco: Never Used  Substance Use Topics  . Alcohol use: No    Alcohol/week: 0.0 standard drinks  . Drug use: No    FAMILY HISTORY: Family History  Problem Relation Age of Onset  . Hypertension Mother   . Diabetes Mother   . High Cholesterol Mother   . Thyroid disease Mother   . Sleep apnea Mother   . Obesity Mother   . Hypertension Father   . Heart disease Father        before age 66  . High Cholesterol Father   . Sleep apnea Father   . Obesity Father     ROS: Review of Systems  Constitutional: Negative for weight loss.  Gastrointestinal: Negative for diarrhea, nausea and vomiting.  Musculoskeletal:       Negative for muscle weakness.  Endo/Heme/Allergies:       Negative for polyphagia. Negative for hypoglycemia.    PHYSICAL EXAM: Blood pressure 139/76, pulse (!) 57, temperature 97.9 F (36.6 C), temperature source Oral, height 5\' 2"  (1.575 m), weight (!) 400 lb (181.4 kg), SpO2 96 %. Body mass index is 73.16 kg/m. Physical Exam  Constitutional: She is oriented to person, place, and time. She appears well-developed and well-nourished.  Cardiovascular: Normal rate.  Pulmonary/Chest: Effort normal.  Musculoskeletal: Normal range of motion.  Neurological: She is oriented to person, place, and time.  Skin: Skin is warm and dry.  Psychiatric:  Briefly tearful at beginning of visit.   Vitals reviewed.   RECENT LABS AND TESTS: BMET    Component Value Date/Time   NA 141 07/25/2018 1234   K 4.5 07/25/2018 1234   CL 100 07/25/2018 1234   CO2 25 07/25/2018 1234   GLUCOSE 96 07/25/2018 1234   GLUCOSE 117 (H) 03/23/2018 1224   BUN 21 (H) 07/25/2018 1234   CREATININE 0.84 07/25/2018 1234   CREATININE 1.06 11/14/2016 1050   CALCIUM 9.6 07/25/2018 1234   CALCIUM 9.6 06/26/2007 0000   GFRNONAA 88 07/25/2018 1234   GFRAA 102 07/25/2018 1234   Lab Results  Component Value Date   HGBA1C 5.8 (H)  07/25/2018   HGBA1C 6.5 03/23/2018   HGBA1C 6.6 (H) 03/28/2017   HGBA1C 6.9 (H) 03/02/2016   HGBA1C 6.4 07/15/2013   Lab Results  Component Value Date   INSULIN 24.7 07/25/2018   INSULIN 25.0 (H) 04/17/2018   CBC    Component Value Date/Time   WBC 5.9 07/25/2018 1234   WBC 7.9 03/23/2018 1224   RBC 5.07 07/25/2018 1234   RBC 5.65 (H) 03/23/2018 1224   HGB 14.8 07/25/2018 1234   HCT 44.8 07/25/2018 1234   PLT 230.0 03/23/2018 1224   MCV 88 07/25/2018 1234  MCH 29.2 07/25/2018 1234   MCH 26.8 04/19/2017 0419   MCHC 33.0 07/25/2018 1234   MCHC 31.6 03/23/2018 1224   RDW 15.6 (H) 07/25/2018 1234   LYMPHSABS 1.4 07/25/2018 1234   MONOABS 0.7 04/16/2017 2351   EOSABS 0.2 07/25/2018 1234   BASOSABS 0.0 07/25/2018 1234   Iron/TIBC/Ferritin/ %Sat No results found for: IRON, TIBC, FERRITIN, IRONPCTSAT Lipid Panel     Component Value Date/Time   CHOL 147 07/25/2018 1234   TRIG 141 07/25/2018 1234   HDL 42 07/25/2018 1234   CHOLHDL 4 03/23/2018 1224   VLDL 29.6 03/23/2018 1224   LDLCALC 77 07/25/2018 1234   LDLDIRECT 141.0 03/02/2016 1101   Hepatic Function Panel     Component Value Date/Time   PROT 6.9 07/25/2018 1234   ALBUMIN 4.0 07/25/2018 1234   AST 18 07/25/2018 1234   ALT 21 07/25/2018 1234   ALKPHOS 73 07/25/2018 1234   BILITOT 0.5 07/25/2018 1234   BILIDIR 0.1 08/30/2016 1413   IBILI 0.5 12/25/2012 0223      Component Value Date/Time   TSH 1.600 04/17/2018 1239   TSH 3.96 03/23/2018 1224   TSH 1.310 12/16/2016 1438   Results for SHELVIA, FOJTIK (MRN [id number]) as of 10/01/2018 09:23  Ref. Range 07/25/2018 12:34  Vitamin D, 25-Hydroxy Latest Ref Range: 30.0 - 100.0 ng/mL 35.0   ASSESSMENT AND PLAN: Type 2 diabetes mellitus without complication, without long-term current use of insulin (HCC)  Vitamin D deficiency - Plan: Vitamin D, Ergocalciferol, (DRISDOL) 50000 units CAPS capsule  At risk for osteoporosis  Class 3 severe obesity with serious  comorbidity and body mass index (BMI) greater than or equal to 70 in adult, unspecified obesity type ([place])  PLAN:  Diabetes II Davion has been given extensive diabetes education by myself today including ideal fasting and post-prandial blood glucose readings, individual ideal Hgb A1c goals, and hypoglycemia prevention. We discussed the importance of good blood sugar control to decrease the likelihood of diabetic complications such as nephropathy, neuropathy, limb loss, blindness, coronary artery disease, and death. We discussed the importance of intensive lifestyle modification including diet, exercise and weight loss as the first line treatment for diabetes. Portia agrees to continue her diet and metformin and she will follow up at the agreed upon time in 2 weeks.  Vitamin D Deficiency Marinna was informed that low vitamin D levels contributes to fatigue and are associated with obesity, breast, and colon cancer. She agrees to continue to take prescription Vit D @50 ,000 IU every week #4 with no refills and will follow up for routine testing of vitamin D, at least 2-3 times per year. She was informed of the risk of over-replacement of vitamin D and agrees to not increase her dose unless she discusses this with [place] first. Avanti agrees to follow up as agreed in 2 weeks.  At risk for osteopenia and osteoporosis Zoie was given extended (15 minutes) osteoporosis prevention counseling today. Elta is at risk for osteopenia and osteoporosis due to her vitamin D deficiency. She was encouraged to take her vitamin D and follow her higher calcium diet and increase strengthening exercise to help strengthen her bones and decrease her risk of osteopenia and osteoporosis.  Obesity Laneka is currently in the action stage of change. As such, her goal is to continue with weight loss efforts. She has agreed to follow the Category 4 plan. Francisco has been instructed to continue walking for 10 minutes 2 times per week and build  up slowly  in duration.  We discussed the following Behavioral Modification Strategies today: increasing lean protein intake, travel eating strategies, and planning for success. We discussed non scale victories and I provided encouragement.  Brandolyn has agreed to follow up with our clinic in 2 weeks. She was informed of the importance of frequent follow up visits to maximize her success with intensive lifestyle modifications for her multiple health conditions.   OBESITY BEHAVIORAL INTERVENTION VISIT  Today's visit was # 11   Starting weight: 468 lbs Starting date: 04/17/18 Today's weight : Weight: (!) 400 lb (181.4 kg)  Today's date: 09/27/2018 Total lbs lost to date: 52  ASK: We discussed the diagnosis of obesity with Duwayne Heck today and Aamina agreed to give [place] permission to discuss obesity behavioral modification therapy today.  ASSESS: Anitra has the diagnosis of obesity and her BMI today is 73.14. Yizel is in the action stage of change.   ADVISE: Almedia was educated on the multiple health risks of obesity as well as the benefit of weight loss to improve her health. She was advised of the need for long term treatment and the importance of lifestyle modifications to improve her current health and to decrease her risk of future health problems.  AGREE: Multiple dietary modification options and treatment options were discussed and Magdalyn agreed to follow the recommendations documented in the above note.  ARRANGE: Tifani was educated on the importance of frequent visits to treat obesity as outlined per CMS and USPSTF guidelines and agreed to schedule her next follow up appointment today.  Lenward Chancellor, am acting as Location manager for Georgianne Fick, FNP.  I have reviewed the above documentation for accuracy and completeness, and I agree with the above.  - Manjot Beumer, FNP-C.

## 2018-10-04 ENCOUNTER — Other Ambulatory Visit: Payer: Self-pay | Admitting: Physician Assistant

## 2018-10-04 DIAGNOSIS — I509 Heart failure, unspecified: Secondary | ICD-10-CM

## 2018-10-07 ENCOUNTER — Other Ambulatory Visit: Payer: Self-pay | Admitting: Pulmonary Disease

## 2018-10-08 ENCOUNTER — Other Ambulatory Visit: Payer: Self-pay | Admitting: Pulmonary Disease

## 2018-10-11 ENCOUNTER — Ambulatory Visit (INDEPENDENT_AMBULATORY_CARE_PROVIDER_SITE_OTHER): Payer: BC Managed Care – PPO | Admitting: Family Medicine

## 2018-10-11 VITALS — BP 131/77 | HR 63 | Temp 98.3°F | Ht 62.0 in | Wt 399.0 lb

## 2018-10-11 DIAGNOSIS — I509 Heart failure, unspecified: Secondary | ICD-10-CM | POA: Diagnosis not present

## 2018-10-11 DIAGNOSIS — Z6841 Body Mass Index (BMI) 40.0 and over, adult: Secondary | ICD-10-CM

## 2018-10-11 DIAGNOSIS — E559 Vitamin D deficiency, unspecified: Secondary | ICD-10-CM

## 2018-10-12 ENCOUNTER — Encounter: Payer: Self-pay | Admitting: Pulmonary Disease

## 2018-10-12 ENCOUNTER — Ambulatory Visit: Payer: BC Managed Care – PPO | Admitting: Pulmonary Disease

## 2018-10-12 DIAGNOSIS — G4733 Obstructive sleep apnea (adult) (pediatric): Secondary | ICD-10-CM | POA: Diagnosis not present

## 2018-10-12 DIAGNOSIS — G4721 Circadian rhythm sleep disorder, delayed sleep phase type: Secondary | ICD-10-CM | POA: Insufficient documentation

## 2018-10-12 DIAGNOSIS — G47419 Narcolepsy without cataplexy: Secondary | ICD-10-CM

## 2018-10-12 DIAGNOSIS — E662 Morbid (severe) obesity with alveolar hypoventilation: Secondary | ICD-10-CM

## 2018-10-12 NOTE — Assessment & Plan Note (Signed)
Check nocturnal oximetry on CPAP/room air to see if she still needs oxygen

## 2018-10-12 NOTE — Assessment & Plan Note (Signed)
CPAP 15 cm seems to be working well in controlling events  Weight loss encouraged, compliance with goal of at least 4-6 hrs every night is the expectation. Advised against medications with sedative side effects Cautioned against driving when sleepy - understanding that sleepiness will vary on a day to day basis

## 2018-10-12 NOTE — Assessment & Plan Note (Signed)
Again emphasized importance of sleep routine, scheduled bedtime and wake up times, light exposure and exercise.  Change Provigil dosing-take 200 mg at noon and another 100 mg at 5 PM.  Report back in 2 weeks how this new dosing regimen is working.

## 2018-10-12 NOTE — Assessment & Plan Note (Signed)
Light exposure in the mornings. Would be difficult to shift but even slightly earlier bedtime around 1-2 AM would be an improvement

## 2018-10-12 NOTE — Patient Instructions (Signed)
Check nocturnal oximetry on CPAP/room air.  Change Provigil dosing-take 200 mg at noon and another 100 mg at 5 PM.  Report back in 2 weeks how this new dosing regimen is working.  CPAP is working well

## 2018-10-12 NOTE — Progress Notes (Signed)
Subjective:    Patient ID: Alexandra Beck, female    DOB: [birth year], 37 y.o.   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  She is compliant with CPAP and has settled down with a full facemask.  Complains of mild dryness.  No problems with pressure CPAP download was reviewed which shows excellent compliance and residual AHI 5.5/hour with large leak and average pressure of 15 cm.  She felt much improved for about a week after starting nocturnal oxygen but this effect soon tapered off.  She went to [facility] for a week and did not miss the oxygen  She still has trouble waking up in the mornings, bedtime is as late as 3 AM and wake up time can be as late as noon.  We increased her Provigil from 200 to 300 mg .  She still feels sleepy around 5 PM and has difficulty getting through her workday If she has to go to work earlier around 9 AM, she finds that she is unable to wake up that early  There is no history suggestive of cataplexy, sleep paralysis or parasomnias  She has lost about 35 pounds since her initial visit       Significant tests/ events reviewed  NPSG 2009: AHI 93/hr On auto 5-20cm   ABG  06/2014 showing mild hypercarbia 7.35/50 1/90 5/97%   CPAP titration 05/2018 >> CPAP 15 + 3L O2 (434 lbs )  MSLT >> SOREMs x3    Past Medical History:  Diagnosis Date  . Acute renal failure (ARF) ([place]) 11/2012    multifactorial-likely secondary to ATN in the setting of sepsis and hypotension, and also from rhabdomyolysis  . Anemia   . Cellulitis   . Chronic diastolic CHF (congestive heart failure) ([place])   . Depression   . Diabetes mellitus ([place])   . GERD (gastroesophageal reflux disease)   .  Hyperlipidemia   . Hypertension   . Hypothyroidism   . Morbid obesity with BMI of 70 and over, adult ([place])   . Obesity hypoventilation syndrome ([place])   . OSA (obstructive sleep apnea)   . PVC's (premature ventricular contractions)   . Recurrent cellulitis of lower leg    LLE, venous insuff  . Sepsis ([place]) 11/2012   Secondary to cellulitis  . Sleep apnea   . Venous insufficiency of leg      Review of Systems neg for any significant sore throat, dysphagia, itching, sneezing, nasal congestion or excess/ purulent secretions, fever, chills, sweats, unintended wt loss, pleuritic or exertional cp, hempoptysis, orthopnea pnd or change in chronic leg swelling. Also denies presyncope, palpitations, heartburn, abdominal pain, nausea, vomiting, diarrhea or change in bowel or urinary habits, dysuria,hematuria, rash, arthralgias, visual complaints, headache, numbness weakness or ataxia.      Objective:   Physical Exam  Gen. Pleasant, obese, in no distress ENT - no lesions, no post nasal drip Neck: No JVD, no thyromegaly, no carotid bruits Lungs: no use of accessory muscles, no dullness to percussion, decreased without rales or rhonchi  Cardiovascular: Rhythm regular, heart sounds  normal, no murmurs or gallops, 1+ peripheral edema Musculoskeletal: No deformities, no cyanosis or clubbing , no tremors  Assessment & Plan:

## 2018-10-15 MED ORDER — MODAFINIL 100 MG PO TABS: 100.0000 mg | ORAL_TABLET | Freq: Three times a day (TID) | ORAL | 0 refills | Status: DC

## 2018-10-15 NOTE — Addendum Note (Signed)
Addended by: Valerie Salts on: 10/15/2018 12:18 PM   Modules accepted: Orders

## 2018-10-17 NOTE — Progress Notes (Signed)
Office: [phone]  /  Fax: [phone]   HPI:   Chief Complaint: OBESITY Alexandra Beck is here to discuss her progress with her obesity treatment plan. She is on the Category 4 plan and is following her eating plan approximately 50 % of the time. She states she is walking for 10 minutes 2 times per week. Alexandra Beck had a recent trip to [place] for her honeymoon. She did a lot of walking and ate out but made good choices when she was out. Her weight is (!) 399 lb (181 kg) today and has had a weight loss of 1 pound over a period of 2 weeks since her last visit. She has lost 73 lbs since starting treatment with [place].  Congestive Heart Failure (Combined systolic and diastolic) Alexandra Beck has CHF and she sees Dr. Meda Coffee in December. She denies dyspnea or lower extremity edema.  Vitamin D Deficiency Alexandra Beck has a diagnosis of vitamin D deficiency. She is currently taking prescription Vit D She notes fatigue and denies nausea, vomiting or muscle weakness.  ALLERGIES: Allergies  Allergen Reactions  . Aspirin Hives and Swelling    REACTION: throat swelling, hives  . Doxycycline Other (See Comments)    Abdominal pain  . Lisinopril Cough  . Niaspan [Niacin Er]     Caused flushing    MEDICATIONS: Current Outpatient Medications on File Prior to Visit  Medication Sig Dispense Refill  . amoxicillin (AMOXIL) 500 MG capsule Take 500 mg by mouth 2 (two) times daily.    . cephALEXin (KEFLEX) 500 MG capsule TAKE 1 CAPSULE(500 MG) BY MOUTH FOUR TIMES DAILY 20 capsule 1  . furosemide (LASIX) 80 MG tablet Take 1 tablet (80 mg total) by mouth daily. 90 tablet 3  . levothyroxine (SYNTHROID, LEVOTHROID) 150 MCG tablet TAKE 1 TABLET(150 MCG) BY MOUTH DAILY BEFORE BREAKFAST. 90 tablet 1  . losartan (COZAAR) 50 MG tablet Take 1 tablet (50 mg total) by mouth daily. 90 tablet 3  . metFORMIN (GLUCOPHAGE-XR) 500 MG 24 hr tablet TAKE 1 TABLET(500 MG) BY MOUTH DAILY WITH BREAKFAST 90 tablet 0  . metoprolol succinate (TOPROL-XL)  50 MG 24 hr tablet TAKE 1 TABLET(50 MG) BY MOUTH DAILY 90 tablet 0  . potassium chloride SA (K-DUR,KLOR-CON) 20 MEQ tablet TAKE 2 TABLETS BY MOUTH EVERY DAY 180 tablet 2  . rosuvastatin (CRESTOR) 10 MG tablet TAKE 1 TABLET(10 MG) BY MOUTH DAILY 30 tablet 0  . traMADol (ULTRAM) 50 MG tablet Take 1-2 tablets (50-100 mg total) by mouth every 8 (eight) hours as needed. 20 tablet 0  . Vitamin D, Ergocalciferol, (DRISDOL) 50000 units CAPS capsule Take 1 capsule (50,000 Units total) by mouth every 7 (seven) days. 4 capsule 0   No current facility-administered medications on file prior to visit.     PAST MEDICAL HISTORY: Past Medical History:  Diagnosis Date  . Acute renal failure (ARF) ([place]) 11/2012    multifactorial-likely secondary to ATN in the setting of sepsis and hypotension, and also from rhabdomyolysis  . Anemia   . Cellulitis   . Chronic diastolic CHF (congestive heart failure) ([place])   . Depression   . Diabetes mellitus ([place])   . GERD (gastroesophageal reflux disease)   . Hyperlipidemia   . Hypertension   . Hypothyroidism   . Morbid obesity with BMI of 70 and over, adult ([place])   . Obesity hypoventilation syndrome ([place])   . OSA (obstructive sleep apnea)   . PVC's (premature ventricular contractions)   . Recurrent cellulitis of lower leg  LLE, venous insuff  . Sepsis ([place]) 11/2012   Secondary to cellulitis  . Sleep apnea   . Venous insufficiency of leg     PAST SURGICAL HISTORY: Past Surgical History:  Procedure Laterality Date  . IR FLUORO GUIDE CV MIDLINE PICC RIGHT  03/28/2017  . IR US GUIDE VASC ACCESS RIGHT  03/28/2017  . WISDOM TOOTH EXTRACTION      SOCIAL HISTORY: Social History   Tobacco Use  . Smoking status: Former Smoker    Years: 0.00  . Smokeless tobacco: Never Used  Substance Use Topics  . Alcohol use: No    Alcohol/week: 0.0 standard drinks  . Drug use: No    FAMILY HISTORY: Family History  Problem Relation Age of Onset  . Hypertension Mother     . Diabetes Mother   . High Cholesterol Mother   . Thyroid disease Mother   . Sleep apnea Mother   . Obesity Mother   . Hypertension Father   . Heart disease Father        before age 43  . High Cholesterol Father   . Sleep apnea Father   . Obesity Father     ROS: Review of Systems  Constitutional: Positive for malaise/fatigue and weight loss.  Respiratory: Negative for shortness of breath.   Gastrointestinal: Negative for nausea and vomiting.  Musculoskeletal:       Negative muscle weakness    PHYSICAL EXAM: Blood pressure 131/77, pulse 63, temperature 98.3 F (36.8 C), temperature source Oral, height 5\' 2"  (1.575 m), weight (!) 399 lb (181 kg), SpO2 92 %. Body mass index is 72.98 kg/m. Physical Exam  Constitutional: She is oriented to person, place, and time. She appears well-developed and well-nourished.  Cardiovascular: Normal rate.  Pulmonary/Chest: Effort normal.  Musculoskeletal: Normal range of motion. She exhibits no edema.  Neurological: She is oriented to person, place, and time.  Skin: Skin is warm and dry.  Psychiatric: She has a normal mood and affect. Her behavior is normal.  Vitals reviewed.   RECENT LABS AND TESTS: BMET    Component Value Date/Time   NA 141 07/25/2018 1234   K 4.5 07/25/2018 1234   CL 100 07/25/2018 1234   CO2 25 07/25/2018 1234   GLUCOSE 96 07/25/2018 1234   GLUCOSE 117 (H) 03/23/2018 1224   BUN 21 (H) 07/25/2018 1234   CREATININE 0.84 07/25/2018 1234   CREATININE 1.06 11/14/2016 1050   CALCIUM 9.6 07/25/2018 1234   CALCIUM 9.6 06/26/2007 0000   GFRNONAA 88 07/25/2018 1234   GFRAA 102 07/25/2018 1234   Lab Results  Component Value Date   HGBA1C 5.8 (H) 07/25/2018   HGBA1C 6.5 03/23/2018   HGBA1C 6.6 (H) 03/28/2017   HGBA1C 6.9 (H) 03/02/2016   HGBA1C 6.4 07/15/2013   Lab Results  Component Value Date   INSULIN 24.7 07/25/2018   INSULIN 25.0 (H) 04/17/2018   CBC    Component Value Date/Time   WBC 5.9 07/25/2018  1234   WBC 7.9 03/23/2018 1224   RBC 5.07 07/25/2018 1234   RBC 5.65 (H) 03/23/2018 1224   HGB 14.8 07/25/2018 1234   HCT 44.8 07/25/2018 1234   PLT 230.0 03/23/2018 1224   MCV 88 07/25/2018 1234   MCH 29.2 07/25/2018 1234   MCH 26.8 04/19/2017 0419   MCHC 33.0 07/25/2018 1234   MCHC 31.6 03/23/2018 1224   RDW 15.6 (H) 07/25/2018 1234   LYMPHSABS 1.4 07/25/2018 1234   MONOABS 0.7 04/16/2017 2351   EOSABS  0.2 07/25/2018 1234   BASOSABS 0.0 07/25/2018 1234   Iron/TIBC/Ferritin/ %Sat No results found for: IRON, TIBC, FERRITIN, IRONPCTSAT Lipid Panel     Component Value Date/Time   CHOL 147 07/25/2018 1234   TRIG 141 07/25/2018 1234   HDL 42 07/25/2018 1234   CHOLHDL 4 03/23/2018 1224   VLDL 29.6 03/23/2018 1224   LDLCALC 77 07/25/2018 1234   LDLDIRECT 141.0 03/02/2016 1101   Hepatic Function Panel     Component Value Date/Time   PROT 6.9 07/25/2018 1234   ALBUMIN 4.0 07/25/2018 1234   AST 18 07/25/2018 1234   ALT 21 07/25/2018 1234   ALKPHOS 73 07/25/2018 1234   BILITOT 0.5 07/25/2018 1234   BILIDIR 0.1 08/30/2016 1413   IBILI 0.5 12/25/2012 0223      Component Value Date/Time   TSH 1.600 04/17/2018 1239   TSH 3.96 03/23/2018 1224   TSH 1.310 12/16/2016 1438  Results for CAYLAH, PLOUFF (MRN [id number]) as of 10/18/2018 08:05  Ref. Range 07/25/2018 12:34  Vitamin D, 25-Hydroxy Latest Ref Range: 30.0 - 100.0 ng/mL 35.0    ASSESSMENT AND PLAN: Other congestive heart failure (HCC)  Vitamin D deficiency  Class 3 severe obesity with serious comorbidity and body mass index (BMI) greater than or equal to 70 in adult, unspecified obesity type (HCC)  PLAN:  Congestive Heart Failure (Combined systolic and diastolic) Alexandra Beck will follow up with Dr. Meda Coffee in December and she agrees to follow up with our clinic in 2 weeks.  Vitamin D Deficiency Alexandra Beck was informed that low vitamin D levels contributes to fatigue and are associated with obesity, breast, and colon cancer.  Alexandra Beck agrees to continue taking prescription Vit D @50 ,000 IU every week and will follow up for routine testing of vitamin D, at least 2-3 times per year. She was informed of the risk of over-replacement of vitamin D and agrees to not increase her dose unless she discusses this with [place] first. Alexandra Beck agrees to follow up with our clinic in 2 weeks.  I spent > than 50% of the 15 minute visit on counseling as documented in the note.  Obesity Alexandra Beck is currently in the action stage of change. As such, her goal is to continue with weight loss efforts She has agreed to follow the Category 4 plan Alexandra Beck has been instructed to work up to a goal of 150 minutes of combined cardio and strengthening exercise per week for weight loss and overall health benefits. We discussed the following Behavioral Modification Strategies today: increasing lean protein intake, increasing vegetables, decrease eating out, work on meal planning and easy cooking plans, and planning for success   Alexandra Beck has agreed to follow up with our clinic in 2 weeks. She was informed of the importance of frequent follow up visits to maximize her success with intensive lifestyle modifications for her multiple health conditions.   OBESITY BEHAVIORAL INTERVENTION VISIT  Today's visit was # 12   Starting weight: 468 lbs Starting date: 04/17/18 Today's weight : 399 lbs Today's date: 10/11/2018 Total lbs lost to date: 64    ASK: We discussed the diagnosis of obesity with Alexandra Beck today and Alexandra Beck agreed to give [place] permission to discuss obesity behavioral modification therapy today.  ASSESS: Alexandra Beck has the diagnosis of obesity and her BMI today is 72.23 Alexandra Beck is in the action stage of change   ADVISE: Alexandra Beck was educated on the multiple health risks of obesity as well as the benefit of weight loss to improve  her health. She was advised of the need for long term treatment and the importance of lifestyle modifications to improve her current  health and to decrease her risk of future health problems.  AGREE: Multiple dietary modification options and treatment options were discussed and  Alexandra Beck agreed to follow the recommendations documented in the above note.  ARRANGE: Alexandra Beck was educated on the importance of frequent visits to treat obesity as outlined per CMS and USPSTF guidelines and agreed to schedule her next follow up appointment today.  I, Trixie Dredge, am acting as transcriptionist for [name] Qua, MD  I have reviewed the above documentation for accuracy and completeness, and I agree with the above. - [name] Qua, MD

## 2018-10-30 ENCOUNTER — Ambulatory Visit (INDEPENDENT_AMBULATORY_CARE_PROVIDER_SITE_OTHER): Payer: BC Managed Care – PPO | Admitting: Family Medicine

## 2018-10-30 VITALS — BP 131/59 | HR 70 | Temp 98.4°F | Ht 62.0 in | Wt 396.0 lb

## 2018-10-30 DIAGNOSIS — E559 Vitamin D deficiency, unspecified: Secondary | ICD-10-CM

## 2018-10-30 DIAGNOSIS — E038 Other specified hypothyroidism: Secondary | ICD-10-CM | POA: Diagnosis not present

## 2018-10-30 DIAGNOSIS — Z9189 Other specified personal risk factors, not elsewhere classified: Secondary | ICD-10-CM

## 2018-10-30 DIAGNOSIS — Z6841 Body Mass Index (BMI) 40.0 and over, adult: Secondary | ICD-10-CM

## 2018-10-30 MED ORDER — VITAMIN D (ERGOCALCIFEROL) 1.25 MG (50000 UNIT) PO CAPS: 50000.0000 [IU] | ORAL_CAPSULE | ORAL | 0 refills | Status: DC

## 2018-11-01 NOTE — Progress Notes (Signed)
Office: [phone]  /  Fax: [phone]   HPI:   Chief Complaint: OBESITY Alexandra Beck is here to discuss her progress with her obesity treatment plan. She is on the  follow the Category 4 plan and is following her eating plan approximately 85 % of the time. She states she is exercising 0 minutes 0 times per week. Alexandra Beck is starting to feel restricted on Category 4 plan. She is getting more lenient on her "free Friday's" where she goes out with her wife and journals a meal.  Her weight is (!) 396 lb (179.6 kg) today and has had a weight loss of 3 pounds over a period of 3 weeks since her last visit. She has lost 76 lbs since starting treatment with [place].  Vitamin D deficiency Alexandra Beck has a diagnosis of vitamin D deficiency. She is currently taking vit D and denies nausea, vomiting or muscle weakness. She reports fatigue.   Hypothyroid Alexandra Beck has a diagnosis of hypothyroidism. She is on levothyroxine. She denies hot or cold intolerance or palpitations, but does admit to ongoing fatigue.  At risk for osteopenia and osteoporosis Alexandra Beck is at higher risk of osteopenia and osteoporosis due to vitamin D deficiency.   ALLERGIES: Allergies  Allergen Reactions  . Aspirin Hives and Swelling    REACTION: throat swelling, hives  . Doxycycline Other (See Comments)    Abdominal pain  . Lisinopril Cough  . Niaspan [Niacin Er]     Caused flushing    MEDICATIONS: Current Outpatient Medications on File Prior to Visit  Medication Sig Dispense Refill  . amoxicillin (AMOXIL) 500 MG capsule Take 500 mg by mouth 2 (two) times daily.    . cephALEXin (KEFLEX) 500 MG capsule TAKE 1 CAPSULE(500 MG) BY MOUTH FOUR TIMES DAILY 20 capsule 1  . furosemide (LASIX) 80 MG tablet Take 1 tablet (80 mg total) by mouth daily. 90 tablet 3  . levothyroxine (SYNTHROID, LEVOTHROID) 150 MCG tablet TAKE 1 TABLET(150 MCG) BY MOUTH DAILY BEFORE BREAKFAST. 90 tablet 1  . losartan (COZAAR) 50 MG tablet Take 1 tablet (50 mg total) by  mouth daily. 90 tablet 3  . metFORMIN (GLUCOPHAGE-XR) 500 MG 24 hr tablet TAKE 1 TABLET(500 MG) BY MOUTH DAILY WITH BREAKFAST 90 tablet 0  . metoprolol succinate (TOPROL-XL) 50 MG 24 hr tablet TAKE 1 TABLET(50 MG) BY MOUTH DAILY 90 tablet 0  . modafinil (PROVIGIL) 100 MG tablet Take 1 tablet (100 mg total) by mouth 3 (three) times daily. 90 tablet 0  . potassium chloride SA (K-DUR,KLOR-CON) 20 MEQ tablet TAKE 2 TABLETS BY MOUTH EVERY DAY 180 tablet 2  . rosuvastatin (CRESTOR) 10 MG tablet TAKE 1 TABLET(10 MG) BY MOUTH DAILY 30 tablet 0  . traMADol (ULTRAM) 50 MG tablet Take 1-2 tablets (50-100 mg total) by mouth every 8 (eight) hours as needed. 20 tablet 0   No current facility-administered medications on file prior to visit.     PAST MEDICAL HISTORY: Past Medical History:  Diagnosis Date  . Acute renal failure (ARF) ([place]) 11/2012    multifactorial-likely secondary to ATN in the setting of sepsis and hypotension, and also from rhabdomyolysis  . Anemia   . Cellulitis   . Chronic diastolic CHF (congestive heart failure) ([place])   . Depression   . Diabetes mellitus ([place])   . GERD (gastroesophageal reflux disease)   . Hyperlipidemia   . Hypertension   . Hypothyroidism   . Morbid obesity with BMI of 70 and over, adult ([place])   .  Obesity hypoventilation syndrome ([place])   . OSA (obstructive sleep apnea)   . PVC's (premature ventricular contractions)   . Recurrent cellulitis of lower leg    LLE, venous insuff  . Sepsis ([place]) 11/2012   Secondary to cellulitis  . Sleep apnea   . Venous insufficiency of leg     PAST SURGICAL HISTORY: Past Surgical History:  Procedure Laterality Date  . IR FLUORO GUIDE CV MIDLINE PICC RIGHT  03/28/2017  . IR US GUIDE VASC ACCESS RIGHT  03/28/2017  . WISDOM TOOTH EXTRACTION      SOCIAL HISTORY: Social History   Tobacco Use  . Smoking status: Former Smoker    Years: 0.00  . Smokeless tobacco: Never Used  Substance Use Topics  . Alcohol use: No     Alcohol/week: 0.0 standard drinks  . Drug use: No    FAMILY HISTORY: Family History  Problem Relation Age of Onset  . Hypertension Mother   . Diabetes Mother   . High Cholesterol Mother   . Thyroid disease Mother   . Sleep apnea Mother   . Obesity Mother   . Hypertension Father   . Heart disease Father        before age 46  . High Cholesterol Father   . Sleep apnea Father   . Obesity Father     ROS: Review of Systems  Constitutional: Positive for malaise/fatigue and weight loss.  Cardiovascular: Negative for palpitations.  Gastrointestinal: Negative for nausea and vomiting.  Musculoskeletal:       Negative for muscle weakness  Neurological:       Negative for hot/cold intolerance    PHYSICAL EXAM: Blood pressure (!) 131/59, pulse 70, temperature 98.4 F (36.9 C), temperature source Oral, height 5\' 2"  (1.575 m), weight (!) 396 lb (179.6 kg), SpO2 94 %. Body mass index is 72.43 kg/m. Physical Exam  Constitutional: She is oriented to person, place, and time. She appears well-developed and well-nourished.  HENT:  Head: Normocephalic.  Eyes: Pupils are equal, round, and reactive to light.  Neck: Normal range of motion.  Cardiovascular: Normal rate.  Pulmonary/Chest: Effort normal.  Musculoskeletal: Normal range of motion.  Neurological: She is alert and oriented to person, place, and time.  Skin: Skin is warm and dry.  Psychiatric: She has a normal mood and affect. Her behavior is normal.  Vitals reviewed.   RECENT LABS AND TESTS: BMET    Component Value Date/Time   NA 141 07/25/2018 1234   K 4.5 07/25/2018 1234   CL 100 07/25/2018 1234   CO2 25 07/25/2018 1234   GLUCOSE 96 07/25/2018 1234   GLUCOSE 117 (H) 03/23/2018 1224   BUN 21 (H) 07/25/2018 1234   CREATININE 0.84 07/25/2018 1234   CREATININE 1.06 11/14/2016 1050   CALCIUM 9.6 07/25/2018 1234   CALCIUM 9.6 06/26/2007 0000   GFRNONAA 88 07/25/2018 1234   GFRAA 102 07/25/2018 1234   Lab Results    Component Value Date   HGBA1C 5.8 (H) 07/25/2018   HGBA1C 6.5 03/23/2018   HGBA1C 6.6 (H) 03/28/2017   HGBA1C 6.9 (H) 03/02/2016   HGBA1C 6.4 07/15/2013   Lab Results  Component Value Date   INSULIN 24.7 07/25/2018   INSULIN 25.0 (H) 04/17/2018   CBC    Component Value Date/Time   WBC 5.9 07/25/2018 1234   WBC 7.9 03/23/2018 1224   RBC 5.07 07/25/2018 1234   RBC 5.65 (H) 03/23/2018 1224   HGB 14.8 07/25/2018 1234   HCT 44.8 07/25/2018  1234   PLT 230.0 03/23/2018 1224   MCV 88 07/25/2018 1234   MCH 29.2 07/25/2018 1234   MCH 26.8 04/19/2017 0419   MCHC 33.0 07/25/2018 1234   MCHC 31.6 03/23/2018 1224   RDW 15.6 (H) 07/25/2018 1234   LYMPHSABS 1.4 07/25/2018 1234   MONOABS 0.7 04/16/2017 2351   EOSABS 0.2 07/25/2018 1234   BASOSABS 0.0 07/25/2018 1234   Iron/TIBC/Ferritin/ %Sat No results found for: IRON, TIBC, FERRITIN, IRONPCTSAT Lipid Panel     Component Value Date/Time   CHOL 147 07/25/2018 1234   TRIG 141 07/25/2018 1234   HDL 42 07/25/2018 1234   CHOLHDL 4 03/23/2018 1224   VLDL 29.6 03/23/2018 1224   LDLCALC 77 07/25/2018 1234   LDLDIRECT 141.0 03/02/2016 1101   Hepatic Function Panel     Component Value Date/Time   PROT 6.9 07/25/2018 1234   ALBUMIN 4.0 07/25/2018 1234   AST 18 07/25/2018 1234   ALT 21 07/25/2018 1234   ALKPHOS 73 07/25/2018 1234   BILITOT 0.5 07/25/2018 1234   BILIDIR 0.1 08/30/2016 1413   IBILI 0.5 12/25/2012 0223      Component Value Date/Time   TSH 1.600 04/17/2018 1239   TSH 3.96 03/23/2018 1224   TSH 1.310 12/16/2016 1438    Ref. Range 07/25/2018 12:34  Vitamin D, 25-Hydroxy Latest Ref Range: 30.0 - 100.0 ng/mL 35.0    ASSESSMENT AND PLAN: Vitamin D deficiency - Plan: Vitamin D, Ergocalciferol, (DRISDOL) 1.25 MG (50000 UT) CAPS capsule  Other specified hypothyroidism  At risk for osteoporosis  Class 3 severe obesity with serious comorbidity and body mass index (BMI) greater than or equal to 70 in adult,  unspecified obesity type (HCC)  PLAN: Vitamin D Deficiency Alexandra Beck was informed that low vitamin D levels contributes to fatigue and are associated with obesity, breast, and colon cancer. She agrees to continue to take prescription Vit D @50 ,000 IU every week #4 with no refills and will follow up for routine testing of vitamin D, at least 2-3 times per year. She was informed of the risk of over-replacement of vitamin D and agrees to not increase her dose unless she discusses this with [place] first. Agrees to follow up with our clinic as directed.   Hypothyroid Alexandra Beck was informed of the importance of good thyroid control to help with weight loss efforts. She was also informed that supertheraputic thyroid levels are dangerous and will not improve weight loss results. She agrees to continue Synthroid as prescribes. Agrees to follow up with our clinic as directed.   At risk for osteopenia and osteoporosis Alexandra Beck was given extended  (15 minutes) osteoporosis prevention counseling today. Alexandra Beck is at risk for osteopenia and osteoporosis due to her vitamin D deficiency. She was encouraged to take her vitamin D and follow her higher calcium diet and increase strengthening exercise to help strengthen her bones and decrease her risk of osteopenia and osteoporosis.  Obesity Alexandra Beck is currently in the action stage of change. As such, her goal is to continue with weight loss efforts She has agreed to follow the Category 3 plan +300 calories Alexandra Beck has been instructed to work up to a goal of 150 minutes of combined cardio and strengthening exercise per week for weight loss and overall health benefits. We discussed the following Behavioral Modification Strategies today: increasing lean protein intake, work on meal planning and easy cooking plans, dealing with family or coworker sabotage and holiday eating strategies .    Alexandra Beck has agreed to follow  up with our clinic in 2 weeks. She was informed of the importance of  frequent follow up visits to maximize her success with intensive lifestyle modifications for her multiple health conditions.   OBESITY BEHAVIORAL INTERVENTION VISIT  Today's visit was # 13   Starting weight: 468 lb Starting date: 04/17/18 Today's weight : Weight: (!) 396 lb (179.6 kg)  Today's date: 10/30/18 Total lbs lost to date: 76 lb At least 15 minutes were spent on discussing the following behavioral intervention visit.   ASK: We discussed the diagnosis of obesity with Alexandra Beck today and Emali agreed to give [place] permission to discuss obesity behavioral modification therapy today.  ASSESS: Alexandra Beck has the diagnosis of obesity and her BMI today is 72.41 Alexandra Beck is in the action stage of change   ADVISE: Alexandra Beck was educated on the multiple health risks of obesity as well as the benefit of weight loss to improve her health. She was advised of the need for long term treatment and the importance of lifestyle modifications to improve her current health and to decrease her risk of future health problems.  AGREE: Multiple dietary modification options and treatment options were discussed and  Alexandra Beck agreed to follow the recommendations documented in the above note.  ARRANGE: Branae was educated on the importance of frequent visits to treat obesity as outlined per CMS and USPSTF guidelines and agreed to schedule her next follow up appointment today.  I, Renee Ramus, am acting as transcriptionist for [name] Qua, MD   I have reviewed the above documentation for accuracy and completeness, and I agree with the above. - [name] Qua, MD

## 2018-11-02 ENCOUNTER — Other Ambulatory Visit: Payer: Self-pay | Admitting: Internal Medicine

## 2018-11-02 DIAGNOSIS — L03115 Cellulitis of right lower limb: Secondary | ICD-10-CM

## 2018-11-02 DIAGNOSIS — L03119 Cellulitis of unspecified part of limb: Secondary | ICD-10-CM

## 2018-11-02 DIAGNOSIS — L03116 Cellulitis of left lower limb: Secondary | ICD-10-CM

## 2018-11-03 ENCOUNTER — Other Ambulatory Visit: Payer: Self-pay | Admitting: Nurse Practitioner

## 2018-11-08 ENCOUNTER — Telehealth: Payer: Self-pay

## 2018-11-08 ENCOUNTER — Other Ambulatory Visit: Payer: Self-pay | Admitting: Internal Medicine

## 2018-11-08 DIAGNOSIS — L03119 Cellulitis of unspecified part of limb: Secondary | ICD-10-CM

## 2018-11-08 NOTE — Telephone Encounter (Signed)
Refill request for Amoxicillin 500 mg received from pharmacy today. Patient has not been seen by our office since 02/27/18. Will route message to Dr. Baxter Flattery if refill is appropriate.  [place]

## 2018-11-10 NOTE — Telephone Encounter (Signed)
Yes. Okay for refill

## 2018-11-15 ENCOUNTER — Ambulatory Visit (INDEPENDENT_AMBULATORY_CARE_PROVIDER_SITE_OTHER): Payer: BC Managed Care – PPO | Admitting: Family Medicine

## 2018-11-15 ENCOUNTER — Encounter (INDEPENDENT_AMBULATORY_CARE_PROVIDER_SITE_OTHER): Payer: Self-pay | Admitting: Family Medicine

## 2018-11-15 VITALS — BP 132/71 | HR 54 | Temp 98.3°F | Ht 62.0 in | Wt >= 6400 oz

## 2018-11-15 DIAGNOSIS — Z9189 Other specified personal risk factors, not elsewhere classified: Secondary | ICD-10-CM

## 2018-11-15 DIAGNOSIS — I5032 Chronic diastolic (congestive) heart failure: Secondary | ICD-10-CM

## 2018-11-15 DIAGNOSIS — Z6841 Body Mass Index (BMI) 40.0 and over, adult: Secondary | ICD-10-CM

## 2018-11-15 DIAGNOSIS — E1165 Type 2 diabetes mellitus with hyperglycemia: Secondary | ICD-10-CM

## 2018-11-15 DIAGNOSIS — E559 Vitamin D deficiency, unspecified: Secondary | ICD-10-CM | POA: Diagnosis not present

## 2018-11-15 DIAGNOSIS — Z794 Long term (current) use of insulin: Secondary | ICD-10-CM

## 2018-11-15 MED ORDER — VITAMIN D (ERGOCALCIFEROL) 1.25 MG (50000 UNIT) PO CAPS: 50000.0000 [IU] | ORAL_CAPSULE | ORAL | 0 refills | Status: DC

## 2018-11-15 NOTE — Progress Notes (Signed)
Office: [phone]  /  Fax: [phone]   HPI:   Chief Complaint: OBESITY Alexandra Beck Beck is here to discuss her progress with her obesity treatment plan. She is on the Category 3 plan + 300 calories and is following her eating plan approximately 87 % of the time. She states she is exercising 0 minutes 0 times per week. Alexandra Beck had a good Thanksgiving and went down to [employer]. She has upcoming 2 weeks off for Christmas and New Years. She doesn't feel she was doing Category 3 + 300 calories properly.  Her weight is (!) 401 lb (181.9 kg) today and has gained 5 pounds since her last visit. She has lost 67 lbs since starting treatment with [place].  Vitamin D Deficiency Alexandra Beck Beck has a diagnosis of vitamin D deficiency. She is currently taking prescription Vit D. She notes fatigue and denies nausea, vomiting or muscle weakness.  At risk for osteopenia and osteoporosis Alexandra Beck Beck is at higher risk of osteopenia and osteoporosis due to vitamin D deficiency.   Congestive Heart Failure with EF Alexandra Beck Beck has a diagnosis of congestive heart failure. She sees Cardiology and no changes in medications.  Diabetes II with Hyperglycemia Alexandra Beck Beck has a diagnosis of diabetes type II. Alexandra Beck Beck notes carbohydrate cravings and denies GI side effects of metformin. She denies hypoglycemia. Last A1c was 5.8. She has been working on intensive lifestyle modifications including diet, exercise, and weight loss to help control her blood glucose levels.  ASSESSMENT AND PLAN:  Vitamin D deficiency - Plan: VITAMIN D 25 Hydroxy (Vit-D Deficiency, Fractures), Vitamin D, Ergocalciferol, (DRISDOL) 1.25 MG (50000 UT) CAPS capsule  Chronic diastolic CHF (congestive heart failure) (HCC) - Plan: Lipid Panel With LDL/HDL Ratio  Type 2 diabetes mellitus with hyperglycemia, with long-term current use of insulin (HCC) - Plan: Comprehensive metabolic panel, Hemoglobin A1c, Insulin, random, Lipid Panel With LDL/HDL Ratio  At risk for osteoporosis  Class 3  severe obesity with serious comorbidity and body mass index (BMI) greater than or equal to 70 in adult, unspecified obesity type (HCC)  PLAN:  Vitamin D Deficiency Alexandra Beck Beck was informed that low vitamin D levels contributes to fatigue and are associated with obesity, breast, and colon cancer. La agrees to continue taking prescription Vit D @50 ,000 IU every week #4 and we will refill for 1 month. She will follow up for routine testing of vitamin D, at least 2-3 times per year. She was informed of the risk of over-replacement of vitamin D and agrees to not increase her dose unless she discusses this with [place] first. We will check labs today. Alexandra Beck agrees to follow up with our clinic in 2 weeks.  At risk for osteopenia and osteoporosis Alexandra Beck Beck was given extended (15 minutes) osteoporosis prevention counseling today. Alexandra Beck is at risk for osteopenia and osteoporsis due to her vitamin D deficiency. She was encouraged to take her vitamin D and follow her higher calcium diet and increase strengthening exercise to help strengthen her bones and decrease her risk of osteopenia and osteoporosis.  Congestive Heart Failure with EF Alexandra Beck Beck agrees to continue her current medications, and she is to follow up with Cardiology. We will check labs today and Alexandra Beck agrees to follow up with our clinic in 2 weeks.  Diabetes II with Hyperglycemia Alexandra Beck Beck has been given extensive diabetes education by myself today including ideal fasting and post-prandial blood glucose readings, individual ideal Hgb A1c goals and hypoglycemia prevention. We discussed the importance of good blood sugar control to decrease the likelihood of diabetic complications  such as nephropathy, neuropathy, limb loss, blindness, coronary artery disease, and death. We discussed the importance of intensive lifestyle modification including diet, exercise and weight loss as the first line treatment for diabetes. Alexandra Beck Beck agrees to continue her diabetes medications and we  will check labs today. Alexandra Beck Beck agrees to follow up with our clinic in 2 weeks.  Obesity Alexandra Beck Beck is currently in the action stage of change. As such, her goal is to continue with weight loss efforts She has agreed to follow the Category 4 plan Alexandra Beck Beck has been instructed to work up to a goal of 150 minutes of combined cardio and strengthening exercise per week for weight loss and overall health benefits. We discussed the following Behavioral Modification Strategies today: increasing lean protein intake, work on meal planning and easy cooking plans, holiday eating strategies, celebration eating strategies, and better snacking choices   Alexandra Beck Beck has agreed to follow up with our clinic in 2 weeks. She was informed of the importance of frequent follow up visits to maximize her success with intensive lifestyle modifications for her multiple health conditions.  ALLERGIES: Allergies  Allergen Reactions  . Aspirin Hives and Swelling    REACTION: throat swelling, hives  . Doxycycline Other (See Comments)    Abdominal pain  . Lisinopril Cough  . Niaspan [Niacin Er]     Caused flushing    MEDICATIONS: Current Outpatient Medications on File Prior to Visit  Medication Sig Dispense Refill  . amoxicillin (AMOXIL) 500 MG capsule TAKE 1 CAPSULE(500 MG) BY MOUTH TWICE DAILY 60 capsule 1  . cephALEXin (KEFLEX) 500 MG capsule TAKE 1 CAPSULE(500 MG) BY MOUTH FOUR TIMES DAILY 20 capsule 1  . furosemide (LASIX) 80 MG tablet Take 1 tablet (80 mg total) by mouth daily. 90 tablet 3  . levothyroxine (SYNTHROID, LEVOTHROID) 150 MCG tablet TAKE 1 TABLET(150 MCG) BY MOUTH DAILY BEFORE BREAKFAST. 90 tablet 1  . losartan (COZAAR) 50 MG tablet Take 1 tablet (50 mg total) by mouth daily. 90 tablet 3  . metFORMIN (GLUCOPHAGE-XR) 500 MG 24 hr tablet TAKE 1 TABLET(500 MG) BY MOUTH DAILY WITH BREAKFAST 90 tablet 0  . metoprolol succinate (TOPROL-XL) 50 MG 24 hr tablet TAKE 1 TABLET(50 MG) BY MOUTH DAILY 90 tablet 0  . modafinil  (PROVIGIL) 100 MG tablet Take 1 tablet (100 mg total) by mouth 3 (three) times daily. 90 tablet 0  . potassium chloride SA (K-DUR,KLOR-CON) 20 MEQ tablet TAKE 2 TABLETS BY MOUTH EVERY DAY 180 tablet 2  . rosuvastatin (CRESTOR) 10 MG tablet TAKE 1 TABLET(10 MG) BY MOUTH DAILY 30 tablet 0  . traMADol (ULTRAM) 50 MG tablet Take 1-2 tablets (50-100 mg total) by mouth every 8 (eight) hours as needed. 20 tablet 0   No current facility-administered medications on file prior to visit.     PAST MEDICAL HISTORY: Past Medical History:  Diagnosis Date  . Acute renal failure (ARF) ([place]) 11/2012    multifactorial-likely secondary to ATN in the setting of sepsis and hypotension, and also from rhabdomyolysis  . Anemia   . Cellulitis   . Chronic diastolic CHF (congestive heart failure) ([place])   . Depression   . Diabetes mellitus ([place])   . GERD (gastroesophageal reflux disease)   . Hyperlipidemia   . Hypertension   . Hypothyroidism   . Morbid obesity with BMI of 70 and over, adult ([place])   . Obesity hypoventilation syndrome ([place])   . OSA (obstructive sleep apnea)   . PVC's (premature ventricular contractions)   . Recurrent  cellulitis of lower leg    LLE, venous insuff  . Sepsis ([place]) 11/2012   Secondary to cellulitis  . Sleep apnea   . Venous insufficiency of leg     PAST SURGICAL HISTORY: Past Surgical History:  Procedure Laterality Date  . IR FLUORO GUIDE CV MIDLINE PICC RIGHT  03/28/2017  . IR US GUIDE VASC ACCESS RIGHT  03/28/2017  . WISDOM TOOTH EXTRACTION      SOCIAL HISTORY: Social History   Tobacco Use  . Smoking status: Former Smoker    Years: 0.00  . Smokeless tobacco: Never Used  Substance Use Topics  . Alcohol use: No    Alcohol/week: 0.0 standard drinks  . Drug use: No    FAMILY HISTORY: Family History  Problem Relation Age of Onset  . Hypertension Mother   . Diabetes Mother   . High Cholesterol Mother   . Thyroid disease Mother   . Sleep apnea Mother   . Obesity  Mother   . Hypertension Father   . Heart disease Father        before age 67  . High Cholesterol Father   . Sleep apnea Father   . Obesity Father     ROS: Review of Systems  Constitutional: Positive for malaise/fatigue. Negative for weight loss.  Gastrointestinal: Negative for nausea and vomiting.  Musculoskeletal:       Negative muscle weakness  Endo/Heme/Allergies:       Negative hypoglycemia    PHYSICAL EXAM: Blood pressure 132/71, pulse (!) 54, temperature 98.3 F (36.8 C), temperature source Oral, height 5\' 2"  (1.575 m), weight (!) 401 lb (181.9 kg), SpO2 96 %. Body mass index is 73.34 kg/m. Physical Exam Vitals signs reviewed.  Constitutional:      Appearance: Normal appearance. She is obese.  Cardiovascular:     Rate and Rhythm: Normal rate.     Pulses: Normal pulses.  Pulmonary:     Effort: Pulmonary effort is normal.  Musculoskeletal: Normal range of motion.  Skin:    General: Skin is warm and dry.  Neurological:     Mental Status: She is alert and oriented to person, place, and time.  Psychiatric:        Mood and Affect: Mood normal.        Behavior: Behavior normal.     RECENT LABS AND TESTS: BMET    Component Value Date/Time   NA 141 07/25/2018 1234   K 4.5 07/25/2018 1234   CL 100 07/25/2018 1234   CO2 25 07/25/2018 1234   GLUCOSE 96 07/25/2018 1234   GLUCOSE 117 (H) 03/23/2018 1224   BUN 21 (H) 07/25/2018 1234   CREATININE 0.84 07/25/2018 1234   CREATININE 1.06 11/14/2016 1050   CALCIUM 9.6 07/25/2018 1234   CALCIUM 9.6 06/26/2007 0000   GFRNONAA 88 07/25/2018 1234   GFRAA 102 07/25/2018 1234   Lab Results  Component Value Date   HGBA1C 5.8 (H) 07/25/2018   HGBA1C 6.5 03/23/2018   HGBA1C 6.6 (H) 03/28/2017   HGBA1C 6.9 (H) 03/02/2016   HGBA1C 6.4 07/15/2013   Lab Results  Component Value Date   INSULIN 24.7 07/25/2018   INSULIN 25.0 (H) 04/17/2018   CBC    Component Value Date/Time   WBC 5.9 07/25/2018 1234   WBC 7.9  03/23/2018 1224   RBC 5.07 07/25/2018 1234   RBC 5.65 (H) 03/23/2018 1224   HGB 14.8 07/25/2018 1234   HCT 44.8 07/25/2018 1234   PLT 230.0 03/23/2018 1224  MCV 88 07/25/2018 1234   MCH 29.2 07/25/2018 1234   MCH 26.8 04/19/2017 0419   MCHC 33.0 07/25/2018 1234   MCHC 31.6 03/23/2018 1224   RDW 15.6 (H) 07/25/2018 1234   LYMPHSABS 1.4 07/25/2018 1234   MONOABS 0.7 04/16/2017 2351   EOSABS 0.2 07/25/2018 1234   BASOSABS 0.0 07/25/2018 1234   Iron/TIBC/Ferritin/ %Sat No results found for: IRON, TIBC, FERRITIN, IRONPCTSAT Lipid Panel     Component Value Date/Time   CHOL 147 07/25/2018 1234   TRIG 141 07/25/2018 1234   HDL 42 07/25/2018 1234   CHOLHDL 4 03/23/2018 1224   VLDL 29.6 03/23/2018 1224   LDLCALC 77 07/25/2018 1234   LDLDIRECT 141.0 03/02/2016 1101   Hepatic Function Panel     Component Value Date/Time   PROT 6.9 07/25/2018 1234   ALBUMIN 4.0 07/25/2018 1234   AST 18 07/25/2018 1234   ALT 21 07/25/2018 1234   ALKPHOS 73 07/25/2018 1234   BILITOT 0.5 07/25/2018 1234   BILIDIR 0.1 08/30/2016 1413   IBILI 0.5 12/25/2012 0223      Component Value Date/Time   TSH 1.600 04/17/2018 1239   TSH 3.96 03/23/2018 1224   TSH 1.310 12/16/2016 1438      OBESITY BEHAVIORAL INTERVENTION VISIT  Today's visit was # 14   Starting weight: 468 lbs Starting date: 04/17/18 Today's weight : 401 lbs Today's date: 11/15/2018 Total lbs lost to date: 48    ASK: We discussed the diagnosis of obesity with Duwayne Heck today and Lorice agreed to give [place] permission to discuss obesity behavioral modification therapy today.  ASSESS: Modesty has the diagnosis of obesity and her BMI today is 73.33 Yolette is in the action stage of change   ADVISE: Deseri was educated on the multiple health risks of obesity as well as the benefit of weight loss to improve her health. She was advised of the need for long term treatment and the importance of lifestyle modifications to improve her  current health and to decrease her risk of future health problems.  AGREE: Multiple dietary modification options and treatment options were discussed and  Alexandra Beck Beck agreed to follow the recommendations documented in the above note.  ARRANGE: Alexandra Beck Beck was educated on the importance of frequent visits to treat obesity as outlined per CMS and USPSTF guidelines and agreed to schedule her next follow up appointment today.  I, Trixie Dredge, am acting as transcriptionist for [name] Qua, MD  I have reviewed the above documentation for accuracy and completeness, and I agree with the above. - [name] Qua, MD

## 2018-11-16 ENCOUNTER — Ambulatory Visit: Payer: Self-pay | Admitting: Cardiology

## 2018-11-16 DIAGNOSIS — R0989 Other specified symptoms and signs involving the circulatory and respiratory systems: Secondary | ICD-10-CM

## 2018-11-19 ENCOUNTER — Encounter: Payer: Self-pay | Admitting: Cardiology

## 2018-12-03 ENCOUNTER — Other Ambulatory Visit: Payer: Self-pay | Admitting: Nurse Practitioner

## 2018-12-03 DIAGNOSIS — E039 Hypothyroidism, unspecified: Secondary | ICD-10-CM

## 2018-12-03 DIAGNOSIS — E1169 Type 2 diabetes mellitus with other specified complication: Secondary | ICD-10-CM

## 2018-12-03 DIAGNOSIS — E669 Obesity, unspecified: Principal | ICD-10-CM

## 2018-12-05 LAB — COMPREHENSIVE METABOLIC PANEL
A/G RATIO: 1.5 (ref 1.2–2.2)
ALT: 21 IU/L (ref 0–32)
AST: 17 IU/L (ref 0–40)
Albumin: 4 g/dL (ref 3.5–5.5)
Alkaline Phosphatase: 67 IU/L (ref 39–117)
BUN/Creatinine Ratio: 29 — ABNORMAL HIGH (ref 9–23)
BUN: 23 mg/dL — AB (ref 6–20)
Bilirubin Total: 0.4 mg/dL (ref 0.0–1.2)
CALCIUM: 9.4 mg/dL (ref 8.7–10.2)
CHLORIDE: 100 mmol/L (ref 96–106)
CO2: 24 mmol/L (ref 20–29)
Creatinine, Ser: 0.79 mg/dL (ref 0.57–1.00)
GFR calc Af Amer: 110 mL/min/{1.73_m2} (ref >59)
GFR, EST NON AFRICAN AMERICAN: 95 mL/min/{1.73_m2} (ref >59)
Globulin, Total: 2.6 g/dL (ref 1.5–4.5)
Glucose: 96 mg/dL (ref 65–99)
POTASSIUM: 4.7 mmol/L (ref 3.5–5.2)
Sodium: 140 mmol/L (ref 134–144)
Total Protein: 6.6 g/dL (ref 6.0–8.5)

## 2018-12-05 LAB — HEMOGLOBIN A1C
ESTIMATED AVERAGE GLUCOSE: 108 mg/dL
Hgb A1c MFr Bld: 5.4 % (ref 4.8–5.6)

## 2018-12-05 LAB — LIPID PANEL WITH LDL/HDL RATIO
CHOLESTEROL TOTAL: 170 mg/dL (ref 100–199)
HDL: 43 mg/dL (ref >39)
LDL Calculated: 92 mg/dL (ref 0–99)
LDl/HDL Ratio: 2.1 ratio (ref 0.0–3.2)
Triglycerides: 174 mg/dL — ABNORMAL HIGH (ref 0–149)
VLDL Cholesterol Cal: 35 mg/dL (ref 5–40)

## 2018-12-05 LAB — INSULIN, RANDOM: INSULIN: 25.8 u[IU]/mL — AB (ref 2.6–24.9)

## 2018-12-05 LAB — VITAMIN D 25 HYDROXY (VIT D DEFICIENCY, FRACTURES): Vit D, 25-Hydroxy: 37.4 ng/mL (ref 30.0–100.0)

## 2018-12-11 ENCOUNTER — Encounter (INDEPENDENT_AMBULATORY_CARE_PROVIDER_SITE_OTHER): Payer: Self-pay | Admitting: Family Medicine

## 2018-12-11 ENCOUNTER — Ambulatory Visit (INDEPENDENT_AMBULATORY_CARE_PROVIDER_SITE_OTHER): Payer: BC Managed Care – PPO | Admitting: Family Medicine

## 2018-12-11 VITALS — BP 126/64 | HR 63 | Temp 98.3°F | Ht 62.0 in | Wt 399.0 lb

## 2018-12-11 DIAGNOSIS — E038 Other specified hypothyroidism: Secondary | ICD-10-CM

## 2018-12-11 DIAGNOSIS — E1165 Type 2 diabetes mellitus with hyperglycemia: Secondary | ICD-10-CM

## 2018-12-11 DIAGNOSIS — Z794 Long term (current) use of insulin: Secondary | ICD-10-CM | POA: Diagnosis not present

## 2018-12-11 DIAGNOSIS — Z6841 Body Mass Index (BMI) 40.0 and over, adult: Secondary | ICD-10-CM

## 2018-12-12 NOTE — Progress Notes (Signed)
Office: [phone]  /  Fax: [phone]   HPI:   Chief Complaint: OBESITY Alexandra Beck is here to discuss her progress with her obesity treatment plan. She is on the Category 4 plan and is following her eating plan approximately 80 % of the time. She states she is exercising 0 minutes 0 times per week. Alexandra Beck had recently had a month off and somewhat experimented with recipes and enjoyed that. She started school again. Her wife just started the plan and the clinic. She reports she has been moving around easier.  Her weight is (!) 399 lb (181 kg) today and has had a weight loss of 2 pounds over a period of 3 to 4 weeks since her last visit. She has lost 69 lbs since starting treatment with [place].  Diabetes II with Hyperglycemia [name] has a diagnosis of diabetes type II. Alexandra Beck is on metformin, ARB, and statin. Improvement in Hgb A1c to 5.4 (from 5.8). she denies GI side effects of metformin. She denies hypoglycemia. She has been working on intensive lifestyle modifications including diet, exercise, and weight loss to help control her blood glucose levels.  Hypothyroidism Alexandra Beck has a diagnosis of hypothyroidism. She is on Synthroid. She denies hot or cold intolerance or palpitations, but notes improving fatigue.  ASSESSMENT AND PLAN:  Type 2 diabetes mellitus with hyperglycemia, with long-term current use of insulin (HCC)  Other specified hypothyroidism  Class 3 severe obesity with serious comorbidity and body mass index (BMI) greater than or equal to 70 in adult, unspecified obesity type ([place])  PLAN:  Diabetes II with Hyperglycemia Alexandra Beck has been given extensive diabetes education by myself today including ideal fasting and post-prandial blood glucose readings, individual ideal Hgb A1c goals and hypoglycemia prevention. We discussed the importance of good blood sugar control to decrease the likelihood of diabetic complications such as nephropathy, neuropathy, limb loss, blindness, coronary  artery disease, and death. We discussed the importance of intensive lifestyle modification including diet, exercise and weight loss as the first line treatment for diabetes. Talar agrees to continue her current diabetes medications, no change in medications at this time. Makeila agrees to follow up with our clinic in 2 weeks.  Hypothyroidism Alexandra Beck was informed of the importance of good thyroid control to help with weight loss efforts. She was also informed that supertheraputic thyroid levels are dangerous and will not improve weight loss results. Alexandra Beck agrees to continue taking Synthroid, and she agrees to follow up with our clinic in 2 weeks.  I spent > than 50% of the 15 minute visit on counseling as documented in the note.  Obesity Alexandra Beck is currently in the action stage of change. As such, her goal is to continue with weight loss efforts She has agreed to keep a food journal with 500-700 calories and 45+ grams of protein daily and follow the Category 4 plan Alexandra Beck has been instructed to work up to a goal of 150 minutes of combined cardio and strengthening exercise per week or physical activity for up to 15 minutes 2-3 times per week for weight loss and overall health benefits. We discussed the following Behavioral Modification Strategies today: increasing lean protein intake, increasing vegetables, work on meal planning and easy cooking plans, planning for success, and keep a strict food journal   Alexandra Beck has agreed to follow up with our clinic in 2 weeks. She was informed of the importance of frequent follow up visits to maximize her success with intensive lifestyle modifications for her multiple health conditions.  ALLERGIES: Allergies  Allergen Reactions  . Aspirin Hives and Swelling    REACTION: throat swelling, hives  . Doxycycline Other (See Comments)    Abdominal pain  . Lisinopril Cough  . Niaspan [Niacin Er]     Caused flushing    MEDICATIONS: Current Outpatient Medications on  File Prior to Visit  Medication Sig Dispense Refill  . amoxicillin (AMOXIL) 500 MG capsule TAKE 1 CAPSULE(500 MG) BY MOUTH TWICE DAILY 60 capsule 1  . cephALEXin (KEFLEX) 500 MG capsule TAKE 1 CAPSULE(500 MG) BY MOUTH FOUR TIMES DAILY 20 capsule 1  . furosemide (LASIX) 80 MG tablet Take 1 tablet (80 mg total) by mouth daily. 90 tablet 3  . levothyroxine (SYNTHROID, LEVOTHROID) 150 MCG tablet TAKE 1 TABLET(150 MCG) BY MOUTH DAILY BEFORE BREAKFAST 90 tablet 0  . losartan (COZAAR) 50 MG tablet Take 1 tablet (50 mg total) by mouth daily. 90 tablet 3  . metFORMIN (GLUCOPHAGE-XR) 500 MG 24 hr tablet TAKE 1 TABLET(500 MG) BY MOUTH DAILY WITH BREAKFAST 90 tablet 0  . metoprolol succinate (TOPROL-XL) 50 MG 24 hr tablet TAKE 1 TABLET(50 MG) BY MOUTH DAILY 90 tablet 0  . modafinil (PROVIGIL) 100 MG tablet Take 1 tablet (100 mg total) by mouth 3 (three) times daily. 90 tablet 0  . potassium chloride SA (K-DUR,KLOR-CON) 20 MEQ tablet TAKE 2 TABLETS BY MOUTH EVERY DAY 180 tablet 2  . rosuvastatin (CRESTOR) 10 MG tablet TAKE 1 TABLET BY MOUTH EVERY DAY 30 tablet 0  . traMADol (ULTRAM) 50 MG tablet Take 1-2 tablets (50-100 mg total) by mouth every 8 (eight) hours as needed. 20 tablet 0  . Vitamin D, Ergocalciferol, (DRISDOL) 1.25 MG (50000 UT) CAPS capsule Take 1 capsule (50,000 Units total) by mouth every 7 (seven) days. 4 capsule 0   No current facility-administered medications on file prior to visit.     PAST MEDICAL HISTORY: Past Medical History:  Diagnosis Date  . Acute renal failure (ARF) ([place]) 11/2012    multifactorial-likely secondary to ATN in the setting of sepsis and hypotension, and also from rhabdomyolysis  . Anemia   . Cellulitis   . Chronic diastolic CHF (congestive heart failure) ([place])   . Depression   . Diabetes mellitus ([place])   . GERD (gastroesophageal reflux disease)   . Hyperlipidemia   . Hypertension   . Hypothyroidism   . Morbid obesity with BMI of 70 and over, adult ([place])   .  Obesity hypoventilation syndrome ([place])   . OSA (obstructive sleep apnea)   . PVC's (premature ventricular contractions)   . Recurrent cellulitis of lower leg    LLE, venous insuff  . Sepsis ([place]) 11/2012   Secondary to cellulitis  . Sleep apnea   . Venous insufficiency of leg     PAST SURGICAL HISTORY: Past Surgical History:  Procedure Laterality Date  . IR FLUORO GUIDE CV MIDLINE PICC RIGHT  03/28/2017  . IR US GUIDE VASC ACCESS RIGHT  03/28/2017  . WISDOM TOOTH EXTRACTION      SOCIAL HISTORY: Social History   Tobacco Use  . Smoking status: Former Smoker    Years: 0.00  . Smokeless tobacco: Never Used  Substance Use Topics  . Alcohol use: No    Alcohol/week: 0.0 standard drinks  . Drug use: No    FAMILY HISTORY: Family History  Problem Relation Age of Onset  . Hypertension Mother   . Diabetes Mother   . High Cholesterol Mother   . Thyroid disease Mother   .  Sleep apnea Mother   . Obesity Mother   . Hypertension Father   . Heart disease Father        before age 73  . High Cholesterol Father   . Sleep apnea Father   . Obesity Father     ROS: Review of Systems  Constitutional: Positive for malaise/fatigue and weight loss.  Cardiovascular: Negative for palpitations.  Endo/Heme/Allergies:       Negative hypoglycemia Negative hot/cold intolerance    PHYSICAL EXAM: Blood pressure 126/64, pulse 63, temperature 98.3 F (36.8 C), temperature source Oral, height 5\' 2"  (1.575 m), weight (!) 399 lb (181 kg), SpO2 93 %. Body mass index is 72.98 kg/m. Physical Exam Vitals signs reviewed.  Constitutional:      Appearance: Normal appearance. She is obese.  Cardiovascular:     Rate and Rhythm: Normal rate.     Pulses: Normal pulses.  Pulmonary:     Effort: Pulmonary effort is normal.     Breath sounds: Normal breath sounds.  Musculoskeletal: Normal range of motion.  Skin:    General: Skin is warm and dry.  Neurological:     Mental Status: She is alert and  oriented to person, place, and time.  Psychiatric:        Mood and Affect: Mood normal.        Behavior: Behavior normal.     RECENT LABS AND TESTS: BMET    Component Value Date/Time   NA 140 12/04/2018 0812   K 4.7 12/04/2018 0812   CL 100 12/04/2018 0812   CO2 24 12/04/2018 0812   GLUCOSE 96 12/04/2018 0812   GLUCOSE 117 (H) 03/23/2018 1224   BUN 23 (H) 12/04/2018 0812   CREATININE 0.79 12/04/2018 0812   CREATININE 1.06 11/14/2016 1050   CALCIUM 9.4 12/04/2018 0812   CALCIUM 9.6 06/26/2007 0000   GFRNONAA 95 12/04/2018 0812   GFRAA 110 12/04/2018 0812   Lab Results  Component Value Date   HGBA1C 5.4 12/04/2018   HGBA1C 5.8 (H) 07/25/2018   HGBA1C 6.5 03/23/2018   HGBA1C 6.6 (H) 03/28/2017   HGBA1C 6.9 (H) 03/02/2016   Lab Results  Component Value Date   INSULIN 25.8 (H) 12/04/2018   INSULIN 24.7 07/25/2018   INSULIN 25.0 (H) 04/17/2018   CBC    Component Value Date/Time   WBC 5.9 07/25/2018 1234   WBC 7.9 03/23/2018 1224   RBC 5.07 07/25/2018 1234   RBC 5.65 (H) 03/23/2018 1224   HGB 14.8 07/25/2018 1234   HCT 44.8 07/25/2018 1234   PLT 230.0 03/23/2018 1224   MCV 88 07/25/2018 1234   MCH 29.2 07/25/2018 1234   MCH 26.8 04/19/2017 0419   MCHC 33.0 07/25/2018 1234   MCHC 31.6 03/23/2018 1224   RDW 15.6 (H) 07/25/2018 1234   LYMPHSABS 1.4 07/25/2018 1234   MONOABS 0.7 04/16/2017 2351   EOSABS 0.2 07/25/2018 1234   BASOSABS 0.0 07/25/2018 1234   Iron/TIBC/Ferritin/ %Sat No results found for: IRON, TIBC, FERRITIN, IRONPCTSAT Lipid Panel     Component Value Date/Time   CHOL 170 12/04/2018 0812   TRIG 174 (H) 12/04/2018 0812   HDL 43 12/04/2018 0812   CHOLHDL 4 03/23/2018 1224   VLDL 29.6 03/23/2018 1224   LDLCALC 92 12/04/2018 0812   LDLDIRECT 141.0 03/02/2016 1101   Hepatic Function Panel     Component Value Date/Time   PROT 6.6 12/04/2018 0812   ALBUMIN 4.0 12/04/2018 0812   AST 17 12/04/2018 0812   ALT 21  12/04/2018 0812   ALKPHOS 67  12/04/2018 0812   BILITOT 0.4 12/04/2018 0812   BILIDIR 0.1 08/30/2016 1413   IBILI 0.5 12/25/2012 0223      Component Value Date/Time   TSH 1.600 04/17/2018 1239   TSH 3.96 03/23/2018 1224   TSH 1.310 12/16/2016 1438      OBESITY BEHAVIORAL INTERVENTION VISIT  Today's visit was # 15   Starting weight: 468 lbs Starting date: 04/17/18 Today's weight : 399 lbs Today's date: 12/11/2018 Total lbs lost to date: 29    ASK: We discussed the diagnosis of obesity with Duwayne Heck today and Hayven agreed to give [place] permission to discuss obesity behavioral modification therapy today.  ASSESS: Lanah has the diagnosis of obesity and her BMI today is 72.96 Mckenlee is in the action stage of change   ADVISE: Kymberlyn was educated on the multiple health risks of obesity as well as the benefit of weight loss to improve her health. She was advised of the need for long term treatment and the importance of lifestyle modifications to improve her current health and to decrease her risk of future health problems.  AGREE: Multiple dietary modification options and treatment options were discussed and  Karletta agreed to follow the recommendations documented in the above note.  ARRANGE: Susen was educated on the importance of frequent visits to treat obesity as outlined per CMS and USPSTF guidelines and agreed to schedule her next follow up appointment today.  I, Trixie Dredge, am acting as transcriptionist for [name] Qua, MD  I have reviewed the above documentation for accuracy and completeness, and I agree with the above. - [name] Qua, MD

## 2018-12-16 ENCOUNTER — Other Ambulatory Visit (INDEPENDENT_AMBULATORY_CARE_PROVIDER_SITE_OTHER): Payer: Self-pay | Admitting: Family Medicine

## 2018-12-16 DIAGNOSIS — E559 Vitamin D deficiency, unspecified: Secondary | ICD-10-CM

## 2019-01-01 ENCOUNTER — Other Ambulatory Visit: Payer: Self-pay | Admitting: Internal Medicine

## 2019-01-01 ENCOUNTER — Encounter (INDEPENDENT_AMBULATORY_CARE_PROVIDER_SITE_OTHER): Payer: Self-pay | Admitting: Family Medicine

## 2019-01-01 ENCOUNTER — Ambulatory Visit (INDEPENDENT_AMBULATORY_CARE_PROVIDER_SITE_OTHER): Payer: BC Managed Care – PPO | Admitting: Family Medicine

## 2019-01-01 VITALS — BP 132/62 | HR 63 | Temp 98.0°F | Ht 62.0 in | Wt 396.0 lb

## 2019-01-01 DIAGNOSIS — L03119 Cellulitis of unspecified part of limb: Secondary | ICD-10-CM

## 2019-01-01 DIAGNOSIS — Z6841 Body Mass Index (BMI) 40.0 and over, adult: Secondary | ICD-10-CM

## 2019-01-01 DIAGNOSIS — E1165 Type 2 diabetes mellitus with hyperglycemia: Secondary | ICD-10-CM | POA: Diagnosis not present

## 2019-01-01 DIAGNOSIS — E559 Vitamin D deficiency, unspecified: Secondary | ICD-10-CM | POA: Diagnosis not present

## 2019-01-01 DIAGNOSIS — Z9189 Other specified personal risk factors, not elsewhere classified: Secondary | ICD-10-CM

## 2019-01-01 MED ORDER — VITAMIN D (ERGOCALCIFEROL) 1.25 MG (50000 UNIT) PO CAPS: ORAL_CAPSULE | ORAL | 0 refills | Status: DC

## 2019-01-02 ENCOUNTER — Other Ambulatory Visit: Payer: Self-pay | Admitting: Nurse Practitioner

## 2019-01-02 NOTE — Progress Notes (Signed)
Office: [phone]  /  Fax: [phone]   HPI:   Chief Complaint: OBESITY Alexandra Beck is here to discuss her progress with her obesity treatment plan. She is on the keep a food journal with 500-700 calories and 45+ grams of protein at supper daily and follow the Category 4 plan and is following her eating plan approximately 80 % of the time. She states she is walking for 5 minutes 2 times per week. Alexandra Beck has quite a bit of stress at work and with finances. She is occasionally skipping meals secondary to coming home from work late. Her wife is enrolled in the program, Alexandra Beck is helping her stay on the program which helps her stay on the program. Her weight is (!) 396 lb (179.6 kg) today and has had a weight loss of 3 pounds over a period of 3 weeks since her last visit. She has lost 72 lbs since starting treatment with [place].  Vitamin D Deficiency Alexandra Beck has a diagnosis of vitamin D deficiency. She is currently taking prescription Vit D. She notes fatigue and denies nausea, vomiting or muscle weakness.  At risk for osteopenia and osteoporosis Alexandra Beck is at higher risk of osteopenia and osteoporosis due to vitamin D deficiency.   Diabetes II with Hyperglycemia Alexandra Beck has a diagnosis of diabetes type II. Alexandra Beck denies GI side effects of metformin. Mostly controlled carbohydrate cravings. Last A1c was 5.4 on 12/04/2018. She denies hypoglycemia. She has been working on intensive lifestyle modifications including diet, exercise, and weight loss to help control her blood glucose levels.  ASSESSMENT AND PLAN:  Vitamin D deficiency - Plan: Vitamin D, Ergocalciferol, (DRISDOL) 1.25 MG (50000 UT) CAPS capsule  Type 2 diabetes mellitus with hyperglycemia, without long-term current use of insulin (HCC)  At risk for osteoporosis  Class 3 severe obesity with serious comorbidity and body mass index (BMI) greater than or equal to 70 in adult, unspecified obesity type ([place])  PLAN:  Vitamin D Deficiency Alexandra Beck was  informed that low vitamin D levels contributes to fatigue and are associated with obesity, breast, and colon cancer. Alexandra Beck agrees to continue taking prescription Vit D @50 ,000 IU every week #4 and we will refill for 1 month. She will follow up for routine testing of vitamin D, at least 2-3 times per year. She was informed of the risk of over-replacement of vitamin D and agrees to not increase her dose unless she discusses this with [place] first. Alexandra Beck agrees to follow up with our clinic in 3 weeks.  At risk for osteopenia and osteoporosis Alexandra Beck was given extended (15 minutes) osteoporosis prevention counseling today. Alexandra Beck is at risk for osteopenia and osteoporsis due to her vitamin D deficiency. She was encouraged to take her vitamin D and follow her higher calcium diet and increase strengthening exercise to help strengthen her bones and decrease her risk of osteopenia and osteoporosis.  Diabetes II with Hyperglycemia Alexandra Beck has been given extensive diabetes education by myself today including ideal fasting and post-prandial blood glucose readings, individual ideal Hgb A1c goals and hypoglycemia prevention. We discussed the importance of good blood sugar control to decrease the likelihood of diabetic complications such as nephropathy, neuropathy, limb loss, blindness, coronary artery disease, and death. We discussed the importance of intensive lifestyle modification including diet, exercise and weight loss as the first line treatment for diabetes. Alexandra Beck agrees to continue taking metformin, and she agrees to follow up with our clinic in 3 weeks.  Obesity Alexandra Beck is currently in the action stage of change.  As such, her goal is to continue with weight loss efforts She has agreed to keep a food journal with 300-400 calories and 25+ grams of protein at breakfast daily, journal with 400-550 calories and 35 grams of protein at lunch daily, and journal with 550-700 calories and 45 grams of protein at supper  daily Alexandra Beck has been instructed to work up to a goal of 150 minutes of combined cardio and strengthening exercise per week for weight loss and overall health benefits. We discussed the following Behavioral Modification Strategies today: increasing lean protein intake, increasing vegetables, no skipping meals, emotional eating strategies, and planning for success   Alexandra Beck has agreed to follow up with our clinic in 3 weeks. She was informed of the importance of frequent follow up visits to maximize her success with intensive lifestyle modifications for her multiple health conditions.  ALLERGIES: Allergies  Allergen Reactions  . Aspirin Hives and Swelling    REACTION: throat swelling, hives  . Doxycycline Other (See Comments)    Abdominal pain  . Lisinopril Cough  . Niaspan [Niacin Er]     Caused flushing    MEDICATIONS: Current Outpatient Medications on File Prior to Visit  Medication Sig Dispense Refill  . cephALEXin (KEFLEX) 500 MG capsule TAKE 1 CAPSULE(500 MG) BY MOUTH FOUR TIMES DAILY 20 capsule 1  . furosemide (LASIX) 80 MG tablet Take 1 tablet (80 mg total) by mouth daily. 90 tablet 3  . levothyroxine (SYNTHROID, LEVOTHROID) 150 MCG tablet TAKE 1 TABLET(150 MCG) BY MOUTH DAILY BEFORE BREAKFAST 90 tablet 0  . losartan (COZAAR) 50 MG tablet Take 1 tablet (50 mg total) by mouth daily. 90 tablet 3  . metFORMIN (GLUCOPHAGE-XR) 500 MG 24 hr tablet TAKE 1 TABLET(500 MG) BY MOUTH DAILY WITH BREAKFAST 90 tablet 0  . metoprolol succinate (TOPROL-XL) 50 MG 24 hr tablet TAKE 1 TABLET(50 MG) BY MOUTH DAILY 90 tablet 0  . modafinil (PROVIGIL) 100 MG tablet Take 1 tablet (100 mg total) by mouth 3 (three) times daily. 90 tablet 0  . potassium chloride SA (K-DUR,KLOR-CON) 20 MEQ tablet TAKE 2 TABLETS BY MOUTH EVERY DAY 180 tablet 2  . traMADol (ULTRAM) 50 MG tablet Take 1-2 tablets (50-100 mg total) by mouth every 8 (eight) hours as needed. 20 tablet 0   No current facility-administered  medications on file prior to visit.     PAST MEDICAL HISTORY: Past Medical History:  Diagnosis Date  . Acute renal failure (ARF) ([place]) 11/2012    multifactorial-likely secondary to ATN in the setting of sepsis and hypotension, and also from rhabdomyolysis  . Anemia   . Cellulitis   . Chronic diastolic CHF (congestive heart failure) ([place])   . Depression   . Diabetes mellitus ([place])   . GERD (gastroesophageal reflux disease)   . Hyperlipidemia   . Hypertension   . Hypothyroidism   . Morbid obesity with BMI of 70 and over, adult ([place])   . Obesity hypoventilation syndrome ([place])   . OSA (obstructive sleep apnea)   . PVC's (premature ventricular contractions)   . Recurrent cellulitis of lower leg    LLE, venous insuff  . Sepsis ([place]) 11/2012   Secondary to cellulitis  . Sleep apnea   . Venous insufficiency of leg     PAST SURGICAL HISTORY: Past Surgical History:  Procedure Laterality Date  . IR FLUORO GUIDE CV MIDLINE PICC RIGHT  03/28/2017  . IR US GUIDE VASC ACCESS RIGHT  03/28/2017  . WISDOM TOOTH EXTRACTION  SOCIAL HISTORY: Social History   Tobacco Use  . Smoking status: Former Smoker    Years: 0.00  . Smokeless tobacco: Never Used  Substance Use Topics  . Alcohol use: No    Alcohol/week: 0.0 standard drinks  . Drug use: No    FAMILY HISTORY: Family History  Problem Relation Age of Onset  . Hypertension Mother   . Diabetes Mother   . High Cholesterol Mother   . Thyroid disease Mother   . Sleep apnea Mother   . Obesity Mother   . Hypertension Father   . Heart disease Father        before age 66  . High Cholesterol Father   . Sleep apnea Father   . Obesity Father     ROS: Review of Systems  Constitutional: Positive for malaise/fatigue and weight loss.  Gastrointestinal: Negative for nausea and vomiting.  Musculoskeletal:       Negative muscle weakness  Endo/Heme/Allergies:       Negative hypoglycemia    PHYSICAL EXAM: Blood pressure 132/62,  pulse 63, temperature 98 F (36.7 C), temperature source Oral, height 5\' 2"  (1.575 m), weight (!) 396 lb (179.6 kg), SpO2 92 %. Body mass index is 72.43 kg/m. Physical Exam Vitals signs reviewed.  Constitutional:      Appearance: Normal appearance. She is obese.  Cardiovascular:     Rate and Rhythm: Normal rate.     Pulses: Normal pulses.  Pulmonary:     Effort: Pulmonary effort is normal.     Breath sounds: Normal breath sounds.  Musculoskeletal: Normal range of motion.  Skin:    General: Skin is warm and dry.  Neurological:     Mental Status: She is alert and oriented to person, place, and time.  Psychiatric:        Mood and Affect: Mood normal.        Behavior: Behavior normal.     RECENT LABS AND TESTS: BMET    Component Value Date/Time   NA 140 12/04/2018 0812   K 4.7 12/04/2018 0812   CL 100 12/04/2018 0812   CO2 24 12/04/2018 0812   GLUCOSE 96 12/04/2018 0812   GLUCOSE 117 (H) 03/23/2018 1224   BUN 23 (H) 12/04/2018 0812   CREATININE 0.79 12/04/2018 0812   CREATININE 1.06 11/14/2016 1050   CALCIUM 9.4 12/04/2018 0812   CALCIUM 9.6 06/26/2007 0000   GFRNONAA 95 12/04/2018 0812   GFRAA 110 12/04/2018 0812   Lab Results  Component Value Date   HGBA1C 5.4 12/04/2018   HGBA1C 5.8 (H) 07/25/2018   HGBA1C 6.5 03/23/2018   HGBA1C 6.6 (H) 03/28/2017   HGBA1C 6.9 (H) 03/02/2016   Lab Results  Component Value Date   INSULIN 25.8 (H) 12/04/2018   INSULIN 24.7 07/25/2018   INSULIN 25.0 (H) 04/17/2018   CBC    Component Value Date/Time   WBC 5.9 07/25/2018 1234   WBC 7.9 03/23/2018 1224   RBC 5.07 07/25/2018 1234   RBC 5.65 (H) 03/23/2018 1224   HGB 14.8 07/25/2018 1234   HCT 44.8 07/25/2018 1234   PLT 230.0 03/23/2018 1224   MCV 88 07/25/2018 1234   MCH 29.2 07/25/2018 1234   MCH 26.8 04/19/2017 0419   MCHC 33.0 07/25/2018 1234   MCHC 31.6 03/23/2018 1224   RDW 15.6 (H) 07/25/2018 1234   LYMPHSABS 1.4 07/25/2018 1234   MONOABS 0.7 04/16/2017 2351    EOSABS 0.2 07/25/2018 1234   BASOSABS 0.0 07/25/2018 1234   Iron/TIBC/Ferritin/ %Sat  No results found for: IRON, TIBC, FERRITIN, IRONPCTSAT Lipid Panel     Component Value Date/Time   CHOL 170 12/04/2018 0812   TRIG 174 (H) 12/04/2018 0812   HDL 43 12/04/2018 0812   CHOLHDL 4 03/23/2018 1224   VLDL 29.6 03/23/2018 1224   LDLCALC 92 12/04/2018 0812   LDLDIRECT 141.0 03/02/2016 1101   Hepatic Function Panel     Component Value Date/Time   PROT 6.6 12/04/2018 0812   ALBUMIN 4.0 12/04/2018 0812   AST 17 12/04/2018 0812   ALT 21 12/04/2018 0812   ALKPHOS 67 12/04/2018 0812   BILITOT 0.4 12/04/2018 0812   BILIDIR 0.1 08/30/2016 1413   IBILI 0.5 12/25/2012 0223      Component Value Date/Time   TSH 1.600 04/17/2018 1239   TSH 3.96 03/23/2018 1224   TSH 1.310 12/16/2016 1438      OBESITY BEHAVIORAL INTERVENTION VISIT  Today's visit was # 16   Starting weight: 468 lbs Starting date: 04/17/18 Today's weight : 396 lbs Today's date: 01/01/2019 Total lbs lost to date: 56    ASK: We discussed the diagnosis of obesity with Alexandra Beck today and Alexandra Beck agreed to give [place] permission to discuss obesity behavioral modification therapy today.  ASSESS: Alexandra Beck has the diagnosis of obesity and her BMI today is 72.41 Alexandra Beck is in the action stage of change   ADVISE: Alexandra Beck was educated on the multiple health risks of obesity as well as the benefit of weight loss to improve her health. She was advised of the need for long term treatment and the importance of lifestyle modifications to improve her current health and to decrease her risk of future health problems.  AGREE: Multiple dietary modification options and treatment options were discussed and  Alexandra Beck agreed to follow the recommendations documented in the above note.  ARRANGE: Alexandra Beck was educated on the importance of frequent visits to treat obesity as outlined per CMS and USPSTF guidelines and agreed to schedule her next follow up  appointment today.  I, Trixie Dredge, am acting as transcriptionist for [name] Qua, MD  I have reviewed the above documentation for accuracy and completeness, and I agree with the above. - [name] Qua, MD

## 2019-01-03 ENCOUNTER — Ambulatory Visit: Payer: BC Managed Care – PPO | Admitting: Internal Medicine

## 2019-01-11 ENCOUNTER — Other Ambulatory Visit: Payer: Self-pay | Admitting: Pulmonary Disease

## 2019-01-11 ENCOUNTER — Telehealth: Payer: Self-pay | Admitting: Pulmonary Disease

## 2019-01-11 ENCOUNTER — Telehealth: Payer: Self-pay | Admitting: General Surgery

## 2019-01-11 NOTE — Telephone Encounter (Signed)
Please see medication refill encounter dated as 01/11/19.

## 2019-01-11 NOTE — Telephone Encounter (Signed)
Unfortunately I am not overly familiar with prescribing this medication.  This is a different prescription then requested.  Will defer to Dr. Elsworth Soho.    Please route this question to Dr. Elsworth Soho for follow-up, and he confirmed that he is okay with the patient taking the medication as she states she has.  Wyn Quaker, FNP

## 2019-01-11 NOTE — Telephone Encounter (Signed)
Please see medication refill request dated as 01/11/19.

## 2019-01-11 NOTE — Telephone Encounter (Signed)
RA please advise on refill. Thanks.  Pt is aware that a message has been sent to RA.

## 2019-01-11 NOTE — Telephone Encounter (Signed)
LVM patient to call back. Based on OV note dated 10/12/18 the patient was directed to contact clinic to let Dr. Elsworth Soho know if the adjustment in her taking the Provigil 100 mg has been working for her. She was directed to take 200 mg at noon and 100 mg at 5 pm.  Escript has been received for a 90 day supply. Need to confirm if she has been taking the medication as Dr. Elsworth Soho directed or not before approval or denial of the prescription.

## 2019-01-11 NOTE — Telephone Encounter (Signed)
There is another telephone encounter from H[place] D[place] addressing the fact that the patient may not be taking the medication correctly.  Does this even the correct dosing?  Is anyone spoken to the patient?  Aaron Edelman

## 2019-01-11 NOTE — Telephone Encounter (Signed)
Called and spoke to pt. Pt states she is taking 3 100mg  of Provigil daily.  Pt states she tried 200mg  at noon and 100mg  at 5:00p. Pt states this did not work well for her, as she would sometimes forgot her nightly dose.   Aaron Edelman please advise. Thanks

## 2019-01-11 NOTE — Telephone Encounter (Signed)
Patient is returning phone call. Patient phone number is [phone]. 

## 2019-01-11 NOTE — Telephone Encounter (Signed)
lmtcb x1 for pt. 

## 2019-01-11 NOTE — Telephone Encounter (Signed)
LVM for patient to return call. X1 

## 2019-01-11 NOTE — Telephone Encounter (Signed)
Called and spoke pt. Pt is requesting refill on Provigil 100mg .  Last refilled 10/15/18 # 90 with pending apt with RA 02/22/19.  Pt stated she will run out of medication on Sunday.   Aaron Edelman, please advise, as RA is unavailable.

## 2019-01-11 NOTE — Telephone Encounter (Signed)
Patient returning call, [phone].

## 2019-01-14 NOTE — Telephone Encounter (Addendum)
Rx for Provigil 100mg  TID has been sent to preferred pharmacy by Dr. Elsworth Soho. Lm to make pt aware.

## 2019-01-15 ENCOUNTER — Telehealth: Payer: Self-pay | Admitting: Pulmonary Disease

## 2019-01-15 NOTE — Telephone Encounter (Signed)
Patient is aware nothing further is needed at this time. 

## 2019-01-15 NOTE — Telephone Encounter (Signed)
Patient believes she is calling Ria Comment back from 01/14/19 message but not sure what it was for.

## 2019-01-15 NOTE — Telephone Encounter (Signed)
Pt is calling back [phone]

## 2019-01-15 NOTE — Telephone Encounter (Signed)
Copied from 2/14 refill encounter:  Maryanna Shape A, CMA     Rx for Provigil 100mg  TID has been sent to preferred pharmacy by Dr. Elsworth Soho. Lm to make pt aware.      lmtcb X1 for pt

## 2019-01-22 ENCOUNTER — Ambulatory Visit (INDEPENDENT_AMBULATORY_CARE_PROVIDER_SITE_OTHER): Payer: BC Managed Care – PPO | Admitting: Family Medicine

## 2019-01-22 ENCOUNTER — Encounter (INDEPENDENT_AMBULATORY_CARE_PROVIDER_SITE_OTHER): Payer: Self-pay | Admitting: Family Medicine

## 2019-01-22 VITALS — BP 135/68 | HR 62 | Temp 98.5°F | Ht 62.0 in | Wt 390.0 lb

## 2019-01-22 DIAGNOSIS — Z6841 Body Mass Index (BMI) 40.0 and over, adult: Secondary | ICD-10-CM

## 2019-01-22 DIAGNOSIS — E559 Vitamin D deficiency, unspecified: Secondary | ICD-10-CM

## 2019-01-22 DIAGNOSIS — E038 Other specified hypothyroidism: Secondary | ICD-10-CM | POA: Diagnosis not present

## 2019-01-23 NOTE — Progress Notes (Signed)
Office: [phone]  /  Fax: [phone]   HPI:   Chief Complaint: OBESITY Dimples is here to discuss her progress with her obesity treatment plan. She is on the keep a food journal with 300-400 calories and 25+ grams of protein at breakfast, journal with 400-550 calories and 35 grams of protein at lunch, and journal with 550-700 calories and 45 grams of protein at supper and is following her eating plan approximately 90 % of the time. She states she is exercising 0 minutes 0 times per week. Talishia has been working the plan to the best of her ability while supporting her wife, who is also in the program. She has been journaling for dinner and has been more creative with dinner.  Her weight is (!) 390 lb (176.9 kg) today and has had a weight loss of 6 pounds over a period of 3 weeks since her last visit. She has lost 78 lbs since starting treatment with [place].  Vitamin D Deficiency Cammie has a diagnosis of vitamin D deficiency. She is currently taking prescription Vit D. She notes fatigue and denies nausea, vomiting or muscle weakness.  Hypothyroidism Yarithza has a diagnosis of hypothyroidism. She is on Synthroid. She denies hot or cold intolerance or palpitations, but does admit to ongoing fatigue.  ASSESSMENT AND PLAN:  Vitamin D deficiency  Other specified hypothyroidism  Class 3 severe obesity with serious comorbidity and body mass index (BMI) greater than or equal to 70 in adult, unspecified obesity type (HCC)  PLAN:  Vitamin D Deficiency Uilani was informed that low vitamin D levels contributes to fatigue and are associated with obesity, breast, and colon cancer. Bradyn agrees to continue taking prescription Vit D @50 ,000 IU every week and will follow up for routine testing of vitamin D, at least 2-3 times per year. She was informed of the risk of over-replacement of vitamin D and agrees to not increase her dose unless she discusses this with [place] first. Lillyona agrees to follow up with our  clinic in 2 to 3 weeks.  Hypothyroidism Clyde was informed of the importance of good thyroid control to help with weight loss efforts. She was also informed that supertheraputic thyroid levels are dangerous and will not improve weight loss results. Danyela agrees continue taking Synthroid and she agrees to follow up with our clinic in 2 to 3 weeks.  I spent > than 50% of the 15 minute visit on counseling as documented in the note.  Obesity Kazia is currently in the action stage of change. As such, her goal is to continue with weight loss efforts She has agreed to follow the Category 4 plan Allisen has been instructed to work up to a goal of 150 minutes of combined cardio and strengthening exercise per week for weight loss and overall health benefits. We discussed the following Behavioral Modification Strategies today: increasing lean protein intake, increasing vegetables and work on meal planning and easy cooking plans, and planning for success   Teegan has agreed to follow up with our clinic in 2 to 3 weeks. She was informed of the importance of frequent follow up visits to maximize her success with intensive lifestyle modifications for her multiple health conditions.  ALLERGIES: Allergies  Allergen Reactions  . Aspirin Hives and Swelling    REACTION: throat swelling, hives  . Doxycycline Other (See Comments)    Abdominal pain  . Lisinopril Cough  . Niaspan [Niacin Er]     Caused flushing    MEDICATIONS: Current Outpatient  Medications on File Prior to Visit  Medication Sig Dispense Refill  . amoxicillin (AMOXIL) 500 MG capsule TAKE 1 CAPSULE(500 MG) BY MOUTH TWICE DAILY 60 capsule 0  . cephALEXin (KEFLEX) 500 MG capsule TAKE 1 CAPSULE(500 MG) BY MOUTH FOUR TIMES DAILY 20 capsule 1  . furosemide (LASIX) 80 MG tablet Take 1 tablet (80 mg total) by mouth daily. 90 tablet 3  . levothyroxine (SYNTHROID, LEVOTHROID) 150 MCG tablet TAKE 1 TABLET(150 MCG) BY MOUTH DAILY BEFORE BREAKFAST 90  tablet 0  . losartan (COZAAR) 50 MG tablet Take 1 tablet (50 mg total) by mouth daily. 90 tablet 3  . metFORMIN (GLUCOPHAGE-XR) 500 MG 24 hr tablet TAKE 1 TABLET(500 MG) BY MOUTH DAILY WITH BREAKFAST 90 tablet 0  . metoprolol succinate (TOPROL-XL) 50 MG 24 hr tablet TAKE 1 TABLET(50 MG) BY MOUTH DAILY 90 tablet 0  . modafinil (PROVIGIL) 100 MG tablet TAKE 1 TABLET BY MOUTH THREE TIMES DAILY 90 tablet 3  . potassium chloride SA (K-DUR,KLOR-CON) 20 MEQ tablet TAKE 2 TABLETS BY MOUTH EVERY DAY 180 tablet 2  . rosuvastatin (CRESTOR) 10 MG tablet TAKE 1 TABLET BY MOUTH EVERY DAY 30 tablet 1  . traMADol (ULTRAM) 50 MG tablet Take 1-2 tablets (50-100 mg total) by mouth every 8 (eight) hours as needed. 20 tablet 0  . Vitamin D, Ergocalciferol, (DRISDOL) 1.25 MG (50000 UT) CAPS capsule TAKE 1 CAPSULE BY MOUTH EVERY 7 DAYS 4 capsule 0   No current facility-administered medications on file prior to visit.     PAST MEDICAL HISTORY: Past Medical History:  Diagnosis Date  . Acute renal failure (ARF) ([place]) 11/2012    multifactorial-likely secondary to ATN in the setting of sepsis and hypotension, and also from rhabdomyolysis  . Anemia   . Cellulitis   . Chronic diastolic CHF (congestive heart failure) ([place])   . Depression   . Diabetes mellitus ([place])   . GERD (gastroesophageal reflux disease)   . Hyperlipidemia   . Hypertension   . Hypothyroidism   . Morbid obesity with BMI of 70 and over, adult ([place])   . Obesity hypoventilation syndrome ([place])   . OSA (obstructive sleep apnea)   . PVC's (premature ventricular contractions)   . Recurrent cellulitis of lower leg    LLE, venous insuff  . Sepsis ([place]) 11/2012   Secondary to cellulitis  . Sleep apnea   . Venous insufficiency of leg     PAST SURGICAL HISTORY: Past Surgical History:  Procedure Laterality Date  . IR FLUORO GUIDE CV MIDLINE PICC RIGHT  03/28/2017  . IR US GUIDE VASC ACCESS RIGHT  03/28/2017  . WISDOM TOOTH EXTRACTION      SOCIAL  HISTORY: Social History   Tobacco Use  . Smoking status: Former Smoker    Years: 0.00  . Smokeless tobacco: Never Used  Substance Use Topics  . Alcohol use: No    Alcohol/week: 0.0 standard drinks  . Drug use: No    FAMILY HISTORY: Family History  Problem Relation Age of Onset  . Hypertension Mother   . Diabetes Mother   . High Cholesterol Mother   . Thyroid disease Mother   . Sleep apnea Mother   . Obesity Mother   . Hypertension Father   . Heart disease Father        before age 54  . High Cholesterol Father   . Sleep apnea Father   . Obesity Father     ROS: Review of Systems  Constitutional: Positive for  malaise/fatigue and weight loss.  Cardiovascular: Negative for palpitations.  Gastrointestinal: Negative for nausea and vomiting.  Musculoskeletal:       Negative muscle weakness  Endo/Heme/Allergies:       Negative hot/cold intolerance    PHYSICAL EXAM: Blood pressure 135/68, pulse 62, temperature 98.5 F (36.9 C), temperature source Oral, height 5\' 2"  (1.575 m), weight (!) 390 lb (176.9 kg), SpO2 92 %. Body mass index is 71.33 kg/m. Physical Exam Vitals signs reviewed.  Constitutional:      Appearance: Normal appearance. She is obese.  Cardiovascular:     Rate and Rhythm: Normal rate.     Pulses: Normal pulses.  Pulmonary:     Effort: Pulmonary effort is normal.     Breath sounds: Normal breath sounds.  Musculoskeletal: Normal range of motion.  Skin:    General: Skin is warm and dry.  Neurological:     Mental Status: She is alert and oriented to person, place, and time.  Psychiatric:        Mood and Affect: Mood normal.        Behavior: Behavior normal.     RECENT LABS AND TESTS: BMET    Component Value Date/Time   NA 140 12/04/2018 0812   K 4.7 12/04/2018 0812   CL 100 12/04/2018 0812   CO2 24 12/04/2018 0812   GLUCOSE 96 12/04/2018 0812   GLUCOSE 117 (H) 03/23/2018 1224   BUN 23 (H) 12/04/2018 0812   CREATININE 0.79 12/04/2018 0812     CREATININE 1.06 11/14/2016 1050   CALCIUM 9.4 12/04/2018 0812   CALCIUM 9.6 06/26/2007 0000   GFRNONAA 95 12/04/2018 0812   GFRAA 110 12/04/2018 0812   Lab Results  Component Value Date   HGBA1C 5.4 12/04/2018   HGBA1C 5.8 (H) 07/25/2018   HGBA1C 6.5 03/23/2018   HGBA1C 6.6 (H) 03/28/2017   HGBA1C 6.9 (H) 03/02/2016   Lab Results  Component Value Date   INSULIN 25.8 (H) 12/04/2018   INSULIN 24.7 07/25/2018   INSULIN 25.0 (H) 04/17/2018   CBC    Component Value Date/Time   WBC 5.9 07/25/2018 1234   WBC 7.9 03/23/2018 1224   RBC 5.07 07/25/2018 1234   RBC 5.65 (H) 03/23/2018 1224   HGB 14.8 07/25/2018 1234   HCT 44.8 07/25/2018 1234   PLT 230.0 03/23/2018 1224   MCV 88 07/25/2018 1234   MCH 29.2 07/25/2018 1234   MCH 26.8 04/19/2017 0419   MCHC 33.0 07/25/2018 1234   MCHC 31.6 03/23/2018 1224   RDW 15.6 (H) 07/25/2018 1234   LYMPHSABS 1.4 07/25/2018 1234   MONOABS 0.7 04/16/2017 2351   EOSABS 0.2 07/25/2018 1234   BASOSABS 0.0 07/25/2018 1234   Iron/TIBC/Ferritin/ %Sat No results found for: IRON, TIBC, FERRITIN, IRONPCTSAT Lipid Panel     Component Value Date/Time   CHOL 170 12/04/2018 0812   TRIG 174 (H) 12/04/2018 0812   HDL 43 12/04/2018 0812   CHOLHDL 4 03/23/2018 1224   VLDL 29.6 03/23/2018 1224   LDLCALC 92 12/04/2018 0812   LDLDIRECT 141.0 03/02/2016 1101   Hepatic Function Panel     Component Value Date/Time   PROT 6.6 12/04/2018 0812   ALBUMIN 4.0 12/04/2018 0812   AST 17 12/04/2018 0812   ALT 21 12/04/2018 0812   ALKPHOS 67 12/04/2018 0812   BILITOT 0.4 12/04/2018 0812   BILIDIR 0.1 08/30/2016 1413   IBILI 0.5 12/25/2012 0223      Component Value Date/Time   TSH 1.600 04/17/2018 1239  TSH 3.96 03/23/2018 1224   TSH 1.310 12/16/2016 1438      OBESITY BEHAVIORAL INTERVENTION VISIT  Today's visit was # 17   Starting weight: 468 lbs Starting date: 04/17/18 Today's weight : 390 lbs  Today's date: 01/22/2019 Total lbs lost to  date: 78    01/22/2019  Height 5\' 2"  (1.575 m)  Weight 390 lb (176.9 kg) (A)  BMI (Calculated) 71.31  BLOOD PRESSURE - SYSTOLIC 161  BLOOD PRESSURE - DIASTOLIC 68   Body Fat % 09.6 %     ASK: We discussed the diagnosis of obesity with Duwayne Heck today and [place] agreed to give [place] permission to discuss obesity behavioral modification therapy today.  ASSESS: Takako has the diagnosis of obesity and her BMI today is 71.31 Darci is in the action stage of change   ADVISE: Edessa was educated on the multiple health risks of obesity as well as the benefit of weight loss to improve her health. She was advised of the need for long term treatment and the importance of lifestyle modifications to improve her current health and to decrease her risk of future health problems.  AGREE: Multiple dietary modification options and treatment options were discussed and  Airianna agreed to follow the recommendations documented in the above note.  ARRANGE: Athenia was educated on the importance of frequent visits to treat obesity as outlined per CMS and USPSTF guidelines and agreed to schedule her next follow up appointment today.  I, Trixie Dredge, am acting as transcriptionist for [name] Qua, MD  I have reviewed the above documentation for accuracy and completeness, and I agree with the above. - [name] Qua, MD

## 2019-01-29 ENCOUNTER — Ambulatory Visit: Payer: BC Managed Care – PPO | Admitting: Internal Medicine

## 2019-01-29 ENCOUNTER — Other Ambulatory Visit: Payer: Self-pay

## 2019-01-29 VITALS — BP 144/90 | HR 64 | Temp 98.7°F | Wt 395.0 lb

## 2019-01-29 DIAGNOSIS — L03119 Cellulitis of unspecified part of limb: Secondary | ICD-10-CM

## 2019-01-29 DIAGNOSIS — L409 Psoriasis, unspecified: Secondary | ICD-10-CM

## 2019-01-29 MED ORDER — MOMETASONE FUROATE 0.1 % EX CREA: 1.0000 "application " | TOPICAL_CREAM | Freq: Every day | CUTANEOUS | 0 refills | Status: DC

## 2019-01-29 MED ORDER — AMOXICILLIN 500 MG PO CAPS: ORAL_CAPSULE | ORAL | 3 refills | Status: DC

## 2019-01-29 NOTE — Progress Notes (Signed)
Rfv: cellulitis  Patient ID: Alexandra Beck, female   DOB: 1981/07/05, 38 y.o.   MRN: [id number]  HPI psoriaris Continue on current abtx  Outpatient Encounter Medications as of 01/29/2019  Medication Sig  . amoxicillin (AMOXIL) 500 MG capsule TAKE 1 CAPSULE(500 MG) BY MOUTH TWICE DAILY  . cephALEXin (KEFLEX) 500 MG capsule TAKE 1 CAPSULE(500 MG) BY MOUTH FOUR TIMES DAILY  . furosemide (LASIX) 80 MG tablet Take 1 tablet (80 mg total) by mouth daily.  Marland Kitchen levothyroxine (SYNTHROID, LEVOTHROID) 150 MCG tablet TAKE 1 TABLET(150 MCG) BY MOUTH DAILY BEFORE BREAKFAST  . losartan (COZAAR) 50 MG tablet Take 1 tablet (50 mg total) by mouth daily.  . metFORMIN (GLUCOPHAGE-XR) 500 MG 24 hr tablet TAKE 1 TABLET(500 MG) BY MOUTH DAILY WITH BREAKFAST  . metoprolol succinate (TOPROL-XL) 50 MG 24 hr tablet TAKE 1 TABLET(50 MG) BY MOUTH DAILY  . modafinil (PROVIGIL) 100 MG tablet TAKE 1 TABLET BY MOUTH THREE TIMES DAILY  . potassium chloride SA (K-DUR,KLOR-CON) 20 MEQ tablet TAKE 2 TABLETS BY MOUTH EVERY DAY  . rosuvastatin (CRESTOR) 10 MG tablet TAKE 1 TABLET BY MOUTH EVERY DAY  . Vitamin D, Ergocalciferol, (DRISDOL) 1.25 MG (50000 UT) CAPS capsule TAKE 1 CAPSULE BY MOUTH EVERY 7 DAYS  . [DISCONTINUED] traMADol (ULTRAM) 50 MG tablet Take 1-2 tablets (50-100 mg total) by mouth every 8 (eight) hours as needed. (Patient not taking: Reported on 01/29/2019)   No facility-administered encounter medications on file as of 01/29/2019.      Patient Active Problem List   Diagnosis Date Noted  . 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 Hypersomnolence 05/11/2018  . Vitamin D deficiency 05/02/2018  . Cellulitis of left leg 04/17/2017  . Cellulitis of right leg 01/11/2017  . PVC's (premature ventricular contractions) 12/02/2016  . Chronic diastolic CHF (congestive heart failure) (HCC)   . Acute kidney injury (HCC)   . Hyperlipidemia   . Diabetes mellitus type 2 in obese  (HCC)   . Hypothyroidism   . GERD (gastroesophageal reflux disease)   . Depression   . Venous insufficiency of leg   . Recurrent cellulitis of lower leg 12/25/2012  . OSA (obstructive sleep apnea) 12/25/2012  . Obesity hypoventilation syndrome (HCC) 12/25/2012  . Morbid obesity with BMI of 70 and over, adult (HCC) 12/24/2012  . Essential hypertension 06/26/2007     Health Maintenance Due  Topic Date Due  . PAP SMEAR-Modifier  06/01/2001  . FOOT EXAM  08/02/2014  . INFLUENZA VACCINE  06/28/2018     Review of Systems  Physical Exam   Wt (!) 395 lb (179.2 kg)   LMP 01/07/2019   BMI 72.25 kg/m    No results found for: CD4TCELL No results found for: CD4TABS No results found for: HIV1RNAQUANT No results found for: HEPBSAB No results found for: RPR, LABRPR  CBC Lab Results  Component Value Date   WBC 5.9 07/25/2018   RBC 5.07 07/25/2018   HGB 14.8 07/25/2018   HCT 44.8 07/25/2018   PLT 230.0 03/23/2018   MCV 88 07/25/2018   MCH 29.2 07/25/2018   MCHC 33.0 07/25/2018   RDW 15.6 (H) 07/25/2018   LYMPHSABS 1.4 07/25/2018   MONOABS 0.7 04/16/2017   EOSABS 0.2 07/25/2018    BMET Lab Results  Component Value Date   NA 140 12/04/2018   K 4.7 12/04/2018   CL 100 12/04/2018   CO2 24 12/04/2018   GLUCOSE 96 12/04/2018   BUN  23 (H) 12/04/2018   CREATININE 0.79 12/04/2018   CALCIUM 9.4 12/04/2018   GFRNONAA 95 12/04/2018   GFRAA 110 12/04/2018      Assessment and Plan Will do trial of elecon and send to derm

## 2019-02-11 ENCOUNTER — Encounter (INDEPENDENT_AMBULATORY_CARE_PROVIDER_SITE_OTHER): Payer: Self-pay | Admitting: Family Medicine

## 2019-02-11 ENCOUNTER — Encounter: Payer: Self-pay | Admitting: Nurse Practitioner

## 2019-02-12 ENCOUNTER — Ambulatory Visit (INDEPENDENT_AMBULATORY_CARE_PROVIDER_SITE_OTHER): Payer: BC Managed Care – PPO | Admitting: Family Medicine

## 2019-02-12 ENCOUNTER — Encounter (INDEPENDENT_AMBULATORY_CARE_PROVIDER_SITE_OTHER): Payer: Self-pay | Admitting: Family Medicine

## 2019-02-12 ENCOUNTER — Other Ambulatory Visit: Payer: Self-pay

## 2019-02-12 VITALS — BP 138/70 | HR 65 | Temp 98.6°F | Ht 62.0 in | Wt 393.0 lb

## 2019-02-12 DIAGNOSIS — I5032 Chronic diastolic (congestive) heart failure: Secondary | ICD-10-CM

## 2019-02-12 DIAGNOSIS — E559 Vitamin D deficiency, unspecified: Secondary | ICD-10-CM

## 2019-02-12 DIAGNOSIS — Z6841 Body Mass Index (BMI) 40.0 and over, adult: Secondary | ICD-10-CM | POA: Diagnosis not present

## 2019-02-13 ENCOUNTER — Encounter (INDEPENDENT_AMBULATORY_CARE_PROVIDER_SITE_OTHER): Payer: Self-pay

## 2019-02-13 NOTE — Progress Notes (Signed)
Office: [phone]  /  Fax: [phone]   HPI:   Chief Complaint: OBESITY Alexandra Beck is here to discuss her progress with her obesity treatment plan. She is on the Category 4 plan and is following her eating plan approximately 87 % of the time. She states she is doing resistance training for 15-30 minutes 2-3 times per week. 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 Her wife works in [employer] and she has had to work later prior to remote operations. This has caused her to miss her 3rd meal occasionally. She has felt an increase in stress with preparing meals for her wife and herself for meal plan.  Her weight is (!) 393 lb (178.3 kg) today and has gained 3 pounds since her last visit. She has lost 75 lbs since starting treatment with [place].  Vitamin D Deficiency [name] has a diagnosis of vitamin D deficiency. She is currently taking prescription Vit D. She notes fatigue and denies nausea, vomiting or muscle weakness.  Congestive Heart Failure Ketzaly has congestive heart failure. She denies orthopnea or edema. Her blood pressure is controlled today. She denies chest pain, chest pressure, or headaches.  ASSESSMENT AND PLAN:  Vitamin D deficiency  Chronic diastolic CHF (congestive heart failure) (HCC)  Class 3 severe obesity with serious comorbidity and body mass index (BMI) greater than or equal to 70 in adult, unspecified obesity type ([place])  PLAN:  Vitamin D Deficiency [name] was informed that low vitamin D levels contributes to fatigue and are associated with obesity, breast, and colon cancer. [name] agrees to continue taking prescription Vit D @50 ,000 IU every week, no refill needed. She will follow up for routine testing of vitamin D, at least 2-3 times per year. She was informed of the risk of over-replacement of vitamin D and agrees to not increase her dose unless she discusses this with [place] first. [name] agrees to follow up with our clinic in 2 weeks.  Congestive Heart Failure  [name] agrees to continue taking her current medications, and she agrees to follow up with our clinic in 2 weeks.  I spent > than 50% of the 15 minute visit on counseling as documented in the note.  Obesity [name] is currently in the action stage of change. As such, her goal is to continue with weight loss efforts She has agreed to follow the Category 4 plan [name] has been instructed to work up to a goal of 150 minutes of combined cardio and strengthening exercise per week for weight loss and overall health benefits. We discussed the following Behavioral Modification Strategies today: increasing lean protein intake, increasing vegetables, work on meal planning and easy cooking plans, better snacking choices, and planning for success   [name] has agreed to follow up with our clinic in 2 weeks. She was informed of the importance of frequent follow up visits to maximize her success with intensive lifestyle modifications for her multiple health conditions.  ALLERGIES: Allergies  Allergen Reactions  . Aspirin Hives and Swelling    REACTION: throat swelling, hives  . Doxycycline Other (See Comments)    Abdominal pain  . Lisinopril Cough  . Niaspan [Niacin Er]     Caused flushing    MEDICATIONS: Current Outpatient Medications on File Prior to Visit  Medication Sig Dispense Refill  . amoxicillin (AMOXIL) 500 MG capsule TAKE 1 CAPSULE(500 MG) BY MOUTH TWICE DAILY 60 capsule 3  . furosemide (LASIX) 80 MG tablet Take 1 tablet (80 mg total) by mouth daily.  90 tablet 3  . levothyroxine (SYNTHROID, LEVOTHROID) 150 MCG tablet TAKE 1 TABLET(150 MCG) BY MOUTH DAILY BEFORE BREAKFAST 90 tablet 0  . losartan (COZAAR) 50 MG tablet Take 1 tablet (50 mg total) by mouth daily. 90 tablet 3  . metFORMIN (GLUCOPHAGE-XR) 500 MG 24 hr tablet TAKE 1 TABLET(500 MG) BY MOUTH DAILY WITH BREAKFAST 90 tablet 0  . metoprolol succinate (TOPROL-XL) 50 MG 24 hr tablet TAKE 1 TABLET(50 MG) BY MOUTH DAILY 90 tablet 0  .  modafinil (PROVIGIL) 100 MG tablet TAKE 1 TABLET BY MOUTH THREE TIMES DAILY 90 tablet 3  . mometasone (ELOCON) 0.1 % cream Apply 1 application topically daily. 45 g 0  . potassium chloride SA (K-DUR,KLOR-CON) 20 MEQ tablet TAKE 2 TABLETS BY MOUTH EVERY DAY 180 tablet 2  . rosuvastatin (CRESTOR) 10 MG tablet TAKE 1 TABLET BY MOUTH EVERY DAY 30 tablet 1  . Vitamin D, Ergocalciferol, (DRISDOL) 1.25 MG (50000 UT) CAPS capsule TAKE 1 CAPSULE BY MOUTH EVERY 7 DAYS 4 capsule 0   No current facility-administered medications on file prior to visit.     PAST MEDICAL HISTORY: Past Medical History:  Diagnosis Date  . Acute renal failure (ARF) ([place]) 11/2012    multifactorial-likely secondary to ATN in the setting of sepsis and hypotension, and also from rhabdomyolysis  . Anemia   . Cellulitis   . Chronic diastolic CHF (congestive heart failure) ([place])   . Depression   . Diabetes mellitus ([place])   . GERD (gastroesophageal reflux disease)   . Hyperlipidemia   . Hypertension   . Hypothyroidism   . Morbid obesity with BMI of 70 and over, adult ([place])   . Obesity hypoventilation syndrome ([place])   . OSA (obstructive sleep apnea)   . PVC's (premature ventricular contractions)   . Recurrent cellulitis of lower leg    LLE, venous insuff  . Sepsis ([place]) 11/2012   Secondary to cellulitis  . Sleep apnea   . Venous insufficiency of leg     PAST SURGICAL HISTORY: Past Surgical History:  Procedure Laterality Date  . IR FLUORO GUIDE CV MIDLINE PICC RIGHT  03/28/2017  . IR US GUIDE VASC ACCESS RIGHT  03/28/2017  . WISDOM TOOTH EXTRACTION      SOCIAL HISTORY: Social History   Tobacco Use  . Smoking status: Former Smoker    Years: 0.00  . Smokeless tobacco: Never Used  Substance Use Topics  . Alcohol use: No    Alcohol/week: 0.0 standard drinks  . Drug use: No    FAMILY HISTORY: Family History  Problem Relation Age of Onset  . Hypertension Mother   . Diabetes Mother   . High Cholesterol Mother    . Thyroid disease Mother   . Sleep apnea Mother   . Obesity Mother   . Hypertension Father   . Heart disease Father        before age 15  . High Cholesterol Father   . Sleep apnea Father   . Obesity Father     ROS: Review of Systems  Constitutional: Positive for malaise/fatigue. Negative for weight loss.  Cardiovascular: Negative for chest pain and orthopnea.       Negative chest pressure  Gastrointestinal: Negative for nausea and vomiting.  Musculoskeletal:       Negative muscle weakness  Neurological: Negative for headaches.    PHYSICAL EXAM: Blood pressure 138/70, pulse 65, temperature 98.6 F (37 C), temperature source Oral, height 5\' 2"  (1.575 m), weight (!) 393 lb (178.3  kg), SpO2 92 %. Body mass index is 71.88 kg/m. Physical Exam Vitals signs reviewed.  Constitutional:      Appearance: Normal appearance. She is obese.  Cardiovascular:     Rate and Rhythm: Normal rate.     Pulses: Normal pulses.  Pulmonary:     Effort: Pulmonary effort is normal.     Breath sounds: Normal breath sounds.  Musculoskeletal: Normal range of motion.  Skin:    General: Skin is warm and dry.  Neurological:     Mental Status: She is alert and oriented to person, place, and time.  Psychiatric:        Mood and Affect: Mood normal.        Behavior: Behavior normal.     RECENT LABS AND TESTS: BMET    Component Value Date/Time   NA 140 12/04/2018 0812   K 4.7 12/04/2018 0812   CL 100 12/04/2018 0812   CO2 24 12/04/2018 0812   GLUCOSE 96 12/04/2018 0812   GLUCOSE 117 (H) 03/23/2018 1224   BUN 23 (H) 12/04/2018 0812   CREATININE 0.79 12/04/2018 0812   CREATININE 1.06 11/14/2016 1050   CALCIUM 9.4 12/04/2018 0812   CALCIUM 9.6 06/26/2007 0000   GFRNONAA 95 12/04/2018 0812   GFRAA 110 12/04/2018 0812   Lab Results  Component Value Date   HGBA1C 5.4 12/04/2018   HGBA1C 5.8 (H) 07/25/2018   HGBA1C 6.5 03/23/2018   HGBA1C 6.6 (H) 03/28/2017   HGBA1C 6.9 (H) 03/02/2016    Lab Results  Component Value Date   INSULIN 25.8 (H) 12/04/2018   INSULIN 24.7 07/25/2018   INSULIN 25.0 (H) 04/17/2018   CBC    Component Value Date/Time   WBC 5.9 07/25/2018 1234   WBC 7.9 03/23/2018 1224   RBC 5.07 07/25/2018 1234   RBC 5.65 (H) 03/23/2018 1224   HGB 14.8 07/25/2018 1234   HCT 44.8 07/25/2018 1234   PLT 230.0 03/23/2018 1224   MCV 88 07/25/2018 1234   MCH 29.2 07/25/2018 1234   MCH 26.8 04/19/2017 0419   MCHC 33.0 07/25/2018 1234   MCHC 31.6 03/23/2018 1224   RDW 15.6 (H) 07/25/2018 1234   LYMPHSABS 1.4 07/25/2018 1234   MONOABS 0.7 04/16/2017 2351   EOSABS 0.2 07/25/2018 1234   BASOSABS 0.0 07/25/2018 1234   Iron/TIBC/Ferritin/ %Sat No results found for: IRON, TIBC, FERRITIN, IRONPCTSAT Lipid Panel     Component Value Date/Time   CHOL 170 12/04/2018 0812   TRIG 174 (H) 12/04/2018 0812   HDL 43 12/04/2018 0812   CHOLHDL 4 03/23/2018 1224   VLDL 29.6 03/23/2018 1224   LDLCALC 92 12/04/2018 0812   LDLDIRECT 141.0 03/02/2016 1101   Hepatic Function Panel     Component Value Date/Time   PROT 6.6 12/04/2018 0812   ALBUMIN 4.0 12/04/2018 0812   AST 17 12/04/2018 0812   ALT 21 12/04/2018 0812   ALKPHOS 67 12/04/2018 0812   BILITOT 0.4 12/04/2018 0812   BILIDIR 0.1 08/30/2016 1413   IBILI 0.5 12/25/2012 0223      Component Value Date/Time   TSH 1.600 04/17/2018 1239   TSH 3.96 03/23/2018 1224   TSH 1.310 12/16/2016 1438      OBESITY BEHAVIORAL INTERVENTION VISIT  Today's visit was # 18   Starting weight: 468 lbs Starting date: 04/17/18 Today's weight : 393 lbs Today's date: 02/12/2019 Total lbs lost to date: 75    02/12/2019  Height 5\' 2"  (1.575 m)  Weight 393 lb (178.3 kg) (A)  BMI (Calculated) 71.86  BLOOD PRESSURE - SYSTOLIC 330  BLOOD PRESSURE - DIASTOLIC 70   Body Fat % 07.6 %     ASK: We discussed the diagnosis of obesity with Duwayne Heck today and [place] agreed to give [place] permission to discuss obesity behavioral  modification therapy today.  ASSESS: Chrystal has the diagnosis of obesity and her BMI today is 71.86 Naraly is in the action stage of change   ADVISE: Jaylynn was educated on the multiple health risks of obesity as well as the benefit of weight loss to improve her health. She was advised of the need for long term treatment and the importance of lifestyle modifications to improve her current health and to decrease her risk of future health problems.  AGREE: Multiple dietary modification options and treatment options were discussed and  Hopie agreed to follow the recommendations documented in the above note.  ARRANGE: Younique was educated on the importance of frequent visits to treat obesity as outlined per CMS and USPSTF guidelines and agreed to schedule her next follow up appointment today.  I, Trixie Dredge, am acting as transcriptionist for [name] Qua, MD  I have reviewed the above documentation for accuracy and completeness, and I agree with the above. - [name] Qua, MD

## 2019-02-14 ENCOUNTER — Other Ambulatory Visit: Payer: Self-pay | Admitting: *Deleted

## 2019-02-14 DIAGNOSIS — E1169 Type 2 diabetes mellitus with other specified complication: Secondary | ICD-10-CM

## 2019-02-14 DIAGNOSIS — E039 Hypothyroidism, unspecified: Secondary | ICD-10-CM

## 2019-02-14 DIAGNOSIS — E669 Obesity, unspecified: Secondary | ICD-10-CM

## 2019-02-14 MED ORDER — METFORMIN HCL ER 500 MG PO TB24: ORAL_TABLET | ORAL | 0 refills | Status: DC

## 2019-02-14 MED ORDER — METOPROLOL SUCCINATE ER 50 MG PO TB24: ORAL_TABLET | ORAL | 0 refills | Status: DC

## 2019-02-14 MED ORDER — LEVOTHYROXINE SODIUM 150 MCG PO TABS: 150.0000 ug | ORAL_TABLET | Freq: Every day | ORAL | 0 refills | Status: DC

## 2019-02-14 MED ORDER — ROSUVASTATIN CALCIUM 10 MG PO TABS: 10.0000 mg | ORAL_TABLET | Freq: Every day | ORAL | 0 refills | Status: DC

## 2019-02-19 ENCOUNTER — Encounter (INDEPENDENT_AMBULATORY_CARE_PROVIDER_SITE_OTHER): Payer: Self-pay

## 2019-02-21 ENCOUNTER — Encounter (INDEPENDENT_AMBULATORY_CARE_PROVIDER_SITE_OTHER): Payer: Self-pay

## 2019-02-22 ENCOUNTER — Ambulatory Visit: Payer: BC Managed Care – PPO | Admitting: Pulmonary Disease

## 2019-02-28 ENCOUNTER — Ambulatory Visit (INDEPENDENT_AMBULATORY_CARE_PROVIDER_SITE_OTHER): Payer: BC Managed Care – PPO | Admitting: Family Medicine

## 2019-03-03 ENCOUNTER — Other Ambulatory Visit: Payer: Self-pay | Admitting: Cardiology

## 2019-03-06 ENCOUNTER — Encounter (INDEPENDENT_AMBULATORY_CARE_PROVIDER_SITE_OTHER): Payer: Self-pay

## 2019-03-06 ENCOUNTER — Encounter (INDEPENDENT_AMBULATORY_CARE_PROVIDER_SITE_OTHER): Payer: Self-pay | Admitting: Family Medicine

## 2019-03-28 ENCOUNTER — Encounter (INDEPENDENT_AMBULATORY_CARE_PROVIDER_SITE_OTHER): Payer: Self-pay

## 2019-04-18 ENCOUNTER — Other Ambulatory Visit (INDEPENDENT_AMBULATORY_CARE_PROVIDER_SITE_OTHER): Payer: Self-pay | Admitting: Family Medicine

## 2019-04-18 DIAGNOSIS — E559 Vitamin D deficiency, unspecified: Secondary | ICD-10-CM

## 2019-05-07 ENCOUNTER — Ambulatory Visit: Payer: BC Managed Care – PPO | Admitting: Internal Medicine

## 2019-05-28 ENCOUNTER — Other Ambulatory Visit: Payer: Self-pay | Admitting: Pulmonary Disease

## 2019-05-30 ENCOUNTER — Other Ambulatory Visit: Payer: Self-pay | Admitting: Pulmonary Disease

## 2019-05-30 MED ORDER — MODAFINIL 100 MG PO TABS: 100.0000 mg | ORAL_TABLET | Freq: Three times a day (TID) | ORAL | 0 refills | Status: DC

## 2019-05-30 NOTE — Telephone Encounter (Signed)
Attempted to call pt to let her know the Rx for modafinil was sent to pharmacy but unable to reach her. Left detailed message for pt letting her know this had been done.nothing further needed.

## 2019-05-30 NOTE — Telephone Encounter (Signed)
Sending back to [facility] for review.

## 2019-05-30 NOTE — Telephone Encounter (Signed)
Please send as med request. Thanks.  

## 2019-05-30 NOTE — Telephone Encounter (Signed)
Reviewed patient's chart, it appears that the RX was refilled on 6/30 and printed. [facility] and spoke with Linna Hoff. He stated that they have not received a RX for the Modafinil since February.   Left message for patient to let her know that I will try to get a refill for her.   Last OV: 10/13/19 with RA, advised to follow up in March 2020. She had an appt on March 24 but it was canceled due to [place]. It has not been rescheduled yet.   Next OV: N/A  Last RX: 01/14/19 for 90 tablets with 3 refills  TN, are you ok with refilling her Modafinil since RA is not here today? Please advise. Thanks!

## 2019-06-06 ENCOUNTER — Encounter: Payer: Self-pay | Admitting: Primary Care

## 2019-06-06 ENCOUNTER — Telehealth (INDEPENDENT_AMBULATORY_CARE_PROVIDER_SITE_OTHER): Payer: BC Managed Care – PPO | Admitting: Primary Care

## 2019-06-06 DIAGNOSIS — G4733 Obstructive sleep apnea (adult) (pediatric): Secondary | ICD-10-CM

## 2019-06-06 DIAGNOSIS — G4721 Circadian rhythm sleep disorder, delayed sleep phase type: Secondary | ICD-10-CM | POA: Diagnosis not present

## 2019-06-06 DIAGNOSIS — Z9989 Dependence on other enabling machines and devices: Secondary | ICD-10-CM | POA: Diagnosis not present

## 2019-06-06 DIAGNOSIS — G47419 Narcolepsy without cataplexy: Secondary | ICD-10-CM

## 2019-06-06 NOTE — Patient Instructions (Addendum)
  OSA - Continue CPAP every night for 4-6 hours or more - Large amount of air leaks, encourage patient replace mask/supplies more regularly - Do not drive if experiencing excessive daytime fatigue or somnolence  Narcolepsy - Continue Provigil as prescribed  Delayed sleep phase - Opportunity to try earlier bedtime d/t new work schedule - Going to sleep at 11:30pm for 1 week (wind down at 10pm and limit screen time) - Recommend you get 8 hours a night of sleep  - Get light exposure in the morning   Follow-up: - 6 months with Dr. Elsworth Soho

## 2019-06-06 NOTE — Progress Notes (Addendum)
Virtual Visit via Video Note  I connected with Alexandra Beck on 06/06/19 at 11:00 AM EDT by a video enabled telemedicine application and verified that I am speaking with the correct person using two identifiers.  Location: Patient: Home Provider: Office   I discussed the limitations of evaluation and management by telemedicine and the availability of in person appointments. The patient expressed understanding and agreed to proceed.  History of Present Illness: 38 year old female, former smoker. PMH significant for OSA, obesity hypoventilation syndrome, narcolepsy without cataplexy, delayed sleep phase, chronic diastolic heart failure. Patient of Dr. Elsworth Soho, last seen on 10/12/18. NPSG 2009: AHI 93/hr. CPAP titration 05/2018 >> CPAP 15 + 3L O2 (434 lbs ). Emphasized importance of sleep routine and scheduled bed/wake time. Enc bedtime around 1-2 AM. Recommended light exposure in the morning. Maintained on Provigil 100mg  three times daily. Continues CPAP at 15cm H20.   06/06/2019 Patient contacted today for video visit for OSA follow-up. She is doing well. Compliant with CPAP, wearing every night. No issues with pressure setting, does lose mask seal. States that she is not the best at replacing CPAP parts. Reports benefit from Provigil medication.  Typically works 1pm-10pm but since [place] pandemic she has been working 10am-7pm. She still goes to bed around 1:30-2am and wakes up around 9-9:30am. She still has a hard time getting up in the moring but medication allows her to stay awake. Takes 300mg  at once in the morning. She can feel a difference if she forgets to take, states that she is ready to fall asleep. [facility]- has an apt next Thursday.   Airview download: 30/30 day used; 93% >4 hours Average usage 6 hours 46 mins Pressure 15cm h20 Leaks 79.9 L/min (95%) AHI 6.1  Observations/Objective:  No shortness of breath, wheezing or cough observed during video  visit  Assessment and Plan:  OSA - Patient is 100% compliant with use - Pressure 15cm h20; AHI 6.1 - Large amount of air leaks, encourage patient replace mask/supplies more regularly - Continue CPAP every night for 4-6 hours or more - Do not drive if experiencing excessive daytime fatigue or somnolence  Narcolepsy - Continue Provigil as prescribed; medication helps keep her awake during the day  Delayed sleep phase - Opportunity for patient to try earlier bedtime d/t new work schedule  - Advised 8 hours of sleep  - Recommended light exposure in the morning  Follow Up Instructions:  - FU 6 months with Dr. Elsworth Soho    I discussed the assessment and treatment plan with the patient. The patient was provided an opportunity to ask questions and all were answered. The patient agreed with the plan and demonstrated an understanding of the instructions.   The patient was advised to call back or seek an in-person evaluation if the symptoms worsen or if the condition fails to improve as anticipated.  I provided 20 minutes of non-face-to-face time during this encounter.   Martyn Ehrich, NP

## 2019-06-10 NOTE — Telephone Encounter (Signed)
Per patient's chart, she was indeed seen on 7.9.2020 by [facility] NP with note completed.  E-mail sent to patient apologizing and informing her will see how this can be remedied.

## 2019-06-13 ENCOUNTER — Telehealth (INDEPENDENT_AMBULATORY_CARE_PROVIDER_SITE_OTHER): Payer: BC Managed Care – PPO | Admitting: Family Medicine

## 2019-06-13 ENCOUNTER — Other Ambulatory Visit: Payer: Self-pay

## 2019-06-13 ENCOUNTER — Encounter (INDEPENDENT_AMBULATORY_CARE_PROVIDER_SITE_OTHER): Payer: Self-pay | Admitting: Family Medicine

## 2019-06-13 DIAGNOSIS — F329 Major depressive disorder, single episode, unspecified: Secondary | ICD-10-CM

## 2019-06-13 DIAGNOSIS — F419 Anxiety disorder, unspecified: Secondary | ICD-10-CM

## 2019-06-13 DIAGNOSIS — E559 Vitamin D deficiency, unspecified: Secondary | ICD-10-CM

## 2019-06-13 DIAGNOSIS — Z6841 Body Mass Index (BMI) 40.0 and over, adult: Secondary | ICD-10-CM

## 2019-06-13 MED ORDER — SERTRALINE HCL 25 MG PO TABS: 25.0000 mg | ORAL_TABLET | Freq: Every day | ORAL | 0 refills | Status: DC

## 2019-06-13 MED ORDER — VITAMIN D (ERGOCALCIFEROL) 1.25 MG (50000 UNIT) PO CAPS: ORAL_CAPSULE | ORAL | 0 refills | Status: DC

## 2019-06-18 NOTE — Progress Notes (Signed)
Office: [phone]  /  Fax: [phone] TeleHealth Visit:  Alexandra Beck has verbally consented to this TeleHealth visit today. The patient is located at home, the provider is located at the [facility] office. The participants in this visit include the listed provider and patient and any and all parties involved. The visit was conducted today via WebEx.  HPI:   Chief Complaint: OBESITY Alexandra Beck is here to discuss her progress with her obesity treatment plan. She is on the Category 4 plan and is following her eating plan approximately 50 % of the time. She states she is exercising 0 minutes 0 times per week. This was Alexandra Beck's first appointment in months, and she was very emotional throughout the appointment. Alexandra Beck has been staying home and working and caring for her cat. She has been struggling emotionally, and she is doing quite a bit of emotional eating or not eating.  We were unable to weigh the patient today for this TeleHealth visit. She feels as if she has gained weight since her last visit. She has lost 75 lbs since starting treatment with [place].  Vitamin D deficiency Alexandra Beck has a diagnosis of vitamin D deficiency. She is currently taking vit D. Alexandra Beck admits fatigue and she denies nausea, vomiting or muscle weakness.  Depression with anxiety Alexandra Beck has significant symptoms of depression, especially with working from home and social isolation. She shows no sign of suicidal or homicidal ideations.  ASSESSMENT AND PLAN:  Anxiety and depression  Vitamin D deficiency - Plan: Vitamin D, Ergocalciferol, (DRISDOL) 1.25 MG (50000 UT) CAPS capsule  Class 3 severe obesity with serious comorbidity and body mass index (BMI) greater than or equal to 70 in adult, unspecified obesity type (HCC)  PLAN:  Vitamin D Deficiency Alexandra Beck was informed that low vitamin D levels contributes to fatigue and are associated with obesity, breast, and colon cancer. She agrees to continue to take  prescription Vit D @50 ,000 IU every week #4 with no refills and will follow up for routine testing of vitamin D, at least 2-3 times per year. She was informed of the risk of over-replacement of vitamin D and agrees to not increase her dose unless she discusses this with [place] first. Alexandra Beck agrees to follow up with our clinic in 2 weeks.  Depression with anxiety We discussed behavior modification techniques today to help Alexandra Beck deal with her emotional eating and depression. She has agreed to start Zoloft 25 mg once daily #30 with no refills and follow up as directed.  Obesity Alexandra Beck is currently in the action stage of change. As such, her goal is to continue with weight loss efforts She has agreed to follow the Category 4 plan Alexandra Beck has been instructed to work up to a goal of 150 minutes of combined cardio and strengthening exercise per week for weight loss and overall health benefits. We discussed the following Behavioral Modification Strategies today: planning for success, keeping healthy foods in the home, increasing lean protein intake, increasing vegetables and work on meal planning and easy cooking plans  Alexandra Beck has agreed to follow up with our clinic in 2 weeks. She was informed of the importance of frequent follow up visits to maximize her success with intensive lifestyle modifications for her multiple health conditions.  ALLERGIES: Allergies  Allergen Reactions  . Aspirin Hives and Swelling    REACTION: throat swelling, hives  . Doxycycline Other (See Comments)    Abdominal pain  . Lisinopril Cough  . Niaspan [Niacin Er]  Caused flushing    MEDICATIONS: Current Outpatient Medications on File Prior to Visit  Medication Sig Dispense Refill  . amoxicillin (AMOXIL) 500 MG capsule TAKE 1 CAPSULE(500 MG) BY MOUTH TWICE DAILY 60 capsule 3  . furosemide (LASIX) 80 MG tablet Take 1 tablet (80 mg total) by mouth daily. 90 tablet 3  . levothyroxine (SYNTHROID, LEVOTHROID) 150 MCG tablet Take  1 tablet (150 mcg total) by mouth daily before breakfast. 90 tablet 0  . losartan (COZAAR) 50 MG tablet TAKE 1 TABLET(50 MG) BY MOUTH DAILY 90 tablet 0  . metFORMIN (GLUCOPHAGE-XR) 500 MG 24 hr tablet TAKE 1 TABLET(500 MG) BY MOUTH DAILY WITH BREAKFAST 90 tablet 0  . metoprolol succinate (TOPROL-XL) 50 MG 24 hr tablet Take with or immediately following a meal. 90 tablet 0  . modafinil (PROVIGIL) 100 MG tablet Take 1 tablet (100 mg total) by mouth 3 (three) times daily. 90 tablet 0  . potassium chloride SA (K-DUR,KLOR-CON) 20 MEQ tablet TAKE 2 TABLETS BY MOUTH EVERY DAY 180 tablet 2  . rosuvastatin (CRESTOR) 10 MG tablet Take 1 tablet (10 mg total) by mouth daily. 90 tablet 0  . mometasone (ELOCON) 0.1 % cream Apply 1 application topically daily. (Patient not taking: Reported on 06/13/2019) 45 g 0   No current facility-administered medications on file prior to visit.     PAST MEDICAL HISTORY: Past Medical History:  Diagnosis Date  . Acute renal failure (ARF) ([place]) 11/2012    multifactorial-likely secondary to ATN in the setting of sepsis and hypotension, and also from rhabdomyolysis  . Anemia   . Cellulitis   . Chronic diastolic CHF (congestive heart failure) ([place])   . Depression   . Diabetes mellitus ([place])   . GERD (gastroesophageal reflux disease)   . Hyperlipidemia   . Hypertension   . Hypothyroidism   . Morbid obesity with BMI of 70 and over, adult ([place])   . Obesity hypoventilation syndrome ([place])   . OSA (obstructive sleep apnea)   . PVC's (premature ventricular contractions)   . Recurrent cellulitis of lower leg    LLE, venous insuff  . Sepsis ([place]) 11/2012   Secondary to cellulitis  . Sleep apnea   . Venous insufficiency of leg     PAST SURGICAL HISTORY: Past Surgical History:  Procedure Laterality Date  . IR FLUORO GUIDE CV MIDLINE PICC RIGHT  03/28/2017  . IR US GUIDE VASC ACCESS RIGHT  03/28/2017  . WISDOM TOOTH EXTRACTION      SOCIAL HISTORY: Social History    Tobacco Use  . Smoking status: Former Smoker    Years: 0.00  . Smokeless tobacco: Never Used  Substance Use Topics  . Alcohol use: No    Alcohol/week: 0.0 standard drinks  . Drug use: No    FAMILY HISTORY: Family History  Problem Relation Age of Onset  . Hypertension Mother   . Diabetes Mother   . High Cholesterol Mother   . Thyroid disease Mother   . Sleep apnea Mother   . Obesity Mother   . Hypertension Father   . Heart disease Father        before age 37  . High Cholesterol Father   . Sleep apnea Father   . Obesity Father     ROS: Review of Systems  Constitutional: Positive for malaise/fatigue. Negative for weight loss.  Gastrointestinal: Negative for nausea and vomiting.  Musculoskeletal:       Negative for muscle weakness  Psychiatric/Behavioral: Positive for depression. Negative for  suicidal ideas. The patient is nervous/anxious.     PHYSICAL EXAM: Pt in no acute distress  RECENT LABS AND TESTS: BMET    Component Value Date/Time   NA 140 12/04/2018 0812   K 4.7 12/04/2018 0812   CL 100 12/04/2018 0812   CO2 24 12/04/2018 0812   GLUCOSE 96 12/04/2018 0812   GLUCOSE 117 (H) 03/23/2018 1224   BUN 23 (H) 12/04/2018 0812   CREATININE 0.79 12/04/2018 0812   CREATININE 1.06 11/14/2016 1050   CALCIUM 9.4 12/04/2018 0812   CALCIUM 9.6 06/26/2007 0000   GFRNONAA 95 12/04/2018 0812   GFRAA 110 12/04/2018 0812   Lab Results  Component Value Date   HGBA1C 5.4 12/04/2018   HGBA1C 5.8 (H) 07/25/2018   HGBA1C 6.5 03/23/2018   HGBA1C 6.6 (H) 03/28/2017   HGBA1C 6.9 (H) 03/02/2016   Lab Results  Component Value Date   INSULIN 25.8 (H) 12/04/2018   INSULIN 24.7 07/25/2018   INSULIN 25.0 (H) 04/17/2018   CBC    Component Value Date/Time   WBC 5.9 07/25/2018 1234   WBC 7.9 03/23/2018 1224   RBC 5.07 07/25/2018 1234   RBC 5.65 (H) 03/23/2018 1224   HGB 14.8 07/25/2018 1234   HCT 44.8 07/25/2018 1234   PLT 230.0 03/23/2018 1224   MCV 88 07/25/2018 1234    MCH 29.2 07/25/2018 1234   MCH 26.8 04/19/2017 0419   MCHC 33.0 07/25/2018 1234   MCHC 31.6 03/23/2018 1224   RDW 15.6 (H) 07/25/2018 1234   LYMPHSABS 1.4 07/25/2018 1234   MONOABS 0.7 04/16/2017 2351   EOSABS 0.2 07/25/2018 1234   BASOSABS 0.0 07/25/2018 1234   Iron/TIBC/Ferritin/ %Sat No results found for: IRON, TIBC, FERRITIN, IRONPCTSAT Lipid Panel     Component Value Date/Time   CHOL 170 12/04/2018 0812   TRIG 174 (H) 12/04/2018 0812   HDL 43 12/04/2018 0812   CHOLHDL 4 03/23/2018 1224   VLDL 29.6 03/23/2018 1224   LDLCALC 92 12/04/2018 0812   LDLDIRECT 141.0 03/02/2016 1101   Hepatic Function Panel     Component Value Date/Time   PROT 6.6 12/04/2018 0812   ALBUMIN 4.0 12/04/2018 0812   AST 17 12/04/2018 0812   ALT 21 12/04/2018 0812   ALKPHOS 67 12/04/2018 0812   BILITOT 0.4 12/04/2018 0812   BILIDIR 0.1 08/30/2016 1413   IBILI 0.5 12/25/2012 0223      Component Value Date/Time   TSH 1.600 04/17/2018 1239   TSH 3.96 03/23/2018 1224   TSH 1.310 12/16/2016 1438     Ref. Range 12/04/2018 08:12  Vitamin D, 25-Hydroxy Latest Ref Range: 30.0 - 100.0 ng/mL 37.4    I, Doreene Nest, am acting as Location manager for Eber Jones, MD   I have reviewed the above documentation for accuracy and completeness, and I agree with the above. - [name] Qua, MD

## 2019-06-25 ENCOUNTER — Other Ambulatory Visit: Payer: Self-pay | Admitting: Internal Medicine

## 2019-06-25 ENCOUNTER — Other Ambulatory Visit: Payer: Self-pay | Admitting: Cardiology

## 2019-06-25 ENCOUNTER — Other Ambulatory Visit: Payer: Self-pay | Admitting: Nurse Practitioner

## 2019-06-25 DIAGNOSIS — L03119 Cellulitis of unspecified part of limb: Secondary | ICD-10-CM

## 2019-06-26 ENCOUNTER — Telehealth: Payer: Self-pay | Admitting: Pulmonary Disease

## 2019-06-26 ENCOUNTER — Other Ambulatory Visit: Payer: Self-pay | Admitting: *Deleted

## 2019-06-26 ENCOUNTER — Other Ambulatory Visit: Payer: Self-pay | Admitting: Pulmonary Disease

## 2019-06-26 MED ORDER — ROSUVASTATIN CALCIUM 10 MG PO TABS: 10.0000 mg | ORAL_TABLET | Freq: Every day | ORAL | 0 refills | Status: DC

## 2019-06-26 NOTE — Telephone Encounter (Signed)
[name], LPN to [place], [place]      06/26/19 4:40 PM Alexandra Beck,  Our records show [name], NP refilled your Provigil 06/25/19.  Prescription was sent to [facility].  If you have any questions, please contact us.  Thank you  Attempted to call pt but unable to reach.left pt a detailed message letting her know that the Rx had been sent to pharmacy for her 7/28 for the provigil. nothing further needed.

## 2019-06-28 ENCOUNTER — Other Ambulatory Visit: Payer: Self-pay | Admitting: Nurse Practitioner

## 2019-07-04 ENCOUNTER — Telehealth (INDEPENDENT_AMBULATORY_CARE_PROVIDER_SITE_OTHER): Payer: BC Managed Care – PPO | Admitting: Family Medicine

## 2019-07-04 ENCOUNTER — Ambulatory Visit (INDEPENDENT_AMBULATORY_CARE_PROVIDER_SITE_OTHER): Payer: BC Managed Care – PPO | Admitting: Physician Assistant

## 2019-07-04 ENCOUNTER — Other Ambulatory Visit: Payer: Self-pay

## 2019-07-04 ENCOUNTER — Encounter (INDEPENDENT_AMBULATORY_CARE_PROVIDER_SITE_OTHER): Payer: Self-pay | Admitting: Family Medicine

## 2019-07-04 VITALS — BP 154/86 | HR 63 | Temp 98.5°F | Ht 62.0 in | Wt >= 6400 oz

## 2019-07-04 DIAGNOSIS — E559 Vitamin D deficiency, unspecified: Secondary | ICD-10-CM | POA: Diagnosis not present

## 2019-07-04 DIAGNOSIS — F3289 Other specified depressive episodes: Secondary | ICD-10-CM

## 2019-07-04 DIAGNOSIS — Z9189 Other specified personal risk factors, not elsewhere classified: Secondary | ICD-10-CM

## 2019-07-04 DIAGNOSIS — R7303 Prediabetes: Secondary | ICD-10-CM

## 2019-07-04 DIAGNOSIS — Z6841 Body Mass Index (BMI) 40.0 and over, adult: Secondary | ICD-10-CM

## 2019-07-04 DIAGNOSIS — E038 Other specified hypothyroidism: Secondary | ICD-10-CM

## 2019-07-04 MED ORDER — SERTRALINE HCL 25 MG PO TABS: 25.0000 mg | ORAL_TABLET | Freq: Every day | ORAL | 0 refills | Status: DC

## 2019-07-04 MED ORDER — VITAMIN D (ERGOCALCIFEROL) 1.25 MG (50000 UNIT) PO CAPS: ORAL_CAPSULE | ORAL | 0 refills | Status: DC

## 2019-07-04 NOTE — Telephone Encounter (Signed)
Please advise 

## 2019-07-05 LAB — COMPREHENSIVE METABOLIC PANEL
ALT: 18 IU/L (ref 0–32)
AST: 20 IU/L (ref 0–40)
Albumin/Globulin Ratio: 1.4 (ref 1.2–2.2)
Albumin: 4.2 g/dL (ref 3.8–4.8)
Alkaline Phosphatase: 75 IU/L (ref 39–117)
BUN/Creatinine Ratio: 27 — ABNORMAL HIGH (ref 9–23)
BUN: 22 mg/dL — ABNORMAL HIGH (ref 6–20)
Bilirubin Total: 0.4 mg/dL (ref 0.0–1.2)
CO2: 24 mmol/L (ref 20–29)
Calcium: 9.8 mg/dL (ref 8.7–10.2)
Chloride: 99 mmol/L (ref 96–106)
Creatinine, Ser: 0.83 mg/dL (ref 0.57–1.00)
GFR calc Af Amer: 103 mL/min/{1.73_m2} (ref >59)
GFR calc non Af Amer: 89 mL/min/{1.73_m2} (ref >59)
Globulin, Total: 3.1 g/dL (ref 1.5–4.5)
Glucose: 89 mg/dL (ref 65–99)
Potassium: 4.8 mmol/L (ref 3.5–5.2)
Sodium: 139 mmol/L (ref 134–144)
Total Protein: 7.3 g/dL (ref 6.0–8.5)

## 2019-07-05 LAB — T3: T3, Total: 116 ng/dL (ref 71–180)

## 2019-07-05 LAB — T4, FREE: Free T4: 1.56 ng/dL (ref 0.82–1.77)

## 2019-07-05 LAB — HEMOGLOBIN A1C
Est. average glucose Bld gHb Est-mCnc: 114 mg/dL
Hgb A1c MFr Bld: 5.6 % (ref 4.8–5.6)

## 2019-07-05 LAB — INSULIN, RANDOM: INSULIN: 23.5 u[IU]/mL (ref 2.6–24.9)

## 2019-07-05 LAB — TSH: TSH: 1.98 u[IU]/mL (ref 0.450–4.500)

## 2019-07-05 LAB — VITAMIN D 25 HYDROXY (VIT D DEFICIENCY, FRACTURES): Vit D, 25-Hydroxy: 34.6 ng/mL (ref 30.0–100.0)

## 2019-07-08 ENCOUNTER — Other Ambulatory Visit (INDEPENDENT_AMBULATORY_CARE_PROVIDER_SITE_OTHER): Payer: Self-pay

## 2019-07-08 DIAGNOSIS — E1169 Type 2 diabetes mellitus with other specified complication: Secondary | ICD-10-CM

## 2019-07-08 MED ORDER — METFORMIN HCL ER 500 MG PO TB24: ORAL_TABLET | ORAL | 0 refills | Status: DC

## 2019-07-08 NOTE — Progress Notes (Signed)
Office: [phone]  /  Fax: [phone]   HPI:   Chief Complaint: OBESITY Alexandra Beck is here to discuss her progress with her obesity treatment plan. She is on the Category 4 plan and is following her eating plan approximately 40% of the time. She states she is exercising 0 minutes 0 times per week. Alexandra Beck reports struggling to follow the plan due to lack of appetite since working from home. She was recently started on Zoloft for her mood, which she says is not yet effective. She is snacking on foods that are triggering her.  Her weight is (!) 411 lb (186.4 kg) today and has had a weight gain of 18 lbs since her last visit. She has lost 57 lbs since starting treatment with [place].  Vitamin D deficiency Alexandra Beck has a diagnosis of Vitamin D deficiency. She is currently taking prescription Vit D and denies nausea, vomiting or muscle weakness.  At risk for osteopenia and osteoporosis Alexandra Beck is at higher risk of osteopenia and osteoporosis due to Vitamin D deficiency.   Hypothyroidism Alexandra Beck has a diagnosis of hypothyroidism. She is on levothyroxine. She denies heat or cold intolerance.  Depression with emotional eating behaviors Alexandra Beck is struggling with emotional eating and using food for comfort to the extent that it is negatively impacting her health. She often snacks when she is not hungry. Alexandra Beck sometimes feels she is out of control and then feels guilty that she made poor food choices. She has been working on behavior modification techniques to help reduce her emotional eating and has been somewhat successful. Alexandra Beck is on Zoloft and shows no sign of suicidal or homicidal ideations. She reports no difference in her mood but has only been taking it for 3 weeks. She states she has had a headache almost daily since starting Zoloft.  Depression screen Alexandra Beck 2/9 04/17/2018 02/27/2018 10/11/2017  Decreased Interest 3 0 0  Down, Depressed, Hopeless 3 0 0  PHQ - 2 Score 6 0 0  Altered sleeping 3 - -  Tired,  decreased energy 3 - -  Change in appetite 2 - -  Feeling bad or failure about yourself  3 - -  Trouble concentrating 1 - -  Moving slowly or fidgety/restless 3 - -  Suicidal thoughts 2 - -  PHQ-9 Score 23 - -  Difficult doing work/chores Very difficult - -   Pre-Diabetes Alexandra Beck has a diagnosis of prediabetes based on her elevated Hgb A1c and was informed this puts her at greater risk of developing diabetes. She is taking metformin currently and continues to work on diet and exercise to decrease risk of diabetes. She denies nausea, vomiting, diarrhea, or polyphagia.  ASSESSMENT AND PLAN:  Vitamin D deficiency - Plan: VITAMIN D 25 Hydroxy (Vit-D Deficiency, Fractures), Vitamin D, Ergocalciferol, (DRISDOL) 1.25 MG (50000 UT) CAPS capsule  Other specified hypothyroidism - Plan: T3, T4, free, TSH  Other depression  Prediabetes - Plan: Hemoglobin A1c, Insulin, random, Comprehensive metabolic panel  At risk for osteoporosis  Class 3 severe obesity with serious comorbidity and body mass index (BMI) greater than or equal to 70 in adult, unspecified obesity type (HCC)  PLAN:  Vitamin D Deficiency Alexandra Beck was informed that low Vitamin D levels contributes to fatigue and are associated with obesity, breast, and colon cancer. She agrees to continue to take prescription Vit D @ 50,000 IU every week #4 with 0 refills and will have routine testing of Vitamin D. She was informed of the risk of over-replacement of Vitamin  D and agrees to not increase her dose unless she discusses this with [place] first. Alexandra Beck agrees to follow-up with our clinic in 2 weeks.  At risk for osteopenia and osteoporosis Alexandra Beck was given extended  (15 minutes) osteoporosis prevention counseling today. Alexandra Beck is at risk for osteopenia and osteoporosis due to her Vitamin D deficiency. She was encouraged to take her Vitamin D and follow her higher calcium diet and increase strengthening exercise to help strengthen her bones and decrease  her risk of osteopenia and osteoporosis.  Hypothyroidism Alexandra Beck was informed of the importance of good thyroid control to help with weight loss efforts. She was also informed that supertheraputic thyroid levels are dangerous and will not improve weight loss results. Alexandra Beck will continue levothyroxine and will have labs checked.  Depression with Emotional Eating Behaviors We discussed behavior modification techniques today to help Alexandra Beck deal with her emotional eating and depression. Alexandra Beck was given a refill on her Zoloft #30 with 0 refills. She agrees to follow-up with our clinic in 2 weeks.  Pre-Diabetes Alexandra Beck will continue to work on weight loss, exercise, and decreasing simple carbohydrates in her diet to help decrease the risk of diabetes. We dicussed metformin including benefits and risks. She was informed that eating too many simple carbohydrates or too many calories at one sitting increases the likelihood of GI side effects. Alexandra Beck will continue metformin, have labs checked, and follow-up with [place] as directed to monitor her progress.   Obesity Alexandra Beck is currently in the action stage of change. As such, her goal is to continue with weight loss efforts. She has agreed to follow the Category 4 plan and journal 550-700 calories + 45 grams of protein at supper. Alexandra Beck has been instructed to work up to a goal of 150 minutes of combined cardio and strengthening exercise per week for weight loss and overall health benefits. We discussed the following Behavioral Modification Strategies today: no skipping meals, work on meal planning and easy cooking plans, and keeping healthy foods in the home.  Alexandra Beck has agreed to follow-up with our clinic in 2 weeks. She was informed of the importance of frequent follow-up visits to maximize her success with intensive lifestyle modifications for her multiple health conditions.  ALLERGIES: Allergies  Allergen Reactions   Aspirin Hives and Swelling    REACTION:  throat swelling, hives   Doxycycline Other (See Comments)    Abdominal pain   Lisinopril Cough   Niaspan [Niacin Er]     Caused flushing    MEDICATIONS: Current Outpatient Medications on File Prior to Visit  Medication Sig Dispense Refill   amoxicillin (AMOXIL) 500 MG capsule TAKE 1 CAPSULE(500 MG) BY MOUTH TWICE DAILY 60 capsule 3   furosemide (LASIX) 80 MG tablet TAKE 1 TABLET(80 MG) BY MOUTH DAILY 90 tablet 0   levothyroxine (SYNTHROID, LEVOTHROID) 150 MCG tablet Take 1 tablet (150 mcg total) by mouth daily before breakfast. 90 tablet 0   losartan (COZAAR) 50 MG tablet Take 1 tablet (50 mg total) by mouth daily. 90 tablet 0   metFORMIN (GLUCOPHAGE-XR) 500 MG 24 hr tablet TAKE 1 TABLET(500 MG) BY MOUTH DAILY WITH BREAKFAST 90 tablet 0   metoprolol succinate (TOPROL-XL) 50 MG 24 hr tablet Take with or immediately following a meal. 90 tablet 0   modafinil (PROVIGIL) 100 MG tablet TAKE 1 TABLET(100 MG) BY MOUTH THREE TIMES DAILY 90 tablet 3   mometasone (ELOCON) 0.1 % cream Apply 1 application topically daily. (Patient not taking: Reported on 06/13/2019) 45 g  0   potassium chloride SA (K-DUR,KLOR-CON) 20 MEQ tablet TAKE 2 TABLETS BY MOUTH EVERY DAY 180 tablet 2   rosuvastatin (CRESTOR) 10 MG tablet Take 1 tablet (10 mg total) by mouth daily. 90 tablet 0   No current facility-administered medications on file prior to visit.     PAST MEDICAL HISTORY: Past Medical History:  Diagnosis Date   Acute renal failure (ARF) ([place]) 11/2012    multifactorial-likely secondary to ATN in the setting of sepsis and hypotension, and also from rhabdomyolysis   Anemia    Cellulitis    Chronic diastolic CHF (congestive heart failure) ([facility])    Depression    Diabetes mellitus ([facility])    GERD (gastroesophageal reflux disease)    Hyperlipidemia    Hypertension    Hypothyroidism    Morbid obesity with BMI of 70 and over, adult ([place])    Obesity hypoventilation syndrome (H[facility])    OSA  (obstructive sleep apnea)    PVC's (premature ventricular contractions)    Recurrent cellulitis of lower leg    LLE, venous insuff   Sepsis ([place]) 11/2012   Secondary to cellulitis   Sleep apnea    Venous insufficiency of leg     PAST SURGICAL HISTORY: Past Surgical History:  Procedure Laterality Date   IR FLUORO GUIDE CV MIDLINE PICC RIGHT  03/28/2017   IR US GUIDE VASC ACCESS RIGHT  03/28/2017   WISDOM TOOTH EXTRACTION      SOCIAL HISTORY: Social History   Tobacco Use   Smoking status: Former Smoker    Years: 0.00   Smokeless tobacco: Never Used  Substance Use Topics   Alcohol use: No    Alcohol/week: 0.0 standard drinks   Drug use: No    FAMILY HISTORY: Family History  Problem Relation Age of Onset   Hypertension Mother    Diabetes Mother    High Cholesterol Mother    Thyroid disease Mother    Sleep apnea Mother    Obesity Mother    Hypertension Father    Heart disease Father        before age 87   High Cholesterol Father    Sleep apnea Father    Obesity Father    ROS: Review of Systems  Gastrointestinal: Negative for diarrhea, nausea and vomiting.  Musculoskeletal:       Negative for muscle weakness.  Endo/Heme/Allergies:       Negative for heat/cold intolerance. Negative for polyphagia.  Psychiatric/Behavioral: Positive for depression (emotional eating). Negative for suicidal ideas.       Negative for homicidal ideas.   PHYSICAL EXAM: Blood pressure (!) 154/86, pulse 63, temperature 98.5 F (36.9 C), temperature source Oral, height 5\' 2"  (1.575 m), weight (!) 411 lb (186.4 kg), SpO2 95 %. Body mass index is 75.17 kg/m. Physical Exam Vitals signs reviewed.  Constitutional:      Appearance: Normal appearance. She is obese.  Cardiovascular:     Rate and Rhythm: Normal rate.     Pulses: Normal pulses.  Pulmonary:     Effort: Pulmonary effort is normal.     Breath sounds: Normal breath sounds.  Musculoskeletal: Normal range of  motion.  Skin:    General: Skin is warm and dry.  Neurological:     Mental Status: She is alert and oriented to person, place, and time.  Psychiatric:        Behavior: Behavior normal.   RECENT LABS AND TESTS: BMET    Component Value Date/Time   NA 139  07/04/2019 1411   K 4.8 07/04/2019 1411   CL 99 07/04/2019 1411   CO2 24 07/04/2019 1411   GLUCOSE 89 07/04/2019 1411   GLUCOSE 117 (H) 03/23/2018 1224   BUN 22 (H) 07/04/2019 1411   CREATININE 0.83 07/04/2019 1411   CREATININE 1.06 11/14/2016 1050   CALCIUM 9.8 07/04/2019 1411   CALCIUM 9.6 06/26/2007 0000   GFRNONAA 89 07/04/2019 1411   GFRAA 103 07/04/2019 1411   Lab Results  Component Value Date   HGBA1C 5.6 07/04/2019   HGBA1C 5.4 12/04/2018   HGBA1C 5.8 (H) 07/25/2018   HGBA1C 6.5 03/23/2018   HGBA1C 6.6 (H) 03/28/2017   Lab Results  Component Value Date   INSULIN 23.5 07/04/2019   INSULIN 25.8 (H) 12/04/2018   INSULIN 24.7 07/25/2018   INSULIN 25.0 (H) 04/17/2018   CBC    Component Value Date/Time   WBC 5.9 07/25/2018 1234   WBC 7.9 03/23/2018 1224   RBC 5.07 07/25/2018 1234   RBC 5.65 (H) 03/23/2018 1224   HGB 14.8 07/25/2018 1234   HCT 44.8 07/25/2018 1234   PLT 230.0 03/23/2018 1224   MCV 88 07/25/2018 1234   MCH 29.2 07/25/2018 1234   MCH 26.8 04/19/2017 0419   MCHC 33.0 07/25/2018 1234   MCHC 31.6 03/23/2018 1224   RDW 15.6 (H) 07/25/2018 1234   LYMPHSABS 1.4 07/25/2018 1234   MONOABS 0.7 04/16/2017 2351   EOSABS 0.2 07/25/2018 1234   BASOSABS 0.0 07/25/2018 1234   Iron/TIBC/Ferritin/ %Sat No results found for: IRON, TIBC, FERRITIN, IRONPCTSAT Lipid Panel     Component Value Date/Time   CHOL 170 12/04/2018 0812   TRIG 174 (H) 12/04/2018 0812   HDL 43 12/04/2018 0812   CHOLHDL 4 03/23/2018 1224   VLDL 29.6 03/23/2018 1224   LDLCALC 92 12/04/2018 0812   LDLDIRECT 141.0 03/02/2016 1101   Hepatic Function Panel     Component Value Date/Time   PROT 7.3 07/04/2019 1411   ALBUMIN 4.2  07/04/2019 1411   AST 20 07/04/2019 1411   ALT 18 07/04/2019 1411   ALKPHOS 75 07/04/2019 1411   BILITOT 0.4 07/04/2019 1411   BILIDIR 0.1 08/30/2016 1413   IBILI 0.5 12/25/2012 0223      Component Value Date/Time   TSH 1.980 07/04/2019 1411   TSH 1.600 04/17/2018 1239   TSH 3.96 03/23/2018 1224   Results for TOMISHA, REPPUCCI (MRN [id number]) as of 07/08/2019 08:51  Ref. Range 07/04/2019 14:11  Vitamin D, 25-Hydroxy Latest Ref Range: 30.0 - 100.0 ng/mL 34.6   OBESITY BEHAVIORAL INTERVENTION VISIT  Today's visit was #19  Starting weight: 468 lbs Starting date: 04/17/2018 Today's weight: 411 lbs  Today's date: 07/04/2019 Total lbs lost to date: 57    07/04/2019  Height 5\' 2"  (1.575 m)  Weight 411 lb (186.4 kg) (A)  BMI (Calculated) 75.15  BLOOD PRESSURE - SYSTOLIC 938  BLOOD PRESSURE - DIASTOLIC 86   Body Fat % 10.1 %   ASK: We discussed the diagnosis of obesity with Duwayne Heck today and Calley agreed to give [place] permission to discuss obesity behavioral modification therapy today.  ASSESS: Tessla has the diagnosis of obesity and her BMI today is 75.3. Deslyn is in the action stage of change.   ADVISE: Aayat was educated on the multiple health risks of obesity as well as the benefit of weight loss to improve her health. She was advised of the need for long term treatment and the importance of lifestyle modifications to  improve her current health and to decrease her risk of future health problems.  AGREE: Multiple dietary modification options and treatment options were discussed and  Milena agreed to follow the recommendations documented in the above note.  ARRANGE: Dmiya was educated on the importance of frequent visits to treat obesity as outlined per CMS and USPSTF guidelines and agreed to schedule her next follow up appointment today.  Migdalia Dk, am acting as transcriptionist for Abby Potash, Beck-C I, Abby Potash, Beck-C have reviewed above note and agree with its  content

## 2019-07-08 NOTE — Telephone Encounter (Signed)
Please refill her metformin. THanks

## 2019-07-08 NOTE — Telephone Encounter (Signed)
Done

## 2019-07-16 ENCOUNTER — Encounter (INDEPENDENT_AMBULATORY_CARE_PROVIDER_SITE_OTHER): Payer: Self-pay | Admitting: Physician Assistant

## 2019-07-16 NOTE — Telephone Encounter (Signed)
Call this patient please and see if she can change her appointment to tomorrow if possible please given this message.

## 2019-07-16 NOTE — Telephone Encounter (Signed)
Please review

## 2019-07-17 ENCOUNTER — Ambulatory Visit (INDEPENDENT_AMBULATORY_CARE_PROVIDER_SITE_OTHER): Payer: BC Managed Care – PPO | Admitting: Physician Assistant

## 2019-07-17 ENCOUNTER — Encounter (INDEPENDENT_AMBULATORY_CARE_PROVIDER_SITE_OTHER): Payer: Self-pay | Admitting: Physician Assistant

## 2019-07-17 ENCOUNTER — Other Ambulatory Visit: Payer: Self-pay

## 2019-07-17 DIAGNOSIS — Z6841 Body Mass Index (BMI) 40.0 and over, adult: Secondary | ICD-10-CM | POA: Diagnosis not present

## 2019-07-17 DIAGNOSIS — F3289 Other specified depressive episodes: Secondary | ICD-10-CM | POA: Diagnosis not present

## 2019-07-17 DIAGNOSIS — E559 Vitamin D deficiency, unspecified: Secondary | ICD-10-CM

## 2019-07-17 MED ORDER — SERTRALINE HCL 50 MG PO TABS: 50.0000 mg | ORAL_TABLET | Freq: Every day | ORAL | 0 refills | Status: DC

## 2019-07-17 NOTE — Telephone Encounter (Signed)
Left a voicemail.

## 2019-07-17 NOTE — Telephone Encounter (Signed)
Ok thanks. You can also try to get her in at 52 tomorrow if that works.

## 2019-07-18 NOTE — Progress Notes (Signed)
Office: [phone]  /  Fax: [phone] TeleHealth Visit:  Alexandra Beck has verbally consented to this TeleHealth visit today. The patient is located at home, the provider is located at the [employer] [facility] office. The participants in this visit include the listed provider and patient. The visit was conducted today via Webex.  HPI:   Chief Complaint: OBESITY Alexandra Beck is here to discuss her progress with her obesity treatment plan. She is on the Category 4 plan and is following her eating plan approximately 95% of the time. She states she is exercising 0 minutes 0 times per week. Alexandra Beck reports that she has done a better job following the plan over the last 2 weeks. She is trying to be creative with her dinner meal. We were unable to weigh the patient today for this TeleHealth visit. She feels as if she has lost weight since her last visit. She has lost 57 lbs since starting treatment with [place].  Vitamin D deficiency Alexandra Beck has a diagnosis of Vitamin D deficiency. She is currently taking Vit D weekly but missed a couple of months worth of doses. She denies nausea, vomiting or muscle weakness.  Depression with emotional eating behaviors Alexandra Beck is struggling with emotional eating and using food for comfort to the extent that it is negatively impacting her health. She often snacks when she is not hungry. Alexandra Beck sometimes feels she is out of control and then feels guilty that she made poor food choices. She has been working on behavior modification techniques to help reduce her emotional eating and has been somewhat successful. Alexandra Beck does not feel like Zoloft is helping. She shows no sign of suicidal or homicidal ideations.  Depression screen [facility] 2/9 04/17/2018 02/27/2018 10/11/2017  Decreased Interest 3 0 0  Down, Depressed, Hopeless 3 0 0  PHQ - 2 Score 6 0 0  Altered sleeping 3 - -  Tired, decreased energy 3 - -  Change in appetite 2 - -  Feeling bad or failure about yourself  3 - -    Trouble concentrating 1 - -  Moving slowly or fidgety/restless 3 - -  Suicidal thoughts 2 - -  PHQ-9 Score 23 - -  Difficult doing work/chores Very difficult - -   ASSESSMENT AND PLAN:  Vitamin D deficiency  Other depression - Plan: sertraline (ZOLOFT) 50 MG tablet  Class 3 severe obesity with serious comorbidity and body mass index (BMI) greater than or equal to 70 in adult, unspecified obesity type (HCC)  PLAN:  Vitamin D Deficiency Alexandra Beck was informed that low Vitamin D levels contributes to fatigue and are associated with obesity, breast, and colon cancer. She agrees to continue taking Vit D and will follow-up for routine testing of Vitamin D, at least 2-3 times per year. She was informed of the risk of over-replacement of Vitamin D and agrees to not increase her dose unless she discusses this with [place] first. Alexandra Beck agrees to follow-up with our clinic in 1 week.  Depression with Emotional Eating Behaviors We discussed behavior modification techniques today to help Alexandra Beck deal with her emotional eating and depression. Alexandra Beck will increase Zoloft to 50 mg 1 PO daily #30 with 0 refills and agrees to follow-up with our clinic in 1 week.  Obesity Alexandra Beck is currently in the action stage of change. As such, her goal is to continue with weight loss efforts. She has agreed to follow the Category 4 plan and journal 550-700 calories + 45 grams of protein at  supper. Alexandra Beck has been instructed to work up to a goal of 150 minutes of combined cardio and strengthening exercise per week for weight loss and overall health benefits. We discussed the following Behavioral Modification Strategies today: no skipping meals, work on meal planning and easy cooking plans.  Alexandra Beck has agreed to follow-up with our clinic in 1 week. She was informed of the importance of frequent follow-up visits to maximize her success with intensive lifestyle modifications for her multiple health conditions.  ALLERGIES: Allergies   Allergen Reactions   Aspirin Hives and Swelling    REACTION: throat swelling, hives   Doxycycline Other (See Comments)    Abdominal pain   Lisinopril Cough   Niaspan [Niacin Er]     Caused flushing    MEDICATIONS: Current Outpatient Medications on File Prior to Visit  Medication Sig Dispense Refill   amoxicillin (AMOXIL) 500 MG capsule TAKE 1 CAPSULE(500 MG) BY MOUTH TWICE DAILY 60 capsule 3   furosemide (LASIX) 80 MG tablet TAKE 1 TABLET(80 MG) BY MOUTH DAILY 90 tablet 0   levothyroxine (SYNTHROID, LEVOTHROID) 150 MCG tablet Take 1 tablet (150 mcg total) by mouth daily before breakfast. 90 tablet 0   losartan (COZAAR) 50 MG tablet Take 1 tablet (50 mg total) by mouth daily. 90 tablet 0   metFORMIN (GLUCOPHAGE-XR) 500 MG 24 hr tablet TAKE 1 TABLET(500 MG) BY MOUTH DAILY WITH BREAKFAST 30 tablet 0   metoprolol succinate (TOPROL-XL) 50 MG 24 hr tablet Take with or immediately following a meal. 90 tablet 0   modafinil (PROVIGIL) 100 MG tablet TAKE 1 TABLET(100 MG) BY MOUTH THREE TIMES DAILY 90 tablet 3   mometasone (ELOCON) 0.1 % cream Apply 1 application topically daily. (Patient not taking: Reported on 06/13/2019) 45 g 0   potassium chloride SA (K-DUR,KLOR-CON) 20 MEQ tablet TAKE 2 TABLETS BY MOUTH EVERY DAY 180 tablet 2   rosuvastatin (CRESTOR) 10 MG tablet Take 1 tablet (10 mg total) by mouth daily. 90 tablet 0   Vitamin D, Ergocalciferol, (DRISDOL) 1.25 MG (50000 UT) CAPS capsule TAKE 1 CAPSULE BY MOUTH EVERY 7 DAYS 4 capsule 0   No current facility-administered medications on file prior to visit.     PAST MEDICAL HISTORY: Past Medical History:  Diagnosis Date   Acute renal failure (ARF) ([place]) 11/2012    multifactorial-likely secondary to ATN in the setting of sepsis and hypotension, and also from rhabdomyolysis   Anemia    Cellulitis    Chronic diastolic CHF (congestive heart failure) (HCC)    Depression    Diabetes mellitus (HCC)    GERD  (gastroesophageal reflux disease)    Hyperlipidemia    Hypertension    Hypothyroidism    Morbid obesity with BMI of 70 and over, adult ([place])    Obesity hypoventilation syndrome (HCC)    OSA (obstructive sleep apnea)    PVC's (premature ventricular contractions)    Recurrent cellulitis of lower leg    LLE, venous insuff   Sepsis ([place]) 11/2012   Secondary to cellulitis   Sleep apnea    Venous insufficiency of leg     PAST SURGICAL HISTORY: Past Surgical History:  Procedure Laterality Date   IR FLUORO GUIDE CV MIDLINE PICC RIGHT  03/28/2017   IR US GUIDE VASC ACCESS RIGHT  03/28/2017   WISDOM TOOTH EXTRACTION      SOCIAL HISTORY: Social History   Tobacco Use   Smoking status: Former Smoker    Years: 0.00   Smokeless tobacco: Never Used  Substance Use Topics   Alcohol use: No    Alcohol/week: 0.0 standard drinks   Drug use: No    FAMILY HISTORY: Family History  Problem Relation Age of Onset   Hypertension Mother    Diabetes Mother    High Cholesterol Mother    Thyroid disease Mother    Sleep apnea Mother    Obesity Mother    Hypertension Father    Heart disease Father        before age 31   High Cholesterol Father    Sleep apnea Father    Obesity Father    ROS: Review of Systems  Gastrointestinal: Negative for nausea and vomiting.  Musculoskeletal:       Negative for muscle weakness.  Psychiatric/Behavioral: Positive for depression (emotional eating). Negative for suicidal ideas.       Negative for homicidal ideas.   PHYSICAL EXAM: Pt in no acute distress  RECENT LABS AND TESTS: BMET    Component Value Date/Time   NA 139 07/04/2019 1411   K 4.8 07/04/2019 1411   CL 99 07/04/2019 1411   CO2 24 07/04/2019 1411   GLUCOSE 89 07/04/2019 1411   GLUCOSE 117 (H) 03/23/2018 1224   BUN 22 (H) 07/04/2019 1411   CREATININE 0.83 07/04/2019 1411   CREATININE 1.06 11/14/2016 1050   CALCIUM 9.8 07/04/2019 1411   CALCIUM 9.6 06/26/2007  0000   GFRNONAA 89 07/04/2019 1411   GFRAA 103 07/04/2019 1411   Lab Results  Component Value Date   HGBA1C 5.6 07/04/2019   HGBA1C 5.4 12/04/2018   HGBA1C 5.8 (H) 07/25/2018   HGBA1C 6.5 03/23/2018   HGBA1C 6.6 (H) 03/28/2017   Lab Results  Component Value Date   INSULIN 23.5 07/04/2019   INSULIN 25.8 (H) 12/04/2018   INSULIN 24.7 07/25/2018   INSULIN 25.0 (H) 04/17/2018   CBC    Component Value Date/Time   WBC 5.9 07/25/2018 1234   WBC 7.9 03/23/2018 1224   RBC 5.07 07/25/2018 1234   RBC 5.65 (H) 03/23/2018 1224   HGB 14.8 07/25/2018 1234   HCT 44.8 07/25/2018 1234   PLT 230.0 03/23/2018 1224   MCV 88 07/25/2018 1234   MCH 29.2 07/25/2018 1234   MCH 26.8 04/19/2017 0419   MCHC 33.0 07/25/2018 1234   MCHC 31.6 03/23/2018 1224   RDW 15.6 (H) 07/25/2018 1234   LYMPHSABS 1.4 07/25/2018 1234   MONOABS 0.7 04/16/2017 2351   EOSABS 0.2 07/25/2018 1234   BASOSABS 0.0 07/25/2018 1234   Iron/TIBC/Ferritin/ %Sat No results found for: IRON, TIBC, FERRITIN, IRONPCTSAT Lipid Panel     Component Value Date/Time   CHOL 170 12/04/2018 0812   TRIG 174 (H) 12/04/2018 0812   HDL 43 12/04/2018 0812   CHOLHDL 4 03/23/2018 1224   VLDL 29.6 03/23/2018 1224   LDLCALC 92 12/04/2018 0812   LDLDIRECT 141.0 03/02/2016 1101   Hepatic Function Panel     Component Value Date/Time   PROT 7.3 07/04/2019 1411   ALBUMIN 4.2 07/04/2019 1411   AST 20 07/04/2019 1411   ALT 18 07/04/2019 1411   ALKPHOS 75 07/04/2019 1411   BILITOT 0.4 07/04/2019 1411   BILIDIR 0.1 08/30/2016 1413   IBILI 0.5 12/25/2012 0223      Component Value Date/Time   TSH 1.980 07/04/2019 1411   TSH 1.600 04/17/2018 1239   TSH 3.96 03/23/2018 1224   Results for KHANIYAH, BEZEK (MRN [id number]) as of 07/18/2019 10:57  Ref. Range 07/04/2019 14:11  Vitamin D,  25-Hydroxy Latest Ref Range: 30.0 - 100.0 ng/mL 34.6   I, Michaelene Song, am acting as Location manager for [employer], PA-CI, Abby Potash, PA-C have  reviewed above note and agree with its content

## 2019-07-23 ENCOUNTER — Encounter: Payer: Self-pay | Admitting: Family

## 2019-07-23 ENCOUNTER — Ambulatory Visit (INDEPENDENT_AMBULATORY_CARE_PROVIDER_SITE_OTHER): Payer: BC Managed Care – PPO | Admitting: Family

## 2019-07-23 ENCOUNTER — Other Ambulatory Visit: Payer: Self-pay | Admitting: Family

## 2019-07-23 ENCOUNTER — Other Ambulatory Visit: Payer: Self-pay

## 2019-07-23 VITALS — BP 138/84 | HR 59 | Temp 98.5°F | Ht 62.0 in | Wt >= 6400 oz

## 2019-07-23 DIAGNOSIS — E785 Hyperlipidemia, unspecified: Secondary | ICD-10-CM

## 2019-07-23 DIAGNOSIS — E669 Obesity, unspecified: Secondary | ICD-10-CM

## 2019-07-23 DIAGNOSIS — I10 Essential (primary) hypertension: Secondary | ICD-10-CM

## 2019-07-23 DIAGNOSIS — E1169 Type 2 diabetes mellitus with other specified complication: Secondary | ICD-10-CM

## 2019-07-23 DIAGNOSIS — Z23 Encounter for immunization: Secondary | ICD-10-CM

## 2019-07-23 DIAGNOSIS — E039 Hypothyroidism, unspecified: Secondary | ICD-10-CM | POA: Diagnosis not present

## 2019-07-23 DIAGNOSIS — Z6841 Body Mass Index (BMI) 40.0 and over, adult: Secondary | ICD-10-CM

## 2019-07-23 MED ORDER — METFORMIN HCL 500 MG PO TABS: 500.0000 mg | ORAL_TABLET | Freq: Every day | ORAL | 3 refills | Status: DC

## 2019-07-23 MED ORDER — ROSUVASTATIN CALCIUM 10 MG PO TABS: 10.0000 mg | ORAL_TABLET | Freq: Every day | ORAL | 1 refills | Status: DC

## 2019-07-23 MED ORDER — METOPROLOL SUCCINATE ER 50 MG PO TB24: ORAL_TABLET | ORAL | 3 refills | Status: DC

## 2019-07-23 MED ORDER — METOPROLOL SUCCINATE ER 50 MG PO TB24: 50.0000 mg | ORAL_TABLET | Freq: Every day | ORAL | 3 refills | Status: DC

## 2019-07-23 MED ORDER — FLUCONAZOLE 150 MG PO TABS: 150.0000 mg | ORAL_TABLET | Freq: Once | ORAL | 1 refills | Status: AC

## 2019-07-23 MED ORDER — LEVOTHYROXINE SODIUM 150 MCG PO TABS: 150.0000 ug | ORAL_TABLET | Freq: Every day | ORAL | 3 refills | Status: DC

## 2019-07-23 MED ORDER — METFORMIN HCL 500 MG PO TABS: 500.0000 mg | ORAL_TABLET | Freq: Every day | ORAL | 1 refills | Status: DC

## 2019-07-23 MED ORDER — TRAMADOL HCL 50 MG PO TABS: 50.0000 mg | ORAL_TABLET | Freq: Three times a day (TID) | ORAL | 0 refills | Status: DC | PRN

## 2019-07-23 NOTE — Progress Notes (Signed)
Alexandra Beck is a 38 y.o. female with the following history as recorded in EpicCare:  Patient Active Problem List   Diagnosis Date Noted  . 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 Hypersomnolence 05/11/2018  . Vitamin D deficiency 05/02/2018  . Cellulitis of left leg 04/17/2017  . Cellulitis of right leg 01/11/2017  . PVC's (premature ventricular contractions) 12/02/2016  . Chronic diastolic CHF (congestive heart failure) ([place])   . Acute kidney injury ([place])   . Hyperlipidemia   . Diabetes mellitus type 2 in obese ([place])   . Hypothyroidism   . GERD (gastroesophageal reflux disease)   . Depression   . Venous insufficiency of leg   . Recurrent cellulitis of lower leg 12/25/2012  . OSA (obstructive sleep apnea) 12/25/2012  . Obesity hypoventilation syndrome ([place]) 12/25/2012  . Morbid obesity with BMI of 70 and over, adult ([place]) 12/24/2012  . Essential hypertension 06/26/2007    Current Outpatient Medications  Medication Sig Dispense Refill  . amoxicillin (AMOXIL) 500 MG capsule TAKE 1 CAPSULE(500 MG) BY MOUTH TWICE DAILY 60 capsule 3  . furosemide (LASIX) 80 MG tablet TAKE 1 TABLET(80 MG) BY MOUTH DAILY 90 tablet 0  . levothyroxine (SYNTHROID) 150 MCG tablet Take 1 tablet (150 mcg total) by mouth daily before breakfast. 90 tablet 3  . losartan (COZAAR) 50 MG tablet Take 1 tablet (50 mg total) by mouth daily. 90 tablet 0  . metoprolol succinate (TOPROL-XL) 50 MG 24 hr tablet Take with or immediately following a meal. 90 tablet 3  . modafinil (PROVIGIL) 100 MG tablet TAKE 1 TABLET(100 MG) BY MOUTH THREE TIMES DAILY 90 tablet 3  . potassium chloride SA (K-DUR,KLOR-CON) 20 MEQ tablet TAKE 2 TABLETS BY MOUTH EVERY DAY 180 tablet 2  . rosuvastatin (CRESTOR) 10 MG tablet Take 1 tablet (10 mg total) by mouth daily. 90 tablet 1  . sertraline (ZOLOFT) 50 MG tablet Take 1 tablet (50 mg total) by mouth daily. 30 tablet 0  . Vitamin D, Ergocalciferol,  (DRISDOL) 1.25 MG (50000 UT) CAPS capsule TAKE 1 CAPSULE BY MOUTH EVERY 7 DAYS 4 capsule 0  . fluconazole (DIFLUCAN) 150 MG tablet Take 1 tablet (150 mg total) by mouth once for 1 dose. Repeat after 72 hours 2 tablet 1  . metFORMIN (GLUCOPHAGE) 500 MG tablet Take 1 tablet (500 mg total) by mouth daily. 90 tablet 1  . mometasone (ELOCON) 0.1 % cream Apply 1 application topically daily. (Patient not taking: Reported on 06/13/2019) 45 g 0  . traMADol (ULTRAM) 50 MG tablet Take 1-2 tablets (50-100 mg total) by mouth every 8 (eight) hours as needed. 20 tablet 0   No current facility-administered medications for this visit.     Allergies: Aspirin, Doxycycline, Lisinopril, and Niaspan [niacin er]  Past Medical History:  Diagnosis Date  . Acute renal failure (ARF) ([place]) 11/2012    multifactorial-likely secondary to ATN in the setting of sepsis and hypotension, and also from rhabdomyolysis  . Anemia   . Cellulitis   . Chronic diastolic CHF (congestive heart failure) ([place])   . Depression   . Diabetes mellitus ([place])   . GERD (gastroesophageal reflux disease)   . Hyperlipidemia   . Hypertension   . Hypothyroidism   . Morbid obesity with BMI of 70 and over, adult ([place])   . Obesity hypoventilation syndrome ([place])   . OSA (obstructive sleep apnea)   . PVC's (premature ventricular contractions)   . Recurrent cellulitis of lower  leg    LLE, venous insuff  . Sepsis ([place]) 11/2012   Secondary to cellulitis  . Sleep apnea   . Venous insufficiency of leg     Past Surgical History:  Procedure Laterality Date  . IR FLUORO GUIDE CV MIDLINE PICC RIGHT  03/28/2017  . IR US GUIDE VASC ACCESS RIGHT  03/28/2017  . WISDOM TOOTH EXTRACTION      Family History  Problem Relation Age of Onset  . Hypertension Mother   . Diabetes Mother   . High Cholesterol Mother   . Thyroid disease Mother   . Sleep apnea Mother   . Obesity Mother   . Hypertension Father   . Heart disease Father        before age 35  . High  Cholesterol Father   . Sleep apnea Father   . Obesity Father     Social History   Tobacco Use  . Smoking status: Former Smoker    Years: 0.00  . Smokeless tobacco: Never Used  Substance Use Topics  . Alcohol use: No    Alcohol/week: 0.0 standard drinks    Subjective:  Patient presents for transfer of care from another provider who has left our office; in baseline state of health with no concerns; History of hypertension, hypothyroidism, hyperlipidemia, morbid obesity, heart failure, pre-diabetes; Denies any chest pain, shortness of breath, blurred vision or headache Had labs done earlier this month with her weight loss specialist; majority of healthcare needs are managed by a specialist     Objective:  Vitals:   07/23/19 1106  BP: 138/84  Pulse: (!) 59  Temp: 98.5 F (36.9 C)  TempSrc: Oral  SpO2: 96%  Weight: (!) 409 lb 6.4 oz (185.7 kg)  Height: 5\' 2"  (1.575 m)    General: Well developed, well nourished, in no acute distress  Skin : Warm and dry.  Head: Normocephalic and atraumatic  Lungs: Respirations unlabored; clear to auscultation bilaterally without wheeze, rales, rhonchi  CVS exam: normal rate and regular rhythm.  Extremities: No edema, cyanosis, clubbing  Vessels: Symmetric bilaterally  Neurologic: Alert and oriented; speech intact; face symmetrical; moves all extremities well; CNII-XII intact without focal deficit  Assessment:  1. Hypothyroidism, unspecified type   2. Diabetes mellitus type 2 in obese ([place])   3. Flu vaccine need   4. Hyperlipidemia, unspecified hyperlipidemia type   5. Morbid obesity with BMI of 70 and over, adult ([place])   6. Essential hypertension     Plan:  Labs reviewed from previous visit with weight loss provider; refills updated; flu shot updated; Discussed updating pap smear and patient defers at this time. Follow-up in 1 year, sooner prn.  No follow-ups on file.  Orders Placed This Encounter  Procedures  . Flu Vaccine QUAD  36+ mos IM    Requested Prescriptions   Signed Prescriptions Disp Refills  . metoprolol succinate (TOPROL-XL) 50 MG 24 hr tablet 90 tablet 3    Sig: Take with or immediately following a meal.  . rosuvastatin (CRESTOR) 10 MG tablet 90 tablet 1    Sig: Take 1 tablet (10 mg total) by mouth daily.  Marland Kitchen levothyroxine (SYNTHROID) 150 MCG tablet 90 tablet 3    Sig: Take 1 tablet (150 mcg total) by mouth daily before breakfast.  . metFORMIN (GLUCOPHAGE) 500 MG tablet 90 tablet 1    Sig: Take 1 tablet (500 mg total) by mouth daily.  . fluconazole (DIFLUCAN) 150 MG tablet 2 tablet 1    Sig:  Take 1 tablet (150 mg total) by mouth once for 1 dose. Repeat after 72 hours  . traMADol (ULTRAM) 50 MG tablet 20 tablet 0    Sig: Take 1-2 tablets (50-100 mg total) by mouth every 8 (eight) hours as needed.

## 2019-07-25 ENCOUNTER — Ambulatory Visit (INDEPENDENT_AMBULATORY_CARE_PROVIDER_SITE_OTHER): Payer: BC Managed Care – PPO | Admitting: Physician Assistant

## 2019-07-25 ENCOUNTER — Encounter (INDEPENDENT_AMBULATORY_CARE_PROVIDER_SITE_OTHER): Payer: Self-pay | Admitting: Physician Assistant

## 2019-07-25 ENCOUNTER — Other Ambulatory Visit: Payer: Self-pay

## 2019-07-25 VITALS — BP 129/68 | HR 57 | Temp 98.7°F | Ht 62.0 in | Wt >= 6400 oz

## 2019-07-25 DIAGNOSIS — Z6841 Body Mass Index (BMI) 40.0 and over, adult: Secondary | ICD-10-CM

## 2019-07-25 DIAGNOSIS — E559 Vitamin D deficiency, unspecified: Secondary | ICD-10-CM | POA: Diagnosis not present

## 2019-07-25 NOTE — Progress Notes (Signed)
Office: [phone]  /  Fax: [phone]   HPI:   Chief Complaint: OBESITY Alexandra Beck is here to discuss her progress with her obesity treatment plan. She is on the Category 4 plan and is following her eating plan approximately 70% of the time. She states she is exercising 0 minutes 0 times per week. Alexandra Beck reports that she is still trying to adjust to her new schedule at work and is sometimes not getting all of her food in. Her weight is (!) 404 lb (183.3 kg) today and has had a weight loss of 7 pounds over a period of 3 weeks since her last visit. She has lost 64 lbs since starting treatment with [place].  Vitamin D deficiency Alexandra Beck has a diagnosis of Vitamin D deficiency. She is currently taking Vit D and denies nausea, vomiting or muscle weakness.  ASSESSMENT AND PLAN:  Vitamin D deficiency  Class 3 severe obesity with serious comorbidity and body mass index (BMI) greater than or equal to 70 in adult, unspecified obesity type ([place])  PLAN:  Vitamin D Deficiency Alexandra Beck was informed that low Vitamin D levels contributes to fatigue and are associated with obesity, breast, and colon cancer. She agrees to continue taking Vit D and will follow-up for routine testing of Vitamin D, at least 2-3 times per year. She was informed of the risk of over-replacement of Vitamin D and agrees to not increase her dose unless she discusses this with [place] first. Alexandra Beck agrees to follow-up with our clinic in 2 weeks.  I spent > than 50% of the 25 minute visit on counseling as documented in the note.  Obesity Alexandra Beck is currently in the action stage of change. As such, her goal is to continue with weight loss efforts. She has agreed to follow the Category 4 plan. Alexandra Beck has been instructed to work up to a goal of 150 minutes of combined cardio and strengthening exercise per week for weight loss and overall health benefits. We discussed the following Behavioral Modification Strategies today: work on meal planning and  easy cooking plans, and keeping healthy foods in the home.  Alexandra Beck has agreed to follow-up with our clinic in 2 weeks. She was informed of the importance of frequent follow-up visits to maximize her success with intensive lifestyle modifications for her multiple health conditions.  I spent > than 50% of the 25 minute visit on counseling as documented in the note.    ALLERGIES: Allergies  Allergen Reactions   Aspirin Hives and Swelling    REACTION: throat swelling, hives   Doxycycline Other (See Comments)    Abdominal pain   Lisinopril Cough   Niaspan [Niacin Er]     Caused flushing    MEDICATIONS: Current Outpatient Medications on File Prior to Visit  Medication Sig Dispense Refill   amoxicillin (AMOXIL) 500 MG capsule TAKE 1 CAPSULE(500 MG) BY MOUTH TWICE DAILY 60 capsule 3   furosemide (LASIX) 80 MG tablet TAKE 1 TABLET(80 MG) BY MOUTH DAILY 90 tablet 0   levothyroxine (SYNTHROID) 150 MCG tablet Take 1 tablet (150 mcg total) by mouth daily before breakfast. 90 tablet 3   losartan (COZAAR) 50 MG tablet Take 1 tablet (50 mg total) by mouth daily. 90 tablet 0   metFORMIN (GLUCOPHAGE) 500 MG tablet Take 1 tablet (500 mg total) by mouth daily. 90 tablet 1   metoprolol succinate (TOPROL-XL) 50 MG 24 hr tablet Take 1 tablet (50 mg total) by mouth daily. Take with or immediately following a meal. 90  tablet 3   modafinil (PROVIGIL) 100 MG tablet TAKE 1 TABLET(100 MG) BY MOUTH THREE TIMES DAILY 90 tablet 3   mometasone (ELOCON) 0.1 % cream Apply 1 application topically daily. (Patient not taking: Reported on 06/13/2019) 45 g 0   potassium chloride SA (K-DUR,KLOR-CON) 20 MEQ tablet TAKE 2 TABLETS BY MOUTH EVERY DAY 180 tablet 2   rosuvastatin (CRESTOR) 10 MG tablet Take 1 tablet (10 mg total) by mouth daily. 90 tablet 1   sertraline (ZOLOFT) 50 MG tablet Take 1 tablet (50 mg total) by mouth daily. 30 tablet 0   traMADol (ULTRAM) 50 MG tablet Take 1-2 tablets (50-100 mg total)  by mouth every 8 (eight) hours as needed. 20 tablet 0   Vitamin D, Ergocalciferol, (DRISDOL) 1.25 MG (50000 UT) CAPS capsule TAKE 1 CAPSULE BY MOUTH EVERY 7 DAYS 4 capsule 0   No current facility-administered medications on file prior to visit.     PAST MEDICAL HISTORY: Past Medical History:  Diagnosis Date   Acute renal failure (ARF) ([place]) 11/2012    multifactorial-likely secondary to ATN in the setting of sepsis and hypotension, and also from rhabdomyolysis   Anemia    Cellulitis    Chronic diastolic CHF (congestive heart failure) (HCC)    Depression    Diabetes mellitus (HCC)    GERD (gastroesophageal reflux disease)    Hyperlipidemia    Hypertension    Hypothyroidism    Morbid obesity with BMI of 70 and over, adult ([place])    Obesity hypoventilation syndrome (HCC)    OSA (obstructive sleep apnea)    PVC's (premature ventricular contractions)    Recurrent cellulitis of lower leg    LLE, venous insuff   Sepsis ([place]) 11/2012   Secondary to cellulitis   Sleep apnea    Venous insufficiency of leg     PAST SURGICAL HISTORY: Past Surgical History:  Procedure Laterality Date   IR FLUORO GUIDE CV MIDLINE PICC RIGHT  03/28/2017   IR US GUIDE VASC ACCESS RIGHT  03/28/2017   WISDOM TOOTH EXTRACTION      SOCIAL HISTORY: Social History   Tobacco Use   Smoking status: Former Smoker    Years: 0.00   Smokeless tobacco: Never Used  Substance Use Topics   Alcohol use: No    Alcohol/week: 0.0 standard drinks   Drug use: No    FAMILY HISTORY: Family History  Problem Relation Age of Onset   Hypertension Mother    Diabetes Mother    High Cholesterol Mother    Thyroid disease Mother    Sleep apnea Mother    Obesity Mother    Hypertension Father    Heart disease Father        before age 46   High Cholesterol Father    Sleep apnea Father    Obesity Father    ROS: Review of Systems  Gastrointestinal: Negative for nausea and vomiting.    Musculoskeletal:       Negative for muscle weakness.   PHYSICAL EXAM: Blood pressure 129/68, pulse (!) 57, temperature 98.7 F (37.1 C), temperature source Oral, height 5\' 2"  (1.575 m), weight (!) 404 lb (183.3 kg), SpO2 94 %. Body mass index is 73.89 kg/m. Physical Exam Vitals signs reviewed.  Constitutional:      Appearance: Normal appearance. She is obese.  Cardiovascular:     Rate and Rhythm: Normal rate.     Pulses: Normal pulses.  Pulmonary:     Effort: Pulmonary effort is normal.  Breath sounds: Normal breath sounds.  Musculoskeletal: Normal range of motion.  Skin:    General: Skin is warm and dry.  Neurological:     Mental Status: She is alert and oriented to person, place, and time.  Psychiatric:        Behavior: Behavior normal.   RECENT LABS AND TESTS: BMET    Component Value Date/Time   NA 139 07/04/2019 1411   K 4.8 07/04/2019 1411   CL 99 07/04/2019 1411   CO2 24 07/04/2019 1411   GLUCOSE 89 07/04/2019 1411   GLUCOSE 117 (H) 03/23/2018 1224   BUN 22 (H) 07/04/2019 1411   CREATININE 0.83 07/04/2019 1411   CREATININE 1.06 11/14/2016 1050   CALCIUM 9.8 07/04/2019 1411   CALCIUM 9.6 06/26/2007 0000   GFRNONAA 89 07/04/2019 1411   GFRAA 103 07/04/2019 1411   Lab Results  Component Value Date   HGBA1C 5.6 07/04/2019   HGBA1C 5.4 12/04/2018   HGBA1C 5.8 (H) 07/25/2018   HGBA1C 6.5 03/23/2018   HGBA1C 6.6 (H) 03/28/2017   Lab Results  Component Value Date   INSULIN 23.5 07/04/2019   INSULIN 25.8 (H) 12/04/2018   INSULIN 24.7 07/25/2018   INSULIN 25.0 (H) 04/17/2018   CBC    Component Value Date/Time   WBC 5.9 07/25/2018 1234   WBC 7.9 03/23/2018 1224   RBC 5.07 07/25/2018 1234   RBC 5.65 (H) 03/23/2018 1224   HGB 14.8 07/25/2018 1234   HCT 44.8 07/25/2018 1234   PLT 230.0 03/23/2018 1224   MCV 88 07/25/2018 1234   MCH 29.2 07/25/2018 1234   MCH 26.8 04/19/2017 0419   MCHC 33.0 07/25/2018 1234   MCHC 31.6 03/23/2018 1224   RDW 15.6  (H) 07/25/2018 1234   LYMPHSABS 1.4 07/25/2018 1234   MONOABS 0.7 04/16/2017 2351   EOSABS 0.2 07/25/2018 1234   BASOSABS 0.0 07/25/2018 1234   Iron/TIBC/Ferritin/ %Sat No results found for: IRON, TIBC, FERRITIN, IRONPCTSAT Lipid Panel     Component Value Date/Time   CHOL 170 12/04/2018 0812   TRIG 174 (H) 12/04/2018 0812   HDL 43 12/04/2018 0812   CHOLHDL 4 03/23/2018 1224   VLDL 29.6 03/23/2018 1224   LDLCALC 92 12/04/2018 0812   LDLDIRECT 141.0 03/02/2016 1101   Hepatic Function Panel     Component Value Date/Time   PROT 7.3 07/04/2019 1411   ALBUMIN 4.2 07/04/2019 1411   AST 20 07/04/2019 1411   ALT 18 07/04/2019 1411   ALKPHOS 75 07/04/2019 1411   BILITOT 0.4 07/04/2019 1411   BILIDIR 0.1 08/30/2016 1413   IBILI 0.5 12/25/2012 0223      Component Value Date/Time   TSH 1.980 07/04/2019 1411   TSH 1.600 04/17/2018 1239   TSH 3.96 03/23/2018 1224   Results for ZAELAH, MUGFORD (MRN [id number]) as of 07/25/2019 12:53  Ref. Range 07/04/2019 14:11  Vitamin D, 25-Hydroxy Latest Ref Range: 30.0 - 100.0 ng/mL 34.6   OBESITY BEHAVIORAL INTERVENTION VISIT  Today's visit was #21  Starting weight: 468 lbs Starting date: 04/17/2018 Today's weight: 404 lbs  Today's date: 07/25/2019 Total lbs lost to date: 64    07/25/2019  Height 5\' 2"  (1.575 m)  Weight 404 lb (183.3 kg) (A)  BMI (Calculated) 73.87  BLOOD PRESSURE - SYSTOLIC [id number]  BLOOD PRESSURE - DIASTOLIC 68   Body Fat % [id number] %   ASK: We discussed the diagnosis of obesity with Duwayne Heck today and Makala agreed to give [place] permission to discuss  obesity behavioral modification therapy today.  ASSESS: Jalinda has the diagnosis of obesity and her BMI today is 81.8. Doneisha is in the action stage of change.   ADVISE: Kloey was educated on the multiple health risks of obesity as well as the benefit of weight loss to improve her health. She was advised of the need for long term treatment and the importance of lifestyle  modifications to improve her current health and to decrease her risk of future health problems.  AGREE: Multiple dietary modification options and treatment options were discussed and  Correna agreed to follow the recommendations documented in the above note.  ARRANGE: Katherin was educated on the importance of frequent visits to treat obesity as outlined per CMS and USPSTF guidelines and agreed to schedule her next follow up appointment today.  Migdalia Dk, am acting as transcriptionist for Abby Potash, PA-C I, Abby Potash, PA-C have reviewed above note and agree with its content

## 2019-08-06 ENCOUNTER — Telehealth: Payer: Self-pay | Admitting: Pulmonary Disease

## 2019-08-06 ENCOUNTER — Encounter (INDEPENDENT_AMBULATORY_CARE_PROVIDER_SITE_OTHER): Payer: Self-pay | Admitting: Physician Assistant

## 2019-08-06 NOTE — Telephone Encounter (Signed)
Fax received from [facility] [employer] of Modafinil approval.  Modafinil approved 08/06/19-08/05/2020. [facility] pharmacy called and made aware of approval. ATC patient.  Left message on VM of aproval, and replied to earlier my chart message of Modafinil approval. Nothing further at this time.

## 2019-08-06 NOTE — Telephone Encounter (Signed)
Medication name and strength: Modafinil 100mg  Provider: RA Pharmacy: [facility] Patient insurance ID: [id number] Phone: [phone]  Was the PA started on CMM? yes If yes, please enter the Key: [id number] Timeframe for approval/denial: 24 hours  Gina Schaaf Key: [id number] - PA Case ID: [id number] - Rx #[id number] Need help? Call us at [phone]  Status  Sent to Plan today 08/06/19 Drug Modafinil 100MG  tablets  Form [employer] Electronic PA Form (NCPDP)  Original Claim Philip REQ-MD CALL [phone].DRUG REQUIRES PRIOR AUTHORIZATION  Message routed to [facility], [place], to follow up

## 2019-08-06 NOTE — Telephone Encounter (Signed)
Patient states would like to know the status on the prior authorization for Modafinil.  Also would like to know the insurance needs authorization for oxygen usage.  Insurance is [employer] [place].  Patient phone number is [phone].

## 2019-08-08 ENCOUNTER — Ambulatory Visit (INDEPENDENT_AMBULATORY_CARE_PROVIDER_SITE_OTHER): Payer: BC Managed Care – PPO | Admitting: Physician Assistant

## 2019-08-08 ENCOUNTER — Encounter (INDEPENDENT_AMBULATORY_CARE_PROVIDER_SITE_OTHER): Payer: Self-pay | Admitting: Physician Assistant

## 2019-08-08 ENCOUNTER — Other Ambulatory Visit: Payer: Self-pay

## 2019-08-08 VITALS — BP 136/75 | HR 59 | Temp 98.8°F | Ht 62.0 in | Wt >= 6400 oz

## 2019-08-08 DIAGNOSIS — F3289 Other specified depressive episodes: Secondary | ICD-10-CM

## 2019-08-08 DIAGNOSIS — Z9189 Other specified personal risk factors, not elsewhere classified: Secondary | ICD-10-CM | POA: Diagnosis not present

## 2019-08-08 DIAGNOSIS — E559 Vitamin D deficiency, unspecified: Secondary | ICD-10-CM | POA: Diagnosis not present

## 2019-08-08 DIAGNOSIS — Z6841 Body Mass Index (BMI) 40.0 and over, adult: Secondary | ICD-10-CM

## 2019-08-08 MED ORDER — VITAMIN D (ERGOCALCIFEROL) 1.25 MG (50000 UNIT) PO CAPS: ORAL_CAPSULE | ORAL | 0 refills | Status: DC

## 2019-08-08 MED ORDER — SERTRALINE HCL 100 MG PO TABS: 100.0000 mg | ORAL_TABLET | Freq: Every day | ORAL | 0 refills | Status: DC

## 2019-08-12 NOTE — Progress Notes (Signed)
Office: [phone]  /  Fax: [phone]   HPI:   Chief Complaint: OBESITY Alexandra Beck is here to discuss her progress with her obesity treatment plan. She is on the Category 4 plan and is following her eating plan approximately 65 % of the time. She states she is exercising 0 minutes 0 times per week. Alexandra Beck reports that she had a rough week last week, as she was out of her Provigil. She skipped lunch on some days. Her weight is (!) 406 lb (184.2 kg) today and has had a weight gain of 2 pounds over a period of 2 weeks since her last visit. She has lost 62 lbs since starting treatment with [place].  Vitamin D deficiency Alexandra Beck has a diagnosis of vitamin D deficiency. She is currently taking vit D and denies nausea, vomiting or muscle weakness.  At risk for osteopenia and osteoporosis Alexandra Beck is at higher risk of osteopenia and osteoporosis due to vitamin D deficiency.   Depression with emotional eating behaviors Alexandra Beck is struggling with emotional eating and using food for comfort to the extent that it is negatively impacting her health. She often snacks when she is not hungry. Alexandra Beck sometimes feels she is out of control and then feels guilty that she made poor food choices. She has been working on behavior modification techniques to help reduce her emotional eating and has been somewhat successful. She is on Zoloft currently and she shows no sign of suicidal or homicidal ideations.  ASSESSMENT AND PLAN:  Vitamin D deficiency - Plan: Vitamin D, Ergocalciferol, (DRISDOL) 1.25 MG (50000 UT) CAPS capsule  Other depression - with emotional eating  - Plan: sertraline (ZOLOFT) 100 MG tablet  At risk for osteoporosis  Class 3 severe obesity with serious comorbidity and body mass index (BMI) greater than or equal to 70 in adult, unspecified obesity type (Alexandra Beck)  PLAN:  Vitamin D Deficiency Alexandra Beck was informed that low vitamin D levels contributes to fatigue and are associated with obesity, breast, and  colon cancer. Alexandra Beck agrees to continue to take prescription Vit D @50 ,000 IU every week #4 with no refills and she will follow up for routine testing of vitamin D, at least 2-3 times per year. She was informed of the risk of over-replacement of vitamin D and agrees to not increase her dose unless she discusses this with [place] first. Alexandra Beck agrees to follow up as directed.  At risk for osteopenia and osteoporosis Alexandra Beck was given extended  (15 minutes) osteoporosis prevention counseling today. Alexandra Beck is at risk for osteopenia and osteoporosis due to her vitamin D deficiency. She was encouraged to take her vitamin D and follow her higher calcium diet and increase strengthening exercise to help strengthen her bones and decrease her risk of osteopenia and osteoporosis.  Depression with Emotional Eating Behaviors We discussed behavior modification techniques today to help [name] deal with her emotional eating and depression. She has agreed to increase Zoloft to 100 mg daily with no refills and discontinue the 50 mg dose. Alexandra Beck agreed to follow up as directed.  Obesity Alexandra Beck is currently in the action stage of change. As such, her goal is to continue with weight loss efforts She has agreed to follow the Category 4 plan Alexandra Beck has been instructed to work up to a goal of 150 minutes of combined cardio and strengthening exercise per week for weight loss and overall health benefits. We discussed the following Behavioral Modification Strategies today: no skipping meals and work on meal planning and easy cooking  plans  Alexandra Beck has agreed to follow up with our clinic in 2 weeks. She was informed of the importance of frequent follow up visits to maximize her success with intensive lifestyle modifications for her multiple health conditions.  ALLERGIES: Allergies  Allergen Reactions   Aspirin Hives and Swelling    REACTION: throat swelling, hives   Doxycycline Other (See Comments)    Abdominal pain   Lisinopril  Cough   Niaspan [Niacin Er]     Caused flushing    MEDICATIONS: Current Outpatient Medications on File Prior to Visit  Medication Sig Dispense Refill   amoxicillin (AMOXIL) 500 MG capsule TAKE 1 CAPSULE(500 MG) BY MOUTH TWICE DAILY 60 capsule 3   furosemide (LASIX) 80 MG tablet TAKE 1 TABLET(80 MG) BY MOUTH DAILY 90 tablet 0   levothyroxine (SYNTHROID) 150 MCG tablet Take 1 tablet (150 mcg total) by mouth daily before breakfast. 90 tablet 3   losartan (COZAAR) 50 MG tablet Take 1 tablet (50 mg total) by mouth daily. 90 tablet 0   metFORMIN (GLUCOPHAGE) 500 MG tablet Take 1 tablet (500 mg total) by mouth daily. 90 tablet 1   metoprolol succinate (TOPROL-XL) 50 MG 24 hr tablet Take 1 tablet (50 mg total) by mouth daily. Take with or immediately following a meal. 90 tablet 3   modafinil (PROVIGIL) 100 MG tablet TAKE 1 TABLET(100 MG) BY MOUTH THREE TIMES DAILY 90 tablet 3   mometasone (ELOCON) 0.1 % cream Apply 1 application topically daily. (Patient not taking: Reported on 06/13/2019) 45 g 0   potassium chloride SA (K-DUR,KLOR-CON) 20 MEQ tablet TAKE 2 TABLETS BY MOUTH EVERY DAY 180 tablet 2   rosuvastatin (CRESTOR) 10 MG tablet Take 1 tablet (10 mg total) by mouth daily. 90 tablet 1   traMADol (ULTRAM) 50 MG tablet Take 1-2 tablets (50-100 mg total) by mouth every 8 (eight) hours as needed. 20 tablet 0   No current facility-administered medications on file prior to visit.     PAST MEDICAL HISTORY: Past Medical History:  Diagnosis Date   Acute renal failure (ARF) ([place]) 11/2012    multifactorial-likely secondary to ATN in the setting of sepsis and hypotension, and also from rhabdomyolysis   Anemia    Cellulitis    Chronic diastolic CHF (congestive heart failure) (HCC)    Depression    Diabetes mellitus (HCC)    GERD (gastroesophageal reflux disease)    Hyperlipidemia    Hypertension    Hypothyroidism    Morbid obesity with BMI of 70 and over, adult ([place])     Obesity hypoventilation syndrome (HCC)    OSA (obstructive sleep apnea)    PVC's (premature ventricular contractions)    Recurrent cellulitis of lower leg    LLE, venous insuff   Sepsis ([place]) 11/2012   Secondary to cellulitis   Sleep apnea    Venous insufficiency of leg     PAST SURGICAL HISTORY: Past Surgical History:  Procedure Laterality Date   IR FLUORO GUIDE CV MIDLINE PICC RIGHT  03/28/2017   IR US GUIDE VASC ACCESS RIGHT  03/28/2017   WISDOM TOOTH EXTRACTION      SOCIAL HISTORY: Social History   Tobacco Use   Smoking status: Former Smoker    Years: 0.00   Smokeless tobacco: Never Used  Substance Use Topics   Alcohol use: No    Alcohol/week: 0.0 standard drinks   Drug use: No    FAMILY HISTORY: Family History  Problem Relation Age of Onset   Hypertension Mother  Diabetes Mother    High Cholesterol Mother    Thyroid disease Mother    Sleep apnea Mother    Obesity Mother    Hypertension Father    Heart disease Father        before age 54   High Cholesterol Father    Sleep apnea Father    Obesity Father     ROS: Review of Systems  Constitutional: Negative for weight loss.  Gastrointestinal: Negative for nausea and vomiting.  Musculoskeletal:       Negative for muscle weakness  Psychiatric/Behavioral: Positive for depression. Negative for suicidal ideas.    PHYSICAL EXAM: Blood pressure 136/75, pulse (!) 59, temperature 98.8 F (37.1 C), temperature source Oral, height 5\' 2"  (1.575 m), weight (!) 406 lb (184.2 kg), SpO2 95 %. Body mass index is 74.26 kg/m. Physical Exam Vitals signs reviewed.  Constitutional:      Appearance: Normal appearance. She is well-developed. She is obese.  Cardiovascular:     Rate and Rhythm: Normal rate.  Pulmonary:     Effort: Pulmonary effort is normal.  Musculoskeletal: Normal range of motion.  Skin:    General: Skin is warm and dry.  Neurological:     Mental Status: She is alert and  oriented to person, place, and time.  Psychiatric:        Mood and Affect: Mood normal.        Behavior: Behavior normal.     RECENT LABS AND TESTS: BMET    Component Value Date/Time   NA 139 07/04/2019 1411   K 4.8 07/04/2019 1411   CL 99 07/04/2019 1411   CO2 24 07/04/2019 1411   GLUCOSE 89 07/04/2019 1411   GLUCOSE 117 (H) 03/23/2018 1224   BUN 22 (H) 07/04/2019 1411   CREATININE 0.83 07/04/2019 1411   CREATININE 1.06 11/14/2016 1050   CALCIUM 9.8 07/04/2019 1411   CALCIUM 9.6 06/26/2007 0000   GFRNONAA 89 07/04/2019 1411   GFRAA 103 07/04/2019 1411   Lab Results  Component Value Date   HGBA1C 5.6 07/04/2019   HGBA1C 5.4 12/04/2018   HGBA1C 5.8 (H) 07/25/2018   HGBA1C 6.5 03/23/2018   HGBA1C 6.6 (H) 03/28/2017   Lab Results  Component Value Date   INSULIN 23.5 07/04/2019   INSULIN 25.8 (H) 12/04/2018   INSULIN 24.7 07/25/2018   INSULIN 25.0 (H) 04/17/2018   CBC    Component Value Date/Time   WBC 5.9 07/25/2018 1234   WBC 7.9 03/23/2018 1224   RBC 5.07 07/25/2018 1234   RBC 5.65 (H) 03/23/2018 1224   HGB 14.8 07/25/2018 1234   HCT 44.8 07/25/2018 1234   PLT 230.0 03/23/2018 1224   MCV 88 07/25/2018 1234   MCH 29.2 07/25/2018 1234   MCH 26.8 04/19/2017 0419   MCHC 33.0 07/25/2018 1234   MCHC 31.6 03/23/2018 1224   RDW 15.6 (H) 07/25/2018 1234   LYMPHSABS 1.4 07/25/2018 1234   MONOABS 0.7 04/16/2017 2351   EOSABS 0.2 07/25/2018 1234   BASOSABS 0.0 07/25/2018 1234   Iron/TIBC/Ferritin/ %Sat No results found for: IRON, TIBC, FERRITIN, IRONPCTSAT Lipid Panel     Component Value Date/Time   CHOL 170 12/04/2018 0812   TRIG 174 (H) 12/04/2018 0812   HDL 43 12/04/2018 0812   CHOLHDL 4 03/23/2018 1224   VLDL 29.6 03/23/2018 1224   LDLCALC 92 12/04/2018 0812   LDLDIRECT 141.0 03/02/2016 1101   Hepatic Function Panel     Component Value Date/Time   PROT 7.3 07/04/2019  1411   ALBUMIN 4.2 07/04/2019 1411   AST 20 07/04/2019 1411   ALT 18 07/04/2019  1411   ALKPHOS 75 07/04/2019 1411   BILITOT 0.4 07/04/2019 1411   BILIDIR 0.1 08/30/2016 1413   IBILI 0.5 12/25/2012 0223      Component Value Date/Time   TSH 1.980 07/04/2019 1411   TSH 1.600 04/17/2018 1239   TSH 3.96 03/23/2018 1224     Ref. Range 07/04/2019 14:11  Vitamin D, 25-Hydroxy Latest Ref Range: 30.0 - 100.0 ng/mL 34.6    OBESITY BEHAVIORAL INTERVENTION VISIT  Today's visit was # 23  Starting weight: 468 lbs Starting date: 04/17/2018 Today's weight : 406 lbs Today's date: 08/08/2019 Total lbs lost to date: 62    08/08/2019  Height 5\' 2"  (1.575 m)  Weight 406 lb (184.2 kg) (A)  BMI (Calculated) 74.24  BLOOD PRESSURE - SYSTOLIC [id number]  BLOOD PRESSURE - DIASTOLIC 75   Body Fat % 99991111 %    ASK: We discussed the diagnosis of obesity with Duwayne Heck today and Ethelmae agreed to give [place] permission to discuss obesity behavioral modification therapy today.  ASSESS: Anyiah has the diagnosis of obesity and her BMI today is 74.24 Karlye is in the action stage of change   ADVISE: Crescent was educated on the multiple health risks of obesity as well as the benefit of weight loss to improve her health. She was advised of the need for long term treatment and the importance of lifestyle modifications to improve her current health and to decrease her risk of future health problems.  AGREE: Multiple dietary modification options and treatment options were discussed and  Sibbie agreed to follow the recommendations documented in the above note.  ARRANGE: Vernita was educated on the importance of frequent visits to treat obesity as outlined per CMS and USPSTF guidelines and agreed to schedule her next follow up appointment today.  Corey Skains, am acting as transcriptionist for Abby Potash, PA-C I, Abby Potash, PA-C have reviewed above note and agree with its content

## 2019-08-22 ENCOUNTER — Ambulatory Visit (INDEPENDENT_AMBULATORY_CARE_PROVIDER_SITE_OTHER): Payer: BC Managed Care – PPO | Admitting: Physician Assistant

## 2019-08-22 ENCOUNTER — Encounter (INDEPENDENT_AMBULATORY_CARE_PROVIDER_SITE_OTHER): Payer: Self-pay | Admitting: Physician Assistant

## 2019-08-22 ENCOUNTER — Other Ambulatory Visit: Payer: Self-pay

## 2019-08-22 VITALS — BP 139/84 | HR 55 | Temp 98.8°F | Ht 62.0 in | Wt >= 6400 oz

## 2019-08-22 DIAGNOSIS — E559 Vitamin D deficiency, unspecified: Secondary | ICD-10-CM

## 2019-08-22 DIAGNOSIS — F3289 Other specified depressive episodes: Secondary | ICD-10-CM | POA: Diagnosis not present

## 2019-08-22 DIAGNOSIS — Z9189 Other specified personal risk factors, not elsewhere classified: Secondary | ICD-10-CM | POA: Diagnosis not present

## 2019-08-22 DIAGNOSIS — Z6841 Body Mass Index (BMI) 40.0 and over, adult: Secondary | ICD-10-CM

## 2019-08-22 MED ORDER — SERTRALINE HCL 100 MG PO TABS: 100.0000 mg | ORAL_TABLET | Freq: Every day | ORAL | 0 refills | Status: DC

## 2019-08-22 MED ORDER — VITAMIN D (ERGOCALCIFEROL) 1.25 MG (50000 UNIT) PO CAPS: ORAL_CAPSULE | ORAL | 0 refills | Status: DC

## 2019-08-27 NOTE — Progress Notes (Signed)
Office: [phone]  /  Fax: [phone]   HPI:   Chief Complaint: OBESITY Kinnley is here to discuss her progress with her obesity treatment plan. She is on the Category 4 plan and is following her eating plan approximately 86 % of the time. She states she is exercising 0 minutes 0 times per week. Ayasha reports that she is feeling better since increasing her Zoloft dose and is following the plan a little better. She is celebrating her one year wedding anniversary this weekend. Her weight is (!) 403 lb (182.8 kg) today and has had a weight loss of 3 pounds over a period of 2 weeks since her last visit. She has lost 65 lbs since starting treatment with [place].  Vitamin D deficiency Telecia has a diagnosis of vitamin D deficiency. Jahmya is currently taking vit D and she denies nausea, vomiting or muscle weakness.  At risk for osteopenia and osteoporosis Marjoria is at higher risk of osteopenia and osteoporosis due to vitamin D deficiency.   Depression with emotional eating behaviors Chaitra is struggling with emotional eating and using food for comfort to the extent that it is negatively impacting her health. She often snacks when she is not hungry. Alessandria sometimes feels she is out of control and then feels guilty that she made poor food choices. Hailley is on Zoloft. She has been working on behavior modification techniques to help reduce her emotional eating and has been somewhat successful. She shows no sign of suicidal or homicidal ideations.  ASSESSMENT AND PLAN:  Vitamin D deficiency - Plan: Vitamin D, Ergocalciferol, (DRISDOL) 1.25 MG (50000 UT) CAPS capsule  Other depression - with emotional eating  - Plan: sertraline (ZOLOFT) 100 MG tablet  At risk for osteoporosis  Class 3 severe obesity with serious comorbidity and body mass index (BMI) greater than or equal to 70 in adult, unspecified obesity type ([place])  PLAN:  Vitamin D Deficiency Katalea was informed that low vitamin D levels  contributes to fatigue and are associated with obesity, breast, and colon cancer. Briony agrees to continue to take prescription Vit D @50 ,000 IU every week #4 with no refills and she will follow up for routine testing of vitamin D, at least 2-3 times per year. She was informed of the risk of over-replacement of vitamin D and agrees to not increase her dose unless she discusses this with [place] first. Skylynn agrees to follow up with our clinic in 2 weeks.  At risk for osteopenia and osteoporosis Ladan was given extended  (15 minutes) osteoporosis prevention counseling today. Mccall is at risk for osteopenia and osteoporosis due to her vitamin D deficiency. She was encouraged to take her vitamin D and follow her higher calcium diet and increase strengthening exercise to help strengthen her bones and decrease her risk of osteopenia and osteoporosis.  Depression with Emotional Eating Behaviors We discussed behavior modification techniques today to help Shinita deal with her emotional eating and depression. She has agreed to continue Zoloft 100 mg once daily #30 with no refills and follow up as directed.  Obesity Sani is currently in the action stage of change. As such, her goal is to continue with weight loss efforts She has agreed to follow the Category 4 plan Danashia has been instructed to work up to a goal of 150 minutes of combined cardio and strengthening exercise per week for weight loss and overall health benefits. We discussed the following Behavioral Modification Strategies today: keeping healthy foods in the home and work  on meal planning and easy cooking plans  Abiha has agreed to follow up with our clinic in 2 weeks. She was informed of the importance of frequent follow up visits to maximize her success with intensive lifestyle modifications for her multiple health conditions.  ALLERGIES: Allergies  Allergen Reactions   Aspirin Hives and Swelling    REACTION: throat swelling, hives    Doxycycline Other (See Comments)    Abdominal pain   Lisinopril Cough   Niaspan [Niacin Er]     Caused flushing    MEDICATIONS: Current Outpatient Medications on File Prior to Visit  Medication Sig Dispense Refill   amoxicillin (AMOXIL) 500 MG capsule TAKE 1 CAPSULE(500 MG) BY MOUTH TWICE DAILY 60 capsule 3   furosemide (LASIX) 80 MG tablet TAKE 1 TABLET(80 MG) BY MOUTH DAILY 90 tablet 0   levothyroxine (SYNTHROID) 150 MCG tablet Take 1 tablet (150 mcg total) by mouth daily before breakfast. 90 tablet 3   losartan (COZAAR) 50 MG tablet Take 1 tablet (50 mg total) by mouth daily. 90 tablet 0   metFORMIN (GLUCOPHAGE) 500 MG tablet Take 1 tablet (500 mg total) by mouth daily. 90 tablet 1   metoprolol succinate (TOPROL-XL) 50 MG 24 hr tablet Take 1 tablet (50 mg total) by mouth daily. Take with or immediately following a meal. 90 tablet 3   modafinil (PROVIGIL) 100 MG tablet TAKE 1 TABLET(100 MG) BY MOUTH THREE TIMES DAILY 90 tablet 3   mometasone (ELOCON) 0.1 % cream Apply 1 application topically daily. (Patient not taking: Reported on 06/13/2019) 45 g 0   potassium chloride SA (K-DUR,KLOR-CON) 20 MEQ tablet TAKE 2 TABLETS BY MOUTH EVERY DAY 180 tablet 2   rosuvastatin (CRESTOR) 10 MG tablet Take 1 tablet (10 mg total) by mouth daily. 90 tablet 1   traMADol (ULTRAM) 50 MG tablet Take 1-2 tablets (50-100 mg total) by mouth every 8 (eight) hours as needed. 20 tablet 0   No current facility-administered medications on file prior to visit.     PAST MEDICAL HISTORY: Past Medical History:  Diagnosis Date   Acute renal failure (ARF) ([place]) 11/2012    multifactorial-likely secondary to ATN in the setting of sepsis and hypotension, and also from rhabdomyolysis   Anemia    Cellulitis    Chronic diastolic CHF (congestive heart failure) ([facility])    Depression    Diabetes mellitus ([facility])    GERD (gastroesophageal reflux disease)    Hyperlipidemia    Hypertension     Hypothyroidism    Morbid obesity with BMI of 70 and over, adult ([place])    Obesity hypoventilation syndrome ([facility])    OSA (obstructive sleep apnea)    PVC's (premature ventricular contractions)    Recurrent cellulitis of lower leg    LLE, venous insuff   Sepsis ([place]) 11/2012   Secondary to cellulitis   Sleep apnea    Venous insufficiency of leg     PAST SURGICAL HISTORY: Past Surgical History:  Procedure Laterality Date   IR FLUORO GUIDE CV MIDLINE PICC RIGHT  03/28/2017   IR US GUIDE VASC ACCESS RIGHT  03/28/2017   WISDOM TOOTH EXTRACTION      SOCIAL HISTORY: Social History   Tobacco Use   Smoking status: Former Smoker    Years: 0.00   Smokeless tobacco: Never Used  Substance Use Topics   Alcohol use: No    Alcohol/week: 0.0 standard drinks   Drug use: No    FAMILY HISTORY: Family History  Problem Relation Age  of Onset   Hypertension Mother    Diabetes Mother    High Cholesterol Mother    Thyroid disease Mother    Sleep apnea Mother    Obesity Mother    Hypertension Father    Heart disease Father        before age 19   High Cholesterol Father    Sleep apnea Father    Obesity Father     ROS: Review of Systems  Constitutional: Positive for weight loss.  Gastrointestinal: Negative for nausea and vomiting.  Musculoskeletal:       Negative for muscle weakness  Psychiatric/Behavioral: Positive for depression. Negative for suicidal ideas.    PHYSICAL EXAM: Blood pressure 139/84, pulse (!) 55, temperature 98.8 F (37.1 C), temperature source Oral, height 5\' 2"  (1.575 m), weight (!) 403 lb (182.8 kg), SpO2 93 %. Body mass index is 73.71 kg/m. Physical Exam Vitals signs reviewed.  Constitutional:      Appearance: Normal appearance. She is well-developed. She is obese.  Cardiovascular:     Rate and Rhythm: Normal rate.  Pulmonary:     Effort: Pulmonary effort is normal.  Musculoskeletal: Normal range of motion.  Skin:    General: Skin  is warm and dry.  Neurological:     Mental Status: She is alert and oriented to person, place, and time.  Psychiatric:        Mood and Affect: Mood normal.        Behavior: Behavior normal.        Thought Content: Thought content does not include homicidal or suicidal ideation.     RECENT LABS AND TESTS: BMET    Component Value Date/Time   NA 139 07/04/2019 1411   K 4.8 07/04/2019 1411   CL 99 07/04/2019 1411   CO2 24 07/04/2019 1411   GLUCOSE 89 07/04/2019 1411   GLUCOSE 117 (H) 03/23/2018 1224   BUN 22 (H) 07/04/2019 1411   CREATININE 0.83 07/04/2019 1411   CREATININE 1.06 11/14/2016 1050   CALCIUM 9.8 07/04/2019 1411   CALCIUM 9.6 06/26/2007 0000   GFRNONAA 89 07/04/2019 1411   GFRAA 103 07/04/2019 1411   Lab Results  Component Value Date   HGBA1C 5.6 07/04/2019   HGBA1C 5.4 12/04/2018   HGBA1C 5.8 (H) 07/25/2018   HGBA1C 6.5 03/23/2018   HGBA1C 6.6 (H) 03/28/2017   Lab Results  Component Value Date   INSULIN 23.5 07/04/2019   INSULIN 25.8 (H) 12/04/2018   INSULIN 24.7 07/25/2018   INSULIN 25.0 (H) 04/17/2018   CBC    Component Value Date/Time   WBC 5.9 07/25/2018 1234   WBC 7.9 03/23/2018 1224   RBC 5.07 07/25/2018 1234   RBC 5.65 (H) 03/23/2018 1224   HGB 14.8 07/25/2018 1234   HCT 44.8 07/25/2018 1234   PLT 230.0 03/23/2018 1224   MCV 88 07/25/2018 1234   MCH 29.2 07/25/2018 1234   MCH 26.8 04/19/2017 0419   MCHC 33.0 07/25/2018 1234   MCHC 31.6 03/23/2018 1224   RDW 15.6 (H) 07/25/2018 1234   LYMPHSABS 1.4 07/25/2018 1234   MONOABS 0.7 04/16/2017 2351   EOSABS 0.2 07/25/2018 1234   BASOSABS 0.0 07/25/2018 1234   Iron/TIBC/Ferritin/ %Sat No results found for: IRON, TIBC, FERRITIN, IRONPCTSAT Lipid Panel     Component Value Date/Time   CHOL 170 12/04/2018 0812   TRIG 174 (H) 12/04/2018 0812   HDL 43 12/04/2018 0812   CHOLHDL 4 03/23/2018 1224   VLDL 29.6 03/23/2018 1224  LDLCALC 92 12/04/2018 0812   LDLDIRECT 141.0 03/02/2016 1101    Hepatic Function Panel     Component Value Date/Time   PROT 7.3 07/04/2019 1411   ALBUMIN 4.2 07/04/2019 1411   AST 20 07/04/2019 1411   ALT 18 07/04/2019 1411   ALKPHOS 75 07/04/2019 1411   BILITOT 0.4 07/04/2019 1411   BILIDIR 0.1 08/30/2016 1413   IBILI 0.5 12/25/2012 0223      Component Value Date/Time   TSH 1.980 07/04/2019 1411   TSH 1.600 04/17/2018 1239   TSH 3.96 03/23/2018 1224      OBESITY BEHAVIORAL INTERVENTION VISIT  Today's visit was # 24  Starting weight: 468 lbs Starting date: 04/17/2018 Today's weight : 403 lbs Today's date: 08/22/2019 Total lbs lost to date: 65    08/22/2019  Height 5\' 2"  (1.575 m)  Weight 403 lb (182.8 kg) (A)  BMI (Calculated) 73.69  BLOOD PRESSURE - SYSTOLIC [id number]  BLOOD PRESSURE - DIASTOLIC 84   Body Fat % [id number] %    ASK: We discussed the diagnosis of obesity with Duwayne Heck today and Rickesha agreed to give [place] permission to discuss obesity behavioral modification therapy today.  ASSESS: Promiss has the diagnosis of obesity and her BMI today is 73.69 Mariaclara is in the action stage of change   ADVISE: Lashel was educated on the multiple health risks of obesity as well as the benefit of weight loss to improve her health. She was advised of the need for long term treatment and the importance of lifestyle modifications to improve her current health and to decrease her risk of future health problems.  AGREE: Multiple dietary modification options and treatment options were discussed and  Jacklyne agreed to follow the recommendations documented in the above note.  ARRANGE: Yexalen was educated on the importance of frequent visits to treat obesity as outlined per CMS and USPSTF guidelines and agreed to schedule her next follow up appointment today.  Corey Skains, am acting as transcriptionist for Abby Potash, PA-C I, Abby Potash, PA-C have reviewed above note and agree with its content

## 2019-09-05 ENCOUNTER — Encounter (INDEPENDENT_AMBULATORY_CARE_PROVIDER_SITE_OTHER): Payer: Self-pay | Admitting: Family Medicine

## 2019-09-05 ENCOUNTER — Other Ambulatory Visit: Payer: Self-pay

## 2019-09-05 ENCOUNTER — Ambulatory Visit (INDEPENDENT_AMBULATORY_CARE_PROVIDER_SITE_OTHER): Payer: BC Managed Care – PPO | Admitting: Family Medicine

## 2019-09-05 VITALS — BP 146/67 | HR 57 | Temp 98.3°F | Ht 62.0 in | Wt >= 6400 oz

## 2019-09-05 DIAGNOSIS — Z9189 Other specified personal risk factors, not elsewhere classified: Secondary | ICD-10-CM | POA: Diagnosis not present

## 2019-09-05 DIAGNOSIS — E559 Vitamin D deficiency, unspecified: Secondary | ICD-10-CM

## 2019-09-05 DIAGNOSIS — Z6841 Body Mass Index (BMI) 40.0 and over, adult: Secondary | ICD-10-CM

## 2019-09-05 DIAGNOSIS — F419 Anxiety disorder, unspecified: Secondary | ICD-10-CM | POA: Diagnosis not present

## 2019-09-05 DIAGNOSIS — E66813 Obesity, class 3: Secondary | ICD-10-CM

## 2019-09-05 DIAGNOSIS — F329 Major depressive disorder, single episode, unspecified: Secondary | ICD-10-CM

## 2019-09-08 ENCOUNTER — Other Ambulatory Visit: Payer: Self-pay | Admitting: Physician Assistant

## 2019-09-08 DIAGNOSIS — I509 Heart failure, unspecified: Secondary | ICD-10-CM

## 2019-09-08 NOTE — Progress Notes (Signed)
Office: [phone]  /  Fax: [phone]   HPI:   Chief Complaint: OBESITY Alexandra Beck is here to discuss her progress with her obesity treatment plan. She is on the Category 4 plan and is following her eating plan approximately 45 % of the time. She states she is exercising 0 minutes 0 times per week. Alexandra Beck voices last week she took off from doing the meal plan secondary to celebrating her 1 year anniversary. She is wanting to get back on track and do some October fest meals.  Her weight is (!) 415 lb (188.2 kg) today and has gained 12 lbs since her last visit. She has lost 53 lbs since starting treatment with [place].  Vitamin D Deficiency Alexandra Beck has a diagnosis of vitamin D deficiency. She is currently taking prescription Vit D. She notes fatigue and denies nausea, vomiting or muscle weakness.  At risk for osteopenia and osteoporosis Alexandra Beck is at higher risk of osteopenia and osteoporosis due to vitamin D deficiency.   Depression with Anxiety Alexandra Beck struggles with depression and anxiety. She is having some sexual side effects with anorgasmia. She shows no sign of suicidal or homicidal ideations.  ASSESSMENT AND PLAN:  No diagnosis found.  PLAN:  Vitamin D Deficiency Syreeta was informed that low vitamin D levels contributes to fatigue and are associated with obesity, breast, and colon cancer. Prerana agrees to continue taking prescription Vit D 50,000 IU every week #4 and we will refill for 1 month. She will follow up for routine testing of vitamin D, at least 2-3 times per year. She was informed of the risk of over-replacement of vitamin D and agrees to not increase her dose unless she discusses this with [place] first. Hatsuko agrees to follow up with our clinic in 2 weeks.  At risk for osteopenia and osteoporosis Alexandra Beck was given extended (15 minutes) osteoporosis prevention counseling today. Khristen is at risk for osteopenia and osteoporsis due to her vitamin D deficiency. She was encouraged to take  her vitamin D and follow her higher calcium diet and increase strengthening exercise to help strengthen her bones and decrease her risk of osteopenia and osteoporosis.  Depression with Anxiety We discussed behavior modification techniques today to help Alexandra Beck deal with her depression and anxiety. She is to look up [employer], and she may want to switch over. Alexandra Beck agrees to follow up with our clinic in 2 weeks.  Obesity Alexandra Beck is currently in the action stage of change. As such, her goal is to continue with weight loss efforts She has agreed to follow the Category 4 plan Alexandra Beck has been instructed to work up to a goal of 150 minutes of combined cardio and strengthening exercise per week for weight loss and overall health benefits. We discussed the following Behavioral Modification Strategies today: increasing lean protein intake, increasing vegetables and work on meal planning and easy cooking plans, keeping healthy foods in the home, and planning for success   Mikiya has agreed to follow up with our clinic in 2 weeks. She was informed of the importance of frequent follow up visits to maximize her success with intensive lifestyle modifications for her multiple health conditions.  ALLERGIES: Allergies  Allergen Reactions  . Aspirin Hives and Swelling    REACTION: throat swelling, hives  . Doxycycline Other (See Comments)    Abdominal pain  . Lisinopril Cough  . Niaspan [Niacin Er]     Caused flushing    MEDICATIONS: Current Outpatient Medications on File Prior to Visit  Medication  Sig Dispense Refill  . amoxicillin (AMOXIL) 500 MG capsule TAKE 1 CAPSULE(500 MG) BY MOUTH TWICE DAILY 60 capsule 3  . furosemide (LASIX) 80 MG tablet TAKE 1 TABLET(80 MG) BY MOUTH DAILY 90 tablet 0  . levothyroxine (SYNTHROID) 150 MCG tablet Take 1 tablet (150 mcg total) by mouth daily before breakfast. 90 tablet 3  . losartan (COZAAR) 50 MG tablet Take 1 tablet (50 mg total) by mouth daily. 90 tablet 0  .  metFORMIN (GLUCOPHAGE) 500 MG tablet Take 1 tablet (500 mg total) by mouth daily. 90 tablet 1  . metoprolol succinate (TOPROL-XL) 50 MG 24 hr tablet Take 1 tablet (50 mg total) by mouth daily. Take with or immediately following a meal. 90 tablet 3  . modafinil (PROVIGIL) 100 MG tablet TAKE 1 TABLET(100 MG) BY MOUTH THREE TIMES DAILY 90 tablet 3  . potassium chloride SA (K-DUR,KLOR-CON) 20 MEQ tablet TAKE 2 TABLETS BY MOUTH EVERY DAY 180 tablet 2  . rosuvastatin (CRESTOR) 10 MG tablet Take 1 tablet (10 mg total) by mouth daily. 90 tablet 1  . sertraline (ZOLOFT) 100 MG tablet Take 1 tablet (100 mg total) by mouth daily. 30 tablet 0  . Vitamin D, Ergocalciferol, (DRISDOL) 1.25 MG (50000 UT) CAPS capsule TAKE 1 CAPSULE BY MOUTH EVERY 7 DAYS 4 capsule 0  . mometasone (ELOCON) 0.1 % cream Apply 1 application topically daily. (Patient not taking: Reported on 09/05/2019) 45 g 0  . traMADol (ULTRAM) 50 MG tablet Take 1-2 tablets (50-100 mg total) by mouth every 8 (eight) hours as needed. (Patient not taking: Reported on 09/05/2019) 20 tablet 0   No current facility-administered medications on file prior to visit.     PAST MEDICAL HISTORY: Past Medical History:  Diagnosis Date  . Acute renal failure (ARF) ([place]) 11/2012    multifactorial-likely secondary to ATN in the setting of sepsis and hypotension, and also from rhabdomyolysis  . Anemia   . Cellulitis   . Chronic diastolic CHF (congestive heart failure) ([place])   . Depression   . Diabetes mellitus ([place])   . GERD (gastroesophageal reflux disease)   . Hyperlipidemia   . Hypertension   . Hypothyroidism   . Morbid obesity with BMI of 70 and over, adult ([place])   . Obesity hypoventilation syndrome ([place])   . OSA (obstructive sleep apnea)   . PVC's (premature ventricular contractions)   . Recurrent cellulitis of lower leg    LLE, venous insuff  . Sepsis ([place]) 11/2012   Secondary to cellulitis  . Sleep apnea   . Venous insufficiency of leg     PAST  SURGICAL HISTORY: Past Surgical History:  Procedure Laterality Date  . IR FLUORO GUIDE CV MIDLINE PICC RIGHT  03/28/2017  . IR US GUIDE VASC ACCESS RIGHT  03/28/2017  . WISDOM TOOTH EXTRACTION      SOCIAL HISTORY: Social History   Tobacco Use  . Smoking status: Former Smoker    Years: 0.00  . Smokeless tobacco: Never Used  Substance Use Topics  . Alcohol use: No    Alcohol/week: 0.0 standard drinks  . Drug use: No    FAMILY HISTORY: Family History  Problem Relation Age of Onset  . Hypertension Mother   . Diabetes Mother   . High Cholesterol Mother   . Thyroid disease Mother   . Sleep apnea Mother   . Obesity Mother   . Hypertension Father   . Heart disease Father        before age 28  .  High Cholesterol Father   . Sleep apnea Father   . Obesity Father     ROS: Review of Systems  Constitutional: Positive for malaise/fatigue. Negative for weight loss.  Gastrointestinal: Negative for nausea and vomiting.  Musculoskeletal:       Negative muscle weakness  Psychiatric/Behavioral: Positive for depression. Negative for suicidal ideas.       + Anxiety    PHYSICAL EXAM: Blood pressure (!) 146/67, pulse (!) 57, temperature 98.3 F (36.8 C), temperature source Oral, height 5\' 2"  (1.575 m), weight (!) 415 lb (188.2 kg), SpO2 96 %. Body mass index is 75.9 kg/m. Physical Exam Vitals signs reviewed.  Constitutional:      Appearance: Normal appearance. She is obese.  Cardiovascular:     Rate and Rhythm: Normal rate.     Pulses: Normal pulses.  Pulmonary:     Effort: Pulmonary effort is normal.     Breath sounds: Normal breath sounds.  Musculoskeletal: Normal range of motion.  Skin:    General: Skin is warm and dry.  Neurological:     Mental Status: She is alert and oriented to person, place, and time.  Psychiatric:        Mood and Affect: Mood normal.        Behavior: Behavior normal.     RECENT LABS AND TESTS: BMET    Component Value Date/Time   NA 139  07/04/2019 1411   K 4.8 07/04/2019 1411   CL 99 07/04/2019 1411   CO2 24 07/04/2019 1411   GLUCOSE 89 07/04/2019 1411   GLUCOSE 117 (H) 03/23/2018 1224   BUN 22 (H) 07/04/2019 1411   CREATININE 0.83 07/04/2019 1411   CREATININE 1.06 11/14/2016 1050   CALCIUM 9.8 07/04/2019 1411   CALCIUM 9.6 06/26/2007 0000   GFRNONAA 89 07/04/2019 1411   GFRAA 103 07/04/2019 1411   Lab Results  Component Value Date   HGBA1C 5.6 07/04/2019   HGBA1C 5.4 12/04/2018   HGBA1C 5.8 (H) 07/25/2018   HGBA1C 6.5 03/23/2018   HGBA1C 6.6 (H) 03/28/2017   Lab Results  Component Value Date   INSULIN 23.5 07/04/2019   INSULIN 25.8 (H) 12/04/2018   INSULIN 24.7 07/25/2018   INSULIN 25.0 (H) 04/17/2018   CBC    Component Value Date/Time   WBC 5.9 07/25/2018 1234   WBC 7.9 03/23/2018 1224   RBC 5.07 07/25/2018 1234   RBC 5.65 (H) 03/23/2018 1224   HGB 14.8 07/25/2018 1234   HCT 44.8 07/25/2018 1234   PLT 230.0 03/23/2018 1224   MCV 88 07/25/2018 1234   MCH 29.2 07/25/2018 1234   MCH 26.8 04/19/2017 0419   MCHC 33.0 07/25/2018 1234   MCHC 31.6 03/23/2018 1224   RDW 15.6 (H) 07/25/2018 1234   LYMPHSABS 1.4 07/25/2018 1234   MONOABS 0.7 04/16/2017 2351   EOSABS 0.2 07/25/2018 1234   BASOSABS 0.0 07/25/2018 1234   Iron/TIBC/Ferritin/ %Sat No results found for: IRON, TIBC, FERRITIN, IRONPCTSAT Lipid Panel     Component Value Date/Time   CHOL 170 12/04/2018 0812   TRIG 174 (H) 12/04/2018 0812   HDL 43 12/04/2018 0812   CHOLHDL 4 03/23/2018 1224   VLDL 29.6 03/23/2018 1224   LDLCALC 92 12/04/2018 0812   LDLDIRECT 141.0 03/02/2016 1101   Hepatic Function Panel     Component Value Date/Time   PROT 7.3 07/04/2019 1411   ALBUMIN 4.2 07/04/2019 1411   AST 20 07/04/2019 1411   ALT 18 07/04/2019 1411   ALKPHOS 75 07/04/2019  1411   BILITOT 0.4 07/04/2019 1411   BILIDIR 0.1 08/30/2016 1413   IBILI 0.5 12/25/2012 0223      Component Value Date/Time   TSH 1.980 07/04/2019 1411   TSH 1.600  04/17/2018 1239   TSH 3.96 03/23/2018 1224      OBESITY BEHAVIORAL INTERVENTION VISIT  Today's visit was # 25   Starting weight: 468 lbs Starting date: 04/17/18 Today's weight : 415 lbs Today's date: 09/05/2019 Total lbs lost to date: 68    ASK: We discussed the diagnosis of obesity with Duwayne Heck today and Brendan agreed to give [place] permission to discuss obesity behavioral modification therapy today.  ASSESS: Wallie has the diagnosis of obesity and her BMI today is 75.89 Coralynn is in the action stage of change   ADVISE: Merilyn was educated on the multiple health risks of obesity as well as the benefit of weight loss to improve her health. She was advised of the need for long term treatment and the importance of lifestyle modifications to improve her current health and to decrease her risk of future health problems.  AGREE: Multiple dietary modification options and treatment options were discussed and  Alexiss agreed to follow the recommendations documented in the above note.  ARRANGE: Cathryn was educated on the importance of frequent visits to treat obesity as outlined per CMS and USPSTF guidelines and agreed to schedule her next follow up appointment today.  I, Trixie Dredge, am acting as transcriptionist for [name] Qua, MD  I have reviewed the above documentation for accuracy and completeness, and I agree with the above. - [name] Qua, MD

## 2019-09-10 ENCOUNTER — Other Ambulatory Visit: Payer: Self-pay | Admitting: Cardiology

## 2019-09-10 DIAGNOSIS — I509 Heart failure, unspecified: Secondary | ICD-10-CM

## 2019-09-10 MED ORDER — POTASSIUM CHLORIDE CRYS ER 20 MEQ PO TBCR: 40.0000 meq | EXTENDED_RELEASE_TABLET | Freq: Every day | ORAL | 0 refills | Status: DC

## 2019-09-17 MED ORDER — VITAMIN D (ERGOCALCIFEROL) 1.25 MG (50000 UNIT) PO CAPS: ORAL_CAPSULE | ORAL | 0 refills | Status: DC

## 2019-09-20 ENCOUNTER — Encounter (INDEPENDENT_AMBULATORY_CARE_PROVIDER_SITE_OTHER): Payer: Self-pay | Admitting: Physician Assistant

## 2019-09-23 ENCOUNTER — Other Ambulatory Visit: Payer: Self-pay | Admitting: Cardiology

## 2019-09-23 ENCOUNTER — Other Ambulatory Visit (INDEPENDENT_AMBULATORY_CARE_PROVIDER_SITE_OTHER): Payer: Self-pay

## 2019-09-23 DIAGNOSIS — F3289 Other specified depressive episodes: Secondary | ICD-10-CM

## 2019-09-23 MED ORDER — SERTRALINE HCL 50 MG PO TABS: 50.0000 mg | ORAL_TABLET | Freq: Every day | ORAL | 0 refills | Status: DC

## 2019-09-23 NOTE — Telephone Encounter (Signed)
Please advise 

## 2019-09-23 NOTE — Telephone Encounter (Signed)
Can you refill her zoloft but send in 50mg  1 po daily # 30 please

## 2019-09-23 NOTE — Telephone Encounter (Signed)
Prescription was sent to the pharmacy.

## 2019-09-25 ENCOUNTER — Encounter (INDEPENDENT_AMBULATORY_CARE_PROVIDER_SITE_OTHER): Payer: Self-pay | Admitting: Physician Assistant

## 2019-09-25 ENCOUNTER — Other Ambulatory Visit: Payer: Self-pay

## 2019-09-25 ENCOUNTER — Ambulatory Visit (INDEPENDENT_AMBULATORY_CARE_PROVIDER_SITE_OTHER): Payer: BC Managed Care – PPO | Admitting: Physician Assistant

## 2019-09-25 ENCOUNTER — Other Ambulatory Visit: Payer: Self-pay | Admitting: Cardiology

## 2019-09-25 VITALS — BP 151/96 | HR 76 | Temp 98.8°F | Ht 62.0 in | Wt >= 6400 oz

## 2019-09-25 DIAGNOSIS — E7849 Other hyperlipidemia: Secondary | ICD-10-CM

## 2019-09-25 DIAGNOSIS — F3289 Other specified depressive episodes: Secondary | ICD-10-CM | POA: Diagnosis not present

## 2019-09-25 DIAGNOSIS — Z6841 Body Mass Index (BMI) 40.0 and over, adult: Secondary | ICD-10-CM

## 2019-09-25 DIAGNOSIS — Z9189 Other specified personal risk factors, not elsewhere classified: Secondary | ICD-10-CM

## 2019-09-25 MED ORDER — FUROSEMIDE 80 MG PO TABS: ORAL_TABLET | ORAL | 0 refills | Status: DC

## 2019-09-25 MED ORDER — VORTIOXETINE HBR 5 MG PO TABS: 5.0000 mg | ORAL_TABLET | Freq: Every day | ORAL | 0 refills | Status: DC

## 2019-09-26 NOTE — Progress Notes (Signed)
Office: [phone]  /  Fax: [phone]   HPI:   Chief Complaint: OBESITY Taylor is here to discuss her progress with her obesity treatment plan. She is on the Category 4 plan and is following her eating plan approximately 75 % of the time. She states she is exercising 0 minutes 0 times per week. Reanna reports that she has been off her routine, due to her partner's work schedule changing. She wants to keep her dinner meals interesting. Her weight is (!) 417 lb (189.1 kg) today and has had a weight gain of 2 pounds over a period of 3 weeks since her last visit. She has lost 51 lbs since starting treatment with [place].  Hyperlipidemia Shaundra has hyperlipidemia and she is on Crestor. She has been trying to improve her cholesterol levels with intensive lifestyle modification including a low saturated fat diet, exercise and weight loss. She denies any chest pain.  At risk for cardiovascular disease Vicki is at a higher than average risk for cardiovascular disease due to obesity and hyperlipidemia. She currently denies any chest pain.  Depression with emotional eating behaviors Anessia is on Setraline with sexual side effects. She struggles with emotional eating and using food for comfort to the extent that it is negatively impacting her health. She often snacks when she is not hungry. Lillian sometimes feels she is out of control and then feels guilty that she made poor food choices. She has been working on behavior modification techniques to help reduce her emotional eating and has been somewhat successful. She shows no sign of suicidal or homicidal ideations.  ASSESSMENT AND PLAN:  Other hyperlipidemia  Other depression - with emotional eating - Plan: vortioxetine HBr (TRINTELLIX) 5 MG TABS tablet  At risk for heart disease  Class 3 severe obesity with serious comorbidity and body mass index (BMI) greater than or equal to 70 in adult, unspecified obesity type  ([place])  PLAN:  Hyperlipidemia Mikeyla was informed of the American Heart Association Guidelines emphasizing intensive lifestyle modifications as the first line treatment for hyperlipidemia. We discussed many lifestyle modifications today in depth, and Earsie will continue to work on decreasing saturated fats such as fatty red meat, butter and many fried foods. She will also increase vegetables and lean protein in her diet and continue to work on exercise and weight loss efforts. Jaliya will continue Crestor and follow up as directed.  Cardiovascular risk counseling Denene was given extended (15 minutes) coronary artery disease prevention counseling today. She is 38 y.o. female and has risk factors for heart disease including obesity and hyperlipidemia. We discussed intensive lifestyle modifications today with an emphasis on specific weight loss instructions and strategies. Pt was also informed of the importance of increasing exercise and decreasing saturated fats to help prevent heart disease.  Depression with Emotional Eating Behaviors We discussed behavior modification techniques today to help Deysy deal with her emotional eating and depression. She has agreed to discontinue sertraline and start trintellix 5 mg daily #30 with no refills and  follow up as directed.  Obesity Evania is currently in the action stage of change. As such, her goal is to continue with weight loss efforts She has agreed to follow the Category 4 plan Masey has been instructed to work up to a goal of 150 minutes of combined cardio and strengthening exercise per week for weight loss and overall health benefits. We discussed the following Behavioral Modification Strategies today: keeping healthy foods in the home and work on meal planning  and easy cooking plans  Maleeyah has agreed to follow up with our clinic in 3 weeks. She was informed of the importance of frequent follow up visits to maximize her success with intensive lifestyle  modifications for her multiple health conditions.  ALLERGIES: Allergies  Allergen Reactions   Aspirin Hives and Swelling    REACTION: throat swelling, hives   Doxycycline Other (See Comments)    Abdominal pain   Lisinopril Cough   Niaspan [Niacin Er]     Caused flushing    MEDICATIONS: Current Outpatient Medications on File Prior to Visit  Medication Sig Dispense Refill   amoxicillin (AMOXIL) 500 MG capsule TAKE 1 CAPSULE(500 MG) BY MOUTH TWICE DAILY 60 capsule 3   levothyroxine (SYNTHROID) 150 MCG tablet Take 1 tablet (150 mcg total) by mouth daily before breakfast. 90 tablet 3   losartan (COZAAR) 50 MG tablet Take 1 tablet (50 mg total) by mouth daily. 90 tablet 0   metFORMIN (GLUCOPHAGE) 500 MG tablet Take 1 tablet (500 mg total) by mouth daily. 90 tablet 1   metoprolol succinate (TOPROL-XL) 50 MG 24 hr tablet Take 1 tablet (50 mg total) by mouth daily. Take with or immediately following a meal. 90 tablet 3   modafinil (PROVIGIL) 100 MG tablet TAKE 1 TABLET(100 MG) BY MOUTH THREE TIMES DAILY 90 tablet 3   mometasone (ELOCON) 0.1 % cream Apply 1 application topically daily. (Patient not taking: Reported on 09/05/2019) 45 g 0   potassium chloride SA (KLOR-CON) 20 MEQ tablet Take 2 tablets (40 mEq total) by mouth daily. Please make overdue appt with Dr. Meda Coffee before anymore refills. 1st attempt 60 tablet 0   rosuvastatin (CRESTOR) 10 MG tablet Take 1 tablet (10 mg total) by mouth daily. 90 tablet 1   traMADol (ULTRAM) 50 MG tablet Take 1-2 tablets (50-100 mg total) by mouth every 8 (eight) hours as needed. (Patient not taking: Reported on 09/05/2019) 20 tablet 0   Vitamin D, Ergocalciferol, (DRISDOL) 1.25 MG (50000 UT) CAPS capsule TAKE 1 CAPSULE BY MOUTH EVERY 7 DAYS 4 capsule 0   No current facility-administered medications on file prior to visit.     PAST MEDICAL HISTORY: Past Medical History:  Diagnosis Date   Acute renal failure (ARF) ([place]) 11/2012     multifactorial-likely secondary to ATN in the setting of sepsis and hypotension, and also from rhabdomyolysis   Anemia    Cellulitis    Chronic diastolic CHF (congestive heart failure) (HCC)    Depression    Diabetes mellitus (H[facility])    GERD (gastroesophageal reflux disease)    Hyperlipidemia    Hypertension    Hypothyroidism    Morbid obesity with BMI of 70 and over, adult ([place])    Obesity hypoventilation syndrome (HCC)    OSA (obstructive sleep apnea)    PVC's (premature ventricular contractions)    Recurrent cellulitis of lower leg    LLE, venous insuff   Sepsis ([place]) 11/2012   Secondary to cellulitis   Sleep apnea    Venous insufficiency of leg     PAST SURGICAL HISTORY: Past Surgical History:  Procedure Laterality Date   IR FLUORO GUIDE CV MIDLINE PICC RIGHT  03/28/2017   IR US GUIDE VASC ACCESS RIGHT  03/28/2017   WISDOM TOOTH EXTRACTION      SOCIAL HISTORY: Social History   Tobacco Use   Smoking status: Former Smoker    Years: 0.00   Smokeless tobacco: Never Used  Substance Use Topics   Alcohol use: No  Alcohol/week: 0.0 standard drinks   Drug use: No    FAMILY HISTORY: Family History  Problem Relation Age of Onset   Hypertension Mother    Diabetes Mother    High Cholesterol Mother    Thyroid disease Mother    Sleep apnea Mother    Obesity Mother    Hypertension Father    Heart disease Father        before age 64   High Cholesterol Father    Sleep apnea Father    Obesity Father     ROS: Review of Systems  Constitutional: Negative for weight loss.  Cardiovascular: Negative for chest pain.  Psychiatric/Behavioral: Positive for depression. Negative for suicidal ideas.    PHYSICAL EXAM: Blood pressure (!) 151/96, pulse 76, temperature 98.8 F (37.1 C), temperature source Oral, height 5\' 2"  (1.575 m), weight (!) 417 lb (189.1 kg), SpO2 97 %. Body mass index is 76.27 kg/m. Physical Exam Vitals signs reviewed.   Constitutional:      Appearance: Normal appearance. She is well-developed. She is obese.  Cardiovascular:     Rate and Rhythm: Normal rate.  Pulmonary:     Effort: Pulmonary effort is normal.  Musculoskeletal: Normal range of motion.  Skin:    General: Skin is warm and dry.  Neurological:     Mental Status: She is alert and oriented to person, place, and time.  Psychiatric:        Mood and Affect: Mood normal.        Behavior: Behavior normal.     RECENT LABS AND TESTS: BMET    Component Value Date/Time   NA 139 07/04/2019 1411   K 4.8 07/04/2019 1411   CL 99 07/04/2019 1411   CO2 24 07/04/2019 1411   GLUCOSE 89 07/04/2019 1411   GLUCOSE 117 (H) 03/23/2018 1224   BUN 22 (H) 07/04/2019 1411   CREATININE 0.83 07/04/2019 1411   CREATININE 1.06 11/14/2016 1050   CALCIUM 9.8 07/04/2019 1411   CALCIUM 9.6 06/26/2007 0000   GFRNONAA 89 07/04/2019 1411   GFRAA 103 07/04/2019 1411   Lab Results  Component Value Date   HGBA1C 5.6 07/04/2019   HGBA1C 5.4 12/04/2018   HGBA1C 5.8 (H) 07/25/2018   HGBA1C 6.5 03/23/2018   HGBA1C 6.6 (H) 03/28/2017   Lab Results  Component Value Date   INSULIN 23.5 07/04/2019   INSULIN 25.8 (H) 12/04/2018   INSULIN 24.7 07/25/2018   INSULIN 25.0 (H) 04/17/2018   CBC    Component Value Date/Time   WBC 5.9 07/25/2018 1234   WBC 7.9 03/23/2018 1224   RBC 5.07 07/25/2018 1234   RBC 5.65 (H) 03/23/2018 1224   HGB 14.8 07/25/2018 1234   HCT 44.8 07/25/2018 1234   PLT 230.0 03/23/2018 1224   MCV 88 07/25/2018 1234   MCH 29.2 07/25/2018 1234   MCH 26.8 04/19/2017 0419   MCHC 33.0 07/25/2018 1234   MCHC 31.6 03/23/2018 1224   RDW 15.6 (H) 07/25/2018 1234   LYMPHSABS 1.4 07/25/2018 1234   MONOABS 0.7 04/16/2017 2351   EOSABS 0.2 07/25/2018 1234   BASOSABS 0.0 07/25/2018 1234   Iron/TIBC/Ferritin/ %Sat No results found for: IRON, TIBC, FERRITIN, IRONPCTSAT Lipid Panel     Component Value Date/Time   CHOL 170 12/04/2018 0812   TRIG  174 (H) 12/04/2018 0812   HDL 43 12/04/2018 0812   CHOLHDL 4 03/23/2018 1224   VLDL 29.6 03/23/2018 1224   LDLCALC 92 12/04/2018 0812   LDLDIRECT 141.0 03/02/2016 1101  Hepatic Function Panel     Component Value Date/Time   PROT 7.3 07/04/2019 1411   ALBUMIN 4.2 07/04/2019 1411   AST 20 07/04/2019 1411   ALT 18 07/04/2019 1411   ALKPHOS 75 07/04/2019 1411   BILITOT 0.4 07/04/2019 1411   BILIDIR 0.1 08/30/2016 1413   IBILI 0.5 12/25/2012 0223      Component Value Date/Time   TSH 1.980 07/04/2019 1411   TSH 1.600 04/17/2018 1239   TSH 3.96 03/23/2018 1224     Ref. Range 07/04/2019 14:11  Vitamin D, 25-Hydroxy Latest Ref Range: 30.0 - 100.0 ng/mL 34.6    OBESITY BEHAVIORAL INTERVENTION VISIT  Today's visit was # 26  Starting weight: 468 lbs Starting date: 04/17/2018 Today's weight :  417 lbs Today's date: 09/25/2019 Total lbs lost to date: 51    09/25/2019  Height 5\' 2"  (1.575 m)  Weight 417 lb (189.1 kg) (A)  BMI (Calculated) 76.25  BLOOD PRESSURE - SYSTOLIC [id number]  BLOOD PRESSURE - DIASTOLIC 96   Body Fat % 69 %    ASK: We discussed the diagnosis of obesity with Duwayne Heck today and Layce agreed to give [place] permission to discuss obesity behavioral modification therapy today.  ASSESS: Kanyah has the diagnosis of obesity and her BMI today is 76.25 Lorean is in the action stage of change   ADVISE: Saanvi was educated on the multiple health risks of obesity as well as the benefit of weight loss to improve her health. She was advised of the need for long term treatment and the importance of lifestyle modifications to improve her current health and to decrease her risk of future health problems.  AGREE: Multiple dietary modification options and treatment options were discussed and  Kaeleen agreed to follow the recommendations documented in the above note.  ARRANGE: Amal was educated on the importance of frequent visits to treat obesity as outlined per CMS and USPSTF  guidelines and agreed to schedule her next follow up appointment today.  Corey Skains, am acting as transcriptionist for Abby Potash, PA-C I, Abby Potash, PA-C have reviewed above note and agree with its content

## 2019-09-29 ENCOUNTER — Other Ambulatory Visit: Payer: Self-pay | Admitting: Cardiology

## 2019-10-02 MED ORDER — LOSARTAN POTASSIUM 50 MG PO TABS: 50.0000 mg | ORAL_TABLET | Freq: Every day | ORAL | 0 refills | Status: DC

## 2019-10-02 NOTE — Addendum Note (Signed)
Addended by: Derl Barrow on: 10/02/2019 02:48 PM   Modules accepted: Orders

## 2019-10-16 ENCOUNTER — Ambulatory Visit (INDEPENDENT_AMBULATORY_CARE_PROVIDER_SITE_OTHER): Payer: BC Managed Care – PPO | Admitting: Physician Assistant

## 2019-10-16 ENCOUNTER — Encounter (INDEPENDENT_AMBULATORY_CARE_PROVIDER_SITE_OTHER): Payer: Self-pay | Admitting: Physician Assistant

## 2019-10-16 ENCOUNTER — Other Ambulatory Visit: Payer: Self-pay

## 2019-10-16 VITALS — BP 114/67 | HR 60 | Temp 99.1°F | Ht 62.0 in | Wt >= 6400 oz

## 2019-10-16 DIAGNOSIS — E559 Vitamin D deficiency, unspecified: Secondary | ICD-10-CM | POA: Diagnosis not present

## 2019-10-16 DIAGNOSIS — Z9189 Other specified personal risk factors, not elsewhere classified: Secondary | ICD-10-CM | POA: Diagnosis not present

## 2019-10-16 DIAGNOSIS — Z6841 Body Mass Index (BMI) 40.0 and over, adult: Secondary | ICD-10-CM

## 2019-10-16 DIAGNOSIS — F3289 Other specified depressive episodes: Secondary | ICD-10-CM

## 2019-10-16 MED ORDER — VORTIOXETINE HBR 10 MG PO TABS: 10.0000 mg | ORAL_TABLET | Freq: Every day | ORAL | 0 refills | Status: DC

## 2019-10-16 MED ORDER — VITAMIN D (ERGOCALCIFEROL) 1.25 MG (50000 UNIT) PO CAPS: ORAL_CAPSULE | ORAL | 0 refills | Status: DC

## 2019-10-20 NOTE — Progress Notes (Signed)
Office: [phone]  /  Fax: [phone]   HPI:   Chief Complaint: OBESITY Alexandra Beck is here to discuss her progress with her obesity treatment plan. She is on the Category 4 plan and is following her eating plan approximately 50 % of the time. She states she is exercising 0 minutes 0 times per week. [name] reports that her cat recently died and she has not been eating on the plan. She sometimes doesn't feel like eating. Her weight is (!) 424 lb (192.3 kg) today and has had a weight gain of 7 pounds over a period of 3 weeks since her last visit. She has lost 44 lbs since starting treatment with [place].  Vitamin D deficiency Alexandra Beck has a diagnosis of vitamin D deficiency. Alexandra Beck is currently taking vit D and she denies nausea, vomiting or muscle weakness.  At risk for osteopenia and osteoporosis Alexandra Beck is at higher risk of osteopenia and osteoporosis due to vitamin D deficiency.   Depression with emotional eating behaviors Alexandra Beck is not feeling better as of yet, but she just started Trintellix three weeks ago. Alexandra Beck struggles with emotional eating and using food for comfort to the extent that it is negatively impacting her health. She often snacks when she is not hungry. Alexandra Beck sometimes feels she is out of control and then feels guilty that she made poor food choices. She has been working on behavior modification techniques to help reduce her emotional eating and has been somewhat successful. She shows no sign of suicidal or homicidal ideations.  ASSESSMENT AND PLAN:  Vitamin D deficiency - Plan: Vitamin D, Ergocalciferol, (DRISDOL) 1.25 MG (50000 UT) CAPS capsule  Other depression,with emotional eating  - Plan: vortioxetine HBr (TRINTELLIX) 10 MG TABS tablet  At risk for osteoporosis  Class 3 severe obesity with serious comorbidity and body mass index (BMI) greater than or equal to 70 in adult, unspecified obesity type (H[facility])  PLAN:  Vitamin D Deficiency Alexandra Beck was informed that low vitamin D  levels contributes to fatigue and are associated with obesity, breast, and colon cancer. She agrees to continue to take prescription Vit D @50 ,000 IU every week and will follow up for routine testing of vitamin D, at least 2-3 times per year. She was informed of the risk of over-replacement of vitamin D and agrees to not increase her dose unless she discusses this with [place] first.  At risk for osteopenia and osteoporosis Alexandra Beck was given extended  (15 minutes) osteoporosis prevention counseling today. Alexandra Beck is at risk for osteopenia and osteoporosis due to her vitamin D deficiency. She was encouraged to take her vitamin D and follow her higher calcium diet and increase strengthening exercise to help strengthen her bones and decrease her risk of osteopenia and osteoporosis.  Depression with Emotional Eating Behaviors We discussed behavior modification techniques today to help Alexandra Beck deal with her emotional eating and depression. She has agreed to increase Trintellix to 10 mg daily #30 with no refills and follow up as directed.  Obesity Alexandra Beck is currently in the action stage of change. As such, her goal is to continue with weight loss efforts She has agreed to follow the Category 4 plan Alexandra Beck has been instructed to work up to a goal of 150 minutes of combined cardio and strengthening exercise per week for weight loss and overall health benefits. We discussed the following Behavioral Modification Strategies today: no skipping meals and increasing lean protein intake  Alexandra Beck has agreed to follow up with our clinic in 3 weeks.  She was informed of the importance of frequent follow up visits to maximize her success with intensive lifestyle modifications for her multiple health conditions.  ALLERGIES: Allergies  Allergen Reactions   Aspirin Hives and Swelling    REACTION: throat swelling, hives   Doxycycline Other (See Comments)    Abdominal pain   Lisinopril Cough   Niaspan [Niacin Er]     Caused  flushing    MEDICATIONS: Current Outpatient Medications on File Prior to Visit  Medication Sig Dispense Refill   amoxicillin (AMOXIL) 500 MG capsule TAKE 1 CAPSULE(500 MG) BY MOUTH TWICE DAILY 60 capsule 3   furosemide (LASIX) 80 MG tablet TAKE 1 TABLET(80 MG) BY MOUTH DAILY. Please make overdue appt with Dr. Meda Coffee before anymore refills. 2nd attempt 15 tablet 0   levothyroxine (SYNTHROID) 150 MCG tablet Take 1 tablet (150 mcg total) by mouth daily before breakfast. 90 tablet 3   losartan (COZAAR) 50 MG tablet Take 1 tablet (50 mg total) by mouth daily. Please make overdue appt with Dr. Meda Coffee before anymore refills. 1st attempt 30 tablet 0   metFORMIN (GLUCOPHAGE) 500 MG tablet Take 1 tablet (500 mg total) by mouth daily. 90 tablet 1   metoprolol succinate (TOPROL-XL) 50 MG 24 hr tablet Take 1 tablet (50 mg total) by mouth daily. Take with or immediately following a meal. 90 tablet 3   modafinil (PROVIGIL) 100 MG tablet TAKE 1 TABLET(100 MG) BY MOUTH THREE TIMES DAILY 90 tablet 3   mometasone (ELOCON) 0.1 % cream Apply 1 application topically daily. 45 g 0   potassium chloride SA (KLOR-CON) 20 MEQ tablet Take 2 tablets (40 mEq total) by mouth daily. Please make overdue appt with Dr. Meda Coffee before anymore refills. 1st attempt 60 tablet 0   rosuvastatin (CRESTOR) 10 MG tablet Take 1 tablet (10 mg total) by mouth daily. 90 tablet 1   traMADol (ULTRAM) 50 MG tablet Take 1-2 tablets (50-100 mg total) by mouth every 8 (eight) hours as needed. 20 tablet 0   No current facility-administered medications on file prior to visit.     PAST MEDICAL HISTORY: Past Medical History:  Diagnosis Date   Acute renal failure (ARF) ([place]) 11/2012    multifactorial-likely secondary to ATN in the setting of sepsis and hypotension, and also from rhabdomyolysis   Anemia    Cellulitis    Chronic diastolic CHF (congestive heart failure) ([facility])    Depression    Diabetes mellitus ([facility])    GERD  (gastroesophageal reflux disease)    Hyperlipidemia    Hypertension    Hypothyroidism    Morbid obesity with BMI of 70 and over, adult ([place])    Obesity hypoventilation syndrome (HCC)    OSA (obstructive sleep apnea)    PVC's (premature ventricular contractions)    Recurrent cellulitis of lower leg    LLE, venous insuff   Sepsis ([place]) 11/2012   Secondary to cellulitis   Sleep apnea    Venous insufficiency of leg     PAST SURGICAL HISTORY: Past Surgical History:  Procedure Laterality Date   IR FLUORO GUIDE CV MIDLINE PICC RIGHT  03/28/2017   IR US GUIDE VASC ACCESS RIGHT  03/28/2017   WISDOM TOOTH EXTRACTION      SOCIAL HISTORY: Social History   Tobacco Use   Smoking status: Former Smoker    Years: 0.00   Smokeless tobacco: Never Used  Substance Use Topics   Alcohol use: No    Alcohol/week: 0.0 standard drinks   Drug use:  No    FAMILY HISTORY: Family History  Problem Relation Age of Onset   Hypertension Mother    Diabetes Mother    High Cholesterol Mother    Thyroid disease Mother    Sleep apnea Mother    Obesity Mother    Hypertension Father    Heart disease Father        before age 15   High Cholesterol Father    Sleep apnea Father    Obesity Father     ROS: Review of Systems  Constitutional: Negative for weight loss.  Gastrointestinal: Negative for nausea and vomiting.  Musculoskeletal:       Negative for muscle weakness  Psychiatric/Behavioral: Positive for depression. Negative for suicidal ideas.    PHYSICAL EXAM: Blood pressure 114/67, pulse 60, temperature 99.1 F (37.3 C), height 5\' 2"  (1.575 m), weight (!) 424 lb (192.3 kg), last menstrual period 09/10/2019, SpO2 92 %. Body mass index is 77.55 kg/m. Physical Exam Vitals signs reviewed.  Constitutional:      Appearance: Normal appearance. She is well-developed. She is obese.  Cardiovascular:     Rate and Rhythm: Normal rate.  Pulmonary:     Effort: Pulmonary  effort is normal.  Musculoskeletal: Normal range of motion.  Skin:    General: Skin is warm and dry.  Neurological:     Mental Status: She is alert and oriented to person, place, and time.  Psychiatric:        Mood and Affect: Mood normal.        Behavior: Behavior normal.        Thought Content: Thought content does not include homicidal or suicidal ideation.     RECENT LABS AND TESTS: BMET    Component Value Date/Time   NA 139 07/04/2019 1411   K 4.8 07/04/2019 1411   CL 99 07/04/2019 1411   CO2 24 07/04/2019 1411   GLUCOSE 89 07/04/2019 1411   GLUCOSE 117 (H) 03/23/2018 1224   BUN 22 (H) 07/04/2019 1411   CREATININE 0.83 07/04/2019 1411   CREATININE 1.06 11/14/2016 1050   CALCIUM 9.8 07/04/2019 1411   CALCIUM 9.6 06/26/2007 0000   GFRNONAA 89 07/04/2019 1411   GFRAA 103 07/04/2019 1411   Lab Results  Component Value Date   HGBA1C 5.6 07/04/2019   HGBA1C 5.4 12/04/2018   HGBA1C 5.8 (H) 07/25/2018   HGBA1C 6.5 03/23/2018   HGBA1C 6.6 (H) 03/28/2017   Lab Results  Component Value Date   INSULIN 23.5 07/04/2019   INSULIN 25.8 (H) 12/04/2018   INSULIN 24.7 07/25/2018   INSULIN 25.0 (H) 04/17/2018   CBC    Component Value Date/Time   WBC 5.9 07/25/2018 1234   WBC 7.9 03/23/2018 1224   RBC 5.07 07/25/2018 1234   RBC 5.65 (H) 03/23/2018 1224   HGB 14.8 07/25/2018 1234   HCT 44.8 07/25/2018 1234   PLT 230.0 03/23/2018 1224   MCV 88 07/25/2018 1234   MCH 29.2 07/25/2018 1234   MCH 26.8 04/19/2017 0419   MCHC 33.0 07/25/2018 1234   MCHC 31.6 03/23/2018 1224   RDW 15.6 (H) 07/25/2018 1234   LYMPHSABS 1.4 07/25/2018 1234   MONOABS 0.7 04/16/2017 2351   EOSABS 0.2 07/25/2018 1234   BASOSABS 0.0 07/25/2018 1234   Iron/TIBC/Ferritin/ %Sat No results found for: IRON, TIBC, FERRITIN, IRONPCTSAT Lipid Panel     Component Value Date/Time   CHOL 170 12/04/2018 0812   TRIG 174 (H) 12/04/2018 0812   HDL 43 12/04/2018 [id number]  CHOLHDL 4 03/23/2018 1224   VLDL 29.6  03/23/2018 1224   LDLCALC 92 12/04/2018 0812   LDLDIRECT 141.0 03/02/2016 1101   Hepatic Function Panel     Component Value Date/Time   PROT 7.3 07/04/2019 1411   ALBUMIN 4.2 07/04/2019 1411   AST 20 07/04/2019 1411   ALT 18 07/04/2019 1411   ALKPHOS 75 07/04/2019 1411   BILITOT 0.4 07/04/2019 1411   BILIDIR 0.1 08/30/2016 1413   IBILI 0.5 12/25/2012 0223      Component Value Date/Time   TSH 1.980 07/04/2019 1411   TSH 1.600 04/17/2018 1239   TSH 3.96 03/23/2018 1224     Ref. Range 07/04/2019 14:11  Vitamin D, 25-Hydroxy Latest Ref Range: 30.0 - 100.0 ng/mL 34.6    OBESITY BEHAVIORAL INTERVENTION VISIT  Today's visit was # 27  Starting weight: 468 lbs Starting date: 04/17/2018 Today's weight : 424 lbs Today's date: 10/16/2019 Total lbs lost to date: 44    10/16/2019  Height 5\' 2"  (1.575 m)  Weight 424 lb (192.3 kg) (A)  BMI (Calculated) 77.53  BLOOD PRESSURE - SYSTOLIC [id number]  BLOOD PRESSURE - DIASTOLIC 67   Body Fat % [id number] %    ASK: We discussed the diagnosis of obesity with Duwayne Heck today and Esta agreed to give [place] permission to discuss obesity behavioral modification therapy today.  ASSESS: Diahanna has the diagnosis of obesity and her BMI today is 77.53 Krissy is in the action stage of change   ADVISE: Tobey was educated on the multiple health risks of obesity as well as the benefit of weight loss to improve her health. She was advised of the need for long term treatment and the importance of lifestyle modifications to improve her current health and to decrease her risk of future health problems.  AGREE: Multiple dietary modification options and treatment options were discussed and  Shawntay agreed to follow the recommendations documented in the above note.  ARRANGE: Kauri was educated on the importance of frequent visits to treat obesity as outlined per CMS and USPSTF guidelines and agreed to schedule her next follow up appointment today.  Corey Skains,  am acting as transcriptionist for Abby Potash, PA-C I, Abby Potash, PA-C have reviewed above note and agree with its content

## 2019-11-04 ENCOUNTER — Other Ambulatory Visit: Payer: Self-pay | Admitting: Internal Medicine

## 2019-11-04 ENCOUNTER — Other Ambulatory Visit: Payer: Self-pay | Admitting: Cardiology

## 2019-11-04 DIAGNOSIS — L03119 Cellulitis of unspecified part of limb: Secondary | ICD-10-CM

## 2019-11-04 DIAGNOSIS — I509 Heart failure, unspecified: Secondary | ICD-10-CM

## 2019-11-06 ENCOUNTER — Ambulatory Visit (INDEPENDENT_AMBULATORY_CARE_PROVIDER_SITE_OTHER): Payer: BC Managed Care – PPO | Admitting: Physician Assistant

## 2019-11-06 ENCOUNTER — Encounter (INDEPENDENT_AMBULATORY_CARE_PROVIDER_SITE_OTHER): Payer: Self-pay | Admitting: Physician Assistant

## 2019-11-06 ENCOUNTER — Other Ambulatory Visit: Payer: Self-pay

## 2019-11-06 VITALS — BP 158/67 | HR 72 | Temp 98.3°F | Ht 62.0 in | Wt >= 6400 oz

## 2019-11-06 DIAGNOSIS — F3289 Other specified depressive episodes: Secondary | ICD-10-CM | POA: Diagnosis not present

## 2019-11-06 DIAGNOSIS — Z9189 Other specified personal risk factors, not elsewhere classified: Secondary | ICD-10-CM | POA: Diagnosis not present

## 2019-11-06 DIAGNOSIS — Z6841 Body Mass Index (BMI) 40.0 and over, adult: Secondary | ICD-10-CM

## 2019-11-06 DIAGNOSIS — E559 Vitamin D deficiency, unspecified: Secondary | ICD-10-CM

## 2019-11-06 MED ORDER — VORTIOXETINE HBR 10 MG PO TABS: 10.0000 mg | ORAL_TABLET | Freq: Every day | ORAL | 0 refills | Status: DC

## 2019-11-06 MED ORDER — VITAMIN D (ERGOCALCIFEROL) 1.25 MG (50000 UNIT) PO CAPS: ORAL_CAPSULE | ORAL | 0 refills | Status: DC

## 2019-11-07 NOTE — Progress Notes (Signed)
Office: [phone]  /  Fax: [phone]   HPI:   Chief Complaint: OBESITY Alexandra Beck is here to discuss her progress with her obesity treatment plan. She is on the Category 4 plan and journaling dinner, and she is following her eating plan approximately 76 % of the time. She states she is exercising 0 minutes 0 times per week. [name] states that the first week after her last visit, she ate out very often. Due to her work schedule and lack of a regular routine, she is sometimes skipping her third meal. She is tearful today. Her weight is (!) 434 lb (196.9 kg) today and has had a weight gain of 10 pounds over a period of 3 weeks since her last visit. She has lost 34 lbs since starting treatment with [place].  Vitamin D deficiency [name] has a diagnosis of vitamin D deficiency. [name] is on vitamin D and she has no nausea, vomiting or muscle weakness.  At risk for osteopenia and osteoporosis [name] is at higher risk of osteopenia and osteoporosis due to vitamin D deficiency.   Depression with emotional eating behaviors [name] continues to report emotional eating. She struggles with emotional eating and using food for comfort to the extent that it is negatively impacting her health. She often snacks when she is not hungry. [name] sometimes feels she is out of control and then feels guilty that she made poor food choices. She has been working on behavior modification techniques to help reduce her emotional eating and has been somewhat successful. She shows no sign of suicidal or homicidal ideations.  ASSESSMENT AND PLAN:  Vitamin D deficiency - Plan: Vitamin D, Ergocalciferol, (DRISDOL) 1.25 MG (50000 UT) CAPS capsule  Other depression,with emotional eating  - Plan: vortioxetine HBr (TRINTELLIX) 10 MG TABS tablet  At risk for osteoporosis  Class 3 severe obesity with serious comorbidity and body mass index (BMI) greater than or equal to 70 in adult, unspecified obesity type (HCC)  PLAN:  Vitamin D  Deficiency Low vitamin D level contributes to fatigue and are associated with obesity, breast, and colon cancer. She agrees to continue to take prescription Vit D @50 ,000 IU every week and will follow up for routine testing of vitamin D, at least 2-3 times per year to avoid over-replacement.  At risk for osteopenia and osteoporosis [name] was given extended (15 minutes) osteoporosis prevention counseling today. [name] is at risk for osteopenia and osteoporosis due to her vitamin D deficiency. She was encouraged to take her vitamin D and follow her higher calcium diet and increase strengthening exercise to help strengthen her bones and decrease her risk of osteopenia and osteoporosis.  Emotional Eating Behaviors (other depression) Behavior modification techniques were discussed today to help [name] deal with her emotional/non-hunger eating behaviors. [name] agrees to continue Trintellix 10 mg daily #30 with no refills and follow up as directed. We will continue to follow and monitor her progress.  Obesity [name] is currently in the action stage of change. As such, her goal is to continue with weight loss efforts She has agreed to keep a food journal with 550 to 700 calories and 45 grams of protein daily at supper and follow the Category 4 plan [name] has been instructed to exercise, walking for a few minutes daily to start, for weight loss and overall health benefits. We discussed the following Behavioral Modification Strategies today: no skipping meals and work on meal planning and easy cooking plans  [name] has agreed to follow up with our clinic  in 3 to 4 weeks. She was informed of the importance of frequent follow up visits to maximize her success with intensive lifestyle modifications for her multiple health conditions.  ALLERGIES: Allergies  Allergen Reactions   Aspirin Hives and Swelling    REACTION: throat swelling, hives   Doxycycline Other (See Comments)    Abdominal pain   Lisinopril  Cough   Niaspan [Niacin Er]     Caused flushing    MEDICATIONS: Current Outpatient Medications on File Prior to Visit  Medication Sig Dispense Refill   amoxicillin (AMOXIL) 500 MG capsule TAKE 1 CAPSULE(500 MG) BY MOUTH TWICE DAILY 60 capsule 0   furosemide (LASIX) 80 MG tablet TAKE 1 TABLET(80 MG) BY MOUTH DAILY. Please make overdue appt with Dr. Meda Coffee before anymore refills. 2nd attempt 15 tablet 0   levothyroxine (SYNTHROID) 150 MCG tablet Take 1 tablet (150 mcg total) by mouth daily before breakfast. 90 tablet 3   losartan (COZAAR) 50 MG tablet Take 1 tablet (50 mg total) by mouth daily. Please make overdue appt with Dr. Meda Coffee before anymore refills. 1st attempt 30 tablet 0   metFORMIN (GLUCOPHAGE) 500 MG tablet Take 1 tablet (500 mg total) by mouth daily. 90 tablet 1   metoprolol succinate (TOPROL-XL) 50 MG 24 hr tablet Take 1 tablet (50 mg total) by mouth daily. Take with or immediately following a meal. 90 tablet 3   modafinil (PROVIGIL) 100 MG tablet TAKE 1 TABLET(100 MG) BY MOUTH THREE TIMES DAILY 90 tablet 3   mometasone (ELOCON) 0.1 % cream Apply 1 application topically daily. 45 g 0   potassium chloride SA (KLOR-CON) 20 MEQ tablet Take 2 tablets (40 mEq total) by mouth daily. Need appt for further refills, 1st attempt 60 tablet 0   rosuvastatin (CRESTOR) 10 MG tablet Take 1 tablet (10 mg total) by mouth daily. 90 tablet 1   traMADol (ULTRAM) 50 MG tablet Take 1-2 tablets (50-100 mg total) by mouth every 8 (eight) hours as needed. 20 tablet 0   No current facility-administered medications on file prior to visit.    PAST MEDICAL HISTORY: Past Medical History:  Diagnosis Date   Acute renal failure (ARF) ([place]) 11/2012    multifactorial-likely secondary to ATN in the setting of sepsis and hypotension, and also from rhabdomyolysis   Anemia    Cellulitis    Chronic diastolic CHF (congestive heart failure) (HCC)    Depression    Diabetes mellitus (HCC)     GERD (gastroesophageal reflux disease)    Hyperlipidemia    Hypertension    Hypothyroidism    Morbid obesity with BMI of 70 and over, adult ([place])    Obesity hypoventilation syndrome (HCC)    OSA (obstructive sleep apnea)    PVC's (premature ventricular contractions)    Recurrent cellulitis of lower leg    LLE, venous insuff   Sepsis ([place]) 11/2012   Secondary to cellulitis   Sleep apnea    Venous insufficiency of leg     PAST SURGICAL HISTORY: Past Surgical History:  Procedure Laterality Date   IR FLUORO GUIDE CV MIDLINE PICC RIGHT  03/28/2017   IR US GUIDE VASC ACCESS RIGHT  03/28/2017   WISDOM TOOTH EXTRACTION      SOCIAL HISTORY: Social History   Tobacco Use   Smoking status: Former Smoker    Years: 0.00   Smokeless tobacco: Never Used  Substance Use Topics   Alcohol use: No    Alcohol/week: 0.0 standard drinks   Drug use: No  FAMILY HISTORY: Family History  Problem Relation Age of Onset   Hypertension Mother    Diabetes Mother    High Cholesterol Mother    Thyroid disease Mother    Sleep apnea Mother    Obesity Mother    Hypertension Father    Heart disease Father        before age 70   High Cholesterol Father    Sleep apnea Father    Obesity Father     ROS: Review of Systems  Constitutional: Negative for weight loss.  Gastrointestinal: Negative for nausea and vomiting.  Musculoskeletal:       Negative for muscle weakness  Psychiatric/Behavioral: Positive for depression. Negative for suicidal ideas.    PHYSICAL EXAM: Blood pressure (!) 158/67, pulse 72, temperature 98.3 F (36.8 C), temperature source Oral, height 5\' 2"  (1.575 m), weight (!) 434 lb (196.9 kg), last menstrual period 10/23/2019, SpO2 94 %. Body mass index is 79.38 kg/m. Physical Exam Vitals reviewed.  Constitutional:      Appearance: Normal appearance. She is well-developed. She is obese.  Cardiovascular:     Rate and Rhythm: Normal rate.  Pulmonary:      Effort: Pulmonary effort is normal.  Musculoskeletal:        General: Normal range of motion.  Skin:    General: Skin is warm and dry.  Neurological:     Mental Status: She is alert and oriented to person, place, and time.  Psychiatric:        Mood and Affect: Mood normal.        Behavior: Behavior normal.     RECENT LABS AND TESTS: BMET    Component Value Date/Time   NA 139 07/04/2019 1411   K 4.8 07/04/2019 1411   CL 99 07/04/2019 1411   CO2 24 07/04/2019 1411   GLUCOSE 89 07/04/2019 1411   GLUCOSE 117 (H) 03/23/2018 1224   BUN 22 (H) 07/04/2019 1411   CREATININE 0.83 07/04/2019 1411   CREATININE 1.06 11/14/2016 1050   CALCIUM 9.8 07/04/2019 1411   CALCIUM 9.6 06/26/2007 0000   GFRNONAA 89 07/04/2019 1411   GFRAA 103 07/04/2019 1411   Lab Results  Component Value Date   HGBA1C 5.6 07/04/2019   HGBA1C 5.4 12/04/2018   HGBA1C 5.8 (H) 07/25/2018   HGBA1C 6.5 03/23/2018   HGBA1C 6.6 (H) 03/28/2017   Lab Results  Component Value Date   INSULIN 23.5 07/04/2019   INSULIN 25.8 (H) 12/04/2018   INSULIN 24.7 07/25/2018   INSULIN 25.0 (H) 04/17/2018   CBC    Component Value Date/Time   WBC 5.9 07/25/2018 1234   WBC 7.9 03/23/2018 1224   RBC 5.07 07/25/2018 1234   RBC 5.65 (H) 03/23/2018 1224   HGB 14.8 07/25/2018 1234   HCT 44.8 07/25/2018 1234   PLT 230.0 03/23/2018 1224   MCV 88 07/25/2018 1234   MCH 29.2 07/25/2018 1234   MCH 26.8 04/19/2017 0419   MCHC 33.0 07/25/2018 1234   MCHC 31.6 03/23/2018 1224   RDW 15.6 (H) 07/25/2018 1234   LYMPHSABS 1.4 07/25/2018 1234   MONOABS 0.7 04/16/2017 2351   EOSABS 0.2 07/25/2018 1234   BASOSABS 0.0 07/25/2018 1234   Iron/TIBC/Ferritin/ %Sat No results found for: IRON, TIBC, FERRITIN, IRONPCTSAT Lipid Panel     Component Value Date/Time   CHOL 170 12/04/2018 0812   TRIG 174 (H) 12/04/2018 0812   HDL 43 12/04/2018 0812   CHOLHDL 4 03/23/2018 1224   VLDL 29.6 03/23/2018 1224  LDLCALC 92 12/04/2018 0812    LDLDIRECT 141.0 03/02/2016 1101   Hepatic Function Panel     Component Value Date/Time   PROT 7.3 07/04/2019 1411   ALBUMIN 4.2 07/04/2019 1411   AST 20 07/04/2019 1411   ALT 18 07/04/2019 1411   ALKPHOS 75 07/04/2019 1411   BILITOT 0.4 07/04/2019 1411   BILIDIR 0.1 08/30/2016 1413   IBILI 0.5 12/25/2012 0223      Component Value Date/Time   TSH 1.980 07/04/2019 1411   TSH 1.600 04/17/2018 1239   TSH 3.96 03/23/2018 1224     Ref. Range 07/04/2019 14:11  Vitamin D, 25-Hydroxy Latest Ref Range: 30.0 - 100.0 ng/mL 34.6    OBESITY BEHAVIORAL INTERVENTION VISIT  Today's visit was # 28  Starting weight: 468 lbs Starting date: 04/17/2018 Today's weight : 434 lbs  Today's date: 11/06/2019 Total lbs lost to date: 34    11/06/2019  Height 5\' 2"  (1.575 m)  Weight 434 lb (196.9 kg) (A)  BMI (Calculated) 79.36  BLOOD PRESSURE - SYSTOLIC 0000000  BLOOD PRESSURE - DIASTOLIC 67   Body Fat % 123XX123 %    ASK: We discussed the diagnosis of obesity with Duwayne Heck today and Shaletta agreed to give [place] permission to discuss obesity behavioral modification therapy today.  ASSESS: Elwyn has the diagnosis of obesity and her BMI today is 79.36 Srinika is in the action stage of change   ADVISE: Araina was educated on the multiple health risks of obesity as well as the benefit of weight loss to improve her health. She was advised of the need for long term treatment and the importance of lifestyle modifications to improve her current health and to decrease her risk of future health problems.  AGREE: Multiple dietary modification options and treatment options were discussed and  Dalyn agreed to follow the recommendations documented in the above note.  ARRANGE: Nilsa was educated on the importance of frequent visits to treat obesity as outlined per CMS and USPSTF guidelines and agreed to schedule her next follow up appointment today.  Corey Skains, am acting as transcriptionist for Abby Potash,  PA-C I, Abby Potash, PA-C have reviewed above note and agree with its content

## 2019-11-11 ENCOUNTER — Other Ambulatory Visit: Payer: Self-pay | Admitting: Internal Medicine

## 2019-11-11 NOTE — Telephone Encounter (Signed)
error 

## 2019-11-12 ENCOUNTER — Telehealth: Payer: Self-pay | Admitting: Pulmonary Disease

## 2019-11-12 NOTE — Telephone Encounter (Signed)
Spoke with pt, she is wanting a refill on Modafinil. She stated Beth wa sthe last person to see her. Beth can you refill medication? Please advise.

## 2019-11-13 MED ORDER — MODAFINIL 100 MG PO TABS: ORAL_TABLET | ORAL | 3 refills | Status: DC

## 2019-11-13 NOTE — Telephone Encounter (Signed)
Dr. Elsworth Soho, based on the response received below from Derl Barrow, NP. Can you advise if the refill for Modafinil 100 mg can be sent to the pharmacy? Was last issued as a 90 dispense (taking 1 capsule TID). Patient had video visit with Beth on 06/06/19 and was last seen by you on 10/12/2018.  Called the pharmacy and confirmed the last prescription picked up was on 10/09/19. No refills remain.

## 2019-11-13 NOTE — Telephone Encounter (Signed)
No, Dr. Elsworth Soho will need to fill

## 2019-11-13 NOTE — Telephone Encounter (Signed)
Refill sent. In future, APP can also fill this prescription Please make 14-month follow-up appointment with me/APP

## 2019-11-13 NOTE — Telephone Encounter (Signed)
Spoke with the pt and notified of recs  6 month rov with RA was placed

## 2019-11-24 ENCOUNTER — Other Ambulatory Visit: Payer: Self-pay | Admitting: Cardiology

## 2019-11-25 ENCOUNTER — Encounter (INDEPENDENT_AMBULATORY_CARE_PROVIDER_SITE_OTHER): Payer: Self-pay | Admitting: Physician Assistant

## 2019-12-02 ENCOUNTER — Ambulatory Visit (INDEPENDENT_AMBULATORY_CARE_PROVIDER_SITE_OTHER): Payer: BC Managed Care – PPO | Admitting: Physician Assistant

## 2019-12-02 ENCOUNTER — Other Ambulatory Visit: Payer: Self-pay

## 2019-12-02 VITALS — BP 129/70 | HR 61 | Temp 98.6°F | Ht 62.0 in | Wt >= 6400 oz

## 2019-12-02 DIAGNOSIS — R7303 Prediabetes: Secondary | ICD-10-CM | POA: Diagnosis not present

## 2019-12-02 DIAGNOSIS — I10 Essential (primary) hypertension: Secondary | ICD-10-CM | POA: Diagnosis not present

## 2019-12-02 DIAGNOSIS — Z6841 Body Mass Index (BMI) 40.0 and over, adult: Secondary | ICD-10-CM

## 2019-12-02 DIAGNOSIS — Z9189 Other specified personal risk factors, not elsewhere classified: Secondary | ICD-10-CM

## 2019-12-02 MED ORDER — LOSARTAN POTASSIUM 50 MG PO TABS: 50.0000 mg | ORAL_TABLET | Freq: Every day | ORAL | 0 refills | Status: DC

## 2019-12-02 MED ORDER — FUROSEMIDE 80 MG PO TABS: ORAL_TABLET | ORAL | 0 refills | Status: DC

## 2019-12-03 NOTE — Progress Notes (Signed)
Office: [phone]  /  Fax: [phone]   HPI:  Chief Complaint: OBESITY Alexandra Beck is here to discuss her progress with her obesity treatment plan. She is on the Category 4 Plan and states she is following her eating plan approximately 85 % of the time. She states she is exercising 0 minutes 0 times per week. [name] did well with weight loss over the last few weeks. She is working hard to be creative with her meals, but stay on the plan. She is doing a better job with not skipping meals.  Hypertension [name] has a diagnosis of hypertension. She has no chest pain or shortness of breath. [name] reports that she retains fluid when she is off of lasix. [name] has not seen the cardiologist in the past year, but she agrees to go.  At risk for cardiovascular disease [name] is at a higher than average risk for cardiovascular disease due to obesity and hypertension.  Prediabetes [name] has a diagnosis of prediabetes. She has no polyphagia or hypoglycemia. [name] is currently on Metformin.  Today's visit was # 29 Starting weight: 468 lbs Starting date: 04/17/2018 Today's weight : 423 lbs Today's date: 12/02/2019 Total lbs lost to date: 45 Total lbs lost since last in-office visit: 11  ASSESSMENT AND PLAN:  Prediabetes  Essential hypertension - Plan: losartan (COZAAR) 50 MG tablet, furosemide (LASIX) 80 MG tablet  At risk for heart disease  Class 3 severe obesity with serious comorbidity and body mass index (BMI) greater than or equal to 70 in adult, unspecified obesity type ([place])  PLAN:  Hypertension [name] is working on healthy weight loss and exercise to improve blood pressure control. [name] agrees to continue Losartan 50 mg daily #30 with no refills and Furosemide 80 mg daily #30 with no refills and patient agrees to make appointment with the cardiologist today. We will watch for signs of hypotension as she continues her lifestyle modifications. [name] agrees to follow up with our clinic in 2  weeks.  Cardiovascular risk counseling [name] was given (~15 minutes) coronary artery disease prevention counseling today. She is 39 y.o. female and has risk factors for heart disease including obesity and hypertension. We discussed intensive lifestyle modifications today with an emphasis on specific weight loss instructions and strategies.   Prediabetes [name] will continue with weight loss and medications. She will continue to work on weight loss, exercise, and decreasing simple carbohydrates to help decrease the risk of diabetes.   Obesity [name] is currently in the action stage of change. As such, her goal is to continue with weight loss efforts. She has agreed to Category 4 Plan and keeping a food journal and adhering to recommended goals of 550 to 700 calories and 45 grams of protein at supper daily. [name] has been instructed to work up to a goal of 150 minutes of combined cardio and strengthening exercise per week for weight loss and overall health benefits. We discussed the following Behavioral Modification Strategies today: meal planning and cooking strategies and keeping healthy foods in the home.  [name] has agreed to follow-up with our clinic in 2 weeks. She was informed of the importance of frequent follow-up visits to maximize her success with intensive lifestyle modifications for her multiple health conditions.  ALLERGIES: Allergies  Allergen Reactions  . Aspirin Hives and Swelling    REACTION: throat swelling, hives  . Doxycycline Other (See Comments)    Abdominal pain  . Lisinopril Cough  . Niaspan [Niacin Er]     Caused flushing  MEDICATIONS: Current Outpatient Medications on File Prior to Visit  Medication Sig Dispense Refill  . amoxicillin (AMOXIL) 500 MG capsule TAKE 1 CAPSULE(500 MG) BY MOUTH TWICE DAILY 60 capsule 0  . levothyroxine (SYNTHROID) 150 MCG tablet Take 1 tablet (150 mcg total) by mouth daily before breakfast. 90 tablet 3  . metFORMIN (GLUCOPHAGE) 500  MG tablet Take 1 tablet (500 mg total) by mouth daily. 90 tablet 1  . metoprolol succinate (TOPROL-XL) 50 MG 24 hr tablet Take 1 tablet (50 mg total) by mouth daily. Take with or immediately following a meal. 90 tablet 3  . modafinil (PROVIGIL) 100 MG tablet TAKE 2 tabs in am & 1 tab around 5pm if needed 90 tablet 3  . potassium chloride SA (KLOR-CON) 20 MEQ tablet Take 2 tablets (40 mEq total) by mouth daily. Need appt for further refills, 1st attempt 60 tablet 0  . rosuvastatin (CRESTOR) 10 MG tablet Take 1 tablet (10 mg total) by mouth daily. 90 tablet 1  . traMADol (ULTRAM) 50 MG tablet Take 1-2 tablets (50-100 mg total) by mouth every 8 (eight) hours as needed. 20 tablet 0  . Vitamin D, Ergocalciferol, (DRISDOL) 1.25 MG (50000 UT) CAPS capsule TAKE 1 CAPSULE BY MOUTH EVERY 7 DAYS 4 capsule 0  . vortioxetine HBr (TRINTELLIX) 10 MG TABS tablet Take 1 tablet (10 mg total) by mouth daily. 30 tablet 0   No current facility-administered medications on file prior to visit.    PAST MEDICAL HISTORY: Past Medical History:  Diagnosis Date  . Acute renal failure (ARF) ([place]) 11/2012    multifactorial-likely secondary to ATN in the setting of sepsis and hypotension, and also from rhabdomyolysis  . Anemia   . Cellulitis   . Chronic diastolic CHF (congestive heart failure) ([place])   . Depression   . Diabetes mellitus ([place])   . GERD (gastroesophageal reflux disease)   . Hyperlipidemia   . Hypertension   . Hypothyroidism   . Morbid obesity with BMI of 70 and over, adult ([place])   . Obesity hypoventilation syndrome ([place])   . OSA (obstructive sleep apnea)   . PVC's (premature ventricular contractions)   . Recurrent cellulitis of lower leg    LLE, venous insuff  . Sepsis ([place]) 11/2012   Secondary to cellulitis  . Sleep apnea   . Venous insufficiency of leg     PAST SURGICAL HISTORY: Past Surgical History:  Procedure Laterality Date  . IR FLUORO GUIDE CV MIDLINE PICC RIGHT  03/28/2017  . IR US  GUIDE VASC ACCESS RIGHT  03/28/2017  . WISDOM TOOTH EXTRACTION      SOCIAL HISTORY: Social History   Tobacco Use  . Smoking status: Former Smoker    Years: 0.00  . Smokeless tobacco: Never Used  Substance Use Topics  . Alcohol use: No    Alcohol/week: 0.0 standard drinks  . Drug use: No    FAMILY HISTORY: Family History  Problem Relation Age of Onset  . Hypertension Mother   . Diabetes Mother   . High Cholesterol Mother   . Thyroid disease Mother   . Sleep apnea Mother   . Obesity Mother   . Hypertension Father   . Heart disease Father        before age 1  . High Cholesterol Father   . Sleep apnea Father   . Obesity Father     ROS: Review of Systems  Constitutional: Positive for weight loss.  Respiratory: Negative for shortness of breath.  Cardiovascular: Negative for chest pain.  Endo/Heme/Allergies:       Negative for polyphagia Negative for hypoglycemia    PHYSICAL EXAM: Blood pressure 129/70, pulse 61, temperature 98.6 F (37 C), temperature source Oral, height 5\' 2"  (1.575 m), weight (!) 423 lb (191.9 kg), SpO2 92 %. Body mass index is 77.37 kg/m. Physical Exam Vitals reviewed.  Constitutional:      General: She is not in acute distress.    Appearance: Normal appearance. She is well-developed. She is obese.  Pulmonary:     Effort: Pulmonary effort is normal.  Neurological:     Mental Status: She is alert and oriented to person, place, and time.  Psychiatric:        Mood and Affect: Mood normal.        Behavior: Behavior normal.     RECENT LABS AND TESTS: BMET    Component Value Date/Time   NA 139 07/04/2019 1411   K 4.8 07/04/2019 1411   CL 99 07/04/2019 1411   CO2 24 07/04/2019 1411   GLUCOSE 89 07/04/2019 1411   GLUCOSE 117 (H) 03/23/2018 1224   BUN 22 (H) 07/04/2019 1411   CREATININE 0.83 07/04/2019 1411   CREATININE 1.06 11/14/2016 1050   CALCIUM 9.8 07/04/2019 1411   CALCIUM 9.6 06/26/2007 0000   GFRNONAA 89 07/04/2019 1411    GFRAA 103 07/04/2019 1411   Lab Results  Component Value Date   HGBA1C 5.6 07/04/2019   HGBA1C 5.4 12/04/2018   HGBA1C 5.8 (H) 07/25/2018   HGBA1C 6.5 03/23/2018   HGBA1C 6.6 (H) 03/28/2017   Lab Results  Component Value Date   INSULIN 23.5 07/04/2019   INSULIN 25.8 (H) 12/04/2018   INSULIN 24.7 07/25/2018   INSULIN 25.0 (H) 04/17/2018   CBC    Component Value Date/Time   WBC 5.9 07/25/2018 1234   WBC 7.9 03/23/2018 1224   RBC 5.07 07/25/2018 1234   RBC 5.65 (H) 03/23/2018 1224   HGB 14.8 07/25/2018 1234   HCT 44.8 07/25/2018 1234   PLT 230.0 03/23/2018 1224   MCV 88 07/25/2018 1234   MCH 29.2 07/25/2018 1234   MCH 26.8 04/19/2017 0419   MCHC 33.0 07/25/2018 1234   MCHC 31.6 03/23/2018 1224   RDW 15.6 (H) 07/25/2018 1234   LYMPHSABS 1.4 07/25/2018 1234   MONOABS 0.7 04/16/2017 2351   EOSABS 0.2 07/25/2018 1234   BASOSABS 0.0 07/25/2018 1234   Iron/TIBC/Ferritin/ %Sat No results found for: IRON, TIBC, FERRITIN, IRONPCTSAT Lipid Panel     Component Value Date/Time   CHOL 170 12/04/2018 0812   TRIG 174 (H) 12/04/2018 0812   HDL 43 12/04/2018 0812   CHOLHDL 4 03/23/2018 1224   VLDL 29.6 03/23/2018 1224   LDLCALC 92 12/04/2018 0812   LDLDIRECT 141.0 03/02/2016 1101   Hepatic Function Panel     Component Value Date/Time   PROT 7.3 07/04/2019 1411   ALBUMIN 4.2 07/04/2019 1411   AST 20 07/04/2019 1411   ALT 18 07/04/2019 1411   ALKPHOS 75 07/04/2019 1411   BILITOT 0.4 07/04/2019 1411   BILIDIR 0.1 08/30/2016 1413   IBILI 0.5 12/25/2012 0223      Component Value Date/Time   TSH 1.980 07/04/2019 1411   TSH 1.600 04/17/2018 1239   TSH 3.96 03/23/2018 1224    Ref. Range 07/04/2019 14:11  Vitamin D, 25-Hydroxy Latest Ref Range: 30.0 - 100.0 ng/mL 34.6    I, Doreene Nest, am acting as Location manager for [employer], PA-C.

## 2019-12-07 ENCOUNTER — Other Ambulatory Visit: Payer: Self-pay | Admitting: Cardiology

## 2019-12-07 ENCOUNTER — Other Ambulatory Visit (INDEPENDENT_AMBULATORY_CARE_PROVIDER_SITE_OTHER): Payer: Self-pay | Admitting: Physician Assistant

## 2019-12-07 ENCOUNTER — Other Ambulatory Visit: Payer: Self-pay | Admitting: Internal Medicine

## 2019-12-07 DIAGNOSIS — I509 Heart failure, unspecified: Secondary | ICD-10-CM

## 2019-12-07 DIAGNOSIS — F3289 Other specified depressive episodes: Secondary | ICD-10-CM

## 2019-12-07 DIAGNOSIS — E559 Vitamin D deficiency, unspecified: Secondary | ICD-10-CM

## 2019-12-07 DIAGNOSIS — L03119 Cellulitis of unspecified part of limb: Secondary | ICD-10-CM

## 2019-12-09 ENCOUNTER — Encounter (INDEPENDENT_AMBULATORY_CARE_PROVIDER_SITE_OTHER): Payer: Self-pay

## 2019-12-10 MED ORDER — AMOXICILLIN 500 MG PO CAPS: ORAL_CAPSULE | ORAL | 0 refills | Status: DC

## 2019-12-10 NOTE — Addendum Note (Signed)
Addended by: Eugenia Mcalpine on: 12/10/2019 09:23 AM   Modules accepted: Orders

## 2019-12-17 ENCOUNTER — Ambulatory Visit (INDEPENDENT_AMBULATORY_CARE_PROVIDER_SITE_OTHER): Payer: BC Managed Care – PPO | Admitting: Physician Assistant

## 2019-12-17 ENCOUNTER — Other Ambulatory Visit: Payer: Self-pay

## 2019-12-17 ENCOUNTER — Encounter (INDEPENDENT_AMBULATORY_CARE_PROVIDER_SITE_OTHER): Payer: Self-pay | Admitting: Physician Assistant

## 2019-12-17 VITALS — BP 155/81 | HR 71 | Temp 98.6°F | Ht 62.0 in | Wt >= 6400 oz

## 2019-12-17 DIAGNOSIS — Z9189 Other specified personal risk factors, not elsewhere classified: Secondary | ICD-10-CM | POA: Diagnosis not present

## 2019-12-17 DIAGNOSIS — E7849 Other hyperlipidemia: Secondary | ICD-10-CM

## 2019-12-17 DIAGNOSIS — E559 Vitamin D deficiency, unspecified: Secondary | ICD-10-CM | POA: Diagnosis not present

## 2019-12-17 DIAGNOSIS — Z6841 Body Mass Index (BMI) 40.0 and over, adult: Secondary | ICD-10-CM

## 2019-12-17 DIAGNOSIS — R7303 Prediabetes: Secondary | ICD-10-CM

## 2019-12-17 MED ORDER — VITAMIN D (ERGOCALCIFEROL) 1.25 MG (50000 UNIT) PO CAPS: ORAL_CAPSULE | ORAL | 0 refills | Status: DC

## 2019-12-17 NOTE — Progress Notes (Signed)
Chief Complaint:   OBESITY [name] is here to discuss her progress with her obesity treatment plan along with follow-up of her obesity related diagnoses. Alexandra Beck is on the Category 4 Plan and keeping a food journal of 550 to 700 calories and 45 grams of protein at supper daily and states she is following her eating plan approximately 85% of the time. [name] states she is exercising 0 minutes 0 times per week.  Today's visit was #: 13 Starting weight: 468 lbs Starting date: 04/17/2018 Today's weight: 426 lbs Today's date: 12/17/2019 Total lbs lost to date: 42 Total lbs lost since last in-office visit: 0  Interim History: Alexandra Beck reports an increase in cravings this week, due to her menstrual cycle. She has been eating more candy than usual.  Subjective:   Vitamin D deficiency  Domino's last Vitamin D level was 34.6 on 07/04/19. She is currently taking weekly vit D. She denies nausea, vomiting or muscle weakness.  Prediabetes  Alexandra Beck has a diagnosis of prediabetes based on her elevated Hgb A1c and was informed this puts her at greater risk of developing diabetes. Her last A1c was 5.6 (07/04/19). She is on metformin and she denies nausea, vomiting or diarrhea. [name] has no polyphagia. She continues to work on diet and exercise to decrease her risk of diabetes.   Lab Results  Component Value Date   HGBA1C 5.6 07/04/2019   Lab Results  Component Value Date   INSULIN 23.5 07/04/2019   INSULIN 25.8 (H) 12/04/2018   INSULIN 24.7 07/25/2018   INSULIN 25.0 (H) 04/17/2018   Other hyperlipidemia  Alexandra Beck has hyperlipidemia and she is not on medications. She has been trying to improve her cholesterol levels with intensive lifestyle modification including a low saturated fat diet and weight loss. She is not exercising. She denies any chest pain or myalgias. Alexandra Beck is due for labs.  Lab Results  Component Value Date   ALT 18 07/04/2019   AST 20 07/04/2019   ALKPHOS 75 07/04/2019   BILITOT  0.4 07/04/2019   Lab Results  Component Value Date   CHOL 170 12/04/2018   HDL 43 12/04/2018   LDLCALC 92 12/04/2018   LDLDIRECT 141.0 03/02/2016   TRIG 174 (H) 12/04/2018   CHOLHDL 4 03/23/2018   At risk for osteoporosis Alexandra Beck is at higher risk of osteopenia and osteoporosis due to Vitamin D deficiency.   Assessment/Plan:   Vitamin D deficiency Low Vitamin D level contributes to fatigue and are associated with obesity, breast, and colon cancer. Alexandra Beck agrees to continue to take prescription Vitamin D @50 ,000 IU every week #4 with no refills and we will check labs today. Alexandra Beck will follow-up for routine testing of Vitamin D, at least 2-3 times per year to avoid over-replacement.  Prediabetes  [name] will continue with metformin and weight loss. She will continue to work on weight loss, exercise, and decreasing simple carbohydrates to help decrease the risk of diabetes.   Other hyperlipidemia  Cardiovascular risk and specific lipid/LDL goals reviewed.  We discussed several lifestyle modifications today and [name] will continue to work on diet and weight loss efforts. We will check labs today. Orders and follow up as documented in patient record.   Counseling Intensive lifestyle modifications are the first line treatment for this issue. . Dietary changes: Increase soluble fiber. Decrease simple carbohydrates. . Exercise changes: Moderate to vigorous-intensity aerobic activity 150 minutes per week if tolerated. . Lipid-lowering medications: see documented in medical record.  At  risk for osteoporosis Alexandra Beck was given approximately 15 minutes of osteoporosis prevention counseling today. Alexandra Beck is at risk for osteopenia and osteoporosis due to her Vitamin D deficiency. She was encouraged to take her Vitamin D and follow her higher calcium diet and increase strengthening exercise to help strengthen her bones and decrease her risk of osteopenia and osteoporosis.  Repetitive spaced learning was  employed today to elicit superior memory formation and behavioral change.  Obesity Alexandra Beck is currently in the action stage of change. As such, her goal is to continue with weight loss efforts. She has agreed to the Category 4 Plan and keeping a food journal and adhering to recommended goals of 550 to 700 calories and 45 grams of protein at supper daily.   Exercise goals: For substantial health benefits, adults should do at least 150 minutes (2 hours and 30 minutes) a week of moderate-intensity, or 75 minutes (1 hour and 15 minutes) a week of vigorous-intensity aerobic physical activity, or an equivalent combination of moderate- and vigorous-intensity aerobic activity. Aerobic activity should be performed in episodes of at least 10 minutes, and preferably, it should be spread throughout the week. Adults should also include muscle-strengthening activities that involve all major muscle groups on 2 or more days a week.  Behavioral modification strategies: meal planning and cooking strategies and keeping healthy foods in the home.  Alexandra Beck has agreed to follow-up with our clinic in 2 weeks. She was informed of the importance of frequent follow-up visits to maximize her success with intensive lifestyle modifications for her multiple health conditions.   Alexandra Beck was informed we would discuss her lab results at her next visit unless there is a critical issue that needs to be addressed sooner. Alexandra Beck agreed to keep her next visit at the agreed upon time to discuss these results.  Objective:   Blood pressure (!) 155/81, pulse 71, temperature 98.6 F (37 C), temperature source Oral, height 5\' 2"  (1.575 m), weight (!) 426 lb (193.2 kg), SpO2 95 %. Body mass index is 77.92 kg/m.  General: Cooperative, alert, well developed, in no acute distress. HEENT: Conjunctivae and lids unremarkable. Cardiovascular: Regular rhythm.  Lungs: Normal work of breathing. Neurologic: No focal deficits.   Lab Results  Component  Value Date   CREATININE 0.83 07/04/2019   BUN 22 (H) 07/04/2019   NA 139 07/04/2019   K 4.8 07/04/2019   CL 99 07/04/2019   CO2 24 07/04/2019   Lab Results  Component Value Date   ALT 18 07/04/2019   AST 20 07/04/2019   ALKPHOS 75 07/04/2019   BILITOT 0.4 07/04/2019   Lab Results  Component Value Date   HGBA1C 5.6 07/04/2019   HGBA1C 5.4 12/04/2018   HGBA1C 5.8 (H) 07/25/2018   HGBA1C 6.5 03/23/2018   HGBA1C 6.6 (H) 03/28/2017   Lab Results  Component Value Date   INSULIN 23.5 07/04/2019   INSULIN 25.8 (H) 12/04/2018   INSULIN 24.7 07/25/2018   INSULIN 25.0 (H) 04/17/2018   Lab Results  Component Value Date   TSH 1.980 07/04/2019   Lab Results  Component Value Date   CHOL 170 12/04/2018   HDL 43 12/04/2018   LDLCALC 92 12/04/2018   LDLDIRECT 141.0 03/02/2016   TRIG 174 (H) 12/04/2018   CHOLHDL 4 03/23/2018   Lab Results  Component Value Date   WBC 5.9 07/25/2018   HGB 14.8 07/25/2018   HCT 44.8 07/25/2018   MCV 88 07/25/2018   PLT 230.0 03/23/2018   No results found  for: IRON, TIBC, FERRITIN   Ref. Range 07/04/2019 14:11  Vitamin D, 25-Hydroxy Latest Ref Range: 30.0 - 100.0 ng/mL 34.6    Attestation Statements:   Reviewed by clinician on day of visit: allergies, medications, problem list, medical history, surgical history, family history, social history, and previous encounter notes.  Corey Skains, am acting as Location manager for [employer], PA-C.  I have reviewed the above documentation for accuracy and completeness, and I agree with the above. Abby Potash, PA-C

## 2019-12-18 LAB — LIPID PANEL WITH LDL/HDL RATIO
Cholesterol, Total: 194 mg/dL (ref 100–199)
HDL: 47 mg/dL (ref >39)
LDL Chol Calc (NIH): 106 mg/dL — ABNORMAL HIGH (ref 0–99)
LDL/HDL Ratio: 2.3 ratio (ref 0.0–3.2)
Triglycerides: 240 mg/dL — ABNORMAL HIGH (ref 0–149)
VLDL Cholesterol Cal: 41 mg/dL — ABNORMAL HIGH (ref 5–40)

## 2019-12-18 LAB — COMPREHENSIVE METABOLIC PANEL
ALT: 18 IU/L (ref 0–32)
AST: 16 IU/L (ref 0–40)
Albumin/Globulin Ratio: 1.5 (ref 1.2–2.2)
Albumin: 4.1 g/dL (ref 3.8–4.8)
Alkaline Phosphatase: 82 IU/L (ref 39–117)
BUN/Creatinine Ratio: 27 — ABNORMAL HIGH (ref 9–23)
BUN: 20 mg/dL (ref 6–20)
Bilirubin Total: 0.3 mg/dL (ref 0.0–1.2)
CO2: 26 mmol/L (ref 20–29)
Calcium: 9.7 mg/dL (ref 8.7–10.2)
Chloride: 103 mmol/L (ref 96–106)
Creatinine, Ser: 0.75 mg/dL (ref 0.57–1.00)
GFR calc Af Amer: 116 mL/min/{1.73_m2} (ref >59)
GFR calc non Af Amer: 101 mL/min/{1.73_m2} (ref >59)
Globulin, Total: 2.8 g/dL (ref 1.5–4.5)
Glucose: 101 mg/dL — ABNORMAL HIGH (ref 65–99)
Potassium: 4.3 mmol/L (ref 3.5–5.2)
Sodium: 142 mmol/L (ref 134–144)
Total Protein: 6.9 g/dL (ref 6.0–8.5)

## 2019-12-18 LAB — HEMOGLOBIN A1C
Est. average glucose Bld gHb Est-mCnc: 114 mg/dL
Hgb A1c MFr Bld: 5.6 % (ref 4.8–5.6)

## 2019-12-18 LAB — INSULIN, RANDOM: INSULIN: 17.1 u[IU]/mL (ref 2.6–24.9)

## 2019-12-18 LAB — VITAMIN D 25 HYDROXY (VIT D DEFICIENCY, FRACTURES): Vit D, 25-Hydroxy: 38.2 ng/mL (ref 30.0–100.0)

## 2019-12-19 ENCOUNTER — Telehealth: Payer: Self-pay

## 2019-12-19 NOTE — Telephone Encounter (Signed)
Attempted to contact pt to obtain consent and verify medications, allergies, and pharmacy.

## 2019-12-24 ENCOUNTER — Other Ambulatory Visit: Payer: Self-pay

## 2019-12-24 ENCOUNTER — Telehealth: Payer: BC Managed Care – PPO | Admitting: Cardiology

## 2019-12-25 ENCOUNTER — Encounter: Payer: Self-pay | Admitting: *Deleted

## 2019-12-25 ENCOUNTER — Telehealth: Payer: Self-pay | Admitting: *Deleted

## 2019-12-25 ENCOUNTER — Other Ambulatory Visit: Payer: Self-pay

## 2019-12-25 ENCOUNTER — Encounter: Payer: Self-pay | Admitting: Cardiology

## 2019-12-25 ENCOUNTER — Telehealth (INDEPENDENT_AMBULATORY_CARE_PROVIDER_SITE_OTHER): Payer: BC Managed Care – PPO | Admitting: Cardiology

## 2019-12-25 DIAGNOSIS — E7849 Other hyperlipidemia: Secondary | ICD-10-CM

## 2019-12-25 DIAGNOSIS — I5032 Chronic diastolic (congestive) heart failure: Secondary | ICD-10-CM | POA: Diagnosis not present

## 2019-12-25 DIAGNOSIS — Z6841 Body Mass Index (BMI) 40.0 and over, adult: Secondary | ICD-10-CM

## 2019-12-25 DIAGNOSIS — I10 Essential (primary) hypertension: Secondary | ICD-10-CM

## 2019-12-25 MED ORDER — FISH OIL 1000 MG PO CAPS: 1000.0000 mg | ORAL_CAPSULE | Freq: Every day | ORAL | 6 refills | Status: DC

## 2019-12-25 NOTE — Progress Notes (Signed)
Virtual Visit via Video Note   This visit type was conducted due to national recommendations for restrictions regarding the COVID-19 Pandemic (e.g. social distancing) in an effort to limit this patient's exposure and mitigate transmission in our community.  Due to her co-morbid illnesses, this patient is at least at moderate risk for complications without adequate follow up.  This format is felt to be most appropriate for this patient at this time.  All issues noted in this document were discussed and addressed.  A limited physical exam was performed with this format.  Please refer to the patient's chart for her consent to telehealth for [facility].   Date:  12/25/2019   ID:  JOHNSON TERRANA, DOB 06-23-1981, MRN [id number]  Patient Location: Home Provider Location: Office  PCP:  Marrian Salvage, [place]  Cardiologist:  Ena Dawley, MD  Electrophysiologist:  None   Evaluation Performed:  Follow-Up Visit  Reason for visit: 1 year follow-up  History of Present Illness:    Alexandra Beck is a 39 y.o. female with history of extreme morbid obesity, OHS/OSA, physical inactivity, hypertension, hyperlipidemia, hypothyroidism, anemia, DM, hypothyroidism, GERD, recurrent cellulitis, venous insufficiency, chronic diastolic CHF, PVCs on EKG, acute renal failure in 2014 who presents for follow-up of CHF and abnormal EKG. 2D echo 09/15/16: mild LVH, EF 55%, no RWMA, trivial AI, poor image quality. She has been followed over the fall for volume overload, ultimately achieving improvement in symptoms with combination of Lasix and metolazone. She was seen in clinic in followup 2 weeks ago for reassessment at which time labs showed BUN 34, Cr 1.13, potassium 3.2, magnesium 1.8. She had been taking metolazone every other day - based on labs, she was instructed to decrease metolazone to twice weekly and increase her potassium on metolazone days. Labs otherwise notable in 08/2016 for albumin 3.4, BNP 641, WBC  12.2, Hgb 12.4. UA 07/2016 with 100 protein. She was feeling well but EKG demonstrated frequent PVCs and more notable TW changes compared to prior (diffuse TWI I, II, possibly avF, V2-V6, TW flattening avL, III). These changes were seen when she was admitted in 2014, but new compared to 2015 & 2017. Per review with Dr. Meda Coffee, it was unclear if these were related to electrolyte deficiency, LVH, or ischemia, thus she was brought back for recheck today.  06/01/2018 -patient is coming after 6 months, she has joint a weight loss group and she has been able to lose 26 pounds so far with some improvement in symptoms.  She still gets short of breath when walking longer distances.  She is motivated to lose more weight, she has also changed her diet with counting calories.  She is right now not interested in bariatric surgery, she has noticed her blood pressure has been elevated, she has stable lower extremity edema controlled by Lasix 80 mg daily, twice a day Lasix was very inconvenient because of frequent urination during the night.  She otherwise has occasional paroxysmal nocturnal dyspnea.  She denies any chest pain palpitations or syncope.  12/25/2019 - 1 year follow up, the patient has been doing well, she works from home, admits to not doing any physical activity, she continues with her weight loss program, she was originally very successful and went down from 468pounds to 380 pounds however with Covid she was unable to visit her program and bounced back to 430 pounds.  She is motivated to restart again she has changed her diet significantly.  She denies any chest pain,  shortness of breath, no orthopnea proximal nocturnal dyspnea.  She now uses oxygen with her CPAP machine.  The patient does not have symptoms concerning for COVID-19 infection (fever, chills, cough, or new shortness of breath).    Past Medical History:  Diagnosis Date  . Acute renal failure (ARF) ([place]) 11/2012    multifactorial-likely secondary  to ATN in the setting of sepsis and hypotension, and also from rhabdomyolysis  . Anemia   . Cellulitis   . Chronic diastolic CHF (congestive heart failure) ([place])   . Depression   . Diabetes mellitus ([place])   . GERD (gastroesophageal reflux disease)   . Hyperlipidemia   . Hypertension   . Hypothyroidism   . Morbid obesity with BMI of 70 and over, adult ([place])   . Obesity hypoventilation syndrome ([place])   . OSA (obstructive sleep apnea)   . PVC's (premature ventricular contractions)   . Recurrent cellulitis of lower leg    LLE, venous insuff  . Sepsis ([place]) 11/2012   Secondary to cellulitis  . Sleep apnea   . Venous insufficiency of leg    Past Surgical History:  Procedure Laterality Date  . IR FLUORO GUIDE CV MIDLINE PICC RIGHT  03/28/2017  . IR US GUIDE VASC ACCESS RIGHT  03/28/2017  . WISDOM TOOTH EXTRACTION       Current Meds  Medication Sig  . amoxicillin (AMOXIL) 500 MG capsule TAKE 1 CAPSULE(500 MG) BY MOUTH TWICE DAILY  . furosemide (LASIX) 80 MG tablet TAKE 1 TABLET BY MOUTH DAILY  . levothyroxine (SYNTHROID) 150 MCG tablet Take 1 tablet (150 mcg total) by mouth daily before breakfast.  . losartan (COZAAR) 50 MG tablet Take 1 tablet (50 mg total) by mouth daily.  . metFORMIN (GLUCOPHAGE) 500 MG tablet Take 1 tablet (500 mg total) by mouth daily.  . metoprolol succinate (TOPROL-XL) 50 MG 24 hr tablet Take 1 tablet (50 mg total) by mouth daily. Take with or immediately following a meal.  . modafinil (PROVIGIL) 100 MG tablet Take 300 mg by mouth every morning.  . potassium chloride SA (KLOR-CON) 20 MEQ tablet Take 2 tablets (40 mEq total) by mouth daily. Please keep upcoming appt with Dr. Meda Coffee in January before anymore refills. Thank you  . rosuvastatin (CRESTOR) 10 MG tablet Take 1 tablet (10 mg total) by mouth daily.  . traMADol (ULTRAM) 50 MG tablet Take 1-2 tablets (50-100 mg total) by mouth every 8 (eight) hours as needed.  . TRINTELLIX 10 MG TABS tablet TAKE 1 TABLET(10  MG) BY MOUTH DAILY  . Vitamin D, Ergocalciferol, (DRISDOL) 1.25 MG (50000 UNIT) CAPS capsule TAKE 1 CAPSULE BY MOUTH EVERY 7 DAYS     Allergies:   Aspirin, Doxycycline, Lisinopril, and Niaspan [niacin er]   Social History   Tobacco Use  . Smoking status: Former Smoker    Years: 0.00  . Smokeless tobacco: Never Used  Substance Use Topics  . Alcohol use: No    Alcohol/week: 0.0 standard drinks  . Drug use: No     Family Hx: The patient's family history includes Diabetes in her mother; Heart disease in her father; High Cholesterol in her father and mother; Hypertension in her father and mother; Obesity in her father and mother; Sleep apnea in her father and mother; Thyroid disease in her mother.  ROS:   Please see the history of present illness.    All other systems reviewed and are negative.   Prior CV studies:   The following studies were reviewed  today:  Labs/Other Tests and Data Reviewed:    EKG:  No ECG reviewed.  Recent Labs: 07/04/2019: TSH 1.980 12/17/2019: ALT 18; BUN 20; Creatinine, Ser 0.75; Potassium 4.3; Sodium 142   Recent Lipid Panel Lab Results  Component Value Date/Time   CHOL 194 12/17/2019 02:39 PM   TRIG 240 (H) 12/17/2019 02:39 PM   HDL 47 12/17/2019 02:39 PM   CHOLHDL 4 03/23/2018 12:24 PM   LDLCALC 106 (H) 12/17/2019 02:39 PM   LDLDIRECT 141.0 03/02/2016 11:01 AM   Wt Readings from Last 3 Encounters:  12/02/19 (!) 426 lb (193.2 kg)  12/17/19 (!) 426 lb (193.2 kg)  12/02/19 (!) 423 lb (191.9 kg)    Objective:    Vital Signs:  BP (!) 155/81   Pulse 60   Ht 5\' 2"  (1.575 m)   Wt (!) 426 lb (193.2 kg)   BMI 77.92 kg/m    VITAL SIGNS:  reviewed   ASSESSMENT & PLAN:    1. Chronic combined systolic and diastolic CHF, her most recent echocardiogram showed LVEF 50 to 55%, continue Lasix and metoprolol, her most recent labs 2 days ago showed normal electrolytes and kidney function. 2. Hypertension -she does not have blood pressure cuff, we will  send her 1 and she is advised to send [place] blood pressure diary. 3. Frequent PVCs -she is asymptomatic.  Resolved with metoprolol.   4. Morbid obesity -she is encouraged by her weight loss group, she is strongly advised to start daily walking starting with 15 minutes and working it up by 5-minute increments up to 30 minutes daily 5 times a week. 5. Hyperlipidemia -on Crestor is well-tolerated, her LDL is okay her triglycerides are elevated she is explained necessity of low-carb diet as well as addition of fish oil 1000 units daily.  COVID-19 Education: The signs and symptoms of COVID-19 were discussed with the patient and how to seek care for testing (follow up with PCP or arrange E-visit).  The importance of social distancing was discussed today.  Time:   Today, I have spent 15 minutes with the patient with telehealth technology discussing the above problems.     Medication Adjustments/Labs and Tests Ordered: Current medicines are reviewed at length with the patient today.  Concerns regarding medicines are outlined above.   Tests Ordered: No orders of the defined types were placed in this encounter.   Medication Changes: Meds ordered this encounter  Medications  . Omega-3 Fatty Acids (FISH OIL) 1000 MG CAPS    Sig: Take 1 capsule (1,000 mg total) by mouth daily.    Dispense:  30 capsule    Refill:  6    Follow Up:  In Person in 6 month(s)  Signed, Ena Dawley, MD  12/25/2019 5:30 PM    [place]

## 2019-12-25 NOTE — Telephone Encounter (Signed)

## 2019-12-25 NOTE — Patient Instructions (Signed)
Medication Instructions:   START TAKING FISH OIL 1,000 MG BY MOUTH DAILY--YOU CAN GET THIS OVER-THE-COUNTER  *If you need a refill on your cardiac medications before your next appointment, please call your pharmacy*    Follow-Up: At [facility] you and your health needs are our priority.  As part of our continuing mission to provide you with exceptional heart care, we have created designated Provider Care Teams.  These Care Teams include your primary Cardiologist (physician) and Advanced Practice Providers (APPs -  Physician Assistants and Nurse Practitioners) who all work together to provide you with the care you need, when you need it.  Your next appointment:   6 month(s)  The format for your next appointment:   In Person  Provider:   Ena Dawley, MD  Other Instructions  Alexandra Beck, TO SEND YOU A FREE BP CUFF TO YOUR HOME ADDRESS.

## 2019-12-26 ENCOUNTER — Telehealth: Payer: Self-pay | Admitting: Licensed Clinical Social Worker

## 2019-12-26 ENCOUNTER — Encounter: Payer: Self-pay | Admitting: Family

## 2019-12-26 ENCOUNTER — Telehealth (INDEPENDENT_AMBULATORY_CARE_PROVIDER_SITE_OTHER): Payer: BC Managed Care – PPO | Admitting: Family

## 2019-12-26 DIAGNOSIS — L409 Psoriasis, unspecified: Secondary | ICD-10-CM | POA: Diagnosis not present

## 2019-12-26 DIAGNOSIS — L03116 Cellulitis of left lower limb: Secondary | ICD-10-CM | POA: Diagnosis not present

## 2019-12-26 NOTE — Progress Notes (Signed)
Subjective:    Patient ID: Alexandra Beck, female    DOB: [birth year], 39 y.o.   MRN: [id number]  No chief complaint on file.    HPI:  Alexandra Beck is a 39 y.o. female with cellulitis was previously seen in the office on 01/29/2019 on prophylactic amoxicillin and concern for psoriasis and referred to dermatology. I met with Alexandra Beck through [place] video visit today where she was located at home and I was present in the office.   Alexandra Beck continues to take amoxicillin as prescribed with no adverse side effects or missed doses.  She has had looser stools with the medication.  No recent exacerbations of cellulitis and has remained out of the hospital for the last year and a half.  She continues to work with the [facility] wellness center and has lost over 45 pounds since initial visit with them.  She is wearing compression stockings while at home.  She remains working from home due to the coronavirus pandemic.  She does continue to have patches of dry and scaly skin in several areas including her wrists and hands that have been refractory to previously prescribed creams.  Has not attended dermatology due to pandemic and is interested in pursuing that referral in the near future.   Allergies  Allergen Reactions  . Aspirin Hives and Swelling    REACTION: throat swelling, hives  . Doxycycline Other (See Comments)    Abdominal pain  . Lisinopril Cough  . Niaspan [Niacin Er]     Caused flushing      Outpatient Medications Prior to Visit  Medication Sig Dispense Refill  . amoxicillin (AMOXIL) 500 MG capsule TAKE 1 CAPSULE(500 MG) BY MOUTH TWICE DAILY 60 capsule 0  . furosemide (LASIX) 80 MG tablet TAKE 1 TABLET BY MOUTH DAILY 30 tablet 0  . levothyroxine (SYNTHROID) 150 MCG tablet Take 1 tablet (150 mcg total) by mouth daily before breakfast. 90 tablet 3  . losartan (COZAAR) 50 MG tablet Take 1 tablet (50 mg total) by mouth daily. 30 tablet 0  . metFORMIN (GLUCOPHAGE) 500 MG tablet  Take 1 tablet (500 mg total) by mouth daily. 90 tablet 1  . metoprolol succinate (TOPROL-XL) 50 MG 24 hr tablet Take 1 tablet (50 mg total) by mouth daily. Take with or immediately following a meal. 90 tablet 3  . modafinil (PROVIGIL) 100 MG tablet Take 300 mg by mouth every morning.    . Omega-3 Fatty Acids (FISH OIL) 1000 MG CAPS Take 1 capsule (1,000 mg total) by mouth daily. 30 capsule 6  . potassium chloride SA (KLOR-CON) 20 MEQ tablet Take 2 tablets (40 mEq total) by mouth daily. Please keep upcoming appt with Dr. Meda Coffee in January before anymore refills. Thank you 60 tablet 0  . rosuvastatin (CRESTOR) 10 MG tablet Take 1 tablet (10 mg total) by mouth daily. 90 tablet 1  . traMADol (ULTRAM) 50 MG tablet Take 1-2 tablets (50-100 mg total) by mouth every 8 (eight) hours as needed. 20 tablet 0  . TRINTELLIX 10 MG TABS tablet TAKE 1 TABLET(10 MG) BY MOUTH DAILY 30 tablet 0  . Vitamin D, Ergocalciferol, (DRISDOL) 1.25 MG (50000 UNIT) CAPS capsule TAKE 1 CAPSULE BY MOUTH EVERY 7 DAYS 4 capsule 0   No facility-administered medications prior to visit.     Past Medical History:  Diagnosis Date  . Acute renal failure (ARF) ([place]) 11/2012    multifactorial-likely secondary to ATN in the setting of sepsis and  hypotension, and also from rhabdomyolysis  . Anemia   . Cellulitis   . Chronic diastolic CHF (congestive heart failure) ([place])   . Depression   . Diabetes mellitus ([place])   . GERD (gastroesophageal reflux disease)   . Hyperlipidemia   . Hypertension   . Hypothyroidism   . Morbid obesity with BMI of 70 and over, adult ([place])   . Obesity hypoventilation syndrome ([place])   . OSA (obstructive sleep apnea)   . PVC's (premature ventricular contractions)   . Recurrent cellulitis of lower leg    LLE, venous insuff  . Sepsis ([place]) 11/2012   Secondary to cellulitis  . Sleep apnea   . Venous insufficiency of leg      Past Surgical History:  Procedure Laterality Date  . IR FLUORO GUIDE CV  MIDLINE PICC RIGHT  03/28/2017  . IR US GUIDE VASC ACCESS RIGHT  03/28/2017  . WISDOM TOOTH EXTRACTION         Review of Systems  Constitutional: Negative for chills, diaphoresis, fatigue and fever.  Respiratory: Negative for cough, chest tightness, shortness of breath and wheezing.   Cardiovascular: Negative for chest pain.  Gastrointestinal: Negative for abdominal pain, diarrhea, nausea and vomiting.      Objective:    There were no vitals taken for this visit. Nursing note and vital signs reviewed.  Physical Exam Constitutional:      General: She is not in acute distress.    Appearance: She is obese. She is not ill-appearing or diaphoretic.  Neurological:     Mental Status: She is alert and oriented to person, place, and time.  Psychiatric:        Mood and Affect: Mood normal.      Depression screen [facility] 2/9 04/17/2018 02/27/2018 10/11/2017  Decreased Interest 3 0 0  Down, Depressed, Hopeless 3 0 0  PHQ - 2 Score 6 0 0  Altered sleeping 3 - -  Tired, decreased energy 3 - -  Change in appetite 2 - -  Feeling bad or failure about yourself  3 - -  Trouble concentrating 1 - -  Moving slowly or fidgety/restless 3 - -  Suicidal thoughts 2 - -  PHQ-9 Score 23 - -  Difficult doing work/chores Very difficult - -  Some recent data might be hidden       Assessment & Plan:    Patient Active Problem List   Diagnosis Date Noted  . 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 Hypersomnolence 05/11/2018  . Vitamin D deficiency 05/02/2018  . Cellulitis of left leg 04/17/2017  . Cellulitis of right leg 01/11/2017  . PVC's (premature ventricular contractions) 12/02/2016  . Chronic diastolic CHF (congestive heart failure) ([place])   . Acute kidney injury ([place])   . Hyperlipidemia   . Diabetes mellitus type 2 in obese ([place])   . Hypothyroidism   . GERD (gastroesophageal reflux disease)   . Depression   . Venous insufficiency of leg   .  Recurrent cellulitis of lower leg 12/25/2012  . OSA (obstructive sleep apnea) 12/25/2012  . Obesity hypoventilation syndrome ([place]) 12/25/2012  . Morbid obesity with BMI of 70 and over, adult ([place]) 12/24/2012  . Essential hypertension 06/26/2007     Problem List Items Addressed This Visit    None       I am having Duwayne Heck maintain her rosuvastatin, levothyroxine, metFORMIN, traMADol, metoprolol succinate, losartan, furosemide, Trintellix, potassium chloride SA, amoxicillin, Vitamin D (Ergocalciferol),  modafinil, and Fish Oil.   No orders of the defined types were placed in this encounter.    Follow-up: No follow-ups on file.   Terri Piedra, MSN, FNP-C Nurse Practitioner [facility] [place] number: [phone]

## 2019-12-26 NOTE — Telephone Encounter (Signed)
CSW referred to assist patient with obtaining a BP cuff. CSW contacted patient to inform cuff will be delivered to home. Patient grateful for support and assistance. CSW available as needed. Jackie Jazira Maloney, LCSW, CCSW-MCS [phone]  

## 2019-12-26 NOTE — Assessment & Plan Note (Signed)
Alexandra Beck has well-controlled cellulitis with her prophylactic dose of amoxicillin twice daily.  No significant adverse side effects with the medication and has had no exacerbations recently.  Discussed possibility of antibiotic holiday given recent weight loss and compression stockings.  We will set goal of 6 months for possible drug holiday.  Continue current dose of amoxicillin for prophylaxis.

## 2019-12-26 NOTE — Assessment & Plan Note (Signed)
Alexandra Beck has not seen any significant improvements in her psoriasis/rash previously described by Dr. Baxter Flattery and is not interested in pursuing referral to dermatology in the near future.  She will let us know when she is ready for further evaluation.

## 2019-12-26 NOTE — Patient Instructions (Signed)
Nice to see you.  Keep up the great work!  We will continue your amoxicillin for the next 6 months and consider a medication holiday.  Continue to work with the wellness center and weight loss.  Let me know if you have any questions or concerns.  Have a great day and stay safe!

## 2019-12-31 ENCOUNTER — Other Ambulatory Visit: Payer: Self-pay

## 2019-12-31 ENCOUNTER — Ambulatory Visit (INDEPENDENT_AMBULATORY_CARE_PROVIDER_SITE_OTHER): Payer: BC Managed Care – PPO | Admitting: Physician Assistant

## 2019-12-31 ENCOUNTER — Encounter (INDEPENDENT_AMBULATORY_CARE_PROVIDER_SITE_OTHER): Payer: Self-pay | Admitting: Physician Assistant

## 2019-12-31 VITALS — BP 157/75 | HR 72 | Temp 98.0°F | Ht 62.0 in | Wt >= 6400 oz

## 2019-12-31 DIAGNOSIS — Z9189 Other specified personal risk factors, not elsewhere classified: Secondary | ICD-10-CM

## 2019-12-31 DIAGNOSIS — F3289 Other specified depressive episodes: Secondary | ICD-10-CM | POA: Diagnosis not present

## 2019-12-31 DIAGNOSIS — Z6841 Body Mass Index (BMI) 40.0 and over, adult: Secondary | ICD-10-CM

## 2019-12-31 DIAGNOSIS — E559 Vitamin D deficiency, unspecified: Secondary | ICD-10-CM | POA: Diagnosis not present

## 2020-01-01 MED ORDER — VORTIOXETINE HBR 10 MG PO TABS: 10.0000 mg | ORAL_TABLET | ORAL | 0 refills | Status: DC

## 2020-01-01 MED ORDER — VITAMIN D (ERGOCALCIFEROL) 1.25 MG (50000 UNIT) PO CAPS: 50000.0000 [IU] | ORAL_CAPSULE | ORAL | 0 refills | Status: DC

## 2020-01-01 NOTE — Progress Notes (Signed)
Chief Complaint:   OBESITY Alexandra Beck is here to discuss her progress with her obesity treatment plan along with follow-up of her obesity related diagnoses. [name] is on the Category 4 Plan and keeping a food journal and adhering to recommended goals of 550-700 calories and 45 grams of protein at supper daily and states she is following her eating plan approximately 70% of the time. [name] states she is doing 0 minutes 0 times per week.  Today's visit was #: 72 Starting weight: 468 lbs Starting date: 04/17/18 Today's weight: 423 lbs Today's date: 12/31/2019 Total lbs lost to date: 45 Total lbs lost since last in-office visit: 3  Interim History: Alexandra Beck is tearful today. She is attempting to get in all of her food throughout the day, and she would like to discuss strategies on how to do so.  Subjective:   1. Vitamin D deficiency Alexandra Beck is on Vit D, and she denies nausea, vomiting, or muscle weakness. Last Vit D level was not at goal. I discussed labs with the patient today.  2. Other depression,with emotional eating  Alexandra Beck denies suicidal ideas or homicidal ideas on Trintellix. Her mood has decreased the last few weeks.  3. At risk for osteoporosis Alexandra Beck is at higher risk of osteopenia and osteoporosis due to Vitamin D deficiency.   Assessment/Plan:   1. Vitamin D deficiency Low Vitamin D level contributes to fatigue and are associated with obesity, breast, and colon cancer. Angeligue agreed to increase prescription Vitamin D to 50,000 IU twice weekly with no refill. She will follow-up for routine testing of Vitamin D, at least 2-3 times per year to avoid over-replacement.  - Vitamin D, Ergocalciferol, (DRISDOL) 1.25 MG (50000 UNIT) CAPS capsule; Take 1 capsule (50,000 Units total) by mouth every 3 (three) days. TAKE 1 CAPSULE BY MOUTH EVERY 7 DAYS  Dispense: 10 capsule; Refill: 0  2. Other depression,with emotional eating  Behavior modification techniques were discussed today to help [name]  deal with her emotional/non-hunger eating behaviors. Lavera agreed to increase Trintellix to 15 mg daily with no refill. Orders and follow up as documented in patient record.   - vortioxetine HBr (TRINTELLIX) 10 MG TABS tablet; Take 1 tablet (10 mg total) by mouth as directed. Take 1 1/2 daily  Dispense: 45 tablet; Refill: 0  3. At risk for osteoporosis Alexandra Beck was given approximately 15 minutes of osteoporosis prevention counseling today. Alexandra Beck is at risk for osteopenia and osteoporosis due to her Vitamin D deficiency. She was encouraged to take her Vitamin D and follow her higher calcium diet and increase strengthening exercise to help strengthen her bones and decrease her risk of osteopenia and osteoporosis.  Repetitive spaced learning was employed today to elicit superior memory formation and behavioral change.  4. Class 3 severe obesity with serious comorbidity and body mass index (BMI) greater than or equal to 70 in adult, unspecified obesity type ([place]) Alexandra Beck is currently in the action stage of change. As such, her goal is to continue with weight loss efforts. She has agreed to the Category 4 Plan and keeping a food journal and adhering to recommended goals of 550-700 calories and 45 grams of protein at supper daily.   Exercise goals: No exercise has been prescribed at this time.  Behavioral modification strategies: no skipping meals and meal planning and cooking strategies.  [name] has agreed to follow-up with our clinic in 2 weeks. She was informed of the importance of frequent follow-up visits to maximize her success with  intensive lifestyle modifications for her multiple health conditions.   Objective:   Blood pressure (!) 157/75, pulse 72, temperature 98 F (36.7 C), temperature source Oral, height 5\' 2"  (1.575 m), weight (!) 423 lb (191.9 kg), SpO2 95 %. Body mass index is 77.37 kg/m.  General: Cooperative, alert, well developed, in no acute distress. HEENT: Conjunctivae and lids  unremarkable. Cardiovascular: Regular rhythm.  Lungs: Normal work of breathing. Neurologic: No focal deficits.   Lab Results  Component Value Date   CREATININE 0.75 12/17/2019   BUN 20 12/17/2019   NA 142 12/17/2019   K 4.3 12/17/2019   CL 103 12/17/2019   CO2 26 12/17/2019   Lab Results  Component Value Date   ALT 18 12/17/2019   AST 16 12/17/2019   ALKPHOS 82 12/17/2019   BILITOT 0.3 12/17/2019   Lab Results  Component Value Date   HGBA1C 5.6 12/17/2019   HGBA1C 5.6 07/04/2019   HGBA1C 5.4 12/04/2018   HGBA1C 5.8 (H) 07/25/2018   HGBA1C 6.5 03/23/2018   Lab Results  Component Value Date   INSULIN 17.1 12/17/2019   INSULIN 23.5 07/04/2019   INSULIN 25.8 (H) 12/04/2018   INSULIN 24.7 07/25/2018   INSULIN 25.0 (H) 04/17/2018   Lab Results  Component Value Date   TSH 1.980 07/04/2019   Lab Results  Component Value Date   CHOL 194 12/17/2019   HDL 47 12/17/2019   LDLCALC 106 (H) 12/17/2019   LDLDIRECT 141.0 03/02/2016   TRIG 240 (H) 12/17/2019   CHOLHDL 4 03/23/2018   Lab Results  Component Value Date   WBC 5.9 07/25/2018   HGB 14.8 07/25/2018   HCT 44.8 07/25/2018   MCV 88 07/25/2018   PLT 230.0 03/23/2018   No results found for: IRON, TIBC, FERRITIN  Attestation Statements:   Reviewed by clinician on day of visit: allergies, medications, problem list, medical history, surgical history, family history, social history, and previous encounter notes.   Wilhemena Durie, am acting as transcriptionist for [employer], PA-C.  I have reviewed the above documentation for accuracy and completeness, and I agree with the above. Abby Potash, PA-C

## 2020-01-15 ENCOUNTER — Other Ambulatory Visit (INDEPENDENT_AMBULATORY_CARE_PROVIDER_SITE_OTHER): Payer: Self-pay | Admitting: Physician Assistant

## 2020-01-15 ENCOUNTER — Ambulatory Visit (INDEPENDENT_AMBULATORY_CARE_PROVIDER_SITE_OTHER): Payer: BC Managed Care – PPO | Admitting: Physician Assistant

## 2020-01-15 ENCOUNTER — Other Ambulatory Visit: Payer: Self-pay

## 2020-01-15 ENCOUNTER — Encounter (INDEPENDENT_AMBULATORY_CARE_PROVIDER_SITE_OTHER): Payer: Self-pay | Admitting: Physician Assistant

## 2020-01-15 ENCOUNTER — Other Ambulatory Visit: Payer: Self-pay | Admitting: Cardiology

## 2020-01-15 VITALS — BP 141/61 | HR 72 | Temp 99.0°F | Ht 62.0 in | Wt >= 6400 oz

## 2020-01-15 DIAGNOSIS — E7849 Other hyperlipidemia: Secondary | ICD-10-CM | POA: Diagnosis not present

## 2020-01-15 DIAGNOSIS — E559 Vitamin D deficiency, unspecified: Secondary | ICD-10-CM

## 2020-01-15 DIAGNOSIS — Z9189 Other specified personal risk factors, not elsewhere classified: Secondary | ICD-10-CM

## 2020-01-15 DIAGNOSIS — L03119 Cellulitis of unspecified part of limb: Secondary | ICD-10-CM

## 2020-01-15 DIAGNOSIS — I10 Essential (primary) hypertension: Secondary | ICD-10-CM

## 2020-01-15 DIAGNOSIS — I509 Heart failure, unspecified: Secondary | ICD-10-CM

## 2020-01-15 DIAGNOSIS — Z6841 Body Mass Index (BMI) 40.0 and over, adult: Secondary | ICD-10-CM

## 2020-01-15 MED ORDER — AMOXICILLIN 500 MG PO CAPS: ORAL_CAPSULE | ORAL | 3 refills | Status: DC

## 2020-01-15 MED ORDER — VITAMIN D (ERGOCALCIFEROL) 1.25 MG (50000 UNIT) PO CAPS: 50000.0000 [IU] | ORAL_CAPSULE | ORAL | 0 refills | Status: DC

## 2020-01-15 NOTE — Progress Notes (Signed)
Chief Complaint:   OBESITY Alexandra Beck is here to discuss her progress with her obesity treatment plan along with follow-up of her obesity related diagnoses. Alexandra Beck is on the Category 4 Plan and keeping a food journal and adhering to recommended goals of 550-700 calories and 45 grams of  protein at supper and states she is following her eating plan approximately 70% of the time. Alexandra Beck states she is exercising for 0 minutes 0 times per week.  Today's visit was #: 63 Starting weight: 468 lbs Starting date: 04/17/2018 Today's weight: 426 lbs Today's date: 01/15/2020 Total lbs lost to date: 42 lbs Total lbs lost since last in-office visit: 0  Interim History: Alexandra Beck reports that she has done a better job with not skipping meals. She is trying to eat at the same time as her wife, which is sometimes a challenge.  Subjective:   1. Vitamin D deficiency Alexandra Beck's Vitamin D level was 38.2 on 12/17/2019. She is currently taking vit D twice weekly. She denies nausea, vomiting or muscle weakness.  2. Other hyperlipidemia Alexandra Beck has hyperlipidemia and has been trying to improve her cholesterol levels with intensive lifestyle modification including a low saturated fat diet, exercise and weight loss. She denies any chest pain, claudication or myalgias.  She is taking Crestor.  Last level at goal.  Lab Results  Component Value Date   ALT 18 12/17/2019   AST 16 12/17/2019   ALKPHOS 82 12/17/2019   BILITOT 0.3 12/17/2019   Lab Results  Component Value Date   CHOL 194 12/17/2019   HDL 47 12/17/2019   LDLCALC 106 (H) 12/17/2019   LDLDIRECT 141.0 03/02/2016   TRIG 240 (H) 12/17/2019   CHOLHDL 4 03/23/2018   3. At risk for heart disease Alexandra Beck is at a higher than average risk for cardiovascular disease due to obesity. Reviewed: no chest pain on exertion, no dyspnea on exertion, and no swelling of ankles.  Assessment/Plan:   1. Vitamin D deficiency Low Vitamin D level contributes to fatigue and are  associated with obesity, breast, and colon cancer. She agrees to continue to take prescription Vitamin D @50 ,000 IU every week and will follow-up for routine testing of Vitamin D, at least 2-3 times per year to avoid over-replacement. - Vitamin D, Ergocalciferol, (DRISDOL) 1.25 MG (50000 UNIT) CAPS capsule; Take 1 capsule (50,000 Units total) by mouth every 3 (three) days. TAKE 1 CAPSULE BY MOUTH EVERY 7 DAYS  Dispense: 10 capsule; Refill: 0  2. Other hyperlipidemia Cardiovascular risk and specific lipid/LDL goals reviewed.  We discussed several lifestyle modifications today and Alexandra Beck will continue to work on diet, exercise and weight loss efforts. Orders and follow up as documented in patient record.   Counseling Intensive lifestyle modifications are the first line treatment for this issue. . Dietary changes: Increase soluble fiber. Decrease simple carbohydrates. . Exercise changes: Moderate to vigorous-intensity aerobic activity 150 minutes per week if tolerated. . Lipid-lowering medications: see documented in medical record.  3. At risk for heart disease Alexandra Beck was given approximately 15 minutes of coronary artery disease prevention counseling today. She is 39 y.o. female and has risk factors for heart disease including obesity. We discussed intensive lifestyle modifications today with an emphasis on specific weight loss instructions and strategies.   Repetitive spaced learning was employed today to elicit superior memory formation and behavioral change.  4. Class 3 severe obesity with serious comorbidity and body mass index (BMI) greater than or equal to 70 in adult, unspecified obesity  type Alexandra Beck) Alexandra Beck is currently in the action stage of change. As such, her goal is to continue with weight loss efforts. She has agreed to the Category 4 Plan.   Exercise goals: For substantial health benefits, adults should do at least 150 minutes (2 hours and 30 minutes) a week of moderate-intensity, or 75  minutes (1 hour and 15 minutes) a week of vigorous-intensity aerobic physical activity, or an equivalent combination of moderate- and vigorous-intensity aerobic activity. Aerobic activity should be performed in episodes of at least 10 minutes, and preferably, it should be spread throughout the week.  Behavioral modification strategies: meal planning and cooking strategies and keeping healthy foods in the home.  Alexandra Beck has agreed to follow-up with our clinic in 2 weeks. She was informed of the importance of frequent follow-up visits to maximize her success with intensive lifestyle modifications for her multiple health conditions.   Objective:   Blood pressure (!) 141/61, pulse 72, temperature 99 F (37.2 C), temperature source Oral, height 5\' 2"  (1.575 m), weight (!) 426 lb (193.2 kg), SpO2 92 %. Body mass index is 77.92 kg/m.  General: Cooperative, alert, well developed, in no acute distress. HEENT: Conjunctivae and lids unremarkable. Cardiovascular: Regular rhythm.  Lungs: Normal work of breathing. Neurologic: No focal deficits.   Lab Results  Component Value Date   CREATININE 0.75 12/17/2019   BUN 20 12/17/2019   NA 142 12/17/2019   K 4.3 12/17/2019   CL 103 12/17/2019   CO2 26 12/17/2019   Lab Results  Component Value Date   ALT 18 12/17/2019   AST 16 12/17/2019   ALKPHOS 82 12/17/2019   BILITOT 0.3 12/17/2019   Lab Results  Component Value Date   HGBA1C 5.6 12/17/2019   HGBA1C 5.6 07/04/2019   HGBA1C 5.4 12/04/2018   HGBA1C 5.8 (H) 07/25/2018   HGBA1C 6.5 03/23/2018   Lab Results  Component Value Date   INSULIN 17.1 12/17/2019   INSULIN 23.5 07/04/2019   INSULIN 25.8 (H) 12/04/2018   INSULIN 24.7 07/25/2018   INSULIN 25.0 (H) 04/17/2018   Lab Results  Component Value Date   TSH 1.980 07/04/2019   Lab Results  Component Value Date   CHOL 194 12/17/2019   HDL 47 12/17/2019   LDLCALC 106 (H) 12/17/2019   LDLDIRECT 141.0 03/02/2016   TRIG 240 (H) 12/17/2019    CHOLHDL 4 03/23/2018   Lab Results  Component Value Date   WBC 5.9 07/25/2018   HGB 14.8 07/25/2018   HCT 44.8 07/25/2018   MCV 88 07/25/2018   PLT 230.0 03/23/2018   Attestation Statements:   Reviewed by clinician on day of visit: allergies, medications, problem list, medical history, surgical history, family history, social history, and previous encounter notes.  I, Water quality scientist, CMA, am acting as Location manager for [employer], PA-C.  I have reviewed the above documentation for accuracy and completeness, and I agree with the above. Abby Potash, PA-C

## 2020-01-16 ENCOUNTER — Other Ambulatory Visit: Payer: Self-pay

## 2020-01-16 DIAGNOSIS — I10 Essential (primary) hypertension: Secondary | ICD-10-CM

## 2020-01-16 MED ORDER — FUROSEMIDE 80 MG PO TABS: ORAL_TABLET | ORAL | 3 refills | Status: DC

## 2020-02-04 ENCOUNTER — Ambulatory Visit (INDEPENDENT_AMBULATORY_CARE_PROVIDER_SITE_OTHER): Payer: BC Managed Care – PPO | Admitting: Family Medicine

## 2020-02-04 ENCOUNTER — Encounter (INDEPENDENT_AMBULATORY_CARE_PROVIDER_SITE_OTHER): Payer: Self-pay | Admitting: Family Medicine

## 2020-02-04 VITALS — BP 152/73 | HR 67 | Temp 98.2°F | Ht 62.0 in | Wt >= 6400 oz

## 2020-02-04 DIAGNOSIS — Z9189 Other specified personal risk factors, not elsewhere classified: Secondary | ICD-10-CM | POA: Diagnosis not present

## 2020-02-04 DIAGNOSIS — E559 Vitamin D deficiency, unspecified: Secondary | ICD-10-CM

## 2020-02-04 DIAGNOSIS — Z6841 Body Mass Index (BMI) 40.0 and over, adult: Secondary | ICD-10-CM

## 2020-02-04 DIAGNOSIS — F3289 Other specified depressive episodes: Secondary | ICD-10-CM | POA: Diagnosis not present

## 2020-02-04 MED ORDER — VORTIOXETINE HBR 20 MG PO TABS: 20.0000 mg | ORAL_TABLET | Freq: Every day | ORAL | 0 refills | Status: DC

## 2020-02-04 MED ORDER — VITAMIN D (ERGOCALCIFEROL) 1.25 MG (50000 UNIT) PO CAPS: 50000.0000 [IU] | ORAL_CAPSULE | ORAL | 0 refills | Status: DC

## 2020-02-04 NOTE — Progress Notes (Signed)
Chief Complaint:   OBESITY Alexandra Beck is here to discuss her progress with her obesity treatment plan along with follow-up of her obesity related diagnoses. Alexandra Beck is on the Category 4 Plan and states she is following her eating plan approximately 80% of the time. Alexandra Beck states she is exercising 0 minutes 0 times per week.  Today's visit was #: 35 Starting weight: 468 lbs Starting date: 04/17/2018 Today's weight: 429 lbs Today's date: 02/04/2020 Total lbs lost to date: 39 Total lbs lost since last in-office visit: 0  Interim History: Alexandra Beck is having a hard time emotionally, and in terms of following the plan. Her significant other has been having a hard time at work. She had a few episodes of diarrhea, which patient attributed to fish oil she was taking. Patient reports that she normally tries to only eat out on Friday, but she has increased take out, secondary to wife's stress.  Subjective:   Vitamin D deficiency  Alexandra Beck's Vitamin D level was 38.2 on 12/17/2019. She was on increased dose of vitamin D previously. She admits fatigue and  denies nausea, vomiting or muscle weakness.  Other depression,with emotional eating  Alexandra Beck 's symptoms are still uncontrolled. Patient is crying throughout the appointment. She shows no sign of suicidal or homicidal ideations. Alexandra Beck is on Trintellix 15 mg daily.  At risk for osteoporosis Alexandra Beck is at higher risk of osteopenia and osteoporosis due to Vitamin D deficiency.   Assessment/Plan:   Vitamin D deficiency  Low Vitamin D level contributes to fatigue and are associated with obesity, breast, and colon cancer. Ahava agrees to continue to take prescription Vitamin D @50 ,000 IU every week #4 with no refills and she will follow-up for routine testing of Vitamin D, at least 2-3 times per year to avoid over-replacement.  Other depression,with emotional eating  Behavior modification techniques were discussed today to help Lenny deal with her  emotional/non-hunger eating behaviors. We will refer to [name] for therapy services; and increase vortioxetine HBr (TRINTELLIX) to 20 MG daily  Orders and follow up as documented in patient record.   At risk for osteoporosis Alexandra Beck was given approximately 15 minutes of osteoporosis prevention counseling today. Alexandra Beck is at risk for osteopenia and osteoporosis due to her Vitamin D deficiency. She was encouraged to take her Vitamin D and follow her higher calcium diet and increase strengthening exercise to help strengthen her bones and decrease her risk of osteopenia and osteoporosis.  Repetitive spaced learning was employed today to elicit superior memory formation and behavioral change.  Class 3 severe obesity with serious comorbidity and body mass index (BMI) greater than or equal to 70 in adult, unspecified obesity type ([place]) Alexandra Beck is currently in the action stage of change. As such, her goal is to continue with weight loss efforts. She has agreed to the Category 4 Plan.   Exercise goals: No exercise has been prescribed at this time.  Behavioral modification strategies: increasing lean protein intake, increasing vegetables, meal planning and cooking strategies, keeping healthy foods in the home and planning for success.  Alexandra Beck has agreed to follow-up with our clinic in 2 weeks. She was informed of the importance of frequent follow-up visits to maximize her success with intensive lifestyle modifications for her multiple health conditions.   Objective:   Blood pressure (!) 152/73, pulse 67, temperature 98.2 F (36.8 C), temperature source Oral, height 5\' 2"  (1.575 m), weight (!) 429 lb (194.6 kg), last menstrual period 01/12/2020, SpO2 94 %.  Body mass index is 78.47 kg/m.  General: Cooperative, alert, well developed, in no acute distress. HEENT: Conjunctivae and lids unremarkable. Cardiovascular: Regular rhythm.  Lungs: Normal work of breathing. Neurologic: No focal deficits.   Lab  Results  Component Value Date   CREATININE 0.75 12/17/2019   BUN 20 12/17/2019   NA 142 12/17/2019   K 4.3 12/17/2019   CL 103 12/17/2019   CO2 26 12/17/2019   Lab Results  Component Value Date   ALT 18 12/17/2019   AST 16 12/17/2019   ALKPHOS 82 12/17/2019   BILITOT 0.3 12/17/2019   Lab Results  Component Value Date   HGBA1C 5.6 12/17/2019   HGBA1C 5.6 07/04/2019   HGBA1C 5.4 12/04/2018   HGBA1C 5.8 (H) 07/25/2018   HGBA1C 6.5 03/23/2018   Lab Results  Component Value Date   INSULIN 17.1 12/17/2019   INSULIN 23.5 07/04/2019   INSULIN 25.8 (H) 12/04/2018   INSULIN 24.7 07/25/2018   INSULIN 25.0 (H) 04/17/2018   Lab Results  Component Value Date   TSH 1.980 07/04/2019   Lab Results  Component Value Date   CHOL 194 12/17/2019   HDL 47 12/17/2019   LDLCALC 106 (H) 12/17/2019   LDLDIRECT 141.0 03/02/2016   TRIG 240 (H) 12/17/2019   CHOLHDL 4 03/23/2018   Lab Results  Component Value Date   WBC 5.9 07/25/2018   HGB 14.8 07/25/2018   HCT 44.8 07/25/2018   MCV 88 07/25/2018   PLT 230.0 03/23/2018   No results found for: IRON, TIBC, FERRITIN   Ref. Range 12/17/2019 14:39  Vitamin D, 25-Hydroxy Latest Ref Range: 30.0 - 100.0 ng/mL 38.2    Attestation Statements:   Reviewed by clinician on day of visit: allergies, medications, problem list, medical history, surgical history, family history, social history, and previous encounter notes.  I, Doreene Nest, am acting as transcriptionist for Coralie Common, MD.  I have reviewed the above documentation for accuracy and completeness, and I agree with the above. - [name] Qua, MD

## 2020-02-17 ENCOUNTER — Other Ambulatory Visit: Payer: Self-pay | Admitting: Family

## 2020-02-17 ENCOUNTER — Other Ambulatory Visit: Payer: Self-pay | Admitting: Cardiology

## 2020-02-17 DIAGNOSIS — I10 Essential (primary) hypertension: Secondary | ICD-10-CM

## 2020-02-19 ENCOUNTER — Other Ambulatory Visit: Payer: Self-pay

## 2020-02-19 ENCOUNTER — Ambulatory Visit (INDEPENDENT_AMBULATORY_CARE_PROVIDER_SITE_OTHER): Payer: BC Managed Care – PPO | Admitting: Family Medicine

## 2020-02-19 ENCOUNTER — Encounter (INDEPENDENT_AMBULATORY_CARE_PROVIDER_SITE_OTHER): Payer: Self-pay | Admitting: Family Medicine

## 2020-02-19 VITALS — BP 134/73 | HR 64 | Temp 98.7°F | Ht 62.0 in | Wt >= 6400 oz

## 2020-02-19 DIAGNOSIS — Z6841 Body Mass Index (BMI) 40.0 and over, adult: Secondary | ICD-10-CM

## 2020-02-19 DIAGNOSIS — F329 Major depressive disorder, single episode, unspecified: Secondary | ICD-10-CM

## 2020-02-19 DIAGNOSIS — F419 Anxiety disorder, unspecified: Secondary | ICD-10-CM | POA: Diagnosis not present

## 2020-02-19 DIAGNOSIS — E1165 Type 2 diabetes mellitus with hyperglycemia: Secondary | ICD-10-CM | POA: Diagnosis not present

## 2020-02-19 NOTE — Progress Notes (Signed)
Chief Complaint:   OBESITY Alexandra Beck is here to discuss her progress with her obesity treatment plan along with follow-up of her obesity related diagnoses. Alexandra Beck is on the Category 4 Plan and states she is following her eating plan approximately 92% of the time. Alexandra Beck states she is exercising for 0 minutes 0 times per week.  Today's visit was #: 95 Starting weight: 468 lbs Starting date: 04/17/2018 Today's weight: 422 lbs Today's date: 02/19/2020 Total lbs lost to date: 46 lbs Total lbs lost since last in-office visit: 7 lbs  Interim History: Alexandra Beck voices she had significantly fewer indulgences and has stuck more to plan that previously.  She had one indulgence of a milkshake and jelly beans.  She is doing a wrap instead of a sandwich.  Her wife's birthday is coming up and there has been some talk of an ice cream cake.  They are planning to get together with family for Easter.  Her goal is to be under 400 pounds by her birthday.  Subjective:   1. Type 2 diabetes mellitus with hyperglycemia, without long-term current use of insulin (HCC) Medications reviewed.  She is taking metformin 500 mg daily.  No GI side effects.  Occasional cravings but well-controlled over the last 2 weeks.  Lab Results  Component Value Date   HGBA1C 5.6 12/17/2019   HGBA1C 5.6 07/04/2019   HGBA1C 5.4 12/04/2018   Lab Results  Component Value Date   MICROALBUR 8.05 (H) 06/26/2007   LDLCALC 106 (H) 12/17/2019   CREATININE 0.75 12/17/2019   Lab Results  Component Value Date   INSULIN 17.1 12/17/2019   INSULIN 23.5 07/04/2019   INSULIN 25.8 (H) 12/04/2018   INSULIN 24.7 07/25/2018   INSULIN 25.0 (H) 04/17/2018   2. Anxiety and depression Alexandra Beck has received 2 calls from the counseling clinic to set up with a counselor.  She is on Trintellix 20 mg daily.  Assessment/Plan:   1. Type 2 diabetes mellitus with hyperglycemia, without long-term current use of insulin (HCC) Good blood sugar control is  important to decrease the likelihood of diabetic complications such as nephropathy, neuropathy, limb loss, blindness, coronary artery disease, and death. Intensive lifestyle modification including diet, exercise and weight loss are the first line of treatment for diabetes.   2. Anxiety and depression Behavior modification techniques were discussed today to help Alexandra Beck deal with her emotional/non-hunger eating behaviors.  Orders and follow up as documented in patient record.  She is to call the counseling center back to schedule an appointment.  3. Class 3 severe obesity with serious comorbidity and body mass index (BMI) greater than or equal to 70 in adult, unspecified obesity type ([place]) Alexandra Beck is currently in the action stage of change. As such, her goal is to continue with weight loss efforts. She has agreed to the Category 4 Plan and keeping a food journal and adhering to recommended goals of 550-700 calories and 45+ grams of protein at supper.   Exercise goals: No exercise has been prescribed at this time.  Behavioral modification strategies: increasing lean protein intake, increasing vegetables, meal planning and cooking strategies, keeping healthy foods in the home and planning for success.  Alexandra Beck has agreed to follow-up with our clinic in 2 weeks. She was informed of the importance of frequent follow-up visits to maximize her success with intensive lifestyle modifications for her multiple health conditions.   Objective:   Blood pressure 134/73, pulse 64, temperature 98.7 F (37.1 C), temperature source Oral, height  5\' 2"  (1.575 m), weight (!) 422 lb (191.4 kg), last menstrual period 02/14/2020, SpO2 93 %. Body mass index is 77.18 kg/m.  General: Cooperative, alert, well developed, in no acute distress. HEENT: Conjunctivae and lids unremarkable. Cardiovascular: Regular rhythm.  Lungs: Normal work of breathing. Neurologic: No focal deficits.   Lab Results  Component Value Date    CREATININE 0.75 12/17/2019   BUN 20 12/17/2019   NA 142 12/17/2019   K 4.3 12/17/2019   CL 103 12/17/2019   CO2 26 12/17/2019   Lab Results  Component Value Date   ALT 18 12/17/2019   AST 16 12/17/2019   ALKPHOS 82 12/17/2019   BILITOT 0.3 12/17/2019   Lab Results  Component Value Date   HGBA1C 5.6 12/17/2019   HGBA1C 5.6 07/04/2019   HGBA1C 5.4 12/04/2018   HGBA1C 5.8 (H) 07/25/2018   HGBA1C 6.5 03/23/2018   Lab Results  Component Value Date   INSULIN 17.1 12/17/2019   INSULIN 23.5 07/04/2019   INSULIN 25.8 (H) 12/04/2018   INSULIN 24.7 07/25/2018   INSULIN 25.0 (H) 04/17/2018   Lab Results  Component Value Date   TSH 1.980 07/04/2019   Lab Results  Component Value Date   CHOL 194 12/17/2019   HDL 47 12/17/2019   LDLCALC 106 (H) 12/17/2019   LDLDIRECT 141.0 03/02/2016   TRIG 240 (H) 12/17/2019   CHOLHDL 4 03/23/2018   Lab Results  Component Value Date   WBC 5.9 07/25/2018   HGB 14.8 07/25/2018   HCT 44.8 07/25/2018   MCV 88 07/25/2018   PLT 230.0 03/23/2018   Attestation Statements:   Reviewed by clinician on day of visit: allergies, medications, problem list, medical history, surgical history, family history, social history, and previous encounter notes.  Time spent on visit including pre-visit chart review and post-visit care and charting was 17 minutes.   I, Water quality scientist, CMA, am acting as transcriptionist for Coralie Common, MD.  I have reviewed the above documentation for accuracy and completeness, and I agree with the above. - [name] Qua, MD

## 2020-02-21 ENCOUNTER — Encounter (INDEPENDENT_AMBULATORY_CARE_PROVIDER_SITE_OTHER): Payer: Self-pay | Admitting: Family Medicine

## 2020-02-24 NOTE — Telephone Encounter (Signed)
Please advise 

## 2020-03-05 ENCOUNTER — Encounter (INDEPENDENT_AMBULATORY_CARE_PROVIDER_SITE_OTHER): Payer: Self-pay | Admitting: Family Medicine

## 2020-03-05 ENCOUNTER — Ambulatory Visit (INDEPENDENT_AMBULATORY_CARE_PROVIDER_SITE_OTHER): Payer: BC Managed Care – PPO | Admitting: Family Medicine

## 2020-03-05 ENCOUNTER — Other Ambulatory Visit: Payer: Self-pay

## 2020-03-05 ENCOUNTER — Ambulatory Visit (INDEPENDENT_AMBULATORY_CARE_PROVIDER_SITE_OTHER): Payer: BC Managed Care – PPO | Admitting: Psychologist

## 2020-03-05 VITALS — BP 130/63 | HR 70 | Temp 98.1°F | Ht 62.0 in | Wt >= 6400 oz

## 2020-03-05 DIAGNOSIS — Z9189 Other specified personal risk factors, not elsewhere classified: Secondary | ICD-10-CM

## 2020-03-05 DIAGNOSIS — F330 Major depressive disorder, recurrent, mild: Secondary | ICD-10-CM | POA: Diagnosis not present

## 2020-03-05 DIAGNOSIS — E559 Vitamin D deficiency, unspecified: Secondary | ICD-10-CM | POA: Diagnosis not present

## 2020-03-05 DIAGNOSIS — I5042 Chronic combined systolic (congestive) and diastolic (congestive) heart failure: Secondary | ICD-10-CM

## 2020-03-05 DIAGNOSIS — F419 Anxiety disorder, unspecified: Secondary | ICD-10-CM

## 2020-03-05 DIAGNOSIS — I10 Essential (primary) hypertension: Secondary | ICD-10-CM

## 2020-03-05 DIAGNOSIS — I5032 Chronic diastolic (congestive) heart failure: Secondary | ICD-10-CM

## 2020-03-05 DIAGNOSIS — Z6841 Body Mass Index (BMI) 40.0 and over, adult: Secondary | ICD-10-CM

## 2020-03-05 DIAGNOSIS — E66813 Obesity, class 3: Secondary | ICD-10-CM

## 2020-03-05 DIAGNOSIS — F329 Major depressive disorder, single episode, unspecified: Secondary | ICD-10-CM

## 2020-03-05 MED ORDER — VITAMIN D (ERGOCALCIFEROL) 1.25 MG (50000 UNIT) PO CAPS: 50000.0000 [IU] | ORAL_CAPSULE | ORAL | 0 refills | Status: DC

## 2020-03-05 MED ORDER — VORTIOXETINE HBR 20 MG PO TABS: 20.0000 mg | ORAL_TABLET | Freq: Every day | ORAL | 0 refills | Status: DC

## 2020-03-05 NOTE — Progress Notes (Signed)
Chief Complaint:   OBESITY Alexandra Beck is here to discuss her progress with her obesity treatment plan along with follow-up of her obesity related diagnoses. Alexandra Beck is on the Category 4 Plan and keeping a food journal and adhering to recommended goals of 550-700 calories and 45+ grams of protein at supper and states she is following her eating plan approximately 92% of the time. Alexandra Beck states she is exercising for 0 minutes 0 times per week.  Today's visit was #: 12 Starting weight: 468 lbs Starting date: 04/17/2018 Today's weight: 427 lbs Today's date: 03/05/2020 Total lbs lost to date: 41 lbs Total lbs lost since last in-office visit: 0  Interim History: Alexandra Beck reports feeling like she is retaining quite a bit of fluid (scale estimates a fat percentage decrease of 71.7% to 65.3%).  Alexandra Beck's wife journaled the dinner meal last week and didn't have many calories to go along with the meal.  She has been making alterations in meals in order to stay within her calories.  Subjective:   1. Vitamin D deficiency Madelein's Vitamin D level was 38.2 on 12/17/2019. She is currently taking prescription vitamin D 50,000 IU each week. She denies nausea, vomiting or muscle weakness.  She endorses fatigue.  2. Anxiety and depression No SI/HI.  We previously discussed starting psychotherapy.  3. At risk for osteoporosis Alexandra Beck is at higher risk of osteopenia and osteoporosis due to Vitamin D deficiency.   Assessment/Plan:   1. Vitamin D deficiency Low Vitamin D level contributes to fatigue and are associated with obesity, breast, and colon cancer. She agrees to continue to take prescription Vitamin D @50 ,000 IU every week and will follow-up for routine testing of Vitamin D, at least 2-3 times per year to avoid over-replacement. - Vitamin D, Ergocalciferol, (DRISDOL) 1.25 MG (50000 UNIT) CAPS capsule; Take 1 capsule (50,000 Units total) by mouth every 7 (seven) days.  Dispense: 4 capsule; Refill: 0  2. Anxiety  and depression Rayven has an appointment with her psychiatrist today at 3 pm.  Will refill Trintellix today.  - vortioxetine HBr (TRINTELLIX) 20 MG TABS tablet; Take 1 tablet (20 mg total) by mouth daily.  Dispense: 30 tablet; Refill: 0  3. At risk for osteoporosis Alexandra Beck was given approximately 15 minutes of osteoporosis prevention counseling today. Alexandra Beck is at risk for osteopenia and osteoporosis due to her Vitamin D deficiency. She was encouraged to take her Vitamin D and follow her higher calcium diet and increase strengthening exercise to help strengthen her bones and decrease her risk of osteopenia and osteoporosis.  Repetitive spaced learning was employed today to elicit superior memory formation and behavioral change.  4. Class 3 severe obesity with serious comorbidity and body mass index (BMI) greater than or equal to 70 in adult, unspecified obesity type (Alexandra Beck) Alexandra Beck is currently in the action stage of change. As such, her goal is to continue with weight loss efforts. She has agreed to the Category 4 Plan and keeping a food journal and adhering to recommended goals of 550-700 calories and 45+ grams of protein at supper.   Exercise goals: As is.  Behavioral modification strategies: increasing lean protein intake, increasing vegetables, meal planning and cooking strategies, keeping healthy foods in the home and planning for success.  Alexandra Beck has agreed to follow-up with our clinic in 2 weeks. She was informed of the importance of frequent follow-up visits to maximize her success with intensive lifestyle modifications for her multiple health conditions.   Objective:   Blood  pressure 130/63, pulse 70, temperature 98.1 F (36.7 C), temperature source Oral, height 5\' 2"  (1.575 m), weight (!) 427 lb (193.7 kg), last menstrual period 02/14/2020, SpO2 95 %. Body mass index is 78.1 kg/m.  General: Cooperative, alert, well developed, in no acute distress. HEENT: Conjunctivae and lids  unremarkable. Cardiovascular: Regular rhythm.  Lungs: Normal work of breathing. Neurologic: No focal deficits.   Lab Results  Component Value Date   CREATININE 0.75 12/17/2019   BUN 20 12/17/2019   NA 142 12/17/2019   K 4.3 12/17/2019   CL 103 12/17/2019   CO2 26 12/17/2019   Lab Results  Component Value Date   ALT 18 12/17/2019   AST 16 12/17/2019   ALKPHOS 82 12/17/2019   BILITOT 0.3 12/17/2019   Lab Results  Component Value Date   HGBA1C 5.6 12/17/2019   HGBA1C 5.6 07/04/2019   HGBA1C 5.4 12/04/2018   HGBA1C 5.8 (H) 07/25/2018   HGBA1C 6.5 03/23/2018   Lab Results  Component Value Date   INSULIN 17.1 12/17/2019   INSULIN 23.5 07/04/2019   INSULIN 25.8 (H) 12/04/2018   INSULIN 24.7 07/25/2018   INSULIN 25.0 (H) 04/17/2018   Lab Results  Component Value Date   TSH 1.980 07/04/2019   Lab Results  Component Value Date   CHOL 194 12/17/2019   HDL 47 12/17/2019   LDLCALC 106 (H) 12/17/2019   LDLDIRECT 141.0 03/02/2016   TRIG 240 (H) 12/17/2019   CHOLHDL 4 03/23/2018   Lab Results  Component Value Date   WBC 5.9 07/25/2018   HGB 14.8 07/25/2018   HCT 44.8 07/25/2018   MCV 88 07/25/2018   PLT 230.0 03/23/2018   Attestation Statements:   Reviewed by clinician on day of visit: allergies, medications, problem list, medical history, surgical history, family history, social history, and previous encounter notes.  I, Water quality scientist, CMA, am acting as transcriptionist for Coralie Common, MD.  I have reviewed the above documentation for accuracy and completeness, and I agree with the above. - [name] Qua, MD

## 2020-03-09 MED ORDER — TORSEMIDE 20 MG PO TABS: 40.0000 mg | ORAL_TABLET | Freq: Every day | ORAL | 0 refills | Status: DC

## 2020-03-09 MED ORDER — SPIRONOLACTONE 25 MG PO TABS: 25.0000 mg | ORAL_TABLET | Freq: Every day | ORAL | 0 refills | Status: DC

## 2020-03-09 NOTE — Telephone Encounter (Signed)
Dr. Meda Coffee, pt wants to know if she should continue taking her potassium supplement, being you called her in torsemide and spironolactone. Please advise!

## 2020-03-09 NOTE — Telephone Encounter (Signed)
Alexandra Spark, MD  [name] E 1 hour ago (10:54 AM)   No KCL, we will plan for BMP and BNP in 2 weeks, thank you   Pt is aware that per Dr. Meda Coffee, she should stop KCL and she would like for her to have a BMET and PRO-BNP done in 2 weeks.  Pt is requesting having her labs done at a different clinic, but Dr. Meda Coffee will be able to see them in Epic.  Asked the pt to confirm if they will actually draw both a BMET and PRO-BNP, for it was explained to her that this will assess two different things.  Will await for the pt to respond back before placing these lab orders accordingly.  KCL was discontinued from her med list.

## 2020-03-09 NOTE — Addendum Note (Signed)
Addended by: Nuala Alpha on: 03/09/2020 12:07 PM   Modules accepted: Orders

## 2020-03-09 NOTE — Telephone Encounter (Signed)
Alexandra Spark, MD  [name] E 11 hours ago (9:07 PM)   Hi! We can switch to torsemide 40 mg po daily and spironolactone 25 mg po daily. Also, you have to make at least 10.000 steps every day to make sure keep circulation and lymp drainage in your legs otherwise no diuretics are effective.   Pt made aware via mychart to stop her lasix, and start torsemide 40 mg po daily and spironolactone 25 mg po daily.  These were sent to her pharmacy on file.  Pt is aware of other recommendations from Dr. Meda Coffee, via [place] message.

## 2020-03-10 NOTE — Addendum Note (Signed)
Addended by: Nuala Alpha on: 03/10/2020 08:29 AM   Modules accepted: Orders

## 2020-03-17 ENCOUNTER — Ambulatory Visit (INDEPENDENT_AMBULATORY_CARE_PROVIDER_SITE_OTHER): Payer: BC Managed Care – PPO | Admitting: Psychologist

## 2020-03-17 DIAGNOSIS — F330 Major depressive disorder, recurrent, mild: Secondary | ICD-10-CM | POA: Diagnosis not present

## 2020-03-25 ENCOUNTER — Other Ambulatory Visit: Payer: Self-pay | Admitting: Pulmonary Disease

## 2020-03-26 ENCOUNTER — Ambulatory Visit (INDEPENDENT_AMBULATORY_CARE_PROVIDER_SITE_OTHER): Payer: BC Managed Care – PPO | Admitting: Psychologist

## 2020-03-26 ENCOUNTER — Encounter (INDEPENDENT_AMBULATORY_CARE_PROVIDER_SITE_OTHER): Payer: Self-pay | Admitting: Family Medicine

## 2020-03-26 ENCOUNTER — Ambulatory Visit (INDEPENDENT_AMBULATORY_CARE_PROVIDER_SITE_OTHER): Payer: BC Managed Care – PPO | Admitting: Family Medicine

## 2020-03-26 ENCOUNTER — Other Ambulatory Visit: Payer: Self-pay

## 2020-03-26 VITALS — BP 129/70 | HR 76 | Temp 98.5°F | Ht 62.0 in | Wt >= 6400 oz

## 2020-03-26 DIAGNOSIS — E66813 Obesity, class 3: Secondary | ICD-10-CM

## 2020-03-26 DIAGNOSIS — Z6841 Body Mass Index (BMI) 40.0 and over, adult: Secondary | ICD-10-CM

## 2020-03-26 DIAGNOSIS — F330 Major depressive disorder, recurrent, mild: Secondary | ICD-10-CM | POA: Diagnosis not present

## 2020-03-26 DIAGNOSIS — Z9189 Other specified personal risk factors, not elsewhere classified: Secondary | ICD-10-CM

## 2020-03-26 DIAGNOSIS — E559 Vitamin D deficiency, unspecified: Secondary | ICD-10-CM

## 2020-03-26 MED ORDER — VITAMIN D (ERGOCALCIFEROL) 1.25 MG (50000 UNIT) PO CAPS: 50000.0000 [IU] | ORAL_CAPSULE | ORAL | 0 refills | Status: DC

## 2020-03-26 NOTE — Progress Notes (Signed)
Chief Complaint:   OBESITY Alexandra Beck is here to discuss her progress with her obesity treatment plan along with follow-up of her obesity related diagnoses. Alexandra Beck is on the Category 4 Plan and keeping a food journal and adhering to recommended goals of 550-700 calories and 45 grams of protein at supper and states she is following her eating plan approximately 88% of the time. Alexandra Beck states she is exercising for 0 minutes 0 times per week.  Today's visit was #: 20 Starting weight: 468 lbs Starting date: 04/17/2018 Today's weight: 418 lbs Today's date: 03/26/2020 Total lbs lost to date: 50 lbs Total lbs lost since last in-office visit: 9 lbs  Interim History: Alexandra Beck says that the last few weeks have been good.  She switched her diuretic to torsemide and has had great results with fluid output.  She indulged for her wife's birthday but hasn't been eating very indulgently afterwards.  She says she is feeling less "puffy".  She says the anniversary of there wife's mother's death is coming up.  Subjective:   1. Vitamin D deficiency Alexandra Beck's Vitamin D level was 38.2 on 12/17/2019. She is currently taking prescription vitamin D 50,000 IU each week. She denies nausea, vomiting or muscle weakness.  She endorses fatigue.  2. At risk for osteoporosis Alexandra Beck is at higher risk of osteopenia and osteoporosis due to Vitamin D deficiency.   Assessment/Plan:   1. Vitamin D deficiency Low Vitamin D level contributes to fatigue and are associated with obesity, breast, and colon cancer. She agrees to continue to take prescription Vitamin D @50 ,000 IU every week and will follow-up for routine testing of Vitamin D, at least 2-3 times per year to avoid over-replacement. - Vitamin D, Ergocalciferol, (DRISDOL) 1.25 MG (50000 UNIT) CAPS capsule; Take 1 capsule (50,000 Units total) by mouth every 7 (seven) days.  Dispense: 4 capsule; Refill: 0 - Pro b natriuretic peptide (BNP) - Basic Metabolic Panel (BMET)  2. At  risk for osteoporosis Alexandra Beck was given approximately 15 minutes of osteoporosis prevention counseling today. Alexandra Beck is at risk for osteopenia and osteoporosis due to her Vitamin D deficiency. She was encouraged to take her Vitamin D and follow her higher calcium diet and increase strengthening exercise to help strengthen her bones and decrease her risk of osteopenia and osteoporosis.  Repetitive spaced learning was employed today to elicit superior memory formation and behavioral change.  3. Class 3 severe obesity with serious comorbidity and body mass index (BMI) greater than or equal to 70 in adult, unspecified obesity type (Alexandra Beck) Alexandra Beck is currently in the action stage of change. As such, her goal is to continue with weight loss efforts. She has agreed to the Category 4 Plan and keeping a food journal and adhering to recommended goals of 550-700 calories and 45+ grams of protein at supper.   Exercise goals: No exercise has been prescribed at this time.  Behavioral modification strategies: increasing lean protein intake, meal planning and cooking strategies, planning for success and keeping a strict food journal.  Alexandra Beck has agreed to follow-up with our clinic in 3 weeks. She was informed of the importance of frequent follow-up visits to maximize her success with intensive lifestyle modifications for her multiple health conditions.   Alexandra Beck was informed we would discuss her lab results at her next visit unless there is a critical issue that needs to be addressed sooner. Alexandra Beck agreed to keep her next visit at the agreed upon time to discuss these results.  Objective:  Blood pressure 129/70, pulse 76, temperature 98.5 F (36.9 C), temperature source Oral, height 5\' 2"  (1.575 m), weight (!) 418 lb (189.6 kg), last menstrual period 03/17/2020, SpO2 92 %. Body mass index is 76.45 kg/m.  General: Cooperative, alert, well developed, in no acute distress. HEENT: Conjunctivae and lids  unremarkable. Cardiovascular: Regular rhythm.  Lungs: Normal work of breathing. Neurologic: No focal deficits.   Lab Results  Component Value Date   CREATININE 0.75 12/17/2019   BUN 20 12/17/2019   NA 142 12/17/2019   K 4.3 12/17/2019   CL 103 12/17/2019   CO2 26 12/17/2019   Lab Results  Component Value Date   ALT 18 12/17/2019   AST 16 12/17/2019   ALKPHOS 82 12/17/2019   BILITOT 0.3 12/17/2019   Lab Results  Component Value Date   HGBA1C 5.6 12/17/2019   HGBA1C 5.6 07/04/2019   HGBA1C 5.4 12/04/2018   HGBA1C 5.8 (H) 07/25/2018   HGBA1C 6.5 03/23/2018   Lab Results  Component Value Date   INSULIN 17.1 12/17/2019   INSULIN 23.5 07/04/2019   INSULIN 25.8 (H) 12/04/2018   INSULIN 24.7 07/25/2018   INSULIN 25.0 (H) 04/17/2018   Lab Results  Component Value Date   TSH 1.980 07/04/2019   Lab Results  Component Value Date   CHOL 194 12/17/2019   HDL 47 12/17/2019   LDLCALC 106 (H) 12/17/2019   LDLDIRECT 141.0 03/02/2016   TRIG 240 (H) 12/17/2019   CHOLHDL 4 03/23/2018   Lab Results  Component Value Date   WBC 5.9 07/25/2018   HGB 14.8 07/25/2018   HCT 44.8 07/25/2018   MCV 88 07/25/2018   PLT 230.0 03/23/2018   Attestation Statements:   Reviewed by clinician on day of visit: allergies, medications, problem list, medical history, surgical history, family history, social history, and previous encounter notes.  I, Water quality scientist, CMA, am acting as transcriptionist for Coralie Common, MD.  I have reviewed the above documentation for accuracy and completeness, and I agree with the above. - Jinny Blossom, MD

## 2020-03-26 NOTE — Telephone Encounter (Signed)
Dr. Alva, please advise if you are okay refilling med. 

## 2020-03-27 ENCOUNTER — Telehealth: Payer: Self-pay | Admitting: Pulmonary Disease

## 2020-03-27 LAB — BASIC METABOLIC PANEL
BUN/Creatinine Ratio: 31 — ABNORMAL HIGH (ref 9–23)
BUN: 31 mg/dL — ABNORMAL HIGH (ref 6–20)
CO2: 25 mmol/L (ref 20–29)
Calcium: 10.2 mg/dL (ref 8.7–10.2)
Chloride: 98 mmol/L (ref 96–106)
Creatinine, Ser: 0.99 mg/dL (ref 0.57–1.00)
GFR calc Af Amer: 83 mL/min/{1.73_m2} (ref >59)
GFR calc non Af Amer: 72 mL/min/{1.73_m2} (ref >59)
Glucose: 102 mg/dL — ABNORMAL HIGH (ref 65–99)
Potassium: 4.4 mmol/L (ref 3.5–5.2)
Sodium: 141 mmol/L (ref 134–144)

## 2020-03-27 LAB — PRO B NATRIURETIC PEPTIDE: NT-Pro BNP: 145 pg/mL — ABNORMAL HIGH (ref 0–130)

## 2020-03-27 MED ORDER — MODAFINIL 100 MG PO TABS: 300.0000 mg | ORAL_TABLET | ORAL | 0 refills | Status: DC

## 2020-03-27 NOTE — Telephone Encounter (Signed)
15 day refill sent.

## 2020-03-27 NOTE — Telephone Encounter (Signed)
Needs OV with APP to review & refill

## 2020-03-27 NOTE — Telephone Encounter (Signed)
RA, please advise if you are ok with refilling her Provigil since she is now scheduled for a follow up for next week. Thanks.

## 2020-03-27 NOTE — Telephone Encounter (Signed)
Refill request was received electronically from pt's pharmacy for pt's provigil. This request was denied by Dr. Elsworth Soho as pt has not been seen since 10/12/18. Dr. Elsworth Soho stated that pt needs to have an OV with an APP prior to med being able to be refilled.  Attempted to call pt but unable to reach. Left message for pt to return call.  When pt returns call, can you please schedule pt a f/u appt with APP. At her f/u, her med can be refilled.

## 2020-03-27 NOTE — Telephone Encounter (Signed)
ATC patient left detailed message per patients DPR letting her know that refill has been sent in for her and encouraged her to keep upcoming appointment with our office. Nothing further needed at this time.

## 2020-03-28 NOTE — Progress Notes (Signed)
Alexandra Beck got her labs done at my office (these were the results after starting torsemide and spironolactone).  Dr. Jearld Shines

## 2020-04-03 ENCOUNTER — Encounter: Payer: Self-pay | Admitting: Primary Care

## 2020-04-03 ENCOUNTER — Ambulatory Visit (INDEPENDENT_AMBULATORY_CARE_PROVIDER_SITE_OTHER): Payer: BC Managed Care – PPO | Admitting: Primary Care

## 2020-04-03 ENCOUNTER — Other Ambulatory Visit: Payer: Self-pay

## 2020-04-03 DIAGNOSIS — Z6841 Body Mass Index (BMI) 40.0 and over, adult: Secondary | ICD-10-CM

## 2020-04-03 DIAGNOSIS — G4733 Obstructive sleep apnea (adult) (pediatric): Secondary | ICD-10-CM | POA: Diagnosis not present

## 2020-04-03 MED ORDER — MODAFINIL 100 MG PO TABS: 300.0000 mg | ORAL_TABLET | ORAL | 3 refills | Status: DC

## 2020-04-03 NOTE — Patient Instructions (Addendum)
Orders: Renew cpap supplies, continue pressure setting at 15cm h20  Needs new headgear every 4 months   Recommend: Continues to wear CPAP every night 4-6 hours or more Continue to encourage routine sleep schedule Do not drive if experiencing excessive daytime fatigue or somnolence   Follow-up: 1 year with Dr. Elsworth Soho    CPAP and BPAP Information CPAP and BPAP are methods of helping a person breathe with the use of air pressure. CPAP stands for "continuous positive airway pressure." BPAP stands for "bi-level positive airway pressure." In both methods, air is blown through your nose or mouth and into your air passages to help you breathe well. CPAP and BPAP use different amounts of pressure to blow air. With CPAP, the amount of pressure stays the same while you breathe in and out. With BPAP, the amount of pressure is increased when you breathe in (inhale) so that you can take larger breaths. Your health care provider will recommend whether CPAP or BPAP would be more helpful for you. Why are CPAP and BPAP treatments used? CPAP or BPAP can be helpful if you have:  Sleep apnea.  Chronic obstructive pulmonary disease (COPD).  Heart failure.  Medical conditions that weaken the muscles of the chest including muscular dystrophy, or neurological diseases such as amyotrophic lateral sclerosis (ALS).  Other problems that cause breathing to be weak, abnormal, or difficult. CPAP is most commonly used for obstructive sleep apnea (OSA) to keep the airways from collapsing when the muscles relax during sleep. How is CPAP or BPAP administered? Both CPAP and BPAP are provided by a small machine with a flexible plastic tube that attaches to a plastic mask. You wear the mask. Air is blown through the mask into your nose or mouth. The amount of pressure that is used to blow the air can be adjusted on the machine. Your health care provider will determine the pressure setting that should be used based on your  individual needs. When should CPAP or BPAP be used? In most cases, the mask only needs to be worn during sleep. Generally, the mask needs to be worn throughout the night and during any daytime naps. People with certain medical conditions may also need to wear the mask at other times when they are awake. Follow instructions from your health care provider about when to use the machine. What are some tips for using the mask?   Because the mask needs to be snug, some people feel trapped or closed-in (claustrophobic) when first using the mask. If you feel this way, you may need to get used to the mask. One way to do this is by holding the mask loosely over your nose or mouth and then gradually applying the mask more snugly. You can also gradually increase the amount of time that you use the mask.  Masks are available in various types and sizes. Some fit over your mouth and nose while others fit over just your nose. If your mask does not fit well, talk with your health care provider about getting a different one.  If you are using a mask that fits over your nose and you tend to breathe through your mouth, a chin strap may be applied to help keep your mouth closed.  The CPAP and BPAP machines have alarms that may sound if the mask comes off or develops a leak.  If you have trouble with the mask, it is very important that you talk with your health care provider about finding a way  to make the mask easier to tolerate. Do not stop using the mask. Stopping the use of the mask could have a negative impact on your health. What are some tips for using the machine?  Place your CPAP or BPAP machine on a secure table or stand near an electrical outlet.  Know where the on/off switch is located on the machine.  Follow instructions from your health care provider about how to set the pressure on your machine and when you should use it.  Do not eat or drink while the CPAP or BPAP machine is on. Food or fluids could  get pushed into your lungs by the pressure of the CPAP or BPAP.  Do not smoke. Tobacco smoke residue can damage the machine.  For home use, CPAP and BPAP machines can be rented or purchased through home health care companies. Many different brands of machines are available. Renting a machine before purchasing may help you find out which particular machine works well for you.  Keep the CPAP or BPAP machine and attachments clean. Ask your health care provider for specific instructions. Get help right away if:  You have redness or open areas around your nose or mouth where the mask fits.  You have trouble using the CPAP or BPAP machine.  You cannot tolerate wearing the CPAP or BPAP mask.  You have pain, discomfort, and bloating in your abdomen. Summary  CPAP and BPAP are methods of helping a person breathe with the use of air pressure.  Both CPAP and BPAP are provided by a small machine with a flexible plastic tube that attaches to a plastic mask.  If you have trouble with the mask, it is very important that you talk with your health care provider about finding a way to make the mask easier to tolerate. This information is not intended to replace advice given to you by your health care provider. Make sure you discuss any questions you have with your health care provider. Document Revised: 03/06/2019 Document Reviewed: 10/03/2016 Elsevier Patient Education  [place].

## 2020-04-03 NOTE — Progress Notes (Signed)
Virtual Visit via Telephone Note  I connected with Duwayne Heck on 04/03/20 at 11:30 AM EDT by telephone and verified that I am speaking with the correct person using two identifiers.  Location: Patient: Home Provider: Office   I discussed the limitations, risks, security and privacy concerns of performing an evaluation and management service by telephone and the availability of in person appointments. I also discussed with the patient that there may be a patient responsible charge related to this service. The patient expressed understanding and agreed to proceed.   History of Present Illness:  39 year old female, former smoker. PMH significant for OSA, obesity hypoventilation syndrome, narcolepsy without cataplexy, delayed sleep phase, chronic diastolic heart failure. Patient of Dr. Elsworth Soho. NPSG 2009: AHI 93/hr. CPAP titration 05/2018 >> CPAP 15 + 3L O2 (434 lbs ). Emphasized importance of sleep routine and scheduled bed/wake time. Enc bedtime around 1-2 AM. Recommended light exposure in the morning. Maintained on Provigil 100mg  three times daily. Continues CPAP at 15cm H20.   06/06/2019 Patient contacted today for video visit for OSA follow-up. She is doing well. Compliant with CPAP, wearing every night. No issues with pressure setting, does lose mask seal. States that she is not the best at replacing CPAP parts. Reports benefit from Provigil medication.  Typically works 1pm-10pm but since [place] pandemic she has been working 10am-7pm. She still goes to bed around 1:30-2am and wakes up around 9-9:30am. She still has a hard time getting up in the moring but medication allows her to stay awake. Takes 300mg  at once in the morning. She can feel a difference if she forgets to take, states that she is ready to fall asleep. [facility]- has an apt next Thursday.   Airview download: 30/30 day used; 93% >4 hours Average usage 6 hours 46 mins Pressure 15cm h20 Leaks 79.9  L/min (95%) AHI 6.1  04/03/2020 Patient contacted today for annual follow-up. She just replaced mask, uses full face mask. The velco on her headgear will loosen. She has some airleaks. She is sleeping ok at night. No trouble falling or staying asleep. She has been trying to go to bed earlier. She works as a [place] from home current on [employer]. She gets off of work at [place] to get to bed before 1am and wakes up around 9am. She continues to take modafinil 300mg  in the morning. She has been on CPAP for approx 10 years. She continues to work on weight loss. She has lost approx 50-60 lbs with weight loss clinic.   Airview download: 30/30 days used (100%); 29 days > 4 hours Average usage 7 hours 26 mins Pressure 15cm h20 Air leaks 44.8L/min (95%) AHI 4.1    Observations/Objective:  - Able to speak in full sentences, no shortness of breath, wheezing or cough  Assessment and Plan:  OSA: - 100% compliant with CPAP and reports benefit from use - Pressure 15cm h20; AHI 4.1  - Renew CPAP supplies, no changes to settings  - Continue to encourage routine sleep schedule - Refill Modafinil 300mg  every morning (PMP reviewed, no unexpected prescriptions found)  Obesity: - Following with healthy weight and wellness  - She has lost 50 lbs   Follow Up Instructions:  -1 year with Dr. Elsworth Soho  I discussed the assessment and treatment plan with the patient. The patient was provided an opportunity to ask questions and all were answered. The patient agreed with the plan and demonstrated an understanding of the  instructions.   The patient was advised to call back or seek an in-person evaluation if the symptoms worsen or if the condition fails to improve as anticipated.  I provided 18 minutes of non-face-to-face time during this encounter.   Martyn Ehrich, NP

## 2020-04-11 ENCOUNTER — Other Ambulatory Visit (INDEPENDENT_AMBULATORY_CARE_PROVIDER_SITE_OTHER): Payer: Self-pay | Admitting: Family Medicine

## 2020-04-11 DIAGNOSIS — F32A Depression, unspecified: Secondary | ICD-10-CM

## 2020-04-16 ENCOUNTER — Encounter (INDEPENDENT_AMBULATORY_CARE_PROVIDER_SITE_OTHER): Payer: Self-pay | Admitting: Family Medicine

## 2020-04-16 ENCOUNTER — Other Ambulatory Visit: Payer: Self-pay

## 2020-04-16 ENCOUNTER — Ambulatory Visit (INDEPENDENT_AMBULATORY_CARE_PROVIDER_SITE_OTHER): Payer: BC Managed Care – PPO | Admitting: Family Medicine

## 2020-04-16 VITALS — BP 146/67 | Temp 98.4°F | Ht 62.0 in | Wt >= 6400 oz

## 2020-04-16 DIAGNOSIS — E559 Vitamin D deficiency, unspecified: Secondary | ICD-10-CM

## 2020-04-16 DIAGNOSIS — Z9189 Other specified personal risk factors, not elsewhere classified: Secondary | ICD-10-CM | POA: Diagnosis not present

## 2020-04-16 DIAGNOSIS — Z6841 Body Mass Index (BMI) 40.0 and over, adult: Secondary | ICD-10-CM

## 2020-04-16 DIAGNOSIS — F3289 Other specified depressive episodes: Secondary | ICD-10-CM | POA: Diagnosis not present

## 2020-04-16 MED ORDER — VITAMIN D (ERGOCALCIFEROL) 1.25 MG (50000 UNIT) PO CAPS: 50000.0000 [IU] | ORAL_CAPSULE | ORAL | 0 refills | Status: DC

## 2020-04-16 MED ORDER — VORTIOXETINE HBR 20 MG PO TABS: 20.0000 mg | ORAL_TABLET | Freq: Every day | ORAL | 0 refills | Status: DC

## 2020-04-20 NOTE — Progress Notes (Signed)
Chief Complaint:   OBESITY Alexandra Beck is here to discuss her progress with her obesity treatment plan along with follow-up of her obesity related diagnoses. Alexandra Beck is on the Category 4 Plan and keeping a food journal and adhering to recommended goals of 550-700 calories and 45 grams of  protein and states she is following her eating plan approximately 65% of the time. Alexandra Beck states she is exercising for 0 minutes 0 times per week.  Today's visit was #: 37 Starting weight: 468 lbs Starting date: 04/17/2018 Today's weight: 422 lbs Today's date: 04/16/2020 Total lbs lost to date: 46 lbs Total lbs lost since last in-office visit: 0  Interim History: Alexandra Beck says she had a difficulty few weeks.  She is disappointed with the last 2 weeks.  She has that during the past 2 days she and her wife have been eating the dinner that they planned, but she has been skipping breakfast.  The week prior, she says that the meals were scattered and not necessarily the most well planned.  She does not feel like eating breakfast very often.  She has food on the plan in the house.  Subjective:   1. Vitamin D deficiency Alexandra Beck's Vitamin D level was 38.2 on 1/192021. She is currently taking prescription vitamin D 50,000 IU each week. She denies nausea, vomiting or muscle weakness.  She endorses fatigue.  2. Other depression,with emotional eating  Alexandra Beck is struggling with emotional eating and using food for comfort to the extent that it is negatively impacting her health. She has been working on behavior modification techniques to help reduce her emotional eating and has been somewhat successful. She shows no sign of suicidal or homicidal ideations. She says her symptoms are better controlled on Trintellix with no sexual side effects.   3. At risk for osteoporosis Alexandra Beck is at higher risk of osteopenia and osteoporosis due to Vitamin D deficiency.   Assessment/Plan:   1. Vitamin D deficiency Low Vitamin D level  contributes to fatigue and are associated with obesity, breast, and colon cancer. She agrees to continue to take prescription Vitamin D @50 ,000 IU every week and will follow-up for routine testing of Vitamin D, at least 2-3 times per year to avoid over-replacement. - Vitamin D, Ergocalciferol, (DRISDOL) 1.25 MG (50000 UNIT) CAPS capsule; Take 1 capsule (50,000 Units total) by mouth every 7 (seven) days.  Dispense: 4 capsule; Refill: 0  2. Other depression,with emotional eating  Behavior modification techniques were discussed today to help Ayslin deal with her emotional/non-hunger eating behaviors.  Orders and follow up as documented in patient record.  - vortioxetine HBr (TRINTELLIX) 20 MG TABS tablet; Take 1 tablet (20 mg total) by mouth daily.  Dispense: 30 tablet; Refill: 0  3. At risk for osteoporosis Alexandra Beck was given approximately 15 minutes of osteoporosis prevention counseling today. Alexandra Beck is at risk for osteopenia and osteoporosis due to her Vitamin D deficiency. She was encouraged to take her Vitamin D and follow her higher calcium diet and increase strengthening exercise to help strengthen her bones and decrease her risk of osteopenia and osteoporosis.  Repetitive spaced learning was employed today to elicit superior memory formation and behavioral change.  4. Class 3 severe obesity with serious comorbidity and body mass index (BMI) greater than or equal to 70 in adult, unspecified obesity type (Alexandra Beck) Alexandra Beck is currently in the action stage of change. As such, her goal is to continue with weight loss efforts. She has agreed to the Category 4  Plan.   Exercise goals: For substantial health benefits, adults should do at least 150 minutes (2 hours and 30 minutes) a week of moderate-intensity, or 75 minutes (1 hour and 15 minutes) a week of vigorous-intensity aerobic physical activity, or an equivalent combination of moderate- and vigorous-intensity aerobic activity. Aerobic activity should be  performed in episodes of at least 10 minutes, and preferably, it should be spread throughout the week.  Behavioral modification strategies: increasing lean protein intake, increasing vegetables, meal planning and cooking strategies, keeping healthy foods in the home and planning for success.  Alexandra Beck has agreed to follow-up with our clinic in 2 weeks. She was informed of the importance of frequent follow-up visits to maximize her success with intensive lifestyle modifications for her multiple health conditions.   Objective:   Blood pressure (!) 146/67, temperature 98.4 F (36.9 C), temperature source Oral, height 5\' 2"  (1.575 m), weight (!) 422 lb (191.4 kg), last menstrual period 03/17/2020, SpO2 95 %. Body mass index is 77.18 kg/m.  General: Cooperative, alert, well developed, in no acute distress. HEENT: Conjunctivae and lids unremarkable. Cardiovascular: Regular rhythm.  Lungs: Normal work of breathing. Neurologic: No focal deficits.   Lab Results  Component Value Date   CREATININE 0.99 03/26/2020   BUN 31 (H) 03/26/2020   NA 141 03/26/2020   K 4.4 03/26/2020   CL 98 03/26/2020   CO2 25 03/26/2020   Lab Results  Component Value Date   ALT 18 12/17/2019   AST 16 12/17/2019   ALKPHOS 82 12/17/2019   BILITOT 0.3 12/17/2019   Lab Results  Component Value Date   HGBA1C 5.6 12/17/2019   HGBA1C 5.6 07/04/2019   HGBA1C 5.4 12/04/2018   HGBA1C 5.8 (H) 07/25/2018   HGBA1C 6.5 03/23/2018   Lab Results  Component Value Date   INSULIN 17.1 12/17/2019   INSULIN 23.5 07/04/2019   INSULIN 25.8 (H) 12/04/2018   INSULIN 24.7 07/25/2018   INSULIN 25.0 (H) 04/17/2018   Lab Results  Component Value Date   TSH 1.980 07/04/2019   Lab Results  Component Value Date   CHOL 194 12/17/2019   HDL 47 12/17/2019   LDLCALC 106 (H) 12/17/2019   LDLDIRECT 141.0 03/02/2016   TRIG 240 (H) 12/17/2019   CHOLHDL 4 03/23/2018   Lab Results  Component Value Date   WBC 5.9 07/25/2018   HGB  14.8 07/25/2018   HCT 44.8 07/25/2018   MCV 88 07/25/2018   PLT 230.0 03/23/2018   Attestation Statements:   Reviewed by clinician on day of visit: allergies, medications, problem list, medical history, surgical history, family history, social history, and previous encounter notes.  I, Water quality scientist, CMA, am acting as transcriptionist for Coralie Common, MD.  I have reviewed the above documentation for accuracy and completeness, and I agree with the above. - Jinny Blossom, MD

## 2020-04-24 ENCOUNTER — Other Ambulatory Visit: Payer: Self-pay | Admitting: Family

## 2020-04-29 ENCOUNTER — Ambulatory Visit (INDEPENDENT_AMBULATORY_CARE_PROVIDER_SITE_OTHER): Payer: BC Managed Care – PPO | Admitting: Family Medicine

## 2020-05-03 ENCOUNTER — Telehealth: Payer: Self-pay | Admitting: Emergency Medicine

## 2020-05-03 NOTE — Telephone Encounter (Signed)
received a call about pt's provigil. She is in [place] and is apparently out, is unable to transfer her script from [facility].   Attempted to call her but poor connection. Will try again.    I spoke with the patient and then with the [facility] in [place]. They are going to transfer the refill for her.

## 2020-05-12 ENCOUNTER — Ambulatory Visit (INDEPENDENT_AMBULATORY_CARE_PROVIDER_SITE_OTHER): Payer: BC Managed Care – PPO | Admitting: Psychology

## 2020-05-12 DIAGNOSIS — F331 Major depressive disorder, recurrent, moderate: Secondary | ICD-10-CM

## 2020-05-15 ENCOUNTER — Encounter (INDEPENDENT_AMBULATORY_CARE_PROVIDER_SITE_OTHER): Payer: Self-pay | Admitting: Family Medicine

## 2020-05-18 ENCOUNTER — Other Ambulatory Visit (INDEPENDENT_AMBULATORY_CARE_PROVIDER_SITE_OTHER): Payer: Self-pay | Admitting: Family Medicine

## 2020-05-18 DIAGNOSIS — F3289 Other specified depressive episodes: Secondary | ICD-10-CM

## 2020-05-19 ENCOUNTER — Ambulatory Visit (INDEPENDENT_AMBULATORY_CARE_PROVIDER_SITE_OTHER): Payer: BC Managed Care – PPO | Admitting: Family Medicine

## 2020-05-27 ENCOUNTER — Ambulatory Visit (INDEPENDENT_AMBULATORY_CARE_PROVIDER_SITE_OTHER): Payer: BC Managed Care – PPO | Admitting: Psychology

## 2020-05-27 DIAGNOSIS — F331 Major depressive disorder, recurrent, moderate: Secondary | ICD-10-CM

## 2020-05-28 ENCOUNTER — Encounter (INDEPENDENT_AMBULATORY_CARE_PROVIDER_SITE_OTHER): Payer: Self-pay | Admitting: Family Medicine

## 2020-05-28 ENCOUNTER — Other Ambulatory Visit: Payer: Self-pay

## 2020-05-28 ENCOUNTER — Ambulatory Visit (INDEPENDENT_AMBULATORY_CARE_PROVIDER_SITE_OTHER): Payer: BC Managed Care – PPO | Admitting: Family Medicine

## 2020-05-28 VITALS — BP 150/77 | HR 63 | Temp 98.2°F | Ht 62.0 in | Wt >= 6400 oz

## 2020-05-28 DIAGNOSIS — E1165 Type 2 diabetes mellitus with hyperglycemia: Secondary | ICD-10-CM | POA: Diagnosis not present

## 2020-05-28 DIAGNOSIS — F418 Other specified anxiety disorders: Secondary | ICD-10-CM | POA: Diagnosis not present

## 2020-05-28 DIAGNOSIS — Z6841 Body Mass Index (BMI) 40.0 and over, adult: Secondary | ICD-10-CM

## 2020-05-28 DIAGNOSIS — Z9189 Other specified personal risk factors, not elsewhere classified: Secondary | ICD-10-CM | POA: Diagnosis not present

## 2020-05-28 MED ORDER — VORTIOXETINE HBR 20 MG PO TABS: 20.0000 mg | ORAL_TABLET | Freq: Every day | ORAL | 0 refills | Status: DC

## 2020-06-03 ENCOUNTER — Other Ambulatory Visit: Payer: Self-pay | Admitting: Cardiology

## 2020-06-03 ENCOUNTER — Other Ambulatory Visit: Payer: Self-pay | Admitting: Primary Care

## 2020-06-03 MED ORDER — MODAFINIL 100 MG PO TABS: ORAL_TABLET | ORAL | 3 refills | Status: DC

## 2020-06-03 NOTE — Progress Notes (Signed)
Chief Complaint:   OBESITY Alexandra Beck is here to discuss her progress with her obesity treatment plan along with follow-up of her obesity related diagnoses. Alexandra Beck is on the Category 4 Plan and states she is following her eating plan approximately 70% of the time. Alexandra Beck states she is doing 0 minutes 0 times per week.  Today's visit was #: 30 Starting weight: 468 lbs Starting date: 04/17/2018 Today's weight: 427 lbs Today's date: 05/28/2020 Total lbs lost to date: 41 Total lbs lost since last in-office visit: 0  Interim History: Alexandra Beck voices that she went to [place] since her last appointment (last appointment was 04/16/2020). Since returning from her trip, she has been mostly working and (possibly being back in the office). This past week has been more difficult and she hasn't been as on the plan as she would like. Her birthday is in 4 days, so she is planning on going out to eat.  Subjective:   1. Type 2 diabetes mellitus with hyperglycemia, without long-term current use of insulin (HCC) Alexandra Beck is on metformin daily. Last A1c was 5.6 and insulin level 17.1. She denies GI side effects, but she is still experiencing carbohydrate cravings.  2. Depression with anxiety Alexandra Beck is on Trintellix with some improvement in symptoms. She denies suicidal ideas or homicidal ideas.  3. At risk for hyperglycemia Alexandra Beck is at increased risk for hyperglycemia due to recent carbohydrate indulges.  Assessment/Plan:   1. Type 2 diabetes mellitus with hyperglycemia, without long-term current use of insulin (HCC) Good blood sugar control is important to decrease the likelihood of diabetic complications such as nephropathy, neuropathy, limb loss, blindness, coronary artery disease, and death. Intensive lifestyle modification including diet, exercise and weight loss are the first line of treatment for diabetes. Alexandra Beck agreed to continue metformin, no refill needed.  2. Depression with anxiety Behavior  modification techniques were discussed today to help Alexandra Beck deal with her emotional/non-hunger eating behaviors. We will refill Trintellix for 1 month. Orders and follow up as documented in patient record.   - vortioxetine HBr (TRINTELLIX) 20 MG TABS tablet; Take 1 tablet (20 mg total) by mouth daily.  Dispense: 30 tablet; Refill: 0  3. At risk for hyperglycemia Alexandra Beck was given approximately 15 minutes of counseling today regarding prevention of hyperglycemia. She was advised of hyperglycemia causes and the fact hyperglycemia is often asymptomatic. Alexandra Beck was instructed to avoid skipping meals, eat regular protein rich meals and schedule low calorie but protein rich snacks as needed.   Repetitive spaced learning was employed today to elicit superior memory formation and behavioral change  4. Class 3 severe obesity with serious comorbidity and body mass index (BMI) greater than or equal to 70 in adult, unspecified obesity type (Alexandra Beck) Alexandra Beck is currently in the action stage of change. As such, her goal is to continue with weight loss efforts. She has agreed to the Category 4 Plan.   Exercise goals: All adults should avoid inactivity. Some physical activity is better than none, and adults who participate in any amount of physical activity gain some health benefits.  Behavioral modification strategies: increasing lean protein intake, meal planning and cooking strategies, avoiding temptations and planning for success.  Alexandra Beck has agreed to follow-up with our clinic in 2 weeks. She was informed of the importance of frequent follow-up visits to maximize her success with intensive lifestyle modifications for her multiple health conditions.   Objective:   Blood pressure (!) 150/77, pulse 63, temperature 98.2 F (36.8 C), temperature  source Oral, height 5\' 2"  (1.575 m), weight (!) 426 lb (193.2 kg), last menstrual period 05/14/2020, SpO2 96 %. Body mass index is 77.92 kg/m.  General: Cooperative, alert,  well developed, in no acute distress. HEENT: Conjunctivae and lids unremarkable. Cardiovascular: Regular rhythm.  Lungs: Normal work of breathing. Neurologic: No focal deficits.   Lab Results  Component Value Date   CREATININE 0.99 03/26/2020   BUN 31 (H) 03/26/2020   NA 141 03/26/2020   K 4.4 03/26/2020   CL 98 03/26/2020   CO2 25 03/26/2020   Lab Results  Component Value Date   ALT 18 12/17/2019   AST 16 12/17/2019   ALKPHOS 82 12/17/2019   BILITOT 0.3 12/17/2019   Lab Results  Component Value Date   HGBA1C 5.6 12/17/2019   HGBA1C 5.6 07/04/2019   HGBA1C 5.4 12/04/2018   HGBA1C 5.8 (H) 07/25/2018   HGBA1C 6.5 03/23/2018   Lab Results  Component Value Date   INSULIN 17.1 12/17/2019   INSULIN 23.5 07/04/2019   INSULIN 25.8 (H) 12/04/2018   INSULIN 24.7 07/25/2018   INSULIN 25.0 (H) 04/17/2018   Lab Results  Component Value Date   TSH 1.980 07/04/2019   Lab Results  Component Value Date   CHOL 194 12/17/2019   HDL 47 12/17/2019   LDLCALC 106 (H) 12/17/2019   LDLDIRECT 141.0 03/02/2016   TRIG 240 (H) 12/17/2019   CHOLHDL 4 03/23/2018   Lab Results  Component Value Date   WBC 5.9 07/25/2018   HGB 14.8 07/25/2018   HCT 44.8 07/25/2018   MCV 88 07/25/2018   PLT 230.0 03/23/2018   No results found for: IRON, TIBC, FERRITIN  Attestation Statements:   Reviewed by clinician on day of visit: allergies, medications, problem list, medical history, surgical history, family history, social history, and previous encounter notes.   I, Trixie Dredge, am acting as transcriptionist for Coralie Common, MD.  I have reviewed the above documentation for accuracy and completeness, and I agree with the above. - Jinny Blossom, MD

## 2020-06-04 MED ORDER — MODAFINIL 100 MG PO TABS: ORAL_TABLET | ORAL | 3 refills | Status: DC

## 2020-06-04 NOTE — Telephone Encounter (Signed)
Dr. Elsworth Soho please advise on refill for Provigil.

## 2020-06-04 NOTE — Addendum Note (Signed)
Addended by: Jannette Spanner on: 06/04/2020 11:23 AM   Modules accepted: Orders

## 2020-06-09 ENCOUNTER — Encounter (INDEPENDENT_AMBULATORY_CARE_PROVIDER_SITE_OTHER): Payer: Self-pay | Admitting: Family Medicine

## 2020-06-11 ENCOUNTER — Ambulatory Visit (INDEPENDENT_AMBULATORY_CARE_PROVIDER_SITE_OTHER): Payer: BC Managed Care – PPO | Admitting: Family Medicine

## 2020-06-22 ENCOUNTER — Ambulatory Visit (INDEPENDENT_AMBULATORY_CARE_PROVIDER_SITE_OTHER): Payer: BC Managed Care – PPO | Admitting: Psychology

## 2020-06-22 DIAGNOSIS — F331 Major depressive disorder, recurrent, moderate: Secondary | ICD-10-CM | POA: Diagnosis not present

## 2020-07-05 ENCOUNTER — Other Ambulatory Visit: Payer: Self-pay | Admitting: Family

## 2020-07-05 ENCOUNTER — Other Ambulatory Visit (INDEPENDENT_AMBULATORY_CARE_PROVIDER_SITE_OTHER): Payer: Self-pay | Admitting: Family Medicine

## 2020-07-05 DIAGNOSIS — L03119 Cellulitis of unspecified part of limb: Secondary | ICD-10-CM

## 2020-07-05 DIAGNOSIS — F418 Other specified anxiety disorders: Secondary | ICD-10-CM

## 2020-07-06 ENCOUNTER — Ambulatory Visit (INDEPENDENT_AMBULATORY_CARE_PROVIDER_SITE_OTHER): Payer: BC Managed Care – PPO | Admitting: Psychology

## 2020-07-06 ENCOUNTER — Encounter (INDEPENDENT_AMBULATORY_CARE_PROVIDER_SITE_OTHER): Payer: Self-pay

## 2020-07-06 ENCOUNTER — Telehealth: Payer: Self-pay | Admitting: *Deleted

## 2020-07-06 DIAGNOSIS — F331 Major depressive disorder, recurrent, moderate: Secondary | ICD-10-CM

## 2020-07-06 NOTE — Telephone Encounter (Signed)
Patient requesting antibiotic refill. Per last office note patient was supposed to take 6 more months, then have an antibiotic holiday and follow up. No appointment is scheduled RN called patient, left message asking her to call back.  Patient returned call, states she is feeling anxious about stopping.  She has been off amoxicillin ~10 days.  She would like to restart them.   No fever, but does have nonspecific pain in her left calf (4/10 when it is bumped only, 0/10 when not touching anything), feels her left leg is more swollen than usual. She is still wearing the compression stockings most days, not when her leg is too swollen. RN scheduled patient for mychart video visit 8/10 3:45 with Marya Amsler. Landis Gandy, RN

## 2020-07-07 ENCOUNTER — Telehealth (INDEPENDENT_AMBULATORY_CARE_PROVIDER_SITE_OTHER): Payer: BC Managed Care – PPO | Admitting: Family

## 2020-07-07 ENCOUNTER — Encounter: Payer: Self-pay | Admitting: Family

## 2020-07-07 ENCOUNTER — Other Ambulatory Visit: Payer: Self-pay

## 2020-07-07 DIAGNOSIS — Z6841 Body Mass Index (BMI) 40.0 and over, adult: Secondary | ICD-10-CM

## 2020-07-07 DIAGNOSIS — L03119 Cellulitis of unspecified part of limb: Secondary | ICD-10-CM | POA: Diagnosis not present

## 2020-07-07 MED ORDER — AMOXICILLIN 500 MG PO CAPS: ORAL_CAPSULE | ORAL | 3 refills | Status: DC

## 2020-07-07 NOTE — Progress Notes (Signed)
Subjective:    Patient ID: Alexandra Beck, female    DOB: 10/19/1981, 39 y.o.   MRN: [id number]  Chief Complaint  Patient presents with  . Follow-up    leg swelling since saturday, pain in left leg    I connected with Alexandra Beck on 07/07/2020  at 3:49 pm by Video through [facility] and verified that I am speaking with the correct person using two identifiers.   I discussed the limitations, risks, security and privacy concerns of performing an evaluation and management service by telephone and the availability of in person appointments. I also discussed with the patient that there may be a patient responsible charge related to this service. The patient expressed understanding and agreed to proceed.  Location:  Ms. Delprado was located in her office in [place], [place] and I was located in the [place] office in [place], [place].    HPI:  Alexandra Beck is a 39 y.o. female with previous history of recurrent cellulitis who last interacted with a video visit on 12/26/2019 with continued amoxicillin for cellulitis prophylaxis presents today for routine follow-up and possible exacerbation of cellulitis.  Ms. Orzechowski has continued to take amoxicillin 500 mg twice daily since her last office visit up until about 1 week ago when she stopped taking the medication.  Approximately 4 days ago began experiencing new onset swelling of her bilateral lower extremities with pain in her left lower calf.  Has noted increased temperature over the past 24 to 48 hours with max temp of 99.8.  Leg swelling severity is enough that she is unable to wear her compression socks.  Has much anxiety regarding a potential exacerbation of cellulitis.  Continues to work with weight loss center and has lost approximately 41 pounds.  Has been seen by vein and vascular surgery with recommendation for possible ablation to help reduce risk of cellulitis in the future.   Allergies  Allergen Reactions  . Aspirin Hives  and Swelling    REACTION: throat swelling, hives  . Doxycycline Other (See Comments)    Abdominal pain  . Lisinopril Cough  . Niaspan [Niacin Er]     Caused flushing      Outpatient Medications Prior to Visit  Medication Sig Dispense Refill  . levothyroxine (SYNTHROID) 150 MCG tablet Take 1 tablet (150 mcg total) by mouth daily before breakfast. 90 tablet 3  . losartan (COZAAR) 50 MG tablet TAKE 1 TABLET BY MOUTH DAILY (Patient taking differently: 100 mg. ) 90 tablet 3  . metFORMIN (GLUCOPHAGE) 500 MG tablet TAKE 1 TABLET(500 MG) BY MOUTH DAILY 90 tablet 1  . metoprolol succinate (TOPROL-XL) 50 MG 24 hr tablet Take 1 tablet (50 mg total) by mouth daily. Take with or immediately following a meal. 90 tablet 3  . modafinil (PROVIGIL) 100 MG tablet TAKE 3 TABLETS(300 MG) BY MOUTH EVERY MORNING 90 tablet 3  . Omega-3 Fatty Acids (FISH OIL) 1000 MG CAPS Take 1 capsule (1,000 mg total) by mouth daily. (Patient taking differently: Take 1,000 mg by mouth daily. 1000 mg 3 times a week) 30 capsule 6  . rosuvastatin (CRESTOR) 10 MG tablet TAKE 1 TABLET(10 MG) BY MOUTH DAILY 90 tablet 1  . spironolactone (ALDACTONE) 25 MG tablet TAKE 1 TABLET(25 MG) BY MOUTH DAILY 90 tablet 0  . torsemide (DEMADEX) 20 MG tablet TAKE 2 TABLETS(40 MG) BY MOUTH DAILY 180 tablet 0  . traMADol (ULTRAM) 50 MG tablet Take 1-2 tablets (50-100 mg total) by mouth every  8 (eight) hours as needed. 20 tablet 0  . TRINTELLIX 20 MG TABS tablet TAKE 1 TABLET(20 MG) BY MOUTH DAILY 30 tablet 0  . Vitamin D, Ergocalciferol, (DRISDOL) 1.25 MG (50000 UNIT) CAPS capsule Take 1 capsule (50,000 Units total) by mouth every 7 (seven) days. 4 capsule 0  . amoxicillin (AMOXIL) 500 MG capsule TAKE 1 CAPSULE(500 MG) BY MOUTH TWICE DAILY (Patient not taking: Reported on 07/07/2020) 60 capsule 3   No facility-administered medications prior to visit.     Past Medical History:  Diagnosis Date  . Acute renal failure (ARF) ([place]) 11/2012     multifactorial-likely secondary to ATN in the setting of sepsis and hypotension, and also from rhabdomyolysis  . Anemia   . Cellulitis   . Chronic diastolic CHF (congestive heart failure) ([place])   . Depression   . Diabetes mellitus ([place])   . GERD (gastroesophageal reflux disease)   . Hyperlipidemia   . Hypertension   . Hypothyroidism   . Morbid obesity with BMI of 70 and over, adult ([place])   . Obesity hypoventilation syndrome ([place])   . OSA (obstructive sleep apnea)   . PVC's (premature ventricular contractions)   . Recurrent cellulitis of lower leg    LLE, venous insuff  . Sepsis ([place]) 11/2012   Secondary to cellulitis  . Sleep apnea   . Venous insufficiency of leg      Past Surgical History:  Procedure Laterality Date  . IR FLUORO GUIDE CV MIDLINE PICC RIGHT  03/28/2017  . IR US GUIDE VASC ACCESS RIGHT  03/28/2017  . WISDOM TOOTH EXTRACTION          Review of Systems  Constitutional: Negative for chills, diaphoresis, fatigue and fever.  Respiratory: Negative for cough, chest tightness, shortness of breath and wheezing.   Cardiovascular: Positive for leg swelling. Negative for chest pain.  Gastrointestinal: Negative for abdominal pain, diarrhea, nausea and vomiting.      Objective:      Ms. Poinsett is pleasant to speak with and has increased level of anxiety related to possibility of cellulitis and possible flare of symptoms. Physical exam is otherwise limited secondary to telemedicine visit.   Depression screen [facility] 2/9 04/17/2018 02/27/2018 10/11/2017  Decreased Interest 3 0 0  Down, Depressed, Hopeless 3 0 0  PHQ - 2 Score 6 0 0  Altered sleeping 3 - -  Tired, decreased energy 3 - -  Change in appetite 2 - -  Feeling bad or failure about yourself  3 - -  Trouble concentrating 1 - -  Moving slowly or fidgety/restless 3 - -  Suicidal thoughts 2 - -  PHQ-9 Score 23 - -  Difficult doing work/chores Very difficult - -  Some recent data might be hidden       Assessment &  Plan:    Patient Active Problem List   Diagnosis Date Noted  . Psoriasis 12/26/2019  . 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 Hypersomnolence 05/11/2018  . Vitamin D deficiency 05/02/2018  . Cellulitis of left leg 04/17/2017  . Cellulitis of right leg 01/11/2017  . PVC's (premature ventricular contractions) 12/02/2016  . Chronic diastolic CHF (congestive heart failure) ([place])   . Acute kidney injury ([place])   . Hyperlipidemia   . Diabetes mellitus type 2 in obese ([place])   . Hypothyroidism   . GERD (gastroesophageal reflux disease)   . Depression   . Venous insufficiency of leg   . Recurrent cellulitis  of lower leg 12/25/2012  . OSA (obstructive sleep apnea) 12/25/2012  . Obesity hypoventilation syndrome ([place]) 12/25/2012  . Morbid obesity with BMI of 70 and over, adult ([place]) 12/24/2012  . Essential hypertension 06/26/2007     Problem List Items Addressed This Visit      Other   Morbid obesity with BMI of 70 and over, adult [facility])    Ms. Marcott has lost a total of 41 pounds since starting to work with [facility] and notes weight is fluctuating up/down about 15 pounds. Agree with continued weight loss that this will reduce likelihood of cellulitis recurrence. Continue to work with [facility].       Recurrent cellulitis of lower leg - Primary    Ms. Rembert has increased edema of her bilateral lower extremities likely contributing to her current symptoms. Unlikely related to cellulitis although possibly early start. Will restart Amoxicillin for prophylaxis. If cellulitis worsens plan would be to increase amoxicillin to 4 times daily for 5 days and then return to 500 mg twice daily. Discussed importance of reducing fluid in legs which increases her risk for cellulitis. Plan for follow up in 3 months or sooner if needed.       Relevant Medications   amoxicillin (AMOXIL) 500 MG capsule       I am having Alexandra Beck maintain her levothyroxine, traMADol, metoprolol succinate, Fish Oil, losartan, metFORMIN, Vitamin D (Ergocalciferol), rosuvastatin, spironolactone, torsemide, modafinil, Trintellix, and amoxicillin.   Meds ordered this encounter  Medications  . amoxicillin (AMOXIL) 500 MG capsule    Sig: TAKE 1 CAPSULE(500 MG) BY MOUTH TWICE DAILY    Dispense:  60 capsule    Refill:  3    Order Specific Question:   Supervising Provider    Answer:   Carlyle Basques [[id number]]     Follow-up: Return in about 3 months (around 10/07/2020), or if symptoms worsen or fail to improve.   Terri Piedra, MSN, FNP-C Nurse Practitioner [facility] [place] number: [phone]

## 2020-07-07 NOTE — Patient Instructions (Signed)
Nice to see you.  We will refill your amoxicillin for 500 mg twice daily.  Continue to work on fluid management and weight loss.   Elevate your legs as able and tolerated.   Plan for follow up in 3 months or sooner if needed.   Have a great day and stay safe!

## 2020-07-07 NOTE — Assessment & Plan Note (Signed)
Ms. Raj has increased edema of her bilateral lower extremities likely contributing to her current symptoms. Unlikely related to cellulitis although possibly early start. Will restart Amoxicillin for prophylaxis. If cellulitis worsens plan would be to increase amoxicillin to 4 times daily for 5 days and then return to 500 mg twice daily. Discussed importance of reducing fluid in legs which increases her risk for cellulitis. Plan for follow up in 3 months or sooner if needed.

## 2020-07-07 NOTE — Assessment & Plan Note (Signed)
Alexandra Beck has lost a total of 41 pounds since starting to work with [facility] and notes weight is fluctuating up/down about 15 pounds. Agree with continued weight loss that this will reduce likelihood of cellulitis recurrence. Continue to work with [facility].

## 2020-07-08 ENCOUNTER — Other Ambulatory Visit: Payer: Self-pay | Admitting: Family

## 2020-07-08 DIAGNOSIS — E039 Hypothyroidism, unspecified: Secondary | ICD-10-CM

## 2020-07-16 ENCOUNTER — Encounter (INDEPENDENT_AMBULATORY_CARE_PROVIDER_SITE_OTHER): Payer: Self-pay | Admitting: Family Medicine

## 2020-07-16 ENCOUNTER — Ambulatory Visit (INDEPENDENT_AMBULATORY_CARE_PROVIDER_SITE_OTHER): Payer: BC Managed Care – PPO | Admitting: Family Medicine

## 2020-07-16 ENCOUNTER — Encounter (INDEPENDENT_AMBULATORY_CARE_PROVIDER_SITE_OTHER): Payer: Self-pay

## 2020-07-16 ENCOUNTER — Other Ambulatory Visit: Payer: Self-pay

## 2020-07-16 VITALS — BP 130/73 | HR 64 | Temp 98.6°F | Ht 62.0 in | Wt >= 6400 oz

## 2020-07-16 DIAGNOSIS — F3289 Other specified depressive episodes: Secondary | ICD-10-CM | POA: Diagnosis not present

## 2020-07-16 DIAGNOSIS — R600 Localized edema: Secondary | ICD-10-CM | POA: Diagnosis not present

## 2020-07-16 DIAGNOSIS — E1165 Type 2 diabetes mellitus with hyperglycemia: Secondary | ICD-10-CM

## 2020-07-16 DIAGNOSIS — Z9189 Other specified personal risk factors, not elsewhere classified: Secondary | ICD-10-CM

## 2020-07-16 DIAGNOSIS — Z6841 Body Mass Index (BMI) 40.0 and over, adult: Secondary | ICD-10-CM

## 2020-07-16 DIAGNOSIS — E559 Vitamin D deficiency, unspecified: Secondary | ICD-10-CM

## 2020-07-16 MED ORDER — RYBELSUS 3 MG PO TABS: 3.0000 mg | ORAL_TABLET | Freq: Every morning | ORAL | 0 refills | Status: DC

## 2020-07-16 MED ORDER — VITAMIN D (ERGOCALCIFEROL) 1.25 MG (50000 UNIT) PO CAPS: 50000.0000 [IU] | ORAL_CAPSULE | ORAL | 0 refills | Status: DC

## 2020-07-16 NOTE — Progress Notes (Signed)
Chief Complaint:   OBESITY Alexandra Beck is here to discuss her progress with her obesity treatment plan along with follow-up of her obesity related diagnoses. Alexandra Beck is on the Category 4 Plan and states she is following her eating plan approximately 50% of the time. Alexandra Beck states she is walking for 20 minutes 3 times per week.  Today's visit was #: 66 Starting weight: 468 lbs Starting date: 04/17/2018 Today's weight: 433 lbs Today's date: 07/16/2020 Total lbs lost to date: 35 Total lbs lost since last in-office visit: 0  Interim History: Alexandra Beck has gone back into the building for work so her schedule has been in flux. She has been trying to stay on the plan. Her schedule has been changing so much that she is trying to get at least 2 meals in daily. She is occasionally indulging in eating out secondary to stress.  Subjective:   1. Vitamin D deficiency Alexandra Beck denies nausea, vomiting, or muscle weakness, but she notes fatigue. She is on prescription Vit D (last Vit D level was 38.2).  2. Type 2 diabetes mellitus with hyperglycemia, without long-term current use of insulin (HCC) Alexandra Beck is still experiencing carbohydrate cravings. She is on metformin only.  3. Other depression,with emotional eating  Alexandra Beck is feeling apathy, anhedonia, and generalized sadness, but she denies suicidal ideas or homicidal ideas. She was previously on Zoloft with sexual side effects. She is on Alexandra Beck with minimal improvement.  4. Edema of lower extremity Alexandra Beck notes tenderness of posterior lower leg. She notes stasis changes, but denies warmth.  5. At risk for osteoporosis Alexandra Beck is at higher risk of osteopenia and osteoporosis due to Vitamin D deficiency.   Assessment/Plan:   1. Vitamin D deficiency Low Vitamin D level contributes to fatigue and are associated with obesity, breast, and colon cancer. We will refill prescription Vitamin D for 1 month. Alexandra Beck will follow-up for routine testing of Vitamin D, at  least 2-3 times per year to avoid over-replacement.  - Vitamin D, Ergocalciferol, (DRISDOL) 1.25 MG (50000 UNIT) CAPS capsule; Take 1 capsule (50,000 Units total) by mouth every 7 (seven) days.  Dispense: 4 capsule; Refill: 0  2. Type 2 diabetes mellitus with hyperglycemia, without long-term current use of insulin (HCC) Good blood sugar control is important to decrease the likelihood of diabetic complications such as nephropathy, neuropathy, limb loss, blindness, coronary artery disease, and death. Intensive lifestyle modification including diet, exercise and weight loss are the first line of treatment for diabetes. Alexandra Beck agreed to start Rybelsus 3 mg PO q AM with no refills.  - Semaglutide (RYBELSUS) 3 MG TABS; Take 3 mg by mouth in the morning.  Dispense: 30 tablet; Refill: 0  3. Other depression,with emotional eating  Behavior modification techniques were discussed today to help Alexandra Beck deal with her emotional/non-hunger eating behaviors. We will refer to Alexandra Beck for Psychiatry evaluation. Orders and follow up as documented in patient record.   4. Edema of lower extremity We will refer the patient to have an ultrasound venous lower left extremity. Alexandra Beck will follow up as directed.  5. At risk for osteoporosis Alexandra Beck was given approximately 15 minutes of osteoporosis prevention counseling today. Alexandra Beck is at risk for osteopenia and osteoporosis due to her Vitamin D deficiency. She was encouraged to take her Vitamin D and follow her higher calcium diet and increase strengthening exercise to help strengthen her bones and decrease her risk of osteopenia and osteoporosis.  Repetitive spaced learning was employed today to elicit superior memory  formation and behavioral change.  6. Class 3 severe obesity with serious comorbidity and body mass index (BMI) greater than or equal to 70 in adult, unspecified obesity type ([place]) Alexandra Beck is currently in the action stage of change. As such, her goal is to  continue with weight loss efforts. She has agreed to the Category 4 Plan.   Exercise goals: No exercise has been prescribed at this time.  Behavioral modification strategies: increasing lean protein intake, meal planning and cooking strategies, keeping healthy foods in the home and planning for success.  Alexandra Beck has agreed to follow-up with our clinic in 2 weeks. She was informed of the importance of frequent follow-up visits to maximize her success with intensive lifestyle modifications for her multiple health conditions.   Objective:   Blood pressure 130/73, pulse 64, temperature 98.6 F (37 C), temperature source Oral, height 5\' 2"  (1.575 m), weight (!) 433 lb (196.4 kg), last menstrual period 07/13/2020, SpO2 97 %. Body mass index is 79.2 kg/m.  General: Cooperative, alert, well developed, in no acute distress. HEENT: Conjunctivae and lids unremarkable. Cardiovascular: Regular rhythm.  Lungs: Normal work of breathing. Neurologic: No focal deficits.   Lab Results  Component Value Date   CREATININE 0.99 03/26/2020   BUN 31 (H) 03/26/2020   NA 141 03/26/2020   K 4.4 03/26/2020   CL 98 03/26/2020   CO2 25 03/26/2020   Lab Results  Component Value Date   ALT 18 12/17/2019   AST 16 12/17/2019   ALKPHOS 82 12/17/2019   BILITOT 0.3 12/17/2019   Lab Results  Component Value Date   HGBA1C 5.6 12/17/2019   HGBA1C 5.6 07/04/2019   HGBA1C 5.4 12/04/2018   HGBA1C 5.8 (H) 07/25/2018   HGBA1C 6.5 03/23/2018   Lab Results  Component Value Date   INSULIN 17.1 12/17/2019   INSULIN 23.5 07/04/2019   INSULIN 25.8 (H) 12/04/2018   INSULIN 24.7 07/25/2018   INSULIN 25.0 (H) 04/17/2018   Lab Results  Component Value Date   TSH 1.980 07/04/2019   Lab Results  Component Value Date   CHOL 194 12/17/2019   HDL 47 12/17/2019   LDLCALC 106 (H) 12/17/2019   LDLDIRECT 141.0 03/02/2016   TRIG 240 (H) 12/17/2019   CHOLHDL 4 03/23/2018   Lab Results  Component Value Date   WBC 5.9  07/25/2018   HGB 14.8 07/25/2018   HCT 44.8 07/25/2018   MCV 88 07/25/2018   PLT 230.0 03/23/2018   No results found for: IRON, TIBC, FERRITIN  Attestation Statements:   Reviewed by clinician on day of visit: allergies, medications, problem list, medical history, surgical history, family history, social history, and previous encounter notes.   I, Trixie Dredge, am acting as transcriptionist for Coralie Common, MD.  I have reviewed the above documentation for accuracy and completeness, and I agree with the above. - Jinny Blossom, MD

## 2020-07-20 ENCOUNTER — Ambulatory Visit (INDEPENDENT_AMBULATORY_CARE_PROVIDER_SITE_OTHER): Payer: BC Managed Care – PPO | Admitting: Psychology

## 2020-07-20 DIAGNOSIS — F331 Major depressive disorder, recurrent, moderate: Secondary | ICD-10-CM

## 2020-07-22 ENCOUNTER — Other Ambulatory Visit: Payer: Self-pay

## 2020-07-22 ENCOUNTER — Ambulatory Visit (HOSPITAL_COMMUNITY)
Admission: RE | Admit: 2020-07-22 | Discharge: 2020-07-22 | Disposition: A | Discharge status: Home / Self Care | Payer: BC Managed Care – PPO | Source: Ambulatory Visit | Attending: Family Medicine | Admitting: Family Medicine

## 2020-07-22 DIAGNOSIS — R600 Localized edema: Secondary | ICD-10-CM | POA: Diagnosis not present

## 2020-07-22 NOTE — Progress Notes (Signed)
Lower extremity venous LT study completed  Preliminary results relayed to [facility].  See CV Proc for preliminary results report.   Alexandra Beck

## 2020-07-30 ENCOUNTER — Other Ambulatory Visit: Payer: Self-pay

## 2020-07-30 ENCOUNTER — Ambulatory Visit (INDEPENDENT_AMBULATORY_CARE_PROVIDER_SITE_OTHER): Payer: BC Managed Care – PPO | Admitting: Family Medicine

## 2020-07-30 ENCOUNTER — Encounter (INDEPENDENT_AMBULATORY_CARE_PROVIDER_SITE_OTHER): Payer: Self-pay | Admitting: Family Medicine

## 2020-07-30 VITALS — BP 127/63 | HR 64 | Temp 98.1°F | Ht 62.0 in | Wt >= 6400 oz

## 2020-07-30 DIAGNOSIS — Z9189 Other specified personal risk factors, not elsewhere classified: Secondary | ICD-10-CM

## 2020-07-30 DIAGNOSIS — E1165 Type 2 diabetes mellitus with hyperglycemia: Secondary | ICD-10-CM | POA: Diagnosis not present

## 2020-07-30 DIAGNOSIS — E559 Vitamin D deficiency, unspecified: Secondary | ICD-10-CM

## 2020-07-30 DIAGNOSIS — F418 Other specified anxiety disorders: Secondary | ICD-10-CM | POA: Diagnosis not present

## 2020-07-30 DIAGNOSIS — Z6841 Body Mass Index (BMI) 40.0 and over, adult: Secondary | ICD-10-CM

## 2020-07-30 MED ORDER — VITAMIN D (ERGOCALCIFEROL) 1.25 MG (50000 UNIT) PO CAPS: 50000.0000 [IU] | ORAL_CAPSULE | ORAL | 0 refills | Status: DC

## 2020-07-30 MED ORDER — VORTIOXETINE HBR 20 MG PO TABS: 20.0000 mg | ORAL_TABLET | Freq: Every day | ORAL | 0 refills | Status: DC

## 2020-08-04 NOTE — Progress Notes (Signed)
Chief Complaint:   OBESITY Alexandra Beck is here to discuss her progress with her obesity treatment plan along with follow-up of her obesity related diagnoses. Alexandra Beck is on the Category 4 Plan and states she is following her eating plan approximately 80% of the time. Jameson states she is walking for 20 minutes 3 times per week.  Today's visit was #: 2 Starting weight: 468 lbs Starting date: 04/17/2018 Today's weight: 429 lbs Today's date: 07/30/2020 Total lbs lost to date: 39 Total lbs lost since last in-office visit: 4  Interim History: Alexandra Beck has had a reasonably good few weeks on the meal plan. She is now working 3 days in the building and 2 days at home. Her work schedule changes daily and she is trying to plan meals around when she will be home versus work.  Subjective:   1. Type 2 diabetes mellitus with hyperglycemia, without long-term current use of insulin (HCC) [name] started Rybelsus 3 mg 10 days ago. She denies feelings of hypoglycemia.  2. Vitamin D deficiency Alexandra Beck denies nausea, vomiting, or muscle weakness, but notes fatigue. She is on prescription Vit D. Last Vit D labs was done in January 2021.  3. Depression with anxiety Alexandra Beck denies suicidal ideas or homicidal ideas. She is on Trintellix with minimal improvement of mood.  4. At risk for osteoporosis Alexandra Beck is at higher risk of osteopenia and osteoporosis due to Vitamin D deficiency.   Assessment/Plan:   1. Type 2 diabetes mellitus with hyperglycemia, without long-term current use of insulin (HCC) Good blood sugar control is important to decrease the likelihood of diabetic complications such as nephropathy, neuropathy, limb loss, blindness, coronary artery disease, and death. Intensive lifestyle modification including diet, exercise and weight loss are the first line of treatment for diabetes. Hilarie will continue Rybelsus and will continue to follow up as directed.   2. Vitamin D deficiency Low Vitamin D level  contributes to fatigue and are associated with obesity, breast, and colon cancer. We will refill prescription Vitamin D for 1 month. [name] will follow-up for routine testing of Vitamin D, at least 2-3 times per year to avoid over-replacement.  - Vitamin D, Ergocalciferol, (DRISDOL) 1.25 MG (50000 UNIT) CAPS capsule; Take 1 capsule (50,000 Units total) by mouth every 7 (seven) days.  Dispense: 4 capsule; Refill: 0  3. Depression with anxiety Behavior modification techniques were discussed today to help Avanti deal with her emotional/non-hunger eating behaviors. We will refill Trintellix for 1 month. Orders and follow up as documented in patient record.   - vortioxetine HBr (TRINTELLIX) 20 MG TABS tablet; Take 1 tablet (20 mg total) by mouth daily.  Dispense: 30 tablet; Refill: 0  4. At risk for osteoporosis Alexandra Beck was given approximately 15 minutes of osteoporosis prevention counseling today. Alexandra Beck is at risk for osteopenia and osteoporosis due to her Vitamin D deficiency. She was encouraged to take her Vitamin D and follow her higher calcium diet and increase strengthening exercise to help strengthen her bones and decrease her risk of osteopenia and osteoporosis.  Repetitive spaced learning was employed today to elicit superior memory formation and behavioral change.  5. Class 3 severe obesity with serious comorbidity and body mass index (BMI) greater than or equal to 70 in adult, unspecified obesity type ([place]) Alexandra Beck is currently in the action stage of change. As such, her goal is to continue with weight loss efforts. She has agreed to the Category 4 Plan.   Exercise goals: All adults should avoid inactivity. Some  physical activity is better than none, and adults who participate in any amount of physical activity gain some health benefits.  Behavioral modification strategies: increasing lean protein intake, increasing vegetables, meal planning and cooking strategies, keeping healthy foods in the  home and planning for success.  Alexandra Beck has agreed to follow-up with our clinic in 2 weeks. She was informed of the importance of frequent follow-up visits to maximize her success with intensive lifestyle modifications for her multiple health conditions.   Objective:   Pulse 64, temperature 98.1 F (36.7 C), temperature source Oral, last menstrual period 07/13/2020, SpO2 95 %. There is no height or weight on file to calculate BMI.  General: Cooperative, alert, well developed, in no acute distress. HEENT: Conjunctivae and lids unremarkable. Cardiovascular: Regular rhythm.  Lungs: Normal work of breathing. Neurologic: No focal deficits.   Lab Results  Component Value Date   CREATININE 0.99 03/26/2020   BUN 31 (H) 03/26/2020   NA 141 03/26/2020   K 4.4 03/26/2020   CL 98 03/26/2020   CO2 25 03/26/2020   Lab Results  Component Value Date   ALT 18 12/17/2019   AST 16 12/17/2019   ALKPHOS 82 12/17/2019   BILITOT 0.3 12/17/2019   Lab Results  Component Value Date   HGBA1C 5.6 12/17/2019   HGBA1C 5.6 07/04/2019   HGBA1C 5.4 12/04/2018   HGBA1C 5.8 (H) 07/25/2018   HGBA1C 6.5 03/23/2018   Lab Results  Component Value Date   INSULIN 17.1 12/17/2019   INSULIN 23.5 07/04/2019   INSULIN 25.8 (H) 12/04/2018   INSULIN 24.7 07/25/2018   INSULIN 25.0 (H) 04/17/2018   Lab Results  Component Value Date   TSH 1.980 07/04/2019   Lab Results  Component Value Date   CHOL 194 12/17/2019   HDL 47 12/17/2019   LDLCALC 106 (H) 12/17/2019   LDLDIRECT 141.0 03/02/2016   TRIG 240 (H) 12/17/2019   CHOLHDL 4 03/23/2018   Lab Results  Component Value Date   WBC 5.9 07/25/2018   HGB 14.8 07/25/2018   HCT 44.8 07/25/2018   MCV 88 07/25/2018   PLT 230.0 03/23/2018   No results found for: IRON, TIBC, FERRITIN  Attestation Statements:   Reviewed by clinician on day of visit: allergies, medications, problem list, medical history, surgical history, family history, social history, and  previous encounter notes.   I, Trixie Dredge, am acting as transcriptionist for Coralie Common, MD.  I have reviewed the above documentation for accuracy and completeness, and I agree with the above. - Jinny Blossom, MD

## 2020-08-17 ENCOUNTER — Other Ambulatory Visit: Payer: Self-pay | Admitting: Family

## 2020-08-18 ENCOUNTER — Other Ambulatory Visit: Payer: Self-pay | Admitting: Family

## 2020-08-18 ENCOUNTER — Telehealth: Payer: Self-pay | Admitting: Pulmonary Disease

## 2020-08-18 NOTE — Telephone Encounter (Signed)
PA request received from [facility] requested: Modafinil 100mg   CMM Key: [id number] Tried/failed: on file Covered alternatives:  PA request has been sent to plan, and a determination is expected within 1 days.   Routing to Paulina  for follow-up.

## 2020-08-20 ENCOUNTER — Other Ambulatory Visit: Payer: Self-pay

## 2020-08-20 ENCOUNTER — Encounter (INDEPENDENT_AMBULATORY_CARE_PROVIDER_SITE_OTHER): Payer: Self-pay | Admitting: Family Medicine

## 2020-08-20 ENCOUNTER — Ambulatory Visit (INDEPENDENT_AMBULATORY_CARE_PROVIDER_SITE_OTHER): Payer: BC Managed Care – PPO | Admitting: Family Medicine

## 2020-08-20 VITALS — BP 160/52 | HR 80 | Temp 98.3°F | Ht 62.0 in | Wt >= 6400 oz

## 2020-08-20 DIAGNOSIS — E1165 Type 2 diabetes mellitus with hyperglycemia: Secondary | ICD-10-CM

## 2020-08-20 DIAGNOSIS — Z6841 Body Mass Index (BMI) 40.0 and over, adult: Secondary | ICD-10-CM

## 2020-08-20 DIAGNOSIS — F418 Other specified anxiety disorders: Secondary | ICD-10-CM

## 2020-08-20 DIAGNOSIS — Z9189 Other specified personal risk factors, not elsewhere classified: Secondary | ICD-10-CM | POA: Diagnosis not present

## 2020-08-20 DIAGNOSIS — I5032 Chronic diastolic (congestive) heart failure: Secondary | ICD-10-CM | POA: Diagnosis not present

## 2020-08-20 MED ORDER — RYBELSUS 7 MG PO TABS: 7.0000 mg | ORAL_TABLET | Freq: Every morning | ORAL | 0 refills | Status: DC

## 2020-08-21 NOTE — Progress Notes (Signed)
Chief Complaint:   OBESITY Alexandra Beck is here to discuss her progress with her obesity treatment plan along with follow-up of her obesity related diagnoses. [name] is on the Category 4 Plan and states she is following her eating plan approximately 70% of the time. [name] states she is doing 0 minutes 0 times per week.  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 She voices she is at an appointment because she feels she needs to. She doesn't want to give up but struggling with motivation and compliance. She voices she feels she has to start at the very beginning.  Subjective:   1. Type 2 diabetes mellitus with hyperglycemia, without long-term current use of insulin (HCC) Alexandra Beck is on Trintellix but no real improvement in symptoms (previously is on Zoloft with sexual side effects).  2. Chronic diastolic CHF (congestive heart failure) (HCC) [name] sees Dr. Meda Coffee. She has missed medication a few days so does voice she's retaining some fluid (did miss medications yesterday).  3. Depression with anxiety Alexandra Beck started on 3 mg of Rybelsus with no GI side effects. She is not checking her BGs at home.  4. At risk for depression Alexandra Beck is at elevated risk of depression due to increase of life stressors.  Assessment/Plan:   1. Type 2 diabetes mellitus with hyperglycemia, without long-term current use of insulin (HCC) Good blood sugar control is important to decrease the likelihood of diabetic complications such as nephropathy, neuropathy, limb loss, blindness, coronary artery disease, and death. Intensive lifestyle modification including diet, exercise and weight loss are the first line of treatment for diabetes. [name] agreed to increase Rybelsus to 7 mg PO q  AM with no refill.  - Semaglutide (RYBELSUS) 7 MG TABS; Take 7 mg by mouth in the morning.  Dispense: 30 tablet; Refill: 0  2. Chronic diastolic CHF (congestive heart failure) (HCC) [name] will follow up with Cardiology, and we will follow up on her blood pressure at her next appointment. Medication compliance was encouraged.  3. Depression with anxiety Behavior modification techniques were discussed today to help [name] deal with her anxiety and depression. Gracious is to reach out to schedule an appointment with Psychologist/counselor. She was referred to Psychiatry, but still has not heard from them. Orders and follow up as documented in patient record.   4. At risk for depression Alexandra Beck was given approximately 15 minutes of depression risk counseling today. She has risk factors for depression including anxiety. We discussed the importance of a healthy work life balance, a healthy relationship with food and a good support system.  Repetitive spaced learning was employed today to elicit superior memory formation and behavioral change.  5. Class 3 severe obesity with serious comorbidity and body mass index (BMI) greater than or equal to 70 in adult, unspecified obesity type ([name]) Alexandra Beck is currently in the action stage of change. As such, her goal is to continue with weight loss efforts. She has agreed to practicing portion control and making smarter food choices, such as increasing vegetables and decreasing simple carbohydrates.   We will repeat IC at her next appointment.  Exercise goals: No exercise has been prescribed at this time.  Behavioral modification strategies: increasing lean protein intake, increasing vegetables, meal planning and cooking  strategies and planning for success.  Alexandra Beck has agreed to follow-up with our clinic in 2 weeks. She was informed of the importance of frequent follow-up visits to maximize her success with intensive lifestyle modifications for her multiple health  conditions.   Objective:   Blood pressure (!) 160/52, pulse 80, temperature 98.3 F (36.8 C), temperature source Oral, height 5\' 2"  (1.575 m), weight (!) 439 lb (199.1 kg), SpO2 92 %. Body mass index is 80.29 kg/m.  General: Cooperative, alert, well developed, in no acute distress. HEENT: Conjunctivae and lids unremarkable. Cardiovascular: Regular rhythm.  Lungs: Normal work of breathing. Neurologic: No focal deficits.   Lab Results  Component Value Date   CREATININE 0.99 03/26/2020   BUN 31 (H) 03/26/2020   NA 141 03/26/2020   K 4.4 03/26/2020   CL 98 03/26/2020   CO2 25 03/26/2020   Lab Results  Component Value Date   ALT 18 12/17/2019   AST 16 12/17/2019   ALKPHOS 82 12/17/2019   BILITOT 0.3 12/17/2019   Lab Results  Component Value Date   HGBA1C 5.6 12/17/2019   HGBA1C 5.6 07/04/2019   HGBA1C 5.4 12/04/2018   HGBA1C 5.8 (H) 07/25/2018   HGBA1C 6.5 03/23/2018   Lab Results  Component Value Date   INSULIN 17.1 12/17/2019   INSULIN 23.5 07/04/2019   INSULIN 25.8 (H) 12/04/2018   INSULIN 24.7 07/25/2018   INSULIN 25.0 (H) 04/17/2018   Lab Results  Component Value Date   TSH 1.980 07/04/2019   Lab Results  Component Value Date   CHOL 194 12/17/2019   HDL 47 12/17/2019   LDLCALC 106 (H) 12/17/2019   LDLDIRECT 141.0 03/02/2016   TRIG 240 (H) 12/17/2019   CHOLHDL 4 03/23/2018   Lab Results  Component Value Date   WBC 5.9 07/25/2018   HGB 14.8 07/25/2018   HCT 44.8 07/25/2018   MCV 88 07/25/2018   PLT 230.0 03/23/2018   No results found for: IRON, TIBC, FERRITIN  Attestation Statements:   Reviewed by clinician on day of visit: allergies, medications, problem list, medical history, surgical history, family history, social history, and previous encounter notes.   I, Trixie Dredge, am acting as transcriptionist for Coralie Common, MD.  I have reviewed the above documentation for accuracy and completeness, and I agree with the above. - Jinny Blossom, MD

## 2020-08-26 ENCOUNTER — Encounter (INDEPENDENT_AMBULATORY_CARE_PROVIDER_SITE_OTHER): Payer: Self-pay

## 2020-08-28 NOTE — Telephone Encounter (Signed)
Checked status of PA< denied.  Copied from denial letter:  "Current plan approved criteria covers 60 tablets per month of the requested drug. Yourrequest for additional quanties of this drug has been denied. Your use of this drug doesnot meet the requirement."  Patient takes 3-100mg  tabs daily for a total of 300mg .  We can either appeal this decision by trying to change patient's dosage to a 100mg  and 200mg  tab daily to maintain current dosage while remaining in 60 tabs/month criteria set by insurance, or by sending an appeal letter to: [facility] / [address], [place] [address] Fax: [phone]  RA please advise on how you'd like to proceed.  Thanks!

## 2020-08-31 NOTE — Telephone Encounter (Signed)
Dr. Elsworth Soho, please advise if you are going to send Rx to pharmacy for pt.

## 2020-08-31 NOTE — Telephone Encounter (Signed)
Please change to -200 mg tablets daily in the morning and additional 100 mg in the afternoon  -This should allow her to stay within range.  Submit another PA if necessary with this dosage

## 2020-09-02 ENCOUNTER — Encounter (INDEPENDENT_AMBULATORY_CARE_PROVIDER_SITE_OTHER): Payer: Self-pay | Admitting: Family Medicine

## 2020-09-02 MED ORDER — MODAFINIL 200 MG PO TABS: 200.0000 mg | ORAL_TABLET | Freq: Every day | ORAL | 3 refills | Status: DC

## 2020-09-02 MED ORDER — MODAFINIL 100 MG PO TABS
ORAL_TABLET | ORAL | 5 refills | Status: DC
Start: 2020-09-02 — End: 2021-02-26

## 2020-09-02 NOTE — Telephone Encounter (Signed)
meds have been pended that need to be sent to pharmacy by Dr. Elsworth Soho.

## 2020-09-02 NOTE — Telephone Encounter (Signed)
ATC Patient to update her on Modafinil PA.

## 2020-09-02 NOTE — Telephone Encounter (Signed)
Prescription was authorized

## 2020-09-03 ENCOUNTER — Ambulatory Visit (INDEPENDENT_AMBULATORY_CARE_PROVIDER_SITE_OTHER): Payer: BC Managed Care – PPO | Admitting: Family Medicine

## 2020-09-13 ENCOUNTER — Other Ambulatory Visit (INDEPENDENT_AMBULATORY_CARE_PROVIDER_SITE_OTHER): Payer: Self-pay | Admitting: Family Medicine

## 2020-09-13 ENCOUNTER — Other Ambulatory Visit: Payer: Self-pay | Admitting: Cardiology

## 2020-09-13 DIAGNOSIS — F418 Other specified anxiety disorders: Secondary | ICD-10-CM

## 2020-09-21 NOTE — Telephone Encounter (Signed)
This issue has already been resolved, please see previous phone note 9/21

## 2020-09-21 NOTE — Telephone Encounter (Addendum)
Called and spoke with the pt  She states she just now received a letter that there was an issue with her medication  I called [facility] and spoke with the pharmacist  They never received the rxs that RA sent on 09/02/20  I gave verbal for these and they went through with no PA needed  Emailed pt back to let her know

## 2020-09-21 NOTE — Telephone Encounter (Signed)
Please see patient comment from mychart and respond regarding mediation.  Thank you.

## 2020-10-02 ENCOUNTER — Encounter (INDEPENDENT_AMBULATORY_CARE_PROVIDER_SITE_OTHER): Payer: Self-pay | Admitting: Family Medicine

## 2020-10-02 ENCOUNTER — Ambulatory Visit (INDEPENDENT_AMBULATORY_CARE_PROVIDER_SITE_OTHER): Payer: BC Managed Care – PPO | Admitting: Psychology

## 2020-10-02 DIAGNOSIS — F331 Major depressive disorder, recurrent, moderate: Secondary | ICD-10-CM | POA: Diagnosis not present

## 2020-10-05 NOTE — Telephone Encounter (Signed)
Dr Jeani Sow pt

## 2020-10-06 ENCOUNTER — Ambulatory Visit (INDEPENDENT_AMBULATORY_CARE_PROVIDER_SITE_OTHER): Payer: BC Managed Care – PPO | Admitting: Psychology

## 2020-10-06 DIAGNOSIS — F331 Major depressive disorder, recurrent, moderate: Secondary | ICD-10-CM | POA: Diagnosis not present

## 2020-10-14 ENCOUNTER — Encounter (INDEPENDENT_AMBULATORY_CARE_PROVIDER_SITE_OTHER): Payer: Self-pay | Admitting: Family Medicine

## 2020-10-14 ENCOUNTER — Other Ambulatory Visit: Payer: Self-pay

## 2020-10-14 ENCOUNTER — Ambulatory Visit (INDEPENDENT_AMBULATORY_CARE_PROVIDER_SITE_OTHER): Payer: BC Managed Care – PPO | Admitting: Family Medicine

## 2020-10-14 VITALS — BP 127/58 | HR 67 | Temp 98.0°F | Ht 62.0 in | Wt >= 6400 oz

## 2020-10-14 DIAGNOSIS — E1169 Type 2 diabetes mellitus with other specified complication: Secondary | ICD-10-CM

## 2020-10-14 DIAGNOSIS — I5032 Chronic diastolic (congestive) heart failure: Secondary | ICD-10-CM

## 2020-10-14 DIAGNOSIS — Z9189 Other specified personal risk factors, not elsewhere classified: Secondary | ICD-10-CM | POA: Diagnosis not present

## 2020-10-14 DIAGNOSIS — R7303 Prediabetes: Secondary | ICD-10-CM

## 2020-10-14 DIAGNOSIS — F418 Other specified anxiety disorders: Secondary | ICD-10-CM

## 2020-10-14 DIAGNOSIS — Z6841 Body Mass Index (BMI) 40.0 and over, adult: Secondary | ICD-10-CM

## 2020-10-14 MED ORDER — OZEMPIC (0.25 OR 0.5 MG/DOSE) 2 MG/1.5ML ~~LOC~~ SOPN: 0.2500 mg | PEN_INJECTOR | SUBCUTANEOUS | 0 refills | Status: DC

## 2020-10-15 NOTE — Progress Notes (Signed)
Chief Complaint:   OBESITY Alexandra Beck is here to discuss her progress with her obesity treatment plan along with follow-up of her obesity related diagnoses. Alexandra Beck is on practicing portion control and making smarter food choices, such as increasing vegetables and decreasing simple carbohydrates and states she is following her eating plan approximately 0% of the time. Alexandra Beck states she is doing 0 minutes 0 times per week.  Today's visit was #: 27 Starting weight: 468 lbs Starting date: 04/17/2018 Today's weight: 445 lbs Today's date: 10/14/2020 Total lbs lost to date: 23 Total lbs lost since last in-office visit: 0  Interim History: Alexandra Beck has not had an office visit since 08/20/2020. She usually sees Dr. Jearld Shines. She is up 6 lbs today. She says she would like to "start over" on the plan and try to adhere to the plan as well as when she first started. She tends to skip meals and then snack on salt/ crunchy items. She denies driniking sugar sweetened beverages. She feels that she does eat enough protein.  Subjective:   1. Depression with anxiety Alexandra Beck feels depressed, but she denies suicidal or homicidal ideas. She asks to be referred to psychiatry. She has been referred in the past by Dr. Jearld Shines but reports she was contacted about establishing with counseling and not psychiatry. She is seeing a counselor weekly and has 2 upcoming appointments scheduled. She has not been taking the Trintellix because she has been out of it. She did not feel it helped much.   2. Type 2 diabetes mellitus with other specified complication, without long-term current use of insulin (Alexandra Beck) Alexandra Beck's diabetes mellitus is well controlled. Last A1c was 5.6. She has been on Rybelsus. She reports it is difficult to take in the morning as directed, especially since she also takes.levothyroxine.   Lab Results  Component Value Date   HGBA1C 5.6 12/17/2019   HGBA1C 5.6 07/04/2019   HGBA1C 5.4 12/04/2018   Lab Results    Component Value Date   MICROALBUR 8.05 (H) 06/26/2007   LDLCALC 106 (H) 12/17/2019   CREATININE 0.99 03/26/2020   Lab Results  Component Value Date   INSULIN 17.1 12/17/2019   INSULIN 23.5 07/04/2019   INSULIN 25.8 (H) 12/04/2018   INSULIN 24.7 07/25/2018   INSULIN 25.0 (H) 04/17/2018   3. Chronic diastolic CHF (congestive heart failure) (HCC) Alexandra Beck takes all medications as ordered, and she denies orthopnea. She notes she always feels she has "extra fluid". She says she is supposed to follow up with cardiology annually.   4. At risk for side effect of medication Alexandra Beck is at risk for drug side effects due to starting Ozempic.  Assessment/Plan:   1. Depression with anxiety Behavior modification techniques were discussed today to help Sharmayne deal with her anxiety and depression. We will refer to Psychiatry ([facility]). We will hold on Trintellix for now and let psychiatry start medication for her.  - Ambulatory referral to Psychiatry  2. Type 2 diabetes mellitus with other specified complication, without long-term current use of insulin ([place]) . Darene agreed to start Ozempic 0.25 mg SubQ weekly with no refills, and we will recheck labs at her next office visit.  - Semaglutide,0.25 or 0.5MG /DOS, (OZEMPIC, 0.25 OR 0.5 MG/DOSE,) 2 MG/1.5ML SOPN; Inject 0.25 mg into the skin once a week.  Dispense: 1.5 mL; Refill: 0  Patient will start at 0.3 mg SQ daily for 3-4 days and then advance to 0.6 mg SQ daily if no nausea.   3. Chronic  diastolic CHF (congestive heart failure) (HCC) Alexandra Beck is to continue taking all her medications as ordered.  4. At risk for side effect of medication Alexandra Beck was given approximately 15 minutes of drug side effect counseling today.  We discussed side effect possibility and risk versus benefits. Alexandra Beck agreed to the medication and will contact this office if these side effects are intolerable.  Repetitive spaced learning was employed today to elicit superior memory  formation and behavioral change.  5. Class 3 severe obesity with serious comorbidity and body mass index (BMI) greater than or equal to 70 in adult, unspecified obesity type ([place]) Alexandra Beck is currently in the action stage of change. As such, her goal is to continue with weight loss efforts. She has agreed to the Category 4 Plan and keeping a food journal and adhering to recommended goals of 550-700 calories and 45 grams of protein at supper daily.   We will recheck fasting labs and IC at her next office visit.  Exercise goals: No exercise has been prescribed at this time.  Behavioral modification strategies: increasing lean protein intake and decreasing simple carbohydrates.  Alexandra Beck has agreed to follow-up with our clinic in 2 to 3 weeks with Dr. Jearld Shines.   Objective:   Blood pressure (!) 127/58, pulse 67, temperature 98 F (36.7 C), height 5\' 2"  (1.575 m), weight (!) 445 lb (201.9 kg), SpO2 94 %. Body mass index is 81.39 kg/m.  General: Cooperative, alert, well developed, in no acute distress. HEENT: Conjunctivae and lids unremarkable. Cardiovascular: Regular rhythm.  Lungs: Normal work of breathing. Neurologic: No focal deficits.   Lab Results  Component Value Date   CREATININE 0.99 03/26/2020   BUN 31 (H) 03/26/2020   NA 141 03/26/2020   K 4.4 03/26/2020   CL 98 03/26/2020   CO2 25 03/26/2020   Lab Results  Component Value Date   ALT 18 12/17/2019   AST 16 12/17/2019   ALKPHOS 82 12/17/2019   BILITOT 0.3 12/17/2019   Lab Results  Component Value Date   HGBA1C 5.6 12/17/2019   HGBA1C 5.6 07/04/2019   HGBA1C 5.4 12/04/2018   HGBA1C 5.8 (H) 07/25/2018   HGBA1C 6.5 03/23/2018   Lab Results  Component Value Date   INSULIN 17.1 12/17/2019   INSULIN 23.5 07/04/2019   INSULIN 25.8 (H) 12/04/2018   INSULIN 24.7 07/25/2018   INSULIN 25.0 (H) 04/17/2018   Lab Results  Component Value Date   TSH 1.980 07/04/2019   Lab Results  Component Value Date   CHOL 194  12/17/2019   HDL 47 12/17/2019   LDLCALC 106 (H) 12/17/2019   LDLDIRECT 141.0 03/02/2016   TRIG 240 (H) 12/17/2019   CHOLHDL 4 03/23/2018   Lab Results  Component Value Date   WBC 5.9 07/25/2018   HGB 14.8 07/25/2018   HCT 44.8 07/25/2018   MCV 88 07/25/2018   PLT 230.0 03/23/2018   No results found for: IRON, TIBC, FERRITIN  Attestation Statements:   Reviewed by clinician on day of visit: allergies, medications, problem list, medical history, surgical history, family history, social history, and previous encounter notes.   Wilhemena Durie, am acting as Location manager for [employer], FNP-C.  I have reviewed the above documentation for accuracy and completeness, and I agree with the above. -  Georgianne Fick, FNP

## 2020-10-16 ENCOUNTER — Ambulatory Visit (INDEPENDENT_AMBULATORY_CARE_PROVIDER_SITE_OTHER): Payer: BC Managed Care – PPO | Admitting: Psychology

## 2020-10-16 DIAGNOSIS — F331 Major depressive disorder, recurrent, moderate: Secondary | ICD-10-CM

## 2020-10-19 ENCOUNTER — Other Ambulatory Visit: Payer: Self-pay | Admitting: Cardiology

## 2020-10-19 ENCOUNTER — Encounter (INDEPENDENT_AMBULATORY_CARE_PROVIDER_SITE_OTHER): Payer: Self-pay | Admitting: Family Medicine

## 2020-10-19 DIAGNOSIS — E119 Type 2 diabetes mellitus without complications: Secondary | ICD-10-CM | POA: Insufficient documentation

## 2020-10-28 ENCOUNTER — Ambulatory Visit: Payer: BC Managed Care – PPO | Admitting: Psychology

## 2020-10-30 ENCOUNTER — Telehealth: Payer: Self-pay | Admitting: Pulmonary Disease

## 2020-10-30 NOTE — Telephone Encounter (Signed)
lmtcb for pt. Called the number provided below to initiate PA.  Spoke with rep who states that pt taking 1-100mg  tab daily and 1-200mg  tab of modafinil daily is within their quantity limit and does not require any prior authorization.  Rep states the issue is that it is too soon to fill this medication.  The  200mg  tab is eligible for refill on 12/6 and the 100mg  tab is eligible for refill on 12/16.

## 2020-11-03 NOTE — Telephone Encounter (Signed)
Called and spoke with patient to let her know about Ashley's message med medications. She expressed understanding. Nothing further needed at this time.

## 2020-11-03 NOTE — Telephone Encounter (Signed)
Patient is returning phone call. Patient phone number is [phone].

## 2020-11-11 ENCOUNTER — Other Ambulatory Visit: Payer: Self-pay

## 2020-11-11 ENCOUNTER — Encounter (INDEPENDENT_AMBULATORY_CARE_PROVIDER_SITE_OTHER): Payer: Self-pay | Admitting: Family Medicine

## 2020-11-11 ENCOUNTER — Ambulatory Visit (INDEPENDENT_AMBULATORY_CARE_PROVIDER_SITE_OTHER): Payer: BC Managed Care – PPO | Admitting: Family Medicine

## 2020-11-11 VITALS — BP 140/86 | HR 70 | Temp 98.0°F | Ht 62.0 in | Wt >= 6400 oz

## 2020-11-11 DIAGNOSIS — E559 Vitamin D deficiency, unspecified: Secondary | ICD-10-CM

## 2020-11-11 DIAGNOSIS — E1169 Type 2 diabetes mellitus with other specified complication: Secondary | ICD-10-CM

## 2020-11-11 DIAGNOSIS — Z9189 Other specified personal risk factors, not elsewhere classified: Secondary | ICD-10-CM

## 2020-11-11 DIAGNOSIS — Z6841 Body Mass Index (BMI) 40.0 and over, adult: Secondary | ICD-10-CM

## 2020-11-11 MED ORDER — OZEMPIC (1 MG/DOSE) 2 MG/1.5ML ~~LOC~~ SOPN: 1.0000 mg | PEN_INJECTOR | SUBCUTANEOUS | 0 refills | Status: DC

## 2020-11-11 MED ORDER — VITAMIN D (ERGOCALCIFEROL) 1.25 MG (50000 UNIT) PO CAPS: 50000.0000 [IU] | ORAL_CAPSULE | ORAL | 0 refills | Status: DC

## 2020-11-12 ENCOUNTER — Ambulatory Visit (INDEPENDENT_AMBULATORY_CARE_PROVIDER_SITE_OTHER): Payer: BC Managed Care – PPO | Admitting: Psychology

## 2020-11-12 DIAGNOSIS — F331 Major depressive disorder, recurrent, moderate: Secondary | ICD-10-CM

## 2020-11-12 LAB — COMPREHENSIVE METABOLIC PANEL
ALT: 13 IU/L (ref 0–32)
AST: 14 IU/L (ref 0–40)
Albumin/Globulin Ratio: 1.5 (ref 1.2–2.2)
Albumin: 4.1 g/dL (ref 3.8–4.8)
Alkaline Phosphatase: 82 IU/L (ref 44–121)
BUN/Creatinine Ratio: 22 (ref 9–23)
BUN: 22 mg/dL (ref 6–24)
Bilirubin Total: 0.3 mg/dL (ref 0.0–1.2)
CO2: 25 mmol/L (ref 20–29)
Calcium: 9.9 mg/dL (ref 8.7–10.2)
Chloride: 101 mmol/L (ref 96–106)
Creatinine, Ser: 1 mg/dL (ref 0.57–1.00)
GFR calc Af Amer: 81 mL/min/{1.73_m2} (ref >59)
GFR calc non Af Amer: 71 mL/min/{1.73_m2} (ref >59)
Globulin, Total: 2.8 g/dL (ref 1.5–4.5)
Glucose: 107 mg/dL — ABNORMAL HIGH (ref 65–99)
Potassium: 4.4 mmol/L (ref 3.5–5.2)
Sodium: 141 mmol/L (ref 134–144)
Total Protein: 6.9 g/dL (ref 6.0–8.5)

## 2020-11-12 LAB — LIPID PANEL WITH LDL/HDL RATIO
Cholesterol, Total: 185 mg/dL (ref 100–199)
HDL: 53 mg/dL (ref >39)
LDL Chol Calc (NIH): 103 mg/dL — ABNORMAL HIGH (ref 0–99)
LDL/HDL Ratio: 1.9 ratio (ref 0.0–3.2)
Triglycerides: 169 mg/dL — ABNORMAL HIGH (ref 0–149)
VLDL Cholesterol Cal: 29 mg/dL (ref 5–40)

## 2020-11-12 LAB — HEMOGLOBIN A1C
Est. average glucose Bld gHb Est-mCnc: 123 mg/dL
Hgb A1c MFr Bld: 5.9 % — ABNORMAL HIGH (ref 4.8–5.6)

## 2020-11-12 LAB — INSULIN, RANDOM: INSULIN: 23.6 u[IU]/mL (ref 2.6–24.9)

## 2020-11-12 LAB — VITAMIN D 25 HYDROXY (VIT D DEFICIENCY, FRACTURES): Vit D, 25-Hydroxy: 30.2 ng/mL (ref 30.0–100.0)

## 2020-11-16 NOTE — Progress Notes (Signed)
Chief Complaint:   OBESITY Alexandra Beck is here to discuss her progress with her obesity treatment plan along with follow-up of her obesity related diagnoses. [name] is on the Category 4 Plan and keeping a food journal and adhering to recommended goals of 550-700 calories and 45 grams of protein and states she is following her eating plan approximately 50% of the time. Alexandra Beck states she is doing 0 minutes 0 times per week.  Today's visit was #: 72 Starting weight: 468 lbs Starting date: 04/17/2018 Today's weight: 441 lbs Today's date: 11/11/2020 Total lbs lost to date: 27 Total lbs lost since last in-office visit: 4  Interim History: Alexandra Beck has returned to our clinic since mid November. She was struggling with Rybelsus but she is doing well on Ozempic. She feels like she has struggled to stay on the Category 4 plan. She finds it hard to acquire the food on the plan consistently secondary to job crisis. She has tried to consistently be mindful of getting protein in daily.  Subjective:   1. Vitamin D deficiency Essence denies nausea, vomiting, or muscle weakness, but she notes fatigue. She is on prescription Vit D. Last Vit D level was 38.2.  2. Type 2 diabetes mellitus with other specified complication, without long-term current use of insulin (HCC) Alexandra Beck is on Ozempic with some improvement in control of eating. She is on 0.5 mg SubQ weekly.  3. At risk for side effect of medication [name] is at risk for drug side effects due to increased Ozempic to 1 mg (double what is taking currently).  Assessment/Plan:   1. Vitamin D deficiency Low Vitamin D level contributes to fatigue and are associated with obesity, breast, and colon cancer. We will check labs today, and we will refill prescription Vitamin D for 1 month. [name] will follow-up for routine testing of Vitamin D, at least 2-3 times per year to avoid over-replacement.  - Vitamin D, Ergocalciferol, (DRISDOL) 1.25 MG (50000 UNIT) CAPS capsule;  Take 1 capsule (50,000 Units total) by mouth every 7 (seven) days.  Dispense: 4 capsule; Refill: 0 - VITAMIN D 25 Hydroxy (Vit-D Deficiency, Fractures)  2. Type 2 diabetes mellitus with other specified complication, without long-term current use of insulin (HCC) Good blood sugar control is important to decrease the likelihood of diabetic complications such as nephropathy, neuropathy, limb loss, blindness, coronary artery disease, and death. Intensive lifestyle modification including diet, exercise and weight loss are the first line of treatment for diabetes. Jing agreed to increase Ozempic to 1 mg SubQ weekly with no refills, and we will check labs today.  - Semaglutide, 1 MG/DOSE, (OZEMPIC, 1 MG/DOSE,) 2 MG/1.5ML SOPN; Inject 1 mg into the skin once a week.  Dispense: 1.5 mL; Refill: 0 - Comprehensive metabolic panel - Hemoglobin A1c - Insulin, random - Lipid Panel With LDL/HDL Ratio  3. At risk for side effect of medication [name] was given approximately 15 minutes of drug side effect counseling today.  We discussed side effect possibility and risk versus benefits. [name] agreed to the medication and will contact this office if these side effects are intolerable.  Repetitive spaced learning was employed today to elicit superior memory formation and behavioral change.  4. Class 3 severe obesity with serious comorbidity and body mass index (BMI) greater than or equal to 70 in adult, unspecified obesity type ([place]) Alexandra Beck is currently in the action stage of change. As such, her goal is to continue with weight loss efforts. She has agreed to the  Category 4 Plan.   Exercise goals: No exercise has been prescribed at this time.  Behavioral modification strategies: increasing lean protein intake, meal planning and cooking strategies, keeping healthy foods in the home, holiday eating strategies  and planning for success.  Alexandra Beck has agreed to follow-up with our clinic in 3 weeks. She was informed of the  importance of frequent follow-up visits to maximize her success with intensive lifestyle modifications for her multiple health conditions.   Objective:   Blood pressure 140/86, pulse 70, temperature 98 F (36.7 C), temperature source Oral, height 5\' 2"  (1.575 m), weight (!) 441 lb (200 kg), last menstrual period 11/10/2020, SpO2 95 %. Body mass index is 80.66 kg/m.  General: Cooperative, alert, well developed, in no acute distress. HEENT: Conjunctivae and lids unremarkable. Cardiovascular: Regular rhythm.  Lungs: Normal work of breathing. Neurologic: No focal deficits.   Lab Results  Component Value Date   CREATININE 1.00 11/11/2020   BUN 22 11/11/2020   NA 141 11/11/2020   K 4.4 11/11/2020   CL 101 11/11/2020   CO2 25 11/11/2020   Lab Results  Component Value Date   ALT 13 11/11/2020   AST 14 11/11/2020   ALKPHOS 82 11/11/2020   BILITOT 0.3 11/11/2020   Lab Results  Component Value Date   HGBA1C 5.9 (H) 11/11/2020   HGBA1C 5.6 12/17/2019   HGBA1C 5.6 07/04/2019   HGBA1C 5.4 12/04/2018   HGBA1C 5.8 (H) 07/25/2018   Lab Results  Component Value Date   INSULIN 23.6 11/11/2020   INSULIN 17.1 12/17/2019   INSULIN 23.5 07/04/2019   INSULIN 25.8 (H) 12/04/2018   INSULIN 24.7 07/25/2018   Lab Results  Component Value Date   TSH 1.980 07/04/2019   Lab Results  Component Value Date   CHOL 185 11/11/2020   HDL 53 11/11/2020   LDLCALC 103 (H) 11/11/2020   LDLDIRECT 141.0 03/02/2016   TRIG 169 (H) 11/11/2020   CHOLHDL 4 03/23/2018   Lab Results  Component Value Date   WBC 5.9 07/25/2018   HGB 14.8 07/25/2018   HCT 44.8 07/25/2018   MCV 88 07/25/2018   PLT 230.0 03/23/2018   No results found for: IRON, TIBC, FERRITIN  Attestation Statements:   Reviewed by clinician on day of visit: allergies, medications, problem list, medical history, surgical history, family history, social history, and previous encounter notes.   I, Trixie Dredge, am acting as  transcriptionist for Coralie Common, MD.  I have reviewed the above documentation for accuracy and completeness, and I agree with the above. - Jinny Blossom, MD

## 2020-11-23 ENCOUNTER — Ambulatory Visit (INDEPENDENT_AMBULATORY_CARE_PROVIDER_SITE_OTHER): Payer: BC Managed Care – PPO | Admitting: Psychology

## 2020-11-23 DIAGNOSIS — F331 Major depressive disorder, recurrent, moderate: Secondary | ICD-10-CM

## 2020-12-02 ENCOUNTER — Ambulatory Visit (INDEPENDENT_AMBULATORY_CARE_PROVIDER_SITE_OTHER): Payer: BC Managed Care – PPO | Admitting: Psychology

## 2020-12-02 DIAGNOSIS — F331 Major depressive disorder, recurrent, moderate: Secondary | ICD-10-CM | POA: Diagnosis not present

## 2020-12-05 ENCOUNTER — Other Ambulatory Visit: Payer: Self-pay | Admitting: Family

## 2020-12-05 ENCOUNTER — Other Ambulatory Visit (INDEPENDENT_AMBULATORY_CARE_PROVIDER_SITE_OTHER): Payer: Self-pay | Admitting: Family Medicine

## 2020-12-05 DIAGNOSIS — E1169 Type 2 diabetes mellitus with other specified complication: Secondary | ICD-10-CM

## 2020-12-07 ENCOUNTER — Encounter: Payer: Self-pay | Admitting: Family

## 2020-12-07 ENCOUNTER — Other Ambulatory Visit (INDEPENDENT_AMBULATORY_CARE_PROVIDER_SITE_OTHER): Payer: Self-pay

## 2020-12-07 ENCOUNTER — Encounter (INDEPENDENT_AMBULATORY_CARE_PROVIDER_SITE_OTHER): Payer: Self-pay | Admitting: Family Medicine

## 2020-12-07 DIAGNOSIS — E1169 Type 2 diabetes mellitus with other specified complication: Secondary | ICD-10-CM

## 2020-12-07 MED ORDER — OZEMPIC (1 MG/DOSE) 2 MG/1.5ML ~~LOC~~ SOPN: 1.0000 mg | PEN_INJECTOR | SUBCUTANEOUS | 0 refills | Status: DC

## 2020-12-07 NOTE — Telephone Encounter (Signed)
Dr. U

## 2020-12-09 ENCOUNTER — Encounter (INDEPENDENT_AMBULATORY_CARE_PROVIDER_SITE_OTHER): Payer: Self-pay

## 2020-12-10 ENCOUNTER — Telehealth (INDEPENDENT_AMBULATORY_CARE_PROVIDER_SITE_OTHER): Payer: BC Managed Care – PPO | Admitting: Family Medicine

## 2020-12-10 ENCOUNTER — Encounter (INDEPENDENT_AMBULATORY_CARE_PROVIDER_SITE_OTHER): Payer: Self-pay | Admitting: Family Medicine

## 2020-12-10 ENCOUNTER — Encounter (INDEPENDENT_AMBULATORY_CARE_PROVIDER_SITE_OTHER): Payer: Self-pay

## 2020-12-10 DIAGNOSIS — E1169 Type 2 diabetes mellitus with other specified complication: Secondary | ICD-10-CM | POA: Diagnosis not present

## 2020-12-10 DIAGNOSIS — Z6841 Body Mass Index (BMI) 40.0 and over, adult: Secondary | ICD-10-CM

## 2020-12-10 DIAGNOSIS — E559 Vitamin D deficiency, unspecified: Secondary | ICD-10-CM | POA: Diagnosis not present

## 2020-12-10 DIAGNOSIS — E66813 Obesity, class 3: Secondary | ICD-10-CM

## 2020-12-10 MED ORDER — VITAMIN D (ERGOCALCIFEROL) 1.25 MG (50000 UNIT) PO CAPS: 50000.0000 [IU] | ORAL_CAPSULE | ORAL | 0 refills | Status: DC

## 2020-12-11 ENCOUNTER — Ambulatory Visit (INDEPENDENT_AMBULATORY_CARE_PROVIDER_SITE_OTHER): Payer: BC Managed Care – PPO | Admitting: Psychology

## 2020-12-11 DIAGNOSIS — F331 Major depressive disorder, recurrent, moderate: Secondary | ICD-10-CM | POA: Diagnosis not present

## 2020-12-16 ENCOUNTER — Ambulatory Visit: Payer: BC Managed Care – PPO | Admitting: Psychology

## 2020-12-16 NOTE — Progress Notes (Signed)
TeleHealth Visit:  Due to the COVID-19 pandemic, this visit was completed with telemedicine (audio/video) technology to reduce patient and provider exposure as well as to preserve personal protective equipment.   Alexandra Beck has verbally consented to this TeleHealth visit. The patient is located at home, the provider is located at the [facility] office. The participants in this visit include the listed provider and patient. The visit was conducted today via MyChart video.  Chief Complaint: OBESITY Alexandra Beck is here to discuss her progress with her obesity treatment plan along with follow-up of her obesity related diagnoses. Alexandra Beck is on the Category 4 Plan and states she is following her eating plan approximately 70% of the time. Alexandra Beck states she is walking for 10 minutes 5 times per week.  Today's visit was #: 33 Starting weight: 468 lbs Starting date: 04/17/2018  Interim History: Alexandra Beck had a good holiday celebrating with her wife's family and then her mother.  She is experiencing the nausea feeling if she goes too long between eating intervals.  She is still seeing her therapist.  Subjective:   1. Type 2 diabetes mellitus with other specified complication, without long-term current use of insulin (HCC) She is on a GLP-1.  She is still taking Ozempic 0.5 mg daily with nausea if she waits too long between eating.  Lab Results  Component Value Date   HGBA1C 5.9 (H) 11/11/2020   HGBA1C 5.6 12/17/2019   HGBA1C 5.6 07/04/2019   Lab Results  Component Value Date   MICROALBUR 8.05 (H) 06/26/2007   LDLCALC 103 (H) 11/11/2020   CREATININE 1.00 11/11/2020   Lab Results  Component Value Date   INSULIN 23.6 11/11/2020   INSULIN 17.1 12/17/2019   INSULIN 23.5 07/04/2019   INSULIN 25.8 (H) 12/04/2018   INSULIN 24.7 07/25/2018   2. Vitamin D deficiency Shenaya's Vitamin D level was 30.2 on 11/11/2020. She is currently taking prescription vitamin D 50,000 IU each week. She denies  nausea, vomiting or muscle weakness.  She endorses fatigue.  Assessment/Plan:   1. Type 2 diabetes mellitus with other specified complication, without long-term current use of insulin (HCC) Good blood sugar control is important to decrease the likelihood of diabetic complications such as nephropathy, neuropathy, limb loss, blindness, coronary artery disease, and death. Intensive lifestyle modification including diet, exercise and weight loss are the first line of treatment for diabetes.  Continue Ozempic 0.5 mg subcutaneously weekly.   2. Vitamin D deficiency Low Vitamin D level contributes to fatigue and are associated with obesity, breast, and colon cancer. She agrees to continue to take prescription Vitamin D @50 ,000 IU every week and will follow-up for routine testing of Vitamin D, at least 2-3 times per year to avoid over-replacement.  -Refill Vitamin D, Ergocalciferol, (DRISDOL) 1.25 MG (50000 UNIT) CAPS capsule; Take 1 capsule (50,000 Units total) by mouth every 7 (seven) days.  Dispense: 4 capsule; Refill: 0  3. Class 3 severe obesity with serious comorbidity and body mass index (BMI) greater than or equal to 70 in adult, unspecified obesity type ([place])  Alexandra Beck is currently in the action stage of change. As such, her goal is to continue with weight loss efforts. She has agreed to the Category 4 Plan.   Exercise goals: No exercise has been prescribed at this time.  Behavioral modification strategies: increasing lean protein intake, meal planning and cooking strategies, keeping healthy foods in the home and planning for success.  Alexandra Beck has agreed to follow-up with our clinic in  2 weeks. She was informed of the importance of frequent follow-up visits to maximize her success with intensive lifestyle modifications for her multiple health conditions.  Objective:   VITALS: Per patient if applicable, see vitals. GENERAL: Alert and in no acute distress. CARDIOPULMONARY: No increased WOB.  Speaking in clear sentences.  PSYCH: Pleasant and cooperative. Speech normal rate and rhythm. Affect is appropriate. Insight and judgement are appropriate. Attention is focused, linear, and appropriate.  NEURO: Oriented as arrived to appointment on time with no prompting.   Lab Results  Component Value Date   CREATININE 1.00 11/11/2020   BUN 22 11/11/2020   NA 141 11/11/2020   K 4.4 11/11/2020   CL 101 11/11/2020   CO2 25 11/11/2020   Lab Results  Component Value Date   ALT 13 11/11/2020   AST 14 11/11/2020   ALKPHOS 82 11/11/2020   BILITOT 0.3 11/11/2020   Lab Results  Component Value Date   HGBA1C 5.9 (H) 11/11/2020   HGBA1C 5.6 12/17/2019   HGBA1C 5.6 07/04/2019   HGBA1C 5.4 12/04/2018   HGBA1C 5.8 (H) 07/25/2018   Lab Results  Component Value Date   INSULIN 23.6 11/11/2020   INSULIN 17.1 12/17/2019   INSULIN 23.5 07/04/2019   INSULIN 25.8 (H) 12/04/2018   INSULIN 24.7 07/25/2018   Lab Results  Component Value Date   TSH 1.980 07/04/2019   Lab Results  Component Value Date   CHOL 185 11/11/2020   HDL 53 11/11/2020   LDLCALC 103 (H) 11/11/2020   LDLDIRECT 141.0 03/02/2016   TRIG 169 (H) 11/11/2020   CHOLHDL 4 03/23/2018   Lab Results  Component Value Date   WBC 5.9 07/25/2018   HGB 14.8 07/25/2018   HCT 44.8 07/25/2018   MCV 88 07/25/2018   PLT 230.0 03/23/2018   Attestation Statements:   Reviewed by clinician on day of visit: allergies, medications, problem list, medical history, surgical history, family history, social history, and previous encounter notes.  I, Water quality scientist, CMA, am acting as transcriptionist for Coralie Common, MD. I have reviewed the above documentation for accuracy and completeness, and I agree with the above. - Jinny Blossom, MD

## 2020-12-18 ENCOUNTER — Ambulatory Visit (INDEPENDENT_AMBULATORY_CARE_PROVIDER_SITE_OTHER): Payer: BC Managed Care – PPO | Admitting: Psychology

## 2020-12-18 DIAGNOSIS — F331 Major depressive disorder, recurrent, moderate: Secondary | ICD-10-CM | POA: Diagnosis not present

## 2020-12-23 ENCOUNTER — Ambulatory Visit (INDEPENDENT_AMBULATORY_CARE_PROVIDER_SITE_OTHER): Payer: BC Managed Care – PPO | Admitting: Psychology

## 2020-12-23 DIAGNOSIS — F331 Major depressive disorder, recurrent, moderate: Secondary | ICD-10-CM

## 2020-12-26 ENCOUNTER — Encounter (INDEPENDENT_AMBULATORY_CARE_PROVIDER_SITE_OTHER): Payer: Self-pay | Admitting: Family Medicine

## 2020-12-29 ENCOUNTER — Ambulatory Visit (INDEPENDENT_AMBULATORY_CARE_PROVIDER_SITE_OTHER): Payer: Self-pay | Admitting: Family Medicine

## 2020-12-30 ENCOUNTER — Ambulatory Visit (INDEPENDENT_AMBULATORY_CARE_PROVIDER_SITE_OTHER): Payer: BC Managed Care – PPO | Admitting: Psychology

## 2020-12-30 DIAGNOSIS — F331 Major depressive disorder, recurrent, moderate: Secondary | ICD-10-CM

## 2020-12-31 ENCOUNTER — Ambulatory Visit: Payer: Self-pay | Admitting: Adult Health

## 2021-01-06 ENCOUNTER — Ambulatory Visit (INDEPENDENT_AMBULATORY_CARE_PROVIDER_SITE_OTHER): Payer: BC Managed Care – PPO | Admitting: Psychology

## 2021-01-06 DIAGNOSIS — F331 Major depressive disorder, recurrent, moderate: Secondary | ICD-10-CM

## 2021-01-07 ENCOUNTER — Other Ambulatory Visit: Payer: Self-pay

## 2021-01-07 ENCOUNTER — Encounter (INDEPENDENT_AMBULATORY_CARE_PROVIDER_SITE_OTHER): Payer: Self-pay | Admitting: Family Medicine

## 2021-01-07 DIAGNOSIS — I10 Essential (primary) hypertension: Secondary | ICD-10-CM

## 2021-01-08 ENCOUNTER — Other Ambulatory Visit (INDEPENDENT_AMBULATORY_CARE_PROVIDER_SITE_OTHER): Payer: Self-pay | Admitting: Family Medicine

## 2021-01-08 ENCOUNTER — Other Ambulatory Visit: Payer: Self-pay | Admitting: Cardiology

## 2021-01-08 ENCOUNTER — Ambulatory Visit: Payer: BC Managed Care – PPO | Admitting: Family

## 2021-01-08 ENCOUNTER — Encounter: Payer: Self-pay | Admitting: Family

## 2021-01-08 VITALS — BP 138/86 | HR 88 | Temp 98.3°F | Ht 62.0 in | Wt >= 6400 oz

## 2021-01-08 DIAGNOSIS — R7303 Prediabetes: Secondary | ICD-10-CM | POA: Diagnosis not present

## 2021-01-08 DIAGNOSIS — E785 Hyperlipidemia, unspecified: Secondary | ICD-10-CM

## 2021-01-08 DIAGNOSIS — E039 Hypothyroidism, unspecified: Secondary | ICD-10-CM | POA: Diagnosis not present

## 2021-01-08 DIAGNOSIS — E559 Vitamin D deficiency, unspecified: Secondary | ICD-10-CM

## 2021-01-08 DIAGNOSIS — I10 Essential (primary) hypertension: Secondary | ICD-10-CM

## 2021-01-08 DIAGNOSIS — E1169 Type 2 diabetes mellitus with other specified complication: Secondary | ICD-10-CM

## 2021-01-08 LAB — TSH: TSH: 2.94 u[IU]/mL (ref 0.35–4.50)

## 2021-01-08 MED ORDER — METOPROLOL SUCCINATE ER 50 MG PO TB24: ORAL_TABLET | ORAL | 3 refills | Status: DC

## 2021-01-08 MED ORDER — LEVOTHYROXINE SODIUM 150 MCG PO TABS: ORAL_TABLET | ORAL | 0 refills | Status: DC

## 2021-01-08 MED ORDER — METFORMIN HCL 500 MG PO TABS: ORAL_TABLET | ORAL | 3 refills | Status: DC

## 2021-01-08 MED ORDER — LOSARTAN POTASSIUM 100 MG PO TABS: 100.0000 mg | ORAL_TABLET | Freq: Every day | ORAL | 0 refills | Status: DC

## 2021-01-08 MED ORDER — ROSUVASTATIN CALCIUM 10 MG PO TABS: ORAL_TABLET | ORAL | 3 refills | Status: DC

## 2021-01-08 MED ORDER — SPIRONOLACTONE 25 MG PO TABS: 25.0000 mg | ORAL_TABLET | Freq: Every day | ORAL | 0 refills | Status: DC

## 2021-01-08 MED ORDER — TORSEMIDE 20 MG PO TABS: 40.0000 mg | ORAL_TABLET | Freq: Every day | ORAL | 0 refills | Status: DC

## 2021-01-08 NOTE — Progress Notes (Signed)
Alexandra Beck is a 40 y.o. female with the following history as recorded in EpicCare:  Patient Active Problem List   Diagnosis Date Noted  . Diabetes mellitus ([place]) 10/19/2020  . Psoriasis 12/26/2019  . 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 Hypersomnolence 05/11/2018  . Vitamin D deficiency 05/02/2018  . Cellulitis of left leg 04/17/2017  . Cellulitis of right leg 01/11/2017  . PVC's (premature ventricular contractions) 12/02/2016  . Chronic diastolic CHF (congestive heart failure) ([place])   . Acute kidney injury ([place])   . Hyperlipidemia   . Diabetes mellitus type 2 in obese ([place])   . Hypothyroidism   . GERD (gastroesophageal reflux disease)   . Depression   . Venous insufficiency of leg   . Recurrent cellulitis of lower leg 12/25/2012  . OSA (obstructive sleep apnea) 12/25/2012  . Obesity hypoventilation syndrome ([place]) 12/25/2012  . Class 3 severe obesity with serious comorbidity and body mass index (BMI) greater than or equal to 70 in adult ([place]) 12/24/2012  . Essential hypertension 06/26/2007    Current Outpatient Medications  Medication Sig Dispense Refill  . amoxicillin (AMOXIL) 500 MG capsule TAKE 1 CAPSULE(500 MG) BY MOUTH TWICE DAILY 60 capsule 3  . losartan (COZAAR) 50 MG tablet TAKE 1 TABLET BY MOUTH DAILY (Patient taking differently: 100 mg.) 90 tablet 3  . modafinil (PROVIGIL) 100 MG tablet TAKE 1 TABLET BY MOUTH around 2 pm 30 tablet 5  . modafinil (PROVIGIL) 200 MG tablet Take 1 tablet (200 mg total) by mouth daily. Take 1 tab daily 9 am 30 tablet 3  . Semaglutide, 1 MG/DOSE, (OZEMPIC, 1 MG/DOSE,) 2 MG/1.5ML SOPN Inject 1 mg into the skin once a week. 1.5 mL 0  . traMADol (ULTRAM) 50 MG tablet Take 1-2 tablets (50-100 mg total) by mouth every 8 (eight) hours as needed. 20 tablet 0  . Vitamin D, Ergocalciferol, (DRISDOL) 1.25 MG (50000 UNIT) CAPS capsule Take 1 capsule (50,000 Units total) by mouth every 7 (seven) days. 4  capsule 0  . levothyroxine (SYNTHROID) 150 MCG tablet TAKE 1 TABLET(150 MCG) BY MOUTH DAILY BEFORE BREAKFAST 90 tablet 0  . metFORMIN (GLUCOPHAGE) 500 MG tablet TAKE 1 TABLET(500 MG) BY MOUTH DAILY 90 tablet 3  . metoprolol succinate (TOPROL-XL) 50 MG 24 hr tablet TAKE 1 TABLET BY MOUTH DAILY WITH OR IMMEDIATELY FOLLOWING A MEAL 90 tablet 3  . rosuvastatin (CRESTOR) 10 MG tablet TAKE 1 TABLET(10 MG) BY MOUTH DAILY 90 tablet 3  . spironolactone (ALDACTONE) 25 MG tablet Take 1 tablet (25 mg total) by mouth daily. Please make yearly appt with Dr. Meda Coffee for January 2022 for future refills. Thank you 1st attempt 30 tablet 0  . torsemide (DEMADEX) 20 MG tablet Take 2 tablets (40 mg total) by mouth daily. Please make yearly appt with Dr. Meda Coffee for January 2022 for future refills. 1st attempt 60 tablet 0   No current facility-administered medications for this visit.    Allergies: Aspirin, Doxycycline, Lisinopril, and Niaspan [niacin er]  Past Medical History:  Diagnosis Date  . Acute renal failure (ARF) ([place]) 11/2012    multifactorial-likely secondary to ATN in the setting of sepsis and hypotension, and also from rhabdomyolysis  . Anemia   . Cellulitis   . Chronic diastolic CHF (congestive heart failure) ([place])   . Depression   . Diabetes mellitus ([place])   . GERD (gastroesophageal reflux disease)   . Hyperlipidemia   . Hypertension   . Hypothyroidism   .  Morbid obesity with BMI of 70 and over, adult ([place])   . Obesity hypoventilation syndrome ([place])   . OSA (obstructive sleep apnea)   . PVC's (premature ventricular contractions)   . Recurrent cellulitis of lower leg    LLE, venous insuff  . Sepsis ([place]) 11/2012   Secondary to cellulitis  . Sleep apnea   . Venous insufficiency of leg     Past Surgical History:  Procedure Laterality Date  . IR FLUORO GUIDE CV MIDLINE PICC RIGHT  03/28/2017  . IR US GUIDE VASC ACCESS RIGHT  03/28/2017  . WISDOM TOOTH EXTRACTION      Family History  Problem  Relation Age of Onset  . Hypertension Mother   . Diabetes Mother   . High Cholesterol Mother   . Thyroid disease Mother   . Sleep apnea Mother   . Obesity Mother   . Hypertension Father   . Heart disease Father        before age 10  . High Cholesterol Father   . Sleep apnea Father   . Obesity Father     Social History   Tobacco Use  . Smoking status: Former Smoker    Years: 0.00  . Smokeless tobacco: Never Used  Substance Use Topics  . Alcohol use: No    Alcohol/week: 0.0 standard drinks    Subjective:   1 year follow up on chronic care needs; majority of healthcare needs are managed by specialists including cardiology/ pulmonology/ weight loss provider; Needs to get her thyroid level checked today; Would like to get refill on Tramadol- typically makes #20 tablets last for a year; last written by me in 2020;   Overdue to see GYN- considering establishing with Dr. Radene Knee at [facility]; hesitant to schedule at this time;   Had COVID at end of January- has had 2 shots but no booster;    Objective:  Vitals:   01/08/21 1416  BP: 138/86  Pulse: 88  Temp: 98.3 F (36.8 C)  TempSrc: Oral  SpO2: 90%  Weight: (!) 440 lb (199.6 kg)  Height: 5\' 2"  (1.575 m)    General: Well developed, well nourished, in no acute distress  Skin : Warm and dry.  Head: Normocephalic and atraumatic  Eyes: Sclera and conjunctiva clear; pupils round and reactive to light; extraocular movements intact  Ears: External normal; canals clear; tympanic membranes normal  Oropharynx: Pink, supple. No suspicious lesions  Neck: Supple without thyromegaly, adenopathy  Lungs: Respirations unlabored; clear to auscultation bilaterally without wheeze, rales, rhonchi  CVS exam: normal rate and regular rhythm.  Abdomen: Soft; nontender; nondistended; normoactive bowel sounds; no masses or hepatosplenomegaly  Musculoskeletal: No deformities; no active joint inflammation  Extremities: No edema,  cyanosis, clubbing  Vessels: Symmetric bilaterally  Neurologic: Alert and oriented; speech intact; face symmetrical; moves all extremities well; CNII-XII intact without focal deficit   Assessment:  1. Hypothyroidism, unspecified type   2. Essential hypertension   3. Hyperlipidemia, unspecified hyperlipidemia type   4. Pre-diabetes     Plan:  Labs and refills updated; continue to see cardiology and pulmonology as scheduled; continue with weight loss specialist; encouraged to establish with GYN- she will consider/ does not want mammogram ordered today; Plan for COVID booster at end of April;  Time spent 30 minutes;  This visit occurred during the SARS-CoV-2 public health emergency.  Safety protocols were in place, including screening questions prior to the visit, additional usage of staff PPE, and extensive cleaning of exam room  while observing appropriate contact time as indicated for disinfecting solutions.     No follow-ups on file.  No orders of the defined types were placed in this encounter.   Requested Prescriptions   Signed Prescriptions Disp Refills  . metFORMIN (GLUCOPHAGE) 500 MG tablet 90 tablet 3    Sig: TAKE 1 TABLET(500 MG) BY MOUTH DAILY  . levothyroxine (SYNTHROID) 150 MCG tablet 90 tablet 0    Sig: TAKE 1 TABLET(150 MCG) BY MOUTH DAILY BEFORE BREAKFAST  . rosuvastatin (CRESTOR) 10 MG tablet 90 tablet 3    Sig: TAKE 1 TABLET(10 MG) BY MOUTH DAILY  . metoprolol succinate (TOPROL-XL) 50 MG 24 hr tablet 90 tablet 3    Sig: TAKE 1 TABLET BY MOUTH DAILY WITH OR IMMEDIATELY FOLLOWING A MEAL

## 2021-01-08 NOTE — Addendum Note (Signed)
Addended by: Nuala Alpha on: 01/08/2021 04:05 PM   Modules accepted: Orders

## 2021-01-10 NOTE — Telephone Encounter (Signed)
Please address the clinical questions and let me know about the fasting IC so I can schedule.  Thank you

## 2021-01-11 NOTE — Telephone Encounter (Signed)
Last OV with Dr Jearld Shines, waiting for appt date to be scheduled to refill-CAS

## 2021-01-11 NOTE — Telephone Encounter (Signed)
Ok to schedule at pt convenience as long as she can fast for the appt. Thanks

## 2021-01-11 NOTE — Telephone Encounter (Signed)
I will take care of meds after appt is scheduled. Thanks

## 2021-01-13 ENCOUNTER — Ambulatory Visit (INDEPENDENT_AMBULATORY_CARE_PROVIDER_SITE_OTHER): Payer: BC Managed Care – PPO | Admitting: Psychology

## 2021-01-13 DIAGNOSIS — F331 Major depressive disorder, recurrent, moderate: Secondary | ICD-10-CM | POA: Diagnosis not present

## 2021-01-14 ENCOUNTER — Telehealth (INDEPENDENT_AMBULATORY_CARE_PROVIDER_SITE_OTHER): Payer: Self-pay

## 2021-01-14 NOTE — Telephone Encounter (Signed)
L/mess to see if pt would like 11:20 or 11:40 appt on 01/28/21. CAS

## 2021-01-20 ENCOUNTER — Ambulatory Visit (INDEPENDENT_AMBULATORY_CARE_PROVIDER_SITE_OTHER): Payer: BC Managed Care – PPO | Admitting: Psychology

## 2021-01-20 DIAGNOSIS — F331 Major depressive disorder, recurrent, moderate: Secondary | ICD-10-CM | POA: Diagnosis not present

## 2021-01-22 ENCOUNTER — Other Ambulatory Visit: Payer: Self-pay | Admitting: Cardiology

## 2021-01-27 ENCOUNTER — Ambulatory Visit (INDEPENDENT_AMBULATORY_CARE_PROVIDER_SITE_OTHER): Payer: BC Managed Care – PPO | Admitting: Psychology

## 2021-01-27 DIAGNOSIS — F331 Major depressive disorder, recurrent, moderate: Secondary | ICD-10-CM

## 2021-02-03 ENCOUNTER — Ambulatory Visit (INDEPENDENT_AMBULATORY_CARE_PROVIDER_SITE_OTHER): Payer: BC Managed Care – PPO | Admitting: Psychology

## 2021-02-03 ENCOUNTER — Encounter: Payer: Self-pay | Admitting: Family

## 2021-02-03 DIAGNOSIS — F331 Major depressive disorder, recurrent, moderate: Secondary | ICD-10-CM

## 2021-02-04 ENCOUNTER — Encounter (INDEPENDENT_AMBULATORY_CARE_PROVIDER_SITE_OTHER): Payer: Self-pay

## 2021-02-04 NOTE — Progress Notes (Deleted)
Cardiology Office Note:    Date:  02/04/2021   ID:  Alexandra Beck, DOB 16-Jun-1981, MRN [id number]  PCP:  Marrian Salvage, [facility]  Cardiologist:  Ena Dawley, MD *** Advanced Practice Provider:  No care team member to display Electrophysiologist:  None  {Press F2 to show EP APP, CHF, sleep or structural heart MD               :[id number]}  { Click here to update then REFRESH NOTE - MD (PCP) or APP (Team Member)  Change PCP Type for MD, Specialty for APP is either Cardiology or Clinical Cardiac Electrophysiology  :[id number]}   Referring MD: Marrian Salvage,*   Chief Complaint:  No chief complaint on file.    Patient Profile:    Alexandra Beck is a 40 y.o. female with:   (HFpEF) heart failure with preserved ejection fraction   Super morbid obesity  OHS/OSA  Hypertension   Hyperlipidemia   Hypothyroidism  Diabetes mellitus   Anemia  GERD   Recurrent cellulitis   Venous insufficiency   PVCs   Prior CV studies: Echocardiogram 06/08/18 EF 50-55, normal RVSF  ETT 12/16/16 Poor exercise tolerance; 66% PMHR; non-diagnostic   History of Present Illness:    Ms. Oppedisano was last seen by Dr. Meda Coffee in 1/21 via Telemedicine.  She returns for f/u.  ***      Past Medical History:  Diagnosis Date  . Acute renal failure (ARF) ([place]) 11/2012    multifactorial-likely secondary to ATN in the setting of sepsis and hypotension, and also from rhabdomyolysis  . Anemia   . Cellulitis   . Chronic diastolic CHF (congestive heart failure) ([place])   . Depression   . Diabetes mellitus ([place])   . GERD (gastroesophageal reflux disease)   . Hyperlipidemia   . Hypertension   . Hypothyroidism   . Morbid obesity with BMI of 70 and over, adult ([place])   . Obesity hypoventilation syndrome ([place])   . OSA (obstructive sleep apnea)   . PVC's (premature ventricular contractions)   . Recurrent cellulitis of lower leg    LLE, venous insuff  .  Sepsis ([place]) 11/2012   Secondary to cellulitis  . Sleep apnea   . Venous insufficiency of leg     Current Medications: No outpatient medications have been marked as taking for the 02/05/21 encounter (Appointment) with Richardson Dopp T, PA-C.     Allergies:   Aspirin, Doxycycline, Lisinopril, and Niaspan [niacin er]   Social History   Tobacco Use  . Smoking status: Former Smoker    Years: 0.00  . Smokeless tobacco: Never Used  Vaping Use  . Vaping Use: Never used  Substance Use Topics  . Alcohol use: No    Alcohol/week: 0.0 standard drinks  . Drug use: No     Family Hx: The patient's family history includes Diabetes in her mother; Heart disease in her father; High Cholesterol in her father and mother; Hypertension in her father and mother; Obesity in her father and mother; Sleep apnea in her father and mother; Thyroid disease in her mother.  ROS   EKGs/Labs/Other Test Reviewed:    EKG:  EKG is *** ordered today.  The ekg ordered today demonstrates ***  Recent Labs: 03/26/2020: NT-Pro BNP 145 11/11/2020: ALT 13; BUN 22; Creatinine, Ser 1.00; Potassium 4.4; Sodium 141 01/08/2021: TSH 2.94   Recent Lipid Panel Lab Results  Component Value Date/Time   CHOL 185 11/11/2020  12:11 PM   TRIG 169 (H) 11/11/2020 12:11 PM   HDL 53 11/11/2020 12:11 PM   CHOLHDL 4 03/23/2018 12:24 PM   LDLCALC 103 (H) 11/11/2020 12:11 PM   LDLDIRECT 141.0 03/02/2016 11:01 AM      Risk Assessment/Calculations:   {Does this patient have ATRIAL FIBRILLATION?:[phone]}  Physical Exam:    VS:  There were no vitals taken for this visit.    Wt Readings from Last 3 Encounters:  01/08/21 (!) 440 lb (199.6 kg)  11/11/20 (!) 441 lb (200 kg)  10/14/20 (!) 445 lb (201.9 kg)     Physical Exam ***  ASSESSMENT & PLAN:    ***  {Are you ordering a CV Procedure (e.g. stress test, cath, DCCV, TEE, etc)?   Press F2        :[id number]}    Dispo:  No follow-ups on file.   Medication  Adjustments/Labs and Tests Ordered: Current medicines are reviewed at length with the patient today.  Concerns regarding medicines are outlined above.  Tests Ordered: No orders of the defined types were placed in this encounter.  Medication Changes: No orders of the defined types were placed in this encounter.   Signed, Richardson Dopp, PA-C  02/04/2021 10:12 PM    [facility] [place], [place], [place]  [address] Phone: [phone]; Fax: [phone]

## 2021-02-05 ENCOUNTER — Other Ambulatory Visit: Payer: Self-pay | Admitting: Family

## 2021-02-05 ENCOUNTER — Ambulatory Visit: Payer: BC Managed Care – PPO | Admitting: Physician Assistant

## 2021-02-05 DIAGNOSIS — I10 Essential (primary) hypertension: Secondary | ICD-10-CM

## 2021-02-05 DIAGNOSIS — I493 Ventricular premature depolarization: Secondary | ICD-10-CM

## 2021-02-05 DIAGNOSIS — I5032 Chronic diastolic (congestive) heart failure: Secondary | ICD-10-CM

## 2021-02-05 DIAGNOSIS — E782 Mixed hyperlipidemia: Secondary | ICD-10-CM

## 2021-02-05 MED ORDER — TRAMADOL HCL 50 MG PO TABS: 50.0000 mg | ORAL_TABLET | Freq: Three times a day (TID) | ORAL | 0 refills | Status: DC | PRN

## 2021-02-08 ENCOUNTER — Ambulatory Visit (INDEPENDENT_AMBULATORY_CARE_PROVIDER_SITE_OTHER): Payer: BC Managed Care – PPO | Admitting: Family Medicine

## 2021-02-08 ENCOUNTER — Other Ambulatory Visit (INDEPENDENT_AMBULATORY_CARE_PROVIDER_SITE_OTHER): Payer: Self-pay | Admitting: Family Medicine

## 2021-02-08 ENCOUNTER — Encounter (INDEPENDENT_AMBULATORY_CARE_PROVIDER_SITE_OTHER): Payer: Self-pay | Admitting: Family Medicine

## 2021-02-08 ENCOUNTER — Other Ambulatory Visit: Payer: Self-pay

## 2021-02-08 VITALS — BP 109/67 | HR 76 | Temp 98.2°F | Ht 62.0 in | Wt >= 6400 oz

## 2021-02-08 DIAGNOSIS — Z9189 Other specified personal risk factors, not elsewhere classified: Secondary | ICD-10-CM

## 2021-02-08 DIAGNOSIS — E559 Vitamin D deficiency, unspecified: Secondary | ICD-10-CM | POA: Diagnosis not present

## 2021-02-08 DIAGNOSIS — Z6841 Body Mass Index (BMI) 40.0 and over, adult: Secondary | ICD-10-CM

## 2021-02-08 DIAGNOSIS — R0602 Shortness of breath: Secondary | ICD-10-CM

## 2021-02-08 DIAGNOSIS — F418 Other specified anxiety disorders: Secondary | ICD-10-CM

## 2021-02-08 DIAGNOSIS — E1169 Type 2 diabetes mellitus with other specified complication: Secondary | ICD-10-CM

## 2021-02-08 MED ORDER — VITAMIN D (ERGOCALCIFEROL) 1.25 MG (50000 UNIT) PO CAPS
50000.0000 [IU] | ORAL_CAPSULE | ORAL | 0 refills | Status: DC
Start: 2021-02-08 — End: 2021-04-29

## 2021-02-08 MED ORDER — OZEMPIC (1 MG/DOSE) 4 MG/3ML ~~LOC~~ SOPN: 1.0000 mg | PEN_INJECTOR | SUBCUTANEOUS | 0 refills | Status: DC

## 2021-02-10 NOTE — Progress Notes (Signed)
Chief Complaint:   OBESITY Alexandra Beck is here to discuss her progress with her obesity treatment plan along with follow-up of her obesity related diagnoses. Alexandra Beck is on the Category 4 Plan and states she is following her eating plan approximately 0% of the time. Alexandra Beck states she is doing 0 minutes 0 times per week.  Today's visit was #: 30 Starting weight: 468 lbs Starting date: 04/15/2018 Today's weight: 441 lbs Today's date: 02/08/2021 Total lbs lost to date: 27 lbs Total lbs lost since last in-office visit: 0  Interim History: Pt had COVID after [facility] appointment in January. She is still occasionally experiencing congestion but doing better. She voices that she is struggling. She feels like she is sinking and is having a hard time. She must change up wherewithal to commit to plan.  Subjective:   1. Vitamin D deficiency Pt last Vit D level was 30.2 on 11/11/2020. She denies nausea, vomiting, and muscle weakness but notes fatigue. Pt is on prescription Vit D.   2. Type 2 diabetes mellitus with other specified complication, without long-term current use of insulin (HCC) Pt is on Ozempic 1 mg (doesn't feel much difference than at 0.5 mg). Her last A1c was 5.9 and insulin level 23.6.  Lab Results  Component Value Date   HGBA1C 5.9 (H) 11/11/2020   HGBA1C 5.6 12/17/2019   HGBA1C 5.6 07/04/2019   Lab Results  Component Value Date   MICROALBUR 8.05 (H) 06/26/2007   LDLCALC 103 (H) 11/11/2020   CREATININE 1.00 11/11/2020   Lab Results  Component Value Date   INSULIN 23.6 11/11/2020   INSULIN 17.1 12/17/2019   INSULIN 23.5 07/04/2019   INSULIN 25.8 (H) 12/04/2018   INSULIN 24.7 07/25/2018    3. Shortness of breath Pt's symptoms worsened with recent COVID infection. Her initial RMR was 2711.  4. Depression with anxiety Pt denies suicidal or homicidal ideations. She has a psych intake on March 27th. She has been off Trintellix since at least September 2021.  5. At risk  for depression Alexandra Beck is at elevated risk of depression due to one or more of the following: family history, significant life stressors, medical conditions and/or poor nutrition.  Assessment/Plan:   1. Vitamin D deficiency Low Vitamin D level contributes to fatigue and are associated with obesity, breast, and colon cancer. She agrees to continue to take prescription Vitamin D @50 ,000 IU every week and will follow-up for routine testing of Vitamin D, at least 2-3 times per year to avoid over-replacement.  - Vitamin D, Ergocalciferol, (DRISDOL) 1.25 MG (50000 UNIT) CAPS capsule; Take 1 capsule (50,000 Units total) by mouth every 7 (seven) days.  Dispense: 4 capsule; Refill: 0  2. Type 2 diabetes mellitus with other specified complication, without long-term current use of insulin (HCC) Good blood sugar control is important to decrease the likelihood of diabetic complications such as nephropathy, neuropathy, limb loss, blindness, coronary artery disease, and death. Intensive lifestyle modification including diet, exercise and weight loss are the first line of treatment for diabetes.   - Semaglutide, 1 MG/DOSE, (OZEMPIC, 1 MG/DOSE,) 4 MG/3ML SOPN; Inject 1 mg into the skin once a week.  Dispense: 3 mL; Refill: 0  3. Shortness of breath Alexandra Beck does feel that she gets out of breath more easily that she used to when she exercises. Alexandra Beck's shortness of breath appears to be obesity related and exercise induced. She has agreed to work on weight loss and gradually increase exercise to treat her exercise induced  shortness of breath. Will continue to monitor closely. IC today.  4. Depression with anxiety Behavior modification techniques were discussed today to help Alexandra Beck deal with her emotional/non-hunger eating behaviors.  Orders and follow up as documented in patient record. Follow up at next appointment.  5. At risk for depression Alexandra Beck was given approximately 15 minutes of depression risk counseling today.  She has risk factors for depression. We discussed the importance of a healthy work life balance, a healthy relationship with food and a good support system.  Repetitive spaced learning was employed today to elicit superior memory formation and behavioral change.  6. Class 3 severe obesity with serious comorbidity and body mass index (BMI) greater than or equal to 70 in adult, unspecified obesity type ([place]) Alexandra Beck is currently in the action stage of change. As such, her goal is to continue with weight loss efforts. She has agreed to practicing portion control and making smarter food choices, such as increasing vegetables and decreasing simple carbohydrates.   Exercise goals: As is  Behavioral modification strategies: increasing lean protein intake, meal planning and cooking strategies, keeping healthy foods in the home and planning for success.  Alexandra Beck has agreed to follow-up with our clinic in 4 weeks. She was informed of the importance of frequent follow-up visits to maximize her success with intensive lifestyle modifications for her multiple health conditions.   Objective:   Blood pressure 109/67, pulse 76, temperature 98.2 F (36.8 C), temperature source Oral, height 5\' 2"  (1.575 m), weight (!) 441 lb (200 kg), SpO2 93 %. Body mass index is 80.66 kg/m.  General: Cooperative, alert, well developed, in no acute distress. HEENT: Conjunctivae and lids unremarkable. Cardiovascular: Regular rhythm.  Lungs: Normal work of breathing. Neurologic: No focal deficits.   Lab Results  Component Value Date   CREATININE 1.00 11/11/2020   BUN 22 11/11/2020   NA 141 11/11/2020   K 4.4 11/11/2020   CL 101 11/11/2020   CO2 25 11/11/2020   Lab Results  Component Value Date   ALT 13 11/11/2020   AST 14 11/11/2020   ALKPHOS 82 11/11/2020   BILITOT 0.3 11/11/2020   Lab Results  Component Value Date   HGBA1C 5.9 (H) 11/11/2020   HGBA1C 5.6 12/17/2019   HGBA1C 5.6 07/04/2019   HGBA1C 5.4  12/04/2018   HGBA1C 5.8 (H) 07/25/2018   Lab Results  Component Value Date   INSULIN 23.6 11/11/2020   INSULIN 17.1 12/17/2019   INSULIN 23.5 07/04/2019   INSULIN 25.8 (H) 12/04/2018   INSULIN 24.7 07/25/2018   Lab Results  Component Value Date   TSH 2.94 01/08/2021   Lab Results  Component Value Date   CHOL 185 11/11/2020   HDL 53 11/11/2020   LDLCALC 103 (H) 11/11/2020   LDLDIRECT 141.0 03/02/2016   TRIG 169 (H) 11/11/2020   CHOLHDL 4 03/23/2018   Lab Results  Component Value Date   WBC 5.9 07/25/2018   HGB 14.8 07/25/2018   HCT 44.8 07/25/2018   MCV 88 07/25/2018   PLT 230.0 03/23/2018    Attestation Statements:   Reviewed by clinician on day of visit: allergies, medications, problem list, medical history, surgical history, family history, social history, and previous encounter notes.  Coral Ceo, am acting as transcriptionist for Coralie Common, MD.   I have reviewed the above documentation for accuracy and completeness, and I agree with the above. - Jinny Blossom, MD

## 2021-02-12 ENCOUNTER — Ambulatory Visit: Payer: Self-pay | Admitting: Adult Health

## 2021-02-17 ENCOUNTER — Ambulatory Visit (INDEPENDENT_AMBULATORY_CARE_PROVIDER_SITE_OTHER): Payer: BC Managed Care – PPO | Admitting: Psychology

## 2021-02-17 DIAGNOSIS — F331 Major depressive disorder, recurrent, moderate: Secondary | ICD-10-CM

## 2021-02-19 ENCOUNTER — Other Ambulatory Visit: Payer: Self-pay

## 2021-02-19 ENCOUNTER — Ambulatory Visit (INDEPENDENT_AMBULATORY_CARE_PROVIDER_SITE_OTHER): Payer: BC Managed Care – PPO | Admitting: Adult Health

## 2021-02-19 ENCOUNTER — Encounter: Payer: Self-pay | Admitting: Adult Health

## 2021-02-19 VITALS — BP 165/96 | HR 71 | Ht 63.0 in | Wt >= 6400 oz

## 2021-02-19 DIAGNOSIS — F331 Major depressive disorder, recurrent, moderate: Secondary | ICD-10-CM

## 2021-02-19 DIAGNOSIS — G4700 Insomnia, unspecified: Secondary | ICD-10-CM | POA: Diagnosis not present

## 2021-02-19 MED ORDER — BUPROPION HCL ER (XL) 150 MG PO TB24: ORAL_TABLET | ORAL | 2 refills | Status: DC

## 2021-02-19 NOTE — Progress Notes (Signed)
[facility] MD/PA/NP Initial Note  02/19/2021 1:58 PM Alexandra Beck  MRN:  [id number]  Chief Complaint:   HPI:   Describes mood today as "not too good". Tearful 3 to 5 days a week. Mood symptoms - reports depression and irritability - "usually has a lot of patience" - "getting frustrated and overwhelmed". Does not feel anxious - "no more than usual". Struggles to get in the shower and brush her teeth - "I can't even put toothpaste on my toothbrush". Not able to do anything around the house. Struggles to complete even 1 task a day. Seeing therapist - Bambi Cottle for therapy. Decreased interest and motivation. Would be willing to trial medication - trialed on Wellbutrin in teen years. Has most recently taken Trintellix and did not tolerate it.  Lower interest and motivation. Taking medications as prescribed.  Energy levels lower. Active, does not have a regular exercise routine.  Enjoys some usual interests and activities. Married. Lives with wife of 2 and 1/2 years - 2 cats. Spending time with family. Appetite decreased. Weight fluctuates by 20 pounds - 440 pounds. Working with a weight management clinic.  Sleeps well most nights. Averages 7 hours of broken sleep. Sleeps with oxygen and CPAP. Focus and concentration stable - "hard to invest in some activities. Completing tasks. Managing minimal aspects of household. Works 12 to 9 - at [employer].  Denies SI or HI.  Denies AH or VH. Seeing Bambi Cottle for therapy.  Previous medication trials: Trialed on Wellbutrin at age 80, Trintellix-weird dreams.  Visit Diagnosis:    ICD-10-CM   1. Major depressive disorder, recurrent episode, moderate (HCC)  F33.1 buPROPion (WELLBUTRIN XL) 150 MG 24 hr tablet  2. Insomnia, unspecified type  G47.00 buPROPion (WELLBUTRIN XL) 150 MG 24 hr tablet    Past Psychiatric History: Denies psychiatric hospitalization.  Past Medical History:  Past Medical History:  Diagnosis Date  . Acute renal failure  (ARF) ([place]) 11/2012    multifactorial-likely secondary to ATN in the setting of sepsis and hypotension, and also from rhabdomyolysis  . Anemia   . Cellulitis   . Chronic diastolic CHF (congestive heart failure) ([place])   . Depression   . Diabetes mellitus ([place])   . GERD (gastroesophageal reflux disease)   . Hyperlipidemia   . Hypertension   . Hypothyroidism   . Morbid obesity with BMI of 70 and over, adult ([place])   . Obesity hypoventilation syndrome ([place])   . OSA (obstructive sleep apnea)   . PVC's (premature ventricular contractions)   . Recurrent cellulitis of lower leg    LLE, venous insuff  . Sepsis ([place]) 11/2012   Secondary to cellulitis  . Sleep apnea   . Venous insufficiency of leg     Past Surgical History:  Procedure Laterality Date  . IR FLUORO GUIDE CV MIDLINE PICC RIGHT  03/28/2017  . IR US GUIDE VASC ACCESS RIGHT  03/28/2017  . WISDOM TOOTH EXTRACTION      Family Psychiatric History: Denies any family history of mental illness.  Family History:  Family History  Problem Relation Age of Onset  . Hypertension Mother   . Diabetes Mother   . High Cholesterol Mother   . Thyroid disease Mother   . Sleep apnea Mother   . Obesity Mother   . Hypertension Father   . Heart disease Father        before age 2  . High Cholesterol Father   . Sleep apnea Father   . Obesity  Father     Social History:  Social History   Socioeconomic History  . Marital status: Single    Spouse name: Not on file  . Number of children: 0  . Years of education: Not on file  . Highest education level: Not on file  Occupational History  . Occupation: Tree surgeon: [employer]  Tobacco Use  . Smoking status: Former Smoker    Years: 0.00  . Smokeless tobacco: Never Used  Vaping Use  . Vaping Use: Never used  Substance and Sexual Activity  . Alcohol use: No    Alcohol/week: 0.0 standard drinks  . Drug use: No  . Sexual activity: Not on file  Other Topics Concern  . 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 Aspirin Hives and Swelling    REACTION: throat swelling, hives  . Doxycycline Other (See Comments)    Abdominal pain  . Lisinopril Cough  . Niaspan [Niacin Er]     Caused flushing    Metabolic Disorder Labs: Lab Results  Component Value Date   HGBA1C 5.9 (H) 11/11/2020   MPG 143 03/28/2017   MPG 131 (H) 12/25/2012   No results found for: PROLACTIN Lab Results  Component Value Date   CHOL 185 11/11/2020   TRIG 169 (H) 11/11/2020   HDL 53 11/11/2020   CHOLHDL 4 03/23/2018   VLDL 29.6 03/23/2018   LDLCALC 103 (H) 11/11/2020   LDLCALC 106 (H) 12/17/2019   Lab Results  Component Value Date   TSH 2.94 01/08/2021   TSH 1.980 07/04/2019    Therapeutic Level Labs: No results found for: LITHIUM No results found for: VALPROATE No components found for:  CBMZ  Current Medications: Current Outpatient Medications  Medication Sig Dispense Refill  . buPROPion (WELLBUTRIN XL) 150 MG 24 hr tablet Take one tablet daily x 7 days, then take two capsules daily. 60 tablet 2  . amoxicillin (AMOXIL) 500 MG capsule TAKE 1 CAPSULE(500 MG) BY MOUTH TWICE DAILY 60 capsule 3  . levothyroxine (SYNTHROID) 150 MCG tablet TAKE 1 TABLET(150 MCG) BY MOUTH DAILY BEFORE BREAKFAST 90 tablet 0  . losartan (COZAAR) 100 MG tablet Take 1 tablet (100 mg total) by mouth daily. Please keep upcoming appt in March 2022 before anymore refills. Thank you 90 tablet 0  . metFORMIN (GLUCOPHAGE) 500 MG tablet TAKE 1 TABLET(500 MG) BY MOUTH DAILY 90 tablet 3  . metoprolol succinate (TOPROL-XL) 50 MG 24 hr tablet TAKE 1 TABLET BY MOUTH DAILY WITH OR  IMMEDIATELY FOLLOWING A MEAL 90 tablet 3  . modafinil (PROVIGIL) 100 MG tablet TAKE 1 TABLET BY MOUTH around 2 pm 30 tablet 5  . modafinil (PROVIGIL) 200 MG tablet Take 1 tablet (200 mg total) by mouth daily. Take 1 tab daily 9 am 30 tablet 3  . rosuvastatin (CRESTOR) 10 MG tablet TAKE 1 TABLET(10 MG) BY MOUTH DAILY 90 tablet 3  . Semaglutide, 1 MG/DOSE, (OZEMPIC, 1 MG/DOSE,) 4 MG/3ML SOPN Inject 1 mg into the skin once a week. 3 mL 0  . spironolactone (ALDACTONE) 25 MG tablet Take 1 tablet (25 mg total) by  mouth daily. Please make yearly appt with Dr. Meda Coffee for January 2022 for future refills. Thank you 1st attempt 30 tablet 0  . torsemide (DEMADEX) 20 MG tablet Take 2 tablets (40 mg total) by mouth daily. Please make yearly appt with Dr. Meda Coffee for January 2022 for future refills. 1st attempt 60 tablet 0  . traMADol (ULTRAM) 50 MG tablet Take 1-2 tablets (50-100 mg total) by mouth every 8 (eight) hours as needed. 20 tablet 0  . Vitamin D, Ergocalciferol, (DRISDOL) 1.25 MG (50000 UNIT) CAPS capsule Take 1 capsule (50,000 Units total) by mouth every 7 (seven) days. 4 capsule 0   No current facility-administered medications for this visit.    Medication Side Effects: none  Orders placed this visit:  No orders of the defined types were placed in this encounter.   Psychiatric Specialty Exam:  Review of Systems  Musculoskeletal: Negative for gait problem.  Neurological: Negative for tremors.  Psychiatric/Behavioral:       Please refer to HPI    There were no vitals taken for this visit.There is no height or weight on file to calculate BMI.  General Appearance: Casual and Neat  Eye Contact:  Good  Speech:  Clear and Coherent and Normal Rate  Volume:  Normal  Mood:  Depressed and Irritable  Affect:  Appropriate and Congruent  Thought Process:  Coherent and Descriptions of Associations: Loose  Orientation:  Negative  Thought Content: Logical   Suicidal Thoughts:  No  Homicidal  Thoughts:  No  Memory:  WNL  Judgement:  Good  Insight:  Good  Psychomotor Activity:  Normal  Concentration:  Concentration: Good  Recall:  Good  Fund of Knowledge: Good  Language: Good  Assets:  Communication Skills Desire for Improvement Financial Resources/Insurance Housing Intimacy Leisure Time Physical Health Resilience Social Support Talents/Skills Transportation Vocational/Educational  ADL's:  Intact  Cognition: WNL  Prognosis:  Good   Screenings:  PHQ2-9   [facility] Office Visit from 04/17/2018 in [facility] Office Visit from 02/27/2018 in [facility]ious Disease Office Visit from 10/11/2017 in [facility]  PHQ-2 Total Score 6 0 0  PHQ-9 Total Score 23 -- --      Receiving Psychotherapy: Yes   Treatment Plan/Recommendations:   Plan:   PDMP reviewed  1. Add Wellbutrin XL 150mg  x 7 days, then increase to 300mg . Denies seizure history.  Read and reviewed note with patient for accuracy.   RTC 4 weeks  Patient advised to contact office with any questions, adverse effects, or acute worsening in signs and symptoms.   Aloha Gell, NP

## 2021-02-24 ENCOUNTER — Other Ambulatory Visit: Payer: Self-pay | Admitting: Cardiology

## 2021-02-24 ENCOUNTER — Other Ambulatory Visit: Payer: Self-pay | Admitting: Pulmonary Disease

## 2021-02-24 ENCOUNTER — Ambulatory Visit (INDEPENDENT_AMBULATORY_CARE_PROVIDER_SITE_OTHER): Payer: BC Managed Care – PPO | Admitting: Psychology

## 2021-02-24 DIAGNOSIS — F331 Major depressive disorder, recurrent, moderate: Secondary | ICD-10-CM | POA: Diagnosis not present

## 2021-02-24 NOTE — Telephone Encounter (Signed)
Last OV 09/2018 Needs OV with APP/ me

## 2021-02-25 NOTE — Telephone Encounter (Signed)
Attempted to call pt but unable to reach. Left message for pt to return call so we can get her scheduled for a f/u appt.

## 2021-02-25 NOTE — Telephone Encounter (Signed)
Pt calling back. Pt has appt with BW on 4/8. Pt can be reached at [phone]

## 2021-02-25 NOTE — Telephone Encounter (Signed)
Called and spoke with patient who states she needs medication refilled. Patient did have a televisit with Beth NP back on 04/03/2020. Encouraged patient that she needs to keep her office visit with [facility] NP on 03/05/2021. Patient is asking if she can get coverage until appointment since she is out.  Dr Elsworth Soho please advise.

## 2021-02-26 MED ORDER — MODAFINIL 100 MG PO TABS: ORAL_TABLET | ORAL | 5 refills | Status: DC

## 2021-02-26 MED ORDER — MODAFINIL 200 MG PO TABS: 200.0000 mg | ORAL_TABLET | Freq: Every day | ORAL | 3 refills | Status: DC

## 2021-02-26 NOTE — Telephone Encounter (Signed)
Refill sent.

## 2021-03-03 ENCOUNTER — Ambulatory Visit (INDEPENDENT_AMBULATORY_CARE_PROVIDER_SITE_OTHER): Payer: BC Managed Care – PPO | Admitting: Psychology

## 2021-03-03 DIAGNOSIS — F331 Major depressive disorder, recurrent, moderate: Secondary | ICD-10-CM

## 2021-03-05 ENCOUNTER — Encounter: Payer: Self-pay | Admitting: Primary Care

## 2021-03-05 ENCOUNTER — Other Ambulatory Visit: Payer: Self-pay

## 2021-03-05 ENCOUNTER — Ambulatory Visit: Payer: BC Managed Care – PPO | Admitting: Primary Care

## 2021-03-05 DIAGNOSIS — G47419 Narcolepsy without cataplexy: Secondary | ICD-10-CM

## 2021-03-05 DIAGNOSIS — G4733 Obstructive sleep apnea (adult) (pediatric): Secondary | ICD-10-CM

## 2021-03-05 NOTE — Progress Notes (Signed)
@Patient  ID: Alexandra Beck, female    DOB: 1981-10-14, 40 y.o.   MRN: [id number]  Chief Complaint  Patient presents with  . Follow-up    CPAP working well.     Referring provider: Marrian Salvage,*  HPI: 40 year old female, former smoker. PMH significant for OSA, obesity hypoventilation syndrome, narcolepsy without cataplexy, delayed sleep phase, chronic diastolic heart failure. Patient of Dr. Elsworth Soho. NPSG 2009: AHI 93/hr. CPAP titration 05/2018 >> CPAP 15 + 3L O2 (434 lbs ). Emphasized importance of sleep routine and scheduled bed/wake time. Enc bedtime around 1-2 AM. Recommended light exposure in the morning. Maintained on Provigil 100mg  three times daily. Continues CPAP at 15cm H20.   Previous [facility] pulmonary encounters: 06/06/2019 Patient contacted today for video visit for OSA follow-up. She is doing well. Compliant with CPAP, wearing every night. No issues with pressure setting, does lose mask seal. States that she is not the best at replacing CPAP parts. Reports benefit from Provigil medication.  Typically works 1pm-10pm but since [place] pandemic she has been working 10am-7pm. She still goes to bed around 1:30-2am and wakes up around 9-9:30am. She still has a hard time getting up in the moring but medication allows her to stay awake. Takes 300mg  at once in the morning. She can feel a difference if she forgets to take, states that she is ready to fall asleep. [facility]- has an apt next Thursday.   [employer] download: 30/30 day used; 93% >4 hours Average usage 6 hours 46 mins Pressure 15cm h20 Leaks 79.9 L/min (95%) AHI 6.1  04/03/2020 Patient contacted today for annual follow-up. She just replaced mask, uses full face mask. The velco on her headgear will loosen. She has some airleaks. She is sleeping ok at night. No trouble falling or staying asleep. She has been trying to go to bed earlier. She works as a [place] from home current on [employer]. She  gets off of work at [employer] to get to bed before 1am and wakes up around 9am. She continues to take modafinil 300mg  in the morning. She has been on CPAP for approx 10 years. She continues to work on weight loss. She has lost approx 50-60 lbs with weight loss clinic.   [employer] download: 30/30 days used (100%); 29 days > 4 hours. Average usage 7 hours 26 mins. Pressure 15cm h20. Air leaks 44.8L/min (95%). AHI 4.1  03/05/2021 - interim hx  Patient presents today for 1 year follow-up. No issues with mask fit or pressure 15cm. She has been using her CPAP every night with 2L oxygen. Sleeping well, she gets on average 7 hours a night. Reports mouth dryness, states that her mouth comes open at night. She sleeps on her side. She uses a full face mask. She can only get elastic headgear every 6 months from her DME company.  She works from [employer] at [employer]. She reports noticeable daytime sleepiness if she does not take her modafanil medication. Epworth sleepiness scale 4. DME is [facility], patient needs to be re-enrolled in [employer]- they will email [place] a copy of her compliance report.    Allergies  Allergen Reactions  . Aspirin Hives and Swelling    REACTION: throat swelling, hives  . Doxycycline Other (See Comments)    Abdominal pain  . Lisinopril Cough  . Niaspan [Niacin Er]     Caused flushing    Immunization History  Administered Date(s) Administered  . Influenza,inj,Quad PF,6+ Mos 08/02/2013, 08/28/2016,  10/11/2017, 07/23/2019  . PFIZER(Purple Top)SARS-COV-2 Vaccination 02/06/2020, 02/27/2020  . Pneumococcal Polysaccharide-23 12/28/2012  . Tdap 08/26/2015    Past Medical History:  Diagnosis Date  . Acute renal failure (ARF) ([place]) 11/2012    multifactorial-likely secondary to ATN in the setting of sepsis and hypotension, and also from rhabdomyolysis  . Anemia   . Cellulitis   . Chronic diastolic CHF (congestive heart failure) ([place])   . Depression   . Diabetes mellitus ([place])   . GERD  (gastroesophageal reflux disease)   . Hyperlipidemia   . Hypertension   . Hypothyroidism   . Morbid obesity with BMI of 70 and over, adult ([place])   . Obesity hypoventilation syndrome ([place])   . OSA (obstructive sleep apnea)   . PVC's (premature ventricular contractions)   . Recurrent cellulitis of lower leg    LLE, venous insuff  . Sepsis ([place]) 11/2012   Secondary to cellulitis  . Sleep apnea   . Venous insufficiency of leg     Tobacco History: Social History   Tobacco Use  Smoking Status Former Smoker  . Packs/day: 0.50  . Years: 8.00  . Pack years: 4.00  . Quit date: 2011  . Years since quitting: 11.2  Smokeless Tobacco Never Used   Counseling given: Not Answered   Outpatient Medications Prior to Visit  Medication Sig Dispense Refill  . buPROPion (WELLBUTRIN XL) 150 MG 24 hr tablet Take one tablet daily x 7 days, then take two capsules daily. 60 tablet 2  . levothyroxine (SYNTHROID) 150 MCG tablet TAKE 1 TABLET(150 MCG) BY MOUTH DAILY BEFORE BREAKFAST 90 tablet 0  . losartan (COZAAR) 100 MG tablet Take 1 tablet (100 mg total) by mouth daily. Please keep upcoming appt in March 2022 before anymore refills. Thank you 90 tablet 0  . metFORMIN (GLUCOPHAGE) 500 MG tablet TAKE 1 TABLET(500 MG) BY MOUTH DAILY 90 tablet 3  . metoprolol succinate (TOPROL-XL) 50 MG 24 hr tablet TAKE 1 TABLET BY MOUTH DAILY WITH OR IMMEDIATELY FOLLOWING A MEAL 90 tablet 3  . modafinil (PROVIGIL) 100 MG tablet TAKE 1 TABLET BY MOUTH around 2 pm 30 tablet 5  . modafinil (PROVIGIL) 200 MG tablet Take 1 tablet (200 mg total) by mouth daily. Take 1 tab daily 9 am 30 tablet 3  . rosuvastatin (CRESTOR) 10 MG tablet TAKE 1 TABLET(10 MG) BY MOUTH DAILY 90 tablet 3  . Semaglutide, 1 MG/DOSE, (OZEMPIC, 1 MG/DOSE,) 4 MG/3ML SOPN Inject 1 mg into the skin once a week. 3 mL 0  . spironolactone (ALDACTONE) 25 MG tablet Take 1 tablet (25 mg total) by mouth daily. Pt must make appt with provider before more refills - 2nd  attempt 15 tablet 0  . torsemide (DEMADEX) 20 MG tablet Take 2 tablets (40 mg total) by mouth daily. Pt must make appt with provider before more refills - 2nd attempt 30 tablet 0  . traMADol (ULTRAM) 50 MG tablet Take 1-2 tablets (50-100 mg total) by mouth every 8 (eight) hours as needed. 20 tablet 0  . Vitamin D, Ergocalciferol, (DRISDOL) 1.25 MG (50000 UNIT) CAPS capsule Take 1 capsule (50,000 Units total) by mouth every 7 (seven) days. 4 capsule 0  . amoxicillin (AMOXIL) 500 MG capsule TAKE 1 CAPSULE(500 MG) BY MOUTH TWICE DAILY 60 capsule 3   No facility-administered medications prior to visit.    Review of Systems  Review of Systems  Constitutional: Positive for fatigue.  Respiratory: Negative.   Psychiatric/Behavioral: Negative for sleep disturbance.  Physical Exam  BP 138/75 (BP Location: Left Arm, Patient Position: Sitting, Cuff Size: Large)   Pulse 88   Temp (!) 97.4 F (36.3 C)   Ht 5\' 3"  (1.6 m)   Wt (!) 437 lb (198.2 kg)   SpO2 96%   BMI 77.41 kg/m  Physical Exam Constitutional:      General: She is not in acute distress.    Appearance: Normal appearance. She is obese. She is not ill-appearing.  HENT:     Head: Normocephalic and atraumatic.     Mouth/Throat:     Comments: Deferred d/t masking Cardiovascular:     Rate and Rhythm: Normal rate and regular rhythm.  Pulmonary:     Effort: Pulmonary effort is normal.     Breath sounds: Normal breath sounds.     Comments: CTA Skin:    General: Skin is warm and dry.  Neurological:     General: No focal deficit present.     Mental Status: She is alert and oriented to person, place, and time. Mental status is at baseline.  Psychiatric:        Mood and Affect: Mood normal.        Behavior: Behavior normal.        Thought Content: Thought content normal.        Judgment: Judgment normal.      Lab Results:  CBC    Component Value Date/Time   WBC 5.9 07/25/2018 1234   WBC 7.9 03/23/2018 1224   RBC 5.07  07/25/2018 1234   RBC 5.65 (H) 03/23/2018 1224   HGB 14.8 07/25/2018 1234   HCT 44.8 07/25/2018 1234   PLT 230.0 03/23/2018 1224   MCV 88 07/25/2018 1234   MCH 29.2 07/25/2018 1234   MCH 26.8 04/19/2017 0419   MCHC 33.0 07/25/2018 1234   MCHC 31.6 03/23/2018 1224   RDW 15.6 (H) 07/25/2018 1234   LYMPHSABS 1.4 07/25/2018 1234   MONOABS 0.7 04/16/2017 2351   EOSABS 0.2 07/25/2018 1234   BASOSABS 0.0 07/25/2018 1234    BMET    Component Value Date/Time   NA 141 11/11/2020 1211   K 4.4 11/11/2020 1211   CL 101 11/11/2020 1211   CO2 25 11/11/2020 1211   GLUCOSE 107 (H) 11/11/2020 1211   GLUCOSE 117 (H) 03/23/2018 1224   BUN 22 11/11/2020 1211   CREATININE 1.00 11/11/2020 1211   CREATININE 1.06 11/14/2016 1050   CALCIUM 9.9 11/11/2020 1211   CALCIUM 9.6 06/26/2007 0000   GFRNONAA 71 11/11/2020 1211   GFRAA 81 11/11/2020 1211    BNP No results found for: BNP  ProBNP    Component Value Date/Time   PROBNP 145 (H) 03/26/2020 1602   PROBNP 641.0 (H) 08/30/2016 1413    Imaging: No results found.   Assessment & Plan:   OSA (obstructive sleep apnea) - Patient reports compliance with CPAP and nocturnal oxygen 2L/min. No download available. No significant issues with mask fit or pressure setting. Reports dry mouth. She wears a full face mask and has humidification. DME is [facility], patient needs to be re-enrolled in [facility]- they will email [place] a copy of her compliance report. Advised patient not drive if experiencing excessive daytime fatigue/somnolence. FU in 1 Year with Dr. Elsworth Soho or [facility] NP.   Narcolepsy without cataplexy - Daytime sleepiness well controlled on current treatment. Epworth 4/24 - Continue Provigil 200mg  at 9am and 100mg  at 2pm  - PMP reviewed, no unexpected prescriptions except for one time Rx tramadol. -  Encourage regular sleep routine, daily exercise and light exposure     Martyn Ehrich, NP 03/05/2021

## 2021-03-05 NOTE — Assessment & Plan Note (Addendum)
-   Daytime sleepiness well controlled on current treatment. Epworth 4/24 - Continue Provigil 200mg  at 9am and 100mg  at 2pm  - PMP reviewed, no unexpected prescriptions except for one time Rx tramadol. - Encourage regular sleep routine, daily exercise and light exposure

## 2021-03-05 NOTE — Patient Instructions (Addendum)
Recommendations: - Continue to wear CPAP every night for minimum 4-6 hours or longer - Do not drive if experiencing excessive daytime or fatigue - Dr. Elsworth Soho sent refills and for modafinil 200 mg with 3 refills and modafinil 100 mg with 6 refills very first.  Please notify our office when you need future refills. -[facility] will email [place] a compliance report from your CPAP, I will notify you if we need to change any of your settings  Follow-up -1 year follow-up with Dr. Elsworth Soho or [facility] NP   Living With Sleep Apnea Sleep apnea is a condition in which breathing pauses or becomes shallow during sleep. Sleep apnea is most commonly caused by a collapsed or blocked airway. People with sleep apnea snore loudly and have times when they gasp and stop breathing for 10 seconds or more during sleep. This happens over and over during the night. This disrupts your sleep and keeps your body from getting the rest that it needs, which can cause tiredness and lack of energy (fatigue) during the day. The breaks in breathing also interrupt the deep sleep that you need to feel rested. Even if you do not completely wake up from the gaps in breathing, your sleep may not be restful. You may also have a headache in the morning and low energy during the day, and you may feel anxious or depressed. How can sleep apnea affect me? Sleep apnea increases your chances of extreme tiredness during the day (daytime fatigue). It can also increase your risk for health conditions, such as:  Heart attack.  Stroke.  Diabetes.  Heart failure.  Irregular heartbeat.  High blood pressure. If you have daytime fatigue as a result of sleep apnea, you may be more likely to:  Perform poorly at school or work.  Fall asleep while driving.  Have difficulty with attention.  Develop depression or anxiety.  Become severely overweight (obese).  Have sexual dysfunction. What actions can I take to manage sleep apnea? Sleep apnea  treatment  If you were given a device to open your airway while you sleep, use it only as told by your health care provider. You may be given: ? An oral appliance. This is a custom-made mouthpiece that shifts your lower jaw forward. ? A continuous positive airway pressure (CPAP) device. This device blows air through a mask when you breathe out (exhale). ? A nasal expiratory positive airway pressure (EPAP) device. This device has valves that you put into each nostril. ? A bi-level positive airway pressure (BPAP) device. This device blows air through a mask when you breathe in (inhale) and breathe out (exhale).  You may need surgery if other treatments do not work for you.   Sleep habits  Go to sleep and wake up at the same time every day. This helps set your internal clock (circadian rhythm) for sleeping. ? If you stay up later than usual, such as on weekends, try to get up in the morning within 2 hours of your normal wake time.  Try to get at least 7-9 hours of sleep each night.  Stop computer, tablet, and mobile phone use a few hours before bedtime.  Do not take long naps during the day. If you nap, limit it to 30 minutes.  Have a relaxing bedtime routine. Reading or listening to music may relax you and help you sleep.  Use your bedroom only for sleep. ? Keep your television and computer out of your bedroom. ? Keep your bedroom cool, dark, and  quiet. ? Use a supportive mattress and pillows.  Follow your health care provider's instructions for other changes to sleep habits. Nutrition  Do not eat heavy meals in the evening.  Do not have caffeine in the later part of the day. The effects of caffeine can last for more than 5 hours.  Follow your health care provider's or dietitian's instructions for any diet changes. Lifestyle  Do not drink alcohol before bedtime. Alcohol can cause you to fall asleep at first, but then it can cause you to wake up in the middle of the night and have  trouble getting back to sleep.  Do not use any products that contain nicotine or tobacco, such as cigarettes and e-cigarettes. If you need help quitting, ask your health care provider.      Medicines  Take over-the-counter and prescription medicines only as told by your health care provider.  Do not use over-the-counter sleep medicine. You can become dependent on this medicine, and it can make sleep apnea worse.  Do not use medicines, such as sedatives and narcotics, unless told by your health care provider. Activity  Exercise on most days, but avoid exercising in the evening. Exercising near bedtime can interfere with sleeping.  If possible, spend time outside every day. Natural light helps regulate your circadian rhythm. General information  Lose weight if you need to, and maintain a healthy weight.  Keep all follow-up visits as told by your health care provider. This is important.  If you are having surgery, make sure to tell your health care provider that you have sleep apnea. You may need to bring your device with you. Where to find more information Learn more about sleep apnea and daytime fatigue from:  American Sleep Association: sleepassociation.[place]: sleepfoundation.org  National Heart, Lung, and Blood Institute: https://www.[place].biz/ Summary  Sleep apnea can cause daytime fatigue and other serious health conditions.  Both sleep apnea and daytime fatigue can be bad for your health and well-being.  You may need to wear a device while sleeping to help keep your airway open.  If you are having surgery, make sure to tell your health care provider that you have sleep apnea. You may need to bring your device with you.  Making changes to sleep habits, diet, lifestyle, and activity can help you manage sleep apnea. This information is not intended to replace advice given to you by your health care provider. Make sure you discuss any questions you have with  your health care provider. Document Revised: 03/08/2019 Document Reviewed: 02/08/2018 Elsevier Patient Education  2021 [place].   CPAP and BPAP Information CPAP and BPAP are methods that use air pressure to keep your airways open and to help you breathe well. CPAP and BPAP use different amounts of pressure. Your health care provider will tell you whether CPAP or BPAP would be more helpful for you.  CPAP stands for "continuous positive airway pressure." With CPAP, the amount of pressure stays the same while you breathe in and out.  BPAP stands for "bi-level positive airway pressure." With BPAP, the amount of pressure will be higher when you breathe in (inhale) and lower when you breathe out(exhale). This allows you to take larger breaths. CPAP or BPAP may be used in the hospital, or your health care provider may want you to use it at home. You may need to have a sleep study before your health care provider can order a machine for you to use at home. Why  are CPAP and BPAP treatments used? CPAP or BPAP can be helpful if you have:  Sleep apnea.  Chronic obstructive pulmonary disease (COPD).  Heart failure.  Medical conditions that cause muscle weakness, including muscular dystrophy or amyotrophic lateral sclerosis (ALS).  Other problems that cause breathing to be shallow, weak, abnormal, or difficult. CPAP and BPAP are most commonly used for obstructive sleep apnea (OSA) to keep the airways from collapsing when the muscles relax during sleep. How is CPAP or BPAP administered? Both CPAP and BPAP are provided by a small machine with a flexible plastic tube that attaches to a plastic mask that you wear. Air is blown through the mask into your nose or mouth. The amount of pressure that is used to blow the air can be adjusted on the machine. Your health care provider will set the pressure setting and help you find the best mask for you. When should CPAP or BPAP be used? In most cases, the  mask only needs to be worn during sleep. Generally, the mask needs to be worn throughout the night and during any daytime naps. People with certain medical conditions may also need to wear the mask at other times when they are awake. Follow instructions from your health care provider about when to use the machine. What are some tips for using the mask?  Because the mask needs to be snug, some people feel trapped or closed-in (claustrophobic) when first using the mask. If you feel this way, you may need to get used to the mask. One way to do this is to hold the mask loosely over your nose or mouth and then gradually apply the mask more snugly. You can also gradually increase the amount of time that you use the mask.  Masks are available in various types and sizes. If your mask does not fit well, talk with your health care provider about getting a different one. Some common types of masks include: ? Full face masks, which fit over the mouth and nose. ? Nasal masks, which fit over the nose. ? Nasal pillow or prong masks, which fit into the nostrils.  If you are using a mask that fits over your nose and you tend to breathe through your mouth, a chin strap may be applied to help keep your mouth closed.  Some CPAP and BPAP machines have alarms that may sound if the mask comes off or develops a leak.  If you have trouble with the mask, it is very important that you talk with your health care provider about finding a way to make the mask easier to tolerate. Do not stop using the mask. There could be a negative impact to your health if you stop using the mask.   What are some tips for using the machine?  Place your CPAP or BPAP machine on a secure table or stand near an electrical outlet.  Know where the on/off switch is on the machine.  Follow instructions from your health care provider about how to set the pressure on your machine and when you should use it.  Do not eat or drink while the CPAP or  BPAP machine is on. Food or fluids could get pushed into your lungs by the pressure of the CPAP or BPAP.  For home use, CPAP and BPAP machines can be rented or purchased through home health care companies. Many different brands of machines are available. Renting a machine before purchasing may help you find out which particular machine works  well for you. Your insurance may also decide which machine you may get.  Keep the CPAP or BPAP machine and attachments clean. Ask your health care provider for specific instructions. Follow these instructions at home:  Do not use any products that contain nicotine or tobacco, such as cigarettes, e-cigarettes, and chewing tobacco. If you need help quitting, ask your health care provider.  Keep all follow-up visits as told by your health care provider. This is important. Contact a health care provider if:  You have redness or pressure sores on your head, face, mouth, or nose from the mask or head gear.  You have trouble using the CPAP or BPAP machine.  You cannot tolerate wearing the CPAP or BPAP mask.  Someone tells you that you snore even when wearing your CPAP or BPAP. Get help right away if:  You have trouble breathing.  You feel confused. Summary  CPAP and BPAP are methods that use air pressure to keep your airways open and to help you breathe well.  You may need to have a sleep study before your health care provider can order a machine for home use.  If you have trouble with the mask, it is very important that you talk with your health care provider about finding a way to make the mask easier to tolerate. Do not stop using the mask. There could be a negative impact to your health if you stop using the mask.  Follow instructions from your health care provider about when to use the machine. This information is not intended to replace advice given to you by your health care provider. Make sure you discuss any questions you have with your health  care provider. Document Revised: 12/06/2019 Document Reviewed: 12/09/2019 Elsevier Patient Education  2021 [employer].

## 2021-03-05 NOTE — Assessment & Plan Note (Addendum)
-   Patient reports compliance with CPAP and nocturnal oxygen 2L/min. No download available. No significant issues with mask fit or pressure setting. Reports dry mouth. She wears a full face mask and has humidification. DME is [facility], patient needs to be re-enrolled in [employer]- they will email [place] a copy of her compliance report. Advised patient not drive if experiencing excessive daytime fatigue/somnolence. FU in 1 Year with Dr. Elsworth Soho or [facility] NP.

## 2021-03-08 ENCOUNTER — Other Ambulatory Visit: Payer: Self-pay

## 2021-03-08 ENCOUNTER — Encounter (INDEPENDENT_AMBULATORY_CARE_PROVIDER_SITE_OTHER): Payer: Self-pay | Admitting: Family Medicine

## 2021-03-08 ENCOUNTER — Ambulatory Visit (INDEPENDENT_AMBULATORY_CARE_PROVIDER_SITE_OTHER): Payer: BC Managed Care – PPO | Admitting: Family Medicine

## 2021-03-08 VITALS — BP 120/78 | HR 77 | Temp 98.6°F | Ht 63.0 in | Wt >= 6400 oz

## 2021-03-08 DIAGNOSIS — F418 Other specified anxiety disorders: Secondary | ICD-10-CM

## 2021-03-08 DIAGNOSIS — Z6841 Body Mass Index (BMI) 40.0 and over, adult: Secondary | ICD-10-CM

## 2021-03-08 DIAGNOSIS — Z9189 Other specified personal risk factors, not elsewhere classified: Secondary | ICD-10-CM

## 2021-03-08 DIAGNOSIS — E1169 Type 2 diabetes mellitus with other specified complication: Secondary | ICD-10-CM | POA: Diagnosis not present

## 2021-03-08 MED ORDER — OZEMPIC (0.25 OR 0.5 MG/DOSE) 2 MG/1.5ML ~~LOC~~ SOPN: 0.5000 mg | PEN_INJECTOR | SUBCUTANEOUS | 0 refills | Status: DC

## 2021-03-10 ENCOUNTER — Ambulatory Visit (INDEPENDENT_AMBULATORY_CARE_PROVIDER_SITE_OTHER): Payer: BC Managed Care – PPO | Admitting: Psychology

## 2021-03-10 DIAGNOSIS — F331 Major depressive disorder, recurrent, moderate: Secondary | ICD-10-CM

## 2021-03-11 NOTE — Progress Notes (Signed)
Chief Complaint:   OBESITY Alexandra Beck is here to discuss her progress with her obesity treatment plan along with follow-up of her obesity related diagnoses. Alexandra Beck is on practicing portion control and making smarter food choices, such as increasing vegetables and decreasing simple carbohydrates and states she is following her eating plan approximately 50% of the time. Alexandra Beck states she is not currently exercising.  Today's visit was #: 22 Starting weight: 468 lbs Starting date: 04/15/2018 Today's weight: 432 lbs Today's date: 03/08/2021 Total lbs lost to date: 36 lbs Total lbs lost since last in-office visit: 9  Interim History: Alexandra Beck has started seeing provider at [facility] and was started on Wellbutrin. She is experiencing hunger but with no appetite, so she is forcing herself to eat. She is still struggling with mood and seeing an NP for management. She isn't even able to eat entire sandwich if she tries.  Subjective:   1. Type 2 diabetes mellitus with other specified complication, without long-term current use of insulin (HCC) Alexandra Beck is on Ozempic 1 mg SubQ weekly. She denies GI side effects except significant decline in appetite.  2. Depression with anxiety Alexandra Beck denies suicidal or homicidal ideations. She reports no improvement with addition of Wellbutrin 2 weeks ago. Therapist has brought up the possibility of EMDR at a later time. Her next appointment with NP at [place] is 03/26/2021 and therapy in 2 days.   3. At risk for deficient intake of food Alexandra Beck is at risk for deficient intake of food.  Assessment/Plan:   1. Type 2 diabetes mellitus with other specified complication, without long-term current use of insulin (HCC) Good blood sugar control is important to decrease the likelihood of diabetic complications such as nephropathy, neuropathy, limb loss, blindness, coronary artery disease, and death. Intensive lifestyle modification including diet, exercise and weight loss are  the first line of treatment for diabetes. Decrease Ozempic to 0.5 mg, as prescribed below.  - Semaglutide,0.25 or 0.5MG /DOS, (OZEMPIC, 0.25 OR 0.5 MG/DOSE,) 2 MG/1.5ML SOPN; Inject 0.5 mg into the skin once a week.  Dispense: 1.5 mL; Refill: 0  2. Depression with anxiety Behavior modification techniques were discussed today to help Alexandra Beck deal with her emotional/non-hunger eating behaviors.  Orders and follow up as documented in patient record. Follow up with above providers; pt is to reach out on MyChart if she needs anything in interim.  3. At risk for deficient intake of food Alexandra Beck was given approximately 15 minutes of deficit intake of food prevention counseling today. Alexandra Beck is at risk for eating too few calories based on current food recall. She was encouraged to focus on meeting caloric and protein goals according to her recommended meal plan.   4. Class 3 severe obesity with serious comorbidity and body mass index (BMI) greater than or equal to 70 in adult, unspecified obesity type ([place]) Alexandra Beck is currently in the action stage of change. As such, her goal is to continue with weight loss efforts. She has agreed to practicing portion control and making smarter food choices, such as increasing vegetables and decreasing simple carbohydrates.   Exercise goals: No exercise has been prescribed at this time.  Behavioral modification strategies: increasing lean protein intake, meal planning and cooking strategies and keeping healthy foods in the home.  Alexandra Beck has agreed to follow-up with our clinic in 4-6 weeks. She was informed of the importance of frequent follow-up visits to maximize her success with intensive lifestyle modifications for her multiple health conditions.   Objective:  Blood pressure 120/78, pulse 77, temperature 98.6 F (37 C), height 5\' 3"  (1.6 m), weight (!) 432 lb (196 kg), SpO2 96 %. Body mass index is 76.53 kg/m.  General: Cooperative, alert, well developed, in no acute  distress. HEENT: Conjunctivae and lids unremarkable. Cardiovascular: Regular rhythm.  Lungs: Normal work of breathing. Neurologic: No focal deficits.   Lab Results  Component Value Date   CREATININE 1.00 11/11/2020   BUN 22 11/11/2020   NA 141 11/11/2020   K 4.4 11/11/2020   CL 101 11/11/2020   CO2 25 11/11/2020   Lab Results  Component Value Date   ALT 13 11/11/2020   AST 14 11/11/2020   ALKPHOS 82 11/11/2020   BILITOT 0.3 11/11/2020   Lab Results  Component Value Date   HGBA1C 5.9 (H) 11/11/2020   HGBA1C 5.6 12/17/2019   HGBA1C 5.6 07/04/2019   HGBA1C 5.4 12/04/2018   HGBA1C 5.8 (H) 07/25/2018   Lab Results  Component Value Date   INSULIN 23.6 11/11/2020   INSULIN 17.1 12/17/2019   INSULIN 23.5 07/04/2019   INSULIN 25.8 (H) 12/04/2018   INSULIN 24.7 07/25/2018   Lab Results  Component Value Date   TSH 2.94 01/08/2021   Lab Results  Component Value Date   CHOL 185 11/11/2020   HDL 53 11/11/2020   LDLCALC 103 (H) 11/11/2020   LDLDIRECT 141.0 03/02/2016   TRIG 169 (H) 11/11/2020   CHOLHDL 4 03/23/2018   Lab Results  Component Value Date   WBC 5.9 07/25/2018   HGB 14.8 07/25/2018   HCT 44.8 07/25/2018   MCV 88 07/25/2018   PLT 230.0 03/23/2018    Attestation Statements:   Reviewed by clinician on day of visit: allergies, medications, problem list, medical history, surgical history, family history, social history, and previous encounter notes.  Coral Ceo, am acting as transcriptionist for Coralie Common, MD.   I have reviewed the above documentation for accuracy and completeness, and I agree with the above. - Jinny Blossom, MD

## 2021-03-15 ENCOUNTER — Encounter (INDEPENDENT_AMBULATORY_CARE_PROVIDER_SITE_OTHER): Payer: Self-pay

## 2021-03-18 ENCOUNTER — Other Ambulatory Visit: Payer: Self-pay | Admitting: Cardiology

## 2021-03-22 ENCOUNTER — Other Ambulatory Visit: Payer: Self-pay

## 2021-03-22 MED ORDER — SPIRONOLACTONE 25 MG PO TABS: 25.0000 mg | ORAL_TABLET | Freq: Every day | ORAL | 0 refills | Status: DC

## 2021-03-22 MED ORDER — TORSEMIDE 20 MG PO TABS: 40.0000 mg | ORAL_TABLET | Freq: Every day | ORAL | 0 refills | Status: DC

## 2021-03-24 ENCOUNTER — Ambulatory Visit: Payer: BC Managed Care – PPO | Admitting: Psychology

## 2021-03-26 ENCOUNTER — Ambulatory Visit (INDEPENDENT_AMBULATORY_CARE_PROVIDER_SITE_OTHER): Payer: BC Managed Care – PPO | Admitting: Adult Health

## 2021-03-26 ENCOUNTER — Encounter: Payer: Self-pay | Admitting: Adult Health

## 2021-03-26 ENCOUNTER — Other Ambulatory Visit: Payer: Self-pay

## 2021-03-26 DIAGNOSIS — F428 Other obsessive-compulsive disorder: Secondary | ICD-10-CM | POA: Diagnosis not present

## 2021-03-26 DIAGNOSIS — G4700 Insomnia, unspecified: Secondary | ICD-10-CM | POA: Diagnosis not present

## 2021-03-26 DIAGNOSIS — F331 Major depressive disorder, recurrent, moderate: Secondary | ICD-10-CM | POA: Diagnosis not present

## 2021-03-26 MED ORDER — SERTRALINE HCL 50 MG PO TABS: 50.0000 mg | ORAL_TABLET | Freq: Every day | ORAL | 5 refills | Status: DC

## 2021-03-26 MED ORDER — BUPROPION HCL ER (XL) 300 MG PO TB24: 300.0000 mg | ORAL_TABLET | Freq: Every day | ORAL | 5 refills | Status: DC

## 2021-03-26 NOTE — Progress Notes (Signed)
Alexandra Beck [id number] May 06, 1981 40 y.o.  Subjective:   Patient ID:  Alexandra Beck is a 40 y.o. (DOB 1981/02/26) female.  Chief Complaint: No chief complaint on file.   HPI Alexandra Beck presents to the office today for follow-up of insomnia, obsessional thoughts, and depression.  Describes mood today as "not much better". Tearful. Mood symptoms - reports depression and irritability. Denies increased anxiety. Still has no desire to do anything. Reports worrying and ruminating. Reports reflecting on things so much she can't act or talk or have an emotion without beating herself to death over it. Can send an email to someone and they say ok and she interprets it as what's wrong. Feels like she reads into things that are not an issues. Dissects and scrutinizing things. What are other people thinking or saying a bout what she does. Putting words into other people's mouth. Anticipating what others may read into things. Has a core belief that she is unwanted and looks for things to validate it. Seeking negative reinforcement. Continues to struggle with showering and brushing her teeth. Seeing therapist - Bambi Cottle for therapy. Decreased interest and motivation. Would be willing to trial medication - trialed on Wellbutrin in teen years. Has most recently taken Trintellix and did not tolerate it.  Lower interest and motivation. Taking medications as prescribed.  Energy levels remain lower. Active, does not have a regular exercise routine.  Enjoys some usual interests and activities. Married. Lives with wife of 2 and 1/2 years - 2 cats. Spending time with family. Appetite decreased. Weight fluctuates by 20 pounds - 440 pounds. Working with a weight management clinic.  Sleeps better some nights than others - sleeping through the night most nights. Averages 7 hours of broken sleep. Sleeps with oxygen and CPAP. Focus and concentration stable. Completing tasks. Managing minimal aspects of household. Works  12 to 9 - at [employer].  Denies SI or HI.  Denies AH or VH. Seeing Bambi Cottle for therapy.  Previous medication trials: Trialed on Wellbutrin at age 40, Trintellix-weird dreams.    [place] Office Visit from 04/17/2018 in [place] Office Visit from 02/27/2018 in [facility] Office Visit from 10/11/2017 in [facility]  PHQ-2 Total Score 6 0 0  PHQ-9 Total Score 23 -- --       Review of Systems:  Review of Systems  Musculoskeletal: Negative for gait problem.  Neurological: Negative for tremors.  Psychiatric/Behavioral:       Please refer to HPI    Medications: I have reviewed the patient's current medications.  Current Outpatient Medications  Medication Sig Dispense Refill  . buPROPion (WELLBUTRIN XL) 300 MG 24 hr tablet Take 1 tablet (300 mg total) by mouth daily. 30 tablet 5  . sertraline (ZOLOFT) 50 MG tablet Take 1 tablet (50 mg total) by mouth daily. 30 tablet 5  . buPROPion (WELLBUTRIN XL) 150 MG 24 hr tablet Take one tablet daily x 7 days, then take two capsules daily. 60 tablet 2  . levothyroxine (SYNTHROID) 150 MCG tablet TAKE 1 TABLET(150 MCG) BY MOUTH DAILY BEFORE BREAKFAST 90 tablet 0  . losartan (COZAAR) 100 MG tablet Take 1 tablet (100 mg total) by mouth daily. Please keep upcoming appt in March 2022 before anymore refills. Thank you 90 tablet 0  . metFORMIN (GLUCOPHAGE) 500 MG tablet TAKE 1 TABLET(500 MG) BY MOUTH DAILY 90 tablet 3  .  metoprolol succinate (TOPROL-XL) 50 MG 24 hr tablet TAKE 1 TABLET BY MOUTH DAILY WITH OR IMMEDIATELY FOLLOWING A MEAL 90 tablet 3  . modafinil (PROVIGIL) 100 MG tablet TAKE 1 TABLET BY MOUTH around 2 pm 30 tablet 5  . modafinil (PROVIGIL) 200 MG tablet Take 1 tablet (200 mg total) by mouth daily. Take 1 tab daily 9 am 30 tablet 3  . rosuvastatin (CRESTOR) 10 MG tablet TAKE 1 TABLET(10 MG) BY MOUTH DAILY 90 tablet 3  .  Semaglutide,0.25 or 0.5MG /DOS, (OZEMPIC, 0.25 OR 0.5 MG/DOSE,) 2 MG/1.5ML SOPN Inject 0.5 mg into the skin once a week. 1.5 mL 0  . spironolactone (ALDACTONE) 25 MG tablet Take 1 tablet (25 mg total) by mouth daily. Pt must make appt with provider before more refills - 2nd attempt 90 tablet 0  . torsemide (DEMADEX) 20 MG tablet Take 2 tablets (40 mg total) by mouth daily. Pt must make appt with provider before more refills - 2nd attempt 90 tablet 0  . traMADol (ULTRAM) 50 MG tablet Take 1-2 tablets (50-100 mg total) by mouth every 8 (eight) hours as needed. 20 tablet 0  . Vitamin D, Ergocalciferol, (DRISDOL) 1.25 MG (50000 UNIT) CAPS capsule Take 1 capsule (50,000 Units total) by mouth every 7 (seven) days. 4 capsule 0   No current facility-administered medications for this visit.    Medication Side Effects: None  Allergies:  Allergies  Allergen Reactions  . Aspirin Hives and Swelling    REACTION: throat swelling, hives  . Doxycycline Other (See Comments)    Abdominal pain  . Lisinopril Cough  . Niaspan [Niacin Er]     Caused flushing    Past Medical History:  Diagnosis Date  . Acute renal failure (ARF) ([place]) 11/2012    multifactorial-likely secondary to ATN in the setting of sepsis and hypotension, and also from rhabdomyolysis  . Anemia   . Cellulitis   . Chronic diastolic CHF (congestive heart failure) ([place])   . Depression   . Diabetes mellitus ([place])   . GERD (gastroesophageal reflux disease)   . Hyperlipidemia   . Hypertension   . Hypothyroidism   . Morbid obesity with BMI of 70 and over, adult ([place])   . Obesity hypoventilation syndrome ([place])   . OSA (obstructive sleep apnea)   . PVC's (premature ventricular contractions)   . Recurrent cellulitis of lower leg    LLE, venous insuff  . Sepsis ([place]) 11/2012   Secondary to cellulitis  . Sleep apnea   . Venous insufficiency of leg     Past Medical History, Surgical history, Social history, and Family history were reviewed  and updated as appropriate.   Please see review of systems for further details on the patient's review from today.   Objective:   Physical Exam:  There were no vitals taken for this visit.  Physical Exam Constitutional:      General: She is not in acute distress. Musculoskeletal:        General: No deformity.  Neurological:     Mental Status: She is alert and oriented to person, place, and time.     Coordination: Coordination normal.  Psychiatric:        Attention and Perception: Attention and perception normal. She does not perceive auditory or visual hallucinations.        Mood and Affect: Mood normal. Mood is not anxious or depressed. Affect is not labile, blunt, angry or inappropriate.        Speech: Speech normal.  Behavior: Behavior normal.        Thought Content: Thought content normal. Thought content is not paranoid or delusional. Thought content does not include homicidal or suicidal ideation. Thought content does not include homicidal or suicidal plan.        Cognition and Memory: Cognition and memory normal.        Judgment: Judgment normal.     Comments: Insight intact     Lab Review:     Component Value Date/Time   NA 141 11/11/2020 1211   K 4.4 11/11/2020 1211   CL 101 11/11/2020 1211   CO2 25 11/11/2020 1211   GLUCOSE 107 (H) 11/11/2020 1211   GLUCOSE 117 (H) 03/23/2018 1224   BUN 22 11/11/2020 1211   CREATININE 1.00 11/11/2020 1211   CREATININE 1.06 11/14/2016 1050   CALCIUM 9.9 11/11/2020 1211   CALCIUM 9.6 06/26/2007 0000   PROT 6.9 11/11/2020 1211   ALBUMIN 4.1 11/11/2020 1211   AST 14 11/11/2020 1211   ALT 13 11/11/2020 1211   ALKPHOS 82 11/11/2020 1211   BILITOT 0.3 11/11/2020 1211   GFRNONAA 71 11/11/2020 1211   GFRAA 81 11/11/2020 1211       Component Value Date/Time   WBC 5.9 07/25/2018 1234   WBC 7.9 03/23/2018 1224   RBC 5.07 07/25/2018 1234   RBC 5.65 (H) 03/23/2018 1224   HGB 14.8 07/25/2018 1234   HCT 44.8 07/25/2018 1234    PLT 230.0 03/23/2018 1224   MCV 88 07/25/2018 1234   MCH 29.2 07/25/2018 1234   MCH 26.8 04/19/2017 0419   MCHC 33.0 07/25/2018 1234   MCHC 31.6 03/23/2018 1224   RDW 15.6 (H) 07/25/2018 1234   LYMPHSABS 1.4 07/25/2018 1234   MONOABS 0.7 04/16/2017 2351   EOSABS 0.2 07/25/2018 1234   BASOSABS 0.0 07/25/2018 1234    No results found for: POCLITH, LITHIUM   No results found for: PHENYTOIN, PHENOBARB, VALPROATE, CBMZ   .res Assessment: Plan:    Plan:   PDMP reviewed  1. Wellbutrin XL 300mg . Denies seizure history. 2. Add Zoloft 50mg  daily - may increase to 100mg    Read and reviewed note with patient for accuracy.   RTC 3 weeks  Patient advised to contact office with any questions, adverse effects, or acute worsening in signs and symptoms.    Diagnoses and all orders for this visit:  Insomnia, unspecified type  Major depressive disorder, recurrent episode, moderate (HCC) -     sertraline (ZOLOFT) 50 MG tablet; Take 1 tablet (50 mg total) by mouth daily. -     buPROPion (WELLBUTRIN XL) 300 MG 24 hr tablet; Take 1 tablet (300 mg total) by mouth daily.  Obsessional thoughts -     sertraline (ZOLOFT) 50 MG tablet; Take 1 tablet (50 mg total) by mouth daily.     Please see After Visit Summary for patient specific instructions.  Future Appointments  Date Time Provider [place]  03/31/2021  1:00 PM Cottle, Lucious Groves, LCSW [facility] None  04/07/2021  1:00 PM Cottle, Bambi G, LCSW [facility] None  04/14/2021  1:00 PM Cottle, Bambi G, LCSW [facility] None  04/21/2021  1:00 PM Cottle, Bambi G, LCSW [facility] None  04/28/2021  1:00 PM Cottle, Bambi G, LCSW [facility] None  05/12/2021  1:00 PM Cottle, Bambi G, LCSW [facility] None  05/26/2021  1:00 PM Cottle, Bambi G, LCSW [facility] None  07/02/2021  9:40 AM Pemberton, Greer Ee, MD [facility]    No orders of the defined  types were placed in this encounter.   -------------------------------

## 2021-03-31 ENCOUNTER — Ambulatory Visit (INDEPENDENT_AMBULATORY_CARE_PROVIDER_SITE_OTHER): Payer: BC Managed Care – PPO | Admitting: Psychology

## 2021-03-31 DIAGNOSIS — F331 Major depressive disorder, recurrent, moderate: Secondary | ICD-10-CM

## 2021-04-05 ENCOUNTER — Ambulatory Visit (INDEPENDENT_AMBULATORY_CARE_PROVIDER_SITE_OTHER): Payer: BC Managed Care – PPO | Admitting: Family Medicine

## 2021-04-07 ENCOUNTER — Ambulatory Visit (INDEPENDENT_AMBULATORY_CARE_PROVIDER_SITE_OTHER): Payer: BC Managed Care – PPO | Admitting: Psychology

## 2021-04-07 DIAGNOSIS — F331 Major depressive disorder, recurrent, moderate: Secondary | ICD-10-CM

## 2021-04-12 ENCOUNTER — Telehealth: Payer: Self-pay | Admitting: Adult Health

## 2021-04-12 ENCOUNTER — Other Ambulatory Visit: Payer: Self-pay | Admitting: Adult Health

## 2021-04-12 DIAGNOSIS — F331 Major depressive disorder, recurrent, moderate: Secondary | ICD-10-CM

## 2021-04-12 DIAGNOSIS — F428 Other obsessive-compulsive disorder: Secondary | ICD-10-CM

## 2021-04-12 MED ORDER — SERTRALINE HCL 100 MG PO TABS: 100.0000 mg | ORAL_TABLET | Freq: Every day | ORAL | 5 refills | Status: DC

## 2021-04-12 NOTE — Telephone Encounter (Signed)
Please review what do I need to send

## 2021-04-12 NOTE — Telephone Encounter (Signed)
Script sent  

## 2021-04-12 NOTE — Telephone Encounter (Signed)
Pt called and said that she needs a new script of sertraline 100 mg. She said that you discussed increasing it and she did. She is out now took her last pill yeaterday. Please send to the [facility] on [address]

## 2021-04-14 ENCOUNTER — Ambulatory Visit (INDEPENDENT_AMBULATORY_CARE_PROVIDER_SITE_OTHER): Payer: BC Managed Care – PPO | Admitting: Psychology

## 2021-04-14 DIAGNOSIS — F331 Major depressive disorder, recurrent, moderate: Secondary | ICD-10-CM | POA: Diagnosis not present

## 2021-04-16 ENCOUNTER — Telehealth: Payer: Self-pay | Admitting: Adult Health

## 2021-04-16 ENCOUNTER — Ambulatory Visit (INDEPENDENT_AMBULATORY_CARE_PROVIDER_SITE_OTHER): Payer: BC Managed Care – PPO | Admitting: Adult Health

## 2021-04-16 ENCOUNTER — Other Ambulatory Visit: Payer: Self-pay

## 2021-04-16 ENCOUNTER — Encounter: Payer: Self-pay | Admitting: Adult Health

## 2021-04-16 DIAGNOSIS — F331 Major depressive disorder, recurrent, moderate: Secondary | ICD-10-CM

## 2021-04-16 DIAGNOSIS — G4700 Insomnia, unspecified: Secondary | ICD-10-CM | POA: Diagnosis not present

## 2021-04-16 DIAGNOSIS — F428 Other obsessive-compulsive disorder: Secondary | ICD-10-CM | POA: Diagnosis not present

## 2021-04-16 MED ORDER — BUPROPION HCL ER (XL) 150 MG PO TB24: ORAL_TABLET | ORAL | 5 refills | Status: DC

## 2021-04-16 MED ORDER — SERTRALINE HCL 100 MG PO TABS: ORAL_TABLET | ORAL | 5 refills | Status: DC

## 2021-04-16 NOTE — Telephone Encounter (Signed)
Rx was sent 04/11/21 for 100 mg but was discontinued and reordered today.She picked that up and that's why she can't get another today.I called to let her know that but no answer LVM.

## 2021-04-16 NOTE — Progress Notes (Signed)
Alexandra Beck [id number] 07-24-1981 40 y.o.  Subjective:   Patient ID:  Alexandra Beck is a 40 y.o. (DOB 1981-07-07) female.  Chief Complaint: No chief complaint on file.   HPI Alexandra Beck presents to the office today for follow-up of insomnia, obsessional thoughts, and depression.  Describes mood today as "a little better". Pleasant. Not as weepy. Mood symptoms - reports depression, anxiety and irritability. Reports decreased worry and ruminating - "thoughts are not as consuming". Still over analyzing thoughts and conversations - but feels things have "lightened" up some. Has tolerated addition of Zoloft - increased to 100mg  daily - feels like it is starting to help. No change with showering and brushing her teeth - the act of showering is less exhausting - still "dreads" it. Seeing therapist - Bambi Cottle for therapy. Lower interest and motivation. Taking medications as prescribed.  Energy levels low. Active, does not have a regular exercise routine.  Enjoys some usual interests and activities. Married. Lives with wife of 2 and 1/2 years - 2 cats. Spending time with family. Appetite adequate - wanting to eat again. Weight fluctuates by 20 pounds - 440 pounds. Working with a weight management clinic.  Sleeps better some nights than others - not waking up or having troubling dreams. Averages 6.5 hours of more "solid" sleep. Focus and concentration stable. Completing tasks. Managing minimal aspects of household. Works 12 to 9 - at [employer].  Denies SI or HI.  Denies AH or VH. Seeing Bambi Cottle for therapy.  Previous medication trials: Trialed on Wellbutrin at age 81, Trintellix-weird dreams.    [place] Office Visit from 04/17/2018 in [place] Office Visit from 02/27/2018 in [facility] for Infectious Disease Office Visit from 10/11/2017 in [facility] for [facility]ious Disease  PHQ-2 Total Score 6 0 0  PHQ-9 Total Score  23 -- --       Review of Systems:  Review of Systems  Musculoskeletal: Negative for gait problem.  Neurological: Negative for tremors.  Psychiatric/Behavioral:       Please refer to HPI    Medications: I have reviewed the patient's current medications.  Current Outpatient Medications  Medication Sig Dispense Refill  . buPROPion (WELLBUTRIN XL) 150 MG 24 hr tablet Take three tablets every morning. 90 tablet 5  . levothyroxine (SYNTHROID) 150 MCG tablet TAKE 1 TABLET(150 MCG) BY MOUTH DAILY BEFORE BREAKFAST 90 tablet 0  . losartan (COZAAR) 100 MG tablet Take 1 tablet (100 mg total) by mouth daily. Please keep upcoming appt in March 2022 before anymore refills. Thank you 90 tablet 0  . metFORMIN (GLUCOPHAGE) 500 MG tablet TAKE 1 TABLET(500 MG) BY MOUTH DAILY 90 tablet 3  . metoprolol succinate (TOPROL-XL) 50 MG 24 hr tablet TAKE 1 TABLET BY MOUTH DAILY WITH OR IMMEDIATELY FOLLOWING A MEAL 90 tablet 3  . modafinil (PROVIGIL) 100 MG tablet TAKE 1 TABLET BY MOUTH around 2 pm 30 tablet 5  . modafinil (PROVIGIL) 200 MG tablet Take 1 tablet (200 mg total) by mouth daily. Take 1 tab daily 9 am 30 tablet 3  . rosuvastatin (CRESTOR) 10 MG tablet TAKE 1 TABLET(10 MG) BY MOUTH DAILY 90 tablet 3  . Semaglutide,0.25 or 0.5MG /DOS, (OZEMPIC, 0.25 OR 0.5 MG/DOSE,) 2 MG/1.5ML SOPN Inject 0.5 mg into the skin once a week. 1.5 mL 0  . sertraline (ZOLOFT) 100 MG tablet Take two tablets daily. 60 tablet 5  . spironolactone (ALDACTONE) 25  MG tablet Take 1 tablet (25 mg total) by mouth daily. Pt must make appt with provider before more refills - 2nd attempt 90 tablet 0  . torsemide (DEMADEX) 20 MG tablet Take 2 tablets (40 mg total) by mouth daily. Pt must make appt with provider before more refills - 2nd attempt 90 tablet 0  . traMADol (ULTRAM) 50 MG tablet Take 1-2 tablets (50-100 mg total) by mouth every 8 (eight) hours as needed. 20 tablet 0  . Vitamin D, Ergocalciferol, (DRISDOL) 1.25 MG (50000 UNIT) CAPS  capsule Take 1 capsule (50,000 Units total) by mouth every 7 (seven) days. 4 capsule 0   No current facility-administered medications for this visit.    Medication Side Effects: None  Allergies:  Allergies  Allergen Reactions  . Aspirin Hives and Swelling    REACTION: throat swelling, hives  . Doxycycline Other (See Comments)    Abdominal pain  . Lisinopril Cough  . Niaspan [Niacin Er]     Caused flushing    Past Medical History:  Diagnosis Date  . Acute renal failure (ARF) ([place]) 11/2012    multifactorial-likely secondary to ATN in the setting of sepsis and hypotension, and also from rhabdomyolysis  . Anemia   . Cellulitis   . Chronic diastolic CHF (congestive heart failure) ([place])   . Depression   . Diabetes mellitus ([place])   . GERD (gastroesophageal reflux disease)   . Hyperlipidemia   . Hypertension   . Hypothyroidism   . Morbid obesity with BMI of 70 and over, adult ([place])   . Obesity hypoventilation syndrome ([place])   . OSA (obstructive sleep apnea)   . PVC's (premature ventricular contractions)   . Recurrent cellulitis of lower leg    LLE, venous insuff  . Sepsis ([place]) 11/2012   Secondary to cellulitis  . Sleep apnea   . Venous insufficiency of leg     Past Medical History, Surgical history, Social history, and Family history were reviewed and updated as appropriate.   Please see review of systems for further details on the patient's review from today.   Objective:   Physical Exam:  There were no vitals taken for this visit.  Physical Exam Constitutional:      General: She is not in acute distress. Musculoskeletal:        General: No deformity.  Neurological:     Mental Status: She is alert and oriented to person, place, and time.     Coordination: Coordination normal.  Psychiatric:        Attention and Perception: Attention and perception normal. She does not perceive auditory or visual hallucinations.        Mood and Affect: Mood normal. Mood is not  anxious or depressed. Affect is not labile, blunt, angry or inappropriate.        Speech: Speech normal.        Behavior: Behavior normal.        Thought Content: Thought content normal. Thought content is not paranoid or delusional. Thought content does not include homicidal or suicidal ideation. Thought content does not include homicidal or suicidal plan.        Cognition and Memory: Cognition and memory normal.        Judgment: Judgment normal.     Comments: Insight intact     Lab Review:     Component Value Date/Time   NA 141 11/11/2020 1211   K 4.4 11/11/2020 1211   CL 101 11/11/2020 1211   CO2 25 11/11/2020  1211   GLUCOSE 107 (H) 11/11/2020 1211   GLUCOSE 117 (H) 03/23/2018 1224   BUN 22 11/11/2020 1211   CREATININE 1.00 11/11/2020 1211   CREATININE 1.06 11/14/2016 1050   CALCIUM 9.9 11/11/2020 1211   CALCIUM 9.6 06/26/2007 0000   PROT 6.9 11/11/2020 1211   ALBUMIN 4.1 11/11/2020 1211   AST 14 11/11/2020 1211   ALT 13 11/11/2020 1211   ALKPHOS 82 11/11/2020 1211   BILITOT 0.3 11/11/2020 1211   GFRNONAA 71 11/11/2020 1211   GFRAA 81 11/11/2020 1211       Component Value Date/Time   WBC 5.9 07/25/2018 1234   WBC 7.9 03/23/2018 1224   RBC 5.07 07/25/2018 1234   RBC 5.65 (H) 03/23/2018 1224   HGB 14.8 07/25/2018 1234   HCT 44.8 07/25/2018 1234   PLT 230.0 03/23/2018 1224   MCV 88 07/25/2018 1234   MCH 29.2 07/25/2018 1234   MCH 26.8 04/19/2017 0419   MCHC 33.0 07/25/2018 1234   MCHC 31.6 03/23/2018 1224   RDW 15.6 (H) 07/25/2018 1234   LYMPHSABS 1.4 07/25/2018 1234   MONOABS 0.7 04/16/2017 2351   EOSABS 0.2 07/25/2018 1234   BASOSABS 0.0 07/25/2018 1234    No results found for: POCLITH, LITHIUM   No results found for: PHENYTOIN, PHENOBARB, VALPROATE, CBMZ   .res Assessment: Plan:    Plan:   PDMP reviewed  1. Wellbutrin XL 300mg  to 450mg . Denies seizure history. 2. Zoloft 100mg  daily - may increase to 200mg    Read and reviewed note with patient  for accuracy.   RTC 3 weeks  Patient advised to contact office with any questions, adverse effects, or acute worsening in signs and symptoms.   Diagnoses and all orders for this visit:  Major depressive disorder, recurrent episode, moderate (HCC) -     buPROPion (WELLBUTRIN XL) 150 MG 24 hr tablet; Take three tablets every morning. -     sertraline (ZOLOFT) 100 MG tablet; Take two tablets daily.  Obsessional thoughts -     sertraline (ZOLOFT) 100 MG tablet; Take two tablets daily.  Insomnia, unspecified type -     buPROPion (WELLBUTRIN XL) 150 MG 24 hr tablet; Take three tablets every morning.     Please see After Visit Summary for patient specific instructions.  Future Appointments  Date Time Provider Victor  04/21/2021  1:00 PM Cottle, Lucious Groves, LCSW [facility] None  04/28/2021  1:00 PM Cottle, Bambi G, LCSW [facility] None  05/05/2021  1:00 PM Cottle, Bambi G, LCSW [facility] None  05/14/2021 11:20 AM Chemika Nightengale, Berdie Ogren, NP [facility] None  05/26/2021  1:00 PM Cottle, Bambi G, LCSW [facility] None  07/02/2021  9:40 AM Pemberton, Greer Ee, MD [facility]    No orders of the defined types were placed in this encounter.   -------------------------------

## 2021-04-16 NOTE — Telephone Encounter (Signed)
Noted  

## 2021-04-16 NOTE — Telephone Encounter (Signed)
Alexandra Beck can you call [facility] and let them know she had dose increase.

## 2021-04-16 NOTE — Telephone Encounter (Signed)
After appt Tennelle was prescribed Setraline 100mg . She states that she received a message from pharmacy stating it's too soon for a refill. She inquired about seeing if there is anything RM can do. Pls call [phone]. Pharmacy [facility] [place], [place]

## 2021-04-21 ENCOUNTER — Encounter (INDEPENDENT_AMBULATORY_CARE_PROVIDER_SITE_OTHER): Payer: Self-pay | Admitting: Family Medicine

## 2021-04-21 ENCOUNTER — Ambulatory Visit (INDEPENDENT_AMBULATORY_CARE_PROVIDER_SITE_OTHER): Payer: BC Managed Care – PPO | Admitting: Psychology

## 2021-04-21 DIAGNOSIS — F331 Major depressive disorder, recurrent, moderate: Secondary | ICD-10-CM

## 2021-04-22 ENCOUNTER — Other Ambulatory Visit (INDEPENDENT_AMBULATORY_CARE_PROVIDER_SITE_OTHER): Payer: Self-pay | Admitting: Family Medicine

## 2021-04-22 DIAGNOSIS — E1169 Type 2 diabetes mellitus with other specified complication: Secondary | ICD-10-CM

## 2021-04-27 NOTE — Telephone Encounter (Signed)
Last seen Dr Jearld Shines

## 2021-04-28 ENCOUNTER — Other Ambulatory Visit (INDEPENDENT_AMBULATORY_CARE_PROVIDER_SITE_OTHER): Payer: Self-pay

## 2021-04-28 ENCOUNTER — Ambulatory Visit (INDEPENDENT_AMBULATORY_CARE_PROVIDER_SITE_OTHER): Payer: BC Managed Care – PPO | Admitting: Psychology

## 2021-04-28 DIAGNOSIS — F331 Major depressive disorder, recurrent, moderate: Secondary | ICD-10-CM

## 2021-04-28 DIAGNOSIS — E1169 Type 2 diabetes mellitus with other specified complication: Secondary | ICD-10-CM

## 2021-04-28 MED ORDER — OZEMPIC (0.25 OR 0.5 MG/DOSE) 2 MG/1.5ML ~~LOC~~ SOPN: 0.5000 mg | PEN_INJECTOR | SUBCUTANEOUS | 0 refills | Status: DC

## 2021-04-29 ENCOUNTER — Other Ambulatory Visit: Payer: Self-pay

## 2021-04-29 ENCOUNTER — Ambulatory Visit (INDEPENDENT_AMBULATORY_CARE_PROVIDER_SITE_OTHER): Payer: BC Managed Care – PPO | Admitting: Family Medicine

## 2021-04-29 ENCOUNTER — Encounter (INDEPENDENT_AMBULATORY_CARE_PROVIDER_SITE_OTHER): Payer: Self-pay | Admitting: Family Medicine

## 2021-04-29 VITALS — BP 144/80 | HR 67 | Temp 98.1°F | Ht 63.0 in | Wt >= 6400 oz

## 2021-04-29 DIAGNOSIS — Z6841 Body Mass Index (BMI) 40.0 and over, adult: Secondary | ICD-10-CM | POA: Diagnosis not present

## 2021-04-29 DIAGNOSIS — Z9189 Other specified personal risk factors, not elsewhere classified: Secondary | ICD-10-CM

## 2021-04-29 DIAGNOSIS — E785 Hyperlipidemia, unspecified: Secondary | ICD-10-CM

## 2021-04-29 DIAGNOSIS — E1169 Type 2 diabetes mellitus with other specified complication: Secondary | ICD-10-CM

## 2021-05-05 ENCOUNTER — Ambulatory Visit (INDEPENDENT_AMBULATORY_CARE_PROVIDER_SITE_OTHER): Payer: BC Managed Care – PPO | Admitting: Psychology

## 2021-05-05 DIAGNOSIS — F331 Major depressive disorder, recurrent, moderate: Secondary | ICD-10-CM

## 2021-05-05 NOTE — Progress Notes (Signed)
Chief Complaint:   OBESITY Alexandra Beck is here to discuss her progress with her obesity treatment plan along with follow-up of her obesity related diagnoses. Alexandra Beck is on practicing portion control and making smarter food choices, such as increasing vegetables and decreasing simple carbohydrates and states she is following her eating plan approximately 0% of the time. Alexandra Beck states she is not currently exercising.  Today's visit was #: 38 Starting weight: 468 lbs Starting date: 5/19/019 Today's weight: 439 lbs Today's date: 04/29/2021 Total lbs lost to date: 29 Total lbs lost since last in-office visit: 0  Interim History: This is Alexandra Beck's first RTC since 03/08/2021. She hasn't made many changes since her last appt; noticed indulgent eating increased after cessation of Ozempic (she accidentally ran out). She has increased Wellbutrin and Zoloft. Pt has been trying to get back into the habit of cooking at home, especially for dinner. She went through a phase of not eating much.  Subjective:   1. Type 2 diabetes mellitus with other specified complication, without long-term current use of insulin ([place]) Alexandra Beck just got Ozempic refilled yesterday. She denies GI side effects.  2. Hyperlipidemia associated with type 2 diabetes mellitus (HCC) LDL 103, HDL 53, and triglycerides 169. Alexandra Beck is on Crestor with no myalgias.  3. At risk for side effect of medication Alexandra Beck is at risk for side effects of medication due to restarting Ozempic.  Assessment/Plan:   1. Type 2 diabetes mellitus with other specified complication, without long-term current use of insulin (HCC) Good blood sugar control is important to decrease the likelihood of diabetic complications such as nephropathy, neuropathy, limb loss, blindness, coronary artery disease, and death. Intensive lifestyle modification including diet, exercise and weight loss are the first line of treatment for diabetes. Continue Ozempic with no change in dose.    2. Hyperlipidemia associated with type 2 diabetes mellitus ([place]) Cardiovascular risk and specific lipid/LDL goals reviewed.  We discussed several lifestyle modifications today and Alexandra Beck will continue to work on diet, exercise and weight loss efforts. Orders and follow up as documented in patient record. Continue Crestor.  Counseling Intensive lifestyle modifications are the first line treatment for this issue. Dietary changes: Increase soluble fiber. Decrease simple carbohydrates. Exercise changes: Moderate to vigorous-intensity aerobic activity 150 minutes per week if tolerated. Lipid-lowering medications: see documented in medical record.  3. At risk for side effect of medication Alexandra Beck was given approximately 15 minutes of drug side effect counseling today.  We discussed side effect possibility and risk versus benefits. Alexandra Beck agreed to the medication and will contact this office if these side effects are intolerable.  Repetitive spaced learning was employed today to elicit superior memory formation and behavioral change.  4. Class 3 severe obesity with serious comorbidity and body mass index (BMI) greater than or equal to 70 in adult, unspecified obesity type ([place])  Alexandra Beck is currently in the action stage of change. As such, her goal is to continue with weight loss efforts. She has agreed to practicing portion control and making smarter food choices, such as increasing vegetables and decreasing simple carbohydrates.   Exercise goals: No exercise has been prescribed at this time.  Behavioral modification strategies: increasing lean protein intake, meal planning and cooking strategies, keeping healthy foods in the home and planning for success.  Alexandra Beck has agreed to follow-up with our clinic in 3-4 weeks. She was informed of the importance of frequent follow-up visits to maximize her success with intensive lifestyle modifications for her multiple health conditions.  Objective:   Blood  pressure (!) 144/80, pulse 67, temperature 98.1 F (36.7 C), height 5\' 3"  (1.6 m), weight (!) 439 lb (199.1 kg), SpO2 96 %. Body mass index is 77.77 kg/m.  General: Cooperative, alert, well developed, in no acute distress. HEENT: Conjunctivae and lids unremarkable. Cardiovascular: Regular rhythm.  Lungs: Normal work of breathing. Neurologic: No focal deficits.   Lab Results  Component Value Date   CREATININE 1.00 11/11/2020   BUN 22 11/11/2020   NA 141 11/11/2020   K 4.4 11/11/2020   CL 101 11/11/2020   CO2 25 11/11/2020   Lab Results  Component Value Date   ALT 13 11/11/2020   AST 14 11/11/2020   ALKPHOS 82 11/11/2020   BILITOT 0.3 11/11/2020   Lab Results  Component Value Date   HGBA1C 5.9 (H) 11/11/2020   HGBA1C 5.6 12/17/2019   HGBA1C 5.6 07/04/2019   HGBA1C 5.4 12/04/2018   HGBA1C 5.8 (H) 07/25/2018   Lab Results  Component Value Date   INSULIN 23.6 11/11/2020   INSULIN 17.1 12/17/2019   INSULIN 23.5 07/04/2019   INSULIN 25.8 (H) 12/04/2018   INSULIN 24.7 07/25/2018   Lab Results  Component Value Date   TSH 2.94 01/08/2021   Lab Results  Component Value Date   CHOL 185 11/11/2020   HDL 53 11/11/2020   LDLCALC 103 (H) 11/11/2020   LDLDIRECT 141.0 03/02/2016   TRIG 169 (H) 11/11/2020   CHOLHDL 4 03/23/2018   Lab Results  Component Value Date   WBC 5.9 07/25/2018   HGB 14.8 07/25/2018   HCT 44.8 07/25/2018   MCV 88 07/25/2018   PLT 230.0 03/23/2018   No results found for: IRON, TIBC, FERRITIN  Attestation Statements:   Reviewed by clinician on day of visit: allergies, medications, problem list, medical history, surgical history, family history, social history, and previous encounter notes.  Coral Ceo, CMA, am acting as transcriptionist for Coralie Common, MD.   I have reviewed the above documentation for accuracy and completeness, and I agree with the above. - Jinny Blossom, MD

## 2021-05-12 ENCOUNTER — Ambulatory Visit: Payer: BC Managed Care – PPO | Admitting: Psychology

## 2021-05-14 ENCOUNTER — Other Ambulatory Visit: Payer: Self-pay

## 2021-05-14 ENCOUNTER — Encounter: Payer: Self-pay | Admitting: Adult Health

## 2021-05-14 ENCOUNTER — Ambulatory Visit (INDEPENDENT_AMBULATORY_CARE_PROVIDER_SITE_OTHER): Payer: BC Managed Care – PPO | Admitting: Adult Health

## 2021-05-14 DIAGNOSIS — G4700 Insomnia, unspecified: Secondary | ICD-10-CM

## 2021-05-14 DIAGNOSIS — L03221 Cellulitis of neck: Secondary | ICD-10-CM | POA: Insufficient documentation

## 2021-05-14 DIAGNOSIS — F331 Major depressive disorder, recurrent, moderate: Secondary | ICD-10-CM | POA: Diagnosis not present

## 2021-05-14 DIAGNOSIS — F428 Other obsessive-compulsive disorder: Secondary | ICD-10-CM

## 2021-05-14 NOTE — Progress Notes (Signed)
Alexandra Beck [id number] 01-01-1981 40 y.o.  Subjective:   Patient ID:  Alexandra Beck is a 40 y.o. (DOB 1981-09-01) female.  Chief Complaint: No chief complaint on file.   HPI Alexandra Beck presents to the office today for follow-up of insomnia, obsessional thoughts, and depression.  Describes mood today as "improved". Pleasant. Denies tearfulness. Mood symptoms - reports decreased depression, anxiety and irritability. Decreased worry and ruminating - "it's much less". Not over analyzing thoughts and conversations like she was. Still "carefully weighs" what she says. Has increased Zoloft to 200mg  daily - 2 weeks. No change with showering and brushing her teeth - "still no interest in prioritizing that". Seeing therapist - Bambi Cottle for therapy. Varying interest and motivation. Taking medications as prescribed.  Energy levels low. Active, does not have a regular exercise routine.  Enjoys some usual interests and activities. Married. Lives with wife of 2 and 1/2 years - 2 cats. Spending time with family. Gaming online with friends. Appetite adequate. Weight gain - 20 pounds - 440 pounds. Working with a weight management clinic.  Sleeping better at night.. Averages 6 to 7. Focus and concentration stable. Completing tasks. Managing minimal aspects of household. Works 12 to 9 - at [employer].  Denies SI or HI.  Denies AH or VH. Seeing Bambi Cottle for therapy.  Previous medication trials: Trialed on Wellbutrin at age 5, Trintellix-weird dreams.   [place] Office Visit from 04/17/2018 in [place] Office Visit from 02/27/2018 in [facility] for Infectious Disease Office Visit from 10/11/2017 in [facility] for Infectious Disease  PHQ-2 Total Score 6 0 0  PHQ-9 Total Score 23 -- --        Review of Systems:  Review of Systems  Musculoskeletal:  Negative for gait problem.  Neurological:  Negative for tremors.   Psychiatric/Behavioral:         Please refer to HPI   Medications: I have reviewed the patient's current medications.  Current Outpatient Medications  Medication Sig Dispense Refill   buPROPion (WELLBUTRIN XL) 150 MG 24 hr tablet Take three tablets every morning. 90 tablet 5   levothyroxine (SYNTHROID) 150 MCG tablet TAKE 1 TABLET(150 MCG) BY MOUTH DAILY BEFORE BREAKFAST 90 tablet 0   losartan (COZAAR) 100 MG tablet Take 1 tablet (100 mg total) by mouth daily. Please keep upcoming appt in March 2022 before anymore refills. Thank you 90 tablet 0   metFORMIN (GLUCOPHAGE) 500 MG tablet TAKE 1 TABLET(500 MG) BY MOUTH DAILY 90 tablet 3   metoprolol succinate (TOPROL-XL) 50 MG 24 hr tablet TAKE 1 TABLET BY MOUTH DAILY WITH OR IMMEDIATELY FOLLOWING A MEAL 90 tablet 3   modafinil (PROVIGIL) 100 MG tablet TAKE 1 TABLET BY MOUTH around 2 pm 30 tablet 5   modafinil (PROVIGIL) 200 MG tablet Take 1 tablet (200 mg total) by mouth daily. Take 1 tab daily 9 am 30 tablet 3   rosuvastatin (CRESTOR) 10 MG tablet TAKE 1 TABLET(10 MG) BY MOUTH DAILY 90 tablet 3   Semaglutide,0.25 or 0.5MG /DOS, (OZEMPIC, 0.25 OR 0.5 MG/DOSE,) 2 MG/1.5ML SOPN Inject 0.5 mg into the skin once a week. 1.5 mL 0   sertraline (ZOLOFT) 100 MG tablet Take two tablets daily. 60 tablet 5   spironolactone (ALDACTONE) 25 MG tablet Take 1 tablet (25 mg total) by mouth daily. Pt must make appt with provider before more refills - 2nd attempt 90 tablet 0  torsemide (DEMADEX) 20 MG tablet Take 2 tablets (40 mg total) by mouth daily. Pt must make appt with provider before more refills - 2nd attempt 90 tablet 0   traMADol (ULTRAM) 50 MG tablet Take 1-2 tablets (50-100 mg total) by mouth every 8 (eight) hours as needed. 20 tablet 0   No current facility-administered medications for this visit.    Medication Side Effects: None  Allergies:  Allergies  Allergen Reactions   Aspirin Hives and Swelling    REACTION: throat swelling, hives Other  reaction(s): Unknown   Doxycycline Other (See Comments)    Abdominal pain Other reaction(s): Unknown   Lisinopril Cough   Niacin And Related     Other reaction(s): Unknown   Niaspan [Niacin Er]     Caused flushing    Past Medical History:  Diagnosis Date   Acute renal failure (ARF) ([place]) 11/2012    multifactorial-likely secondary to ATN in the setting of sepsis and hypotension, and also from rhabdomyolysis   Anemia    Cellulitis    Chronic diastolic CHF (congestive heart failure) (HCC)    Depression    Diabetes mellitus (HCC)    GERD (gastroesophageal reflux disease)    Hyperlipidemia    Hypertension    Hypothyroidism    Morbid obesity with BMI of 70 and over, adult ([place])    Obesity hypoventilation syndrome (HCC)    OSA (obstructive sleep apnea)    PVC's (premature ventricular contractions)    Recurrent cellulitis of lower leg    LLE, venous insuff   Sepsis ([place]) 11/2012   Secondary to cellulitis   Sleep apnea    Venous insufficiency of leg     Past Medical History, Surgical history, Social history, and Family history were reviewed and updated as appropriate.   Please see review of systems for further details on the patient's review from today.   Objective:   Physical Exam:  There were no vitals taken for this visit.  Physical Exam Constitutional:      General: She is not in acute distress. Musculoskeletal:        General: No deformity.  Neurological:     Mental Status: She is alert and oriented to person, place, and time.     Coordination: Coordination normal.  Psychiatric:        Attention and Perception: Attention and perception normal. She does not perceive auditory or visual hallucinations.        Mood and Affect: Mood normal. Mood is not anxious or depressed. Affect is not labile, blunt, angry or inappropriate.        Speech: Speech normal.        Behavior: Behavior normal.        Thought Content: Thought content normal. Thought content is not paranoid  or delusional. Thought content does not include homicidal or suicidal ideation. Thought content does not include homicidal or suicidal plan.        Cognition and Memory: Cognition and memory normal.        Judgment: Judgment normal.     Comments: Insight intact    Lab Review:     Component Value Date/Time   NA 141 11/11/2020 1211   K 4.4 11/11/2020 1211   CL 101 11/11/2020 1211   CO2 25 11/11/2020 1211   GLUCOSE 107 (H) 11/11/2020 1211   GLUCOSE 117 (H) 03/23/2018 1224   BUN 22 11/11/2020 1211   CREATININE 1.00 11/11/2020 1211   CREATININE 1.06 11/14/2016 1050   CALCIUM 9.9 11/11/2020  1211   CALCIUM 9.6 06/26/2007 0000   PROT 6.9 11/11/2020 1211   ALBUMIN 4.1 11/11/2020 1211   AST 14 11/11/2020 1211   ALT 13 11/11/2020 1211   ALKPHOS 82 11/11/2020 1211   BILITOT 0.3 11/11/2020 1211   GFRNONAA 71 11/11/2020 1211   GFRAA 81 11/11/2020 1211       Component Value Date/Time   WBC 5.9 07/25/2018 1234   WBC 7.9 03/23/2018 1224   RBC 5.07 07/25/2018 1234   RBC 5.65 (H) 03/23/2018 1224   HGB 14.8 07/25/2018 1234   HCT 44.8 07/25/2018 1234   PLT 230.0 03/23/2018 1224   MCV 88 07/25/2018 1234   MCH 29.2 07/25/2018 1234   MCH 26.8 04/19/2017 0419   MCHC 33.0 07/25/2018 1234   MCHC 31.6 03/23/2018 1224   RDW 15.6 (H) 07/25/2018 1234   LYMPHSABS 1.4 07/25/2018 1234   MONOABS 0.7 04/16/2017 2351   EOSABS 0.2 07/25/2018 1234   BASOSABS 0.0 07/25/2018 1234    No results found for: POCLITH, LITHIUM   No results found for: PHENYTOIN, PHENOBARB, VALPROATE, CBMZ   .res Assessment: Plan:    Plan:   PDMP reviewed  1. Wellbutrin XL 450mg . Denies seizure history. 2. Zoloft 100mg  daily - take two tablets daily  Read and reviewed note with patient for accuracy.   RTC 3 weeks  Patient advised to contact office with any questions, adverse effects, or acute worsening in signs and symptoms.  Diagnoses and all orders for this visit:  Major depressive disorder, recurrent  episode, moderate ([place])  Obsessional thoughts  Insomnia, unspecified type    Please see After Visit Summary for patient specific instructions.  Future Appointments  Date Time Provider [place]  05/19/2021  1:00 PM Cottle, Lucious Groves, LCSW [facility] None  05/20/2021  2:00 PM Laqueta Linden, MD [facility] None  05/26/2021  1:00 PM Cottle, Bambi G, LCSW [facility] None  06/02/2021  1:00 PM Cottle, Bambi G, LCSW [facility] None  06/09/2021  1:00 PM Cottle, Bambi G, LCSW [facility] None  07/02/2021  9:40 AM Pemberton, Greer Ee, MD [facility]    No orders of the defined types were placed in this encounter.   -------------------------------

## 2021-05-19 ENCOUNTER — Encounter (INDEPENDENT_AMBULATORY_CARE_PROVIDER_SITE_OTHER): Payer: Self-pay | Admitting: Family Medicine

## 2021-05-19 ENCOUNTER — Ambulatory Visit (INDEPENDENT_AMBULATORY_CARE_PROVIDER_SITE_OTHER): Payer: BC Managed Care – PPO | Admitting: Psychology

## 2021-05-19 ENCOUNTER — Other Ambulatory Visit: Payer: Self-pay | Admitting: *Deleted

## 2021-05-19 DIAGNOSIS — F331 Major depressive disorder, recurrent, moderate: Secondary | ICD-10-CM

## 2021-05-19 MED ORDER — TORSEMIDE 20 MG PO TABS: 40.0000 mg | ORAL_TABLET | Freq: Every day | ORAL | 0 refills | Status: DC

## 2021-05-20 ENCOUNTER — Ambulatory Visit (INDEPENDENT_AMBULATORY_CARE_PROVIDER_SITE_OTHER): Payer: BC Managed Care – PPO | Admitting: Family Medicine

## 2021-05-26 ENCOUNTER — Ambulatory Visit: Payer: BC Managed Care – PPO | Admitting: Psychology

## 2021-06-02 ENCOUNTER — Other Ambulatory Visit (INDEPENDENT_AMBULATORY_CARE_PROVIDER_SITE_OTHER): Payer: Self-pay | Admitting: Family Medicine

## 2021-06-02 ENCOUNTER — Ambulatory Visit (INDEPENDENT_AMBULATORY_CARE_PROVIDER_SITE_OTHER): Payer: BC Managed Care – PPO | Admitting: Psychology

## 2021-06-02 DIAGNOSIS — E1169 Type 2 diabetes mellitus with other specified complication: Secondary | ICD-10-CM

## 2021-06-02 DIAGNOSIS — F331 Major depressive disorder, recurrent, moderate: Secondary | ICD-10-CM | POA: Diagnosis not present

## 2021-06-02 NOTE — Telephone Encounter (Signed)
Dr.Ukleja 

## 2021-06-03 ENCOUNTER — Encounter (INDEPENDENT_AMBULATORY_CARE_PROVIDER_SITE_OTHER): Payer: Self-pay

## 2021-06-03 MED ORDER — OZEMPIC (0.25 OR 0.5 MG/DOSE) 2 MG/1.5ML ~~LOC~~ SOPN: 0.5000 mg | PEN_INJECTOR | SUBCUTANEOUS | 0 refills | Status: DC

## 2021-06-09 ENCOUNTER — Ambulatory Visit (INDEPENDENT_AMBULATORY_CARE_PROVIDER_SITE_OTHER): Payer: BC Managed Care – PPO | Admitting: Psychology

## 2021-06-09 DIAGNOSIS — F331 Major depressive disorder, recurrent, moderate: Secondary | ICD-10-CM | POA: Diagnosis not present

## 2021-06-11 ENCOUNTER — Encounter: Payer: Self-pay | Admitting: Adult Health

## 2021-06-11 ENCOUNTER — Other Ambulatory Visit: Payer: Self-pay

## 2021-06-11 ENCOUNTER — Ambulatory Visit (INDEPENDENT_AMBULATORY_CARE_PROVIDER_SITE_OTHER): Payer: BC Managed Care – PPO | Admitting: Adult Health

## 2021-06-11 DIAGNOSIS — G4700 Insomnia, unspecified: Secondary | ICD-10-CM | POA: Diagnosis not present

## 2021-06-11 DIAGNOSIS — F428 Other obsessive-compulsive disorder: Secondary | ICD-10-CM

## 2021-06-11 DIAGNOSIS — F331 Major depressive disorder, recurrent, moderate: Secondary | ICD-10-CM

## 2021-06-11 MED ORDER — ARIPIPRAZOLE 5 MG PO TABS: 5.0000 mg | ORAL_TABLET | Freq: Every day | ORAL | 5 refills | Status: DC

## 2021-06-11 NOTE — Progress Notes (Signed)
Alexandra Beck [id number] 1981/06/23 40 y.o.  Subjective:   Patient ID:  Alexandra Beck is a 40 y.o. (DOB Oct 30, 1981) female.  Chief Complaint: No chief complaint on file.   HPI Alexandra Beck presents to the office today for follow-up of insomnia, obsessional thoughts, and depression.  Describes mood today as "ok". Pleasant. Denies tearfulness. Mood symptoms - reports decreased depression, anxiety and irritability. Decreased worry and ruminating - "not as plagued by the negative thoughts". Has increased Zoloft to 200mg  daily and feels it works well. Continues to struggle with depressive thoughts. Does not feel like the Wellbutrin has helped with the interest and motivation. Having thoughts of dread. Difficulties  with showering and brushing her teeth. Seeing therapist - Alexandra Beck for therapy. Varying interest and motivation. Taking medications as prescribed.  Energy levels low. Active, does not have a regular exercise routine.  Enjoys some usual interests and activities. Married. Lives with wife of 2 and 1/2 years - 2 cats. Spending time with family. Gaming online with friends. Appetite adequate. Weight gain - 20 pounds - 440 pounds. Working with a weight management clinic.  Sleeping better at night.. Averages 6 to 7. Focus and concentration stable. Completing tasks. Managing minimal aspects of household. Works 12 to 9 - at [employer].  Denies SI or HI.  Denies AH or VH. Seeing Alexandra Beck for therapy.  Previous medication trials: Trialed on Wellbutrin at age 40, Trintellix-weird dreams.    [place] Office Visit from 04/17/2018 in [place] Office Visit from 02/27/2018 in [facility] Office Visit from 10/11/2017 in [facility]  PHQ-2 Total Score 6 0 0  PHQ-9 Total Score 23 -- --        Review of Systems:  Review of Systems  Musculoskeletal:  Negative for gait problem.   Neurological:  Negative for tremors.  Psychiatric/Behavioral:         Please refer to HPI   Medications: I have reviewed the patient's current medications.  Current Outpatient Medications  Medication Sig Dispense Refill   ARIPiprazole (ABILIFY) 5 MG tablet Take 1 tablet (5 mg total) by mouth daily. 30 tablet 5   buPROPion (WELLBUTRIN XL) 150 MG 24 hr tablet Take three tablets every morning. 90 tablet 5   levothyroxine (SYNTHROID) 150 MCG tablet TAKE 1 TABLET(150 MCG) BY MOUTH DAILY BEFORE BREAKFAST 90 tablet 0   losartan (COZAAR) 100 MG tablet Take 1 tablet (100 mg total) by mouth daily. Please keep upcoming appt in March 2022 before anymore refills. Thank you 90 tablet 0   metFORMIN (GLUCOPHAGE) 500 MG tablet TAKE 1 TABLET(500 MG) BY MOUTH DAILY 90 tablet 3   metoprolol succinate (TOPROL-XL) 50 MG 24 hr tablet TAKE 1 TABLET BY MOUTH DAILY WITH OR IMMEDIATELY FOLLOWING A MEAL 90 tablet 3   modafinil (PROVIGIL) 100 MG tablet TAKE 1 TABLET BY MOUTH around 2 pm 30 tablet 5   modafinil (PROVIGIL) 200 MG tablet Take 1 tablet (200 mg total) by mouth daily. Take 1 tab daily 9 am 30 tablet 3   rosuvastatin (CRESTOR) 10 MG tablet TAKE 1 TABLET(10 MG) BY MOUTH DAILY 90 tablet 3   Semaglutide,0.25 or 0.5MG /DOS, (OZEMPIC, 0.25 OR 0.5 MG/DOSE,) 2 MG/1.5ML SOPN Inject 0.5 mg into the skin once a week. 1.5 mL 0   sertraline (ZOLOFT) 100 MG tablet Take two tablets daily. 60 tablet 5   spironolactone (ALDACTONE) 25 MG  tablet Take 1 tablet (25 mg total) by mouth daily. Pt must make appt with provider before more refills - 2nd attempt 90 tablet 0   torsemide (DEMADEX) 20 MG tablet Take 2 tablets (40 mg total) by mouth daily. Pt must make appt with provider before more refills - 2nd attempt 180 tablet 0   traMADol (ULTRAM) 50 MG tablet Take 1-2 tablets (50-100 mg total) by mouth every 8 (eight) hours as needed. 20 tablet 0   No current facility-administered medications for this visit.    Medication Side  Effects: None  Allergies:  Allergies  Allergen Reactions   Aspirin Hives and Swelling    REACTION: throat swelling, hives Other reaction(s): Unknown   Doxycycline Other (See Comments)    Abdominal pain Other reaction(s): Unknown   Lisinopril Cough   Niacin And Related     Other reaction(s): Unknown   Niaspan [Niacin Er]     Caused flushing    Past Medical History:  Diagnosis Date   Acute renal failure (ARF) ([place]) 11/2012    multifactorial-likely secondary to ATN in the setting of sepsis and hypotension, and also from rhabdomyolysis   Anemia    Cellulitis    Chronic diastolic CHF (congestive heart failure) (HCC)    Depression    Diabetes mellitus (HCC)    GERD (gastroesophageal reflux disease)    Hyperlipidemia    Hypertension    Hypothyroidism    Morbid obesity with BMI of 70 and over, adult ([place])    Obesity hypoventilation syndrome (HCC)    OSA (obstructive sleep apnea)    PVC's (premature ventricular contractions)    Recurrent cellulitis of lower leg    LLE, venous insuff   Sepsis ([place]) 11/2012   Secondary to cellulitis   Sleep apnea    Venous insufficiency of leg     Past Medical History, Surgical history, Social history, and Family history were reviewed and updated as appropriate.   Please see review of systems for further details on the patient's review from today.   Objective:   Physical Exam:  There were no vitals taken for this visit.  Physical Exam Constitutional:      General: She is not in acute distress. Musculoskeletal:        General: No deformity.  Neurological:     Mental Status: She is alert and oriented to person, place, and time.     Coordination: Coordination normal.  Psychiatric:        Attention and Perception: Attention and perception normal. She does not perceive auditory or visual hallucinations.        Mood and Affect: Mood normal. Mood is not anxious or depressed. Affect is not labile, blunt, angry or inappropriate.         Speech: Speech normal.        Behavior: Behavior normal.        Thought Content: Thought content normal. Thought content is not paranoid or delusional. Thought content does not include homicidal or suicidal ideation. Thought content does not include homicidal or suicidal plan.        Cognition and Memory: Cognition and memory normal.        Judgment: Judgment normal.     Comments: Insight intact    Lab Review:     Component Value Date/Time   NA 141 11/11/2020 1211   K 4.4 11/11/2020 1211   CL 101 11/11/2020 1211   CO2 25 11/11/2020 1211   GLUCOSE 107 (H) 11/11/2020 1211   GLUCOSE  117 (H) 03/23/2018 1224   BUN 22 11/11/2020 1211   CREATININE 1.00 11/11/2020 1211   CREATININE 1.06 11/14/2016 1050   CALCIUM 9.9 11/11/2020 1211   CALCIUM 9.6 06/26/2007 0000   PROT 6.9 11/11/2020 1211   ALBUMIN 4.1 11/11/2020 1211   AST 14 11/11/2020 1211   ALT 13 11/11/2020 1211   ALKPHOS 82 11/11/2020 1211   BILITOT 0.3 11/11/2020 1211   GFRNONAA 71 11/11/2020 1211   GFRAA 81 11/11/2020 1211       Component Value Date/Time   WBC 5.9 07/25/2018 1234   WBC 7.9 03/23/2018 1224   RBC 5.07 07/25/2018 1234   RBC 5.65 (H) 03/23/2018 1224   HGB 14.8 07/25/2018 1234   HCT 44.8 07/25/2018 1234   PLT 230.0 03/23/2018 1224   MCV 88 07/25/2018 1234   MCH 29.2 07/25/2018 1234   MCH 26.8 04/19/2017 0419   MCHC 33.0 07/25/2018 1234   MCHC 31.6 03/23/2018 1224   RDW 15.6 (H) 07/25/2018 1234   LYMPHSABS 1.4 07/25/2018 1234   MONOABS 0.7 04/16/2017 2351   EOSABS 0.2 07/25/2018 1234   BASOSABS 0.0 07/25/2018 1234    No results found for: POCLITH, LITHIUM   No results found for: PHENYTOIN, PHENOBARB, VALPROATE, CBMZ   .res Assessment: Plan:     Plan:   PDMP reviewed  1. Wellbutrin XL 450mg . Denies seizure history. 2. Zoloft 100mg  daily - take two tablets daily 3. Add Abilify 5mg  - 1/2 daily x 7 days, then one tablet daily.  Read and reviewed note with patient for accuracy.   RTC 4  weeks  Patient advised to contact office with any questions, adverse effects, or acute worsening in signs and symptoms.  Diagnoses and all orders for this visit:  Major depressive disorder, recurrent episode, moderate (HCC) -     ARIPiprazole (ABILIFY) 5 MG tablet; Take 1 tablet (5 mg total) by mouth daily.  Obsessional thoughts  Insomnia, unspecified type    Please see After Visit Summary for patient specific instructions.  Future Appointments  Date Time Provider [place]  06/16/2021  1:00 PM Beck, Lucious Groves, LCSW [facility] None  06/17/2021 12:20 PM Laqueta Linden, MD [facility] None  06/23/2021  1:00 PM Beck, Lucious Groves, LCSW [facility] None  06/30/2021  1:00 PM Beck, Alexandra G, LCSW [facility] None  07/02/2021  9:40 AM Freada Bergeron, MD [facility]  07/16/2021  1:40 PM Juanita Devincent, Berdie Ogren, NP [facility] None    No orders of the defined types were placed in this encounter.   -------------------------------

## 2021-06-16 ENCOUNTER — Ambulatory Visit (INDEPENDENT_AMBULATORY_CARE_PROVIDER_SITE_OTHER): Payer: BC Managed Care – PPO | Admitting: Psychology

## 2021-06-16 DIAGNOSIS — F331 Major depressive disorder, recurrent, moderate: Secondary | ICD-10-CM | POA: Diagnosis not present

## 2021-06-17 ENCOUNTER — Encounter (INDEPENDENT_AMBULATORY_CARE_PROVIDER_SITE_OTHER): Payer: Self-pay | Admitting: Family Medicine

## 2021-06-17 ENCOUNTER — Ambulatory Visit (INDEPENDENT_AMBULATORY_CARE_PROVIDER_SITE_OTHER): Payer: BC Managed Care – PPO | Admitting: Family Medicine

## 2021-06-17 ENCOUNTER — Other Ambulatory Visit: Payer: Self-pay

## 2021-06-17 VITALS — BP 142/77 | HR 70 | Temp 98.5°F | Ht 63.0 in | Wt >= 6400 oz

## 2021-06-17 DIAGNOSIS — Z9189 Other specified personal risk factors, not elsewhere classified: Secondary | ICD-10-CM

## 2021-06-17 DIAGNOSIS — E1169 Type 2 diabetes mellitus with other specified complication: Secondary | ICD-10-CM | POA: Diagnosis not present

## 2021-06-17 DIAGNOSIS — Z6841 Body Mass Index (BMI) 40.0 and over, adult: Secondary | ICD-10-CM

## 2021-06-17 DIAGNOSIS — I10 Essential (primary) hypertension: Secondary | ICD-10-CM | POA: Diagnosis not present

## 2021-06-17 MED ORDER — OZEMPIC (0.25 OR 0.5 MG/DOSE) 2 MG/1.5ML ~~LOC~~ SOPN: 0.5000 mg | PEN_INJECTOR | SUBCUTANEOUS | 0 refills | Status: DC

## 2021-06-19 ENCOUNTER — Other Ambulatory Visit: Payer: Self-pay | Admitting: Family

## 2021-06-19 DIAGNOSIS — E039 Hypothyroidism, unspecified: Secondary | ICD-10-CM

## 2021-06-22 NOTE — Progress Notes (Signed)
Chief Complaint:   OBESITY Alexandra Beck is here to discuss her progress with her obesity treatment plan along with follow-up of her obesity related diagnoses. Alexandra Beck is on practicing portion control and making smarter food choices, such as increasing vegetables and decreasing simple carbohydrates and states she is following her eating plan approximately ?% of the time. Alexandra Beck states she is not currently exercising.  Today's visit was #: 94 Starting weight: 468 lbs Starting date: 04/15/2018 Today's weight: 447 lbs Today's date: 06/17/2021 Total lbs lost to date: 21 Total lbs lost since last in-office visit: 0  Interim History: Alexandra Beck had an argument with her wife prior to her last appointment and had to cancel. Pt is doing better now. She went to see Alexandra Beck last weekend and enjoyed herself down in [place]. She recently started on Abilify. She is not getting all her food in.  Subjective:   1. Type 2 diabetes mellitus with other specified complication, without long-term current use of insulin (HCC) Alexandra Beck is on Ozempic 0.5 mg weekly. She denies GI side effects.  2. Essential hypertension Alexandra Beck BP is well controlled previously. Pt denies chest pain/chest pressure/headache.  3. At risk for deficient intake of food Alexandra Beck is at risk for deficient intake of food due to often skipping meals.  Assessment/Plan:   1. Type 2 diabetes mellitus with other specified complication, without long-term current use of insulin (HCC) Good blood sugar control is important to decrease the likelihood of diabetic complications such as nephropathy, neuropathy, limb loss, blindness, coronary artery disease, and death. Intensive lifestyle modification including diet, exercise and weight loss are the first line of treatment for diabetes.   Refill- Semaglutide,0.25 or 0.'5MG'$ /DOS, (OZEMPIC, 0.25 OR 0.5 MG/DOSE,) 2 MG/1.5ML SOPN; Inject 0.5 mg into the skin once a week.  Dispense: 1.5 mL; Refill: 0  2. Essential  hypertension Alexandra Beck is working on healthy weight loss and exercise to improve blood pressure control. We will watch for signs of hypotension as she continues her lifestyle modifications. Continue current treatment plan.  3. At risk for deficient intake of food Alexandra Beck was given approximately 15 minutes of deficit intake of food prevention counseling today. Alexandra Beck is at risk for eating too few calories based on current food recall. She was encouraged to focus on meeting caloric and protein goals according to her recommended meal plan.    4. Obesity BMI 2  Alexandra Beck is currently in the action stage of change. As such, her goal is to continue with weight loss efforts. She has agreed to the Category 4 Plan.   Exercise goals:  As is  Behavioral modification strategies: increasing lean protein intake, meal planning and cooking strategies, keeping healthy foods in the home, and planning for success.  Alexandra Beck has agreed to follow-up with our clinic in 2 weeks. She was informed of the importance of frequent follow-up visits to maximize her success with intensive lifestyle modifications for her multiple health conditions.   Objective:   Blood pressure (!) 142/77, pulse 70, temperature 98.5 F (36.9 C), height '5\' 3"'$  (1.6 m), weight (!) 447 lb (202.8 kg), SpO2 97 %. Body mass index is 79.18 kg/m.  General: Cooperative, alert, well developed, in no acute distress. HEENT: Conjunctivae and lids unremarkable. Cardiovascular: Regular rhythm.  Lungs: Normal work of breathing. Neurologic: No focal deficits.   Lab Results  Component Value Date   CREATININE 1.00 11/11/2020   BUN 22 11/11/2020   NA 141 11/11/2020   K 4.4 11/11/2020   CL 101  11/11/2020   CO2 25 11/11/2020   Lab Results  Component Value Date   ALT 13 11/11/2020   AST 14 11/11/2020   ALKPHOS 82 11/11/2020   BILITOT 0.3 11/11/2020   Lab Results  Component Value Date   HGBA1C 5.9 (H) 11/11/2020   HGBA1C 5.6 12/17/2019   HGBA1C 5.6  07/04/2019   HGBA1C 5.4 12/04/2018   HGBA1C 5.8 (H) 07/25/2018   Lab Results  Component Value Date   INSULIN 23.6 11/11/2020   INSULIN 17.1 12/17/2019   INSULIN 23.5 07/04/2019   INSULIN 25.8 (H) 12/04/2018   INSULIN 24.7 07/25/2018   Lab Results  Component Value Date   TSH 2.94 01/08/2021   Lab Results  Component Value Date   CHOL 185 11/11/2020   HDL 53 11/11/2020   LDLCALC 103 (H) 11/11/2020   LDLDIRECT 141.0 03/02/2016   TRIG 169 (H) 11/11/2020   CHOLHDL 4 03/23/2018   Lab Results  Component Value Date   VD25OH 30.2 11/11/2020   VD25OH 38.2 12/17/2019   VD25OH 34.6 07/04/2019   Lab Results  Component Value Date   WBC 5.9 07/25/2018   HGB 14.8 07/25/2018   HCT 44.8 07/25/2018   MCV 88 07/25/2018   PLT 230.0 03/23/2018    Attestation Statements:   Reviewed by clinician on day of visit: allergies, medications, problem list, medical history, surgical history, family history, social history, and previous encounter notes.  Coral Ceo, CMA, am acting as transcriptionist for Coralie Common, MD.   I have reviewed the above documentation for accuracy and completeness, and I agree with the above. - Coralie Common, MD

## 2021-06-23 ENCOUNTER — Ambulatory Visit (INDEPENDENT_AMBULATORY_CARE_PROVIDER_SITE_OTHER): Payer: BC Managed Care – PPO | Admitting: Psychology

## 2021-06-23 DIAGNOSIS — F331 Major depressive disorder, recurrent, moderate: Secondary | ICD-10-CM | POA: Diagnosis not present

## 2021-06-25 NOTE — Progress Notes (Deleted)
Cardiology Office Note:    Date:  06/25/2021   ID:  Alexandra Beck, DOB 1981/03/19, MRN [id number]  PCP:  Marrian Salvage, [place] [facility] Cardiologist:  Ena Dawley, MD (Inactive) {   Referring MD: Marrian Salvage,*    History of Present Illness:    Alexandra Beck is a 40 y.o. female with a hx of morbid obesity, OHS/OSA, hypertension, hyperlipidemia, hypothyroidism, anemia, DM, GERD, recurrent cellulitis, venous insufficiency, chronic diastolic CHF, PVCs, acute renal failure in 2014 who presents for follow-up of CHF.  Was seen by Dr. Meda Beck in 11/2019 where she was continuing to work on weight loss. No anginal symptoms.   Today  Past Medical History:  Diagnosis Date   Acute renal failure (ARF) ([place]) 11/2012    multifactorial-likely secondary to ATN in the setting of sepsis and hypotension, and also from rhabdomyolysis   Anemia    Cellulitis    Chronic diastolic CHF (congestive heart failure) (HCC)    Depression    Diabetes mellitus (HCC)    GERD (gastroesophageal reflux disease)    Hyperlipidemia    Hypertension    Hypothyroidism    Morbid obesity with BMI of 70 and over, adult ([place])    Obesity hypoventilation syndrome (HCC)    OSA (obstructive sleep apnea)    PVC's (premature ventricular contractions)    Recurrent cellulitis of lower leg    LLE, venous insuff   Sepsis ([place]) 11/2012   Secondary to cellulitis   Sleep apnea    Venous insufficiency of leg     Past Surgical History:  Procedure Laterality Date   IR FLUORO GUIDE CV MIDLINE PICC RIGHT  03/28/2017   IR US GUIDE VASC ACCESS RIGHT  03/28/2017   WISDOM TOOTH EXTRACTION      Current Medications: No outpatient medications have been marked as taking for the 07/02/21 encounter (Appointment) with Alexandra Bergeron, MD.     Allergies:   Aspirin, Doxycycline, Lisinopril, Niacin and related, and Niaspan [niacin er]   Social History   Socioeconomic History   Marital status: Single     Spouse name: Not on file   Number of children: 0   Years of education: Not on file   Highest education level: Not on file  Occupational History   Occupation: Tree surgeon: [employer]  Tobacco Use   Smoking status: Former    Packs/day: 0.50    Years: 8.00    Pack years: 4.00    Types: Cigarettes    Quit date: 2011    Years since quitting: 11.5   Smokeless tobacco: Never  Vaping Use   Vaping Use: Never used  Substance and Sexual Activity   Alcohol use: No    Alcohol/week: 0.0 standard drinks   Drug use: No   Sexual activity: Not on file  Other Topics Concern   Not on file  Social History Narrative   Librarian, 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  ROS:   Please see the history of present illness.    *** All other systems reviewed and are negative.  EKGs/Labs/Other Studies Reviewed:    The following studies were reviewed today: TTE 2018/07/06: Study Conclusions   - Left ventricle: The cavity size was normal. Wall thickness was at    the upper limits of normal. Systolic function was normal. The    estimated ejection fraction was in the range of 50% to 55%.    Although no diagnostic regional wall motion abnormality was    identified, this possibility cannot be completely excluded on the    basis of this study given the poor acoustic windows.  - Aortic valve: Transvalvular velocity was within the normal range.    There was no  stenosis. There was no regurgitation.  - Mitral valve: There was no regurgitation.  - Right ventricle: Systolic function was normal.  - Tricuspid valve: There was no significant regurgitation.  - Pulmonic valve: There was no significant regurgitation.  - Poor acoustic windows. Echo contrast used to improve    visualization.   Impressions:   - Technically difficult study due to poor acoustic windows, even    with use of echo contrast. Appears unchanged from prior study.   EKG:  EKG is *** ordered today.  The ekg ordered today demonstrates ***  Recent Labs: 11/11/2020: ALT 13; BUN 22; Creatinine, Ser 1.00; Potassium 4.4; Sodium 141 01/08/2021: TSH 2.94  Recent Lipid Panel    Component Value Date/Time   CHOL 185 11/11/2020 1211   TRIG 169 (H) 11/11/2020 1211   HDL 53 11/11/2020 1211   CHOLHDL 4 03/23/2018 1224   VLDL 29.6 03/23/2018 1224   LDLCALC 103 (H) 11/11/2020 1211   LDLDIRECT 141.0 03/02/2016 1101     Risk Assessment/Calculations:   {Does this patient have ATRIAL FIBRILLATION?[phone]}       Physical Exam:    VS:  There were no vitals taken for this visit.    Wt Readings from Last 3 Encounters:  06/17/21 (!) 447 lb (202.8 kg)  04/29/21 (!) 439 lb (199.1 kg)  03/08/21 (!) 432 lb (196 kg)     GEN: *** Well nourished, well developed in no acute distress HEENT: Normal NECK: No JVD; No carotid bruits LYMPHATICS: No lymphadenopathy CARDIAC: ***RRR, no murmurs, rubs, gallops RESPIRATORY:  Clear to auscultation without rales, wheezing or rhonchi  ABDOMEN: Soft, non-tender, non-distended MUSCULOSKELETAL:  No edema; No deformity  SKIN: Warm and dry NEUROLOGIC:  Alert and oriented x 3 PSYCHIATRIC:  Normal affect   ASSESSMENT:    No diagnosis found. PLAN:    In order of problems listed above:  #Chronic Diastolic Heart Failure: TTE with LVEF 50-55%. Currently with **** -Continue torsemide '40mg'$  daily -Continue losartan '100mg'$  daily -Continue metop succinate  '50mg'$  XL daily -Continue spiro '25mg'$  daily -?SGLT2i  #HTN: -Continue losartan '100mg'$  daily -Continue metop succinate '50mg'$  XL daily -Continue spiro '25mg'$  daily  #Frequent PVCs: Resolved with metop. -Continue metop succinate '50mg'$  XL daily  #Morbid Obesity: Joined weight loss group and is down **** pounds. -Ozempic 0.'5mg'$  q week  #HLD: -Continue crestor '10mg'$  daily  {Are you ordering a CV Procedure (e.g. stress test, cath, DCCV, TEE, etc)?   Press F2        :[id number]    Medication Adjustments/Labs and Tests Ordered: Current medicines are reviewed at length with the patient today.  Concerns regarding medicines are outlined above.  No orders of the defined types  were placed in this encounter.  No orders of the defined types were placed in this encounter.   There are no Patient Instructions on file for this visit.   Signed, Alexandra Bergeron, MD  06/25/2021 7:55 AM    [place]

## 2021-06-30 ENCOUNTER — Ambulatory Visit (INDEPENDENT_AMBULATORY_CARE_PROVIDER_SITE_OTHER): Payer: BC Managed Care – PPO | Admitting: Psychology

## 2021-06-30 DIAGNOSIS — F331 Major depressive disorder, recurrent, moderate: Secondary | ICD-10-CM | POA: Diagnosis not present

## 2021-07-02 ENCOUNTER — Other Ambulatory Visit: Payer: Self-pay | Admitting: Adult Health

## 2021-07-02 ENCOUNTER — Ambulatory Visit: Payer: BC Managed Care – PPO | Admitting: Cardiology

## 2021-07-02 DIAGNOSIS — F331 Major depressive disorder, recurrent, moderate: Secondary | ICD-10-CM

## 2021-07-02 DIAGNOSIS — F428 Other obsessive-compulsive disorder: Secondary | ICD-10-CM

## 2021-07-07 ENCOUNTER — Ambulatory Visit (INDEPENDENT_AMBULATORY_CARE_PROVIDER_SITE_OTHER): Payer: BC Managed Care – PPO | Admitting: Psychology

## 2021-07-07 DIAGNOSIS — F331 Major depressive disorder, recurrent, moderate: Secondary | ICD-10-CM | POA: Diagnosis not present

## 2021-07-08 ENCOUNTER — Encounter (INDEPENDENT_AMBULATORY_CARE_PROVIDER_SITE_OTHER): Payer: Self-pay | Admitting: Family Medicine

## 2021-07-08 ENCOUNTER — Ambulatory Visit (INDEPENDENT_AMBULATORY_CARE_PROVIDER_SITE_OTHER): Payer: BC Managed Care – PPO | Admitting: Family Medicine

## 2021-07-08 ENCOUNTER — Other Ambulatory Visit: Payer: Self-pay | Admitting: Pulmonary Disease

## 2021-07-08 ENCOUNTER — Other Ambulatory Visit: Payer: Self-pay

## 2021-07-08 VITALS — BP 149/74 | HR 74 | Temp 98.3°F | Ht 63.0 in | Wt >= 6400 oz

## 2021-07-08 DIAGNOSIS — I152 Hypertension secondary to endocrine disorders: Secondary | ICD-10-CM | POA: Diagnosis not present

## 2021-07-08 DIAGNOSIS — Z9189 Other specified personal risk factors, not elsewhere classified: Secondary | ICD-10-CM

## 2021-07-08 DIAGNOSIS — E1165 Type 2 diabetes mellitus with hyperglycemia: Secondary | ICD-10-CM

## 2021-07-08 DIAGNOSIS — Z6841 Body Mass Index (BMI) 40.0 and over, adult: Secondary | ICD-10-CM

## 2021-07-08 DIAGNOSIS — E1159 Type 2 diabetes mellitus with other circulatory complications: Secondary | ICD-10-CM

## 2021-07-08 MED ORDER — TIRZEPATIDE 2.5 MG/0.5ML ~~LOC~~ SOAJ: 2.5000 mg | SUBCUTANEOUS | 0 refills | Status: DC

## 2021-07-08 MED ORDER — LOSARTAN POTASSIUM 100 MG PO TABS: 100.0000 mg | ORAL_TABLET | Freq: Every day | ORAL | 0 refills | Status: DC

## 2021-07-08 NOTE — Telephone Encounter (Signed)
Dr. Alva, please advise on med refill. 

## 2021-07-08 NOTE — Telephone Encounter (Signed)
Attempted to call pt to get her scheduled for a f/u appt with either Dr. Elsworth Soho or APP in October 2022 but unable to reach. Left pt a detailed message for her to call the office so we can get appt scheduled.

## 2021-07-08 NOTE — Telephone Encounter (Signed)
Refill sent She needs FU OV in October please with APP/ me

## 2021-07-09 NOTE — Progress Notes (Signed)
Chief Complaint:   OBESITY Alexandra Beck is here to discuss her progress with her obesity treatment plan along with follow-up of her obesity related diagnoses. Alexandra Beck is on the Category 4 Plan and states she is following her eating plan approximately 60% of the time. Alexandra Beck states she has not been exercising.  Today's visit was #: 55 Starting weight: 468 lbs Starting date: 04/15/2018 Today's weight: 450 lbs Today's date: 07/08/2021 Total lbs lost to date: 18 Total lbs lost since last in-office visit: 0  Interim History: Alexandra Beck has been doing more food on the Category 4 plan and has gotten back into a routine. She was craving comfort food one day and she ate ramen noodles and chicken. Alexandra Beck went to a concert in [place] yesterday. She realizes that she has some understanding and awareness of the meal plan skeleton.  Subjective:   1. Type 2 diabetes mellitus with hyperglycemia, without long-term current use of insulin (HCC) Medications reviewed. Alexandra Beck is on Ozempic, but it is on backorder. She denies side effects of the GLP-1.  Lab Results  Component Value Date   HGBA1C 5.9 (H) 11/11/2020   HGBA1C 5.6 12/17/2019   HGBA1C 5.6 07/04/2019   Lab Results  Component Value Date   MICROALBUR 8.05 (H) 06/26/2007   LDLCALC 103 (H) 11/11/2020   CREATININE 1.00 11/11/2020   Lab Results  Component Value Date   INSULIN 23.6 11/11/2020   INSULIN 17.1 12/17/2019   INSULIN 23.5 07/04/2019   INSULIN 25.8 (H) 12/04/2018   INSULIN 24.7 07/25/2018   2. Hypertension associated with diabetes (Alexandra Beck) Alexandra Beck's blood pressure is slightly elevated today. Cardiovascular ROS: negative for chest pain, chest pressure, or headaches.  BP Readings from Last 3 Encounters:  07/08/21 (!) 149/74  06/17/21 (!) 142/77  04/29/21 (!) 144/80   Lab Results  Component Value Date   CREATININE 1.00 11/11/2020   CREATININE 0.99 03/26/2020   CREATININE 0.75 12/17/2019   3. At risk for side effect of medication Alexandra Beck is  at risk for side effect due to starting a new medication Mounjaro.  Assessment/Plan:   1. Type 2 diabetes mellitus with hyperglycemia, without long-term current use of insulin (HCC) Alexandra Beck agrees to stop Ozempic and to start [facility]. Good blood sugar control is important to decrease the likelihood of diabetic complications such as nephropathy, neuropathy, limb loss, blindness, coronary artery disease, and death. Intensive lifestyle modification including diet, exercise and weight loss are the first line of treatment for diabetes.   - tirzepatide [facility]) 2.5 MG/0.5ML Pen; Inject 2.5 mg into the skin once a week.  Dispense: 2 mL; Refill: 0  2. Hypertension associated with diabetes (Alexandra Beck) [place] agrees to continue her current medications. She is working on healthy weight loss and exercise to improve blood pressure control. We will watch for signs of hypotension as she continues her lifestyle modifications. We will follow up her blood pressure at her next appointment.  3. At risk for side effect of medication Alexandra Beck was given approximately 15 minutes of drug side effect counseling today.  We discussed side effect possibility and risk versus benefits. Alexandra Beck agreed to the medication and will contact this office if these side effects are intolerable.  Repetitive spaced learning was employed today to elicit superior memory formation and behavioral change.   4. Obesity BMI 82.3 Alexandra Beck is currently in the action stage of change. As such, her goal is to continue with weight loss efforts. She has agreed to the Category 4 Plan.   Exercise goals:  No exercise has been prescribed at this time.  Behavioral modification strategies: increasing lean protein intake, meal planning and cooking strategies, keeping healthy foods in the home, and planning for success.  Alexandra Beck has agreed to follow-up with our clinic in 3 weeks. She was informed of the importance of frequent follow-up visits to maximize her success with  intensive lifestyle modifications for her multiple health conditions.   Objective:   Blood pressure (!) 149/74, pulse 74, temperature 98.3 F (36.8 C), height '5\' 3"'$  (1.6 m), weight (!) 450 lb (204.1 kg), SpO2 93 %. Body mass index is 79.71 kg/m.  General: Cooperative, alert, well developed, in no acute distress. HEENT: Conjunctivae and lids unremarkable. Cardiovascular: Regular rhythm.  Lungs: Normal work of breathing. Neurologic: No focal deficits.   Lab Results  Component Value Date   CREATININE 1.00 11/11/2020   BUN 22 11/11/2020   NA 141 11/11/2020   K 4.4 11/11/2020   CL 101 11/11/2020   CO2 25 11/11/2020   Lab Results  Component Value Date   ALT 13 11/11/2020   AST 14 11/11/2020   ALKPHOS 82 11/11/2020   BILITOT 0.3 11/11/2020   Lab Results  Component Value Date   HGBA1C 5.9 (H) 11/11/2020   HGBA1C 5.6 12/17/2019   HGBA1C 5.6 07/04/2019   HGBA1C 5.4 12/04/2018   HGBA1C 5.8 (H) 07/25/2018   Lab Results  Component Value Date   INSULIN 23.6 11/11/2020   INSULIN 17.1 12/17/2019   INSULIN 23.5 07/04/2019   INSULIN 25.8 (H) 12/04/2018   INSULIN 24.7 07/25/2018   Lab Results  Component Value Date   TSH 2.94 01/08/2021   Lab Results  Component Value Date   CHOL 185 11/11/2020   HDL 53 11/11/2020   LDLCALC 103 (H) 11/11/2020   LDLDIRECT 141.0 03/02/2016   TRIG 169 (H) 11/11/2020   CHOLHDL 4 03/23/2018   Lab Results  Component Value Date   VD25OH 30.2 11/11/2020   VD25OH 38.2 12/17/2019   VD25OH 34.6 07/04/2019   Lab Results  Component Value Date   WBC 5.9 07/25/2018   HGB 14.8 07/25/2018   HCT 44.8 07/25/2018   MCV 88 07/25/2018   PLT 230.0 03/23/2018   No results found for: IRON, TIBC, FERRITIN  Attestation Statements:   Reviewed by clinician on day of visit: allergies, medications, problem list, medical history, surgical history, family history, social history, and previous encounter notes.  IMarcille Blanco, CMA, am acting as  transcriptionist for Coralie Common, MD  I have reviewed the above documentation for accuracy and completeness, and I agree with the above. - Coralie Common, MD

## 2021-07-16 ENCOUNTER — Ambulatory Visit: Payer: BC Managed Care – PPO | Admitting: Adult Health

## 2021-07-21 ENCOUNTER — Ambulatory Visit (INDEPENDENT_AMBULATORY_CARE_PROVIDER_SITE_OTHER): Payer: BC Managed Care – PPO | Admitting: Psychology

## 2021-07-21 DIAGNOSIS — F331 Major depressive disorder, recurrent, moderate: Secondary | ICD-10-CM

## 2021-07-28 ENCOUNTER — Ambulatory Visit (INDEPENDENT_AMBULATORY_CARE_PROVIDER_SITE_OTHER): Payer: BC Managed Care – PPO | Admitting: Psychology

## 2021-07-28 DIAGNOSIS — F331 Major depressive disorder, recurrent, moderate: Secondary | ICD-10-CM | POA: Diagnosis not present

## 2021-07-30 ENCOUNTER — Encounter: Payer: Self-pay | Admitting: Adult Health

## 2021-07-30 ENCOUNTER — Ambulatory Visit (INDEPENDENT_AMBULATORY_CARE_PROVIDER_SITE_OTHER): Payer: BC Managed Care – PPO | Admitting: Adult Health

## 2021-07-30 ENCOUNTER — Other Ambulatory Visit: Payer: Self-pay

## 2021-07-30 DIAGNOSIS — G4700 Insomnia, unspecified: Secondary | ICD-10-CM

## 2021-07-30 DIAGNOSIS — F428 Other obsessive-compulsive disorder: Secondary | ICD-10-CM | POA: Diagnosis not present

## 2021-07-30 DIAGNOSIS — F331 Major depressive disorder, recurrent, moderate: Secondary | ICD-10-CM

## 2021-07-30 MED ORDER — SERTRALINE HCL 100 MG PO TABS: 100.0000 mg | ORAL_TABLET | Freq: Two times a day (BID) | ORAL | 5 refills | Status: DC

## 2021-07-30 NOTE — Progress Notes (Signed)
MARCENA MATCHETT [id number] [birth year] 40 y.o.  Subjective:   Patient ID:  Alexandra Beck is a 40 y.o. (DOB January 10, 1981) female.  Chief Complaint: No chief complaint on file.   HPI Alexandra Beck presents to the office today for follow-up of insomnia, obsessional thoughts, and depression.  Describes mood today as "ok". Pleasant. Decreased tearfulness - "more isolated". Mood symptoms - reports decreased depression - "still there", anxiety - "subsided some" and irritability - "not like I used to be, more situational". Less obsessions. Not dwelling on things as much. Stating "I'm feeling better". Feels more "emotionally" capable. Feels like combination of medications and working with therapist is helpful. Trying to stay consistent with her day. She and wife getting along better. Seeing therapist - Bambi Cottle for therapy - has started EMDR. Varying interest and motivation. Taking medications as prescribed.  Energy levels low. Active, does not have a regular exercise routine.  Enjoys some usual interests and activities. Married. Lives with wife and 2 cats. Spending time with family. Gaming online with friends. Appetite adequate. Weight gain. Working with a weight management clinic.  Sleeping well most nights. Averages 7 to 8 hours. Having vivid dreams - nothing troublesome. Focus and concentration stable. Completing tasks. Managing minimal aspects of household. Works 12 to 9 - at [employer].  Denies SI or HI.  Denies AH or VH. Seeing Bambi Cottle for therapy.  Previous medication trials: Trialed on Wellbutrin at age 40, Trintellix-weird dreams.    [place] [place] Office Visit from 04/17/2018 in [place] Office Visit from 02/27/2018 in [facility] Office Visit from 10/11/2017 in [facility]  PHQ-2 Total Score 6 0 0  PHQ-9 Total Score 23 -- --        Review of Systems:  Review of Systems   Musculoskeletal:  Negative for gait problem.  Neurological:  Negative for tremors.  Psychiatric/Behavioral:         Please refer to HPI   Medications: I have reviewed the patient's current medications.  Current Outpatient Medications  Medication Sig Dispense Refill   ARIPiprazole (ABILIFY) 5 MG tablet Take 1 tablet (5 mg total) by mouth daily. 30 tablet 5   buPROPion (WELLBUTRIN XL) 150 MG 24 hr tablet Take three tablets every morning. 90 tablet 5   levothyroxine (SYNTHROID) 150 MCG tablet TAKE 1 TABLET(150 MCG) BY MOUTH DAILY BEFORE AND BREAKFAST 90 tablet 2   losartan (COZAAR) 100 MG tablet Take 1 tablet (100 mg total) by mouth daily. Please schedule appt for future refills. Thank you 90 tablet 0   metFORMIN (GLUCOPHAGE) 500 MG tablet TAKE 1 TABLET(500 MG) BY MOUTH DAILY 90 tablet 3   metoprolol succinate (TOPROL-XL) 50 MG 24 hr tablet TAKE 1 TABLET BY MOUTH DAILY WITH OR IMMEDIATELY FOLLOWING A MEAL 90 tablet 3   modafinil (PROVIGIL) 100 MG tablet TAKE 1 TABLET BY MOUTH around 2 pm 30 tablet 5   modafinil (PROVIGIL) 200 MG tablet TAKE 1 TABLET(200 MG) BY MOUTH DAILY AT 9 AM 60 tablet 1   rosuvastatin (CRESTOR) 10 MG tablet TAKE 1 TABLET(10 MG) BY MOUTH DAILY 90 tablet 3   sertraline (ZOLOFT) 100 MG tablet Take 1 tablet (100 mg total) by mouth 2 (two) times daily. 60 tablet 5   spironolactone (ALDACTONE) 25 MG tablet Take 1 tablet (25 mg total) by mouth daily. Pt must make appt with provider before more refills - 2nd  attempt 90 tablet 0   tirzepatide (MOUNJARO) 2.5 MG/0.5ML Pen Inject 2.5 mg into the skin once a week. 2 mL 0   torsemide (DEMADEX) 20 MG tablet Take 2 tablets (40 mg total) by mouth daily. Pt must make appt with provider before more refills - 2nd attempt 180 tablet 0   traMADol (ULTRAM) 50 MG tablet Take 1-2 tablets (50-100 mg total) by mouth every 8 (eight) hours as needed. 20 tablet 0   No current facility-administered medications for this visit.    Medication Side  Effects: None  Allergies:  Allergies  Allergen Reactions   Aspirin Hives and Swelling    REACTION: throat swelling, hives Other reaction(s): Unknown   Doxycycline Other (See Comments)    Abdominal pain Other reaction(s): Unknown   Lisinopril Cough   Niacin And Related     Other reaction(s): Unknown   Niaspan [Niacin Er]     Caused flushing    Past Medical History:  Diagnosis Date   Acute renal failure (ARF) ([place]) 11/2012    multifactorial-likely secondary to ATN in the setting of sepsis and hypotension, and also from rhabdomyolysis   Anemia    Cellulitis    Chronic diastolic CHF (congestive heart failure) (HCC)    Depression    Diabetes mellitus (HCC)    GERD (gastroesophageal reflux disease)    Hyperlipidemia    Hypertension    Hypothyroidism    Morbid obesity with BMI of 70 and over, adult ([place])    Obesity hypoventilation syndrome (HCC)    OSA (obstructive sleep apnea)    PVC's (premature ventricular contractions)    Recurrent cellulitis of lower leg    LLE, venous insuff   Sepsis ([place]) 11/2012   Secondary to cellulitis   Sleep apnea    Venous insufficiency of leg     Past Medical History, Surgical history, Social history, and Family history were reviewed and updated as appropriate.   Please see review of systems for further details on the patient's review from today.   Objective:   Physical Exam:  There were no vitals taken for this visit.  Physical Exam Constitutional:      General: She is not in acute distress. Musculoskeletal:        General: No deformity.  Neurological:     Mental Status: She is alert and oriented to person, place, and time.     Coordination: Coordination normal.  Psychiatric:        Attention and Perception: Attention and perception normal. She does not perceive auditory or visual hallucinations.        Mood and Affect: Mood normal. Mood is not anxious or depressed. Affect is not labile, blunt, angry or inappropriate.         Speech: Speech normal.        Behavior: Behavior normal.        Thought Content: Thought content normal. Thought content is not paranoid or delusional. Thought content does not include homicidal or suicidal ideation. Thought content does not include homicidal or suicidal plan.        Cognition and Memory: Cognition and memory normal.        Judgment: Judgment normal.     Comments: Insight intact    Lab Review:     Component Value Date/Time   NA 141 11/11/2020 1211   K 4.4 11/11/2020 1211   CL 101 11/11/2020 1211   CO2 25 11/11/2020 1211   GLUCOSE 107 (H) 11/11/2020 1211   GLUCOSE 117 (H)  03/23/2018 1224   BUN 22 11/11/2020 1211   CREATININE 1.00 11/11/2020 1211   CREATININE 1.06 11/14/2016 1050   CALCIUM 9.9 11/11/2020 1211   CALCIUM 9.6 06/26/2007 0000   PROT 6.9 11/11/2020 1211   ALBUMIN 4.1 11/11/2020 1211   AST 14 11/11/2020 1211   ALT 13 11/11/2020 1211   ALKPHOS 82 11/11/2020 1211   BILITOT 0.3 11/11/2020 1211   GFRNONAA 71 11/11/2020 1211   GFRAA 81 11/11/2020 1211       Component Value Date/Time   WBC 5.9 07/25/2018 1234   WBC 7.9 03/23/2018 1224   RBC 5.07 07/25/2018 1234   RBC 5.65 (H) 03/23/2018 1224   HGB 14.8 07/25/2018 1234   HCT 44.8 07/25/2018 1234   PLT 230.0 03/23/2018 1224   MCV 88 07/25/2018 1234   MCH 29.2 07/25/2018 1234   MCH 26.8 04/19/2017 0419   MCHC 33.0 07/25/2018 1234   MCHC 31.6 03/23/2018 1224   RDW 15.6 (H) 07/25/2018 1234   LYMPHSABS 1.4 07/25/2018 1234   MONOABS 0.7 04/16/2017 2351   EOSABS 0.2 07/25/2018 1234   BASOSABS 0.0 07/25/2018 1234    No results found for: POCLITH, LITHIUM   No results found for: PHENYTOIN, PHENOBARB, VALPROATE, CBMZ   .res Assessment: Plan:    Plan:   PDMP reviewed  1. Wellbutrin XL '450mg'$ . Denies seizure history. 2. Zoloft '100mg'$  daily - take two tablets daily 3. Add Abilify '5mg'$  - one tablet daily.  Read and reviewed note with patient for accuracy.   RTC 4 weeks  Patient advised to  contact office with any questions, adverse effects, or acute worsening in signs and symptoms.  Discussed potential metabolic side effects associated with atypical antipsychotics, as well as potential risk for movement side effects. Advised pt to contact office if movement side effects occur.     Diagnoses and all orders for this visit:  Major depressive disorder, recurrent episode, moderate (HCC) -     sertraline (ZOLOFT) 100 MG tablet; Take 1 tablet (100 mg total) by mouth 2 (two) times daily.  Obsessional thoughts -     sertraline (ZOLOFT) 100 MG tablet; Take 1 tablet (100 mg total) by mouth 2 (two) times daily.  Insomnia, unspecified type    Please see After Visit Summary for patient specific instructions.  Future Appointments  Date Time Provider [place]  08/04/2021  1:00 PM Cottle, Lucious Groves, LCSW [facility] None  08/05/2021  2:20 PM Laqueta Linden, MD [facility] None  08/11/2021  1:00 PM Cottle, Bambi G, LCSW [facility] None  08/18/2021  1:00 PM Cottle, Bambi G, LCSW [facility] None  08/25/2021  1:00 PM Cottle, Bambi G, LCSW [facility] None  09/01/2021  1:00 PM Cottle, Bambi G, LCSW [facility] None  09/08/2021  1:00 PM Cottle, Bambi G, LCSW [facility] None  09/15/2021  1:00 PM Cottle, Bambi G, LCSW [facility] None  09/22/2021  1:00 PM Cottle, Bambi G, LCSW [facility] None    No orders of the defined types were placed in this encounter.   -------------------------------

## 2021-08-04 ENCOUNTER — Ambulatory Visit (INDEPENDENT_AMBULATORY_CARE_PROVIDER_SITE_OTHER): Payer: BC Managed Care – PPO | Admitting: Psychology

## 2021-08-04 DIAGNOSIS — F331 Major depressive disorder, recurrent, moderate: Secondary | ICD-10-CM

## 2021-08-05 ENCOUNTER — Other Ambulatory Visit: Payer: Self-pay

## 2021-08-05 ENCOUNTER — Ambulatory Visit (INDEPENDENT_AMBULATORY_CARE_PROVIDER_SITE_OTHER): Payer: BC Managed Care – PPO | Admitting: Family Medicine

## 2021-08-05 ENCOUNTER — Encounter (INDEPENDENT_AMBULATORY_CARE_PROVIDER_SITE_OTHER): Payer: Self-pay | Admitting: Family Medicine

## 2021-08-05 VITALS — BP 121/54 | HR 73 | Temp 98.6°F | Ht 63.0 in | Wt >= 6400 oz

## 2021-08-05 DIAGNOSIS — Z6841 Body Mass Index (BMI) 40.0 and over, adult: Secondary | ICD-10-CM | POA: Diagnosis not present

## 2021-08-05 DIAGNOSIS — F3289 Other specified depressive episodes: Secondary | ICD-10-CM

## 2021-08-05 DIAGNOSIS — E1165 Type 2 diabetes mellitus with hyperglycemia: Secondary | ICD-10-CM

## 2021-08-05 DIAGNOSIS — Z9189 Other specified personal risk factors, not elsewhere classified: Secondary | ICD-10-CM

## 2021-08-05 MED ORDER — TIRZEPATIDE 2.5 MG/0.5ML ~~LOC~~ SOAJ: 2.5000 mg | SUBCUTANEOUS | 0 refills | Status: DC

## 2021-08-06 NOTE — Progress Notes (Signed)
Chief Complaint:   OBESITY Alexandra Beck is here to discuss her progress with her obesity treatment plan along with follow-up of her obesity related diagnoses. Alexandra Beck is on the Category 4 Plan and states she is following her eating plan approximately 30% of the time. Alexandra Beck states she is not currently exercising.  Today's visit was #: 72 Starting weight: 468 lbs Starting date: 04/15/2018 Today's weight: 466 lbs Today's date: 08/05/2021 Total lbs lost to date: 2 Total lbs lost since last in-office visit: 0  Interim History: Alexandra Beck has struggled the last few weeks. She is really struggling emotionally with anhedonia. Pt realizes she can't muster energy to eat throughout the day. She understands that she isn't eating enough.  Subjective:   1. Type 2 diabetes mellitus with hyperglycemia, without long-term current use of insulin (Alexandra Beck) Alexandra Beck couldn't get Mounjaro. Her last A1c was 5.9 and insulin level 23.6.  2. Other depression She started EMDR and is now on Zoloft, Abilify, and Wellbutrin. Pt denies suicidal or homicidal ideations.   3. At risk for side effect of medication Alexandra Beck is at risk for side effects of medication due to starting [facility].  Assessment/Plan:   1. Type 2 diabetes mellitus with hyperglycemia, without long-term current use of insulin (HCC) Good blood sugar control is important to decrease the likelihood of diabetic complications such as nephropathy, neuropathy, limb loss, blindness, coronary artery disease, and death. Intensive lifestyle modification including diet, exercise and weight loss are the first line of treatment for diabetes.   Refill- tirzepatide (MOUNJARO) 2.5 MG/0.5ML Pen; Inject 2.5 mg into the skin once a week.  Dispense: 2 mL; Refill: 0  2. Other depression Behavior modification techniques were discussed today to help Alexandra Beck deal with her emotional/non-hunger eating behaviors.  Orders and follow up as documented in patient record. Follow up at next appt. Pt  is to continue with current providers.  3. At risk for side effect of medication Alexandra Beck was given approximately 15 minutes of drug side effect counseling today.  We discussed side effect possibility and risk versus benefits. Alexandra Beck agreed to the medication and will contact this office if these side effects are intolerable.  Repetitive spaced learning was employed today to elicit superior memory formation and behavioral change.  4. Obesity with current BMI of 82.7  Alexandra Beck is currently in the action stage of change. As such, her goal is to continue with weight loss efforts. She has agreed to practicing portion control and making smarter food choices, such as increasing vegetables and decreasing simple carbohydrates.   Exercise goals: No exercise has been prescribed at this time.  Behavioral modification strategies: increasing lean protein intake, meal planning and cooking strategies, and keeping healthy foods in the home.  Alexandra Beck has agreed to follow-up with our clinic in 2-3 weeks. She was informed of the importance of frequent follow-up visits to maximize her success with intensive lifestyle modifications for her multiple health conditions.   Objective:   Blood pressure (!) 121/54, pulse 73, temperature 98.6 F (37 C), height '5\' 3"'$  (1.6 m), weight (!) 466 lb (211.4 kg), last menstrual period 07/08/2021, SpO2 95 %. Body mass index is 82.55 kg/m.  General: Cooperative, alert, well developed, in no acute distress. HEENT: Conjunctivae and lids unremarkable. Cardiovascular: Regular rhythm.  Lungs: Normal work of breathing. Neurologic: No focal deficits.   Lab Results  Component Value Date   CREATININE 1.00 11/11/2020   BUN 22 11/11/2020   NA 141 11/11/2020   K 4.4 11/11/2020  CL 101 11/11/2020   CO2 25 11/11/2020   Lab Results  Component Value Date   ALT 13 11/11/2020   AST 14 11/11/2020   ALKPHOS 82 11/11/2020   BILITOT 0.3 11/11/2020   Lab Results  Component Value Date    HGBA1C 5.9 (H) 11/11/2020   HGBA1C 5.6 12/17/2019   HGBA1C 5.6 07/04/2019   HGBA1C 5.4 12/04/2018   HGBA1C 5.8 (H) 07/25/2018   Lab Results  Component Value Date   INSULIN 23.6 11/11/2020   INSULIN 17.1 12/17/2019   INSULIN 23.5 07/04/2019   INSULIN 25.8 (H) 12/04/2018   INSULIN 24.7 07/25/2018   Lab Results  Component Value Date   TSH 2.94 01/08/2021   Lab Results  Component Value Date   CHOL 185 11/11/2020   HDL 53 11/11/2020   LDLCALC 103 (H) 11/11/2020   LDLDIRECT 141.0 03/02/2016   TRIG 169 (H) 11/11/2020   CHOLHDL 4 03/23/2018   Lab Results  Component Value Date   VD25OH 30.2 11/11/2020   VD25OH 38.2 12/17/2019   VD25OH 34.6 07/04/2019   Lab Results  Component Value Date   WBC 5.9 07/25/2018   HGB 14.8 07/25/2018   HCT 44.8 07/25/2018   MCV 88 07/25/2018   PLT 230.0 03/23/2018    Attestation Statements:   Reviewed by clinician on day of visit: allergies, medications, problem list, medical history, surgical history, family history, social history, and previous encounter notes.  Coral Ceo, CMA, am acting as transcriptionist for Coralie Common, MD.  I have reviewed the above documentation for accuracy and completeness, and I agree with the above. - Coralie Common, MD

## 2021-08-11 ENCOUNTER — Ambulatory Visit (INDEPENDENT_AMBULATORY_CARE_PROVIDER_SITE_OTHER): Payer: BC Managed Care – PPO | Admitting: Psychology

## 2021-08-11 DIAGNOSIS — F331 Major depressive disorder, recurrent, moderate: Secondary | ICD-10-CM | POA: Diagnosis not present

## 2021-08-18 ENCOUNTER — Ambulatory Visit (INDEPENDENT_AMBULATORY_CARE_PROVIDER_SITE_OTHER): Payer: BC Managed Care – PPO | Admitting: Psychology

## 2021-08-18 DIAGNOSIS — F331 Major depressive disorder, recurrent, moderate: Secondary | ICD-10-CM

## 2021-08-23 ENCOUNTER — Encounter (INDEPENDENT_AMBULATORY_CARE_PROVIDER_SITE_OTHER): Payer: Self-pay | Admitting: Family Medicine

## 2021-08-23 ENCOUNTER — Other Ambulatory Visit: Payer: Self-pay

## 2021-08-23 ENCOUNTER — Ambulatory Visit (INDEPENDENT_AMBULATORY_CARE_PROVIDER_SITE_OTHER): Payer: BC Managed Care – PPO | Admitting: Family Medicine

## 2021-08-23 VITALS — BP 122/64 | HR 71 | Temp 98.3°F | Ht 63.0 in | Wt >= 6400 oz

## 2021-08-23 DIAGNOSIS — Z6841 Body Mass Index (BMI) 40.0 and over, adult: Secondary | ICD-10-CM

## 2021-08-23 DIAGNOSIS — I5032 Chronic diastolic (congestive) heart failure: Secondary | ICD-10-CM | POA: Diagnosis not present

## 2021-08-23 DIAGNOSIS — E1165 Type 2 diabetes mellitus with hyperglycemia: Secondary | ICD-10-CM

## 2021-08-23 DIAGNOSIS — Z9189 Other specified personal risk factors, not elsewhere classified: Secondary | ICD-10-CM

## 2021-08-23 MED ORDER — SPIRONOLACTONE 25 MG PO TABS: 25.0000 mg | ORAL_TABLET | Freq: Every day | ORAL | 0 refills | Status: DC

## 2021-08-23 MED ORDER — TORSEMIDE 20 MG PO TABS: 40.0000 mg | ORAL_TABLET | Freq: Every day | ORAL | 0 refills | Status: DC

## 2021-08-24 NOTE — Progress Notes (Signed)
Chief Complaint:   OBESITY Alexandra Beck is here to discuss her progress with her obesity treatment plan along with follow-up of her obesity related diagnoses. Alexandra Beck is on the Category 4 Plan and states she is following her eating plan approximately 75% of the time. Alexandra Beck states she is not exercising regularly.  Today's visit was #: 37 Starting weight: 468 lbs Starting date: 04/15/2018 Today's weight: 465 lbs Today's date: 08/23/2021 Total lbs lost to date: 3 lbs Total lbs lost since last in-office visit: 1 lb  Interim History: Alexandra Beck has been very diligent about taking in the Category 4 meal plan and following calories and protein amount.  She is feeling very satisfied with the 1 pound loss.  She is planning on going to [facility] the second weekend of October.  Her 3 year wedding anniversary is tomorrow.  She and her wife are going to [place].  Subjective:   1. Type 2 diabetes mellitus with hyperglycemia, without long-term current use of insulin (HCC) Alexandra Beck is on metformin.  She was given a prescription for [facility].  2. Chronic diastolic CHF (congestive heart failure) [facility]) She sees Cardiology (her cardiologist left the practice).  She is running out of her medications and no refills will be given without an office visit.  3. At risk for side effect of medication Ioanna is at risk for side effect of medication due to starting [facility].  Assessment/Plan:   1. Type 2 diabetes mellitus with hyperglycemia, without long-term current use of insulin (HCC) Good blood sugar control is important to decrease the likelihood of diabetic complications such as nephropathy, neuropathy, limb loss, blindness, coronary artery disease, and death. Intensive lifestyle modification including diet, exercise and weight loss are the first line of treatment for diabetes.  She will start [facility] and continue metformin.  2. Chronic diastolic CHF (congestive heart failure) (HCC) Disease process and medications  reviewed with an emphasis on salt restriction, regular exercise, and regular weight monitoring.  Refill spironolactone 25 mg daily and torsemide 40 mg daily, as per below.    - Refill spironolactone (ALDACTONE) 25 MG tablet; Take 1 tablet (25 mg total) by mouth daily.  Dispense: 90 tablet; Refill: 0 - Refill torsemide (DEMADEX) 20 MG tablet; Take 2 tablets (40 mg total) by mouth daily.  Dispense: 180 tablet; Refill: 0  3. At risk for side effect of medication [name] was given approximately 15 minutes of drug side effect counseling today.  We discussed side effect possibility and risk versus benefits. [name] agreed to the medication and will contact this office if these side effects are intolerable.  Repetitive spaced learning was employed today to elicit superior memory formation and behavioral change.   4. Obesity with current BMI of 82.5  Alexandra Beck is currently in the action stage of change. As such, her goal is to continue with weight loss efforts. She has agreed to the Category 4 Plan and keeping a food journal and adhering to recommended goals of 750 calories and 45+ grams of protein for supper.   Exercise goals: No exercise has been prescribed at this time.  Behavioral modification strategies: increasing lean protein intake, meal planning and cooking strategies, keeping healthy foods in the home, and planning for success.  Alexandra Beck has agreed to follow-up with our clinic in 2-3 weeks. She was informed of the importance of frequent follow-up visits to maximize her success with intensive lifestyle modifications for her multiple health conditions.   Objective:   Blood pressure 122/64, pulse 71, temperature 98.3 F (  36.8 C), height 5\' 3"  (1.6 m), weight (!) 465 lb (210.9 kg), SpO2 95 %. Body mass index is 82.37 kg/m.  General: Cooperative, alert, well developed, in no acute distress. HEENT: Conjunctivae and lids unremarkable. Cardiovascular: Regular rhythm.  Lungs: Normal work of  breathing. Neurologic: No focal deficits.   Lab Results  Component Value Date   CREATININE 1.00 11/11/2020   BUN 22 11/11/2020   NA 141 11/11/2020   K 4.4 11/11/2020   CL 101 11/11/2020   CO2 25 11/11/2020   Lab Results  Component Value Date   ALT 13 11/11/2020   AST 14 11/11/2020   ALKPHOS 82 11/11/2020   BILITOT 0.3 11/11/2020   Lab Results  Component Value Date   HGBA1C 5.9 (H) 11/11/2020   HGBA1C 5.6 12/17/2019   HGBA1C 5.6 07/04/2019   HGBA1C 5.4 12/04/2018   HGBA1C 5.8 (H) 07/25/2018   Lab Results  Component Value Date   INSULIN 23.6 11/11/2020   INSULIN 17.1 12/17/2019   INSULIN 23.5 07/04/2019   INSULIN 25.8 (H) 12/04/2018   INSULIN 24.7 07/25/2018   Lab Results  Component Value Date   TSH 2.94 01/08/2021   Lab Results  Component Value Date   CHOL 185 11/11/2020   HDL 53 11/11/2020   LDLCALC 103 (H) 11/11/2020   LDLDIRECT 141.0 03/02/2016   TRIG 169 (H) 11/11/2020   CHOLHDL 4 03/23/2018   Lab Results  Component Value Date   VD25OH 30.2 11/11/2020   VD25OH 38.2 12/17/2019   VD25OH 34.6 07/04/2019   Lab Results  Component Value Date   WBC 5.9 07/25/2018   HGB 14.8 07/25/2018   HCT 44.8 07/25/2018   MCV 88 07/25/2018   PLT 230.0 03/23/2018   Attestation Statements:   Reviewed by clinician on day of visit: allergies, medications, problem list, medical history, surgical history, family history, social history, and previous encounter notes.  I, Water quality scientist, CMA, am acting as transcriptionist for Coralie Common, MD. I have reviewed the above documentation for accuracy and completeness, and I agree with the above. - Coralie Common, MD

## 2021-08-25 ENCOUNTER — Ambulatory Visit (INDEPENDENT_AMBULATORY_CARE_PROVIDER_SITE_OTHER): Payer: BC Managed Care – PPO | Admitting: Psychology

## 2021-08-25 DIAGNOSIS — F331 Major depressive disorder, recurrent, moderate: Secondary | ICD-10-CM

## 2021-08-28 ENCOUNTER — Other Ambulatory Visit: Payer: Self-pay | Admitting: Family

## 2021-08-28 ENCOUNTER — Encounter: Payer: Self-pay | Admitting: Family

## 2021-08-30 NOTE — Telephone Encounter (Signed)
I have called pt to gather more information. No answer and I left a message to call back.   Plan was to ask her to make an appointment to follow up since she is due for an appointment. Did she want to do that here at [facility] or see someone in [place]? That was the question to ask.

## 2021-09-01 ENCOUNTER — Ambulatory Visit (INDEPENDENT_AMBULATORY_CARE_PROVIDER_SITE_OTHER): Payer: BC Managed Care – PPO | Admitting: Psychology

## 2021-09-01 DIAGNOSIS — F331 Major depressive disorder, recurrent, moderate: Secondary | ICD-10-CM | POA: Diagnosis not present

## 2021-09-07 ENCOUNTER — Encounter (INDEPENDENT_AMBULATORY_CARE_PROVIDER_SITE_OTHER): Payer: Self-pay | Admitting: Family Medicine

## 2021-09-07 ENCOUNTER — Other Ambulatory Visit: Payer: Self-pay

## 2021-09-07 ENCOUNTER — Ambulatory Visit (INDEPENDENT_AMBULATORY_CARE_PROVIDER_SITE_OTHER): Payer: BC Managed Care – PPO | Admitting: Family Medicine

## 2021-09-07 VITALS — BP 137/75 | HR 83 | Temp 98.3°F | Ht 63.0 in | Wt >= 6400 oz

## 2021-09-07 DIAGNOSIS — Z9189 Other specified personal risk factors, not elsewhere classified: Secondary | ICD-10-CM

## 2021-09-07 DIAGNOSIS — I5032 Chronic diastolic (congestive) heart failure: Secondary | ICD-10-CM | POA: Diagnosis not present

## 2021-09-07 DIAGNOSIS — E1165 Type 2 diabetes mellitus with hyperglycemia: Secondary | ICD-10-CM

## 2021-09-07 DIAGNOSIS — Z6841 Body Mass Index (BMI) 40.0 and over, adult: Secondary | ICD-10-CM

## 2021-09-07 NOTE — Progress Notes (Signed)
Bnp

## 2021-09-07 NOTE — Progress Notes (Addendum)
Chief Complaint:   OBESITY Alexandra Beck is here to discuss her progress with her obesity treatment plan along with follow-up of her obesity related diagnoses. [name] is on the Category 4 Plan and keeping a food journal and adhering to recommended goals of 400-750 calories and 45 protein and states she is following her eating plan approximately 70% of the time. [name] states she is doing 0 minutes 0 times per week.  Today's visit was #: 22 Starting weight: 468 lbs Starting date: 04/15/2018 Today's weight: 466 lbs Today's date: 09/07/2021 Total lbs lost to date: 2 lbs Total lbs lost since last in-office visit: 0  Interim History: Alexandra Beck took a recent trip and was off plan. She is affected by her wife in her food decisions and they tend to be on plan or off plan together. They tend to keep unhealthy foods at home for snacks at times. She admits emotional eating is an issue for her.   Subjective:   1. Chronic diastolic CHF (congestive heart failure) (HCC) Ileene notes increased shortness of breath the last few weeks and she is very worried about it. Denies orthopnea. She has a CPAP and notes breathing at night is normal. She says has not seen cardiology in over a year and missed her appointment ischeduled in August 2022. Last echo was in July 2019. Her EF was 50-55%.  2. Type 2 diabetes mellitus with hyperglycemia, without long-term current use of insulin (HCC) Well controlled. [name] has not started [facility] yet. She and her wife are both starting [facility] and will take the injections today.   Lab Results  Component Value Date   HGBA1C 5.9 (H) 11/11/2020   HGBA1C 5.6 12/17/2019   HGBA1C 5.6 07/04/2019   Lab Results  Component Value Date   MICROALBUR 8.05 (H) 06/26/2007   LDLCALC 103 (H) 11/11/2020   CREATININE 1.00 11/11/2020   Lab Results  Component Value Date   INSULIN 23.6 11/11/2020   INSULIN 17.1 12/17/2019   INSULIN 23.5 07/04/2019   INSULIN 25.8 (H) 12/04/2018   INSULIN 24.7  07/25/2018    3. At risk for fluid volume overload Seleny is at risk for fluid volume overload due CHF.  Assessment/Plan:   1. Chronic diastolic CHF (congestive heart failure) (HCC) Alexandra Beck will make an appointment with cardiology as soon as possible for evaluation. We will check BNP today at her request.  - B Nat Peptide  2. Type 2 diabetes mellitus with hyperglycemia, without long-term current use of insulin (HCC) Alexandra Beck agrees to start [facility] today.   3. At risk for fluid volume overload Alexandra Beck was given approximately 15 minutes of fluid retention prevention counseling today. She is 40 y.o. female and has risk factors for fluid retention including obesity. We discussed intensive lifestyle modifications today with an emphasis on specific weight loss instructions, proper nutrition and exercise strategies.   Repetitive spaced learning was employed today to elicit superior memory formation and behavioral change.   4. Obesity with current BMI of 82.57 Alexandra Beck is currently in the action stage of change. As such, her goal is to continue with weight loss efforts. She has agreed to the Category 4 Plan.   Exercise goals: No exercise has been prescribed at this time.  Behavioral modification strategies: decreasing simple carbohydrates and keeping healthy foods in the home.  Alexandra Beck has agreed to follow-up with our clinic in 3 weeks with Dr. Jearld Shines.  Objective:   Blood pressure 137/75, pulse 83, temperature 98.3 F (36.8 C), height 5\' 3"  (  1.6 m), weight (!) 466 lb (211.4 kg), SpO2 91 %. Body mass index is 82.55 kg/m. Lungs = CTA  General: Cooperative, alert, well developed, in no acute distress. HEENT: Conjunctivae and lids unremarkable. Cardiovascular: Regular rhythm.  Lungs: CTA Neurologic: No focal deficits.   Lab Results  Component Value Date   CREATININE 1.00 11/11/2020   BUN 22 11/11/2020   NA 141 11/11/2020   K 4.4 11/11/2020   CL 101 11/11/2020   CO2 25 11/11/2020   Lab  Results  Component Value Date   ALT 13 11/11/2020   AST 14 11/11/2020   ALKPHOS 82 11/11/2020   BILITOT 0.3 11/11/2020   Lab Results  Component Value Date   HGBA1C 5.9 (H) 11/11/2020   HGBA1C 5.6 12/17/2019   HGBA1C 5.6 07/04/2019   HGBA1C 5.4 12/04/2018   HGBA1C 5.8 (H) 07/25/2018   Lab Results  Component Value Date   INSULIN 23.6 11/11/2020   INSULIN 17.1 12/17/2019   INSULIN 23.5 07/04/2019   INSULIN 25.8 (H) 12/04/2018   INSULIN 24.7 07/25/2018   Lab Results  Component Value Date   TSH 2.94 01/08/2021   Lab Results  Component Value Date   CHOL 185 11/11/2020   HDL 53 11/11/2020   LDLCALC 103 (H) 11/11/2020   LDLDIRECT 141.0 03/02/2016   TRIG 169 (H) 11/11/2020   CHOLHDL 4 03/23/2018   Lab Results  Component Value Date   VD25OH 30.2 11/11/2020   VD25OH 38.2 12/17/2019   VD25OH 34.6 07/04/2019   Lab Results  Component Value Date   WBC 5.9 07/25/2018   HGB 14.8 07/25/2018   HCT 44.8 07/25/2018   MCV 88 07/25/2018   PLT 230.0 03/23/2018   No results found for: IRON, TIBC, FERRITIN  Attestation Statements:   Reviewed by clinician on day of visit: allergies, medications, problem list, medical history, surgical history, family history, social history, and previous encounter notes.  I, Lizbeth Bark, RMA, am acting as Location manager for [employer], [place].   I have reviewed the above documentation for accuracy and completeness, and I agree with the above. -  Georgianne Fick, FNP

## 2021-09-08 ENCOUNTER — Other Ambulatory Visit: Payer: Self-pay | Admitting: *Deleted

## 2021-09-08 ENCOUNTER — Ambulatory Visit (INDEPENDENT_AMBULATORY_CARE_PROVIDER_SITE_OTHER): Payer: BC Managed Care – PPO | Admitting: Psychology

## 2021-09-08 ENCOUNTER — Ambulatory Visit: Payer: BC Managed Care – PPO | Admitting: Interventional Cardiology

## 2021-09-08 ENCOUNTER — Encounter: Payer: Self-pay | Admitting: Interventional Cardiology

## 2021-09-08 VITALS — BP 134/72 | HR 81 | Ht 63.0 in | Wt >= 6400 oz

## 2021-09-08 DIAGNOSIS — I5032 Chronic diastolic (congestive) heart failure: Secondary | ICD-10-CM

## 2021-09-08 DIAGNOSIS — R0602 Shortness of breath: Secondary | ICD-10-CM

## 2021-09-08 DIAGNOSIS — F331 Major depressive disorder, recurrent, moderate: Secondary | ICD-10-CM | POA: Diagnosis not present

## 2021-09-08 DIAGNOSIS — I10 Essential (primary) hypertension: Secondary | ICD-10-CM | POA: Diagnosis not present

## 2021-09-08 DIAGNOSIS — Z6841 Body Mass Index (BMI) 40.0 and over, adult: Secondary | ICD-10-CM

## 2021-09-08 LAB — BRAIN NATRIURETIC PEPTIDE: BNP: 80.9 pg/mL (ref 0.0–100.0)

## 2021-09-08 NOTE — Progress Notes (Signed)
Cardiology Office Note   Date:  09/08/2021   ID:  Alexandra Beck, DOB [birth year], MRN [id number]  PCP:  Marrian Salvage, FNP    No chief complaint on file.  Shortness of breath  Wt Readings from Last 3 Encounters:  09/08/21 (!) 471 lb 9.6 oz (213.9 kg)  09/07/21 (!) 466 lb (211.4 kg)  08/23/21 (!) 465 lb (210.9 kg)       History of Present Illness: Alexandra Beck is a 40 y.o. female  with h/o chronic diastolic heart failure, morbid obesity.  Had significant weight loss down to 380 lbs a few years ago in early 2020.   2019 echo showed: "Left ventricle: The cavity size was normal. Wall thickness was at    the upper limits of normal. Systolic function was normal. The    estimated ejection fraction was in the range of 50% to 55%.    Although no diagnostic regional wall motion abnormality was    identified, this possibility cannot be completely excluded on the    basis of this study given the poor acoustic windows.  - Aortic valve: Transvalvular velocity was within the normal range.    There was no stenosis. There was no regurgitation.  - Mitral valve: There was no regurgitation.  - Right ventricle: Systolic function was normal.  - Tricuspid valve: There was no significant regurgitation.  - Pulmonic valve: There was no significant regurgitation.  - Poor acoustic windows. Echo contrast used to improve    visualization. "  Past Medical History:  Diagnosis Date   Acute renal failure (ARF) ([place]) 11/2012    multifactorial-likely secondary to ATN in the setting of sepsis and hypotension, and also from rhabdomyolysis   Anemia    Cellulitis    Chronic diastolic CHF (congestive heart failure) (HCC)    Depression    Diabetes mellitus (HCC)    GERD (gastroesophageal reflux disease)    Hyperlipidemia    Hypertension    Hypothyroidism    Morbid obesity with BMI of 70 and over, adult ([place])    Obesity hypoventilation syndrome (HCC)    OSA (obstructive sleep apnea)     PVC's (premature ventricular contractions)    Recurrent cellulitis of lower leg    LLE, venous insuff   Sepsis ([place]) 11/2012   Secondary to cellulitis   Sleep apnea    Venous insufficiency of leg     Past Surgical History:  Procedure Laterality Date   IR FLUORO GUIDE CV MIDLINE PICC RIGHT  03/28/2017   IR US GUIDE VASC ACCESS RIGHT  03/28/2017   WISDOM TOOTH EXTRACTION       Current Outpatient Medications  Medication Sig Dispense Refill   ARIPiprazole (ABILIFY) 5 MG tablet Take 1 tablet (5 mg total) by mouth daily. 30 tablet 5   buPROPion (WELLBUTRIN XL) 150 MG 24 hr tablet Take three tablets every morning. 90 tablet 5   levothyroxine (SYNTHROID) 150 MCG tablet TAKE 1 TABLET(150 MCG) BY MOUTH DAILY BEFORE AND BREAKFAST 90 tablet 2   losartan (COZAAR) 100 MG tablet Take 1 tablet (100 mg total) by mouth daily. Please schedule appt for future refills. Thank you 90 tablet 0   metFORMIN (GLUCOPHAGE) 500 MG tablet TAKE 1 TABLET(500 MG) BY MOUTH DAILY 90 tablet 3   metoprolol succinate (TOPROL-XL) 50 MG 24 hr tablet TAKE 1 TABLET BY MOUTH DAILY WITH OR IMMEDIATELY FOLLOWING A MEAL 90 tablet 3   modafinil (PROVIGIL) 100 MG tablet TAKE 1 TABLET BY MOUTH  around 2 pm 30 tablet 5   modafinil (PROVIGIL) 200 MG tablet TAKE 1 TABLET(200 MG) BY MOUTH DAILY AT 9 AM 60 tablet 1   rosuvastatin (CRESTOR) 10 MG tablet TAKE 1 TABLET(10 MG) BY MOUTH DAILY 90 tablet 3   sertraline (ZOLOFT) 100 MG tablet Take 1 tablet (100 mg total) by mouth 2 (two) times daily. 60 tablet 5   spironolactone (ALDACTONE) 25 MG tablet Take 1 tablet (25 mg total) by mouth daily. 90 tablet 0   tirzepatide (MOUNJARO) 2.5 MG/0.5ML Pen Inject 2.5 mg into the skin once a week. 2 mL 0   torsemide (DEMADEX) 20 MG tablet Take 2 tablets (40 mg total) by mouth daily. 180 tablet 0   traMADol (ULTRAM) 50 MG tablet Take 1-2 tablets (50-100 mg total) by mouth every 8 (eight) hours as needed. 20 tablet 0   No current facility-administered  medications for this visit.    Allergies:   Aspirin, Doxycycline, Lisinopril, Niacin and related, and Niaspan [niacin er]    Social History:  The patient  reports that she quit smoking about 11 years ago. Her smoking use included cigarettes. She has a 4.00 pack-year smoking history. She has never used smokeless tobacco. She reports that she does not drink alcohol and does not use drugs.   Family History:  The patient's family history includes Diabetes in her mother; Heart disease in her father; High Cholesterol in her father and mother; Hypertension in her father and mother; Obesity in her father and mother; Sleep apnea in her father and mother; Thyroid disease in her mother.    ROS:  Please see the history of present illness.   Otherwise, review of systems are positive for DOE.   All other systems are reviewed and negative.    PHYSICAL EXAM: VS:  BP 134/72   Pulse 81   Ht _0  (1.6 m)   Wt (!) 471 lb 9.6 oz (213.9 kg)   SpO2 93%   BMI 83.54 kg/m  , BMI Body mass index is 83.54 kg/m. GEN: Well nourished, well developed, in no acute distress HEENT: normal Neck: no JVD, carotid bruits, or masses Cardiac: RRR; no murmurs, rubs, or gallops,; bilateral lower extremity edema , left greater than right (chronic per her report).  Right is more pitting, left is nonpitting Respiratory:  clear to auscultation bilaterally, normal work of breathing GI: soft, nontender, nondistended, + BS MS: no deformity or atrophy Skin: warm and dry, no rash Neuro:  Strength and sensation are intact Psych: euthymic mood, full affect   EKG:   The ekg ordered today demonstrates NSR, no ST changes   Recent Labs: 11/11/2020: ALT 13; BUN 22; Creatinine, Ser 1.00; Potassium 4.4; Sodium 141 01/08/2021: TSH 2.94 09/07/2021: BNP 80.9   Lipid Panel    Component Value Date/Time   CHOL 185 11/11/2020 1211   TRIG 169 (H) 11/11/2020 1211   HDL 53 11/11/2020 1211   CHOLHDL 4 03/23/2018 1224   VLDL 29.6  03/23/2018 1224   LDLCALC 103 (H) 11/11/2020 1211   LDLDIRECT 141.0 03/02/2016 1101     Other studies Reviewed: Additional studies/ records that were reviewed today with results demonstrating: BNP 80 on 09/07/21.   ASSESSMENT AND PLAN:  Chronic diastolic heart failure: This has been a longstanding issue for her managed with diuretics.  She had been on furosemide in the past now on torsemide and Aldactone.  In the past, the symptoms she currently reports is correlated to volume overload.  We will give  her a 3-day trial of torsemide 40 mg twice daily.  Check labs early next week including CBC and c-Met.  She has not noticed any acute bleeding issues. Morbid obesity: She is in a weight loss clinic.  Blood work is checked there on a regular basis. Hypertension: Continue current medications.  Blood pressure well controlled today. If her shortness of breath persists despite additional diuresis, other etiologies will have to be investigated.  We will also check chest x-ray and echocardiogram to further evaluate.  She does see pulmonary on a regular basis.  We will schedule follow-up with them if cardiac work-up is negative.   Current medicines are reviewed at length with the patient today.  The patient concerns regarding her medicines were addressed.  The following changes have been made:  increase diuretic  Labs/ tests ordered today include: CBC, cMet next week. No orders of the defined types were placed in this encounter.   Recommend 150 minutes/week of aerobic exercise Low fat, low carb, high fiber diet recommended  Disposition:   FU in 6 months   Signed, Larae Grooms, MD  09/08/2021 4:47 PM    [facility] [address], [place], [place]  [address] Phone: [phone]; Fax: [phone]

## 2021-09-08 NOTE — Patient Instructions (Signed)
Medication Instructions:  Your physician has recommended you make the following change in your medication: Increase torsemide to 40 mg twice daily for 3 days.  Do not take second dose after 3 PM  *If you need a refill on your cardiac medications before your next appointment, please call your pharmacy*   Lab Work: Have lab work done on Monday 10/17 at [facility].  [place] and [facility]   If you have labs (blood work) drawn today and your tests are completely normal, you will receive your results only by: [place] (if you have [place]) OR A paper copy in the mail If you have any lab test that is abnormal or we need to change your treatment, we will call you to review the results.   Testing/Procedures: Your physician has requested that you have an echocardiogram. Echocardiography is a painless test that uses sound waves to create images of your heart. It provides your doctor with information about the size and shape of your heart and how well your heart's chambers and valves are working. This procedure takes approximately one hour. There are no restrictions for this procedure.  Have Chest X ray done at [facility].   Follow-Up: At [facility], you and your health needs are our priority.  As part of our continuing mission to provide you with exceptional heart care, we have created designated Provider Care Teams.  These Care Teams include your primary Cardiologist (physician) and Advanced Practice Providers (APPs -  Physician Assistants and Nurse Practitioners) who all work together to provide you with the care you need, when you need it.  We recommend signing up for the patient portal called "MyChart".  Sign up information is provided on this After Visit Summary.  MyChart is used to connect with patients for Virtual Visits (Telemedicine).  Patients are able to view lab/test results, encounter notes, upcoming appointments, etc.  Non-urgent messages can be sent to your provider  as well.   To learn more about what you can do with MyChart, go to [email or web address].    Your next appointment:   6 month(s)  The format for your next appointment:   In Person  Provider:   You may see Larae Grooms, MD or one of the following Advanced Practice Providers on your designated Care Team:   [name] Copa, PA-C Ermalinda Barrios, PA-C   Other Instructions

## 2021-09-09 ENCOUNTER — Ambulatory Visit
Admission: RE | Admit: 2021-09-09 | Discharge: 2021-09-09 | Disposition: A | Discharge status: Home / Self Care | Payer: BC Managed Care – PPO | Source: Ambulatory Visit | Attending: Interventional Cardiology | Admitting: Interventional Cardiology

## 2021-09-09 DIAGNOSIS — R0602 Shortness of breath: Secondary | ICD-10-CM

## 2021-09-09 DIAGNOSIS — I5032 Chronic diastolic (congestive) heart failure: Secondary | ICD-10-CM

## 2021-09-10 ENCOUNTER — Other Ambulatory Visit: Payer: Self-pay

## 2021-09-10 ENCOUNTER — Encounter: Payer: Self-pay | Admitting: Adult Health

## 2021-09-10 ENCOUNTER — Ambulatory Visit (INDEPENDENT_AMBULATORY_CARE_PROVIDER_SITE_OTHER): Payer: BC Managed Care – PPO | Admitting: Adult Health

## 2021-09-10 ENCOUNTER — Ambulatory Visit: Payer: BC Managed Care – PPO | Admitting: Cardiovascular Disease

## 2021-09-10 DIAGNOSIS — F428 Other obsessive-compulsive disorder: Secondary | ICD-10-CM | POA: Diagnosis not present

## 2021-09-10 DIAGNOSIS — F331 Major depressive disorder, recurrent, moderate: Secondary | ICD-10-CM

## 2021-09-10 DIAGNOSIS — G4700 Insomnia, unspecified: Secondary | ICD-10-CM | POA: Diagnosis not present

## 2021-09-10 NOTE — Progress Notes (Signed)
Alexandra Beck [id number] 04-30-1981 40 y.o.  Subjective:   Patient ID:  Alexandra Beck is a 40 y.o. (DOB Nov 16, 1981) female.  Chief Complaint: No chief complaint on file.   HPI  Alexandra Beck presents to the office today for follow-up of insomnia, obsessional thoughts, and depression.  Describes mood today as "ok". Pleasant. Decreased tearfulness. Mood symptoms - reports decreased depression, anxiety, and motivation. Stating "I'm doing alright". Feels more well adjusted. Not taking things to heart. Less ruminations. Able to think about things more objectively. Feels like her "mental" functions are better. Continues to struggle with the physical functions. Work going well. She and wife doing well. Recent trip to [place]. Seeing therapist - Bambi Cottle for therapy - has started EMDR. Varying interest and motivation. Taking medications as prescribed.  Energy levels low. Active, does not have a regular exercise routine.  Enjoys some usual interests and activities. Married. Lives with wife and 2 cats. Spending time with family. Gaming online with friends. Appetite adequate. Weight gain. Working with a weight management clinic.  Sleeping well most nights. Averages 7 to 8 hours.  Focus and concentration stable. Completing tasks. Managing minimal aspects of household. Works 12 to 9 - at [employer].  Denies SI or HI.  Denies AH or VH. Seeing Bambi Cottle for therapy.  Previous medication trials: Trialed on Wellbutrin at age 58, Trintellix-weird dreams.   [place] Office Visit from 04/17/2018 in [place] Office Visit from 02/27/2018 in [facility] Infectious Disease Office Visit from 10/11/2017 in [facility] for Infectious [facility]  PHQ-2 Total Score 6 0 0  PHQ-9 Total Score 23 -- --        Review of Systems:  Review of Systems  Musculoskeletal:  Negative for gait problem.  Neurological:  Negative for tremors.   Psychiatric/Behavioral:         Please refer to HPI   Medications: I have reviewed the patient's current medications.  Current Outpatient Medications  Medication Sig Dispense Refill   ARIPiprazole (ABILIFY) 5 MG tablet Take 1 tablet (5 mg total) by mouth daily. 30 tablet 5   buPROPion (WELLBUTRIN XL) 150 MG 24 hr tablet Take three tablets every morning. 90 tablet 5   levothyroxine (SYNTHROID) 150 MCG tablet TAKE 1 TABLET(150 MCG) BY MOUTH DAILY BEFORE AND BREAKFAST 90 tablet 2   losartan (COZAAR) 100 MG tablet Take 1 tablet (100 mg total) by mouth daily. Please schedule appt for future refills. Thank you 90 tablet 0   metFORMIN (GLUCOPHAGE) 500 MG tablet TAKE 1 TABLET(500 MG) BY MOUTH DAILY 90 tablet 3   metoprolol succinate (TOPROL-XL) 50 MG 24 hr tablet TAKE 1 TABLET BY MOUTH DAILY WITH OR IMMEDIATELY FOLLOWING A MEAL 90 tablet 3   modafinil (PROVIGIL) 100 MG tablet TAKE 1 TABLET BY MOUTH around 2 pm 30 tablet 5   modafinil (PROVIGIL) 200 MG tablet TAKE 1 TABLET(200 MG) BY MOUTH DAILY AT 9 AM 60 tablet 1   rosuvastatin (CRESTOR) 10 MG tablet TAKE 1 TABLET(10 MG) BY MOUTH DAILY 90 tablet 3   sertraline (ZOLOFT) 100 MG tablet Take 1 tablet (100 mg total) by mouth 2 (two) times daily. 60 tablet 5   spironolactone (ALDACTONE) 25 MG tablet Take 1 tablet (25 mg total) by mouth daily. 90 tablet 0   tirzepatide (MOUNJARO) 2.5 MG/0.5ML Pen Inject 2.5 mg into the skin once a week. 2 mL 0   torsemide (DEMADEX)  20 MG tablet Take 2 tablets (40 mg total) by mouth daily. 180 tablet 0   traMADol (ULTRAM) 50 MG tablet Take 1-2 tablets (50-100 mg total) by mouth every 8 (eight) hours as needed. 20 tablet 0   No current facility-administered medications for this visit.    Medication Side Effects: None  Allergies:  Allergies  Allergen Reactions   Aspirin Hives and Swelling    REACTION: throat swelling, hives Other reaction(s): Unknown   Doxycycline Other (See Comments)    Abdominal pain Other  reaction(s): Unknown   Lisinopril Cough   Niacin And Related     Other reaction(s): Unknown   Niaspan [Niacin Er]     Caused flushing    Past Medical History:  Diagnosis Date   Acute renal failure (ARF) ([place]) 11/2012    multifactorial-likely secondary to ATN in the setting of sepsis and hypotension, and also from rhabdomyolysis   Anemia    Cellulitis    Chronic diastolic CHF (congestive heart failure) (HCC)    Depression    Diabetes mellitus (HCC)    GERD (gastroesophageal reflux disease)    Hyperlipidemia    Hypertension    Hypothyroidism    Morbid obesity with BMI of 70 and over, adult ([place])    Obesity hypoventilation syndrome (HCC)    OSA (obstructive sleep apnea)    PVC's (premature ventricular contractions)    Recurrent cellulitis of lower leg    LLE, venous insuff   Sepsis ([place]) 11/2012   Secondary to cellulitis   Sleep apnea    Venous insufficiency of leg     Past Medical History, Surgical history, Social history, and Family history were reviewed and updated as appropriate.   Please see review of systems for further details on the patient's review from today.   Objective:   Physical Exam:  There were no vitals taken for this visit.  Physical Exam Constitutional:      General: She is not in acute distress. Musculoskeletal:        General: No deformity.  Neurological:     Mental Status: She is alert and oriented to person, place, and time.     Coordination: Coordination normal.  Psychiatric:        Attention and Perception: Attention and perception normal. She does not perceive auditory or visual hallucinations.        Mood and Affect: Mood normal. Mood is not anxious or depressed. Affect is not labile, blunt, angry or inappropriate.        Speech: Speech normal.        Behavior: Behavior normal.        Thought Content: Thought content normal. Thought content is not paranoid or delusional. Thought content does not include homicidal or suicidal ideation.  Thought content does not include homicidal or suicidal plan.        Cognition and Memory: Cognition and memory normal.        Judgment: Judgment normal.     Comments: Insight intact    Lab Review:     Component Value Date/Time   NA 141 11/11/2020 1211   K 4.4 11/11/2020 1211   CL 101 11/11/2020 1211   CO2 25 11/11/2020 1211   GLUCOSE 107 (H) 11/11/2020 1211   GLUCOSE 117 (H) 03/23/2018 1224   BUN 22 11/11/2020 1211   CREATININE 1.00 11/11/2020 1211   CREATININE 1.06 11/14/2016 1050   CALCIUM 9.9 11/11/2020 1211   CALCIUM 9.6 06/26/2007 0000   PROT 6.9 11/11/2020 1211  ALBUMIN 4.1 11/11/2020 1211   AST 14 11/11/2020 1211   ALT 13 11/11/2020 1211   ALKPHOS 82 11/11/2020 1211   BILITOT 0.3 11/11/2020 1211   GFRNONAA 71 11/11/2020 1211   GFRAA 81 11/11/2020 1211       Component Value Date/Time   WBC 5.9 07/25/2018 1234   WBC 7.9 03/23/2018 1224   RBC 5.07 07/25/2018 1234   RBC 5.65 (H) 03/23/2018 1224   HGB 14.8 07/25/2018 1234   HCT 44.8 07/25/2018 1234   PLT 230.0 03/23/2018 1224   MCV 88 07/25/2018 1234   MCH 29.2 07/25/2018 1234   MCH 26.8 04/19/2017 0419   MCHC 33.0 07/25/2018 1234   MCHC 31.6 03/23/2018 1224   RDW 15.6 (H) 07/25/2018 1234   LYMPHSABS 1.4 07/25/2018 1234   MONOABS 0.7 04/16/2017 2351   EOSABS 0.2 07/25/2018 1234   BASOSABS 0.0 07/25/2018 1234    No results found for: POCLITH, LITHIUM   No results found for: PHENYTOIN, PHENOBARB, VALPROATE, CBMZ   .res Assessment: Plan:    Plan:   PDMP reviewed  1. Wellbutrin XL 450mg . Denies seizure history. 2. Zoloft 100mg  daily - take two tablets daily 3. Abilify 5mg  - one tablet daily.  RTC 3 months  Patient advised to contact office with any questions, adverse effects, or acute worsening in signs and symptoms.  Discussed potential metabolic side effects associated with atypical antipsychotics, as well as potential risk for movement side effects. Advised pt to contact office if movement side  effects occur.     There are no diagnoses linked to this encounter.   Please see After Visit Summary for patient specific instructions.  Future Appointments  Date Time Provider [place]  09/15/2021  2:40 PM Biagio Borg, MD [facility] None  09/21/2021 12:40 PM Laqueta Linden, MD [facility] None  09/22/2021  1:00 PM Cottle, Lucious Groves, LCSW [facility] None  09/27/2021 12:45 PM [facility] 3 [facility]  09/29/2021  1:00 PM Cottle, Bambi G, LCSW [facility] None  10/06/2021  1:00 PM Cottle, Bambi G, LCSW [facility] None    No orders of the defined types were placed in this encounter.   -------------------------------

## 2021-09-14 LAB — CBC
Hematocrit: 34.3 % (ref 34.0–46.6)
Hemoglobin: 10.7 g/dL — ABNORMAL LOW (ref 11.1–15.9)
MCH: 27 pg (ref 26.6–33.0)
MCHC: 31.2 g/dL — ABNORMAL LOW (ref 31.5–35.7)
MCV: 87 fL (ref 79–97)
Platelets: 229 10*3/uL (ref 150–450)
RBC: 3.96 x10E6/uL (ref 3.77–5.28)
RDW: 15 % (ref 11.7–15.4)
WBC: 6.3 10*3/uL (ref 3.4–10.8)

## 2021-09-15 ENCOUNTER — Ambulatory Visit: Payer: BC Managed Care – PPO | Admitting: Internal Medicine

## 2021-09-15 ENCOUNTER — Ambulatory Visit: Payer: BC Managed Care – PPO | Admitting: Primary Care

## 2021-09-15 ENCOUNTER — Other Ambulatory Visit: Payer: Self-pay

## 2021-09-15 ENCOUNTER — Encounter: Payer: Self-pay | Admitting: Internal Medicine

## 2021-09-15 ENCOUNTER — Other Ambulatory Visit: Payer: Self-pay | Admitting: Pulmonary Disease

## 2021-09-15 ENCOUNTER — Other Ambulatory Visit: Payer: Self-pay | Admitting: Primary Care

## 2021-09-15 ENCOUNTER — Ambulatory Visit: Payer: BC Managed Care – PPO | Admitting: Psychology

## 2021-09-15 VITALS — BP 150/82 | HR 68 | Temp 98.3°F | Ht 62.0 in | Wt >= 6400 oz

## 2021-09-15 VITALS — BP 142/70 | HR 67 | Temp 97.9°F | Ht 63.0 in | Wt >= 6400 oz

## 2021-09-15 DIAGNOSIS — I5032 Chronic diastolic (congestive) heart failure: Secondary | ICD-10-CM

## 2021-09-15 DIAGNOSIS — E559 Vitamin D deficiency, unspecified: Secondary | ICD-10-CM

## 2021-09-15 DIAGNOSIS — L301 Dyshidrosis [pompholyx]: Secondary | ICD-10-CM

## 2021-09-15 DIAGNOSIS — R0600 Dyspnea, unspecified: Secondary | ICD-10-CM

## 2021-09-15 DIAGNOSIS — R0602 Shortness of breath: Secondary | ICD-10-CM

## 2021-09-15 DIAGNOSIS — G4733 Obstructive sleep apnea (adult) (pediatric): Secondary | ICD-10-CM | POA: Diagnosis not present

## 2021-09-15 DIAGNOSIS — G8929 Other chronic pain: Secondary | ICD-10-CM | POA: Diagnosis not present

## 2021-09-15 LAB — COMPREHENSIVE METABOLIC PANEL
ALT: 17 IU/L (ref 0–32)
AST: 16 IU/L (ref 0–40)
Albumin/Globulin Ratio: 1.3 (ref 1.2–2.2)
Albumin: 4.1 g/dL (ref 3.8–4.8)
Alkaline Phosphatase: 89 IU/L (ref 44–121)
BUN/Creatinine Ratio: 23 (ref 9–23)
BUN: 26 mg/dL — ABNORMAL HIGH (ref 6–24)
Bilirubin Total: 0.2 mg/dL (ref 0.0–1.2)
CO2: 23 mmol/L (ref 20–29)
Calcium: 9.3 mg/dL (ref 8.7–10.2)
Chloride: 100 mmol/L (ref 96–106)
Creatinine, Ser: 1.11 mg/dL — ABNORMAL HIGH (ref 0.57–1.00)
Globulin, Total: 3.1 g/dL (ref 1.5–4.5)
Glucose: 150 mg/dL — ABNORMAL HIGH (ref 70–99)
Potassium: 4.4 mmol/L (ref 3.5–5.2)
Sodium: 141 mmol/L (ref 134–144)
Total Protein: 7.2 g/dL (ref 6.0–8.5)
eGFR: 64 mL/min/{1.73_m2} (ref >59)

## 2021-09-15 MED ORDER — FLUTICASONE-SALMETEROL 250-50 MCG/ACT IN AEPB: 1.0000 | INHALATION_SPRAY | Freq: Two times a day (BID) | RESPIRATORY_TRACT | 1 refills | Status: DC

## 2021-09-15 MED ORDER — ALBUTEROL SULFATE HFA 108 (90 BASE) MCG/ACT IN AERS: 2.0000 | INHALATION_SPRAY | Freq: Four times a day (QID) | RESPIRATORY_TRACT | 1 refills | Status: DC | PRN

## 2021-09-15 MED ORDER — TRAMADOL HCL 50 MG PO TABS: 50.0000 mg | ORAL_TABLET | Freq: Three times a day (TID) | ORAL | 0 refills | Status: DC | PRN

## 2021-09-15 MED ORDER — CYCLOBENZAPRINE HCL 5 MG PO TABS: 5.0000 mg | ORAL_TABLET | Freq: Three times a day (TID) | ORAL | 1 refills | Status: DC | PRN

## 2021-09-15 MED ORDER — TRIAMCINOLONE ACETONIDE 0.1 % EX CREA: 1.0000 "application " | TOPICAL_CREAM | Freq: Two times a day (BID) | CUTANEOUS | 0 refills | Status: DC

## 2021-09-15 NOTE — Patient Instructions (Addendum)
You had the flu shot today, or have the flu shot done at pulmonary  You are given the signed handicapped application  Please take all new medication as prescribed - the steroid cream  Please continue all other medications as before, and refills have been done if requested - the tramadol  Please take OTC Vitamin D3 at 2000 units per day, indefinitely  Please have the pharmacy call with any other refills you may need.  Please continue your efforts at being more active, low cholesterol diet, and weight control  Please keep your appointments with your specialists as you may have planned  Plesae keep your appt with pulmonary today  Please go to the LAB at the blood drawing area for the tests to be done - at the [facility] lab at your convenience  You will be contacted by phone if any changes need to be made immediately.  Otherwise, you will receive a letter about your results with an explanation, but please check with MyChart first.  Please remember to sign up for MyChart if you have not done so, as this will be important to you in the future with finding out test results, communicating by private email, and scheduling acute appointments online when needed.  Please make an Appointment to return in 3 months, or sooner if needed

## 2021-09-15 NOTE — Assessment & Plan Note (Addendum)
-   BNP 79 on 10/11 - Maintained on Torsemide 40mg  daily and Aldactone 25mg  daily

## 2021-09-15 NOTE — Assessment & Plan Note (Addendum)
-   Patient reports new and worsen dyspnea symptoms for the last 2 week. She has hx HF, BNP was recently normal. CXR on 10/14 showed fullness in the central vasculature. She was instructed by cardiology to increase Torsemide x 3 days but reports no clinical benefit. She is mildly anemic and is currently under going work up with PCP. She is also scheduled for an echocardiogram on 09/27/21. Recommend checking  D-dimer to rule out PE and needs pulmonary function testing to assess for obstructive lung disease. Given therapeutic trial Advair 250-73mcg 1 puff every 12 hours. She will have a virtual visit with me in 2 week to assess response and is scheduled to see Dr. Elsworth Soho in November with PFTs prior.

## 2021-09-15 NOTE — Progress Notes (Signed)
Patient ID: KARAGAN LEHR, female   DOB: December 07, 1980, 40 y.o.   MRN: [id number]        Chief Complaint: follow up dyspnea, anemia, low vit d, eczema, chronic lbp/knee pain       HPI:  GLADY OUDERKIRK is a 40 y.o. female here with c/o dyspnea unexplained, has pulm eval later today.  Not taking Vit d.  No recent overt bleeding.  Has chronic lower back pain, knee pain pain uncontrolled.  Has worsening bialteral hand eczema rash worsening in last few weeks.  Due for flu shot.  Needs handicapped parking application.  Pt denies chest pain, orthopnea, PND, increased LE swelling, palpitations, dizziness or syncope   Pt denies polydipsia, polyuria, or new focal neuro s/s.   Pt denies fever, wt loss, night sweats, loss of appetite, or other constitutional symptoms        Wt Readings from Last 3 Encounters:  09/18/21 (!) 477 lb 9.6 oz (216.6 kg)  09/15/21 (!) 467 lb (211.8 kg)  09/15/21 (!) 466 lb (211.4 kg)   BP Readings from Last 3 Encounters:  09/18/21 128/70  09/15/21 (!) 150/82  09/15/21 (!) 142/70         Past Medical History:  Diagnosis Date   Acute renal failure (ARF) ([place]) 11/2012    multifactorial-likely secondary to ATN in the setting of sepsis and hypotension, and also from rhabdomyolysis   Anemia    Cellulitis    Chronic diastolic CHF (congestive heart failure) ([place])    Depression    Diabetes mellitus (HCC)    GERD (gastroesophageal reflux disease)    Hyperlipidemia    Hypertension    Hypothyroidism    Morbid obesity with BMI of 70 and over, adult ([place])    Obesity hypoventilation syndrome (HCC)    OSA (obstructive sleep apnea)    PVC's (premature ventricular contractions)    Recurrent cellulitis of lower leg    LLE, venous insuff   Sepsis ([place]) 11/2012   Secondary to cellulitis   Sleep apnea    Venous insufficiency of leg    Past Surgical History:  Procedure Laterality Date   IR FLUORO GUIDE CV MIDLINE PICC RIGHT  03/28/2017   IR US GUIDE VASC ACCESS RIGHT  03/28/2017   WISDOM  TOOTH EXTRACTION      reports that she quit smoking about 11 years ago. Her smoking use included cigarettes. She has a 4.00 pack-year smoking history. She has never used smokeless tobacco. She reports that she does not drink alcohol and does not use drugs. family history includes Diabetes in her mother; Heart disease in her father; High Cholesterol in her father and mother; Hypertension in her father and mother; Obesity in her father and mother; Sleep apnea in her father and mother; Thyroid disease in her mother. Allergies  Allergen Reactions   Aspirin Hives and Swelling    REACTION: throat swelling, hives Other reaction(s): Unknown   Doxycycline Other (See Comments)    Abdominal pain Other reaction(s): Unknown   Lisinopril Cough   Niacin And Related     Other reaction(s): Unknown   Niaspan [Niacin Er]     Caused flushing   Current Outpatient Medications on File Prior to Visit  Medication Sig Dispense Refill   ARIPiprazole (ABILIFY) 5 MG tablet Take 1 tablet (5 mg total) by mouth daily. 30 tablet 5   buPROPion (WELLBUTRIN XL) 150 MG 24 hr tablet Take three tablets every morning. (Patient taking differently: Take 450 mg by mouth daily. Take  three tablets every morning.) 90 tablet 5   levothyroxine (SYNTHROID) 150 MCG tablet TAKE 1 TABLET(150 MCG) BY MOUTH DAILY BEFORE AND BREAKFAST (Patient taking differently: Take 150 mcg by mouth daily before breakfast.) 90 tablet 2   losartan (COZAAR) 100 MG tablet Take 1 tablet (100 mg total) by mouth daily. Please schedule appt for future refills. Thank you 90 tablet 0   metFORMIN (GLUCOPHAGE) 500 MG tablet TAKE 1 TABLET(500 MG) BY MOUTH DAILY (Patient taking differently: Take 500 mg by mouth daily with breakfast.) 90 tablet 3   metoprolol succinate (TOPROL-XL) 50 MG 24 hr tablet TAKE 1 TABLET BY MOUTH DAILY WITH OR IMMEDIATELY FOLLOWING A MEAL (Patient taking differently: Take 50 mg by mouth daily.) 90 tablet 3   rosuvastatin (CRESTOR) 10 MG tablet  TAKE 1 TABLET(10 MG) BY MOUTH DAILY (Patient taking differently: Take 10 mg by mouth daily.) 90 tablet 3   sertraline (ZOLOFT) 100 MG tablet Take 1 tablet (100 mg total) by mouth 2 (two) times daily. (Patient taking differently: Take 200 mg by mouth daily.) 60 tablet 5   spironolactone (ALDACTONE) 25 MG tablet Take 1 tablet (25 mg total) by mouth daily. 90 tablet 0   tirzepatide (MOUNJARO) 2.5 MG/0.5ML Pen Inject 2.5 mg into the skin once a week. (Patient taking differently: Inject 2.5 mg into the skin every Wednesday.) 2 mL 0   torsemide (DEMADEX) 20 MG tablet Take 2 tablets (40 mg total) by mouth daily. 180 tablet 0   No current facility-administered medications on file prior to visit.        ROS:  All others reviewed and negative.  Objective        PE:  BP (!) 142/70 (BP Location: Left Arm, Patient Position: Sitting, Cuff Size: Large)   Pulse 67   Temp 97.9 F (36.6 C) (Oral)   Ht 5\' 3"  (1.6 m)   Wt (!) 466 lb (211.4 kg)   LMP 09/09/2021   SpO2 96%   BMI 82.55 kg/m                 Constitutional: Pt appears in NAD               HENT: Head: NCAT.                Right Ear: External ear normal.                 Left Ear: External ear normal.                Eyes: . Pupils are equal, round, and reactive to light. Conjunctivae and EOM are normal               Nose: without d/c or deformity               Neck: Neck supple. Gross normal ROM               Cardiovascular: Normal rate and regular rhythm.                 Pulmonary/Chest: Effort normal and breath sounds without rales or wheezing.                Abd:  Soft, NT, ND, + BS, no organomegaly               Neurological: Pt is alert. At baseline orientation, motor grossly intact               Skin: Skin  is warm. No rashes, no other new lesions, LE edema - trace bilateral               Psychiatric: Pt behavior is normal without agitation   Micro: none  Cardiac tracings I have personally interpreted today:  none  Pertinent  Radiological findings (summarize): none   Lab Results  Component Value Date   WBC 7.4 09/17/2021   HGB 11.3 (L) 09/17/2021   HCT 38.5 09/17/2021   PLT 266 09/17/2021   GLUCOSE 98 09/17/2021   CHOL 185 11/11/2020   TRIG 169 (H) 11/11/2020   HDL 53 11/11/2020   LDLDIRECT 141.0 03/02/2016   LDLCALC 103 (H) 11/11/2020   ALT 17 09/14/2021   AST 16 09/14/2021   NA 136 09/17/2021   K 3.7 09/17/2021   CL 96 (L) 09/17/2021   CREATININE 1.12 (H) 09/17/2021   BUN 20 09/17/2021   CO2 27 09/17/2021   TSH 2.94 01/08/2021   INR 1.03 03/25/2017   HGBA1C 5.9 (H) 11/11/2020   MICROALBUR 8.05 (H) 06/26/2007   Assessment/Plan:  ZAYLI VILLAFUERTE is a 40 y.o. White or Caucasian [1] female with  has a past medical history of Acute renal failure (ARF) ([place]) (11/2012), Anemia, Cellulitis, Chronic diastolic CHF (congestive heart failure) ([place]), Depression, Diabetes mellitus ([place]), GERD (gastroesophageal reflux disease), Hyperlipidemia, Hypertension, Hypothyroidism, Morbid obesity with BMI of 70 and over, adult ([place]), Obesity hypoventilation syndrome ([place]), OSA (obstructive sleep apnea), PVC's (premature ventricular contractions), Recurrent cellulitis of lower leg, Sepsis ([place]) (11/2012), Sleep apnea, and Venous insufficiency of leg.  Acute dyspnea I suspect related to supermorbid obesity, pt plans to f/u pulm later today  Chronic diastolic CHF (congestive heart failure) (HCC) Overall volume stable, cont current med tx - demadex, aldactone  Eczema, dyshidrotic Mild to mod, for steroid cream prn,  to f/u any worsening symptoms or concerns  Chronic pain Back and knee pain - for tramadol restart, f/u sport medicine  Vitamin D deficiency Last vitamin D Lab Results  Component Value Date   VD25OH 30.2 11/11/2020   Low, to start oral replacement  Followup: No follow-ups on file.  Cathlean Cower, MD 09/19/2021 5:08 PM [facility]

## 2021-09-15 NOTE — Assessment & Plan Note (Addendum)
-   Download from 08/19/21-09/17/21 showed that patient is 97% compliant with CPAP use > 4 hours - Pressure 15cm h20; Residual AHI 4.1 - No changes today, DME order placed for new machine

## 2021-09-15 NOTE — Progress Notes (Signed)
@Patient  ID: Alexandra Beck, female    DOB: [birth year], 40 y.o.   MRN: [id number]  Chief Complaint  Patient presents with   Follow-up    SOB: has worsened     Referring provider: Marrian Salvage,*  HPI: 39 year old female, former smoker quit in 2011 (<10 pack year hx). PMH significant for OSA, obesity hypoventilation syndrome, narcolepsy without cataplexy, delayed sleep phase, chronic diastolic heart failure. Patient of Dr. Elsworth Soho. NPSG 2009:  AHI 93/hr. CPAP titration 05/2018 >> CPAP 15 + 3L O2 (434 lbs ). Emphasized importance of sleep routine and scheduled bed/wake time. Enc bedtime around 1-2 AM. Recommended light exposure in the morning. Maintained on Provigil 100mg  three times daily. Continues CPAP at 15cm H20.   Previous [facility] pulmonary encounters: 06/06/2019 Patient contacted today for video visit for OSA follow-up. She is doing well. Compliant with CPAP, wearing every night. No issues with pressure setting, does lose mask seal. States that she is not the best at replacing CPAP parts. Reports benefit from Provigil medication.  Typically works 1pm-10pm but since [place] pandemic she has been working 10am-7pm. She still goes to bed around 1:30-2am and wakes up around 9-9:30am. She still has a hard time getting up in the moring but medication allows her to stay awake. Takes 300mg  at once in the morning. She can feel a difference if she forgets to take, states that she is ready to fall asleep. [facility]- has an apt next Thursday.   [employer]view download: 30/30 day used; 93% >4 hours Average usage 6 hours 46 mins Pressure 15cm h20 Leaks 79.9 L/min (95%) AHI 6.1  04/03/2020 Patient contacted today for annual follow-up. She just replaced mask, uses full face mask. The velco on her headgear will loosen. She has some airleaks. She is sleeping ok at night. No trouble falling or staying asleep. She has been trying to go to bed earlier. 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 She gets off of work at [place] to get to bed before 1am and wakes up around 9am. She continues to take modafinil 300mg  in the morning. She has been on CPAP for approx 10 years. She continues to work on weight loss. She has lost approx 50-60 lbs with weight loss clinic.   Airview download: 30/30 days used (100%); 29 days > 4 hours. Average usage 7 hours 26 mins. Pressure 15cm h20. Air leaks 44.8L/min (95%). AHI 4.1  03/05/2021  Patient presents today for 1 year follow-up. No issues with mask fit or pressure 15cm. She has been using her CPAP every night with 2L oxygen. Sleeping well, she gets on average 7 hours a night. Reports mouth dryness, states that her mouth comes open at night. She sleeps on her side. She uses a full face mask. She can only get elastic headgear every 6 months from her DME company.  She works from [employer] at [employer]. She reports noticeable daytime sleepiness if she does not take her modafanil medication. Epworth sleepiness scale 4. DME is [employer], patient needs to be re-enrolled in airview- they will email [place] a copy of her compliance report.    09/15/2021- interim hx  Patient presents today for acute visit with reports for shortness of breath. This is new for her and will need to be worked up. We follow her in office for hx sleep apnea. She has a hx heart failure and maintained on Torsemide and aldactone. She was seen by cardiology  on 09/08/21 and felt to have symptoms of volume overload. Torsemide was increased for 3 days to 30mg  twice daily. ordered for CXR and echocardiogram. CXR on 09/10/21 fullness in the central vasculature. Echocardiogram is scheduled for 09/27/21. CBC showed mild anemia, she saw her PCP today was order   She can not walk far without becoming seriously winded and having to stop to catch her breath. When she can not breath her anxiety worsens. She has some wheezing. She may look into short term disability.  She is compliant with  her CPAP machine, using 97% > 4 hours. She received message stating that her machine had reached its lifetime warranty for her machines.   Allergies  Allergen Reactions   Aspirin Hives and Swelling    REACTION: throat swelling, hives Other reaction(s): Unknown   Doxycycline Other (See Comments)    Abdominal pain Other reaction(s): Unknown   Lisinopril Cough   Niacin And Related     Other reaction(s): Unknown   Niaspan [Niacin Er]     Caused flushing    Immunization History  Administered Date(s) Administered   Influenza,inj,Quad PF,6+ Mos 08/02/2013, 08/28/2016, 10/11/2017, 07/23/2019   PFIZER(Purple Top)SARS-COV-2 Vaccination 02/06/2020, 02/27/2020   Pneumococcal Polysaccharide-23 12/28/2012   Tdap 08/26/2015    Past Medical History:  Diagnosis Date   Acute renal failure (ARF) ([place]) 11/2012    multifactorial-likely secondary to ATN in the setting of sepsis and hypotension, and also from rhabdomyolysis   Anemia    Cellulitis    Chronic diastolic CHF (congestive heart failure) ([place])    Depression    Diabetes mellitus (HCC)    GERD (gastroesophageal reflux disease)    Hyperlipidemia    Hypertension    Hypothyroidism    Morbid obesity with BMI of 70 and over, adult ([place])    Obesity hypoventilation syndrome (HCC)    OSA (obstructive sleep apnea)    PVC's (premature ventricular contractions)    Recurrent cellulitis of lower leg    LLE, venous insuff   Sepsis ([place]) 11/2012   Secondary to cellulitis   Sleep apnea    Venous insufficiency of leg     Tobacco History: Social History   Tobacco Use  Smoking Status Former   Packs/day: 0.50   Years: 8.00   Pack years: 4.00   Types: Cigarettes   Quit date: 2011   Years since quitting: 11.8  Smokeless Tobacco Never   Counseling given: Not Answered   Outpatient Medications Prior to Visit  Medication Sig Dispense Refill   ARIPiprazole (ABILIFY) 5 MG tablet Take 1 tablet (5 mg total) by mouth daily. 30 tablet 5    buPROPion (WELLBUTRIN XL) 150 MG 24 hr tablet Take three tablets every morning. 90 tablet 5   cyclobenzaprine (FLEXERIL) 5 MG tablet Take 1 tablet (5 mg total) by mouth 3 (three) times daily as needed for muscle spasms. 90 tablet 1   levothyroxine (SYNTHROID) 150 MCG tablet TAKE 1 TABLET(150 MCG) BY MOUTH DAILY BEFORE AND BREAKFAST 90 tablet 2   losartan (COZAAR) 100 MG tablet Take 1 tablet (100 mg total) by mouth daily. Please schedule appt for future refills. Thank you 90 tablet 0   metFORMIN (GLUCOPHAGE) 500 MG tablet TAKE 1 TABLET(500 MG) BY MOUTH DAILY 90 tablet 3   metoprolol succinate (TOPROL-XL) 50 MG 24 hr tablet TAKE 1 TABLET BY MOUTH DAILY WITH OR IMMEDIATELY FOLLOWING A MEAL 90 tablet 3   modafinil (PROVIGIL) 100 MG tablet TAKE 1 TABLET BY MOUTH DAILY AROUND 2 PM 30  tablet 1   modafinil (PROVIGIL) 200 MG tablet TAKE 1 TABLET(200 MG) BY MOUTH DAILY AT 9 AM 60 tablet 1   rosuvastatin (CRESTOR) 10 MG tablet TAKE 1 TABLET(10 MG) BY MOUTH DAILY 90 tablet 3   sertraline (ZOLOFT) 100 MG tablet Take 1 tablet (100 mg total) by mouth 2 (two) times daily. 60 tablet 5   spironolactone (ALDACTONE) 25 MG tablet Take 1 tablet (25 mg total) by mouth daily. 90 tablet 0   tirzepatide (MOUNJARO) 2.5 MG/0.5ML Pen Inject 2.5 mg into the skin once a week. 2 mL 0   torsemide (DEMADEX) 20 MG tablet Take 2 tablets (40 mg total) by mouth daily. 180 tablet 0   traMADol (ULTRAM) 50 MG tablet Take 1 tablet (50 mg total) by mouth every 8 (eight) hours as needed. 20 tablet 0   triamcinolone cream (KENALOG) 0.1 % Apply 1 application topically 2 (two) times daily. 30 g 0   No facility-administered medications prior to visit.   Review of Systems  Review of Systems  Constitutional: Negative.   HENT: Negative.    Respiratory:  Positive for shortness of breath and wheezing.   Cardiovascular: Negative.     Physical Exam  BP (!) 150/82 (BP Location: Left Wrist, Cuff Size: Large)   Pulse 68   Temp 98.3 F (36.8  C)   Ht 5\' 2"  (1.575 m)   Wt (!) 467 lb (211.8 kg)   SpO2 95%   BMI 85.42 kg/m  Physical Exam Constitutional:      General: She is not in acute distress.    Appearance: Normal appearance. She is obese.  HENT:     Head: Normocephalic and atraumatic.     Mouth/Throat:     Mouth: Mucous membranes are moist.     Pharynx: Oropharynx is clear.  Cardiovascular:     Rate and Rhythm: Normal rate and regular rhythm.  Pulmonary:     Effort: Pulmonary effort is normal.     Breath sounds: Normal breath sounds. No wheezing, rhonchi or rales.  Musculoskeletal:        General: Normal range of motion.  Skin:    General: Skin is warm and dry.  Neurological:     General: No focal deficit present.     Mental Status: She is alert and oriented to person, place, and time. Mental status is at baseline.  Psychiatric:        Mood and Affect: Mood normal.        Behavior: Behavior normal.        Thought Content: Thought content normal.        Judgment: Judgment normal.     Lab Results:  CBC    Component Value Date/Time   WBC 6.3 09/14/2021 1332   WBC 7.9 03/23/2018 1224   RBC 3.96 09/14/2021 1332   RBC 5.65 (H) 03/23/2018 1224   HGB 10.7 (L) 09/14/2021 1332   HCT 34.3 09/14/2021 1332   PLT 229 09/14/2021 1332   MCV 87 09/14/2021 1332   MCH 27.0 09/14/2021 1332   MCH 26.8 04/19/2017 0419   MCHC 31.2 (L) 09/14/2021 1332   MCHC 31.6 03/23/2018 1224   RDW 15.0 09/14/2021 1332   LYMPHSABS 1.4 07/25/2018 1234   MONOABS 0.7 04/16/2017 2351   EOSABS 0.2 07/25/2018 1234   BASOSABS 0.0 07/25/2018 1234    BMET    Component Value Date/Time   NA 141 09/14/2021 1331   K 4.4 09/14/2021 1331   CL 100 09/14/2021 1331  CO2 23 09/14/2021 1331   GLUCOSE 150 (H) 09/14/2021 1331   GLUCOSE 117 (H) 03/23/2018 1224   BUN 26 (H) 09/14/2021 1331   CREATININE 1.11 (H) 09/14/2021 1331   CREATININE 1.06 11/14/2016 1050   CALCIUM 9.3 09/14/2021 1331   CALCIUM 9.6 06/26/2007 0000   GFRNONAA 71  11/11/2020 1211   GFRAA 81 11/11/2020 1211    BNP    Component Value Date/Time   BNP 80.9 09/07/2021 1500    ProBNP    Component Value Date/Time   PROBNP 145 (H) 03/26/2020 1602   PROBNP 641.0 (H) 08/30/2016 1413    Imaging: DG Chest 2 View  Result Date: 09/10/2021 CLINICAL DATA:  40 year old female with shortness of breath EXAM: CHEST - 2 VIEW COMPARISON:  01/02/2018 FINDINGS: Cardiomediastinal silhouette unchanged in size and contour. Interlobular septal thickening. Fullness in the central vasculature. No pneumothorax or pleural effusion. No acute displaced fracture. Degenerative changes of the spine. IMPRESSION: Evidence of early CHF. Electronically Signed   By: Corrie Mckusick D.O.   On: 09/10/2021 08:49     Assessment & Plan:   Acute dyspnea - Patient reports new and worsen dyspnea symptoms for the last 2 week. She has hx HF, BNP was recently normal. CXR on 10/14 showed fullness in the central vasculature. She was instructed by cardiology to increase Torsemide x 3 days but reports no clinical benefit. She is mildly anemic and is currently under going work up with PCP. She is also scheduled for an echocardiogram on 09/27/21. Recommend checking  D-dimer to rule out PE and needs pulmonary function testing to assess for obstructive lung disease. Given therapeutic trial Advair 250-58mcg 1 puff every 12 hours. She will have a virtual visit with me in 2 week to assess response and is scheduled to see Dr. Elsworth Soho in November with PFTs prior.   OSA (obstructive sleep apnea) - Download from 08/19/21-09/17/21 showed that patient is 97% compliant with CPAP use > 4 hours - Pressure 15cm h20; Residual AHI 4.1 - No changes today, DME order placed for new machine   Chronic diastolic CHF (congestive heart failure) (HCC) - BNP 80 on 10/11 - Maintained on Torsemide 40mg  daily and Aldactone 25mg  daily  40 mins spent on case; >50 % face to face with patient   Martyn Ehrich, NP 09/15/2021

## 2021-09-15 NOTE — Patient Instructions (Addendum)
  Recommendations: - Start Advair 250-38mcg one puffs morning and evening  - Use Albuterol 2 puffs every 6 hours as needed for breakthrough sob/wheezing  - Continue Torsemide 40mg  daily - Echocardiogram is scheduled for 09/27/21  Orders: - Labs [facility]) today  - PFTs re: sob   Follow-up: - Please schedule virtual visit with Beth in 2 weeks  AND scheduled patient to see Dr. Elsworth Soho in November

## 2021-09-16 ENCOUNTER — Other Ambulatory Visit: Payer: BC Managed Care – PPO

## 2021-09-16 DIAGNOSIS — R0602 Shortness of breath: Secondary | ICD-10-CM

## 2021-09-16 NOTE — Telephone Encounter (Signed)
RA please advise on refilling the Modafinil 200 mg as well.  Pt stated that this was denied.

## 2021-09-17 ENCOUNTER — Other Ambulatory Visit: Payer: Self-pay

## 2021-09-17 ENCOUNTER — Other Ambulatory Visit (HOSPITAL_COMMUNITY): Payer: Self-pay

## 2021-09-17 ENCOUNTER — Telehealth: Payer: Self-pay

## 2021-09-17 ENCOUNTER — Encounter (HOSPITAL_COMMUNITY): Payer: Self-pay | Admitting: Emergency Medicine

## 2021-09-17 ENCOUNTER — Telehealth: Payer: Self-pay | Admitting: Acute Care

## 2021-09-17 ENCOUNTER — Ambulatory Visit (HOSPITAL_BASED_OUTPATIENT_CLINIC_OR_DEPARTMENT_OTHER): Admission: RE | Admit: 2021-09-17 | Payer: BC Managed Care – PPO | Source: Ambulatory Visit

## 2021-09-17 ENCOUNTER — Emergency Department (HOSPITAL_COMMUNITY)
Admission: EM | Admit: 2021-09-17 | Discharge: 2021-09-18 | Disposition: A | Discharge status: Home / Self Care | Payer: BC Managed Care – PPO | Attending: Emergency Medicine | Admitting: Emergency Medicine

## 2021-09-17 ENCOUNTER — Telehealth: Payer: Self-pay | Admitting: Pharmacy Technician

## 2021-09-17 ENCOUNTER — Telehealth (HOSPITAL_BASED_OUTPATIENT_CLINIC_OR_DEPARTMENT_OTHER): Payer: Self-pay

## 2021-09-17 DIAGNOSIS — Z87891 Personal history of nicotine dependence: Secondary | ICD-10-CM | POA: Insufficient documentation

## 2021-09-17 DIAGNOSIS — R609 Edema, unspecified: Secondary | ICD-10-CM | POA: Insufficient documentation

## 2021-09-17 DIAGNOSIS — E119 Type 2 diabetes mellitus without complications: Secondary | ICD-10-CM | POA: Diagnosis not present

## 2021-09-17 DIAGNOSIS — I110 Hypertensive heart disease with heart failure: Secondary | ICD-10-CM | POA: Diagnosis not present

## 2021-09-17 DIAGNOSIS — Z7984 Long term (current) use of oral hypoglycemic drugs: Secondary | ICD-10-CM | POA: Diagnosis not present

## 2021-09-17 DIAGNOSIS — I5032 Chronic diastolic (congestive) heart failure: Secondary | ICD-10-CM | POA: Insufficient documentation

## 2021-09-17 DIAGNOSIS — Z79899 Other long term (current) drug therapy: Secondary | ICD-10-CM | POA: Insufficient documentation

## 2021-09-17 DIAGNOSIS — R0789 Other chest pain: Secondary | ICD-10-CM | POA: Insufficient documentation

## 2021-09-17 DIAGNOSIS — R7989 Other specified abnormal findings of blood chemistry: Secondary | ICD-10-CM | POA: Insufficient documentation

## 2021-09-17 DIAGNOSIS — R0602 Shortness of breath: Secondary | ICD-10-CM | POA: Insufficient documentation

## 2021-09-17 DIAGNOSIS — R0600 Dyspnea, unspecified: Secondary | ICD-10-CM | POA: Diagnosis not present

## 2021-09-17 DIAGNOSIS — J9611 Chronic respiratory failure with hypoxia: Secondary | ICD-10-CM | POA: Diagnosis not present

## 2021-09-17 DIAGNOSIS — E039 Hypothyroidism, unspecified: Secondary | ICD-10-CM | POA: Diagnosis not present

## 2021-09-17 LAB — CBC WITH DIFFERENTIAL/PLATELET
Abs Immature Granulocytes: 0.04 10*3/uL (ref 0.00–0.07)
Basophils Absolute: 0.1 10*3/uL (ref 0.0–0.1)
Basophils Relative: 1 %
Eosinophils Absolute: 0.3 10*3/uL (ref 0.0–0.5)
Eosinophils Relative: 3 %
HCT: 38.5 % (ref 36.0–46.0)
Hemoglobin: 11.3 g/dL — ABNORMAL LOW (ref 12.0–15.0)
Immature Granulocytes: 1 %
Lymphocytes Relative: 24 %
Lymphs Abs: 1.8 10*3/uL (ref 0.7–4.0)
MCH: 26.2 pg (ref 26.0–34.0)
MCHC: 29.4 g/dL — ABNORMAL LOW (ref 30.0–36.0)
MCV: 89.1 fL (ref 80.0–100.0)
Monocytes Absolute: 0.7 10*3/uL (ref 0.1–1.0)
Monocytes Relative: 9 %
Neutro Abs: 4.7 10*3/uL (ref 1.7–7.7)
Neutrophils Relative %: 62 %
Platelets: 266 10*3/uL (ref 150–400)
RBC: 4.32 MIL/uL (ref 3.87–5.11)
RDW: 15.7 % — ABNORMAL HIGH (ref 11.5–15.5)
WBC: 7.4 10*3/uL (ref 4.0–10.5)
nRBC: 0 % (ref 0.0–0.2)

## 2021-09-17 LAB — I-STAT BETA HCG BLOOD, ED (MC, WL, AP ONLY): I-stat hCG, quantitative: <5 m[IU]/mL (ref <5)

## 2021-09-17 LAB — D-DIMER, QUANTITATIVE: D-Dimer, Quant: 1.28 mcg/mL FEU — ABNORMAL HIGH (ref <0.50)

## 2021-09-17 MED ORDER — MODAFINIL 200 MG PO TABS: ORAL_TABLET | ORAL | 3 refills | Status: DC

## 2021-09-17 NOTE — Progress Notes (Signed)
Patient needs stat CTA re: elevated D-dimer

## 2021-09-17 NOTE — ED Provider Notes (Signed)
Emergency Medicine Provider Triage Evaluation Note  Alexandra Beck , a 40 y.o. female  was evaluated in triage.  Pt complains of shortness of breath.  Patient reports that she has been short of breath for the last 3 weeks.  Shortness of breath has been mildly worsening since onset.  She also endorses an aching, dull chest pain in the center of her chest, that she describes as discomfort.  She is unsure if it is pleuritic.  She is followed by pulmonology who ordered a D-dimer, which was elevated.  She was advised to have a CT PE study.  She thought that she would not be scheduled for a PE study until early next week and was concerned so she came to the emergency department for further evaluation.  No fever, chill, cough, vomiting, rash.  Review of Systems  Positive: Shortness of breath, chest pain Negative: Fever, chills, vomiting, rash  Physical Exam  BP (!) 149/89 (BP Location: Left Arm)   Pulse 71   Temp 98.3 F (36.8 C) (Oral)   Resp 20   Ht 5\' 2"  (1.575 m)   Wt (!) 230 kg   LMP 09/09/2021   SpO2 94%   BMI 92.74 kg/m  Gen:   Awake, no distress   Resp:  Normal effort  MSK:   Moves extremities without difficulty  Other:  Numerous varicose veins noted to the bilateral lower extremities.   Medical Decision Making  Medically screening exam initiated at 11:04 PM.  Appropriate orders placed.  Alexandra Beck was informed that the remainder of the evaluation will be completed by another provider, this initial triage assessment does not replace that evaluation, and the importance of remaining in the ED until their evaluation is complete.  Labs imaging have been ordered.  She will require further work-up and evaluation in the emergency department   Neisha Hinger, [place], PA-C 09/17/21 2304    Gareth Morgan, MD 09/20/21 1816

## 2021-09-17 NOTE — Progress Notes (Signed)
Needs stat CTA re: elevated D-dimer

## 2021-09-17 NOTE — Telephone Encounter (Signed)
Called and spoke to patient about Positive D Dimer result. Per Derl Barrow she would like patient to have a CTA. I have placed(STAT).  Will send to [facility]  in [place].

## 2021-09-17 NOTE — Telephone Encounter (Signed)
Yes I spoke with her, thank you

## 2021-09-17 NOTE — ED Triage Notes (Signed)
Patient advised by her MD to go to ER for CT scan due to elevated D-dimer result taken yesterday , she added SOB for 3 weeks .

## 2021-09-17 NOTE — Telephone Encounter (Signed)
Notified by CT department at [facility] via PCCM consult pager that the patient is not going to [place] appointment at [facility] and instead plans to present to [place] MSN, AGACNP-BC [place] 09/17/2021, 5:41 PM

## 2021-09-17 NOTE — Telephone Encounter (Signed)
Patient Advocate Encounter  Received notification from [place] that prior authorization for [facility] 200MG  is required.   PA submitted on 10.21.22 Key [id number] Status is pending   [facility] will continue to follow  Luciano Cutter, CPhT Patient Advocate Phone: [phone] Fax:  [phone]

## 2021-09-18 ENCOUNTER — Emergency Department (HOSPITAL_COMMUNITY): Payer: BC Managed Care – PPO

## 2021-09-18 ENCOUNTER — Emergency Department (HOSPITAL_BASED_OUTPATIENT_CLINIC_OR_DEPARTMENT_OTHER): Payer: BC Managed Care – PPO

## 2021-09-18 DIAGNOSIS — R0600 Dyspnea, unspecified: Secondary | ICD-10-CM

## 2021-09-18 DIAGNOSIS — J9611 Chronic respiratory failure with hypoxia: Secondary | ICD-10-CM

## 2021-09-18 DIAGNOSIS — R0609 Other forms of dyspnea: Secondary | ICD-10-CM

## 2021-09-18 DIAGNOSIS — I5032 Chronic diastolic (congestive) heart failure: Secondary | ICD-10-CM

## 2021-09-18 LAB — ECHOCARDIOGRAM COMPLETE
Area-P 1/2: 3.12 cm2
Height: 62 in
Weight: 7641.6 oz

## 2021-09-18 LAB — BRAIN NATRIURETIC PEPTIDE: B Natriuretic Peptide: 36.5 pg/mL (ref 0.0–100.0)

## 2021-09-18 LAB — BASIC METABOLIC PANEL
Anion gap: 13 (ref 5–15)
BUN: 20 mg/dL (ref 6–20)
CO2: 27 mmol/L (ref 22–32)
Calcium: 9.1 mg/dL (ref 8.9–10.3)
Chloride: 96 mmol/L — ABNORMAL LOW (ref 98–111)
Creatinine, Ser: 1.12 mg/dL — ABNORMAL HIGH (ref 0.44–1.00)
GFR, Estimated: >60 mL/min (ref >60)
Glucose, Bld: 98 mg/dL (ref 70–99)
Potassium: 3.7 mmol/L (ref 3.5–5.1)
Sodium: 136 mmol/L (ref 135–145)

## 2021-09-18 MED ORDER — IOHEXOL 350 MG/ML SOLN
75.0000 mL | Freq: Once | INTRAVENOUS | Status: AC | PRN
  Administered 2021-09-18: 75 mL via INTRAVENOUS

## 2021-09-18 MED ORDER — PERFLUTREN LIPID MICROSPHERE
1.0000 mL | INTRAVENOUS | Status: AC | PRN
  Administered 2021-09-18: 4 mL via INTRAVENOUS
  Filled 2021-09-18: qty 10

## 2021-09-18 NOTE — ED Notes (Signed)
Walked patient with pulse ox. Around department sats 88%-90%

## 2021-09-18 NOTE — Consult Note (Signed)
Cardiology Consultation:   Patient ID: JENESYS CASSEUS MRN: [id number]; DOB: 04-12-1981  Admit date: 09/17/2021 Date of Consult: 09/18/2021  PCP:  Biagio Borg, MD   [facility] Providers Cardiologist:  Larae Grooms, MD      Patient Profile:   AVRIE KEDZIERSKI is a 40 y.o. female with a hx of chronic hypoxic respiratory failure, chronic diastolic heart failure and morbid obesity who is being seen 09/18/2021 for the evaluation of hypoxia at the request of Eustaquio Maize, PA-C.   History of Present Illness:   Ms. Tout presented to the hospital after a D-dimer test was abnormal for further evaluation.  The patient had previously been seen by Dr Irish Lack in the outpatient clinic on September 08, 2021.  This appointment was for shortness of breath.  In the assessment and plan of his note he mentions that other etiologies will have to be investigated if there was no improvement with additional diuresis.  At that appointment he increased her diuretics which did not improve her symptoms of shortness of breath.  Referral was placed to pulmonology.  She saw her pulmonologist in office on September 15, 2021 for shortness of breath and hypoxia.  The NP during that visit ordered a D-dimer and gave her a trial of Advair.  The D-dimer returned positive so she was ordered a CT PE protocol.  She presented to the emergency department to have this completed.  ER evaluation was negative for PE on the CT scan.  CT scan also did not show significant pulmonary edema.  There is no pleural effusions.  No significant change in her weight.  Today she tells me that there was no change in her shortness of breath with the increased diuretic that Dr Irish Lack had previously prescribed.  She uses an oxygen concentrator at night only.  She has not been using it during the daytime hours.  She is compliant with her CPAP machine.   Past Medical History:  Diagnosis Date   Acute renal failure (ARF) ([place]) 11/2012     multifactorial-likely secondary to ATN in the setting of sepsis and hypotension, and also from rhabdomyolysis   Anemia    Cellulitis    Chronic diastolic CHF (congestive heart failure) (HCC)    Depression    Diabetes mellitus (HCC)    GERD (gastroesophageal reflux disease)    Hyperlipidemia    Hypertension    Hypothyroidism    Morbid obesity with BMI of 70 and over, adult ([place])    Obesity hypoventilation syndrome (HCC)    OSA (obstructive sleep apnea)    PVC's (premature ventricular contractions)    Recurrent cellulitis of lower leg    LLE, venous insuff   Sepsis ([place]) 11/2012   Secondary to cellulitis   Sleep apnea    Venous insufficiency of leg     Past Surgical History:  Procedure Laterality Date   IR FLUORO GUIDE CV MIDLINE PICC RIGHT  03/28/2017   IR US GUIDE VASC ACCESS RIGHT  03/28/2017   WISDOM TOOTH EXTRACTION         Inpatient Medications: Scheduled Meds:  Continuous Infusions:  PRN Meds: perflutren lipid microspheres (DEFINITY) IV suspension  Allergies:    Allergies  Allergen Reactions   Aspirin Hives and Swelling    REACTION: throat swelling, hives Other reaction(s): Unknown   Doxycycline Other (See Comments)    Abdominal pain Other reaction(s): Unknown   Lisinopril Cough   Niacin And Related     Other reaction(s): Unknown   Niaspan [  Niacin Er]     Caused flushing    Social History:   Social History   Socioeconomic History   Marital status: Single    Spouse name: Not on file   Number of children: 0   Years of education: Not on file   Highest education level: Not on file  Occupational History   Occupation: Tree surgeon: [employer]  Tobacco Use   Smoking status: Former    Packs/day: 0.50    Years: 8.00    Pack years: 4.00    Types: Cigarettes    Quit date: 2011    Years since quitting: 11.8   Smokeless tobacco: Never  Vaping Use   Vaping Use: Never used  Substance and Sexual Activity   Alcohol use: No    Alcohol/week:  0.0 standard drinks   Drug use: No   Sexual activity: Not on file  Other Topics Concern   Not on file  Social History Narrative   Librarian, 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)   Social Determinants of Health   Financial Resource Strain: Not on file  Food Insecurity: Not on file  Transportation Needs: Not on file  Physical Activity: Not on file  Stress: Not on file  Social Connections: Not on file  Intimate Partner Violence: Not on file    Family History:    Family History  Problem Relation Age of Onset   Hypertension Mother    Diabetes Mother    High Cholesterol Mother    Thyroid disease Mother    Sleep apnea Mother    Obesity Mother    Hypertension Father    Heart disease Father        before age 19   High Cholesterol Father    Sleep apnea Father    Obesity Father      ROS:  Please see the history of present illness.   All other ROS reviewed and negative.     Physical Exam/Data:   Vitals:   09/18/21 1300 09/18/21 1400 09/18/21 1430 09/18/21 1500  BP: 134/77 138/74  128/67  Pulse: 62 63 64 69  Resp: 15 16 14 15   Temp:      TempSrc:      SpO2: 97% 95% 94% 97%  Weight:      Height:       No intake or output data in the 24 hours ending 09/18/21 1545 Last 3 Weights 09/18/2021 09/17/2021 09/15/2021  Weight (lbs) 477 lb 9.6 oz 507 lb 0.9 oz 467 lb  Weight (kg) 216.638 kg 230 kg 211.83 kg  Some encounter information is confidential and restricted. Go to Review Flowsheets activity to see all data.     Body mass index is 87.35 kg/m.  General: Morbidly obese, laying on side, no acute distress.  On nasal cannula HEENT: normal Neck: Unable to assess JVD Vascular: No carotid bruits; Distal pulses 2+ bilaterally Cardiac:  normal S1, S2; RRR; no murmur Lungs: Distant.  Clear to auscultation bilaterally, on oxygen Abd: Obese, soft, nontender, no hepatomegaly  Ext: 1+ pitting bilateral lower extremity edema that is chronic.  Chronic skin changes on  bilateral lower extremities Musculoskeletal:  No deformities, BUE and BLE strength normal and equal Skin: warm and dry  Neuro:  CNs 2-12 intact, no focal abnormalities noted Psych:  Normal affect   EKG:  The EKG was personally reviewed and demonstrates: Sinus rhythm Telemetry:  Telemetry was personally reviewed and demonstrates: Sinus rhythm  Relevant CV Studies:  Echo today shows normal biventricular function and normal valvular function.  Personally reviewed  Laboratory Data:  High Sensitivity Troponin:  No results for input(s): TROPONINIHS in the last 720 hours.   Chemistry Recent Labs  Lab 09/14/21 1331 09/17/21 2325  NA 141 136  K 4.4 3.7  CL 100 96*  CO2 23 27  GLUCOSE 150* 98  BUN 26* 20  CREATININE 1.11* 1.12*  CALCIUM 9.3 9.1  GFRNONAA  --  >60  ANIONGAP  --  13    Recent Labs  Lab 09/14/21 1331  PROT 7.2  ALBUMIN 4.1  AST 16  ALT 17  ALKPHOS 89  BILITOT 0.2   Lipids No results for input(s): CHOL, TRIG, HDL, LABVLDL, LDLCALC, CHOLHDL in the last 168 hours.  Hematology Recent Labs  Lab 09/14/21 1332 09/17/21 2325  WBC 6.3 7.4  RBC 3.96 4.32  HGB 10.7* 11.3*  HCT 34.3 38.5  MCV 87 89.1  MCH 27.0 26.2  MCHC 31.2* 29.4*  RDW 15.0 15.7*  PLT 229 266   Thyroid No results for input(s): TSH, FREET4 in the last 168 hours.  BNP Recent Labs  Lab 09/17/21 2325  BNP 36.5    DDimer  Recent Labs  Lab 09/16/21 1444  DDIMER 1.28*     Radiology/Studies:  CT Angio Chest PE W and/or Wo Contrast  Result Date: 09/18/2021 CLINICAL DATA:  Suspected pulmonary embolism. EXAM: CT ANGIOGRAPHY CHEST WITH CONTRAST TECHNIQUE: Multidetector CT imaging of the chest was performed using the standard protocol during bolus administration of intravenous contrast. Multiplanar CT image reconstructions and MIPs were obtained to evaluate the vascular anatomy. CONTRAST:  50mL OMNIPAQUE IOHEXOL 350 MG/ML SOLN COMPARISON:  July 23, 2014 FINDINGS: Cardiovascular: The segmental  and subsegmental pulmonary arteries are limited in evaluation secondary to the patient's body habitus as well as suboptimal opacification with intravenous contrast. No true intraluminal filling defects are identified. There is mild cardiomegaly. No pericardial effusion. Mediastinum/Nodes: No enlarged mediastinal, hilar, or axillary lymph nodes. Thyroid gland, trachea, and esophagus demonstrate no significant findings. Lungs/Pleura: Lungs are clear. No pleural effusion or pneumothorax. Upper Abdomen: A subcentimeter gallstone is seen within the lumen of an otherwise normal-appearing gallbladder. Musculoskeletal: Degenerative changes are noted within the thoracic spine. Review of the MIP images confirms the above findings. IMPRESSION: 1. Limited study, as described above, without evidence of pulmonary embolism. 2. Mild cardiomegaly. 3. Cholelithiasis. Electronically Signed   By: Virgina Norfolk M.D.   On: 09/18/2021 04:15   ECHOCARDIOGRAM COMPLETE  Result Date: 09/18/2021    ECHOCARDIOGRAM REPORT   Patient Name:   TOMASITA BEEVERS Date of Exam: 09/18/2021 Medical Rec #:  [id number]     Height:       62.0 in Accession #:    [id number]    Weight:       477.6 lb Date of Birth:  11/13/1981      BSA:          2.767 m Patient Age:    43 years      BP:           134/77 mmHg Patient Gender: F             HR:           67 bpm. Exam Location:  Inpatient Procedure: 2D Echo STAT ECHO Indications:    dyspnea  History:        Patient has prior history of Echocardiogram examinations, most  recent 06/08/2018. CHF, Arrythmias:PVC; Risk                 Factors:Hypertension, Diabetes, Dyslipidemia and Sleep Apnea.  Sonographer:    Johny Chess RDCS Referring Phys: [id number] YKDXIPJ VENTER  Sonographer Comments: Patient is morbidly obese and suboptimal parasternal window. Image acquisition challenging due to patient body habitus. IMPRESSIONS  1. Left ventricular ejection fraction, by estimation, is 60 to 65%. The left  ventricle has normal function. Left ventricular endocardial border not optimally defined to evaluate regional wall motion. Left ventricular diastolic parameters are consistent with Grade II diastolic dysfunction (pseudonormalization).  2. Right ventricular systolic function is normal. The right ventricular size is normal.  3. The mitral valve is normal in structure. No evidence of mitral valve regurgitation. No evidence of mitral stenosis.  4. The aortic valve was not well visualized. Aortic valve regurgitation is not visualized. No aortic stenosis is present.  5. The inferior vena cava is normal in size with greater than 50% respiratory variability, suggesting right atrial pressure of 3 mmHg. FINDINGS  Left Ventricle: Left ventricular ejection fraction, by estimation, is 60 to 65%. The left ventricle has normal function. Left ventricular endocardial border not optimally defined to evaluate regional wall motion. The left ventricular internal cavity size was normal in size. There is no left ventricular hypertrophy. Left ventricular diastolic parameters are consistent with Grade II diastolic dysfunction (pseudonormalization). Right Ventricle: The right ventricular size is normal. No increase in right ventricular wall thickness. Right ventricular systolic function is normal. Left Atrium: Left atrial size was normal in size. Right Atrium: Right atrial size was normal in size. Pericardium: There is no evidence of pericardial effusion. Mitral Valve: The mitral valve is normal in structure. No evidence of mitral valve regurgitation. No evidence of mitral valve stenosis. Tricuspid Valve: The tricuspid valve is normal in structure. Tricuspid valve regurgitation is not demonstrated. No evidence of tricuspid stenosis. Aortic Valve: The aortic valve was not well visualized. Aortic valve regurgitation is not visualized. No aortic stenosis is present. Pulmonic Valve: The pulmonic valve was normal in structure. Pulmonic valve  regurgitation is not visualized. No evidence of pulmonic stenosis. Aorta: The aortic root is normal in size and structure. Venous: The inferior vena cava is normal in size with greater than 50% respiratory variability, suggesting right atrial pressure of 3 mmHg. IAS/Shunts: No atrial level shunt detected by color flow Doppler.   Diastology LV e' medial:    8.59 cm/s LV E/e' medial:  12.5 LV e' lateral:   7.29 cm/s LV E/e' lateral: 14.7  RIGHT VENTRICLE             IVC RV S prime:     14.40 cm/s  IVC diam: 1.70 cm LEFT ATRIUM              Index        RIGHT ATRIUM           Index LA Vol (A2C):   65.1 ml  23.52 ml/m  RA Area:     16.50 cm LA Vol (A4C):   100.0 ml 36.13 ml/m  RA Volume:   45.40 ml  16.40 ml/m LA Biplane Vol: 87.8 ml  31.73 ml/m  AORTIC VALVE LVOT Vmax:   113.00 cm/s LVOT Vmean:  74.100 cm/s LVOT VTI:    0.223 m MITRAL VALVE MV Area (PHT): 3.12 cm     SHUNTS MV Decel Time: 243 msec     Systemic VTI: 0.22 m MV E velocity: 107.00  cm/s MV A velocity: 82.30 cm/s MV E/A ratio:  1.30 Candee Furbish MD Electronically signed by Candee Furbish MD Signature Date/Time: 09/18/2021/2:52:03 PM    Final      Assessment and Plan:   Chronic hypoxic respiratory failure/shortness of breath I suspect her symptoms are due to her morbid obesity.  She has normal biventricular function and no valvular dysfunction.  No pulmonary emboli or significant edema on her CT scan.  I do not suspect a cardiac cause for her hypoxia.  She may be at a point where 24-hour supplemental oxygen is required.  Recommend to the emergency department team to consult the pulmonologists for further recommendations re: oxygen, additional testing..  2.  Chronic diastolic heart failure      Longstanding issue.  Continue diuretics.  3.  Morbid obesity  Plan discussed with ER staff.  For questions or updates, please contact [place] Please consult [email or web address] for contact info under    Signed, Vickie Epley, MD  09/18/2021  3:45 PM

## 2021-09-18 NOTE — ED Provider Notes (Signed)
Care assumed from [facility], [place], at shift change, please see their notes for full documentation of patient's complaint/HPI. Briefly, pt here with complaints of dyspnea on exertion for the past 3 weeks. No chest pain. Seen by pulmonology and sent here for CTA given elevated D dimer. Results so far show negative CTA and normal BNP 36.5. Awaiting ECHO. Plan is to have cardiology evaluate patient after ECHO for further recommendations.   Per chart review/recent pulmonology visit on 10/19: Acute dyspnea - Patient reports new and worsen dyspnea symptoms for the last 2 week. She has hx HF, BNP was recently normal. CXR on 10/14 showed fullness in the central vasculature. She was instructed by cardiology to increase Torsemide x 3 days but reports no clinical benefit. She is mildly anemic and is currently under going work up with PCP. She is also scheduled for an echocardiogram on 09/27/21. Recommend checking  D-dimer to rule out PE and needs pulmonary function testing to assess for obstructive lung disease. Given therapeutic trial Advair 250-1mcg 1 puff every 12 hours. She will have a virtual visit with me in 2 week to assess response and is scheduled to see Dr. Elsworth Soho in November with PFTs prior.   Physical Exam  BP 127/60   Pulse 60   Temp 97.6 F (36.4 C) (Oral)   Resp (!) 21   Ht 5\' 2"  (1.575 m)   Wt (!) 216.6 kg   LMP 09/09/2021   SpO2 99%   BMI 87.35 kg/m   Physical Exam Vitals and nursing note reviewed.  Constitutional:      Appearance: She is not ill-appearing.  HENT:     Head: Normocephalic and atraumatic.  Eyes:     Conjunctiva/sclera: Conjunctivae normal.  Cardiovascular:     Rate and Rhythm: Normal rate and regular rhythm.  Pulmonary:     Effort: Pulmonary effort is normal.     Breath sounds: Normal breath sounds.  Skin:    General: Skin is warm and dry.     Coloration: Skin is not jaundiced.  Neurological:     Mental Status: She is alert.    ED Course/Procedures    Clinical Course as of 09/18/21 7628  Sat Sep 18, 2021  0555 Spoke with Dr. Agustin Cree.  Will order echo this morning.  Cardiology team will follow-up with the patient this morning after the echo has been complete. [MM]    Clinical Course User Index [MM] Beck, Alexandra A, PA-C    Procedures  MDM  12:42 PM Workup significantly delayed as ECHO was not ordered as STAT and therefore ECHO team was not paged. ECHO ordered has been changed at this time. Dr. Quentin Ore with cardiology will need to be re-paged once the ECHO has been complete and will come evaluate the patient. He recommends further evaluation by pulmonology for her hypoxia. While at rest she is not hypoxic; she desatted to 88-90% on RA while ambulating which appears consistent with pulmonology eval. It does appear they have been working her up close outpatient. She does have an outpatient ECHO ordered for 10/31 however overnight team in consultation with cardiology had recommended ECHO done in the ED today.   ECHO has returned normal at this time. Cardiology has evaluated the patient and has signed off as they do not think this is cardiac related.   Pulmonology to come see patient and weigh in on her shortness of breath. At shift change case signed out to Claud Kelp, PA-C, who will dispo patient accordingly.  Alexandra Maize, PA-C 09/18/21 1610    Alexandra Johns, MD 09/19/21 [phone]

## 2021-09-18 NOTE — ED Provider Notes (Signed)
  Care of patient assumed from [facility] at 1608.  Agree with history, physical exam and plan.  See their note for further details. Briefly, patient was here for dyspnea on exertion over the last 3 weeks.  No associated chest pain.  Patient is seen by pulmonology and was sent here for CTA given an elevated D-dimer.  CTA showed no signs of PE.  BNP was within normal limits.  Patient had normal echocardiogram, cardiology evaluated the patient has signed off as they do not think patient shortness of breath is cardiac related.  Physical Exam  BP 128/67   Pulse 69   Temp 97.6 F (36.4 C) (Oral)   Resp 15   Ht 5\' 2"  (1.575 m)   Wt (!) 216.6 kg   LMP 09/09/2021   SpO2 97%   BMI 87.35 kg/m   Physical Exam Vitals and nursing note reviewed.  Constitutional:      General: She is not in acute distress.    Appearance: She is morbidly obese. She is not ill-appearing, toxic-appearing or diaphoretic.  HENT:     Head: Normocephalic.  Eyes:     General: No scleral icterus.       Right eye: No discharge.        Left eye: No discharge.  Cardiovascular:     Rate and Rhythm: Normal rate.  Pulmonary:     Effort: Pulmonary effort is normal. No tachypnea, bradypnea or respiratory distress.     Breath sounds: Normal breath sounds. No stridor.     Comments: Patient speaks in full complete sentences without difficulty Skin:    General: Skin is warm and dry.  Neurological:     General: No focal deficit present.     Mental Status: She is alert.     GCS: GCS eye subscore is 4. GCS verbal subscore is 5. GCS motor subscore is 6.  Psychiatric:        Behavior: Behavior is cooperative.    ED Course/Procedures   Clinical Course as of 09/18/21 1608  Sat Sep 18, 2021  0555 Spoke with Dr. Agustin Cree.  Will order echo this morning.  Cardiology team will follow-up with the patient this morning after the echo has been complete. [MM]    Clinical Course User Index [MM] McDonald, Mia A, PA-C     Procedures  MDM   At time of handoff patient is awaiting pulmonology consult.  1700 spoke to pulmonologist Dr. Ander Slade who reviewed patient's lab work, imaging, and personally examined the patient.  Advised that patient can be discharged at this time.  Patient will need to call pulmonology office on Monday for follow-up.  No further work-up or interventions needed at this time.  Discussed results, findings, treatment and follow up. Patient advised of return precautions. Patient verbalized understanding and agreed with plan.      Loni Beckwith, PA-C 09/18/21 1712    Malvin Johns, MD 09/19/21 [phone]

## 2021-09-18 NOTE — ED Provider Notes (Signed)
[facility] EMERGENCY DEPARTMENT Provider Note   CSN: [id number] Arrival date & time: 09/17/21  2040     History Chief Complaint  Patient presents with   SOB / Elevated D-dimer    Alexandra Beck is a 40 y.o. female with history of OSA, obesity hypoventilation syndrome, chronic diastolic congestive heart failure, HLD, HTN, hypothyroidism, recurrent cellulitis of the lower leg secondary to venous insufficiency who presents the emergency department with a chief complaint of shortness of breath.  The patient reports that she has had mild exertional dyspnea for some time.  However, over the last 3 weeks, she has been having worsening exertional dyspnea.  States that she was previously able to walk several blocks, but yesterday was huffing and puffing after walking up the 4 steps into her home. She also endorses aching, dull, central chest discomfort accompanying the shortness of breath.  She also becomes increasingly anxious when she feels that she cannot breathe.  She does have oxygen to wear as needed, but has never needed to wear with activity.  The patient reports that she was seen by her pulmonology team and had an elevated D-dimer and a Beck study was recommended.  There was concern about scheduling over the weekend, and given persistent shortness of breath for the last 3 weeks, the patient presented to the emergency departments for further evaluation.    No fever, chills, cough, worse than baseline leg swelling, numbness, weakness, diaphoresis, nasal congestion, rhinorrhea, abdominal pain, nausea, vomiting or diarrhea, or back pain.  She is compliant with her home CPAP machine.  She takes torsemide and Aldactone at home.  She was last seen by cardiology on October 12 and was felt to have symptoms of volume overload.  Her home torsemide was increased for 3 days to 30 mg twice daily.  Her chest x-ray on October 14 showed fullness in the central vasculature.  Patient is scheduled  for an echocardiogram on October 31 with cardiology..  The history is provided by the patient and medical records. No language interpreter was used.      Past Medical History:  Diagnosis Date   Acute renal failure (ARF) ([place]) 11/2012    multifactorial-likely secondary to ATN in the setting of sepsis and hypotension, and also from rhabdomyolysis   Anemia    Cellulitis    Chronic diastolic CHF (congestive heart failure) ([place])    Depression    Diabetes mellitus ([place])    GERD (gastroesophageal reflux disease)    Hyperlipidemia    Hypertension    Hypothyroidism    Morbid obesity with BMI of 70 and over, adult ([place])    Obesity hypoventilation syndrome (HCC)    OSA (obstructive sleep apnea)    PVC's (premature ventricular contractions)    Recurrent cellulitis of lower leg    LLE, venous insuff   Sepsis ([place]) 11/2012   Secondary to cellulitis   Sleep apnea    Venous insufficiency of leg     Patient Active Problem List   Diagnosis Date Noted   Cellulitis and abscess of neck 05/14/2021   Diabetes mellitus ([place]) 10/19/2020   Psoriasis 12/26/2019   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   Hypersomnolence 05/11/2018   Vitamin D deficiency 05/02/2018   Cellulitis of left leg 04/17/2017   Cellulitis of right leg 01/11/2017   PVC's (premature ventricular contractions) 12/02/2016   Chronic diastolic CHF (congestive heart failure) ([place])    Acute kidney injury ([place])  Acute dyspnea 08/05/2014   Hyperlipidemia    Diabetes mellitus type 2 in obese (HCC)    Hypothyroidism    GERD (gastroesophageal reflux disease)    Depression    Venous insufficiency of leg    Recurrent cellulitis of lower leg 12/25/2012   OSA (obstructive sleep apnea) 12/25/2012   Obesity hypoventilation syndrome ([place]) 12/25/2012   Class 3 severe obesity with serious comorbidity and body mass index (BMI) greater than or equal to 70 in adult [facility]) 12/24/2012   Essential  hypertension 06/26/2007    Past Surgical History:  Procedure Laterality Date   IR FLUORO GUIDE CV MIDLINE PICC RIGHT  03/28/2017   IR US GUIDE VASC ACCESS RIGHT  03/28/2017   WISDOM TOOTH EXTRACTION       OB History     Gravida  0   Para  0   Term  0   Preterm  0   AB  0   Living  0      SAB  0   IAB  0   Ectopic  0   Multiple  0   Live Births  0           Family History  Problem Relation Age of Onset   Hypertension Mother    Diabetes Mother    High Cholesterol Mother    Thyroid disease Mother    Sleep apnea Mother    Obesity Mother    Hypertension Father    Heart disease Father        before age 46   High Cholesterol Father    Sleep apnea Father    Obesity Father     Social History   Tobacco Use   Smoking status: Former    Packs/day: 0.50    Years: 8.00    Pack years: 4.00    Types: Cigarettes    Quit date: 2011    Years since quitting: 11.8   Smokeless tobacco: Never  Vaping Use   Vaping Use: Never used  Substance Use Topics   Alcohol use: No    Alcohol/week: 0.0 standard drinks   Drug use: No    Home Medications Prior to Admission medications   Medication Sig Start Date End Date Taking? Authorizing Provider  albuterol (VENTOLIN HFA) 108 (90 Base) MCG/ACT inhaler INHALE 2 PUFFS INTO THE LUNGS EVERY 6 HOURS AS NEEDED FOR WHEEZING OR SHORTNESS OF BREATH Patient taking differently: Inhale 2 puffs into the lungs every 6 (six) hours as needed for wheezing or shortness of breath. 09/15/21  Yes Martyn Ehrich, NP  ARIPiprazole (ABILIFY) 5 MG tablet Take 1 tablet (5 mg total) by mouth daily. 06/11/21  Yes Mozingo, Berdie Ogren, NP  buPROPion (WELLBUTRIN XL) 150 MG 24 hr tablet Take three tablets every morning. Patient taking differently: Take 450 mg by mouth daily. Take three tablets every morning. 04/16/21  Yes Mozingo, Berdie Ogren, NP  cyclobenzaprine (FLEXERIL) 5 MG tablet Take 1 tablet (5 mg total) by mouth 3 (three) times daily  as needed for muscle spasms. 09/15/21  Yes Biagio Borg, MD  fluticasone-salmeterol (ADVAIR) 250-50 MCG/ACT AEPB Inhale 1 puff into the lungs every 12 (twelve) hours. 09/15/21  Yes Martyn Ehrich, NP  ibuprofen (ADVIL) 200 MG tablet Take 1,600 mg by mouth every 6 (six) hours as needed for headache or moderate pain.   Yes [provider]  levothyroxine (SYNTHROID) 150 MCG tablet TAKE 1 TABLET(150 MCG) BY MOUTH DAILY BEFORE AND BREAKFAST Patient taking differently: Take  150 mcg by mouth daily before breakfast. 06/21/21  Yes Marrian Salvage, FNP  losartan (COZAAR) 100 MG tablet Take 1 tablet (100 mg total) by mouth daily. Please schedule appt for future refills. Thank you 07/08/21  Yes Freada Bergeron, MD  metFORMIN (GLUCOPHAGE) 500 MG tablet TAKE 1 TABLET(500 MG) BY MOUTH DAILY Patient taking differently: Take 500 mg by mouth daily with breakfast. 01/08/21  Yes Marrian Salvage, FNP  metoprolol succinate (TOPROL-XL) 50 MG 24 hr tablet TAKE 1 TABLET BY MOUTH DAILY WITH OR IMMEDIATELY FOLLOWING A MEAL Patient taking differently: Take 50 mg by mouth daily. 01/08/21  Yes Marrian Salvage, FNP  modafinil (PROVIGIL) 100 MG tablet TAKE 1 TABLET BY MOUTH DAILY AROUND 2 PM Patient taking differently: Take 100 mg by mouth daily. Along with 200 mg 09/15/21  Yes Rigoberto Noel, MD  modafinil (PROVIGIL) 200 MG tablet TAKE 1 TABLET(200 MG) BY MOUTH DAILY AT 9 AM Patient taking differently: Take 200 mg by mouth daily. Along with 100 mg 09/17/21  Yes Martyn Ehrich, NP  rosuvastatin (CRESTOR) 10 MG tablet TAKE 1 TABLET(10 MG) BY MOUTH DAILY Patient taking differently: Take 10 mg by mouth daily. 01/08/21  Yes Marrian Salvage, FNP  sertraline (ZOLOFT) 100 MG tablet Take 1 tablet (100 mg total) by mouth 2 (two) times daily. Patient taking differently: Take 200 mg by mouth daily. 07/30/21  Yes Mozingo, Berdie Ogren, NP  spironolactone (ALDACTONE) 25 MG tablet Take 1 tablet  (25 mg total) by mouth daily. 08/23/21  Yes Laqueta Linden, MD  tirzepatide [facility]) 2.5 MG/0.5ML Pen Inject 2.5 mg into the skin once a week. Patient taking differently: Inject 2.5 mg into the skin every Wednesday. 08/05/21  Yes Laqueta Linden, MD  torsemide (DEMADEX) 20 MG tablet Take 2 tablets (40 mg total) by mouth daily. 08/23/21  Yes Laqueta Linden, MD  traMADol (ULTRAM) 50 MG tablet Take 1 tablet (50 mg total) by mouth every 8 (eight) hours as needed. Patient taking differently: Take 50 mg by mouth every 8 (eight) hours as needed for moderate pain. 09/15/21  Yes Biagio Borg, MD  triamcinolone cream (KENALOG) 0.1 % Apply 1 application topically 2 (two) times daily. 09/15/21 09/15/22  Biagio Borg, MD    Allergies    Aspirin, Doxycycline, Lisinopril, Niacin and related, and Niaspan [niacin er]  Review of Systems   Review of Systems  Constitutional:  Negative for activity change and fever.  Respiratory:  Positive for shortness of breath.   Cardiovascular:  Positive for chest pain.  Gastrointestinal:  Negative for abdominal pain, constipation, diarrhea, nausea and vomiting.  Genitourinary:  Negative for dysuria.  Musculoskeletal:  Negative for back pain.  Skin:  Negative for rash.  Allergic/Immunologic: Negative for immunocompromised state.  Neurological:  Negative for headaches.  Psychiatric/Behavioral:  Negative for confusion, decreased concentration, dysphoric mood, self-injury and suicidal ideas. The patient is nervous/anxious. The patient is not hyperactive.    Physical Exam Updated Vital Signs BP (!) 143/79   Pulse 63   Temp 97.6 F (36.4 C) (Oral)   Resp 15   Ht 5\' 2"  (1.575 m)   Wt (!) 216.6 kg   LMP 09/09/2021   SpO2 93%   BMI 87.35 kg/m   Physical Exam Vitals and nursing note reviewed.  Constitutional:      General: She is not in acute distress.    Appearance: She is obese. She is not ill-appearing, toxic-appearing or diaphoretic.  HENT:  Head: Normocephalic.     Nose: No congestion or rhinorrhea.  Eyes:     Conjunctiva/sclera: Conjunctivae normal.  Cardiovascular:     Rate and Rhythm: Normal rate and regular rhythm.     Heart sounds: No murmur heard.   No friction rub. No gallop.  Pulmonary:     Effort: Pulmonary effort is normal. No respiratory distress.     Comments: Lungs are clear to auscultation bilaterally.  No increased work of breathing.  Patient is able to speak in complete, fluent sentences without increased work of breathing. Abdominal:     General: There is no distension.     Palpations: Abdomen is soft. There is no mass.     Tenderness: There is no abdominal tenderness. There is no right CVA tenderness, left CVA tenderness, guarding or rebound.     Hernia: No hernia is present.  Musculoskeletal:        General: No tenderness.     Cervical back: Neck supple.     Right lower leg: No edema.     Left lower leg: No edema.     Comments: Mild pitting peripheral edema.  Numerous varicose veins noted.  Skin:    General: Skin is warm.     Coloration: Skin is not jaundiced or pale.     Findings: No rash.  Neurological:     Mental Status: She is alert.  Psychiatric:        Behavior: Behavior normal.    ED Results / Procedures / Treatments   Labs (all labs ordered are listed, but only abnormal results are displayed) Labs Reviewed  CBC WITH DIFFERENTIAL/PLATELET - Abnormal; Notable for the following components:      Result Value   Hemoglobin 11.3 (*)    MCHC 29.4 (*)    RDW 15.7 (*)    All other components within normal limits  BASIC METABOLIC PANEL - Abnormal; Notable for the following components:   Chloride 96 (*)    Creatinine, Ser 1.12 (*)    All other components within normal limits  BRAIN NATRIURETIC PEPTIDE  I-STAT BETA HCG BLOOD, ED ([facility], [facility], [facility] ONLY)    EKG None  Radiology CT Angio Chest Beck W and/or Wo Contrast  Result Date: 09/18/2021 CLINICAL DATA:  Suspected pulmonary embolism.  EXAM: CT ANGIOGRAPHY CHEST WITH CONTRAST TECHNIQUE: Multidetector CT imaging of the chest was performed using the standard protocol during bolus administration of intravenous contrast. Multiplanar CT image reconstructions and MIPs were obtained to evaluate the vascular anatomy. CONTRAST:  11mL OMNIPAQUE IOHEXOL 350 MG/ML SOLN COMPARISON:  July 23, 2014 FINDINGS: Cardiovascular: The segmental and subsegmental pulmonary arteries are limited in evaluation secondary to the patient's body habitus as well as suboptimal opacification with intravenous contrast. No true intraluminal filling defects are identified. There is mild cardiomegaly. No pericardial effusion. Mediastinum/Nodes: No enlarged mediastinal, hilar, or axillary lymph nodes. Thyroid gland, trachea, and esophagus demonstrate no significant findings. Lungs/Pleura: Lungs are clear. No pleural effusion or pneumothorax. Upper Abdomen: A subcentimeter gallstone is seen within the lumen of an otherwise normal-appearing gallbladder. Musculoskeletal: Degenerative changes are noted within the thoracic spine. Review of the MIP images confirms the above findings. IMPRESSION: 1. Limited study, as described above, without evidence of pulmonary embolism. 2. Mild cardiomegaly. 3. Cholelithiasis. Electronically Signed   By: Virgina Norfolk M.D.   On: 09/18/2021 04:15    Procedures Procedures   Medications Ordered in ED Medications  iohexol (OMNIPAQUE) 350 MG/ML injection 75 mL (75 mLs Intravenous Contrast Given 09/18/21  [place])    ED Course  I have reviewed the triage vital signs and the nursing notes.  Pertinent labs & imaging results that were available during my care of the patient were reviewed by me and considered in my medical decision making (see chart for details).  Clinical Course as of 09/18/21 0800  Sat Sep 18, 2021  0555 Spoke with Dr. Agustin Cree.  Will order echo this morning.  Cardiology team will follow-up with the patient this morning after  the echo has been complete. [MM]    Clinical Course User Index [MM] Tzipporah Nagorski, Laymond Purser, PA-C   MDM Rules/Calculators/A&P                           40 year old female with history of OSA, obesity hypoventilation syndrome, chronic diastolic congestive heart failure, HLD, HTN, hypothyroidism, recurrent cellulitis of the lower leg secondary to venous insufficiency who presents the emergency department with exertional dyspnea that has been worsening over the last 3 weeks.  She has had some aching discomfort in her chest as well, but no other associated symptoms.  She had labs performed at her pulmonologist office and had an elevated D-dimer and was advised to have a CTA.  Vital signs are stable.  Labs and imaging of been reviewed and independently interpreted by me.  Labs are stable.  No chest pain at this time a troponin was not ordered.  BNP is not elevated.  Labs are otherwise unremarkable.  CTA with no evidence of Beck.  No pleural effusions are noted.  She did notably have 1 gallstone mentioned on exam, which I informed the patient of this incidental finding.  Patient was ambulated in the emergency department and was noted to have oxygen saturation of 88-90% on room air.  States that she has previously had oxygen saturation in the upper 80s when she has been evaluated previously, but she is unsure of her baseline.  She has upcoming PFTs ordered through pulmonology.  Reviewed the patient's recent outpatient notes.  She is scheduled to have a repeat echo in 9 days.  I discussed with the on-call cardiology fellow, and since the patient is continuing to have worsening symptoms, will plan for echo this morning.  Cardiology team will follow-up and evaluate the patient after her echo has resulted.  Patient care transferred to [facility] at the end of my shift to follow-up on echo and cardiology recommendations. Patient presentation, ED course, and plan of care discussed with review of all pertinent labs and  imaging. Please see his/her note for further details regarding further ED course and disposition.   Final Clinical Impression(s) / ED Diagnoses Final diagnoses:  None    Rx / DC Orders ED Discharge Orders     None        [place]essee Perra A, PA-C 09/18/21 0800    Veryl Speak, MD 09/19/21 [phone]

## 2021-09-18 NOTE — ED Notes (Signed)
Ambulated to BR,without difficulty.

## 2021-09-18 NOTE — Consult Note (Signed)
NAME:  Alexandra Beck, MRN:  [id number], DOB:  Sep 27, 1981, LOS: 0 ADMISSION DATE:  09/17/2021, CONSULTATION DATE: 09/18/2021 REFERRING MD: Dr. [name] Copa, CHIEF COMPLAINT: Shortness of breath  History of Present Illness:  Evaluated in emergency department for shortness of breath Had a CT scan-suboptimal images because of patient's body habitus but no clear-cut large PE, no infiltrative process, no significant pulmonary congestion, no pleural effusions Recent D-dimer 1.28 Shortness of breath has been ongoing for about 3 weeks or more Came into the hospital secondary to having an acute worsening when she was unloading her car and felt she could not make it into the house Denies any new chest pain or chest discomfort  Has a history of chronic hypoxemic respiratory failure, chronic systolic heart failure, morbid obesity, obesity hypoventilation, obstructive sleep apnea-on CPAP, narcolepsy without cataplexy Sees Dr. Elsworth Soho in the office Was recently seen by Derl Barrow in the office, ordered the D-dimer that was elevated and ordered a CT that was done today  Was recently seen by cardiology in the office and diuretics was optimized -She states that this did not really change her breathing by m CT scan-uch  Her weight has been stable  Did smoke in the remote past about 10 years ago about half a pack a day-does not have a history of asthma or COPD  Pertinent  Medical History   Past Medical History:  Diagnosis Date   Acute renal failure (ARF) ([place]) 11/2012    multifactorial-likely secondary to ATN in the setting of sepsis and hypotension, and also from rhabdomyolysis   Anemia    Cellulitis    Chronic diastolic CHF (congestive heart failure) ([place])    Depression    Diabetes mellitus ([place])    GERD (gastroesophageal reflux disease)    Hyperlipidemia    Hypertension    Hypothyroidism    Morbid obesity with BMI of 70 and over, adult ([place])    Obesity hypoventilation syndrome (HCC)    OSA  (obstructive sleep apnea)    PVC's (premature ventricular contractions)    Recurrent cellulitis of lower leg    LLE, venous insuff   Sepsis ([place]) 11/2012   Secondary to cellulitis   Sleep apnea    Venous insufficiency of leg      Significant Hospital Events: Including procedures, antibiotic start and stop dates in addition to other pertinent events   CT chest-no evidence of PE  Interim History / Subjective:  Shortness of breath, no chest pain or chest discomfort Comfortable at rest, shortness of breath with activity  Objective   Blood pressure 128/73, pulse 69, temperature 97.6 F (36.4 C), temperature source Oral, resp. rate 14, height 5\' 2"  (1.575 m), weight (!) 216.6 kg, last menstrual period 09/09/2021, SpO2 94 %.       No intake or output data in the 24 hours ending 09/18/21 [place]   09/17/21 2254 09/18/21 0239  Weight: (!) 230 kg (!) 216.6 kg    Examination: General: Middle-aged lady does not appear to be in distress superobese HENT: Moist oral mucosa Lungs: Clear breath sounds, reduced air entry at bases Cardiovascular: S1-S2 appreciated Abdomen: Obese Extremities: Edema  Neuro: Alert and oriented x3 GU:   Labs reviewed -Echocardiogram results reviewed -CT scan film reviewed by myself -CBC within normal limits -Chem-7 within normal limits  Resolved Hospital Problem list     Assessment & Plan:  Shortness of breath  obesity hypoventilation Obstructive sleep apnea History of narcolepsy without cataplexy Chronic diastolic heart failure Superobesity  Concern for blood clot -CT scan negative -Not tachycardic at rest -Not tachypneic at rest -D-dimer may be falsely elevated with her morbid obesity  History of diastolic heart failure: -Grade 2 diastolic dysfunction -Continue to optimize fluid status -Diuresis as tolerated  Encouraged to use oxygen supplementation as needed at home especially with activity Monitor oximetry as  needed   May be discharged from a pulmonary perspective Encouraged to call the office if any worsening of symptoms

## 2021-09-18 NOTE — Discharge Instructions (Signed)
You came to the emergency department today to be evaluated for your shortness of breath.  The CT scan of your chest showed no signs of a blood clot.  The echocardiogram of your heart showed no abnormalities.  You were evaluated by cardiology and pulmonology in the emergency department and cleared to return home.  Please call your pulmonologist on Monday to schedule follow-up appointment.  Get help right away if: Your shortness of breath gets worse. You have shortness of breath when you are resting. You feel light-headed or you faint. You have a cough that is not controlled with medicines. You cough up blood. You have pain with breathing. You have pain in your chest, arms, shoulders, or abdomen. You have a fever. You cannot walk up stairs or exercise the way that you normally do.

## 2021-09-18 NOTE — Progress Notes (Signed)
  Echocardiogram 2D Echocardiogram has been performed.  Alexandra Beck 09/18/2021, 2:05 PM

## 2021-09-19 DIAGNOSIS — L301 Dyshidrosis [pompholyx]: Secondary | ICD-10-CM | POA: Insufficient documentation

## 2021-09-19 DIAGNOSIS — G8929 Other chronic pain: Secondary | ICD-10-CM | POA: Insufficient documentation

## 2021-09-19 NOTE — Assessment & Plan Note (Signed)
Mild to mod, for steroid cream prn,  to f/u any worsening symptoms or concerns

## 2021-09-19 NOTE — Assessment & Plan Note (Signed)
I suspect related to supermorbid obesity, pt plans to f/u pulm later today

## 2021-09-19 NOTE — Assessment & Plan Note (Signed)
Back and knee pain - for tramadol restart, f/u sport medicine

## 2021-09-19 NOTE — Assessment & Plan Note (Signed)
Last vitamin D Lab Results  Component Value Date   VD25OH 30.2 11/11/2020   Low, to start oral replacement  

## 2021-09-19 NOTE — Assessment & Plan Note (Signed)
Overall volume stable, cont current med tx - demadex, aldactone

## 2021-09-20 ENCOUNTER — Encounter (INDEPENDENT_AMBULATORY_CARE_PROVIDER_SITE_OTHER): Payer: Self-pay | Admitting: Family Medicine

## 2021-09-20 NOTE — Telephone Encounter (Signed)
Received notification from [place] regarding a prior authorization for MODAFINIL. Authorization has been APPROVED from 10.21.22 to 10.21.23.    Authorization # PA# [id number] [id number] Non-Grandfathered [id number]

## 2021-09-21 ENCOUNTER — Ambulatory Visit (INDEPENDENT_AMBULATORY_CARE_PROVIDER_SITE_OTHER): Payer: BC Managed Care – PPO | Admitting: Family Medicine

## 2021-09-21 NOTE — Telephone Encounter (Signed)
Pt had this done in the ED 09/18/21

## 2021-09-22 ENCOUNTER — Telehealth: Payer: Self-pay

## 2021-09-22 ENCOUNTER — Other Ambulatory Visit: Payer: Self-pay

## 2021-09-22 ENCOUNTER — Ambulatory Visit: Payer: BC Managed Care – PPO | Admitting: Psychology

## 2021-09-22 ENCOUNTER — Ambulatory Visit (INDEPENDENT_AMBULATORY_CARE_PROVIDER_SITE_OTHER): Payer: BC Managed Care – PPO | Admitting: Infectious Diseases

## 2021-09-22 ENCOUNTER — Encounter: Payer: Self-pay | Admitting: Infectious Diseases

## 2021-09-22 VITALS — BP 155/81 | HR 86 | Temp 99.4°F | Resp 20 | Wt >= 6400 oz

## 2021-09-22 DIAGNOSIS — L03119 Cellulitis of unspecified part of limb: Secondary | ICD-10-CM

## 2021-09-22 DIAGNOSIS — L03116 Cellulitis of left lower limb: Secondary | ICD-10-CM | POA: Diagnosis not present

## 2021-09-22 MED ORDER — CEFADROXIL 500 MG PO CAPS: 500.0000 mg | ORAL_CAPSULE | Freq: Two times a day (BID) | ORAL | 0 refills | Status: DC

## 2021-09-22 MED ORDER — CEFADROXIL 500 MG PO CAPS: 1000.0000 mg | ORAL_CAPSULE | Freq: Two times a day (BID) | ORAL | 0 refills | Status: DC

## 2021-09-22 NOTE — Assessment & Plan Note (Addendum)
Previously seen by Dr. Baxter Flattery on suppressive amox BID for flares of cellulutis. Weaned off this in 2018 when she lost weight and had diabetes under better control. She is < 24 into cellulitis flare with faint erythema encircling her lower shin with warmth and tenderness noted. Will start cefadroxil 1gm BID x 7d. Can sub cephalexin 500 mg QID for that duration if pharmacy can't get this for her today.   She will return virtually or in person for re-evaluation next Friday with Marya Amsler. I think she would be a good candidate for on demand antibiotic to abort flares since she has been off them for nearly 4 years without a flare.   Work note written - pain management discussed with rest, cool compresses, elevation of legs and tylenol.

## 2021-09-22 NOTE — Progress Notes (Signed)
Patient: Alexandra Beck  DOB: [birth year] MRN: [id number] PCP: Biagio Borg, MD    Subjective:  Alexandra Beck is a 40 y.o. female here for evaluation of cellulitis of the LLE.   Allergic to doxycycline - bad reflux/esophagitis.   She followed with Dr. Baxter Flattery in the past for recurrent cellulitis. Had in the past been on on suppressive amox BID for this - last was on this in 2018 which was weaned off after her last flare. She had been doing well from cellulitis standpoint up until she noticed with the prodromal symptoms of warm leg pain and fevers last night. This is all very familiar to her with cellulitis flares.   She has battled exertional dyspnea for about 3 weeks. Has had increased dose of her diuretic recently. She has been working with cariology and pulmonary team. Has trialed Atrovent and rescue inhaler without smoking 12 years ago about now.    Review of Systems  Constitutional:  Positive for chills and fever.  Respiratory:  Positive for shortness of breath.   Cardiovascular:  Positive for leg swelling. Negative for chest pain.  Gastrointestinal:  Negative for abdominal pain and vomiting.  Genitourinary:  Negative for dysuria.  Skin:  Positive for rash.  Neurological:  Negative for dizziness and headaches.   Past Medical History:  Diagnosis Date   Acute renal failure (ARF) ([place]) 11/2012    multifactorial-likely secondary to ATN in the setting of sepsis and hypotension, and also from rhabdomyolysis   Anemia    Cellulitis    Chronic diastolic CHF (congestive heart failure) (HCC)    Depression    Diabetes mellitus (HCC)    GERD (gastroesophageal reflux disease)    Hyperlipidemia    Hypertension    Hypothyroidism    Morbid obesity with BMI of 70 and over, adult ([place])    Obesity hypoventilation syndrome (HCC)    OSA (obstructive sleep apnea)    PVC's (premature ventricular contractions)    Recurrent cellulitis of lower leg    LLE, venous insuff   Sepsis ([place]) 11/2012    Secondary to cellulitis   Sleep apnea    Venous insufficiency of leg     Outpatient Medications Prior to Visit  Medication Sig Dispense Refill   albuterol (VENTOLIN HFA) 108 (90 Base) MCG/ACT inhaler INHALE 2 PUFFS INTO THE LUNGS EVERY 6 HOURS AS NEEDED FOR WHEEZING OR SHORTNESS OF BREATH (Patient taking differently: Inhale 2 puffs into the lungs every 6 (six) hours as needed for wheezing or shortness of breath.) 20.1 g 1   ARIPiprazole (ABILIFY) 5 MG tablet Take 1 tablet (5 mg total) by mouth daily. 30 tablet 5   buPROPion (WELLBUTRIN XL) 150 MG 24 hr tablet Take three tablets every morning. (Patient taking differently: Take 450 mg by mouth daily. Take three tablets every morning.) 90 tablet 5   cyclobenzaprine (FLEXERIL) 5 MG tablet Take 1 tablet (5 mg total) by mouth 3 (three) times daily as needed for muscle spasms. 90 tablet 1   fluticasone-salmeterol (ADVAIR) 250-50 MCG/ACT AEPB Inhale 1 puff into the lungs every 12 (twelve) hours. 60 each 1   ibuprofen (ADVIL) 200 MG tablet Take 1,600 mg by mouth every 6 (six) hours as needed for headache or moderate pain.     levothyroxine (SYNTHROID) 150 MCG tablet TAKE 1 TABLET(150 MCG) BY MOUTH DAILY BEFORE AND BREAKFAST (Patient taking differently: Take 150 mcg by mouth daily before breakfast.) 90 tablet 2   losartan (COZAAR) 100 MG tablet  Take 1 tablet (100 mg total) by mouth daily. Please schedule appt for future refills. Thank you 90 tablet 0   metFORMIN (GLUCOPHAGE) 500 MG tablet TAKE 1 TABLET(500 MG) BY MOUTH DAILY (Patient taking differently: Take 500 mg by mouth daily with breakfast.) 90 tablet 3   metoprolol succinate (TOPROL-XL) 50 MG 24 hr tablet TAKE 1 TABLET BY MOUTH DAILY WITH OR IMMEDIATELY FOLLOWING A MEAL (Patient taking differently: Take 50 mg by mouth daily.) 90 tablet 3   modafinil (PROVIGIL) 100 MG tablet TAKE 1 TABLET BY MOUTH DAILY AROUND 2 PM (Patient taking differently: Take 100 mg by mouth daily. Along with 200 mg) 30 tablet 1    modafinil (PROVIGIL) 200 MG tablet TAKE 1 TABLET(200 MG) BY MOUTH DAILY AT 9 AM (Patient taking differently: Take 200 mg by mouth daily. Along with 100 mg) 60 tablet 3   rosuvastatin (CRESTOR) 10 MG tablet TAKE 1 TABLET(10 MG) BY MOUTH DAILY (Patient taking differently: Take 10 mg by mouth daily.) 90 tablet 3   sertraline (ZOLOFT) 100 MG tablet Take 1 tablet (100 mg total) by mouth 2 (two) times daily. (Patient taking differently: Take 200 mg by mouth daily.) 60 tablet 5   spironolactone (ALDACTONE) 25 MG tablet Take 1 tablet (25 mg total) by mouth daily. 90 tablet 0   tirzepatide (MOUNJARO) 2.5 MG/0.5ML Pen Inject 2.5 mg into the skin once a week. (Patient taking differently: Inject 2.5 mg into the skin every Wednesday.) 2 mL 0   torsemide (DEMADEX) 20 MG tablet Take 2 tablets (40 mg total) by mouth daily. 180 tablet 0   traMADol (ULTRAM) 50 MG tablet Take 1 tablet (50 mg total) by mouth every 8 (eight) hours as needed. (Patient taking differently: Take 50 mg by mouth every 8 (eight) hours as needed for moderate pain.) 20 tablet 0   triamcinolone cream (KENALOG) 0.1 % Apply 1 application topically 2 (two) times daily. 30 g 0   No facility-administered medications prior to visit.     Allergies  Allergen Reactions   Aspirin Hives and Swelling    REACTION: throat swelling, hives Other reaction(s): Unknown   Doxycycline Other (See Comments)    Abdominal pain Other reaction(s): Unknown   Lisinopril Cough   Niacin And Related     Other reaction(s): Unknown   Niaspan [Niacin Er]     Caused flushing    Social History   Tobacco Use   Smoking status: Former    Packs/day: 0.50    Years: 8.00    Pack years: 4.00    Types: Cigarettes    Quit date: 2011    Years since quitting: 11.8   Smokeless tobacco: Never  Vaping Use   Vaping Use: Never used  Substance Use Topics   Alcohol use: No    Alcohol/week: 0.0 standard drinks   Drug use: No    Family History  Problem Relation Age of  Onset   Hypertension Mother    Diabetes Mother    High Cholesterol Mother    Thyroid disease Mother    Sleep apnea Mother    Obesity Mother    Hypertension Father    Heart disease Father        before age 14   High Cholesterol Father    Sleep apnea Father    Obesity Father     Objective:   Vitals:   09/22/21 1403  BP: (!) 155/81  Pulse: 86  Resp: 20  Temp: 99.4 F (37.4 C)  TempSrc: Oral  SpO2: 92%  Weight: (!) 465 lb (210.9 kg)   Body mass index is 85.05 kg/m.  Physical Exam Vitals reviewed.  Constitutional:      General: She is not in acute distress.    Appearance: She is ill-appearing. She is not toxic-appearing.     Comments: Looks like she does not feel well but not septic appearing.   Cardiovascular:     Rate and Rhythm: Normal rate and regular rhythm.  Pulmonary:     Effort: Pulmonary effort is normal.  Skin:    General: Skin is warm and dry.     Findings: Erythema (erythema, warmth to left lower leg encircling upper shin) present.  Neurological:     Mental Status: She is oriented to person, place, and time.    Lab Results: Lab Results  Component Value Date   WBC 7.4 09/17/2021   HGB 11.3 (L) 09/17/2021   HCT 38.5 09/17/2021   MCV 89.1 09/17/2021   PLT 266 09/17/2021    Lab Results  Component Value Date   CREATININE 1.12 (H) 09/17/2021   BUN 20 09/17/2021   NA 136 09/17/2021   K 3.7 09/17/2021   CL 96 (L) 09/17/2021   CO2 27 09/17/2021    Lab Results  Component Value Date   ALT 17 09/14/2021   AST 16 09/14/2021   ALKPHOS 89 09/14/2021   BILITOT 0.2 09/14/2021     Assessment & Plan:   Problem List Items Addressed This Visit       Unprioritized   Recurrent cellulitis of lower leg - Primary   Relevant Medications   cefadroxil (DURICEF) 500 MG capsule   Cellulitis of left leg    Previously seen by Dr. Baxter Flattery on suppressive amox BID for flares of cellulutis. Weaned off this in 2018 when she lost weight and had diabetes under better  control. She is < 24 into cellulitis flare with faint erythema encircling her lower shin with warmth and tenderness noted. Will start cefadroxil 1gm BID x 7d. Can sub cephalexin 500 mg QID for that duration if pharmacy can't get this for her today.   She will return virtually or in person for re-evaluation next Friday with Marya Amsler. I think she would be a good candidate for on demand antibiotic to abort flares since she has been off them for nearly 4 years without a flare.   Work note written - pain management discussed with rest, cool compresses, elevation of legs and tylenol.        Janene Madeira, MSN, NP-C [facility] [place] Pager: [phone] Office: [phone]  09/22/21  2:42 PM

## 2021-09-22 NOTE — Telephone Encounter (Signed)
Patient called with concerns for cellulitis flare up. Previously saw Terri Piedra, NP last year and did not follow up. She states she is off antibiotics and that she noticed three "hot spots of pain" on her left leg. She states she had a fever of 103.5 last night, but has since taken antipyretics and it is down to 99.   She accepts appointment with Janene Madeira, NP for this afternoon.   Beryle Flock, RN

## 2021-09-22 NOTE — Addendum Note (Signed)
Addended by: Tomi Bamberger on: 09/22/2021 05:00 PM   Modules accepted: Orders

## 2021-09-22 NOTE — Telephone Encounter (Signed)
Patient called stating that [facility] had Rx for Cefadroxil incorrect. I spoke to pharmacy tech and they do not have the dose requested for Cefadroxil in stock. I gave verbal order for Cephalexin 500 mg QID x 7 days. Patient aware and verbalized her understanding.   [place], CMA

## 2021-09-22 NOTE — Patient Instructions (Addendum)
If you cannot get your antibiotic, cefadroxil TWO Tablets TWICE a day for 7d- call me at the office and we can do Cephalexin 4 times a day instead.   I want you to start taking your antibiotics today so we can get you feeling better.   In the mean time rest, elevate your leg, cool compresses for pain control, lotion to keep the skin healthy.   I want you to come back (virtual appointment is OK) next week to talk with Marya Amsler about how you are doing and make sure you have had enough treatment. And can also talk about the plan for either resuming suppressive or having "on demand" antibiotics going forward.

## 2021-09-24 ENCOUNTER — Other Ambulatory Visit (HOSPITAL_BASED_OUTPATIENT_CLINIC_OR_DEPARTMENT_OTHER): Payer: BC Managed Care – PPO

## 2021-09-27 ENCOUNTER — Ambulatory Visit (HOSPITAL_COMMUNITY): Payer: BC Managed Care – PPO | Attending: Internal Medicine

## 2021-09-27 ENCOUNTER — Encounter (HOSPITAL_COMMUNITY): Payer: Self-pay

## 2021-09-28 ENCOUNTER — Encounter (HOSPITAL_COMMUNITY): Payer: Self-pay | Admitting: Interventional Cardiology

## 2021-09-29 ENCOUNTER — Ambulatory Visit (INDEPENDENT_AMBULATORY_CARE_PROVIDER_SITE_OTHER): Payer: BC Managed Care – PPO | Admitting: Psychology

## 2021-09-29 ENCOUNTER — Other Ambulatory Visit: Payer: Self-pay | Admitting: Pulmonary Disease

## 2021-09-29 ENCOUNTER — Other Ambulatory Visit (INDEPENDENT_AMBULATORY_CARE_PROVIDER_SITE_OTHER): Payer: Self-pay | Admitting: Family Medicine

## 2021-09-29 DIAGNOSIS — F331 Major depressive disorder, recurrent, moderate: Secondary | ICD-10-CM | POA: Diagnosis not present

## 2021-09-29 DIAGNOSIS — E1165 Type 2 diabetes mellitus with hyperglycemia: Secondary | ICD-10-CM

## 2021-09-29 NOTE — Telephone Encounter (Signed)
Alexandra Beck 

## 2021-09-30 ENCOUNTER — Telehealth: Payer: Self-pay | Admitting: Pulmonary Disease

## 2021-10-01 ENCOUNTER — Telehealth (INDEPENDENT_AMBULATORY_CARE_PROVIDER_SITE_OTHER): Payer: BC Managed Care – PPO | Admitting: Primary Care

## 2021-10-01 ENCOUNTER — Encounter: Payer: Self-pay | Admitting: Primary Care

## 2021-10-01 ENCOUNTER — Telehealth: Payer: Self-pay | Admitting: Primary Care

## 2021-10-01 DIAGNOSIS — G4733 Obstructive sleep apnea (adult) (pediatric): Secondary | ICD-10-CM | POA: Diagnosis not present

## 2021-10-01 DIAGNOSIS — Z9989 Dependence on other enabling machines and devices: Secondary | ICD-10-CM | POA: Diagnosis not present

## 2021-10-01 DIAGNOSIS — R0600 Dyspnea, unspecified: Secondary | ICD-10-CM | POA: Diagnosis not present

## 2021-10-01 NOTE — Progress Notes (Signed)
Virtual Visit via Video Note  I connected with Duwayne Heck on 10/01/21 at  2:00 PM EDT by a video enabled telemedicine application and verified that I am speaking with the correct person using two identifiers.  Location: Patient: Home Provider: Office    I discussed the limitations of evaluation and management by telemedicine and the availability of in person appointments. The patient expressed understanding and agreed to proceed.  History of Present Illness:  39 year old female, former smoker quit in 2011 (<10 pack year hx). PMH significant for OSA, obesity hypoventilation syndrome, narcolepsy without cataplexy, delayed sleep phase, chronic diastolic heart failure. Patient of Dr. Elsworth Soho. NPSG 2009:  AHI 93/hr. CPAP titration 05/2018 >> CPAP 15 + 3L O2 (434 lbs ). Emphasized importance of sleep routine and scheduled bed/wake time. Enc bedtime around 1-2 AM. Recommended light exposure in the morning. Maintained on Provigil 100mg  three times daily. Continues CPAP at 15cm H20.   Previous [facility] pulmonary encounters: 09/15/2021- Acute/ SOB  Patient presents today for acute visit with reports for shortness of breath. This is new for her and will need to be worked up. We follow her in office for hx sleep apnea. She has a hx heart failure and maintained on Torsemide and aldactone. She was seen by cardiology on 09/08/21 and felt to have symptoms of volume overload. Torsemide was increased for 3 days to 30mg  twice daily. ordered for CXR and echocardiogram. CXR on 09/10/21 fullness in the central vasculature. Echocardiogram is scheduled for 09/27/21. CBC showed mild anemia, she saw her PCP today was order   She can not walk far without becoming seriously winded and having to stop to catch her breath. When she can not breath her anxiety worsens. She has some wheezing. She may look into short term disability.  She is compliant with her CPAP machine, using 97% > 4 hours. She received message stating that her  machine had reached its lifetime warranty for her machines.   Acute dyspnea - Patient reports new and worsen dyspnea symptoms for the last 2 week. She has hx HF, BNP was recently normal. CXR on 10/14 showed fullness in the central vasculature. She was instructed by cardiology to increase Torsemide x 3 days but reports no clinical benefit. She is mildly anemic and is currently under going work up with PCP. She is also scheduled for an echocardiogram on 09/27/21. Recommend checking  D-dimer to rule out PE and needs pulmonary function testing to assess for obstructive lung disease. Given therapeutic trial Advair 250-37mcg 1 puff every 12 hours. She will have a virtual visit with me in 2 week to assess response and is scheduled to see Dr. Elsworth Soho in November with PFTs prior.    10/01/2021- Interim hx  Patient contacted today for 2 week virtual follow-up. Patient has has worsening dyspnea symptoms for the last 2-4 weeks. CXR on 10/14 showed fullness in the central vasculature. She was instructed by cardiology to increase Torsemide x 3 days but she reported no clinical benefit for additional dose. She is currently getting worked up by her PCP for mildly anemia.   During your last visit we ordered PFT/checked labs and gave patient trial Advair 250-22mcg one puffs twice daily for dyspnea symptoms. D-dimer was elevated, she had CTA that showed no evidence of PE, mild cardiomegaly. She had an echocardiogram on 10/31, EF 60-65%. Grade 2 diastolic dysfunction. Right ventricul normal size and normal systolic function.   She tried Advair for 1 week without noticeable improvement in  her breathing. She has used her albuterol twice with temporary improvement. She has been taking 80-100mg  torsemide without no significant change in dyspnea symptoms. She saw ID for cellulitis of lower legs on 10/26 and was given RX for cefadroxil 1 gram BID x 7 days.   Patient is 97% compliant with CPAP use. Current pressure 15cm h20, residual  AHI 4.1.  Modafinil 100mg  was denied    Observations/Objective:  -Appears well; she is sitting at her computer without overt shortness breath, wheezing or cough   Assessment and Plan:  Dyspnea: - Patient reports new dyspnea symptoms over the last 2-4 weeks. CTA was negative for PE, lungs clear. Mild cardiomegaly. She feels her breathing may be slightly better with increase in diuretics and treatment for her cellulitis. She saw no clinical benefit from ICS/LABA trial. She is scheduled for PFTs end of November. No changes today. Continue diuretics as prescribed.   OSA: - Patient is 97% compliant with CPAP use.  - Current pressure 15cm h20, residual AHI 4.1 - Modafinil 100mg  was denied, we are working on this   Follow Up Instructions:  November OV with PFTs    I discussed the assessment and treatment plan with the patient. The patient was provided an opportunity to ask questions and all were answered. The patient agreed with the plan and demonstrated an understanding of the instructions.   The patient was advised to call back or seek an in-person evaluation if the symptoms worsen or if the condition fails to improve as anticipated.  I provided 22 minutes of non-face-to-face time during this encounter.   Martyn Ehrich, NP

## 2021-10-01 NOTE — Telephone Encounter (Signed)
I have attempted to call the pharmacy to make sure they were able to fill the modafinil since this was approved by her insurance.  I was on hold for over 15 mins.  Will try back later.

## 2021-10-01 NOTE — Telephone Encounter (Signed)
Can we move up pulmonary function testing if possible

## 2021-10-02 NOTE — Telephone Encounter (Signed)
I have tried to call the pharmacy back today but they do not open until 10 am.  Will try back.

## 2021-10-04 NOTE — Telephone Encounter (Signed)
Called pharm and placed on long hold Called the pt to see if she was ever able to get her medication  LMTCB

## 2021-10-05 ENCOUNTER — Encounter (INDEPENDENT_AMBULATORY_CARE_PROVIDER_SITE_OTHER): Payer: Self-pay | Admitting: Family Medicine

## 2021-10-05 DIAGNOSIS — E1165 Type 2 diabetes mellitus with hyperglycemia: Secondary | ICD-10-CM

## 2021-10-05 MED ORDER — TIRZEPATIDE 2.5 MG/0.5ML ~~LOC~~ SOAJ: 2.5000 mg | SUBCUTANEOUS | 0 refills | Status: DC

## 2021-10-05 NOTE — Telephone Encounter (Signed)
Patient has been scheduled to see Dr. Jearld Shines on 11/16.

## 2021-10-05 NOTE — Telephone Encounter (Signed)
LAST APPOINTMENT DATE: 09/07/21 NEXT APPOINTMENT DATE: 10/13/21   [id number] [place], [place] - [address] [place] [address] Phone: [phone] Fax: [phone]  [place], [place] - [address] AT [place] [facility] [place] [address] Phone: [phone] Fax: [phone]  [place] [address] Phone: [phone] Fax: [phone]  Patient is requesting a refill of the following medications: Requested Prescriptions    No prescriptions requested or ordered in this encounter    Date last filled: 08/05/21 Previously prescribed by Dr Jearld Shines  Lab Results  Component Value Date   HGBA1C 5.9 (H) 11/11/2020   HGBA1C 5.6 12/17/2019   HGBA1C 5.6 07/04/2019   Lab Results  Component Value Date   MICROALBUR 8.05 (H) 06/26/2007   LDLCALC 103 (H) 11/11/2020   CREATININE 1.12 (H) 09/17/2021   Lab Results  Component Value Date   VD25OH 30.2 11/11/2020   VD25OH 38.2 12/17/2019   VD25OH 34.6 07/04/2019    BP Readings from Last 3 Encounters:  09/22/21 (!) 155/81  09/18/21 128/70  09/15/21 (!) 150/82

## 2021-10-05 NOTE — Telephone Encounter (Signed)
Attempted to call pt but unable to reach. Left message for her to return call. 

## 2021-10-05 NOTE — Telephone Encounter (Signed)
Alexandra Beck 

## 2021-10-06 ENCOUNTER — Telehealth (INDEPENDENT_AMBULATORY_CARE_PROVIDER_SITE_OTHER): Payer: BC Managed Care – PPO | Admitting: Family

## 2021-10-06 ENCOUNTER — Encounter: Payer: Self-pay | Admitting: Family

## 2021-10-06 ENCOUNTER — Ambulatory Visit (INDEPENDENT_AMBULATORY_CARE_PROVIDER_SITE_OTHER): Payer: BC Managed Care – PPO | Admitting: Psychology

## 2021-10-06 ENCOUNTER — Other Ambulatory Visit: Payer: Self-pay

## 2021-10-06 DIAGNOSIS — F331 Major depressive disorder, recurrent, moderate: Secondary | ICD-10-CM

## 2021-10-06 DIAGNOSIS — L03116 Cellulitis of left lower limb: Secondary | ICD-10-CM

## 2021-10-06 MED ORDER — CEFADROXIL 500 MG PO CAPS: 500.0000 mg | ORAL_CAPSULE | Freq: Two times a day (BID) | ORAL | 2 refills | Status: DC

## 2021-10-06 NOTE — Patient Instructions (Signed)
Nice to speak with you.   We will continue with watchful waiting at this point.   Consider elevating your legs when seated and possibly an evaluation for lymphedema.  Use the Cefadroxil as needed for flares.   Plan for follow up in 6 months or sooner if needed.   Have a great day and stay safe!

## 2021-10-06 NOTE — Progress Notes (Signed)
Subjective:    Patient ID: Alexandra Beck, female    DOB: Mar 28, 1981, 40 y.o.   MRN: [id number]  Chief Complaint  Patient presents with   Cellulitis     Virtual Visit via Telephone/Video Note   I connected with Alexandra Beck on 10/06/2021 at by video and verified that I am speaking with the correct person using two identifiers.   I discussed the limitations, risks, security and privacy concerns of performing an evaluation and management service by telephone and the availability of in person appointments. I also discussed with the patient that there may be a patient responsible charge related to this service. The patient expressed understanding and agreed to proceed.  Location:  Patient: Home Provider: Clinic   HPI:  Alexandra Beck is a 40 y.o. female with history of recurrent cellulitis who was last seen on 09/22/21 by Janene Madeira, NP with a flare of cellulitis with leg pain, warmth, and fevers. Prescribed cefadroxil for 7 days and presents today for follow up.  Alexandra Beck symptoms have resolved since completing the cephalexin which she took as she was unable to get the cefodroxil. She has been elevating Alexandra Beck legs. No further systemic symptoms of fevers or chills. Has questions about restarting Amoxicillin for prophylaxis.  Allergies  Allergen Reactions   Aspirin Hives and Swelling    REACTION: throat swelling, hives Other reaction(s): Unknown   Doxycycline Other (See Comments)    Abdominal pain Other reaction(s): Unknown   Lisinopril Cough   Niacin And Related     Other reaction(s): Unknown   Niaspan [Niacin Er]     Caused flushing      Outpatient Medications Prior to Visit  Medication Sig Dispense Refill   albuterol (VENTOLIN HFA) 108 (90 Base) MCG/ACT inhaler INHALE 2 PUFFS INTO THE LUNGS EVERY 6 HOURS AS NEEDED FOR WHEEZING OR SHORTNESS OF BREATH (Patient taking differently: Inhale 2 puffs into the lungs every 6 (six) hours as needed for wheezing or shortness of  breath.) 20.1 g 1   ARIPiprazole (ABILIFY) 5 MG tablet Take 1 tablet (5 mg total) by mouth daily. 30 tablet 5   buPROPion (WELLBUTRIN XL) 150 MG 24 hr tablet Take three tablets every morning. (Patient taking differently: Take 450 mg by mouth daily. Take three tablets every morning.) 90 tablet 5   cyclobenzaprine (FLEXERIL) 5 MG tablet Take 1 tablet (5 mg total) by mouth 3 (three) times daily as needed for muscle spasms. 90 tablet 1   fluticasone-salmeterol (ADVAIR) 250-50 MCG/ACT AEPB Inhale 1 puff into the lungs every 12 (twelve) hours. (Patient not taking: Reported on 10/01/2021) 60 each 1   ibuprofen (ADVIL) 200 MG tablet Take 1,600 mg by mouth every 6 (six) hours as needed for headache or moderate pain.     levothyroxine (SYNTHROID) 150 MCG tablet TAKE 1 TABLET(150 MCG) BY MOUTH DAILY BEFORE AND BREAKFAST (Patient taking differently: Take 150 mcg by mouth daily before breakfast.) 90 tablet 2   losartan (COZAAR) 100 MG tablet Take 1 tablet (100 mg total) by mouth daily. Please schedule appt for future refills. Thank you 90 tablet 0   metFORMIN (GLUCOPHAGE) 500 MG tablet TAKE 1 TABLET(500 MG) BY MOUTH DAILY (Patient taking differently: Take 500 mg by mouth daily with breakfast.) 90 tablet 3   metoprolol succinate (TOPROL-XL) 50 MG 24 hr tablet TAKE 1 TABLET BY MOUTH DAILY WITH OR IMMEDIATELY FOLLOWING A MEAL (Patient taking differently: Take 50 mg by mouth daily.) 90 tablet 3   modafinil (PROVIGIL) 100  MG tablet TAKE 1 TABLET BY MOUTH DAILY AROUND 2 PM (Patient taking differently: Take 100 mg by mouth daily. Along with 200 mg) 30 tablet 1   modafinil (PROVIGIL) 200 MG tablet TAKE 1 TABLET(200 MG) BY MOUTH DAILY AT 9 AM (Patient taking differently: Take 200 mg by mouth daily. Along with 100 mg) 60 tablet 3   rosuvastatin (CRESTOR) 10 MG tablet TAKE 1 TABLET(10 MG) BY MOUTH DAILY (Patient taking differently: Take 10 mg by mouth daily.) 90 tablet 3   sertraline (ZOLOFT) 100 MG tablet Take 1 tablet (100 mg  total) by mouth 2 (two) times daily. (Patient taking differently: Take 200 mg by mouth daily.) 60 tablet 5   spironolactone (ALDACTONE) 25 MG tablet Take 1 tablet (25 mg total) by mouth daily. 90 tablet 0   tirzepatide (MOUNJARO) 2.5 MG/0.5ML Pen Inject 2.5 mg into the skin once a week. 2 mL 0   torsemide (DEMADEX) 20 MG tablet Take 2 tablets (40 mg total) by mouth daily. 180 tablet 0   traMADol (ULTRAM) 50 MG tablet Take 1 tablet (50 mg total) by mouth every 8 (eight) hours as needed. (Patient taking differently: Take 50 mg by mouth every 8 (eight) hours as needed for moderate pain.) 20 tablet 0   triamcinolone cream (KENALOG) 0.1 % Apply 1 application topically 2 (two) times daily. 30 g 0   cephALEXin (KEFLEX) 500 MG capsule Take 500 mg by mouth 4 (four) times daily.     No facility-administered medications prior to visit.     Past Medical History:  Diagnosis Date   Acute renal failure (ARF) ([place]) 11/2012    multifactorial-likely secondary to ATN in the setting of sepsis and hypotension, and also from rhabdomyolysis   Anemia    Cellulitis    Chronic diastolic CHF (congestive heart failure) (HCC)    Depression    Diabetes mellitus (HCC)    GERD (gastroesophageal reflux disease)    Hyperlipidemia    Hypertension    Hypothyroidism    Morbid obesity with BMI of 70 and over, adult ([place])    Obesity hypoventilation syndrome (HCC)    OSA (obstructive sleep apnea)    PVC's (premature ventricular contractions)    Recurrent cellulitis of lower leg    LLE, venous insuff   Sepsis ([place]) 11/2012   Secondary to cellulitis   Sleep apnea    Venous insufficiency of leg      Past Surgical History:  Procedure Laterality Date   IR FLUORO GUIDE CV MIDLINE PICC RIGHT  03/28/2017   IR US GUIDE VASC ACCESS RIGHT  03/28/2017   WISDOM TOOTH EXTRACTION         Review of Systems  Constitutional:  Negative for chills, diaphoresis, fatigue and fever.  Respiratory:  Negative for cough, chest tightness,  shortness of breath and wheezing.   Cardiovascular:  Negative for chest pain.  Gastrointestinal:  Negative for abdominal pain, diarrhea, nausea and vomiting.     Objective:    Nursing note and vital signs reviewed.    Alexandra Beck appears to be doing well and in no apparent distress. Pleasant to speak with. Other physical exam limited.  Assessment & Plan:   Problem List Items Addressed This Visit       Other   Cellulitis of left leg - Primary    Alexandra Beck has completed treatment for cellulitis with 7 days of Keflex and has resolution of Alexandra Beck symptoms. Discussed options of prophylactic antibiotics versus watchful waiting. Encouraged to continue with leg  elevation when seated and may benefit from evaluation with lymphedema provider. She has regained weight which places Alexandra Beck at risk for recurrent infection.  Continue watchful waiting with on-demand cefadroxil as needed for flares. If frequency increases can reconsider suppression with amoxicillin.  Plan for follow up in 6 months or sooner if needed.         I have discontinued Jakeia E. Leer's cephALEXin. I am also having Alexandra Beck start on cefadroxil. Additionally, I am having Alexandra Beck maintain Alexandra Beck metFORMIN, rosuvastatin, metoprolol succinate, buPROPion, ARIPiprazole, levothyroxine, losartan, sertraline, spironolactone, torsemide, modafinil, traMADol, cyclobenzaprine, triamcinolone cream, fluticasone-salmeterol, albuterol, modafinil, ibuprofen, and tirzepatide.   Meds ordered this encounter  Medications   cefadroxil (DURICEF) 500 MG capsule    Sig: Take 1 capsule (500 mg total) by mouth 2 (two) times daily.    Dispense:  14 capsule    Refill:  2    Order Specific Question:   Supervising Provider    Answer:   Carlyle Basques [phone]     I discussed the assessment and treatment plan with the patient. The patient was provided an opportunity to ask questions and all were answered. The patient agreed with the plan and demonstrated an understanding of the  instructions.   The patient was advised to call back or seek an in-person evaluation if the symptoms worsen or if the condition fails to improve as anticipated.   I provided 12  minutes of non-face-to-face time during this encounter.  Follow-up: Return in about 6 months (around 04/05/2022), or if symptoms worsen or fail to improve.   Terri Piedra, MSN, FNP-C Nurse Practitioner [facility] [place] number: [phone]

## 2021-10-06 NOTE — Assessment & Plan Note (Signed)
Ms. Boice has completed treatment for cellulitis with 7 days of Keflex and has resolution of her symptoms. Discussed options of prophylactic antibiotics versus watchful waiting. Encouraged to continue with leg elevation when seated and may benefit from evaluation with lymphedema provider. She has regained weight which places her at risk for recurrent infection.  Continue watchful waiting with on-demand cefadroxil as needed for flares. If frequency increases can reconsider suppression with amoxicillin.  Plan for follow up in 6 months or sooner if needed.

## 2021-10-08 ENCOUNTER — Other Ambulatory Visit: Payer: Self-pay | Admitting: *Deleted

## 2021-10-08 ENCOUNTER — Other Ambulatory Visit: Payer: Self-pay

## 2021-10-08 MED ORDER — MODAFINIL 100 MG PO TABS
100.0000 mg | ORAL_TABLET | Freq: Every day | ORAL | 1 refills | Status: DC
  Filled 2021-12-23: qty 60, 60d supply, fill #0

## 2021-10-08 MED ORDER — LOSARTAN POTASSIUM 100 MG PO TABS
100.0000 mg | ORAL_TABLET | Freq: Every day | ORAL | 3 refills | Status: DC
  Filled 2022-03-04: qty 90, 90d supply, fill #0
  Filled 2022-06-03: qty 90, 90d supply, fill #1

## 2021-10-08 NOTE — Telephone Encounter (Signed)
Refill sent.

## 2021-10-08 NOTE — Telephone Encounter (Signed)
RA - please advise. Thanks! 

## 2021-10-13 ENCOUNTER — Other Ambulatory Visit (HOSPITAL_COMMUNITY): Payer: Self-pay

## 2021-10-13 ENCOUNTER — Ambulatory Visit (INDEPENDENT_AMBULATORY_CARE_PROVIDER_SITE_OTHER): Payer: BC Managed Care – PPO | Admitting: Family Medicine

## 2021-10-13 ENCOUNTER — Other Ambulatory Visit: Payer: Self-pay

## 2021-10-13 ENCOUNTER — Encounter (INDEPENDENT_AMBULATORY_CARE_PROVIDER_SITE_OTHER): Payer: Self-pay | Admitting: Family Medicine

## 2021-10-13 ENCOUNTER — Ambulatory Visit (INDEPENDENT_AMBULATORY_CARE_PROVIDER_SITE_OTHER): Payer: BC Managed Care – PPO | Admitting: Psychology

## 2021-10-13 VITALS — BP 150/58 | HR 66 | Temp 98.2°F | Ht 63.0 in | Wt >= 6400 oz

## 2021-10-13 DIAGNOSIS — F331 Major depressive disorder, recurrent, moderate: Secondary | ICD-10-CM | POA: Diagnosis not present

## 2021-10-13 DIAGNOSIS — R0609 Other forms of dyspnea: Secondary | ICD-10-CM

## 2021-10-13 DIAGNOSIS — E1165 Type 2 diabetes mellitus with hyperglycemia: Secondary | ICD-10-CM | POA: Diagnosis not present

## 2021-10-13 DIAGNOSIS — Z6841 Body Mass Index (BMI) 40.0 and over, adult: Secondary | ICD-10-CM | POA: Diagnosis not present

## 2021-10-13 MED ORDER — TIRZEPATIDE 2.5 MG/0.5ML ~~LOC~~ SOAJ
2.5000 mg | SUBCUTANEOUS | 0 refills | Status: DC
  Filled 2021-10-13: qty 2, 28d supply, fill #0

## 2021-10-14 ENCOUNTER — Encounter (INDEPENDENT_AMBULATORY_CARE_PROVIDER_SITE_OTHER): Payer: Self-pay

## 2021-10-14 ENCOUNTER — Telehealth (INDEPENDENT_AMBULATORY_CARE_PROVIDER_SITE_OTHER): Payer: Self-pay | Admitting: Family Medicine

## 2021-10-14 NOTE — Telephone Encounter (Signed)
See pt email from 11/11. Will close encounter.

## 2021-10-14 NOTE — Telephone Encounter (Signed)
Pt is already scheduled for pft 10/27/21 and there are no sooner appts so closing encounter.

## 2021-10-14 NOTE — Telephone Encounter (Signed)
Prior authorization denied for Mounjaro. Patient sent mychart message.  

## 2021-10-14 NOTE — Progress Notes (Signed)
Chief Complaint:   OBESITY Alexandra Beck is here to discuss her progress with her obesity treatment plan along with follow-up of her obesity related diagnoses. Alexandra Beck is on the Category 4 Plan and states she is following her eating plan approximately 60% of the time. Alexandra Beck states she is not currently exercising.  Today's visit was #: 36 Starting weight: 468 lbs Starting date: 04/17/2018 Today's weight: 460 lbs Today's date: 10/13/2021 Total lbs lost to date: 8 Total lbs lost since last in-office visit: 6  Interim History: Alexandra Beck has had SOB with increased symptoms since her last appt. She went to the ED and cardiology as well as pulmonology. She also had an episode of recurrent cellulitis, which she was placed on cefadroxil. Following the plan has been ~60% of the time, given difficulty of moving around due to above. Pt has some emotional eating with all of this going on as well. She thinks she will likely have a small Thanksgiving meal next week.  Subjective:   1. Type 2 diabetes mellitus with hyperglycemia, without long-term current use of insulin (Alexandra Beck) Alexandra Beck couldn't get [facility]. Unfortunately [facility] would not fill the prescription due to voiced issues with diagnosis associated with medication. Her last A1c was slightly better.  2. Dyspnea on exertion Symptoms are slightly improved from ED visit. Pt had a recent increase in diuretics.  Assessment/Plan:   1. Type 2 diabetes mellitus with hyperglycemia, without long-term current use of insulin (Alexandra Beck) Good blood sugar control is important to decrease the likelihood of diabetic complications such as nephropathy, neuropathy, limb loss, blindness, coronary artery disease, and death. Intensive lifestyle modification including diet, exercise and weight loss are the first line of treatment for diabetes.   Refill- tirzepatide (MOUNJARO) 2.5 MG/0.5ML Pen; Inject 2.5 mg into the skin once a week.  Dispense: 2 mL; Refill: 0  2. Dyspnea on  exertion Follow up with pulmonology at next scheduled appt.  3. Class 3 severe obesity with serious comorbidity and body mass index (BMI) greater than or equal to 70 in adult, unspecified obesity type (Alexandra Beck)  Alexandra Beck is currently in the action stage of change. As such, her goal is to continue with weight loss efforts. She has agreed to the Category 4 Plan.   Exercise goals: No exercise has been prescribed at this time.  Behavioral modification strategies: increasing lean protein intake, meal planning and cooking strategies, and keeping healthy foods in the home.  Alexandra Beck has agreed to follow-up with our clinic in 3 weeks. She was informed of the importance of frequent follow-up visits to maximize her success with intensive lifestyle modifications for her multiple health conditions.   Objective:   Blood pressure (!) 150/58, pulse 66, temperature 98.2 F (36.8 C), height 5\' 3"  (1.6 m), weight (!) 460 lb (208.7 kg), SpO2 96 %. Body mass index is 81.49 kg/m.  General: Cooperative, alert, well developed, in no acute distress. HEENT: Conjunctivae and lids unremarkable. Cardiovascular: Regular rhythm.  Lungs: Normal work of breathing. Neurologic: No focal deficits.   Lab Results  Component Value Date   CREATININE 1.12 (H) 09/17/2021   BUN 20 09/17/2021   NA 136 09/17/2021   K 3.7 09/17/2021   CL 96 (L) 09/17/2021   CO2 27 09/17/2021   Lab Results  Component Value Date   ALT 17 09/14/2021   AST 16 09/14/2021   ALKPHOS 89 09/14/2021   BILITOT 0.2 09/14/2021   Lab Results  Component Value Date   HGBA1C 5.9 (H) 11/11/2020  HGBA1C 5.6 12/17/2019   HGBA1C 5.6 07/04/2019   HGBA1C 5.4 12/04/2018   HGBA1C 5.8 (H) 07/25/2018   Lab Results  Component Value Date   INSULIN 23.6 11/11/2020   INSULIN 17.1 12/17/2019   INSULIN 23.5 07/04/2019   INSULIN 25.8 (H) 12/04/2018   INSULIN 24.7 07/25/2018   Lab Results  Component Value Date   TSH 2.94 01/08/2021   Lab Results  Component  Value Date   CHOL 185 11/11/2020   HDL 53 11/11/2020   LDLCALC 103 (H) 11/11/2020   LDLDIRECT 141.0 03/02/2016   TRIG 169 (H) 11/11/2020   CHOLHDL 4 03/23/2018   Lab Results  Component Value Date   VD25OH 30.2 11/11/2020   VD25OH 38.2 12/17/2019   VD25OH 34.6 07/04/2019   Lab Results  Component Value Date   WBC 7.4 09/17/2021   HGB 11.3 (L) 09/17/2021   HCT 38.5 09/17/2021   MCV 89.1 09/17/2021   PLT 266 09/17/2021    Attestation Statements:   Reviewed by clinician on day of visit: allergies, medications, problem list, medical history, surgical history, family history, social history, and previous encounter notes.  Coral Ceo, CMA, am acting as transcriptionist for Coralie Common, MD.   I have reviewed the above documentation for accuracy and completeness, and I agree with the above. - Coralie Common, MD

## 2021-10-15 ENCOUNTER — Other Ambulatory Visit (HOSPITAL_COMMUNITY): Payer: Self-pay

## 2021-10-20 ENCOUNTER — Ambulatory Visit (INDEPENDENT_AMBULATORY_CARE_PROVIDER_SITE_OTHER): Payer: BC Managed Care – PPO | Admitting: Psychology

## 2021-10-20 DIAGNOSIS — F331 Major depressive disorder, recurrent, moderate: Secondary | ICD-10-CM

## 2021-10-27 ENCOUNTER — Ambulatory Visit: Payer: BC Managed Care – PPO | Admitting: Psychology

## 2021-10-27 ENCOUNTER — Other Ambulatory Visit: Payer: Self-pay

## 2021-10-27 ENCOUNTER — Ambulatory Visit: Payer: BC Managed Care – PPO | Admitting: Pulmonary Disease

## 2021-10-27 ENCOUNTER — Encounter: Payer: Self-pay | Admitting: Pulmonary Disease

## 2021-10-27 ENCOUNTER — Ambulatory Visit (INDEPENDENT_AMBULATORY_CARE_PROVIDER_SITE_OTHER): Payer: BC Managed Care – PPO | Admitting: Pulmonary Disease

## 2021-10-27 VITALS — BP 130/86 | HR 67 | Temp 98.7°F | Ht 63.0 in | Wt >= 6400 oz

## 2021-10-27 DIAGNOSIS — R0602 Shortness of breath: Secondary | ICD-10-CM

## 2021-10-27 DIAGNOSIS — G47419 Narcolepsy without cataplexy: Secondary | ICD-10-CM | POA: Diagnosis not present

## 2021-10-27 DIAGNOSIS — Z23 Encounter for immunization: Secondary | ICD-10-CM | POA: Diagnosis not present

## 2021-10-27 DIAGNOSIS — G4733 Obstructive sleep apnea (adult) (pediatric): Secondary | ICD-10-CM | POA: Diagnosis not present

## 2021-10-27 DIAGNOSIS — I5032 Chronic diastolic (congestive) heart failure: Secondary | ICD-10-CM

## 2021-10-27 LAB — PULMONARY FUNCTION TEST
DL/VA % pred: 118 %
DL/VA: 5.3 ml/min/mmHg/L
DLCO cor % pred: 94 %
DLCO cor: 19.27 ml/min/mmHg
DLCO unc % pred: 94 %
DLCO unc: 19.27 ml/min/mmHg
FEF 25-75 Post: 3.87 L/sec
FEF 25-75 Pre: 3.06 L/sec
FEF2575-%Change-Post: 26 %
FEF2575-%Pred-Post: 129 %
FEF2575-%Pred-Pre: 102 %
FEV1-%Change-Post: 6 %
FEV1-%Pred-Post: 71 %
FEV1-%Pred-Pre: 67 %
FEV1-Post: 2.01 L
FEV1-Pre: 1.89 L
FEV1FVC-%Change-Post: 2 %
FEV1FVC-%Pred-Pre: 106 %
FEV6-%Change-Post: 3 %
FEV6-%Pred-Post: 66 %
FEV6-%Pred-Pre: 64 %
FEV6-Post: 2.25 L
FEV6-Pre: 2.17 L
FEV6FVC-%Pred-Post: 101 %
FEV6FVC-%Pred-Pre: 101 %
FVC-%Change-Post: 3 %
FVC-%Pred-Post: 65 %
FVC-%Pred-Pre: 63 %
FVC-Post: 2.25 L
FVC-Pre: 2.17 L
Post FEV1/FVC ratio: 89 %
Post FEV6/FVC ratio: 100 %
Pre FEV1/FVC ratio: 87 %
Pre FEV6/FVC Ratio: 100 %
RV % pred: 94 %
RV: 1.42 L
TLC % pred: 81 %
TLC: 3.88 L

## 2021-10-27 NOTE — Progress Notes (Signed)
   Subjective:    Patient ID: Alexandra Beck, female    DOB: 08/30/1981, 40 y.o.   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  She takes 80 mg of Lasix daily  Last office visit 08/2020 was reviewed.  She developed increased shortness of breath, BNP was high, D-dimer was slightly elevated. She went to the ED, CT angiogram was negative for pulmonary embolism, echo showed diastolic dysfunction, LVEF was normal. She increase her torsemide, weight dropped from 467 to 455 pounds and breathing is improved. Reviewed PFTs today  She is compliant with her CPAP machine, getting an error message on the machine and would like a replacement CPAP.  No problems with mask or pressure.  We have received request for refills on armodafinil and this was sent, prior authorization was done until October 2023 She is on 300 mg which she takes every day in the mornings prior to going to work at noon.  For some reason she only received 30 tablets of 100 mg modafinil and 60 tablets of 200 mg  Significant tests/ events reviewed  PFTs 09/2021 moderate restriction, no airway obstruction  NPSG 2009:  AHI 93/hr On auto 5-20cm    ABG  06/2014 showing mild hypercarbia 7.35/50 1/90 5/97%    CPAP titration 05/2018 >> CPAP 15 + 3L O2 (434 lbs )   MSLT >> SOREMs x3    Past Medical History:  Diagnosis Date   Acute renal failure (ARF) ([place]) 11/2012    multifactorial-likely secondary to ATN in the setting of sepsis and hypotension, and also from rhabdomyolysis   Anemia    Cellulitis    Chronic diastolic CHF (congestive heart failure) ([place])    Depression    Diabetes mellitus (HCC)    GERD (gastroesophageal reflux disease)    Hyperlipidemia    Hypertension    Hypothyroidism    Morbid obesity with BMI of 70 and over, adult  ([place])    Obesity hypoventilation syndrome (HCC)    OSA (obstructive sleep apnea)    PVC's (premature ventricular contractions)    Recurrent cellulitis of lower leg    LLE, venous insuff   Sepsis ([place]) 11/2012   Secondary to cellulitis   Sleep apnea    Venous insufficiency of leg      Review of Systems  neg for any significant sore throat, dysphagia, itching, sneezing, nasal congestion or excess/ purulent secretions, fever, chills, sweats, unintended wt loss, pleuritic or exertional cp, hempoptysis, orthopnea pnd or change in chronic leg swelling. Also denies presyncope, palpitations, heartburn, abdominal pain, nausea, vomiting, diarrhea or change in bowel or urinary habits, dysuria,hematuria, rash, arthralgias, visual complaints, headache, numbness weakness or ataxia.     Objective:   Physical Exam  Gen. Pleasant, obese, in no distress ENT - no lesions, no post nasal drip Neck: No JVD, no thyromegaly, no carotid bruits Lungs: no use of accessory muscles, no dullness to percussion, decreased without rales or rhonchi  Cardiovascular: Rhythm regular, heart sounds  normal, no murmurs or gallops, no peripheral edema Musculoskeletal: No deformities, no cyanosis or clubbing , no tremors       Assessment & Plan:

## 2021-10-27 NOTE — Assessment & Plan Note (Signed)
No download is available today. We will send in a prescription for replacement auto CPAP 12 to 20 cm  CPAP supplies will also be renewed, she has been unable to get in touch with her DME [facility]  Weight loss encouraged, compliance with goal of at least 4-6 hrs every night is the expectation. Advised against medications with sedative side effects Cautioned against driving when sleepy - understanding that sleepiness will vary on a day to day basis

## 2021-10-27 NOTE — Patient Instructions (Signed)
X  Rx for replacement CPAP auto 10-20 to [facility]  Modafinil has been pre-approved until oct 2023

## 2021-10-27 NOTE — Assessment & Plan Note (Signed)
Refills provided on modafinil, continue 300 mg daily.  She takes this as 2 separate tablets, 200+100 mg  We discussed the importance of exercise and sleep schedule

## 2021-10-27 NOTE — Progress Notes (Signed)
PFT done today. 

## 2021-10-27 NOTE — Assessment & Plan Note (Signed)
Shortness of breath seems to have been related to fluid retention and weight gain. She has lost 12 pounds to her current weight of 455 pounds and breathing is improved. PFTs were reviewed and she does not have significant airway obstruction, does not need long-acting bronchodilators. She will continue to use albuterol on a as needed basis for wheezing I have asked her to keep a weight log is much as possible and aim for a baseline weight of 455 pounds

## 2021-11-02 ENCOUNTER — Encounter (INDEPENDENT_AMBULATORY_CARE_PROVIDER_SITE_OTHER): Payer: Self-pay | Admitting: Family Medicine

## 2021-11-03 ENCOUNTER — Ambulatory Visit (INDEPENDENT_AMBULATORY_CARE_PROVIDER_SITE_OTHER): Payer: BC Managed Care – PPO | Admitting: Family Medicine

## 2021-11-03 ENCOUNTER — Ambulatory Visit (INDEPENDENT_AMBULATORY_CARE_PROVIDER_SITE_OTHER): Payer: BC Managed Care – PPO | Admitting: Psychology

## 2021-11-03 DIAGNOSIS — F331 Major depressive disorder, recurrent, moderate: Secondary | ICD-10-CM | POA: Diagnosis not present

## 2021-11-06 ENCOUNTER — Encounter: Payer: Self-pay | Admitting: Interventional Cardiology

## 2021-11-06 ENCOUNTER — Other Ambulatory Visit (INDEPENDENT_AMBULATORY_CARE_PROVIDER_SITE_OTHER): Payer: Self-pay | Admitting: Family Medicine

## 2021-11-06 DIAGNOSIS — I5032 Chronic diastolic (congestive) heart failure: Secondary | ICD-10-CM

## 2021-11-08 MED ORDER — TORSEMIDE 20 MG PO TABS: ORAL_TABLET | ORAL | 2 refills | Status: DC

## 2021-11-08 NOTE — Telephone Encounter (Signed)
Hold-pt sent message to Cardiologist for refill-CAS

## 2021-11-08 NOTE — Telephone Encounter (Signed)
OK to refill for torsemide 60 mg in AM, 40 mg in PM PO prn  Disp 90 day supply.   BMet in 1-2 weeks.   JV

## 2021-11-10 ENCOUNTER — Other Ambulatory Visit (HOSPITAL_COMMUNITY): Payer: Self-pay

## 2021-11-10 ENCOUNTER — Ambulatory Visit (INDEPENDENT_AMBULATORY_CARE_PROVIDER_SITE_OTHER): Payer: BC Managed Care – PPO | Admitting: Family Medicine

## 2021-11-10 ENCOUNTER — Other Ambulatory Visit: Payer: Self-pay

## 2021-11-10 ENCOUNTER — Encounter (INDEPENDENT_AMBULATORY_CARE_PROVIDER_SITE_OTHER): Payer: Self-pay | Admitting: Family Medicine

## 2021-11-10 ENCOUNTER — Ambulatory Visit (INDEPENDENT_AMBULATORY_CARE_PROVIDER_SITE_OTHER): Payer: BC Managed Care – PPO | Admitting: Psychology

## 2021-11-10 VITALS — BP 116/68 | HR 65 | Temp 98.2°F | Ht 63.0 in | Wt >= 6400 oz

## 2021-11-10 DIAGNOSIS — F331 Major depressive disorder, recurrent, moderate: Secondary | ICD-10-CM

## 2021-11-10 DIAGNOSIS — Z6841 Body Mass Index (BMI) 40.0 and over, adult: Secondary | ICD-10-CM

## 2021-11-10 DIAGNOSIS — E1165 Type 2 diabetes mellitus with hyperglycemia: Secondary | ICD-10-CM

## 2021-11-10 DIAGNOSIS — I5032 Chronic diastolic (congestive) heart failure: Secondary | ICD-10-CM | POA: Diagnosis not present

## 2021-11-10 MED ORDER — TIRZEPATIDE 5 MG/0.5ML ~~LOC~~ SOAJ
5.0000 mg | SUBCUTANEOUS | 0 refills | Status: DC
  Filled 2021-11-10: qty 2, 28d supply, fill #0
  Filled 2021-12-17: qty 2, 28d supply, fill #1
  Filled 2022-01-18: qty 2, 28d supply, fill #2

## 2021-11-10 NOTE — Progress Notes (Signed)
Chief Complaint:   OBESITY Alexandra Beck is here to discuss her progress with her obesity treatment plan along with follow-up of her obesity related diagnoses. Alexandra Beck is on the Category 4 Plan and states she is following her eating plan approximately 75% of the time. Alexandra Beck states she is not currently exercising.  Today's visit was #: 51 Starting weight: 468 lbs Starting date: 04/17/2018 Today's weight: 458 lbs Today's date: 11/10/2021 Total lbs lost to date: 10 Total lbs lost since last in-office visit: 2  Interim History: Alexandra Beck went to her mom's for Thanksgiving (her mom got Alexandra Beck). She is going to her wife's sister's house to celebrate the holidays this weekend. She is celebrating the holiday with her wife at home. Pt is doing well on meal plan and focusing on getting good amount of protein in at each meal. She is experiencing some drive to eat out of boredom at work.  Subjective:   1. Type 2 diabetes mellitus with hyperglycemia, without long-term current use of insulin (HCC) Pt is doing well on Mounjaro 2.5 mg with no GI side effects. She is not noticing much satiety.  2. Chronic diastolic CHF (congestive heart failure) (HCC) Alexandra Beck sees cardiology. She is still taking 5 tablets of torsemide a day. She got labs done to check kidney function.  Assessment/Plan:   1. Type 2 diabetes mellitus with hyperglycemia, without long-term current use of insulin (HCC) Good blood sugar control is important to decrease the likelihood of diabetic complications such as nephropathy, neuropathy, limb loss, blindness, coronary artery disease, and death. Intensive lifestyle modification including diet, exercise and weight loss are the first line of treatment for diabetes.  Increase Mounjaro to 5 mg as directed.  Increase and Refill- tirzepatide (MOUNJARO) 5 MG/0.5ML Pen; Inject 5 mg into the skin once a week.  Dispense: 6 mL; Refill: 0  2. Chronic diastolic CHF (congestive heart failure)  ([place]) Follow up with cardiology at next scheduled appt. No change in dose of diuretics.  3. Obesity with current BMI 81.2  Alexandra Beck is currently in the action stage of change. As such, her goal is to continue with weight loss efforts. She has agreed to the Category 4 Plan.   Exercise goals:  As is  Behavioral modification strategies: increasing lean protein intake, meal planning and cooking strategies, keeping healthy foods in the home, emotional eating strategies, and planning for success.  Alexandra Beck has agreed to follow-up with our clinic in 4 weeks. She was informed of the importance of frequent follow-up visits to maximize her success with intensive lifestyle modifications for her multiple health conditions.   Objective:   Blood pressure 116/68, pulse 65, temperature 98.2 F (36.8 C), height 5\' 3"  (1.6 m), weight (!) 458 lb (207.7 kg), SpO2 95 %. Body mass index is 81.13 kg/m.  General: Cooperative, alert, well developed, in no acute distress. HEENT: Conjunctivae and lids unremarkable. Cardiovascular: Regular rhythm.  Lungs: Normal work of breathing. Neurologic: No focal deficits.   Lab Results  Component Value Date   CREATININE 1.12 (H) 09/17/2021   BUN 20 09/17/2021   NA 136 09/17/2021   K 3.7 09/17/2021   CL 96 (L) 09/17/2021   CO2 27 09/17/2021   Lab Results  Component Value Date   ALT 17 09/14/2021   AST 16 09/14/2021   ALKPHOS 89 09/14/2021   BILITOT 0.2 09/14/2021   Lab Results  Component Value Date   HGBA1C 5.9 (H) 11/11/2020   HGBA1C 5.6 12/17/2019   HGBA1C 5.6  07/04/2019   HGBA1C 5.4 12/04/2018   HGBA1C 5.8 (H) 07/25/2018   Lab Results  Component Value Date   INSULIN 23.6 11/11/2020   INSULIN 17.1 12/17/2019   INSULIN 23.5 07/04/2019   INSULIN 25.8 (H) 12/04/2018   INSULIN 24.7 07/25/2018   Lab Results  Component Value Date   TSH 2.94 01/08/2021   Lab Results  Component Value Date   CHOL 185 11/11/2020   HDL 53 11/11/2020   LDLCALC 103 (H)  11/11/2020   LDLDIRECT 141.0 03/02/2016   TRIG 169 (H) 11/11/2020   CHOLHDL 4 03/23/2018   Lab Results  Component Value Date   VD25OH 30.2 11/11/2020   VD25OH 38.2 12/17/2019   VD25OH 34.6 07/04/2019   Lab Results  Component Value Date   WBC 7.4 09/17/2021   HGB 11.3 (L) 09/17/2021   HCT 38.5 09/17/2021   MCV 89.1 09/17/2021   PLT 266 09/17/2021    Attestation Statements:   Reviewed by clinician on day of visit: allergies, medications, problem list, medical history, surgical history, family history, social history, and previous encounter notes.  Coral Ceo, CMA, am acting as transcriptionist for Coralie Common, MD.   I have reviewed the above documentation for accuracy and completeness, and I agree with the above. - Coralie Common, MD

## 2021-11-10 NOTE — Progress Notes (Signed)
[facility] Counselor/Therapist Progress Note  Patient ID: Alexandra Beck, MRN: [id number],    Date: 11/10/2021  Time Spent: 60 minutes  Treatment Type: Individual Therapy  Reported Symptoms: sadness,   Mental Status Exam: Appearance:  Casual     Behavior: Appropriate  Motor: Normal  Speech/Language:  Normal Rate  Affect: Appropriate  Mood: depressed  Thought process: normal  Thought content:   WNL  Sensory/Perceptual disturbances:   WNL  Orientation: oriented to person, place, time/date, and situation  Attention: Good  Concentration: Good  Memory: WNL  Fund of knowledge:  Good  Insight:   Good  Judgment:  Good  Impulse Control: Good   Risk Assessment: Danger to Self:  No Self-injurious Behavior: No Danger to Others: No Duty to Warn:no Physical Aggression / Violence:No  Access to Firearms a concern: No  Gang Involvement:No   Subjective: The patient attended a face-to-face individual therapy session today via video visit due to COVID-19.  The patient gave verbal consent for the video visit to be on WebEx.  The patient was in her home alone and the therapist was in the office.  The patient presents with a blunted affect and mood is depressed.  The patient talked about a situation that she had with her wife this week.  We processed the situation and it appears that her wife is jealous of her relationship with an online friend.  I explained to the patient that it is possible that her wife has some reaction to that because of fear of abandonment herself.  I encouraged the patient to have a conversation and continue to have conversation with her wife about communication issues.  We will explore the possibility of marital counseling for the patient and her wife in the new year.  Interventions: Cognitive Behavioral Therapy, Assertiveness/Communication, and Problem solving  Diagnosis:Major depressive disorder, recurrent episode, moderate (HCC)  Plan: Treatment  Plan  Strengths/Abilities:  Intelligent, ability for insight, supportive wife  Treatment Preferences:  Outpatient Individual therapy  Statement of Needs:  I Need help with my depression and motivation.   Symptoms:Depressed or irritable mood.:(Status: improved). Diminished interest in or  enjoyment of activities.(Status: improved). Feelings of hopelessness,  worthlessness, or inappropriate guilt.: (Status: improved). History of chronic or recurrent depression for which the client has taken antidepressant medication, been hospitalized, had  outpatient treatment, or had a course of electroconvulsive therapy.(Status:  maintained). Lack of energy.: (Status: maintained). Low self-esteem.:  (Status: maintained). Poor concentration and indecisiveness.:  (Status: improved). Social withdrawal.:  (Status: maintained).  Unresolved grief issues.: (Status: maintained).  Problems Addressed:  Unipolar Depression  Goals:  LTG:1. Alleviate depressive symptoms and return to previous level of effective  functioning. 2. Appropriately grieve the loss in order to normalize mood and to return  to previously adaptive level of functioning. 3. Develop healthy interpersonal relationships that lead to the alleviation  and help prevent the relapse of depression. 4. Develop healthy thinking patterns and beliefs about self, others, and the world that lead to the alleviation and help prevent the relapse of  depression STG:1.Describe current and past experiences with depression including their impact on functioning and  attempts to resolve it. 2. Identify and replace thoughts and beliefs that support depression. 3.Learn and implement behavioral strategies to overcome depression.  4.Verbalize an understanding of healthy and unhealthy emotions with the intent of increasing the use of  healthy emotions to guide actions.  Target Date:  10/07/2022 Frequency:  Weekly Modality:Individual Interventions by Therapist:   CBT, problem solving,  EMDR, insight oriented  Aria Jarrard G Keldan Eplin, LCSW

## 2021-11-11 ENCOUNTER — Other Ambulatory Visit (HOSPITAL_COMMUNITY): Payer: Self-pay

## 2021-11-12 NOTE — Telephone Encounter (Signed)
I placed call to patient to schedule lab work and April follow up.  Left message to call office

## 2021-11-17 ENCOUNTER — Ambulatory Visit (INDEPENDENT_AMBULATORY_CARE_PROVIDER_SITE_OTHER): Payer: BC Managed Care – PPO | Admitting: Psychology

## 2021-11-17 DIAGNOSIS — F331 Major depressive disorder, recurrent, moderate: Secondary | ICD-10-CM | POA: Diagnosis not present

## 2021-11-17 NOTE — Progress Notes (Signed)
[facility] Counselor/Therapist Progress Note  Patient ID: Alexandra Beck, MRN: [id number],    Date: 11/17/2021  Time Spent: 60 minutes  Treatment Type: Individual Therapy  Reported Symptoms: sadness,   Mental Status Exam: Appearance:  Casual     Behavior: Appropriate  Motor: Normal  Speech/Language:  Normal Rate  Affect: Appropriate  Mood: depressed  Thought process: normal  Thought content:   WNL  Sensory/Perceptual disturbances:   WNL  Orientation: oriented to person, place, time/date, and situation  Attention: Good  Concentration: Good  Memory: WNL  Fund of knowledge:  Good  Insight:   Good  Judgment:  Good  Impulse Control: Good   Risk Assessment: Danger to Self:  No Self-injurious Behavior: No Danger to Others: No Duty to Warn:no Physical Aggression / Violence:No  Access to Firearms a concern: No  Gang Involvement:No   Subjective: The patient attended a face-to-face individual therapy session today via video visit due to COVID-19.  The patient gave verbal consent for the video visit to be on WebEx.  The patient was in her home alone and the therapist was in the office.  The patient presents with a blunted affect and mood is depressed.  The patient talked about checking into getting a transfer tub bench and she feels that it would be too obstructive to their bathroom.  We discussed the possibility of her talking to her medical Doctor and seeing if they could send out home health to do a home evaluation and make recommendations of something that might be helpful for her to use in the shower.  She also talked about her wife having another reactive outburst that seemed out of place this week.  Recommended that she have a conversation with her wife about having a discussion with her therapist about her reactivity and see if there is something that can help her with this as it will cause issues in their relationship.  Patient doing much better with taking better  care of herself and standing up for herself in situations.  Interventions: Cognitive Behavioral Therapy, Assertiveness/Communication, and Problem solving  Diagnosis:Major depressive disorder, recurrent episode, moderate (HCC)  Plan: Treatment Plan  Strengths/Abilities:  Intelligent, ability for insight, supportive wife  Treatment Preferences:  Outpatient Individual therapy  Statement of Needs:  "I Need help with my depression and motivation. "  Symptoms:Depressed or irritable mood.:(Status: improved). Diminished interest in or  enjoyment of activities.(Status: improved). Feelings of hopelessness,  worthlessness, or inappropriate guilt.: (Status: improved). History of chronic or recurrent depression for which the client has taken antidepressant medication, been hospitalized, had  outpatient treatment, or had a course of electroconvulsive therapy.(Status:  maintained). Lack of energy.: (Status: maintained). Low self-esteem.:  (Status: maintained). Poor concentration and indecisiveness.:  (Status: improved). Social withdrawal.:  (Status: maintained).  Unresolved grief issues.: (Status: maintained).  Problems Addressed:  Unipolar Depression  Goals:  LTG:1. Alleviate depressive symptoms and return to previous level of effective  functioning.  20% 2. Appropriately grieve the loss in order to normalize mood and to return  to previously adaptive level of functioning. 3. Develop healthy interpersonal relationships that lead to the alleviation  and help prevent the relapse of depression.  20% 4. Develop healthy thinking patterns and beliefs about self, others, and the world that lead to the alleviation and help prevent the relapse of  Depression  20% STG:1.Describe current and past experiences with depression including their impact on functioning and  attempts to resolve it.  50 % 2. Identify and replace thoughts  and beliefs that support depression.  20 % 3.Learn and implement behavioral  strategies to overcome depression. 20% 4.Verbalize an understanding of healthy and unhealthy emotions with the intent of increasing the use of  healthy emotions to guide actions.  20 %  Target Date:  10/07/2022 Frequency:  Weekly Modality:Individual Interventions by Therapist:  CBT, problem solving, EMDR, insight oriented  Patient approved Treatment Plan  Alexandra Tome Rejeana Brock, LCSW

## 2021-11-17 NOTE — Telephone Encounter (Signed)
Left message to call office

## 2021-11-24 ENCOUNTER — Other Ambulatory Visit (INDEPENDENT_AMBULATORY_CARE_PROVIDER_SITE_OTHER): Payer: Self-pay | Admitting: Family Medicine

## 2021-11-24 ENCOUNTER — Ambulatory Visit: Payer: BC Managed Care – PPO | Admitting: Psychology

## 2021-11-24 DIAGNOSIS — I5032 Chronic diastolic (congestive) heart failure: Secondary | ICD-10-CM

## 2021-11-25 ENCOUNTER — Encounter (INDEPENDENT_AMBULATORY_CARE_PROVIDER_SITE_OTHER): Payer: Self-pay

## 2021-11-25 NOTE — Telephone Encounter (Signed)
Msg sent to pt 

## 2021-11-30 NOTE — Telephone Encounter (Signed)
Dr.Ukleja 

## 2021-12-01 ENCOUNTER — Ambulatory Visit (INDEPENDENT_AMBULATORY_CARE_PROVIDER_SITE_OTHER): Payer: BC Managed Care – PPO | Admitting: Psychology

## 2021-12-01 DIAGNOSIS — F331 Major depressive disorder, recurrent, moderate: Secondary | ICD-10-CM

## 2021-12-01 NOTE — Progress Notes (Signed)
[facility] Counselor/Therapist Progress Note  Patient ID: Alexandra Beck, MRN: [id number],    Date: 12/01/2021  Time Spent: 60 minutes  Treatment Type: Individual Therapy  Reported Symptoms: sadness,   Mental Status Exam: Appearance:  Casual     Behavior: Appropriate  Motor: Normal  Speech/Language:  Normal Rate  Affect: Appropriate  Mood: depressed  Thought process: normal  Thought content:   WNL  Sensory/Perceptual disturbances:   WNL  Orientation: oriented to person, place, time/date, and situation  Attention: Good  Concentration: Good  Memory: WNL  Fund of knowledge:  Good  Insight:   Good  Judgment:  Good  Impulse Control: Good   Risk Assessment: Danger to Self:  No Self-injurious Behavior: No Danger to Others: No Duty to Warn:no Physical Aggression / Violence:No  Access to Firearms a concern: No  Gang Involvement:No   Subjective: The patient attended a face-to-face individual therapy session today via video visit due to COVID-19.  The patient gave verbal consent for the video visit to be on WebEx.  The patient was in her home alone and the therapist was in the office.  The patient presents with a blunted affect and mood is depressed.  The patient reports that things have been relatively calm the last few days with her wife.  She states that they did have a couple of altercations about [name].  I asked the patient whether she felt like she was doing anything that could be perceived by her spouse as behavior of an emotional affair.  The patient had difficulty coming up with an answer however she did say that she and [name] texted each other daily in the morning and also in the evening.  I explained to the patient that that could be perceived as dating behavior.  We talked about the possibility that she is giving her spouse messages about being involved with [name] and not being aware that she is providing intimacy with [name].  I explained to her that she is going to have  to think about how she might want to modify her behavior if she wants to continue to stay in her relationship in a healthy way.  We talked about holding off until next week and thus coming up with a plan for her to be able to distance herself a little from [place] and maintain a friendship.  The patient seems sad about this however she is aware that her behavior is indicative of an emotional affair.  Interventions: Cognitive Behavioral Therapy, Assertiveness/Communication, and Problem solving  Diagnosis:Major depressive disorder, recurrent episode, moderate (HCC)  Plan: Treatment Plan  Strengths/Abilities:  Intelligent, ability for insight, supportive wife  Treatment Preferences:  Outpatient Individual therapy  Statement of Needs:  "I Need help with my depression and motivation. "  Symptoms:Depressed or irritable mood.:(Status: improved). Diminished interest in or  enjoyment of activities.(Status: improved). Feelings of hopelessness,  worthlessness, or inappropriate guilt.: (Status: improved). History of chronic or recurrent depression for which the client has taken antidepressant medication, been hospitalized, had  outpatient treatment, or had a course of electroconvulsive therapy.(Status:  maintained). Lack of energy.: (Status: maintained). Low self-esteem.:  (Status: maintained). Poor concentration and indecisiveness.:  (Status: improved). Social withdrawal.:  (Status: maintained).  Unresolved grief issues.: (Status: maintained).  Problems Addressed:  Unipolar Depression  Goals:  LTG:1. Alleviate depressive symptoms and return to previous level of effective  functioning.  30% 2. Appropriately grieve the loss in order to normalize mood and to return  to previously adaptive level of  functioning. 3. Develop healthy interpersonal relationships that lead to the alleviation  and help prevent the relapse of depression.  30% 4. Develop healthy thinking patterns and beliefs about self, others,  and the world that lead to the alleviation and help prevent the relapse of  Depression  30% STG:1.Describe current and past experiences with depression including their impact on functioning and  attempts to resolve it.  50 % 2. Identify and replace thoughts and beliefs that support depression.  20 % 3.Learn and implement behavioral strategies to overcome depression. 20% 4.Verbalize an understanding of healthy and unhealthy emotions with the intent of increasing the use of  healthy emotions to guide actions.  20 %  Target Date:  10/07/2022 Frequency:  Weekly Modality:Individual Interventions by Therapist:  CBT, problem solving, EMDR, insight oriented  Patient approved Treatment Plan  Naol Ontiveros Rejeana Brock, LCSW

## 2021-12-08 ENCOUNTER — Ambulatory Visit (INDEPENDENT_AMBULATORY_CARE_PROVIDER_SITE_OTHER): Payer: BC Managed Care – PPO | Admitting: Psychology

## 2021-12-08 ENCOUNTER — Ambulatory Visit (INDEPENDENT_AMBULATORY_CARE_PROVIDER_SITE_OTHER): Payer: BC Managed Care – PPO | Admitting: Family Medicine

## 2021-12-08 ENCOUNTER — Other Ambulatory Visit (HOSPITAL_COMMUNITY): Payer: Self-pay

## 2021-12-08 ENCOUNTER — Encounter (INDEPENDENT_AMBULATORY_CARE_PROVIDER_SITE_OTHER): Payer: Self-pay | Admitting: Family Medicine

## 2021-12-08 ENCOUNTER — Other Ambulatory Visit: Payer: Self-pay

## 2021-12-08 VITALS — BP 112/60 | HR 69 | Temp 97.9°F | Ht 63.0 in | Wt >= 6400 oz

## 2021-12-08 DIAGNOSIS — I5032 Chronic diastolic (congestive) heart failure: Secondary | ICD-10-CM

## 2021-12-08 DIAGNOSIS — Z6841 Body Mass Index (BMI) 40.0 and over, adult: Secondary | ICD-10-CM

## 2021-12-08 DIAGNOSIS — E669 Obesity, unspecified: Secondary | ICD-10-CM

## 2021-12-08 DIAGNOSIS — F331 Major depressive disorder, recurrent, moderate: Secondary | ICD-10-CM

## 2021-12-08 DIAGNOSIS — E1165 Type 2 diabetes mellitus with hyperglycemia: Secondary | ICD-10-CM | POA: Diagnosis not present

## 2021-12-08 MED ORDER — SPIRONOLACTONE 25 MG PO TABS
25.0000 mg | ORAL_TABLET | Freq: Every day | ORAL | 1 refills | Status: DC
  Filled 2021-12-08: qty 90, 90d supply, fill #0

## 2021-12-08 NOTE — Progress Notes (Signed)
[facility] Counselor/Therapist Progress Note  Patient ID: Alexandra Beck, MRN: [id number],    Date: 12/08/2021  Time Spent: 60 minutes  Treatment Type: Individual Therapy  Reported Symptoms: sadness,   Mental Status Exam: Appearance:  Casual     Behavior: Appropriate  Motor: Normal  Speech/Language:  Normal Rate  Affect: Appropriate  Mood: depressed  Thought process: normal  Thought content:   WNL  Sensory/Perceptual disturbances:   WNL  Orientation: oriented to person, place, time/date, and situation  Attention: Good  Concentration: Good  Memory: WNL  Fund of knowledge:  Good  Insight:   Good  Judgment:  Good  Impulse Control: Good   Risk Assessment: Danger to Self:  No Self-injurious Behavior: No Danger to Others: No Duty to Warn:no Physical Aggression / Violence:No  Access to Firearms a concern: No  Gang Involvement:No   Subjective: The patient attended a face-to-face individual therapy session today via video visit due to COVID-19.  The patient gave verbal consent for the video visit to be on WebEx.  The patient was in her home alone and the therapist was in the office.  The patient presents with a blunted affect and mood is depressed.  The patient reports that she is feeling melancholy today.  The patient talked about how she plans to change some of her behavior in regard to what we talked about last week.  She seems to be grieving somewhat because she enjoyed the relationship that she was having with this gamer person.  As we talked about last week it mimicked an emotional affair.  We talked about her just saying good morning as opposed to saying good night and good morning.  We talked about the intimacy of the saying good night and apparently she gained some insight into how it could be harmful to her partner by her texting someone at night when they are going to bed.  The patient is aware that she is grieving the change in this other  relationship.  Interventions: Cognitive Behavioral Therapy, Assertiveness/Communication, and Problem solving  Diagnosis:Major depressive disorder, recurrent episode, moderate (HCC)  Plan: Treatment Plan  Strengths/Abilities:  Intelligent, ability for insight, supportive wife  Treatment Preferences:  Outpatient Individual therapy  Statement of Needs:  "I Need help with my depression and motivation. "  Symptoms:Depressed or irritable mood.:(Status: improved). Diminished interest in or  enjoyment of activities.(Status: improved). Feelings of hopelessness,  worthlessness, or inappropriate guilt.: (Status: improved). History of chronic or recurrent depression for which the client has taken antidepressant medication, been hospitalized, had  outpatient treatment, or had a course of electroconvulsive therapy.(Status:  maintained). Lack of energy.: (Status: maintained). Low self-esteem.:  (Status: maintained). Poor concentration and indecisiveness.:  (Status: improved). Social withdrawal.:  (Status: maintained).  Unresolved grief issues.: (Status: maintained).  Problems Addressed:  Unipolar Depression  Goals:  LTG:1. Alleviate depressive symptoms and return to previous level of effective  functioning.  30% 2. Appropriately grieve the loss in order to normalize mood and to return  to previously adaptive level of functioning. 3. Develop healthy interpersonal relationships that lead to the alleviation  and help prevent the relapse of depression.  30% 4. Develop healthy thinking patterns and beliefs about self, others, and the world that lead to the alleviation and help prevent the relapse of  Depression  30% STG:1.Describe current and past experiences with depression including their impact on functioning and  attempts to resolve it.  50 % 2. Identify and replace thoughts and beliefs that support depression.  20 % 3.Learn and implement behavioral strategies to overcome depression.  20% 4.Verbalize an understanding of healthy and unhealthy emotions with the intent of increasing the use of  healthy emotions to guide actions.  20 %  Target Date:  10/07/2022 Frequency:  Weekly Modality:Individual Interventions by Therapist:  CBT, problem solving, EMDR, insight oriented  Patient approved Treatment Plan  Zygmunt Mcglinn Rejeana Brock, LCSW

## 2021-12-09 NOTE — Progress Notes (Signed)
Chief Complaint:   OBESITY Alexandra Beck is here to discuss her progress with her obesity treatment plan along with follow-up of her obesity related diagnoses. Alexandra Beck is on the Category 4 Plan and states she is following her eating plan approximately 60% of the time. Alexandra Beck states she is not currently exercising.  Today's visit was #: 25 Starting weight: 468 lbs Starting date: 04/17/2018 Today's weight: 455 lbs Today's date: 12/08/2021 Total lbs lost to date: 13 Total lbs lost since last in-office visit: 3  Interim History: Pt had a quiet holiday- mom came over, saw brother and his family. She went to bed early for New Years. 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 She has been in routine of bringing lunch to work. Breakfast has been the hardest meal of the day. Her life schedule is still in flux.  Subjective:   1. Chronic diastolic CHF (congestive heart failure) (HCC) Alexandra Beck is on combo meds and sees cardiology. She denies dyspnea on exertion.  2. Type 2 diabetes mellitus with hyperglycemia, without long-term current use of insulin (HCC) Pt is on 5 mg Mounjaro with good hunger awareness and indulgence control. She has 1 more shot in box.  Assessment/Plan:   1. Chronic diastolic CHF (congestive heart failure) (HCC) Continue current treatment plan.  Refill- spironolactone (ALDACTONE) 25 MG tablet; Take 1 tablet (25 mg total) by mouth daily.  Dispense: 90 tablet; Refill: 1  2. Type 2 diabetes mellitus with hyperglycemia, without long-term current use of insulin (HCC) Good blood sugar control is important to decrease the likelihood of diabetic complications such as nephropathy, neuropathy, limb loss, blindness, coronary artery disease, and death. Intensive lifestyle modification including diet, exercise and weight loss are the first line of treatment for diabetes. Continue current treatment plan.  3. Obesity with current BMI 80.7  Alexandra Beck is currently in the action stage of  change. As such, her goal is to continue with weight loss efforts. She has agreed to the Category 4 Plan.   Exercise goals: All adults should avoid inactivity. Some physical activity is better than none, and adults who participate in any amount of physical activity gain some health benefits.  Behavioral modification strategies: increasing lean protein intake, meal planning and cooking strategies, avoiding temptations, and planning for success.  Alexandra Beck has agreed to follow-up with our clinic in 3 weeks. She was informed of the importance of frequent follow-up visits to maximize her success with intensive lifestyle modifications for her multiple health conditions.   Objective:   Blood pressure 112/60, pulse 69, temperature 97.9 F (36.6 C), height 5\' 3"  (1.6 m), weight (!) 455 lb (206.4 kg), last menstrual period 12/08/2021, SpO2 96 %. Body mass index is 80.6 kg/m.  General: Cooperative, alert, well developed, in no acute distress. HEENT: Conjunctivae and lids unremarkable. Cardiovascular: Regular rhythm.  Lungs: Normal work of breathing. Neurologic: No focal deficits.   Lab Results  Component Value Date   CREATININE 1.12 (H) 09/17/2021   BUN 20 09/17/2021   NA 136 09/17/2021   K 3.7 09/17/2021   CL 96 (L) 09/17/2021   CO2 27 09/17/2021   Lab Results  Component Value Date   ALT 17 09/14/2021   AST 16 09/14/2021   ALKPHOS 89 09/14/2021   BILITOT 0.2 09/14/2021   Lab Results  Component Value Date   HGBA1C 5.9 (H) 11/11/2020   HGBA1C 5.6 12/17/2019   HGBA1C 5.6 07/04/2019   HGBA1C 5.4 12/04/2018   HGBA1C 5.8 (H) 07/25/2018   Lab  Results  Component Value Date   INSULIN 23.6 11/11/2020   INSULIN 17.1 12/17/2019   INSULIN 23.5 07/04/2019   INSULIN 25.8 (H) 12/04/2018   INSULIN 24.7 07/25/2018   Lab Results  Component Value Date   TSH 2.94 01/08/2021   Lab Results  Component Value Date   CHOL 185 11/11/2020   HDL 53 11/11/2020   LDLCALC 103 (H) 11/11/2020    LDLDIRECT 141.0 03/02/2016   TRIG 169 (H) 11/11/2020   CHOLHDL 4 03/23/2018   Lab Results  Component Value Date   VD25OH 30.2 11/11/2020   VD25OH 38.2 12/17/2019   VD25OH 34.6 07/04/2019   Lab Results  Component Value Date   WBC 7.4 09/17/2021   HGB 11.3 (L) 09/17/2021   HCT 38.5 09/17/2021   MCV 89.1 09/17/2021   PLT 266 09/17/2021    Attestation Statements:   Reviewed by clinician on day of visit: allergies, medications, problem list, medical history, surgical history, family history, social history, and previous encounter notes.  Coral Ceo, CMA, am acting as transcriptionist for Coralie Common, MD.   I have reviewed the above documentation for accuracy and completeness, and I agree with the above. - Coralie Common, MD

## 2021-12-10 ENCOUNTER — Ambulatory Visit: Payer: BC Managed Care – PPO | Admitting: Adult Health

## 2021-12-10 ENCOUNTER — Other Ambulatory Visit: Payer: Self-pay

## 2021-12-10 ENCOUNTER — Encounter: Payer: Self-pay | Admitting: Adult Health

## 2021-12-10 ENCOUNTER — Other Ambulatory Visit (HOSPITAL_COMMUNITY): Payer: Self-pay

## 2021-12-10 DIAGNOSIS — G4700 Insomnia, unspecified: Secondary | ICD-10-CM | POA: Diagnosis not present

## 2021-12-10 DIAGNOSIS — F331 Major depressive disorder, recurrent, moderate: Secondary | ICD-10-CM | POA: Diagnosis not present

## 2021-12-10 DIAGNOSIS — F428 Other obsessive-compulsive disorder: Secondary | ICD-10-CM

## 2021-12-10 MED ORDER — SERTRALINE HCL 100 MG PO TABS
100.0000 mg | ORAL_TABLET | Freq: Two times a day (BID) | ORAL | 3 refills | Status: DC
  Filled 2021-12-10: qty 180, 90d supply, fill #0
  Filled 2022-04-07: qty 180, 90d supply, fill #1
  Filled 2022-07-09: qty 180, 90d supply, fill #2
  Filled 2022-10-02: qty 180, 90d supply, fill #3

## 2021-12-10 MED ORDER — BUPROPION HCL ER (XL) 150 MG PO TB24
ORAL_TABLET | ORAL | 3 refills | Status: DC
  Filled 2021-12-10: qty 270, 90d supply, fill #0

## 2021-12-10 MED ORDER — LURASIDONE HCL 40 MG PO TABS: 40.0000 mg | ORAL_TABLET | Freq: Every day | ORAL | 0 refills | Status: DC

## 2021-12-10 NOTE — Progress Notes (Signed)
Alexandra Beck [id number] [birth year] 41 y.o.  Subjective:   Patient ID:  Alexandra Beck is a 41 y.o. (DOB Mar 22, 1981) female.  Chief Complaint: No chief complaint on file.   HPI Alexandra Beck presents to the office today for follow-up of insomnia, obsessional thoughts, and depression.  Describes mood today as "not the best". Pleasant. Increased tearfulness. Mood symptoms - reports depression and irritability.  Feels more "melancholy". Denies anxiety. Reports over thinking - "getting trapped in my mind". Stating "I'm not doing as well as I was". Medication not as helpful. Reports "restlessness" from the Abilify. Feels detached from surroundings. More "internally" occupied.Seeing therapist - Alexandra Beck for therapy. Varying interest and motivation. Taking medications as prescribed.  Energy levels low. Active, does not have a regular exercise routine.  Enjoys some usual interests and activities. Married. Lives with wife and 2 cats. Spending time with family. Gaming online with friends. Appetite adequate. Weight loss. Working with a weight management clinic.  Sleeping well most nights. Averages 7 to 8 hours.  Focus and concentration stable. Completing tasks. Managing minimal aspects of household. Works 12 to 9 - at [employer].  Denies SI or HI.  Denies AH or VH. Seeing Alexandra Beck for therapy.  Previous medication trials: Trialed on Wellbutrin at age 41, Trintellix-weird dreams.   [place] Office Visit from 09/22/2021 in [facility] Office Visit from 09/15/2021 in [place] at [facility] Visit from 04/17/2018 in [place] Office Visit from 02/27/2018 in [facility] Office Visit from 10/11/2017 in [facility]  PHQ-2 Total Score 1 1 6  0 0  PHQ-9 Total Score -- 13 23 -- --      [place] [facility] from 09/17/2021 in [place] No Risk        Review of Systems:  Review of Systems  Musculoskeletal:  Negative for gait problem.  Neurological:  Negative for tremors.  Psychiatric/Behavioral:         Please refer to HPI   Medications: I have reviewed the patient's current medications.  Current Outpatient Medications  Medication Sig Dispense Refill   ARIPiprazole (ABILIFY) 5 MG tablet Take 1 tablet (5 mg total) by mouth daily. 30 tablet 5   buPROPion (WELLBUTRIN XL) 150 MG 24 hr tablet Take three tablets every morning. (Patient taking differently: Take 450 mg by mouth daily. Take three tablets every morning.) 90 tablet 5   cyclobenzaprine (FLEXERIL) 5 MG tablet Take 1 tablet (5 mg total) by mouth 3 (three) times daily as needed for muscle spasms. 90 tablet 1   ibuprofen (ADVIL) 200 MG tablet Take 1,600 mg by mouth every 6 (six) hours as needed for headache or moderate pain.     levothyroxine (SYNTHROID) 150 MCG tablet TAKE 1 TABLET(150 MCG) BY MOUTH DAILY BEFORE AND BREAKFAST (Patient taking differently: Take 150 mcg by mouth daily before breakfast.) 90 tablet 2   losartan (COZAAR) 100 MG tablet Take 1 tablet (100 mg total) by mouth daily. Please schedule appt for future refills. Thank you 90 tablet 3   metFORMIN (GLUCOPHAGE) 500 MG tablet TAKE 1 TABLET(500 MG) BY MOUTH DAILY (Patient taking differently: Take 500 mg by mouth daily with breakfast.) 90 tablet 3   metoprolol succinate (TOPROL-XL) 50 MG 24 hr tablet TAKE 1 TABLET BY MOUTH DAILY WITH OR IMMEDIATELY FOLLOWING A  MEAL (Patient taking differently: Take 50 mg by mouth daily.) 90 tablet 3   modafinil (PROVIGIL) 100 MG tablet Take 1 tablet (100 mg total) by mouth daily. Along with 200 mg 60 tablet 1   modafinil (PROVIGIL) 200 MG tablet TAKE 1 TABLET(200 MG) BY MOUTH DAILY AT 9 AM (Patient taking differently: Take 200 mg by mouth daily. Along with 100 mg) 60 tablet 3   rosuvastatin (CRESTOR) 10 MG tablet  TAKE 1 TABLET(10 MG) BY MOUTH DAILY (Patient taking differently: Take 10 mg by mouth daily.) 90 tablet 3   sertraline (ZOLOFT) 100 MG tablet Take 1 tablet (100 mg total) by mouth 2 (two) times daily. (Patient taking differently: Take 200 mg by mouth daily.) 60 tablet 5   spironolactone (ALDACTONE) 25 MG tablet Take 1 tablet (25 mg total) by mouth daily. 90 tablet 1   tirzepatide (MOUNJARO) 5 MG/0.5ML Pen Inject 5 mg into the skin once a week. 6 mL 0   torsemide (DEMADEX) 20 MG tablet Take 60 mg daily in the AM and 40 mg daily in the PM as needed. 450 tablet 2   traMADol (ULTRAM) 50 MG tablet Take 1 tablet (50 mg total) by mouth every 8 (eight) hours as needed. (Patient taking differently: Take 50 mg by mouth every 8 (eight) hours as needed for moderate pain.) 20 tablet 0   triamcinolone cream (KENALOG) 0.1 % Apply 1 application topically 2 (two) times daily. 30 g 0   No current facility-administered medications for this visit.    Medication Side Effects: None  Allergies:  Allergies  Allergen Reactions   Aspirin Hives and Swelling    REACTION: throat swelling, hives Other reaction(s): Unknown   Doxycycline Other (See Comments)    Abdominal pain Other reaction(s): Unknown   Lisinopril Cough   Niacin And Related     Other reaction(s): Unknown   Niaspan [Niacin Er]     Caused flushing    Past Medical History:  Diagnosis Date   Acute renal failure (ARF) ([place]) 11/2012    multifactorial-likely secondary to ATN in the setting of sepsis and hypotension, and also from rhabdomyolysis   Anemia    Cellulitis    Chronic diastolic CHF (congestive heart failure) (HCC)    Depression    Diabetes mellitus (HCC)    GERD (gastroesophageal reflux disease)    Hyperlipidemia    Hypertension    Hypothyroidism    Morbid obesity with BMI of 70 and over, adult ([place])    Obesity hypoventilation syndrome (HCC)    OSA (obstructive sleep apnea)    PVC's (premature ventricular contractions)    Recurrent  cellulitis of lower leg    LLE, venous insuff   Sepsis ([place]) 11/2012   Secondary to cellulitis   Sleep apnea    Venous insufficiency of leg     Past Medical History, Surgical history, Social history, and Family history were reviewed and updated as appropriate.   Please see review of systems for further details on the patient's review from today.   Objective:   Physical Exam:  LMP 12/08/2021   Physical Exam Constitutional:      General: She is not in acute distress. Musculoskeletal:        General: No deformity.  Neurological:     Mental Status: She is alert and oriented to person, place, and time.     Coordination: Coordination normal.  Psychiatric:        Attention and Perception: Attention and perception normal. She does not perceive  auditory or visual hallucinations.        Mood and Affect: Mood normal. Mood is not anxious or depressed. Affect is not labile, blunt, angry or inappropriate.        Speech: Speech normal.        Behavior: Behavior normal.        Thought Content: Thought content normal. Thought content is not paranoid or delusional. Thought content does not include homicidal or suicidal ideation. Thought content does not include homicidal or suicidal plan.        Cognition and Memory: Cognition and memory normal.        Judgment: Judgment normal.     Comments: Insight intact    Lab Review:     Component Value Date/Time   NA 136 09/17/2021 2325   NA 141 09/14/2021 1331   K 3.7 09/17/2021 2325   CL 96 (L) 09/17/2021 2325   CO2 27 09/17/2021 2325   GLUCOSE 98 09/17/2021 2325   BUN 20 09/17/2021 2325   BUN 26 (H) 09/14/2021 1331   CREATININE 1.12 (H) 09/17/2021 2325   CREATININE 1.06 11/14/2016 1050   CALCIUM 9.1 09/17/2021 2325   CALCIUM 9.6 06/26/2007 0000   PROT 7.2 09/14/2021 1331   ALBUMIN 4.1 09/14/2021 1331   AST 16 09/14/2021 1331   ALT 17 09/14/2021 1331   ALKPHOS 89 09/14/2021 1331   BILITOT 0.2 09/14/2021 1331   GFRNONAA >60 09/17/2021  2325   GFRAA 81 11/11/2020 1211       Component Value Date/Time   WBC 7.4 09/17/2021 2325   RBC 4.32 09/17/2021 2325   HGB 11.3 (L) 09/17/2021 2325   HGB 10.7 (L) 09/14/2021 1332   HCT 38.5 09/17/2021 2325   HCT 34.3 09/14/2021 1332   PLT 266 09/17/2021 2325   PLT 229 09/14/2021 1332   MCV 89.1 09/17/2021 2325   MCV 87 09/14/2021 1332   MCH 26.2 09/17/2021 2325   MCHC 29.4 (L) 09/17/2021 2325   RDW 15.7 (H) 09/17/2021 2325   RDW 15.0 09/14/2021 1332   LYMPHSABS 1.8 09/17/2021 2325   LYMPHSABS 1.4 07/25/2018 1234   MONOABS 0.7 09/17/2021 2325   EOSABS 0.3 09/17/2021 2325   EOSABS 0.2 07/25/2018 1234   BASOSABS 0.1 09/17/2021 2325   BASOSABS 0.0 07/25/2018 1234    No results found for: POCLITH, LITHIUM   No results found for: PHENYTOIN, PHENOBARB, VALPROATE, CBMZ   .res Assessment: Plan:    Plan:   PDMP reviewed  1. Wellbutrin XL 450mg . Denies seizure history. 2. Zoloft 100mg  daily - take two tablets daily 3. Abilify 5mg  - one tablet daily. 4. Add Latuda 40mg  daily - 20mg  daily x 7 days - then increase to 40mg . Samples given.  RTC 3 months  Patient advised to contact office with any questions, adverse effects, or acute worsening in signs and symptoms.  Discussed potential metabolic side effects associated with atypical antipsychotics, as well as potential risk for movement side effects. Advised pt to contact office if movement side effects occur.   There are no diagnoses linked to this encounter.   Please see After Visit Summary for patient specific instructions.  Future Appointments  Date Time Provider [place]  12/15/2021  1:00 PM Beck, Lucious Groves, LCSW [facility] None  12/22/2021  1:00 PM Beck, Alexandra G, LCSW [facility] None  12/29/2021  1:00 PM Beck, Alexandra G, LCSW [facility] None  01/05/2022 12:40 PM Laqueta Linden, MD [facility] None  01/05/2022  1:00 PM Beck, Lucious Groves, LCSW  [facility] None  04/29/2022  2:00 PM Martyn Ehrich, NP [facility] None     No orders of the defined types were placed in this encounter.   -------------------------------

## 2021-12-15 ENCOUNTER — Ambulatory Visit (INDEPENDENT_AMBULATORY_CARE_PROVIDER_SITE_OTHER): Payer: BC Managed Care – PPO | Admitting: Psychology

## 2021-12-15 DIAGNOSIS — F331 Major depressive disorder, recurrent, moderate: Secondary | ICD-10-CM

## 2021-12-15 NOTE — Progress Notes (Signed)
[place] Counselor/Therapist Progress Note  Patient ID: Alexandra Beck, MRN: [id number],    Date: 12/15/2021  Time Spent: 60 minutes  Treatment Type: Individual Therapy  Reported Symptoms: sadness,   Mental Status Exam: Appearance:  Casual     Behavior: Appropriate  Motor: Normal  Speech/Language:  Normal Rate  Affect: Appropriate  Mood: depressed  Thought process: normal  Thought content:   WNL  Sensory/Perceptual disturbances:   WNL  Orientation: oriented to person, place, time/date, and situation  Attention: Good  Concentration: Good  Memory: WNL  Fund of knowledge:  Good  Insight:   Good  Judgment:  Good  Impulse Control: Good   Risk Assessment: Danger to Self:  No Self-injurious Behavior: No Danger to Others: No Duty to Warn:no Physical Aggression / Violence:No  Access to Firearms a concern: No  Gang Involvement:No   Subjective: The patient attended a face-to-face individual therapy session today via video visit due to COVID-19.  The patient gave verbal consent for the video visit to be on WebEx.  The patient was in her home alone and the therapist was in the office.  The patient presents with a blunted affect and mood is pleasant.  The patient talked about having a better week with her wife this week.  We talked about the situation with her friend [name] and she is trying to make some changes to detach somewhat from him.  The patient has been able to see where she could be perceived as having an emotional affair.  The patient talked about the things that she is doing to detach.  I encouraged her to grieve because this will be a different existence for her now that she is making this change.  We talked about potentially going back to doing EMDR so that she can feel better about herself.   Interventions: Cognitive Behavioral Therapy, Assertiveness/Communication, and Problem solving  Diagnosis:Major depressive disorder, recurrent episode, moderate  (HCC)  Plan: Treatment Plan  Strengths/Abilities:  Intelligent, ability for insight, supportive wife  Treatment Preferences:  Outpatient Individual therapy  Statement of Needs:  "I Need help with my depression and motivation. "  Symptoms:Depressed or irritable mood.:(Status: improved). Diminished interest in or  enjoyment of activities.(Status: improved). Feelings of hopelessness,  worthlessness, or inappropriate guilt.: (Status: improved). History of chronic or recurrent depression for which the client has taken antidepressant medication, been hospitalized, had  outpatient treatment, or had a course of electroconvulsive therapy.(Status:  maintained). Lack of energy.: (Status: maintained). Low self-esteem.:  (Status: maintained). Poor concentration and indecisiveness.:  (Status: improved). Social withdrawal.:  (Status: maintained).  Unresolved grief issues.: (Status: maintained).  Problems Addressed:  Unipolar Depression  Goals:  LTG:1. Alleviate depressive symptoms and return to previous level of effective  functioning.  30% 2. Appropriately grieve the loss in order to normalize mood and to return  to previously adaptive level of functioning. 3. Develop healthy interpersonal relationships that lead to the alleviation  and help prevent the relapse of depression.  30% 4. Develop healthy thinking patterns and beliefs about self, others, and the world that lead to the alleviation and help prevent the relapse of  Depression  30% STG:1.Describe current and past experiences with depression including their impact on functioning and  attempts to resolve it.  50 % 2. Identify and replace thoughts and beliefs that support depression.  20 % 3.Learn and implement behavioral strategies to overcome depression. 20% 4.Verbalize an understanding of healthy and unhealthy emotions with the intent of increasing the use of  healthy emotions to guide actions.  20 %  Target Date:   10/07/2022 Frequency:  Weekly Modality:Individual Interventions by Therapist:  CBT, problem solving, EMDR, insight oriented  Patient approved Treatment Plan  Lakethia Coppess G Yancy Hascall, LCSW                  Arley Garant G Toshia Larkin, LCSW

## 2021-12-17 ENCOUNTER — Other Ambulatory Visit (HOSPITAL_COMMUNITY): Payer: Self-pay

## 2021-12-18 ENCOUNTER — Other Ambulatory Visit (HOSPITAL_COMMUNITY): Payer: Self-pay

## 2021-12-18 MED ORDER — SERTRALINE HCL 100 MG PO TABS: 100.0000 mg | ORAL_TABLET | Freq: Two times a day (BID) | ORAL | 4 refills | Status: DC

## 2021-12-18 MED ORDER — FLUTICASONE-SALMETEROL 250-50 MCG/ACT IN AEPB: 1.0000 | INHALATION_SPRAY | Freq: Two times a day (BID) | RESPIRATORY_TRACT | 1 refills | Status: DC

## 2021-12-18 MED ORDER — MOUNJARO 2.5 MG/0.5ML ~~LOC~~ SOAJ: 2.5000 mg | SUBCUTANEOUS | 0 refills | Status: DC

## 2021-12-18 MED ORDER — ARIPIPRAZOLE 5 MG PO TABS: 5.0000 mg | ORAL_TABLET | Freq: Every day | ORAL | 1 refills | Status: DC

## 2021-12-18 MED ORDER — OZEMPIC (0.25 OR 0.5 MG/DOSE) 2 MG/1.5ML ~~LOC~~ SOPN: 0.5000 mg | PEN_INJECTOR | SUBCUTANEOUS | 0 refills | Status: DC

## 2021-12-18 MED ORDER — BUPROPION HCL ER (XL) 150 MG PO TB24: 450.0000 mg | ORAL_TABLET | Freq: Every morning | ORAL | 5 refills | Status: DC

## 2021-12-18 MED ORDER — CEFADROXIL 500 MG PO CAPS: 500.0000 mg | ORAL_CAPSULE | Freq: Two times a day (BID) | ORAL | 2 refills | Status: DC

## 2021-12-18 MED ORDER — ALBUTEROL SULFATE HFA 108 (90 BASE) MCG/ACT IN AERS: 2.0000 | INHALATION_SPRAY | Freq: Four times a day (QID) | RESPIRATORY_TRACT | 1 refills | Status: DC | PRN

## 2021-12-22 ENCOUNTER — Ambulatory Visit (INDEPENDENT_AMBULATORY_CARE_PROVIDER_SITE_OTHER): Payer: BC Managed Care – PPO | Admitting: Psychology

## 2021-12-22 DIAGNOSIS — F331 Major depressive disorder, recurrent, moderate: Secondary | ICD-10-CM | POA: Diagnosis not present

## 2021-12-22 NOTE — Progress Notes (Signed)
[place] Counselor/Therapist Progress Note  Patient ID: Alexandra Beck, MRN: [id number],    Date: 12/22/2021  Time Spent: 60 minutes  Treatment Type: Individual Therapy  Reported Symptoms: sadness,   Mental Status Exam: Appearance:  Casual     Behavior: Appropriate  Motor: Normal  Speech/Language:  Normal Rate  Affect: Appropriate  Mood: depressed  Thought process: normal  Thought content:   WNL  Sensory/Perceptual disturbances:   WNL  Orientation: oriented to person, place, time/date, and situation  Attention: Good  Concentration: Good  Memory: WNL  Fund of knowledge:  Good  Insight:   Good  Judgment:  Good  Impulse Control: Good   Risk Assessment: Danger to Self:  No Self-injurious Behavior: No Danger to Others: No Duty to Warn:no Physical Aggression / Violence:No  Access to Firearms a concern: No  Gang Involvement:No   Subjective: The patient attended a face-to-face individual therapy session today via video visit due to COVID-19.  The patient gave verbal consent for the video visit to be on WebEx.  The patient was in her home alone and the therapist was in the office.  The patient presents as tearful and depressed.  The patient says that she is doing okay, but her affect and mood are depressed.  We talked about how she is feeling about life.  She seems to be sad about having to change the relationship with her on line friend.  She is doing a good job of making changes that are making the relationship less intimate and more appropriate.  I asked the patient to write down how she thinks she would like to feel differently.  She says that she wants to be more ambitious, confident and motivated.  I suggested that she take some time and journal about this.  Interventions: Cognitive Behavioral Therapy, Assertiveness/Communication, and Problem solving  Diagnosis:Major depressive disorder, recurrent episode, moderate (HCC)  Plan: Treatment  Plan  Strengths/Abilities:  Intelligent, ability for insight, supportive wife  Treatment Preferences:  Outpatient Individual therapy  Statement of Needs:  "I Need help with my depression and motivation. "  Symptoms:Depressed or irritable mood.:(Status: improved). Diminished interest in or  enjoyment of activities.(Status: improved). Feelings of hopelessness,  worthlessness, or inappropriate guilt.: (Status: improved). History of chronic or recurrent depression for which the client has taken antidepressant medication, been hospitalized, had  outpatient treatment, or had a course of electroconvulsive therapy.(Status:  maintained). Lack of energy.: (Status: maintained). Low self-esteem.:  (Status: maintained). Poor concentration and indecisiveness.:  (Status: improved). Social withdrawal.:  (Status: maintained).  Unresolved grief issues.: (Status: maintained).  Problems Addressed:  Unipolar Depression  Goals:  LTG:1. Alleviate depressive symptoms and return to previous level of effective  functioning.  30% 2. Appropriately grieve the loss in order to normalize mood and to return  to previously adaptive level of functioning. 3. Develop healthy interpersonal relationships that lead to the alleviation  and help prevent the relapse of depression.  30% 4. Develop healthy thinking patterns and beliefs about self, others, and the world that lead to the alleviation and help prevent the relapse of  Depression  30% STG:1.Describe current and past experiences with depression including their impact on functioning and  attempts to resolve it.  50 % 2. Identify and replace thoughts and beliefs that support depression.  20 % 3.Learn and implement behavioral strategies to overcome depression. 20% 4.Verbalize an understanding of healthy and unhealthy emotions with the intent of increasing the use of  healthy emotions to guide actions.  20 %  Target Date:  10/07/2022 Frequency:   Weekly Modality:Individual Interventions by Therapist:  CBT, problem solving, EMDR, insight oriented  Patient approved Treatment Plan  Savan Ruta G Terrace Chiem, LCSW                            Jenah Vanasten G Brynlyn Dade, LCSW

## 2021-12-23 ENCOUNTER — Other Ambulatory Visit (HOSPITAL_COMMUNITY): Payer: Self-pay

## 2021-12-24 ENCOUNTER — Other Ambulatory Visit (HOSPITAL_COMMUNITY): Payer: Self-pay

## 2021-12-27 ENCOUNTER — Other Ambulatory Visit (HOSPITAL_COMMUNITY): Payer: Self-pay

## 2021-12-29 ENCOUNTER — Ambulatory Visit (INDEPENDENT_AMBULATORY_CARE_PROVIDER_SITE_OTHER): Payer: BC Managed Care – PPO | Admitting: Psychology

## 2021-12-29 DIAGNOSIS — F331 Major depressive disorder, recurrent, moderate: Secondary | ICD-10-CM | POA: Diagnosis not present

## 2021-12-29 NOTE — Progress Notes (Signed)
[place] Counselor/Therapist Progress Note  Patient ID: CLEOPHA INDELICATO, MRN: [id number],    Date: 12/29/2021  Time Spent: 60 minutes  Treatment Type: Individual Therapy  Reported Symptoms: sadness,   Mental Status Exam: Appearance:  Casual     Behavior: Appropriate  Motor: Normal  Speech/Language:  Normal Rate  Affect: Appropriate  Mood: depressed  Thought process: normal  Thought content:   WNL  Sensory/Perceptual disturbances:   WNL  Orientation: oriented to person, place, time/date, and situation  Attention: Good  Concentration: Good  Memory: WNL  Fund of knowledge:  Good  Insight:   Good  Judgment:  Good  Impulse Control: Good   Risk Assessment: Danger to Self:  No Self-injurious Behavior: No Danger to Others: No Duty to Warn:no Physical Aggression / Violence:No  Access to Firearms a concern: No  Gang Involvement:No   Subjective: The patient attended a face-to-face individual therapy session today via video visit due to COVID-19.  The patient gave verbal consent for the video visit to be on WebEx.  The patient was in her home alone and the therapist was in the office.  The patient presents with a blunted affect and depressed mood.  The patient reports that she still feels depressed.  We talked about starting the EMDR again to help her with her motivation.  We talked about her thinking about when she started struggling with taking care of herself.  I asked her to journal about this and see if we can come up with a starting point for the EMDR.  The patient continues to have difficulty with bathing ,also with eating and exercise.  Interventions: Cognitive Behavioral Therapy, Assertiveness/Communication, and Problem solving  Diagnosis:Major depressive disorder, recurrent episode, moderate (HCC)  Plan: Treatment Plan  Strengths/Abilities:  Intelligent, ability for insight, supportive wife  Treatment Preferences:  Outpatient Individual  therapy  Statement of Needs:  "I Need help with my depression and motivation. "  Symptoms:Depressed or irritable mood.:(Status: improved). Diminished interest in or  enjoyment of activities.(Status: improved). Feelings of hopelessness,  worthlessness, or inappropriate guilt.: (Status: improved). History of chronic or recurrent depression for which the client has taken antidepressant medication, been hospitalized, had  outpatient treatment, or had a course of electroconvulsive therapy.(Status:  maintained). Lack of energy.: (Status: maintained). Low self-esteem.:  (Status: maintained). Poor concentration and indecisiveness.:  (Status: improved). Social withdrawal.:  (Status: maintained).  Unresolved grief issues.: (Status: maintained).  Problems Addressed:  Unipolar Depression  Goals:  LTG:1. Alleviate depressive symptoms and return to previous level of effective  functioning.  30% 2. Appropriately grieve the loss in order to normalize mood and to return  to previously adaptive level of functioning. 3. Develop healthy interpersonal relationships that lead to the alleviation  and help prevent the relapse of depression.  30% 4. Develop healthy thinking patterns and beliefs about self, others, and the world that lead to the alleviation and help prevent the relapse of  Depression  30% STG:1.Describe current and past experiences with depression including their impact on functioning and  attempts to resolve it.  50 % 2. Identify and replace thoughts and beliefs that support depression.  20 % 3.Learn and implement behavioral strategies to overcome depression. 20% 4.Verbalize an understanding of healthy and unhealthy emotions with the intent of increasing the use of  healthy emotions to guide actions.  20 %  Target Date:  10/07/2022 Frequency:  Weekly Modality:Individual Interventions by Therapist:  CBT, problem solving, EMDR, insight oriented  Patient approved Treatment Plan  Samarion Ehle  G  Ariely Riddell, LCSW                            Korinne Greenstein G Kelsy Polack, LCSW               Johnnye Sandford G Maxamilian Amadon, LCSW

## 2022-01-05 ENCOUNTER — Ambulatory Visit (INDEPENDENT_AMBULATORY_CARE_PROVIDER_SITE_OTHER): Payer: BC Managed Care – PPO | Admitting: Psychology

## 2022-01-05 ENCOUNTER — Ambulatory Visit (INDEPENDENT_AMBULATORY_CARE_PROVIDER_SITE_OTHER): Payer: BC Managed Care – PPO | Admitting: Family Medicine

## 2022-01-05 DIAGNOSIS — F331 Major depressive disorder, recurrent, moderate: Secondary | ICD-10-CM | POA: Diagnosis not present

## 2022-01-05 NOTE — Progress Notes (Signed)
[facility] Counselor/Therapist Progress Note  Patient ID: Alexandra Beck, MRN: [id number],    Date: 01/05/2022  Time Spent: 60 minutes  Treatment Type: Individual Therapy  Reported Symptoms: sadness,   Mental Status Exam: Appearance:  Casual     Behavior: Appropriate  Motor: Normal  Speech/Language:  Normal Rate  Affect: Appropriate  Mood: depressed  Thought process: normal  Thought content:   WNL  Sensory/Perceptual disturbances:   WNL  Orientation: oriented to person, place, time/date, and situation  Attention: Good  Concentration: Good  Memory: WNL  Fund of knowledge:  Good  Insight:   Good  Judgment:  Good  Impulse Control: Good   Risk Assessment: Danger to Self:  No Self-injurious Behavior: No Danger to Others: No Duty to Warn:no Physical Aggression / Violence:No  Access to Firearms a concern: No  Gang Involvement:No   Subjective: The patient attended a face-to-face individual therapy session today via video visit due to COVID-19.  The patient gave verbal consent for the video visit to be on WebEx.  The patient was in her home alone and the therapist was in the office.  The patient presents with a blunted affect and depressed mood.  The patient talked about what is happening with her brother and the credit card that she gave him.  He seems to be spending an exorbitant amount of money on the credit card and they are not able to pay it down.  She states that she decreased the limit to $6500.  I talked to her about encouraging her to decrease it even more maybe down to 1000.  It seems that he is having difficulty curbing his spending habits and we talked about limit setting and how important it is to have boundaries and limits around this.  Interventions: Cognitive Behavioral Therapy, Assertiveness/Communication, and Problem solving  Diagnosis:Major depressive disorder, recurrent episode, moderate (HCC)  Plan: Treatment Plan  Strengths/Abilities:   Intelligent, ability for insight, supportive wife  Treatment Preferences:  Outpatient Individual therapy  Statement of Needs:  "I Need help with my depression and motivation. "  Symptoms:Depressed or irritable mood.:(Status: improved). Diminished interest in or  enjoyment of activities.(Status: improved). Feelings of hopelessness,  worthlessness, or inappropriate guilt.: (Status: improved). History of chronic or recurrent depression for which the client has taken antidepressant medication, been hospitalized, had  outpatient treatment, or had a course of electroconvulsive therapy.(Status:  maintained). Lack of energy.: (Status: maintained). Low self-esteem.:  (Status: maintained). Poor concentration and indecisiveness.:  (Status: improved). Social withdrawal.:  (Status: maintained).  Unresolved grief issues.: (Status: maintained).  Problems Addressed:  Unipolar Depression  Goals:  LTG:1. Alleviate depressive symptoms and return to previous level of effective  functioning.  30% 2. Appropriately grieve the loss in order to normalize mood and to return  to previously adaptive level of functioning. 3. Develop healthy interpersonal relationships that lead to the alleviation  and help prevent the relapse of depression.  30% 4. Develop healthy thinking patterns and beliefs about self, others, and the world that lead to the alleviation and help prevent the relapse of  Depression  30% STG:1.Describe current and past experiences with depression including their impact on functioning and  attempts to resolve it.  50 % 2. Identify and replace thoughts and beliefs that support depression.  20 % 3.Learn and implement behavioral strategies to overcome depression. 20% 4.Verbalize an understanding of healthy and unhealthy emotions with the intent of increasing the use of  healthy emotions to guide actions.  20 %  Target Date:  10/07/2022 Frequency:  Weekly Modality:Individual Interventions by  Therapist:  CBT, problem solving, EMDR, insight oriented  Patient approved Treatment Plan  Dondrell Loudermilk G Stephen Turnbaugh, LCSW                            Lovell Roe G Isair Inabinet, LCSW               Michaelangelo Mittelman G Annalena Piatt, LCSW               Katianne Barre G Quinci Gavidia, LCSW

## 2022-01-07 ENCOUNTER — Ambulatory Visit: Payer: BC Managed Care – PPO | Admitting: Adult Health

## 2022-01-07 ENCOUNTER — Other Ambulatory Visit (HOSPITAL_COMMUNITY): Payer: Self-pay

## 2022-01-07 ENCOUNTER — Other Ambulatory Visit: Payer: Self-pay

## 2022-01-07 ENCOUNTER — Encounter: Payer: Self-pay | Admitting: Adult Health

## 2022-01-07 DIAGNOSIS — F428 Other obsessive-compulsive disorder: Secondary | ICD-10-CM

## 2022-01-07 DIAGNOSIS — G4700 Insomnia, unspecified: Secondary | ICD-10-CM

## 2022-01-07 DIAGNOSIS — F331 Major depressive disorder, recurrent, moderate: Secondary | ICD-10-CM

## 2022-01-07 MED ORDER — LURASIDONE HCL 40 MG PO TABS
40.0000 mg | ORAL_TABLET | Freq: Every day | ORAL | 2 refills | Status: DC
  Filled 2022-01-07 – 2022-01-21 (×2): qty 30, 30d supply, fill #0

## 2022-01-07 MED ORDER — LURASIDONE HCL 40 MG PO TABS: 40.0000 mg | ORAL_TABLET | Freq: Every day | ORAL | 2 refills | Status: DC

## 2022-01-07 NOTE — Progress Notes (Signed)
Alexandra Beck [id number] 12-06-1980 41 y.o.  Subjective:   Patient ID:  Alexandra Beck is a 41 y.o. (DOB 1981/11/22) female.  Chief Complaint: No chief complaint on file.   HPI Alexandra Beck presents to the office today for follow-up of insomnia, obsessional thoughts, and depression.  Describes mood today as "not good". Pleasant. Decreased tearfulness. Mood symptoms - reports depression and irritability. Feels like she is lacking patience. Denies anxiety. Reports decreased over thinking. Feels detached from things going on around her. Doesn't get excited about things. Not wanting to leave the house. Feels like the Wellbutrin may be causing bad dreams. Willing to make changes to medications.Seeing therapist - Bambi Cottle for therapy. Varying interest and motivation. Taking medications as prescribed. Energy levels low. Active, does not have a regular exercise routine.  Enjoys some usual interests and activities. Married. Lives with wife and 2 cats. Spending time with family. Gaming online with friends. Appetite adequate. Weight loss. Working with a weight management clinic.  Sleeping well most nights. Averages 7 to 8 hours - having vivid. Focus and concentration stable. Completing tasks. Managing minimal aspects of household. Works 12 to 9 - at [employer].  Denies SI or HI.  Denies AH or VH. Seeing Bambi Cottle for therapy.  Previous medication trials: Trialed on Wellbutrin at age 15, Trintellix-weird dreams.   [facility] Office Visit from 09/22/2021 in [facility] Office Visit from 09/15/2021 in [facility] Visit from 04/17/2018 in [facility] Office Visit from 02/27/2018 in [facility] for [facility] Office Visit from 10/11/2017 in [facility] for Infectious Disease  PHQ-2 Total Score 1 1 6  0 0  PHQ-9 Total Score -- 13 23 -- --      [place]  from 09/17/2021 in [facility] No Risk        Review of Systems:  Review of Systems  Musculoskeletal:  Negative for gait problem.  Neurological:  Negative for tremors.  Psychiatric/Behavioral:         Please refer to HPI   Medications: I have reviewed the patient's current medications.  Current Outpatient Medications  Medication Sig Dispense Refill   albuterol (VENTOLIN HFA) 108 (90 Base) MCG/ACT inhaler Inhale 2 puffs into the lungs every 6 (six) hours as needed for wheezing or shortness of breath 20.1 g 1   buPROPion (WELLBUTRIN XL) 150 MG 24 hr tablet Take 3 tablets every morning. 270 tablet 3   buPROPion (WELLBUTRIN XL) 150 MG 24 hr tablet Take 3 tablets (450 mg total) by mouth every morning. 90 tablet 5   cefadroxil (DURICEF) 500 MG capsule Take 1 capsule (500 mg total) by mouth 2 (two) times daily. 14 capsule 2   cyclobenzaprine (FLEXERIL) 5 MG tablet Take 1 tablet (5 mg total) by mouth 3 (three) times daily as needed for muscle spasms. 90 tablet 1   fluticasone-salmeterol (WIXELA INHUB) 250-50 MCG/ACT AEPB Inhale 1 puff into the lungs every 12 (twelve) hours. 60 each 1   ibuprofen (ADVIL) 200 MG tablet Take 1,600 mg by mouth every 6 (six) hours as needed for headache or moderate pain.     levothyroxine (SYNTHROID) 150 MCG tablet Take 1 tablet (150 mcg total) by mouth daily before breakfast. 90 tablet 2   losartan (COZAAR) 100 MG tablet Take 1 tablet (100 mg total) by mouth daily.  90 tablet 3   lurasidone (LATUDA) 40 MG TABS tablet Take 1 tablet (40 mg total) by mouth daily with breakfast. 30 tablet 2   metFORMIN (GLUCOPHAGE) 500 MG tablet TAKE 1 TABLET(500 MG) BY MOUTH DAILY (Patient taking differently: Take 500 mg by mouth daily with breakfast.) 90 tablet 3   metoprolol succinate (TOPROL-XL) 50 MG 24 hr tablet TAKE 1 TABLET BY MOUTH DAILY WITH OR IMMEDIATELY FOLLOWING A MEAL (Patient taking differently: Take 50 mg by  mouth daily.) 90 tablet 3   modafinil (PROVIGIL) 100 MG tablet Take 1 tablet (100 mg total) by mouth daily in the morning. Along with 200 mg 60 tablet 1   modafinil (PROVIGIL) 200 MG tablet TAKE 1 TABLET(200 MG) BY MOUTH DAILY AT 9 AM (Patient taking differently: Take 200 mg by mouth daily. Along with 100 mg) 60 tablet 3   rosuvastatin (CRESTOR) 10 MG tablet TAKE 1 TABLET(10 MG) BY MOUTH DAILY (Patient taking differently: Take 10 mg by mouth daily.) 90 tablet 3   Semaglutide,0.25 or 0.5MG /DOS, (OZEMPIC, 0.25 OR 0.5 MG/DOSE,) 2 MG/1.5ML SOPN Inject 0.5 mg into the skin once a week. 1.5 mL 0   sertraline (ZOLOFT) 100 MG tablet Take 1 tablet by mouth 2 times daily. 180 tablet 3   sertraline (ZOLOFT) 100 MG tablet Take 1 tablet (100 mg total) by mouth 2 (two) times daily. 60 tablet 4   spironolactone (ALDACTONE) 25 MG tablet Take 1 tablet (25 mg total) by mouth daily. 90 tablet 1   tirzepatide (MOUNJARO) 2.5 MG/0.5ML Pen Inject 2.5 mg into the skin once a week. 2 mL 0   tirzepatide (MOUNJARO) 5 MG/0.5ML Pen Inject 5 mg into the skin once a week. 6 mL 0   torsemide (DEMADEX) 20 MG tablet Take 3 tablets (60 mg total) by mouth in the morning AND 2 tablets (40 mg total) every evening as needed 450 tablet 2   traMADol (ULTRAM) 50 MG tablet Take 1 tablet (50 mg total) by mouth every 8 (eight) hours as needed. (Patient taking differently: Take 50 mg by mouth every 8 (eight) hours as needed for moderate pain.) 20 tablet 0   triamcinolone cream (KENALOG) 0.1 % Apply 1 application topically 2 (two) times daily. 30 g 0   No current facility-administered medications for this visit.    Medication Side Effects: None  Allergies:  Allergies  Allergen Reactions   Aspirin Hives and Swelling    REACTION: throat swelling, hives Other reaction(s): Unknown   Doxycycline Other (See Comments)    Abdominal pain Other reaction(s): Unknown   Lisinopril Cough   Niacin And Related     Other reaction(s): Unknown    Niaspan [Niacin Er]     Caused flushing    Past Medical History:  Diagnosis Date   Acute renal failure (ARF) ([place]) 11/2012    multifactorial-likely secondary to ATN in the setting of sepsis and hypotension, and also from rhabdomyolysis   Anemia    Cellulitis    Chronic diastolic CHF (congestive heart failure) (HCC)    Depression    Diabetes mellitus (HCC)    GERD (gastroesophageal reflux disease)    Hyperlipidemia    Hypertension    Hypothyroidism    Morbid obesity with BMI of 70 and over, adult ([facility])    Obesity hypoventilation syndrome (HCC)    OSA (obstructive sleep apnea)    PVC's (premature ventricular contractions)    Recurrent cellulitis of lower leg    LLE, venous insuff   Sepsis ([place]) 11/2012  Secondary to cellulitis   Sleep apnea    Venous insufficiency of leg     Past Medical History, Surgical history, Social history, and Family history were reviewed and updated as appropriate.   Please see review of systems for further details on the patient's review from today.   Objective:   Physical Exam:  LMP 12/08/2021   Physical Exam Constitutional:      General: She is not in acute distress. Musculoskeletal:        General: No deformity.  Neurological:     Mental Status: She is alert and oriented to person, place, and time.     Coordination: Coordination normal.  Psychiatric:        Attention and Perception: Attention and perception normal. She does not perceive auditory or visual hallucinations.        Mood and Affect: Mood normal. Mood is not anxious or depressed. Affect is not labile, blunt, angry or inappropriate.        Speech: Speech normal.        Behavior: Behavior normal.        Thought Content: Thought content normal. Thought content is not paranoid or delusional. Thought content does not include homicidal or suicidal ideation. Thought content does not include homicidal or suicidal plan.        Cognition and Memory: Cognition and memory normal.         Judgment: Judgment normal.     Comments: Insight intact    Lab Review:     Component Value Date/Time   NA 136 09/17/2021 2325   NA 141 09/14/2021 1331   K 3.7 09/17/2021 2325   CL 96 (L) 09/17/2021 2325   CO2 27 09/17/2021 2325   GLUCOSE 98 09/17/2021 2325   BUN 20 09/17/2021 2325   BUN 26 (H) 09/14/2021 1331   CREATININE 1.12 (H) 09/17/2021 2325   CREATININE 1.06 11/14/2016 1050   CALCIUM 9.1 09/17/2021 2325   CALCIUM 9.6 06/26/2007 0000   PROT 7.2 09/14/2021 1331   ALBUMIN 4.1 09/14/2021 1331   AST 16 09/14/2021 1331   ALT 17 09/14/2021 1331   ALKPHOS 89 09/14/2021 1331   BILITOT 0.2 09/14/2021 1331   GFRNONAA >60 09/17/2021 2325   GFRAA 81 11/11/2020 1211       Component Value Date/Time   WBC 7.4 09/17/2021 2325   RBC 4.32 09/17/2021 2325   HGB 11.3 (L) 09/17/2021 2325   HGB 10.7 (L) 09/14/2021 1332   HCT 38.5 09/17/2021 2325   HCT 34.3 09/14/2021 1332   PLT 266 09/17/2021 2325   PLT 229 09/14/2021 1332   MCV 89.1 09/17/2021 2325   MCV 87 09/14/2021 1332   MCH 26.2 09/17/2021 2325   MCHC 29.4 (L) 09/17/2021 2325   RDW 15.7 (H) 09/17/2021 2325   RDW 15.0 09/14/2021 1332   LYMPHSABS 1.8 09/17/2021 2325   LYMPHSABS 1.4 07/25/2018 1234   MONOABS 0.7 09/17/2021 2325   EOSABS 0.3 09/17/2021 2325   EOSABS 0.2 07/25/2018 1234   BASOSABS 0.1 09/17/2021 2325   BASOSABS 0.0 07/25/2018 1234    No results found for: POCLITH, LITHIUM   No results found for: PHENYTOIN, PHENOBARB, VALPROATE, CBMZ   .res Assessment: Plan:    Plan:   PDMP reviewed  1. D/C Wellbutrin XL 450mg  - 300mg  daily x 2 weeks - then 150mg  x 2 weeks. Denies seizure history - discussed taper off medication 2. Zoloft 100mg  daily - take two tablets daily 3. Latuda 40mg  daily - script sent  RTC 3 months  Patient advised to contact office with any questions, adverse effects, or acute worsening in signs and symptoms.  Discussed potential metabolic side effects associated with atypical  antipsychotics, as well as potential risk for movement side effects. Advised pt to contact office if movement side effects occur.    Diagnoses and all orders for this visit:  Insomnia, unspecified type  Major depressive disorder, recurrent episode, moderate (HCC) -     Discontinue: lurasidone (LATUDA) 40 MG TABS tablet; Take 1 tablet (40 mg total) by mouth daily with breakfast. -     lurasidone (LATUDA) 40 MG TABS tablet; Take 1 tablet (40 mg total) by mouth daily with breakfast.  Obsessional thoughts -     Discontinue: lurasidone (LATUDA) 40 MG TABS tablet; Take 1 tablet (40 mg total) by mouth daily with breakfast. -     lurasidone (LATUDA) 40 MG TABS tablet; Take 1 tablet (40 mg total) by mouth daily with breakfast.     Please see After Visit Summary for patient specific instructions.  Future Appointments  Date Time Provider [place]  01/12/2022  1:00 PM Cottle, Lucious Groves, LCSW [facility] None  01/17/2022 12:40 PM Laqueta Linden, MD [facility] None  01/19/2022  1:00 PM Cottle, Bambi G, LCSW [facility] None  01/26/2022  1:00 PM Cottle, Bambi G, LCSW [facility] None  02/02/2022  1:00 PM Cottle, Bambi G, LCSW [facility] None  02/04/2022  1:00 PM Titus Drone, Berdie Ogren, NP [facility] None  02/09/2022  1:00 PM Cottle, Bambi G, LCSW [facility] None  02/16/2022  1:00 PM Cottle, Bambi G, LCSW [facility] None  02/23/2022  1:00 PM Cottle, Bambi G, LCSW [facility] None  03/02/2022  1:00 PM Cottle, Bambi G, LCSW [facility] None  03/09/2022  1:00 PM Cottle, Bambi G, LCSW [facility] None  03/16/2022  1:00 PM Cottle, Bambi G, LCSW [facility] None  04/29/2022  2:00 PM Martyn Ehrich, NP [facility] None    No orders of the defined types were placed in this encounter.   -------------------------------

## 2022-01-12 ENCOUNTER — Ambulatory Visit (INDEPENDENT_AMBULATORY_CARE_PROVIDER_SITE_OTHER): Payer: BC Managed Care – PPO | Admitting: Psychology

## 2022-01-12 DIAGNOSIS — F428 Other obsessive-compulsive disorder: Secondary | ICD-10-CM | POA: Diagnosis not present

## 2022-01-12 DIAGNOSIS — F331 Major depressive disorder, recurrent, moderate: Secondary | ICD-10-CM | POA: Diagnosis not present

## 2022-01-12 NOTE — Progress Notes (Signed)
[facility] Counselor/Therapist Progress Note  Patient ID: Alexandra Beck, MRN: [id number],    Date: 01/12/2022  Time Spent: 60 minutes  Treatment Type: Individual Therapy  Reported Symptoms: sadness,   Mental Status Exam: Appearance:  Casual     Behavior: Appropriate  Motor: Normal  Speech/Language:  Normal Rate  Affect: Appropriate  Mood: depressed  Thought process: normal  Thought content:   WNL  Sensory/Perceptual disturbances:   WNL  Orientation: oriented to person, place, time/date, and situation  Attention: Good  Concentration: Good  Memory: WNL  Fund of knowledge:  Good  Insight:   Good  Judgment:  Good  Impulse Control: Good   Risk Assessment: Danger to Self:  No Self-injurious Behavior: No Danger to Others: No Duty to Warn:no Physical Aggression / Violence:No  Access to Firearms a concern: No  Gang Involvement:No   Subjective: The patient attended a face-to-face individual therapy session today via video visit due to COVID-19.  The patient gave verbal consent for the video visit to be on WebEx.  The patient was in her home alone and the therapist was in the office.  The patient presents as depressed and tearful.  The patient states that she has had a rough day for the last 2 days.  I asked her what she thought it was that caused her to go into this negative spiral.  The patient could not come up with an answer to the question.  As we talked it seems that Valentine's Day may have thrown her off and she also has been's doing over a situation with the guy that she plays the video games with that she has had a crush on.  Apparently their relationship is changing and she is grieving the loss of that relationship and she does not have enough security within herself to feel good about who she is or how to move forward.  We talked about the need for her to focus on the positive things about herself and compartmentalize some of the feelings that she is having.   She seems to be overwhelmed by the thought of it being her fault that things do not work out and that she cannot get better.  We talked about compartmentalizing and thinking positively and we will continue to move towards doing EMDR to help her find more security within herself. Interventions: Cognitive Behavioral Therapy, Assertiveness/Communication, and Problem solving  Diagnosis:Major depressive disorder, recurrent episode, moderate (HCC)  Obsessional thoughts  Plan: Treatment Plan  Strengths/Abilities:  Intelligent, ability for insight, supportive wife  Treatment Preferences:  Outpatient Individual therapy  Statement of Needs:  "I Need help with my depression and motivation. "  Symptoms:Depressed or irritable mood.:(Status: improved). Diminished interest in or  enjoyment of activities.(Status: improved). Feelings of hopelessness,  worthlessness, or inappropriate guilt.: (Status: improved). History of chronic or recurrent depression for which the client has taken antidepressant medication, been hospitalized, had  outpatient treatment, or had a course of electroconvulsive therapy.(Status:  maintained). Lack of energy.: (Status: maintained). Low self-esteem.:  (Status: maintained). Poor concentration and indecisiveness.:  (Status: improved). Social withdrawal.:  (Status: maintained).  Unresolved grief issues.: (Status: maintained).  Problems Addressed:  Unipolar Depression  Goals:  LTG:1. Alleviate depressive symptoms and return to previous level of effective  functioning.  30% 2. Appropriately grieve the loss in order to normalize mood and to return  to previously adaptive level of functioning. 3. Develop healthy interpersonal relationships that lead to the alleviation  and help prevent the relapse of depression.  30% 4. Develop healthy thinking patterns and beliefs about self, others, and the world that lead to the alleviation and help prevent the relapse of  Depression   30% STG:1.Describe current and past experiences with depression including their impact on functioning and  attempts to resolve it.  50 % 2. Identify and replace thoughts and beliefs that support depression.  20 % 3.Learn and implement behavioral strategies to overcome depression. 20% 4.Verbalize an understanding of healthy and unhealthy emotions with the intent of increasing the use of  healthy emotions to guide actions.  20 %  Target Date:  10/07/2022 Frequency:  Weekly Modality:Individual Interventions by Therapist:  CBT, problem solving, EMDR, insight oriented  Patient approved Treatment Plan  Keturah Yerby G Bennet Kujawa, LCSW                            Alyana Kreiter G Tiffini Blacksher, LCSW               Sanja Elizardo G Hibo Blasdell, LCSW               Malala Trenkamp G Dawnette Mione, LCSW               Victor Granados [employer], LCSW

## 2022-01-13 ENCOUNTER — Other Ambulatory Visit (HOSPITAL_COMMUNITY): Payer: Self-pay

## 2022-01-17 ENCOUNTER — Other Ambulatory Visit: Payer: Self-pay

## 2022-01-17 ENCOUNTER — Encounter (INDEPENDENT_AMBULATORY_CARE_PROVIDER_SITE_OTHER): Payer: Self-pay | Admitting: Family Medicine

## 2022-01-17 ENCOUNTER — Ambulatory Visit (INDEPENDENT_AMBULATORY_CARE_PROVIDER_SITE_OTHER): Payer: BC Managed Care – PPO | Admitting: Family Medicine

## 2022-01-17 VITALS — BP 127/55 | HR 65 | Temp 98.4°F | Ht 63.0 in | Wt >= 6400 oz

## 2022-01-17 DIAGNOSIS — E669 Obesity, unspecified: Secondary | ICD-10-CM | POA: Diagnosis not present

## 2022-01-17 DIAGNOSIS — F3289 Other specified depressive episodes: Secondary | ICD-10-CM

## 2022-01-17 DIAGNOSIS — E1165 Type 2 diabetes mellitus with hyperglycemia: Secondary | ICD-10-CM

## 2022-01-17 DIAGNOSIS — Z6841 Body Mass Index (BMI) 40.0 and over, adult: Secondary | ICD-10-CM | POA: Diagnosis not present

## 2022-01-17 NOTE — Progress Notes (Signed)
Chief Complaint:   OBESITY Alexandra Beck is here to discuss her progress with her obesity treatment plan along with follow-up of her obesity related diagnoses. Alexandra Beck is on the Category 4 Plan and states she is following her eating plan approximately 70% of the time. Alexandra Beck states she is not currently exercising.  Today's visit was #: 57 Starting weight: 468 lbs Starting date: 5821/2019 Today's weight: 446 lbs Today's date: 01/17/2022 Total lbs lost to date: 22 Total lbs lost since last in-office visit: 9  Interim History: Pt's last appt was 12/08/2021. She has had quite a few ups and downs, mental health wise. She is trying to not let that affect her food wise. She is eating at least twice a day. Pt did eat a slice of cake for Valentine's Day weekend. On days she hasn't gotten 3 meals a day or food is not on plan, has been the last 30%. She is planning for grocery delivery likely tomorrow.  Subjective:   1. Other depression Pt is still adjusting meds with psychiatrist. She is still talking to a therapist weekly and is on Zoloft, Latuda, and low dose Wellbutrin. Anella denies suicidal or homicidal ideations. Intrusive thoughts have improved but pt is still feeling apathy.  2. Type 2 diabetes mellitus with hyperglycemia, without long-term current use of insulin (HCC) Pt is on Mounjaro with no GI side effects.  Assessment/Plan:   1. Other depression Behavior modification techniques were discussed today to help Vondra deal with her emotional/non-hunger eating behaviors.  Orders and follow up as documented in patient record. F/u with psych for further mediation adjustments.  2. Type 2 diabetes mellitus with hyperglycemia, without long-term current use of insulin (HCC) Good blood sugar control is important to decrease the likelihood of diabetic complications such as nephropathy, neuropathy, limb loss, blindness, coronary artery disease, and death. Intensive lifestyle modification including diet,  exercise and weight loss are the first line of treatment for diabetes. Continue Mounjaro with no change in dose.  3. Obesity with current BMI of 79.1 Alexandra Beck is currently in the action stage of change. As such, her goal is to continue with weight loss efforts. She has agreed to the Category 4 Plan.   Exercise goals: No exercise has been prescribed at this time.  Behavioral modification strategies: increasing lean protein intake, meal planning and cooking strategies, keeping healthy foods in the home, and planning for success.  Alexandra Beck has agreed to follow-up with our clinic in 3 weeks. She was informed of the importance of frequent follow-up visits to maximize her success with intensive lifestyle modifications for her multiple health conditions.   Objective:   Blood pressure (!) 127/55, pulse 65, temperature 98.4 F (36.9 C), height 5\' 3"  (1.6 m), weight (!) 446 lb (202.3 kg), last menstrual period 01/03/2022, SpO2 94 %. Body mass index is 79.01 kg/m.  General: Cooperative, alert, well developed, in no acute distress. HEENT: Conjunctivae and lids unremarkable. Cardiovascular: Regular rhythm.  Lungs: Normal work of breathing. Neurologic: No focal deficits.   Lab Results  Component Value Date   CREATININE 1.12 (H) 09/17/2021   BUN 20 09/17/2021   NA 136 09/17/2021   K 3.7 09/17/2021   CL 96 (L) 09/17/2021   CO2 27 09/17/2021   Lab Results  Component Value Date   ALT 17 09/14/2021   AST 16 09/14/2021   ALKPHOS 89 09/14/2021   BILITOT 0.2 09/14/2021   Lab Results  Component Value Date   HGBA1C 5.9 (H) 11/11/2020  HGBA1C 5.6 12/17/2019   HGBA1C 5.6 07/04/2019   HGBA1C 5.4 12/04/2018   HGBA1C 5.8 (H) 07/25/2018   Lab Results  Component Value Date   INSULIN 23.6 11/11/2020   INSULIN 17.1 12/17/2019   INSULIN 23.5 07/04/2019   INSULIN 25.8 (H) 12/04/2018   INSULIN 24.7 07/25/2018   Lab Results  Component Value Date   TSH 2.94 01/08/2021   Lab Results  Component Value  Date   CHOL 185 11/11/2020   HDL 53 11/11/2020   LDLCALC 103 (H) 11/11/2020   LDLDIRECT 141.0 03/02/2016   TRIG 169 (H) 11/11/2020   CHOLHDL 4 03/23/2018   Lab Results  Component Value Date   VD25OH 30.2 11/11/2020   VD25OH 38.2 12/17/2019   VD25OH 34.6 07/04/2019   Lab Results  Component Value Date   WBC 7.4 09/17/2021   HGB 11.3 (L) 09/17/2021   HCT 38.5 09/17/2021   MCV 89.1 09/17/2021   PLT 266 09/17/2021     Attestation Statements:   Reviewed by clinician on day of visit: allergies, medications, problem list, medical history, surgical history, family history, social history, and previous encounter notes.  Coral Ceo, CMA, am acting as transcriptionist for Coralie Common, MD.   I have reviewed the above documentation for accuracy and completeness, and I agree with the above. - Coralie Common, MD

## 2022-01-18 ENCOUNTER — Other Ambulatory Visit (HOSPITAL_COMMUNITY): Payer: Self-pay

## 2022-01-19 ENCOUNTER — Ambulatory Visit (INDEPENDENT_AMBULATORY_CARE_PROVIDER_SITE_OTHER): Payer: BC Managed Care – PPO | Admitting: Psychology

## 2022-01-19 DIAGNOSIS — F428 Other obsessive-compulsive disorder: Secondary | ICD-10-CM

## 2022-01-19 DIAGNOSIS — F331 Major depressive disorder, recurrent, moderate: Secondary | ICD-10-CM

## 2022-01-19 NOTE — Progress Notes (Signed)
[facility] Counselor/Therapist Progress Note  Patient ID: Alexandra Beck, MRN: [id number],    Date: 01/19/2022  Time Spent: 60 minutes  Treatment Type: Individual Therapy  Reported Symptoms: sadness,   Mental Status Exam: Appearance:  Casual     Behavior: Appropriate  Motor: Normal  Speech/Language:  Normal Rate  Affect: Appropriate  Mood: depressed  Thought process: normal  Thought content:   WNL  Sensory/Perceptual disturbances:   WNL  Orientation: oriented to person, place, time/date, and situation  Attention: Good  Concentration: Good  Memory: WNL  Fund of knowledge:  Good  Insight:   Good  Judgment:  Good  Impulse Control: Good   Risk Assessment: Danger to Self:  No Self-injurious Behavior: No Danger to Others: No Duty to Warn:no Physical Aggression / Violence:No  Access to Firearms a concern: No  Gang Involvement:No   Subjective: The patient attended a face-to-face individual therapy session today via video visit due to COVID-19.  The patient gave verbal consent for the video visit to be on WebEx.  The patient was in her home alone and the therapist was in the office.  The patient presents as pleasant and cooperative.  The patient reports that she feels better than she did last week.  She was very happy about losing 9 pounds when she went to healthy weight and wellness.  We began talking today about where she thought she might have gotten the reactivity in regards to other people's opinions about her and it seems that she probably got some of this from her father and the lack of attention that she received from him.  We began talking about the negative cognition that we want to target with EMDR.  It will be "I suck".  We also talked about her thinking about what it is that she would like to replace that negative cognition with between now and our next session.    Interventions: Cognitive Behavioral Therapy, Assertiveness/Communication, and Problem  solving  Diagnosis:Major depressive disorder, recurrent episode, moderate (HCC)  Obsessional thoughts  Plan: Treatment Plan  Strengths/Abilities:  Intelligent, ability for insight, supportive wife  Treatment Preferences:  Outpatient Individual therapy  Statement of Needs:  "I Need help with my depression and motivation. "  Symptoms:Depressed or irritable mood.:(Status: improved). Diminished interest in or  enjoyment of activities.(Status: improved). Feelings of hopelessness,  worthlessness, or inappropriate guilt.: (Status: improved). History of chronic or recurrent depression for which the client has taken antidepressant medication, been hospitalized, had  outpatient treatment, or had a course of electroconvulsive therapy.(Status:  maintained). Lack of energy.: (Status: maintained). Low self-esteem.:  (Status: maintained). Poor concentration and indecisiveness.:  (Status: improved). Social withdrawal.:  (Status: maintained).  Unresolved grief issues.: (Status: maintained).  Problems Addressed:  Unipolar Depression  Goals:  LTG:1. Alleviate depressive symptoms and return to previous level of effective  functioning.  30% 2. Appropriately grieve the loss in order to normalize mood and to return  to previously adaptive level of functioning. 3. Develop healthy interpersonal relationships that lead to the alleviation  and help prevent the relapse of depression.  30% 4. Develop healthy thinking patterns and beliefs about self, others, and the world that lead to the alleviation and help prevent the relapse of  Depression  30% STG:1.Describe current and past experiences with depression including their impact on functioning and  attempts to resolve it.  50 % 2. Identify and replace thoughts and beliefs that support depression.  20 % 3.Learn and implement behavioral strategies to overcome depression. 20%  4.Verbalize an understanding of healthy and unhealthy emotions with the intent of  increasing the use of  healthy emotions to guide actions.  20 %  Target Date:  10/07/2022 Frequency:  Weekly Modality:Individual Interventions by Therapist:  CBT, problem solving, EMDR, insight oriented  Patient approved Treatment Plan  Sumiye Hirth G Shamarion Coots, LCSW                            Alverta Caccamo G Myanna Ziesmer, LCSW               Dinorah Masullo G Lydell Moga, LCSW               Jillian Pianka G Zohan Shiflet, LCSW               Cynia Abruzzo G Kemisha Bonnette, LCSW               Deadrian Toya [employer], LCSW

## 2022-01-20 NOTE — Progress Notes (Signed)
Alexandra Beck.[place] Phone: [phone]   Assessment and Plan:     1. Chronic bilateral low back pain with bilateral sciatica -Chronic with exacerbation, unclear etiology, initial sports medicine visit - Acute flare of chronic low back pain with radicular symptoms.  Patient has history of sciatica that typically resolves with 1 week, however the symptoms have not improved and are waxing and waning bilaterally - X-ray obtained in clinic.  My interpretation: No acute fracture or dislocation.  Cortical changes along right femoral head - We will start with conservative therapy and progress with advanced imaging if no improvement or worsening of symptoms - Start meloxicam 15 mg daily x 3 weeks.    May use remaining meloxicam as needed once daily for pain control.  Do not to use additional NSAIDs while taking meloxicam.  May use Tylenol [phone] mg to 3 times a day for breakthrough pain. - Start Flexeril 10 mg up to 3 times a day as needed for muscle spasms - Start HEP for piriformis and low back - DG Lumbar Spine 2-3 Views; Future - DG Pelvis 1-2 Views; Future    Pertinent previous records reviewed include none   Follow Up: 2 to 3 weeks for reevaluation.  Could consider advanced imaging if no improvement or worsening of symptoms   Subjective:   I, Alexandra Beck, am serving as a Education administrator for [employer]  Chief Complaint: low back radiating down to leg pain and stiffness   HPI:  01/21/2022 [name]. Patient states that she has what feels like nerve pain in the middle of her butt cheeks that radiate down her legs been going on for a week over the last couple of days her feet have been going numb at night when she bends her head down she gets radiating pain, hurt to do ADLS feels like the tendons In the back of her leg are to short  and she is stretching them out    Relevant Historical Information: Elevated BMI, hypertension, OSA, DM type II  Additional pertinent review of systems negative.   Current Outpatient Medications:    buPROPion (WELLBUTRIN XL) 150 MG 24 hr tablet, Take 3 tablets every morning. (Patient taking differently: Take 2 tablets every morning.), Disp: 270 tablet, Rfl: 3   cefadroxil (DURICEF) 500 MG capsule, Take 1 capsule (500 mg total) by mouth 2 (two) times daily., Disp: 14 capsule, Rfl: 2   cyclobenzaprine (FLEXERIL) 10 MG tablet, Take 1 tablet (10 mg total) by mouth 3 (three) times daily., Disp: 30 tablet, Rfl: 0   cyclobenzaprine (FLEXERIL) 5 MG tablet, Take 1 tablet (5 mg total) by mouth 3 (three) times daily as needed for muscle spasms., Disp: 90 tablet, Rfl: 1   ibuprofen (ADVIL) 200 MG tablet, Take 1,600 mg by mouth every 6 (six) hours as needed for headache or moderate pain., Disp: , Rfl:    levothyroxine (SYNTHROID) 150 MCG tablet, Take 1 tablet (150 mcg total) by mouth daily before breakfast., Disp: 90 tablet, Rfl: 2   losartan (COZAAR) 100 MG tablet, Take 1 tablet (100 mg total) by mouth daily., Disp: 90 tablet, Rfl: 3   lurasidone (LATUDA) 40 MG TABS tablet, Take 1 tablet (40 mg total) by mouth daily with breakfast., Disp: 30 tablet, Rfl: 2   meloxicam (MOBIC) 15 MG tablet, Take 1 tablet (15 mg total) by  mouth daily., Disp: 30 tablet, Rfl: 0   metFORMIN (GLUCOPHAGE) 500 MG tablet, TAKE 1 TABLET(500 MG) BY MOUTH DAILY (Patient taking differently: Take 500 mg by mouth daily with breakfast.), Disp: 90 tablet, Rfl: 3   metoprolol succinate (TOPROL-XL) 50 MG 24 hr tablet, TAKE 1 TABLET BY MOUTH DAILY WITH OR IMMEDIATELY FOLLOWING A MEAL (Patient taking differently: Take 50 mg by mouth daily.), Disp: 90 tablet, Rfl: 3   modafinil (PROVIGIL) 100 MG tablet, Take 1 tablet (100 mg total) by mouth daily in the morning. Along with 200 mg, Disp: 60 tablet, Rfl: 1   modafinil (PROVIGIL) 200 MG tablet, TAKE  1 TABLET(200 MG) BY MOUTH DAILY AT 9 AM (Patient taking differently: Take 200 mg by mouth daily. Along with 100 mg), Disp: 60 tablet, Rfl: 3   rosuvastatin (CRESTOR) 10 MG tablet, TAKE 1 TABLET(10 MG) BY MOUTH DAILY (Patient taking differently: Take 10 mg by mouth daily.), Disp: 90 tablet, Rfl: 3   sertraline (ZOLOFT) 100 MG tablet, Take 1 tablet by mouth 2 times daily., Disp: 180 tablet, Rfl: 3   spironolactone (ALDACTONE) 25 MG tablet, Take 1 tablet (25 mg total) by mouth daily., Disp: 90 tablet, Rfl: 1   tirzepatide (MOUNJARO) 5 MG/0.5ML Pen, Inject 5 mg into the skin once a week., Disp: 6 mL, Rfl: 0   torsemide (DEMADEX) 20 MG tablet, Take 3 tablets (60 mg total) by mouth in the morning AND 2 tablets (40 mg total) every evening as needed, Disp: 450 tablet, Rfl: 2   traMADol (ULTRAM) 50 MG tablet, Take 1 tablet (50 mg total) by mouth every 8 (eight) hours as needed. (Patient taking differently: Take 50 mg by mouth every 8 (eight) hours as needed for moderate pain.), Disp: 20 tablet, Rfl: 0   triamcinolone cream (KENALOG) 0.1 %, Apply 1 application topically 2 (two) times daily., Disp: 30 g, Rfl: 0   Objective:     Vitals:   01/21/22 1105  BP: 122/70  Pulse: 64  SpO2: (!) 89%  Weight: (!) 446 lb (202.3 kg)  Height: 5\' 3"  (1.6 m)      Body mass index is 79.01 kg/m.    Physical Exam:    Gen: Appears well, nad, nontoxic and pleasant Psych: Alert and oriented, appropriate mood and affect Neuro: sensation intact, strength is 5/5 in upper and lower extremities, muscle tone wnl Skin: no susupicious lesions or rashes  Back - Normal skin, Spine with normal alignment and no deformity.   No tenderness to vertebral process palpation.   Paraspinous muscles are not tender and without spasm Straight leg raise negative   General: awake, alert, and oriented no acute distress, nontoxic Skin: no suspicious lesions or rashes Neuro:sensation intact distally with no dificits, normal muscle tone, no  atrophy, strength 5/5 in all tested lower ext groups Psych: normal mood and affect, speech clear  Bilateral hip: No deformity, swelling or wasting ROM Fexion 80, ext 15, IR 30, ER 45 TTP greater trochanter, gluteal musculature NTTP over the hip flexors,   si joint, lumbar spine Negative log roll with FROM Negative FABER Negative FADIR Positive Piriformis test  Gait normal    Electronically signed by:  Alexandra Beck.[facility] 1:52 PM 01/24/22

## 2022-01-21 ENCOUNTER — Ambulatory Visit: Payer: BC Managed Care – PPO | Admitting: Sports Medicine

## 2022-01-21 ENCOUNTER — Ambulatory Visit (INDEPENDENT_AMBULATORY_CARE_PROVIDER_SITE_OTHER): Payer: BC Managed Care – PPO

## 2022-01-21 ENCOUNTER — Other Ambulatory Visit (HOSPITAL_COMMUNITY): Payer: Self-pay

## 2022-01-21 ENCOUNTER — Other Ambulatory Visit: Payer: Self-pay

## 2022-01-21 VITALS — BP 122/70 | HR 64 | Ht 63.0 in | Wt >= 6400 oz

## 2022-01-21 DIAGNOSIS — M545 Low back pain, unspecified: Secondary | ICD-10-CM

## 2022-01-21 DIAGNOSIS — M5441 Lumbago with sciatica, right side: Secondary | ICD-10-CM | POA: Diagnosis not present

## 2022-01-21 DIAGNOSIS — G8929 Other chronic pain: Secondary | ICD-10-CM

## 2022-01-21 DIAGNOSIS — M5442 Lumbago with sciatica, left side: Secondary | ICD-10-CM | POA: Diagnosis not present

## 2022-01-21 MED ORDER — CYCLOBENZAPRINE HCL 10 MG PO TABS
10.0000 mg | ORAL_TABLET | Freq: Three times a day (TID) | ORAL | 0 refills | Status: DC
  Filled 2022-01-21 (×2): qty 30, 10d supply, fill #0

## 2022-01-21 MED ORDER — CYCLOBENZAPRINE HCL 10 MG PO TABS
10.0000 mg | ORAL_TABLET | Freq: Every evening | ORAL | 0 refills | Status: DC | PRN
  Filled 2022-01-21: qty 30, 30d supply, fill #0

## 2022-01-21 MED ORDER — MELOXICAM 15 MG PO TABS
15.0000 mg | ORAL_TABLET | Freq: Every day | ORAL | 0 refills | Status: DC
  Filled 2022-01-21: qty 30, 30d supply, fill #0

## 2022-01-21 NOTE — Patient Instructions (Addendum)
Good to see you  Piriformis and low back HEP  Start meloxicam 15 mg daily x3 weeks. May use remaining meloxicam as needed once daily for pain control.  Do not to use additional NSAIDs while taking meloxicam.  May use Tylenol [phone] mg to 3 times a day for breakthrough pain.' Flexeril 10 mg 3 times a day as needed for muscle spasm 2-3 week follow up

## 2022-01-24 ENCOUNTER — Other Ambulatory Visit (HOSPITAL_COMMUNITY): Payer: Self-pay

## 2022-01-26 ENCOUNTER — Ambulatory Visit (INDEPENDENT_AMBULATORY_CARE_PROVIDER_SITE_OTHER): Payer: BC Managed Care – PPO | Admitting: Psychology

## 2022-01-26 DIAGNOSIS — F331 Major depressive disorder, recurrent, moderate: Secondary | ICD-10-CM

## 2022-01-26 NOTE — Progress Notes (Signed)
[facility] Counselor/Therapist Progress Note ? ?Patient ID: Alexandra Beck, MRN: [id number],   ? ?Date: 01/26/2022 ? ?Time Spent: 60 minutes ? ?Treatment Type: Individual Therapy ? ?Reported Symptoms: sadness,  ? ?Mental Status Exam: ?Appearance:  Casual     ?Behavior: Appropriate  ?Motor: Normal  ?Speech/Language:  Normal Rate  ?Affect: Appropriate  ?Mood: depressed  ?Thought process: normal  ?Thought content:   WNL  ?Sensory/Perceptual disturbances:   WNL  ?Orientation: oriented to person, place, time/date, and situation  ?Attention: Good  ?Concentration: Good  ?Memory: WNL  ?Fund of knowledge:  Good  ?Insight:   Good  ?Judgment:  Good  ?Impulse Control: Good  ? ?Risk Assessment: ?Danger to Self:  No ?Self-injurious Behavior: No ?Danger to Others: No ?Duty to Warn:no ?Physical Aggression / Violence:No  ?Access to Firearms a concern: No  ?Gang Involvement:No  ? ?Subjective: The patient attended a face-to-face individual therapy session today via video visit due to COVID-19.  The patient gave verbal consent for the video visit to be on WebEx.  The patient was in her home alone and the therapist was in the office.  The patient presents with a blunted affect and mood is pleasant.  She reports that she is having some issues with a bulging disc.  She talked about being in pain for the last week.  We talked about the relationship that she has with her online friend and she has made some excellent effort at trying to make that relationship a more appropriate relationship for her.  She does still seem sad about not being able to continue to have a flirtatious relationship with him however it is much better for her relationship with her wife.  The patient was able to acknowledge that it was better for her relationship with her wife.  We will continue to work with her toward doing EMDR to help her feel better about herself and develop more self confidence. ? ?Interventions: Cognitive Behavioral Therapy,  Assertiveness/Communication, and Problem solving ? ?Diagnosis:Major depressive disorder, recurrent episode, moderate ([place]) ? ?Plan: Treatment Plan ? ?Strengths/Abilities:  Intelligent, ability for insight, supportive wife ? ?Treatment Preferences:  Outpatient Individual therapy ? ?Statement of Needs:  "I Need help with my depression and motivation. " ? ?Symptoms:Depressed or irritable mood.:(Status: improved). Diminished interest in or  ?enjoyment of activities.(Status: improved). Feelings of hopelessness,  ?worthlessness, or inappropriate guilt.: (Status: improved). History of chronic or ?recurrent depression for which the client has taken antidepressant medication, been hospitalized, had  ?outpatient treatment, or had a course of electroconvulsive therapy.(Status:  ?maintained). Lack of energy.: (Status: maintained). Low self-esteem.:  (Status: maintained). Poor concentration and indecisiveness.:  (Status: improved). Social withdrawal.:  (Status: maintained).  ?Unresolved grief issues.: (Status: maintained). ? ?Problems Addressed:  Unipolar Depression ? ?Goals:  ?LTG:1. Alleviate depressive symptoms and return to previous level of effective  ?functioning.  30% ?2. Appropriately grieve the loss in order to normalize mood and to return  ?to previously adaptive level of functioning. ?3. Develop healthy interpersonal relationships that lead to the alleviation  ?and help prevent the relapse of depression.  30% ?4. Develop healthy thinking patterns and beliefs about self, others, and the ?world that lead to the alleviation and help prevent the relapse of  ?Depression  30% ?STG:1.Describe current and past experiences with depression including their impact on functioning and  ?attempts to resolve it.  50 % ?2. Identify and replace thoughts and beliefs that support depression.  20 % ?3.Learn and implement behavioral strategies to overcome depression.  20% ?4.Verbalize an understanding of healthy and unhealthy emotions with  the intent of increasing the use of  ?healthy emotions to guide actions.  20 % ? ?Target Date:  10/07/2022 ?Frequency:  Weekly ?Modality:Individual ?Interventions by Therapist:  CBT, problem solving, EMDR, insight oriented ? ?Patient approved Treatment Plan ? ?Jamaiya Tunnell G Chisom Muntean, LCSW ? ? ? ? ? ? ? ? ? ? ? ? ? ? ? ? ? ? ? ? ? ? ? ? ? ? ? ?Anari Evitt G Etna Forquer, LCSW ? ? ? ? ? ? ? ? ? ? ? ? ? ? ?Shyonna [name] G Palma Buster, LCSW ? ? ? ? ? ? ? ? ? ? ? ? ? ? ?Marqual Mi G Uliana Brinker, LCSW ? ? ? ? ? ? ? ? ? ? ? ? ? ? ?Noah Pelaez G Maki Hege, LCSW ? ? ? ? ? ? ? ? ? ? ? ? ? ? ?Regina Ganci G Maame Dack, LCSW ? ? ? ? ? ? ? ? ? ? ? ? ? ? ?Nalini Alcaraz G Jenniferlynn Saad, LCSW ?

## 2022-01-27 ENCOUNTER — Other Ambulatory Visit: Payer: Self-pay | Admitting: Sports Medicine

## 2022-01-27 ENCOUNTER — Encounter: Payer: Self-pay | Admitting: Sports Medicine

## 2022-01-27 DIAGNOSIS — R29898 Other symptoms and signs involving the musculoskeletal system: Secondary | ICD-10-CM

## 2022-01-27 DIAGNOSIS — R3915 Urgency of urination: Secondary | ICD-10-CM

## 2022-01-27 DIAGNOSIS — G8929 Other chronic pain: Secondary | ICD-10-CM

## 2022-01-27 DIAGNOSIS — D169 Benign neoplasm of bone and articular cartilage, unspecified: Secondary | ICD-10-CM

## 2022-01-27 DIAGNOSIS — M5442 Lumbago with sciatica, left side: Secondary | ICD-10-CM

## 2022-01-27 NOTE — Progress Notes (Signed)
Patient is experiencing worsening right hip pain rating down into the leg.  Patient had osteochondroma on right femoral head on x-rays.  Based on worsening symptoms, osteochondroma on x-ray, difficulty weightbearing due to pain, increased urinary urgency, we will further evaluate with lumbar MRI and right hip MRI.  Message sent to patient to inform her of this.  We will follow-up with patient after MRIs are performed. ?

## 2022-02-02 ENCOUNTER — Ambulatory Visit (INDEPENDENT_AMBULATORY_CARE_PROVIDER_SITE_OTHER): Payer: BC Managed Care – PPO | Admitting: Psychology

## 2022-02-02 DIAGNOSIS — F331 Major depressive disorder, recurrent, moderate: Secondary | ICD-10-CM | POA: Diagnosis not present
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
## 2022-02-04 ENCOUNTER — Telehealth (INDEPENDENT_AMBULATORY_CARE_PROVIDER_SITE_OTHER): Payer: BC Managed Care – PPO | Admitting: Adult Health

## 2022-02-04 ENCOUNTER — Other Ambulatory Visit (HOSPITAL_COMMUNITY): Payer: Self-pay

## 2022-02-04 ENCOUNTER — Encounter: Payer: Self-pay | Admitting: Adult Health

## 2022-02-04 DIAGNOSIS — F428 Other obsessive-compulsive disorder: Secondary | ICD-10-CM | POA: Diagnosis not present

## 2022-02-04 DIAGNOSIS — G4700 Insomnia, unspecified: Secondary | ICD-10-CM | POA: Diagnosis not present

## 2022-02-04 DIAGNOSIS — F331 Major depressive disorder, recurrent, moderate: Secondary | ICD-10-CM | POA: Diagnosis not present

## 2022-02-04 MED ORDER — REXULTI 0.5 MG PO TABS
0.5000 mg | ORAL_TABLET | Freq: Every day | ORAL | 2 refills | Status: DC
  Filled 2022-02-04: qty 30, 30d supply, fill #0
  Filled 2022-03-21: qty 30, 30d supply, fill #1
  Filled 2022-05-12: qty 30, 30d supply, fill #2

## 2022-02-04 NOTE — Progress Notes (Signed)
Alexandra Beck [id number] Sep 04, 1981 41 y.o.  Virtual Visit via Video Note  I connected with pt @ on 02/04/22 at  1:00 PM EST by a video enabled telemedicine application and verified that I am speaking with the correct person using two identifiers.   I discussed the limitations of evaluation and management by telemedicine and the availability of in person appointments. The patient expressed understanding and agreed to proceed.  I discussed the assessment and treatment plan with the patient. The patient was provided an opportunity to ask questions and all were answered. The patient agreed with the plan and demonstrated an understanding of the instructions.   The patient was advised to call back or seek an in-person evaluation if the symptoms worsen or if the condition fails to improve as anticipated.  I provided 25 minutes of non-face-to-face time during this encounter.  The patient was located at home.  The provider was located at [facility].   Aloha Gell, NP   Subjective:   Patient ID:  Alexandra Beck is a 41 y.o. (DOB Jul 23, 1981) female.  Chief Complaint: No chief complaint on file.   HPI Alexandra Beck presents for follow-up of insomnia, obsessional thoughts, and depression.  Describes mood today as "about the same - not so good". Pleasant. Decreased tearfulness - 1 to 2 episodes over the past few weeks. Mood symptoms - reports depression and irritability. Denies anxiety - "decreased racing negative thoughts". Mood has not be elevated. Has tapered off the Wellbutrin and has not been able to tell a difference without it. Ran out of the [place] a few weeks ago and has not been able to get refilled due to a PA. Has not noted a change in mood without it either, and is willing to try another medication.Seeing therapist - Bambi Cottle for therapy. Varying interest and motivation. Taking medications as prescribed. Energy levels low. Active, does not have a regular exercise  routine.  Enjoys some usual interests and activities. Married. Lives with wife and 2 cats. Spending time with family. Gaming online with friends. Appetite adequate not much of an appetite. Taking [place]. Weight loss. Working with a weight management clinic.  Sleeping well most nights. Narcolepsy. Averages 7 to 8 hours - having vivid dreams - "not scary" - "more reactive to them". Focus and concentration stable. Completing tasks. Managing minimal aspects of household. Works 12 to 9 - at [employer].  Denies SI or HI.  Denies AH or VH. Seeing Bambi Cottle for therapy.  Previous medication trials: Trialed on Wellbutrin at age 68, Trintellix-weird dreams, [place]    Review of Systems:  Review of Systems  Musculoskeletal:  Negative for gait problem.  Neurological:  Negative for tremors.  Psychiatric/Behavioral:         Please refer to HPI   Medications: I have reviewed the patient's current medications.  Current Outpatient Medications  Medication Sig Dispense Refill   Brexpiprazole (REXULTI) 0.5 MG TABS Take 1 tablet (0.5 mg total) by mouth daily. 30 tablet 2   buPROPion (WELLBUTRIN XL) 150 MG 24 hr tablet Take 3 tablets every morning. (Patient taking differently: Take 2 tablets every morning.) 270 tablet 3   cefadroxil (DURICEF) 500 MG capsule Take 1 capsule (500 mg total) by mouth 2 (two) times daily. 14 capsule 2   cyclobenzaprine (FLEXERIL) 10 MG tablet Take 1 tablet (10 mg total) by mouth 3 (three) times daily. 30 tablet 0   cyclobenzaprine (FLEXERIL) 5 MG tablet Take 1 tablet (5 mg  total) by mouth 3 (three) times daily as needed for muscle spasms. 90 tablet 1   ibuprofen (ADVIL) 200 MG tablet Take 1,600 mg by mouth every 6 (six) hours as needed for headache or moderate pain.     levothyroxine (SYNTHROID) 150 MCG tablet Take 1 tablet (150 mcg total) by mouth daily before breakfast. 90 tablet 2   losartan (COZAAR) 100 MG tablet Take 1 tablet (100 mg total) by mouth daily. 90 tablet  3   meloxicam (MOBIC) 15 MG tablet Take 1 tablet (15 mg total) by mouth daily. 30 tablet 0   metFORMIN (GLUCOPHAGE) 500 MG tablet TAKE 1 TABLET(500 MG) BY MOUTH DAILY (Patient taking differently: Take 500 mg by mouth daily with breakfast.) 90 tablet 3   metoprolol succinate (TOPROL-XL) 50 MG 24 hr tablet TAKE 1 TABLET BY MOUTH DAILY WITH OR IMMEDIATELY FOLLOWING A MEAL (Patient taking differently: Take 50 mg by mouth daily.) 90 tablet 3   modafinil (PROVIGIL) 100 MG tablet Take 1 tablet (100 mg total) by mouth daily in the morning. Along with 200 mg 60 tablet 1   modafinil (PROVIGIL) 200 MG tablet TAKE 1 TABLET(200 MG) BY MOUTH DAILY AT 9 AM (Patient taking differently: Take 200 mg by mouth daily. Along with 100 mg) 60 tablet 3   rosuvastatin (CRESTOR) 10 MG tablet TAKE 1 TABLET(10 MG) BY MOUTH DAILY (Patient taking differently: Take 10 mg by mouth daily.) 90 tablet 3   sertraline (ZOLOFT) 100 MG tablet Take 1 tablet by mouth 2 times daily. 180 tablet 3   spironolactone (ALDACTONE) 25 MG tablet Take 1 tablet (25 mg total) by mouth daily. 90 tablet 1   tirzepatide (MOUNJARO) 5 MG/0.5ML Pen Inject 5 mg into the skin once a week. 6 mL 0   torsemide (DEMADEX) 20 MG tablet Take 3 tablets (60 mg total) by mouth in the morning AND 2 tablets (40 mg total) every evening as needed 450 tablet 2   traMADol (ULTRAM) 50 MG tablet Take 1 tablet (50 mg total) by mouth every 8 (eight) hours as needed. (Patient taking differently: Take 50 mg by mouth every 8 (eight) hours as needed for moderate pain.) 20 tablet 0   triamcinolone cream (KENALOG) 0.1 % Apply 1 application topically 2 (two) times daily. 30 g 0   No current facility-administered medications for this visit.    Medication Side Effects: None  Allergies:  Allergies  Allergen Reactions   Aspirin Hives and Swelling    REACTION: throat swelling, hives Other reaction(s): Unknown   Doxycycline Other (See Comments)    Abdominal pain Other reaction(s):  Unknown   Lisinopril Cough   Niacin And Related     Other reaction(s): Unknown   Niaspan [Niacin Er]     Caused flushing    Past Medical History:  Diagnosis Date   Acute renal failure (ARF) ([facility]) 11/2012    multifactorial-likely secondary to ATN in the setting of sepsis and hypotension, and also from rhabdomyolysis   Anemia    Cellulitis    Chronic diastolic CHF (congestive heart failure) (HCC)    Depression    Diabetes mellitus (HCC)    GERD (gastroesophageal reflux disease)    Hyperlipidemia    Hypertension    Hypothyroidism    Morbid obesity with BMI of 70 and over, adult ([place])    Obesity hypoventilation syndrome (HCC)    OSA (obstructive sleep apnea)    PVC's (premature ventricular contractions)    Recurrent cellulitis of lower leg  LLE, venous insuff   Sepsis ([place]) 11/2012   Secondary to cellulitis   Sleep apnea    Venous insufficiency of leg     Family History  Problem Relation Age of Onset   Hypertension Mother    Diabetes Mother    High Cholesterol Mother    Thyroid disease Mother    Sleep apnea Mother    Obesity Mother    Hypertension Father    Heart disease Father        before age 22   High Cholesterol Father    Sleep apnea Father    Obesity Father     Social History   Socioeconomic History   Marital status: Single    Spouse name: Not on file   Number of children: 0   Years of education: Not on file   Highest education level: Not on file  Occupational History   Occupation: Librarian    Employer: [employer]  Tobacco Use   Smoking status: Former    Packs/day: 0.50    Years: 8.00    Pack years: 4.00    Types: Cigarettes    Quit date: 2011    Years since quitting: 12.1   Smokeless tobacco: Never  Vaping Use   Vaping Use: Never used  Substance and Sexual Activity   Alcohol use: No    Alcohol/week: 0.0 standard drinks   Drug use: No   Sexual activity: Not on file  Other Topics Concern   Not on file  Social History Narrative    Librarian, 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   Please see review of systems for further details on the patient's review from today.   Objective:   Physical Exam:  There were no vitals taken for this visit.  Physical Exam Constitutional:      General: She is not in acute distress. Musculoskeletal:        General: No deformity.  Neurological:     Mental Status: She is alert and oriented to person, place, and time.     Coordination: Coordination normal.  Psychiatric:        Attention and Perception: Attention and perception normal. She does not perceive auditory or visual hallucinations.        Mood and Affect: Mood normal. Mood is not anxious or depressed. Affect is not labile, blunt, angry or inappropriate.        Speech: Speech normal.        Behavior: Behavior normal.        Thought Content: Thought content normal. Thought content is not paranoid or delusional. Thought content does not include homicidal or suicidal ideation. Thought content does not include homicidal or suicidal plan.        Cognition and Memory: Cognition and memory normal.        Judgment: Judgment normal.     Comments: Insight intact    Lab Review:     Component Value Date/Time   NA 136 09/17/2021 2325   NA 141 09/14/2021 1331   K 3.7 09/17/2021 2325   CL 96 (L) 09/17/2021  2325   CO2 27 09/17/2021 2325   GLUCOSE 98 09/17/2021 2325   BUN 20 09/17/2021 2325   BUN 26 (H) 09/14/2021 1331   CREATININE 1.12 (H) 09/17/2021 2325   CREATININE 1.06 11/14/2016 1050   CALCIUM 9.1 09/17/2021 2325   CALCIUM 9.6  06/26/2007 0000   PROT 7.2 09/14/2021 1331   ALBUMIN 4.1 09/14/2021 1331   AST 16 09/14/2021 1331   ALT 17 09/14/2021 1331   ALKPHOS 89 09/14/2021 1331   BILITOT 0.2 09/14/2021 1331   GFRNONAA >60 09/17/2021 2325   GFRAA 81 11/11/2020 1211       Component Value Date/Time   WBC 7.4 09/17/2021 2325   RBC 4.32 09/17/2021 2325   HGB 11.3 (L) 09/17/2021 2325   HGB 10.7 (L) 09/14/2021 1332   HCT 38.5 09/17/2021 2325   HCT 34.3 09/14/2021 1332   PLT 266 09/17/2021 2325   PLT 229 09/14/2021 1332   MCV 89.1 09/17/2021 2325   MCV 87 09/14/2021 1332   MCH 26.2 09/17/2021 2325   MCHC 29.4 (L) 09/17/2021 2325   RDW 15.7 (H) 09/17/2021 2325   RDW 15.0 09/14/2021 1332   LYMPHSABS 1.8 09/17/2021 2325   LYMPHSABS 1.4 07/25/2018 1234   MONOABS 0.7 09/17/2021 2325   EOSABS 0.3 09/17/2021 2325   EOSABS 0.2 07/25/2018 1234   BASOSABS 0.1 09/17/2021 2325   BASOSABS 0.0 07/25/2018 1234    No results found for: POCLITH, LITHIUM   No results found for: PHENYTOIN, PHENOBARB, VALPROATE, CBMZ   .res Assessment: Plan:    Plan:   PDMP reviewed  Zoloft '100mg'$  daily - take two tablets daily Add Rexulti 0.'5mg'$  daily  RTC 4 weeks  Patient advised to contact office with any questions, adverse effects, or acute worsening in signs and symptoms.  Time spent with patient was 25 minutes. Greater than 50% of face to face time with patient was spent on counseling and coordination of care.    Discussed potential metabolic side effects associated with atypical antipsychotics, as well as potential risk for movement side effects. Advised pt to contact office if movement side effects occur.    Diagnoses and all orders for this visit:  Major depressive disorder, recurrent episode, moderate (HCC) -     Brexpiprazole (REXULTI) 0.5 MG TABS; Take 1 tablet (0.5 mg total) by mouth daily.  Obsessional thoughts  Insomnia, unspecified type     Please see After Visit Summary for patient specific  instructions.  Future Appointments  Date Time Provider [place]  02/09/2022  1:00 PM Cottle, Lucious Groves, LCSW [facility] None  02/11/2022 11:30 AM Glennon Mac, DO [facility] None  02/13/2022  2:00 PM [facility]-315 MR 3 GI-315MRI [facility]  02/13/2022  2:40 PM GI-315 MR 3 GI-315MRI [facility]  02/14/2022 12:20 PM Laqueta Linden, MD [facility] None  02/16/2022  1:00 PM Cottle, Lucious Groves, LCSW [facility] None  02/23/2022  1:00 PM Cottle, Bambi G, LCSW [facility] None  03/02/2022  1:00 PM Cottle, Bambi G, LCSW [facility] None  03/09/2022  1:00 PM Cottle, Bambi G, LCSW [facility] None  03/16/2022  1:00 PM Cottle, Bambi G, LCSW [facility] None  03/23/2022  1:00 PM Cottle, Bambi G, LCSW [facility] None  04/29/2022  2:00 PM Martyn Ehrich, NP [facility] None    No orders of the defined types were placed in this encounter.     -------------------------------

## 2022-02-05 ENCOUNTER — Other Ambulatory Visit (HOSPITAL_COMMUNITY): Payer: Self-pay

## 2022-02-07 ENCOUNTER — Other Ambulatory Visit (HOSPITAL_COMMUNITY): Payer: Self-pay

## 2022-02-09 ENCOUNTER — Telehealth: Payer: Self-pay | Admitting: Adult Health

## 2022-02-09 ENCOUNTER — Encounter: Payer: Self-pay | Admitting: Sports Medicine

## 2022-02-09 ENCOUNTER — Ambulatory Visit (INDEPENDENT_AMBULATORY_CARE_PROVIDER_SITE_OTHER): Payer: BC Managed Care – PPO | Admitting: Psychology

## 2022-02-09 DIAGNOSIS — F331 Major depressive disorder, recurrent, moderate: Secondary | ICD-10-CM | POA: Diagnosis not present

## 2022-02-09 NOTE — Telephone Encounter (Signed)
Please review

## 2022-02-09 NOTE — Telephone Encounter (Signed)
Pt stated that [facility] sent a PA a week and a half ago to [place] for pt's Rexaulti.  They haven't heard back.  Pls advise pt of status. ? ?No upcoming appt scheduled. ?
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
## 2022-02-10 ENCOUNTER — Other Ambulatory Visit (HOSPITAL_COMMUNITY): Payer: Self-pay

## 2022-02-10 NOTE — Telephone Encounter (Signed)
Prior Approval received effective 02/10/2022-02/10/2025 with [employer] for REXULTI 0.5 MG ?

## 2022-02-10 NOTE — Telephone Encounter (Signed)
I am not aware of a PA for her but I will initiate one with the insurance we have on file. ? ?

## 2022-02-10 NOTE — Telephone Encounter (Signed)
Prior Authorization initiated with [employer] ID# [id number] for Rexulti 0.5 mg pending response ?

## 2022-02-10 NOTE — Progress Notes (Deleted)
? ? Benito Mccreedy D.Merril Abbe ?[facility] ?[place] ?Phone: [phone] ?  ?Assessment and Plan:   ?  ?There are no diagnoses linked to this encounter.  ?*** ?  ?Pertinent previous records reviewed include *** ?  ?Follow Up: ***  ? ?  ?Subjective:   ?I, Pincus Badder, am serving as a Education administrator for [employer] ? ?Chief Complaint: low back pain  ? ?HPI:  ?01/21/2022 ?[name]. Patient states that she has what feels like nerve pain in the middle of her butt cheeks that radiate down her legs been going on for a week over the last couple of days her feet have been going numb at night when she bends her head down she gets radiating pain, hurt to do ADLS feels like the tendons In the back of her leg are to short and she is stretching them out  ?  ?02/11/2022 ?Patient states ? ?  ?Relevant Historical Information: Elevated BMI, hypertension, OSA, DM type II ? ?Additional pertinent review of systems negative. ? ? ?Current Outpatient Medications:  ?  Brexpiprazole (REXULTI) 0.5 MG TABS, Take 1 tablet by mouth daily., Disp: 30 tablet, Rfl: 2 ?  buPROPion (WELLBUTRIN XL) 150 MG 24 hr tablet, Take 3 tablets every morning. (Patient taking differently: Take 2 tablets every morning.), Disp: 270 tablet, Rfl: 3 ?  cefadroxil (DURICEF) 500 MG capsule, Take 1 capsule (500 mg total) by mouth 2 (two) times daily., Disp: 14 capsule, Rfl: 2 ?  cyclobenzaprine (FLEXERIL) 10 MG tablet, Take 1 tablet (10 mg total) by mouth 3 (three) times daily., Disp: 30 tablet, Rfl: 0 ?  cyclobenzaprine (FLEXERIL) 5 MG tablet, Take 1 tablet (5 mg total) by mouth 3 (three) times daily as needed for muscle spasms., Disp: 90 tablet, Rfl: 1 ?  ibuprofen (ADVIL) 200 MG tablet, Take 1,600 mg by mouth every 6 (six) hours as needed for headache or moderate pain., Disp: , Rfl:  ?  levothyroxine (SYNTHROID) 150 MCG tablet, Take 1  tablet (150 mcg total) by mouth daily before breakfast., Disp: 90 tablet, Rfl: 2 ?  losartan (COZAAR) 100 MG tablet, Take 1 tablet (100 mg total) by mouth daily., Disp: 90 tablet, Rfl: 3 ?  meloxicam (MOBIC) 15 MG tablet, Take 1 tablet (15 mg total) by mouth daily., Disp: 30 tablet, Rfl: 0 ?  metFORMIN (GLUCOPHAGE) 500 MG tablet, TAKE 1 TABLET(500 MG) BY MOUTH DAILY (Patient taking differently: Take 500 mg by mouth daily with breakfast.), Disp: 90 tablet, Rfl: 3 ?  metoprolol succinate (TOPROL-XL) 50 MG 24 hr tablet, TAKE 1 TABLET BY MOUTH DAILY WITH OR IMMEDIATELY FOLLOWING A MEAL (Patient taking differently: Take 50 mg by mouth daily.), Disp: 90 tablet, Rfl: 3 ?  modafinil (PROVIGIL) 100 MG tablet, Take 1 tablet (100 mg total) by mouth daily in the morning. Along with 200 mg, Disp: 60 tablet, Rfl: 1 ?  modafinil (PROVIGIL) 200 MG tablet, TAKE 1 TABLET(200 MG) BY MOUTH DAILY AT 9 AM (Patient taking differently: Take 200 mg by mouth daily. Along with 100 mg), Disp: 60 tablet, Rfl: 3 ?  rosuvastatin (CRESTOR) 10 MG tablet, TAKE 1 TABLET(10 MG) BY MOUTH DAILY (Patient taking differently: Take 10 mg by mouth daily.), Disp: 90 tablet, Rfl: 3 ?  sertraline (ZOLOFT) 100 MG tablet, Take 1 tablet by mouth 2 times daily., Disp: 180 tablet, Rfl: 3 ?  spironolactone (ALDACTONE)  25 MG tablet, Take 1 tablet (25 mg total) by mouth daily., Disp: 90 tablet, Rfl: 1 ?  tirzepatide (MOUNJARO) 5 MG/0.5ML Pen, Inject 5 mg into the skin once a week., Disp: 6 mL, Rfl: 0 ?  torsemide (DEMADEX) 20 MG tablet, Take 3 tablets (60 mg total) by mouth in the morning AND 2 tablets (40 mg total) every evening as needed, Disp: 450 tablet, Rfl: 2 ?  traMADol (ULTRAM) 50 MG tablet, Take 1 tablet (50 mg total) by mouth every 8 (eight) hours as needed. (Patient taking differently: Take 50 mg by mouth every 8 (eight) hours as needed for moderate pain.), Disp: 20 tablet, Rfl: 0 ?  triamcinolone cream (KENALOG) 0.1 %, Apply 1 application topically 2 (two)  times daily., Disp: 30 g, Rfl: 0  ? ?Objective:   ?  ?There were no vitals filed for this visit.  ?  ?There is no height or weight on file to calculate BMI.  ?  ?Physical Exam:   ? ?*** ? ? ?Electronically signed by:  ?Benito Mccreedy D.Merril Abbe ?[facility] ?3:57 PM 02/10/22 ?

## 2022-02-11 ENCOUNTER — Ambulatory Visit: Payer: BC Managed Care – PPO | Admitting: Sports Medicine

## 2022-02-11 ENCOUNTER — Other Ambulatory Visit (HOSPITAL_COMMUNITY): Payer: Self-pay

## 2022-02-13 ENCOUNTER — Ambulatory Visit
Admission: RE | Admit: 2022-02-13 | Discharge: 2022-02-13 | Disposition: A | Discharge status: Home / Self Care | Payer: BC Managed Care – PPO | Source: Ambulatory Visit | Attending: Sports Medicine | Admitting: Sports Medicine

## 2022-02-13 ENCOUNTER — Other Ambulatory Visit: Payer: Self-pay

## 2022-02-13 DIAGNOSIS — R29898 Other symptoms and signs involving the musculoskeletal system: Secondary | ICD-10-CM

## 2022-02-13 DIAGNOSIS — G8929 Other chronic pain: Secondary | ICD-10-CM

## 2022-02-13 DIAGNOSIS — D169 Benign neoplasm of bone and articular cartilage, unspecified: Secondary | ICD-10-CM

## 2022-02-13 DIAGNOSIS — R3915 Urgency of urination: Secondary | ICD-10-CM

## 2022-02-14 ENCOUNTER — Encounter (INDEPENDENT_AMBULATORY_CARE_PROVIDER_SITE_OTHER): Payer: Self-pay | Admitting: Family Medicine

## 2022-02-14 ENCOUNTER — Ambulatory Visit (INDEPENDENT_AMBULATORY_CARE_PROVIDER_SITE_OTHER): Payer: BC Managed Care – PPO | Admitting: Family Medicine

## 2022-02-14 ENCOUNTER — Other Ambulatory Visit (HOSPITAL_COMMUNITY): Payer: Self-pay

## 2022-02-14 VITALS — BP 128/75 | HR 68 | Temp 98.6°F | Ht 63.0 in | Wt >= 6400 oz

## 2022-02-14 DIAGNOSIS — E669 Obesity, unspecified: Secondary | ICD-10-CM

## 2022-02-14 DIAGNOSIS — I152 Hypertension secondary to endocrine disorders: Secondary | ICD-10-CM | POA: Diagnosis not present

## 2022-02-14 DIAGNOSIS — E1159 Type 2 diabetes mellitus with other circulatory complications: Secondary | ICD-10-CM | POA: Diagnosis not present

## 2022-02-14 DIAGNOSIS — E1165 Type 2 diabetes mellitus with hyperglycemia: Secondary | ICD-10-CM

## 2022-02-14 DIAGNOSIS — Z6841 Body Mass Index (BMI) 40.0 and over, adult: Secondary | ICD-10-CM

## 2022-02-14 DIAGNOSIS — Z7985 Long-term (current) use of injectable non-insulin antidiabetic drugs: Secondary | ICD-10-CM

## 2022-02-14 MED ORDER — TIRZEPATIDE 7.5 MG/0.5ML ~~LOC~~ SOAJ
7.5000 mg | SUBCUTANEOUS | 0 refills | Status: DC
  Filled 2022-02-14: qty 6, 84d supply, fill #0
  Filled 2022-03-04: qty 2, 28d supply, fill #0

## 2022-02-15 NOTE — Progress Notes (Signed)
? ? ? ?Chief Complaint:  ? ?OBESITY ?Alexandra Beck is here to discuss her progress with her obesity treatment plan along with follow-up of her obesity related diagnoses. Alexandra Beck is on the Category 4 Plan and states she is following her eating plan approximately 60% of the time. Alexandra Beck states she is doing 0 minutes 0 times per week. ? ?Today's visit was #: 57 ?Starting weight: 468 lbs ?Starting date: 04/17/2018 ?Today's weight: 438 lbs ?Today's date: 02/14/2022 ?Total lbs lost to date: 68 ?Total lbs lost since last in-office visit: 8 ? ?Interim History: Alexandra Beck has had a busy few weeks. She got diagnosed with herniated disk and she is in a wheelchair today. She stopped Wellbutrin due to concerns of nightmares. She is noticing re-emergence of cravings and increased ability to eat. Will be starting Rexulti. She is taking 2 meals with her to work to stay more on the plan. She is trying to not skip meals. ? ?Subjective:  ? ?1. Type 2 diabetes mellitus with hyperglycemia, without long-term current use of insulin (Alexandra Beck) ?Alexandra Beck is on Mounjaro 5 mg with good control of symptoms but she notes increased cravings. She denies GI side effects. ? ?2. Hypertension associated with diabetes (Alexandra Beck) ?Alexandra Beck's blood pressure is well controlled. She denies chest pain, chest pressure, or headache. ? ?Assessment/Plan:  ? ?1. Type 2 diabetes mellitus with hyperglycemia, without long-term current use of insulin (Alexandra Beck) ?Alexandra Beck agreed to increase Mounjaro to 7.5 mg SubQ weekly with no refills. ? ?- tirzepatide (MOUNJARO) 7.5 MG/0.5ML Pen; Inject 7.5 mg into the skin once a week.  Dispense: 6 mL; Refill: 0 ? ?2. Hypertension associated with diabetes (Alexandra Beck) ?Alexandra Beck will continue her current medications, with no change in dose. ? ?3. Obesity with current BMI of 77.61 ?Alexandra Beck is currently in the action stage of change. As such, her goal is to continue with weight loss efforts. She has agreed to the Category 4 Plan.  ? ?Exercise goals: No exercise has been prescribed at  this time. ? ?Behavioral modification strategies: increasing lean protein intake, no skipping meals, meal planning and cooking strategies, keeping healthy foods in the home, and planning for success. ? ?Alexandra Beck has agreed to follow-up with our clinic in 2 weeks with [facility], FNP, and in 4 weeks with myself. She was informed of the importance of frequent follow-up visits to maximize her success with intensive lifestyle modifications for her multiple health conditions.  ? ?Objective:  ? ?Blood pressure 128/75, pulse 68, temperature 98.6 ?F (37 ?C), height '5\' 3"'$  (1.6 m), weight (!) 438 lb (198.7 kg), SpO2 98 %. ?Body mass index is 77.59 kg/m?. ? ?General: Cooperative, alert, well developed, in no acute distress. ?HEENT: Conjunctivae and lids unremarkable. ?Cardiovascular: Regular rhythm.  ?Lungs: Normal work of breathing. ?Neurologic: No focal deficits.  ? ?Lab Results  ?Component Value Date  ? CREATININE 1.12 (H) 09/17/2021  ? BUN 20 09/17/2021  ? NA 136 09/17/2021  ? K 3.7 09/17/2021  ? CL 96 (L) 09/17/2021  ? CO2 27 09/17/2021  ? ?Lab Results  ?Component Value Date  ? ALT 17 09/14/2021  ? AST 16 09/14/2021  ? ALKPHOS 89 09/14/2021  ? BILITOT 0.2 09/14/2021  ? ?Lab Results  ?Component Value Date  ? HGBA1C 5.9 (H) 11/11/2020  ? HGBA1C 5.6 12/17/2019  ? HGBA1C 5.6 07/04/2019  ? HGBA1C 5.4 12/04/2018  ? HGBA1C 5.8 (H) 07/25/2018  ? ?Lab Results  ?Component Value Date  ? INSULIN 23.6 11/11/2020  ? INSULIN 17.1 12/17/2019  ? INSULIN 23.5  07/04/2019  ? INSULIN 25.8 (H) 12/04/2018  ? INSULIN 24.7 07/25/2018  ? ?Lab Results  ?Component Value Date  ? TSH 2.94 01/08/2021  ? ?Lab Results  ?Component Value Date  ? CHOL 185 11/11/2020  ? HDL 53 11/11/2020  ? LDLCALC 103 (H) 11/11/2020  ? LDLDIRECT 141.0 03/02/2016  ? TRIG 169 (H) 11/11/2020  ? CHOLHDL 4 03/23/2018  ? ?Lab Results  ?Component Value Date  ? VD25OH 30.2 11/11/2020  ? VD25OH 38.2 12/17/2019  ? VD25OH 34.6 07/04/2019  ? ?Lab Results  ?Component Value Date  ? WBC  7.4 09/17/2021  ? HGB 11.3 (L) 09/17/2021  ? HCT 38.5 09/17/2021  ? MCV 89.1 09/17/2021  ? PLT 266 09/17/2021  ? ?No results found for: IRON, TIBC, FERRITIN ? ?Attestation Statements:  ? ?Reviewed by clinician on day of visit: allergies, medications, problem list, medical history, surgical history, family history, social history, and previous encounter notes. ? ? ?I, Trixie Dredge, am acting as transcriptionist for Coralie Common, MD. ? ?I have reviewed the above documentation for accuracy and completeness, and I agree with the above. Coralie Common, MD ? ? ?

## 2022-02-16 ENCOUNTER — Ambulatory Visit (INDEPENDENT_AMBULATORY_CARE_PROVIDER_SITE_OTHER): Payer: BC Managed Care – PPO | Admitting: Psychology

## 2022-02-16 DIAGNOSIS — F331 Major depressive disorder, recurrent, moderate: Secondary | ICD-10-CM
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
## 2022-02-16 NOTE — Progress Notes (Signed)
? ? Alexandra Beck ?[facility] ?[place] ?Phone: [phone] ?  ?Assessment and Plan:   ?  ?1. Spinal stenosis of lumbar region, unspecified whether neurogenic claudication present ?2. DDD (degenerative disc disease), lumbar ?3. Lumbar disc herniation with radiculopathy ?-Chronic with exacerbation, uncertain prognosis, subsequent visit ?- Continued low back pain with right-sided radicular symptoms including decree sensation and weakness to right lower extremity ?- Reviewed MRI of hip and lumbar spine with patient which showed disc herniation at L5-S1 as well as moderate to severe spinal stenosis ?- We will attempt epidural injection at right L5-S1 to see if this alleviates pressure and improves patient's symptoms.  We will follow-up in clinic 2 weeks after injection.  If patient has no improvement or worsening of symptoms, would consider referral to neurosurgery to discuss surgical options as spinal stenosis would be presumed to be the most likely cause of her continued symptoms ?-May continue to use remaining meloxicam as needed for breakthrough pain ?- Recommend using Tylenol 500 to 1000 mg 2-3 times a day for day-to-day pain relief ?- Start HEP ? ?Pertinent previous records reviewed include lumbar MRI 02/13/2022, right hip MRI 02/13/2022 ?  ?Follow Up: We will attempt epidural injection at right L5-S1 to see if this alleviates pressure and improves patient's symptoms.  We will follow-up in clinic 2 weeks after injection.  If patient has no improvement or worsening of symptoms, would consider referral to neurosurgery to discuss surgical options as spinal stenosis would be presumed to be the most likely cause of her continued symptoms  ? ?  ?Subjective:   ?I, Pincus Badder, am serving as a Education administrator for [employer] ? ?Chief Complaint: low back pain  ? ?HPI:  ?01/21/2022 ?Patient is a 41 year old female complaining of low back radiating down to  leg pain. Patient states that she has what feels like nerve pain in the middle of her butt cheeks that radiate down her legs been going on for a week over the last couple of days her feet have been going numb at night when she bends her head down she gets radiating pain, hurt to do ADLS feels like the tendons In the back of her leg are to short and she is stretching them out  ?  ? 02/17/2022 ?Patient states that she is doing better, has been naturally resolving, still having weakness in her R leg below the knee , has trouble getting out of bed and shower, ankle isnt flexing hurts when she walks, meloxicam has been helping a bit , been having some numbness on her right and left calf, woke up Monday feeling she sprained her ankle in her sleep left but that has mostly resolved  ? ? ?Relevant Historical Information: Elevated BMI, hypertension, OSA, DM type II ? ?Additional pertinent review of systems negative. ? ? ?Current Outpatient Medications:  ?  Brexpiprazole (REXULTI) 0.5 MG TABS, Take 1 tablet by mouth daily., Disp: 30 tablet, Rfl: 2 ?  cefadroxil (DURICEF) 500 MG capsule, Take 1 capsule (500 mg total) by mouth 2 (two) times daily., Disp: 14 capsule, Rfl: 2 ?  cyclobenzaprine (FLEXERIL) 10 MG tablet, Take 1 tablet (10 mg total) by mouth 3 (three) times daily., Disp: 30 tablet, Rfl: 0 ?  ibuprofen (ADVIL) 200 MG tablet, Take 1,600 mg by mouth every 6 (six) hours as needed for headache or moderate pain., Disp: , Rfl:  ?  levothyroxine (SYNTHROID) 150 MCG tablet, Take 1 tablet (  150 mcg total) by mouth daily before breakfast., Disp: 90 tablet, Rfl: 2 ?  losartan (COZAAR) 100 MG tablet, Take 1 tablet (100 mg total) by mouth daily., Disp: 90 tablet, Rfl: 3 ?  meloxicam (MOBIC) 15 MG tablet, Take 1 tablet (15 mg total) by mouth daily., Disp: 30 tablet, Rfl: 0 ?  metFORMIN (GLUCOPHAGE) 500 MG tablet, TAKE 1 TABLET(500 MG) BY MOUTH DAILY (Patient taking differently: Take 500 mg by mouth daily with breakfast.), Disp: 90  tablet, Rfl: 3 ?  metoprolol succinate (TOPROL-XL) 50 MG 24 hr tablet, TAKE 1 TABLET BY MOUTH DAILY WITH OR IMMEDIATELY FOLLOWING A MEAL (Patient taking differently: Take 50 mg by mouth daily.), Disp: 90 tablet, Rfl: 3 ?  modafinil (PROVIGIL) 100 MG tablet, Take 1 tablet (100 mg total) by mouth daily in the morning. Along with 200 mg, Disp: 60 tablet, Rfl: 1 ?  modafinil (PROVIGIL) 200 MG tablet, TAKE 1 TABLET(200 MG) BY MOUTH DAILY AT 9 AM (Patient taking differently: Take 200 mg by mouth daily. Along with 100 mg), Disp: 60 tablet, Rfl: 3 ?  rosuvastatin (CRESTOR) 10 MG tablet, TAKE 1 TABLET(10 MG) BY MOUTH DAILY (Patient taking differently: Take 10 mg by mouth daily.), Disp: 90 tablet, Rfl: 3 ?  sertraline (ZOLOFT) 100 MG tablet, Take 1 tablet by mouth 2 times daily., Disp: 180 tablet, Rfl: 3 ?  spironolactone (ALDACTONE) 25 MG tablet, Take 1 tablet (25 mg total) by mouth daily., Disp: 90 tablet, Rfl: 1 ?  tirzepatide (MOUNJARO) 7.5 MG/0.5ML Pen, Inject 7.5 mg into the skin once a week., Disp: 6 mL, Rfl: 0 ?  torsemide (DEMADEX) 20 MG tablet, Take 3 tablets (60 mg total) by mouth in the morning AND 2 tablets (40 mg total) every evening as needed, Disp: 450 tablet, Rfl: 2 ?  traMADol (ULTRAM) 50 MG tablet, Take 1 tablet (50 mg total) by mouth every 8 (eight) hours as needed. (Patient taking differently: Take 50 mg by mouth every 8 (eight) hours as needed for moderate pain.), Disp: 20 tablet, Rfl: 0 ?  triamcinolone cream (KENALOG) 0.1 %, Apply 1 application topically 2 (two) times daily., Disp: 30 g, Rfl: 0  ? ?Objective:   ?  ?Vitals:  ? 02/17/22 1304  ?BP: 136/80  ?Pulse: 72  ?SpO2: 94%  ?Weight: (!) 438 lb (198.7 kg)  ?Height: '5\' 3"'$  (1.6 m)  ?  ?  ?Body mass index is 77.59 kg/m?.  ?  ?Physical Exam:   ? ?Gen: Appears well, nad, nontoxic and pleasant ?Psych: Alert and oriented, appropriate mood and affect ?Neuro: sensation decreased over bilateral lateral lower extremities.  Strength 4/5 in knee flexion and  extension on right, 4+/5 in dorsiflexion and plantarflexion on right.  Otherwise strength is 5/5 in upper and lower extremities, muscle tone wnl ?Skin: no susupicious lesions or rashes ? ?Back - Normal skin, Spine with normal alignment and no deformity.   ?No tenderness to vertebral process palpation.   ?Paraspinous muscles are not tender and without spasm ?Straight leg raise negative ?  ?Electronically signed by:  ?Alexandra Beck ?[facility] ?1:39 PM 02/17/22 ?

## 2022-02-17 ENCOUNTER — Other Ambulatory Visit: Payer: Self-pay

## 2022-02-17 ENCOUNTER — Ambulatory Visit: Payer: BC Managed Care – PPO | Admitting: Sports Medicine

## 2022-02-17 VITALS — BP 136/80 | HR 72 | Ht 63.0 in | Wt >= 6400 oz

## 2022-02-17 DIAGNOSIS — M5116 Intervertebral disc disorders with radiculopathy, lumbar region: Secondary | ICD-10-CM | POA: Diagnosis not present

## 2022-02-17 DIAGNOSIS — M48061 Spinal stenosis, lumbar region without neurogenic claudication: Secondary | ICD-10-CM | POA: Diagnosis not present

## 2022-02-17 DIAGNOSIS — M5136 Other intervertebral disc degeneration, lumbar region: Secondary | ICD-10-CM

## 2022-02-17 NOTE — Patient Instructions (Addendum)
Good to see you  ?Epidural injection  ?Tylenol as needed for day to day as needed ?Can use the remainder of meloxicam as needed  ?Herniated disc HEP  ?Follow up 2 weeks after epidural to discuss results  ? ?

## 2022-02-18 ENCOUNTER — Telehealth: Payer: Self-pay | Admitting: Sports Medicine

## 2022-02-18 ENCOUNTER — Other Ambulatory Visit: Payer: Self-pay | Admitting: Sports Medicine

## 2022-02-18 DIAGNOSIS — M5116 Intervertebral disc disorders with radiculopathy, lumbar region: Secondary | ICD-10-CM

## 2022-02-18 DIAGNOSIS — M5136 Other intervertebral disc degeneration, lumbar region: Secondary | ICD-10-CM

## 2022-02-18 DIAGNOSIS — M48061 Spinal stenosis, lumbar region without neurogenic claudication: Secondary | ICD-10-CM

## 2022-02-18 NOTE — Telephone Encounter (Signed)
-----   Message from Glennon Mac, DO sent at 02/18/2022  9:43 AM EDT ----- ?Patient is unable to be scheduled for lumbar epidural at [facility] imaging due to being over table limit.  Please refer patient to Dr. Gorden Harms at [facility] spine and pain.  She can discuss with him epidural versus alternative interventions.  Please make patient aware of the change in plans.  Thank you. ?----- Message ----- ?From: Cristopher Peru ?Sent: 02/18/2022   9:40 AM EDT ?To: Glennon Mac, DO ? ?I am unable to schedule patient for Lumbar Epidural. Patient is over our table limit.  ? ?Thank You, ?Cathy C ? ? ? ?

## 2022-02-18 NOTE — Telephone Encounter (Signed)
Pt was referred to Dr. Gorden Harms at [facility] spine and pain she was called and notified of the new referral  ? ?

## 2022-02-18 NOTE — Telephone Encounter (Signed)
Pt was informed that she is over the table weight limit for an epidural at [facility] and they cannot provide this service for her. ?

## 2022-02-18 NOTE — Telephone Encounter (Signed)
-----   Message from Glennon Mac, DO sent at 02/18/2022  9:43 AM EDT ----- ?Patient is unable to be scheduled for lumbar epidural at [facility] imaging due to being over table limit.  Please refer patient to Dr. Gorden Harms at [facility] spine and pain.  She can discuss with him epidural versus alternative interventions.  Please make patient aware of the change in plans.  Thank you. ?----- Message ----- ?From: Cristopher Peru ?Sent: 02/18/2022   9:40 AM EDT ?To: Glennon Mac, DO ? ?I am unable to schedule patient for Lumbar Epidural. Patient is over our table limit.  ? ?Thank You, ?Cathy C ? ? ? ?

## 2022-02-22 ENCOUNTER — Other Ambulatory Visit (HOSPITAL_COMMUNITY): Payer: Self-pay

## 2022-02-22 MED FILL — Levothyroxine Sodium Tab 150 MCG: ORAL | 90 days supply | Qty: 90 | Fill #0 | Status: AC

## 2022-02-23 ENCOUNTER — Encounter (INDEPENDENT_AMBULATORY_CARE_PROVIDER_SITE_OTHER): Payer: Self-pay | Admitting: Family Medicine

## 2022-02-23 ENCOUNTER — Ambulatory Visit (INDEPENDENT_AMBULATORY_CARE_PROVIDER_SITE_OTHER): Payer: BC Managed Care – PPO | Admitting: Psychology

## 2022-02-23 DIAGNOSIS — F331 Major depressive disorder, recurrent, moderate: Secondary | ICD-10-CM | POA: Diagnosis not present

## 2022-02-23 NOTE — Progress Notes (Signed)
[place] Counselor/Therapist Progress Note ? ?Patient ID: Alexandra Beck, MRN: [id number],   ? ?Date: 02/23/2022 ? ?Time Spent: 60 minutes ? ?Treatment Type: Individual Therapy ? ?Reported Symptoms: sadness,  ? ?Mental Status Exam: ?Appearance:  Casual     ?Behavior: Appropriate  ?Motor: Normal  ?Speech/Language:  Normal Rate  ?Affect: Appropriate  ?Mood: Pleasant  ?Thought process: normal  ?Thought content:   WNL  ?Sensory/Perceptual disturbances:   WNL  ?Orientation: oriented to person, place, time/date, and situation  ?Attention: Good  ?Concentration: Good  ?Memory: WNL  ?Fund of knowledge:  Good  ?Insight:   Good  ?Judgment:  Good  ?Impulse Control: Good  ? ?Risk Assessment: ?Danger to Self:  No ?Self-injurious Behavior: No ?Danger to Others: No ?Duty to Warn:no ?Physical Aggression / Violence:No  ?Access to Firearms a concern: No  ?Gang Involvement:No  ? ?Subjective: The patient attended a face-to-face individual therapy session today via video visit due to COVID-19.  The patient gave verbal consent for the video visit to be on WebEx.  The patient was in her home alone and the therapist was in the office.  The patient presents as pleasant and cooperative.  The patient reports that things have been going smoothly with she and [name].  We talked about how she feels like she has been doing and she feels like she has been doing better since beginning therapy.  The patient talked about doing a stream on the Internet and really seemed to enjoy this conversation.  When looking at how far she has come she is not presenting as much as depressed anymore.  She is crying less and being less negative about herself.  We will continue to do therapy and work on EMDR as needed.   ? ?Interventions: Cognitive Behavioral Therapy, Assertiveness/Communication, and Problem solving ? ?Diagnosis:Major depressive disorder, recurrent episode, moderate ([place]) ? ?Plan: Treatment Plan ? ?Strengths/Abilities:  Intelligent,  ability for insight, supportive wife ? ?Treatment Preferences:  Outpatient Individual therapy ? ?Statement of Needs:  "I Need help with my depression and motivation. " ? ?Symptoms:Depressed or irritable mood.:(Status: improved). Diminished interest in or  ?enjoyment of activities.(Status: improved). Feelings of hopelessness,  ?worthlessness, or inappropriate guilt.: (Status: improved). History of chronic or ?recurrent depression for which the client has taken antidepressant medication, been hospitalized, had  ?outpatient treatment, or had a course of electroconvulsive therapy.(Status:  ?maintained). Lack of energy.: (Status: maintained). Low self-esteem.:  (Status: maintained). Poor concentration and indecisiveness.:  (Status: improved). Social withdrawal.:  (Status: maintained).  ?Unresolved grief issues.: (Status: maintained). ? ?Problems Addressed:  Unipolar Depression ? ?Goals:  ?LTG:1. Alleviate depressive symptoms and return to previous level of effective  ?functioning.  40% ?2. Appropriately grieve the loss in order to normalize mood and to return  ?to previously adaptive level of functioning. ?3. Develop healthy interpersonal relationships that lead to the alleviation  ?and help prevent the relapse of depression.  40% ?4. Develop healthy thinking patterns and beliefs about self, others, and the ?world that lead to the alleviation and help prevent the relapse of  ?Depression  40% ?STG:1.Describe current and past experiences with depression including their impact on functioning and  ?attempts to resolve it.  50 % ?2. Identify and replace thoughts and beliefs that support depression.  30 % ?3.Learn and implement behavioral strategies to overcome depression. 30% ?4.Verbalize an understanding of healthy and unhealthy emotions with the intent of increasing the use of  ?healthy emotions to guide actions.  20 % ? ?Target Date:  10/07/2022 ?Frequency:  Weekly ?Modality:Individual ?Interventions by Therapist:  CBT,  problem solving, EMDR, insight oriented ? ?Patient approved Treatment Plan ? ?Marybell Robards G Marcas Bowsher, LCSW ? ? ? ? ? ? ? ? ? ? ? ? ? ? ? ? ? ? ? ? ? ? ? ? ? ? ? ?Glen Blatchley G Dunbar Buras, LCSW ? ? ? ? ? ? ? ? ? ? ? ? ? ? ?Grisel Blumenstock G Visente Kirker, LCSW ? ? ? ? ? ? ? ? ? ? ? ? ? ? ?Lily Kernen G Melysa Schroyer, LCSW ? ? ? ? ? ? ? ? ? ? ? ? ? ? ?Mylia Pondexter G Darrel Gloss, LCSW ? ? ? ? ? ? ? ? ? ? ? ? ? ? ?Caylon Saine G Danile Trier, LCSW ? ? ? ? ? ? ? ? ? ? ? ? ? ? ?Clancy Mullarkey G Braelee Herrle, LCSW ? ? ? ? ? ? ? ? ? ? ? ? ? ? ?Cristianna Cyr G Milda Lindvall, LCSW ? ? ? ? ? ? ? ? ? ? ? ? ? ? ?Zadin Lange G Jaymarion Trombly, LCSW ? ? ? ? ? ? ? ? ? ? ? ? ? ? ?Ellicia Alix G Alecea Trego, LCSW ? ? ? ? ? ? ? ? ? ? ? ? ? ? ?Ricarda Atayde G Bailei Buist, LCSW ?

## 2022-02-24 ENCOUNTER — Encounter (INDEPENDENT_AMBULATORY_CARE_PROVIDER_SITE_OTHER): Payer: Self-pay

## 2022-02-24 ENCOUNTER — Other Ambulatory Visit (HOSPITAL_COMMUNITY): Payer: Self-pay

## 2022-02-24 NOTE — Telephone Encounter (Signed)
I just sent patient a message in mychart. PA denied for [facility]. Patient already uses copay card. ?

## 2022-02-24 NOTE — Progress Notes (Deleted)
?TeleHealth Visit:  ?Due to the COVID-19 pandemic, this visit was completed with telemedicine (audio/video) technology to reduce patient and provider exposure as well as to preserve personal protective equipment.  ? ?Alexandra Beck has verbally consented to this TeleHealth visit. The patient is located at home, the provider is located at home. The participants in this visit include the listed provider and patient. The visit was conducted today via MyChart video. ? ?OBESITY ?Alexandra Beck is here to discuss her progress with her obesity treatment plan along with follow-up of her obesity related diagnoses.  ? ?Today's visit was # 23 ?Starting weight: 468 lbs ?Starting date: 04/17/2018 ?Total weight loss: 30 lbs at last in office visit (02/14/22) ?Weight at last in office visit: 438 lbs ?Today's reported weight: *** lbs No weight reported. ? ?Nutrition Plan: the Category 4 Plan.  ?Hunger is {EWCONTROLASSESSMENT:24261}. Cravings are {EWCONTROLASSESSMENT:24261}.  ?Current exercise: {exercise types:16438} ? ?Interim History: *** ? ?Assessment/Plan:  ?*** ?Obesity: Current BMI *** ?Alexandra Beck {CHL AMB IS/IS NOT:210130109} currently in the action stage of change. As such, her goal is to {MWMwtloss#1:210800005}. She has agreed to {MWMwtlossportion/plan2:23431}.  ? ?Exercise goals: {MWM EXERCISE RECS:23473} ?Continue exercise: {exercise types:16438} ? ?Behavioral modification strategies: {MWMwtlossdietstrategies3:23432}. ? ?Alexandra Beck has agreed to follow-up with our clinic in {NUMBER 1-10:22536} weeks.  ? ?No orders of the defined types were placed in this encounter. ? ? ?There are no discontinued medications.  ? ?No orders of the defined types were placed in this encounter. ?   ? ?Objective:  ? ?VITALS: Per patient if applicable, see vitals. ?GENERAL: Alert and in no acute distress. ?CARDIOPULMONARY: No increased WOB. Speaking in clear sentences.  ?PSYCH: Pleasant and cooperative. Speech normal rate and rhythm. Affect is appropriate. Insight  and judgement are appropriate. Attention is focused, linear, and appropriate.  ?NEURO: Oriented as arrived to appointment on time with no prompting.  ? ?Lab Results  ?Component Value Date  ? CREATININE 1.12 (H) 09/17/2021  ? BUN 20 09/17/2021  ? NA 136 09/17/2021  ? K 3.7 09/17/2021  ? CL 96 (L) 09/17/2021  ? CO2 27 09/17/2021  ? ?Lab Results  ?Component Value Date  ? ALT 17 09/14/2021  ? AST 16 09/14/2021  ? ALKPHOS 89 09/14/2021  ? BILITOT 0.2 09/14/2021  ? ?Lab Results  ?Component Value Date  ? HGBA1C 5.9 (H) 11/11/2020  ? HGBA1C 5.6 12/17/2019  ? HGBA1C 5.6 07/04/2019  ? HGBA1C 5.4 12/04/2018  ? HGBA1C 5.8 (H) 07/25/2018  ? ?Lab Results  ?Component Value Date  ? INSULIN 23.6 11/11/2020  ? INSULIN 17.1 12/17/2019  ? INSULIN 23.5 07/04/2019  ? INSULIN 25.8 (H) 12/04/2018  ? INSULIN 24.7 07/25/2018  ? ?Lab Results  ?Component Value Date  ? TSH 2.94 01/08/2021  ? ?Lab Results  ?Component Value Date  ? CHOL 185 11/11/2020  ? HDL 53 11/11/2020  ? LDLCALC 103 (H) 11/11/2020  ? LDLDIRECT 141.0 03/02/2016  ? TRIG 169 (H) 11/11/2020  ? CHOLHDL 4 03/23/2018  ? ?Lab Results  ?Component Value Date  ? WBC 7.4 09/17/2021  ? HGB 11.3 (L) 09/17/2021  ? HCT 38.5 09/17/2021  ? MCV 89.1 09/17/2021  ? PLT 266 09/17/2021  ? ?No results found for: IRON, TIBC, FERRITIN ?Lab Results  ?Component Value Date  ? VD25OH 30.2 11/11/2020  ? VD25OH 38.2 12/17/2019  ? VD25OH 34.6 07/04/2019  ? ? ?Attestation Statements:  ? ?Reviewed by clinician on day of visit: allergies, medications, problem list, medical history, surgical history, family history, social history, and previous encounter  notes. ? ?***(delete if time-based billing not used)Time spent on visit including pre-visit chart review and post-visit charting and care was *** minutes.  ? ? ?

## 2022-02-24 NOTE — Telephone Encounter (Signed)
Please check PA, Thanks

## 2022-02-28 ENCOUNTER — Telehealth (INDEPENDENT_AMBULATORY_CARE_PROVIDER_SITE_OTHER): Payer: BC Managed Care – PPO | Admitting: Family Medicine

## 2022-02-28 ENCOUNTER — Encounter (INDEPENDENT_AMBULATORY_CARE_PROVIDER_SITE_OTHER): Payer: Self-pay | Admitting: Family Medicine

## 2022-02-28 ENCOUNTER — Encounter (INDEPENDENT_AMBULATORY_CARE_PROVIDER_SITE_OTHER): Payer: Self-pay

## 2022-02-28 DIAGNOSIS — Z7984 Long term (current) use of oral hypoglycemic drugs: Secondary | ICD-10-CM

## 2022-02-28 DIAGNOSIS — F3289 Other specified depressive episodes: Secondary | ICD-10-CM | POA: Diagnosis not present

## 2022-02-28 DIAGNOSIS — E1165 Type 2 diabetes mellitus with hyperglycemia: Secondary | ICD-10-CM

## 2022-02-28 DIAGNOSIS — E669 Obesity, unspecified: Secondary | ICD-10-CM | POA: Diagnosis not present

## 2022-02-28 DIAGNOSIS — Z6841 Body Mass Index (BMI) 40.0 and over, adult: Secondary | ICD-10-CM | POA: Diagnosis not present

## 2022-02-28 NOTE — Progress Notes (Signed)
?TeleHealth Visit:  ?Due to the COVID-19 pandemic, this visit was completed with telemedicine (audio/video) technology to reduce patient and provider exposure as well as to preserve personal protective equipment.  ? ?Alexandra Beck has verbally consented to this TeleHealth visit. The patient is located at home, the provider is located at home. The participants in this visit include the listed provider and patient. The visit was conducted today via MyChart video. ? ?OBESITY ?Alexandra Beck is here to discuss her progress with her obesity treatment plan along with follow-up of her obesity related diagnoses.  ? ?Today's visit was # 25 ?Starting weight: 468 lbs ?Starting date: 04/17/2018 ?Total weight loss: 30 lbs at last in office visit. ?Weight at last in office visit: 438 lbs ?Today's reported weight:  No weight reported. ? ?Nutrition Plan: the Category 4 Plan.  ?Hunger is poorly controlled. Cravings are poorly controlled.  ?Current exercise: none ? ?Interim History: Alexandra Beck and notes increased hunger and cravings since stopping the [facility] a week ago due to inability to obtain it. ?She admits to having large portions sometimes.  She skips breakfast often.  She has both lunch and dinner at work.  She works from 12 to 9 PM.  Occasionally she will have a [employer] protein shake for breakfast. ?Her wife Alexandra Beck is also a patient.  Alexandra Beck packs her food for her every day. ? ?Assessment/Plan:  ?1. Type II Diabetes ?HgbA1c is at goal. ?Current monitoring regimen: none ?Any episodes of hypoglycemia? no ?Medication(s): Metformin 500 mg daily. ?She is prescribed Mounjaro but was unable to obtain due to lack of coverage per pharmacy.  She has been off of this a week. ? ?Lab Results  ?Component Value Date  ? HGBA1C 5.9 (H) 11/11/2020  ? HGBA1C 5.6 12/17/2019  ? HGBA1C 5.6 07/04/2019  ? ?Lab Results  ?Component Value Date  ? MICROALBUR 8.05 (H) 06/26/2007  ? LDLCALC 103 (H) 11/11/2020  ? CREATININE 1.12 (H) 09/17/2021  ? ? ?Plan: ?Continue  metformin. ?Spoke with our prior authorization coordinator.  She advised to have patient to check other pharmacies her current pharmacy will not honor the coupon.  I advised the patient of this.  Link for coupon sent to patient.  Advised her to try Alexandra Beck if unable to get the medication through [facility]. ?If unable to obtain Alexandra Beck then we can switch her back to Ozempic. ? ?2.  Other depression with emotional eating ?Alexandra Beck was started on Rexulti about a month ago by her PMHNP.  Bupropion was discontinued because Alexandra Beck thought it was causing nightmares.  However she says she is still having the nightmares.  She has noticed a significant increase in cravings since stopping the bupropion.  She is currently on sertraline 100 mg along with the Rexulti.  She notes she is feeling more positive and not weepy or apathetic over the past few weeks.  Denies suicidal ideation. ?She currently sees her counselor weekly and her mental health provider monthly. ? ?Plan: ?She will follow-up with her counselor and mental health provider as directed. ?Continue sertraline and Rexulti as directed. ?I think once we get her started back on Mounjaro she will likely experience less cravings. ? ? ?3. Obesity: Current BMI 79.03 ?[name] is currently in the action stage of change. As such, her goal is to continue with weight loss efforts. She has agreed to the Category 4 Plan.  ? ?Exercise goals: No exercise has been prescribed at this time. ? ? ?Behavioral modification strategies: increasing lean protein intake, no  skipping meals, and meal planning and cooking strategies. ? ?[name] has agreed to follow-up with our clinic in 2 weeks.  ? ?No orders of the defined types were placed in this encounter. ? ? ?There are no discontinued medications.  ? ?No orders of the defined types were placed in this encounter. ?   ? ?Objective:  ? ?VITALS: Per patient if applicable, see vitals. ?GENERAL: Alert and in no acute  distress. ?CARDIOPULMONARY: No increased WOB. Speaking in clear sentences.  ?PSYCH: Pleasant and cooperative. Speech normal rate and rhythm. Affect is appropriate. Insight and judgement are appropriate. Attention is focused, linear, and appropriate.  ?NEURO: Oriented as arrived to appointment on time with no prompting.  ? ?Lab Results  ?Component Value Date  ? CREATININE 1.12 (H) 09/17/2021  ? BUN 20 09/17/2021  ? NA 136 09/17/2021  ? K 3.7 09/17/2021  ? CL 96 (L) 09/17/2021  ? CO2 27 09/17/2021  ? ?Lab Results  ?Component Value Date  ? ALT 17 09/14/2021  ? AST 16 09/14/2021  ? ALKPHOS 89 09/14/2021  ? BILITOT 0.2 09/14/2021  ? ?Lab Results  ?Component Value Date  ? HGBA1C 5.9 (H) 11/11/2020  ? HGBA1C 5.6 12/17/2019  ? HGBA1C 5.6 07/04/2019  ? HGBA1C 5.4 12/04/2018  ? HGBA1C 5.8 (H) 07/25/2018  ? ?Lab Results  ?Component Value Date  ? INSULIN 23.6 11/11/2020  ? INSULIN 17.1 12/17/2019  ? INSULIN 23.5 07/04/2019  ? INSULIN 25.8 (H) 12/04/2018  ? INSULIN 24.7 07/25/2018  ? ?Lab Results  ?Component Value Date  ? TSH 2.94 01/08/2021  ? ?Lab Results  ?Component Value Date  ? CHOL 185 11/11/2020  ? HDL 53 11/11/2020  ? LDLCALC 103 (H) 11/11/2020  ? LDLDIRECT 141.0 03/02/2016  ? TRIG 169 (H) 11/11/2020  ? CHOLHDL 4 03/23/2018  ? ?Lab Results  ?Component Value Date  ? WBC 7.4 09/17/2021  ? HGB 11.3 (L) 09/17/2021  ? HCT 38.5 09/17/2021  ? MCV 89.1 09/17/2021  ? PLT 266 09/17/2021  ? ?No results found for: IRON, TIBC, FERRITIN ?Lab Results  ?Component Value Date  ? VD25OH 30.2 11/11/2020  ? VD25OH 38.2 12/17/2019  ? VD25OH 34.6 07/04/2019  ? ? ?Attestation Statements:  ? ?Reviewed by clinician on day of visit: allergies, medications, problem list, medical history, surgical history, family history, social history, and previous encounter notes. ? ?Time spent on visit including pre-visit chart review and post-visit charting and care was 40 minutes.  ? ? ?

## 2022-03-02 ENCOUNTER — Ambulatory Visit (INDEPENDENT_AMBULATORY_CARE_PROVIDER_SITE_OTHER): Payer: BC Managed Care – PPO | Admitting: Psychology

## 2022-03-02 DIAGNOSIS — F428 Other obsessive-compulsive disorder: Secondary | ICD-10-CM | POA: Diagnosis not present

## 2022-03-02 DIAGNOSIS — F331 Major depressive disorder, recurrent, moderate: Secondary | ICD-10-CM

## 2022-03-02 NOTE — Progress Notes (Signed)
[place] Counselor/Therapist Progress Note ? ?Patient ID: Alexandra Beck, MRN: [id number],   ? ?Date: 03/02/2022 ? ?Time Spent: 45 minutes ? ?Treatment Type: Individual Therapy ? ?Reported Symptoms: sadness,  ? ?Mental Status Exam: ?Appearance:  Casual     ?Behavior: Appropriate  ?Motor: Normal  ?Speech/Language:  Normal Rate  ?Affect: Appropriate  ?Mood: Pleasant  ?Thought process: normal  ?Thought content:   WNL  ?Sensory/Perceptual disturbances:   WNL  ?Orientation: oriented to person, place, time/date, and situation  ?Attention: Good  ?Concentration: Good  ?Memory: WNL  ?Fund of knowledge:  Good  ?Insight:   Good  ?Judgment:  Good  ?Impulse Control: Good  ? ?Risk Assessment: ?Danger to Self:  No ?Self-injurious Behavior: No ?Danger to Others: No ?Duty to Warn:no ?Physical Aggression / Violence:No  ?Access to Firearms a concern: No  ?Gang Involvement:No  ? ?Subjective: The patient attended a face-to-face individual therapy session today via video visit due to COVID-19.  The patient gave verbal consent for the video visit to be on WebEx.  The patient was in her home alone and the therapist was in the office.  The patient was late getting on to the session because she overslept.  The patient was very apologetic for being late to the session.  We talked a little bit more today about the need for her to get on some sort of schedule or routine.  She has difficulty following through on what she needs to follow through on and then she gets negative with herself because she has not done what she needs to do.  I explained to her how routines and schedules sometimes help with those kinds of things.  The patient is going to work this week on trying to get herself a better sleep schedule so that she does not struggle as much with getting up. ? ?Interventions: Cognitive Behavioral Therapy, Assertiveness/Communication, and Problem solving ? ?Diagnosis:Major depressive disorder, recurrent episode, moderate  ([place]) ? ?Obsessional thoughts ? ?Plan: Treatment Plan ? ?Strengths/Abilities:  Intelligent, ability for insight, supportive wife ? ?Treatment Preferences:  Outpatient Individual therapy ? ?Statement of Needs:  "I Need help with my depression and motivation. " ? ?Symptoms:Depressed or irritable mood.:(Status: improved). Diminished interest in or  ?enjoyment of activities.(Status: improved). Feelings of hopelessness,  ?worthlessness, or inappropriate guilt.: (Status: improved). History of chronic or ?recurrent depression for which the client has taken antidepressant medication, been hospitalized, had  ?outpatient treatment, or had a course of electroconvulsive therapy.(Status:  ?maintained). Lack of energy.: (Status: maintained). Low self-esteem.:  (Status: maintained). Poor concentration and indecisiveness.:  (Status: improved). Social withdrawal.:  (Status: maintained).  ?Unresolved grief issues.: (Status: maintained). ? ?Problems Addressed:  Unipolar Depression ? ?Goals:  ?LTG:1. Alleviate depressive symptoms and return to previous level of effective  ?functioning.  40% ?2. Appropriately grieve the loss in order to normalize mood and to return  ?to previously adaptive level of functioning. ?3. Develop healthy interpersonal relationships that lead to the alleviation  ?and help prevent the relapse of depression.  40% ?4. Develop healthy thinking patterns and beliefs about self, others, and the ?world that lead to the alleviation and help prevent the relapse of  ?Depression  40% ?STG:1.Describe current and past experiences with depression including their impact on functioning and  ?attempts to resolve it.  50 % ?2. Identify and replace thoughts and beliefs that support depression.  30 % ?3.Learn and implement behavioral strategies to overcome depression. 30% ?4.Verbalize an understanding of healthy and unhealthy emotions with the intent  of increasing the use of  ?healthy emotions to guide actions.  20 % ? ?Target  Date:  10/07/2022 ?Frequency:  Weekly ?Modality:Individual ?Interventions by Therapist:  CBT, problem solving, EMDR, insight oriented ? ?Patient approved Treatment Plan ? ?Lova Urbieta G Daymen Hassebrock, LCSW ? ? ? ? ? ? ? ? ? ? ? ? ? ? ? ? ? ? ? ? ? ? ? ? ? ? ? ?Koralyn Prestage G Srihitha Tagliaferri, LCSW ? ? ? ? ? ? ? ? ? ? ? ? ? ? ?Sharisa Toves G Ezabella Teska, LCSW ? ? ? ? ? ? ? ? ? ? ? ? ? ? ?Alani Lacivita G Leza Apsey, LCSW ? ? ? ? ? ? ? ? ? ? ? ? ? ? ?Ginnifer Creelman G Tanyiah Laurich, LCSW ? ? ? ? ? ? ? ? ? ? ? ? ? ? ?Ambria Mayfield G Jaidon Ellery, LCSW ? ? ? ? ? ? ? ? ? ? ? ? ? ? ?Shiven Junious G Modean Mccullum, LCSW ? ? ? ? ? ? ? ? ? ? ? ? ? ? ?Mattheo Swindle G Ivori Storr, LCSW ? ? ? ? ? ? ? ? ? ? ? ? ? ? ?Danitza Schoenfeldt G Keyler Hoge, LCSW ? ? ? ? ? ? ? ? ? ? ? ? ? ? ?Mikayla Chiusano G Meaghann Choo, LCSW ? ? ? ? ? ? ? ? ? ? ? ? ? ? ?Kayci Belleville G Mikalyn Hermida, LCSW ? ? ? ? ? ? ? ? ? ? ? ? ? ? ?Mary-Ann Pennella G Golden Emile, LCSW ?

## 2022-03-04 ENCOUNTER — Other Ambulatory Visit (HOSPITAL_COMMUNITY): Payer: Self-pay

## 2022-03-04 ENCOUNTER — Other Ambulatory Visit: Payer: Self-pay | Admitting: Family

## 2022-03-04 ENCOUNTER — Other Ambulatory Visit: Payer: Self-pay | Admitting: Pulmonary Disease

## 2022-03-07 ENCOUNTER — Other Ambulatory Visit (HOSPITAL_COMMUNITY): Payer: Self-pay

## 2022-03-07 MED ORDER — METFORMIN HCL 500 MG PO TABS
ORAL_TABLET | ORAL | 3 refills | Status: DC
  Filled 2022-03-07: qty 90, 90d supply, fill #0
  Filled 2022-06-03: qty 90, 90d supply, fill #1

## 2022-03-07 MED ORDER — MODAFINIL 100 MG PO TABS
100.0000 mg | ORAL_TABLET | Freq: Every day | ORAL | 1 refills | Status: DC
  Filled 2022-03-07: qty 60, 60d supply, fill #0

## 2022-03-07 MED ORDER — METOPROLOL SUCCINATE ER 50 MG PO TB24
ORAL_TABLET | ORAL | 3 refills | Status: DC
  Filled 2022-03-07: qty 90, 90d supply, fill #0
  Filled 2022-06-03: qty 90, 90d supply, fill #1

## 2022-03-07 MED ORDER — ROSUVASTATIN CALCIUM 10 MG PO TABS
ORAL_TABLET | ORAL | 3 refills | Status: DC
  Filled 2022-03-07: qty 90, 90d supply, fill #0
  Filled 2022-06-03: qty 90, 90d supply, fill #1

## 2022-03-07 NOTE — Telephone Encounter (Signed)
Dr. Elsworth Soho, please advise on refill ?

## 2022-03-07 NOTE — Telephone Encounter (Signed)
Refill sent.

## 2022-03-08 ENCOUNTER — Other Ambulatory Visit (HOSPITAL_COMMUNITY): Payer: Self-pay

## 2022-03-09 ENCOUNTER — Ambulatory Visit (INDEPENDENT_AMBULATORY_CARE_PROVIDER_SITE_OTHER): Payer: BC Managed Care – PPO | Admitting: Psychology

## 2022-03-09 DIAGNOSIS — F428 Other obsessive-compulsive disorder: Secondary | ICD-10-CM

## 2022-03-09 DIAGNOSIS — F331 Major depressive disorder, recurrent, moderate: Secondary | ICD-10-CM

## 2022-03-10 NOTE — Progress Notes (Signed)
[facility] Counselor/Therapist Progress Note ? ?Patient ID: Alexandra Beck, MRN: [id number],   ? ?Date: 03/09/2022 ? ?Time Spent: 60 minutes ? ?Treatment Type: Individual Therapy ? ?Reported Symptoms: sadness,  ? ?Mental Status Exam: ?Appearance:  Casual     ?Behavior: Appropriate  ?Motor: Normal  ?Speech/Language:  Normal Rate  ?Affect: Appropriate  ?Mood: Pleasant  ?Thought process: normal  ?Thought content:   WNL  ?Sensory/Perceptual disturbances:   WNL  ?Orientation: oriented to person, place, time/date, and situation  ?Attention: Good  ?Concentration: Good  ?Memory: WNL  ?Fund of knowledge:  Good  ?Insight:   Good  ?Judgment:  Good  ?Impulse Control: Good  ? ?Risk Assessment: ?Danger to Self:  No ?Self-injurious Behavior: No ?Danger to Others: No ?Duty to Warn:no ?Physical Aggression / Violence:No  ?Access to Firearms a concern: No  ?Gang Involvement:No  ? ?Subjective: The patient attended a face-to-face individual therapy session today via video visit due to COVID-19.  The patient gave verbal consent for the video visit to be on WebEx.  The patient was in her home alone and the therapist was in the office.  The patient reports that things are going well.  She and her partner are getting along much better than they have been in the past.  Today we decided to do some EMDR.  We targeted the criticism that she had when she was younger by her father.  The negative cognition that we focused on was I am not good enough.  We installed the cognition of my sensitivity is not superpower.  The patient is supposed to notice between now and the next session if she feels different. ? ?Interventions: Cognitive Behavioral Therapy, Assertiveness/Communication, and Problem solving ? ?Diagnosis:Major depressive disorder, recurrent episode, moderate ([place]) ? ?Obsessional thoughts ? ?Plan: Treatment Plan ? ?Strengths/Abilities:  Intelligent, ability for insight, supportive wife ? ?Treatment Preferences:  Outpatient  Individual therapy ? ?Statement of Needs:  "I Need help with my depression and motivation. " ? ?Symptoms:Depressed or irritable mood.:(Status: improved). Diminished interest in or  ?enjoyment of activities.(Status: improved). Feelings of hopelessness,  ?worthlessness, or inappropriate guilt.: (Status: improved). History of chronic or ?recurrent depression for which the client has taken antidepressant medication, been hospitalized, had  ?outpatient treatment, or had a course of electroconvulsive therapy.(Status:  ?maintained). Lack of energy.: (Status: maintained). Low self-esteem.:  (Status: maintained). Poor concentration and indecisiveness.:  (Status: improved). Social withdrawal.:  (Status: maintained).  ?Unresolved grief issues.: (Status: maintained). ? ?Problems Addressed:  Unipolar Depression ? ?Goals:  ?LTG:1. Alleviate depressive symptoms and return to previous level of effective  ?functioning.  40% ?2. Appropriately grieve the loss in order to normalize mood and to return  ?to previously adaptive level of functioning. ?3. Develop healthy interpersonal relationships that lead to the alleviation  ?and help prevent the relapse of depression.  40% ?4. Develop healthy thinking patterns and beliefs about self, others, and the ?world that lead to the alleviation and help prevent the relapse of  ?Depression  40% ?STG:1.Describe current and past experiences with depression including their impact on functioning and  ?attempts to resolve it.  50 % ?2. Identify and replace thoughts and beliefs that support depression.  30 % ?3.Learn and implement behavioral strategies to overcome depression. 30% ?4.Verbalize an understanding of healthy and unhealthy emotions with the intent of increasing the use of  ?healthy emotions to guide actions.  20 % ? ?Target Date:  10/07/2022 ?Frequency:  Weekly ?Modality:Individual ?Interventions by Therapist:  CBT, problem solving, EMDR, insight  oriented ? ?Patient approved Treatment  Plan ? ?Abilene Mcphee G Alizia Greif, LCSW ? ? ? ? ? ? ? ? ? ? ? ? ? ? ? ? ? ? ? ? ? ? ? ? ? ? ? ?Dazaria Macneill G Ysela Hettinger, LCSW ? ? ? ? ? ? ? ? ? ? ? ? ? ? ?Sladen Plancarte G Juliza Machnik, LCSW ? ? ? ? ? ? ? ? ? ? ? ? ? ? ?Teondre Jarosz G [name] Arps, LCSW ? ? ? ? ? ? ? ? ? ? ? ? ? ? ?Shaughnessy Gethers G Oden Lindaman, LCSW ? ? ? ? ? ? ? ? ? ? ? ? ? ? ?Enes Rokosz G Attikus Bartoszek, LCSW ? ? ? ? ? ? ? ? ? ? ? ? ? ? ?Madoline Bhatt G Fronia Depass, LCSW ? ? ? ? ? ? ? ? ? ? ? ? ? ? ?Fransico Sciandra G Lukas Pelcher, LCSW ? ? ? ? ? ? ? ? ? ? ? ? ? ? ?Anne-Marie Genson G Ceonna Frazzini, LCSW ? ? ? ? ? ? ? ? ? ? ? ? ? ? ?Trace Wirick G Arman Loy, LCSW ? ? ? ? ? ? ? ? ? ? ? ? ? ? ?Bayard More G Laurette Villescas, LCSW ? ? ? ? ? ? ? ? ? ? ? ? ? ? ?Vir Whetstine G Landyn Lorincz, LCSW ? ? ? ? ? ? ? ? ? ? ? ? ? ? ?Porcia Morganti G Carleen Rhue, LCSW ?

## 2022-03-11 ENCOUNTER — Other Ambulatory Visit (HOSPITAL_COMMUNITY): Payer: Self-pay

## 2022-03-12 ENCOUNTER — Other Ambulatory Visit (HOSPITAL_COMMUNITY): Payer: Self-pay

## 2022-03-14 ENCOUNTER — Encounter (INDEPENDENT_AMBULATORY_CARE_PROVIDER_SITE_OTHER): Payer: Self-pay | Admitting: Family Medicine

## 2022-03-14 ENCOUNTER — Ambulatory Visit (INDEPENDENT_AMBULATORY_CARE_PROVIDER_SITE_OTHER): Payer: BC Managed Care – PPO | Admitting: Family Medicine

## 2022-03-14 ENCOUNTER — Other Ambulatory Visit (HOSPITAL_COMMUNITY): Payer: Self-pay

## 2022-03-14 VITALS — BP 130/82 | Temp 98.6°F | Ht 63.0 in | Wt >= 6400 oz

## 2022-03-14 DIAGNOSIS — Z6841 Body Mass Index (BMI) 40.0 and over, adult: Secondary | ICD-10-CM | POA: Diagnosis not present

## 2022-03-14 DIAGNOSIS — E669 Obesity, unspecified: Secondary | ICD-10-CM

## 2022-03-14 DIAGNOSIS — E1165 Type 2 diabetes mellitus with hyperglycemia: Secondary | ICD-10-CM

## 2022-03-14 DIAGNOSIS — I5032 Chronic diastolic (congestive) heart failure: Secondary | ICD-10-CM | POA: Diagnosis not present

## 2022-03-14 DIAGNOSIS — Z7985 Long-term (current) use of injectable non-insulin antidiabetic drugs: Secondary | ICD-10-CM

## 2022-03-14 MED ORDER — TORSEMIDE 20 MG PO TABS
ORAL_TABLET | ORAL | 0 refills | Status: DC
  Filled 2022-03-14: qty 300, 60d supply, fill #0

## 2022-03-14 MED ORDER — SPIRONOLACTONE 25 MG PO TABS
25.0000 mg | ORAL_TABLET | Freq: Every day | ORAL | 0 refills | Status: DC
  Filled 2022-03-14: qty 90, 90d supply, fill #0

## 2022-03-15 ENCOUNTER — Other Ambulatory Visit (HOSPITAL_COMMUNITY): Payer: Self-pay

## 2022-03-16 ENCOUNTER — Ambulatory Visit (INDEPENDENT_AMBULATORY_CARE_PROVIDER_SITE_OTHER): Payer: BC Managed Care – PPO | Admitting: Psychology

## 2022-03-16 DIAGNOSIS — F428 Other obsessive-compulsive disorder: Secondary | ICD-10-CM | POA: Diagnosis not present

## 2022-03-16 DIAGNOSIS — F331 Major depressive disorder, recurrent, moderate: Secondary | ICD-10-CM | POA: Diagnosis not present

## 2022-03-16 NOTE — Progress Notes (Signed)
[facility] Counselor/Therapist Progress Note ? ?Patient ID: Alexandra Beck, MRN: [id number],   ? ?Date: 03/16/2022 ? ?Time Spent: 60 minutes ? ?Treatment Type: Individual Therapy ? ?Reported Symptoms: sadness,  ? ?Mental Status Exam: ?Appearance:  Casual     ?Behavior: Appropriate  ?Motor: Normal  ?Speech/Language:  Normal Rate  ?Affect: Appropriate  ?Mood: Pleasant  ?Thought process: normal  ?Thought content:   WNL  ?Sensory/Perceptual disturbances:   WNL  ?Orientation: oriented to person, place, time/date, and situation  ?Attention: Good  ?Concentration: Good  ?Memory: WNL  ?Fund of knowledge:  Good  ?Insight:   Good  ?Judgment:  Good  ?Impulse Control: Good  ? ?Risk Assessment: ?Danger to Self:  No ?Self-injurious Behavior: No ?Danger to Others: No ?Duty to Warn:no ?Physical Aggression / Violence:No  ?Access to Firearms a concern: No  ?Gang Involvement:No  ? ?Subjective: The patient attended a face-to-face individual therapy session today via video visit due to COVID-19.  The patient gave verbal consent for the video visit to be on WebEx.  The patient was in her home alone and the therapist was in the office.   ?The patient presents as pleasant and cooperative.  The patient reports that she has noticed that she feels more positive since our last session.  We talked about a situation that she had with her online friend [name] where she actually stood up for herself.  She also did a good job of setting a limit with her brother.  In the past the patient would allow them to walk all over her and not stand up for herself, but she is doing a better job of setting some limits.  We talked about this as being a very big growth step for her and that she is not internalizing things as her fault or that someone does not like her.  Encouraged her to continue to set limits and take care of herself and she talked about possibly setting a goal for showering twice a week. ? ?Interventions: Cognitive Behavioral  Therapy, Assertiveness/Communication, and Problem solving ? ?Diagnosis:Major depressive disorder, recurrent episode, moderate ([place]) ? ?Obsessional thoughts ? ?Plan: Treatment Plan ? ?Strengths/Abilities:  Intelligent, ability for insight, supportive wife ? ?Treatment Preferences:  Outpatient Individual therapy ? ?Statement of Needs:  "I Need help with my depression and motivation. " ? ?Symptoms:Depressed or irritable mood.:(Status: improved). Diminished interest in or  ?enjoyment of activities.(Status: improved). Feelings of hopelessness,  ?worthlessness, or inappropriate guilt.: (Status: improved). History of chronic or ?recurrent depression for which the client has taken antidepressant medication, been hospitalized, had  ?outpatient treatment, or had a course of electroconvulsive therapy.(Status:  ?maintained). Lack of energy.: (Status: maintained). Low self-esteem.:  (Status: maintained). Poor concentration and indecisiveness.:  (Status: improved). Social withdrawal.:  (Status: maintained).  ?Unresolved grief issues.: (Status: maintained). ? ?Problems Addressed:  Unipolar Depression ? ?Goals:  ?LTG:1. Alleviate depressive symptoms and return to previous level of effective  ?functioning.  40% ?2. Appropriately grieve the loss in order to normalize mood and to return  ?to previously adaptive level of functioning. ?3. Develop healthy interpersonal relationships that lead to the alleviation  ?and help prevent the relapse of depression.  40% ?4. Develop healthy thinking patterns and beliefs about self, others, and the ?world that lead to the alleviation and help prevent the relapse of  ?Depression  40% ?STG:1.Describe current and past experiences with depression including their impact on functioning and  ?attempts to resolve it.  50 % ?2. Identify and replace thoughts and beliefs  that support depression.  30 % ?3.Learn and implement behavioral strategies to overcome depression. 30% ?4.Verbalize an understanding of  healthy and unhealthy emotions with the intent of increasing the use of  ?healthy emotions to guide actions.  20 % ? ?Target Date:  10/07/2022 ?Frequency:  Weekly ?Modality:Individual ?Interventions by Therapist:  CBT, problem solving, EMDR, insight oriented ? ?Patient approved Treatment Plan ? ?Masai Kidd G Kennidee Heyne, LCSW ? ? ? ? ? ? ? ? ? ? ? ? ? ? ? ? ? ? ? ? ? ? ? ? ? ? ? ?Jason Hauge G Ethelwyn Gilbertson, LCSW ? ? ? ? ? ? ? ? ? ? ? ? ? ? ?Brooklin Rieger G Kamdin Follett, LCSW ? ? ? ? ? ? ? ? ? ? ? ? ? ? ?Aleisha Paone G Winthrop Shannahan, LCSW ? ? ? ? ? ? ? ? ? ? ? ? ? ? ?Lantz Hermann G Tyrene Nader, LCSW ? ? ? ? ? ? ? ? ? ? ? ? ? ? ?Hilman Kissling G Seraj Dunnam, LCSW ? ? ? ? ? ? ? ? ? ? ? ? ? ? ?Deztinee Lohmeyer G Aly Seidenberg, LCSW ? ? ? ? ? ? ? ? ? ? ? ? ? ? ?Taler Kushner G Liyah Higham, LCSW ? ? ? ? ? ? ? ? ? ? ? ? ? ? ?Gianah Batt G Kc Sedlak, LCSW ? ? ? ? ? ? ? ? ? ? ? ? ? ? ?Laqueta Bonaventura G Shey Bartmess, LCSW ? ? ? ? ? ? ? ? ? ? ? ? ? ? ?Jaymes Revels G Dilana Mcphie, LCSW ? ? ? ? ? ? ? ? ? ? ? ? ? ? ?Otis Burress G Lyfe Monger, LCSW ? ? ? ? ? ? ? ? ? ? ? ? ? ? ?Roshell Brigham G Daquarius Dubeau, LCSW ? ? ? ? ? ? ? ? ? ? ? ? ? ? ?Scarlette Hogston G Khrystyna Schwalm, LCSW ?

## 2022-03-22 ENCOUNTER — Other Ambulatory Visit (HOSPITAL_COMMUNITY): Payer: Self-pay

## 2022-03-23 ENCOUNTER — Other Ambulatory Visit (HOSPITAL_COMMUNITY): Payer: Self-pay

## 2022-03-23 ENCOUNTER — Ambulatory Visit (INDEPENDENT_AMBULATORY_CARE_PROVIDER_SITE_OTHER): Payer: BC Managed Care – PPO | Admitting: Psychology

## 2022-03-23 DIAGNOSIS — F331 Major depressive disorder, recurrent, moderate: Secondary | ICD-10-CM | POA: Diagnosis not present

## 2022-03-23 NOTE — Progress Notes (Signed)
[facility] Counselor/Therapist Progress Note ? ?Patient ID: Alexandra Beck, MRN: [id number],   ? ?Date: 03/23/2022 ? ?Time Spent: 60 minutes ? ?Treatment Type: Individual Therapy ? ?Reported Symptoms: sadness,  ? ?Mental Status Exam: ?Appearance:  Casual     ?Behavior: Appropriate  ?Motor: Normal  ?Speech/Language:  Normal Rate  ?Affect: Appropriate  ?Mood: Pleasant  ?Thought process: normal  ?Thought content:   WNL  ?Sensory/Perceptual disturbances:   WNL  ?Orientation: oriented to person, place, time/date, and situation  ?Attention: Good  ?Concentration: Good  ?Memory: WNL  ?Fund of knowledge:  Good  ?Insight:   Good  ?Judgment:  Good  ?Impulse Control: Good  ? ?Risk Assessment: ?Danger to Self:  No ?Self-injurious Behavior: No ?Danger to Others: No ?Duty to Warn:no ?Physical Aggression / Violence:No  ?Access to Firearms a concern: No  ?Gang Involvement:No  ? ?Subjective: The patient attended a face-to-face individual therapy session today via video visit due to COVID-19.  The patient gave verbal consent for the video visit to be on WebEx.  The patient was in her home alone and the therapist was in the office.   ?The patient presents as pleasant and cooperative.  The patient reports that she and her wife are going to [facility] tomorrow to see a [employer].  The patient seems happy about doing this.  The patient talked about feeling actually happy today.  She also talked about feeling like she wants to take a shower at least twice a week now.  This is great progress for her since she has been depressed and not wanting to do any self-care prior to now.  The patient also talked about her relationship with her online friend [name] and she was very supportive in an appropriate way when he lost his pet.  She is doing a better job of setting limits for herself with everyone and I gave her positive feedback for this. ? ?Interventions: Cognitive Behavioral Therapy, Assertiveness/Communication, and  Problem solving ? ?Diagnosis:Major depressive disorder, recurrent episode, moderate ([place]) ? ?Plan: Treatment Plan ? ?Strengths/Abilities:  Intelligent, ability for insight, supportive wife ? ?Treatment Preferences:  Outpatient Individual therapy ? ?Statement of Needs:  "I Need help with my depression and motivation. " ? ?Symptoms:Depressed or irritable mood.:(Status: improved). Diminished interest in or  ?enjoyment of activities.(Status: improved). Feelings of hopelessness,  ?worthlessness, or inappropriate guilt.: (Status: improved). History of chronic or ?recurrent depression for which the client has taken antidepressant medication, been hospitalized, had  ?outpatient treatment, or had a course of electroconvulsive therapy.(Status:  ?maintained). Lack of energy.: (Status: maintained). Low self-esteem.:  (Status: maintained). Poor concentration and indecisiveness.:  (Status: improved). Social withdrawal.:  (Status: maintained).  ?Unresolved grief issues.: (Status: maintained). ? ?Problems Addressed:  Unipolar Depression ? ?Goals:  ?LTG:1. Alleviate depressive symptoms and return to previous level of effective  ?functioning.  50% ?2. Appropriately grieve the loss in order to normalize mood and to return  ?to previously adaptive level of functioning. ?3. Develop healthy interpersonal relationships that lead to the alleviation  ?and help prevent the relapse of depression.  40% ?4. Develop healthy thinking patterns and beliefs about self, others, and the ?world that lead to the alleviation and help prevent the relapse of  ?Depression  50% ?STG:1.Describe current and past experiences with depression including their impact on functioning and  ?attempts to resolve it.  50 % ?2. Identify and replace thoughts and beliefs that support depression.  40 % ?3.Learn and implement behavioral strategies to overcome depression. 40% ?4.Verbalize  an understanding of healthy and unhealthy emotions with the intent of increasing the use  of  ?healthy emotions to guide actions.  20 % ? ?Target Date:  10/07/2022 ?Frequency:  Weekly ?Modality:Individual ?Interventions by Therapist:  CBT, problem solving, EMDR, insight oriented ? ?Patient approved Treatment Plan ? ?Kassandra Meriweather G Burel Kahre, LCSW ? ? ? ? ? ? ? ? ? ? ? ? ? ? ? ? ? ? ? ? ? ? ? ? ? ? ? ?Kingston Shawgo G Brihanna Devenport, LCSW ? ? ? ? ? ? ? ? ? ? ? ? ? ? ?Lawrence Roldan G Kamber Vignola, LCSW ? ? ? ? ? ? ? ? ? ? ? ? ? ? ?Kaisei Gilbo G Ronold Hardgrove, LCSW ? ? ? ? ? ? ? ? ? ? ? ? ? ? ?Altha Sweitzer G Jocilynn Grade, LCSW ? ? ? ? ? ? ? ? ? ? ? ? ? ? ?Leeum Sankey G Sahithi Ordoyne, LCSW ? ? ? ? ? ? ? ? ? ? ? ? ? ? ?Skylen Spiering G Lamyia Cdebaca, LCSW ? ? ? ? ? ? ? ? ? ? ? ? ? ? ?Montrae Braithwaite G Jayro Mcmath, LCSW ? ? ? ? ? ? ? ? ? ? ? ? ? ? ?Olaf Mesa G Loui Massenburg, LCSW ? ? ? ? ? ? ? ? ? ? ? ? ? ? ?Levante Simones G Jessikah Dicker, LCSW ? ? ? ? ? ? ? ? ? ? ? ? ? ? ?Shira Bobst G Sajjad Honea, LCSW ? ? ? ? ? ? ? ? ? ? ? ? ? ? ?Alpa Salvo G Maryanna Stuber, LCSW ? ? ? ? ? ? ? ? ? ? ? ? ? ? ?Shiraz Bastyr G Vianny Schraeder, LCSW ? ? ? ? ? ? ? ? ? ? ? ? ? ? ?Tamani Durney G Idaly Verret, LCSW ? ? ? ? ? ? ? ? ? ? ? ? ? ? ?Agustus Mane G Lupe Bonner, LCSW ?

## 2022-03-26 NOTE — Progress Notes (Signed)
Chief Complaint:   OBESITY Alexandra Beck is here to discuss her progress with her obesity treatment plan along with follow-up of her obesity related diagnoses. Alexandra Beck is on the Category 4 Plan and states she is following her eating plan approximately 70% of the time. Alexandra Beck states she is not exercising.  Today's visit was #: 64 Starting weight: 468 lbs Starting date: 04/17/2018 Today's weight: 438 lbs Today's date: 03/14/2022 Total lbs lost to date: 30 Total lbs lost since last in-office visit: 0  Interim History: Alexandra Beck is feeling much better than she has in a long time. She is feeling it is easier to get around and her back pain is significantly improved. Alexandra Beck has gone back to eating hard boiled eggs. This week, she is going to see [employer] at [facility] and then going to [facility] to see [employer].  Subjective:   1. Chronic diastolic CHF (congestive heart failure) (HCC) Alexandra Beck is on spironolactone and torsemide. She sees the heart failure clinic.  2. Type 2 diabetes mellitus with hyperglycemia, without long-term current use of insulin ([place]) Alexandra Beck's last A1c was 5.9, but that was over 1 year ago. She is on Mounjaro and Glucophage.   Assessment/Plan:   1. Chronic diastolic CHF (congestive heart failure) (HCC) Alexandra Beck agrees to continue taking torsemide and spironolactone and agrees to follow up as directed.   - spironolactone (ALDACTONE) 25 MG tablet; Take 1 tablet (25 mg total) by mouth daily.  Dispense: 90 tablet; Refill: 0 - torsemide (DEMADEX) 20 MG tablet; Take 3 tablets (60 mg total) by mouth in the morning AND 2 tablets (40 mg total) every evening as needed  Dispense: 300 tablet; Refill: 0  2. Type 2 diabetes mellitus with hyperglycemia, without long-term current use of insulin ([place]) Labs will be obtained at the next appointment and Alexandra Beck agrees to follow up at the agreed upon time.  3. Obesity with current BMI of 77.7 Alexandra Beck is currently in the action stage of change. As such,  her goal is to continue with weight loss efforts. She has agreed to the Category 4 Plan and to add in granola the [place] brand.  Exercise goals: Continue activity as tolerated.  Behavioral modification strategies: increasing lean protein intake, meal planning and cooking strategies, keeping healthy foods in the home, and planning for success.  Alexandra Beck has agreed to follow-up with our clinic in 4 weeks. She was informed of the importance of frequent follow-up visits to maximize her success with intensive lifestyle modifications for her multiple health conditions.   Objective:   Blood pressure 130/82, temperature 98.6 F (37 C), height '5\' 3"'$  (1.6 m), weight (!) 438 lb (198.7 kg). Body mass index is 77.59 kg/m.  General: Cooperative, alert, well developed, in no acute distress. HEENT: Conjunctivae and lids unremarkable. Cardiovascular: Regular rhythm.  Lungs: Normal work of breathing. Neurologic: No focal deficits.   Lab Results  Component Value Date   CREATININE 1.12 (H) 09/17/2021   BUN 20 09/17/2021   NA 136 09/17/2021   K 3.7 09/17/2021   CL 96 (L) 09/17/2021   CO2 27 09/17/2021   Lab Results  Component Value Date   ALT 17 09/14/2021   AST 16 09/14/2021   ALKPHOS 89 09/14/2021   BILITOT 0.2 09/14/2021   Lab Results  Component Value Date   HGBA1C 5.9 (H) 11/11/2020   HGBA1C 5.6 12/17/2019   HGBA1C 5.6 07/04/2019   HGBA1C 5.4 12/04/2018   HGBA1C 5.8 (H) 07/25/2018   Lab Results  Component  Value Date   INSULIN 23.6 11/11/2020   INSULIN 17.1 12/17/2019   INSULIN 23.5 07/04/2019   INSULIN 25.8 (H) 12/04/2018   INSULIN 24.7 07/25/2018   Lab Results  Component Value Date   TSH 2.94 01/08/2021   Lab Results  Component Value Date   CHOL 185 11/11/2020   HDL 53 11/11/2020   LDLCALC 103 (H) 11/11/2020   LDLDIRECT 141.0 03/02/2016   TRIG 169 (H) 11/11/2020   CHOLHDL 4 03/23/2018   Lab Results  Component Value Date   VD25OH 30.2 11/11/2020   VD25OH 38.2  12/17/2019   VD25OH 34.6 07/04/2019   Lab Results  Component Value Date   WBC 7.4 09/17/2021   HGB 11.3 (L) 09/17/2021   HCT 38.5 09/17/2021   MCV 89.1 09/17/2021   PLT 266 09/17/2021   No results found for: IRON, TIBC, FERRITIN  Attestation Statements:   Reviewed by clinician on day of visit: allergies, medications, problem list, medical history, surgical history, family history, social history, and previous encounter notes.  IMarcille Beck, CMA, am acting as transcriptionist for Coralie Common, MD  I have reviewed the above documentation for accuracy and completeness, and I agree with the above. - Coralie Common, MD

## 2022-03-28 ENCOUNTER — Encounter (INDEPENDENT_AMBULATORY_CARE_PROVIDER_SITE_OTHER): Payer: Self-pay | Admitting: Family Medicine

## 2022-03-28 ENCOUNTER — Telehealth (INDEPENDENT_AMBULATORY_CARE_PROVIDER_SITE_OTHER): Payer: BC Managed Care – PPO | Admitting: Family Medicine

## 2022-03-28 ENCOUNTER — Other Ambulatory Visit (HOSPITAL_COMMUNITY): Payer: Self-pay

## 2022-03-28 DIAGNOSIS — Z7984 Long term (current) use of oral hypoglycemic drugs: Secondary | ICD-10-CM

## 2022-03-28 DIAGNOSIS — Z6841 Body Mass Index (BMI) 40.0 and over, adult: Secondary | ICD-10-CM

## 2022-03-28 DIAGNOSIS — E1165 Type 2 diabetes mellitus with hyperglycemia: Secondary | ICD-10-CM | POA: Diagnosis not present

## 2022-03-28 DIAGNOSIS — I5032 Chronic diastolic (congestive) heart failure: Secondary | ICD-10-CM

## 2022-03-28 DIAGNOSIS — E669 Obesity, unspecified: Secondary | ICD-10-CM | POA: Diagnosis not present

## 2022-03-28 MED ORDER — TIRZEPATIDE 7.5 MG/0.5ML ~~LOC~~ SOAJ
7.5000 mg | SUBCUTANEOUS | 0 refills | Status: DC
  Filled 2022-03-28: qty 2, 28d supply, fill #0

## 2022-03-28 NOTE — Progress Notes (Signed)
?TeleHealth Visit:  ?This visit was completed with telemedicine (audio/video) technology. ?Alexandra Beck has verbally consented to this TeleHealth visit. The patient is located at home, the provider is located at home. The participants in this visit include the listed provider and patient. The visit was conducted today via MyChart video. ? ?OBESITY ?Alexandra Beck is here to discuss her progress with her obesity treatment plan along with follow-up of her obesity related diagnoses.  ? ?Today's visit was # 18 ?Starting weight: 468 lbs ?Starting date: 04/17/2018 ?Weight at last in office visit: 438 lbs on 03/14/22 ?Total weight loss: 30 lbs at last in office visit on 03/14/22. ?Today's reported weight:  No weight reported. ? ?Nutrition Plan: the Category 4 Plan 70% of the time. ?Hunger is well controlled. Cravings are moderately controlled.  ?Current exercise: none ? ?Interim History: Alexandra Beck attended the [employer] concert in [place] this weekend.  She said the amount of walking she had to do was very challenging for her.  On the plus side, she and her wife packed sandwiches for the car trip going and coming back from [place] so they did not stop for fast food. ?Her wife packs her lunch for her.  She feels she has adhered to the plan fairly well over the past few weeks. ?She has lost 22 pounds since November 2022. ? ?Assessment/Plan:  ?1. Type II Diabetes ?HgbA1c is at goal. ?Medication(s): Metformin 500 mg daily, Mounjaro 7.5 mg weekly. ? ?Lab Results  ?Component Value Date  ? HGBA1C 5.9 (H) 11/11/2020  ? HGBA1C 5.6 12/17/2019  ? HGBA1C 5.6 07/04/2019  ? ?Lab Results  ?Component Value Date  ? MICROALBUR 8.05 (H) 06/26/2007  ? LDLCALC 103 (H) 11/11/2020  ? CREATININE 1.12 (H) 09/17/2021  ? ? ?Plan: ?Refill Mounjaro 7.5 mg weekly. ?Continue metformin 500 mg daily. ? ?2. CHF ?Alexandra Beck was diagnosed with CHF over 20 years ago.  This condition is stable.  She sees cardiology yearly.  Last echocardiogram in October 2022 showed an  ejection fraction of 60 to 65%.  She reports she has not been given any fluid intake limitations. ?Medications: Spironolactone 25 mg daily, torsemide 60 mg in a.m. and 40 mg in p.m., metoprolol XL 50 mg daily, losartan 100 mg daily. ? ?Plan: ?Continue all medications and follow-up with cardiology as directed. ? ? ?Obesity: Current BMI 77.61 ?Alexandra Beck is currently in the action stage of change. As such, her goal is to continue with weight loss efforts.  ?She has agreed to the Category 4 Plan.  ? ?Exercise goals:  We discussed that [place] has exercise videos.  I encouraged her to find a chair exercise videos and do it weekly until she sees Dr. Jeani Sow in 3 weeks. ? ?Behavioral modification strategies: decreasing simple carbohydrates. ? ?[name] has agreed to follow-up with our clinic in 3 weeks.  ? ?No orders of the defined types were placed in this encounter. ? ? ?Medications Discontinued During This Encounter  ?Medication Reason  ? tirzepatide [facility]) 7.5 MG/0.5ML Pen Reorder  ?  ? ?Meds ordered this encounter  ?Medications  ? tirzepatide (MOUNJARO) 7.5 MG/0.5ML Pen  ?  Sig: Inject 7.5 mg into the skin once a week.  ?  Dispense:  2 mL  ?  Refill:  0  ?  Order Specific Question:   Supervising Provider  ?  Answer:   Dennard Nip D [[id number]]  ?   ? ?Objective:  ? ?VITALS: Per patient if applicable, see vitals. ?GENERAL: Alert and in no acute distress. ?CARDIOPULMONARY:  No increased WOB. Speaking in clear sentences.  ?PSYCH: Pleasant and cooperative. Speech normal rate and rhythm. Affect is appropriate. Insight and judgement are appropriate. Attention is focused, linear, and appropriate.  ?NEURO: Oriented as arrived to appointment on time with no prompting.  ? ?Lab Results  ?Component Value Date  ? CREATININE 1.12 (H) 09/17/2021  ? BUN 20 09/17/2021  ? NA 136 09/17/2021  ? K 3.7 09/17/2021  ? CL 96 (L) 09/17/2021  ? CO2 27 09/17/2021  ? ?Lab Results  ?Component Value Date  ? ALT 17 09/14/2021  ? AST 16 09/14/2021  ?  ALKPHOS 89 09/14/2021  ? BILITOT 0.2 09/14/2021  ? ?Lab Results  ?Component Value Date  ? HGBA1C 5.9 (H) 11/11/2020  ? HGBA1C 5.6 12/17/2019  ? HGBA1C 5.6 07/04/2019  ? HGBA1C 5.4 12/04/2018  ? HGBA1C 5.8 (H) 07/25/2018  ? ?Lab Results  ?Component Value Date  ? INSULIN 23.6 11/11/2020  ? INSULIN 17.1 12/17/2019  ? INSULIN 23.5 07/04/2019  ? INSULIN 25.8 (H) 12/04/2018  ? INSULIN 24.7 07/25/2018  ? ?Lab Results  ?Component Value Date  ? TSH 2.94 01/08/2021  ? ?Lab Results  ?Component Value Date  ? CHOL 185 11/11/2020  ? HDL 53 11/11/2020  ? LDLCALC 103 (H) 11/11/2020  ? LDLDIRECT 141.0 03/02/2016  ? TRIG 169 (H) 11/11/2020  ? CHOLHDL 4 03/23/2018  ? ?Lab Results  ?Component Value Date  ? WBC 7.4 09/17/2021  ? HGB 11.3 (L) 09/17/2021  ? HCT 38.5 09/17/2021  ? MCV 89.1 09/17/2021  ? PLT 266 09/17/2021  ? ?No results found for: IRON, TIBC, FERRITIN ?Lab Results  ?Component Value Date  ? VD25OH 30.2 11/11/2020  ? VD25OH 38.2 12/17/2019  ? VD25OH 34.6 07/04/2019  ? ? ?Attestation Statements:  ? ?Reviewed by clinician on day of visit: allergies, medications, problem list, medical history, surgical history, family history, social history, and previous encounter notes. ? ?

## 2022-03-30 ENCOUNTER — Ambulatory Visit: Payer: BC Managed Care – PPO | Admitting: Psychology

## 2022-04-06 ENCOUNTER — Ambulatory Visit (INDEPENDENT_AMBULATORY_CARE_PROVIDER_SITE_OTHER): Payer: BC Managed Care – PPO | Admitting: Psychology

## 2022-04-06 DIAGNOSIS — F428 Other obsessive-compulsive disorder: Secondary | ICD-10-CM

## 2022-04-06 DIAGNOSIS — F331 Major depressive disorder, recurrent, moderate: Secondary | ICD-10-CM | POA: Diagnosis not present

## 2022-04-06 NOTE — Progress Notes (Signed)
[facility] Counselor/Therapist Progress Note ? ?Patient ID: Alexandra Beck, MRN: [id number],   ? ?Date: 04/06/2022 ? ?Time Spent: 60 minutes ? ?Treatment Type: Individual Therapy ? ?Reported Symptoms: sadness,  ? ?Mental Status Exam: ?Appearance:  Casual     ?Behavior: Appropriate  ?Motor: Normal  ?Speech/Language:  Normal Rate  ?Affect: Appropriate  ?Mood: Pleasant  ?Thought process: normal  ?Thought content:   WNL  ?Sensory/Perceptual disturbances:   WNL  ?Orientation: oriented to person, place, time/date, and situation  ?Attention: Good  ?Concentration: Good  ?Memory: WNL  ?Fund of knowledge:  Good  ?Insight:   Good  ?Judgment:  Good  ?Impulse Control: Good  ? ?Risk Assessment: ?Danger to Self:  No ?Self-injurious Behavior: No ?Danger to Others: No ?Duty to Warn:no ?Physical Aggression / Violence:No  ?Access to Firearms a concern: No  ?Gang Involvement:No  ? ?Subjective: The patient attended a face-to-face individual therapy session today via video visit due to COVID-19.  The patient gave verbal consent for the video visit to be on WebEx.  The patient was in her home alone and the therapist was in the office.   ?The patient presents as pleasant and cooperative.  The patient reports that she had a great time at the [employer] concert with her wife.  The patient is smiling more and seems to be struggling less with sadness.  The patient talked about not being as bothered by not being able to play the game with her friend on line.  The patient seems to be doing much better now that she is detach a little from that relationship as that circumstance was causing her issues between she and her wife.  The patient also reports that she has been feeling more like taking a shower lately and is planning to get into doing that at least twice a week.  I gave the patient positive validation for following through on things and for working with herself on setting better limits for herself. ? ?Interventions:  Cognitive Behavioral Therapy, Assertiveness/Communication, and Problem solving ? ?Diagnosis:Major depressive disorder, recurrent episode, moderate ([place]) ? ?Obsessional thoughts ? ?Plan: Treatment Plan ? ?Strengths/Abilities:  Intelligent, ability for insight, supportive wife ? ?Treatment Preferences:  Outpatient Individual therapy ? ?Statement of Needs:  "I Need help with my depression and motivation. " ? ?Symptoms:Depressed or irritable mood.:(Status: improved). Diminished interest in or  ?enjoyment of activities.(Status: improved). Feelings of hopelessness,  ?worthlessness, or inappropriate guilt.: (Status: improved). History of chronic or ?recurrent depression for which the client has taken antidepressant medication, been hospitalized, had  ?outpatient treatment, or had a course of electroconvulsive therapy.(Status:  ?maintained). Lack of energy.: (Status: maintained). Low self-esteem.:  (Status: maintained). Poor concentration and indecisiveness.:  (Status: improved). Social withdrawal.:  (Status: maintained).  ?Unresolved grief issues.: (Status: maintained). ? ?Problems Addressed:  Unipolar Depression ? ?Goals:  ?LTG:1. Alleviate depressive symptoms and return to previous level of effective  ?functioning.  60% ?2. Appropriately grieve the loss in order to normalize mood and to return  ?to previously adaptive level of functioning. ?3. Develop healthy interpersonal relationships that lead to the alleviation  ?and help prevent the relapse of depression.  60% ?4. Develop healthy thinking patterns and beliefs about self, others, and the ?world that lead to the alleviation and help prevent the relapse of  ?Depression  60% ?STG:1.Describe current and past experiences with depression including their impact on functioning and  ?attempts to resolve it.  60 % ?2. Identify and replace thoughts and beliefs that support depression.  50 % ?3.Learn and implement behavioral strategies to overcome depression. 50% ?4.Verbalize an  understanding of healthy and unhealthy emotions with the intent of increasing the use of  ?healthy emotions to guide actions.  30 % ? ?Target Date:  10/07/2022 ?Frequency:  Weekly ?Modality:Individual ?Interventions by Therapist:  CBT, problem solving, EMDR, insight oriented ? ?Patient approved Treatment Plan ? ?Nieve Rojero G Vayda Dungee, LCSW ? ? ? ? ? ? ? ? ? ? ? ? ? ? ? ? ? ? ? ? ? ? ? ? ? ? ? ?Braddock Servellon G Katalena Malveaux, LCSW ? ? ? ? ? ? ? ? ? ? ? ? ? ? ?Climmie Buelow G Makyia Erxleben, LCSW ? ? ? ? ? ? ? ? ? ? ? ? ? ? ?Justino Boze G Kenlei Safi, LCSW ? ? ? ? ? ? ? ? ? ? ? ? ? ? ?Amadou Katzenstein G Khadeja Abt, LCSW ? ? ? ? ? ? ? ? ? ? ? ? ? ? ?Breniyah Romm G Emorie Mcfate, LCSW ? ? ? ? ? ? ? ? ? ? ? ? ? ? ?Rye Dorado G Rokhaya Quinn, LCSW ? ? ? ? ? ? ? ? ? ? ? ? ? ? ?Layney Gillson G Rube Sanchez, LCSW ? ? ? ? ? ? ? ? ? ? ? ? ? ? ?Delora Gravatt G Tiffiney Sparrow, LCSW ? ? ? ? ? ? ? ? ? ? ? ? ? ? ?Amaris Garrette G Kairah Leoni, LCSW ? ? ? ? ? ? ? ? ? ? ? ? ? ? ?Trason Shifflet G Dante Roudebush, LCSW ? ? ? ? ? ? ? ? ? ? ? ? ? ? ?[name] Fake G Juniper Cobey, LCSW ? ? ? ? ? ? ? ? ? ? ? ? ? ? ?Christy Ehrsam G Kawehi Hostetter, LCSW ? ? ? ? ? ? ? ? ? ? ? ? ? ? ?Jordon Bourquin G Haile Bosler, LCSW ? ? ? ? ? ? ? ? ? ? ? ? ? ? ?Maddock Finigan G Romana Deaton, LCSW ? ? ? ? ? ? ? ? ? ? ? ? ? ? ?Dotty Gonzalo G Tayari Yankee, LCSW ?

## 2022-04-07 ENCOUNTER — Other Ambulatory Visit (HOSPITAL_COMMUNITY): Payer: Self-pay

## 2022-04-08 ENCOUNTER — Ambulatory Visit: Payer: BC Managed Care – PPO | Admitting: Psychology

## 2022-04-13 ENCOUNTER — Ambulatory Visit: Payer: BC Managed Care – PPO | Admitting: Psychology

## 2022-04-15 ENCOUNTER — Ambulatory Visit: Payer: BC Managed Care – PPO | Admitting: Psychology

## 2022-04-18 ENCOUNTER — Other Ambulatory Visit (HOSPITAL_COMMUNITY): Payer: Self-pay

## 2022-04-18 ENCOUNTER — Ambulatory Visit (INDEPENDENT_AMBULATORY_CARE_PROVIDER_SITE_OTHER): Payer: BC Managed Care – PPO | Admitting: Family Medicine

## 2022-04-18 ENCOUNTER — Encounter (INDEPENDENT_AMBULATORY_CARE_PROVIDER_SITE_OTHER): Payer: Self-pay | Admitting: Family Medicine

## 2022-04-18 VITALS — BP 116/67 | HR 71 | Temp 98.2°F | Ht 63.0 in | Wt >= 6400 oz

## 2022-04-18 DIAGNOSIS — F329 Major depressive disorder, single episode, unspecified: Secondary | ICD-10-CM | POA: Diagnosis not present

## 2022-04-18 DIAGNOSIS — E669 Obesity, unspecified: Secondary | ICD-10-CM | POA: Diagnosis not present

## 2022-04-18 DIAGNOSIS — Z6841 Body Mass Index (BMI) 40.0 and over, adult: Secondary | ICD-10-CM

## 2022-04-18 DIAGNOSIS — E1165 Type 2 diabetes mellitus with hyperglycemia: Secondary | ICD-10-CM

## 2022-04-18 DIAGNOSIS — Z7985 Long-term (current) use of injectable non-insulin antidiabetic drugs: Secondary | ICD-10-CM

## 2022-04-18 MED ORDER — TIRZEPATIDE 7.5 MG/0.5ML ~~LOC~~ SOAJ
7.5000 mg | SUBCUTANEOUS | 0 refills | Status: DC
  Filled 2022-04-18: qty 2, 28d supply, fill #0

## 2022-04-20 ENCOUNTER — Ambulatory Visit (INDEPENDENT_AMBULATORY_CARE_PROVIDER_SITE_OTHER): Payer: BC Managed Care – PPO | Admitting: Psychology

## 2022-04-20 DIAGNOSIS — F428 Other obsessive-compulsive disorder: Secondary | ICD-10-CM | POA: Diagnosis not present

## 2022-04-20 DIAGNOSIS — F331 Major depressive disorder, recurrent, moderate: Secondary | ICD-10-CM

## 2022-04-20 NOTE — Progress Notes (Signed)
[facility] Counselor/Therapist Progress Note  Patient ID: Alexandra Beck, MRN: [id number],    Date: 04/20/2022  Time Spent: 60 minutes  Treatment Type: Individual Therapy  Reported Symptoms: sadness,   Mental Status Exam: Appearance:  Casual     Behavior: Appropriate  Motor: Normal  Speech/Language:  Normal Rate  Affect: Appropriate  Mood: Pleasant  Thought process: normal  Thought content:   WNL  Sensory/Perceptual disturbances:   WNL  Orientation: oriented to person, place, time/date, and situation  Attention: Good  Concentration: Good  Memory: WNL  Fund of knowledge:  Good  Insight:   Good  Judgment:  Good  Impulse Control: Good   Risk Assessment: Danger to Self:  No Self-injurious Behavior: No Danger to Others: No Duty to Warn:no Physical Aggression / Violence:No  Access to Firearms a concern: No  Gang Involvement:No   Subjective: The patient attended a face-to-face individual therapy session today via video visit .  The patient gave verbal consent for the video visit to be on WebEx.  The patient was in her home alone and the therapist was in the office.   The patient presents as pleasant and cooperative.  The patient had to miss her session last week because she was sick.  She reports that she is doing better this week.  We talked about some issues that she had this week with her wife [name].  The patient talked about her wife having some insecurity about her relationship with her online gaming friend as they have been playing more this week.  We talked about how to reassure her wife and we also talked about some of their sexual issues.  I gave her a recommendation of a book that might be helpful for the 2 of them.  The patient also brought up at the very end of the session that she is having feelings again for the online gaming friends so we will discuss this a little more in terms of how to reframe it so that it does not cause her problems like it has  before.  Interventions: Cognitive Behavioral Therapy, Assertiveness/Communication, and Problem solving  Diagnosis:Major depressive disorder, recurrent episode, moderate (HCC)  Obsessional thoughts  Plan: Treatment Plan  Strengths/Abilities:  Intelligent, ability for insight, supportive wife  Treatment Preferences:  Outpatient Individual therapy  Statement of Needs:  "I Need help with my depression and motivation. "  Symptoms:Depressed or irritable mood.:(Status: improved). Diminished interest in or  enjoyment of activities.(Status: improved). Feelings of hopelessness,  worthlessness, or inappropriate guilt.: (Status: improved). History of chronic or recurrent depression for which the client has taken antidepressant medication, been hospitalized, had  outpatient treatment, or had a course of electroconvulsive therapy.(Status:  maintained). Lack of energy.: (Status: maintained). Low self-esteem.:  (Status: maintained). Poor concentration and indecisiveness.:  (Status: improved). Social withdrawal.:  (Status: maintained).  Unresolved grief issues.: (Status: maintained).  Problems Addressed:  Unipolar Depression  Goals:  LTG:1. Alleviate depressive symptoms and return to previous level of effective  functioning.  60% 2. Appropriately grieve the loss in order to normalize mood and to return  to previously adaptive level of functioning. 3. Develop healthy interpersonal relationships that lead to the alleviation  and help prevent the relapse of depression.  60% 4. Develop healthy thinking patterns and beliefs about self, others, and the world that lead to the alleviation and help prevent the relapse of  Depression  60% STG:1.Describe current and past experiences with depression including their impact on functioning and  attempts to resolve  it.  60 % 2. Identify and replace thoughts and beliefs that support depression.  50 % 3.Learn and implement behavioral strategies to overcome  depression. 50% 4.Verbalize an understanding of healthy and unhealthy emotions with the intent of increasing the use of  healthy emotions to guide actions.  30 %  Target Date:  10/07/2022 Frequency:  Weekly Modality:Individual Interventions by Therapist:  CBT, problem solving, EMDR, insight oriented  Patient approved Treatment Plan  Liyat Faulkenberry G Amariah Kierstead, LCSW                            Zeola Brys G Jasun Gasparini, LCSW               Kiya Eno G Deshawn Witty, LCSW               Akaiya Touchette [employer], LCSW               Caiden Arteaga [employer], LCSW               Shamiya Demeritt [employer], LCSW               Lacheryl Niesen [employer], LCSW               Ajiah Mcglinn [employer], LCSW               Amaal Dimartino [employer], LCSW               Aodhan Scheidt [employer], LCSW               Yarenis Cerino [employer], LCSW               Lillias Difrancesco [employer], LCSW               Claudie Rathbone [employer], LCSW               Shalah Estelle [employer], LCSW               Sally Reimers [employer], LCSW               Francess Mullen G Waldo Damian, LCSW               Demareon Coldwell [place], LCSW

## 2022-04-22 ENCOUNTER — Ambulatory Visit: Payer: BC Managed Care – PPO | Admitting: Psychology

## 2022-04-25 NOTE — Progress Notes (Signed)
Chief Complaint:   OBESITY Alexandra Beck is here to discuss her progress with her obesity treatment plan along with follow-up of her obesity related diagnoses. Alexandra Beck is on the Category 4 Plan and states she is following her eating plan approximately 65% of the time. Alexandra Beck states she is chair exercises 2 times per week.  Today's visit was #: 59 Starting weight: 468 lbs Starting date: 04/17/2018 Today's weight: 435 lbs Today's date: 04/18/2022 Total lbs lost to date: 33 lbs Total lbs lost since last in-office visit: 3 lbs  Interim History: Alexandra Beck over the last few weeks she has done really well.  She was able to go to the [employer] in [place] which was amazing.  She did do a significant amount of walking getting to the stadium.  She has had quite a bit of post nasal drip for last week.  Planning to go to her mom's for Memorial Day BBQ this weekend.  Going to get grocer order in next few days.  Subjective:   1. Type 2 diabetes mellitus with hyperglycemia, without long-term current use of insulin (HCC) Whitleigh is currently on Mounjaro with reasonal control of appetite and craving control.   2. Major depressive disorder with current active episode, unspecified depression episode severity, unspecified whether recurrent Alexandra Beck see Bambi Cottle and Deloria Lair for management. She is currently on Rexulti and Zoloft.  She denies any suicidal or homicidal ideations.   Assessment/Plan:   1. Type 2 diabetes mellitus with hyperglycemia, without long-term current use of insulin (HCC) Refill Mounjaro.  See below.  - tirzepatide (MOUNJARO) 7.5 MG/0.5ML Pen; Inject 7.5 mg (1 pen) into the skin once a week.  Dispense: 2 mL; Refill: 0  2. Major depressive disorder with current active episode, unspecified depression episode severity, unspecified whether recurrent Alexandra Beck has agreed to continue current medication,and follow up with above providers for management.   3. Obesity with current BMI of  77.1 Alexandra Beck is currently in the action stage of change. As such, her goal is to continue with weight loss efforts. She has agreed to the Category 4 Plan.   Exercise goals: All adults should avoid inactivity. Some physical activity is better than none, and adults who participate in any amount of physical activity gain some health benefits.  Behavioral modification strategies: increasing lean protein intake, meal planning and cooking strategies, and keeping healthy foods in the home.  Tanaysha has agreed to follow-up with our clinic in 3 weeks. She was informed of the importance of frequent follow-up visits to maximize her success with intensive lifestyle modifications for her multiple health conditions.   Objective:   Blood pressure 116/67, pulse 71, temperature 98.2 F (36.8 C), height '5\' 3"'$  (1.6 m), weight (!) 435 lb (197.3 kg), SpO2 94 %. Body mass index is 77.06 kg/m.  General: Cooperative, alert, well developed, in no acute distress. HEENT: Conjunctivae and lids unremarkable. Cardiovascular: Regular rhythm.  Lungs: Normal work of breathing. Neurologic: No focal deficits.   Lab Results  Component Value Date   CREATININE 1.12 (H) 09/17/2021   BUN 20 09/17/2021   NA 136 09/17/2021   K 3.7 09/17/2021   CL 96 (L) 09/17/2021   CO2 27 09/17/2021   Lab Results  Component Value Date   ALT 17 09/14/2021   AST 16 09/14/2021   ALKPHOS 89 09/14/2021   BILITOT 0.2 09/14/2021   Lab Results  Component Value Date   HGBA1C 5.9 (H) 11/11/2020   HGBA1C 5.6 12/17/2019   HGBA1C  5.6 07/04/2019   HGBA1C 5.4 12/04/2018   HGBA1C 5.8 (H) 07/25/2018   Lab Results  Component Value Date   INSULIN 23.6 11/11/2020   INSULIN 17.1 12/17/2019   INSULIN 23.5 07/04/2019   INSULIN 25.8 (H) 12/04/2018   INSULIN 24.7 07/25/2018   Lab Results  Component Value Date   TSH 2.94 01/08/2021   Lab Results  Component Value Date   CHOL 185 11/11/2020   HDL 53 11/11/2020   LDLCALC 103 (H) 11/11/2020    LDLDIRECT 141.0 03/02/2016   TRIG 169 (H) 11/11/2020   CHOLHDL 4 03/23/2018   Lab Results  Component Value Date   VD25OH 30.2 11/11/2020   VD25OH 38.2 12/17/2019   VD25OH 34.6 07/04/2019   Lab Results  Component Value Date   WBC 7.4 09/17/2021   HGB 11.3 (L) 09/17/2021   HCT 38.5 09/17/2021   MCV 89.1 09/17/2021   PLT 266 09/17/2021   No results found for: IRON, TIBC, FERRITIN  Attestation Statements:   Reviewed by clinician on day of visit: allergies, medications, problem list, medical history, surgical history, family history, social history, and previous encounter notes.  I, Davy Pique, RMA, am acting as transcriptionist for Coralie Common, MD.  I have reviewed the above documentation for accuracy and completeness, and I agree with the above. - Coralie Common, MD

## 2022-04-27 ENCOUNTER — Ambulatory Visit (INDEPENDENT_AMBULATORY_CARE_PROVIDER_SITE_OTHER): Payer: BC Managed Care – PPO | Admitting: Psychology

## 2022-04-27 DIAGNOSIS — F428 Other obsessive-compulsive disorder: Secondary | ICD-10-CM

## 2022-04-27 DIAGNOSIS — F331 Major depressive disorder, recurrent, moderate: Secondary | ICD-10-CM | POA: Diagnosis not present

## 2022-04-27 NOTE — Progress Notes (Signed)
[place] Counselor/Therapist Progress Note  Patient ID: Alexandra Beck, MRN: [id number],    Date: 04/27/2022  Time Spent: 60 minutes  Treatment Type: Individual Therapy  Reported Symptoms: sadness,   Mental Status Exam: Appearance:  Casual     Behavior: Appropriate  Motor: Normal  Speech/Language:  Normal Rate  Affect: Appropriate  Mood: Pleasant  Thought process: normal  Thought content:   WNL  Sensory/Perceptual disturbances:   WNL  Orientation: oriented to person, place, time/date, and situation  Attention: Good  Concentration: Good  Memory: WNL  Fund of knowledge:  Good  Insight:   Good  Judgment:  Good  Impulse Control: Good   Risk Assessment: Danger to Self:  No Self-injurious Behavior: No Danger to Others: No Duty to Warn:no Physical Aggression / Violence:No  Access to Firearms a concern: No  Gang Involvement:No   Subjective: The patient attended a face-to-face individual therapy session today via video visit .  The patient gave verbal consent for the video visit to be on WebEx.  The patient was in her home alone and the therapist was in the office.   The patient presents as pleasant and cooperative.  The patient reports that she and [name] have had a difficult time this week.  [name] has said that she cannot be on the patient's dream when she is having Bob on there.  We talked about some of the issues that they are having and I recommended that she do some validation and reassurance for that her wife.  We talked about the purpose of this and talked about the importance of her feeling secure in the relationship with the patient.  Interventions: Cognitive Behavioral Therapy, Assertiveness/Communication, and Problem solving  Diagnosis:Major depressive disorder, recurrent episode, moderate (HCC)  Obsessional thoughts  Plan: Treatment Plan  Strengths/Abilities:  Intelligent, ability for insight, supportive wife  Treatment Preferences:  Outpatient  Individual therapy  Statement of Needs:  "I Need help with my depression and motivation. "  Symptoms:Depressed or irritable mood.:(Status: improved). Diminished interest in or  enjoyment of activities.(Status: improved). Feelings of hopelessness,  worthlessness, or inappropriate guilt.: (Status: improved). History of chronic or recurrent depression for which the client has taken antidepressant medication, been hospitalized, had  outpatient treatment, or had a course of electroconvulsive therapy.(Status:  maintained). Lack of energy.: (Status: maintained). Low self-esteem.:  (Status: maintained). Poor concentration and indecisiveness.:  (Status: improved). Social withdrawal.:  (Status: maintained).  Unresolved grief issues.: (Status: maintained).  Problems Addressed:  Unipolar Depression  Goals:  LTG:1. Alleviate depressive symptoms and return to previous level of effective  functioning.  60% 2. Appropriately grieve the loss in order to normalize mood and to return  to previously adaptive level of functioning. 3. Develop healthy interpersonal relationships that lead to the alleviation  and help prevent the relapse of depression.  60% 4. Develop healthy thinking patterns and beliefs about self, others, and the world that lead to the alleviation and help prevent the relapse of  Depression  60% STG:1.Describe current and past experiences with depression including their impact on functioning and  attempts to resolve it.  60 % 2. Identify and replace thoughts and beliefs that support depression.  50 % 3.Learn and implement behavioral strategies to overcome depression. 50% 4.Verbalize an understanding of healthy and unhealthy emotions with the intent of increasing the use of  healthy emotions to guide actions.  30 %  Target Date:  10/07/2022 Frequency:  Weekly Modality:Individual Interventions by Therapist:  CBT, problem solving, EMDR, insight oriented  Patient approved Treatment  Plan  Kmya Placide G Cheyne Boulden, LCSW                            Miaya Lafontant G Idelia Caudell, LCSW               Huong Luthi G Cadan Maggart, LCSW               Felix Pratt [employer], LCSW               Tim Wilhide [employer], LCSW               [name] Vizcarrondo [employer], LCSW               Zamya Culhane [employer], LCSW               Samarrah Tranchina [employer], LCSW               Rashida Ladouceur [employer], LCSW               Emilliano Dilworth [employer], LCSW               Markeisha Mancias [employer], LCSW               Martavia Tye [employer], LCSW               Leslee Haueter [employer], LCSW               Donye Campanelli [employer], LCSW               Kenndra Morris [place], LCSW               Ellison Leisure [employer] [place], LCSW               Nabilah Davoli G Aanchal Cope, LCSW               Kadan Millstein [place], LCSW

## 2022-04-29 ENCOUNTER — Telehealth: Payer: Self-pay | Admitting: Primary Care

## 2022-04-29 ENCOUNTER — Ambulatory Visit: Payer: BC Managed Care – PPO | Admitting: Psychology

## 2022-04-29 ENCOUNTER — Telehealth (INDEPENDENT_AMBULATORY_CARE_PROVIDER_SITE_OTHER): Payer: BC Managed Care – PPO | Admitting: Primary Care

## 2022-04-29 ENCOUNTER — Other Ambulatory Visit (HOSPITAL_COMMUNITY): Payer: Self-pay

## 2022-04-29 DIAGNOSIS — G4733 Obstructive sleep apnea (adult) (pediatric): Secondary | ICD-10-CM

## 2022-04-29 MED ORDER — MODAFINIL 100 MG PO TABS
ORAL_TABLET | ORAL | 3 refills | Status: DC
  Filled 2022-04-29: qty 60, fill #0
  Filled 2022-05-12: qty 60, 60d supply, fill #0
  Filled 2022-07-09: qty 60, 60d supply, fill #1
  Filled 2022-09-10: qty 60, 60d supply, fill #2

## 2022-04-29 MED ORDER — MODAFINIL 200 MG PO TABS
ORAL_TABLET | ORAL | 3 refills | Status: DC
  Filled 2022-04-29: qty 30, 30d supply, fill #0
  Filled 2022-05-12: qty 60, 60d supply, fill #0
  Filled 2022-05-21: qty 60, fill #0

## 2022-04-29 NOTE — Progress Notes (Signed)
Virtual Visit via Video Note  I connected with Alexandra Beck on 04/29/22 at  2:00 PM EDT by a video enabled telemedicine application and verified that I am speaking with the correct person using two identifiers.  Location: Patient: Home Provider: Office   I discussed the limitations of evaluation and management by telemedicine and the availability of in person appointments. The patient expressed understanding and agreed to proceed.  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  She takes 80 mg of Lasix daily  Last office visit 08/2020 was reviewed.  She developed increased shortness of breath, BNP was high, D-dimer was slightly elevated. She went to the ED, CT angiogram was negative for pulmonary embolism, echo showed diastolic dysfunction, LVEF was normal. She increase her torsemide, weight dropped from 467 to 455 pounds and breathing is improved. Reviewed PFTs today  She is compliant with her CPAP machine, getting an error message on the machine and would like a replacement CPAP.  No problems with mask or pressure.  We have received request for refills on armodafinil and this was sent, prior authorization was done until October 2023 She is on 300 mg which she takes every day in the mornings prior to going to work at noon.  For some reason she only received 30 tablets of 100 mg modafinil and 60 tablets of 200 mg  04/29/2022- Interim hx  Patient contacted today for 47-monthfollow-up via video visit.  She has a history of OSA/OHS and narcolepsy without cataplexy.  She reports 100% compliance with CPAP.  She takes modafinil (provigil) '300mg'$  in the morning, this is working well for her. She had been on '200mg'$  in the morning but she would get tired mid morning/afternoon and had a hard time remembering  to take her afternoon provigil dose. She wears CPAP  on average 8 hours. She works from [employer] Typically sleeps from 2am-10am. She received message on her CPAP states that motor had exceeded lifetime expectancy. She needs a new order for machine. She is unhappy with [employer], states that she has a hard time getting a hold of anyone to speak with. She will stay with DME company however.    Significant tests/ events reviewed  PFTs 09/2021 moderate restriction, no airway obstruction  NPSG 2009:  AHI 93/hr On auto 5-20cm    ABG  06/2014 showing mild hypercarbia 7.35/50 1/90 5/97%    CPAP titration 05/2018 >> CPAP 15 + 3L O2 (434 lbs )   MSLT >> SOREMs x3   Observations/Objective:  - Appears well; No respirations   Assessment and Plan:  Severe OSA: - Stable; Patient is complaint with CPAP, per last note in Nov an order was placed for auto setting 12-20cm h20. [place]ing [place] from [facility] Order for new machine placed   Narcolepsy: - Stable; No significant daytime sleepiness  - MSLT >> SOREMs x3  - Continue Provigil '300mg'$  daily in the morning (prior auth is good through October 2023)  Follow Up Instructions:  - 6 months with Dr. AElsworth Sohoor BEustaquio MaizeNP   I discussed the assessment and treatment plan with the patient. The patient was provided an opportunity to ask questions and all were answered. The patient agreed with the plan and demonstrated an understanding of the instructions.  The patient was advised to call back or seek an in-person evaluation if the symptoms worsen or if the condition fails to improve as anticipated.  I provided 22 minutes of non-face-to-face time during this encounter.   Alexandra Ehrich, NP

## 2022-04-29 NOTE — Telephone Encounter (Signed)
Just  head up this patient typically need PA for her modafinil once a year, she thinks that will be coming up in September

## 2022-05-04 ENCOUNTER — Ambulatory Visit (INDEPENDENT_AMBULATORY_CARE_PROVIDER_SITE_OTHER): Payer: BC Managed Care – PPO | Admitting: Psychology

## 2022-05-04 DIAGNOSIS — F428 Other obsessive-compulsive disorder: Secondary | ICD-10-CM

## 2022-05-04 DIAGNOSIS — F331 Major depressive disorder, recurrent, moderate: Secondary | ICD-10-CM

## 2022-05-04 NOTE — Progress Notes (Signed)
[place] Counselor/Therapist Progress Note  Patient ID: Alexandra Beck, MRN: [id number],    Date: 05/04/2022  Time Spent: 60 minutes  Treatment Type: Individual Therapy  Reported Symptoms: sadness,   Mental Status Exam: Appearance:  Casual     Behavior: Appropriate  Motor: Normal  Speech/Language:  Normal Rate  Affect: Appropriate  Mood: Pleasant  Thought process: normal  Thought content:   WNL  Sensory/Perceptual disturbances:   WNL  Orientation: oriented to person, place, time/date, and situation  Attention: Good  Concentration: Good  Memory: WNL  Fund of knowledge:  Good  Insight:   Good  Judgment:  Good  Impulse Control: Good   Risk Assessment: Danger to Self:  No Self-injurious Behavior: No Danger to Others: No Duty to Warn:no Physical Aggression / Violence:No  Access to Firearms a concern: No  Gang Involvement:No   Subjective: The patient attended a face-to-face individual therapy session today via video visit .  The patient gave verbal consent for the video visit to be on WebEx.  The patient was in her home alone and the therapist was in the office.   The patient presents as pleasant and cooperative.  The patient reports that she had a terrible nightmare this week and we talked about the dream itself.  I believe that the dream was related to her angst about being too empathic and how people have responded to her in the past from her being that way.  We processed a little more about how things are going with her wife and she is doing better but her wife is struggling still with the online relationship that the patient has.  The patient is doing a much better job of showering and reports that she has been doing it twice a week for 3 weeks.  The patient is making good progress in therapy and seems to be able to recognize her issues and is doing much better with her symptoms management. Interventions: Cognitive Behavioral Therapy,  Assertiveness/Communication, and Problem solving  Diagnosis:Major depressive disorder, recurrent episode, moderate (HCC)  Obsessional thoughts  Plan: Treatment Plan  Strengths/Abilities:  Intelligent, ability for insight, supportive wife  Treatment Preferences:  Outpatient Individual therapy  Statement of Needs:  "I Need help with my depression and motivation. "  Symptoms:Depressed or irritable mood.:(Status: improved). Diminished interest in or  enjoyment of activities.(Status: improved). Feelings of hopelessness,  worthlessness, or inappropriate guilt.: (Status: improved). History of chronic or recurrent depression for which the client has taken antidepressant medication, been hospitalized, had  outpatient treatment, or had a course of electroconvulsive therapy.(Status:  maintained). Lack of energy.: (Status: maintained). Low self-esteem.:  (Status: maintained). Poor concentration and indecisiveness.:  (Status: improved). Social withdrawal.:  (Status: maintained).  Unresolved grief issues.: (Status: maintained).  Problems Addressed:  Unipolar Depression  Goals:  LTG:1. Alleviate depressive symptoms and return to previous level of effective  functioning.  60% 2. Appropriately grieve the loss in order to normalize mood and to return  to previously adaptive level of functioning. 3. Develop healthy interpersonal relationships that lead to the alleviation  and help prevent the relapse of depression.  60% 4. Develop healthy thinking patterns and beliefs about self, others, and the world that lead to the alleviation and help prevent the relapse of  Depression  60% STG:1.Describe current and past experiences with depression including their impact on functioning and  attempts to resolve it.  60 % 2. Identify and replace thoughts and beliefs that support depression.  50 % 3.Learn and implement  behavioral strategies to overcome depression. 50% 4.Verbalize an understanding of healthy and  unhealthy emotions with the intent of increasing the use of  healthy emotions to guide actions.  30 %  Target Date:  10/07/2022 Frequency:  Weekly Modality:Individual Interventions by Therapist:  CBT, problem solving, EMDR, insight oriented  Patient approved Treatment Plan  Peaches Vanoverbeke G Reana Chacko, LCSW                            Barnie Sopko G Gal [place], LCSW               Shivonne Schwartzman G Lexi Conaty, LCSW               Eriyanna Kofoed [employer], LCSW               Islam Villescas [employer], LCSW               Ronelle Smallman [employer], LCSW               Monterrius Cardosa [employer], LCSW               Javana Schey [employer], LCSW               Hailee Hollick [employer], LCSW               Davette Nugent [employer], LCSW               Nawal Burling [employer], LCSW               Hortence Charter [employer], LCSW               Kaarin Pardy [employer], LCSW               Alando Colleran [employer], LCSW               Stevee Valenta [employer], LCSW               Maida Widger [employer], LCSW               Donzell Coller [employer], LCSW               Teagen Mcleary G Yonna Alwin, LCSW               Hanz Winterhalter [place], LCSW

## 2022-05-06 ENCOUNTER — Ambulatory Visit: Payer: BC Managed Care – PPO | Admitting: Psychology

## 2022-05-06 ENCOUNTER — Other Ambulatory Visit: Payer: Self-pay | Admitting: Internal Medicine

## 2022-05-08 NOTE — Progress Notes (Unsigned)
TeleHealth Visit:  This visit was completed with telemedicine (audio/video) technology. [name] has verbally consented to this TeleHealth visit. The patient is located at home, the provider is located at home. The participants in this visit include the listed provider and patient. The visit was conducted today via MyChart video.  OBESITY Alexandra Beck is here to discuss her progress with her obesity treatment plan along with follow-up of her obesity related diagnoses.   Today's visit was # 73 Starting weight: 468 lbs Starting date: 04/17/2018 Weight at last in office visit: 435 lbs on 04/18/22 Total weight loss: 33 lbs at last in office visit on 04/18/22. Today's reported weight: *** lbs No weight reported.  Nutrition Plan: the Category 4 Plan.  Hunger is {EWCONTROLASSESSMENT:24261}. Cravings are {EWCONTROLASSESSMENT:24261}.  Current exercise: {exercise types:16438}  Interim History: ***  Assessment/Plan:  1. ***  2. ***  3. ***  Obesity: Current BMI *** [name] {CHL AMB IS/IS NOT:210130109} currently in the action stage of change. As such, her goal is to {MWMwtloss#1:210800005}.  She has agreed to {MWMwtlossportion/plan2:23431}.   Exercise goals: {MWM EXERCISE RECS:23473}  Behavioral modification strategies: {MWMwtlossdietstrategies3:23432}.  [name] has agreed to follow-up with our clinic in {NUMBER 1-10:22536} weeks.   No orders of the defined types were placed in this encounter.   There are no discontinued medications.   No orders of the defined types were placed in this encounter.     Objective:   VITALS: Per patient if applicable, see vitals. GENERAL: Alert and in no acute distress. CARDIOPULMONARY: No increased WOB. Speaking in clear sentences.  PSYCH: Pleasant and cooperative. Speech normal rate and rhythm. Affect is appropriate. Insight and judgement are appropriate. Attention is focused, linear, and appropriate.  NEURO: Oriented as arrived to appointment on  time with no prompting.   Lab Results  Component Value Date   CREATININE 1.12 (H) 09/17/2021   BUN 20 09/17/2021   NA 136 09/17/2021   K 3.7 09/17/2021   CL 96 (L) 09/17/2021   CO2 27 09/17/2021   Lab Results  Component Value Date   ALT 17 09/14/2021   AST 16 09/14/2021   ALKPHOS 89 09/14/2021   BILITOT 0.2 09/14/2021   Lab Results  Component Value Date   HGBA1C 5.9 (H) 11/11/2020   HGBA1C 5.6 12/17/2019   HGBA1C 5.6 07/04/2019   HGBA1C 5.4 12/04/2018   HGBA1C 5.8 (H) 07/25/2018   Lab Results  Component Value Date   INSULIN 23.6 11/11/2020   INSULIN 17.1 12/17/2019   INSULIN 23.5 07/04/2019   INSULIN 25.8 (H) 12/04/2018   INSULIN 24.7 07/25/2018   Lab Results  Component Value Date   TSH 2.94 01/08/2021   Lab Results  Component Value Date   CHOL 185 11/11/2020   HDL 53 11/11/2020   LDLCALC 103 (H) 11/11/2020   LDLDIRECT 141.0 03/02/2016   TRIG 169 (H) 11/11/2020   CHOLHDL 4 03/23/2018   Lab Results  Component Value Date   WBC 7.4 09/17/2021   HGB 11.3 (L) 09/17/2021   HCT 38.5 09/17/2021   MCV 89.1 09/17/2021   PLT 266 09/17/2021   No results found for: "IRON", "TIBC", "FERRITIN" Lab Results  Component Value Date   VD25OH 30.2 11/11/2020   VD25OH 38.2 12/17/2019   VD25OH 34.6 07/04/2019    Attestation Statements:   Reviewed by clinician on day of visit: allergies, medications, problem list, medical history, surgical history, family history, social history, and previous encounter notes.  ***(delete if time-based billing not used)Time spent on visit including pre-visit  chart review and post-visit charting and care was *** minutes.  This time may include: -preparing to see the patient (e.g., review of tests) -obtaining and/or reviewing separately obtained history -performing a medically appropriate examination and/or evaluation -counseling and educating the patient/family/caregiver -ordering medications, tests, or procedures -referring and  communicating with other health care professionals (when not separately reported) -documenting clinical information in the electronic or other health record -independently interpreting results (not separately reported) and communicating results to the patient/ family/caregiver -care coordination (not separately reported)

## 2022-05-09 ENCOUNTER — Other Ambulatory Visit (HOSPITAL_COMMUNITY): Payer: Self-pay

## 2022-05-09 ENCOUNTER — Telehealth (INDEPENDENT_AMBULATORY_CARE_PROVIDER_SITE_OTHER): Payer: BC Managed Care – PPO | Admitting: Family Medicine

## 2022-05-09 ENCOUNTER — Encounter (INDEPENDENT_AMBULATORY_CARE_PROVIDER_SITE_OTHER): Payer: Self-pay | Admitting: Family Medicine

## 2022-05-09 DIAGNOSIS — F3289 Other specified depressive episodes: Secondary | ICD-10-CM

## 2022-05-09 DIAGNOSIS — E1169 Type 2 diabetes mellitus with other specified complication: Secondary | ICD-10-CM

## 2022-05-09 DIAGNOSIS — Z7985 Long-term (current) use of injectable non-insulin antidiabetic drugs: Secondary | ICD-10-CM

## 2022-05-09 DIAGNOSIS — E669 Obesity, unspecified: Secondary | ICD-10-CM

## 2022-05-09 DIAGNOSIS — Z6841 Body Mass Index (BMI) 40.0 and over, adult: Secondary | ICD-10-CM

## 2022-05-09 MED ORDER — TIRZEPATIDE 10 MG/0.5ML ~~LOC~~ SOAJ
10.0000 mg | SUBCUTANEOUS | 0 refills | Status: DC
  Filled 2022-05-09: qty 2, 28d supply, fill #0

## 2022-05-10 ENCOUNTER — Other Ambulatory Visit (HOSPITAL_COMMUNITY): Payer: Self-pay

## 2022-05-10 ENCOUNTER — Other Ambulatory Visit (INDEPENDENT_AMBULATORY_CARE_PROVIDER_SITE_OTHER): Payer: Self-pay | Admitting: Family Medicine

## 2022-05-10 ENCOUNTER — Encounter (INDEPENDENT_AMBULATORY_CARE_PROVIDER_SITE_OTHER): Payer: Self-pay | Admitting: Family Medicine

## 2022-05-10 ENCOUNTER — Encounter (INDEPENDENT_AMBULATORY_CARE_PROVIDER_SITE_OTHER): Payer: Self-pay

## 2022-05-10 DIAGNOSIS — E1169 Type 2 diabetes mellitus with other specified complication: Secondary | ICD-10-CM

## 2022-05-10 DIAGNOSIS — E1165 Type 2 diabetes mellitus with hyperglycemia: Secondary | ICD-10-CM

## 2022-05-10 MED ORDER — SEMAGLUTIDE (1 MG/DOSE) 4 MG/3ML ~~LOC~~ SOPN
1.0000 mg | PEN_INJECTOR | SUBCUTANEOUS | 0 refills | Status: DC
  Filled 2022-05-10: qty 3, 28d supply, fill #0

## 2022-05-10 NOTE — Telephone Encounter (Signed)
Med change request.

## 2022-05-11 ENCOUNTER — Other Ambulatory Visit (HOSPITAL_COMMUNITY): Payer: Self-pay

## 2022-05-11 ENCOUNTER — Ambulatory Visit (INDEPENDENT_AMBULATORY_CARE_PROVIDER_SITE_OTHER): Payer: BC Managed Care – PPO | Admitting: Psychology

## 2022-05-11 DIAGNOSIS — F331 Major depressive disorder, recurrent, moderate: Secondary | ICD-10-CM

## 2022-05-11 DIAGNOSIS — F428 Other obsessive-compulsive disorder: Secondary | ICD-10-CM | POA: Diagnosis not present

## 2022-05-12 ENCOUNTER — Other Ambulatory Visit (HOSPITAL_COMMUNITY): Payer: Self-pay

## 2022-05-12 NOTE — Progress Notes (Signed)
[place] Counselor/Therapist Progress Note  Patient ID: Alexandra Beck, MRN: [id number],    Date: 05/11/2022  Time Spent: 60 minutes  Treatment Type: Individual Therapy  Reported Symptoms: sadness,   Mental Status Exam: Appearance:  Casual     Behavior: Appropriate  Motor: Normal  Speech/Language:  Normal Rate  Affect: Appropriate  Mood: Pleasant  Thought process: normal  Thought content:   WNL  Sensory/Perceptual disturbances:   WNL  Orientation: oriented to person, place, time/date, and situation  Attention: Good  Concentration: Good  Memory: WNL  Fund of knowledge:  Good  Insight:   Good  Judgment:  Good  Impulse Control: Good   Risk Assessment: Danger to Self:  No Self-injurious Behavior: No Danger to Others: No Duty to Warn:no Physical Aggression / Violence:No  Access to Firearms a concern: No  Gang Involvement:No   Subjective: The patient attended a face-to-face individual therapy session today via video visit .  The patient gave verbal consent for the video visit to be on WebEx.  The patient was in her home alone and the therapist was in the office.   The patient presents as pleasant and cooperative.  The patient reports that she is doing well this week.  There has been nothing too difficult that she has had to deal with.  The patient seems to be doing better with her wife.  We did talk about the situation with her brother and the credit card that she got for him to use while he was in [place].  She is still having to pay some on the credit card that he ran up to more than $13,000.  We talked about how to set better limits with him around this circumstance.  The patient is continuing to make progress and is now showering at least twice a week.  Interventions: Cognitive Behavioral Therapy, Assertiveness/Communication, and Problem solving  Diagnosis:Major depressive disorder, recurrent episode, moderate (HCC)  Obsessional thoughts  Plan: Treatment  Plan  Strengths/Abilities:  Intelligent, ability for insight, supportive wife  Treatment Preferences:  Outpatient Individual therapy  Statement of Needs:  "I Need help with my depression and motivation. "  Symptoms:Depressed or irritable mood.:(Status: improved). Diminished interest in or  enjoyment of activities.(Status: improved). Feelings of hopelessness,  worthlessness, or inappropriate guilt.: (Status: improved). History of chronic or recurrent depression for which the client has taken antidepressant medication, been hospitalized, had  outpatient treatment, or had a course of electroconvulsive therapy.(Status:  maintained). Lack of energy.: (Status: maintained). Low self-esteem.:  (Status: maintained). Poor concentration and indecisiveness.:  (Status: improved). Social withdrawal.:  (Status: maintained).  Unresolved grief issues.: (Status: maintained).  Problems Addressed:  Unipolar Depression  Goals:  LTG:1. Alleviate depressive symptoms and return to previous level of effective  functioning.  60% 2. Appropriately grieve the loss in order to normalize mood and to return  to previously adaptive level of functioning. 3. Develop healthy interpersonal relationships that lead to the alleviation  and help prevent the relapse of depression.  60% 4. Develop healthy thinking patterns and beliefs about self, others, and the world that lead to the alleviation and help prevent the relapse of  Depression  60% STG:1.Describe current and past experiences with depression including their impact on functioning and  attempts to resolve it.  60 % 2. Identify and replace thoughts and beliefs that support depression.  50 % 3.Learn and implement behavioral strategies to overcome depression. 50% 4.Verbalize an understanding of healthy and unhealthy emotions with the intent of increasing the  use of  healthy emotions to guide actions.  30 %  Target Date:  10/07/2022 Frequency:   Weekly Modality:Individual Interventions by Therapist:  CBT, problem solving, EMDR, insight oriented  Patient approved Treatment Plan  [name] [name], LCSW                            [name] G [name], LCSW               [name] G [name], LCSW               [name], LCSW               [name], LCSW               [name], LCSW               [name], LCSW               [name], LCSW               [name], LCSW               [name], LCSW               [name], LCSW               [name], LCSW               [name], LCSW               [name], LCSW               [name], LCSW               [name], LCSW               [name], LCSW               [name], LCSW               [name], LCSW               [name], LCSW

## 2022-05-13 ENCOUNTER — Encounter (HOSPITAL_COMMUNITY): Payer: Self-pay

## 2022-05-13 ENCOUNTER — Inpatient Hospital Stay (HOSPITAL_COMMUNITY)
Admission: EM | Admit: 2022-05-13 | Discharge: 2022-05-17 | DRG: 193 | Disposition: A | Discharge status: Home / Self Care | Payer: BC Managed Care – PPO | Attending: Family Medicine | Admitting: Family Medicine

## 2022-05-13 ENCOUNTER — Ambulatory Visit (HOSPITAL_COMMUNITY)
Admission: EM | Admit: 2022-05-13 | Discharge: 2022-05-13 | Disposition: A | Discharge status: Home / Self Care | Payer: BC Managed Care – PPO

## 2022-05-13 ENCOUNTER — Emergency Department (HOSPITAL_COMMUNITY): Payer: BC Managed Care – PPO

## 2022-05-13 ENCOUNTER — Ambulatory Visit: Payer: BC Managed Care – PPO | Admitting: Psychology

## 2022-05-13 ENCOUNTER — Other Ambulatory Visit (HOSPITAL_COMMUNITY): Payer: Self-pay

## 2022-05-13 ENCOUNTER — Other Ambulatory Visit: Payer: Self-pay

## 2022-05-13 DIAGNOSIS — E785 Hyperlipidemia, unspecified: Secondary | ICD-10-CM | POA: Diagnosis present

## 2022-05-13 DIAGNOSIS — Z881 Allergy status to other antibiotic agents status: Secondary | ICD-10-CM

## 2022-05-13 DIAGNOSIS — R9431 Abnormal electrocardiogram [ECG] [EKG]: Secondary | ICD-10-CM

## 2022-05-13 DIAGNOSIS — E1159 Type 2 diabetes mellitus with other circulatory complications: Secondary | ICD-10-CM | POA: Diagnosis present

## 2022-05-13 DIAGNOSIS — Z6841 Body Mass Index (BMI) 40.0 and over, adult: Secondary | ICD-10-CM

## 2022-05-13 DIAGNOSIS — R0602 Shortness of breath: Secondary | ICD-10-CM | POA: Diagnosis present

## 2022-05-13 DIAGNOSIS — Z20822 Contact with and (suspected) exposure to covid-19: Secondary | ICD-10-CM | POA: Diagnosis present

## 2022-05-13 DIAGNOSIS — N179 Acute kidney failure, unspecified: Secondary | ICD-10-CM

## 2022-05-13 DIAGNOSIS — E039 Hypothyroidism, unspecified: Secondary | ICD-10-CM | POA: Diagnosis present

## 2022-05-13 DIAGNOSIS — R509 Fever, unspecified: Secondary | ICD-10-CM

## 2022-05-13 DIAGNOSIS — I872 Venous insufficiency (chronic) (peripheral): Secondary | ICD-10-CM | POA: Diagnosis present

## 2022-05-13 DIAGNOSIS — G8929 Other chronic pain: Secondary | ICD-10-CM | POA: Diagnosis not present

## 2022-05-13 DIAGNOSIS — E119 Type 2 diabetes mellitus without complications: Secondary | ICD-10-CM

## 2022-05-13 DIAGNOSIS — I5032 Chronic diastolic (congestive) heart failure: Secondary | ICD-10-CM | POA: Diagnosis present

## 2022-05-13 DIAGNOSIS — I10 Essential (primary) hypertension: Secondary | ICD-10-CM | POA: Diagnosis present

## 2022-05-13 DIAGNOSIS — I110 Hypertensive heart disease with heart failure: Secondary | ICD-10-CM | POA: Diagnosis present

## 2022-05-13 DIAGNOSIS — R42 Dizziness and giddiness: Secondary | ICD-10-CM

## 2022-05-13 DIAGNOSIS — Z87891 Personal history of nicotine dependence: Secondary | ICD-10-CM

## 2022-05-13 DIAGNOSIS — E662 Morbid (severe) obesity with alveolar hypoventilation: Secondary | ICD-10-CM | POA: Diagnosis present

## 2022-05-13 DIAGNOSIS — F329 Major depressive disorder, single episode, unspecified: Secondary | ICD-10-CM | POA: Diagnosis present

## 2022-05-13 DIAGNOSIS — Z7984 Long term (current) use of oral hypoglycemic drugs: Secondary | ICD-10-CM

## 2022-05-13 DIAGNOSIS — R0600 Dyspnea, unspecified: Secondary | ICD-10-CM | POA: Diagnosis present

## 2022-05-13 DIAGNOSIS — E559 Vitamin D deficiency, unspecified: Secondary | ICD-10-CM | POA: Diagnosis present

## 2022-05-13 DIAGNOSIS — E1169 Type 2 diabetes mellitus with other specified complication: Secondary | ICD-10-CM | POA: Diagnosis present

## 2022-05-13 DIAGNOSIS — J189 Pneumonia, unspecified organism: Secondary | ICD-10-CM | POA: Diagnosis not present

## 2022-05-13 DIAGNOSIS — Z8249 Family history of ischemic heart disease and other diseases of the circulatory system: Secondary | ICD-10-CM

## 2022-05-13 DIAGNOSIS — E669 Obesity, unspecified: Secondary | ICD-10-CM | POA: Diagnosis present

## 2022-05-13 DIAGNOSIS — Z833 Family history of diabetes mellitus: Secondary | ICD-10-CM

## 2022-05-13 DIAGNOSIS — J9601 Acute respiratory failure with hypoxia: Secondary | ICD-10-CM

## 2022-05-13 DIAGNOSIS — I152 Hypertension secondary to endocrine disorders: Secondary | ICD-10-CM | POA: Diagnosis present

## 2022-05-13 DIAGNOSIS — E038 Other specified hypothyroidism: Secondary | ICD-10-CM

## 2022-05-13 DIAGNOSIS — E782 Mixed hyperlipidemia: Secondary | ICD-10-CM

## 2022-05-13 DIAGNOSIS — G4733 Obstructive sleep apnea (adult) (pediatric): Secondary | ICD-10-CM | POA: Diagnosis present

## 2022-05-13 DIAGNOSIS — Z8349 Family history of other endocrine, nutritional and metabolic diseases: Secondary | ICD-10-CM

## 2022-05-13 DIAGNOSIS — E1122 Type 2 diabetes mellitus with diabetic chronic kidney disease: Secondary | ICD-10-CM | POA: Diagnosis present

## 2022-05-13 DIAGNOSIS — Z79890 Hormone replacement therapy (postmenopausal): Secondary | ICD-10-CM

## 2022-05-13 DIAGNOSIS — E876 Hypokalemia: Secondary | ICD-10-CM

## 2022-05-13 DIAGNOSIS — K219 Gastro-esophageal reflux disease without esophagitis: Secondary | ICD-10-CM | POA: Diagnosis present

## 2022-05-13 DIAGNOSIS — F32A Depression, unspecified: Secondary | ICD-10-CM | POA: Diagnosis present

## 2022-05-13 DIAGNOSIS — Z83438 Family history of other disorder of lipoprotein metabolism and other lipidemia: Secondary | ICD-10-CM

## 2022-05-13 DIAGNOSIS — Z79899 Other long term (current) drug therapy: Secondary | ICD-10-CM

## 2022-05-13 DIAGNOSIS — G894 Chronic pain syndrome: Secondary | ICD-10-CM | POA: Diagnosis present

## 2022-05-13 DIAGNOSIS — F3289 Other specified depressive episodes: Secondary | ICD-10-CM

## 2022-05-13 LAB — CREATININE, SERUM
Creatinine, Ser: 1.37 mg/dL — ABNORMAL HIGH (ref 0.44–1.00)
GFR, Estimated: 50 mL/min — ABNORMAL LOW (ref >60)

## 2022-05-13 LAB — URINALYSIS, ROUTINE W REFLEX MICROSCOPIC
Bilirubin Urine: NEGATIVE
Glucose, UA: NEGATIVE mg/dL
Ketones, ur: NEGATIVE mg/dL
Leukocytes,Ua: NEGATIVE
Nitrite: NEGATIVE
Protein, ur: NEGATIVE mg/dL
Specific Gravity, Urine: 1.016 (ref 1.005–1.030)
pH: 5 (ref 5.0–8.0)

## 2022-05-13 LAB — CBC WITH DIFFERENTIAL/PLATELET
Abs Immature Granulocytes: 0 10*3/uL (ref 0.00–0.07)
Basophils Absolute: 0 10*3/uL (ref 0.0–0.1)
Basophils Relative: 0 %
Eosinophils Absolute: 0 10*3/uL (ref 0.0–0.5)
Eosinophils Relative: 0 %
HCT: 37.8 % (ref 36.0–46.0)
Hemoglobin: 11.4 g/dL — ABNORMAL LOW (ref 12.0–15.0)
Lymphocytes Relative: 6 %
Lymphs Abs: 0.6 10*3/uL — ABNORMAL LOW (ref 0.7–4.0)
MCH: 26.5 pg (ref 26.0–34.0)
MCHC: 30.2 g/dL (ref 30.0–36.0)
MCV: 87.9 fL (ref 80.0–100.0)
Monocytes Absolute: 0.3 10*3/uL (ref 0.1–1.0)
Monocytes Relative: 3 %
Neutro Abs: 9.5 10*3/uL — ABNORMAL HIGH (ref 1.7–7.7)
Neutrophils Relative %: 91 %
Platelets: 194 10*3/uL (ref 150–400)
RBC: 4.3 MIL/uL (ref 3.87–5.11)
RDW: 16.9 % — ABNORMAL HIGH (ref 11.5–15.5)
WBC: 10.4 10*3/uL (ref 4.0–10.5)
nRBC: 0 % (ref 0.0–0.2)
nRBC: 0 /100 WBC

## 2022-05-13 LAB — CBC
HCT: 32.5 % — ABNORMAL LOW (ref 36.0–46.0)
Hemoglobin: 10 g/dL — ABNORMAL LOW (ref 12.0–15.0)
MCH: 27 pg (ref 26.0–34.0)
MCHC: 30.8 g/dL (ref 30.0–36.0)
MCV: 87.6 fL (ref 80.0–100.0)
Platelets: 173 10*3/uL (ref 150–400)
RBC: 3.71 MIL/uL — ABNORMAL LOW (ref 3.87–5.11)
RDW: 16.7 % — ABNORMAL HIGH (ref 11.5–15.5)
WBC: 10.9 10*3/uL — ABNORMAL HIGH (ref 4.0–10.5)
nRBC: 0 % (ref 0.0–0.2)

## 2022-05-13 LAB — LACTIC ACID, PLASMA
Lactic Acid, Venous: 3.2 mmol/L (ref 0.5–1.9)
Lactic Acid, Venous: 1.6 mmol/L (ref 0.5–1.9)

## 2022-05-13 LAB — RESP PANEL BY RT-PCR (FLU A&B, COVID) ARPGX2
Influenza A by PCR: NEGATIVE
Influenza B by PCR: NEGATIVE
SARS Coronavirus 2 by RT PCR: NEGATIVE

## 2022-05-13 LAB — MAGNESIUM: Magnesium: 1.8 mg/dL (ref 1.7–2.4)

## 2022-05-13 LAB — HEMOGLOBIN A1C
Hgb A1c MFr Bld: 5.8 % — ABNORMAL HIGH (ref 4.8–5.6)
Mean Plasma Glucose: 119.76 mg/dL

## 2022-05-13 LAB — BASIC METABOLIC PANEL
Anion gap: 12 (ref 5–15)
BUN: 24 mg/dL — ABNORMAL HIGH (ref 6–20)
CO2: 25 mmol/L (ref 22–32)
Calcium: 9.1 mg/dL (ref 8.9–10.3)
Chloride: 102 mmol/L (ref 98–111)
Creatinine, Ser: 1.41 mg/dL — ABNORMAL HIGH (ref 0.44–1.00)
GFR, Estimated: 48 mL/min — ABNORMAL LOW (ref >60)
Glucose, Bld: 141 mg/dL — ABNORMAL HIGH (ref 70–99)
Potassium: 3.3 mmol/L — ABNORMAL LOW (ref 3.5–5.1)
Sodium: 139 mmol/L (ref 135–145)

## 2022-05-13 LAB — PROCALCITONIN: Procalcitonin: 13.12 ng/mL

## 2022-05-13 LAB — I-STAT BETA HCG BLOOD, ED (MC, WL, AP ONLY): I-stat hCG, quantitative: <5 m[IU]/mL (ref <5)

## 2022-05-13 LAB — HIV ANTIBODY (ROUTINE TESTING W REFLEX): HIV Screen 4th Generation wRfx: NONREACTIVE

## 2022-05-13 LAB — TROPONIN I (HIGH SENSITIVITY)
Troponin I (High Sensitivity): 24 ng/L — ABNORMAL HIGH (ref <18)
Troponin I (High Sensitivity): 23 ng/L — ABNORMAL HIGH (ref <18)

## 2022-05-13 MED ORDER — MODAFINIL 200 MG PO TABS: 200.0000 mg | ORAL_TABLET | Freq: Every day | ORAL | Status: DC

## 2022-05-13 MED ORDER — ACETAMINOPHEN 325 MG PO TABS
650.0000 mg | ORAL_TABLET | Freq: Four times a day (QID) | ORAL | Status: DC | PRN
  Administered 2022-05-14 – 2022-05-16 (×4): 650 mg via ORAL
  Filled 2022-05-13 (×4): qty 2

## 2022-05-13 MED ORDER — BREXPIPRAZOLE 0.25 MG PO TABS
0.5000 mg | ORAL_TABLET | Freq: Every day | ORAL | Status: DC
Start: 2022-05-13 — End: 2022-05-18
  Administered 2022-05-14 – 2022-05-17 (×4): 0.5 mg via ORAL
  Filled 2022-05-13 (×6): qty 2

## 2022-05-13 MED ORDER — TRAMADOL HCL 50 MG PO TABS
50.0000 mg | ORAL_TABLET | Freq: Three times a day (TID) | ORAL | Status: DC | PRN
  Administered 2022-05-13 – 2022-05-14 (×2): 50 mg via ORAL
  Filled 2022-05-13 (×2): qty 1

## 2022-05-13 MED ORDER — SERTRALINE HCL 100 MG PO TABS
200.0000 mg | ORAL_TABLET | Freq: Every day | ORAL | Status: DC
Start: 2022-05-13 — End: 2022-05-18
  Administered 2022-05-14 – 2022-05-17 (×4): 200 mg via ORAL
  Filled 2022-05-13 (×4): qty 2

## 2022-05-13 MED ORDER — SPIRONOLACTONE 25 MG PO TABS
25.0000 mg | ORAL_TABLET | Freq: Every day | ORAL | Status: DC
  Administered 2022-05-14 – 2022-05-17 (×4): 25 mg via ORAL
  Filled 2022-05-13 (×5): qty 1

## 2022-05-13 MED ORDER — ROSUVASTATIN CALCIUM 5 MG PO TABS
10.0000 mg | ORAL_TABLET | Freq: Every day | ORAL | Status: DC
  Administered 2022-05-14 – 2022-05-17 (×4): 10 mg via ORAL
  Filled 2022-05-13 (×4): qty 2

## 2022-05-13 MED ORDER — METFORMIN HCL 500 MG PO TABS: 500.0000 mg | ORAL_TABLET | Freq: Every day | ORAL | Status: DC

## 2022-05-13 MED ORDER — SODIUM CHLORIDE 0.9 % IV SOLN
1.0000 g | INTRAVENOUS | Status: DC
  Administered 2022-05-13: 1 g via INTRAVENOUS
  Filled 2022-05-13: qty 10

## 2022-05-13 MED ORDER — IBUPROFEN 600 MG PO TABS
800.0000 mg | ORAL_TABLET | Freq: Four times a day (QID) | ORAL | Status: DC | PRN
Start: 2022-05-13 — End: 2022-05-18
  Administered 2022-05-13 – 2022-05-14 (×2): 800 mg via ORAL
  Filled 2022-05-13 (×2): qty 1

## 2022-05-13 MED ORDER — LEVOTHYROXINE SODIUM 75 MCG PO TABS
150.0000 ug | ORAL_TABLET | Freq: Every day | ORAL | Status: DC
  Administered 2022-05-14 – 2022-05-17 (×4): 150 ug via ORAL
  Filled 2022-05-13 (×4): qty 2

## 2022-05-13 MED ORDER — METOPROLOL SUCCINATE ER 25 MG PO TB24
25.0000 mg | ORAL_TABLET | Freq: Every day | ORAL | Status: DC
Start: 2022-05-13 — End: 2022-05-18
  Administered 2022-05-14 – 2022-05-17 (×4): 25 mg via ORAL
  Filled 2022-05-13 (×4): qty 1

## 2022-05-13 MED ORDER — CYCLOBENZAPRINE HCL 10 MG PO TABS
10.0000 mg | ORAL_TABLET | Freq: Three times a day (TID) | ORAL | Status: DC | PRN
Start: 2022-05-13 — End: 2022-05-18

## 2022-05-13 MED ORDER — MODAFINIL 100 MG PO TABS
100.0000 mg | ORAL_TABLET | Freq: Every day | ORAL | Status: DC
Start: 2022-05-13 — End: 2022-05-13

## 2022-05-13 MED ORDER — ENOXAPARIN SODIUM 40 MG/0.4ML IJ SOSY
40.0000 mg | PREFILLED_SYRINGE | INTRAMUSCULAR | Status: DC
  Administered 2022-05-13: 40 mg via SUBCUTANEOUS
  Filled 2022-05-13: qty 0.4

## 2022-05-13 MED ORDER — POTASSIUM CHLORIDE 20 MEQ PO PACK
20.0000 meq | PACK | Freq: Every day | ORAL | Status: DC
  Administered 2022-05-14: 20 meq via ORAL
  Filled 2022-05-13: qty 1

## 2022-05-13 MED ORDER — TORSEMIDE 20 MG PO TABS
40.0000 mg | ORAL_TABLET | Freq: Two times a day (BID) | ORAL | Status: DC
  Administered 2022-05-13 – 2022-05-15 (×5): 40 mg via ORAL
  Filled 2022-05-13 (×6): qty 2

## 2022-05-13 MED ORDER — MAGNESIUM SULFATE IN D5W 1-5 GM/100ML-% IV SOLN
1.0000 g | Freq: Once | INTRAVENOUS | Status: AC
  Administered 2022-05-13: 1 g via INTRAVENOUS
  Filled 2022-05-13 (×2): qty 100

## 2022-05-13 MED ORDER — MODAFINIL 100 MG PO TABS
300.0000 mg | ORAL_TABLET | Freq: Every day | ORAL | Status: DC
  Administered 2022-05-14 – 2022-05-17 (×4): 300 mg via ORAL
  Filled 2022-05-13 (×4): qty 3

## 2022-05-13 MED ORDER — INSULIN ASPART 100 UNIT/ML IJ SOLN
0.0000 [IU] | Freq: Three times a day (TID) | INTRAMUSCULAR | Status: DC
  Administered 2022-05-14: 2 [IU] via SUBCUTANEOUS

## 2022-05-13 MED ORDER — POTASSIUM CHLORIDE 20 MEQ PO PACK
40.0000 meq | PACK | Freq: Once | ORAL | Status: AC
Start: 2022-05-13 — End: 2022-05-13
  Administered 2022-05-13: 40 meq via ORAL
  Filled 2022-05-13: qty 2

## 2022-05-13 MED ORDER — SODIUM CHLORIDE 0.9 % IV BOLUS
3000.0000 mL | Freq: Once | INTRAVENOUS | Status: AC
  Administered 2022-05-13: 3000 mL via INTRAVENOUS

## 2022-05-13 NOTE — ED Provider Notes (Addendum)
[facility]    CSN: [id number] Arrival date & time: 05/13/22  1041      History   Chief Complaint Chief Complaint  Patient presents with   Dizziness   Shortness of Breath    HPI Leianne Callins Route is a 41 y.o. female presenting with fever of unknown origin.  History morbid obesity BMI greater than 70, CHF, renal failure, cellulitis, venous insufficiency.  Patient describes about 2 days of dyspnea on exertion, lightheadedness, fevers.  Has attempted acetaminophen this morning with minimal relief.  She denies acute pain or dyspnea at rest, denies known rashes, denies urinary symptoms or new flank pain, denies recent URI.   HPI  Past Medical History:  Diagnosis Date   Acute renal failure (ARF) ([place]) 11/2012    multifactorial-likely secondary to ATN in the setting of sepsis and hypotension, and also from rhabdomyolysis   Anemia    Cellulitis    Chronic diastolic CHF (congestive heart failure) ([place])    Depression    Diabetes mellitus ([place])    GERD (gastroesophageal reflux disease)    Hyperlipidemia    Hypertension    Hypothyroidism    Morbid obesity with BMI of 70 and over, adult ([place])    Obesity hypoventilation syndrome (HCC)    OSA (obstructive sleep apnea)    PVC's (premature ventricular contractions)    Recurrent cellulitis of lower leg    LLE, venous insuff   Sepsis ([place]) 11/2012   Secondary to cellulitis   Sleep apnea    Venous insufficiency of leg     Patient Active Problem List   Diagnosis Date Noted   Eczema, dyshidrotic 09/19/2021   Chronic pain 09/19/2021   Cellulitis and abscess of neck 05/14/2021   Diabetes mellitus ([place]) 10/19/2020   Psoriasis 12/26/2019   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   Hypersomnolence 05/11/2018   Vitamin D deficiency 05/02/2018   Cellulitis of left leg 04/17/2017   Cellulitis of right leg 01/11/2017   PVC's (premature ventricular contractions) 12/02/2016   Chronic  diastolic CHF (congestive heart failure) ([place])    Acute kidney injury ([place])    Acute dyspnea 08/05/2014   Hyperlipidemia    Diabetes mellitus type 2 in obese (HCC)    Hypothyroidism    GERD (gastroesophageal reflux disease)    Depression    Venous insufficiency of leg    Recurrent cellulitis of lower leg 12/25/2012   OSA (obstructive sleep apnea) 12/25/2012   Obesity hypoventilation syndrome ([place]) 12/25/2012   Class 3 severe obesity with serious comorbidity and body mass index (BMI) greater than or equal to 70 in adult [facility]) 12/24/2012   Essential hypertension 06/26/2007    Past Surgical History:  Procedure Laterality Date   IR FLUORO GUIDE CV MIDLINE PICC RIGHT  03/28/2017   IR US GUIDE VASC ACCESS RIGHT  03/28/2017   WISDOM TOOTH EXTRACTION      OB History     Gravida  0   Para  0   Term  0   Preterm  0   AB  0   Living  0      SAB  0   IAB  0   Ectopic  0   Multiple  0   Live Births  0            Home Medications    Prior to Admission medications   Medication Sig Start Date End Date Taking? Authorizing Provider  Brexpiprazole (REXULTI) 0.5 MG  TABS Take 1 tablet by mouth daily. 02/04/22   Mozingo, Berdie Ogren, NP  cefadroxil (DURICEF) 500 MG capsule Take 1 capsule (500 mg total) by mouth 2 (two) times daily. 10/06/21   [place] Circle, FNP  cyclobenzaprine (FLEXERIL) 10 MG tablet Take 1 tablet (10 mg total) by mouth 3 (three) times daily. 01/21/22   Glennon Mac, DO  ibuprofen (ADVIL) 200 MG tablet Take 1,600 mg by mouth every 6 (six) hours as needed for headache or moderate pain.    [provider]  levothyroxine (SYNTHROID) 150 MCG tablet Take 1 tablet (150 mcg total) by mouth daily before breakfast. 06/21/21   Marrian Salvage, FNP  losartan (COZAAR) 100 MG tablet Take 1 tablet (100 mg total) by mouth daily. 10/08/21   Jettie Booze, MD  metFORMIN (GLUCOPHAGE) 500 MG tablet TAKE 1 TABLET(500 MG) BY MOUTH DAILY 03/07/22    Marrian Salvage, FNP  metoprolol succinate (TOPROL-XL) 50 MG 24 hr tablet TAKE 1 TABLET BY MOUTH DAILY WITH OR IMMEDIATELY FOLLOWING A MEAL 03/07/22   Marrian Salvage, FNP  modafinil (PROVIGIL) 100 MG tablet Take '100mg'$  tablet by mouth daily at 9am along with 200 mg tablet daily at 9am (total '300mg'$ ) 04/29/22   Martyn Ehrich, NP  modafinil (PROVIGIL) 200 MG tablet Take '200mg'$  tablet by mouth daily at 9am along with 100 mg tablet daily at 9am (total '300mg'$ ) 04/29/22   Martyn Ehrich, NP  rosuvastatin (CRESTOR) 10 MG tablet TAKE 1 TABLET(10 MG) BY MOUTH DAILY 03/07/22   Marrian Salvage, FNP  Semaglutide, 1 MG/DOSE, 4 MG/3ML SOPN Inject 1 mg as directed once a week. 05/10/22   [place], Joneen Boers, FNP  sertraline (ZOLOFT) 100 MG tablet Take 1 tablet by mouth 2 times daily. 12/10/21   Mozingo, Berdie Ogren, NP  spironolactone (ALDACTONE) 25 MG tablet Take 1 tablet (25 mg total) by mouth daily. 03/14/22   Laqueta Linden, MD  torsemide (DEMADEX) 20 MG tablet Take 3 tablets (60 mg total) by mouth in the morning AND 2 tablets (40 mg total) every evening as needed 03/14/22   Laqueta Linden, MD  traMADol (ULTRAM) 50 MG tablet Take 1 tablet (50 mg total) by mouth every 8 (eight) hours as needed. Patient taking differently: Take 50 mg by mouth every 8 (eight) hours as needed for moderate pain. 09/15/21   Biagio Borg, MD  triamcinolone cream (KENALOG) 0.1 % Apply 1 application topically 2 (two) times daily. 09/15/21 09/15/22  Biagio Borg, MD    Family History Family History  Problem Relation Age of Onset   Hypertension Mother    Diabetes Mother    High Cholesterol Mother    Thyroid disease Mother    Sleep apnea Mother    Obesity Mother    Hypertension Father    Heart disease Father        before age 56   High Cholesterol Father    Sleep apnea Father    Obesity Father     Social History Social History   Tobacco Use   Smoking status: Former    Packs/day: 0.50     Years: 8.00    Total pack years: 4.00    Types: Cigarettes    Quit date: 2011    Years since quitting: 12.4   Smokeless tobacco: Never  Vaping Use   Vaping Use: Never used  Substance Use Topics   Alcohol use: No    Alcohol/week: 0.0 standard drinks of alcohol  Drug use: No     Allergies   Aspirin, Doxycycline, Lisinopril, Niacin and related, and Niaspan [niacin er]   Review of Systems Review of Systems  Constitutional:  Positive for fever.  All other systems reviewed and are negative.    Physical Exam Triage Vital Signs ED Triage Vitals  Enc Vitals Group     BP 05/13/22 1049 (!) 110/49     Pulse Rate 05/13/22 1049 93     Resp 05/13/22 1049 20     Temp 05/13/22 1049 (!) 102.2 F (39 C)     Temp Source 05/13/22 1049 Oral     SpO2 05/13/22 1049 95 %     Weight --      Height --      Head Circumference --      Peak Flow --      Pain Score 05/13/22 1050 0     Pain Loc --      Pain Edu? --      Excl. in [name]? --    No data found.  Updated Vital Signs BP (!) 110/49 (BP Location: Right Arm)   Pulse 93   Temp (!) 102.2 F (39 C) (Oral)   Resp 20   LMP 05/06/2022   SpO2 95%   Visual Acuity Right Eye Distance:   Left Eye Distance:   Bilateral Distance:    Right Eye Near:   Left Eye Near:    Bilateral Near:     Physical Exam Vitals reviewed.  Constitutional:      General: She is not in acute distress.    Appearance: Normal appearance. She is obese. She is not ill-appearing or diaphoretic.  HENT:     Head: Normocephalic and atraumatic.  Cardiovascular:     Rate and Rhythm: Normal rate and regular rhythm.     Heart sounds: Normal heart sounds.  Pulmonary:     Effort: Pulmonary effort is normal.     Breath sounds: Normal breath sounds.     Comments: Exam limited due to body habitus  Skin:    General: Skin is warm.  Neurological:     General: No focal deficit present.     Mental Status: She is alert and oriented to person, place, and time.   Psychiatric:        Mood and Affect: Mood normal.        Behavior: Behavior normal.        Thought Content: Thought content normal.        Judgment: Judgment normal.      UC Treatments / Results  Labs (all labs ordered are listed, but only abnormal results are displayed) Labs Reviewed - No data to display  EKG   Radiology No results found.  Procedures Procedures (including critical care time)  Medications Ordered in UC Medications - No data to display  Initial Impression / Assessment and Plan / UC Course  I have reviewed the triage vital signs and the nursing notes.  Pertinent labs & imaging results that were available during my care of the patient were reviewed by me and considered in my medical decision making (see chart for details).     This patient is a very pleasant 41 y.o. year old female presenting with fever of unknown origin. Temperature is 102.2 has attempted '2000mg'$  acetaminophen in the last 8 hours. Exam is limited due to body habitus. She denies urinary symptoms. There is some dyspnea but we are unable to perform imaging here given body habitus.  Sent to ED via POV, she is in agreement. Declines EMS multiple times, she is hemodynamically stable for transport in POV.   Level 5 as she was admitted.   Final Clinical Impressions(s) / UC Diagnoses   Final diagnoses:  Fever, unknown origin     Discharge Instructions      -sent to ED via POV, she is in agreement, declines transport via EMS    ED Prescriptions   None    PDMP not reviewed this encounter.   Hazel Sams, PA-C 05/13/22 1228    Hazel Sams, PA-C 05/13/22 1949

## 2022-05-13 NOTE — ED Provider Triage Note (Signed)
Emergency Medicine Provider Triage Evaluation Note  Alexandra Beck , a 41 y.o. female  was evaluated in triage.  Pt complains of fever onset last night.  Has associated shortness of breath, dizziness, sternal chest pain (worse with ambulation).  Took 2 g of Tylenol at 9 AM.  No abdominal pain, dysuria, hematuria. Denies noticing any areas of concern as far as cellulitis.  Notes that she typically has a sudden onset of fever when she has concerns for areas of cellulitis.   Review of Systems  Positive: As per HPI above Negative:   Physical Exam  BP 120/66   Pulse 88   Temp 100.3 F (37.9 C) (Oral)   Resp 18   LMP 05/06/2022   SpO2 93%  Gen:   Awake, no distress   Resp:  Normal effort  MSK:   Moves extremities without difficulty  Other:    Medical Decision Making  Medically screening exam initiated at 12:04 PM.  Appropriate orders placed.  Alexandra Beck was informed that the remainder of the evaluation will be completed by another provider, this initial triage assessment does not replace that evaluation, and the importance of remaining in the ED until their evaluation is complete.  Work-up initiated   Alexandra Beck A, PA-C 05/13/22 1210

## 2022-05-13 NOTE — ED Provider Notes (Signed)
[facility] EMERGENCY DEPARTMENT Provider Note   CSN: [id number] Arrival date & time: 05/13/22  1128     History  Chief Complaint  Patient presents with   Fever    Alexandra Beck is a 41 y.o. female.  HPI Patient presenting for evaluation of fever.  She also complains of shortness of breath with walking.  Using Tylenol for fever.  Patient with pertinent history of cellulitis, diabetes, psoriasis, narcolepsy, heart failure, renal insufficiency, obstructive sleep apnea.  She is followed closely by behavioral health for counseling of major depression.    Home Medications Prior to Admission medications   Medication Sig Start Date End Date Taking? Authorizing Provider  acetaminophen (TYLENOL) 500 MG tablet Take 1,000-2,000 mg by mouth every 6 (six) hours as needed for mild pain.   Yes [provider]  Brexpiprazole (REXULTI) 0.5 MG TABS Take 1 tablet by mouth daily. 02/04/22  Yes Mozingo, Berdie Ogren, NP  cefadroxil (DURICEF) 500 MG capsule Take 500 mg by mouth 2 (two) times daily as needed (for flare up of infection).   Yes [provider]  cyclobenzaprine (FLEXERIL) 10 MG tablet Take 1 tablet (10 mg total) by mouth 3 (three) times daily. Patient taking differently: Take 10 mg by mouth 3 (three) times daily as needed for muscle spasms. 01/21/22  Yes Glennon Mac, DO  ibuprofen (ADVIL) 200 MG tablet Take 800 mg by mouth every 6 (six) hours as needed for headache or moderate pain.   Yes [provider]  levothyroxine (SYNTHROID) 150 MCG tablet Take 1 tablet (150 mcg total) by mouth daily before breakfast. 06/21/21  Yes Marrian Salvage, FNP  losartan (COZAAR) 100 MG tablet Take 1 tablet (100 mg total) by mouth daily. 10/08/21  Yes Jettie Booze, MD  metFORMIN (GLUCOPHAGE) 500 MG tablet TAKE 1 TABLET(500 MG) BY MOUTH DAILY Patient taking differently: Take 500 mg by mouth daily with breakfast. Y 03/07/22  Yes Marrian Salvage, FNP  metoprolol succinate (TOPROL-XL) 50 MG 24 hr tablet TAKE 1 TABLET BY MOUTH DAILY WITH OR IMMEDIATELY FOLLOWING A MEAL Patient taking differently: Take 50 mg by mouth daily. 03/07/22  Yes Marrian Salvage, FNP  modafinil (PROVIGIL) 100 MG tablet Take '100mg'$  tablet by mouth daily at 9am along with 200 mg tablet daily at 9am (total '300mg'$ ) 04/29/22  Yes Martyn Ehrich, NP  modafinil (PROVIGIL) 200 MG tablet Take '200mg'$  tablet by mouth daily at 9am along with 100 mg tablet daily at 9am (total '300mg'$ ) 04/29/22  Yes Martyn Ehrich, NP  rosuvastatin (CRESTOR) 10 MG tablet TAKE 1 TABLET(10 MG) BY MOUTH DAILY Patient taking differently: Take 10 mg by mouth daily. 03/07/22  Yes Marrian Salvage, FNP  Semaglutide, 1 MG/DOSE, 4 MG/3ML SOPN Inject 1 mg as directed once a week. Patient taking differently: Inject 1 mg as directed once a week. Wednesday 05/10/22  Yes [place], Dawn W, FNP  sertraline (ZOLOFT) 100 MG tablet Take 1 tablet by mouth 2 times daily. Patient taking differently: Take 200 mg by mouth daily. 12/10/21  Yes Mozingo, Berdie Ogren, NP  spironolactone (ALDACTONE) 25 MG tablet Take 1 tablet (25 mg total) by mouth daily. 03/14/22  Yes Laqueta Linden, MD  torsemide (DEMADEX) 20 MG tablet Take 3 tablets (60 mg total) by mouth in the morning AND 2 tablets (40 mg total) every evening as needed Patient taking differently: Take 3 tablets (60 mg total) by mouth in the morning AND 2 tablets (40 mg total)  every evening as needed for fluid 03/14/22  Yes Laqueta Linden, MD  traMADol (ULTRAM) 50 MG tablet Take 1 tablet (50 mg total) by mouth every 8 (eight) hours as needed. Patient taking differently: Take 50 mg by mouth every 8 (eight) hours as needed for moderate pain. 09/15/21  Yes Biagio Borg, MD      Allergies    Aspirin, Doxycycline, Lisinopril, Niacin and related, and Niaspan [niacin er]    Review of Systems   Review of Systems  Physical Exam Updated Vital  Signs BP (!) 115/47   Pulse 90   Temp 100.3 F (37.9 C) (Oral)   Resp 15   LMP 05/06/2022   SpO2 96%  Physical Exam  ED Results / Procedures / Treatments   Labs (all labs ordered are listed, but only abnormal results are displayed) Labs Reviewed  BASIC METABOLIC PANEL - Abnormal; Notable for the following components:      Result Value   Potassium 3.3 (*)    Glucose, Bld 141 (*)    BUN 24 (*)    Creatinine, Ser 1.41 (*)    GFR, Estimated 48 (*)    All other components within normal limits  CBC WITH DIFFERENTIAL/PLATELET - Abnormal; Notable for the following components:   Hemoglobin 11.4 (*)    RDW 16.9 (*)    Neutro Abs 9.5 (*)    Lymphs Abs 0.6 (*)    All other components within normal limits  LACTIC ACID, PLASMA - Abnormal; Notable for the following components:   Lactic Acid, Venous 3.2 (*)    All other components within normal limits  TROPONIN I (HIGH SENSITIVITY) - Abnormal; Notable for the following components:   Troponin I (High Sensitivity) 24 (*)    All other components within normal limits  TROPONIN I (HIGH SENSITIVITY) - Abnormal; Notable for the following components:   Troponin I (High Sensitivity) 23 (*)    All other components within normal limits  CULTURE, BLOOD (ROUTINE X 2)  CULTURE, BLOOD (ROUTINE X 2)  LACTIC ACID, PLASMA  URINALYSIS, ROUTINE W REFLEX MICROSCOPIC  I-STAT BETA HCG BLOOD, ED ([facility], [facility], [facility] ONLY)    EKG EKG Interpretation  Date/Time:  Friday May 13 2022 11:40:41 EDT Ventricular Rate:  87 PR Interval:  128 QRS Duration: 82 QT Interval:  350 QTC Calculation: 421 R Axis:   13 Text Interpretation: Normal sinus rhythm Low voltage QRS T wave abnormality, consider inferolateral ischemia Abnormal ECG When compared with ECG of 18-Sep-2021 08:10, PREVIOUS ECG IS PRESENT No significant change was found Confirmed by Daleen Bo [phone]) on 05/13/2022 2:10:50 PM  Radiology DG Chest 2 View  Result Date: 05/13/2022 CLINICAL DATA:  Shortness of  breath, fever, dizziness EXAM: CHEST - 2 VIEW COMPARISON:  Previous studies including the examination of 09/09/2021 FINDINGS: Cardiac size is within normal limits. There are no signs of pulmonary edema. Subtle increased density in both lower lung fields may be an artifact due to chest wall attenuation. Less likely possibility would be early pneumonia. Cardiac and diaphragmatic margins are preserved. Costophrenic angles are clear. There is no pneumothorax. IMPRESSION: Subtle increased density in the lower lung fields may be an artifact due to chest wall attenuation. Less likely possibility would be early pneumonia. There are no signs of alveolar pulmonary edema. Electronically Signed   By: Elmer Picker M.D.   On: 05/13/2022 13:09    Procedures Procedures    Medications Ordered in ED Medications  sodium chloride 0.9 % bolus 3,000 mL (  3,000 mLs Intravenous New Bag/Given 05/13/22 1356)    ED Course/ Medical Decision Making/ A&P                           Medical Decision Making Patient presenting with ongoing respiratory symptoms for 1 month, worsening female with chills which started in the night.  Chest x-ray today indicates bibasilar hazy infiltrates.  Amount and/or Complexity of Data Reviewed External Data Reviewed: radiology, ECG and notes.    Details: Patient with history of diastolic heart failure, managed as an outpatient by her cardiologist.  Last cardiac echo, October 2022 had normal left ventricular systolic ejection fraction. Labs: ordered.    Details: CBC, metabolic panel, blood cultures, lactate-normal except potassium low, glucose high, BUN high, creatinine high, GFR low, hemoglobin low, lactate high, troponin high, unchanged over time. Radiology: ordered and independent interpretation performed.    Details: Chest x-ray-bilateral basal hazy infiltrates ECG/medicine tests: ordered and independent interpretation performed.    Details: Cardiac monitor-normal sinus  rhythm Discussion of management or test interpretation with external provider(s): Consult hospitalist arrange for admission.  Risk Prescription drug management. Decision regarding hospitalization. Risk Details: Patient presenting with chills, fever and some respiratory symptoms which are subacute and worsening.  Chest x-ray indicates bilateral lower lobe pneumonia.  Laboratory evaluation with potassium low, glucose high, BUN high, creatinine high, GFR low hemoglobin low, lactic acid high, high-sensitivity troponin high but unchanged over time, initial lactate high.  Patient was treated with high-volume saline bolus, 30 cc/kg.  She has a history of heart failure but recent left-ventricular ejection fraction normal.  She has multiple comorbidities.  History of cellulitis, diabetes, renal insufficiency, sleep apnea and using CPAP regularly.  She is a risk for decompensation if discharged.  Hospitalist consulted for admission.  Critical Care Total time providing critical care: 45 minutes           Final Clinical Impression(s) / ED Diagnoses Final diagnoses:  Community acquired pneumonia, unspecified laterality    Rx / DC Orders ED Discharge Orders     None         Daleen Bo, MD 05/13/22 1639

## 2022-05-13 NOTE — ED Notes (Signed)
NO PURPLE MAN

## 2022-05-13 NOTE — ED Notes (Signed)
Patient is being discharged from the Urgent Care and sent to the Emergency Department via POV . Per Laura,PA, patient is in need of higher level of care due to SOB and fever. Patient is aware and verbalizes understanding of plan of care.  Vitals:   05/13/22 1049  BP: (!) 110/49  Pulse: 93  Resp: 20  Temp: (!) 102.2 F (39 C)  SpO2: 95%

## 2022-05-13 NOTE — ED Notes (Signed)
No purple man  floor contacted

## 2022-05-13 NOTE — Discharge Instructions (Addendum)
-  sent to ED via POV, she is in agreement, declines transport via EMS

## 2022-05-13 NOTE — H&P (Addendum)
History and Physical    DOA: 05/13/2022  PCP: Biagio Borg, MD  Patient coming from: home  Chief Complaint: fevers, dyspnea  HPI: Alexandra Beck is a 41 y.o. female with history h/o morbid obesity, hypertension, hyperlipidemia, hypothyroidism, chronic diastolic CHF, chronic venous insufficiency of lower extremities, anemia, OSA/obesity hypoventilation syndrome, depression presents to the ED with complaints of dyspnea on exertion, fevers and dizziness for the last 2 days.  Patient is accompanied by her wife today in the ED.  Apparently wife was sick a month back with URI and states multiple colleagues at her workplace were also sick at that time although unclear etiology (denies anyone formally being diagnosed by any particular viral illness).  Shortly after that patient apparently had some sore throat and dry cough.  Cough subsequently progressed to productive nature with yellow phlegm over the last week.  She however reports noticing fever only yesterday and has had dyspnea on exertion.  Patient does have a history of diastolic CHF and was admitted previously in the hospital for that.  She states she did not have any chest discomfort at home but does feel somewhat "congested and pressure-like" after receiving IV fluid bolus in the ED (pointing towards lower chest and epigastric area).  She states she is usually hypertensive and takes blood pressure medicines at home, is worried about low normal BP here.  She did not take her antihypertensives this morning.  ED course: Febrile, Tmax 102.24F, pulse 74-93, respiratory rate 15, BP 108/62, O2 sat 97% on room air WBC 10.4, hemoglobin 11.4, hematocrit 37.8, platelet 194, potassium 3.3, bicarb 25, BUN 24, creatinine 1.41, calcium 9.1, at bedtime troponin elevated but stable at 23-24, BG 141, LA 3.2.  Patient received 2 L IV fluid bolus so far and her SBP is in 749S while diastolic BP is 49-67.  Chest x-ray revealed bibasilar atelectasis versus artifact and  less likely to be pneumonia.  EDP requested hospitalist admission for pneumonia treatment given clinical presentation and persistent fevers.  Respiratory panel/COVID testing pending at this time.   Review of Systems: As per HPI, otherwise review of systems negative.    Past Medical History:  Diagnosis Date   Acute renal failure (ARF) ([place]) 11/2012    multifactorial-likely secondary to ATN in the setting of sepsis and hypotension, and also from rhabdomyolysis   Anemia    Cellulitis    Chronic diastolic CHF (congestive heart failure) (HCC)    Depression    Diabetes mellitus (HCC)    GERD (gastroesophageal reflux disease)    Hyperlipidemia    Hypertension    Hypothyroidism    Morbid obesity with BMI of 70 and over, adult ([place])    Obesity hypoventilation syndrome (HCC)    OSA (obstructive sleep apnea)    PVC's (premature ventricular contractions)    Recurrent cellulitis of lower leg    LLE, venous insuff   Sepsis ([place]) 11/2012   Secondary to cellulitis   Sleep apnea    Venous insufficiency of leg     Past Surgical History:  Procedure Laterality Date   IR FLUORO GUIDE CV MIDLINE PICC RIGHT  03/28/2017   IR US GUIDE VASC ACCESS RIGHT  03/28/2017   WISDOM TOOTH EXTRACTION      Social history:  reports that she quit smoking about 12 years ago. Her smoking use included cigarettes. She has a 4.00 pack-year smoking history. She has never used smokeless tobacco. She reports that she does not drink alcohol and does not use drugs.  Allergies  Allergen Reactions   Aspirin Hives and Swelling    REACTION: throat swelling, hives Other reaction(s): Unknown Other reaction(s): Unknown   Doxycycline Other (See Comments)    Abdominal pain Other reaction(s): Unknown   Lisinopril Cough   Niacin And Related     Other reaction(s): Unknown Other reaction(s): Unknown   Niaspan [Niacin Er]     Caused flushing    Family History  Problem Relation Age of Onset   Hypertension Mother     Diabetes Mother    High Cholesterol Mother    Thyroid disease Mother    Sleep apnea Mother    Obesity Mother    Hypertension Father    Heart disease Father        before age 7   High Cholesterol Father    Sleep apnea Father    Obesity Father       Prior to Admission medications   Medication Sig Start Date End Date Taking? Authorizing Provider  acetaminophen (TYLENOL) 500 MG tablet Take 1,000-2,000 mg by mouth every 6 (six) hours as needed for mild pain.   Yes [provider]  Brexpiprazole (REXULTI) 0.5 MG TABS Take 1 tablet by mouth daily. 02/04/22  Yes Mozingo, Berdie Ogren, NP  cefadroxil (DURICEF) 500 MG capsule Take 500 mg by mouth 2 (two) times daily as needed (for flare up of infection).   Yes [provider]  cyclobenzaprine (FLEXERIL) 10 MG tablet Take 1 tablet (10 mg total) by mouth 3 (three) times daily. Patient taking differently: Take 10 mg by mouth 3 (three) times daily as needed for muscle spasms. 01/21/22  Yes Glennon Mac, DO  ibuprofen (ADVIL) 200 MG tablet Take 800 mg by mouth every 6 (six) hours as needed for headache or moderate pain.   Yes [provider]  levothyroxine (SYNTHROID) 150 MCG tablet Take 1 tablet (150 mcg total) by mouth daily before breakfast. 06/21/21  Yes Marrian Salvage, FNP  losartan (COZAAR) 100 MG tablet Take 1 tablet (100 mg total) by mouth daily. 10/08/21  Yes Jettie Booze, MD  metFORMIN (GLUCOPHAGE) 500 MG tablet TAKE 1 TABLET(500 MG) BY MOUTH DAILY Patient taking differently: Take 500 mg by mouth daily with breakfast. Y 03/07/22  Yes Marrian Salvage, FNP  metoprolol succinate (TOPROL-XL) 50 MG 24 hr tablet TAKE 1 TABLET BY MOUTH DAILY WITH OR IMMEDIATELY FOLLOWING A MEAL Patient taking differently: Take 50 mg by mouth daily. 03/07/22  Yes Marrian Salvage, FNP  modafinil (PROVIGIL) 100 MG tablet Take '100mg'$  tablet by mouth daily at 9am along with 200 mg tablet daily at 9am (total  '300mg'$ ) 04/29/22  Yes Martyn Ehrich, NP  modafinil (PROVIGIL) 200 MG tablet Take '200mg'$  tablet by mouth daily at 9am along with 100 mg tablet daily at 9am (total '300mg'$ ) 04/29/22  Yes Martyn Ehrich, NP  rosuvastatin (CRESTOR) 10 MG tablet TAKE 1 TABLET(10 MG) BY MOUTH DAILY Patient taking differently: Take 10 mg by mouth daily. 03/07/22  Yes Marrian Salvage, FNP  Semaglutide, 1 MG/DOSE, 4 MG/3ML SOPN Inject 1 mg as directed once a week. Patient taking differently: Inject 1 mg as directed once a week. Wednesday 05/10/22  Yes Whitmire, Dawn W, FNP  sertraline (ZOLOFT) 100 MG tablet Take 1 tablet by mouth 2 times daily. Patient taking differently: Take 200 mg by mouth daily. 12/10/21  Yes Mozingo, Berdie Ogren, NP  spironolactone (ALDACTONE) 25 MG tablet Take 1 tablet (25 mg total) by mouth daily. 03/14/22  Yes  Laqueta Linden, MD  torsemide (DEMADEX) 20 MG tablet Take 3 tablets (60 mg total) by mouth in the morning AND 2 tablets (40 mg total) every evening as needed Patient taking differently: Take 3 tablets (60 mg total) by mouth in the morning AND 2 tablets (40 mg total) every evening as needed for fluid 03/14/22  Yes Laqueta Linden, MD  traMADol (ULTRAM) 50 MG tablet Take 1 tablet (50 mg total) by mouth every 8 (eight) hours as needed. Patient taking differently: Take 50 mg by mouth every 8 (eight) hours as needed for moderate pain. 09/15/21  Yes Biagio Borg, MD    Physical Exam: Vitals:   05/13/22 1612 05/13/22 1630 05/13/22 1700 05/13/22 1730  BP: (!) 112/37 (!) 116/42 (!) 122/46 (!) 113/92  Pulse: 92 79 86 80  Resp: '17 18 18 18  '$ Temp: 99.7 F (37.6 C)     TempSrc:      SpO2: 97% 100% 90% 98%    Constitutional: Morbidly obese female laying in stretcher bed, no obvious distress.  Eyes: PERRL, lids and conjunctivae normal ENMT: Mucous membranes are moist. Posterior pharynx clear of any exudate or lesions.Normal dentition.  Neck: normal, supple, no masses, no  thyromegaly Respiratory: clear to auscultation bilaterally although decreased breath sounds at bases with poor inspiratory effort, no wheezing, no crackles. Normal respiratory effort. No accessory muscle use.  Cardiovascular: Regular rate and rhythm, no murmurs / rubs / gallops.  Chronic 2+ lower extremity edema. 2+ pedal pulses. No carotid bruits.  No reproducible chest pain,?  Tenderness along fifth costochondral junction (not consistent) Abdomen: Obese, +epigastric tenderness, no masses palpated. No hepatosplenomegaly. Bowel sounds positive.  Musculoskeletal: no clubbing / cyanosis. No joint deformity upper and lower extremities. Good ROM, no contractures. Normal muscle tone.  Neurologic: CN 2-12 grossly intact. Sensation intact, DTR normal. Strength 5/5 in all 4.  Psychiatric: Normal judgment and insight. Alert and oriented x 3. Normal mood.  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 Recent Labs  Lab 05/13/22 1221 05/13/22 1608  WBC 10.4  --   LATICACIDVEN 3.2* 1.6   Liver Function Tests: No results for input(s): "AST", "ALT", "ALKPHOS", "BILITOT", "PROT", "ALBUMIN" in the last 168 hours. No results for input(s): "LIPASE", "AMYLASE" in the last 168 hours. No results for input(s): "AMMONIA" in the last 168 hours. Coagulation Profile: No results for input(s): "INR", "PROTIME" in the last 168 hours. Cardiac Enzymes: No results for input(s): "CKTOTAL", "CKMB", "CKMBINDEX", "TROPONINI" in the last 168 hours. BNP (last 3 results) No results for  input(s): "PROBNP" in the last 8760 hours. HbA1C: No results for input(s): "HGBA1C" in the last 72 hours. CBG: No results for input(s): "GLUCAP" in the last 168 hours. Lipid Profile: No results for input(s): "CHOL", "HDL", "LDLCALC", "TRIG", "CHOLHDL", "LDLDIRECT" in the last 72 hours. Thyroid Function Tests: No results for input(s): "TSH", "T4TOTAL", "FREET4", "T3FREE", "THYROIDAB" in the last 72 hours. Anemia Panel: No results for input(s): "VITAMINB12", "FOLATE", "FERRITIN", "TIBC", "IRON", "RETICCTPCT" in the  last 72 hours. Urine analysis:    Component Value Date/Time   COLORURINE STRAW (A) 04/16/2017 0003   APPEARANCEUR CLEAR 04/16/2017 0003   LABSPEC 1.010 04/16/2017 0003   PHURINE 6.0 04/16/2017 0003   GLUCOSEU >=500 (A) 04/16/2017 0003   HGBUR SMALL (A) 04/16/2017 0003   BILIRUBINUR NEGATIVE 04/16/2017 0003   KETONESUR NEGATIVE 04/16/2017 0003   PROTEINUR 30 (A) 04/16/2017 0003   UROBILINOGEN 0.2 07/25/2014 1040   NITRITE NEGATIVE 04/16/2017 0003   LEUKOCYTESUR NEGATIVE 04/16/2017 0003    Radiological Exams on Admission: Personally reviewed  DG Chest 2 View  Result Date: 05/13/2022 CLINICAL DATA:  Shortness of breath, fever, dizziness EXAM: CHEST - 2 VIEW COMPARISON:  Previous studies including the examination of 09/09/2021 FINDINGS: Cardiac size is within normal limits. There are no signs of pulmonary edema. Subtle increased density in both lower lung fields may be an artifact due to chest wall attenuation. Less likely possibility would be early pneumonia. Cardiac and diaphragmatic margins are preserved. Costophrenic angles are clear. There is no pneumothorax. IMPRESSION: Subtle increased density in the lower lung fields may be an artifact due to chest wall attenuation. Less likely possibility would be early pneumonia. There are no signs of alveolar pulmonary edema. Electronically Signed   By: Elmer Picker M.D.   On: 05/13/2022 13:09    EKG: Independently reviewed.   Low voltage, nonspecific flattening of T waves in anterior-lat leads,    Assessment and Plan:   Principal Problem:   Pneumonia Active Problems:   Essential hypertension   Class 3 severe obesity with serious comorbidity and body mass index (BMI) greater than or equal to 70 in adult (HCC)   OSA (obstructive sleep apnea)   Obesity hypoventilation syndrome (HCC)   Hyperlipidemia   Diabetes mellitus type 2 in obese (HCC)   Hypothyroidism   GERD (gastroesophageal reflux disease)   Depression   Acute dyspnea   Chronic diastolic CHF (congestive heart failure) (HCC)   Vitamin D deficiency   Diabetes mellitus (HCC)   Chronic pain    1.  Fevers/upper respiratory symptoms: Secondary to acute viral illness versus community-acquired pneumonia.  Chest x-ray findings inconclusive.  Given fever, productive cough will treat with IV antibiotics for bronchitis/pneumonia.  Patient has doxycycline intolerance.  Will avoid fluoroquinolones with prolonged QT interval.  Repeat chest x-ray-PA/lat in AM.  Follow-up viral panel and blood culture results.  Procalcitonin level ordered.  2.  Mild AKI: Baseline creatinine around 1.1 and currently at 1.4.  Patient did receive fluid boluses in the ED.  Since she is complaining of chest heaviness and congestion since she got fluids, will hold off on further IV hydration.  Encourage oral fluid intake.  Resume home diuretics and repeat labs in a.m.  3.  Chest discomfort: Patient pointing to lower chest and epigastric area, has some reproducibility and reports mostly feeling heavy after receiving fluids.  Will avoid overhydration with history of CHF.  Repeat EKG especially with slightly elevated, although stable, troponins and complains of exertional dyspnea on presentation.  Echo ordered Repeat [id number] to prior with non specific T wave flattening but borderline prolonged QTc at 506 ms  4.  Chronic diastolic CHF, obesity hypoventilation syndrome, OSA,: Not on any home  O2.  Resume home diuretics.  Resume home CPAP (79m Hg).  Watch for signs of fluid overload  5.  Hypokalemia: In the setting of diuretic use.  Replace as needed  6.  Hypertension: Blood pressure low normal on presentation.  Improved now with  IV hydration.  Resume metoprolol at a lower dose, hold other BP meds for now and reintroduce when appropriate.  7.  Hypothyroidism: Resume home medications   8.  Prolonged QTc: Avoid QT prolonging agents.  Check magnesium and replace as needed.  Correct potassium level.  9. Diabetes Mellitus: Patient denied this history to me but noted on problem list and noted to be on metformin at home.  Resume metformin when creatinine better.  Will add SSI and check hemoglobin A1c while here.  Diabetic diet.  10. GERD, depression, vitamin D deficiency, chronic pain syndrome: Resume home medications  11. Morbid obesity: likely contributing to ventilation capacity and CXR findings. Follow up PCP regarding calorie control and weight loss strategies.  DVT prophylaxis: Lovenox  COVID screen: Pending  Code Status: Full code   .Health care proxy would be wife Ms. [name] Warren Lacy  Patient/Family Communication: Discussed with patient and all questions answered to satisfaction.  Consults called: None Admission status :Patient will be admitted under OBSERVATION status.The patient's presenting symptoms, physical exam findings, and initial radiographic and laboratory data in the context of their medical condition is felt to place them at low risk for further clinical deterioration. Furthermore, it is anticipated that the patient will be medically stable for discharge from the hospital within 2 midnights of hospital stay.       Guilford Shi MD [facility] Pager in [place]  If 7PM-7AM, please contact night-coverage [email or web address]   05/13/2022, 6:14 PM

## 2022-05-13 NOTE — ED Triage Notes (Signed)
Pt c/o dizziness, SOB, and fever last night. Took tylenol '2000mg'$  this morning. States hx of cellulitis and these are usually the sx's but no pain or redness noted anywhere. No distress, SOB on exertion. Speaking in complete sentences.

## 2022-05-13 NOTE — ED Notes (Signed)
Pt in room with MD c/o CP, fever, and dizziness at home. Pt A&O in room. No signs of distress. Pain at a 5/10 with a HA. No visual changes or photophobia

## 2022-05-13 NOTE — ED Triage Notes (Signed)
Complains of sob, dizziness and high fever.  Since last night took tylenol around 9am.  Denies CP Abd or urinary symptoms.

## 2022-05-14 ENCOUNTER — Observation Stay (HOSPITAL_COMMUNITY): Payer: BC Managed Care – PPO

## 2022-05-14 DIAGNOSIS — Z8349 Family history of other endocrine, nutritional and metabolic diseases: Secondary | ICD-10-CM | POA: Diagnosis not present

## 2022-05-14 DIAGNOSIS — R9431 Abnormal electrocardiogram [ECG] [EKG]: Secondary | ICD-10-CM

## 2022-05-14 DIAGNOSIS — E785 Hyperlipidemia, unspecified: Secondary | ICD-10-CM | POA: Diagnosis present

## 2022-05-14 DIAGNOSIS — F329 Major depressive disorder, single episode, unspecified: Secondary | ICD-10-CM | POA: Diagnosis present

## 2022-05-14 DIAGNOSIS — Z833 Family history of diabetes mellitus: Secondary | ICD-10-CM | POA: Diagnosis not present

## 2022-05-14 DIAGNOSIS — E559 Vitamin D deficiency, unspecified: Secondary | ICD-10-CM | POA: Diagnosis present

## 2022-05-14 DIAGNOSIS — E662 Morbid (severe) obesity with alveolar hypoventilation: Secondary | ICD-10-CM | POA: Diagnosis present

## 2022-05-14 DIAGNOSIS — J189 Pneumonia, unspecified organism: Secondary | ICD-10-CM | POA: Diagnosis present

## 2022-05-14 DIAGNOSIS — I5032 Chronic diastolic (congestive) heart failure: Secondary | ICD-10-CM | POA: Diagnosis present

## 2022-05-14 DIAGNOSIS — J9601 Acute respiratory failure with hypoxia: Secondary | ICD-10-CM

## 2022-05-14 DIAGNOSIS — Z83438 Family history of other disorder of lipoprotein metabolism and other lipidemia: Secondary | ICD-10-CM | POA: Diagnosis not present

## 2022-05-14 DIAGNOSIS — Z79899 Other long term (current) drug therapy: Secondary | ICD-10-CM | POA: Diagnosis not present

## 2022-05-14 DIAGNOSIS — E876 Hypokalemia: Secondary | ICD-10-CM

## 2022-05-14 DIAGNOSIS — K219 Gastro-esophageal reflux disease without esophagitis: Secondary | ICD-10-CM | POA: Diagnosis present

## 2022-05-14 DIAGNOSIS — E039 Hypothyroidism, unspecified: Secondary | ICD-10-CM | POA: Diagnosis present

## 2022-05-14 DIAGNOSIS — I110 Hypertensive heart disease with heart failure: Secondary | ICD-10-CM | POA: Diagnosis present

## 2022-05-14 DIAGNOSIS — Z8249 Family history of ischemic heart disease and other diseases of the circulatory system: Secondary | ICD-10-CM | POA: Diagnosis not present

## 2022-05-14 DIAGNOSIS — Z6841 Body Mass Index (BMI) 40.0 and over, adult: Secondary | ICD-10-CM | POA: Diagnosis not present

## 2022-05-14 DIAGNOSIS — Z20822 Contact with and (suspected) exposure to covid-19: Secondary | ICD-10-CM | POA: Diagnosis present

## 2022-05-14 DIAGNOSIS — R509 Fever, unspecified: Secondary | ICD-10-CM

## 2022-05-14 DIAGNOSIS — R42 Dizziness and giddiness: Secondary | ICD-10-CM

## 2022-05-14 DIAGNOSIS — Z87891 Personal history of nicotine dependence: Secondary | ICD-10-CM | POA: Diagnosis not present

## 2022-05-14 DIAGNOSIS — Z7984 Long term (current) use of oral hypoglycemic drugs: Secondary | ICD-10-CM | POA: Diagnosis not present

## 2022-05-14 DIAGNOSIS — R0602 Shortness of breath: Secondary | ICD-10-CM | POA: Diagnosis present

## 2022-05-14 DIAGNOSIS — G894 Chronic pain syndrome: Secondary | ICD-10-CM | POA: Diagnosis present

## 2022-05-14 DIAGNOSIS — N179 Acute kidney failure, unspecified: Secondary | ICD-10-CM | POA: Diagnosis present

## 2022-05-14 DIAGNOSIS — Z79890 Hormone replacement therapy (postmenopausal): Secondary | ICD-10-CM | POA: Diagnosis not present

## 2022-05-14 DIAGNOSIS — E119 Type 2 diabetes mellitus without complications: Secondary | ICD-10-CM | POA: Diagnosis present

## 2022-05-14 LAB — BASIC METABOLIC PANEL WITH GFR
Anion gap: 11 (ref 5–15)
BUN: 20 mg/dL (ref 6–20)
CO2: 25 mmol/L (ref 22–32)
Calcium: 8.5 mg/dL — ABNORMAL LOW (ref 8.9–10.3)
Chloride: 101 mmol/L (ref 98–111)
Creatinine, Ser: 1.32 mg/dL — ABNORMAL HIGH (ref 0.44–1.00)
GFR, Estimated: 52 mL/min — ABNORMAL LOW
Glucose, Bld: 149 mg/dL — ABNORMAL HIGH (ref 70–99)
Potassium: 3.5 mmol/L (ref 3.5–5.1)
Sodium: 137 mmol/L (ref 135–145)

## 2022-05-14 LAB — GLUCOSE, CAPILLARY
Glucose-Capillary: 116 mg/dL — ABNORMAL HIGH (ref 70–99)
Glucose-Capillary: 147 mg/dL — ABNORMAL HIGH (ref 70–99)
Glucose-Capillary: 102 mg/dL — ABNORMAL HIGH (ref 70–99)
Glucose-Capillary: 137 mg/dL — ABNORMAL HIGH (ref 70–99)

## 2022-05-14 LAB — ECHOCARDIOGRAM COMPLETE
Height: 62 in
S' Lateral: 4.1 cm
Weight: 7216.98 oz

## 2022-05-14 LAB — MAGNESIUM: Magnesium: 1.9 mg/dL (ref 1.7–2.4)

## 2022-05-14 MED ORDER — SODIUM CHLORIDE 0.9 % IV SOLN
2.0000 g | INTRAVENOUS | Status: DC
  Administered 2022-05-14 – 2022-05-15 (×2): 2 g via INTRAVENOUS
  Filled 2022-05-14 (×3): qty 20

## 2022-05-14 MED ORDER — AZITHROMYCIN 250 MG PO TABS
500.0000 mg | ORAL_TABLET | Freq: Every day | ORAL | Status: DC
  Administered 2022-05-14 – 2022-05-17 (×4): 500 mg via ORAL
  Filled 2022-05-14 (×4): qty 2

## 2022-05-14 MED ORDER — ENOXAPARIN SODIUM 80 MG/0.8ML IJ SOSY
80.0000 mg | PREFILLED_SYRINGE | INTRAMUSCULAR | Status: DC
  Administered 2022-05-14 – 2022-05-16 (×3): 80 mg via SUBCUTANEOUS
  Filled 2022-05-14 (×3): qty 0.8

## 2022-05-14 MED ORDER — PERFLUTREN LIPID MICROSPHERE
1.0000 mL | INTRAVENOUS | Status: AC | PRN
  Administered 2022-05-14: 7 mL via INTRAVENOUS

## 2022-05-14 NOTE — Assessment & Plan Note (Signed)
Nightly CPAP 

## 2022-05-14 NOTE — Assessment & Plan Note (Addendum)
CT chest concerning for edema Echo with EF 55-60%, IVC normal in size with 50% resp variability Resume home torsemide at discharge spirionolactone, metoprolol Losartan  Follow outpatient

## 2022-05-14 NOTE — Plan of Care (Signed)

## 2022-05-14 NOTE — Progress Notes (Signed)
PROGRESS NOTE    Alexandra Beck  JFH:[id number] DOB: [birth year] DOA: 05/13/2022 PCP: Biagio Borg, MD  Chief Complaint  Patient presents with   Fever    Brief Narrative:   Alexandra Beck is Alexandra Beck 41 y.o. female with history h/o morbid obesity, hypertension, hyperlipidemia, hypothyroidism, chronic diastolic CHF, chronic venous insufficiency of lower extremities, anemia, OSA/obesity hypoventilation syndrome, depression presents to the ED with complaints of dyspnea on exertion, fevers and dizziness for the last 2 days.  Patient is accompanied by her wife today in the ED.  Apparently wife was sick Alexandra Beck month back with URI and states multiple colleagues at her workplace were also sick at that time although unclear etiology (denies anyone formally being diagnosed by any particular viral illness).  Shortly after that patient apparently had some sore throat and dry cough.  Cough subsequently progressed to productive nature with yellow phlegm over the last week.  She however reports noticing fever only yesterday and has had dyspnea on exertion.  Patient does have Alexandra Beck history of diastolic CHF and was admitted previously in the hospital for that.  She states she did not have any chest discomfort at home but does feel somewhat "congested and pressure-like" after receiving IV fluid bolus in the ED (pointing towards lower chest and epigastric area).  She states she is usually hypertensive and takes blood pressure medicines at home, is worried about low normal BP here.  She did not take her antihypertensives this morning.   ED course: Febrile, Tmax 102.52F, pulse 74-93, respiratory rate 15, BP 108/62, O2 sat 97% on room air WBC 10.4, hemoglobin 11.4, hematocrit 37.8, platelet 194, potassium 3.3, bicarb 25, BUN 24, creatinine 1.41, calcium 9.1, at bedtime troponin elevated but stable at 23-24, BG 141, LA 3.2.  Patient received 2 L IV fluid bolus so far and her SBP is in 937D while diastolic BP is 42-87.  Chest x-ray revealed  bibasilar atelectasis versus artifact and less likely to be pneumonia.  EDP requested hospitalist admission for pneumonia treatment given clinical presentation and persistent fevers.  Respiratory panel/COVID testing pending at this time.    Assessment & Plan:   Principal Problem:   Pneumonia Active Problems:   Acute dyspnea   Acute respiratory failure with hypoxia (HCC)   Fever   AKI (acute kidney injury) ([place])   Lightheadedness   OSA (obstructive sleep apnea)   Obesity hypoventilation syndrome (HCC)   Chronic diastolic CHF (congestive heart failure) (HCC)   Hypokalemia   Essential hypertension   Hypothyroidism   Diabetes mellitus (HCC)   GERD (gastroesophageal reflux disease)   Depression   Vitamin D deficiency   Chronic pain   Obesity, morbid, BMI 50 or higher (HCC)   Prolonged QT interval   Hyperlipidemia   Diabetes mellitus type 2 in obese (HCC)   Assessment and Plan: Acute dyspnea Treating for presumed pneumonia in setting of recent URI symptoms desats to 88% today with activity CXR with increased density in lower lung fields CAP coverage Additional imaging if worsening if not improving    Fever Improved today Negative covid, influenza RVP pending Blood cultures pending UA with many bacteria, 6-10 wbc, doesn't appear c/w UTI with negative nitrite/LE Antibiotics for CAP  Lightheadedness Follow orthostatics  AKI (acute kidney injury) ([place]) Unclear recent baseline Follow on home diuretics Losartan currently on hold  Chronic diastolic CHF (congestive heart failure) (HCC) Continue home torsemide, spirionolactone, metoprolol Losartan on hold  Obesity hypoventilation syndrome (HCC) Nightly CPAP  OSA (obstructive sleep  apnea) CPAP nightly  Hypokalemia Replace and follow  Hypothyroidism synthroid  Essential hypertension Spironolactone, metoprolol, torsemide Losartan on hold  Diabetes mellitus ([place]) SSI Follow a1c] Hold metformin while  inpatient  Prolonged QT interval improved  Obesity, morbid, BMI 50 or higher ([place]) noted      DVT prophylaxis: lovenox Code Status: full Family Communication: none, wife over phone on speaker Disposition:   Status is: Observation The patient will require care spanning > 2 midnights and should be moved to inpatient because: continued hypoxia, need for additional IV abx   Consultants:  none  Procedures:  none  Antimicrobials:  Anti-infectives (From admission, onward)    Start     Dose/Rate Route Frequency Ordered Stop   05/14/22 1830  cefTRIAXone (ROCEPHIN) 2 g in sodium chloride 0.9 % 100 mL IVPB        2 g 200 mL/hr over 30 Minutes Intravenous Every 24 hours 05/14/22 1400     05/14/22 1615  azithromycin (ZITHROMAX) tablet 500 mg        500 mg Oral Daily 05/14/22 1530 05/19/22 0959   05/13/22 1830  cefTRIAXone (ROCEPHIN) 1 g in sodium chloride 0.9 % 100 mL IVPB  Status:  Discontinued        1 g 200 mL/hr over 30 Minutes Intravenous Every 24 hours 05/13/22 1754 05/14/22 1400       Subjective: C/o lightheadedness and SOB/cough Some improvement since yesterday, but not back to baseline  Objective: Vitals:   05/14/22 0000 05/14/22 0554 05/14/22 0803 05/14/22 0849  BP: (!) 98/46 (!) 108/42 (!) 120/49 128/68  Pulse: 80 74 76 84  Resp: '20 20 20   '$ Temp: 97.8 F (36.6 C) 97.8 F (36.6 C) 98.3 F (36.8 C)   TempSrc: Oral Oral Oral   SpO2: 90% 90% (!) 89%   Weight:  (!) 204.6 kg    Height:        Intake/Output Summary (Last 24 hours) at 05/14/2022 1550 Last data filed at 05/14/2022 1300 Gross per 24 hour  Intake 1300 ml  Output 1950 ml  Net -650 ml   Filed Weights   05/13/22 2000 05/14/22 0554  Weight: (!) 204.8 kg (!) 204.6 kg    Examination:  General exam: Appears calm and comfortable, obese Respiratory system: Clear to auscultation. Respiratory effort normal. Cardiovascular system: S1 & S2 heard, RRR.  Gastrointestinal system: Abdomen is  nondistended, soft and nontender. Central nervous system: Alert and oriented. No focal neurological deficits. Extremities: no pitting edema    Data Reviewed: I have personally reviewed following labs and imaging studies  CBC: Recent Labs  Lab 05/13/22 1221 05/13/22 1846  WBC 10.4 10.9*  NEUTROABS 9.5*  --   HGB 11.4* 10.0*  HCT 37.8 32.5*  MCV 87.9 87.6  PLT 194 474    Basic Metabolic Panel: Recent Labs  Lab 05/13/22 1221 05/13/22 1846 05/14/22 1120  NA 139  --  137  K 3.3*  --  3.5  CL 102  --  101  CO2 25  --  25  GLUCOSE 141*  --  149*  BUN 24*  --  20  CREATININE 1.41* 1.37* 1.32*  CALCIUM 9.1  --  8.5*  MG  --  1.8 1.9    GFR: Estimated Creatinine Clearance: 99.1 mL/min (Claudis Giovanelli) (by C-G formula based on SCr of 1.32 mg/dL (H)).  Liver Function Tests: No results for input(s): "AST", "ALT", "ALKPHOS", "BILITOT", "PROT", "ALBUMIN" in the last 168 hours.  CBG: Recent Labs  Lab  05/14/22 0840 05/14/22 1132  GLUCAP 116* 147*     Recent Results (from the past 240 hour(s))  Culture, blood (routine x 2)     Status: None (Preliminary result)   Collection Time: 05/13/22 12:22 PM   Specimen: BLOOD  Result Value Ref Range Status   Specimen Description BLOOD LEFT ANTECUBITAL  Final   Special Requests   Final    BOTTLES DRAWN AEROBIC AND ANAEROBIC Blood Culture adequate volume   Culture   Final    NO GROWTH < 24 HOURS Performed at [facility], [place] [address] [place], [place] [address]    Report Status PENDING  Incomplete  Culture, blood (routine x 2)     Status: None (Preliminary result)   Collection Time: 05/13/22  1:48 PM   Specimen: BLOOD  Result Value Ref Range Status   Specimen Description BLOOD SITE NOT SPECIFIED  Final   Special Requests   Final    BOTTLES DRAWN AEROBIC AND ANAEROBIC Blood Culture adequate volume   Culture   Final    NO GROWTH < 24 HOURS Performed at [facility], [place] [address], [place], [place] [address]    Report  Status PENDING  Incomplete  Resp Panel by RT-PCR (Flu Tyrelle Raczka&B, Covid) Anterior Nasal Swab     Status: None   Collection Time: 05/13/22  6:08 PM   Specimen: Anterior Nasal Swab  Result Value Ref Range Status   SARS Coronavirus 2 by RT PCR NEGATIVE NEGATIVE Final    Comment: (NOTE) SARS-CoV-2 target nucleic acids are NOT DETECTED.  The SARS-CoV-2 RNA is generally detectable in upper respiratory specimens during the acute phase of infection. The lowest concentration of SARS-CoV-2 viral copies this assay can detect is 138 copies/mL. Venita Seng negative result does not preclude SARS-Cov-2 infection and should not be used as the sole basis for treatment or other patient management decisions. Debra Calabretta negative result may occur with  improper specimen collection/handling, submission of specimen other than nasopharyngeal swab, presence of viral mutation(s) within the areas targeted by this assay, and inadequate number of viral copies(<138 copies/mL). Katarzyna Wolven negative result must be combined with clinical observations, patient history, and epidemiological information. The expected result is Negative.  Fact Sheet for Patients:  EntrepreneurPulse.com.au  Fact Sheet for Healthcare Providers:  IncredibleEmployment.be  This test is no t yet approved or cleared by the [place] FDA and  has been authorized for detection and/or diagnosis of SARS-CoV-2 by FDA under an Emergency Use Authorization (EUA). This EUA will remain  in effect (meaning this test can be used) for the duration of the COVID-19 declaration under Section 564(b)(1) of the Act, 21 U.S.C.section 360bbb-3(b)(1), unless the authorization is terminated  or revoked sooner.       Influenza Denilson Salminen by PCR NEGATIVE NEGATIVE Final   Influenza B by PCR NEGATIVE NEGATIVE Final    Comment: (NOTE) The Xpert Xpress SARS-CoV-2/FLU/RSV plus assay is intended as an aid in the diagnosis of influenza from Nasopharyngeal swab specimens  and should not be used as Kathryne Ramella sole basis for treatment. Nasal washings and aspirates are unacceptable for Xpert Xpress SARS-CoV-2/FLU/RSV testing.  Fact Sheet for Patients: EntrepreneurPulse.com.au  Fact Sheet for Healthcare Providers: IncredibleEmployment.be  This test is not yet approved or cleared by the [place] FDA and has been authorized for detection and/or diagnosis of SARS-CoV-2 by FDA under an Emergency Use Authorization (EUA). This EUA will remain in effect (meaning this test can be used) for the duration of the COVID-19 declaration  under Section 564(b)(1) of the Act, 21 U.S.C. section 360bbb-3(b)(1), unless the authorization is terminated or revoked.  Performed at [facility], [place] [address], [place], [place] [address]          Radiology Studies: DG Chest 2 View  Result Date: 05/13/2022 CLINICAL DATA:  Shortness of breath, fever, dizziness EXAM: CHEST - 2 VIEW COMPARISON:  Previous studies including the examination of 09/09/2021 FINDINGS: Cardiac size is within normal limits. There are no signs of pulmonary edema. Subtle increased density in both lower lung fields may be an artifact due to chest wall attenuation. Less likely possibility would be early pneumonia. Cardiac and diaphragmatic margins are preserved. Costophrenic angles are clear. There is no pneumothorax. IMPRESSION: Subtle increased density in the lower lung fields may be an artifact due to chest wall attenuation. Less likely possibility would be early pneumonia. There are no signs of alveolar pulmonary edema. Electronically Signed   By: Elmer Picker M.D.   On: 05/13/2022 13:09        Scheduled Meds:  azithromycin  500 mg Oral Daily   brexpiprazole  0.5 mg Oral Daily   enoxaparin (LOVENOX) injection  80 mg Subcutaneous Q24H   insulin aspart  0-15 Units Subcutaneous TID WC   levothyroxine  150 mcg Oral QAC breakfast   metoprolol succinate  25 mg Oral  Daily   modafinil  300 mg Oral Daily   rosuvastatin  10 mg Oral Daily   sertraline  200 mg Oral Daily   spironolactone  25 mg Oral Daily   torsemide  40 mg Oral BID   Continuous Infusions:  cefTRIAXone (ROCEPHIN)  IV       LOS: 0 days    Time spent: over 30 min    Fayrene Helper, MD [facility]   To contact the attending provider between 7A-7P or the covering provider during after hours 7P-7A, please log into the web site [email or web address] and access using universal [place] password for that web site. If you do not have the password, please call the hospital operator.  05/14/2022, 3:50 PM

## 2022-05-14 NOTE — Hospital Course (Signed)
[name] is Alexandra Beck 41 y.o. female with history h/o morbid obesity, hypertension, hyperlipidemia, hypothyroidism, chronic diastolic CHF, chronic venous insufficiency of lower extremities, anemia, OSA/obesity hypoventilation syndrome, depression presents to the ED with complaints of dyspnea on exertion, fevers and dizziness for the last 2 days.  Patient is accompanied by her wife today in the ED.  Apparently wife was sick Vyla Pint month back with URI and states multiple colleagues at her workplace were also sick at that time although unclear etiology (denies anyone formally being diagnosed by any particular viral illness).  Shortly after that patient apparently had some sore throat and dry cough.  Cough subsequently progressed to productive nature with yellow phlegm over the last week.  She however reports noticing fever only yesterday and has had dyspnea on exertion.  Patient does have Eshani Maestre history of diastolic CHF and was admitted previously in the hospital for that.  She was admitted for fever and shortness of breath and has been treated for pneumonia.  CT PE protocol was concerning for edema, given IV lasix, then transitioned back to her home torsemide.  She's gradually improving on discharge, plan for discharge with outpatient follow up.  She's requiring O2 with activity at this time.  See below for additional details

## 2022-05-14 NOTE — Assessment & Plan Note (Addendum)
Recurrent  Negative covid, influenza RVP negative Blood cultures NGx3 Repeat cultures [place]cal, inflammatory markers UA with many bacteria, 6-10 wbc, doesn't appear c/w UTI with negative nitrite/LE Continue for CAP

## 2022-05-14 NOTE — Assessment & Plan Note (Addendum)
resolved 

## 2022-05-14 NOTE — Progress Notes (Signed)
  Echocardiogram 2D Echocardiogram has been performed.  Alexandra Beck 05/14/2022, 4:38 PM

## 2022-05-14 NOTE — Assessment & Plan Note (Signed)
improved

## 2022-05-14 NOTE — Assessment & Plan Note (Signed)
synthroid °

## 2022-05-14 NOTE — Assessment & Plan Note (Signed)
noted 

## 2022-05-14 NOTE — TOC Initial Note (Signed)
Transition of Care [facility]) - Initial/Assessment Note    Patient Details  Name: Alexandra Beck MRN: [id number] Date of Birth: 14-Nov-1981  [facility]) CM/SW Contact:    Carles Collet, RN Phone Number: 05/14/2022, 2:54 PM  Clinical Narrative:               Alexandra Beck w patient over the phone. She states that she has CPAP with oxygen bled in through [facility]. Discussed her qualifying oxygen sats and potential need for home oxygen. Clarified with MD that he plans to keep her until tomorrow and wean off oxygen. Updated patient that we can hopefully DC tomorrow w/o home oxygen, but if she needs it we can set it up with [facility]. Patient appreciative.  No other home needs identified, of note she does not have a nebulizer.      Expected Discharge Plan: Home/Self Care Barriers to Discharge: Continued Medical Work up   Patient Goals and CMS Choice Patient states their goals for this hospitalization and ongoing recovery are:: to go home      Expected Discharge Plan and Services Expected Discharge Plan: Home/Self Care                                              Prior Living Arrangements/Services   Lives with:: Spouse                   Activities of Daily Living Home Assistive Devices/Equipment: CPAP ADL Screening (condition at time of admission) Patient's cognitive ability adequate to safely complete daily activities?: Yes Is the patient deaf or have difficulty hearing?: No Does the patient have difficulty seeing, even when wearing glasses/contacts?: No Does the patient have difficulty concentrating, remembering, or making decisions?: No Patient able to express need for assistance with ADLs?: Yes Does the patient have difficulty dressing or bathing?: No Independently performs ADLs?: Yes (appropriate for developmental age) Does the patient have difficulty walking or climbing stairs?: No Weakness of Legs: None Weakness of Arms/Hands: None  Permission  Sought/Granted                  Emotional Assessment              Admission diagnosis:  Pneumonia [J18.9] Community acquired pneumonia, unspecified laterality [J18.9] Patient Active Problem List   Diagnosis Date Noted   Pneumonia 05/13/2022   Eczema, dyshidrotic 09/19/2021   Chronic pain 09/19/2021   Cellulitis and abscess of neck 05/14/2021   Diabetes mellitus ([place]) 10/19/2020   Psoriasis 12/26/2019   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   Hypersomnolence 05/11/2018   Vitamin D deficiency 05/02/2018   Cellulitis of left leg 04/17/2017   Cellulitis of right leg 01/11/2017   PVC's (premature ventricular contractions) 12/02/2016   Chronic diastolic CHF (congestive heart failure) ([place])    Acute kidney injury ([place])    Acute dyspnea 08/05/2014   Hyperlipidemia    Diabetes mellitus type 2 in obese ([place])    Hypothyroidism    GERD (gastroesophageal reflux disease)    Depression    Venous insufficiency of leg    Recurrent cellulitis of lower leg 12/25/2012   OSA (obstructive sleep apnea) 12/25/2012   Obesity hypoventilation syndrome ([place]) 12/25/2012   Class 3 severe obesity with serious comorbidity and body mass index (BMI) greater than or equal to 70 in adult (  [place]) 12/24/2012   Essential hypertension 06/26/2007   PCP:  Biagio Borg, MD Pharmacy:   [facility] [place] [address] Phone: [phone] Fax: [phone]     Social Determinants of Health (SDOH) Interventions    Readmission Risk Interventions     No data to display

## 2022-05-14 NOTE — Assessment & Plan Note (Signed)
Replace and follow. ?

## 2022-05-14 NOTE — Progress Notes (Signed)
SATURATION QUALIFICATIONS: (This note is used to comply with regulatory documentation for home oxygen)  Patient Saturations on Room Air at Rest = 90%  Patient Saturations on Room Air while Ambulating = 88%  Patient Saturations on 2 Liters of oxygen while Ambulating = 90%  Please briefly explain why patient needs home oxygen: patient desaturated and was dizzy while walking on room air. Alexandra Beck [place]

## 2022-05-14 NOTE — Assessment & Plan Note (Addendum)
Unclear recent baseline Follow on home diuretics Losartan currently on hold

## 2022-05-14 NOTE — Assessment & Plan Note (Addendum)
SSI a1c 5.8 Resume metformin while inpatient

## 2022-05-14 NOTE — Assessment & Plan Note (Signed)
Spironolactone, metoprolol, torsemide Resume losartan

## 2022-05-14 NOTE — Assessment & Plan Note (Addendum)
CPAP nightly Uses 3 L at night at home in CPAP

## 2022-05-15 ENCOUNTER — Inpatient Hospital Stay (HOSPITAL_COMMUNITY): Payer: BC Managed Care – PPO

## 2022-05-15 DIAGNOSIS — J189 Pneumonia, unspecified organism: Secondary | ICD-10-CM | POA: Diagnosis not present

## 2022-05-15 LAB — COMPREHENSIVE METABOLIC PANEL
ALT: 29 U/L (ref 0–44)
AST: 41 U/L (ref 15–41)
Albumin: 2.9 g/dL — ABNORMAL LOW (ref 3.5–5.0)
Alkaline Phosphatase: 83 U/L (ref 38–126)
Anion gap: 12 (ref 5–15)
BUN: 23 mg/dL — ABNORMAL HIGH (ref 6–20)
CO2: 24 mmol/L (ref 22–32)
Calcium: 8.2 mg/dL — ABNORMAL LOW (ref 8.9–10.3)
Chloride: 101 mmol/L (ref 98–111)
Creatinine, Ser: 1.33 mg/dL — ABNORMAL HIGH (ref 0.44–1.00)
GFR, Estimated: 52 mL/min — ABNORMAL LOW (ref >60)
Glucose, Bld: 141 mg/dL — ABNORMAL HIGH (ref 70–99)
Potassium: 3.4 mmol/L — ABNORMAL LOW (ref 3.5–5.1)
Sodium: 137 mmol/L (ref 135–145)
Total Bilirubin: 0.5 mg/dL (ref 0.3–1.2)
Total Protein: 6.6 g/dL (ref 6.5–8.1)

## 2022-05-15 LAB — CBC WITH DIFFERENTIAL/PLATELET
Abs Immature Granulocytes: 0.03 10*3/uL (ref 0.00–0.07)
Basophils Absolute: 0 10*3/uL (ref 0.0–0.1)
Basophils Relative: 0 %
Eosinophils Absolute: 0.2 10*3/uL (ref 0.0–0.5)
Eosinophils Relative: 3 %
HCT: 31.2 % — ABNORMAL LOW (ref 36.0–46.0)
Hemoglobin: 9.9 g/dL — ABNORMAL LOW (ref 12.0–15.0)
Immature Granulocytes: 1 %
Lymphocytes Relative: 15 %
Lymphs Abs: 0.8 10*3/uL (ref 0.7–4.0)
MCH: 27.1 pg (ref 26.0–34.0)
MCHC: 31.7 g/dL (ref 30.0–36.0)
MCV: 85.5 fL (ref 80.0–100.0)
Monocytes Absolute: 0.5 10*3/uL (ref 0.1–1.0)
Monocytes Relative: 10 %
Neutro Abs: 3.7 10*3/uL (ref 1.7–7.7)
Neutrophils Relative %: 71 %
Platelets: 162 10*3/uL (ref 150–400)
RBC: 3.65 MIL/uL — ABNORMAL LOW (ref 3.87–5.11)
RDW: 16.8 % — ABNORMAL HIGH (ref 11.5–15.5)
WBC: 5.2 10*3/uL (ref 4.0–10.5)
nRBC: 0 % (ref 0.0–0.2)

## 2022-05-15 LAB — PHOSPHORUS: Phosphorus: 2.9 mg/dL (ref 2.5–4.6)

## 2022-05-15 LAB — RESPIRATORY PANEL BY PCR

## 2022-05-15 LAB — GLUCOSE, CAPILLARY
Glucose-Capillary: 126 mg/dL — ABNORMAL HIGH (ref 70–99)
Glucose-Capillary: 155 mg/dL — ABNORMAL HIGH (ref 70–99)
Glucose-Capillary: 115 mg/dL — ABNORMAL HIGH (ref 70–99)
Glucose-Capillary: 140 mg/dL — ABNORMAL HIGH (ref 70–99)

## 2022-05-15 LAB — MAGNESIUM: Magnesium: 2 mg/dL (ref 1.7–2.4)

## 2022-05-15 MED ORDER — IOHEXOL 350 MG/ML SOLN
100.0000 mL | Freq: Once | INTRAVENOUS | Status: AC | PRN
  Administered 2022-05-15: 100 mL via INTRAVENOUS

## 2022-05-15 MED ORDER — LORAZEPAM 2 MG/ML IJ SOLN: 0.5000 mg | Freq: Once | INTRAMUSCULAR | Status: AC

## 2022-05-15 MED ORDER — LORAZEPAM 2 MG/ML IJ SOLN
0.5000 mg | Freq: Once | INTRAMUSCULAR | Status: AC
Start: 2022-05-15 — End: 2022-05-15
  Administered 2022-05-15: 0.5 mg via INTRAVENOUS
  Filled 2022-05-15: qty 1

## 2022-05-15 MED ORDER — LORAZEPAM 2 MG/ML IJ SOLN
1.0000 mg | Freq: Once | INTRAMUSCULAR | Status: AC | PRN
Start: 2022-05-15 — End: 2022-05-15
  Administered 2022-05-15: 1 mg via INTRAVENOUS
  Filled 2022-05-15: qty 1

## 2022-05-15 MED ORDER — POTASSIUM CHLORIDE CRYS ER 20 MEQ PO TBCR
40.0000 meq | EXTENDED_RELEASE_TABLET | Freq: Once | ORAL | Status: AC
  Administered 2022-05-15: 40 meq via ORAL
  Filled 2022-05-15: qty 2

## 2022-05-15 NOTE — Progress Notes (Signed)
PROGRESS NOTE    Alexandra Beck  WUJ:[id number] DOB: March 03, 1981 DOA: 05/13/2022 PCP: Biagio Borg, MD  Chief Complaint  Patient presents with   Fever    Brief Narrative:   Alexandra Beck is Alexandra Beck 41 y.o. female with history h/o morbid obesity, hypertension, hyperlipidemia, hypothyroidism, chronic diastolic CHF, chronic venous insufficiency of lower extremities, anemia, OSA/obesity hypoventilation syndrome, depression presents to the ED with complaints of dyspnea on exertion, fevers and dizziness for the last 2 days.  Patient is accompanied by her wife today in the ED.  Apparently wife was sick Alexandra Beck month back with URI and states multiple colleagues at her workplace were also sick at that time although unclear etiology (denies anyone formally being diagnosed by any particular viral illness).  Shortly after that patient apparently had some sore throat and dry cough.  Cough subsequently progressed to productive nature with yellow phlegm over the last week.  She however reports noticing fever only yesterday and has had dyspnea on exertion.  Patient does have Alexandra Beck history of diastolic CHF and was admitted previously in the hospital for that.  She states she did not have any chest discomfort at home but does feel somewhat "congested and pressure-like" after receiving IV fluid bolus in the ED (pointing towards lower chest and epigastric area).  She states she is usually hypertensive and takes blood pressure medicines at home, is worried about low normal BP here.  She did not take her antihypertensives this morning.   ED course: Febrile, Tmax 102.9F, pulse 74-93, respiratory rate 15, BP 108/62, O2 sat 97% on room air WBC 10.4, hemoglobin 11.4, hematocrit 37.8, platelet 194, potassium 3.3, bicarb 25, BUN 24, creatinine 1.41, calcium 9.1, at bedtime troponin elevated but stable at 23-24, BG 141, LA 3.2.  Patient received 2 L IV fluid bolus so far and her SBP is in 956O while diastolic BP is 13-08.  Chest x-ray revealed  bibasilar atelectasis versus artifact and less likely to be pneumonia.  EDP requested hospitalist admission for pneumonia treatment given clinical presentation and persistent fevers.  Respiratory panel/COVID testing pending at this time.    Assessment & Plan:   Principal Problem:   Pneumonia Active Problems:   Acute dyspnea   Acute respiratory failure with hypoxia (HCC)   Fever   AKI (acute kidney injury) (Alexandra Beck)   Lightheadedness   OSA (obstructive sleep apnea)   Obesity hypoventilation syndrome (HCC)   Chronic diastolic CHF (congestive heart failure) (HCC)   Hypokalemia   Essential hypertension   Hypothyroidism   Diabetes mellitus (HCC)   GERD (gastroesophageal reflux disease)   Depression   Vitamin D deficiency   Chronic pain   Obesity, morbid, BMI 50 or higher (HCC)   Prolonged QT interval   Hyperlipidemia   Diabetes mellitus type 2 in obese (HCC)   Shortness of breath   Assessment and Plan: Acute dyspnea Treating for presumed pneumonia in setting of recent URI symptoms desats as low as 82% today with activity after discussion with RN CXR with low lung volumes and mild pulm vascular congestion CT PE protocol pending CAP coverage Additional imaging if worsening if not improving    Fever Improved today Negative covid, influenza RVP negative Blood cultures NGx2 UA with many bacteria, 6-10 wbc, doesn't appear c/w UTI with negative nitrite/LE Antibiotics for CAP  Lightheadedness Improved today Due to resp illness?  AKI (acute kidney injury) (Alexandra Beck) Unclear recent baseline Follow on home diuretics Losartan currently on hold  Chronic diastolic CHF (congestive heart  failure) (Alexandra Beck) Continue home torsemide, spirionolactone, metoprolol Losartan on hold  Obesity hypoventilation syndrome (HCC) Nightly CPAP  OSA (obstructive sleep apnea) CPAP nightly Uses 3 L at night at home in CPAP  Hypokalemia Replace and follow  Hypothyroidism synthroid  Essential  hypertension Spironolactone, metoprolol, torsemide Losartan on hold  Diabetes mellitus (Alexandra Beck) SSI a1c 5.8 Hold metformin while inpatient  Prolonged QT interval improved  Obesity, morbid, BMI 50 or higher (Alexandra Beck) noted      DVT prophylaxis: lovenox Code Status: full Family Communication: none, wife over phone on speaker Disposition:   Status is: Observation The patient will require care spanning > 2 midnights and should be moved to inpatient because: continued hypoxia, need for additional IV abx   Consultants:  none  Procedures:  none  Antimicrobials:  Anti-infectives (From admission, onward)    Start     Dose/Rate Route Frequency Ordered Stop   05/14/22 1830  cefTRIAXone (ROCEPHIN) 2 g in sodium chloride 0.9 % 100 mL IVPB        2 g 200 mL/hr over 30 Minutes Intravenous Every 24 hours 05/14/22 1400     05/14/22 1730  azithromycin (ZITHROMAX) tablet 500 mg        500 mg Oral Daily 05/14/22 1530 05/19/22 0959   05/13/22 1830  cefTRIAXone (ROCEPHIN) 1 g in sodium chloride 0.9 % 100 mL IVPB  Status:  Discontinued        1 g 200 mL/hr over 30 Minutes Intravenous Every 24 hours 05/13/22 1754 05/14/22 1400       Subjective: C/o lightheadedness and SOB/cough Some improvement since yesterday, but not back to baseline  Objective: Vitals:   05/14/22 2337 05/15/22 0535 05/15/22 0827 05/15/22 1124  BP: (!) 121/52 125/68 113/62 (!) 105/47  Pulse: 71 100 85 79  Resp: '18 18 18 20  '$ Temp: 99.5 F (37.5 C) 99.6 F (37.6 C) 99.9 F (37.7 C) 99.9 F (37.7 C)  TempSrc: Oral Oral Oral Oral  SpO2: 91% 91% 92% 92%  Weight:  (!) 203 kg    Height:        Intake/Output Summary (Last 24 hours) at 05/15/2022 1414 Last data filed at 05/15/2022 1259 Gross per 24 hour  Intake 1084 ml  Output 1800 ml  Net -716 ml   Filed Weights   05/13/22 2000 05/14/22 0554 05/15/22 0535  Weight: (!) 204.8 kg (!) 204.6 kg (!) 203 kg    Examination:  General: No acute distress. Obese.  Seen standing grooming herself at sink, but too SOB doing this to speak to me. Cardiovascular: RRR Lungs: unable to speak with me until she sat down in bed, distant breath sounds Neurological: Alert and oriented 3. Moves all extremities 4 with equal strength. Cranial nerves II through XII grossly intact. Extremities: No clubbing or cyanosis. No edema.    Data Reviewed: I have personally reviewed following labs and imaging studies  CBC: Recent Labs  Lab 05/13/22 1221 05/13/22 1846 05/15/22 0341  WBC 10.4 10.9* 5.2  NEUTROABS 9.5*  --  3.7  HGB 11.4* 10.0* 9.9*  HCT 37.8 32.5* 31.2*  MCV 87.9 87.6 85.5  PLT 194 173 606    Basic Metabolic Panel: Recent Labs  Lab 05/13/22 1221 05/13/22 1846 05/14/22 1120 05/15/22 0341  NA 139  --  137 137  K 3.3*  --  3.5 3.4*  CL 102  --  101 101  CO2 25  --  25 24  GLUCOSE 141*  --  149* 141*  BUN  24*  --  20 23*  CREATININE 1.41* 1.37* 1.32* 1.33*  CALCIUM 9.1  --  8.5* 8.2*  MG  --  1.8 1.9 2.0  PHOS  --   --   --  2.9    GFR: Estimated Creatinine Clearance: 97.8 mL/min (Alexandra Beck) (by C-G formula based on SCr of 1.33 mg/dL (H)).  Liver Function Tests: Recent Labs  Lab 05/15/22 0341  AST 41  ALT 29  ALKPHOS 83  BILITOT 0.5  PROT 6.6  ALBUMIN 2.9*    CBG: Recent Labs  Lab 05/14/22 1132 05/14/22 1639 05/14/22 2115 05/15/22 0633 05/15/22 1121  GLUCAP 147* 102* 137* 126* 155*     Recent Results (from the past 240 hour(s))  Culture, blood (routine x 2)     Status: None (Preliminary result)   Collection Time: 05/13/22 12:22 PM   Specimen: BLOOD  Result Value Ref Range Status   Specimen Description BLOOD LEFT ANTECUBITAL  Final   Special Requests   Final    BOTTLES DRAWN AEROBIC AND ANAEROBIC Blood Culture adequate volume   Culture   Final    NO GROWTH 2 DAYS Performed at [facility], [place] [address], [place], [place] [address]    Report Status PENDING  Incomplete  Culture, blood (routine x 2)     Status:  None (Preliminary result)   Collection Time: 05/13/22  1:48 PM   Specimen: BLOOD  Result Value Ref Range Status   Specimen Description BLOOD SITE NOT SPECIFIED  Final   Special Requests   Final    BOTTLES DRAWN AEROBIC AND ANAEROBIC Blood Culture adequate volume   Culture   Final    NO GROWTH 2 DAYS Performed at [facility], [address], [place], [place] [address]    Report Status PENDING  Incomplete  Resp Panel by RT-PCR (Flu Alexandra Beck&B, Covid) Anterior Nasal Swab     Status: None   Collection Time: 05/13/22  6:08 PM   Specimen: Anterior Nasal Swab  Result Value Ref Range Status   SARS Coronavirus 2 by RT PCR NEGATIVE NEGATIVE Final    Comment: (NOTE) SARS-CoV-2 target nucleic acids are NOT DETECTED.  The SARS-CoV-2 RNA is generally detectable in upper respiratory specimens during the acute phase of infection. The lowest concentration of SARS-CoV-2 viral copies this assay can detect is 138 copies/mL. Udell Mazzocco negative result does not preclude SARS-Cov-2 infection and should not be used as the sole basis for treatment or other patient management decisions. Ellyanna Holton negative result may occur with  improper specimen collection/handling, submission of specimen other than nasopharyngeal swab, presence of viral mutation(s) within the areas targeted by this assay, and inadequate number of viral copies(<138 copies/mL). Marques Ericson negative result must be combined with clinical observations, patient history, and epidemiological information. The expected result is Negative.  Fact Sheet for Patients:  EntrepreneurPulse.com.au  Fact Sheet for Healthcare Providers:  IncredibleEmployment.be  This test is no t yet approved or cleared by the [place] FDA and  has been authorized for detection and/or diagnosis of SARS-CoV-2 by FDA under an Emergency Use Authorization (EUA). This EUA will remain  in effect (meaning this test can be used) for the duration of the COVID-19  declaration under Section 564(b)(1) of the Act, 21 U.S.C.section 360bbb-3(b)(1), unless the authorization is terminated  or revoked sooner.       Influenza Alexandra Beck by PCR NEGATIVE NEGATIVE Final   Influenza B by PCR NEGATIVE NEGATIVE Final    Comment: (NOTE) The Xpert Xpress  SARS-CoV-2/FLU/RSV plus assay is intended as an aid in the diagnosis of influenza from Nasopharyngeal swab specimens and should not be used as Shiquita Collignon sole basis for treatment. Nasal washings and aspirates are unacceptable for Xpert Xpress SARS-CoV-2/FLU/RSV testing.  Fact Sheet for Patients: EntrepreneurPulse.com.au  Fact Sheet for Healthcare Providers: IncredibleEmployment.be  This test is not yet approved or cleared by the [place] FDA and has been authorized for detection and/or diagnosis of SARS-CoV-2 by FDA under an Emergency Use Authorization (EUA). This EUA will remain in effect (meaning this test can be used) for the duration of the COVID-19 declaration under Section 564(b)(1) of the Act, 21 U.S.C. section 360bbb-3(b)(1), unless the authorization is terminated or revoked.  Performed at [facility], [place] [address] [place], [place] [address]   Respiratory (~20 pathogens) panel by PCR     Status: None   Collection Time: 05/15/22  1:20 AM   Specimen: Nasopharyngeal Swab; Respiratory  Result Value Ref Range Status   Adenovirus NOT DETECTED NOT DETECTED Final   Coronavirus 229E NOT DETECTED NOT DETECTED Final    Comment: (NOTE) The Coronavirus on the Respiratory Panel, DOES NOT test for the novel  Coronavirus (2019 nCoV)    Coronavirus HKU1 NOT DETECTED NOT DETECTED Final   Coronavirus NL63 NOT DETECTED NOT DETECTED Final   Coronavirus OC43 NOT DETECTED NOT DETECTED Final   Metapneumovirus NOT DETECTED NOT DETECTED Final   Rhinovirus / Enterovirus NOT DETECTED NOT DETECTED Final   Influenza Alexandra Beck NOT DETECTED NOT DETECTED Final   Influenza B NOT DETECTED NOT  DETECTED Final   Parainfluenza Virus 1 NOT DETECTED NOT DETECTED Final   Parainfluenza Virus 2 NOT DETECTED NOT DETECTED Final   Parainfluenza Virus 3 NOT DETECTED NOT DETECTED Final   Parainfluenza Virus 4 NOT DETECTED NOT DETECTED Final   Respiratory Syncytial Virus NOT DETECTED NOT DETECTED Final   Bordetella pertussis NOT DETECTED NOT DETECTED Final   Bordetella Parapertussis NOT DETECTED NOT DETECTED Final   Chlamydophila pneumoniae NOT DETECTED NOT DETECTED Final   Mycoplasma pneumoniae NOT DETECTED NOT DETECTED Final    Comment: Performed at [facility], [place]. [address] [place], [place] [address]         Radiology Studies: DG Chest 2 View  Result Date: 05/15/2022 CLINICAL DATA:  Shortness of breath.  Cough. EXAM: CHEST - 2 VIEW COMPARISON:  Two-view chest x-ray 05/13/2022 FINDINGS: Heart size exaggerated by low lung volumes. Mild pulmonary vascular congestion is stable. No edema or effusion. No focal airspace disease is present. IMPRESSION: Low lung volumes and mild pulmonary vascular congestion. Electronically Signed   By: San Morelle M.D.   On: 05/15/2022 12:11   ECHOCARDIOGRAM COMPLETE  Result Date: 05/14/2022    ECHOCARDIOGRAM REPORT   Patient Name:   Alexandra Beck Date of Exam: 05/14/2022 Medical Rec #:  [id number]     Height:       62.0 in Accession #:    [id number]    Weight:       451.1 lb Date of Birth:  07-02-1981      BSA:          2.701 m Patient Age:    59 years      BP:           128/68 mmHg Patient Gender: F             HR:           85 bpm. Exam Location:  Inpatient Procedure:  2D Echo and Intracardiac Opacification Agent Indications:    abnormal ecg  History:        Patient has prior history of Echocardiogram examinations, most                 recent 09/18/2021. CHF; Risk Factors:Hypertension, Diabetes and                 Sleep Apnea.  Sonographer:    Johny Chess RDCS Referring Phys: [id number] Alexandra Beck  Sonographer Comments: Patient is morbidly  obese. Image acquisition challenging due to patient body habitus. IMPRESSIONS  1. Technically diffcult study d/t body habitus. Definity contrast was administered.  2. Left ventricular ejection fraction, by estimation, is 55 to 60%. The left ventricle has normal function. The left ventricle has no regional wall motion abnormalities. Left ventricular diastolic function could not be evaluated.  3. Right ventricular systolic function is normal. The right ventricular size is normal.  4. Left atrial size was mildly dilated.  5. The mitral valve is grossly normal. No evidence of mitral valve regurgitation.  6. The aortic valve was not well visualized. Aortic valve regurgitation is not visualized.  7. The inferior vena cava is normal in size with greater than 50% respiratory variability, suggesting right atrial pressure of 3 mmHg. Comparison(s): Changes from prior study are noted. 09/18/2021: LVEF 60-65%. FINDINGS  Left Ventricle: Left ventricular ejection fraction, by estimation, is 55 to 60%. The left ventricle has normal function. The left ventricle has no regional wall motion abnormalities. Definity contrast agent was given IV to delineate the left ventricular  endocardial borders. The left ventricular internal cavity size was normal in size. There is no left ventricular hypertrophy. Left ventricular diastolic function could not be evaluated. Right Ventricle: The right ventricular size is normal. No increase in right ventricular wall thickness. Right ventricular systolic function is normal. Left Atrium: Left atrial size was mildly dilated. Right Atrium: Right atrial size was normal in size. Pericardium: There is no evidence of pericardial effusion. Mitral Valve: The mitral valve is grossly normal. No evidence of mitral valve regurgitation. Tricuspid Valve: The tricuspid valve is not well visualized. Tricuspid valve regurgitation is not demonstrated. Aortic Valve: The aortic valve was not well visualized. Aortic valve  regurgitation is not visualized. Pulmonic Valve: The pulmonic valve was not well visualized. Pulmonic valve regurgitation is not visualized. Aorta: The ascending aorta was not well visualized. Venous: The inferior vena cava is normal in size with greater than 50% respiratory variability, suggesting right atrial pressure of 3 mmHg. IAS/Shunts: There is right bowing of the interatrial septum, suggestive of elevated left atrial pressure. The interatrial septum was not well visualized.  LEFT VENTRICLE PLAX 2D LVIDd:         5.70 cm LVIDs:         4.10 cm LV PW:         1.10 cm LV IVS:        1.00 cm  RIGHT VENTRICLE             IVC RV S prime:     18.10 cm/s  IVC diam: 1.70 cm TAPSE (M-mode): 2.8 cm LEFT ATRIUM             Index        RIGHT ATRIUM           Index LA diam:        3.90 cm 1.44 cm/m   RA Area:     15.10 cm LA Vol (  A2C):   89.6 ml 33.17 ml/m  RA Volume:   39.70 ml  14.70 ml/m LA Vol (A4C):   86.9 ml 32.17 ml/m LA Biplane Vol: 95.9 ml 35.50 ml/m  AORTIC VALVE LVOT Vmax:   121.00 cm/s LVOT Vmean:  80.900 cm/s LVOT VTI:    0.224 m  SHUNTS Systemic VTI: 0.22 m Lyman Bishop MD Electronically signed by Lyman Bishop MD Signature Date/Time: 05/14/2022/4:52:34 PM    Final         Scheduled Meds:  azithromycin  500 mg Oral Daily   brexpiprazole  0.5 mg Oral Daily   enoxaparin (LOVENOX) injection  80 mg Subcutaneous Q24H   insulin aspart  0-15 Units Subcutaneous TID WC   levothyroxine  150 mcg Oral QAC breakfast   metoprolol succinate  25 mg Oral Daily   modafinil  300 mg Oral Daily   rosuvastatin  10 mg Oral Daily   sertraline  200 mg Oral Daily   spironolactone  25 mg Oral Daily   torsemide  40 mg Oral BID   Continuous Infusions:  cefTRIAXone (ROCEPHIN)  IV 2 g (05/14/22 1737)     LOS: 1 day    Time spent: over 30 min    Fayrene Helper, MD [facility]   To contact the attending provider between 7A-7P or the covering provider during after hours 7P-7A, please log into  the web site [email or web address] and access using universal [place] password for that web site. If you do not have the password, please call the hospital operator.  05/15/2022, 2:14 PM

## 2022-05-15 NOTE — Progress Notes (Signed)
  Patient Saturations on Room Air at Rest = 90%  Patient Saturations on [employer] while Ambulating = 82%  Patient Saturations on 2 Liters of oxygen while Ambulating = 90%

## 2022-05-16 DIAGNOSIS — J189 Pneumonia, unspecified organism: Secondary | ICD-10-CM | POA: Diagnosis not present

## 2022-05-16 LAB — CBC WITH DIFFERENTIAL/PLATELET
Abs Immature Granulocytes: 0.04 10*3/uL (ref 0.00–0.07)
Basophils Absolute: 0 10*3/uL (ref 0.0–0.1)
Basophils Relative: 0 %
Eosinophils Absolute: 0.2 10*3/uL (ref 0.0–0.5)
Eosinophils Relative: 3 %
HCT: 31.9 % — ABNORMAL LOW (ref 36.0–46.0)
Hemoglobin: 9.6 g/dL — ABNORMAL LOW (ref 12.0–15.0)
Immature Granulocytes: 1 %
Lymphocytes Relative: 24 %
Lymphs Abs: 1.2 10*3/uL (ref 0.7–4.0)
MCH: 26.5 pg (ref 26.0–34.0)
MCHC: 30.1 g/dL (ref 30.0–36.0)
MCV: 88.1 fL (ref 80.0–100.0)
Monocytes Absolute: 0.6 10*3/uL (ref 0.1–1.0)
Monocytes Relative: 12 %
Neutro Abs: 3 10*3/uL (ref 1.7–7.7)
Neutrophils Relative %: 60 %
Platelets: 170 10*3/uL (ref 150–400)
RBC: 3.62 MIL/uL — ABNORMAL LOW (ref 3.87–5.11)
RDW: 16.7 % — ABNORMAL HIGH (ref 11.5–15.5)
WBC: 5 10*3/uL (ref 4.0–10.5)
nRBC: 0 % (ref 0.0–0.2)

## 2022-05-16 LAB — GLUCOSE, CAPILLARY
Glucose-Capillary: 103 mg/dL — ABNORMAL HIGH (ref 70–99)
Glucose-Capillary: 164 mg/dL — ABNORMAL HIGH (ref 70–99)
Glucose-Capillary: 126 mg/dL — ABNORMAL HIGH (ref 70–99)
Glucose-Capillary: 135 mg/dL — ABNORMAL HIGH (ref 70–99)

## 2022-05-16 LAB — PHOSPHORUS: Phosphorus: 3.1 mg/dL (ref 2.5–4.6)

## 2022-05-16 LAB — SEDIMENTATION RATE: Sed Rate: 90 mm/hr — ABNORMAL HIGH (ref 0–22)

## 2022-05-16 LAB — COMPREHENSIVE METABOLIC PANEL
ALT: 32 U/L (ref 0–44)
AST: 31 U/L (ref 15–41)
Albumin: 2.9 g/dL — ABNORMAL LOW (ref 3.5–5.0)
Alkaline Phosphatase: 87 U/L (ref 38–126)
Anion gap: 12 (ref 5–15)
BUN: 16 mg/dL (ref 6–20)
CO2: 25 mmol/L (ref 22–32)
Calcium: 8.4 mg/dL — ABNORMAL LOW (ref 8.9–10.3)
Chloride: 100 mmol/L (ref 98–111)
Creatinine, Ser: 1.2 mg/dL — ABNORMAL HIGH (ref 0.44–1.00)
GFR, Estimated: 58 mL/min — ABNORMAL LOW (ref >60)
Glucose, Bld: 116 mg/dL — ABNORMAL HIGH (ref 70–99)
Potassium: 3.5 mmol/L (ref 3.5–5.1)
Sodium: 137 mmol/L (ref 135–145)
Total Bilirubin: 0.4 mg/dL (ref 0.3–1.2)
Total Protein: 6.7 g/dL (ref 6.5–8.1)

## 2022-05-16 LAB — BRAIN NATRIURETIC PEPTIDE: B Natriuretic Peptide: 46.6 pg/mL (ref 0.0–100.0)

## 2022-05-16 LAB — PROCALCITONIN: Procalcitonin: 3.85 ng/mL

## 2022-05-16 LAB — C-REACTIVE PROTEIN: CRP: 14.9 mg/dL — ABNORMAL HIGH (ref <1.0)

## 2022-05-16 LAB — MAGNESIUM: Magnesium: 1.9 mg/dL (ref 1.7–2.4)

## 2022-05-16 MED ORDER — FUROSEMIDE 10 MG/ML IJ SOLN
80.0000 mg | INTRAMUSCULAR | Status: AC
  Administered 2022-05-16: 80 mg via INTRAVENOUS
  Filled 2022-05-16: qty 8

## 2022-05-16 MED ORDER — FUROSEMIDE 10 MG/ML IJ SOLN
80.0000 mg | Freq: Once | INTRAMUSCULAR | Status: DC
  Filled 2022-05-16: qty 8

## 2022-05-16 MED ORDER — FUROSEMIDE 10 MG/ML IJ SOLN: 80.0000 mg | Freq: Once | INTRAMUSCULAR | Status: DC

## 2022-05-16 MED ORDER — PREDNISONE 20 MG PO TABS: 40.0000 mg | ORAL_TABLET | Freq: Every day | ORAL | Status: DC

## 2022-05-16 MED ORDER — SODIUM CHLORIDE 0.9 % IV SOLN
2.0000 g | INTRAVENOUS | Status: DC
  Filled 2022-05-16: qty 20

## 2022-05-16 MED ORDER — FUROSEMIDE 10 MG/ML IJ SOLN
80.0000 mg | Freq: Once | INTRAMUSCULAR | Status: AC
  Administered 2022-05-16: 80 mg via INTRAVENOUS
  Filled 2022-05-16: qty 8

## 2022-05-16 NOTE — Progress Notes (Signed)
PROGRESS NOTE    Alexandra Beck  VXB:[id number] DOB: July 17, 1981 DOA: 05/13/2022 PCP: Biagio Borg, MD  Chief Complaint  Patient presents with   Fever    Brief Narrative:   Alexandra Beck is Alexandra Beck 41 y.o. female with history h/o morbid obesity, hypertension, hyperlipidemia, hypothyroidism, chronic diastolic CHF, chronic venous insufficiency of lower extremities, anemia, OSA/obesity hypoventilation syndrome, depression presents to the ED with complaints of dyspnea on exertion, fevers and dizziness for the last 2 days.  Patient is accompanied by her wife today in the ED.  Apparently wife was sick Alexandra Beck month back with URI and states multiple colleagues at her workplace were also sick at that time although unclear etiology (denies anyone formally being diagnosed by any particular viral illness).  Shortly after that patient apparently had some sore throat and dry cough.  Cough subsequently progressed to productive nature with yellow phlegm over the last week.  She however reports noticing fever only yesterday and has had dyspnea on exertion.  Patient does have Alexandra Beck history of diastolic CHF and was admitted previously in the hospital for that.  She states she did not have any chest discomfort at home but does feel somewhat "congested and pressure-like" after receiving IV fluid bolus in the ED (pointing towards lower chest and epigastric area).  She states she is usually hypertensive and takes blood pressure medicines at home, is worried about low normal BP here.  She did not take her antihypertensives this morning.   ED course: Febrile, Tmax 102.73F, pulse 74-93, respiratory rate 15, BP 108/62, O2 sat 97% on room air WBC 10.4, hemoglobin 11.4, hematocrit 37.8, platelet 194, potassium 3.3, bicarb 25, BUN 24, creatinine 1.41, calcium 9.1, at bedtime troponin elevated but stable at 23-24, BG 141, LA 3.2.  Patient received 2 L IV fluid bolus so far and her SBP is in 330Q while diastolic BP is 76-22.  Chest x-ray revealed  bibasilar atelectasis versus artifact and less likely to be pneumonia.  EDP requested hospitalist admission for pneumonia treatment given clinical presentation and persistent fevers.  Respiratory panel/COVID testing pending at this time.    Assessment & Plan:   Principal Problem:   Pneumonia Active Problems:   Acute dyspnea   Acute respiratory failure with hypoxia (HCC)   Fever   AKI (acute kidney injury) ([place])   Lightheadedness   OSA (obstructive sleep apnea)   Obesity hypoventilation syndrome (HCC)   Chronic diastolic CHF (congestive heart failure) (HCC)   Hypokalemia   Essential hypertension   Hypothyroidism   Diabetes mellitus (HCC)   GERD (gastroesophageal reflux disease)   Depression   Vitamin D deficiency   Chronic pain   Obesity, morbid, BMI 50 or higher (HCC)   Prolonged QT interval   Hyperlipidemia   Diabetes mellitus type 2 in obese (HCC)   Shortness of breath   Assessment and Plan: Acute dyspnea Treating for presumed pneumonia in setting of recent URI symptoms desats as low as 82% today with activity after discussion with RN CXR with low lung volumes and mild pulm vascular congestion CT PE with mild pulmonary edema and mild cardiomegaly Continue CAP coverage Dose of IV lasix today and follow response Some upper airway sounds, but no wheezing, no clear indication for steroids, follow Additional imaging if worsening if not improving    Fever Recurrent  Negative covid, influenza RVP negative Blood cultures NGx3 Repeat cultures Procal, inflammatory markers UA with many bacteria, 6-10 wbc, doesn't appear c/w UTI with negative nitrite/LE Continue for  CAP  Lightheadedness Improved today Due to resp illness?  AKI (acute kidney injury) ([place]) Unclear recent baseline Follow on home diuretics Losartan currently on hold  Chronic diastolic CHF (congestive heart failure) ([place]) Continue home torsemide (IV lasix today), spirionolactone, metoprolol Losartan  on hold  Obesity hypoventilation syndrome (HCC) Nightly CPAP  OSA (obstructive sleep apnea) CPAP nightly Uses 3 L at night at home in CPAP  Hypokalemia Replace and follow  Hypothyroidism synthroid  Essential hypertension Spironolactone, metoprolol, torsemide Losartan on hold  Diabetes mellitus (HCC) SSI a1c 5.8 Hold metformin while inpatient  Prolonged QT interval improved  Obesity, morbid, BMI 50 or higher ([place]) noted      DVT prophylaxis: lovenox Code Status: full Family Communication: none, wife over phone on speaker Disposition:   Status is: Observation The patient will require care spanning > 2 midnights and should be moved to inpatient because: continued hypoxia, need for additional IV abx   Consultants:  none  Procedures:  none  Antimicrobials:  Anti-infectives (From admission, onward)    Start     Dose/Rate Route Frequency Ordered Stop   05/14/22 1830  cefTRIAXone (ROCEPHIN) 2 g in sodium chloride 0.9 % 100 mL IVPB        2 g 200 mL/hr over 30 Minutes Intravenous Every 24 hours 05/14/22 1400     05/14/22 1730  azithromycin (ZITHROMAX) tablet 500 mg        500 mg Oral Daily 05/14/22 1530 05/19/22 0959   05/13/22 1830  cefTRIAXone (ROCEPHIN) 1 g in sodium chloride 0.9 % 100 mL IVPB  Status:  Discontinued        1 g 200 mL/hr over 30 Minutes Intravenous Every 24 hours 05/13/22 1754 05/14/22 1400       Subjective: C/o fever last night  Objective: Vitals:   05/16/22 0615 05/16/22 0823 05/16/22 0827 05/16/22 1223  BP: 122/60 127/74  121/63  Pulse: 67 70 70 74  Resp:  16    Temp: 98.5 F (36.9 C) 98.6 F (37 C)  98.8 F (37.1 C)  TempSrc: Oral Oral  Oral  SpO2: 95% 90% 91% 94%  Weight: (!) 200.2 kg     Height:        Intake/Output Summary (Last 24 hours) at 05/16/2022 1556 Last data filed at 05/16/2022 1300 Gross per 24 hour  Intake 600 ml  Output 4700 ml  Net -4100 ml   Filed Weights   05/14/22 0554 05/15/22 0535 05/16/22 0615   Weight: (!) 204.6 kg (!) 203 kg (!) 200.2 kg    Examination:  General: No acute distress. Cardiovascular: RRR Lungs: transmitted upper airway sounds, distant otherwise, no appreciated wheezing Abdomen: Soft, nontender, nondistended  Neurological: Alert and oriented 3. Moves all extremities 4 with equal strength. Cranial nerves II through XII grossly intact. Extremities: No clubbing or cyanosis. No edema.  Data Reviewed: I have personally reviewed following labs and imaging studies  CBC: Recent Labs  Lab 05/13/22 1221 05/13/22 1846 05/15/22 0341 05/16/22 0358  WBC 10.4 10.9* 5.2 5.0  NEUTROABS 9.5*  --  3.7 3.0  HGB 11.4* 10.0* 9.9* 9.6*  HCT 37.8 32.5* 31.2* 31.9*  MCV 87.9 87.6 85.5 88.1  PLT 194 173 162 527    Basic Metabolic Panel: Recent Labs  Lab 05/13/22 1221 05/13/22 1846 05/14/22 1120 05/15/22 0341 05/16/22 0358  NA 139  --  137 137 137  K 3.3*  --  3.5 3.4* 3.5  CL 102  --  101 101 100  CO2  25  --  '25 24 25  '$ GLUCOSE 141*  --  149* 141* 116*  BUN 24*  --  20 23* 16  CREATININE 1.41* 1.37* 1.32* 1.33* 1.20*  CALCIUM 9.1  --  8.5* 8.2* 8.4*  MG  --  1.8 1.9 2.0 1.9  PHOS  --   --   --  2.9 3.1    GFR: Estimated Creatinine Clearance: 107.2 mL/min (Britanny Marksberry) (by C-G formula based on SCr of 1.2 mg/dL (H)).  Liver Function Tests: Recent Labs  Lab 05/15/22 0341 05/16/22 0358  AST 41 31  ALT 29 32  ALKPHOS 83 87  BILITOT 0.5 0.4  PROT 6.6 6.7  ALBUMIN 2.9* 2.9*    CBG: Recent Labs  Lab 05/15/22 1121 05/15/22 1545 05/15/22 2005 05/16/22 0603 05/16/22 1150  GLUCAP 155* 115* 140* 103* 164*     Recent Results (from the past 240 hour(s))  Culture, blood (routine x 2)     Status: None (Preliminary result)   Collection Time: 05/13/22 12:22 PM   Specimen: BLOOD  Result Value Ref Range Status   Specimen Description BLOOD LEFT ANTECUBITAL  Final   Special Requests   Final    BOTTLES DRAWN AEROBIC AND ANAEROBIC Blood Culture adequate volume    Culture   Final    NO GROWTH 3 DAYS Performed at [facility], [place] [address], [place], [place] [address]    Report Status PENDING  Incomplete  Culture, blood (routine x 2)     Status: None (Preliminary result)   Collection Time: 05/13/22  1:48 PM   Specimen: BLOOD  Result Value Ref Range Status   Specimen Description BLOOD SITE NOT SPECIFIED  Final   Special Requests   Final    BOTTLES DRAWN AEROBIC AND ANAEROBIC Blood Culture adequate volume   Culture   Final    NO GROWTH 3 DAYS Performed at [facility], [address] [place], [place] [address]    Report Status PENDING  Incomplete  Resp Panel by RT-PCR (Flu Laquiesha Piacente&B, Covid) Anterior Nasal Swab     Status: None   Collection Time: 05/13/22  6:08 PM   Specimen: Anterior Nasal Swab  Result Value Ref Range Status   SARS Coronavirus 2 by RT PCR NEGATIVE NEGATIVE Final    Comment: (NOTE) SARS-CoV-2 target nucleic acids are NOT DETECTED.  The SARS-CoV-2 RNA is generally detectable in upper respiratory specimens during the acute phase of infection. The lowest concentration of SARS-CoV-2 viral copies this assay can detect is 138 copies/mL. Arrow Tomko negative result does not preclude SARS-Cov-2 infection and should not be used as the sole basis for treatment or other patient management decisions. Jahmier Willadsen negative result may occur with  improper specimen collection/handling, submission of specimen other than nasopharyngeal swab, presence of viral mutation(s) within the areas targeted by this assay, and inadequate number of viral copies(<138 copies/mL). Narelle Schoening negative result must be combined with clinical observations, patient history, and epidemiological information. The expected result is Negative.  Fact Sheet for Patients:  EntrepreneurPulse.com.au  Fact Sheet for Healthcare Providers:  IncredibleEmployment.be  This test is no t yet approved or cleared by the [place] FDA and  has been authorized  for detection and/or diagnosis of SARS-CoV-2 by FDA under an Emergency Use Authorization (EUA). This EUA will remain  in effect (meaning this test can be used) for the duration of the COVID-19 declaration under Section 564(b)(1) of the Act, 21 U.S.C.section 360bbb-3(b)(1), unless the authorization is terminated  or revoked  sooner.       Influenza Rayleigh Gillyard by PCR NEGATIVE NEGATIVE Final   Influenza B by PCR NEGATIVE NEGATIVE Final    Comment: (NOTE) The Xpert Xpress SARS-CoV-2/FLU/RSV plus assay is intended as an aid in the diagnosis of influenza from Nasopharyngeal swab specimens and should not be used as Arpi Diebold sole basis for treatment. Nasal washings and aspirates are unacceptable for Xpert Xpress SARS-CoV-2/FLU/RSV testing.  Fact Sheet for Patients: EntrepreneurPulse.com.au  Fact Sheet for Healthcare Providers: IncredibleEmployment.be  This test is not yet approved or cleared by the [place] FDA and has been authorized for detection and/or diagnosis of SARS-CoV-2 by FDA under an Emergency Use Authorization (EUA). This EUA will remain in effect (meaning this test can be used) for the duration of the COVID-19 declaration under Section 564(b)(1) of the Act, 21 U.S.C. section 360bbb-3(b)(1), unless the authorization is terminated or revoked.  Performed at [facility], [place] [address] [place], [place] [address]   Respiratory (~20 pathogens) panel by PCR     Status: None   Collection Time: 05/15/22  1:20 AM   Specimen: Nasopharyngeal Swab; Respiratory  Result Value Ref Range Status   Adenovirus NOT DETECTED NOT DETECTED Final   Coronavirus 229E NOT DETECTED NOT DETECTED Final    Comment: (NOTE) The Coronavirus on the Respiratory Panel, DOES NOT test for the novel  Coronavirus (2019 nCoV)    Coronavirus HKU1 NOT DETECTED NOT DETECTED Final   Coronavirus NL63 NOT DETECTED NOT DETECTED Final   Coronavirus OC43 NOT DETECTED NOT DETECTED Final    Metapneumovirus NOT DETECTED NOT DETECTED Final   Rhinovirus / Enterovirus NOT DETECTED NOT DETECTED Final   Influenza Sabria Florido NOT DETECTED NOT DETECTED Final   Influenza B NOT DETECTED NOT DETECTED Final   Parainfluenza Virus 1 NOT DETECTED NOT DETECTED Final   Parainfluenza Virus 2 NOT DETECTED NOT DETECTED Final   Parainfluenza Virus 3 NOT DETECTED NOT DETECTED Final   Parainfluenza Virus 4 NOT DETECTED NOT DETECTED Final   Respiratory Syncytial Virus NOT DETECTED NOT DETECTED Final   Bordetella pertussis NOT DETECTED NOT DETECTED Final   Bordetella Parapertussis NOT DETECTED NOT DETECTED Final   Chlamydophila pneumoniae NOT DETECTED NOT DETECTED Final   Mycoplasma pneumoniae NOT DETECTED NOT DETECTED Final    Comment: Performed at [facility], [place]. [address] [place], [place] [address]         Radiology Studies: CT Angio Chest Pulmonary Embolism (PE) W or WO Contrast  Result Date: 05/15/2022 CLINICAL DATA:  Chest pain or SOB, pleurisy or effusion suspected EXAM: CT ANGIOGRAPHY CHEST WITH CONTRAST TECHNIQUE: Multidetector CT imaging of the chest was performed using the standard protocol during bolus administration of intravenous contrast. Multiplanar CT image reconstructions and MIPs were obtained to evaluate the vascular anatomy. RADIATION DOSE REDUCTION: This exam was performed according to the departmental dose-optimization program which includes automated exposure control, adjustment of the mA and/or kV according to patient size and/or use of iterative reconstruction technique. CONTRAST:  141m OMNIPAQUE IOHEXOL 350 MG/ML SOLN COMPARISON:  09/18/2021 FINDINGS: Cardiovascular: Poor bolus technique and low-dose measurements result in Seena Face technically limited study which is adequate only for exclusion of large pulmonary emboli within the main and right and left pulmonary arteries. Lobar, segmental, and subsegmental pulmonary arteries are not adequately opacified to definitively exclude  the presence of small pulmonary emboli. Central pulmonary arteries are of normal caliber. Cardiac size is mildly enlarged. No significant coronary artery calcification. No pericardial effusion. Thoracic aorta is unremarkable.  Mediastinum/Nodes: No enlarged mediastinal, hilar, or axillary lymph nodes. Thyroid gland, trachea, and esophagus demonstrate no significant findings. Lungs/Pleura: There has developed mild bilateral perihilar ground-glass pulmonary infiltrate most suggestive of Alexandra Beck perihilar pulmonary edema. No confluent pulmonary infiltrate. No pneumothorax or pleural effusion. Central airways are widely patent. Upper Abdomen: No acute abnormality. Musculoskeletal: No acute bone abnormality. Review of the MIP images confirms the above findings. IMPRESSION: 1. Technically limited examination with poor bolus technique and low-dose measurements. No large central pulmonary emboli identified. 2. Interval development of mild perihilar pulmonary edema and mild cardiomegaly. This may reflect changes of congestive heart failure. Correlation with serum BNP may be helpful for further evaluation. Electronically Signed   By: Fidela Salisbury M.D.   On: 05/15/2022 18:21   DG Chest 2 View  Result Date: 05/15/2022 CLINICAL DATA:  Shortness of breath.  Cough. EXAM: CHEST - 2 VIEW COMPARISON:  Two-view chest x-ray 05/13/2022 FINDINGS: Heart size exaggerated by low lung volumes. Mild pulmonary vascular congestion is stable. No edema or effusion. No focal airspace disease is present. IMPRESSION: Low lung volumes and mild pulmonary vascular congestion. Electronically Signed   By: San Morelle M.D.   On: 05/15/2022 12:11   ECHOCARDIOGRAM COMPLETE  Result Date: 05/14/2022    ECHOCARDIOGRAM REPORT   Patient Name:   CYNETHIA SCHINDLER Date of Exam: 05/14/2022 Medical Rec #:  [id number]     Height:       62.0 in Accession #:    [id number]    Weight:       451.1 lb Date of Birth:  06/26/1981      BSA:          2.701 m Patient Age:     20 years      BP:           128/68 mmHg Patient Gender: F             HR:           85 bpm. Exam Location:  Inpatient Procedure: 2D Echo and Intracardiac Opacification Agent Indications:    abnormal ecg  History:        Patient has prior history of Echocardiogram examinations, most                 recent 09/18/2021. CHF; Risk Factors:Hypertension, Diabetes and                 Sleep Apnea.  Sonographer:    Johny Chess RDCS Referring Phys: [id number] [place] Shi  Sonographer Comments: Patient is morbidly obese. Image acquisition challenging due to patient body habitus. IMPRESSIONS  1. Technically diffcult study d/t body habitus. Definity contrast was administered.  2. Left ventricular ejection fraction, by estimation, is 55 to 60%. The left ventricle has normal function. The left ventricle has no regional wall motion abnormalities. Left ventricular diastolic function could not be evaluated.  3. Right ventricular systolic function is normal. The right ventricular size is normal.  4. Left atrial size was mildly dilated.  5. The mitral valve is grossly normal. No evidence of mitral valve regurgitation.  6. The aortic valve was not well visualized. Aortic valve regurgitation is not visualized.  7. The inferior vena cava is normal in size with greater than 50% respiratory variability, suggesting right atrial pressure of 3 mmHg. Comparison(s): Changes from prior study are noted. 09/18/2021: LVEF 60-65%. FINDINGS  Left Ventricle: Left ventricular ejection fraction, by estimation, is 55 to 60%. The left ventricle has normal function. The left  ventricle has no regional wall motion abnormalities. Definity contrast agent was given IV to delineate the left ventricular  endocardial borders. The left ventricular internal cavity size was normal in size. There is no left ventricular hypertrophy. Left ventricular diastolic function could not be evaluated. Right Ventricle: The right ventricular size is normal. No increase in  right ventricular wall thickness. Right ventricular systolic function is normal. Left Atrium: Left atrial size was mildly dilated. Right Atrium: Right atrial size was normal in size. Pericardium: There is no evidence of pericardial effusion. Mitral Valve: The mitral valve is grossly normal. No evidence of mitral valve regurgitation. Tricuspid Valve: The tricuspid valve is not well visualized. Tricuspid valve regurgitation is not demonstrated. Aortic Valve: The aortic valve was not well visualized. Aortic valve regurgitation is not visualized. Pulmonic Valve: The pulmonic valve was not well visualized. Pulmonic valve regurgitation is not visualized. Aorta: The ascending aorta was not well visualized. Venous: The inferior vena cava is normal in size with greater than 50% respiratory variability, suggesting right atrial pressure of 3 mmHg. IAS/Shunts: There is right bowing of the interatrial septum, suggestive of elevated left atrial pressure. The interatrial septum was not well visualized.  LEFT VENTRICLE PLAX 2D LVIDd:         5.70 cm LVIDs:         4.10 cm LV PW:         1.10 cm LV IVS:        1.00 cm  RIGHT VENTRICLE             IVC RV S prime:     18.10 cm/s  IVC diam: 1.70 cm TAPSE (M-mode): 2.8 cm LEFT ATRIUM             Index        RIGHT ATRIUM           Index LA diam:        3.90 cm 1.44 cm/m   RA Area:     15.10 cm LA Vol (A2C):   89.6 ml 33.17 ml/m  RA Volume:   39.70 ml  14.70 ml/m LA Vol (A4C):   86.9 ml 32.17 ml/m LA Biplane Vol: 95.9 ml 35.50 ml/m  AORTIC VALVE LVOT Vmax:   121.00 cm/s LVOT Vmean:  80.900 cm/s LVOT VTI:    0.224 m  SHUNTS Systemic VTI: 0.22 m Lyman Bishop MD Electronically signed by Lyman Bishop MD Signature Date/Time: 05/14/2022/4:52:34 PM    Final         Scheduled Meds:  azithromycin  500 mg Oral Daily   brexpiprazole  0.5 mg Oral Daily   enoxaparin (LOVENOX) injection  80 mg Subcutaneous Q24H   insulin aspart  0-15 Units Subcutaneous TID WC   levothyroxine  150 mcg  Oral QAC breakfast   metoprolol succinate  25 mg Oral Daily   modafinil  300 mg Oral Daily   rosuvastatin  10 mg Oral Daily   sertraline  200 mg Oral Daily   spironolactone  25 mg Oral Daily   Continuous Infusions:  cefTRIAXone (ROCEPHIN)  IV 2 g (05/15/22 1858)     LOS: 2 days    Time spent: over 30 min    Fayrene Helper, MD [facility]   To contact the attending provider between 7A-7P or the covering provider during after hours 7P-7A, please log into the web site [email or web address] and access using universal [place] password for that web site. If you do not have the password, please call  the hospital operator.  05/16/2022, 3:56 PM

## 2022-05-16 NOTE — Progress Notes (Signed)
RT called due to patients SATs in the 80's while on CPAP, however, RT unaware of patient being on CPAP due to there not being an order. Patient confirmed she did wear CPAP at home with 3L of oxygen bled in. 3L O2 bled into CPAP at this time. CPAP order placed as well. Patient places self on/off CPAP. Informed patient if she needs assistance to call. RT will continue to monitor.

## 2022-05-16 NOTE — TOC Progression Note (Addendum)
Transition of Care [facility]) - Progression Note    Patient Details  Name: Alexandra Beck MRN: [id number] Date of Birth: Apr 07, 1981  [facility]) CM/SW Contact  Zenon Mayo, RN Phone Number: 05/16/2022, 2:59 PM  Clinical Narrative:    NCM spoke with [employer] rep , they state patient has a concentrator from them and should have a cpap.  If she needs home oxygen they would need new oxygen orders and new saturations, she does not have any oxygen tanks. Patient states she uses the [employer].  TOC will continue to follow for dc needs.    Expected Discharge Plan: Home/Self Care Barriers to Discharge: Continued Medical Work up  Expected Discharge Plan and Services Expected Discharge Plan: Home/Self Care                                               Social Determinants of Health (SDOH) Interventions    Readmission Risk Interventions     No data to display

## 2022-05-16 NOTE — Progress Notes (Signed)
Mobility Specialist Progress Note    05/16/22 1503  Mobility  Activity Ambulated independently in hallway  Level of Assistance Independent  Assistive Device None  Distance Ambulated (ft) 240 ft  Activity Response Tolerated fair  $Mobility charge 1 Mobility   Pt received in bed and agreeable. No complaints. Did desat to ~85% on RA. Encouraged pursed lip breathing and returned to sitting EOB with call bell in reach.   Hildred Alamin Mobility Specialist

## 2022-05-16 NOTE — Progress Notes (Signed)
Patient has home unit CPAP at beside. 3L O2 bleed in needed. Patient is able to place on and off when ready.

## 2022-05-16 NOTE — Progress Notes (Signed)
Patient very lethargic this AM. O2 dropping to 80% while on RA sitting in bed. 2L applied. O2 90% on 2L. MD notified.  Era Bumpers, RN

## 2022-05-17 ENCOUNTER — Other Ambulatory Visit (HOSPITAL_COMMUNITY): Payer: Self-pay

## 2022-05-17 DIAGNOSIS — J189 Pneumonia, unspecified organism: Secondary | ICD-10-CM | POA: Diagnosis not present

## 2022-05-17 LAB — CBC WITH DIFFERENTIAL/PLATELET
Abs Immature Granulocytes: 0.06 10*3/uL (ref 0.00–0.07)
Basophils Absolute: 0 10*3/uL (ref 0.0–0.1)
Basophils Relative: 0 %
Eosinophils Absolute: 0.3 10*3/uL (ref 0.0–0.5)
Eosinophils Relative: 5 %
HCT: 34.1 % — ABNORMAL LOW (ref 36.0–46.0)
Hemoglobin: 10.5 g/dL — ABNORMAL LOW (ref 12.0–15.0)
Immature Granulocytes: 1 %
Lymphocytes Relative: 27 %
Lymphs Abs: 1.7 10*3/uL (ref 0.7–4.0)
MCH: 26.4 pg (ref 26.0–34.0)
MCHC: 30.8 g/dL (ref 30.0–36.0)
MCV: 85.7 fL (ref 80.0–100.0)
Monocytes Absolute: 0.7 10*3/uL (ref 0.1–1.0)
Monocytes Relative: 10 %
Neutro Abs: 3.5 10*3/uL (ref 1.7–7.7)
Neutrophils Relative %: 57 %
Platelets: 193 10*3/uL (ref 150–400)
RBC: 3.98 MIL/uL (ref 3.87–5.11)
RDW: 16.4 % — ABNORMAL HIGH (ref 11.5–15.5)
WBC: 6.3 10*3/uL (ref 4.0–10.5)
nRBC: 0 % (ref 0.0–0.2)

## 2022-05-17 LAB — COMPREHENSIVE METABOLIC PANEL
ALT: 34 U/L (ref 0–44)
AST: 31 U/L (ref 15–41)
Albumin: 3.1 g/dL — ABNORMAL LOW (ref 3.5–5.0)
Alkaline Phosphatase: 93 U/L (ref 38–126)
Anion gap: 11 (ref 5–15)
BUN: 18 mg/dL (ref 6–20)
CO2: 29 mmol/L (ref 22–32)
Calcium: 9 mg/dL (ref 8.9–10.3)
Chloride: 100 mmol/L (ref 98–111)
Creatinine, Ser: 1.16 mg/dL — ABNORMAL HIGH (ref 0.44–1.00)
GFR, Estimated: >60 mL/min (ref >60)
Glucose, Bld: 113 mg/dL — ABNORMAL HIGH (ref 70–99)
Potassium: 3.5 mmol/L (ref 3.5–5.1)
Sodium: 140 mmol/L (ref 135–145)
Total Bilirubin: 0.4 mg/dL (ref 0.3–1.2)
Total Protein: 7.2 g/dL (ref 6.5–8.1)

## 2022-05-17 LAB — MAGNESIUM: Magnesium: 2.2 mg/dL (ref 1.7–2.4)

## 2022-05-17 LAB — GLUCOSE, CAPILLARY: Glucose-Capillary: 137 mg/dL — ABNORMAL HIGH (ref 70–99)

## 2022-05-17 LAB — SEDIMENTATION RATE: Sed Rate: 92 mm/hr — ABNORMAL HIGH (ref 0–22)

## 2022-05-17 LAB — PROCALCITONIN: Procalcitonin: 2.8 ng/mL

## 2022-05-17 LAB — C-REACTIVE PROTEIN: CRP: 10.3 mg/dL — ABNORMAL HIGH (ref <1.0)

## 2022-05-17 LAB — PHOSPHORUS: Phosphorus: 4.5 mg/dL (ref 2.5–4.6)

## 2022-05-17 MED ORDER — POTASSIUM CHLORIDE CRYS ER 20 MEQ PO TBCR
40.0000 meq | EXTENDED_RELEASE_TABLET | Freq: Once | ORAL | Status: AC
  Administered 2022-05-17: 40 meq via ORAL
  Filled 2022-05-17: qty 2

## 2022-05-17 MED ORDER — AZITHROMYCIN 500 MG PO TABS
500.0000 mg | ORAL_TABLET | Freq: Once | ORAL | 0 refills | Status: AC
Start: 2022-05-18 — End: 2022-05-19
  Filled 2022-05-17: qty 1, 1d supply, fill #0

## 2022-05-17 MED ORDER — TORSEMIDE 20 MG PO TABS
80.0000 mg | ORAL_TABLET | Freq: Once | ORAL | Status: AC
  Administered 2022-05-17: 80 mg via ORAL
  Filled 2022-05-17: qty 4

## 2022-05-17 MED ORDER — AMOXICILLIN 500 MG PO CAPS
1000.0000 mg | ORAL_CAPSULE | Freq: Three times a day (TID) | ORAL | 0 refills | Status: AC
  Filled 2022-05-17: qty 30, 5d supply, fill #0

## 2022-05-17 NOTE — Progress Notes (Signed)
SATURATION QUALIFICATIONS: (This note is used to comply with regulatory documentation for home oxygen)  Patient Saturations on Room Air at Rest = 95%  Patient Saturations on Room Air while Ambulating = 85%  Patient Saturations on 2 Liters of oxygen while Ambulating = 93%

## 2022-05-17 NOTE — TOC Transition Note (Addendum)
Transition of Care [facility]) - CM/SW Discharge Note   Patient Details  Name: NEKESHA FONT MRN: [id number] Date of Birth: 1981-08-13  Transition of Care [facility]) CM/SW Contact:  Zenon Mayo, RN Phone Number: 05/17/2022, 3:46 PM   Clinical Narrative:    Patient is for dc today, she has concentrator with [employer], Festus contacted [employer] for oxygen tanks for patient to go home with.  Janett Billow with [employer] states she can not see anything on Epic and she will have to have her manager to call this NCM back when she gets off the phone to see why it is not coming up. Manager called this NCM back , states she found patient in epic and will have the oxygen delivered to patient [s room.      Barriers to Discharge: Continued Medical Work up   Patient Goals and CMS Choice Patient states their goals for this hospitalization and ongoing recovery are:: to go home      Discharge Placement                       Discharge Plan and Services                                     Social Determinants of Health (SDOH) Interventions     Readmission Risk Interventions     No data to display

## 2022-05-17 NOTE — Assessment & Plan Note (Addendum)
Treating for presumed pneumonia in setting of recent URI symptoms, fever, cough, SOB Maintaining sats at rest, requiring 2 L with activity CXR with low lung volumes and mild pulm vascular congestion CT PE with mild pulmonary edema and mild cardiomegaly Discharge home with cap coverage given fevers here.  Resume home diuretic regimen.  She's requiring 2 L with activity, follow long term o2 needs, may eventually need pulm follow up.

## 2022-05-17 NOTE — Progress Notes (Signed)
Mobility Specialist Progress Note:   05/17/22 1317  Mobility  Activity Ambulated with assistance in hallway  Level of Assistance Independent  Assistive Device None  Distance Ambulated (ft) 60 ft  Activity Response Tolerated well  $Mobility charge 1 Mobility   Pt received in bed willing to participate in mobility. No complaints of pain. Left in chair with call bell in reach and all needs met.  [facility] Keleigh Kazee Mobility Specialist

## 2022-05-17 NOTE — Discharge Summary (Signed)
Physician Discharge Summary  VERDELL KINCANNON Beck:[id number] DOB: 1981-01-06 DOA: 05/13/2022  PCP: Biagio Borg, MD  Admit date: 05/13/2022 Discharge date: 05/17/2022  Time spent: 40 minutes  Recommendations for Outpatient Follow-up:  Follow outpatient CBC/CMP  Completing treatment for pneumonia -> if persistent, recurrent fevers or malaise would need additional workup Ensure continued adherence to torsemide (CT PE here with evidence of edema) Requiring 2 L with activity, follow long term O2 needs outpatient, consider pulm follow up outpatient   Discharge Diagnoses:  Principal Problem:   Pneumonia Active Problems:   Acute dyspnea   Acute respiratory failure with hypoxia (HCC)   Fever   AKI (acute kidney injury) ([place])   Lightheadedness   OSA (obstructive sleep apnea)   Obesity hypoventilation syndrome (HCC)   Chronic diastolic CHF (congestive heart failure) (HCC)   Hypokalemia   Essential hypertension   Hypothyroidism   Diabetes mellitus (HCC)   GERD (gastroesophageal reflux disease)   Depression   Vitamin D deficiency   Chronic pain   Obesity, morbid, BMI 50 or higher ([place])   Prolonged QT interval   Hyperlipidemia   Diabetes mellitus type 2 in obese (HCC)   Shortness of breath   Discharge Condition: stable  Diet recommendation: heart healthy  Filed Weights   05/14/22 0554 05/15/22 0535 05/16/22 0615  Weight: (!) 204.6 kg (!) 203 kg (!) 200.2 kg    History of present illness:   Alexandra Beck is Alexandra Beck 41 y.o. female with history h/o morbid obesity, hypertension, hyperlipidemia, hypothyroidism, chronic diastolic CHF, chronic venous insufficiency of lower extremities, anemia, OSA/obesity hypoventilation syndrome, depression presents to the ED with complaints of dyspnea on exertion, fevers and dizziness for the last 2 days.  Patient is accompanied by her wife today in the ED.  Apparently wife was sick Elaisha Zahniser month back with URI and states multiple colleagues at her workplace were also  sick at that time although unclear etiology (denies anyone formally being diagnosed by any particular viral illness).  Shortly after that patient apparently had some sore throat and dry cough.  Cough subsequently progressed to productive nature with yellow phlegm over the last week.  She however reports noticing fever only yesterday and has had dyspnea on exertion.  Patient does have Shaquan Missey history of diastolic CHF and was admitted previously in the hospital for that.  She was admitted for fever and shortness of breath and has been treated for pneumonia.  CT PE protocol was concerning for edema, given IV lasix, then transitioned back to her home torsemide.  She's gradually improving on discharge, plan for discharge with outpatient follow up.  She's requiring O2 with activity at this time.  See below for additional details  Hospital Course:  Assessment and Plan: Acute respiratory failure with hypoxia ([place]) Treating for presumed pneumonia in setting of recent URI symptoms, fever, cough, SOB Maintaining sats at rest, requiring 2 L with activity CXR with low lung volumes and mild pulm vascular congestion CT PE with mild pulmonary edema and mild cardiomegaly Discharge home with cap coverage given fevers here.  Resume home diuretic regimen.  She's requiring 2 L with activity, follow long term o2 needs, may eventually need pulm follow up.   Acute dyspnea    Fever Recurrent  Negative covid, influenza RVP negative Blood cultures NGx3 Repeat blood cx NG <24 hrs Procal, inflammatory markers -> elevated, downtrending generally (procal, CRP downtrending - sed rate relatively stable) UA with many bacteria, 6-10 wbc, doesn't appear c/w UTI with negative nitrite/LE  Continue coverage for CAP.  CT chest, this show's edema.  Will continue coverage for CAP, but if she's not continuing to improve with abx, would consider additional w/u outpatient.  Lightheadedness resolved  AKI (acute kidney injury)  ([place]) Improved Follow outpatient  Chronic diastolic CHF (congestive heart failure) (HCC) CT chest concerning for edema Echo with EF 55-60%, IVC normal in size with 50% resp variability Resume home torsemide at discharge spirionolactone, metoprolol Losartan  Follow outpatient   Obesity hypoventilation syndrome (HCC) Nightly CPAP  OSA (obstructive sleep apnea) CPAP nightly Uses 3 L at night at home in CPAP  Hypokalemia Replace and follow  Hypothyroidism synthroid  Essential hypertension Spironolactone, metoprolol, torsemide Resume losartan  Diabetes mellitus (HCC) SSI a1c 5.8 Resume metformin while inpatient  Prolonged QT interval improved  Obesity, morbid, BMI 50 or higher ([place]) noted          Procedures: Echo IMPRESSIONS     1. Technically diffcult study d/t body habitus. Definity contrast was  administered.   2. Left ventricular ejection fraction, by estimation, is 55 to 60%. The  left ventricle has normal function. The left ventricle has no regional  wall motion abnormalities. Left ventricular diastolic function could not  be evaluated.   3. Right ventricular systolic function is normal. The right ventricular  size is normal.   4. Left atrial size was mildly dilated.   5. The mitral valve is grossly normal. No evidence of mitral valve  regurgitation.   6. The aortic valve was not well visualized. Aortic valve regurgitation  is not visualized.   7. The inferior vena cava is normal in size with greater than 50%  respiratory variability, suggesting right atrial pressure of 3 mmHg.   Comparison(s): Changes from prior study are noted. 09/18/2021: LVEF  60-65%.    Consultations: none  Discharge Exam: Vitals:   05/16/22 2010 05/17/22 0656  BP: 139/69 123/73  Pulse: 79 62  Resp: 16 16  Temp: 98.3 F (36.8 C) 98.3 F (36.8 C)  SpO2: 96% 92%   She's eager to discharge today if possible Generally feels better  General: No acute  distress. Erythematous posterior pharynx, no exudate.  No cervical LAD. Cardiovascular: RRR Lungs: Clear to auscultation bilaterally Abdomen: Soft, nontender, nondistended Neurological: Alert and oriented 3. Moves all extremities 4 with equal strength. Cranial nerves II through XII grossly intact. Extremities: No clubbing or cyanosis. No edema  Discharge Instructions   Discharge Instructions     (HEART FAILURE PATIENTS) Call MD:  Anytime you have any of the following symptoms: 1) 3 pound weight gain in 24 hours or 5 pounds in 1 week 2) shortness of breath, with or without Tashima Scarpulla dry hacking cough 3) swelling in the hands, feet or stomach 4) if you have to sleep on extra pillows at night in order to breathe.   Complete by: As directed    Call MD for:  difficulty breathing, headache or visual disturbances   Complete by: As directed    Call MD for:  extreme fatigue   Complete by: As directed    Call MD for:  hives   Complete by: As directed    Call MD for:  persistant dizziness or light-headedness   Complete by: As directed    Call MD for:  persistant nausea and vomiting   Complete by: As directed    Call MD for:  redness, tenderness, or signs of infection (pain, swelling, redness, odor or green/yellow discharge around incision site)   Complete by:  As directed    Call MD for:  severe uncontrolled pain   Complete by: As directed    Call MD for:  temperature >100.4   Complete by: As directed    Diet - low sodium heart healthy   Complete by: As directed    Discharge instructions   Complete by: As directed    You were seen for fever and shortness of breath.  You've been treated for presumptive pneumonia.  Your imaging has not been clearly suggestive of pneumonia, but your symptoms (cough, shortness of breath, and fever along with URI symptoms fit, so we'll complete Cali Cuartas course of antibiotics and see how you do).  Your CT scan showed evidence of edema.  Continue your torsemide as prescribed.   Watch your weights and if you gain more than 2-3 lbs in Arian Mcquitty day or more than 5 lbs in Darius Lundberg week, call your prescriber to see if they think your dose needs adjusting.  You need 2 L oxygen with activity.  Please follow up with your PCP outpatient with this.  They'll follow up and determine whether this should be something you'll need long term.  If this becomes something that you can't wean off, you should see pulmonary as an outpatient.   If you continue to feel poorly and have fevers, you'll need additional work up.  Return for new, recurrent, or worsening symptoms.    Please ask your PCP to request records from this hospitalization so they know what was done and what the next steps will be.   Increase activity slowly   Complete by: As directed    No wound care   Complete by: As directed       Allergies as of 05/17/2022       Reactions   Aspirin Hives, Swelling   REACTION: throat swelling, hives Other reaction(s): Unknown Other reaction(s): Unknown   Doxycycline Other (See Comments)   Abdominal pain, difficulty swallowing   Lisinopril Cough   Niacin And Related    Other reaction(s): Unknown Other reaction(s): Unknown   Niaspan [niacin Er]    Caused flushing        Medication List     STOP taking these medications    cefadroxil 500 MG capsule Commonly known as: DURICEF       TAKE these medications    acetaminophen 500 MG tablet Commonly known as: TYLENOL Take 1,000-2,000 mg by mouth every 6 (six) hours as needed for mild pain.   amoxicillin 500 MG capsule Commonly known as: AMOXIL Take 2 capsules (1,000 mg total) by mouth 3 (three) times daily for 5 days.   azithromycin 500 MG tablet Commonly known as: ZITHROMAX Take 1 tablet (500 mg total) by mouth once for 1 dose. Start taking on: May 18, 2022   cyclobenzaprine 10 MG tablet Commonly known as: FLEXERIL Take 1 tablet (10 mg total) by mouth 3 (three) times daily. What changed:  when to take this reasons to  take this   ibuprofen 200 MG tablet Commonly known as: ADVIL Take 800 mg by mouth every 6 (six) hours as needed for headache or moderate pain.   levothyroxine 150 MCG tablet Commonly known as: SYNTHROID Take 1 tablet (150 mcg total) by mouth daily before breakfast.   losartan 100 MG tablet Commonly known as: COZAAR Take 1 tablet (100 mg total) by mouth daily.   metFORMIN 500 MG tablet Commonly known as: GLUCOPHAGE TAKE 1 TABLET(500 MG) BY MOUTH DAILY What changed:  how much to take  how to take this when to take this additional instructions   metoprolol succinate 50 MG 24 hr tablet Commonly known as: TOPROL-XL TAKE 1 TABLET BY MOUTH DAILY WITH OR IMMEDIATELY FOLLOWING Joyia Riehle MEAL What changed:  how much to take how to take this when to take this additional instructions   modafinil 100 MG tablet Commonly known as: PROVIGIL Take '100mg'$  tablet by mouth daily at 9am along with 200 mg tablet daily at 9am (total '300mg'$ ) What changed: Another medication with the same name was removed. Continue taking this medication, and follow the directions you see here.   Ozempic (1 MG/DOSE) 4 MG/3ML Sopn Generic drug: Semaglutide (1 MG/DOSE) Inject 1 mg as directed once Kadra Kohan week. What changed: additional instructions   Rexulti 0.5 MG Tabs Generic drug: Brexpiprazole Take 1 tablet by mouth daily.   rosuvastatin 10 MG tablet Commonly known as: CRESTOR TAKE 1 TABLET(10 MG) BY MOUTH DAILY What changed:  how much to take how to take this when to take this additional instructions   sertraline 100 MG tablet Commonly known as: ZOLOFT Take 1 tablet by mouth 2 times daily. What changed:  how much to take when to take this   spironolactone 25 MG tablet Commonly known as: ALDACTONE Take 1 tablet (25 mg total) by mouth daily.   torsemide 20 MG tablet Commonly known as: DEMADEX Take 3 tablets (60 mg total) by mouth in the morning AND 2 tablets (40 mg total) every evening as needed What  changed: See the new instructions.   traMADol 50 MG tablet Commonly known as: ULTRAM Take 1 tablet (50 mg total) by mouth every 8 (eight) hours as needed. What changed: reasons to take this               Durable Medical Equipment  (From admission, onward)           Start     Ordered   05/17/22 1441  For home use only DME oxygen  Once       Question Answer Comment  Length of Need 6 Months   Mode or (Route) Nasal cannula   Liters per Minute 2   Frequency Continuous (stationary and portable oxygen unit needed)   Oxygen conserving device Yes   Oxygen delivery system Gas      05/17/22 1441           Allergies  Allergen Reactions   Aspirin Hives and Swelling    REACTION: throat swelling, hives Other reaction(s): Unknown Other reaction(s): Unknown   Doxycycline Other (See Comments)    Abdominal pain, difficulty swallowing   Lisinopril Cough   Niacin And Related     Other reaction(s): Unknown Other reaction(s): Unknown   Niaspan [Niacin Er]     Caused flushing      The results of significant diagnostics from this hospitalization (including imaging, microbiology, ancillary and laboratory) are listed below for reference.    Significant Diagnostic Studies: CT Angio Chest Pulmonary Embolism (PE) W or WO Contrast  Result Date: 05/15/2022 CLINICAL DATA:  Chest pain or SOB, pleurisy or effusion suspected EXAM: CT ANGIOGRAPHY CHEST WITH CONTRAST TECHNIQUE: Multidetector CT imaging of the chest was performed using the standard protocol during bolus administration of intravenous contrast. Multiplanar CT image reconstructions and MIPs were obtained to evaluate the vascular anatomy. RADIATION DOSE REDUCTION: This exam was performed according to the departmental dose-optimization program which includes automated exposure control, adjustment of the mA and/or kV according to patient size and/or use of iterative reconstruction  technique. CONTRAST:  153m OMNIPAQUE IOHEXOL 350  MG/ML SOLN COMPARISON:  09/18/2021 FINDINGS: Cardiovascular: Poor bolus technique and low-dose measurements result in Ana Woodroof technically limited study which is adequate only for exclusion of large pulmonary emboli within the main and right and left pulmonary arteries. Lobar, segmental, and subsegmental pulmonary arteries are not adequately opacified to definitively exclude the presence of small pulmonary emboli. Central pulmonary arteries are of normal caliber. Cardiac size is mildly enlarged. No significant coronary artery calcification. No pericardial effusion. Thoracic aorta is unremarkable. Mediastinum/Nodes: No enlarged mediastinal, hilar, or axillary lymph nodes. Thyroid gland, trachea, and esophagus demonstrate no significant findings. Lungs/Pleura: There has developed mild bilateral perihilar ground-glass pulmonary infiltrate most suggestive of Shameek Nyquist perihilar pulmonary edema. No confluent pulmonary infiltrate. No pneumothorax or pleural effusion. Central airways are widely patent. Upper Abdomen: No acute abnormality. Musculoskeletal: No acute bone abnormality. Review of the MIP images confirms the above findings. IMPRESSION: 1. Technically limited examination with poor bolus technique and low-dose measurements. No large central pulmonary emboli identified. 2. Interval development of mild perihilar pulmonary edema and mild cardiomegaly. This may reflect changes of congestive heart failure. Correlation with serum BNP may be helpful for further evaluation. Electronically Signed   By: AFidela SalisburyM.D.   On: 05/15/2022 18:21   DG Chest 2 View  Result Date: 05/15/2022 CLINICAL DATA:  Shortness of breath.  Cough. EXAM: CHEST - 2 VIEW COMPARISON:  Two-view chest x-ray 05/13/2022 FINDINGS: Heart size exaggerated by low lung volumes. Mild pulmonary vascular congestion is stable. No edema or effusion. No focal airspace disease is present. IMPRESSION: Low lung volumes and mild pulmonary vascular congestion.  Electronically Signed   By: CSan MorelleM.D.   On: 05/15/2022 12:11   ECHOCARDIOGRAM COMPLETE  Result Date: 05/14/2022    ECHOCARDIOGRAM REPORT   Patient Name:   LMARGREE GIMBELDate of Exam: 05/14/2022 Medical Rec #:  [id number]    Height:       62.0 in Accession #:    [id number]   Weight:       451.1 lb Date of Birth:  7Dec 09, 1982     BSA:          2.701 m Patient Age:    89years      BP:           128/68 mmHg Patient Gender: F             HR:           85 bpm. Exam Location:  Inpatient Procedure: 2D Echo and Intracardiac Opacification Agent Indications:    abnormal ecg  History:        Patient has prior history of Echocardiogram examinations, most                 recent 09/18/2021. CHF; Risk Factors:Hypertension, Diabetes and                 Sleep Apnea.  Sonographer:    LJohny ChessRDCS Referring Phys: [id number] Shi Sonographer Comments: Patient is morbidly obese. Image acquisition challenging due to patient body habitus. IMPRESSIONS  1. Technically diffcult study d/t body habitus. Definity contrast was administered.  2. Left ventricular ejection fraction, by estimation, is 55 to 60%. The left ventricle has normal function. The left ventricle has no regional wall motion abnormalities. Left ventricular diastolic function could not be evaluated.  3. Right ventricular systolic function is normal. The right ventricular size is normal.  4. Left  atrial size was mildly dilated.  5. The mitral valve is grossly normal. No evidence of mitral valve regurgitation.  6. The aortic valve was not well visualized. Aortic valve regurgitation is not visualized.  7. The inferior vena cava is normal in size with greater than 50% respiratory variability, suggesting right atrial pressure of 3 mmHg. Comparison(s): Changes from prior study are noted. 09/18/2021: LVEF 60-65%. FINDINGS  Left Ventricle: Left ventricular ejection fraction, by estimation, is 55 to 60%. The left ventricle has normal function. The  left ventricle has no regional wall motion abnormalities. Definity contrast agent was given IV to delineate the left ventricular  endocardial borders. The left ventricular internal cavity size was normal in size. There is no left ventricular hypertrophy. Left ventricular diastolic function could not be evaluated. Right Ventricle: The right ventricular size is normal. No increase in right ventricular wall thickness. Right ventricular systolic function is normal. Left Atrium: Left atrial size was mildly dilated. Right Atrium: Right atrial size was normal in size. Pericardium: There is no evidence of pericardial effusion. Mitral Valve: The mitral valve is grossly normal. No evidence of mitral valve regurgitation. Tricuspid Valve: The tricuspid valve is not well visualized. Tricuspid valve regurgitation is not demonstrated. Aortic Valve: The aortic valve was not well visualized. Aortic valve regurgitation is not visualized. Pulmonic Valve: The pulmonic valve was not well visualized. Pulmonic valve regurgitation is not visualized. Aorta: The ascending aorta was not well visualized. Venous: The inferior vena cava is normal in size with greater than 50% respiratory variability, suggesting right atrial pressure of 3 mmHg. IAS/Shunts: There is right bowing of the interatrial septum, suggestive of elevated left atrial pressure. The interatrial septum was not well visualized.  LEFT VENTRICLE PLAX 2D LVIDd:         5.70 cm LVIDs:         4.10 cm LV PW:         1.10 cm LV IVS:        1.00 cm  RIGHT VENTRICLE             IVC RV S prime:     18.10 cm/s  IVC diam: 1.70 cm TAPSE (M-mode): 2.8 cm LEFT ATRIUM             Index        RIGHT ATRIUM           Index LA diam:        3.90 cm 1.44 cm/m   RA Area:     15.10 cm LA Vol (A2C):   89.6 ml 33.17 ml/m  RA Volume:   39.70 ml  14.70 ml/m LA Vol (A4C):   86.9 ml 32.17 ml/m LA Biplane Vol: 95.9 ml 35.50 ml/m  AORTIC VALVE LVOT Vmax:   121.00 cm/s LVOT Vmean:  80.900 cm/s LVOT VTI:     0.224 m  SHUNTS Systemic VTI: 0.22 m Lyman Bishop MD Electronically signed by Lyman Bishop MD Signature Date/Time: 05/14/2022/4:52:34 PM    Final    DG Chest 2 View  Result Date: 05/13/2022 CLINICAL DATA:  Shortness of breath, fever, dizziness EXAM: CHEST - 2 VIEW COMPARISON:  Previous studies including the examination of 09/09/2021 FINDINGS: Cardiac size is within normal limits. There are no signs of pulmonary edema. Subtle increased density in both lower lung fields may be an artifact due to chest wall attenuation. Less likely possibility would be early pneumonia. Cardiac and diaphragmatic margins are preserved. Costophrenic angles are clear. There is no pneumothorax. IMPRESSION:  Subtle increased density in the lower lung fields may be an artifact due to chest wall attenuation. Less likely possibility would be early pneumonia. There are no signs of alveolar pulmonary edema. Electronically Signed   By: Elmer Picker M.D.   On: 05/13/2022 13:09    Microbiology: Recent Results (from the past 240 hour(s))  Culture, blood (routine x 2)     Status: None (Preliminary result)   Collection Time: 05/13/22 12:22 PM   Specimen: BLOOD  Result Value Ref Range Status   Specimen Description BLOOD LEFT ANTECUBITAL  Final   Special Requests   Final    BOTTLES DRAWN AEROBIC AND ANAEROBIC Blood Culture adequate volume   Culture   Final    NO GROWTH 4 DAYS Performed at [facility], [address] [place], [place] [address]    Report Status PENDING  Incomplete  Culture, blood (routine x 2)     Status: None (Preliminary result)   Collection Time: 05/13/22  1:48 PM   Specimen: BLOOD  Result Value Ref Range Status   Specimen Description BLOOD SITE NOT SPECIFIED  Final   Special Requests   Final    BOTTLES DRAWN AEROBIC AND ANAEROBIC Blood Culture adequate volume   Culture   Final    NO GROWTH 4 DAYS Performed at [facility], [address], [place], [place] [address]    Report Status  PENDING  Incomplete  Resp Panel by RT-PCR (Flu Jaeleen Inzunza&B, Covid) Anterior Nasal Swab     Status: None   Collection Time: 05/13/22  6:08 PM   Specimen: Anterior Nasal Swab  Result Value Ref Range Status   SARS Coronavirus 2 by RT PCR NEGATIVE NEGATIVE Final    Comment: (NOTE) SARS-CoV-2 target nucleic acids are NOT DETECTED.  The SARS-CoV-2 RNA is generally detectable in upper respiratory specimens during the acute phase of infection. The lowest concentration of SARS-CoV-2 viral copies this assay can detect is 138 copies/mL. Mayrin Schmuck negative result does not preclude SARS-Cov-2 infection and should not be used as the sole basis for treatment or other patient management decisions. Carr Shartzer negative result may occur with  improper specimen collection/handling, submission of specimen other than nasopharyngeal swab, presence of viral mutation(s) within the areas targeted by this assay, and inadequate number of viral copies(<138 copies/mL). Mendy Lapinsky negative result must be combined with clinical observations, patient history, and epidemiological information. The expected result is Negative.  Fact Sheet for Patients:  EntrepreneurPulse.com.au  Fact Sheet for Healthcare Providers:  IncredibleEmployment.be  This test is no t yet approved or cleared by the [place] FDA and  has been authorized for detection and/or diagnosis of SARS-CoV-2 by FDA under an Emergency Use Authorization (EUA). This EUA will remain  in effect (meaning this test can be used) for the duration of the COVID-19 declaration under Section 564(b)(1) of the Act, 21 U.S.C.section 360bbb-3(b)(1), unless the authorization is terminated  or revoked sooner.       Influenza Kevonna Nolte by PCR NEGATIVE NEGATIVE Final   Influenza B by PCR NEGATIVE NEGATIVE Final    Comment: (NOTE) The Xpert Xpress SARS-CoV-2/FLU/RSV plus assay is intended as an aid in the diagnosis of influenza from Nasopharyngeal swab specimens  and should not be used as Garry Nicolini sole basis for treatment. Nasal washings and aspirates are unacceptable for Xpert Xpress SARS-CoV-2/FLU/RSV testing.  Fact Sheet for Patients: EntrepreneurPulse.com.au  Fact Sheet for Healthcare Providers: IncredibleEmployment.be  This test is not yet approved or cleared by the [place] and  has been authorized for detection and/or diagnosis of SARS-CoV-2 by FDA under an Emergency Use Authorization (EUA). This EUA will remain in effect (meaning this test can be used) for the duration of the COVID-19 declaration under Section 564(b)(1) of the Act, 21 U.S.C. section 360bbb-3(b)(1), unless the authorization is terminated or revoked.  Performed at [facility], [place] [address] [place], [place] [address]   Respiratory (~20 pathogens) panel by PCR     Status: None   Collection Time: 05/15/22  1:20 AM   Specimen: Nasopharyngeal Swab; Respiratory  Result Value Ref Range Status   Adenovirus NOT DETECTED NOT DETECTED Final   Coronavirus 229E NOT DETECTED NOT DETECTED Final    Comment: (NOTE) The Coronavirus on the Respiratory Panel, DOES NOT test for the novel  Coronavirus (2019 nCoV)    Coronavirus HKU1 NOT DETECTED NOT DETECTED Final   Coronavirus NL63 NOT DETECTED NOT DETECTED Final   Coronavirus OC43 NOT DETECTED NOT DETECTED Final   Metapneumovirus NOT DETECTED NOT DETECTED Final   Rhinovirus / Enterovirus NOT DETECTED NOT DETECTED Final   Influenza Calyb Mcquarrie NOT DETECTED NOT DETECTED Final   Influenza B NOT DETECTED NOT DETECTED Final   Parainfluenza Virus 1 NOT DETECTED NOT DETECTED Final   Parainfluenza Virus 2 NOT DETECTED NOT DETECTED Final   Parainfluenza Virus 3 NOT DETECTED NOT DETECTED Final   Parainfluenza Virus 4 NOT DETECTED NOT DETECTED Final   Respiratory Syncytial Virus NOT DETECTED NOT DETECTED Final   Bordetella pertussis NOT DETECTED NOT DETECTED Final   Bordetella Parapertussis NOT DETECTED  NOT DETECTED Final   Chlamydophila pneumoniae NOT DETECTED NOT DETECTED Final   Mycoplasma pneumoniae NOT DETECTED NOT DETECTED Final    Comment: Performed at [facility], [place]. [address], [place], [place] [address]  Culture, blood (Routine X 2) w Reflex to ID Panel     Status: None (Preliminary result)   Collection Time: 05/16/22  1:09 PM   Specimen: BLOOD  Result Value Ref Range Status   Specimen Description BLOOD LEFT ANTECUBITAL  Final   Special Requests   Final    BOTTLES DRAWN AEROBIC AND ANAEROBIC Blood Culture adequate volume   Culture   Final    NO GROWTH < 24 HOURS Performed at [facility], [place] [address], [place], [place] [address]    Report Status PENDING  Incomplete  Culture, blood (Routine X 2) w Reflex to ID Panel     Status: None (Preliminary result)   Collection Time: 05/16/22  1:09 PM   Specimen: BLOOD  Result Value Ref Range Status   Specimen Description BLOOD LEFT ANTECUBITAL  Final   Special Requests   Final    BOTTLES DRAWN AEROBIC AND ANAEROBIC Blood Culture adequate volume   Culture   Final    NO GROWTH < 24 HOURS Performed at [facility], [place] [address], [place], [place] [address]    Report Status PENDING  Incomplete     Labs: Basic Metabolic Panel: Recent Labs  Lab 05/13/22 1221 05/13/22 1846 05/14/22 1120 05/15/22 0341 05/16/22 0358 05/17/22 0351  NA 139  --  137 137 137 140  K 3.3*  --  3.5 3.4* 3.5 3.5  CL 102  --  101 101 100 100  CO2 25  --  '25 24 25 29  '$ GLUCOSE 141*  --  149* 141* 116* 113*  BUN 24*  --  20 23* 16 18  CREATININE 1.41* 1.37* 1.32* 1.33* 1.20* 1.16*  CALCIUM 9.1  --  8.5* 8.2* 8.4* 9.0  MG  --  1.8 1.9 2.0 1.9 2.2  PHOS  --   --   --  2.9 3.1 4.5   Liver Function Tests: Recent Labs  Lab 05/15/22 0341 05/16/22 0358 05/17/22 0351  AST 41 31 31  ALT 29 32 34  ALKPHOS 83 87 93  BILITOT 0.5 0.4 0.4  PROT 6.6 6.7 7.2  ALBUMIN 2.9* 2.9* 3.1*   No results for input(s): "LIPASE", "AMYLASE" in the  last 168 hours. No results for input(s): "AMMONIA" in the last 168 hours. CBC: Recent Labs  Lab 05/13/22 1221 05/13/22 1846 05/15/22 0341 05/16/22 0358 05/17/22 0351  WBC 10.4 10.9* 5.2 5.0 6.3  NEUTROABS 9.5*  --  3.7 3.0 3.5  HGB 11.4* 10.0* 9.9* 9.6* 10.5*  HCT 37.8 32.5* 31.2* 31.9* 34.1*  MCV 87.9 87.6 85.5 88.1 85.7  PLT 194 173 162 170 193   Cardiac Enzymes: No results for input(s): "CKTOTAL", "CKMB", "CKMBINDEX", "TROPONINI" in the last 168 hours. BNP: BNP (last 3 results) Recent Labs    09/07/21 1500 09/17/21 2325 05/16/22 0358  BNP 80.9 36.5 46.6    ProBNP (last 3 results) No results for input(s): "PROBNP" in the last 8760 hours.  CBG: Recent Labs  Lab 05/16/22 0603 05/16/22 1150 05/16/22 1630 05/16/22 2124 05/17/22 0613  GLUCAP 103* 164* 126* 135* 137*       Signed:  Fayrene Helper MD.  [facility] 05/17/2022, 3:28 PM

## 2022-05-18 ENCOUNTER — Ambulatory Visit: Payer: BC Managed Care – PPO | Admitting: Psychology

## 2022-05-18 LAB — CULTURE, BLOOD (ROUTINE X 2)
Culture: NO GROWTH
Culture: NO GROWTH
Special Requests: ADEQUATE
Special Requests: ADEQUATE

## 2022-05-19 ENCOUNTER — Telehealth: Payer: Self-pay

## 2022-05-19 NOTE — Telephone Encounter (Signed)
Transition Care Management Unsuccessful Follow-up Telephone Call  Date of discharge and from where:  [facility]/17/[facility]  Attempts:  1st Attempt  Reason for unsuccessful TCM follow-up call:  Left voice message  Daneen Schick, Arita Miss, CDP Social Worker, Certified Dementia Practitioner [facility] Care Management [phone]

## 2022-05-21 ENCOUNTER — Other Ambulatory Visit (HOSPITAL_COMMUNITY): Payer: Self-pay

## 2022-05-21 LAB — CULTURE, BLOOD (ROUTINE X 2)
Culture: NO GROWTH
Culture: NO GROWTH
Special Requests: ADEQUATE
Special Requests: ADEQUATE

## 2022-05-25 ENCOUNTER — Ambulatory Visit (INDEPENDENT_AMBULATORY_CARE_PROVIDER_SITE_OTHER): Payer: BC Managed Care – PPO | Admitting: Psychology

## 2022-05-25 DIAGNOSIS — F331 Major depressive disorder, recurrent, moderate: Secondary | ICD-10-CM | POA: Diagnosis not present

## 2022-05-25 DIAGNOSIS — F428 Other obsessive-compulsive disorder: Secondary | ICD-10-CM | POA: Diagnosis not present

## 2022-05-25 NOTE — Progress Notes (Signed)
[facility] Counselor/Therapist Progress Note  Patient ID: JAICEY SWEANEY, MRN: [id number],    Date: 05/24/2022  Time Spent: 60 minutes  Treatment Type: Individual Therapy  Reported Symptoms: sadness,   Mental Status Exam: Appearance:  Casual     Behavior: Appropriate  Motor: Normal  Speech/Language:  Normal Rate  Affect: Appropriate  Mood: Pleasant  Thought process: normal  Thought content:   WNL  Sensory/Perceptual disturbances:   WNL  Orientation: oriented to person, place, time/date, and situation  Attention: Good  Concentration: Good  Memory: WNL  Fund of knowledge:  Good  Insight:   Good  Judgment:  Good  Impulse Control: Good   Risk Assessment: Danger to Self:  No Self-injurious Behavior: No Danger to Others: No Duty to Warn:no Physical Aggression / Violence:No  Access to Firearms a concern: No  Gang Involvement:No   Subjective: The patient attended a face-to-face individual therapy session today via video visit .  The patient gave verbal consent for the video visit to be on WebEx.  The patient was in her home alone and the therapist was in the office.   The patient presents as pleasant and cooperative.  The patient states that she was in the hospital for about 5 days in the last couple weeks.  She states that they think it was probably pneumonia.  We talked about how she is doing and she feels like she has made some good progress and is not crying all the time and feels like she is in a better place with her spouse and also with her gaining partner.  We talked about the possibility of going to every 2 weeks and she seems anxious about this.  I explained to her that the goal for therapy was that she would get to the place where she could handle things on her own.  We will continue to talk about this during our next session. Interventions: Cognitive Behavioral Therapy, Assertiveness/Communication, and Problem solving  Diagnosis:Major depressive  disorder, recurrent episode, moderate (HCC)  Obsessional thoughts  Plan: Treatment Plan  Strengths/Abilities:  Intelligent, ability for insight, supportive wife  Treatment Preferences:  Outpatient Individual therapy  Statement of Needs:  "I Need help with my depression and motivation. "  Symptoms:Depressed or irritable mood.:(Status: improved). Diminished interest in or  enjoyment of activities.(Status: improved). Feelings of hopelessness,  worthlessness, or inappropriate guilt.: (Status: improved). History of chronic or recurrent depression for which the client has taken antidepressant medication, been hospitalized, had  outpatient treatment, or had a course of electroconvulsive therapy.(Status:  maintained). Lack of energy.: (Status: maintained). Low self-esteem.:  (Status: maintained). Poor concentration and indecisiveness.:  (Status: improved). Social withdrawal.:  (Status: maintained).  Unresolved grief issues.: (Status: maintained).  Problems Addressed:  Unipolar Depression  Goals:  LTG:1. Alleviate depressive symptoms and return to previous level of effective  functioning.  60% 2. Appropriately grieve the loss in order to normalize mood and to return  to previously adaptive level of functioning. 3. Develop healthy interpersonal relationships that lead to the alleviation  and help prevent the relapse of depression.  60% 4. Develop healthy thinking patterns and beliefs about self, others, and the world that lead to the alleviation and help prevent the relapse of  Depression  60% STG:1.Describe current and past experiences with depression including their impact on functioning and  attempts to resolve it.  60 % 2. Identify and replace thoughts and beliefs that support depression.  50 % 3.Learn and implement behavioral strategies to overcome depression. 50%  4.Verbalize an understanding of healthy and unhealthy emotions with the intent of increasing the use of  healthy emotions  to guide actions.  30 %  Target Date:  10/07/2022 Frequency:  Weekly Modality:Individual Interventions by Therapist:  CBT, problem solving, EMDR, insight oriented  Patient approved Treatment Plan  Sherrick Araki G Taylie Helder, LCSW                            Henli Hey G Kensi Karr, LCSW               Prince Olivier G Lathaniel Legate, LCSW               Hawken Bielby [employer], LCSW               Mellanie Bejarano [employer], LCSW               Ginamarie Banfield [employer], LCSW               Rhyan Radler [employer], LCSW               Symphoni Helbling [employer], LCSW               Deddrick Saindon [employer], LCSW               Connell Bognar [employer], LCSW               Brytni Dray [employer], LCSW               Macie Baum [employer], LCSW               Elige Shouse [employer], LCSW               Elease Swarm [employer], LCSW               Jovani Colquhoun [employer], LCSW               Latora Quarry [employer], LCSW               Gisel Vipond [employer], LCSW               Basim Bartnik [employer], LCSW               Larsen Zettel [employer], LCSW               Brandii Lakey G Devlin Mcve[place], LCSW               Hajra Port [place], LCSW

## 2022-05-26 ENCOUNTER — Encounter: Payer: BC Managed Care – PPO | Admitting: Primary Care

## 2022-05-26 ENCOUNTER — Other Ambulatory Visit: Payer: Self-pay | Admitting: Primary Care

## 2022-05-26 ENCOUNTER — Other Ambulatory Visit (HOSPITAL_COMMUNITY): Payer: Self-pay

## 2022-05-26 ENCOUNTER — Telehealth: Payer: Self-pay | Admitting: *Deleted

## 2022-05-26 NOTE — Telephone Encounter (Signed)
Called and spoke with patient and rescheduled HFU to 06/03/22 with [facility] as she was scheduled for a virtual visit.  Advised she needs to have a CXR prior to visit and will need a walk test for a POC.  Advised that she will need 2L [place] with exertion. She verbalized understanding and stated she had no urgent needs at this time.  She was fine with rescheduling.

## 2022-05-26 NOTE — Progress Notes (Signed)
 This encounter was created in error - please disregard.

## 2022-05-27 ENCOUNTER — Other Ambulatory Visit (HOSPITAL_COMMUNITY): Payer: Self-pay

## 2022-05-27 ENCOUNTER — Telehealth: Payer: Self-pay

## 2022-05-27 MED ORDER — MODAFINIL 200 MG PO TABS
ORAL_TABLET | ORAL | 3 refills | Status: DC
  Filled 2022-05-27: qty 60, 60d supply, fill #0
  Filled 2022-07-16: qty 30, 30d supply, fill #1
  Filled 2022-08-28: qty 30, 30d supply, fill #2
  Filled 2022-10-02: qty 30, 30d supply, fill #3
  Filled 2022-11-08: qty 30, 30d supply, fill #4

## 2022-05-27 NOTE — Telephone Encounter (Signed)
This encounter was created in error - please disregard.

## 2022-05-27 NOTE — Telephone Encounter (Signed)
Refill sent Confirmed from hospitalist that they did not intend to stop this medication

## 2022-05-27 NOTE — Telephone Encounter (Signed)
Please advise on refill request

## 2022-05-27 NOTE — Telephone Encounter (Signed)
Transition Care Management Unsuccessful Follow-up Telephone Call  Date of discharge and from where:  [facility]  Attempts:  2nd Attempt  Reason for unsuccessful TCM follow-up call:  Left voice message  Quinn Plowman RN,BSN,CCM RN Case Manager [phone]

## 2022-05-30 ENCOUNTER — Encounter (INDEPENDENT_AMBULATORY_CARE_PROVIDER_SITE_OTHER): Payer: Self-pay | Admitting: Family Medicine

## 2022-05-30 ENCOUNTER — Ambulatory Visit (INDEPENDENT_AMBULATORY_CARE_PROVIDER_SITE_OTHER): Payer: BC Managed Care – PPO | Admitting: Family Medicine

## 2022-05-30 ENCOUNTER — Other Ambulatory Visit (HOSPITAL_COMMUNITY): Payer: Self-pay

## 2022-05-30 VITALS — BP 115/70 | HR 69 | Temp 97.3°F | Ht 63.0 in | Wt >= 6400 oz

## 2022-05-30 DIAGNOSIS — E1159 Type 2 diabetes mellitus with other circulatory complications: Secondary | ICD-10-CM | POA: Diagnosis not present

## 2022-05-30 DIAGNOSIS — Z6841 Body Mass Index (BMI) 40.0 and over, adult: Secondary | ICD-10-CM

## 2022-05-30 DIAGNOSIS — E669 Obesity, unspecified: Secondary | ICD-10-CM | POA: Diagnosis not present

## 2022-05-30 DIAGNOSIS — J9601 Acute respiratory failure with hypoxia: Secondary | ICD-10-CM

## 2022-05-30 DIAGNOSIS — I152 Hypertension secondary to endocrine disorders: Secondary | ICD-10-CM

## 2022-06-01 ENCOUNTER — Ambulatory Visit (INDEPENDENT_AMBULATORY_CARE_PROVIDER_SITE_OTHER): Payer: BC Managed Care – PPO | Admitting: Psychology

## 2022-06-01 DIAGNOSIS — F331 Major depressive disorder, recurrent, moderate: Secondary | ICD-10-CM

## 2022-06-01 DIAGNOSIS — F428 Other obsessive-compulsive disorder: Secondary | ICD-10-CM | POA: Diagnosis not present

## 2022-06-01 NOTE — Progress Notes (Signed)
[facility] Counselor/Therapist Progress Note  Patient ID: Alexandra Beck, MRN: [id number],    Date: 06/01/2022  Time Spent: 60 minutes  Treatment Type: Individual Therapy  Reported Symptoms: sadness,   Mental Status Exam: Appearance:  Casual     Behavior: Appropriate  Motor: Normal  Speech/Language:  Normal Rate  Affect: Appropriate  Mood: Pleasant  Thought process: normal  Thought content:   WNL  Sensory/Perceptual disturbances:   WNL  Orientation: oriented to person, place, time/date, and situation  Attention: Good  Concentration: Good  Memory: WNL  Fund of knowledge:  Good  Insight:   Good  Judgment:  Good  Impulse Control: Good   Risk Assessment: Danger to Self:  No Self-injurious Behavior: No Danger to Others: No Duty to Warn:no Physical Aggression / Violence:No  Access to Firearms a concern: No  Gang Involvement:No   Subjective: The patient attended a face-to-face individual therapy session today via video visit .  The patient gave verbal consent for the video visit to be on WebEx.  The patient was in her home alone and the therapist was in the office.   The patient presents as pleasant and cooperative.  The patient reports that she has been struggling a little since we spoke last week thinking about going to every other week.  I explained that the goal of therapy is to get better and that going to every other week means that she is being successful in therapy.  We talked about continuing to work on her self esteem.  We discussed this in relation to codependency and also talked about where her negative self esteem comes from.  We will continue to discuss transitioning to every other week.  Interventions: Cognitive Behavioral Therapy, Assertiveness/Communication, and Problem solving  Diagnosis:Major depressive disorder, recurrent episode, moderate (HCC)  Obsessional thoughts  Plan: Treatment Plan  Strengths/Abilities:  Intelligent, ability for  insight, supportive wife  Treatment Preferences:  Outpatient Individual therapy  Statement of Needs:  "I Need help with my depression and motivation. "  Symptoms:Depressed or irritable mood.:(Status: improved). Diminished interest in or  enjoyment of activities.(Status: improved). Feelings of hopelessness,  worthlessness, or inappropriate guilt.: (Status: improved). History of chronic or recurrent depression for which the client has taken antidepressant medication, been hospitalized, had  outpatient treatment, or had a course of electroconvulsive therapy.(Status:  maintained). Lack of energy.: (Status: maintained). Low self-esteem.:  (Status: maintained). Poor concentration and indecisiveness.:  (Status: improved). Social withdrawal.:  (Status: maintained).  Unresolved grief issues.: (Status: maintained).  Problems Addressed:  Unipolar Depression  Goals:  LTG:1. Alleviate depressive symptoms and return to previous level of effective  functioning.  60% 2. Appropriately grieve the loss in order to normalize mood and to return  to previously adaptive level of functioning. 3. Develop healthy interpersonal relationships that lead to the alleviation  and help prevent the relapse of depression.  60% 4. Develop healthy thinking patterns and beliefs about self, others, and the world that lead to the alleviation and help prevent the relapse of  Depression  60% STG:1.Describe current and past experiences with depression including their impact on functioning and  attempts to resolve it.  60 % 2. Identify and replace thoughts and beliefs that support depression.  50 % 3.Learn and implement behavioral strategies to overcome depression. 50% 4.Verbalize an understanding of healthy and unhealthy emotions with the intent of increasing the use of  healthy emotions to guide actions.  30 %  Target Date:  10/07/2022 Frequency:  Weekly Modality:Individual Interventions by  Therapist:  CBT, problem  solving, EMDR, insight oriented  Patient approved Treatment Plan  Shalika Arntz G Magdelene Ruark, LCSW                            Keyuna Cuthrell G Aleeha Boline, LCSW               Madellyn Denio G Dekker Verga, LCSW               [name] G Willow Shidler, LCSW               Ugochi Henzler [employer], LCSW               Siobahn Worsley [employer], LCSW               Lyrik Dockstader [employer], LCSW               Clark Clowdus [employer], LCSW               Finnian Husted [employer], LCSW               Ashlay Altieri [employer], LCSW               Lovie Agresta [employer], LCSW               Rexine Gowens [employer], LCSW               Aaronjames Kelsay [employer], LCSW               Letia Guidry [employer], LCSW               Bomani Oommen [employer], LCSW               Zakye Baby [employer], LCSW               Harriette Tovey G [place], LCSW               Ulla Mckiernan G [place], LCSW               Inri Sobieski G [place], LCSW               Shakaya Bhullar G [place], LCSW               Carmon Sahli G Rozena Fierro, [place], LCSW

## 2022-06-01 NOTE — Progress Notes (Signed)
Chief Complaint:   Alexandra Beck Alexandra Beck is here to discuss her progress with her Alexandra Beck treatment plan along with follow-up of her Alexandra Beck related diagnoses. Alexandra Beck is on the Category 4 Plan and states she is following her eating plan approximately 65% of the time. Alexandra Beck states she is exercising 0 minutes 0 times per week.  Today's visit was #: 51 Starting weight: 468 lbs Starting date: 04/17/2018 Today's weight: 435 lbs Today's date: 05/30/2022 Total lbs lost to date: 33 lbs Total lbs lost since last in-office visit: 0  Interim History: Alexandra Beck was recently hospitalized for 4 days due to presumed pneumonia. She was discharged on oxygen with activity. She did get quite a bit of fluid while in hospital. Overall she is feeling better. Going to [employer] with scoop of ice cream for her birthday. Realizes she is not getting enough protein in daily.  Subjective:   1. Hypertension associated with diabetes (Alexandra Beck [place]) Alexandra Beck's blood pressure is well managed today. She has a cardiologist.  2. Acute respiratory failure with hypoxia (Alexandra Beck) Alexandra Beck was sent home from hospital on oxygen with activity.  Assessment/Plan:   1. Hypertension associated with diabetes ([place]) She will follow up with cardio for medication management.  2. Acute respiratory failure with hypoxia (Alexandra Beck) She will follow up with pulmonary next week.   3. Alexandra Beck with current BMI of 77.2 Alexandra Beck is currently in the action stage of change. As such, her goal is to continue with weight loss efforts. She has agreed to the Category 4 Plan.   Exercise goals: No exercise has been prescribed at this time.  Behavioral modification strategies: increasing lean protein intake, meal planning and cooking strategies, keeping healthy foods in the home, and planning for success.  Alexandra Beck has agreed to follow-up with our clinic in 4 weeks. She was informed of the importance of frequent follow-up visits to maximize her success with intensive lifestyle  modifications for her multiple health conditions.   Objective:   Blood pressure 115/70, pulse 69, temperature (!) 97.3 F (36.3 C), height '5\' 3"'$  (1.6 m), weight (!) 435 lb (197.3 kg), last menstrual period 05/06/2022, SpO2 95 %. Body mass index is 77.06 kg/m.  General: Cooperative, alert, well developed, in no acute distress. HEENT: Conjunctivae and lids unremarkable. Cardiovascular: Regular rhythm.  Lungs: Normal work of breathing. Neurologic: No focal deficits.   Lab Results  Component Value Date   CREATININE 1.16 (H) 05/17/2022   BUN 18 05/17/2022   NA 140 05/17/2022   K 3.5 05/17/2022   CL 100 05/17/2022   CO2 29 05/17/2022   Lab Results  Component Value Date   ALT 34 05/17/2022   AST 31 05/17/2022   ALKPHOS 93 05/17/2022   BILITOT 0.4 05/17/2022   Lab Results  Component Value Date   HGBA1C 5.8 (H) 05/13/2022   HGBA1C 5.9 (H) 11/11/2020   HGBA1C 5.6 12/17/2019   HGBA1C 5.6 07/04/2019   HGBA1C 5.4 12/04/2018   Lab Results  Component Value Date   INSULIN 23.6 11/11/2020   INSULIN 17.1 12/17/2019   INSULIN 23.5 07/04/2019   INSULIN 25.8 (H) 12/04/2018   INSULIN 24.7 07/25/2018   Lab Results  Component Value Date   TSH 2.94 01/08/2021   Lab Results  Component Value Date   CHOL 185 11/11/2020   HDL 53 11/11/2020   LDLCALC 103 (H) 11/11/2020   LDLDIRECT 141.0 03/02/2016   TRIG 169 (H) 11/11/2020   CHOLHDL 4 03/23/2018   Lab Results  Component Value Date  VD25OH 30.2 11/11/2020   VD25OH 38.2 12/17/2019   VD25OH 34.6 07/04/2019   Lab Results  Component Value Date   WBC 6.3 05/17/2022   HGB 10.5 (L) 05/17/2022   HCT 34.1 (L) 05/17/2022   MCV 85.7 05/17/2022   PLT 193 05/17/2022   No results found for: "IRON", "TIBC", "FERRITIN"  Attestation Statements:   Reviewed by clinician on day of visit: allergies, medications, problem list, medical history, surgical history, family history, social history, and previous encounter notes.  I, Elnora Morrison, RMA am acting as transcriptionist for Coralie Common, MD.  I have reviewed the above documentation for accuracy and completeness, and I agree with the above. - Coralie Common, MD

## 2022-06-02 ENCOUNTER — Other Ambulatory Visit: Payer: Self-pay | Admitting: Family

## 2022-06-02 DIAGNOSIS — E039 Hypothyroidism, unspecified: Secondary | ICD-10-CM

## 2022-06-03 ENCOUNTER — Other Ambulatory Visit: Payer: Self-pay | Admitting: Family

## 2022-06-03 ENCOUNTER — Ambulatory Visit (INDEPENDENT_AMBULATORY_CARE_PROVIDER_SITE_OTHER): Payer: BC Managed Care – PPO

## 2022-06-03 ENCOUNTER — Other Ambulatory Visit (INDEPENDENT_AMBULATORY_CARE_PROVIDER_SITE_OTHER): Payer: Self-pay | Admitting: Family Medicine

## 2022-06-03 ENCOUNTER — Other Ambulatory Visit (HOSPITAL_COMMUNITY): Payer: Self-pay

## 2022-06-03 ENCOUNTER — Telehealth: Payer: Self-pay

## 2022-06-03 ENCOUNTER — Encounter: Payer: Self-pay | Admitting: Nurse Practitioner

## 2022-06-03 ENCOUNTER — Ambulatory Visit (INDEPENDENT_AMBULATORY_CARE_PROVIDER_SITE_OTHER): Payer: BC Managed Care – PPO | Admitting: Nurse Practitioner

## 2022-06-03 VITALS — BP 98/68 | HR 72 | Ht 63.0 in | Wt >= 6400 oz

## 2022-06-03 DIAGNOSIS — G4733 Obstructive sleep apnea (adult) (pediatric): Secondary | ICD-10-CM | POA: Diagnosis not present

## 2022-06-03 DIAGNOSIS — I5032 Chronic diastolic (congestive) heart failure: Secondary | ICD-10-CM

## 2022-06-03 DIAGNOSIS — J189 Pneumonia, unspecified organism: Secondary | ICD-10-CM

## 2022-06-03 DIAGNOSIS — E039 Hypothyroidism, unspecified: Secondary | ICD-10-CM

## 2022-06-03 DIAGNOSIS — J9601 Acute respiratory failure with hypoxia: Secondary | ICD-10-CM | POA: Diagnosis not present

## 2022-06-03 DIAGNOSIS — G47419 Narcolepsy without cataplexy: Secondary | ICD-10-CM

## 2022-06-03 MED ORDER — LEVOTHYROXINE SODIUM 150 MCG PO TABS
150.0000 ug | ORAL_TABLET | Freq: Every day | ORAL | 0 refills | Status: DC
  Filled 2022-06-03: qty 30, 30d supply, fill #0

## 2022-06-03 NOTE — Assessment & Plan Note (Signed)
Stable.Continue Provigil '300mg'$  daily in the morning (prior Josem Kaufmann is good through October 2023)

## 2022-06-03 NOTE — Progress Notes (Signed)
$'@Patient'O$  ID: Alexandra Beck, female    DOB: 1981-04-11, 41 y.o.   MRN: [id number]  Chief Complaint  Patient presents with   [facility] f/u, here to have a walk to see if she qualifies for oxygen, on 2L while in the hospital during exertion & RA at rest    Referring provider: Biagio Borg, MD  HPI: 41 year old female, former remote smoker followed for OSA/OHS on CPAP and narcolepsy without cataplexy.  She is a patient of Dr. Bari Mantis and last seen in office 04/29/2022 by Volanda Napoleon NP.  Past medical history significant for venous insufficiency, hypertension, CHF with EF as low as 20% in 2001 but recovered to 55%, GERD, hypothyroid, DM 2, morbid obesity, HLD, depression, prolonged QT interval.  She previously underwent evaluation for DOE with unremarkable PFTs in 2022.  She had adjustments made to her torsemide and breathing improved.  She was recently hospitalized from 05/13/2022 to 05/17/2022 for acute respiratory failure.  She went into the ED with shortness of breath and fevers.  Treated for suspected With antibiotics given her recent URI symptoms, fever, cough and shortness of breath.  She had a CT scan that showed pulmonary edema and vascular congestion.  She was diuresed with good response and improved oxygen requirements.  She was able to be discharged on 6/20 on 2 L supplemental O2 with activity.  TEST/EVENTS:  2009 NPSG: AHI 93/h 05/2018 CPAP titration: CPAP 15+3 L O2 (434 pounds) MSLT >> SOREMs x3 09/2021 PFTs: Moderate restriction, no airway obstruction 10/27/2021 PFTs: FVC 63, FEV1 67, ratio 89, TLC 81, DLCOcor 94.  No BD 05/14/2022 echocardiogram: EF 55 to 60%.  Unable to evaluate LV diastolic dysfunction.  RV size and function is normal. 05/15/2022 CTA chest: No evidence of large pulmonary emboli; however bolus technique was poor and so unable to fully exclude small PE.  No LAD is present.  There is mild bilateral perihilar groundglass pulmonary infiltrate most suggestive of  pulmonary edema.  06/03/2022: Today-follow-up Patient presents today for hospital follow-up.  She reports since being discharged she has felt much better.  She still has an occasional cough but this is slowly improving.  She has not had any further shortness of breath or fevers.  Her weight continues to downtrend and she has gone from 204.6 kg upon admission 299.5 kg today.  Swelling in her legs has also significantly improved. She denies any fevers, night sweats, palpitations, shortness of breath, wheezing. She wears her CPAP nightly. She has not been using her oxygen, aside from at night, at home but has also not been checking her O2 levels.   Allergies  Allergen Reactions   Aspirin Hives and Swelling    REACTION: throat swelling, hives Other reaction(s): Unknown Other reaction(s): Unknown   Doxycycline Other (See Comments)    Abdominal pain, difficulty swallowing   Lisinopril Cough   Niacin And Related     Other reaction(s): Unknown Other reaction(s): Unknown   Niaspan [Niacin Er]     Caused flushing    Immunization History  Administered Date(s) Administered   Influenza,inj,Quad PF,6+ Mos 08/02/2013, 08/28/2016, 10/11/2017, 07/23/2019, 10/27/2021   PFIZER(Purple Top)SARS-COV-2 Vaccination 02/06/2020, 02/27/2020   Pneumococcal Polysaccharide-23 12/28/2012   Tdap 08/26/2015    Past Medical History:  Diagnosis Date   Acute renal failure (ARF) ([place]) 11/2012    multifactorial-likely secondary to ATN in the setting of sepsis and hypotension, and also from rhabdomyolysis   Anemia    Cellulitis    Chronic diastolic  CHF (congestive heart failure) (HCC)    Depression    Diabetes mellitus (HCC)    GERD (gastroesophageal reflux disease)    Hyperlipidemia    Hypertension    Hypothyroidism    Morbid obesity with BMI of 70 and over, adult ([place])    Obesity hypoventilation syndrome (HCC)    OSA (obstructive sleep apnea)    PVC's (premature ventricular contractions)    Recurrent  cellulitis of lower leg    LLE, venous insuff   Sepsis ([place]) 11/2012   Secondary to cellulitis   Sleep apnea    Venous insufficiency of leg     Tobacco History: Social History   Tobacco Use  Smoking Status Former   Packs/day: 0.50   Years: 8.00   Total pack years: 4.00   Types: Cigarettes   Quit date: 2011   Years since quitting: 12.5  Smokeless Tobacco Never   Counseling given: Not Answered   Outpatient Medications Prior to Visit  Medication Sig Dispense Refill   acetaminophen (TYLENOL) 500 MG tablet Take 1,000-2,000 mg by mouth every 6 (six) hours as needed for mild pain.     Brexpiprazole (REXULTI) 0.5 MG TABS Take 1 tablet by mouth daily. 30 tablet 2   ibuprofen (ADVIL) 200 MG tablet Take 800 mg by mouth every 6 (six) hours as needed for headache or moderate pain.     losartan (COZAAR) 100 MG tablet Take 1 tablet (100 mg total) by mouth daily. 90 tablet 3   metFORMIN (GLUCOPHAGE) 500 MG tablet TAKE 1 TABLET(500 MG) BY MOUTH DAILY (Patient taking differently: Take 500 mg by mouth daily with breakfast. Y) 90 tablet 3   metoprolol succinate (TOPROL-XL) 50 MG 24 hr tablet TAKE 1 TABLET BY MOUTH DAILY WITH OR IMMEDIATELY FOLLOWING A MEAL (Patient taking differently: Take 50 mg by mouth daily.) 90 tablet 3   modafinil (PROVIGIL) 100 MG tablet Take '100mg'$  tablet by mouth daily at 9am along with 200 mg tablet daily at 9am (total '300mg'$ ) 60 tablet 3   modafinil (PROVIGIL) 200 MG tablet Take 1 tablet by mouth daily at 9am (along with 100 mg tablet daily at 9am (total '300mg'$ )) 60 tablet 3   rosuvastatin (CRESTOR) 10 MG tablet TAKE 1 TABLET(10 MG) BY MOUTH DAILY (Patient taking differently: Take 10 mg by mouth daily.) 90 tablet 3   Semaglutide, 1 MG/DOSE, 4 MG/3ML SOPN Inject 1 mg as directed once a week. 3 mL 0   sertraline (ZOLOFT) 100 MG tablet Take 1 tablet by mouth 2 times daily. (Patient taking differently: Take 200 mg by mouth daily.) 180 tablet 3   spironolactone (ALDACTONE) 25 MG  tablet Take 1 tablet (25 mg total) by mouth daily. 90 tablet 0   torsemide (DEMADEX) 20 MG tablet Take 3 tablets (60 mg total) by mouth in the morning AND 2 tablets (40 mg total) every evening as needed (Patient taking differently: Take 3 tablets (60 mg total) by mouth in the morning AND 2 tablets (40 mg total) every evening as needed for fluid) 300 tablet 0   traMADol (ULTRAM) 50 MG tablet Take 1 tablet (50 mg total) by mouth every 8 (eight) hours as needed. (Patient taking differently: Take 50 mg by mouth every 8 (eight) hours as needed for moderate pain.) 20 tablet 0   levothyroxine (SYNTHROID) 150 MCG tablet Take 1 tablet (150 mcg total) by mouth daily before breakfast. 90 tablet 2   cyclobenzaprine (FLEXERIL) 10 MG tablet Take 1 tablet (10 mg total) by mouth 3 (  three) times daily. (Patient not taking: Reported on 06/03/2022) 30 tablet 0   No facility-administered medications prior to visit.     Review of Systems:   Constitutional: No weight loss or gain, night sweats, fevers, chills, fatigue, or lassitude. HEENT: No headaches, difficulty swallowing, tooth/dental problems, or sore throat. No sneezing, itching, ear ache, nasal congestion, or post nasal drip CV:  +swelling in lower extremities (significant improvement). No chest pain, orthopnea, PND, anasarca, dizziness, palpitations, syncope Resp: +congested cough, improving. No shortness of breath with exertion or at rest. No excess mucus or change in color of mucus. No productive or non-productive. No hemoptysis. No wheezing.  No chest wall deformity Skin: No rash, lesions, ulcerations MSK:  No joint pain or swelling.  No decreased range of motion.  No back pain. Neuro: No dizziness or lightheadedness.  Psych: No depression or anxiety. Mood stable.     Physical Exam:  BP 98/68 (BP Location: Left Arm, Patient Position: Sitting, Cuff Size: Normal) Comment (Cuff Size): lower arm  Pulse 72   Ht '5\' 3"'$  (1.6 m)   Wt (!) 439 lb 12.8 oz (199.5  kg)   LMP 05/06/2022   SpO2 94%   BMI 77.91 kg/m   GEN: Pleasant, interactive, well-appearing; morbidly obese; in no acute distress. HEENT:  Normocephalic and atraumatic. PERRLA. Sclera white. Nasal turbinates pink, moist and patent bilaterally. No rhinorrhea present. Oropharynx pink and moist, without exudate or edema. No lesions, ulcerations, or postnasal drip.  NECK:  Supple w/ fair ROM. No JVD present.  CV: RRR, no m/r/g, no peripheral edema. Pulses intact, +2 bilaterally. No cyanosis, pallor or clubbing. PULMONARY:  Unlabored, regular breathing. Clear bilaterally A&P w/o wheezes/rales/rhonchi. No accessory muscle use. No dullness to percussion. GI: BS present and normoactive. Soft, non-tender to palpation.  MSK: No erythema, warmth or tenderness. Cap refil <2 sec all extrem. No deformities or joint swelling noted.  Neuro: A/Ox3. No focal deficits noted.   Skin: Warm, no lesions or rashe Psych: Normal affect and behavior. Judgement and thought content appropriate.     Lab Results:  CBC    Component Value Date/Time   WBC 6.3 05/17/2022 0351   RBC 3.98 05/17/2022 0351   HGB 10.5 (L) 05/17/2022 0351   HGB 10.7 (L) 09/14/2021 1332   HCT 34.1 (L) 05/17/2022 0351   HCT 34.3 09/14/2021 1332   PLT 193 05/17/2022 0351   PLT 229 09/14/2021 1332   MCV 85.7 05/17/2022 0351   MCV 87 09/14/2021 1332   MCH 26.4 05/17/2022 0351   MCHC 30.8 05/17/2022 0351   RDW 16.4 (H) 05/17/2022 0351   RDW 15.0 09/14/2021 1332   LYMPHSABS 1.7 05/17/2022 0351   LYMPHSABS 1.4 07/25/2018 1234   MONOABS 0.7 05/17/2022 0351   EOSABS 0.3 05/17/2022 0351   EOSABS 0.2 07/25/2018 1234   BASOSABS 0.0 05/17/2022 0351   BASOSABS 0.0 07/25/2018 1234    BMET    Component Value Date/Time   NA 140 05/17/2022 0351   NA 141 09/14/2021 1331   K 3.5 05/17/2022 0351   CL 100 05/17/2022 0351   CO2 29 05/17/2022 0351   GLUCOSE 113 (H) 05/17/2022 0351   BUN 18 05/17/2022 0351   BUN 26 (H) 09/14/2021 1331    CREATININE 1.16 (H) 05/17/2022 0351   CREATININE 1.06 11/14/2016 1050   CALCIUM 9.0 05/17/2022 0351   CALCIUM 9.6 06/26/2007 0000   GFRNONAA >60 05/17/2022 0351   GFRAA 81 11/11/2020 1211    BNP    Component  Value Date/Time   BNP 46.6 05/16/2022 0358     Imaging:  DG Chest 2 View  Result Date: 06/03/2022 CLINICAL DATA:  Pneumonia EXAM: CHEST - 2 VIEW COMPARISON:  Chest x-ray dated December 15, 2021 FINDINGS: The heart size and mediastinal contours are within normal limits. Both lungs are clear. The visualized skeletal structures are unremarkable. IMPRESSION: No active cardiopulmonary disease. Electronically Signed   By: Yetta Glassman M.D.   On: 06/03/2022 12:06   CT Angio Chest Pulmonary Embolism (PE) W or WO Contrast  Result Date: 05/15/2022 CLINICAL DATA:  Chest pain or SOB, pleurisy or effusion suspected EXAM: CT ANGIOGRAPHY CHEST WITH CONTRAST TECHNIQUE: Multidetector CT imaging of the chest was performed using the standard protocol during bolus administration of intravenous contrast. Multiplanar CT image reconstructions and MIPs were obtained to evaluate the vascular anatomy. RADIATION DOSE REDUCTION: This exam was performed according to the departmental dose-optimization program which includes automated exposure control, adjustment of the mA and/or kV according to patient size and/or use of iterative reconstruction technique. CONTRAST:  140m OMNIPAQUE IOHEXOL 350 MG/ML SOLN COMPARISON:  09/18/2021 FINDINGS: Cardiovascular: Poor bolus technique and low-dose measurements result in a technically limited study which is adequate only for exclusion of large pulmonary emboli within the main and right and left pulmonary arteries. Lobar, segmental, and subsegmental pulmonary arteries are not adequately opacified to definitively exclude the presence of small pulmonary emboli. Central pulmonary arteries are of normal caliber. Cardiac size is mildly enlarged. No significant coronary artery  calcification. No pericardial effusion. Thoracic aorta is unremarkable. Mediastinum/Nodes: No enlarged mediastinal, hilar, or axillary lymph nodes. Thyroid gland, trachea, and esophagus demonstrate no significant findings. Lungs/Pleura: There has developed mild bilateral perihilar ground-glass pulmonary infiltrate most suggestive of a perihilar pulmonary edema. No confluent pulmonary infiltrate. No pneumothorax or pleural effusion. Central airways are widely patent. Upper Abdomen: No acute abnormality. Musculoskeletal: No acute bone abnormality. Review of the MIP images confirms the above findings. IMPRESSION: 1. Technically limited examination with poor bolus technique and low-dose measurements. No large central pulmonary emboli identified. 2. Interval development of mild perihilar pulmonary edema and mild cardiomegaly. This may reflect changes of congestive heart failure. Correlation with serum BNP may be helpful for further evaluation. Electronically Signed   By: AFidela SalisburyM.D.   On: 05/15/2022 18:21   DG Chest 2 View  Result Date: 05/15/2022 CLINICAL DATA:  Shortness of breath.  Cough. EXAM: CHEST - 2 VIEW COMPARISON:  Two-view chest x-ray 05/13/2022 FINDINGS: Heart size exaggerated by low lung volumes. Mild pulmonary vascular congestion is stable. No edema or effusion. No focal airspace disease is present. IMPRESSION: Low lung volumes and mild pulmonary vascular congestion. Electronically Signed   By: CSan MorelleM.D.   On: 05/15/2022 12:11   ECHOCARDIOGRAM COMPLETE  Result Date: 05/14/2022    ECHOCARDIOGRAM REPORT   Patient Name:   LTONIETTE DEVERADate of Exam: 05/14/2022 Medical Rec #:  [id number]    Height:       62.0 in Accession #:    [id number]   Weight:       451.1 lb Date of Birth:  726-Jun-1982     BSA:          2.701 m Patient Age:    40 years      BP:           128/68 mmHg Patient Gender: F             HR:  85 bpm. Exam Location:  Inpatient Procedure: 2D Echo and  Intracardiac Opacification Agent Indications:    abnormal ecg  History:        Patient has prior history of Echocardiogram examinations, most                 recent 09/18/2021. CHF; Risk Factors:Hypertension, Diabetes and                 Sleep Apnea.  Sonographer:    Johny Chess RDCS Referring Phys: [id number] [place] Shi  Sonographer Comments: Patient is morbidly obese. Image acquisition challenging due to patient body habitus. IMPRESSIONS  1. Technically diffcult study d/t body habitus. Definity contrast was administered.  2. Left ventricular ejection fraction, by estimation, is 55 to 60%. The left ventricle has normal function. The left ventricle has no regional wall motion abnormalities. Left ventricular diastolic function could not be evaluated.  3. Right ventricular systolic function is normal. The right ventricular size is normal.  4. Left atrial size was mildly dilated.  5. The mitral valve is grossly normal. No evidence of mitral valve regurgitation.  6. The aortic valve was not well visualized. Aortic valve regurgitation is not visualized.  7. The inferior vena cava is normal in size with greater than 50% respiratory variability, suggesting right atrial pressure of 3 mmHg. Comparison(s): Changes from prior study are noted. 09/18/2021: LVEF 60-65%. FINDINGS  Left Ventricle: Left ventricular ejection fraction, by estimation, is 55 to 60%. The left ventricle has normal function. The left ventricle has no regional wall motion abnormalities. Definity contrast agent was given IV to delineate the left ventricular  endocardial borders. The left ventricular internal cavity size was normal in size. There is no left ventricular hypertrophy. Left ventricular diastolic function could not be evaluated. Right Ventricle: The right ventricular size is normal. No increase in right ventricular wall thickness. Right ventricular systolic function is normal. Left Atrium: Left atrial size was mildly dilated. Right  Atrium: Right atrial size was normal in size. Pericardium: There is no evidence of pericardial effusion. Mitral Valve: The mitral valve is grossly normal. No evidence of mitral valve regurgitation. Tricuspid Valve: The tricuspid valve is not well visualized. Tricuspid valve regurgitation is not demonstrated. Aortic Valve: The aortic valve was not well visualized. Aortic valve regurgitation is not visualized. Pulmonic Valve: The pulmonic valve was not well visualized. Pulmonic valve regurgitation is not visualized. Aorta: The ascending aorta was not well visualized. Venous: The inferior vena cava is normal in size with greater than 50% respiratory variability, suggesting right atrial pressure of 3 mmHg. IAS/Shunts: There is right bowing of the interatrial septum, suggestive of elevated left atrial pressure. The interatrial septum was not well visualized.  LEFT VENTRICLE PLAX 2D LVIDd:         5.70 cm LVIDs:         4.10 cm LV PW:         1.10 cm LV IVS:        1.00 cm  RIGHT VENTRICLE             IVC RV S prime:     18.10 cm/s  IVC diam: 1.70 cm TAPSE (M-mode): 2.8 cm LEFT ATRIUM             Index        RIGHT ATRIUM           Index LA diam:        3.90 cm 1.44 cm/m   RA Area:  15.10 cm LA Vol (A2C):   89.6 ml 33.17 ml/m  RA Volume:   39.70 ml  14.70 ml/m LA Vol (A4C):   86.9 ml 32.17 ml/m LA Biplane Vol: 95.9 ml 35.50 ml/m  AORTIC VALVE LVOT Vmax:   121.00 cm/s LVOT Vmean:  80.900 cm/s LVOT VTI:    0.224 m  SHUNTS Systemic VTI: 0.22 m Lyman Bishop MD Electronically signed by Lyman Bishop MD Signature Date/Time: 05/14/2022/4:52:34 PM    Final    DG Chest 2 View  Result Date: 05/13/2022 CLINICAL DATA:  Shortness of breath, fever, dizziness EXAM: CHEST - 2 VIEW COMPARISON:  Previous studies including the examination of 09/09/2021 FINDINGS: Cardiac size is within normal limits. There are no signs of pulmonary edema. Subtle increased density in both lower lung fields may be an artifact due to chest wall  attenuation. Less likely possibility would be early pneumonia. Cardiac and diaphragmatic margins are preserved. Costophrenic angles are clear. There is no pneumothorax. IMPRESSION: Subtle increased density in the lower lung fields may be an artifact due to chest wall attenuation. Less likely possibility would be early pneumonia. There are no signs of alveolar pulmonary edema. Electronically Signed   By: Elmer Picker M.D.   On: 05/13/2022 13:09         Latest Ref Rng & Units 10/27/2021   12:14 PM  PFT Results  FVC-Pre L 2.17   FVC-Predicted Pre % 63   FVC-Post L 2.25   FVC-Predicted Post % 65   Pre FEV1/FVC % % 87   Post FEV1/FCV % % 89   FEV1-Pre L 1.89   FEV1-Predicted Pre % 67   FEV1-Post L 2.01   DLCO uncorrected ml/min/mmHg 19.27   DLCO UNC% % 94   DLCO corrected ml/min/mmHg 19.27   DLCO COR %Predicted % 94   DLVA Predicted % 118   TLC L 3.88   TLC % Predicted % 81   RV % Predicted % 94     No results found for: "NITRICOXIDE"      Assessment & Plan:   Pneumonia Recent hospitalization for acute respiratory failure.  Treated for suspicion of pneumonia based on clinical presentation and symptoms.  She also had evidence of pulmonary edema.  She was treated with antibiotics and IV diuresis.  She has clinically improved since discharge.  Still has an occasional congested cough but it is slowly improving.  CXR today was clear.  Patient Instructions  Continue to use CPAP with oxygen bled through every night, minimum of 4-6 hours a night.  Change equipment every 30 days or as directed by DME. Wash your tubing with warm soap and water daily, hang to dry. Wash humidifier portion weekly.   Be aware of reduced alertness and do not drive or operate heavy machinery if experiencing this or drowsiness.  Exercise encouraged, as tolerated. Avoid or decrease alcohol consumption and medications that make you more sleepy, if possible. Notify if persistent daytime sleepiness occurs  even with consistent use of CPAP.  Continue torsemide 3 tablets in the morning and 2 tablets in the evening for fluid as prescribed by your primary care doctor   Monitor oxygen levels at home; goal >88-90%. Notify if you notice levels below this.   Follow up with Dr. Elsworth Soho in 8 weeks for CPAP compliance check since you started on your new machine. If symptoms do not improve or worsen, please contact office for sooner follow up or seek emergency care.     Acute respiratory failure with  hypoxia ([place]) Resolved.  She was walked today on room air and was able to maintain oxygen saturations greater than 94%.  Advised her to monitor her levels at home and notify if she is dropping below 88 to 90%.  She will continue her nocturnal oxygen bled through her CPAP at 3 L/min.  OSA (obstructive sleep apnea) Excellent compliance and continues to receive good benefit.  She just started on her new machine last week so we will plan for compliance check in 8 weeks for insurance purposes.  Appeared well controlled on download today.  Continue CPAP nightly 12-20 cmH2O with 3 L/min bled through.  Chronic diastolic CHF (congestive heart failure) ([place]) Appears compensated on exam.  She is down approximately 3 to 4 pounds since discharge.  Doing well on her twice daily dosing of torsemide.  Follow-up with PCP as scheduled.  Narcolepsy without cataplexy Stable.Continue Provigil '300mg'$  daily in the morning (prior Josem Kaufmann is good through October 2023)   I spent 32 minutes of dedicated to the care of this patient on the date of this encounter to include pre-visit review of records, face-to-face time with the patient discussing conditions above, post visit ordering of testing, clinical documentation with the electronic health record, making appropriate referrals as documented, and communicating necessary findings to members of the patients care team.  Clayton Bibles, NP 06/03/2022  Pt aware and understands NP's role.

## 2022-06-03 NOTE — Assessment & Plan Note (Addendum)
Resolved.  She was walked today on room air and was able to maintain oxygen saturations greater than 94%.  Advised her to monitor her levels at home and notify if she is dropping below 88 to 90%.  She will continue her nocturnal oxygen bled through her CPAP at 3 L/min.

## 2022-06-03 NOTE — Assessment & Plan Note (Addendum)
Appears compensated on exam.  She is down approximately 3 to 4 pounds since discharge.  Doing well on her twice daily dosing of torsemide.  Follow-up with PCP as scheduled.

## 2022-06-03 NOTE — Assessment & Plan Note (Addendum)
Excellent compliance and continues to receive good benefit.  She just started on her new machine last week so we will plan for compliance check in 8 weeks for insurance purposes.  Appeared well controlled on download today.  Continue CPAP nightly 12-20 cmH2O with 3 L/min bled through.

## 2022-06-03 NOTE — Progress Notes (Signed)
Please notify patient CXR was clear. Thanks

## 2022-06-03 NOTE — Patient Instructions (Addendum)
Continue to use CPAP with oxygen bled through every night, minimum of 4-6 hours a night.  Change equipment every 30 days or as directed by DME. Wash your tubing with warm soap and water daily, hang to dry. Wash humidifier portion weekly.   Be aware of reduced alertness and do not drive or operate heavy machinery if experiencing this or drowsiness.  Exercise encouraged, as tolerated. Avoid or decrease alcohol consumption and medications that make you more sleepy, if possible. Notify if persistent daytime sleepiness occurs even with consistent use of CPAP.  Continue Provigil '300mg'$  daily in the morning   Continue torsemide 3 tablets in the morning and 2 tablets in the evening for fluid as prescribed by your primary care doctor   Monitor oxygen levels at home; goal >88-90%. Notify if you notice levels below this.   Follow up with Dr. Elsworth Soho in 8 weeks for CPAP compliance check since you started on your new machine. If symptoms do not improve or worsen, please contact office for sooner follow up or seek emergency care.

## 2022-06-03 NOTE — Assessment & Plan Note (Signed)
Recent hospitalization for acute respiratory failure.  Treated for suspicion of pneumonia based on clinical presentation and symptoms.  She also had evidence of pulmonary edema.  She was treated with antibiotics and IV diuresis.  She has clinically improved since discharge.  Still has an occasional congested cough but it is slowly improving.  CXR today was clear.  Patient Instructions  Continue to use CPAP with oxygen bled through every night, minimum of 4-6 hours a night.  Change equipment every 30 days or as directed by DME. Wash your tubing with warm soap and water daily, hang to dry. Wash humidifier portion weekly.   Be aware of reduced alertness and do not drive or operate heavy machinery if experiencing this or drowsiness.  Exercise encouraged, as tolerated. Avoid or decrease alcohol consumption and medications that make you more sleepy, if possible. Notify if persistent daytime sleepiness occurs even with consistent use of CPAP.  Continue torsemide 3 tablets in the morning and 2 tablets in the evening for fluid as prescribed by your primary care doctor   Monitor oxygen levels at home; goal >88-90%. Notify if you notice levels below this.   Follow up with Dr. Elsworth Soho in 8 weeks for CPAP compliance check since you started on your new machine. If symptoms do not improve or worsen, please contact office for sooner follow up or seek emergency care.

## 2022-06-03 NOTE — Telephone Encounter (Signed)
Pt is requesting a short fill on: levothyroxine (SYNTHROID) 150 MCG tablet  Pharmacy: [facility]  LOV 09/15/21 ROV 06/17/22

## 2022-06-06 ENCOUNTER — Other Ambulatory Visit (HOSPITAL_COMMUNITY): Payer: Self-pay

## 2022-06-07 DIAGNOSIS — J9601 Acute respiratory failure with hypoxia: Secondary | ICD-10-CM

## 2022-06-07 NOTE — Telephone Encounter (Signed)
She has had nocturnal supplemental O2 bled through her CPAP for quite some time now so she needs to keep her home oxygen concentrator. No changes were made to her previous settings on CPAP/nocturnal O2. She can return the portable tanks since she has the concentrator at home, should a problem arise and she need O2. Thanks.

## 2022-06-08 ENCOUNTER — Ambulatory Visit (INDEPENDENT_AMBULATORY_CARE_PROVIDER_SITE_OTHER): Payer: BC Managed Care – PPO | Admitting: Psychology

## 2022-06-08 DIAGNOSIS — F428 Other obsessive-compulsive disorder: Secondary | ICD-10-CM

## 2022-06-08 DIAGNOSIS — F331 Major depressive disorder, recurrent, moderate: Secondary | ICD-10-CM

## 2022-06-09 NOTE — Progress Notes (Signed)
[facility] Counselor/Therapist Progress Note  Patient ID: Alexandra Beck, MRN: [id number],    Date: 06/08/2022  Time Spent: 60 minutes  Treatment Type: Individual Therapy  Reported Symptoms: sadness,   Mental Status Exam: Appearance:  Casual     Behavior: Appropriate  Motor: Normal  Speech/Language:  Normal Rate  Affect: Appropriate  Mood: Pleasant  Thought process: normal  Thought content:   WNL  Sensory/Perceptual disturbances:   WNL  Orientation: oriented to person, place, time/date, and situation  Attention: Good  Concentration: Good  Memory: WNL  Fund of knowledge:  Good  Insight:   Good  Judgment:  Good  Impulse Control: Good   Risk Assessment: Danger to Self:  No Self-injurious Behavior: No Danger to Others: No Duty to Warn:no Physical Aggression / Violence:No  Access to Firearms a concern: No  Gang Involvement:No   Subjective: The patient attended a face-to-face individual therapy session today via video visit .  The patient gave verbal consent for the video visit to be on WebEx.  The patient was in her home alone and the therapist was in the office.   The patient presents as pleasant and cooperative.  The patient reports that she believes she is now ready to go to every other week for therapy appointments.  It took her a while to adjust to the idea of doing this however she is doing much better with her depression since starting therapy.  We talked about a lot of the tools that we have discussed during therapy and I encouraged her to utilize these tools in between sessions.  The patient is making good progress and is showering twice a week as well as no crying spells. Interventions: Cognitive Behavioral Therapy, Assertiveness/Communication, and Problem solving  Diagnosis:Major depressive disorder, recurrent episode, moderate (HCC)  Obsessional thoughts  Plan: Treatment Plan  Strengths/Abilities:  Intelligent, ability for insight, supportive  wife  Treatment Preferences:  Outpatient Individual therapy  Statement of Needs:  "I Need help with my depression and motivation. "  Symptoms:Depressed or irritable mood.:(Status: improved). Diminished interest in or  enjoyment of activities.(Status: improved). Feelings of hopelessness,  worthlessness, or inappropriate guilt.: (Status: improved). History of chronic or recurrent depression for which the client has taken antidepressant medication, been hospitalized, had  outpatient treatment, or had a course of electroconvulsive therapy.(Status:  maintained). Lack of energy.: (Status: maintained). Low self-esteem.:  (Status: maintained). Poor concentration and indecisiveness.:  (Status: improved). Social withdrawal.:  (Status: maintained).  Unresolved grief issues.: (Status: maintained).  Problems Addressed:  Unipolar Depression  Goals:  LTG:1. Alleviate depressive symptoms and return to previous level of effective  functioning.  60% 2. Appropriately grieve the loss in order to normalize mood and to return  to previously adaptive level of functioning. 3. Develop healthy interpersonal relationships that lead to the alleviation  and help prevent the relapse of depression.  60% 4. Develop healthy thinking patterns and beliefs about self, others, and the world that lead to the alleviation and help prevent the relapse of  Depression  60% STG:1.Describe current and past experiences with depression including their impact on functioning and  attempts to resolve it.  60 % 2. Identify and replace thoughts and beliefs that support depression.  50 % 3.Learn and implement behavioral strategies to overcome depression. 50% 4.Verbalize an understanding of healthy and unhealthy emotions with the intent of increasing the use of  healthy emotions to guide actions.  30 %  Target Date:  10/07/2022 Frequency:  Weekly Modality:Individual Interventions by Therapist:  CBT, problem solving, EMDR, insight  oriented  Patient approved Treatment Plan  Wael Maestas G Shiara Mcgough, LCSW                            Akshath Mccarey G Karsen Fellows, LCSW               Tinesha Siegrist G Trenda Corliss, LCSW               Ianmichael Amescua G Anetra Czerwinski, LCSW               Jamila Slatten [employer], LCSW               Kiwanna Spraker [employer], LCSW               [name] Sheperd [employer], LCSW               Tijah Hane [employer], LCSW               Damonie Furney [employer], LCSW               Aleza Pew [employer], LCSW               Benn Tarver [employer], LCSW               Rochell Mabie [employer], LCSW               Kiandria Clum [employer], LCSW               Nikolaj Geraghty [employer], LCSW               Guiliana Shor [employer], LCSW               Murvin Gift [employer], LCSW               Calene Paradiso [employer], LCSW               Cambry Spampinato G [place], LCSW               Edina Winningham G [place], LCSW               Linzey Ramser G [place], LCSW               Buren Havey G [place], LCSW               Dory Demont G Darina Hartwell, LCSW               Kyah Buesing [employer] [place], LCSW

## 2022-06-13 ENCOUNTER — Other Ambulatory Visit (HOSPITAL_COMMUNITY): Payer: Self-pay

## 2022-06-15 ENCOUNTER — Ambulatory Visit: Payer: BC Managed Care – PPO | Admitting: Psychology

## 2022-06-16 NOTE — Progress Notes (Signed)
TeleHealth Visit:  This visit was completed with telemedicine (audio/video) technology. Alexandra Beck has verbally consented to this TeleHealth visit. The patient is located at home, the provider is located at home. The participants in this visit include the listed provider and patient. The visit was conducted today via MyChart video.  OBESITY Alexandra Beck is here to discuss her progress with her obesity treatment plan along with follow-up of her obesity related diagnoses.   Today's visit was # 4 Starting weight: 468 lbs Starting date: 04/17/2018 Weight at last in office visit: 435 lbs on 05/30/22 Total weight loss: 33 lbs at last in office visit on 05/30/22. Today's reported weight:  No weight reported.  Nutrition Plan: the Category 4 Plan - 65% adherence  Hunger is poorly controlled. Cravings are poorly controlled.  Current exercise: none  Interim History: Alexandra Beck feels that she does not get in enough protein.  She is drinking Designer, television/film set Protein with [employer] Life milk in the morning (210 calories, 23 gms protein). For lunch she tends to have the sandwich option, examples of dinner would be tacos on low-carb tortillas or [employer] on low-carb tortillas.  She is feeling well overall.  In June, she went to the ED for a fever and was hospitalized for 5 days for presumed pneumonia.  Alexandra Beck feels that she did not have pneumonia but was overloaded with fluid at the hospital which exacerbated her CHF.   Assessment/Plan:  1. Type II Diabetes HgbA1c is at goal. Last A1c was 5.8 CBGs: Not checking Symptoms of hypoglycemia: no Medication(s): Metformin 500 mg daily, Ozempic 1 mg weekly. Hunger and cravings have increased since she was switched to Ozempic due to lack of insurance coverage.  Lab Results  Component Value Date   HGBA1C 5.8 (H) 05/13/2022   HGBA1C 5.9 (H) 11/11/2020   HGBA1C 5.6 12/17/2019   Lab Results  Component Value Date   MICROALBUR 8.05 (H) 06/26/2007    LDLCALC 94 06/17/2022   CREATININE 1.16 (H) 05/17/2022    Plan: Continue metformin daily. Increase dose of Ozempic-refill Ozempic 2 mg subcu weekly.  2. CHF Feels she is holding onto some fluid, has had increased shortness of breath the past few days.  She is out of torsemide currently.  She is also on spironolactone 25 mg daily.  She said she needs to schedule follow-up with cardiology.  Torsemide prescribed twice daily by cardiology but Alexandra Beck is unable to remember to take the second dose so she takes 80 mg every morning.  Plan: She will schedule follow-up with cardiology Refill torsemide 80 mg every morning.  3. Eating disorder/emotional eating She has major depressive disorder and also struggles with with stress/emotional eating.  Cravings are worse since switching to Ozempic from Alexandra Beck.  She sees her psychiatrist around every 6 months and her counselor every 2 weeks.  She feels mood is currently stable and has been since starting [place]. Current medications: Sertraline 100 mg daily, Rexulti 0.5 mg daily.  Plan: Continue Rexulti and sertraline as ordered by psychiatry. Follow-up with psychiatry and counselor as directed. Consider topiramate for cravings if increased dose of Ozempic does not help.  Denies history of nephrolithiasis.  4. Obesity: Current BMI 77.0 Alexandra Beck is currently in the action stage of change. As such, her goal is to continue with weight loss efforts.  She has agreed to the Category 4 Plan.  May journal breakfast-350-450 cal, 30+ grams of protein. Discussed breakfast ideas.  Exercise goals: No exercise has been prescribed at this time.  Behavioral modification strategies: increasing lean protein intake, decreasing simple carbohydrates, meal planning and cooking strategies, and planning for success.  Alexandra Beck has agreed to follow-up with our clinic in 3 weeks.   No orders of the defined types were placed in this encounter.   Medications Discontinued During  This Encounter  Medication Reason   Semaglutide, 1 MG/DOSE, 4 MG/3ML SOPN Dose change   torsemide (DEMADEX) 20 MG tablet Duplicate     Meds ordered this encounter  Medications   Semaglutide, 2 MG/DOSE, 8 MG/3ML SOPN    Sig: Inject 2 mg as directed once a week.    Dispense:  3 mL    Refill:  0    Order Specific Question:   Supervising Provider    Answer:   Dennard Nip D [[id number]]   torsemide (DEMADEX) 20 MG tablet    Sig: Take 4 tablets (80 mg total) by mouth every morning.    Dispense:  120 tablet    Refill:  1    Order Specific Question:   Supervising Provider    Answer:   Dennard Nip D [[id number]]      Objective:   VITALS: Per patient if applicable, see vitals. GENERAL: Alert and in no acute distress. CARDIOPULMONARY: No increased WOB. Speaking in clear sentences.  PSYCH: Pleasant and cooperative. Speech normal rate and rhythm. Affect is appropriate. Insight and judgement are appropriate. Attention is focused, linear, and appropriate.  NEURO: Oriented as arrived to appointment on time with no prompting.   Lab Results  Component Value Date   CREATININE 1.16 (H) 05/17/2022   BUN 18 05/17/2022   NA 140 05/17/2022   K 3.5 05/17/2022   CL 100 05/17/2022   CO2 29 05/17/2022   Lab Results  Component Value Date   ALT 34 05/17/2022   AST 31 05/17/2022   ALKPHOS 93 05/17/2022   BILITOT 0.4 05/17/2022   Lab Results  Component Value Date   HGBA1C 5.8 (H) 05/13/2022   HGBA1C 5.9 (H) 11/11/2020   HGBA1C 5.6 12/17/2019   HGBA1C 5.6 07/04/2019   HGBA1C 5.4 12/04/2018   Lab Results  Component Value Date   INSULIN 23.6 11/11/2020   INSULIN 17.1 12/17/2019   INSULIN 23.5 07/04/2019   INSULIN 25.8 (H) 12/04/2018   INSULIN 24.7 07/25/2018   Lab Results  Component Value Date   TSH 4.38 06/17/2022   Lab Results  Component Value Date   CHOL 185 06/17/2022   HDL 53.70 06/17/2022   LDLCALC 94 06/17/2022   LDLDIRECT 141.0 03/02/2016   TRIG 187.0 (H) 06/17/2022    CHOLHDL 3 06/17/2022   Lab Results  Component Value Date   WBC 7.6 06/17/2022   HGB 11.1 (L) 06/17/2022   HCT 35.3 (L) 06/17/2022   MCV 83.1 06/17/2022   PLT 258.0 06/17/2022   Lab Results  Component Value Date   IRON 34 (L) 06/17/2022   TIBC 527.8 (H) 06/17/2022   FERRITIN 15.4 06/17/2022   Lab Results  Component Value Date   VD25OH 30.2 11/11/2020   VD25OH 38.2 12/17/2019   VD25OH 34.6 07/04/2019    Attestation Statements:   Reviewed by clinician on day of visit: allergies, medications, problem list, medical history, surgical history, family history, social history, and previous encounter notes.

## 2022-06-17 ENCOUNTER — Other Ambulatory Visit (HOSPITAL_COMMUNITY): Payer: Self-pay

## 2022-06-17 ENCOUNTER — Encounter: Payer: Self-pay | Admitting: Internal Medicine

## 2022-06-17 ENCOUNTER — Other Ambulatory Visit: Payer: Self-pay | Admitting: Internal Medicine

## 2022-06-17 ENCOUNTER — Ambulatory Visit: Payer: BC Managed Care – PPO | Admitting: Internal Medicine

## 2022-06-17 VITALS — BP 136/70 | HR 68 | Temp 98.5°F | Ht 63.0 in | Wt >= 6400 oz

## 2022-06-17 DIAGNOSIS — E669 Obesity, unspecified: Secondary | ICD-10-CM | POA: Diagnosis not present

## 2022-06-17 DIAGNOSIS — I5032 Chronic diastolic (congestive) heart failure: Secondary | ICD-10-CM

## 2022-06-17 DIAGNOSIS — G8929 Other chronic pain: Secondary | ICD-10-CM

## 2022-06-17 DIAGNOSIS — H9193 Unspecified hearing loss, bilateral: Secondary | ICD-10-CM | POA: Insufficient documentation

## 2022-06-17 DIAGNOSIS — Z0001 Encounter for general adult medical examination with abnormal findings: Secondary | ICD-10-CM | POA: Diagnosis not present

## 2022-06-17 DIAGNOSIS — I10 Essential (primary) hypertension: Secondary | ICD-10-CM

## 2022-06-17 DIAGNOSIS — Z01419 Encounter for gynecological examination (general) (routine) without abnormal findings: Secondary | ICD-10-CM | POA: Insufficient documentation

## 2022-06-17 DIAGNOSIS — E039 Hypothyroidism, unspecified: Secondary | ICD-10-CM

## 2022-06-17 DIAGNOSIS — D649 Anemia, unspecified: Secondary | ICD-10-CM

## 2022-06-17 DIAGNOSIS — I110 Hypertensive heart disease with heart failure: Secondary | ICD-10-CM

## 2022-06-17 DIAGNOSIS — E1169 Type 2 diabetes mellitus with other specified complication: Secondary | ICD-10-CM | POA: Diagnosis not present

## 2022-06-17 DIAGNOSIS — E559 Vitamin D deficiency, unspecified: Secondary | ICD-10-CM

## 2022-06-17 DIAGNOSIS — L301 Dyshidrosis [pompholyx]: Secondary | ICD-10-CM

## 2022-06-17 DIAGNOSIS — E038 Other specified hypothyroidism: Secondary | ICD-10-CM

## 2022-06-17 DIAGNOSIS — Z6841 Body Mass Index (BMI) 40.0 and over, adult: Secondary | ICD-10-CM

## 2022-06-17 LAB — IBC PANEL
Iron: 34 ug/dL — ABNORMAL LOW (ref 42–145)
Saturation Ratios: 6.4 % — ABNORMAL LOW (ref 20.0–50.0)
TIBC: 527.8 ug/dL — ABNORMAL HIGH (ref 250.0–450.0)
Transferrin: 377 mg/dL — ABNORMAL HIGH (ref 212.0–360.0)

## 2022-06-17 LAB — LIPID PANEL
Cholesterol: 185 mg/dL (ref 0–200)
HDL: 53.7 mg/dL (ref >39.00)
LDL Cholesterol: 94 mg/dL (ref 0–99)
NonHDL: 131.49
Total CHOL/HDL Ratio: 3
Triglycerides: 187 mg/dL — ABNORMAL HIGH (ref 0.0–149.0)
VLDL: 37.4 mg/dL (ref 0.0–40.0)

## 2022-06-17 LAB — TSH: TSH: 4.38 u[IU]/mL (ref 0.35–5.50)

## 2022-06-17 LAB — CBC WITH DIFFERENTIAL/PLATELET
Basophils Absolute: 0 10*3/uL (ref 0.0–0.1)
Basophils Relative: 0.6 % (ref 0.0–3.0)
Eosinophils Absolute: 0.2 10*3/uL (ref 0.0–0.7)
Eosinophils Relative: 3.2 % (ref 0.0–5.0)
HCT: 35.3 % — ABNORMAL LOW (ref 36.0–46.0)
Hemoglobin: 11.1 g/dL — ABNORMAL LOW (ref 12.0–15.0)
Lymphocytes Relative: 21.9 % (ref 12.0–46.0)
Lymphs Abs: 1.7 10*3/uL (ref 0.7–4.0)
MCHC: 31.4 g/dL (ref 30.0–36.0)
MCV: 83.1 fl (ref 78.0–100.0)
Monocytes Absolute: 0.5 10*3/uL (ref 0.1–1.0)
Monocytes Relative: 6.8 % (ref 3.0–12.0)
Neutro Abs: 5.1 10*3/uL (ref 1.4–7.7)
Neutrophils Relative %: 67.5 % (ref 43.0–77.0)
Platelets: 258 10*3/uL (ref 150.0–400.0)
RBC: 4.25 Mil/uL (ref 3.87–5.11)
RDW: 17.7 % — ABNORMAL HIGH (ref 11.5–15.5)
WBC: 7.6 10*3/uL (ref 4.0–10.5)

## 2022-06-17 LAB — FERRITIN: Ferritin: 15.4 ng/mL (ref 10.0–291.0)

## 2022-06-17 MED ORDER — METOPROLOL SUCCINATE ER 50 MG PO TB24
50.0000 mg | ORAL_TABLET | Freq: Every day | ORAL | 3 refills | Status: DC
  Filled 2022-06-17: qty 90, fill #0
  Filled 2022-09-10: qty 90, 90d supply, fill #0
  Filled 2022-12-06 – 2022-12-20 (×2): qty 90, 90d supply, fill #1
  Filled 2023-03-17: qty 90, 90d supply, fill #2

## 2022-06-17 MED ORDER — SPIRONOLACTONE 25 MG PO TABS
25.0000 mg | ORAL_TABLET | Freq: Every day | ORAL | 3 refills | Status: DC
  Filled 2022-06-17: qty 90, 90d supply, fill #0
  Filled 2022-09-16: qty 90, 90d supply, fill #1
  Filled 2022-12-20: qty 90, 90d supply, fill #2
  Filled 2023-03-17: qty 90, 90d supply, fill #3

## 2022-06-17 MED ORDER — ROSUVASTATIN CALCIUM 10 MG PO TABS
10.0000 mg | ORAL_TABLET | Freq: Every day | ORAL | 3 refills | Status: DC
  Filled 2022-06-17: qty 90, fill #0
  Filled 2022-09-10: qty 90, 90d supply, fill #0
  Filled 2022-12-06 – 2022-12-20 (×2): qty 90, 90d supply, fill #1
  Filled 2023-03-17: qty 90, 90d supply, fill #2

## 2022-06-17 MED ORDER — POLYSACCHARIDE IRON COMPLEX 150 MG PO CAPS
150.0000 mg | ORAL_CAPSULE | Freq: Every day | ORAL | 1 refills | Status: DC
  Filled 2022-06-17: qty 90, 90d supply, fill #0
  Filled 2022-09-16: qty 90, 90d supply, fill #1

## 2022-06-17 MED ORDER — METFORMIN HCL 500 MG PO TABS
500.0000 mg | ORAL_TABLET | Freq: Every day | ORAL | 3 refills | Status: DC
  Filled 2022-06-17: qty 90, fill #0
  Filled 2022-09-10: qty 90, 90d supply, fill #0
  Filled 2022-12-06 – 2022-12-20 (×2): qty 90, 90d supply, fill #1
  Filled 2023-03-17: qty 90, 90d supply, fill #2

## 2022-06-17 MED ORDER — LEVOTHYROXINE SODIUM 150 MCG PO TABS
150.0000 ug | ORAL_TABLET | Freq: Every day | ORAL | 3 refills | Status: DC
  Filled 2022-06-17: qty 90, 90d supply, fill #0
  Filled 2022-09-16: qty 90, 90d supply, fill #1
  Filled 2022-12-20: qty 90, 90d supply, fill #2
  Filled 2023-03-17: qty 90, 90d supply, fill #3

## 2022-06-17 MED ORDER — TRIAMCINOLONE ACETONIDE 0.1 % EX CREA
1.0000 | TOPICAL_CREAM | Freq: Two times a day (BID) | CUTANEOUS | 3 refills | Status: DC
  Filled 2022-06-17: qty 30, 15d supply, fill #0
  Filled 2022-08-03: qty 30, 15d supply, fill #1
  Filled 2022-09-10: qty 30, 15d supply, fill #2
  Filled 2022-10-02: qty 30, 15d supply, fill #3

## 2022-06-17 MED ORDER — TRAMADOL HCL 50 MG PO TABS
50.0000 mg | ORAL_TABLET | Freq: Three times a day (TID) | ORAL | 0 refills | Status: DC | PRN
  Filled 2022-06-17: qty 21, 7d supply, fill #0

## 2022-06-17 MED ORDER — LOSARTAN POTASSIUM 100 MG PO TABS
100.0000 mg | ORAL_TABLET | Freq: Every day | ORAL | 3 refills | Status: DC
  Filled 2022-06-17: qty 90, 90d supply, fill #0

## 2022-06-17 NOTE — Patient Instructions (Signed)
Please consider asking about the higher dose ozempic next wk as you mentioned  Please continue all other medications as before, and refills have been done if requested.  Please have the pharmacy call with any other refills you may need.  Please continue your efforts at being more active, low cholesterol diet, and weight control.  You are otherwise up to date with prevention measures today.  Please keep your appointments with your specialists as you may have planned  You will be contacted regarding the referral for: Audiology, and GYN - Dr Radene Knee  Please go to the LAB at the blood drawing area for the tests to be done  You will be contacted by phone if any changes need to be made immediately.  Otherwise, you will receive a letter about your results with an explanation, but please check with MyChart first.  Please remember to sign up for MyChart if you have not done so, as this will be important to you in the future with finding out test results, communicating by private email, and scheduling acute appointments online when needed.  Please make an Appointment to return for your 1 year visit, or sooner if needed

## 2022-06-17 NOTE — Assessment & Plan Note (Signed)
Last vitamin D Lab Results  Component Value Date   VD25OH 30.2 11/11/2020   Low, to start oral replacement

## 2022-06-17 NOTE — Assessment & Plan Note (Signed)
Pt to consider asking for increased ozempic at next appt next wk - to the 2 mg weekly

## 2022-06-17 NOTE — Progress Notes (Unsigned)
Patient ID: Alexandra Beck, female   DOB: 1981/04/04, 41 y.o.   MRN: [id number]         Chief Complaint:: wellness exam and anemia, bilateral hearing loss, low thyroid       HPI:  Alexandra Beck is a 41 y.o. female here for wellness exam; plans to have yearly eye exam soon.  Needs GYn referral, o/w up to date                        Also did have recent hospn with CAP and acute resp failure, now weaned off home o2 after 1 wk, finished antbx, and followed closely by pulm.  Pt denies chest pain, increased sob or doe, wheezing, orthopnea, PND, increased LE swelling, palpitations, dizziness or syncope.   Pt denies polydipsia, polyuria, or new focal neuro s/s.    Pt denies fever, wt loss, night sweats, loss of appetite, or other constitutional symptoms  Has bilateral hearing loss not related to wax, needs audiology referral.  Has had no overt bleeding or bruising.  Denies hyper or hypo thyroid symptoms such as voice, skin or hair change.  Has rash to hands worsening with dryness and mild discomfort   Wt Readings from Last 3 Encounters:  06/17/22 (!) 444 lb (201.4 kg)  06/03/22 (!) 439 lb 12.8 oz (199.5 kg)  05/30/22 (!) 435 lb (197.3 kg)   BP Readings from Last 3 Encounters:  06/17/22 136/70  06/03/22 98/68  05/30/22 115/70   Immunization History  Administered Date(s) Administered   Influenza,inj,Quad PF,6+ Mos 08/02/2013, 08/28/2016, 10/11/2017, 07/23/2019, 10/27/2021   PFIZER(Purple Top)SARS-COV-2 Vaccination 02/06/2020, 02/27/2020   Pneumococcal Polysaccharide-23 12/28/2012   Tdap 08/26/2015   Health Maintenance Due  Topic Date Due   PAP SMEAR-Modifier  Never done   Diabetic kidney evaluation - Urine ACR  04/18/2019   OPHTHALMOLOGY EXAM  09/02/2019      Past Medical History:  Diagnosis Date   Acute renal failure (ARF) ([place]) 11/2012    multifactorial-likely secondary to ATN in the setting of sepsis and hypotension, and also from rhabdomyolysis   Anemia    Cellulitis    Chronic  diastolic CHF (congestive heart failure) ([place])    Depression    Diabetes mellitus (HCC)    GERD (gastroesophageal reflux disease)    Hyperlipidemia    Hypertension    Hypothyroidism    Morbid obesity with BMI of 70 and over, adult ([place])    Obesity hypoventilation syndrome (HCC)    OSA (obstructive sleep apnea)    PVC's (premature ventricular contractions)    Recurrent cellulitis of lower leg    LLE, venous insuff   Sepsis ([place]) 11/2012   Secondary to cellulitis   Sleep apnea    Venous insufficiency of leg    Past Surgical History:  Procedure Laterality Date   IR FLUORO GUIDE CV MIDLINE PICC RIGHT  03/28/2017   IR US GUIDE VASC ACCESS RIGHT  03/28/2017   WISDOM TOOTH EXTRACTION      reports that she quit smoking about 12 years ago. Her smoking use included cigarettes. She has a 4.00 pack-year smoking history. She has never used smokeless tobacco. She reports that she does not drink alcohol and does not use drugs. family history includes Diabetes in her mother; Heart disease in her father; High Cholesterol in her father and mother; Hypertension in her father and mother; Obesity in her father and mother; Sleep apnea in her father and mother; Thyroid disease in  her mother. Allergies  Allergen Reactions   Aspirin Hives and Swelling    REACTION: throat swelling, hives Other reaction(s): Unknown Other reaction(s): Unknown   Doxycycline Other (See Comments)    Abdominal pain, difficulty swallowing   Lisinopril Cough   Niacin And Related     Other reaction(s): Unknown Other reaction(s): Unknown   Niaspan [Niacin Er]     Caused flushing   Current Outpatient Medications on File Prior to Visit  Medication Sig Dispense Refill   acetaminophen (TYLENOL) 500 MG tablet Take 1,000-2,000 mg by mouth every 6 (six) hours as needed for mild pain.     Brexpiprazole (REXULTI) 0.5 MG TABS Take 1 tablet by mouth daily. 30 tablet 2   ibuprofen (ADVIL) 200 MG tablet Take 800 mg by mouth every 6 (six)  hours as needed for headache or moderate pain.     modafinil (PROVIGIL) 100 MG tablet Take '100mg'$  tablet by mouth daily at 9am along with 200 mg tablet daily at 9am (total '300mg'$ ) 60 tablet 3   modafinil (PROVIGIL) 200 MG tablet Take 1 tablet by mouth daily at 9am (along with 100 mg tablet daily at 9am (total '300mg'$ )) 60 tablet 3   Semaglutide, 1 MG/DOSE, 4 MG/3ML SOPN Inject 1 mg as directed once a week. 3 mL 0   sertraline (ZOLOFT) 100 MG tablet Take 1 tablet by mouth 2 times daily. (Patient taking differently: Take 200 mg by mouth daily.) 180 tablet 3   torsemide (DEMADEX) 20 MG tablet Take 3 tablets (60 mg total) by mouth in the morning AND 2 tablets (40 mg total) every evening as needed (Patient taking differently: Take 3 tablets (60 mg total) by mouth in the morning AND 2 tablets (40 mg total) every evening as needed for fluid) 300 tablet 0   No current facility-administered medications on file prior to visit.        ROS:  All others reviewed and negative.  Objective        PE:  BP 136/70 (BP Location: Right Arm, Patient Position: Sitting, Cuff Size: Large)   Pulse 68   Temp 98.5 F (36.9 C) (Oral)   Ht '5\' 3"'$  (1.6 m)   Wt (!) 444 lb (201.4 kg)   SpO2 96%   BMI 78.65 kg/m                 Constitutional: Pt appears in NAD               HENT: Head: NCAT.                Right Ear: External ear normal.                 Left Ear: External ear normal.                Eyes: . Pupils are equal, round, and reactive to light. Conjunctivae and EOM are normal               Nose: without d/c or deformity               Neck: Neck supple. Gross normal ROM               Cardiovascular: Normal rate and regular rhythm.                 Pulmonary/Chest: Effort normal and breath sounds without rales or wheezing.  Abd:  Soft, NT, ND, + BS, no organomegaly               Neurological: Pt is alert. At baseline orientation, motor grossly intact               Skin: Skin is warm. No rashes except  bilateral hand eczema, no other new lesions, LE edema - none               Psychiatric: Pt behavior is normal without agitation   Micro: none  Cardiac tracings I have personally interpreted today:  none  Pertinent Radiological findings (summarize): none   Lab Results  Component Value Date   WBC 7.6 06/17/2022   HGB 11.1 (L) 06/17/2022   HCT 35.3 (L) 06/17/2022   PLT 258.0 06/17/2022   GLUCOSE 113 (H) 05/17/2022   CHOL 185 06/17/2022   TRIG 187.0 (H) 06/17/2022   HDL 53.70 06/17/2022   LDLDIRECT 141.0 03/02/2016   LDLCALC 94 06/17/2022   ALT 34 05/17/2022   AST 31 05/17/2022   NA 140 05/17/2022   K 3.5 05/17/2022   CL 100 05/17/2022   CREATININE 1.16 (H) 05/17/2022   BUN 18 05/17/2022   CO2 29 05/17/2022   TSH 4.38 06/17/2022   INR 1.03 03/25/2017   HGBA1C 5.8 (H) 05/13/2022   MICROALBUR 8.05 (H) 06/26/2007   Assessment/Plan:  ARIEAL CUOCO is a 41 y.o. White or Caucasian [1] female with  has a past medical history of Acute renal failure (ARF) ([place]) (11/2012), Anemia, Cellulitis, Chronic diastolic CHF (congestive heart failure) ([place]), Depression, Diabetes mellitus ([place]), GERD (gastroesophageal reflux disease), Hyperlipidemia, Hypertension, Hypothyroidism, Morbid obesity with BMI of 70 and over, adult ([place]), Obesity hypoventilation syndrome ([place]), OSA (obstructive sleep apnea), PVC's (premature ventricular contractions), Recurrent cellulitis of lower leg, Sepsis ([place]) (11/2012), Sleep apnea, and Venous insufficiency of leg.  Obesity, morbid, BMI 50 or higher (HCC) Pt to consider asking for increased ozempic at next appt next wk - to the 2 mg weekly  Vitamin D deficiency Last vitamin D Lab Results  Component Value Date   VD25OH 30.2 11/11/2020   Low, to start oral replacement   Encounter for well adult exam with abnormal findings Age and sex appropriate education and counseling updated with regular exercise and diet Referrals for preventative services - for optho  referral, and GYN referral Immunizations addressed - none needed Smoking counseling  - none needed Evidence for depression or other mood disorder - none significant Most recent labs reviewed. I have personally reviewed and have noted: 1) the patient's medical and social history 2) The patient's current medications and supplements 3) The patient's height, weight, and BMI have been recorded in the chart   Hypothyroidism Lab Results  Component Value Date   TSH 4.38 06/17/2022   Stable, pt to continue levothyroxine 150 mcg qd   Essential hypertension BP Readings from Last 3 Encounters:  06/17/22 136/70  06/03/22 98/68  05/30/22 115/70   Stable, pt to continue medical treatment losartan 100 mg qd, toprol xl 50 qd   Eczema, dyshidrotic Mild worsening, for triam cr prn  Diabetes mellitus type 2 in obese [facility]) Lab Results  Component Value Date   HGBA1C 5.8 (H) 05/13/2022   Stable, pt to continue current medical treatment metformin 50 0qd   Well woman exam Also for GYN referral  Chronic diastolic CHF (congestive heart failure) ([place]) Stable, cont aldactone  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  Cathlean Cower, MD 06/19/2022 2:56 PM [facility]

## 2022-06-18 ENCOUNTER — Other Ambulatory Visit (INDEPENDENT_AMBULATORY_CARE_PROVIDER_SITE_OTHER): Payer: Self-pay | Admitting: Family Medicine

## 2022-06-18 ENCOUNTER — Other Ambulatory Visit (HOSPITAL_COMMUNITY): Payer: Self-pay

## 2022-06-18 ENCOUNTER — Other Ambulatory Visit: Payer: Self-pay | Admitting: Adult Health

## 2022-06-18 DIAGNOSIS — I5032 Chronic diastolic (congestive) heart failure: Secondary | ICD-10-CM

## 2022-06-18 DIAGNOSIS — F331 Major depressive disorder, recurrent, moderate: Secondary | ICD-10-CM

## 2022-06-19 ENCOUNTER — Encounter: Payer: Self-pay | Admitting: Internal Medicine

## 2022-06-19 NOTE — Assessment & Plan Note (Signed)
Also for GYN referral

## 2022-06-19 NOTE — Assessment & Plan Note (Signed)
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

## 2022-06-19 NOTE — Assessment & Plan Note (Signed)
Age and sex appropriate education and counseling updated with regular exercise and diet Referrals for preventative services - for optho referral, and GYN referral Immunizations addressed - none needed Smoking counseling  - none needed Evidence for depression or other mood disorder - none significant Most recent labs reviewed. I have personally reviewed and have noted: 1) the patient's medical and social history 2) The patient's current medications and supplements 3) The patient's height, weight, and BMI have been recorded in the chart

## 2022-06-19 NOTE — Assessment & Plan Note (Signed)
Lab Results  Component Value Date   WBC 7.6 06/17/2022   HGB 11.1 (L) 06/17/2022   HCT 35.3 (L) 06/17/2022   MCV 83.1 06/17/2022   PLT 258.0 06/17/2022  mild persistent, for f/u iron labs

## 2022-06-19 NOTE — Assessment & Plan Note (Signed)
Mild worsening, for triam cr prn

## 2022-06-19 NOTE — Assessment & Plan Note (Signed)
Lab Results  Component Value Date   HGBA1C 5.8 (H) 05/13/2022   Stable, pt to continue current medical treatment metformin 50 0qd

## 2022-06-19 NOTE — Assessment & Plan Note (Signed)
Stable, cont aldactone

## 2022-06-19 NOTE — Assessment & Plan Note (Signed)
BP Readings from Last 3 Encounters:  06/17/22 136/70  06/03/22 98/68  05/30/22 115/70   Stable, pt to continue medical treatment losartan 100 mg qd, toprol xl 50 qd

## 2022-06-19 NOTE — Assessment & Plan Note (Signed)
Lab Results  Component Value Date   TSH 4.38 06/17/2022   Stable, pt to continue levothyroxine 150 mcg qd

## 2022-06-20 ENCOUNTER — Other Ambulatory Visit (HOSPITAL_COMMUNITY): Payer: Self-pay

## 2022-06-20 ENCOUNTER — Telehealth (INDEPENDENT_AMBULATORY_CARE_PROVIDER_SITE_OTHER): Payer: BC Managed Care – PPO | Admitting: Family Medicine

## 2022-06-20 ENCOUNTER — Other Ambulatory Visit (INDEPENDENT_AMBULATORY_CARE_PROVIDER_SITE_OTHER): Payer: Self-pay | Admitting: Interventional Cardiology

## 2022-06-20 ENCOUNTER — Encounter (INDEPENDENT_AMBULATORY_CARE_PROVIDER_SITE_OTHER): Payer: Self-pay | Admitting: Family Medicine

## 2022-06-20 DIAGNOSIS — I5032 Chronic diastolic (congestive) heart failure: Secondary | ICD-10-CM

## 2022-06-20 DIAGNOSIS — Z7984 Long term (current) use of oral hypoglycemic drugs: Secondary | ICD-10-CM

## 2022-06-20 DIAGNOSIS — E669 Obesity, unspecified: Secondary | ICD-10-CM | POA: Diagnosis not present

## 2022-06-20 DIAGNOSIS — E1169 Type 2 diabetes mellitus with other specified complication: Secondary | ICD-10-CM

## 2022-06-20 DIAGNOSIS — F3289 Other specified depressive episodes: Secondary | ICD-10-CM

## 2022-06-20 DIAGNOSIS — Z7985 Long-term (current) use of injectable non-insulin antidiabetic drugs: Secondary | ICD-10-CM

## 2022-06-20 DIAGNOSIS — Z6841 Body Mass Index (BMI) 40.0 and over, adult: Secondary | ICD-10-CM

## 2022-06-20 DIAGNOSIS — Z87891 Personal history of nicotine dependence: Secondary | ICD-10-CM

## 2022-06-20 MED ORDER — TORSEMIDE 20 MG PO TABS
80.0000 mg | ORAL_TABLET | Freq: Every morning | ORAL | 1 refills | Status: DC
  Filled 2022-06-20: qty 120, 30d supply, fill #0
  Filled 2022-07-16 – 2022-07-25 (×2): qty 120, 30d supply, fill #1

## 2022-06-20 MED ORDER — SEMAGLUTIDE (2 MG/DOSE) 8 MG/3ML ~~LOC~~ SOPN
2.0000 mg | PEN_INJECTOR | SUBCUTANEOUS | 0 refills | Status: DC
  Filled 2022-06-20 – 2022-06-24 (×2): qty 3, 28d supply, fill #0

## 2022-06-20 NOTE — Telephone Encounter (Signed)
Please schedule appt

## 2022-06-21 ENCOUNTER — Other Ambulatory Visit (HOSPITAL_COMMUNITY): Payer: Self-pay

## 2022-06-22 ENCOUNTER — Ambulatory Visit: Payer: BC Managed Care – PPO | Admitting: Psychology

## 2022-06-22 ENCOUNTER — Ambulatory Visit (INDEPENDENT_AMBULATORY_CARE_PROVIDER_SITE_OTHER): Payer: BC Managed Care – PPO | Admitting: Psychology

## 2022-06-22 DIAGNOSIS — F331 Major depressive disorder, recurrent, moderate: Secondary | ICD-10-CM | POA: Diagnosis not present

## 2022-06-22 NOTE — Progress Notes (Signed)
[facility] Counselor/Therapist Progress Note  Patient ID: SYESHA THAW, MRN: [id number],    Date: 06/22/2022  Time Spent: 60 minutes  Treatment Type: Individual Therapy  Reported Symptoms: sadness,   Mental Status Exam: Appearance:  Casual     Behavior: Appropriate  Motor: Normal  Speech/Language:  Normal Rate  Affect: Appropriate  Mood: Pleasant  Thought process: normal  Thought content:   WNL  Sensory/Perceptual disturbances:   WNL  Orientation: oriented to person, place, time/date, and situation  Attention: Good  Concentration: Good  Memory: WNL  Fund of knowledge:  Good  Insight:   Good  Judgment:  Good  Impulse Control: Good   Risk Assessment: Danger to Self:  No Self-injurious Behavior: No Danger to Others: No Duty to Warn:no Physical Aggression / Violence:No  Access to Firearms a concern: No  Gang Involvement:No   Subjective: The patient attended a face-to-face individual therapy session today via video visit .  The patient gave verbal consent for the video visit to be on WebEx.  The patient was in her home alone and the therapist was in the office.   The patient presents as pleasant and cooperative.  The patient reports that she has been doing well over the last couple weeks.  The patient talked about an issue that she had with her partner around a situation where she stayed up later playing her game.  We talked about a different way to have handled the situation and talked about the importance of clear communication.  The patient is doing  much better with her depression and also she is now showering twice a week.  We talked about some other ways to view the situation with her partner and the patient is doing much better at not accepting when all of the responsibility is being put on her for problems in the relationship.  Interventions: Cognitive Behavioral Therapy, Assertiveness/Communication, and Problem solving  Diagnosis:Major depressive  disorder, recurrent episode, moderate (HCC)  Plan: Treatment Plan  Strengths/Abilities:  Intelligent, ability for insight, supportive wife  Treatment Preferences:  Outpatient Individual therapy  Statement of Needs:  "I Need help with my depression and motivation. "  Symptoms:Depressed or irritable mood.:(Status: improved). Diminished interest in or  enjoyment of activities.(Status: improved). Feelings of hopelessness,  worthlessness, or inappropriate guilt.: (Status: improved). History of chronic or recurrent depression for which the client has taken antidepressant medication, been hospitalized, had  outpatient treatment, or had a course of electroconvulsive therapy.(Status:  maintained). Lack of energy.: (Status: maintained). Low self-esteem.:  (Status: maintained). Poor concentration and indecisiveness.:  (Status: improved). Social withdrawal.:  (Status: maintained).  Unresolved grief issues.: (Status: maintained).  Problems Addressed:  Unipolar Depression  Goals:  LTG:1. Alleviate depressive symptoms and return to previous level of effective  functioning.  60% 2. Appropriately grieve the loss in order to normalize mood and to return  to previously adaptive level of functioning. 3. Develop healthy interpersonal relationships that lead to the alleviation  and help prevent the relapse of depression.  60% 4. Develop healthy thinking patterns and beliefs about self, others, and the world that lead to the alleviation and help prevent the relapse of  Depression  60% STG:1.Describe current and past experiences with depression including their impact on functioning and  attempts to resolve it.  60 % 2. Identify and replace thoughts and beliefs that support depression.  50 % 3.Learn and implement behavioral strategies to overcome depression. 50% 4.Verbalize an understanding of healthy and unhealthy emotions with the intent of  increasing the use of  healthy emotions to guide actions.  30  %  Target Date:  10/07/2022 Frequency:  Weekly Modality:Individual Interventions by Therapist:  CBT, problem solving, EMDR, insight oriented  Patient approved Treatment Plan  Norrin Shreffler G Shaili Donalson, LCSW                            Bartholomew Ramesh G Carianne Taira, LCSW               Amery Vandenbos G Michelangelo Rindfleisch, LCSW               Shirah Roseman [employer], LCSW               Plato Alspaugh [employer], LCSW               Hector Venne [employer], LCSW               Davanee Klinkner [employer], LCSW               Anyae Griffith [employer], LCSW               Elisse Pennick [employer], LCSW               Pratt Bress [employer], LCSW               Nicola Heinemann [employer], LCSW               Suzi Hernan [employer], LCSW               Marene Gilliam [employer], LCSW               Zissel Biederman [employer], LCSW               Maretta Overdorf [employer], LCSW               Yolette Hastings [employer], LCSW               Aimar Borghi [employer], LCSW               Teryl Gubler [employer], LCSW               Shauni Henner [employer], LCSW               Radford Pease [employer], LCSW               Hakiem Malizia G [place], LCSW               Drinda Belgard G [place], LCSW               Marquitta Persichetti G Nuala Chiles, LCSW               Chrystina Naff [place], LCSW

## 2022-06-24 ENCOUNTER — Other Ambulatory Visit (HOSPITAL_COMMUNITY): Payer: Self-pay

## 2022-06-24 NOTE — Telephone Encounter (Signed)
Pt scheduled mychart video for 8/11.  Pls let me know if she needs to come in the office.

## 2022-06-27 ENCOUNTER — Other Ambulatory Visit (HOSPITAL_COMMUNITY): Payer: Self-pay

## 2022-06-27 MED ORDER — REXULTI 0.5 MG PO TABS
0.5000 mg | ORAL_TABLET | Freq: Every day | ORAL | 2 refills | Status: DC
  Filled 2022-06-27: qty 30, 30d supply, fill #0
  Filled 2022-07-16 – 2022-07-25 (×2): qty 30, 30d supply, fill #1
  Filled 2022-08-14: qty 30, 30d supply, fill #2

## 2022-06-28 ENCOUNTER — Other Ambulatory Visit (HOSPITAL_COMMUNITY): Payer: Self-pay

## 2022-06-29 ENCOUNTER — Ambulatory Visit: Payer: BC Managed Care – PPO | Admitting: Psychology

## 2022-07-06 ENCOUNTER — Ambulatory Visit: Payer: BC Managed Care – PPO | Admitting: Psychology

## 2022-07-06 ENCOUNTER — Encounter (INDEPENDENT_AMBULATORY_CARE_PROVIDER_SITE_OTHER): Payer: Self-pay

## 2022-07-06 ENCOUNTER — Ambulatory Visit (INDEPENDENT_AMBULATORY_CARE_PROVIDER_SITE_OTHER): Payer: BC Managed Care – PPO | Admitting: Psychology

## 2022-07-06 ENCOUNTER — Other Ambulatory Visit (HOSPITAL_COMMUNITY): Payer: Self-pay

## 2022-07-06 DIAGNOSIS — F331 Major depressive disorder, recurrent, moderate: Secondary | ICD-10-CM

## 2022-07-06 NOTE — Progress Notes (Signed)
[facility] Counselor/Therapist Progress Note  Patient ID: YSELA HETTINGER, MRN: [id number],    Date: 07/06/2022  Time Spent: 60 minutes  Treatment Type: Individual Therapy  Reported Symptoms: sadness,   Mental Status Exam: Appearance:  Casual     Behavior: Appropriate  Motor: Normal  Speech/Language:  Normal Rate  Affect: Appropriate  Mood: Pleasant  Thought process: normal  Thought content:   WNL  Sensory/Perceptual disturbances:   WNL  Orientation: oriented to person, place, time/date, and situation  Attention: Good  Concentration: Good  Memory: WNL  Fund of knowledge:  Good  Insight:   Good  Judgment:  Good  Impulse Control: Good   Risk Assessment: Danger to Self:  No Self-injurious Behavior: No Danger to Others: No Duty to Warn:no Physical Aggression / Violence:No  Access to Firearms a concern: No  Gang Involvement:No   Subjective: The patient attended a face-to-face individual therapy session today via video visit .  The patient gave verbal consent for the video visit to be on WebEx.  The patient was in her home alone and the therapist was in the office.   The patient presents as pleasant and cooperative.  The patient reports that she has had some medication issues over the last few weeks being unable to get her Rexulti.  She now has it and she reports that she is feeling better now.  She is able to see that the medication has done a lot for her and she states that she did have some periods of melancholia.  We processed one of the interactions that she had with her wife and also with her streaming group.  The patient seems to be handling things very well and I gave her positive feedback for handling both situations in a very good way.  The patient is doing better at not contributing to her wife's insecurity however her wife is still being somewhat reactive out of her own insecurities.  We talked about how to continue to handle situations that  arise. Interventions: Cognitive Behavioral Therapy, Assertiveness/Communication, and Problem solving  Diagnosis:Major depressive disorder, recurrent episode, moderate (HCC)  Plan: Treatment Plan  Strengths/Abilities:  Intelligent, ability for insight, supportive wife  Treatment Preferences:  Outpatient Individual therapy  Statement of Needs:  "I Need help with my depression and motivation. "  Symptoms:Depressed or irritable mood.:(Status: improved). Diminished interest in or  enjoyment of activities.(Status: improved). Feelings of hopelessness,  worthlessness, or inappropriate guilt.: (Status: improved). History of chronic or recurrent depression for which the client has taken antidepressant medication, been hospitalized, had  outpatient treatment, or had a course of electroconvulsive therapy.(Status:  maintained). Lack of energy.: (Status: maintained). Low self-esteem.:  (Status: improved). Poor concentration and indecisiveness.:  (Status: improved). Social withdrawal.:  (Status: maintained).  Unresolved grief issues.: (Status: maintained).  Problems Addressed:  Unipolar Depression  Goals:  LTG:1. Alleviate depressive symptoms and return to previous level of effective  functioning.  60% 2. Appropriately grieve the loss in order to normalize mood and to return  to previously adaptive level of functioning. 3. Develop healthy interpersonal relationships that lead to the alleviation  and help prevent the relapse of depression.  60% 4. Develop healthy thinking patterns and beliefs about self, others, and the world that lead to the alleviation and help prevent the relapse of  Depression  60% STG:1.Describe current and past experiences with depression including their impact on functioning and  attempts to resolve it.  60 % 2. Identify and replace thoughts and beliefs that support  depression.  50 % 3.Learn and implement behavioral strategies to overcome depression. 50% 4.Verbalize an  understanding of healthy and unhealthy emotions with the intent of increasing the use of  healthy emotions to guide actions.  30 %  Target Date:  10/07/2022 Frequency:  Weekly Modality:Individual Interventions by Therapist:  CBT, problem solving, EMDR, insight oriented  Patient approved Treatment Plan  Jencarlo Bonadonna G Muneer Leider, LCSW                            Demaree Liberto G Shraga Custard, LCSW               Elektra Wartman G Meliah Appleman, LCSW               Marliyah Reid [employer], LCSW               Kaylie Ritter [employer], LCSW               Kaydan Wilhoite [employer], LCSW               Glendale Wherry [employer], LCSW               Brenee Gajda [employer], LCSW               Mishell Donalson [employer], LCSW               Dagon Budai [employer], LCSW               Shams Fill [employer], LCSW               Vedika Dumlao [employer], LCSW               Shoichi Mielke [employer], LCSW               Chenise Mulvihill [employer], LCSW               Anael Rosch [employer], LCSW               Elva Breaker [employer], LCSW               Ronetta Molla [employer], LCSW               Emmitt Matthews [employer], LCSW               Sevan Mcbroom [employer], LCSW               Jakaylee Sasaki [employer], LCSW               Jlyn Cerros G [place], LCSW               Lorriann Hansmann G [place], LCSW               Delano Frate G [place], LCSW               Sael Furches G Cherrish Vitali, LCSW               Hatcher Froning [place], LCSW

## 2022-07-08 ENCOUNTER — Encounter: Payer: Self-pay | Admitting: Adult Health

## 2022-07-08 ENCOUNTER — Encounter: Payer: Self-pay | Admitting: Internal Medicine

## 2022-07-08 ENCOUNTER — Telehealth (INDEPENDENT_AMBULATORY_CARE_PROVIDER_SITE_OTHER): Payer: BC Managed Care – PPO | Admitting: Adult Health

## 2022-07-08 DIAGNOSIS — G4700 Insomnia, unspecified: Secondary | ICD-10-CM

## 2022-07-08 DIAGNOSIS — F331 Major depressive disorder, recurrent, moderate: Secondary | ICD-10-CM | POA: Diagnosis not present

## 2022-07-08 DIAGNOSIS — F428 Other obsessive-compulsive disorder: Secondary | ICD-10-CM

## 2022-07-08 LAB — HM DIABETES EYE EXAM

## 2022-07-08 NOTE — Progress Notes (Signed)
Alexandra Beck [id number] 05-03-1981 41 y.o.  Virtual Visit via Video Note  I connected with pt @ on 07/08/22 at  2:40 PM EDT by a video enabled telemedicine application and verified that I am speaking with the correct person using two identifiers.   I discussed the limitations of evaluation and management by telemedicine and the availability of in person appointments. The patient expressed understanding and agreed to proceed.  I discussed the assessment and treatment plan with the patient. The patient was provided an opportunity to ask questions and all were answered. The patient agreed with the plan and demonstrated an understanding of the instructions.   The patient was advised to call back or seek an in-person evaluation if the symptoms worsen or if the condition fails to improve as anticipated.  I provided 25 minutes of non-face-to-face time during this encounter.  The patient was located at home.  The provider was located at [place].   Aloha Gell, NP   Subjective:   Patient ID:  Alexandra Beck is a 41 y.o. (DOB [birth year]) female.  Chief Complaint: No chief complaint on file.   HPI ANJELI CASAD presents for follow-up of insomnia, obsessional thoughts and depression.  Describes mood today as "better". Pleasant. Decreased tearfulness. Mood symptoms - reports decreased depression and irritability. Denies anxiety. Decreased worry and rumination. Denies obsessive thoughts or acts. Mood is consistent. Stating "I'm doing alright". Feels like the addition of Rexulti has been helpful. Seeing therapist - Bambi Cottle for therapy. Varying interest and motivation. Taking medications as prescribed. Energy levels improved. Active, does not have a regular exercise routine.  Enjoys some usual interests and activities. Married. Lives with wife and 2 cats. Spending time with family. Gaming online with friends. Appetite adequate not much of an appetite. Taking Ozempic. Weight loss.  Working with a weight management clinic.  Sleeping well most nights. Averages 7 to 8 hours. Focus and concentration stable. Completing tasks. Managing minimal aspects of household. Works 12 to 9 - at [employer].  Denies SI or HI.  Denies AH or VH. Seeing Bambi Cottle for therapy.  Previous medication trials: Trialed on Wellbutrin at age 73, Trintellix-weird dreams, [place]  Review of Systems:  Review of Systems  Musculoskeletal:  Negative for gait problem.  Neurological:  Negative for tremors.  Psychiatric/Behavioral:         Please refer to HPI    Medications: I have reviewed the patient's current medications.  Current Outpatient Medications  Medication Sig Dispense Refill   acetaminophen (TYLENOL) 500 MG tablet Take 1,000-2,000 mg by mouth every 6 (six) hours as needed for mild pain.     Brexpiprazole (REXULTI) 0.5 MG TABS Take 1 tablet by mouth daily. 30 tablet 2   ibuprofen (ADVIL) 200 MG tablet Take 800 mg by mouth every 6 (six) hours as needed for headache or moderate pain.     iron polysaccharides (NU-IRON) 150 MG capsule Take 1 capsule (150 mg total) by mouth daily. 90 capsule 1   levothyroxine (SYNTHROID) 150 MCG tablet Take 1 tablet (150 mcg total) by mouth daily before breakfast. 90 tablet 3   losartan (COZAAR) 100 MG tablet Take 1 tablet (100 mg total) by mouth daily. 90 tablet 3   metFORMIN (GLUCOPHAGE) 500 MG tablet TAKE 1 TABLET (500 MG) BY MOUTH DAILY 90 tablet 3   metoprolol succinate (TOPROL-XL) 50 MG 24 hr tablet TAKE 1 TABLET BY MOUTH DAILY 90 tablet 3   modafinil (PROVIGIL) 100 MG tablet  Take '100mg'$  tablet by mouth daily at 9am along with 200 mg tablet daily at 9am (total '300mg'$ ) 60 tablet 3   modafinil (PROVIGIL) 200 MG tablet Take 1 tablet by mouth daily at 9am (along with 100 mg tablet daily at 9am (total '300mg'$ )) 60 tablet 3   rosuvastatin (CRESTOR) 10 MG tablet TAKE 1 TABLET (10 MG) BY MOUTH DAILY 90 tablet 3   Semaglutide, 2 MG/DOSE, 8 MG/3ML SOPN Inject 2  mg as directed once a week. 3 mL 0   sertraline (ZOLOFT) 100 MG tablet Take 1 tablet by mouth 2 times daily. (Patient taking differently: Take 200 mg by mouth daily.) 180 tablet 3   spironolactone (ALDACTONE) 25 MG tablet Take 1 tablet (25 mg total) by mouth daily. 90 tablet 3   torsemide (DEMADEX) 20 MG tablet Take 4 tablets by mouth every morning. 120 tablet 1   traMADol (ULTRAM) 50 MG tablet Take 1 tablet (50 mg total) by mouth every 8 (eight) hours as needed. 40 tablet 0   triamcinolone cream (KENALOG) 0.1 % Apply 1 Application topically 2 (two) times daily. 30 g 3   No current facility-administered medications for this visit.    Medication Side Effects: None  Allergies:  Allergies  Allergen Reactions   Aspirin Hives and Swelling    REACTION: throat swelling, hives Other reaction(s): Unknown Other reaction(s): Unknown   Doxycycline Other (See Comments)    Abdominal pain, difficulty swallowing   Lisinopril Cough   Niacin And Related     Other reaction(s): Unknown Other reaction(s): Unknown   Niaspan [Niacin Er]     Caused flushing    Past Medical History:  Diagnosis Date   Acute renal failure (ARF) ([place]) 11/2012    multifactorial-likely secondary to ATN in the setting of sepsis and hypotension, and also from rhabdomyolysis   Anemia    Cellulitis    Chronic diastolic CHF (congestive heart failure) (HCC)    Depression    Diabetes mellitus (HCC)    GERD (gastroesophageal reflux disease)    Hyperlipidemia    Hypertension    Hypothyroidism    Morbid obesity with BMI of 70 and over, adult ([place])    Obesity hypoventilation syndrome (HCC)    OSA (obstructive sleep apnea)    PVC's (premature ventricular contractions)    Recurrent cellulitis of lower leg    LLE, venous insuff   Sepsis ([place]) 11/2012   Secondary to cellulitis   Sleep apnea    Venous insufficiency of leg     Family History  Problem Relation Age of Onset   Hypertension Mother    Diabetes Mother    High  Cholesterol Mother    Thyroid disease Mother    Sleep apnea Mother    Obesity Mother    Hypertension Father    Heart disease Father        before age 18   High Cholesterol Father    Sleep apnea Father    Obesity Father     Social History   Socioeconomic History   Marital status: Single    Spouse name: Not on file   Number of children: 0   Years of education: Not on file   Highest education level: Not on file  Occupational History   Occupation: Librarian    Employer: [employer]  Tobacco Use   Smoking status: Former    Packs/day: 0.50    Years: 8.00    Total pack years: 4.00    Types: Cigarettes  Quit date: 2011    Years since quitting: 12.6   Smokeless tobacco: Never  Vaping Use   Vaping Use: Never used  Substance and Sexual Activity   Alcohol use: No    Alcohol/week: 0.0 standard drinks of alcohol   Drug use: No   Sexual activity: Not on file  Other Topics Concern   Not on file  Social History Narrative   Librarian, 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   Please see review of systems for further details on the patient's review from today.   Objective:   Physical Exam:  There were no vitals taken for this visit.  Physical Exam Constitutional:      General: She is not in acute distress. Musculoskeletal:        General: No deformity.  Neurological:     Mental Status: She is alert and oriented to person, place, and time.     Coordination: Coordination normal.  Psychiatric:        Attention and Perception: Attention and perception normal. She does not perceive  auditory or visual hallucinations.        Mood and Affect: Mood normal. Mood is not anxious or depressed. Affect is not labile, blunt, angry or inappropriate.        Speech: Speech normal.        Behavior: Behavior normal.        Thought Content: Thought content normal. Thought content is not paranoid or delusional. Thought content does not include homicidal or suicidal ideation. Thought content does not include homicidal or suicidal plan.        Cognition and Memory: Cognition and memory normal.        Judgment: Judgment normal.     Comments: Insight intact     Lab Review:     Component Value Date/Time   NA 140 05/17/2022 0351   NA 141 09/14/2021 1331   K 3.5 05/17/2022 0351   CL 100 05/17/2022 0351   CO2 29 05/17/2022 0351   GLUCOSE 113 (H) 05/17/2022 0351   BUN 18 05/17/2022 0351   BUN 26 (H) 09/14/2021 1331   CREATININE 1.16 (H) 05/17/2022 0351   CREATININE 1.06 11/14/2016 1050   CALCIUM 9.0 05/17/2022 0351   CALCIUM 9.6 06/26/2007 0000   PROT 7.2 05/17/2022 0351   PROT 7.2 09/14/2021 1331   ALBUMIN 3.1 (L) 05/17/2022 0351   ALBUMIN 4.1 09/14/2021 1331   AST 31 05/17/2022 0351   ALT 34 05/17/2022 0351   ALKPHOS 93 05/17/2022 0351   BILITOT 0.4 05/17/2022 0351   BILITOT 0.2 09/14/2021 1331   GFRNONAA >60 05/17/2022 0351   GFRAA 81 11/11/2020 1211       Component Value Date/Time   WBC 7.6 06/17/2022 1438   RBC 4.25 06/17/2022 1438   HGB 11.1 (L) 06/17/2022 1438   HGB 10.7 (L) 09/14/2021 1332   HCT 35.3 (L) 06/17/2022 1438   HCT 34.3 09/14/2021 1332   PLT 258.0 06/17/2022 1438   PLT 229 09/14/2021 1332   MCV 83.1 06/17/2022 1438   MCV 87 09/14/2021 1332  MCH 26.4 05/17/2022 0351   MCHC 31.4 06/17/2022 1438   RDW 17.7 (H) 06/17/2022 1438   RDW 15.0 09/14/2021 1332   LYMPHSABS 1.7 06/17/2022 1438   LYMPHSABS 1.4 07/25/2018 1234   MONOABS 0.5 06/17/2022 1438   EOSABS 0.2 06/17/2022 1438   EOSABS 0.2 07/25/2018 1234   BASOSABS 0.0 06/17/2022 1438   BASOSABS  0.0 07/25/2018 1234    No results found for: "POCLITH", "LITHIUM"   No results found for: "PHENYTOIN", "PHENOBARB", "VALPROATE", "CBMZ"   .res Assessment: Plan:    Plan:   PDMP reviewed  Zoloft '100mg'$  daily - take two tablets daily Rexulti 0.'5mg'$  daily  RTC 3 months  Patient advised to contact office with any questions, adverse effects, or acute worsening in signs and symptoms.  Time spent with patient was 20 minutes. Greater than 50% of face to face time with patient was spent on counseling and coordination of care.    Discussed potential metabolic side effects associated with atypical antipsychotics, as well as potential risk for movement side effects. Advised pt to contact office if movement side effects occur   Diagnoses and all orders for this visit:  Major depressive disorder, recurrent episode, moderate ([place])  Obsessional thoughts  Insomnia, unspecified type     Please see After Visit Summary for patient specific instructions.  Future Appointments  Date Time Provider [place]  07/11/2022 12:20 PM Laqueta Linden, MD [facility] None  07/20/2022  1:00 PM Cottle, Lucious Groves, LCSW [facility] None  07/25/2022  3:00 PM Whitmire, Joneen Boers, FNP [facility] None  07/29/2022  1:20 PM Jettie Booze, MD [facility]  08/03/2022  1:00 PM Cottle, Bambi G, LCSW [facility] None  08/09/2022  1:45 PM Rigoberto Noel, MD [facility] None  08/17/2022  1:00 PM Cottle, Bambi G, LCSW [facility] None  09/14/2022  1:00 PM Cottle, Bambi G, LCSW [facility] None  09/28/2022  1:00 PM Cottle, Bambi G, LCSW [facility] None  10/12/2022  1:00 PM Cottle, Bambi G, LCSW [facility] None  10/26/2022  1:00 PM Cottle, Bambi G, LCSW [facility] None  11/09/2022  1:00 PM Cottle, Bambi G, LCSW [facility] None  11/23/2022  1:00 PM Cottle, Bambi G, LCSW [facility] None  06/23/2023  2:00 PM Biagio Borg, MD [facility] None    No orders of the defined types were placed in this encounter.      -------------------------------

## 2022-07-09 ENCOUNTER — Other Ambulatory Visit (HOSPITAL_COMMUNITY): Payer: Self-pay

## 2022-07-11 ENCOUNTER — Ambulatory Visit (INDEPENDENT_AMBULATORY_CARE_PROVIDER_SITE_OTHER): Payer: BC Managed Care – PPO | Admitting: Family Medicine

## 2022-07-11 ENCOUNTER — Encounter (INDEPENDENT_AMBULATORY_CARE_PROVIDER_SITE_OTHER): Payer: Self-pay | Admitting: Family Medicine

## 2022-07-11 ENCOUNTER — Other Ambulatory Visit (HOSPITAL_COMMUNITY): Payer: Self-pay

## 2022-07-11 VITALS — BP 128/72 | HR 69 | Temp 98.2°F | Ht 63.0 in | Wt >= 6400 oz

## 2022-07-11 DIAGNOSIS — E1165 Type 2 diabetes mellitus with hyperglycemia: Secondary | ICD-10-CM | POA: Diagnosis not present

## 2022-07-11 DIAGNOSIS — E669 Obesity, unspecified: Secondary | ICD-10-CM | POA: Diagnosis not present

## 2022-07-11 DIAGNOSIS — E1169 Type 2 diabetes mellitus with other specified complication: Secondary | ICD-10-CM

## 2022-07-11 DIAGNOSIS — Z7985 Long-term (current) use of injectable non-insulin antidiabetic drugs: Secondary | ICD-10-CM

## 2022-07-11 DIAGNOSIS — Z6841 Body Mass Index (BMI) 40.0 and over, adult: Secondary | ICD-10-CM | POA: Diagnosis not present

## 2022-07-11 DIAGNOSIS — E66813 Obesity, class 3: Secondary | ICD-10-CM

## 2022-07-11 DIAGNOSIS — I5032 Chronic diastolic (congestive) heart failure: Secondary | ICD-10-CM | POA: Diagnosis not present

## 2022-07-11 MED ORDER — SEMAGLUTIDE (2 MG/DOSE) 8 MG/3ML ~~LOC~~ SOPN
2.0000 mg | PEN_INJECTOR | SUBCUTANEOUS | 0 refills | Status: DC
  Filled 2022-07-11: qty 3, 28d supply, fill #0

## 2022-07-13 ENCOUNTER — Ambulatory Visit: Payer: BC Managed Care – PPO | Admitting: Psychology

## 2022-07-15 ENCOUNTER — Other Ambulatory Visit (HOSPITAL_COMMUNITY): Payer: Self-pay

## 2022-07-16 ENCOUNTER — Other Ambulatory Visit (HOSPITAL_COMMUNITY): Payer: Self-pay

## 2022-07-19 NOTE — Progress Notes (Signed)
Chief Complaint:   OBESITY Alexandra Beck is here to discuss her progress with her obesity treatment plan along with follow-up of her obesity related diagnoses. Alexandra Beck is on the Category 4 Plan and keeping a food journal and adhering to recommended goals of 350-450 calories and 30 grams of protein and states she is following her eating plan approximately 70% of the time. Alexandra Beck states she is exercising 0 minutes 0 times per week.  Today's visit was #: 67 Starting weight: 468 lbs Starting date: 04/17/2018 Today's weight: 440 lbs Today's date: 07/11/2022 Total lbs lost to date: 28 lbs Total lbs lost since last in-office visit: 0  Interim History: Alexandra Beck voices she has really struggled in the last few weeks with a change in her time/schedule at work. Realizes she and her wife have eaten out more than she previously anticipated. Back on normal schedule starting yesterday. Feeling more edematous.  Subjective:   1. Type 2 diabetes mellitus with hyperglycemia, without long-term current use of insulin (HCC) Anelis is currently on Ozempic 2 mg SubQ weekly. Her last A1c 5.8. Denies GI side effects noted on Ozempic.  2. Chronic diastolic CHF (congestive heart failure) (HCC) Alexandra Beck sees cardiology, has an appointment in a few days. She was without diuretic for 1 week. Alexandra Beck is feeling some symptoms of retention.  Assessment/Plan:   1. Type 2 diabetes mellitus with hyperglycemia, without long-term current use of insulin (HCC) We will refill Ozempic 2 mg SubQ once weekly for 1 month with 0 refills.  -Refill Semaglutide, 2 MG/DOSE, 8 MG/3ML SOPN; Inject 2 mg as directed once a week.  Dispense: 3 mL; Refill: 0  2. Chronic diastolic CHF (congestive heart failure) (HCC) Lasandra will follow up on plan after she sees cardiology in 4 days.  3. Obesity with current BMI of 77.9 Alexandra Beck is currently in the action stage of change. As such, her goal is to continue with weight loss efforts. She has agreed to the Category  4 Plan and keeping a food journal and adhering to recommended goals of 350-450 calories and 30+ grams of protein.   Exercise goals: All adults should avoid inactivity. Some physical activity is better than none, and adults who participate in any amount of physical activity gain some health benefits.  Behavioral modification strategies: increasing lean protein intake, meal planning and cooking strategies, keeping healthy foods in the home, and planning for success.  Alexandra Beck has agreed to follow-up with our clinic in 3 weeks. She was informed of the importance of frequent follow-up visits to maximize her success with intensive lifestyle modifications for her multiple health conditions.   Objective:   Blood pressure 128/72, pulse 69, temperature 98.2 F (36.8 C), height '5\' 3"'$  (1.6 m), weight (!) 440 lb (199.6 kg), SpO2 93 %. Body mass index is 77.94 kg/m.  General: Cooperative, alert, well developed, in no acute distress. HEENT: Conjunctivae and lids unremarkable. Cardiovascular: Regular rhythm.  Lungs: Normal work of breathing. Neurologic: No focal deficits.   Lab Results  Component Value Date   CREATININE 1.16 (H) 05/17/2022   BUN 18 05/17/2022   NA 140 05/17/2022   K 3.5 05/17/2022   CL 100 05/17/2022   CO2 29 05/17/2022   Lab Results  Component Value Date   ALT 34 05/17/2022   AST 31 05/17/2022   ALKPHOS 93 05/17/2022   BILITOT 0.4 05/17/2022   Lab Results  Component Value Date   HGBA1C 5.8 (H) 05/13/2022   HGBA1C 5.9 (H) 11/11/2020   HGBA1C  5.6 12/17/2019   HGBA1C 5.6 07/04/2019   HGBA1C 5.4 12/04/2018   Lab Results  Component Value Date   INSULIN 23.6 11/11/2020   INSULIN 17.1 12/17/2019   INSULIN 23.5 07/04/2019   INSULIN 25.8 (H) 12/04/2018   INSULIN 24.7 07/25/2018   Lab Results  Component Value Date   TSH 4.38 06/17/2022   Lab Results  Component Value Date   CHOL 185 06/17/2022   HDL 53.70 06/17/2022   LDLCALC 94 06/17/2022   LDLDIRECT 141.0  03/02/2016   TRIG 187.0 (H) 06/17/2022   CHOLHDL 3 06/17/2022   Lab Results  Component Value Date   VD25OH 30.2 11/11/2020   VD25OH 38.2 12/17/2019   VD25OH 34.6 07/04/2019   Lab Results  Component Value Date   WBC 7.6 06/17/2022   HGB 11.1 (L) 06/17/2022   HCT 35.3 (L) 06/17/2022   MCV 83.1 06/17/2022   PLT 258.0 06/17/2022   Lab Results  Component Value Date   IRON 34 (L) 06/17/2022   TIBC 527.8 (H) 06/17/2022   FERRITIN 15.4 06/17/2022   Attestation Statements:   Reviewed by clinician on day of visit: allergies, medications, problem list, medical history, surgical history, family history, social history, and previous encounter notes.  I, Elnora Morrison, RMA am acting as transcriptionist for Coralie Common, MD.  I have reviewed the above documentation for accuracy and completeness, and I agree with the above. - Coralie Common, MD

## 2022-07-20 ENCOUNTER — Ambulatory Visit: Payer: BC Managed Care – PPO | Admitting: Psychology

## 2022-07-20 ENCOUNTER — Ambulatory Visit (INDEPENDENT_AMBULATORY_CARE_PROVIDER_SITE_OTHER): Payer: BC Managed Care – PPO | Admitting: Psychology

## 2022-07-20 DIAGNOSIS — F428 Other obsessive-compulsive disorder: Secondary | ICD-10-CM | POA: Diagnosis not present

## 2022-07-20 DIAGNOSIS — F331 Major depressive disorder, recurrent, moderate: Secondary | ICD-10-CM | POA: Diagnosis not present

## 2022-07-20 NOTE — Progress Notes (Signed)
[facility] Counselor/Therapist Progress Note  Patient ID: Alexandra Beck, MRN: [id number],    Date: 07/20/2022  Time Spent: 60 minutes  Treatment Type: Individual Therapy  Reported Symptoms: sadness,   Mental Status Exam: Appearance:  Casual     Behavior: Appropriate  Motor: Normal  Speech/Language:  Normal Rate  Affect: Appropriate  Mood: Pleasant  Thought process: normal  Thought content:   WNL  Sensory/Perceptual disturbances:   WNL  Orientation: oriented to person, place, time/date, and situation  Attention: Good  Concentration: Good  Memory: WNL  Fund of knowledge:  Good  Insight:   Good  Judgment:  Good  Impulse Control: Good   Risk Assessment: Danger to Self:  No Self-injurious Behavior: No Danger to Others: No Duty to Warn:no Physical Aggression / Violence:No  Access to Firearms a concern: No  Gang Involvement:No   Subjective: The patient attended a face-to-face individual therapy session today via video visit .  The patient gave verbal consent for the video visit to be on WebEx.  The patient was in her home alone and the therapist was in the office.   The patient presents as pleasant and cooperative.  The patient continues to do well with her medications and with the therapy that we have done so far.  She and her spouse seem to be getting along better however they had a hiccup with some communication this morning.  The patient is doing much better at not turning everything around on herself as if she is the cause of all the problems.  We processed what happened this morning and some different ways to have handled the situation.  Currently the patient is working on trying to be more mindful about attending to her wife.  We talked about some strategies to help her do this.  Interventions: Cognitive Behavioral Therapy, Assertiveness/Communication, and Problem solving  Diagnosis:Major depressive disorder, recurrent episode, moderate  (HCC)  Obsessional thoughts  Plan: Treatment Plan  Strengths/Abilities:  Intelligent, ability for insight, supportive wife  Treatment Preferences:  Outpatient Individual therapy  Statement of Needs:  "I Need help with my depression and motivation. "  Symptoms:Depressed or irritable mood.:(Status: improved). Diminished interest in or  enjoyment of activities.(Status: improved). Feelings of hopelessness,  worthlessness, or inappropriate guilt.: (Status: improved). History of chronic or recurrent depression for which the client has taken antidepressant medication, been hospitalized, had  outpatient treatment, or had a course of electroconvulsive therapy.(Status:  maintained). Lack of energy.: (Status: maintained). Low self-esteem.:  (Status: improved). Poor concentration and indecisiveness.:  (Status: improved). Social withdrawal.:  (Status: maintained).  Unresolved grief issues.: (Status: maintained).  Problems Addressed:  Unipolar Depression  Goals:  LTG:1. Alleviate depressive symptoms and return to previous level of effective  functioning.  60% 2. Appropriately grieve the loss in order to normalize mood and to return  to previously adaptive level of functioning. 3. Develop healthy interpersonal relationships that lead to the alleviation  and help prevent the relapse of depression.  60% 4. Develop healthy thinking patterns and beliefs about self, others, and the world that lead to the alleviation and help prevent the relapse of  Depression  60% STG:1.Describe current and past experiences with depression including their impact on functioning and  attempts to resolve it.  60 % 2. Identify and replace thoughts and beliefs that support depression.  50 % 3.Learn and implement behavioral strategies to overcome depression. 50% 4.Verbalize an understanding of healthy and unhealthy emotions with the intent of increasing the use of  healthy  emotions to guide actions.  30 %  Target Date:   10/07/2022 Frequency:  Weekly Modality:Individual Interventions by Therapist:  CBT, problem solving, EMDR, insight oriented  Patient approved Treatment Plan  Maizey Menendez G Sula Fetterly, LCSW                            Bently Morath G Falicia Lizotte, LCSW               Carianne Taira G Mrk Buzby, LCSW               Arshad Oberholzer [employer], LCSW               Fabrizzio Marcella [employer], LCSW               Nollan Muldrow [employer], LCSW               Kelsea Mousel [employer], LCSW               Reeanna Acri [employer], LCSW               Kenyatta Keidel [employer], LCSW               Ivett Luebbe [employer], LCSW               Lacinda Curvin [employer], LCSW               Blease Capaldi [employer], LCSW               Aleicia Kenagy [employer], LCSW               Edilson Vital [employer], LCSW               Anyeli Hockenbury [employer], LCSW               Shabre Kreher [employer], LCSW               Delise Simenson [employer], LCSW               Tanya Marvin [employer], LCSW               Jamariah Tony [employer], LCSW               Davyd Podgorski [employer], LCSW               Makailah Slavick [employer], LCSW               Indianna Boran G [place], LCSW               Eyan Hagood G [place], LCSW               Racer Quam G [place], LCSW               Pacey Willadsen G Timberlyn Pickford, LCSW               Jeselle Hiser [place], LCSW

## 2022-07-21 NOTE — Progress Notes (Deleted)
TeleHealth Visit:  This visit was completed with telemedicine (audio/video) technology. Alexandra Beck has verbally consented to this TeleHealth visit. The patient is located at home, the provider is located at home. The participants in this visit include the listed provider and patient. The visit was conducted today via MyChart video.  OBESITY Alexandra Beck is here to discuss her progress with her obesity treatment plan along with follow-up of her obesity related diagnoses.   Today's visit was # 48 Starting weight: 468 lbs Starting date: 04/17/2018 Weight at last in office visit: 440 lbs on 07/11/22 Total weight loss: 28 lbs at last in office visit on 07/11/22. Today's reported weight: *** lbs No weight reported.  Nutrition Plan: the Category 4 Plan and keeping a food journal and adhering to recommended goals of 350-450 calories and 30 gms protein at breakfast Hunger is {EWCONTROLASSESSMENT:24261}. Cravings are {EWCONTROLASSESSMENT:24261}.  Current exercise: {exercise types:16438}  Interim History: ***  Assessment/Plan:  1. ***  2. ***  3. ***  Obesity: Current BMI *** [place] {CHL AMB IS/IS NOT:210130109} currently in the action stage of change. As such, her goal is to {MWMwtloss#1:210800005}.  She has agreed to {MWMwtlossportion/plan2:23431}.   Exercise goals: {MWM EXERCISE RECS:23473}  Behavioral modification strategies: {MWMwtlossdietstrategies3:23432}.  [name] has agreed to follow-up with our clinic in {NUMBER 1-10:22536} weeks.   No orders of the defined types were placed in this encounter.   There are no discontinued medications.   No orders of the defined types were placed in this encounter.     Objective:   VITALS: Per patient if applicable, see vitals. GENERAL: Alert and in no acute distress. CARDIOPULMONARY: No increased WOB. Speaking in clear sentences.  PSYCH: Pleasant and cooperative. Speech normal rate and rhythm. Affect is appropriate. Insight and judgement are  appropriate. Attention is focused, linear, and appropriate.  NEURO: Oriented as arrived to appointment on time with no prompting.   Lab Results  Component Value Date   CREATININE 1.16 (H) 05/17/2022   BUN 18 05/17/2022   NA 140 05/17/2022   K 3.5 05/17/2022   CL 100 05/17/2022   CO2 29 05/17/2022   Lab Results  Component Value Date   ALT 34 05/17/2022   AST 31 05/17/2022   ALKPHOS 93 05/17/2022   BILITOT 0.4 05/17/2022   Lab Results  Component Value Date   HGBA1C 5.8 (H) 05/13/2022   HGBA1C 5.9 (H) 11/11/2020   HGBA1C 5.6 12/17/2019   HGBA1C 5.6 07/04/2019   HGBA1C 5.4 12/04/2018   Lab Results  Component Value Date   INSULIN 23.6 11/11/2020   INSULIN 17.1 12/17/2019   INSULIN 23.5 07/04/2019   INSULIN 25.8 (H) 12/04/2018   INSULIN 24.7 07/25/2018   Lab Results  Component Value Date   TSH 4.38 06/17/2022   Lab Results  Component Value Date   CHOL 185 06/17/2022   HDL 53.70 06/17/2022   LDLCALC 94 06/17/2022   LDLDIRECT 141.0 03/02/2016   TRIG 187.0 (H) 06/17/2022   CHOLHDL 3 06/17/2022   Lab Results  Component Value Date   WBC 7.6 06/17/2022   HGB 11.1 (L) 06/17/2022   HCT 35.3 (L) 06/17/2022   MCV 83.1 06/17/2022   PLT 258.0 06/17/2022   Lab Results  Component Value Date   IRON 34 (L) 06/17/2022   TIBC 527.8 (H) 06/17/2022   FERRITIN 15.4 06/17/2022   Lab Results  Component Value Date   VD25OH 30.2 11/11/2020   VD25OH 38.2 12/17/2019   VD25OH 34.6 07/04/2019    Attestation Statements:  Reviewed by clinician on day of visit: allergies, medications, problem list, medical history, surgical history, family history, social history, and previous encounter notes.  ***(delete if time-based billing not used) Time spent on visit including the items listed below was *** minutes.  -preparing to see the patient (e.g., review of tests, history, previous notes) -obtaining and/or reviewing separately obtained history -counseling and educating the  patient/family/caregiver -documenting clinical information in the electronic or other health record -ordering medications, tests, or procedures -independently interpreting results and communicating results to the patient/ family/caregiver -referring and communicating with other health care professionals  -care coordination

## 2022-07-25 ENCOUNTER — Telehealth (INDEPENDENT_AMBULATORY_CARE_PROVIDER_SITE_OTHER): Payer: Self-pay | Admitting: Family Medicine

## 2022-07-25 ENCOUNTER — Telehealth (INDEPENDENT_AMBULATORY_CARE_PROVIDER_SITE_OTHER): Payer: BC Managed Care – PPO | Admitting: Family Medicine

## 2022-07-25 ENCOUNTER — Other Ambulatory Visit (HOSPITAL_COMMUNITY): Payer: Self-pay

## 2022-07-25 NOTE — Telephone Encounter (Signed)
Patient was a no-show for virtual visit. Left VM advising patient to call to reschedule.  

## 2022-07-27 ENCOUNTER — Ambulatory Visit: Payer: BC Managed Care – PPO | Admitting: Psychology

## 2022-07-27 ENCOUNTER — Other Ambulatory Visit (HOSPITAL_COMMUNITY): Payer: Self-pay

## 2022-07-28 NOTE — Progress Notes (Signed)
Cardiology Office Note   Date:  07/29/2022   ID:  Alexandra Beck, DOB Dec 08, 1980, MRN [id number]  PCP:  Biagio Borg, MD    No chief complaint on file.  Chronic diastolic heart failure  Wt Readings from Last 3 Encounters:  07/29/22 (!) 445 lb 12.8 oz (202.2 kg)  07/11/22 (!) 440 lb (199.6 kg)  06/17/22 (!) 444 lb (201.4 kg)       History of Present Illness: Alexandra Beck is a 41 y.o. female  with h/o chronic diastolic heart failure, morbid obesity.   Had significant weight loss down to 380 lbs a few years ago in early 2020.    2019 echo showed: "Left ventricle: The cavity size was normal. Wall thickness was at    the upper limits of normal. Systolic function was normal. The    estimated ejection fraction was in the range of 50% to 55%.    Although no diagnostic regional wall motion abnormality was    identified, this possibility cannot be completely excluded on the    basis of this study given the poor acoustic windows.  - Aortic valve: Transvalvular velocity was within the normal range.    There was no stenosis. There was no regurgitation.  - Mitral valve: There was no regurgitation.  - Right ventricle: Systolic function was normal.  - Tricuspid valve: There was no significant regurgitation.  - Pulmonic valve: There was no significant regurgitation.  - Poor acoustic windows. Echo contrast used to improve    visualization. "  Records show: "She was admitted for fever and shortness of breath and has been treated for pneumonia.  CT PE protocol was concerning for edema, given IV lasix, then transitioned back to her home torsemide.  She's gradually improving on discharge, plan for discharge with outpatient follow up.  She's requiring O2 with activity at this time..  Echo with EF 55-60%, IVC normal in size with 50% resp variability Resume home torsemide at discharge spirionolactone, metoprolol, Losartan "  When she does not take her diuretic, she gets [facility] and ankle edema.    Being [facility]   Past Medical History:  Diagnosis Date   Acute renal failure (ARF) ([place]) 11/2012    multifactorial-likely secondary to ATN in the setting of sepsis and hypotension, and also from rhabdomyolysis   Anemia    Cellulitis    Chronic diastolic CHF (congestive heart failure) (HCC)    Depression    Diabetes mellitus (HCC)    GERD (gastroesophageal reflux disease)    Hyperlipidemia    Hypertension    Hypothyroidism    Morbid obesity with BMI of 70 and over, adult ([place])    Obesity hypoventilation syndrome (HCC)    OSA (obstructive sleep apnea)    PVC's (premature ventricular contractions)    Recurrent cellulitis of lower leg    LLE, venous insuff   Sepsis ([place]) 11/2012   Secondary to cellulitis   Sleep apnea    Venous insufficiency of leg     Past Surgical History:  Procedure Laterality Date   IR FLUORO GUIDE CV MIDLINE PICC RIGHT  03/28/2017   IR US GUIDE VASC ACCESS RIGHT  03/28/2017   WISDOM TOOTH EXTRACTION       Current Outpatient Medications  Medication Sig Dispense Refill   acetaminophen (TYLENOL) 500 MG tablet Take 1,000-2,000 mg by mouth every 6 (six) hours as needed for mild pain.     Brexpiprazole (REXULTI) 0.5 MG TABS Take  1 tablet by mouth daily. 30 tablet 2   ibuprofen (ADVIL) 200 MG tablet Take 800 mg by mouth every 6 (six) hours as needed for headache or moderate pain.     iron polysaccharides (NU-IRON) 150 MG capsule Take 1 capsule (150 mg total) by mouth daily. 90 capsule 1   levothyroxine (SYNTHROID) 150 MCG tablet Take 1 tablet (150 mcg total) by mouth daily before breakfast. 90 tablet 3   losartan (COZAAR) 100 MG tablet Take 1 tablet (100 mg total) by mouth daily. 90 tablet 3   metFORMIN (GLUCOPHAGE) 500 MG tablet TAKE 1 TABLET (500 MG) BY MOUTH DAILY 90 tablet 3   metoprolol succinate (TOPROL-XL) 50 MG 24 hr tablet TAKE 1 TABLET BY MOUTH DAILY 90 tablet 3   modafinil (PROVIGIL) 100 MG tablet Take 130m tablet by mouth  daily at 9am along with 200 mg tablet daily at 9am (total 3038m 60 tablet 3   modafinil (PROVIGIL) 200 MG tablet Take 1 tablet by mouth daily at 9am (along with 100 mg tablet daily at 9am (total 30024m 60 tablet 3   rosuvastatin (CRESTOR) 10 MG tablet TAKE 1 TABLET (10 MG) BY MOUTH DAILY 90 tablet 3   Semaglutide, 2 MG/DOSE, 8 MG/3ML SOPN Inject 2 mg as directed once a week. 3 mL 0   sertraline (ZOLOFT) 100 MG tablet Take 1 tablet by mouth 2 times daily. (Patient taking differently: Take 200 mg by mouth daily.) 180 tablet 3   spironolactone (ALDACTONE) 25 MG tablet Take 1 tablet (25 mg total) by mouth daily. 90 tablet 3   torsemide (DEMADEX) 20 MG tablet Take 4 tablets by mouth every morning. 120 tablet 1   traMADol (ULTRAM) 50 MG tablet Take 1 tablet (50 mg total) by mouth every 8 (eight) hours as needed. 40 tablet 0   triamcinolone cream (KENALOG) 0.1 % Apply 1 Application topically 2 (two) times daily. 30 g 3   No current facility-administered medications for this visit.    Allergies:   Aspirin, Doxycycline, Lisinopril, Niacin and related, and Niaspan [niacin er]    Social History:  The patient  reports that she quit smoking about 12 years ago. Her smoking use included cigarettes. She has a 4.00 pack-year smoking history. She has never used smokeless tobacco. She reports that she does not drink alcohol and does not use drugs.   Family History:  The patient's family history includes Diabetes in her mother; Heart disease in her father; High Cholesterol in her father and mother; Hypertension in her father and mother; Obesity in her father and mother; Sleep apnea in her father and mother; Thyroid disease in her mother.    ROS:  Please see the history of present illness.   Otherwise, review of systems are positive for LE edema, left chronically greater than right.   All other systems are reviewed and negative.    PHYSICAL EXAM: VS:  BP 138/70   Pulse 75   Ht _0  (1.6 m)   Wt (!) 445 lb  12.8 oz (202.2 kg)   SpO2 96%   BMI 78.97 kg/m  , BMI Body mass index is 78.97 kg/m. GEN: Well nourished, well developed, in no acute distress HEENT: normal Neck: no JVD, carotid bruits, or masses Cardiac: RRR; no murmurs, rubs, or gallops,; 1+ bilateral pitting LE edema  Respiratory:  clear to auscultation bilaterally, normal work of breathing GI: soft, nontender, nondistended, + BS, obese MS: no deformity or atrophy Skin: warm and dry, no rash Neuro:  Strength and sensation are intact Psych: euthymic mood, full affect   EKG:   The ekg ordered today demonstrates normal sinus rhythm, nonspecific ST-T wave changes   Recent Labs: 05/16/2022: B Natriuretic Peptide 46.6 05/17/2022: ALT 34; BUN 18; Creatinine, Ser 1.16; Magnesium 2.2; Potassium 3.5; Sodium 140 06/17/2022: Hemoglobin 11.1; Platelets 258.0; TSH 4.38   Lipid Panel    Component Value Date/Time   CHOL 185 06/17/2022 1438   CHOL 185 11/11/2020 1211   TRIG 187.0 (H) 06/17/2022 1438   HDL 53.70 06/17/2022 1438   HDL 53 11/11/2020 1211   CHOLHDL 3 06/17/2022 1438   VLDL 37.4 06/17/2022 1438   LDLCALC 94 06/17/2022 1438   LDLCALC 103 (H) 11/11/2020 1211   LDLDIRECT 141.0 03/02/2016 1101     Other studies Reviewed: Additional studies/ records that were reviewed today with results demonstrating: Hospital records reviewed.  Labs reviewed.   ASSESSMENT AND PLAN:  Chronic diastolic heart failure: Has been out of her diuretics for a few days.  She may need a p.m. dose as well on the torsemide.  We will call in torsemide 80 mg twice daily as needed.  She will take 80 mg in the morning and adjust the afternoon dose, typically 40 mg daily.  I think this will be more effective than once a day diuretic.  Her scale at home does not go up to her weight so she cannot use daily weights to measure fluid status. Morbid obesity: Working on weight loss with semaglutide.  HTN: Refill losartan.  The current medical regimen is effective;   continue present plan and medications. Plan for [facility] and visit in 6 months.  She also has frequent follow-up at [facility].    Current medicines are reviewed at length with the patient today.  The patient concerns regarding her medicines were addressed.  The following changes have been made: As above  Labs/ tests ordered today include: Follow-up to be met No orders of the defined types were placed in this encounter.   Recommend 150 minutes/week of aerobic exercise Low fat, low carb, high fiber diet recommended  Disposition:   FU in 6 months   Signed, Larae Grooms, MD  07/29/2022 1:41 PM    [facility] [address], [place], [place]  [address] Phone: [phone]; Fax: [phone]

## 2022-07-29 ENCOUNTER — Encounter: Payer: Self-pay | Admitting: Interventional Cardiology

## 2022-07-29 ENCOUNTER — Ambulatory Visit: Payer: BC Managed Care – PPO | Attending: Interventional Cardiology | Admitting: Interventional Cardiology

## 2022-07-29 ENCOUNTER — Other Ambulatory Visit (HOSPITAL_COMMUNITY): Payer: Self-pay

## 2022-07-29 VITALS — BP 138/70 | HR 75 | Ht 63.0 in | Wt >= 6400 oz

## 2022-07-29 DIAGNOSIS — I5032 Chronic diastolic (congestive) heart failure: Secondary | ICD-10-CM | POA: Diagnosis not present

## 2022-07-29 DIAGNOSIS — Z6841 Body Mass Index (BMI) 40.0 and over, adult: Secondary | ICD-10-CM

## 2022-07-29 DIAGNOSIS — I10 Essential (primary) hypertension: Secondary | ICD-10-CM

## 2022-07-29 MED ORDER — LOSARTAN POTASSIUM 100 MG PO TABS
100.0000 mg | ORAL_TABLET | Freq: Every day | ORAL | 3 refills | Status: DC
  Filled 2022-07-29 – 2022-08-28 (×2): qty 90, 90d supply, fill #0
  Filled 2022-12-06 – 2022-12-20 (×2): qty 90, 90d supply, fill #1
  Filled 2023-03-17: qty 90, 90d supply, fill #2
  Filled 2023-06-21: qty 90, 90d supply, fill #3

## 2022-07-29 MED ORDER — TORSEMIDE 20 MG PO TABS
80.0000 mg | ORAL_TABLET | Freq: Two times a day (BID) | ORAL | 3 refills | Status: DC | PRN
  Filled 2022-07-29: qty 720, fill #0
  Filled 2022-08-03: qty 720, 90d supply, fill #0
  Filled 2023-02-02: qty 720, 90d supply, fill #1
  Filled 2023-06-21: qty 720, 90d supply, fill #2

## 2022-07-29 NOTE — Patient Instructions (Signed)
Medication Instructions:  Your physician has recommended you make the following change in your medication: Change torsemide to 80 mg by mouth twice daily as needed  *If you need a refill on your cardiac medications before your next appointment, please call your pharmacy*   Lab Work: none If you have labs (blood work) drawn today and your tests are completely normal, you will receive your results only by: [place] (if you have MyChart) OR A paper copy in the mail If you have any lab test that is abnormal or we need to change your treatment, we will call you to review the results.   Testing/Procedures: none   Follow-Up: At [facility], you and your health needs are our priority.  As part of our continuing mission to provide you with exceptional heart care, we have created designated Provider Care Teams.  These Care Teams include your primary Cardiologist (physician) and Advanced Practice Providers (APPs -  Physician Assistants and Nurse Practitioners) who all work together to provide you with the care you need, when you need it.  We recommend signing up for the patient portal called "MyChart".  Sign up information is provided on this After Visit Summary.  MyChart is used to connect with patients for Virtual Visits (Telemedicine).  Patients are able to view lab/test results, encounter notes, upcoming appointments, etc.  Non-urgent messages can be sent to your provider as well.   To learn more about what you can do with MyChart, go to [email or web address].    Your next appointment:   February 10, 2023 at1:20  The format for your next appointment:   In Person  Provider:   Larae Grooms, MD     Other Instructions    Important Information About Sugar

## 2022-08-03 ENCOUNTER — Ambulatory Visit (INDEPENDENT_AMBULATORY_CARE_PROVIDER_SITE_OTHER): Payer: BC Managed Care – PPO | Admitting: Psychology

## 2022-08-03 ENCOUNTER — Other Ambulatory Visit (HOSPITAL_COMMUNITY): Payer: Self-pay

## 2022-08-03 ENCOUNTER — Ambulatory Visit: Payer: BC Managed Care – PPO | Admitting: Psychology

## 2022-08-03 DIAGNOSIS — F331 Major depressive disorder, recurrent, moderate: Secondary | ICD-10-CM

## 2022-08-03 DIAGNOSIS — F428 Other obsessive-compulsive disorder: Secondary | ICD-10-CM | POA: Diagnosis not present

## 2022-08-03 NOTE — Progress Notes (Signed)
[place] Counselor/Therapist Progress Note  Patient ID: KATHERINNE MOFIELD, MRN: [id number],    Date: 08/03/2022  Time Spent: 60 minutes  Treatment Type: Individual Therapy  Reported Symptoms: sadness,   Mental Status Exam: Appearance:  Casual     Behavior: Appropriate  Motor: Normal  Speech/Language:  Normal Rate  Affect: Appropriate  Mood: anxious  Thought process: normal  Thought content:   WNL  Sensory/Perceptual disturbances:   WNL  Orientation: oriented to person, place, time/date, and situation  Attention: Good  Concentration: Good  Memory: WNL  Fund of knowledge:  Good  Insight:   Good  Judgment:  Good  Impulse Control: Good   Risk Assessment: Danger to Self:  No Self-injurious Behavior: No Danger to Others: No Duty to Warn:no Physical Aggression / Violence:No  Access to Firearms a concern: No  Gang Involvement:No   Subjective: The patient attended a face-to-face individual therapy session today via video visit .  The patient gave verbal consent for the video visit to be on WebEx.  The patient was in her home alone and the therapist was in the office.   The patient presents as anxious.  The patient reports that it has been a difficult 2 weeks.  She states that they have been having some money issues.  She reports that they got a new cat and the cat costs a lot of money because they had to take it to the vet.  She also reported that her brother did not pay the credit card bill that he is supposed to pay which caused extra financial stress on the patient and her mother.  The patient also reported that her friend got arrested for not paying child support and that was stressful as well.  I gave the patient validation for handling the situation well and talked about not contributing to her on stress by taking on things that are not hers to take on.  The patient seems to be less depressed and handling things much better.  Interventions: Cognitive Behavioral  Therapy, Assertiveness/Communication, and Problem solving  Diagnosis:Major depressive disorder, recurrent episode, moderate (HCC)  Obsessional thoughts  Plan: Treatment Plan  Strengths/Abilities:  Intelligent, ability for insight, supportive wife  Treatment Preferences:  Outpatient Individual therapy  Statement of Needs:  "I Need help with my depression and motivation. "  Symptoms:Depressed or irritable mood.:(Status: improved). Diminished interest in or  enjoyment of activities.(Status: improved). Feelings of hopelessness,  worthlessness, or inappropriate guilt.: (Status: improved). History of chronic or recurrent depression for which the client has taken antidepressant medication, been hospitalized, had  outpatient treatment, or had a course of electroconvulsive therapy.(Status:  maintained). Lack of energy.: (Status: maintained). Low self-esteem.:  (Status: improved). Poor concentration and indecisiveness.:  (Status: improved). Social withdrawal.:  (Status: maintained).  Unresolved grief issues.: (Status: maintained).  Problems Addressed:  Unipolar Depression  Goals:  LTG:1. Alleviate depressive symptoms and return to previous level of effective  functioning.  60% 2. Appropriately grieve the loss in order to normalize mood and to return  to previously adaptive level of functioning. 3. Develop healthy interpersonal relationships that lead to the alleviation  and help prevent the relapse of depression.  60% 4. Develop healthy thinking patterns and beliefs about self, others, and the world that lead to the alleviation and help prevent the relapse of  Depression  60% STG:1.Describe current and past experiences with depression including their impact on functioning and  attempts to resolve it.  60 % 2. Identify and replace thoughts and  beliefs that support depression.  50 % 3.Learn and implement behavioral strategies to overcome depression. 50% 4.Verbalize an understanding of  healthy and unhealthy emotions with the intent of increasing the use of  healthy emotions to guide actions.  30 %  Target Date:  10/07/2022 Frequency:  Weekly Modality:Individual Interventions by Therapist:  CBT, problem solving, EMDR, insight oriented  Patient approved Treatment Plan  Bambie Pizzolato G Aleathia Purdy, LCSW                                                                                                                                                                                                                                                                                                                                                                                              Fran Mcree G Darl Brisbin, LCSW

## 2022-08-04 ENCOUNTER — Other Ambulatory Visit (HOSPITAL_COMMUNITY): Payer: Self-pay

## 2022-08-05 ENCOUNTER — Other Ambulatory Visit (HOSPITAL_COMMUNITY): Payer: Self-pay

## 2022-08-09 ENCOUNTER — Encounter: Payer: Self-pay | Admitting: Pulmonary Disease

## 2022-08-09 ENCOUNTER — Ambulatory Visit: Payer: BC Managed Care – PPO | Admitting: Pulmonary Disease

## 2022-08-09 VITALS — BP 142/84 | HR 82 | Temp 97.6°F | Ht 62.5 in | Wt >= 6400 oz

## 2022-08-09 DIAGNOSIS — G4733 Obstructive sleep apnea (adult) (pediatric): Secondary | ICD-10-CM | POA: Diagnosis not present

## 2022-08-09 DIAGNOSIS — I5032 Chronic diastolic (congestive) heart failure: Secondary | ICD-10-CM | POA: Diagnosis not present

## 2022-08-09 DIAGNOSIS — G47419 Narcolepsy without cataplexy: Secondary | ICD-10-CM

## 2022-08-09 DIAGNOSIS — Z23 Encounter for immunization: Secondary | ICD-10-CM | POA: Diagnosis not present

## 2022-08-09 NOTE — Assessment & Plan Note (Signed)
Continue torsemide at current dosing, fluid status seems to be controlled.  I asked her to aim for new baseline weight of 433 pounds

## 2022-08-09 NOTE — Progress Notes (Signed)
   Subjective:    Patient ID: Alexandra Beck, female    DOB: 09/04/1981, 41 y.o.   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  She takes 80 mg of Lasix daily, baseline wt 455   She was hospitalized 04/2022 for acute respiratory failure.  She went into the ED with shortness of breath and fevers.  Treated for suspected With antibiotics given her recent URI symptoms, fever, cough and shortness of breath.  CT chest showed pulmonary edema and vascular congestion.  She was diuresed with good response and improved oxygen requirements.  She was able to be discharged on 6/20 on 2 L supplemental O2 with activity. Subsequently weaned off oxygen. Office visit from 06/03/2022 was reviewed She is on torsemide 80 mg twice daily which she takes on an as needed basis. Weight is down about 20 pounds to 433 pounds She obtained a new CPAP machine set at auto settings 12 to 20 cm and reports bloating in the morning and she has to burp in the morning. She remains on modafinil 200 mg in the morning which takes her through her workday  Significant tests/ events reviewed  PFTs 09/2021 moderate restriction, no airway obstruction ABG 06/2014 7.3 5/51/95   NPSG 2009:  AHI 93/hr On auto 5-20cm    ABG  06/2014 showing mild hypercarbia 7.35/50 1/90 5/97%    CPAP titration 05/2018 >> CPAP 15 + 3L O2 (434 lbs )   MSLT 05/2018 >> SOREMs x3   Review of Systems neg for any significant sore throat, dysphagia, itching, sneezing, nasal congestion or excess/ purulent secretions, fever, chills, sweats, unintended wt loss, pleuritic or exertional cp, hempoptysis, orthopnea pnd or change in chronic leg swelling. Also denies presyncope, palpitations, heartburn, abdominal pain, nausea, vomiting, diarrhea or change in bowel or urinary habits,  dysuria,hematuria, rash, arthralgias, visual complaints, headache, numbness weakness or ataxia.     Objective:   Physical Exam   Gen. Pleasant, morbidly obese, in no distress ENT - no lesions, no post nasal drip Neck: No JVD, no thyromegaly, no carotid bruits Lungs: no use of accessory muscles, no dullness to percussion, decreased without rales or rhonchi  Cardiovascular: Rhythm regular, heart sounds  normal, no murmurs or gallops, no peripheral edema Musculoskeletal: No deformities, no cyanosis or clubbing , no tremors        Assessment & Plan:

## 2022-08-09 NOTE — Assessment & Plan Note (Signed)
CPAP download was reviewed which shows average pressure of 16 cm on auto settings 12 to 20 cm with maximum pressure of 18 cm, residual AHI of 7/however with large leak, EPR level is 1. We will change settings and increase EPR level to 3 to decrease her feeling of bloating.  She may eventually need BiPAP if this does not suffice. We will recheck residual AHI in 6 months. She does not need a ramp setting and we can discontinue. Overall she is very compliant and CPAP is only helped improve her daytime somnolence and fatigue  Weight loss encouraged, compliance with goal of at least 4-6 hrs every night is the expectation. Advised against medications with sedative side effects Cautioned against driving when sleepy - understanding that sleepiness will vary on a day to day basis

## 2022-08-09 NOTE — Assessment & Plan Note (Addendum)
Modafinil seems to work for persistent hypersomnolence and will continue at 300 mg daily. She does not have any episodes of cataplexy Refills for modafinil will be provided as needed We discussed the role of exercise program and sleep schedule

## 2022-08-09 NOTE — Patient Instructions (Signed)
X Decrease ramp to 0 Increase EPR setting to 3

## 2022-08-10 ENCOUNTER — Ambulatory Visit: Payer: BC Managed Care – PPO | Admitting: Psychology

## 2022-08-14 ENCOUNTER — Other Ambulatory Visit (HOSPITAL_COMMUNITY): Payer: Self-pay

## 2022-08-15 ENCOUNTER — Ambulatory Visit (INDEPENDENT_AMBULATORY_CARE_PROVIDER_SITE_OTHER): Payer: BC Managed Care – PPO | Admitting: Family Medicine

## 2022-08-15 ENCOUNTER — Other Ambulatory Visit (HOSPITAL_COMMUNITY): Payer: Self-pay

## 2022-08-15 ENCOUNTER — Encounter (INDEPENDENT_AMBULATORY_CARE_PROVIDER_SITE_OTHER): Payer: Self-pay | Admitting: Family Medicine

## 2022-08-15 VITALS — BP 126/52 | HR 76 | Temp 98.6°F | Ht 63.0 in | Wt >= 6400 oz

## 2022-08-15 DIAGNOSIS — E1159 Type 2 diabetes mellitus with other circulatory complications: Secondary | ICD-10-CM

## 2022-08-15 DIAGNOSIS — Z7984 Long term (current) use of oral hypoglycemic drugs: Secondary | ICD-10-CM

## 2022-08-15 DIAGNOSIS — Z6841 Body Mass Index (BMI) 40.0 and over, adult: Secondary | ICD-10-CM

## 2022-08-15 DIAGNOSIS — I152 Hypertension secondary to endocrine disorders: Secondary | ICD-10-CM

## 2022-08-15 DIAGNOSIS — E669 Obesity, unspecified: Secondary | ICD-10-CM | POA: Diagnosis not present

## 2022-08-15 DIAGNOSIS — E1165 Type 2 diabetes mellitus with hyperglycemia: Secondary | ICD-10-CM | POA: Diagnosis not present

## 2022-08-15 MED ORDER — SEMAGLUTIDE (2 MG/DOSE) 8 MG/3ML ~~LOC~~ SOPN
2.0000 mg | PEN_INJECTOR | SUBCUTANEOUS | 0 refills | Status: DC
  Filled 2022-08-15: qty 3, 28d supply, fill #0

## 2022-08-16 NOTE — Progress Notes (Signed)
Chief Complaint:   OBESITY Alexandra Beck is here to discuss her progress with her obesity treatment plan along with follow-up of her obesity related diagnoses. Alexandra Beck is on the Category 4 Plan and keeping a food journal and adhering to recommended goals of 350-450 calories and 40 protein and states she is following her eating plan approximately 65% of the time. Alexandra Beck states she is excising 0 minutes 0 times per week.  Today's visit was #: 64 Starting weight: 468 lbs Starting date: 04/17/2018 Today's weight: 430 lbs Today's date: 08/15/2022 Total lbs lost to date: 38 lbs Total lbs lost since last in-office visit: 10  Interim History: Since Alexandra Beck's last appointment she has tried factor meals and really enjoyed them as dinner meals. Liked these options more than a frozen dinner. Getting tired of sandwiches as lunch option. May look into doing more soup options. 15 year anniversary coming up.  Subjective:   1. Type 2 diabetes mellitus with hyperglycemia, without long-term current use of insulin (HCC) Alexandra Beck's last A1c 5.8, last insulin 23.6. Noticing better control of food choice intake choices of 2 mg on Glucophage.  2. Hypertension associated with diabetes ([place]) Alexandra Beck's blood pressure is well controlled today. Denies chest pain, chest pressure and headache. She sees cardiology.  Assessment/Plan:   1. Type 2 diabetes mellitus with hyperglycemia, without long-term current use of insulin (HCC) We will refill Ozempic 2 mg SubQ once weekly for 1 month with 0 refills.  -Refill Semaglutide, 2 MG/DOSE, 8 MG/3ML SOPN; Inject 2 mg into the skin as directed once a week.  Dispense: 3 mL; Refill: 0  2. Hypertension associated with diabetes ([place]) Continue current medications--follow up with blood pressure at next appointment.  3. Obesity with current BMI of 76.2 Alexandra Beck is currently in the action stage of change. As such, her goal is to continue with weight loss efforts. She has agreed to the Category 4  Plan and keeping a food journal and adhering to recommended goals of 350-450 calories and 30 protein.   Exercise goals: All adults should avoid inactivity. Some physical activity is better than none, and adults who participate in any amount of physical activity gain some health benefits.  Behavioral modification strategies: increasing lean protein intake, meal planning and cooking strategies, and keeping healthy foods in the home.  Alexandra Beck has agreed to follow-up with our clinic in 3 weeks. She was informed of the importance of frequent follow-up visits to maximize her success with intensive lifestyle modifications for her multiple health conditions.   Objective:   Blood pressure (!) 126/52, pulse 76, temperature 98.6 F (37 C), height '5\' 3"'$  (1.6 m), weight (!) 430 lb (195 kg), SpO2 94 %. Body mass index is 76.17 kg/m.  General: Cooperative, alert, well developed, in no acute distress. HEENT: Conjunctivae and lids unremarkable. Cardiovascular: Regular rhythm.  Lungs: Normal work of breathing. Neurologic: No focal deficits.   Lab Results  Component Value Date   CREATININE 1.16 (H) 05/17/2022   BUN 18 05/17/2022   NA 140 05/17/2022   K 3.5 05/17/2022   CL 100 05/17/2022   CO2 29 05/17/2022   Lab Results  Component Value Date   ALT 34 05/17/2022   AST 31 05/17/2022   ALKPHOS 93 05/17/2022   BILITOT 0.4 05/17/2022   Lab Results  Component Value Date   HGBA1C 5.8 (H) 05/13/2022   HGBA1C 5.9 (H) 11/11/2020   HGBA1C 5.6 12/17/2019   HGBA1C 5.6 07/04/2019   HGBA1C 5.4 12/04/2018   Lab  Results  Component Value Date   INSULIN 23.6 11/11/2020   INSULIN 17.1 12/17/2019   INSULIN 23.5 07/04/2019   INSULIN 25.8 (H) 12/04/2018   INSULIN 24.7 07/25/2018   Lab Results  Component Value Date   TSH 4.38 06/17/2022   Lab Results  Component Value Date   CHOL 185 06/17/2022   HDL 53.70 06/17/2022   LDLCALC 94 06/17/2022   LDLDIRECT 141.0 03/02/2016   TRIG 187.0 (H) 06/17/2022    CHOLHDL 3 06/17/2022   Lab Results  Component Value Date   VD25OH 30.2 11/11/2020   VD25OH 38.2 12/17/2019   VD25OH 34.6 07/04/2019   Lab Results  Component Value Date   WBC 7.6 06/17/2022   HGB 11.1 (L) 06/17/2022   HCT 35.3 (L) 06/17/2022   MCV 83.1 06/17/2022   PLT 258.0 06/17/2022   Lab Results  Component Value Date   IRON 34 (L) 06/17/2022   TIBC 527.8 (H) 06/17/2022   FERRITIN 15.4 06/17/2022   Attestation Statements:   Reviewed by clinician on day of visit: allergies, medications, problem list, medical history, surgical history, family history, social history, and previous encounter notes.  I, Alexandra Beck, RMA am acting as transcriptionist for Alexandra Common, MD.  I have reviewed the above documentation for accuracy and completeness, and I agree with the above. - Alexandra Common, MD

## 2022-08-17 ENCOUNTER — Ambulatory Visit (INDEPENDENT_AMBULATORY_CARE_PROVIDER_SITE_OTHER): Payer: BC Managed Care – PPO | Admitting: Psychology

## 2022-08-17 ENCOUNTER — Other Ambulatory Visit (HOSPITAL_COMMUNITY): Payer: Self-pay

## 2022-08-17 ENCOUNTER — Ambulatory Visit: Payer: BC Managed Care – PPO | Admitting: Psychology

## 2022-08-17 DIAGNOSIS — F331 Major depressive disorder, recurrent, moderate: Secondary | ICD-10-CM | POA: Diagnosis not present

## 2022-08-18 NOTE — Progress Notes (Signed)
[facility] Counselor/Therapist Progress Note  Patient ID: Alexandra Beck, MRN: [id number],    Date: 08/17/2022  Time Spent: 60 minutes  Treatment Type: Individual Therapy  Reported Symptoms: sadness,   Mental Status Exam: Appearance:  Casual     Behavior: Appropriate  Motor: Normal  Speech/Language:  Normal Rate  Affect: Appropriate  Mood: anxious  Thought process: normal  Thought content:   WNL  Sensory/Perceptual disturbances:   WNL  Orientation: oriented to person, place, time/date, and situation  Attention: Good  Concentration: Good  Memory: WNL  Fund of knowledge:  Good  Insight:   Good  Judgment:  Good  Impulse Control: Good   Risk Assessment: Danger to Self:  No Self-injurious Behavior: No Danger to Others: No Duty to Warn:no Physical Aggression / Violence:No  Access to Firearms a concern: No  Gang Involvement:No   Subjective: The patient attended a face-to-face individual therapy session today via video visit .  The patient gave verbal consent for the video visit to be on WebEx.  The patient was in her home alone and the therapist was in the office.  The patient presents as pleasant and cooperative.  The patient reports that things have been going well.  She states that she has lost 10 more pounds since I last saw her.  I gave her some positive feedback for this.  The patient talked about wanting to work on her self esteem.  We defined what self esteem is and talked about being able to recognize the things that she likes and work on the things that she does not like.  We worked on identifying the things that she does like about herself.  We talked about the importance of focusing on the good things about her personality and if she is not happy with the package, working on that.  We talked about saying positive and being less negative about herself.   Interventions: Cognitive Behavioral Therapy, Assertiveness/Communication, and Problem  solving  Diagnosis:Major depressive disorder, recurrent episode, moderate (HCC)  Plan: Treatment Plan  Strengths/Abilities:  Intelligent, ability for insight, supportive wife  Treatment Preferences:  Outpatient Individual therapy  Statement of Needs:  "I Need help with my depression and motivation. "  Symptoms:Depressed or irritable mood.:(Status: improved). Diminished interest in or  enjoyment of activities.(Status: improved). Feelings of hopelessness,  worthlessness, or inappropriate guilt.: (Status: improved). History of chronic or recurrent depression for which the client has taken antidepressant medication, been hospitalized, had  outpatient treatment, or had a course of electroconvulsive therapy.(Status:  maintained). Lack of energy.: (Status: maintained). Low self-esteem.:  (Status: improved). Poor concentration and indecisiveness.:  (Status: improved). Social withdrawal.:  (Status: maintained).  Unresolved grief issues.: (Status: maintained).  Problems Addressed:  Unipolar Depression  Goals:  LTG:1. Alleviate depressive symptoms and return to previous level of effective  functioning.  60% 2. Appropriately grieve the loss in order to normalize mood and to return  to previously adaptive level of functioning. 3. Develop healthy interpersonal relationships that lead to the alleviation  and help prevent the relapse of depression.  60% 4. Develop healthy thinking patterns and beliefs about self, others, and the world that lead to the alleviation and help prevent the relapse of  Depression  60% STG:1.Describe current and past experiences with depression including their impact on functioning and  attempts to resolve it.  60 % 2. Identify and replace thoughts and beliefs that support depression.  50 % 3.Learn and implement behavioral strategies to overcome depression. 50%  4.Verbalize an understanding of healthy and unhealthy emotions with the intent of increasing the use of   healthy emotions to guide actions.  30 %  Target Date:  10/07/2022 Frequency:  Weekly Modality:Individual Interventions by Therapist:  CBT, problem solving, EMDR, insight oriented  Patient approved Treatment Plan  Alexandra Ollis Rejeana Brock, LCSW

## 2022-08-24 ENCOUNTER — Ambulatory Visit: Payer: BC Managed Care – PPO | Admitting: Psychology

## 2022-08-29 ENCOUNTER — Other Ambulatory Visit (HOSPITAL_COMMUNITY): Payer: Self-pay

## 2022-08-31 ENCOUNTER — Ambulatory Visit: Payer: BC Managed Care – PPO | Admitting: Psychology

## 2022-09-01 NOTE — Progress Notes (Signed)
TeleHealth Visit:  This visit was completed with telemedicine (audio/video) technology. Alexandra Beck has verbally consented to this TeleHealth visit. The patient is located at home, the provider is located at home. The participants in this visit include the listed provider and patient. The visit was conducted today via MyChart video.  OBESITY Alexandra Beck is here to discuss her progress with her obesity treatment plan along with follow-up of her obesity related diagnoses.   Today's visit was # 31 Starting weight: 468 lbs Starting date: 04/17/2018 Weight at last in office visit: 430 lbs on 08/15/22 Total weight loss: 38 lbs at last in office visit on 08/15/22. Today's reported weight:  No weight reported  Nutrition Plan: the Category 4 Plan and keeping a food journal and adhering to recommended goals of 350-450 calories and 30 grams protein at breakfast- 65-70% adherence  Current exercise: no regular exercise.  However she is always aware of her breathing and how well she is doing with different lengths of walking.  Interim History: She recently celebrated her 15th wedding anniversary with some cake from [place]. Alexandra Beck reports that hunger and cravings are well controlled.  She actually is struggling to eat enough.  Sometimes she is just bored with all of the foods.  She and her wife have not been meal planning dinner as much as they were. However she feels protein intake is good.  She had a 10 pound weight loss at her last in office visit and I congratulated her on this.  She says she was surprised with the weight loss.  She says she has done a few chair exercises as suggested but feels she just does not have the stamina to exercise.  Assessment/Plan:  1. Fever She has a fever which started yesterday.  It was up to 102 during the night.  She is not feeling well due to her fever.  Reports she has been taking 2000 mg of acetaminophen at a time.  Plan: Advised her to take only 1000 mg of  acetaminophen at a time-more than that can cause liver damage. May alternate ibuprofen with acetaminophen.  2.  Other depression with emotional eating Cravings are currently well controlled.  She reports that her mood is " pretty good" and much better than it was a year ago. She sees her psychiatrist around every 6 months and her counselor every 2 weeks.   She seems subdued today but this may be because she is not feeling well. She says she tends to under eat when depressed. She sees her counselor every 2 weeks-Bambi Cottle LCSW. Alexandra Beck, PMHNP provides medication management.  Medication(s): Sertraline 100 mg daily, Rexulti 0.5 mg daily.  Plan: Follow-up with Alexandra Beck, PMHNP and Bambi Cottle, LCSW as directed.  3. Obesity: Current BMI 76.2 Alexandra Beck is currently in the action stage of change. As such, her goal is to continue with weight loss efforts.  She has agreed to the Category 4 Plan.   Exercise goals: Increase activity level as tolerated.  Behavioral modification strategies: increasing lean protein intake, decreasing simple carbohydrates, meal planning and cooking strategies, and planning for success.  Alexandra Beck has agreed to follow-up with our clinic in 4 weeks.   No orders of the defined types were placed in this encounter.   There are no discontinued medications.   No orders of the defined types were placed in this encounter.     Objective:   VITALS: Per patient if applicable, see vitals. GENERAL: Alert and in no acute distress. CARDIOPULMONARY: No  increased WOB. Speaking in clear sentences.  PSYCH: Pleasant and cooperative. Speech normal rate and rhythm. Affect is appropriate. Insight and judgement are appropriate. Attention is focused, linear, and appropriate.  NEURO: Oriented as arrived to appointment on time with no prompting.   Lab Results  Component Value Date   CREATININE 1.16 (H) 05/17/2022   BUN 18 05/17/2022   NA 140 05/17/2022   K 3.5 05/17/2022    CL 100 05/17/2022   CO2 29 05/17/2022   Lab Results  Component Value Date   ALT 34 05/17/2022   AST 31 05/17/2022   ALKPHOS 93 05/17/2022   BILITOT 0.4 05/17/2022   Lab Results  Component Value Date   HGBA1C 5.8 (H) 05/13/2022   HGBA1C 5.9 (H) 11/11/2020   HGBA1C 5.6 12/17/2019   HGBA1C 5.6 07/04/2019   HGBA1C 5.4 12/04/2018   Lab Results  Component Value Date   INSULIN 23.6 11/11/2020   INSULIN 17.1 12/17/2019   INSULIN 23.5 07/04/2019   INSULIN 25.8 (H) 12/04/2018   INSULIN 24.7 07/25/2018   Lab Results  Component Value Date   TSH 4.38 06/17/2022   Lab Results  Component Value Date   CHOL 185 06/17/2022   HDL 53.70 06/17/2022   LDLCALC 94 06/17/2022   LDLDIRECT 141.0 03/02/2016   TRIG 187.0 (H) 06/17/2022   CHOLHDL 3 06/17/2022   Lab Results  Component Value Date   WBC 7.6 06/17/2022   HGB 11.1 (L) 06/17/2022   HCT 35.3 (L) 06/17/2022   MCV 83.1 06/17/2022   PLT 258.0 06/17/2022   Lab Results  Component Value Date   IRON 34 (L) 06/17/2022   TIBC 527.8 (H) 06/17/2022   FERRITIN 15.4 06/17/2022   Lab Results  Component Value Date   VD25OH 30.2 11/11/2020   VD25OH 38.2 12/17/2019   VD25OH 34.6 07/04/2019    Attestation Statements:   Reviewed by clinician on day of visit: allergies, medications, problem list, medical history, surgical history, family history, social history, and previous encounter notes.

## 2022-09-05 ENCOUNTER — Encounter (INDEPENDENT_AMBULATORY_CARE_PROVIDER_SITE_OTHER): Payer: Self-pay | Admitting: Family Medicine

## 2022-09-05 ENCOUNTER — Telehealth (INDEPENDENT_AMBULATORY_CARE_PROVIDER_SITE_OTHER): Payer: BC Managed Care – PPO | Admitting: Family Medicine

## 2022-09-05 DIAGNOSIS — E669 Obesity, unspecified: Secondary | ICD-10-CM

## 2022-09-05 DIAGNOSIS — Z6841 Body Mass Index (BMI) 40.0 and over, adult: Secondary | ICD-10-CM | POA: Diagnosis not present

## 2022-09-05 DIAGNOSIS — F3289 Other specified depressive episodes: Secondary | ICD-10-CM

## 2022-09-05 DIAGNOSIS — R509 Fever, unspecified: Secondary | ICD-10-CM

## 2022-09-07 ENCOUNTER — Ambulatory Visit: Payer: BC Managed Care – PPO | Admitting: Psychology

## 2022-09-10 ENCOUNTER — Other Ambulatory Visit (HOSPITAL_COMMUNITY): Payer: Self-pay

## 2022-09-12 ENCOUNTER — Other Ambulatory Visit (HOSPITAL_COMMUNITY): Payer: Self-pay

## 2022-09-14 ENCOUNTER — Ambulatory Visit (INDEPENDENT_AMBULATORY_CARE_PROVIDER_SITE_OTHER): Payer: BC Managed Care – PPO | Admitting: Psychology

## 2022-09-14 ENCOUNTER — Ambulatory Visit: Payer: BC Managed Care – PPO | Admitting: Psychology

## 2022-09-14 DIAGNOSIS — F331 Major depressive disorder, recurrent, moderate: Secondary | ICD-10-CM | POA: Diagnosis not present

## 2022-09-14 DIAGNOSIS — F428 Other obsessive-compulsive disorder: Secondary | ICD-10-CM

## 2022-09-14 NOTE — Progress Notes (Signed)
[facility] Counselor/Therapist Progress Note  Patient ID: Alexandra Beck, MRN: [id number],    Date: 09/14/2022  Time Spent: 60 minutes  Treatment Type: Individual Therapy  Reported Symptoms: sadness,   Mental Status Exam: Appearance:  Casual     Behavior: Appropriate  Motor: Normal  Speech/Language:  Normal Rate  Affect: Appropriate  Mood: anxious  Thought process: normal  Thought content:   WNL  Sensory/Perceptual disturbances:   WNL  Orientation: oriented to person, place, time/date, and situation  Attention: Good  Concentration: Good  Memory: WNL  Fund of knowledge:  Good  Insight:   Good  Judgment:  Good  Impulse Control: Good   Risk Assessment: Danger to Self:  No Self-injurious Behavior: No Danger to Others: No Duty to Warn:no Physical Aggression / Violence:No  Access to Firearms a concern: No  Gang Involvement:No   Subjective: The patient attended a face-to-face individual therapy session today via video visit .  The patient gave verbal consent for the video visit to be on WebEx.  The patient was in her home alone and the therapist was in the office.  The patient presents as pleasant and cooperative.  The patient reports that she is doing very very well.  The patient seems to be interacting better with her partner and she lost 10 pounds over the last month and she is doing great.  We talked about a situation with her brother and the credit card again and I made some suggestions on how she might want to consider handling the situation with him.  Apparently her mother pay the credit card off and her brother did not learn any responsibility whatsoever for taking care of of that bill.  I recommended that she might want to consider having a conversation as she feels frustrated because he did not learn anything and took advantage of the situation.  We talked about the words that she could potentially use in order to confront the situation with her brother.   The patient has made excellent progress in therapy and we will continue to work together to help her resolve some of her situations as she struggles sometimes with being too kind.  She needs to learn a little more about setting limits and boundaries.  Interventions: Cognitive Behavioral Therapy, Assertiveness/Communication, and Problem solving  Diagnosis:Major depressive disorder, recurrent episode, moderate (HCC)  Obsessional thoughts  Plan: Treatment Plan  Strengths/Abilities:  Intelligent, ability for insight, supportive wife  Treatment Preferences:  Outpatient Individual therapy  Statement of Needs:  "I Need help with my depression and motivation. "  Symptoms:Depressed or irritable mood.:(Status: improved). Diminished interest in or  enjoyment of activities.(Status: improved). Feelings of hopelessness,  worthlessness, or inappropriate guilt.: (Status: improved). History of chronic or recurrent depression for which the client has taken antidepressant medication, been hospitalized, had  outpatient treatment, or had a course of electroconvulsive therapy.(Status:  maintained). Lack of energy.: (Status: maintained). Low self-esteem.:  (Status: improved). Poor concentration and indecisiveness.:  (Status: improved). Social withdrawal.:  (Status: maintained).  Unresolved grief issues.: (Status: maintained).  Problems Addressed:  Unipolar Depression  Goals:  LTG:1. Alleviate depressive symptoms and return to previous level of effective  functioning.  60% 2. Appropriately grieve the loss in order to normalize mood and to return  to previously adaptive level of functioning. 3. Develop healthy interpersonal relationships that lead to the alleviation  and help prevent the relapse of depression.  60% 4. Develop healthy thinking patterns and beliefs about self, others, and the world  that lead to the alleviation and help prevent the relapse of  Depression  60% STG:1.Describe current and past  experiences with depression including their impact on functioning and  attempts to resolve it.  60 % 2. Identify and replace thoughts and beliefs that support depression.  50 % 3.Learn and implement behavioral strategies to overcome depression. 50% 4.Verbalize an understanding of healthy and unhealthy emotions with the intent of increasing the use of  healthy emotions to guide actions.  30 %  Target Date:  10/07/2022 Frequency:  Weekly Modality:Individual Interventions by Therapist:  CBT, problem solving, EMDR, insight oriented  Patient approved Treatment Plan  Argie Lober Rejeana Brock, LCSW

## 2022-09-16 ENCOUNTER — Other Ambulatory Visit (HOSPITAL_COMMUNITY): Payer: Self-pay

## 2022-09-16 ENCOUNTER — Other Ambulatory Visit: Payer: Self-pay | Admitting: Adult Health

## 2022-09-16 DIAGNOSIS — F331 Major depressive disorder, recurrent, moderate: Secondary | ICD-10-CM

## 2022-09-16 MED ORDER — REXULTI 0.5 MG PO TABS
0.5000 mg | ORAL_TABLET | Freq: Every day | ORAL | 0 refills | Status: DC
  Filled 2022-09-16: qty 30, 30d supply, fill #0

## 2022-09-19 ENCOUNTER — Other Ambulatory Visit (HOSPITAL_COMMUNITY): Payer: Self-pay

## 2022-09-19 ENCOUNTER — Ambulatory Visit: Payer: BC Managed Care – PPO | Attending: Audiologist | Admitting: Audiologist

## 2022-09-19 DIAGNOSIS — H9193 Unspecified hearing loss, bilateral: Secondary | ICD-10-CM | POA: Diagnosis not present

## 2022-09-19 NOTE — Procedures (Signed)
  Outpatient Audiology and [facility], [place]  [address] [phone]  AUDIOLOGICAL  EVALUATION  NAME: Alexandra Beck     DOB:   1981/01/24      MRN: [id number]                                                                                     DATE: 09/19/2022     REFERENT: Biagio Borg, MD STATUS: Outpatient DIAGNOSIS: Decreased Hearing    History: Alexandra Beck , 41 y.o. , was seen for an audiological evaluation. Alexandra Beck's spouse is concerned for Alexandra Beck's hearing. Alexandra Beck has no significant history of ear infections. There is no family history of pediatric hearing loss. Alexandra Beck denies any pain or pressure in either ear. Medical history negative for any warning signs for hearing loss. 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 Alexandra Beck had her hearing tested with [facility] and was told she might be developing a noise notch. No other relevant case history reported.    Evaluation:  Otoscopy showed a clear view of the tympanic membranes, bilaterally Tympanometry results were consistent with normal middle ear function bilaterally   Audiometric testing was completed using Conventional Audiometry techniques over insert transducer. Test results are consistent with normal hearing 250-8k Hz in both ears. Speech detection thresholds 15dB in the right ear and 15dB in the left ear. Word recognition with Nu6 list was good in both ears at 40dB SL.    Results:  The test results were reviewed with  Alexandra Beck. Hearing is normal in both ears. There is no indication of hearing loss at this time. We discussed at length the role of cognitive load, stress and fatigue on speech understanding. Healthy communication habits in the household were discussed.    Recommendations: 1.   No further audiologic testing is needed unless future hearing concerns arise.    Alfonse Alpers  Audiologist, Au.D., CCC-A

## 2022-09-21 ENCOUNTER — Ambulatory Visit: Payer: BC Managed Care – PPO | Admitting: Psychology

## 2022-09-23 ENCOUNTER — Ambulatory Visit: Payer: BC Managed Care – PPO | Admitting: Internal Medicine

## 2022-09-23 VITALS — BP 126/72 | HR 71 | Temp 98.4°F | Ht 63.0 in | Wt >= 6400 oz

## 2022-09-23 DIAGNOSIS — E669 Obesity, unspecified: Secondary | ICD-10-CM

## 2022-09-23 DIAGNOSIS — R109 Unspecified abdominal pain: Secondary | ICD-10-CM | POA: Insufficient documentation

## 2022-09-23 DIAGNOSIS — E1169 Type 2 diabetes mellitus with other specified complication: Secondary | ICD-10-CM | POA: Diagnosis not present

## 2022-09-23 DIAGNOSIS — R197 Diarrhea, unspecified: Secondary | ICD-10-CM | POA: Diagnosis not present

## 2022-09-23 DIAGNOSIS — I10 Essential (primary) hypertension: Secondary | ICD-10-CM

## 2022-09-23 DIAGNOSIS — R1030 Lower abdominal pain, unspecified: Secondary | ICD-10-CM

## 2022-09-23 LAB — URINALYSIS, ROUTINE W REFLEX MICROSCOPIC
Bilirubin Urine: NEGATIVE
Ketones, ur: NEGATIVE
Nitrite: NEGATIVE
Specific Gravity, Urine: 1.015 (ref 1.000–1.030)
Total Protein, Urine: NEGATIVE
Urine Glucose: NEGATIVE
Urobilinogen, UA: 0.2 (ref 0.0–1.0)
pH: 6 (ref 5.0–8.0)

## 2022-09-23 LAB — CBC WITH DIFFERENTIAL/PLATELET
Basophils Absolute: 0 10*3/uL (ref 0.0–0.1)
Basophils Relative: 0.5 % (ref 0.0–3.0)
Eosinophils Absolute: 0.2 10*3/uL (ref 0.0–0.7)
Eosinophils Relative: 2.4 % (ref 0.0–5.0)
HCT: 37.2 % (ref 36.0–46.0)
Hemoglobin: 11.8 g/dL — ABNORMAL LOW (ref 12.0–15.0)
Lymphocytes Relative: 23.1 % (ref 12.0–46.0)
Lymphs Abs: 1.8 10*3/uL (ref 0.7–4.0)
MCHC: 31.6 g/dL (ref 30.0–36.0)
MCV: 87.6 fl (ref 78.0–100.0)
Monocytes Absolute: 0.6 10*3/uL (ref 0.1–1.0)
Monocytes Relative: 8.1 % (ref 3.0–12.0)
Neutro Abs: 5.2 10*3/uL (ref 1.4–7.7)
Neutrophils Relative %: 65.9 % (ref 43.0–77.0)
Platelets: 260 10*3/uL (ref 150.0–400.0)
RBC: 4.24 Mil/uL (ref 3.87–5.11)
RDW: 18.6 % — ABNORMAL HIGH (ref 11.5–15.5)
WBC: 7.8 10*3/uL (ref 4.0–10.5)

## 2022-09-23 LAB — HEPATIC FUNCTION PANEL
ALT: 14 U/L (ref 0–35)
AST: 12 U/L (ref 0–37)
Albumin: 4 g/dL (ref 3.5–5.2)
Alkaline Phosphatase: 74 U/L (ref 39–117)
Bilirubin, Direct: 0.1 mg/dL (ref 0.0–0.3)
Total Bilirubin: 0.4 mg/dL (ref 0.2–1.2)
Total Protein: 7.4 g/dL (ref 6.0–8.3)

## 2022-09-23 LAB — BASIC METABOLIC PANEL
BUN: 29 mg/dL — ABNORMAL HIGH (ref 6–23)
CO2: 31 mEq/L (ref 19–32)
Calcium: 9.5 mg/dL (ref 8.4–10.5)
Chloride: 98 mEq/L (ref 96–112)
Creatinine, Ser: 1.02 mg/dL (ref 0.40–1.20)
GFR: 67.95 mL/min (ref >60.00)
Glucose, Bld: 108 mg/dL — ABNORMAL HIGH (ref 70–99)
Potassium: 4 mEq/L (ref 3.5–5.1)
Sodium: 137 mEq/L (ref 135–145)

## 2022-09-23 LAB — LIPASE: Lipase: 48 U/L (ref 11.0–59.0)

## 2022-09-23 NOTE — Progress Notes (Unsigned)
Patient ID: Alexandra Beck, female   DOB: October 01, 1981, 41 y.o.   MRN: [id number]        Chief Complaint: follow up diarrhea and lower abd pain       HPI:  Alexandra Beck is a 41 y.o. female here with c/o 3-4 months of persistent but randomly recurring lower abd pain with crampiness and recurring watery diarrhea, worse at the restaurant and has to rush to BR each time, sometimes has accidents.   Pt denies fever, wt loss, night sweats, loss of appetite, or other constitutional symptoms  Denies worsening reflux, dysphagia, n/v, other bowel change or blood.  Pt denies chest pain, increased sob or doe, wheezing, orthopnea, PND, increased LE swelling, palpitations, dizziness or syncope.  Has been going to wellness for weight loss but difficult to lose further wt recently.        Wt Readings from Last 3 Encounters:  09/23/22 (!) 431 lb (195.5 kg)  08/15/22 (!) 430 lb (195 kg)  08/09/22 (!) 433 lb 9.6 oz (196.7 kg)   BP Readings from Last 3 Encounters:  09/23/22 126/72  08/15/22 (!) 126/52  08/09/22 (!) 142/84         Past Medical History:  Diagnosis Date   Acute renal failure (ARF) ([place]) 11/2012    multifactorial-likely secondary to ATN in the setting of sepsis and hypotension, and also from rhabdomyolysis   Anemia    Cellulitis    Chronic diastolic CHF (congestive heart failure) (HCC)    Depression    Diabetes mellitus (HCC)    GERD (gastroesophageal reflux disease)    Hyperlipidemia    Hypertension    Hypothyroidism    Morbid obesity with BMI of 70 and over, adult ([place])    Obesity hypoventilation syndrome (HCC)    OSA (obstructive sleep apnea)    PVC's (premature ventricular contractions)    Recurrent cellulitis of lower leg    LLE, venous insuff   Sepsis ([place]) 11/2012   Secondary to cellulitis   Sleep apnea    Venous insufficiency of leg    Past Surgical History:  Procedure Laterality Date   IR FLUORO GUIDE CV MIDLINE PICC RIGHT  03/28/2017   IR US GUIDE VASC ACCESS RIGHT  03/28/2017    WISDOM TOOTH EXTRACTION      reports that she quit smoking about 12 years ago. Her smoking use included cigarettes. She has a 4.00 pack-year smoking history. She has never used smokeless tobacco. She reports that she does not drink alcohol and does not use drugs. family history includes Diabetes in her mother; Heart disease in her father; High Cholesterol in her father and mother; Hypertension in her father and mother; Obesity in her father and mother; Sleep apnea in her father and mother; Thyroid disease in her mother. Allergies  Allergen Reactions   Aspirin Hives and Swelling    REACTION: throat swelling, hives Other reaction(s): Unknown Other reaction(s): Unknown   Doxycycline Other (See Comments)    Abdominal pain, difficulty swallowing   Lisinopril Cough   Niacin And Related     Other reaction(s): Unknown Other reaction(s): Unknown   Niaspan [Niacin Er]     Caused flushing   Current Outpatient Medications on File Prior to Visit  Medication Sig Dispense Refill   acetaminophen (TYLENOL) 500 MG tablet Take 1,000-2,000 mg by mouth every 6 (six) hours as needed for mild pain.     Brexpiprazole (REXULTI) 0.5 MG TABS Take 1 tablet (0.5 mg total) by mouth daily. [place]  tablet 0   ibuprofen (ADVIL) 200 MG tablet Take 800 mg by mouth every 6 (six) hours as needed for headache or moderate pain.     iron polysaccharides (NU-IRON) 150 MG capsule Take 1 capsule (150 mg total) by mouth daily. 90 capsule 1   levothyroxine (SYNTHROID) 150 MCG tablet Take 1 tablet (150 mcg total) by mouth daily before breakfast. 90 tablet 3   losartan (COZAAR) 100 MG tablet Take 1 tablet (100 mg total) by mouth daily. 90 tablet 3   metFORMIN (GLUCOPHAGE) 500 MG tablet Take 1 tablet (500 mg total) by mouth daily. 90 tablet 3   metoprolol succinate (TOPROL-XL) 50 MG 24 hr tablet Take 1 tablet (50 mg total) by mouth daily. 90 tablet 3   modafinil (PROVIGIL) 100 MG tablet Take '100mg'$  tablet by mouth daily at 9am along with  200 mg tablet daily at 9am (total '300mg'$ ) 60 tablet 3   modafinil (PROVIGIL) 200 MG tablet Take 1 tablet by mouth daily at 9am (along with 100 mg tablet daily at 9am (total '300mg'$ )) 60 tablet 3   rosuvastatin (CRESTOR) 10 MG tablet Take 1 tablet (10 mg total) by mouth daily. 90 tablet 3   Semaglutide, 2 MG/DOSE, 8 MG/3ML SOPN Inject 2 mg into the skin as directed once a week. 3 mL 0   sertraline (ZOLOFT) 100 MG tablet Take 1 tablet by mouth 2 times daily. (Patient taking differently: Take 200 mg by mouth daily.) 180 tablet 3   spironolactone (ALDACTONE) 25 MG tablet Take 1 tablet (25 mg total) by mouth daily. 90 tablet 3   torsemide (DEMADEX) 20 MG tablet Take 4 tablets (80 mg total) by mouth 2 (two) times daily as needed. 720 tablet 3   traMADol (ULTRAM) 50 MG tablet Take 1 tablet (50 mg total) by mouth every 8 (eight) hours as needed. 40 tablet 0   triamcinolone cream (KENALOG) 0.1 % Apply 1 Application topically 2 (two) times daily. 30 g 3   No current facility-administered medications on file prior to visit.        ROS:  All others reviewed and negative.  Objective        PE:  BP 126/72 (BP Location: Left Arm, Patient Position: Sitting, Cuff Size: Large)   Pulse 71   Temp 98.4 F (36.9 C) (Oral)   Ht '5\' 3"'$  (1.6 m)   Wt (!) 431 lb (195.5 kg)   SpO2 95%   BMI 76.35 kg/m                 Constitutional: Pt appears in NAD, supermorbid obese               HENT: Head: NCAT.                Right Ear: External ear normal.                 Left Ear: External ear normal.                Eyes: . Pupils are equal, round, and reactive to light. Conjunctivae and EOM are normal               Nose: without d/c or deformity               Neck: Neck supple. Gross normal ROM               Cardiovascular: Normal rate and regular rhythm.  Pulmonary/Chest: Effort normal and breath sounds without rales or wheezing.                Abd:  Soft, NT, ND, + BS, no organomegaly but limited exam  due to adiposity               Neurological: Pt is alert. At baseline orientation, motor grossly intact               Skin: Skin is warm. No rashes, no other new lesions, LE edema - none               Psychiatric: Pt behavior is normal without agitation   Micro: none  Cardiac tracings I have personally interpreted today:  none  Pertinent Radiological findings (summarize): none   Lab Results  Component Value Date   WBC 7.8 09/23/2022   HGB 11.8 (L) 09/23/2022   HCT 37.2 09/23/2022   PLT 260.0 09/23/2022   GLUCOSE 108 (H) 09/23/2022   CHOL 185 06/17/2022   TRIG 187.0 (H) 06/17/2022   HDL 53.70 06/17/2022   LDLDIRECT 141.0 03/02/2016   LDLCALC 94 06/17/2022   ALT 14 09/23/2022   AST 12 09/23/2022   NA 137 09/23/2022   K 4.0 09/23/2022   CL 98 09/23/2022   CREATININE 1.02 09/23/2022   BUN 29 (H) 09/23/2022   CO2 31 09/23/2022   TSH 4.38 06/17/2022   INR 1.03 03/25/2017   HGBA1C 5.8 (H) 05/13/2022   MICROALBUR 8.05 (H) 06/26/2007   Assessment/Plan:  Alexandra Beck is a 41 y.o. White or Caucasian [1] female with  has a past medical history of Acute renal failure (ARF) ([place]) (11/2012), Anemia, Cellulitis, Chronic diastolic CHF (congestive heart failure) ([place]), Depression, Diabetes mellitus ([place]), GERD (gastroesophageal reflux disease), Hyperlipidemia, Hypertension, Hypothyroidism, Morbid obesity with BMI of 70 and over, adult ([place]), Obesity hypoventilation syndrome ([place]), OSA (obstructive sleep apnea), PVC's (premature ventricular contractions), Recurrent cellulitis of lower leg, Sepsis ([place]) (11/2012), Sleep apnea, and Venous insufficiency of leg.  Abdominal pain Etiology unclear but suspect posisble colitis vs other - for CT abd/pelvis, GI panel, and lab including renal fxn, cbc  Diarrhea Etiology unclear, ongoing x 4 mo intermittent, non toxic today but for lab and GI panel as above, refer GI   Diabetes mellitus type 2 in obese [facility]) Lab Results  Component Value Date    HGBA1C 5.8 (H) 05/13/2022   Stable, pt to continue current medical treatment metformin ER 500 gm qd, ozempic 2 mg weekly    Essential hypertension BP Readings from Last 3 Encounters:  09/23/22 126/72  08/15/22 (!) 126/52  08/09/22 (!) 142/84   Stable, pt to continue medical treatment losartan 100 gm qd, toprol xl 50 mg qd  Followup: Return if symptoms worsen or fail to improve.  Cathlean Cower, MD 09/24/2022 5:28 PM [facility]

## 2022-09-23 NOTE — Patient Instructions (Signed)
Please continue all other medications as before, and refills have been done if requested.  Please have the pharmacy call with any other refills you may need.  Please continue your efforts at being more active, low cholesterol diet  Please keep your appointments with your specialists as you may have planned  You will be contacted regarding the referral for: CT scan, and GI referral  Please go to the LAB at the blood drawing area for the tests to be done  You will be contacted by phone if any changes need to be made immediately.  Otherwise, you will receive a letter about your results with an explanation, but please check with MyChart first.  Please remember to sign up for MyChart if you have not done so, as this will be important to you in the future with finding out test results, communicating by private email, and scheduling acute appointments online when needed.

## 2022-09-24 ENCOUNTER — Encounter: Payer: Self-pay | Admitting: Internal Medicine

## 2022-09-24 DIAGNOSIS — R197 Diarrhea, unspecified: Secondary | ICD-10-CM | POA: Insufficient documentation

## 2022-09-24 NOTE — Assessment & Plan Note (Signed)
BP Readings from Last 3 Encounters:  09/23/22 126/72  08/15/22 (!) 126/52  08/09/22 (!) 142/84   Stable, pt to continue medical treatment losartan 100 gm qd, toprol xl 50 mg qd

## 2022-09-24 NOTE — Assessment & Plan Note (Signed)
Etiology unclear but suspect posisble colitis vs other - for CT abd/pelvis, GI panel, and lab including renal fxn, cbc

## 2022-09-24 NOTE — Assessment & Plan Note (Signed)
Lab Results  Component Value Date   HGBA1C 5.8 (H) 05/13/2022   Stable, pt to continue current medical treatment metformin ER 500 gm qd, ozempic 2 mg weekly

## 2022-09-24 NOTE — Assessment & Plan Note (Addendum)
Etiology unclear, ongoing x 4 mo intermittent, non toxic today but for lab and GI panel as above, refer GI

## 2022-09-25 LAB — URINE CULTURE

## 2022-09-26 ENCOUNTER — Encounter (INDEPENDENT_AMBULATORY_CARE_PROVIDER_SITE_OTHER): Payer: Self-pay | Admitting: Family Medicine

## 2022-09-26 ENCOUNTER — Other Ambulatory Visit (HOSPITAL_COMMUNITY): Payer: Self-pay

## 2022-09-26 ENCOUNTER — Ambulatory Visit (INDEPENDENT_AMBULATORY_CARE_PROVIDER_SITE_OTHER): Payer: BC Managed Care – PPO | Admitting: Family Medicine

## 2022-09-26 VITALS — BP 127/52 | HR 65 | Temp 94.8°F | Ht 63.0 in | Wt >= 6400 oz

## 2022-09-26 DIAGNOSIS — Z7985 Long-term (current) use of injectable non-insulin antidiabetic drugs: Secondary | ICD-10-CM

## 2022-09-26 DIAGNOSIS — D649 Anemia, unspecified: Secondary | ICD-10-CM | POA: Diagnosis not present

## 2022-09-26 DIAGNOSIS — R197 Diarrhea, unspecified: Secondary | ICD-10-CM

## 2022-09-26 DIAGNOSIS — E669 Obesity, unspecified: Secondary | ICD-10-CM

## 2022-09-26 DIAGNOSIS — R600 Localized edema: Secondary | ICD-10-CM

## 2022-09-26 DIAGNOSIS — Z6841 Body Mass Index (BMI) 40.0 and over, adult: Secondary | ICD-10-CM

## 2022-09-26 DIAGNOSIS — E1165 Type 2 diabetes mellitus with hyperglycemia: Secondary | ICD-10-CM | POA: Diagnosis not present

## 2022-09-26 MED ORDER — SEMAGLUTIDE (2 MG/DOSE) 8 MG/3ML ~~LOC~~ SOPN
2.0000 mg | PEN_INJECTOR | SUBCUTANEOUS | 0 refills | Status: DC
  Filled 2022-09-26: qty 3, 28d supply, fill #0

## 2022-09-26 NOTE — Progress Notes (Signed)
Office: [phone]  /  Fax: [phone]   Total lbs lost to date: 9 Total lbs lost since last in-office visit: +2     BP (!) 127/52   Pulse 65   Temp (!) 94.8 F (34.9 C)   Ht '5\' 3"'$  (1.6 m)   Wt (!) 432 lb (196 kg)   SpO2 90%   BMI 76.53 kg/m  She was weighed on the bioimpedance scale:  Body mass index is 76.53 kg/m.  General:  Alert, oriented and cooperative. Patient is in no acute distress.  Mental Status: Normal mood and affect. Normal behavior. Normal judgment and thought content.        Patient past medical history includes:   Past Medical History:  Diagnosis Date   Acute renal failure (ARF) ([place]) 11/2012    multifactorial-likely secondary to ATN in the setting of sepsis and hypotension, and also from rhabdomyolysis   Anemia    Cellulitis    Chronic diastolic CHF (congestive heart failure) (HCC)    Depression    Diabetes mellitus (HCC)    GERD (gastroesophageal reflux disease)    Hyperlipidemia    Hypertension    Hypothyroidism    Morbid obesity with BMI of 70 and over, adult ([place])    Obesity hypoventilation syndrome (HCC)    OSA (obstructive sleep apnea)    PVC's (premature ventricular contractions)    Recurrent cellulitis of lower leg    LLE, venous insuff   Sepsis ([place]) 11/2012   Secondary to cellulitis   Sleep apnea    Venous insufficiency of leg     History of Present Illness The patient presents with a history of obesity, hypertension, diabetes, and vitamin D deficiency. They have been experiencing gastrointestinal issues, which have led to a decreased appetite. The patient describes having urgent diarrhea and has consulted their primary care physician (PCP) regarding these symptoms. The PCP conducted liver function tests, blood work, and urinalysis, all of which returned normal results.  The patient has a history of cellulitis and has experienced it over a dozen times. They report that their leg is currently swollen and puffy, with one leg  appearing significantly larger than the other. However, they have not yet found the right schedule for taking the medication as she cannot take it in the evenings due to frequent urination.  The patient is currently taking Ozempic and metformin for their diabetes management. They have noticed fluctuations in their fluid retention and are concerned about the potential for cellulitis. The patient is also taking iron supplements to address their low hemoglobin levels, which have improved from 10.5 to 11.8. Their goal is to reach a level above 12.  In terms of diet, the patient is trying to manage their calorie and carbohydrate intake, as well as sodium levels. They have stopped taking vitamin D supplements after being advised by their doctor that their levels were within the normal range.   The patient is also focusing on portion control and eating iron-rich foods to address their anemia.  Assessment & Plan Gastrointestinal issues: Patient reports urgent diarrhea for the past couple of months. Blood work and urinalysis were normal. Stool sample and gastroenterology referral pending. -Encouraged patient to complete stool sample and keep gastroenterology appointment. -Advised patient to monitor diet, particularly simple carbohydrates, and observe for any trends related to symptoms.  Diabetes: Possible side effects from Ozempic , which can cause urgent diarrhea. -Advised patient to monitor symptoms and report any changes.  Fluid retention: Patient reports noticeable fluid retention,  particularly in the legs, and has a history of cellulitis. -Advised patient to double up on diuretic as needed, but cautioned against casual use due to potential kidney damage. -Encouraged patient to elevate legs above heart level when possible.  Iron deficiency anemia: Patient is currently taking iron supplements and hemoglobin levels are slowly improving. -Advised patient to continue iron supplements.  I have  personally spent 47 minutes total time today in preparation, patient care, and documentation for this visit, including the following: review of clinical lab tests; review of medical tests/procedures/services.   Follow-up in 3 weeks with Dawn. Schedule next appointment with Dr. Jearld Shines 3 weeks after that.     Dennard Nip, MD

## 2022-09-28 ENCOUNTER — Ambulatory Visit: Payer: BC Managed Care – PPO | Admitting: Psychology

## 2022-09-30 ENCOUNTER — Other Ambulatory Visit (HOSPITAL_COMMUNITY): Payer: Self-pay

## 2022-10-03 ENCOUNTER — Other Ambulatory Visit (HOSPITAL_COMMUNITY): Payer: Self-pay

## 2022-10-05 ENCOUNTER — Ambulatory Visit: Payer: BC Managed Care – PPO | Admitting: Psychology

## 2022-10-07 ENCOUNTER — Telehealth: Payer: Self-pay | Admitting: Pulmonary Disease

## 2022-10-07 ENCOUNTER — Other Ambulatory Visit (HOSPITAL_COMMUNITY): Payer: Self-pay

## 2022-10-07 NOTE — Telephone Encounter (Signed)
Pt called the office stating that her modafinil '200mg'$  was requiring a PA. Pt is currently out of the medication so is trying to see if a rush could be put on this PA. Routing to PA team.

## 2022-10-07 NOTE — Telephone Encounter (Signed)
PA has been submitted through [facility] to [facility] and is awaiting a response.   Key: [id number] - PA Case ID: [id number]

## 2022-10-10 NOTE — Telephone Encounter (Signed)
Attempted to call pt but unable to reach. Left her a detailed message letting her know the status of the PA. Nothing further needed.

## 2022-10-10 NOTE — Telephone Encounter (Signed)
PA for modafinil '200mg'$  has been APPROVED from 10/07/2022-10/08/2023

## 2022-10-12 ENCOUNTER — Ambulatory Visit: Payer: BC Managed Care – PPO | Admitting: Psychology

## 2022-10-12 ENCOUNTER — Other Ambulatory Visit (HOSPITAL_COMMUNITY): Payer: Self-pay

## 2022-10-12 ENCOUNTER — Ambulatory Visit (INDEPENDENT_AMBULATORY_CARE_PROVIDER_SITE_OTHER): Payer: BC Managed Care – PPO | Admitting: Psychology

## 2022-10-12 DIAGNOSIS — F428 Other obsessive-compulsive disorder: Secondary | ICD-10-CM

## 2022-10-12 DIAGNOSIS — F331 Major depressive disorder, recurrent, moderate: Secondary | ICD-10-CM

## 2022-10-12 NOTE — Progress Notes (Addendum)
[facility] Counselor/Therapist Progress Note  Patient ID: ESME DURKIN, MRN: [id number],    Date: 10/12/2022  Time Spent: 60 minutes  Treatment Type: Individual Therapy  Reported Symptoms: sadness,   Mental Status Exam: Appearance:  Casual     Behavior: Appropriate  Motor: Normal  Speech/Language:  Normal Rate  Affect: Appropriate  Mood: anxious  Thought process: normal  Thought content:   WNL  Sensory/Perceptual disturbances:   WNL  Orientation: oriented to person, place, time/date, and situation  Attention: Good  Concentration: Good  Memory: WNL  Fund of knowledge:  Good  Insight:   Good  Judgment:  Good  Impulse Control: Good   Risk Assessment: Danger to Self:  No Self-injurious Behavior: No Danger to Others: No Duty to Warn:no Physical Aggression / Violence:No  Access to Firearms a concern: No  Gang Involvement:No   Subjective: The patient attended a face-to-face individual therapy session today via video visit .  The patient gave verbal consent for the video visit to be on WebEx.  The patient was in her home alone and the therapist was in the office.  The patient presents as pleasant and cooperative.  The patient states that things are going well at her home and she is doing well.  She continues to shower twice a week which is good.  She states that she would like to continue to work on doing more things for her wife and they seem to be getting along much better.  The patient reports that she is a little bit concerned about her relationship with her brother but they are going to see each other next week and hopefully that will get better.  The patient seems to be doing very well and is thinking much more positively than she has previously.  We talked briefly about possibly spreading sessions out even more.  The patient is a little anxious about this and we will continue to process this and talk about it when she is more ready. Interventions: Cognitive  Behavioral Therapy, Assertiveness/Communication, and Problem solving  Diagnosis:Major depressive disorder, recurrent episode, moderate (HCC)  Obsessional thoughts  Plan: Treatment Plan  Strengths/Abilities:  Intelligent, ability for insight, supportive wife  Treatment Preferences:  Outpatient Individual therapy  Statement of Needs:  "I Need help with my depression and motivation. "  Symptoms:Depressed or irritable mood.:(Status: improved). Diminished interest in or  enjoyment of activities.(Status: improved). Feelings of hopelessness,  worthlessness, or inappropriate guilt.: (Status: improved). History of chronic or recurrent depression for which the client has taken antidepressant medication, been hospitalized, had  outpatient treatment, or had a course of electroconvulsive therapy.(Status:  maintained). Lack of energy.: (Status: maintained). Low self-esteem.:  (Status: improved). Poor concentration and indecisiveness.:  (Status: improved). Social withdrawal.:  (Status: maintained).  Unresolved grief issues.: (Status: maintained).  Problems Addressed:  Unipolar Depression  Goals:  LTG:1. Alleviate depressive symptoms and return to previous level of effective  functioning.  60% 2. Appropriately grieve the loss in order to normalize mood and to return  to previously adaptive level of functioning. 3. Develop healthy interpersonal relationships that lead to the alleviation  and help prevent the relapse of depression.  60% 4. Develop healthy thinking patterns and beliefs about self, others, and the world that lead to the alleviation and help prevent the relapse of  Depression  60% STG:1.Describe current and past experiences with depression including their impact on functioning and  attempts to resolve it.  60 % 2. Identify and replace thoughts and  beliefs that support depression.  60 % 3.Learn and implement behavioral strategies to overcome depression. 60% 4.Verbalize an  understanding of healthy and unhealthy emotions with the intent of increasing the use of  healthy emotions to guide actions.  40 %  Target Date:  10/08/2023 Frequency: Bi Weekly Modality:Individual Interventions by Therapist:  CBT, problem solving, EMDR, insight oriented  Patient approved Treatment Plan  Shaniyah Wix Rejeana Brock, LCSW

## 2022-10-13 NOTE — Progress Notes (Signed)
TeleHealth Visit:  This visit was completed with telemedicine (audio/video) technology. Alexandra Beck has verbally consented to this TeleHealth visit. The patient is located at home, the provider is located at home. The participants in this visit include the listed provider and patient. The visit was conducted today via MyChart video.  OBESITY Alexandra Beck is here to discuss her progress with her obesity treatment plan along with follow-up of her obesity related diagnoses.   Today's visit was # 33 Starting weight: 468 lbs Starting date: 04/17/2018 Weight at last in office visit: 432 lbs on 09/26/22 Total weight loss: 36 lbs at last in office visit on 09/26/22. Today's reported weight:  No weight reported.  Nutrition Plan: the Category 4 Plan.   Current exercise:  none  Interim History: Alexandra Beck reports good adherence to the plan.  She and her wife generally have breakfast for their last meal of the day and their largest meal at lunch.  She packs food for work.  Denies intake of sugar sweetened beverages. She and her wife eat out 1-2 times per week. Reports lower extremity edema is improved recently but this generally waxes and wanes.  Assessment/Plan:  1. Type II Diabetes HgbA1c is at goal. Last A1c was 5.8. Medication(s): Ozempic 2 mg weekly, metformin 500 mg daily.  Lab Results  Component Value Date   HGBA1C 5.8 (H) 05/13/2022   HGBA1C 5.9 (H) 11/11/2020   HGBA1C 5.6 12/17/2019   Lab Results  Component Value Date   MICROALBUR 8.05 (H) 06/26/2007   LDLCALC 94 06/17/2022   CREATININE 1.02 09/23/2022    Plan: Refill Ozempic 2 mg weekly. Continue metformin 500 mg daily.   2.  Diarrhea, unspecified type Has had diarrhea for the last 2 to 3 months.  Yesterday she went 3 times which is not abnormal for her but the last was liquid.  She does not feel her diet has changed dramatically over the last 2 to 3 months.  However she did have a loose stool after having a milkshake from  [employer] the other day.  Her PCP ordered a stool sample testing and a CT scan.  She has not been able to get the stool sample yet and has not scheduled the CT. She has been on Ozempic since 2021 and on the 2 mg dose since June 2023.  Before that she was on Mounjaro.  I doubt the Ozempic is causing her diarrhea.  Plan: Obtain stool sample as ordered. Schedule CT scan. Follow-up with PCP. Stay well-hydrated. Avoid simple carbohydrates.  3. Obesity: Current BMI 76.6 Alexandra Beck is currently in the action stage of change. As such, her goal is to continue with weight loss efforts.  She has agreed to the Category 4 Plan.   Exercise goals: No exercise has been prescribed at this time.  Behavioral modification strategies: increasing lean protein intake, decreasing simple carbohydrates, and planning for success.  Alexandra Beck has agreed to follow-up with our clinic in 3 weeks.   No orders of the defined types were placed in this encounter.   Medications Discontinued During This Encounter  Medication Reason   Semaglutide, 2 MG/DOSE, 8 MG/3ML SOPN Reorder     Meds ordered this encounter  Medications   Semaglutide, 2 MG/DOSE, 8 MG/3ML SOPN    Sig: Inject 2 mg into the skin as directed once a week.    Dispense:  3 mL    Refill:  0    Order Specific Question:   Supervising Provider    Answer:   Loyal Gambler  E [[id number]]      Objective:   VITALS: Per patient if applicable, see vitals. GENERAL: Alert and in no acute distress. CARDIOPULMONARY: No increased WOB. Speaking in clear sentences.  PSYCH: Pleasant and cooperative. Speech normal rate and rhythm. Affect is appropriate. Insight and judgement are appropriate. Attention is focused, linear, and appropriate.  NEURO: Oriented as arrived to appointment on time with no prompting.   Lab Results  Component Value Date   CREATININE 1.02 09/23/2022   BUN 29 (H) 09/23/2022   NA 137 09/23/2022   K 4.0 09/23/2022   CL 98 09/23/2022   CO2 31 09/23/2022    Lab Results  Component Value Date   ALT 14 09/23/2022   AST 12 09/23/2022   ALKPHOS 74 09/23/2022   BILITOT 0.4 09/23/2022   Lab Results  Component Value Date   HGBA1C 5.8 (H) 05/13/2022   HGBA1C 5.9 (H) 11/11/2020   HGBA1C 5.6 12/17/2019   HGBA1C 5.6 07/04/2019   HGBA1C 5.4 12/04/2018   Lab Results  Component Value Date   INSULIN 23.6 11/11/2020   INSULIN 17.1 12/17/2019   INSULIN 23.5 07/04/2019   INSULIN 25.8 (H) 12/04/2018   INSULIN 24.7 07/25/2018   Lab Results  Component Value Date   TSH 4.38 06/17/2022   Lab Results  Component Value Date   CHOL 185 06/17/2022   HDL 53.70 06/17/2022   LDLCALC 94 06/17/2022   LDLDIRECT 141.0 03/02/2016   TRIG 187.0 (H) 06/17/2022   CHOLHDL 3 06/17/2022   Lab Results  Component Value Date   WBC 7.8 09/23/2022   HGB 11.8 (L) 09/23/2022   HCT 37.2 09/23/2022   MCV 87.6 09/23/2022   PLT 260.0 09/23/2022   Lab Results  Component Value Date   IRON 34 (L) 06/17/2022   TIBC 527.8 (H) 06/17/2022   FERRITIN 15.4 06/17/2022   Lab Results  Component Value Date   VD25OH 30.2 11/11/2020   VD25OH 38.2 12/17/2019   VD25OH 34.6 07/04/2019    Attestation Statements:   Reviewed by clinician on day of visit: allergies, medications, problem list, medical history, surgical history, family history, social history, and previous encounter notes.

## 2022-10-17 ENCOUNTER — Telehealth (INDEPENDENT_AMBULATORY_CARE_PROVIDER_SITE_OTHER): Payer: BC Managed Care – PPO | Admitting: Family Medicine

## 2022-10-17 ENCOUNTER — Other Ambulatory Visit (HOSPITAL_COMMUNITY): Payer: Self-pay

## 2022-10-17 ENCOUNTER — Encounter (INDEPENDENT_AMBULATORY_CARE_PROVIDER_SITE_OTHER): Payer: Self-pay | Admitting: Family Medicine

## 2022-10-17 DIAGNOSIS — Z6841 Body Mass Index (BMI) 40.0 and over, adult: Secondary | ICD-10-CM | POA: Diagnosis not present

## 2022-10-17 DIAGNOSIS — R197 Diarrhea, unspecified: Secondary | ICD-10-CM

## 2022-10-17 DIAGNOSIS — Z7984 Long term (current) use of oral hypoglycemic drugs: Secondary | ICD-10-CM

## 2022-10-17 DIAGNOSIS — E669 Obesity, unspecified: Secondary | ICD-10-CM | POA: Diagnosis not present

## 2022-10-17 DIAGNOSIS — E1165 Type 2 diabetes mellitus with hyperglycemia: Secondary | ICD-10-CM | POA: Diagnosis not present

## 2022-10-17 DIAGNOSIS — Z7985 Long-term (current) use of injectable non-insulin antidiabetic drugs: Secondary | ICD-10-CM

## 2022-10-17 MED ORDER — SEMAGLUTIDE (2 MG/DOSE) 8 MG/3ML ~~LOC~~ SOPN
2.0000 mg | PEN_INJECTOR | SUBCUTANEOUS | 0 refills | Status: DC
  Filled 2022-10-17 – 2022-10-23 (×2): qty 3, 28d supply, fill #0

## 2022-10-19 ENCOUNTER — Ambulatory Visit: Payer: BC Managed Care – PPO | Admitting: Psychology

## 2022-10-23 ENCOUNTER — Other Ambulatory Visit: Payer: Self-pay | Admitting: Adult Health

## 2022-10-23 ENCOUNTER — Other Ambulatory Visit (HOSPITAL_COMMUNITY): Payer: Self-pay

## 2022-10-23 DIAGNOSIS — F331 Major depressive disorder, recurrent, moderate: Secondary | ICD-10-CM

## 2022-10-24 ENCOUNTER — Other Ambulatory Visit (HOSPITAL_COMMUNITY): Payer: Self-pay

## 2022-10-24 MED ORDER — REXULTI 0.5 MG PO TABS
0.5000 mg | ORAL_TABLET | Freq: Every day | ORAL | 0 refills | Status: DC
  Filled 2022-10-24: qty 30, 30d supply, fill #0

## 2022-10-24 NOTE — Telephone Encounter (Signed)
Please schedule appt

## 2022-10-25 ENCOUNTER — Other Ambulatory Visit (HOSPITAL_COMMUNITY): Payer: Self-pay

## 2022-10-26 ENCOUNTER — Ambulatory Visit: Payer: BC Managed Care – PPO | Admitting: Psychology

## 2022-10-26 ENCOUNTER — Other Ambulatory Visit (HOSPITAL_COMMUNITY): Payer: Self-pay

## 2022-10-26 ENCOUNTER — Ambulatory Visit (INDEPENDENT_AMBULATORY_CARE_PROVIDER_SITE_OTHER): Payer: BC Managed Care – PPO | Admitting: Psychology

## 2022-10-26 DIAGNOSIS — F428 Other obsessive-compulsive disorder: Secondary | ICD-10-CM | POA: Diagnosis not present

## 2022-10-26 DIAGNOSIS — F331 Major depressive disorder, recurrent, moderate: Secondary | ICD-10-CM

## 2022-10-26 NOTE — Progress Notes (Signed)
[facility] Counselor/Therapist Progress Note  Patient ID: Alexandra Beck, MRN: [id number],    Date: 10/26/2022  Time Spent: 60 minutes  Treatment Type: Individual Therapy  Reported Symptoms: sadness,   Mental Status Exam: Appearance:  Casual     Behavior: Appropriate  Motor: Normal  Speech/Language:  Normal Rate  Affect: Appropriate  Mood: pleasant  Thought process: normal  Thought content:   WNL  Sensory/Perceptual disturbances:   WNL  Orientation: oriented to person, place, time/date, and situation  Attention: Good  Concentration: Good  Memory: WNL  Fund of knowledge:  Good  Insight:   Good  Judgment:  Good  Impulse Control: Good   Risk Assessment: Danger to Self:  No Self-injurious Behavior: No Danger to Others: No Duty to Warn:no Physical Aggression / Violence:No  Access to Firearms a concern: No  Gang Involvement:No   Subjective: The patient attended a face-to-face individual therapy session today via video visit .  The patient gave verbal consent for the video visit to be on WebEx.  The patient was in her home alone and the therapist was in the office.  The patient presents as pleasant and cooperative.  The patient reports that Thanksgiving went well.  She states that she was able to hug her brother and felt good about this.  The patient reports that she feels like she is doing okay with handling things.  We did talk today about the relationship that she has with this online gaming person that she has had an attraction to.  We talked about how that relationship seems to be better than it was and that she is being more transparent with her partner even though she still has some feelings for this person.  It is a better situation and the patient does not seem to be as depressed as she was before.  She continues to make progress in functioning with her eating in how she is managing things.  Interventions: Cognitive Behavioral Therapy,  Assertiveness/Communication, and Problem solving  Diagnosis:Major depressive disorder, recurrent episode, moderate (HCC)  Obsessional thoughts  Plan: Treatment Plan  Strengths/Abilities:  Intelligent, ability for insight, supportive wife  Treatment Preferences:  Outpatient Individual therapy  Statement of Needs:  "I Need help with my depression and motivation. "  Symptoms:Depressed or irritable mood.:(Status: improved). Diminished interest in or  enjoyment of activities.(Status: improved). Feelings of hopelessness,  worthlessness, or inappropriate guilt.: (Status: improved). History of chronic or recurrent depression for which the client has taken antidepressant medication, been hospitalized, had  outpatient treatment, or had a course of electroconvulsive therapy.(Status:  maintained). Lack of energy.: (Status: maintained). Low self-esteem.:  (Status: improved). Poor concentration and indecisiveness.:  (Status: improved). Social withdrawal.:  (Status: maintained).  Unresolved grief issues.: (Status: maintained).  Problems Addressed:  Unipolar Depression  Goals:  LTG:1. Alleviate depressive symptoms and return to previous level of effective  functioning.  60% 2. Appropriately grieve the loss in order to normalize mood and to return  to previously adaptive level of functioning. 3. Develop healthy interpersonal relationships that lead to the alleviation  and help prevent the relapse of depression.  60% 4. Develop healthy thinking patterns and beliefs about self, others, and the world that lead to the alleviation and help prevent the relapse of  Depression  60% STG:1.Describe current and past experiences with depression including their impact on functioning and  attempts to resolve it.  60 % 2. Identify and replace thoughts and beliefs that support depression.  60 % 3.Learn and implement  behavioral strategies to overcome depression. 60% 4.Verbalize an understanding of healthy and  unhealthy emotions with the intent of increasing the use of  healthy emotions to guide actions.  40 %  Target Date:  10/08/2023 Frequency: Bi Weekly Modality:Individual Interventions by Therapist:  CBT, problem solving, EMDR, insight oriented  Patient approved Treatment Plan  Alexandra Taira Rejeana Brock, LCSW

## 2022-11-02 ENCOUNTER — Ambulatory Visit: Payer: BC Managed Care – PPO | Admitting: Psychology

## 2022-11-07 ENCOUNTER — Other Ambulatory Visit (HOSPITAL_COMMUNITY): Payer: Self-pay

## 2022-11-07 ENCOUNTER — Encounter (INDEPENDENT_AMBULATORY_CARE_PROVIDER_SITE_OTHER): Payer: Self-pay | Admitting: Family Medicine

## 2022-11-07 ENCOUNTER — Telehealth (INDEPENDENT_AMBULATORY_CARE_PROVIDER_SITE_OTHER): Payer: BC Managed Care – PPO | Admitting: Family Medicine

## 2022-11-07 DIAGNOSIS — E785 Hyperlipidemia, unspecified: Secondary | ICD-10-CM

## 2022-11-07 DIAGNOSIS — Z6841 Body Mass Index (BMI) 40.0 and over, adult: Secondary | ICD-10-CM

## 2022-11-07 DIAGNOSIS — E1165 Type 2 diabetes mellitus with hyperglycemia: Secondary | ICD-10-CM

## 2022-11-07 DIAGNOSIS — E1169 Type 2 diabetes mellitus with other specified complication: Secondary | ICD-10-CM

## 2022-11-07 DIAGNOSIS — E669 Obesity, unspecified: Secondary | ICD-10-CM | POA: Diagnosis not present

## 2022-11-07 DIAGNOSIS — Z7985 Long-term (current) use of injectable non-insulin antidiabetic drugs: Secondary | ICD-10-CM

## 2022-11-07 MED ORDER — SEMAGLUTIDE (2 MG/DOSE) 8 MG/3ML ~~LOC~~ SOPN
2.0000 mg | PEN_INJECTOR | SUBCUTANEOUS | 0 refills | Status: DC
  Filled 2022-11-07 – 2022-11-25 (×2): qty 3, 28d supply, fill #0

## 2022-11-08 ENCOUNTER — Other Ambulatory Visit (HOSPITAL_COMMUNITY): Payer: Self-pay

## 2022-11-08 ENCOUNTER — Other Ambulatory Visit: Payer: Self-pay | Admitting: Primary Care

## 2022-11-08 MED ORDER — MODAFINIL 100 MG PO TABS
100.0000 mg | ORAL_TABLET | Freq: Every day | ORAL | 3 refills | Status: DC
  Filled 2022-11-08 – 2022-11-14 (×2): qty 60, 60d supply, fill #0
  Filled 2023-01-23: qty 60, 60d supply, fill #1
  Filled 2023-03-17 – 2023-04-05 (×2): qty 60, 60d supply, fill #2

## 2022-11-08 NOTE — Telephone Encounter (Signed)
Refill for modafinil has been sent

## 2022-11-09 ENCOUNTER — Ambulatory Visit: Payer: BC Managed Care – PPO | Admitting: Psychology

## 2022-11-09 ENCOUNTER — Ambulatory Visit (INDEPENDENT_AMBULATORY_CARE_PROVIDER_SITE_OTHER): Payer: BC Managed Care – PPO | Admitting: Psychology

## 2022-11-09 ENCOUNTER — Other Ambulatory Visit (HOSPITAL_COMMUNITY): Payer: Self-pay

## 2022-11-09 DIAGNOSIS — F428 Other obsessive-compulsive disorder: Secondary | ICD-10-CM

## 2022-11-09 DIAGNOSIS — F331 Major depressive disorder, recurrent, moderate: Secondary | ICD-10-CM

## 2022-11-10 NOTE — Progress Notes (Signed)
[facility] Counselor/Therapist Progress Note  Patient ID: ARRIAH WADLE, MRN: [id number],    Date: 11/09/2022  Time Spent: 50 minutes  Treatment Type: Individual Therapy  Reported Symptoms: sadness,   Mental Status Exam: Appearance:  Casual     Behavior: Appropriate  Motor: Normal  Speech/Language:  Normal Rate  Affect: Appropriate  Mood: depressedd  Thought process: normal  Thought content:   WNL  Sensory/Perceptual disturbances:   WNL  Orientation: oriented to person, place, time/date, and situation  Attention: Good  Concentration: Good  Memory: WNL  Fund of knowledge:  Good  Insight:   Good  Judgment:  Good  Impulse Control: Good   Risk Assessment: Danger to Self:  No Self-injurious Behavior: No Danger to Others: No Duty to Warn:no Physical Aggression / Violence:No  Access to Firearms a concern: No  Gang Involvement:No   Subjective: The patient attended a face-to-face individual therapy session today via video visit .  The patient gave verbal consent for the video visit to be on WebEx.  The patient was in her home alone and the therapist was in the office.  The patient presents as somewhat depressed.  The patient reports that she just has not been feeling right this month.  We talked about what is going on with her and it seems that she is perceiving herself as a disappointment to her wife and also to her mother.  I did some reframing for the patient and we will continue to monitor this situation.  She is continuing to take her medications like they are prescribed and I wonder how much of this might be related to the holidays.  We will continue to evaluate whether we need to do any more EMDR or how we should navigate forward with this depression. Interventions: Cognitive Behavioral Therapy, Assertiveness/Communication, and Problem solving  Diagnosis:Major depressive disorder, recurrent episode, moderate (HCC)  Obsessional thoughts  Plan: Treatment  Plan  Strengths/Abilities:  Intelligent, ability for insight, supportive wife  Treatment Preferences:  Outpatient Individual therapy  Statement of Needs:  "I Need help with my depression and motivation. "  Symptoms:Depressed or irritable mood.:(Status: improved). Diminished interest in or  enjoyment of activities.(Status: improved). Feelings of hopelessness,  worthlessness, or inappropriate guilt.: (Status: improved). History of chronic or recurrent depression for which the client has taken antidepressant medication, been hospitalized, had  outpatient treatment, or had a course of electroconvulsive therapy.(Status:  maintained). Lack of energy.: (Status: maintained). Low self-esteem.:  (Status: improved). Poor concentration and indecisiveness.:  (Status: improved). Social withdrawal.:  (Status: maintained).  Unresolved grief issues.: (Status: maintained).  Problems Addressed:  Unipolar Depression  Goals:  LTG:1. Alleviate depressive symptoms and return to previous level of effective  functioning.  60% 2. Appropriately grieve the loss in order to normalize mood and to return  to previously adaptive level of functioning. 3. Develop healthy interpersonal relationships that lead to the alleviation  and help prevent the relapse of depression.  60% 4. Develop healthy thinking patterns and beliefs about self, others, and the world that lead to the alleviation and help prevent the relapse of  Depression  60% STG:1.Describe current and past experiences with depression including their impact on functioning and  attempts to resolve it.  60 % 2. Identify and replace thoughts and beliefs that support depression.  60 % 3.Learn and implement behavioral strategies to overcome depression. 60% 4.Verbalize an understanding of healthy and unhealthy emotions with the intent of increasing the use of  healthy emotions to guide actions.  40 %  Target Date:  10/08/2023 Frequency: Bi  Weekly Modality:Individual Interventions by Therapist:  CBT, problem solving, EMDR, insight oriented  Patient approved Treatment Plan  Morgaine Kimball Rejeana Brock, LCSW

## 2022-11-15 ENCOUNTER — Other Ambulatory Visit (HOSPITAL_COMMUNITY): Payer: Self-pay

## 2022-11-15 ENCOUNTER — Other Ambulatory Visit: Payer: Self-pay

## 2022-11-16 ENCOUNTER — Ambulatory Visit: Payer: BC Managed Care – PPO | Admitting: Psychology

## 2022-11-23 ENCOUNTER — Ambulatory Visit (INDEPENDENT_AMBULATORY_CARE_PROVIDER_SITE_OTHER): Payer: BC Managed Care – PPO | Admitting: Psychology

## 2022-11-23 ENCOUNTER — Ambulatory Visit: Payer: BC Managed Care – PPO | Admitting: Psychology

## 2022-11-23 DIAGNOSIS — F331 Major depressive disorder, recurrent, moderate: Secondary | ICD-10-CM

## 2022-11-23 DIAGNOSIS — F428 Other obsessive-compulsive disorder: Secondary | ICD-10-CM

## 2022-11-23 NOTE — Progress Notes (Signed)
[facility] Counselor/Therapist Progress Note  Patient ID: Alexandra Beck, MRN: [id number],    Date: 11/23/2022  Time Spent: 60 minutes  Treatment Type: Individual Therapy  Reported Symptoms: sadness,   Mental Status Exam: Appearance:  Casual     Behavior: Appropriate  Motor: Normal  Speech/Language:  Normal Rate  Affect: Appropriate  Mood: WNL  Thought process: normal  Thought content:   WNL  Sensory/Perceptual disturbances:   WNL  Orientation: oriented to person, place, time/date, and situation  Attention: Good  Concentration: Good  Memory: WNL  Fund of knowledge:  Good  Insight:   Good  Judgment:  Good  Impulse Control: Good   Risk Assessment: Danger to Self:  No Self-injurious Behavior: No Danger to Others: No Duty to Warn:no Physical Aggression / Violence:No  Access to Firearms a concern: No  Gang Involvement:No   Subjective: The patient attended a face-to-face individual therapy session today via video visit .  The patient gave verbal consent for the video visit to be on WebEx.  The patient was in her home alone and the therapist was in the office.  The patient presents as pleasant and cooperative today.  The patient seems to be doing better than she was the last time I spoke with her.  The patient talked about working things out with her mother and realizes that she and her wife are doing much better since she has been in therapy.  The patient is working on trying to be more positive and not being so down on herself much of the time.  We talked about ways to reframe her thoughts and interact in a better way with her partner.  The patient seems to be doing better as far as acknowledging her worth. Interventions: Cognitive Behavioral Therapy, Assertiveness/Communication, and Problem solving  Diagnosis:Major depressive disorder, recurrent episode, moderate (HCC)  Obsessional thoughts  Plan: Treatment Plan  Strengths/Abilities:  Intelligent, ability  for insight, supportive wife  Treatment Preferences:  Outpatient Individual therapy  Statement of Needs:  "I Need help with my depression and motivation. "  Symptoms:Depressed or irritable mood.:(Status: improved). Diminished interest in or  enjoyment of activities.(Status: improved). Feelings of hopelessness,  worthlessness, or inappropriate guilt.: (Status: improved). History of chronic or recurrent depression for which the client has taken antidepressant medication, been hospitalized, had  outpatient treatment, or had a course of electroconvulsive therapy.(Status:  maintained). Lack of energy.: (Status: maintained). Low self-esteem.:  (Status: improved). Poor concentration and indecisiveness.:  (Status: improved). Social withdrawal.:  (Status: maintained).  Unresolved grief issues.: (Status: maintained).  Problems Addressed:  Unipolar Depression  Goals:  LTG:1. Alleviate depressive symptoms and return to previous level of effective  functioning.  60% 2. Appropriately grieve the loss in order to normalize mood and to return  to previously adaptive level of functioning. 3. Develop healthy interpersonal relationships that lead to the alleviation  and help prevent the relapse of depression.  60% 4. Develop healthy thinking patterns and beliefs about self, others, and the world that lead to the alleviation and help prevent the relapse of  Depression  60% STG:1.Describe current and past experiences with depression including their impact on functioning and  attempts to resolve it.  60 % 2. Identify and replace thoughts and beliefs that support depression.  60 % 3.Learn and implement behavioral strategies to overcome depression. 60% 4.Verbalize an understanding of healthy and unhealthy emotions with the intent of increasing the use of  healthy emotions to guide actions.  40 %  Target Date:  10/08/2023 Frequency: Bi Weekly Modality:Individual Interventions by Therapist:  CBT, problem  solving, EMDR, insight oriented  Patient approved Treatment Plan  [name] G Renton Berkley, LCSW

## 2022-11-25 ENCOUNTER — Other Ambulatory Visit: Payer: Self-pay | Admitting: Pulmonary Disease

## 2022-11-25 ENCOUNTER — Other Ambulatory Visit (HOSPITAL_COMMUNITY): Payer: Self-pay

## 2022-11-25 ENCOUNTER — Other Ambulatory Visit: Payer: Self-pay | Admitting: Adult Health

## 2022-11-25 DIAGNOSIS — F331 Major depressive disorder, recurrent, moderate: Secondary | ICD-10-CM

## 2022-11-25 NOTE — Progress Notes (Signed)
TeleHealth Visit:  Due to the COVID-19 pandemic, this visit was completed with telemedicine (audio/video) technology to reduce patient and provider exposure as well as to preserve personal protective equipment.   Alexandra Beck has verbally consented to this TeleHealth visit. The patient is located at home, the provider is located at the [facility] office. The participants in this visit include the listed provider and patient. The visit was conducted today via video.   Chief Complaint: OBESITY Alexandra Beck is here to discuss her progress with her obesity treatment plan along with follow-up of her obesity related diagnoses. Alexandra Beck is on the Category 4 Plan and states she is following her eating plan approximately 60% of the time. Alexandra Beck states she is not currently exercising.  Today's visit was #: 61 Starting weight: 468 lbs Starting date: 04/17/2018  Interim History: Pt has been feeling more down mentally since Thanksgiving. She wonders whether she is retaining fluid. She sees a therapist every other week. Pt anticipates it being just her and her wife for Christmas. Wife is working for [employer].  Subjective:   1. Type 2 diabetes mellitus with hyperglycemia, without long-term current use of insulin (Alexandra Beck) Alexandra Beck's last A1c was in June 2023. She denies GI side effects of Ozempic.  2. Hyperlipidemia associated with type 2 diabetes mellitus (Alexandra Beck) Alexandra Beck's last LDL was 94, HDL 53, and triglycerides 187. She is on Crestor with no side effects.  Assessment/Plan:   1. Type 2 diabetes mellitus with hyperglycemia, without long-term current use of insulin (Alexandra Beck) Good blood sugar control is important to decrease the likelihood of diabetic complications such as nephropathy, neuropathy, limb loss, blindness, coronary artery disease, and death. Intensive lifestyle modification including diet, exercise and weight loss are the first line of treatment for diabetes.   Refill- Semaglutide, 2 MG/DOSE, 8  MG/3ML SOPN; Inject 2 mg into the skin as directed once a week.  Dispense: 3 mL; Refill: 0  2. Hyperlipidemia associated with type 2 diabetes mellitus (Alexandra Beck) Cardiovascular risk and specific lipid/LDL goals reviewed.  We discussed several lifestyle modifications today and Alexandra Beck will continue to work on diet, exercise and weight loss efforts. Orders and follow up as documented in patient record.  Repeat labs at next in person appt.  Counseling Intensive lifestyle modifications are the first line treatment for this issue. Dietary changes: Increase soluble fiber. Decrease simple carbohydrates. Exercise changes: Moderate to vigorous-intensity aerobic activity 150 minutes per week if tolerated. Lipid-lowering medications: see documented in medical record.  3. Obesity: Current BMI 76.6 Alexandra Beck is currently in the action stage of change. As such, her goal is to continue with weight loss efforts. She has agreed to the Category 4 Plan.   Exercise goals: No exercise has been prescribed at this time.  Behavioral modification strategies: increasing lean protein intake, meal planning and cooking strategies, keeping healthy foods in the home, and holiday eating strategies .  Alexandra Beck has agreed to follow-up with our clinic in 4 weeks. She was informed of the importance of frequent follow-up visits to maximize her success with intensive lifestyle modifications for her multiple health conditions.  Objective:   VITALS: Per patient if applicable, see vitals. GENERAL: Alert and in no acute distress. CARDIOPULMONARY: No increased WOB. Speaking in clear sentences.  PSYCH: Pleasant and cooperative. Speech normal rate and rhythm. Affect is appropriate. Insight and judgement are appropriate. Attention is focused, linear, and appropriate.  NEURO: Oriented as arrived to appointment on time with no prompting.   Lab Results  Component Value Date   CREATININE 1.02 09/23/2022   BUN 29 (H) 09/23/2022   NA 137 09/23/2022    K 4.0 09/23/2022   CL 98 09/23/2022   CO2 31 09/23/2022   Lab Results  Component Value Date   ALT 14 09/23/2022   AST 12 09/23/2022   ALKPHOS 74 09/23/2022   BILITOT 0.4 09/23/2022   Lab Results  Component Value Date   HGBA1C 5.8 (H) 05/13/2022   HGBA1C 5.9 (H) 11/11/2020   HGBA1C 5.6 12/17/2019   HGBA1C 5.6 07/04/2019   HGBA1C 5.4 12/04/2018   Lab Results  Component Value Date   INSULIN 23.6 11/11/2020   INSULIN 17.1 12/17/2019   INSULIN 23.5 07/04/2019   INSULIN 25.8 (H) 12/04/2018   INSULIN 24.7 07/25/2018   Lab Results  Component Value Date   TSH 4.38 06/17/2022   Lab Results  Component Value Date   CHOL 185 06/17/2022   HDL 53.70 06/17/2022   LDLCALC 94 06/17/2022   LDLDIRECT 141.0 03/02/2016   TRIG 187.0 (H) 06/17/2022   CHOLHDL 3 06/17/2022   Lab Results  Component Value Date   VD25OH 30.2 11/11/2020   VD25OH 38.2 12/17/2019   VD25OH 34.6 07/04/2019   Lab Results  Component Value Date   WBC 7.8 09/23/2022   HGB 11.8 (L) 09/23/2022   HCT 37.2 09/23/2022   MCV 87.6 09/23/2022   PLT 260.0 09/23/2022   Lab Results  Component Value Date   IRON 34 (L) 06/17/2022   TIBC 527.8 (H) 06/17/2022   FERRITIN 15.4 06/17/2022    Attestation Statements:   Reviewed by clinician on day of visit: allergies, medications, problem list, medical history, surgical history, family history, social history, and previous encounter notes.  I, Kathlene November, BS, CMA, am acting as transcriptionist for Coralie Common, MD.   I have reviewed the above documentation for accuracy and completeness, and I agree with the above. - Coralie Common, MD

## 2022-11-26 ENCOUNTER — Other Ambulatory Visit (HOSPITAL_COMMUNITY): Payer: Self-pay

## 2022-11-29 ENCOUNTER — Encounter (HOSPITAL_BASED_OUTPATIENT_CLINIC_OR_DEPARTMENT_OTHER): Payer: Self-pay | Admitting: Pulmonary Disease

## 2022-11-29 ENCOUNTER — Other Ambulatory Visit: Payer: Self-pay

## 2022-11-29 MED ORDER — REXULTI 0.5 MG PO TABS
0.5000 mg | ORAL_TABLET | Freq: Every day | ORAL | 0 refills | Status: DC
  Filled 2022-11-29: qty 30, 30d supply, fill #0

## 2022-11-29 NOTE — Telephone Encounter (Signed)
Pt called at 4:43p. She would like refill of Rexulti sent to [facility].  Next appt 1/19

## 2022-11-29 NOTE — Telephone Encounter (Signed)
Please call to schedule an appt, last seen in August

## 2022-11-29 NOTE — Telephone Encounter (Signed)
Please advise on refill request

## 2022-11-30 ENCOUNTER — Other Ambulatory Visit (HOSPITAL_COMMUNITY): Payer: Self-pay

## 2022-11-30 ENCOUNTER — Other Ambulatory Visit: Payer: Self-pay

## 2022-12-01 NOTE — Progress Notes (Deleted)
TeleHealth Visit:  This visit was completed with telemedicine (audio/video) technology. Alexandra Beck has verbally consented to this TeleHealth visit. The patient is located at home, the provider is located at home. The participants in this visit include the listed provider and patient. The visit was conducted today via MyChart video.  OBESITY Alexandra Beck is here to discuss her progress with her obesity treatment plan along with follow-up of her obesity related diagnoses.   Today's visit was # 31 Starting weight: 468 lbs Starting date: 04/17/2018 Weight at last in office visit: 432 lbs on 09/26/22 Total weight loss: 36 lbs at last in office visit on 09/26/22. Today's reported weight: *** lbs No weight reported.  Nutrition Plan: the Category 4 Plan.   Current exercise: {exercise types:16438} none  Interim History:  ***  Assessment/Plan:  1. ***  2. ***  3. ***  Obesity: Current BMI *** Alexandra Beck {CHL AMB IS/IS NOT:210130109} currently in the action stage of change. As such, her goal is to {MWMwtloss#1:210800005}.  She has agreed to {MWMwtlossportion/plan2:23431}.   Exercise goals: {MWM EXERCISE RECS:23473}  Behavioral modification strategies: {MWMwtlossdietstrategies3:23432}.  Alexandra Beck has agreed to follow-up with our clinic in {NUMBER 1-10:22536} weeks.   No orders of the defined types were placed in this encounter.   There are no discontinued medications.   No orders of the defined types were placed in this encounter.     Objective:   VITALS: Per patient if applicable, see vitals. GENERAL: Alert and in no acute distress. CARDIOPULMONARY: No increased WOB. Speaking in clear sentences.  PSYCH: Pleasant and cooperative. Speech normal rate and rhythm. Affect is appropriate. Insight and judgement are appropriate. Attention is focused, linear, and appropriate.  NEURO: Oriented as arrived to appointment on time with no prompting.   Lab Results  Component Value Date   CREATININE  1.02 09/23/2022   BUN 29 (H) 09/23/2022   NA 137 09/23/2022   K 4.0 09/23/2022   CL 98 09/23/2022   CO2 31 09/23/2022   Lab Results  Component Value Date   ALT 14 09/23/2022   AST 12 09/23/2022   ALKPHOS 74 09/23/2022   BILITOT 0.4 09/23/2022   Lab Results  Component Value Date   HGBA1C 5.8 (H) 05/13/2022   HGBA1C 5.9 (H) 11/11/2020   HGBA1C 5.6 12/17/2019   HGBA1C 5.6 07/04/2019   HGBA1C 5.4 12/04/2018   Lab Results  Component Value Date   INSULIN 23.6 11/11/2020   INSULIN 17.1 12/17/2019   INSULIN 23.5 07/04/2019   INSULIN 25.8 (H) 12/04/2018   INSULIN 24.7 07/25/2018   Lab Results  Component Value Date   TSH 4.38 06/17/2022   Lab Results  Component Value Date   CHOL 185 06/17/2022   HDL 53.70 06/17/2022   LDLCALC 94 06/17/2022   LDLDIRECT 141.0 03/02/2016   TRIG 187.0 (H) 06/17/2022   CHOLHDL 3 06/17/2022   Lab Results  Component Value Date   WBC 7.8 09/23/2022   HGB 11.8 (L) 09/23/2022   HCT 37.2 09/23/2022   MCV 87.6 09/23/2022   PLT 260.0 09/23/2022   Lab Results  Component Value Date   IRON 34 (L) 06/17/2022   TIBC 527.8 (H) 06/17/2022   FERRITIN 15.4 06/17/2022   Lab Results  Component Value Date   VD25OH 30.2 11/11/2020   VD25OH 38.2 12/17/2019   VD25OH 34.6 07/04/2019    Attestation Statements:   Reviewed by clinician on day of visit: allergies, medications, problem list, medical history, surgical history, family history, social history, and previous encounter notes.  ***(  delete if time-based billing not used) Time spent on visit including the items listed below was *** minutes.  -preparing to see the patient (e.g., review of tests, history, previous notes) -obtaining and/or reviewing separately obtained history -counseling and educating the patient/family/caregiver -documenting clinical information in the electronic or other health record -ordering medications, tests, or procedures -independently interpreting results and  communicating results to the patient/ family/caregiver -referring and communicating with other health care professionals  -care coordination

## 2022-12-02 ENCOUNTER — Other Ambulatory Visit: Payer: Self-pay

## 2022-12-02 MED ORDER — MODAFINIL 200 MG PO TABS
ORAL_TABLET | ORAL | 3 refills | Status: DC
  Filled 2022-12-02: qty 60, fill #0
  Filled 2022-12-06 – 2022-12-20 (×2): qty 60, 60d supply, fill #0
  Filled 2023-02-15: qty 60, 60d supply, fill #1
  Filled 2023-04-12 – 2023-04-29 (×2): qty 60, 60d supply, fill #2

## 2022-12-02 NOTE — Telephone Encounter (Signed)
Refill sent.

## 2022-12-05 ENCOUNTER — Encounter (INDEPENDENT_AMBULATORY_CARE_PROVIDER_SITE_OTHER): Payer: Self-pay | Admitting: Family Medicine

## 2022-12-05 ENCOUNTER — Other Ambulatory Visit (HOSPITAL_COMMUNITY): Payer: Self-pay

## 2022-12-05 ENCOUNTER — Telehealth (INDEPENDENT_AMBULATORY_CARE_PROVIDER_SITE_OTHER): Payer: BC Managed Care – PPO | Admitting: Family Medicine

## 2022-12-06 ENCOUNTER — Other Ambulatory Visit (HOSPITAL_COMMUNITY): Payer: Self-pay

## 2022-12-06 ENCOUNTER — Other Ambulatory Visit: Payer: Self-pay

## 2022-12-07 ENCOUNTER — Ambulatory Visit (INDEPENDENT_AMBULATORY_CARE_PROVIDER_SITE_OTHER): Payer: BC Managed Care – PPO | Admitting: Psychology

## 2022-12-07 ENCOUNTER — Other Ambulatory Visit (HOSPITAL_COMMUNITY): Payer: Self-pay

## 2022-12-07 ENCOUNTER — Encounter (HOSPITAL_COMMUNITY): Payer: Self-pay

## 2022-12-07 DIAGNOSIS — F428 Other obsessive-compulsive disorder: Secondary | ICD-10-CM

## 2022-12-07 DIAGNOSIS — F331 Major depressive disorder, recurrent, moderate: Secondary | ICD-10-CM

## 2022-12-07 NOTE — Progress Notes (Signed)
[facility] Counselor/Therapist Progress Note  Patient ID: Alexandra Beck, MRN: [id number],    Date: 12/07/2022  Time Spent: 60 minutes  Treatment Type: Individual Therapy  Reported Symptoms: sadness,   Mental Status Exam: Appearance:  Casual     Behavior: Appropriate  Motor: Normal  Speech/Language:  Normal Rate  Affect: Appropriate  Mood: WNL  Thought process: normal  Thought content:   WNL  Sensory/Perceptual disturbances:   WNL  Orientation: oriented to person, place, time/date, and situation  Attention: Good  Concentration: Good  Memory: WNL  Fund of knowledge:  Good  Insight:   Good  Judgment:  Good  Impulse Control: Good   Risk Assessment: Danger to Self:  No Self-injurious Behavior: No Danger to Others: No Duty to Warn:no Physical Aggression / Violence:No  Access to Firearms a concern: No  Gang Involvement:No   Subjective: The patient attended a face-to-face individual therapy session today via video visit .  The patient gave verbal consent for the video visit to be on WebEx.  The patient was in her home alone and the therapist was in the office.  The patient presents as a little sad today.  The patient reports that she has not been feeling as good as she was feeling and she is going to talk with her medication provider when she goes to see her next week.  The patient talked about not being able to watch the news and I encouraged her not to do that so much because of the fact that she cannot do anything about the majority of the things that are going on.  The patient does seem a little more down and this may be just a chemical imbalance.  She reports that she is getting along fine with her partner and that both of them have been sick over the last 2 weeks so that that may be impacting how they are feeling emotionally.  Interventions: Cognitive Behavioral Therapy, Assertiveness/Communication, and Problem solving  Diagnosis:Major depressive disorder,  recurrent episode, moderate (HCC)  Obsessional thoughts  Plan: Treatment Plan  Strengths/Abilities:  Intelligent, ability for insight, supportive wife  Treatment Preferences:  Outpatient Individual therapy  Statement of Needs:  "I Need help with my depression and motivation. "  Symptoms:Depressed or irritable mood.:(Status: improved). Diminished interest in or  enjoyment of activities.(Status: improved). Feelings of hopelessness,  worthlessness, or inappropriate guilt.: (Status: improved). History of chronic or recurrent depression for which the client has taken antidepressant medication, been hospitalized, had  outpatient treatment, or had a course of electroconvulsive therapy.(Status:  maintained). Lack of energy.: (Status: maintained). Low self-esteem.:  (Status: improved). Poor concentration and indecisiveness.:  (Status: improved). Social withdrawal.:  (Status: maintained).  Unresolved grief issues.: (Status: maintained).  Problems Addressed:  Unipolar Depression  Goals:  LTG:1. Alleviate depressive symptoms and return to previous level of effective  functioning.  60% 2. Appropriately grieve the loss in order to normalize mood and to return  to previously adaptive level of functioning. 3. Develop healthy interpersonal relationships that lead to the alleviation  and help prevent the relapse of depression.  60% 4. Develop healthy thinking patterns and beliefs about self, others, and the world that lead to the alleviation and help prevent the relapse of  Depression  60% STG:1.Describe current and past experiences with depression including their impact on functioning and  attempts to resolve it.  60 % 2. Identify and replace thoughts and beliefs that support depression.  60 % 3.Learn and implement behavioral strategies to overcome depression.  60% 4.Verbalize an understanding of healthy and unhealthy emotions with the intent of increasing the use of  healthy emotions to guide  actions.  40 %  Target Date:  10/08/2023 Frequency: Bi Weekly Modality:Individual Interventions by Therapist:  CBT, problem solving, EMDR, insight oriented  Patient approved Treatment Plan  Latavia Goga Rejeana Brock, LCSW

## 2022-12-12 ENCOUNTER — Other Ambulatory Visit: Payer: Self-pay

## 2022-12-16 ENCOUNTER — Other Ambulatory Visit (HOSPITAL_COMMUNITY): Payer: Self-pay

## 2022-12-16 ENCOUNTER — Telehealth (INDEPENDENT_AMBULATORY_CARE_PROVIDER_SITE_OTHER): Payer: BC Managed Care – PPO | Admitting: Adult Health

## 2022-12-16 ENCOUNTER — Other Ambulatory Visit: Payer: Self-pay

## 2022-12-16 ENCOUNTER — Encounter: Payer: Self-pay | Admitting: Adult Health

## 2022-12-16 DIAGNOSIS — G4700 Insomnia, unspecified: Secondary | ICD-10-CM

## 2022-12-16 DIAGNOSIS — F428 Other obsessive-compulsive disorder: Secondary | ICD-10-CM

## 2022-12-16 DIAGNOSIS — F331 Major depressive disorder, recurrent, moderate: Secondary | ICD-10-CM

## 2022-12-16 MED ORDER — SERTRALINE HCL 100 MG PO TABS
200.0000 mg | ORAL_TABLET | Freq: Every day | ORAL | 3 refills | Status: DC
  Filled 2022-12-16 – 2023-01-23 (×2): qty 180, 90d supply, fill #0
  Filled 2023-04-29: qty 180, 90d supply, fill #1
  Filled 2023-07-28: qty 180, 90d supply, fill #2
  Filled 2023-10-23: qty 180, 90d supply, fill #3

## 2022-12-16 MED ORDER — BREXPIPRAZOLE 1 MG PO TABS
1.0000 mg | ORAL_TABLET | Freq: Every day | ORAL | 2 refills | Status: DC
  Filled 2022-12-16: qty 30, 30d supply, fill #0
  Filled 2023-01-23: qty 30, 30d supply, fill #1
  Filled 2023-02-15: qty 30, 30d supply, fill #2

## 2022-12-16 NOTE — Progress Notes (Signed)
Alexandra Beck [id number] [birth year] 42 y.o.  Virtual Visit via Video Note  I connected with pt @ on 12/16/22 at 12:00 PM EST by a video enabled telemedicine application and verified that I am speaking with the correct person using two identifiers.   I discussed the limitations of evaluation and management by telemedicine and the availability of in person appointments. The patient expressed understanding and agreed to proceed.  I discussed the assessment and treatment plan with the patient. The patient was provided an opportunity to ask questions and all were answered. The patient agreed with the plan and demonstrated an understanding of the instructions.   The patient was advised to call back or seek an in-person evaluation if the symptoms worsen or if the condition fails to improve as anticipated.  I provided 20 minutes of non-face-to-face time during this encounter.  The patient was located at home.  The provider was located at [place].   Aloha Gell, NP   Subjective:   Patient ID:  Alexandra Beck is a 42 y.o. (DOB Jan 03, 1981) female.  Chief Complaint: No chief complaint on file.   HPI [name] presents for follow-up of insomnia, obsessional thoughts and depression.  Describes mood today as "better". Pleasant. Decreased tearfulness. Mood symptoms - reports depression, anxiety, and irritability. Reports worry and rumination. Denies obsessive thoughts or acts. Mood is lower. Stating "I'm not doing too good". Feels like the addition of Rexulti was helpful initially, but may need to increase dose. Seeing therapist - Bambi Cottle for therapy. Varying interest and motivation. Taking medications as prescribed. Energy levels lower. Active, does not have a regular exercise routine.  Enjoys some usual interests and activities. Married. Lives with wife and 3 cats. Spending time with family. Gaming online with friends. Appetite adequate not much of an appetite. Taking  Ozempic. Weight loss. Working with a weight management clinic.  Sleeping well most nights. Averages 7 to 8 hours. Focus and concentration stable. Completing tasks. Managing minimal aspects of household. Works 12 to 9 - at [employer].  Denies SI or HI.  Denies AH or VH. Denies self harm. Denies substance use. Seeing Bambi Cottle for therapy.  Previous medication trials: Trialed on Wellbutrin at age 29, Trintellix-weird dreams, [place]  Review of Systems:  Review of Systems  Musculoskeletal:  Negative for gait problem.  Neurological:  Negative for tremors.  Psychiatric/Behavioral:         Please refer to HPI    Medications: I have reviewed the patient's current medications.  Current Outpatient Medications  Medication Sig Dispense Refill   brexpiprazole (REXULTI) 1 MG TABS tablet Take 1 tablet (1 mg total) by mouth daily. 30 tablet 2   acetaminophen (TYLENOL) 500 MG tablet Take 1,000-2,000 mg by mouth every 6 (six) hours as needed for mild pain.     ibuprofen (ADVIL) 200 MG tablet Take 800 mg by mouth every 6 (six) hours as needed for headache or moderate pain.     iron polysaccharides (NU-IRON) 150 MG capsule Take 1 capsule (150 mg total) by mouth daily. 90 capsule 1   levothyroxine (SYNTHROID) 150 MCG tablet Take 1 tablet (150 mcg total) by mouth daily before breakfast. 90 tablet 3   losartan (COZAAR) 100 MG tablet Take 1 tablet (100 mg total) by mouth daily. 90 tablet 3   metFORMIN (GLUCOPHAGE) 500 MG tablet Take 1 tablet (500 mg total) by mouth daily. 90 tablet 3   metoprolol succinate (TOPROL-XL) 50 MG 24 hr tablet  Take 1 tablet (50 mg total) by mouth daily. 90 tablet 3   modafinil (PROVIGIL) 100 MG tablet Take '100mg'$  tablet by mouth daily at 9am (along with 200 mg tablet daily at 9am for a total of 300 mg daily) 60 tablet 3   modafinil (PROVIGIL) 200 MG tablet Take 1 tablet by mouth daily at 9am (along with 100 mg tablet daily at 9am (total '300mg'$ )) 60 tablet 3   rosuvastatin  (CRESTOR) 10 MG tablet Take 1 tablet (10 mg total) by mouth daily. 90 tablet 3   Semaglutide, 2 MG/DOSE, 8 MG/3ML SOPN Inject 2 mg into the skin as directed once a week. 3 mL 0   sertraline (ZOLOFT) 100 MG tablet Take 2 tablets (200 mg total) by mouth daily. 90 tablet 3   spironolactone (ALDACTONE) 25 MG tablet Take 1 tablet (25 mg total) by mouth daily. 90 tablet 3   torsemide (DEMADEX) 20 MG tablet Take 4 tablets (80 mg total) by mouth 2 (two) times daily as needed. 720 tablet 3   traMADol (ULTRAM) 50 MG tablet Take 1 tablet (50 mg total) by mouth every 8 (eight) hours as needed. 40 tablet 0   triamcinolone cream (KENALOG) 0.1 % Apply 1 Application topically 2 (two) times daily. 30 g 3   No current facility-administered medications for this visit.    Medication Side Effects: None  Allergies:  Allergies  Allergen Reactions   Aspirin Hives and Swelling    REACTION: throat swelling, hives Other reaction(s): Unknown Other reaction(s): Unknown   Doxycycline Other (See Comments)    Abdominal pain, difficulty swallowing   Lisinopril Cough   Niacin And Related     Other reaction(s): Unknown Other reaction(s): Unknown   Niaspan [Niacin Er]     Caused flushing    Past Medical History:  Diagnosis Date   Acute renal failure (ARF) ([place]) 11/2012    multifactorial-likely secondary to ATN in the setting of sepsis and hypotension, and also from rhabdomyolysis   Anemia    Cellulitis    Chronic diastolic CHF (congestive heart failure) (HCC)    Depression    Diabetes mellitus (HCC)    GERD (gastroesophageal reflux disease)    Hyperlipidemia    Hypertension    Hypothyroidism    Morbid obesity with BMI of 70 and over, adult ([place])    Obesity hypoventilation syndrome (HCC)    OSA (obstructive sleep apnea)    PVC's (premature ventricular contractions)    Recurrent cellulitis of lower leg    LLE, venous insuff   Sepsis ([place]) 11/2012   Secondary to cellulitis   Sleep apnea    Venous  insufficiency of leg     Family History  Problem Relation Age of Onset   Hypertension Mother    Diabetes Mother    High Cholesterol Mother    Thyroid disease Mother    Sleep apnea Mother    Obesity Mother    Hypertension Father    Heart disease Father        before age 38   High Cholesterol Father    Sleep apnea Father    Obesity Father     Social History   Socioeconomic History   Marital status: Single    Spouse name: Not on file   Number of children: 0   Years of education: Not on file   Highest education level: Not on file  Occupational History   Occupation: Librarian    Employer: [employer]  Tobacco Use   Smoking  status: Former    Packs/day: 0.50    Years: 8.00    Total pack years: 4.00    Types: Cigarettes    Quit date: 2011    Years since quitting: 13.0   Smokeless tobacco: Never  Vaping Use   Vaping Use: Never used  Substance and Sexual Activity   Alcohol use: No    Alcohol/week: 0.0 standard drinks of alcohol   Drug use: No   Sexual activity: Not on file  Other Topics Concern   Not on file  Social History Narrative   Librarian, 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   Please see review of systems for further details on the patient's review from today.   Objective:   Physical Exam:  There were no vitals taken for this visit.  Physical Exam Constitutional:      General: She is not in acute distress. Musculoskeletal:        General: No deformity.  Neurological:     Mental Status: She is alert and oriented to person, place, and time.      Coordination: Coordination normal.  Psychiatric:        Attention and Perception: Attention and perception normal. She does not perceive auditory or visual hallucinations.        Mood and Affect: Mood normal. Mood is not anxious or depressed. Affect is not labile, blunt, angry or inappropriate.        Speech: Speech normal.        Behavior: Behavior normal.        Thought Content: Thought content normal. Thought content is not paranoid or delusional. Thought content does not include homicidal or suicidal ideation. Thought content does not include homicidal or suicidal plan.        Cognition and Memory: Cognition and memory normal.        Judgment: Judgment normal.     Comments: Insight intact     Lab Review:     Component Value Date/Time   NA 137 09/23/2022 1134   NA 141 09/14/2021 1331   K 4.0 09/23/2022 1134   CL 98 09/23/2022 1134   CO2 31 09/23/2022 1134   GLUCOSE 108 (H) 09/23/2022 1134   BUN 29 (H) 09/23/2022 1134   BUN 26 (H) 09/14/2021 1331   CREATININE 1.02 09/23/2022 1134   CREATININE 1.06 11/14/2016 1050   CALCIUM 9.5 09/23/2022 1134   CALCIUM 9.6 06/26/2007 0000   PROT 7.4 09/23/2022 1134   PROT 7.2 09/14/2021 1331   ALBUMIN 4.0 09/23/2022 1134   ALBUMIN 4.1 09/14/2021 1331   AST 12 09/23/2022 1134   ALT 14 09/23/2022 1134   ALKPHOS 74 09/23/2022 1134   BILITOT 0.4 09/23/2022 1134   BILITOT 0.2 09/14/2021 1331   GFRNONAA >60 05/17/2022 0351   GFRAA 81 11/11/2020 1211       Component Value Date/Time   WBC 7.8 09/23/2022 1134   RBC 4.24 09/23/2022 1134   HGB 11.8 (L) 09/23/2022 1134   HGB 10.7 (L) 09/14/2021 1332   HCT 37.2 09/23/2022 1134   HCT 34.3 09/14/2021 1332  PLT 260.0 09/23/2022 1134   PLT 229 09/14/2021 1332   MCV 87.6 09/23/2022 1134   MCV 87 09/14/2021 1332   MCH 26.4 05/17/2022 0351   MCHC 31.6 09/23/2022 1134   RDW 18.6 (H) 09/23/2022 1134   RDW 15.0 09/14/2021 1332   LYMPHSABS 1.8 09/23/2022 1134   LYMPHSABS 1.4 07/25/2018 1234    MONOABS 0.6 09/23/2022 1134   EOSABS 0.2 09/23/2022 1134   EOSABS 0.2 07/25/2018 1234   BASOSABS 0.0 09/23/2022 1134   BASOSABS 0.0 07/25/2018 1234    No results found for: "POCLITH", "LITHIUM"   No results found for: "PHENYTOIN", "PHENOBARB", "VALPROATE", "CBMZ"   .res Assessment: Plan:    Plan:   PDMP reviewed  Zoloft '100mg'$  daily - take two tablets daily Rexulti 0.'5mg'$  daily  RTC 3 months  Patient advised to contact office with any questions, adverse effects, or acute worsening in signs and symptoms.  Time spent with patient was 20 minutes. Greater than 50% of face to face time with patient was spent on counseling and coordination of care.    Discussed potential metabolic side effects associated with atypical antipsychotics, as well as potential risk for movement side effects. Advised pt to contact office if movement side effects occur.  Diagnoses and all orders for this visit:  Major depressive disorder, recurrent episode, moderate (HCC) -     sertraline (ZOLOFT) 100 MG tablet; Take 2 tablets (200 mg total) by mouth daily.  Obsessional thoughts -     sertraline (ZOLOFT) 100 MG tablet; Take 2 tablets (200 mg total) by mouth daily. -     brexpiprazole (REXULTI) 1 MG TABS tablet; Take 1 tablet (1 mg total) by mouth daily.  Insomnia, unspecified type     Please see After Visit Summary for patient specific instructions.  Future Appointments  Date Time Provider Lane  12/21/2022  1:00 PM Cottle, Lucious Groves, LCSW [facility] None  01/04/2023  1:00 PM Cottle, Bambi G, LCSW [facility] None  01/18/2023  1:00 PM Cottle, Bambi G, LCSW [facility] None  02/01/2023  1:00 PM Cottle, Bambi G, LCSW [facility] None  02/10/2023  1:20 PM Jettie Booze, MD [facility]  02/15/2023  1:00 PM Cottle, Bambi G, LCSW [facility] None  03/01/2023  1:00 PM Cottle, Bambi G, LCSW [facility] None  03/15/2023  1:00 PM Cottle, Bambi G, LCSW [facility] None  03/29/2023  1:00 PM Cottle, Bambi G,  LCSW [facility] None  06/23/2023  2:00 PM Biagio Borg, MD [facility] None    No orders of the defined types were placed in this encounter.     -------------------------------

## 2022-12-20 ENCOUNTER — Other Ambulatory Visit: Payer: Self-pay | Admitting: Internal Medicine

## 2022-12-20 ENCOUNTER — Other Ambulatory Visit: Payer: Self-pay

## 2022-12-20 ENCOUNTER — Other Ambulatory Visit (HOSPITAL_COMMUNITY): Payer: Self-pay

## 2022-12-20 ENCOUNTER — Encounter (HOSPITAL_COMMUNITY): Payer: Self-pay

## 2022-12-20 MED ORDER — POLYSACCHARIDE IRON COMPLEX 150 MG PO CAPS
150.0000 mg | ORAL_CAPSULE | Freq: Every day | ORAL | 1 refills | Status: DC
  Filled 2022-12-20: qty 90, 90d supply, fill #0
  Filled 2023-03-17: qty 90, 90d supply, fill #1

## 2022-12-21 ENCOUNTER — Telehealth: Payer: Self-pay

## 2022-12-21 ENCOUNTER — Other Ambulatory Visit: Payer: Self-pay

## 2022-12-21 ENCOUNTER — Ambulatory Visit (INDEPENDENT_AMBULATORY_CARE_PROVIDER_SITE_OTHER): Payer: BC Managed Care – PPO | Admitting: Psychology

## 2022-12-21 DIAGNOSIS — F331 Major depressive disorder, recurrent, moderate: Secondary | ICD-10-CM | POA: Diagnosis not present

## 2022-12-21 DIAGNOSIS — F428 Other obsessive-compulsive disorder: Secondary | ICD-10-CM | POA: Diagnosis not present

## 2022-12-21 NOTE — Telephone Encounter (Signed)
Patient accepts appointment with Terri Piedra tomorrow morning.   Beryle Flock, RN

## 2022-12-21 NOTE — Telephone Encounter (Signed)
Patient called, previously saw Marya Amsler for LLE cellulitis. Had cefadroxil on hand for flare up.   Ended up having to take cefadroxil x 7 days and finished this course on Monday 1/22. She is still experiencing redness, pain, and swelling in the left lower extremity. Denies fever or chills.   She would like to know if she needs to come in for an appointment or if additional antibiotics can be prescribed. Will route to provider.   Beryle Flock, RN

## 2022-12-21 NOTE — Progress Notes (Signed)
[facility] Counselor/Therapist Progress Note  Patient ID: Alexandra Beck, MRN: [id number],    Date: 12/21/2022  Time Spent: 60 minutes  Treatment Type: Individual Therapy  Reported Symptoms: sadness,   Mental Status Exam: Appearance:  Casual     Behavior: Appropriate  Motor: Normal  Speech/Language:  Normal Rate  Affect: Appropriate  Mood: sad  Thought process: normal  Thought content:   WNL  Sensory/Perceptual disturbances:   WNL  Orientation: oriented to person, place, time/date, and situation  Attention: Good  Concentration: Good  Memory: WNL  Fund of knowledge:  Good  Insight:   Good  Judgment:  Good  Impulse Control: Good   Risk Assessment: Danger to Self:  No Self-injurious Behavior: No Danger to Others: No Duty to Warn:no Physical Aggression / Violence:No  Access to Firearms a concern: No  Gang Involvement:No   Subjective: The patient attended a face-to-face individual therapy session today via video visit .  The patient gave verbal consent for the video visit to be on WebEx.  The patient was in her home alone and the therapist was in the office.  The patient presents as a little sad today.  The patient reports that she had cellulitis over the last week and she is just feeling down.  She states that she did take some antibiotics and hopefully it has helped.  The patient reports that she did see Barnett Applebaum and Barnett Applebaum put her on Rexulti to augment her other antidepressants but she has not started taking those yet.  I encouraged her to contact her infectious disease doctor to talk to them about making sure that the infection is cleared up.  I also encouraged her to go ahead and make sure that she got her prescriptions filled so that she could deal with her sadness.  We will continue to monitor the her the situation as I am wondering if she might have bipolar 2.  At this point we just need to observe symptoms.  Interventions: Cognitive Behavioral Therapy,  Assertiveness/Communication, and Problem solving  Diagnosis:Major depressive disorder, recurrent episode, moderate (HCC)  Obsessional thoughts  Plan: Treatment Plan  Strengths/Abilities:  Intelligent, ability for insight, supportive wife  Treatment Preferences:  Outpatient Individual therapy  Statement of Needs:  "I Need help with my depression and motivation. "  Symptoms:Depressed or irritable mood.:(Status: improved). Diminished interest in or  enjoyment of activities.(Status: improved). Feelings of hopelessness,  worthlessness, or inappropriate guilt.: (Status: improved). History of chronic or recurrent depression for which the client has taken antidepressant medication, been hospitalized, had  outpatient treatment, or had a course of electroconvulsive therapy.(Status:  maintained). Lack of energy.: (Status: maintained). Low self-esteem.:  (Status: improved). Poor concentration and indecisiveness.:  (Status: improved). Social withdrawal.:  (Status: maintained).  Unresolved grief issues.: (Status: maintained).  Problems Addressed:  Unipolar Depression  Goals:  LTG:1. Alleviate depressive symptoms and return to previous level of effective  functioning.  60% 2. Appropriately grieve the loss in order to normalize mood and to return  to previously adaptive level of functioning. 3. Develop healthy interpersonal relationships that lead to the alleviation  and help prevent the relapse of depression.  60% 4. Develop healthy thinking patterns and beliefs about self, others, and the world that lead to the alleviation and help prevent the relapse of  Depression  60% STG:1.Describe current and past experiences with depression including their impact on functioning and  attempts to resolve it.  60 % 2. Identify and replace thoughts and beliefs that support depression.  60 % 3.Learn and implement behavioral strategies to overcome depression. 60% 4.Verbalize an understanding of healthy and  unhealthy emotions with the intent of increasing the use of  healthy emotions to guide actions.  40 %  Target Date:  10/08/2023 Frequency: Bi Weekly Modality:Individual Interventions by Therapist:  CBT, problem solving, EMDR, insight oriented  Patient approved Treatment Plan  [place] Rejeana Brock, LCSW

## 2022-12-22 ENCOUNTER — Encounter: Payer: Self-pay | Admitting: Family

## 2022-12-22 ENCOUNTER — Ambulatory Visit (INDEPENDENT_AMBULATORY_CARE_PROVIDER_SITE_OTHER): Payer: BC Managed Care – PPO | Admitting: Family

## 2022-12-22 ENCOUNTER — Other Ambulatory Visit: Payer: Self-pay

## 2022-12-22 ENCOUNTER — Other Ambulatory Visit (HOSPITAL_COMMUNITY): Payer: Self-pay

## 2022-12-22 VITALS — BP 123/76 | HR 69 | Temp 97.9°F | Wt >= 6400 oz

## 2022-12-22 DIAGNOSIS — L03116 Cellulitis of left lower limb: Secondary | ICD-10-CM | POA: Diagnosis not present

## 2022-12-22 DIAGNOSIS — I872 Venous insufficiency (chronic) (peripheral): Secondary | ICD-10-CM | POA: Diagnosis not present

## 2022-12-22 MED ORDER — CEFADROXIL 500 MG PO CAPS
500.0000 mg | ORAL_CAPSULE | Freq: Two times a day (BID) | ORAL | 3 refills | Status: DC
  Filled 2022-12-22 (×2): qty 14, 7d supply, fill #0

## 2022-12-22 NOTE — Assessment & Plan Note (Signed)
Alexandra Beck has a BMI of 75.99 and continues to work with the [facility]. This in combination with management of venous insufficiency should continue to help reduction of risk for recurrent infection.

## 2022-12-22 NOTE — Progress Notes (Signed)
Subjective:    Patient ID: Alexandra Beck, female    DOB: 05-14-1981, 42 y.o.   MRN: [id number]  No chief complaint on file.   HPI:  Alexandra Beck is a 42 y.o. female with chronic venous insufficiency/lymphedema complicated by recurrent cellulitis last seen through telehealth visit on 10/06/21 with resolution of symptoms and Cefadroxil as needed for flare ups. Here today for an acute office visit.   Alexandra Beck started noticing some calf swelling and tightness of 12/12/21 after cleaning her house and started taking the on-demand Cefadroxil with symptoms improved with antibiotic that was completed on 12/19/22. No fevers or chills. Keeping feet elevated as able. Has not worsened since completion of course of medication. Still has some tenderness, redness and tingling. No other flares since her previous office visit.    Allergies  Allergen Reactions   Aspirin Hives and Swelling    REACTION: throat swelling, hives Other reaction(s): Unknown Other reaction(s): Unknown   Doxycycline Other (See Comments)    Abdominal pain, difficulty swallowing   Lisinopril Cough   Niacin And Related     Other reaction(s): Unknown Other reaction(s): Unknown   Niaspan [Niacin Er]     Caused flushing      Outpatient Medications Prior to Visit  Medication Sig Dispense Refill   acetaminophen (TYLENOL) 500 MG tablet Take 1,000-2,000 mg by mouth every 6 (six) hours as needed for mild pain.     brexpiprazole (REXULTI) 1 MG TABS tablet Take 1 tablet (1 mg total) by mouth daily. 30 tablet 2   ibuprofen (ADVIL) 200 MG tablet Take 800 mg by mouth every 6 (six) hours as needed for headache or moderate pain.     iron polysaccharides (FERREX 150) 150 MG capsule Take 1 capsule (150 mg total) by mouth daily. 90 capsule 1   levothyroxine (SYNTHROID) 150 MCG tablet Take 1 tablet (150 mcg total) by mouth daily before breakfast. 90 tablet 3   losartan (COZAAR) 100 MG tablet Take 1 tablet (100 mg total) by mouth daily. 90 tablet 3    metFORMIN (GLUCOPHAGE) 500 MG tablet Take 1 tablet (500 mg total) by mouth daily. 90 tablet 3   metoprolol succinate (TOPROL-XL) 50 MG 24 hr tablet Take 1 tablet (50 mg total) by mouth daily. 90 tablet 3   modafinil (PROVIGIL) 100 MG tablet Take '100mg'$  tablet by mouth daily at 9am (along with 200 mg tablet daily at 9am for a total of 300 mg daily) 60 tablet 3   modafinil (PROVIGIL) 200 MG tablet Take 1 tablet by mouth daily at 9am (along with 100 mg tablet daily at 9am (total '300mg'$ )) 60 tablet 3   rosuvastatin (CRESTOR) 10 MG tablet Take 1 tablet (10 mg total) by mouth daily. 90 tablet 3   Semaglutide, 2 MG/DOSE, 8 MG/3ML SOPN Inject 2 mg into the skin as directed once a week. 3 mL 0   sertraline (ZOLOFT) 100 MG tablet Take 2 tablets (200 mg total) by mouth daily. 180 tablet 3   spironolactone (ALDACTONE) 25 MG tablet Take 1 tablet (25 mg total) by mouth daily. 90 tablet 3   torsemide (DEMADEX) 20 MG tablet Take 4 tablets (80 mg total) by mouth 2 (two) times daily as needed. 720 tablet 3   traMADol (ULTRAM) 50 MG tablet Take 1 tablet (50 mg total) by mouth every 8 (eight) hours as needed. 40 tablet 0   triamcinolone cream (KENALOG) 0.1 % Apply 1 Application topically 2 (two) times daily. 30 g 3  No facility-administered medications prior to visit.     Past Medical History:  Diagnosis Date   Acute renal failure (ARF) ([place]) 11/2012    multifactorial-likely secondary to ATN in the setting of sepsis and hypotension, and also from rhabdomyolysis   Anemia    Cellulitis    Chronic diastolic CHF (congestive heart failure) (HCC)    Depression    Diabetes mellitus (HCC)    GERD (gastroesophageal reflux disease)    Hyperlipidemia    Hypertension    Hypothyroidism    Morbid obesity with BMI of 70 and over, adult ([place])    Obesity hypoventilation syndrome (HCC)    OSA (obstructive sleep apnea)    PVC's (premature ventricular contractions)    Recurrent cellulitis of lower leg    LLE, venous  insuff   Sepsis ([place]) 11/2012   Secondary to cellulitis   Sleep apnea    Venous insufficiency of leg      Past Surgical History:  Procedure Laterality Date   IR FLUORO GUIDE CV MIDLINE PICC RIGHT  03/28/2017   IR US GUIDE VASC ACCESS RIGHT  03/28/2017   WISDOM TOOTH EXTRACTION         Review of Systems  Constitutional:  Negative for chills, diaphoresis, fatigue and fever.  Respiratory:  Negative for cough, chest tightness, shortness of breath and wheezing.   Cardiovascular:  Positive for leg swelling. Negative for chest pain.  Gastrointestinal:  Negative for abdominal pain, diarrhea, nausea and vomiting.      Objective:    BP 123/76   Pulse 69   Temp 97.9 F (36.6 C) (Oral)   Wt (!) 429 lb (194.6 kg)   LMP 12/06/2022   SpO2 95%   BMI 75.99 kg/m  Nursing note and vital signs reviewed.  Physical Exam Constitutional:      General: She is not in acute distress.    Appearance: She is well-developed. She is obese.  Cardiovascular:     Rate and Rhythm: Normal rate and regular rhythm.     Heart sounds: Normal heart sounds.  Pulmonary:     Effort: Pulmonary effort is normal.     Breath sounds: Normal breath sounds.  Skin:    General: Skin is warm and dry.     Comments: Left distal lower extremity with mild erythema compared to right side with tenderness and firmness.   Neurological:     Mental Status: She is alert and oriented to person, place, and time.  Psychiatric:        Mood and Affect: Mood normal.         09/23/2022   10:54 AM 06/17/2022    1:44 PM 09/22/2021    2:10 PM 09/15/2021    3:22 PM 09/15/2021    2:43 PM  Depression screen PHQ 2/9  Decreased Interest 0 1 0 0 3  Down, Depressed, Hopeless 0 '1 1 1 1  '$ PHQ - 2 Score 0 '2 1 1 4  '$ Altered sleeping 0 0   1  Tired, decreased energy 0 2   3  Change in appetite 0 1   1  Feeling bad or failure about yourself  0 1   3  Trouble concentrating 0 0   1  Moving slowly or fidgety/restless 0 0   0  Suicidal  thoughts 0 0   0  PHQ-9 Score 0 6   13  Difficult doing work/chores Not difficult at all Somewhat difficult          Assessment &  Plan:    Patient Active Problem List   Diagnosis Date Noted   Diarrhea 09/24/2022   Abdominal pain 09/23/2022   Encounter for well adult exam with abnormal findings 06/17/2022   Bilateral hearing loss 06/17/2022   Anemia 06/17/2022   Well woman exam 06/17/2022   Fever 05/14/2022   Hypokalemia 05/14/2022   Lightheadedness 05/14/2022   Shortness of breath 05/14/2022   Pneumonia 05/13/2022   Eczema, dyshidrotic 09/19/2021   Chronic pain 09/19/2021   Cellulitis and abscess of neck 05/14/2021   Diabetes mellitus ([place]) 10/19/2020   Psoriasis 12/26/2019   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   Hypersomnolence 05/11/2018   Vitamin D deficiency 05/02/2018   Cellulitis of left leg 04/17/2017   Cellulitis of right leg 01/11/2017   Prolonged QT interval 12/16/2016   PVC's (premature ventricular contractions) 12/02/2016   Chronic diastolic CHF (congestive heart failure) ([place])    Hyperlipidemia    Diabetes mellitus type 2 in obese (HCC)    Hypothyroidism    GERD (gastroesophageal reflux disease)    Depression    Venous insufficiency of leg    Recurrent cellulitis of lower leg 12/25/2012   OSA (obstructive sleep apnea) 12/25/2012   Obesity hypoventilation syndrome ([place]) 12/25/2012   Obesity, morbid, BMI 50 or higher ([place]) 12/24/2012   Essential hypertension 06/26/2007     Problem List Items Addressed This Visit       Cardiovascular and Mediastinum   Venous insufficiency of leg    Alexandra Beck is elevating her legs when able and not currently wearing compression socks/sleeves. Fluid management with diuretics.         Other   Obesity, morbid, BMI 50 or higher ([place])    Alexandra Beck has a BMI of 75.99 and continues to work with the [facility]. This in combination with management of venous  insufficiency should continue to help reduction of risk for recurrent infection.       Cellulitis of left leg - Primary    Alexandra Beck has been doing well as this is her first flare of cellulitis in just over a year. Appears to be resolving and has not worsened after 7 day course of Cefadroxil. Given her history of recurrent infection will extend by 7 days. Continue non-pharmacological management including elevated legs when able and management of co-morbid conditions. Will plan to follow up in 6 months or sooner if needed .         I am having Alexandra Beck start on cefadroxil. I am also having her maintain her ibuprofen, acetaminophen, triamcinolone cream, levothyroxine, metFORMIN, metoprolol succinate, rosuvastatin, spironolactone, traMADol, losartan, torsemide, Semaglutide (2 MG/DOSE), modafinil, modafinil, sertraline, brexpiprazole, and iron polysaccharides.   Meds ordered this encounter  Medications   cefadroxil (DURICEF) 500 MG capsule    Sig: Take 1 capsule (500 mg total) by mouth 2 (two) times daily.    Dispense:  14 capsule    Refill:  3    Order Specific Question:   Supervising Provider    Answer:   Carlyle Basques [[id number]]     Follow-up: Return in about 6 months (around 06/22/2023), or if symptoms worsen or fail to improve.   Terri Piedra, MSN, FNP-C Nurse Practitioner [facility] for Infectious [facility] [place] number: [phone]

## 2022-12-22 NOTE — Patient Instructions (Signed)
Nice to see you.  Continue to take the Cefadroxil twice daily for 7 days.   Elevate your legs.  Refills have been sent to the pharmacy.  Plan for follow up as needed.   Have a great day and stay safe!

## 2022-12-22 NOTE — Assessment & Plan Note (Addendum)
Alexandra Beck is elevating her legs when able and not currently wearing compression socks/sleeves. Fluid management with diuretics.

## 2022-12-22 NOTE — Assessment & Plan Note (Signed)
Alexandra Beck has been doing well as this is her first flare of cellulitis in just over a year. Appears to be resolving and has not worsened after 7 day course of Cefadroxil. Given her history of recurrent infection will extend by 7 days. Continue non-pharmacological management including elevated legs when able and management of co-morbid conditions. Will plan to follow up in 6 months or sooner if needed .

## 2023-01-04 ENCOUNTER — Ambulatory Visit (INDEPENDENT_AMBULATORY_CARE_PROVIDER_SITE_OTHER): Payer: BC Managed Care – PPO | Admitting: Psychology

## 2023-01-04 DIAGNOSIS — F428 Other obsessive-compulsive disorder: Secondary | ICD-10-CM | POA: Diagnosis not present

## 2023-01-04 DIAGNOSIS — F331 Major depressive disorder, recurrent, moderate: Secondary | ICD-10-CM

## 2023-01-04 NOTE — Progress Notes (Signed)
[facility] Counselor/Therapist Progress Note  Patient ID: Alexandra Beck, MRN: [id number],    Date: 01/04/2023  Time Spent: 60 minutes  Treatment Type: Individual Therapy  Reported Symptoms: sadness,   Mental Status Exam: Appearance:  Casual     Behavior: Appropriate  Motor: Normal  Speech/Language:  Normal Rate  Affect: Appropriate  Mood: pleasant  Thought process: normal  Thought content:   WNL  Sensory/Perceptual disturbances:   WNL  Orientation: oriented to person, place, time/date, and situation  Attention: Good  Concentration: Good  Memory: WNL  Fund of knowledge:  Good  Insight:   Good  Judgment:  Good  Impulse Control: Good   Risk Assessment: Danger to Self:  No Self-injurious Behavior: No Danger to Others: No Duty to Warn:no Physical Aggression / Violence:No  Access to Firearms a concern: No  Gang Involvement:No   Subjective: The patient attended a face-to-face individual therapy session today via video visit .  The patient gave verbal consent for the video visit to be on WebEx.  The patient was in her home alone and the therapist was in the office.  The patient presents as pleasant and cooperative.  The patient reports reports that she does not notice a big difference with the [place] yet.  She does not seem to be as sad as she was last time and part of this may be that she has the infection cleared up in her leg.  She did report that she had to go back to the doctor and they put her on another round of antibiotics.  We talked about a situation that she had with her friend online and she seems to be managing that scenario much better.  She still has feelings for him but she is not letting him monopolize her time as much as she was before.  She did get validation from him that he still cares for her and he may be trying to also get her attention focused back on him.  We will continue to monitor the situation and I encouraged her to continue doing what  she is doing because it seems that she and her wife are doing much better because of the changes she has made.  Interventions: Cognitive Behavioral Therapy, Assertiveness/Communication, and Problem solving  Diagnosis:Major depressive disorder, recurrent episode, moderate (HCC)  Obsessional thoughts  Plan: Treatment Plan  Strengths/Abilities:  Intelligent, ability for insight, supportive wife  Treatment Preferences:  Outpatient Individual therapy  Statement of Needs:  "I Need help with my depression and motivation. "  Symptoms:Depressed or irritable mood.:(Status: improved). Diminished interest in or  enjoyment of activities.(Status: improved). Feelings of hopelessness,  worthlessness, or inappropriate guilt.: (Status: improved). History of chronic or recurrent depression for which the client has taken antidepressant medication, been hospitalized, had  outpatient treatment, or had a course of electroconvulsive therapy.(Status:  maintained). Lack of energy.: (Status: maintained). Low self-esteem.:  (Status: improved). Poor concentration and indecisiveness.:  (Status: improved). Social withdrawal.:  (Status: maintained).  Unresolved grief issues.: (Status: maintained).  Problems Addressed:  Unipolar Depression  Goals:  LTG:1. Alleviate depressive symptoms and return to previous level of effective  functioning.  60% 2. Appropriately grieve the loss in order to normalize mood and to return  to previously adaptive level of functioning. 3. Develop healthy interpersonal relationships that lead to the alleviation  and help prevent the relapse of depression.  60% 4. Develop healthy thinking patterns and beliefs about self, others, and the world that lead to the alleviation and  help prevent the relapse of  Depression  60% STG:1.Describe current and past experiences with depression including their impact on functioning and  attempts to resolve it.  60 % 2. Identify and replace thoughts and  beliefs that support depression.  60 % 3.Learn and implement behavioral strategies to overcome depression. 60% 4.Verbalize an understanding of healthy and unhealthy emotions with the intent of increasing the use of  healthy emotions to guide actions.  40 %  Target Date:  10/08/2023 Frequency: Bi Weekly Modality:Individual Interventions by Therapist:  CBT, problem solving, EMDR, insight oriented  Patient approved Treatment Plan  [place] Rejeana Brock, LCSW

## 2023-01-09 ENCOUNTER — Telehealth (INDEPENDENT_AMBULATORY_CARE_PROVIDER_SITE_OTHER): Payer: BC Managed Care – PPO | Admitting: Family Medicine

## 2023-01-10 NOTE — Progress Notes (Signed)
TeleHealth Visit:  This visit was completed with telemedicine (audio/video) technology. Alexandra Beck has verbally consented to this TeleHealth visit. The patient is located at home, the provider is located at home. The participants in this visit include the listed provider and patient. The visit was conducted today via MyChart video.  OBESITY Alexandra Beck is here to discuss her progress with her obesity treatment plan along with follow-up of her obesity related diagnoses.   Today's visit was # 69 Starting weight: 468 lbs Starting date: 04/17/2018 Weight at last in office visit: 432 lbs on 09/26/22 Total weight loss: 36 lbs at last in office visit on 09/26/22. Today's reported weight:  No weight reported.   Nutrition Plan: the Category 4 Plan - 75% adherence   Current exercise:  none  Interim History:  Alexandra Beck has not had a visit since 11/07/2022.  She feels things have gone fairly well overall and she is following the meal plan at about 75% adherence.  She has committed to following the plan much more closely over the last few weeks.  She is doing better with eating breakfast.  She eats breakfast at night rather than in the morning due to her work schedule.  Generally doing a bowl of special K cereal with fair life milk. She and her wife have been planning healthy meals such as [place] burgers, chicken tenders on low-carb wrap, barbecue chicken.  Denies intake of sugar sweetened beverages.  Drinks 0-calorie tea.  She and her wife do indulge in "Free Fridays" occasionally where they eat what they want.  Had recent bout of lower extremity cellulitis which is now resolved.  She has had several occurrences of this and has been hospitalized many times.  We discussed weight loss surgery briefly and she attended the seminar few years ago but never followed up with the process.  She does not seem in favor of it at this point.  She says she really wanted to be able to lose some weight on her own before  considering surgery.  Assessment/Plan:  1. 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 Last A1c was 5.8 on 05/13/2022. Medication(s): Metformin 500 mg daily, Ozempic 2 mg weekly.  Tolerating well.  Provides good appetite suppression.  Lab Results  Component Value Date   HGBA1C 5.8 (H) 05/13/2022   HGBA1C 5.9 (H) 11/11/2020   HGBA1C 5.6 12/17/2019   Lab Results  Component Value Date   MICROALBUR 8.05 (H) 06/26/2007   LDLCALC 94 06/17/2022   CREATININE 1.02 09/23/2022   Lab Results  Component Value Date   GFR 67.95 09/23/2022   GFR 83.01 03/23/2018   GFR 71.50 09/16/2016    Plan: Continue metformin 500 mg daily. Refill Ozempic 2 mg weekly.  2. Obesity: Current BMI 6.5 Alexandra Beck is currently in the action stage of change. As such, her goal is to continue with weight loss efforts.  She has agreed to the Category 4 Plan.   Exercise goals: All adults should avoid inactivity. Some physical activity is better than none, and adults who participate in any amount of physical activity gain some health benefits.  Behavioral modification strategies: increasing lean protein intake, decreasing simple carbohydrates, and planning for success.  Alexandra Beck has agreed to follow-up with our clinic in 3 weeks in the office with Dr. Marquis Lunch.   No orders of the defined types were placed in this encounter.   Medications Discontinued During This Encounter  Medication Reason   Semaglutide, 2 MG/DOSE, 8 MG/3ML  SOPN Reorder     Meds ordered this encounter  Medications   Semaglutide, 2 MG/DOSE, 8 MG/3ML SOPN    Sig: Inject 2 mg into the skin as directed once a week.    Dispense:  3 mL    Refill:  0    Order Specific Question:   Supervising Provider    Answer:   Glennis Brink [[id number]]      Objective:   VITALS: Per patient if applicable, see vitals. GENERAL: Alert and in no acute distress. CARDIOPULMONARY: No increased WOB. Speaking in clear  sentences.  PSYCH: Pleasant and cooperative. Speech normal rate and rhythm. Affect is appropriate. Insight and judgement are appropriate. Attention is focused, linear, and appropriate.  NEURO: Oriented as arrived to appointment on time with no prompting.   Lab Results  Component Value Date   CREATININE 1.02 09/23/2022   BUN 29 (H) 09/23/2022   NA 137 09/23/2022   K 4.0 09/23/2022   CL 98 09/23/2022   CO2 31 09/23/2022   Lab Results  Component Value Date   ALT 14 09/23/2022   AST 12 09/23/2022   ALKPHOS 74 09/23/2022   BILITOT 0.4 09/23/2022   Lab Results  Component Value Date   HGBA1C 5.8 (H) 05/13/2022   HGBA1C 5.9 (H) 11/11/2020   HGBA1C 5.6 12/17/2019   HGBA1C 5.6 07/04/2019   HGBA1C 5.4 12/04/2018   Lab Results  Component Value Date   INSULIN 23.6 11/11/2020   INSULIN 17.1 12/17/2019   INSULIN 23.5 07/04/2019   INSULIN 25.8 (H) 12/04/2018   INSULIN 24.7 07/25/2018   Lab Results  Component Value Date   TSH 4.38 06/17/2022   Lab Results  Component Value Date   CHOL 185 06/17/2022   HDL 53.70 06/17/2022   LDLCALC 94 06/17/2022   LDLDIRECT 141.0 03/02/2016   TRIG 187.0 (H) 06/17/2022   CHOLHDL 3 06/17/2022   Lab Results  Component Value Date   WBC 7.8 09/23/2022   HGB 11.8 (L) 09/23/2022   HCT 37.2 09/23/2022   MCV 87.6 09/23/2022   PLT 260.0 09/23/2022   Lab Results  Component Value Date   IRON 34 (L) 06/17/2022   TIBC 527.8 (H) 06/17/2022   FERRITIN 15.4 06/17/2022   Lab Results  Component Value Date   VD25OH 30.2 11/11/2020   VD25OH 38.2 12/17/2019   VD25OH 34.6 07/04/2019    Attestation Statements:   Reviewed by clinician on day of visit: allergies, medications, problem list, medical history, surgical history, family history, social history, and previous encounter notes.  Time spent on visit including the items listed below was 30 minutes.  -preparing to see the patient (e.g., review of tests, history, previous notes) -obtaining and/or  reviewing separately obtained history -counseling and educating the patient/family/caregiver -documenting clinical information in the electronic or other health record -ordering medications, tests, or procedures -independently interpreting results and communicating results to the patient/ family/caregiver -referring and communicating with other health care professionals  -care coordination   This was prepared with the assistance of Dragon [employer].  Occasional wrong-word or sound-a-like substitutions may have occurred due to the inherent limitations of voice recognition software.

## 2023-01-11 ENCOUNTER — Encounter (INDEPENDENT_AMBULATORY_CARE_PROVIDER_SITE_OTHER): Payer: Self-pay | Admitting: Family Medicine

## 2023-01-11 ENCOUNTER — Telehealth (INDEPENDENT_AMBULATORY_CARE_PROVIDER_SITE_OTHER): Payer: BC Managed Care – PPO | Admitting: Family Medicine

## 2023-01-11 ENCOUNTER — Other Ambulatory Visit: Payer: Self-pay

## 2023-01-11 DIAGNOSIS — E669 Obesity, unspecified: Secondary | ICD-10-CM | POA: Diagnosis not present

## 2023-01-11 DIAGNOSIS — Z7985 Long-term (current) use of injectable non-insulin antidiabetic drugs: Secondary | ICD-10-CM

## 2023-01-11 DIAGNOSIS — Z7984 Long term (current) use of oral hypoglycemic drugs: Secondary | ICD-10-CM

## 2023-01-11 DIAGNOSIS — E1159 Type 2 diabetes mellitus with other circulatory complications: Secondary | ICD-10-CM

## 2023-01-11 DIAGNOSIS — Z6841 Body Mass Index (BMI) 40.0 and over, adult: Secondary | ICD-10-CM | POA: Diagnosis not present

## 2023-01-11 MED ORDER — SEMAGLUTIDE (2 MG/DOSE) 8 MG/3ML ~~LOC~~ SOPN
2.0000 mg | PEN_INJECTOR | SUBCUTANEOUS | 0 refills | Status: DC
  Filled 2023-01-11: qty 3, 28d supply, fill #0

## 2023-01-18 ENCOUNTER — Ambulatory Visit (INDEPENDENT_AMBULATORY_CARE_PROVIDER_SITE_OTHER): Payer: BC Managed Care – PPO | Admitting: Psychology

## 2023-01-18 DIAGNOSIS — F428 Other obsessive-compulsive disorder: Secondary | ICD-10-CM

## 2023-01-18 DIAGNOSIS — F331 Major depressive disorder, recurrent, moderate: Secondary | ICD-10-CM

## 2023-01-18 NOTE — Progress Notes (Signed)
[facility] Counselor/Therapist Progress Note  Patient ID: Alexandra Beck, MRN: [id number],    Date: 01/18/2023  Time Spent: 60 minutes  Treatment Type: Individual Therapy  Reported Symptoms: sadness,   Mental Status Exam: Appearance:  Casual     Behavior: Appropriate  Motor: Normal  Speech/Language:  Normal Rate  Affect: depressed  Mood: depressed  Thought process: normal  Thought content:   WNL  Sensory/Perceptual disturbances:   WNL  Orientation: oriented to person, place, time/date, and situation  Attention: Good  Concentration: Good  Memory: WNL  Fund of knowledge:  Good  Insight:   Good  Judgment:  Good  Impulse Control: Good   Risk Assessment: Danger to Self:  No Self-injurious Behavior: No Danger to Others: No Duty to Warn:no Physical Aggression / Violence:No  Access to Firearms a concern: No  Gang Involvement:No   Subjective: The patient attended a face-to-face individual therapy session today via video visit .  The patient gave verbal consent for the video visit to be on WebEx.  The patient was in her home alone and the therapist was in the office.   The patient presents as sad and depressed.  The patient was tearful much of the time during the session.  She denies any problems that are going on and she is talking in a negative way about herself.  I recommended that she contact her medication provider as I think this may be a chemical issue as opposed to a situational issue that we can work on in therapy.  I would like her medication provider to explore the possibility of bipolar 2 with the patient moving forward.  I educated the patient on what bipolar 2 means and told her that I just wanted her evaluated to see if there is some other medication that might be more beneficial to her.  The patient is not having problems with her wife and no particular issues to speak of. Interventions: Cognitive Behavioral Therapy, Assertiveness/Communication, and  Problem solving  Diagnosis:Major depressive disorder, recurrent episode, moderate (HCC)  Obsessional thoughts  Plan: Treatment Plan  Strengths/Abilities:  Intelligent, ability for insight, supportive wife  Treatment Preferences:  Outpatient Individual therapy  Statement of Needs:  "I Need help with my depression and motivation. "  Symptoms:Depressed or irritable mood.:(Status: improved). Diminished interest in or  enjoyment of activities.(Status: improved). Feelings of hopelessness,  worthlessness, or inappropriate guilt.: (Status: improved). History of chronic or recurrent depression for which the client has taken antidepressant medication, been hospitalized, had  outpatient treatment, or had a course of electroconvulsive therapy.(Status:  maintained). Lack of energy.: (Status: maintained). Low self-esteem.:  (Status: improved). Poor concentration and indecisiveness.:  (Status: improved). Social withdrawal.:  (Status: maintained).  Unresolved grief issues.: (Status: maintained).  Problems Addressed:  Unipolar Depression  Goals:  LTG:1. Alleviate depressive symptoms and return to previous level of effective  functioning.  60% 2. Appropriately grieve the loss in order to normalize mood and to return  to previously adaptive level of functioning. 3. Develop healthy interpersonal relationships that lead to the alleviation  and help prevent the relapse of depression.  60% 4. Develop healthy thinking patterns and beliefs about self, others, and the world that lead to the alleviation and help prevent the relapse of  Depression  60% STG:1.Describe current and past experiences with depression including their impact on functioning and  attempts to resolve it.  60 % 2. Identify and replace thoughts and beliefs that support depression.  60 % 3.Learn and implement behavioral  strategies to overcome depression. 60% 4.Verbalize an understanding of healthy and unhealthy emotions with the intent  of increasing the use of  healthy emotions to guide actions.  40 %  Target Date:  10/08/2023 Frequency: Bi Weekly Modality:Individual Interventions by Therapist:  CBT, problem solving, EMDR, insight oriented  Patient approved Treatment Plan  Ryla Cauthon Rejeana Brock, LCSW

## 2023-01-23 ENCOUNTER — Other Ambulatory Visit: Payer: Self-pay

## 2023-01-23 ENCOUNTER — Other Ambulatory Visit (HOSPITAL_COMMUNITY): Payer: Self-pay

## 2023-01-26 ENCOUNTER — Other Ambulatory Visit (HOSPITAL_COMMUNITY): Payer: Self-pay

## 2023-01-26 ENCOUNTER — Encounter: Payer: Self-pay | Admitting: Adult Health

## 2023-01-26 ENCOUNTER — Telehealth (INDEPENDENT_AMBULATORY_CARE_PROVIDER_SITE_OTHER): Payer: BC Managed Care – PPO | Admitting: Adult Health

## 2023-01-26 DIAGNOSIS — G4700 Insomnia, unspecified: Secondary | ICD-10-CM | POA: Diagnosis not present

## 2023-01-26 DIAGNOSIS — F428 Other obsessive-compulsive disorder: Secondary | ICD-10-CM

## 2023-01-26 DIAGNOSIS — F331 Major depressive disorder, recurrent, moderate: Secondary | ICD-10-CM

## 2023-01-26 MED ORDER — LAMOTRIGINE 25 MG PO TABS
ORAL_TABLET | ORAL | 2 refills | Status: DC
  Filled 2023-01-26: qty 60, 30d supply, fill #0
  Filled 2023-03-08: qty 60, 7d supply, fill #1
  Filled 2023-03-17: qty 60, fill #2

## 2023-01-26 NOTE — Progress Notes (Signed)
Alexandra Beck [id number] 07-16-1981 42 y.o.  Virtual Visit via Video Note  I connected with pt @ on 01/26/23 at  4:20 PM EST by a video enabled telemedicine application and verified that I am speaking with the correct person using two identifiers.   I discussed the limitations of evaluation and management by telemedicine and the availability of in person appointments. The patient expressed understanding and agreed to proceed.  I discussed the assessment and treatment plan with the patient. The patient was provided an opportunity to ask questions and all were answered. The patient agreed with the plan and demonstrated an understanding of the instructions.   The patient was advised to call back or seek an in-person evaluation if the symptoms worsen or if the condition fails to improve as anticipated.  I provided 20 minutes of non-face-to-face time during this encounter.  The patient was located at home.  The provider was located at [place].   Aloha Gell, NP   Subjective:   Patient ID:  Alexandra Beck is a 42 y.o. (DOB [birth year]) female.  Chief Complaint: No chief complaint on file.   HPI MANNING AVELLA presents for follow-up of  insomnia, obsessional thoughts and depression.  Describes mood today as "not good". Pleasant. Decreased tearfulness. Mood symptoms - reports depression, anxiety, and irritability - more depressed. Reports worry, rumination, and over thinking. Denies obsessive thoughts or acts - "people thinking the worst about me". Mood is lower - started trending upward, then low again.Stating "I'm not doing good". Does not feel like the increase in Rexulti was beneficial and is willing to consider other options. Seeing therapist - Bambi Cottle for therapy. Varying interest and motivation. Taking medications as prescribed. Energy levels lower. Active, does not have a regular exercise routine.  Enjoys some usual interests and activities. Married. Lives with wife  and 3 cats. Spending time with family. Gaming online with friends. Appetite adequate not much of an appetite. Taking Ozempic. Weight loss. Working with a weight management clinic.  Sleeping well most nights. Averages 7 to 8 hours. Focus and concentration stable. Completing tasks. Managing minimal aspects of household. Works 12 to 9 - at [employer].  Denies SI or HI.  Denies AH or VH. Denies self harm. Denies substance use. Seeing Bambi Cottle for therapy.  Previous medication trials: Trialed on Wellbutrin at age 59, Trintellix-weird dreams, [place]   Review of Systems:  Review of Systems  Musculoskeletal:  Negative for gait problem.  Neurological:  Negative for tremors.  Psychiatric/Behavioral:         Please refer to HPI    Medications: I have reviewed the patient's current medications.  Current Outpatient Medications  Medication Sig Dispense Refill   acetaminophen (TYLENOL) 500 MG tablet Take 1,000-2,000 mg by mouth every 6 (six) hours as needed for mild pain.     brexpiprazole (REXULTI) 1 MG TABS tablet Take 1 tablet (1 mg total) by mouth daily. 30 tablet 2   cefadroxil (DURICEF) 500 MG capsule Take 1 capsule by mouth 2 times daily. 14 capsule 3   ibuprofen (ADVIL) 200 MG tablet Take 800 mg by mouth every 6 (six) hours as needed for headache or moderate pain.     iron polysaccharides (FERREX 150) 150 MG capsule Take 1 capsule (150 mg total) by mouth daily. 90 capsule 1   levothyroxine (SYNTHROID) 150 MCG tablet Take 1 tablet (150 mcg total) by mouth daily before breakfast. 90 tablet 3   losartan (COZAAR) 100 MG  tablet Take 1 tablet (100 mg total) by mouth daily. 90 tablet 3   metFORMIN (GLUCOPHAGE) 500 MG tablet Take 1 tablet (500 mg total) by mouth daily. 90 tablet 3   metoprolol succinate (TOPROL-XL) 50 MG 24 hr tablet Take 1 tablet (50 mg total) by mouth daily. 90 tablet 3   modafinil (PROVIGIL) 100 MG tablet Take '100mg'$  tablet by mouth daily at 9am (along with 200 mg  tablet daily at 9am for a total of 300 mg daily) 60 tablet 3   modafinil (PROVIGIL) 200 MG tablet Take 1 tablet by mouth daily at 9am (along with 100 mg tablet daily at 9am (total '300mg'$ )) 60 tablet 3   rosuvastatin (CRESTOR) 10 MG tablet Take 1 tablet (10 mg total) by mouth daily. 90 tablet 3   Semaglutide, 2 MG/DOSE, 8 MG/3ML SOPN Inject 2 mg into the skin as directed once a week. 3 mL 0   sertraline (ZOLOFT) 100 MG tablet Take 2 tablets (200 mg total) by mouth daily. 180 tablet 3   spironolactone (ALDACTONE) 25 MG tablet Take 1 tablet (25 mg total) by mouth daily. 90 tablet 3   torsemide (DEMADEX) 20 MG tablet Take 4 tablets (80 mg total) by mouth 2 (two) times daily as needed. 720 tablet 3   traMADol (ULTRAM) 50 MG tablet Take 1 tablet (50 mg total) by mouth every 8 (eight) hours as needed. 40 tablet 0   triamcinolone cream (KENALOG) 0.1 % Apply 1 Application topically 2 (two) times daily. 30 g 3   No current facility-administered medications for this visit.    Medication Side Effects: None  Allergies:  Allergies  Allergen Reactions   Aspirin Hives and Swelling    REACTION: throat swelling, hives Other reaction(s): Unknown Other reaction(s): Unknown   Doxycycline Other (See Comments)    Abdominal pain, difficulty swallowing   Lisinopril Cough   Niacin And Related     Other reaction(s): Unknown Other reaction(s): Unknown   Niaspan [Niacin Er]     Caused flushing    Past Medical History:  Diagnosis Date   Acute renal failure (ARF) ([place]) 11/2012    multifactorial-likely secondary to ATN in the setting of sepsis and hypotension, and also from rhabdomyolysis   Anemia    Cellulitis    Chronic diastolic CHF (congestive heart failure) (HCC)    Depression    Diabetes mellitus (HCC)    GERD (gastroesophageal reflux disease)    Hyperlipidemia    Hypertension    Hypothyroidism    Morbid obesity with BMI of 70 and over, adult ([place])    Obesity hypoventilation syndrome (HCC)    OSA  (obstructive sleep apnea)    PVC's (premature ventricular contractions)    Recurrent cellulitis of lower leg    LLE, venous insuff   Sepsis ([place]) 11/2012   Secondary to cellulitis   Sleep apnea    Venous insufficiency of leg     Family History  Problem Relation Age of Onset   Hypertension Mother    Diabetes Mother    High Cholesterol Mother    Thyroid disease Mother    Sleep apnea Mother    Obesity Mother    Hypertension Father    Heart disease Father        before age 60   High Cholesterol Father    Sleep apnea Father    Obesity Father     Social History   Socioeconomic History   Marital status: Single    Spouse name: Not on  file   Number of children: 0   Years of education: Not on file   Highest education level: Not on file  Occupational History   Occupation: Librarian    Employer: [employer]  Tobacco Use   Smoking status: Former    Packs/day: 0.50    Years: 8.00    Total pack years: 4.00    Types: Cigarettes    Quit date: 2011    Years since quitting: 13.1   Smokeless tobacco: Never  Vaping Use   Vaping Use: Never used  Substance and Sexual Activity   Alcohol use: No    Alcohol/week: 0.0 standard drinks of alcohol   Drug use: No   Sexual activity: Not on file  Other Topics Concern   Not on file  Social History Narrative   Librarian, 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   Please see review of systems for further details on the patient's review from today.   Objective:   Physical Exam:  There were no vitals taken for this visit.  Physical  Exam Constitutional:      General: She is not in acute distress. Musculoskeletal:        General: No deformity.  Neurological:     Mental Status: She is alert and oriented to person, place, and time.     Coordination: Coordination normal.  Psychiatric:        Attention and Perception: Attention and perception normal. She does not perceive auditory or visual hallucinations.        Mood and Affect: Mood normal. Mood is not anxious or depressed. Affect is not labile, blunt, angry or inappropriate.        Speech: Speech normal.        Behavior: Behavior normal.        Thought Content: Thought content normal. Thought content is not paranoid or delusional. Thought content does not include homicidal or suicidal ideation. Thought content does not include homicidal or suicidal plan.        Cognition and Memory: Cognition and memory normal.        Judgment: Judgment normal.     Comments: Insight intact     Lab Review:     Component Value Date/Time   NA 137 09/23/2022 1134   NA 141 09/14/2021 1331   K 4.0 09/23/2022 1134   CL 98 09/23/2022 1134   CO2 31 09/23/2022 1134   GLUCOSE 108 (H) 09/23/2022 1134   BUN 29 (H) 09/23/2022 1134   BUN 26 (H) 09/14/2021 1331   CREATININE 1.02 09/23/2022 1134   CREATININE 1.06 11/14/2016 1050   CALCIUM 9.5 09/23/2022 1134   CALCIUM 9.6 06/26/2007 0000   PROT 7.4 09/23/2022 1134   PROT 7.2 09/14/2021 1331   ALBUMIN 4.0 09/23/2022 1134   ALBUMIN 4.1 09/14/2021 1331   AST 12 09/23/2022 1134   ALT 14 09/23/2022 1134   ALKPHOS 74 09/23/2022 1134   BILITOT 0.4 09/23/2022 1134   BILITOT 0.2 09/14/2021 1331   GFRNONAA >60 05/17/2022 0351   GFRAA 81 11/11/2020 1211  Component Value Date/Time   WBC 7.8 09/23/2022 1134   RBC 4.24 09/23/2022 1134   HGB 11.8 (L) 09/23/2022 1134   HGB 10.7 (L) 09/14/2021 1332   HCT 37.2 09/23/2022 1134   HCT 34.3 09/14/2021 1332   PLT 260.0 09/23/2022 1134   PLT 229 09/14/2021 1332   MCV 87.6 09/23/2022 1134    MCV 87 09/14/2021 1332   MCH 26.4 05/17/2022 0351   MCHC 31.6 09/23/2022 1134   RDW 18.6 (H) 09/23/2022 1134   RDW 15.0 09/14/2021 1332   LYMPHSABS 1.8 09/23/2022 1134   LYMPHSABS 1.4 07/25/2018 1234   MONOABS 0.6 09/23/2022 1134   EOSABS 0.2 09/23/2022 1134   EOSABS 0.2 07/25/2018 1234   BASOSABS 0.0 09/23/2022 1134   BASOSABS 0.0 07/25/2018 1234    No results found for: "POCLITH", "LITHIUM"   No results found for: "PHENYTOIN", "PHENOBARB", "VALPROATE", "CBMZ"   .res Assessment: Plan:    Plan:   PDMP reviewed  Zoloft '100mg'$  daily - take two tablets daily Rexulti '1mg'$  daily  Add Lamictal '25mg'$  x 14, then '50mg'$  x 14 at beditme.  RTC 3 months  Patient advised to contact office with any questions, adverse effects, or acute worsening in signs and symptoms.  Time spent with patient was 20 minutes. Greater than 50% of face to face time with patient was spent on counseling and coordination of care.    Discussed potential metabolic side effects associated with atypical antipsychotics, as well as potential risk for movement side effects. Advised pt to contact office if movement side effects occur.  Counseled patient regarding potential benefits, risks, and side effects of Lamictal to include potential risk of Stevens-Johnson syndrome. Advised patient to stop taking Lamictal and contact office immediately if rash develops and to seek urgent medical attention if rash is severe and/or spreading quickly.    There are no diagnoses linked to this encounter.   Please see After Visit Summary for patient specific instructions.  Future Appointments  Date Time Provider [place]  01/26/2023  4:20 PM Cecile Gillispie, Berdie Ogren, NP [facility] None  01/30/2023 12:40 PM Laqueta Linden, MD [facility] None  02/01/2023  1:00 PM Cottle, Bambi G, LCSW [facility] None  02/10/2023  1:20 PM Jettie Booze, MD [facility]  02/15/2023  1:00 PM Cottle, Bambi G, LCSW [facility] None   02/20/2023 12:00 PM [place]ire, Joneen Boers, FNP [facility] None  03/01/2023  1:00 PM Cottle, Bambi G, LCSW [facility] None  03/15/2023  1:00 PM Cottle, Bambi G, LCSW [facility] None  03/29/2023  1:00 PM Cottle, Bambi G, LCSW [facility] None  06/23/2023  2:00 PM Biagio Borg, MD [facility] None    No orders of the defined types were placed in this encounter.     -------------------------------

## 2023-01-27 ENCOUNTER — Other Ambulatory Visit (HOSPITAL_COMMUNITY): Payer: Self-pay

## 2023-01-27 ENCOUNTER — Other Ambulatory Visit: Payer: Self-pay

## 2023-01-30 ENCOUNTER — Encounter (INDEPENDENT_AMBULATORY_CARE_PROVIDER_SITE_OTHER): Payer: Self-pay | Admitting: Family Medicine

## 2023-01-30 ENCOUNTER — Ambulatory Visit (INDEPENDENT_AMBULATORY_CARE_PROVIDER_SITE_OTHER): Payer: BC Managed Care – PPO | Admitting: Family Medicine

## 2023-01-30 VITALS — BP 111/61 | HR 70 | Temp 97.9°F | Ht 63.0 in | Wt >= 6400 oz

## 2023-01-30 DIAGNOSIS — Z6841 Body Mass Index (BMI) 40.0 and over, adult: Secondary | ICD-10-CM

## 2023-01-30 DIAGNOSIS — E1165 Type 2 diabetes mellitus with hyperglycemia: Secondary | ICD-10-CM | POA: Diagnosis not present

## 2023-01-30 DIAGNOSIS — E1159 Type 2 diabetes mellitus with other circulatory complications: Secondary | ICD-10-CM

## 2023-01-30 DIAGNOSIS — E669 Obesity, unspecified: Secondary | ICD-10-CM | POA: Diagnosis not present

## 2023-01-30 DIAGNOSIS — I152 Hypertension secondary to endocrine disorders: Secondary | ICD-10-CM | POA: Diagnosis not present

## 2023-01-30 DIAGNOSIS — Z7985 Long-term (current) use of injectable non-insulin antidiabetic drugs: Secondary | ICD-10-CM

## 2023-01-30 NOTE — Progress Notes (Signed)
Chief Complaint:   OBESITY Alexandra Beck is here to discuss her progress with her obesity treatment plan along with follow-up of her obesity related diagnoses. Alexandra Beck is on the Category 4 Plan and states she is following her eating plan approximately 70% of the time. Alexandra Beck states she is not currently exercising.  Today's visit was #: 15 Starting weight: 468 lbs Starting date: 04/17/2018 Today's weight: 429 lbs Today's date: 01/30/2023 Total lbs lost to date: 39 Total lbs lost since last in-office visit: 3  Interim History: Patient first in office appointment since Dec 11.  She has been seeing Dawn.  Her holiday was low key and schedule changes made it so she did not exchange gifts until mid January.  Patient voices she is going thru some mental health changes and a question of a diagnosis of bipolar 2.  Her psychiatrist prescribed her a mood stabilizer (Lamictal) which patient has not started taking yet.  She is going to start the medication tonight.  Food wise she would like to stick with same food plan she has done previously.  She is eating sandwiches again for lunch again.  Mentions she has had a rough start to the year.  Subjective:   1. Type 2 diabetes mellitus with hyperglycemia, without long-term current use of insulin (HCC) Alexandra Beck's last A1c was 5.8 with an insulin level of 23.6. She is on Ozempic.  2. Hypertension associated with diabetes ([place]) BP controlled today. Alexandra Beck is on Cozaar,Toprol, aldactone, and Demadex.  Assessment/Plan:   1. Type 2 diabetes mellitus with hyperglycemia, without long-term current use of insulin (HCC) Good blood sugar control is important to decrease the likelihood of diabetic complications such as nephropathy, neuropathy, limb loss, blindness, coronary artery disease, and death. Intensive lifestyle modification including diet, exercise and weight loss are the first line of treatment for diabetes.  Labs at next in office appt.  2. Hypertension associated  with diabetes ([place]) Alexandra Beck is working on healthy weight loss and exercise to improve blood pressure control. We will watch for signs of hypotension as she continues her lifestyle modifications. Follow up on BP at next appt.  3. BMI 70 and over, adult ([place]) 4. Obesity with starting BMI of 85.6 Alexandra Beck is currently in the action stage of change. As such, her goal is to continue with weight loss efforts. She has agreed to the Category 4 Plan.   Exercise goals: No exercise has been prescribed at this time.  Behavioral modification strategies: increasing lean protein intake, meal planning and cooking strategies, keeping healthy foods in the home, and planning for success.  Alexandra Beck has agreed to follow-up with our clinic in 3 weeks with NP Dawn and 6 weeks with Dr. Jearld Shines. She was informed of the importance of frequent follow-up visits to maximize her success with intensive lifestyle modifications for her multiple health conditions.   Objective:   Blood pressure 111/61, pulse 70, temperature 97.9 F (36.6 C), height 5\' 3"  (1.6 m), weight (!) 429 lb (194.6 kg), SpO2 95 %. Body mass index is 75.99 kg/m.  General: Cooperative, alert, well developed, in no acute distress. HEENT: Conjunctivae and lids unremarkable. Cardiovascular: Regular rhythm.  Lungs: Normal work of breathing. Neurologic: No focal deficits.   Lab Results  Component Value Date   CREATININE 1.02 09/23/2022   BUN 29 (H) 09/23/2022   NA 137 09/23/2022   K 4.0 09/23/2022   CL 98 09/23/2022   CO2 31 09/23/2022   Lab Results  Component Value Date  ALT 14 09/23/2022   AST 12 09/23/2022   ALKPHOS 74 09/23/2022   BILITOT 0.4 09/23/2022   Lab Results  Component Value Date   HGBA1C 5.8 (H) 05/13/2022   HGBA1C 5.9 (H) 11/11/2020   HGBA1C 5.6 12/17/2019   HGBA1C 5.6 07/04/2019   HGBA1C 5.4 12/04/2018   Lab Results  Component Value Date   INSULIN 23.6 11/11/2020   INSULIN 17.1 12/17/2019   INSULIN 23.5 07/04/2019    INSULIN 25.8 (H) 12/04/2018   INSULIN 24.7 07/25/2018   Lab Results  Component Value Date   TSH 4.38 06/17/2022   Lab Results  Component Value Date   CHOL 185 06/17/2022   HDL 53.70 06/17/2022   LDLCALC 94 06/17/2022   LDLDIRECT 141.0 03/02/2016   TRIG 187.0 (H) 06/17/2022   CHOLHDL 3 06/17/2022   Lab Results  Component Value Date   VD25OH 30.2 11/11/2020   VD25OH 38.2 12/17/2019   VD25OH 34.6 07/04/2019   Lab Results  Component Value Date   WBC 7.8 09/23/2022   HGB 11.8 (L) 09/23/2022   HCT 37.2 09/23/2022   MCV 87.6 09/23/2022   PLT 260.0 09/23/2022   Lab Results  Component Value Date   IRON 34 (L) 06/17/2022   TIBC 527.8 (H) 06/17/2022   FERRITIN 15.4 06/17/2022   Attestation Statements:   Reviewed by clinician on day of visit: allergies, medications, problem list, medical history, surgical history, family history, social history, and previous encounter notes.  Time spent on visit including pre-visit chart review and post-visit care and charting was 42 minutes.   I, Kathlene November, BS, CMA, am acting as transcriptionist for Coralie Common, MD.  I have reviewed the above documentation for accuracy and completeness, and I agree with the above. - Coralie Common, MD

## 2023-02-01 ENCOUNTER — Ambulatory Visit (INDEPENDENT_AMBULATORY_CARE_PROVIDER_SITE_OTHER): Payer: BC Managed Care – PPO | Admitting: Psychology

## 2023-02-01 DIAGNOSIS — F331 Major depressive disorder, recurrent, moderate: Secondary | ICD-10-CM | POA: Diagnosis not present

## 2023-02-02 NOTE — Progress Notes (Signed)
[facility] Counselor/Therapist Progress Note  Patient ID: Alexandra Beck, MRN: [id number],    Date: 02/01/2023  Time Spent: 60 minutes  Treatment Type: Individual Therapy  Reported Symptoms: sadness,   Mental Status Exam: Appearance:  Casual     Behavior: Appropriate  Motor: Normal  Speech/Language:  Normal Rate  Affect: depressed  Mood: depressed  Thought process: normal  Thought content:   WNL  Sensory/Perceptual disturbances:   WNL  Orientation: oriented to person, place, time/date, and situation  Attention: Good  Concentration: Good  Memory: WNL  Fund of knowledge:  Good  Insight:   Good  Judgment:  Good  Impulse Control: Good   Risk Assessment: Danger to Self:  No Self-injurious Behavior: No Danger to Others: No Duty to Warn:no Physical Aggression / Violence:No  Access to Firearms a concern: No  Gang Involvement:No   Subjective: The patient attended a face-to-face individual therapy session today via video visit .  The patient gave verbal consent for the video visit to be on WebEx.  The patient was in her home alone and the therapist was in the office.   The patient presents as sad and depressed.  Patient reports that she did go see Deloria Lair and she wrote her a prescription for Lamictal.  The patient has not had it filled yet.  We talked about her going ahead and getting it filled so that she can start taking the medicine and hopefully feel better.  The patient is continuing to give herself a hard time and beat herself up for anything she perceives is her fault and she is not good enough that.  After the patient starts back on the medicine is going to evaluate whether we need to do EMDR again around being good enough.  I am concerned that the patient is having mood swings and may have issues with possibly bipolar II.  Interventions: Cognitive Behavioral Therapy, Assertiveness/Communication, and Problem solving  Diagnosis:Major depressive  disorder, recurrent episode, moderate (HCC)  Plan: Treatment Plan  Strengths/Abilities:  Intelligent, ability for insight, supportive wife  Treatment Preferences:  Outpatient Individual therapy  Statement of Needs:  "I Need help with my depression and motivation. "  Symptoms:Depressed or irritable mood.:(Status: improved). Diminished interest in or  enjoyment of activities.(Status: improved). Feelings of hopelessness,  worthlessness, or inappropriate guilt.: (Status: improved). History of chronic or recurrent depression for which the client has taken antidepressant medication, been hospitalized, had  outpatient treatment, or had a course of electroconvulsive therapy.(Status:  maintained). Lack of energy.: (Status: maintained). Low self-esteem.:  (Status: improved). Poor concentration and indecisiveness.:  (Status: improved). Social withdrawal.:  (Status: maintained).  Unresolved grief issues.: (Status: maintained).  Problems Addressed:  Unipolar Depression  Goals:  LTG:1. Alleviate depressive symptoms and return to previous level of effective  functioning.  60% 2. Appropriately grieve the loss in order to normalize mood and to return  to previously adaptive level of functioning. 3. Develop healthy interpersonal relationships that lead to the alleviation  and help prevent the relapse of depression.  60% 4. Develop healthy thinking patterns and beliefs about self, others, and the world that lead to the alleviation and help prevent the relapse of  Depression  60% STG:1.Describe current and past experiences with depression including their impact on functioning and  attempts to resolve it.  60 % 2. Identify and replace thoughts and beliefs that support depression.  60 % 3.Learn and implement behavioral strategies to overcome depression. 60% 4.Verbalize an understanding of healthy and unhealthy  emotions with the intent of increasing the use of  healthy emotions to guide actions.  40  %  Target Date:  10/08/2023 Frequency: Bi Weekly Modality:Individual Interventions by Therapist:  CBT, problem solving, EMDR, insight oriented  Patient approved Treatment Plan  Alexandra Pha Rejeana Brock, LCSW

## 2023-02-03 ENCOUNTER — Other Ambulatory Visit (HOSPITAL_COMMUNITY): Payer: Self-pay

## 2023-02-10 ENCOUNTER — Ambulatory Visit: Payer: BC Managed Care – PPO | Admitting: Interventional Cardiology

## 2023-02-10 ENCOUNTER — Ambulatory Visit: Payer: BC Managed Care – PPO | Attending: Interventional Cardiology | Admitting: Interventional Cardiology

## 2023-02-14 ENCOUNTER — Encounter (INDEPENDENT_AMBULATORY_CARE_PROVIDER_SITE_OTHER): Payer: Self-pay | Admitting: Family Medicine

## 2023-02-14 ENCOUNTER — Other Ambulatory Visit (INDEPENDENT_AMBULATORY_CARE_PROVIDER_SITE_OTHER): Payer: Self-pay | Admitting: Family Medicine

## 2023-02-14 DIAGNOSIS — E1159 Type 2 diabetes mellitus with other circulatory complications: Secondary | ICD-10-CM

## 2023-02-14 NOTE — Telephone Encounter (Signed)
Please advise 

## 2023-02-15 ENCOUNTER — Ambulatory Visit (INDEPENDENT_AMBULATORY_CARE_PROVIDER_SITE_OTHER): Payer: BC Managed Care – PPO | Admitting: Psychology

## 2023-02-15 ENCOUNTER — Other Ambulatory Visit (HOSPITAL_COMMUNITY): Payer: Self-pay

## 2023-02-15 DIAGNOSIS — F428 Other obsessive-compulsive disorder: Secondary | ICD-10-CM

## 2023-02-15 DIAGNOSIS — F331 Major depressive disorder, recurrent, moderate: Secondary | ICD-10-CM | POA: Diagnosis not present

## 2023-02-15 NOTE — Progress Notes (Signed)
[facility] Counselor/Therapist Progress Note  Patient ID: TATIANA TIPPENS, MRN: [id number],    Date: 02/15/2023  Time Spent: 60 minutes  Treatment Type: Individual Therapy  Reported Symptoms: sadness,   Mental Status Exam: Appearance:  Casual     Behavior: Appropriate  Motor: Normal  Speech/Language:  Normal Rate  Affect: depressed  Mood: depressed  Thought process: normal  Thought content:   WNL  Sensory/Perceptual disturbances:   WNL  Orientation: oriented to person, place, time/date, and situation  Attention: Good  Concentration: Good  Memory: WNL  Fund of knowledge:  Good  Insight:   Good  Judgment:  Good  Impulse Control: Good   Risk Assessment: Danger to Self:  No Self-injurious Behavior: No Danger to Others: No Duty to Warn:no Physical Aggression / Violence:No  Access to Firearms a concern: No  Gang Involvement:No   Subjective: The patient attended a face-to-face individual therapy session today via video visit .  The patient gave verbal consent for the video visit to be on WebEx.  The patient was in her home alone and the therapist was in the office.   The patient continues to present as sad and depressed and somewhat irritable today.  She reports that she has started the medication about 2 weeks ago for mood stabilization.  She states that she is supposed to go up on the medicine today.  She is continuing to have crying spells and is very critical of herself.  I explained to her that I wanted to see if the medication would work so that we could do some more EMDR and that hopefully she would feel better with the medicine.  I also explained to her that if she did have to take this kind of medicine for bipolar disorder that she likely would have to take it ongoing. Interventions: Cognitive Behavioral Therapy, Assertiveness/Communication, and Problem solving  Diagnosis:Major depressive disorder, recurrent episode, moderate (HCC)  Obsessional  thoughts  Plan: Treatment Plan  Strengths/Abilities:  Intelligent, ability for insight, supportive wife  Treatment Preferences:  Outpatient Individual therapy  Statement of Needs:  "I Need help with my depression and motivation. "  Symptoms:Depressed or irritable mood.:(Status: improved). Diminished interest in or  enjoyment of activities.(Status: improved). Feelings of hopelessness,  worthlessness, or inappropriate guilt.: (Status: improved). History of chronic or recurrent depression for which the client has taken antidepressant medication, been hospitalized, had  outpatient treatment, or had a course of electroconvulsive therapy.(Status:  maintained). Lack of energy.: (Status: maintained). Low self-esteem.:  (Status: improved). Poor concentration and indecisiveness.:  (Status: improved). Social withdrawal.:  (Status: maintained).  Unresolved grief issues.: (Status: maintained).  Problems Addressed:  Unipolar Depression  Goals:  LTG:1. Alleviate depressive symptoms and return to previous level of effective  functioning.  60% 2. Appropriately grieve the loss in order to normalize mood and to return  to previously adaptive level of functioning. 3. Develop healthy interpersonal relationships that lead to the alleviation  and help prevent the relapse of depression.  60% 4. Develop healthy thinking patterns and beliefs about self, others, and the world that lead to the alleviation and help prevent the relapse of  Depression  60% STG:1.Describe current and past experiences with depression including their impact on functioning and  attempts to resolve it.  60 % 2. Identify and replace thoughts and beliefs that support depression.  60 % 3.Learn and implement behavioral strategies to overcome depression. 60% 4.Verbalize an understanding of healthy and unhealthy emotions with the intent of increasing the use of  healthy emotions to guide actions.  40 %  Target Date:   10/08/2023 Frequency: Bi Weekly Modality:Individual Interventions by Therapist:  CBT, problem solving, EMDR, insight oriented  Patient approved Treatment Plan  Christiann Hagerty Rejeana Brock, LCSW

## 2023-02-16 NOTE — Progress Notes (Signed)
TeleHealth Visit:  This visit was completed with telemedicine (audio/video) technology. Alexandra Beck has verbally consented to this TeleHealth visit. The patient is located at home, the provider is located at home. The participants in this visit include the listed provider and patient. The visit was conducted today via MyChart video.  OBESITY Alexandra Beck is here to discuss her progress with her obesity treatment plan along with follow-up of her obesity related diagnoses.   Today's visit was # 73 Starting weight: 468 lbs Starting date: 04/17/2018 Weight at last in office visit: 429 lbs on 01/30/23 Total weight loss: 39 lbs at last in office visit on 01/30/23. Today's reported weight: none reported  Nutrition Plan: the Category 4 plan - 55-60% adherence.  Current exercise: none  Interim History:  She occasionally skips her meal at night after work because she isn't hungry. They  eat breakfast after work.  They both work evening shift. She and her wife buy groceries weekly and they meal prep their meals ( lunch and dinner).  They  eat breakfast after work.  They both work evening shift. She and her wife have a cheat meal Idined out out pick up)every Friday night. She had an in office appointment March 4 and was down 3 pounds.  She had not been in the office for 4 months.  Pharmacotherapy: Alexandra Beck is on Ozempic 2 mg SQ weekly Adverse side effects: None Hunger is well controlled.  Cravings are moderately controlled.  Assessment/Plan:  1. Type 2 Diabetes Mellitus with other circulatory complication, without long-term current use of insulin HgbA1c is at goal. Last A1c was 5.8. Medication(s): Ozempic 2 mg SQ weekly and Metformin 500 mg once daily breakfast  Lab Results  Component Value Date   HGBA1C 5.8 (H) 05/13/2022   HGBA1C 5.9 (H) 11/11/2020   HGBA1C 5.6 12/17/2019   Lab Results  Component Value Date   MICROALBUR 8.05 (H) 06/26/2007   LDLCALC 94 06/17/2022   CREATININE 1.02 09/23/2022    Lab Results  Component Value Date   GFR 67.95 09/23/2022   GFR 83.01 03/23/2018   GFR 71.50 09/16/2016    Plan: Continue and refill Ozempic 2 mg SQ weekly Continue metformin once daily. Check A1c next office visit.  2.  Hot flashes She has been having which seem to be hot flashes for the last few months.  She has also had issues with mood-depression since Thanksgiving.  Lamictal added recently (currently on 50 mg).  Alexandra Beck PMHNP feels she may possibly have bipolar 2.  Alexandra Beck has not noticed a difference in mood yet.  She is having labs next office visit and wonders if we can check her hormone levels. Periods occur monthly. Not up-to-date on Pap, mammogram.  Plan: Advised her to make appointment with her wife's gynecologist for a checkup.  3. Morbid Obesity: Current BMI 76 Pharmacotherapy Plan Continue and refill  Ozempic 2 mg SQ weekly Alexandra Beck is currently in the action stage of change. As such, her goal is to continue with weight loss efforts.  She has agreed to the Category 4 plan.  Exercise goals: All adults should avoid inactivity. Some physical activity is better than none, and adults who participate in any amount of physical activity gain some health benefits.  Behavioral modification strategies: increasing lean protein intake, decreasing simple carbohydrates , no meal skipping, and planning for success.  Alexandra Beck has agreed to follow-up with our clinic in 4 weeks.   No orders of the defined types were placed in this encounter.  Medications Discontinued During This Encounter  Medication Reason   Semaglutide, 2 MG/DOSE, 8 MG/3ML SOPN Reorder     Meds ordered this encounter  Medications   Semaglutide, 2 MG/DOSE, 8 MG/3ML SOPN    Sig: Inject 2 mg into the skin as directed once a week.    Dispense:  3 mL    Refill:  0    Order Specific Question:   Supervising Provider    Answer:   Dell Ponto [[id number]]      Objective:   VITALS: Per patient if applicable,  see vitals. GENERAL: Alert and in no acute distress. CARDIOPULMONARY: No increased WOB. Speaking in clear sentences.  PSYCH: Pleasant and cooperative. Speech normal rate and rhythm. Affect is appropriate. Insight and judgement are appropriate. Attention is focused, linear, and appropriate.  NEURO: Oriented as arrived to appointment on time with no prompting.   Attestation Statements:   Reviewed by clinician on day of visit: allergies, medications, problem list, medical history, surgical history, family history, social history, and previous encounter notes.   This was prepared with the assistance of Presenter, broadcasting.  Occasional wrong-word or sound-a-like substitutions may have occurred due to the inherent limitations of voice recognition software.

## 2023-02-17 ENCOUNTER — Other Ambulatory Visit (INDEPENDENT_AMBULATORY_CARE_PROVIDER_SITE_OTHER): Payer: Self-pay | Admitting: Family Medicine

## 2023-02-17 DIAGNOSIS — E1159 Type 2 diabetes mellitus with other circulatory complications: Secondary | ICD-10-CM

## 2023-02-20 ENCOUNTER — Encounter (INDEPENDENT_AMBULATORY_CARE_PROVIDER_SITE_OTHER): Payer: Self-pay | Admitting: Family Medicine

## 2023-02-20 ENCOUNTER — Telehealth (INDEPENDENT_AMBULATORY_CARE_PROVIDER_SITE_OTHER): Payer: BC Managed Care – PPO | Admitting: Family Medicine

## 2023-02-20 ENCOUNTER — Encounter: Payer: Self-pay | Admitting: Interventional Cardiology

## 2023-02-20 ENCOUNTER — Telehealth: Payer: Self-pay | Admitting: Interventional Cardiology

## 2023-02-20 ENCOUNTER — Other Ambulatory Visit: Payer: Self-pay

## 2023-02-20 DIAGNOSIS — Z7985 Long-term (current) use of injectable non-insulin antidiabetic drugs: Secondary | ICD-10-CM

## 2023-02-20 DIAGNOSIS — Z7984 Long term (current) use of oral hypoglycemic drugs: Secondary | ICD-10-CM

## 2023-02-20 DIAGNOSIS — R232 Flushing: Secondary | ICD-10-CM | POA: Diagnosis not present

## 2023-02-20 DIAGNOSIS — Z6841 Body Mass Index (BMI) 40.0 and over, adult: Secondary | ICD-10-CM

## 2023-02-20 DIAGNOSIS — E1159 Type 2 diabetes mellitus with other circulatory complications: Secondary | ICD-10-CM | POA: Diagnosis not present

## 2023-02-20 MED ORDER — SEMAGLUTIDE (2 MG/DOSE) 8 MG/3ML ~~LOC~~ SOPN
2.0000 mg | PEN_INJECTOR | SUBCUTANEOUS | 0 refills | Status: DC
  Filled 2023-02-20: qty 3, 28d supply, fill #0

## 2023-02-20 NOTE — Telephone Encounter (Signed)
Alexandra Grayer, RN  P [facility] Scheduling Patient missed appointment today with Dr Irish Lack.  She has seen Dr Irish Lack but was going to originally follow with Dr Johney Frame. Can you check and see if she wants to continue to see Dr Irish Lack or follow with Dr Johney Frame? Thanks, Pat   LVM 3x with no success, letter sent

## 2023-03-01 ENCOUNTER — Ambulatory Visit (INDEPENDENT_AMBULATORY_CARE_PROVIDER_SITE_OTHER): Payer: BC Managed Care – PPO | Admitting: Psychology

## 2023-03-01 DIAGNOSIS — F428 Other obsessive-compulsive disorder: Secondary | ICD-10-CM

## 2023-03-01 DIAGNOSIS — F331 Major depressive disorder, recurrent, moderate: Secondary | ICD-10-CM

## 2023-03-02 NOTE — Progress Notes (Signed)
[facility] Counselor/Therapist Progress Note  Patient ID: TOMMYE CHUDY, MRN: [id number],    Date: 03/01/2023  Time Spent: 60 minutes  Treatment Type: Individual Therapy  Reported Symptoms: sadness,   Mental Status Exam: Appearance:  Casual     Behavior: Appropriate  Motor: Normal  Speech/Language:  Normal Rate  Affect: blunted  Mood: sad  Thought process: normal  Thought content:   WNL  Sensory/Perceptual disturbances:   WNL  Orientation: oriented to person, place, time/date, and situation  Attention: Good  Concentration: Good  Memory: WNL  Fund of knowledge:  Good  Insight:   Good  Judgment:  Good  Impulse Control: Good   Risk Assessment: Danger to Self:  No Self-injurious Behavior: No Danger to Others: No Duty to Warn:no Physical Aggression / Violence:No  Access to Firearms a concern: No  Gang Involvement:No   Subjective: The patient attended a face-to-face individual therapy session today via video visit .  The patient gave verbal consent for the video visit to be on WebEx.  The patient was in her home alone and the therapist was in the office.   The patient seems to be doing a little better today than she was the last time I saw her.  She does report that she thinks she feels a little bit better with the medicine.  We talked about what she is doing to take care of herself and it seems that she has some good things going on with her streaming.  The patient does not seem to have anything going on in her life that would cause her a great deal of depression right now so that is why I think that some of this depression is chemical.  We will continue to evaluate the circumstance and I will see her again in 2 weeks and see where she is at with her medication at that point.  The patient was encouraged to continue to do good things for herself and to continue with doing routine things that she needs to do.  Interventions: Cognitive Behavioral  Therapy, Assertiveness/Communication, and Problem solving  Diagnosis:Major depressive disorder, recurrent episode, moderate  Obsessional thoughts  Plan: Treatment Plan  Strengths/Abilities:  Intelligent, ability for insight, supportive wife  Treatment Preferences:  Outpatient Individual therapy  Statement of Needs:  "I Need help with my depression and motivation. "  Symptoms:Depressed or irritable mood.:(Status: improved). Diminished interest in or  enjoyment of activities.(Status: improved). Feelings of hopelessness,  worthlessness, or inappropriate guilt.: (Status: improved). History of chronic or recurrent depression for which the client has taken antidepressant medication, been hospitalized, had  outpatient treatment, or had a course of electroconvulsive therapy.(Status:  maintained). Lack of energy.: (Status: maintained). Low self-esteem.:  (Status: improved). Poor concentration and indecisiveness.:  (Status: improved). Social withdrawal.:  (Status: maintained).  Unresolved grief issues.: (Status: maintained).  Problems Addressed:  Unipolar Depression  Goals:  LTG:1. Alleviate depressive symptoms and return to previous level of effective  functioning.  60% 2. Appropriately grieve the loss in order to normalize mood and to return  to previously adaptive level of functioning. 3. Develop healthy interpersonal relationships that lead to the alleviation  and help prevent the relapse of depression.  60% 4. Develop healthy thinking patterns and beliefs about self, others, and the world that lead to the alleviation and help prevent the relapse of  Depression  60% STG:1.Describe current and past experiences with depression including their impact on functioning and  attempts to resolve  it.  60 % 2. Identify and replace thoughts and beliefs that support depression.  60 % 3.Learn and implement behavioral strategies to overcome depression. 60% 4.Verbalize an understanding of healthy and  unhealthy emotions with the intent of increasing the use of  healthy emotions to guide actions.  40 %  Target Date:  10/08/2023 Frequency: Bi Weekly Modality:Individual Interventions by Therapist:  CBT, problem solving, EMDR, insight oriented  Patient approved Treatment Plan  Asahel Risden Rejeana Brock, LCSW

## 2023-03-08 ENCOUNTER — Other Ambulatory Visit (HOSPITAL_COMMUNITY): Payer: Self-pay

## 2023-03-13 ENCOUNTER — Ambulatory Visit (INDEPENDENT_AMBULATORY_CARE_PROVIDER_SITE_OTHER): Payer: BC Managed Care – PPO | Admitting: Family Medicine

## 2023-03-13 ENCOUNTER — Encounter (INDEPENDENT_AMBULATORY_CARE_PROVIDER_SITE_OTHER): Payer: Self-pay | Admitting: Family Medicine

## 2023-03-13 VITALS — BP 151/89 | HR 74 | Temp 98.1°F | Ht 63.0 in | Wt >= 6400 oz

## 2023-03-13 DIAGNOSIS — E038 Other specified hypothyroidism: Secondary | ICD-10-CM

## 2023-03-13 DIAGNOSIS — E1169 Type 2 diabetes mellitus with other specified complication: Secondary | ICD-10-CM | POA: Diagnosis not present

## 2023-03-13 DIAGNOSIS — D508 Other iron deficiency anemias: Secondary | ICD-10-CM

## 2023-03-13 DIAGNOSIS — Z6841 Body Mass Index (BMI) 40.0 and over, adult: Secondary | ICD-10-CM

## 2023-03-13 DIAGNOSIS — E1165 Type 2 diabetes mellitus with hyperglycemia: Secondary | ICD-10-CM

## 2023-03-13 DIAGNOSIS — Z7985 Long-term (current) use of injectable non-insulin antidiabetic drugs: Secondary | ICD-10-CM

## 2023-03-13 DIAGNOSIS — E559 Vitamin D deficiency, unspecified: Secondary | ICD-10-CM

## 2023-03-13 DIAGNOSIS — E785 Hyperlipidemia, unspecified: Secondary | ICD-10-CM

## 2023-03-13 DIAGNOSIS — E669 Obesity, unspecified: Secondary | ICD-10-CM

## 2023-03-13 NOTE — Progress Notes (Signed)
Chief Complaint:   OBESITY Alexandra Beck is here to discuss her progress with her obesity treatment plan along with follow-up of her obesity related diagnoses. Alexandra Beck is on the Category 4 Plan and states she is following her eating plan approximately 60% of the time. Alexandra Beck states she is not exercising.  Today's visit was #: 72 Starting weight: 468 LBS Starting date: 04/17/2018 Today's weight: 441 LBS Today's date: 03/13/2023 Total lbs lost to date: 27 LBS Total lbs lost since last in-office visit: +12 LBS  Interim History: Patient had virtual appointment since last in office appointment.  She feels like she is "failing at life".  She feels fairly certain the weight she has gained in the last week is fluid.  She missed her  fluid pill two days of the last three days.  She started on lamictal and thinks she may be able to start feeling a bit of modd changes but isn't sure. She voices she is trying her best at the best of her ability to follow the meal plan but still feels like a failure.  She sees a therapist.  She is dealing with diarrhea that starts about 40 minutes after eating.  She discussed this with her PCP and was given a sample collection cup but hasn't been able to collect a sample yet.    Subjective:   1. Other iron deficiency anemia Patient last iron decreased, ferritin within normal limits, MCV within normal limits, hemoglobin decreased.   2. Type 2 diabetes mellitus with hyperglycemia, without long-term current use of insulin  Patient is on Ozempic and Crestor.  Patient last A1c not controlled.  3. Other specified hypothyroidism Patient is on Synthroid.  Patient denies palpitations, cold and heat intolerance.  4. Hyperlipidemia associated with type 2 diabetes mellitus Patient is on Crestor.  No side effects or myalgias or transaminitis.  5. Vitamin D deficiency Patient last vitamin D level still not at goal.  Patient is positive for fatigue.  Assessment/Plan:   1. Other  iron deficiency anemia Check labs today.  - CBC w/Diff/Platelet - Anemia panel  2. Type 2 diabetes mellitus with hyperglycemia, without long-term current use of insulin Check labs today.  - Comprehensive metabolic panel - Hemoglobin A1c - Insulin, random  Refill- Semaglutide, 2 MG/DOSE, 8 MG/3ML SOPN; Inject 2 mg into the skin as directed once a week.  Dispense: 9 mL; Refill: 0  3. Other specified hypothyroidism Check labs today.  - TSH  4. Hyperlipidemia associated with type 2 diabetes mellitus Check labs today.  - Lipid Panel With LDL/HDL Ratio  5. Vitamin D deficiency Check labs today.  - VITAMIN D 25 Hydroxy (Vit-D Deficiency, Fractures)  6. BMI 70 and over, adult  7. Obesity with starting BMI of 85.6 Alexandra Beck is currently in the action stage of change. As such, her goal is to continue with weight loss efforts. She has agreed to practicing portion control and making smarter food choices, such as increasing vegetables and decreasing simple carbohydrates.   Exercise goals: All adults should avoid inactivity. Some physical activity is better than none, and adults who participate in any amount of physical activity gain some health benefits.  Behavioral modification strategies: increasing lean protein intake, meal planning and cooking strategies, keeping healthy foods in the home, and planning for success.  Alexandra Beck has agreed to follow-up with our clinic in 12 weeks. She was informed of the importance of frequent follow-up visits to maximize her success with intensive lifestyle modifications for her multiple  health conditions.   Alexandra Beck was informed we would discuss her lab results at her next visit unless there is a critical issue that needs to be addressed sooner. Alexandra Beck agreed to keep her next visit at the agreed upon time to discuss these results.  Objective:   Blood pressure (!) 151/89, pulse 74, temperature 98.1 F (36.7 C), height  (1.6 m), weight (!) 441 lb (200 kg),  SpO2 93 %. Body mass index is 78.12 kg/m.  General: Cooperative, alert, well developed, in no acute distress. HEENT: Conjunctivae and lids unremarkable. Cardiovascular: Regular rhythm.  Lungs: Normal work of breathing. Neurologic: No focal deficits.   Lab Results  Component Value Date   CREATININE 1.03 (H) 03/13/2023   BUN 16 03/13/2023   NA 141 03/13/2023   K 4.6 03/13/2023   CL 103 03/13/2023   CO2 21 03/13/2023   Lab Results  Component Value Date   ALT 12 03/13/2023   AST 9 03/13/2023   ALKPHOS 88 03/13/2023   BILITOT 0.2 03/13/2023   Lab Results  Component Value Date   HGBA1C 6.0 (H) 03/13/2023   HGBA1C 5.8 (H) 05/13/2022   HGBA1C 5.9 (H) 11/11/2020   HGBA1C 5.6 12/17/2019   HGBA1C 5.6 07/04/2019   Lab Results  Component Value Date   INSULIN 28.9 (H) 03/13/2023   INSULIN 23.6 11/11/2020   INSULIN 17.1 12/17/2019   INSULIN 23.5 07/04/2019   INSULIN 25.8 (H) 12/04/2018   Lab Results  Component Value Date   TSH 2.040 03/13/2023   Lab Results  Component Value Date   CHOL 176 03/13/2023   HDL 58 03/13/2023   LDLCALC 91 03/13/2023   LDLDIRECT 141.0 03/02/2016   TRIG 158 (H) 03/13/2023   CHOLHDL 3 06/17/2022   Lab Results  Component Value Date   VD25OH 22.3 (L) 03/13/2023   VD25OH 30.2 11/11/2020   VD25OH 38.2 12/17/2019   Lab Results  Component Value Date   WBC 7.2 03/13/2023   HGB 10.9 (L) 03/13/2023   HCT 35.1 03/13/2023   MCV 90 03/13/2023   PLT 209 03/13/2023   Lab Results  Component Value Date   IRON 58 03/13/2023   TIBC 385 03/13/2023   FERRITIN 45 03/13/2023   Attestation Statements:   Reviewed by clinician on day of visit: allergies, medications, problem list, medical history, surgical history, family history, social history, and previous encounter notes.  I, Malcolm Metro, RMA, am acting as transcriptionist for Reuben Likes, MD.  I have reviewed the above documentation for accuracy and completeness, and I agree with the  above. - Reuben Likes, MD

## 2023-03-14 ENCOUNTER — Other Ambulatory Visit (HOSPITAL_COMMUNITY): Payer: Self-pay

## 2023-03-14 LAB — CBC WITH DIFFERENTIAL/PLATELET
Basophils Absolute: 0 10*3/uL (ref 0.0–0.2)
Basos: 0 %
EOS (ABSOLUTE): 0.2 10*3/uL (ref 0.0–0.4)
Eos: 3 %
Hemoglobin: 10.9 g/dL — ABNORMAL LOW (ref 11.1–15.9)
Immature Grans (Abs): 0.1 10*3/uL (ref 0.0–0.1)
Immature Granulocytes: 1 %
Lymphocytes Absolute: 1.2 10*3/uL (ref 0.7–3.1)
Lymphs: 17 %
MCH: 27.9 pg (ref 26.6–33.0)
MCHC: 31.1 g/dL — ABNORMAL LOW (ref 31.5–35.7)
MCV: 90 fL (ref 79–97)
Monocytes Absolute: 0.6 10*3/uL (ref 0.1–0.9)
Monocytes: 8 %
Neutrophils Absolute: 5.1 10*3/uL (ref 1.4–7.0)
Neutrophils: 71 %
Platelets: 209 10*3/uL (ref 150–450)
RBC: 3.9 x10E6/uL (ref 3.77–5.28)
RDW: 15.1 % (ref 11.7–15.4)
WBC: 7.2 10*3/uL (ref 3.4–10.8)

## 2023-03-14 LAB — LIPID PANEL WITH LDL/HDL RATIO
Cholesterol, Total: 176 mg/dL (ref 100–199)
HDL: 58 mg/dL (ref >39)
LDL Chol Calc (NIH): 91 mg/dL (ref 0–99)
LDL/HDL Ratio: 1.6 ratio (ref 0.0–3.2)
Triglycerides: 158 mg/dL — ABNORMAL HIGH (ref 0–149)
VLDL Cholesterol Cal: 27 mg/dL (ref 5–40)

## 2023-03-14 LAB — ANEMIA PANEL
Ferritin: 45 ng/mL (ref 15–150)
Folate, Hemolysate: 432 ng/mL
Folate, RBC: 1231 ng/mL (ref >498)
Hematocrit: 35.1 % (ref 34.0–46.6)
Iron Saturation: 15 % (ref 15–55)
Iron: 58 ug/dL (ref 27–159)
Retic Ct Pct: 1.9 % (ref 0.6–2.6)
Total Iron Binding Capacity: 385 ug/dL (ref 250–450)
UIBC: 327 ug/dL (ref 131–425)
Vitamin B-12: 310 pg/mL (ref 232–1245)

## 2023-03-14 LAB — COMPREHENSIVE METABOLIC PANEL
ALT: 12 IU/L (ref 0–32)
AST: 9 IU/L (ref 0–40)
Albumin/Globulin Ratio: 1.4 (ref 1.2–2.2)
Albumin: 3.8 g/dL — ABNORMAL LOW (ref 3.9–4.9)
Alkaline Phosphatase: 88 IU/L (ref 44–121)
BUN/Creatinine Ratio: 16 (ref 9–23)
BUN: 16 mg/dL (ref 6–24)
Bilirubin Total: 0.2 mg/dL (ref 0.0–1.2)
CO2: 21 mmol/L (ref 20–29)
Calcium: 9.4 mg/dL (ref 8.7–10.2)
Chloride: 103 mmol/L (ref 96–106)
Creatinine, Ser: 1.03 mg/dL — ABNORMAL HIGH (ref 0.57–1.00)
Globulin, Total: 2.8 g/dL (ref 1.5–4.5)
Glucose: 106 mg/dL — ABNORMAL HIGH (ref 70–99)
Potassium: 4.6 mmol/L (ref 3.5–5.2)
Sodium: 141 mmol/L (ref 134–144)
Total Protein: 6.6 g/dL (ref 6.0–8.5)
eGFR: 70 mL/min/{1.73_m2} (ref >59)

## 2023-03-14 LAB — HEMOGLOBIN A1C
Est. average glucose Bld gHb Est-mCnc: 126 mg/dL
Hgb A1c MFr Bld: 6 % — ABNORMAL HIGH (ref 4.8–5.6)

## 2023-03-14 LAB — VITAMIN D 25 HYDROXY (VIT D DEFICIENCY, FRACTURES): Vit D, 25-Hydroxy: 22.3 ng/mL — ABNORMAL LOW (ref 30.0–100.0)

## 2023-03-14 LAB — TSH: TSH: 2.04 u[IU]/mL (ref 0.450–4.500)

## 2023-03-14 LAB — INSULIN, RANDOM: INSULIN: 28.9 u[IU]/mL — ABNORMAL HIGH (ref 2.6–24.9)

## 2023-03-14 MED ORDER — SEMAGLUTIDE (2 MG/DOSE) 8 MG/3ML ~~LOC~~ SOPN
2.0000 mg | PEN_INJECTOR | SUBCUTANEOUS | 0 refills | Status: DC
Start: 2023-03-14 — End: 2023-06-05
  Filled 2023-03-14: qty 3, 28d supply, fill #0
  Filled 2023-04-12: qty 3, 28d supply, fill #1
  Filled 2023-05-15: qty 3, 28d supply, fill #2

## 2023-03-15 ENCOUNTER — Other Ambulatory Visit: Payer: Self-pay

## 2023-03-15 ENCOUNTER — Ambulatory Visit (INDEPENDENT_AMBULATORY_CARE_PROVIDER_SITE_OTHER): Payer: BC Managed Care – PPO | Admitting: Psychology

## 2023-03-15 DIAGNOSIS — F331 Major depressive disorder, recurrent, moderate: Secondary | ICD-10-CM | POA: Diagnosis not present

## 2023-03-15 DIAGNOSIS — F428 Other obsessive-compulsive disorder: Secondary | ICD-10-CM

## 2023-03-16 NOTE — Progress Notes (Signed)
[facility] Counselor/Therapist Progress Note  Patient ID: Alexandra Beck, MRN: [id number],    Date: 03/15/2023  Time Spent: 60 minutes  Treatment Type: Individual Therapy  Reported Symptoms: sadness,   Mental Status Exam: Appearance:  Casual     Behavior: Appropriate  Motor: Normal  Speech/Language:  Normal Rate  Affect: blunted  Mood: sad  Thought process: normal  Thought content:   WNL  Sensory/Perceptual disturbances:   WNL  Orientation: oriented to person, place, time/date, and situation  Attention: Good  Concentration: Good  Memory: WNL  Fund of knowledge:  Good  Insight:   Good  Judgment:  Good  Impulse Control: Good   Risk Assessment: Danger to Self:  No Self-injurious Behavior: No Danger to Others: No Duty to Warn:no Physical Aggression / Violence:No  Access to Firearms a concern: No  Gang Involvement:No   Subjective: The patient attended a face-to-face individual therapy session today via video visit .  The patient gave verbal consent for the video visit to be on WebEx.  The patient was in her home alone and the therapist was in the office.   Patient presents with a blunted affect and mood is sad.  The patient does report that she thinks she is doing a little better than she was with her new medication.  She does not seem to be taking things with her partner as personally as she was and is not crying quite as much.  We continued to talk about this being a good thing and how to maintain positive thoughts and work through issues she may have.  I am hoping that this change in medication will help level her off a little bit more so that she is not giving herself such a hard time about not doing things like her partner wants her to do them. Interventions: Cognitive Behavioral Therapy, Assertiveness/Communication, and Problem solving  Diagnosis:Major depressive disorder, recurrent episode, moderate  Obsessional thoughts  Plan: Treatment  Plan  Strengths/Abilities:  Intelligent, ability for insight, supportive wife  Treatment Preferences:  Outpatient Individual therapy  Statement of Needs:  "I Need help with my depression and motivation. "  Symptoms:Depressed or irritable mood.:(Status: improved). Diminished interest in or  enjoyment of activities.(Status: improved). Feelings of hopelessness,  worthlessness, or inappropriate guilt.: (Status: improved). History of chronic or recurrent depression for which the client has taken antidepressant medication, been hospitalized, had  outpatient treatment, or had a course of electroconvulsive therapy.(Status:  maintained). Lack of energy.: (Status: maintained). Low self-esteem.:  (Status: improved). Poor concentration and indecisiveness.:  (Status: improved). Social withdrawal.:  (Status: maintained).  Unresolved grief issues.: (Status: maintained).  Problems Addressed:  Unipolar Depression  Goals:  LTG:1. Alleviate depressive symptoms and return to previous level of effective  functioning.  60% 2. Appropriately grieve the loss in order to normalize mood and to return  to previously adaptive level of functioning. 3. Develop healthy interpersonal relationships that lead to the alleviation  and help prevent the relapse of depression.  60% 4. Develop healthy thinking patterns and beliefs about self, others, and the world that lead to the alleviation and help prevent the relapse of  Depression  60% STG:1.Describe current and past experiences with depression including their impact on functioning and  attempts to resolve it.  60 % 2. Identify and replace thoughts and beliefs that support depression.  60 % 3.Learn and implement behavioral strategies to overcome depression. 60% 4.Verbalize an understanding of healthy and unhealthy emotions with the intent of increasing the use  of  healthy emotions to guide actions.  40 %  Target Date:  10/08/2023 Frequency: Bi  Weekly Modality:Individual Interventions by Therapist:  CBT, problem solving, EMDR, insight oriented  Patient approved Treatment Plan  Gara Kincade Harrel Lemon, LCSW

## 2023-03-17 ENCOUNTER — Other Ambulatory Visit (HOSPITAL_COMMUNITY): Payer: Self-pay

## 2023-03-17 ENCOUNTER — Other Ambulatory Visit: Payer: Self-pay

## 2023-03-29 ENCOUNTER — Ambulatory Visit (INDEPENDENT_AMBULATORY_CARE_PROVIDER_SITE_OTHER): Payer: BC Managed Care – PPO | Admitting: Psychology

## 2023-03-29 DIAGNOSIS — F331 Major depressive disorder, recurrent, moderate: Secondary | ICD-10-CM

## 2023-03-29 DIAGNOSIS — F428 Other obsessive-compulsive disorder: Secondary | ICD-10-CM

## 2023-03-29 NOTE — Progress Notes (Signed)
[facility] Counselor/Therapist Progress Note  Patient ID: Alexandra Beck, MRN: [id number],    Date: 03/29/2023  Time Spent: 60 minutes  Treatment Type: Individual Therapy  Reported Symptoms: sadness,   Mental Status Exam: Appearance:  Casual     Behavior: Appropriate  Motor: Normal  Speech/Language:  Normal Rate  Affect: blunted  Mood: sad  Thought process: normal  Thought content:   WNL  Sensory/Perceptual disturbances:   WNL  Orientation: oriented to person, place, time/date, and situation  Attention: Good  Concentration: Good  Memory: WNL  Fund of knowledge:  Good  Insight:   Good  Judgment:  Good  Impulse Control: Good   Risk Assessment: Danger to Self:  No Self-injurious Behavior: No Danger to Others: No Duty to Warn:no Physical Aggression / Violence:No  Access to Firearms a concern: No  Gang Involvement:No   Subjective: The patient attended a face-to-face individual therapy session today via video visit .  The patient gave verbal consent for the video visit to be on WebEx.  The patient was in her home alone and the therapist was in the office.   Patient presents with a blunted affect and mood is sad.  The patient continues to feel like she is doing a little better on the medication that she is on.  She talked about a situation at work where she is getting ready to have a performance evaluation and apparently she did not meet her goals and has not met expectations over the last year.  We talked about this possibly being related to the fact that she has depression and that now that we are getting her depression under better control that that may change moving forward.  The patient has not told her wife that she is having problems at work and I recommended that she tell her wife that she is struggling a bit and that she feels hopeful that things will get better because of getting the treatment that she needs.  I educated the patient about the difference  between lack of motivation because someone is lazy and lack of motivation because of depression.  The patient seemed to be in a better head space after we had this conversation.   Interventions: Cognitive Behavioral Therapy, Assertiveness/Communication, and Problem solving  Diagnosis:Major depressive disorder, recurrent episode, moderate (HCC)  Obsessional thoughts  Plan: Treatment Plan  Strengths/Abilities:  Intelligent, ability for insight, supportive wife  Treatment Preferences:  Outpatient Individual therapy  Statement of Needs:  "I Need help with my depression and motivation. "  Symptoms:Depressed or irritable mood.:(Status: improved). Diminished interest in or  enjoyment of activities.(Status: improved). Feelings of hopelessness,  worthlessness, or inappropriate guilt.: (Status: improved). History of chronic or recurrent depression for which the client has taken antidepressant medication, been hospitalized, had  outpatient treatment, or had a course of electroconvulsive therapy.(Status:  maintained). Lack of energy.: (Status: maintained). Low self-esteem.:  (Status: improved). Poor concentration and indecisiveness.:  (Status: improved). Social withdrawal.:  (Status: maintained).  Unresolved grief issues.: (Status: maintained).  Problems Addressed:  Unipolar Depression  Goals:  LTG:1. Alleviate depressive symptoms and return to previous level of effective  functioning.  60% 2. Appropriately grieve the loss in order to normalize mood and to return  to previously adaptive level of functioning. 3. Develop healthy interpersonal relationships that lead to the alleviation  and help prevent the relapse of depression.  60% 4. Develop healthy thinking patterns and beliefs about self, others, and the world that lead to the alleviation and  help prevent the relapse of  Depression  60% STG:1.Describe current and past experiences with depression including their impact on functioning and   attempts to resolve it.  60 % 2. Identify and replace thoughts and beliefs that support depression.  60 % 3.Learn and implement behavioral strategies to overcome depression. 60% 4.Verbalize an understanding of healthy and unhealthy emotions with the intent of increasing the use of  healthy emotions to guide actions.  40 %  Target Date:  10/08/2023 Frequency: Bi Weekly Modality:Individual Interventions by Therapist:  CBT, problem solving, EMDR, insight oriented  Patient approved Treatment Plan  Alexandra Feinberg G Emmons Toth, LCSW                                                                                                                  Alexandra Capek G Prather Failla, LCSW

## 2023-03-30 ENCOUNTER — Other Ambulatory Visit (HOSPITAL_COMMUNITY): Payer: Self-pay

## 2023-03-30 MED ORDER — HYDROCODONE-ACETAMINOPHEN 5-325 MG PO TABS
1.0000 | ORAL_TABLET | ORAL | 0 refills | Status: DC | PRN
  Filled 2023-03-30: qty 12, 2d supply, fill #0

## 2023-03-30 NOTE — Progress Notes (Deleted)
  TeleHealth Visit:  This visit was completed with telemedicine (audio/video) technology. Liza has verbally consented to this TeleHealth visit. The patient is located at home, the provider is located at home. The participants in this visit include the listed provider and patient. The visit was conducted today via MyChart video.  OBESITY Janiene is here to discuss her progress with her obesity treatment plan along with follow-up of her obesity related diagnoses.   Today's visit was # 73 Starting weight: 468 lbs Starting date: 04/17/18 Weight at last in office visit: 441 lbs on 03/13/23 Total weight loss: 27 lbs at last in office visit on 03/13/23. Today's reported weight (***): {dwwweightreported:29243}  Nutrition Plan: practicing portion control and making smarter food choices, such as increasing vegetables and decreasing simple carbohydrates - ***% adherence.  Current exercise: {exercise types:16438} none  Interim History:  *** GO OVER  Eating all of the prescribed protein: {yes***/no:17258} Skipping meals: {dwwyes:29172} Drinking adequate water: {dwwyes:29172} Drinking sugar sweetened beverages: {dwwyes:29172} Hunger controlled: {EWCONTROLASSESSMENT:24261}. Cravings controlled:  {EWCONTROLASSESSMENT:24261}.  Journaling Consistently:  {dwwyes:29172} Meeting protein goals:  {dwwyes:29172} Meeting calorie goals:  {dwwyes:29172}   Pharmacotherapy: Margrit is on {dwwpharmacotherapy:29109} Adverse side effects: {dwwse:29122} Hunger is {EWCONTROLASSESSMENT:24261}.  Cravings are {EWCONTROLASSESSMENT:24261}.  Assessment/Plan:  We discussed recent lab results in depth.  1. ***  2. ***  3. ***  {dwwmorbid:29108::"Morbid Obesity"}: Current BMI ***  Pharmacotherapy Plan {dwwmed:29123}  {dwwpharmacotherapy:29109}  Arlisha {CHL AMB IS/IS NOT:210130109} currently in the action stage of change. As such, her goal is to {MWMwtloss#1:210800005}.  She has agreed to  {dwwsldiets:29085}.  Exercise goals: {MWM EXERCISE RECS:23473}  Behavioral modification strategies: {dwwslwtlossstrategies:29088}.  Brissa has agreed to follow-up with our clinic in {NUMBER 1-10:22536} weeks.   No orders of the defined types were placed in this encounter.   There are no discontinued medications.   No orders of the defined types were placed in this encounter.     Objective:   VITALS: Per patient if applicable, see vitals. GENERAL: Alert and in no acute distress. CARDIOPULMONARY: No increased WOB. Speaking in clear sentences.  PSYCH: Pleasant and cooperative. Speech normal rate and rhythm. Affect is appropriate. Insight and judgement are appropriate. Attention is focused, linear, and appropriate.  NEURO: Oriented as arrived to appointment on time with no prompting.   Attestation Statements:   Reviewed by clinician on day of visit: allergies, medications, problem list, medical history, surgical history, family history, social history, and previous encounter notes.  ***(delete if time-based billing not used) Time spent on visit including the items listed below was *** minutes.  -preparing to see the patient (e.g., review of tests, history, previous notes) -obtaining and/or reviewing separately obtained history -counseling and educating the patient/family/caregiver -documenting clinical information in the electronic or other health record -ordering medications, tests, or procedures -independently interpreting results and communicating results to the patient/ family/caregiver -referring and communicating with other health care professionals  -care coordination   This was prepared with the assistance of Engineer, civil (consulting).  Occasional wrong-word or sound-a-like substitutions may have occurred due to the inherent limitations of voice recognition software.

## 2023-03-31 ENCOUNTER — Other Ambulatory Visit (HOSPITAL_COMMUNITY): Payer: Self-pay

## 2023-03-31 ENCOUNTER — Encounter: Payer: Self-pay | Admitting: Adult Health

## 2023-03-31 ENCOUNTER — Other Ambulatory Visit: Payer: Self-pay

## 2023-03-31 ENCOUNTER — Telehealth (INDEPENDENT_AMBULATORY_CARE_PROVIDER_SITE_OTHER): Payer: BC Managed Care – PPO | Admitting: Adult Health

## 2023-03-31 DIAGNOSIS — F428 Other obsessive-compulsive disorder: Secondary | ICD-10-CM | POA: Diagnosis not present

## 2023-03-31 DIAGNOSIS — G4700 Insomnia, unspecified: Secondary | ICD-10-CM | POA: Diagnosis not present

## 2023-03-31 DIAGNOSIS — F331 Major depressive disorder, recurrent, moderate: Secondary | ICD-10-CM | POA: Diagnosis not present

## 2023-03-31 MED ORDER — LAMOTRIGINE 100 MG PO TABS
100.0000 mg | ORAL_TABLET | Freq: Every day | ORAL | 2 refills | Status: DC
Start: 2023-03-31 — End: 2023-04-28
  Filled 2023-03-31: qty 30, 30d supply, fill #0

## 2023-03-31 NOTE — Progress Notes (Signed)
Alexandra Beck [id number] [birth year] 42 y.o.  Virtual Visit via Video Note  I connected with pt @ on 03/31/23 at  3:00 PM EDT by a video enabled telemedicine application and verified that I am speaking with the correct person using two identifiers.   I discussed the limitations of evaluation and management by telemedicine and the availability of in person appointments. The patient expressed understanding and agreed to proceed.  I discussed the assessment and treatment plan with the patient. The patient was provided an opportunity to ask questions and all were answered. The patient agreed with the plan and demonstrated an understanding of the instructions.   The patient was advised to call back or seek an in-person evaluation if the symptoms worsen or if the condition fails to improve as anticipated.  I provided 15 minutes of non-face-to-face time during this encounter.  The patient was located at home.  The provider was located at [facility].   Alexandra Gibbs, NP   Subjective:   Patient ID:  Alexandra Beck is a 42 y.o. (DOB 1981/05/31) female.  Chief Complaint: No chief complaint on file.   HPI Alexandra Beck presents for follow-up of  insomnia, obsessional thoughts and depression.  Describes mood today as "not good". Pleasant. Decreased tearfulness. Mood symptoms - reports decreased depression, anxiety, and irritability. Reports decreased worry, rumination, and over thinking. Denies obsessive thoughts or acts. Denies intrusive thoughts. Mood is improving. Stating "I'm feeling a little bit better". Feels like the addition of Lamictal has been helpful, but reports some excessive sleepiness. Falling asleep at work. Feeling drowsy. Seeing therapist - Alexandra Beck for therapy. Varying interest and motivation. Taking medications as prescribed. Energy levels lower. Active, does not have a regular exercise routine.  Enjoys some usual interests and activities. Married. Lives with Alexandra Beck  and 3 cats. Spending time with family. Gaming online with friends. Appetite adequate. Taking Ozempic. Weight loss. Working with a weight management clinic.  Sleeping well most nights. Averages 7 to 8 hours. Focus and concentration stable. Completing tasks. Managing minimal aspects of household. Works 12 to 9 - at [employer].  Denies SI or HI.  Denies AH or VH. Denies self harm. Denies substance use. Seeing Alexandra Beck for therapy.  Previous medication trials: Trialed on Wellbutrin at age 51, Trintellix-weird dreams, Latuda  Review of Systems:  Review of Systems  Musculoskeletal:  Negative for gait problem.  Neurological:  Negative for tremors.  Psychiatric/Behavioral:         Please refer to HPI    Medications: I have reviewed the patient's current medications.  Current Outpatient Medications  Medication Sig Dispense Refill   lamoTRIgine (LAMICTAL) 100 MG tablet Take 1 tablet (100 mg total) by mouth daily. 30 tablet 2   acetaminophen (TYLENOL) 500 MG tablet Take 1,000-2,000 mg by mouth every 6 (six) hours as needed for mild pain.     cefadroxil (DURICEF) 500 MG capsule Take 1 capsule by mouth 2 times daily. 14 capsule 3   HYDROcodone-acetaminophen (NORCO/VICODIN) 5-325 MG tablet Take 1-2 tablets by mouth every 4 (four) hours as needed for dental pain.  Do not exceed 8 tablets per day.  12 tablet 0   ibuprofen (ADVIL) 200 MG tablet Take 800 mg by mouth every 6 (six) hours as needed for headache or moderate pain.     iron polysaccharides (FERREX 150) 150 MG capsule Take 1 capsule (150 mg total) by mouth daily. 90 capsule 1   levothyroxine (SYNTHROID) 150 MCG tablet  Take 1 tablet (150 mcg total) by mouth daily before breakfast. 90 tablet 3   losartan (COZAAR) 100 MG tablet Take 1 tablet (100 mg total) by mouth daily. 90 tablet 3   metFORMIN (GLUCOPHAGE) 500 MG tablet Take 1 tablet (500 mg total) by mouth daily. 90 tablet 3   metoprolol succinate (TOPROL-XL) 50 MG 24 hr tablet Take  1 tablet (50 mg total) by mouth daily. 90 tablet 3   modafinil (PROVIGIL) 100 MG tablet Take 100mg  tablet by mouth daily at 9am (along with 200 mg tablet daily at 9am for a total of 300 mg daily) 60 tablet 3   modafinil (PROVIGIL) 200 MG tablet Take 1 tablet by mouth daily at 9am (along with 100 mg tablet daily at 9am (total 300mg )) 60 tablet 3   rosuvastatin (CRESTOR) 10 MG tablet Take 1 tablet (10 mg total) by mouth daily. 90 tablet 3   Semaglutide, 2 MG/DOSE, 8 MG/3ML SOPN Inject 2 mg into the skin as directed once a week. 9 mL 0   sertraline (ZOLOFT) 100 MG tablet Take 2 tablets (200 mg total) by mouth daily. 180 tablet 3   spironolactone (ALDACTONE) 25 MG tablet Take 1 tablet (25 mg total) by mouth daily. 90 tablet 3   torsemide (DEMADEX) 20 MG tablet Take 4 tablets (80 mg total) by mouth 2 (two) times daily as needed. 720 tablet 3   traMADol (ULTRAM) 50 MG tablet Take 1 tablet (50 mg total) by mouth every 8 (eight) hours as needed. 40 tablet 0   triamcinolone cream (KENALOG) 0.1 % Apply 1 Application topically 2 (two) times daily. 30 g 3   No current facility-administered medications for this visit.    Medication Side Effects: None  Allergies:  Allergies  Allergen Reactions   Aspirin Hives and Swelling    REACTION: throat swelling, hives Other reaction(s): Unknown Other reaction(s): Unknown   Doxycycline Other (See Comments)    Abdominal pain, difficulty swallowing   Lisinopril Cough   Niacin And Related     Other reaction(s): Unknown Other reaction(s): Unknown   Niaspan [Niacin Er]     Caused flushing    Past Medical History:  Diagnosis Date   Acute renal failure (ARF) (HCC) 11/2012    multifactorial-likely secondary to ATN in the setting of sepsis and hypotension, and also from rhabdomyolysis   Anemia    Cellulitis    Chronic diastolic CHF (congestive heart failure) (HCC)    Depression    Diabetes mellitus (HCC)    GERD (gastroesophageal reflux disease)     Hyperlipidemia    Hypertension    Hypothyroidism    Morbid obesity with BMI of 70 and over, adult (HCC)    Obesity hypoventilation syndrome (HCC)    OSA (obstructive sleep apnea)    PVC's (premature ventricular contractions)    Recurrent cellulitis of lower leg    LLE, venous insuff   Sepsis (HCC) 11/2012   Secondary to cellulitis   Sleep apnea    Venous insufficiency of leg     Family History  Problem Relation Age of Onset   Hypertension Mother    Diabetes Mother    High Cholesterol Mother    Thyroid disease Mother    Sleep apnea Mother    Obesity Mother    Hypertension Father    Heart disease Father        before age 69   High Cholesterol Father    Sleep apnea Father    Obesity Father  Social History   Socioeconomic History   Marital status: Single    Spouse name: Not on file   Number of children: 0   Years of education: Not on file   Highest education level: Not on file  Occupational History   Occupation: Librarian    Employer: [employer]  Tobacco Use   Smoking status: Former    Packs/day: 0.50    Years: 8.00    Additional pack years: 0.00    Total pack years: 4.00    Types: Cigarettes    Quit date: 2011    Years since quitting: 13.3   Smokeless tobacco: Never  Vaping Use   Vaping Use: Never used  Substance and Sexual Activity   Alcohol use: No    Alcohol/week: 0.0 standard drinks of alcohol   Drug use: No   Sexual activity: Not on file  Other Topics Concern   Not on file  Social History Narrative   Librarian, 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    Please see review of systems for further details on the patient's review from today.   Objective:   Physical Exam:  There were no vitals taken for this visit.  Physical Exam Constitutional:      General: She is not in acute distress. Musculoskeletal:        General: No deformity.  Neurological:     Mental Status: She is alert and oriented to person, place, and time.     Coordination: Coordination normal.  Psychiatric:        Attention and Perception: Attention and perception normal. She does not perceive auditory or visual hallucinations.        Mood and Affect: Mood normal. Mood is not anxious or depressed. Affect is not labile, blunt, angry or inappropriate.        Speech: Speech normal.        Behavior: Behavior normal.        Thought Content: Thought content normal. Thought content is not paranoid or delusional. Thought content does not include homicidal or suicidal ideation. Thought content does not include homicidal or suicidal plan.        Cognition and Memory: Cognition and memory normal.        Judgment: Judgment normal.     Comments: Insight intact     Lab Review:     Component Value Date/Time   NA 141 03/13/2023 1511   K 4.6 03/13/2023 1511   CL 103 03/13/2023 1511   CO2 21 03/13/2023 1511   GLUCOSE 106 (H) 03/13/2023 1511   GLUCOSE 108 (H) 09/23/2022 1134   BUN 16 03/13/2023 1511   CREATININE 1.03 (H) 03/13/2023 1511   CREATININE 1.06 11/14/2016 1050   CALCIUM 9.4 03/13/2023 1511   CALCIUM 9.6 06/26/2007 0000   PROT 6.6 03/13/2023 1511   ALBUMIN 3.8 (L) 03/13/2023 1511   AST 9 03/13/2023 1511   ALT 12 03/13/2023 1511   ALKPHOS 88 03/13/2023 1511   BILITOT 0.2 03/13/2023 1511   GFRNONAA >60 05/17/2022 0351   GFRAA 81 11/11/2020 1211  Component Value Date/Time   WBC 7.2 03/13/2023 1511   WBC 7.8 09/23/2022 1134   RBC 3.90 03/13/2023 1511   RBC 4.24 09/23/2022 1134   HGB 10.9 (L) 03/13/2023 1511   HCT 35.1 03/13/2023 1511   PLT 209  03/13/2023 1511   MCV 90 03/13/2023 1511   MCH 27.9 03/13/2023 1511   MCH 26.4 05/17/2022 0351   MCHC 31.1 (L) 03/13/2023 1511   MCHC 31.6 09/23/2022 1134   RDW 15.1 03/13/2023 1511   LYMPHSABS 1.2 03/13/2023 1511   MONOABS 0.6 09/23/2022 1134   EOSABS 0.2 03/13/2023 1511   BASOSABS 0.0 03/13/2023 1511    No results found for: "POCLITH", "LITHIUM"   No results found for: "PHENYTOIN", "PHENOBARB", "VALPROATE", "CBMZ"   .res Assessment: Plan:    Plan:   PDMP reviewed  Zoloft 100mg  daily - take two tablets daily  Increase Lamictal 50mg  to 100mg  at hs  RTC 3 months  Patient advised to contact office with any questions, adverse effects, or acute worsening in signs and symptoms.  Time spent with patient was 20 minutes. Greater than 50% of face to face time with patient was spent on counseling and coordination of care.    Discussed potential metabolic side effects associated with atypical antipsychotics, as well as potential risk for movement side effects. Advised pt to contact office if movement side effects occur.  Diagnoses and all orders for this visit:  Major depressive disorder, recurrent episode, moderate (HCC) -     lamoTRIgine (LAMICTAL) 100 MG tablet; Take 1 tablet (100 mg total) by mouth daily.     Please see After Visit Summary for patient specific instructions.  Future Appointments  Date Time Provider Department Center  04/03/2023 12:00 PM Whit[place]ire, Thermon Leyland, [place] [facility] None  04/12/2023  1:00 PM Beck, Alexandra G, LCSW [facility] None  04/26/2023  1:00 PM Beck, Alexandra G, LCSW [facility] None  05/10/2023  1:00 PM Beck, Alexandra G, LCSW [facility] None  05/24/2023  1:00 PM Beck, Alexandra G, LCSW [facility] None  06/05/2023 12:40 PM Langston Reusing, MD [facility] None  06/23/2023  2:00 PM Corwin Levins, MD [facility] None    No orders of the defined types were placed in this encounter.     -------------------------------

## 2023-04-03 ENCOUNTER — Telehealth (INDEPENDENT_AMBULATORY_CARE_PROVIDER_SITE_OTHER): Payer: BC Managed Care – PPO | Admitting: Family Medicine

## 2023-04-03 DIAGNOSIS — Z6841 Body Mass Index (BMI) 40.0 and over, adult: Secondary | ICD-10-CM

## 2023-04-06 ENCOUNTER — Other Ambulatory Visit (HOSPITAL_COMMUNITY): Payer: Self-pay

## 2023-04-12 ENCOUNTER — Encounter: Payer: Self-pay | Admitting: Internal Medicine

## 2023-04-12 ENCOUNTER — Ambulatory Visit (INDEPENDENT_AMBULATORY_CARE_PROVIDER_SITE_OTHER): Payer: BC Managed Care – PPO | Admitting: Psychology

## 2023-04-12 ENCOUNTER — Other Ambulatory Visit: Payer: Self-pay | Admitting: Internal Medicine

## 2023-04-12 ENCOUNTER — Other Ambulatory Visit: Payer: Self-pay

## 2023-04-12 DIAGNOSIS — F428 Other obsessive-compulsive disorder: Secondary | ICD-10-CM | POA: Diagnosis not present

## 2023-04-12 DIAGNOSIS — F331 Major depressive disorder, recurrent, moderate: Secondary | ICD-10-CM

## 2023-04-12 MED ORDER — TRAMADOL HCL 50 MG PO TABS
50.0000 mg | ORAL_TABLET | Freq: Three times a day (TID) | ORAL | 1 refills | Status: DC | PRN
  Filled 2023-04-12: qty 40, 14d supply, fill #0

## 2023-04-12 MED ORDER — TRIAMCINOLONE ACETONIDE 0.1 % EX CREA
1.0000 | TOPICAL_CREAM | Freq: Two times a day (BID) | CUTANEOUS | 1 refills | Status: AC
  Filled 2023-04-12: qty 30, 15d supply, fill #0
  Filled 2023-05-25: qty 30, 15d supply, fill #1

## 2023-04-12 NOTE — Progress Notes (Signed)
                Gilliam Hawkes G Ceasia Elwell, LCSW

## 2023-04-13 ENCOUNTER — Other Ambulatory Visit: Payer: Self-pay

## 2023-04-13 ENCOUNTER — Other Ambulatory Visit (HOSPITAL_COMMUNITY): Payer: Self-pay

## 2023-04-26 ENCOUNTER — Ambulatory Visit: Payer: BC Managed Care – PPO | Admitting: Psychology

## 2023-04-26 ENCOUNTER — Ambulatory Visit (INDEPENDENT_AMBULATORY_CARE_PROVIDER_SITE_OTHER): Payer: BC Managed Care – PPO | Admitting: Psychology

## 2023-04-26 DIAGNOSIS — F428 Other obsessive-compulsive disorder: Secondary | ICD-10-CM | POA: Diagnosis not present

## 2023-04-26 DIAGNOSIS — F331 Major depressive disorder, recurrent, moderate: Secondary | ICD-10-CM

## 2023-04-26 NOTE — Progress Notes (Signed)
                Bernise Sylvain G Adisen Bennion, LCSW 

## 2023-04-28 ENCOUNTER — Encounter: Payer: Self-pay | Admitting: Adult Health

## 2023-04-28 ENCOUNTER — Telehealth (INDEPENDENT_AMBULATORY_CARE_PROVIDER_SITE_OTHER): Payer: BC Managed Care – PPO | Admitting: Adult Health

## 2023-04-28 ENCOUNTER — Other Ambulatory Visit: Payer: Self-pay

## 2023-04-28 DIAGNOSIS — F428 Other obsessive-compulsive disorder: Secondary | ICD-10-CM | POA: Diagnosis not present

## 2023-04-28 DIAGNOSIS — G4700 Insomnia, unspecified: Secondary | ICD-10-CM | POA: Diagnosis not present

## 2023-04-28 DIAGNOSIS — F331 Major depressive disorder, recurrent, moderate: Secondary | ICD-10-CM

## 2023-04-28 MED ORDER — LAMOTRIGINE 150 MG PO TABS
150.0000 mg | ORAL_TABLET | Freq: Every day | ORAL | 2 refills | Status: DC
Start: 2023-04-28 — End: 2023-07-28
  Filled 2023-04-28: qty 30, 30d supply, fill #0
  Filled 2023-05-25: qty 30, 30d supply, fill #1
  Filled 2023-06-28: qty 30, 30d supply, fill #2

## 2023-04-28 NOTE — Progress Notes (Signed)
Alexandra Beck [id number] [birth year] 42 y.o.  Virtual Visit via Video Note  I connected with pt @ on 04/28/23 at  2:00 PM EDT by a video enabled telemedicine application and verified that I am speaking with the correct person using two identifiers.   I discussed the limitations of evaluation and management by telemedicine and the availability of in person appointments. The patient expressed understanding and agreed to proceed.  I discussed the assessment and treatment plan with the patient. The patient was provided an opportunity to ask questions and all were answered. The patient agreed with the plan and demonstrated an understanding of the instructions.   The patient was advised to call back or seek an in-person evaluation if the symptoms worsen or if the condition fails to improve as anticipated.  I provided 20 minutes of non-face-to-face time during this encounter.  The patient was located at home.  The provider was located at [facility].   Alexandra Gibbs, NP   Subjective:   Patient ID:  Alexandra Beck is a 42 y.o. (DOB [birth year]) female.  Chief Complaint: No chief complaint on file.   HPI LORICE GUADAGNOLI presents for follow-up of insomnia, obsessional thoughts and depression.  Describes mood today as "better". Pleasant. Decreased tearfulness. Mood symptoms - denies   depression, anxiety, and irritability. Denies worry, rumination, and over thinking. Denies obsessive thoughts or acts. Denies intrusive thoughts. Mood has improved. Stating "I feel like I'm doing better". Feels like the Lamictal is helpful. Seeing therapist - Bambi Cottle for therapy. Varying interest and motivation. Taking medications as prescribed. Energy levels lower. Active, does not have a regular exercise routine.  Enjoys some usual interests and activities. Married. Lives with wife and 3 cats. Spending time with family. Gaming online with friends. Appetite adequate. Weight loss. Taking  Ozempic. Sleeping well most nights. Averages 7 to 8 hours. Focus and concentration stable. Completing tasks. Managing minimal aspects of household. Works 12 to 9 - at [employer].  Denies SI or HI.  Denies AH or VH. Denies self harm. Denies substance use. Seeing Bambi Cottle for therapy.  Previous medication trials: Trialed on Wellbutrin at age 52, Trintellix-weird dreams, Latuda   Review of Systems:  Review of Systems  Musculoskeletal:  Negative for gait problem.  Neurological:  Negative for tremors.  Psychiatric/Behavioral:         Please refer to HPI    Medications: I have reviewed the patient's current medications.  Current Outpatient Medications  Medication Sig Dispense Refill   acetaminophen (TYLENOL) 500 MG tablet Take 1,000-2,000 mg by mouth every 6 (six) hours as needed for mild pain.     cefadroxil (DURICEF) 500 MG capsule Take 1 capsule by mouth 2 times daily. 14 capsule 3   HYDROcodone-acetaminophen (NORCO/VICODIN) 5-325 MG tablet Take 1-2 tablets by mouth every 4 (four) hours as needed for dental pain.  Do not exceed 8 tablets per day.  12 tablet 0   ibuprofen (ADVIL) 200 MG tablet Take 800 mg by mouth every 6 (six) hours as needed for headache or moderate pain.     iron polysaccharides (FERREX 150) 150 MG capsule Take 1 capsule (150 mg total) by mouth daily. 90 capsule 1   lamoTRIgine (LAMICTAL) 150 MG tablet Take 1 tablet (150 mg total) by mouth daily. 30 tablet 2   levothyroxine (SYNTHROID) 150 MCG tablet Take 1 tablet (150 mcg total) by mouth daily before breakfast. 90 tablet 3   losartan (COZAAR) 100 MG tablet Take  1 tablet (100 mg total) by mouth daily. 90 tablet 3   metFORMIN (GLUCOPHAGE) 500 MG tablet Take 1 tablet (500 mg total) by mouth daily. 90 tablet 3   metoprolol succinate (TOPROL-XL) 50 MG 24 hr tablet Take 1 tablet (50 mg total) by mouth daily. 90 tablet 3   modafinil (PROVIGIL) 100 MG tablet Take 100mg  tablet by mouth daily at 9am (along with 200  mg tablet daily at 9am for a total of 300 mg daily) 60 tablet 3   modafinil (PROVIGIL) 200 MG tablet Take 1 tablet by mouth daily at 9am (along with 100 mg tablet daily at 9am (total 300mg )) 60 tablet 3   rosuvastatin (CRESTOR) 10 MG tablet Take 1 tablet (10 mg total) by mouth daily. 90 tablet 3   Semaglutide, 2 MG/DOSE, 8 MG/3ML SOPN Inject 2 mg into the skin as directed once a week. 9 mL 0   sertraline (ZOLOFT) 100 MG tablet Take 2 tablets (200 mg total) by mouth daily. 180 tablet 3   spironolactone (ALDACTONE) 25 MG tablet Take 1 tablet (25 mg total) by mouth daily. 90 tablet 3   torsemide (DEMADEX) 20 MG tablet Take 4 tablets (80 mg total) by mouth 2 (two) times daily as needed. 720 tablet 3   traMADol (ULTRAM) 50 MG tablet Take 1 tablet (50 mg total) by mouth every 8 (eight) hours as needed. 40 tablet 1   triamcinolone cream (KENALOG) 0.1 % Apply 1 Application topically 2 (two) times daily. 30 g 1   No current facility-administered medications for this visit.    Medication Side Effects: None  Allergies:  Allergies  Allergen Reactions   Aspirin Hives and Swelling    REACTION: throat swelling, hives Other reaction(s): Unknown Other reaction(s): Unknown   Doxycycline Other (See Comments)    Abdominal pain, difficulty swallowing   Lisinopril Cough   Niacin And Related     Other reaction(s): Unknown Other reaction(s): Unknown   Niaspan [Niacin Er]     Caused flushing    Past Medical History:  Diagnosis Date   Acute renal failure (ARF) (HCC) 11/2012    multifactorial-likely secondary to ATN in the setting of sepsis and hypotension, and also from rhabdomyolysis   Anemia    Cellulitis    Chronic diastolic CHF (congestive heart failure) (HCC)    Depression    Diabetes mellitus (HCC)    GERD (gastroesophageal reflux disease)    Hyperlipidemia    Hypertension    Hypothyroidism    Morbid obesity with BMI of 70 and over, adult (HCC)    Obesity hypoventilation syndrome (HCC)     OSA (obstructive sleep apnea)    PVC's (premature ventricular contractions)    Recurrent cellulitis of lower leg    LLE, venous insuff   Sepsis (HCC) 11/2012   Secondary to cellulitis   Sleep apnea    Venous insufficiency of leg     Family History  Problem Relation Age of Onset   Hypertension Mother    Diabetes Mother    High Cholesterol Mother    Thyroid disease Mother    Sleep apnea Mother    Obesity Mother    Hypertension Father    Heart disease Father        before age 70   High Cholesterol Father    Sleep apnea Father    Obesity Father     Social History   Socioeconomic History   Marital status: Single    Spouse name: Not on file  Number of children: 0   Years of education: Not on file   Highest education level: Not on file  Occupational History   Occupation: Librarian    Employer: [employer]  Tobacco Use   Smoking status: Former    Packs/day: 0.50    Years: 8.00    Additional pack years: 0.00    Total pack years: 4.00    Types: Cigarettes    Quit date: 2011    Years since quitting: 13.4   Smokeless tobacco: Never  Vaping Use   Vaping Use: Never used  Substance and Sexual Activity   Alcohol use: No    Alcohol/week: 0.0 standard drinks of alcohol   Drug use: No   Sexual activity: Not on file  Other Topics Concern   Not on file  Social History Narrative   Librarian, 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   Please see review of systems for further details on the patient's review from today.   Objective:   Physical Exam:  There were no vitals taken for  this visit.  Physical Exam Constitutional:      General: She is not in acute distress. Musculoskeletal:        General: No deformity.  Neurological:     Mental Status: She is alert and oriented to person, place, and time.     Coordination: Coordination normal.  Psychiatric:        Attention and Perception: Attention and perception normal. She does not perceive auditory or visual hallucinations.        Mood and Affect: Mood normal. Mood is not anxious or depressed. Affect is not labile, blunt, angry or inappropriate.        Speech: Speech normal.        Behavior: Behavior normal.        Thought Content: Thought content normal. Thought content is not paranoid or delusional. Thought content does not include homicidal or suicidal ideation. Thought content does not include homicidal or suicidal plan.        Cognition and Memory: Cognition and memory normal.        Judgment: Judgment normal.     Comments: Insight intact     Lab Review:     Component Value Date/Time   NA 141 03/13/2023 1511   K 4.6 03/13/2023 1511   CL 103 03/13/2023 1511   CO2 21 03/13/2023 1511   GLUCOSE 106 (H) 03/13/2023 1511   GLUCOSE 108 (H) 09/23/2022 1134   BUN 16 03/13/2023 1511   CREATININE 1.03 (H) 03/13/2023 1511   CREATININE 1.06 11/14/2016 1050   CALCIUM 9.4 03/13/2023 1511   CALCIUM 9.6 06/26/2007 0000   PROT 6.6 03/13/2023 1511   ALBUMIN 3.8 (L) 03/13/2023 1511   AST 9 03/13/2023 1511   ALT 12 03/13/2023 1511   ALKPHOS 88 03/13/2023 1511   BILITOT 0.2 03/13/2023 1511   GFRNONAA >60 05/17/2022 0351   GFRAA 81 11/11/2020 1211       Component Value Date/Time   WBC 7.2 03/13/2023 1511   WBC 7.8 09/23/2022 1134   RBC 3.90  03/13/2023 1511   RBC 4.24 09/23/2022 1134   HGB 10.9 (L) 03/13/2023 1511   HCT 35.1 03/13/2023 1511   PLT 209 03/13/2023 1511   MCV 90 03/13/2023 1511   MCH 27.9 03/13/2023 1511   MCH 26.4 05/17/2022 0351   MCHC 31.1 (L) 03/13/2023 1511   MCHC 31.6 09/23/2022 1134    RDW 15.1 03/13/2023 1511   LYMPHSABS 1.2 03/13/2023 1511   MONOABS 0.6 09/23/2022 1134   EOSABS 0.2 03/13/2023 1511   BASOSABS 0.0 03/13/2023 1511    No results found for: "POCLITH", "LITHIUM"   No results found for: "PHENYTOIN", "PHENOBARB", "VALPROATE", "CBMZ"   .res Assessment: Plan:    Plan:   PDMP reviewed  Zoloft 100mg  daily - take two tablets daily  Increase Lamictal 100mg  to 150mg  at hs  RTC 3 months  Patient advised to contact office with any questions, adverse effects, or acute worsening in signs and symptoms.  Time spent with patient was 20 minutes. Greater than 50% of face to face time with patient was spent on counseling and coordination of care.    Discussed potential metabolic side effects associated with atypical antipsychotics, as well as potential risk for movement side effects. Advised pt to contact office if movement side effects occur.  Diagnoses and all orders for this visit:  Major depressive disorder, recurrent episode, moderate (HCC) -     lamoTRIgine (LAMICTAL) 150 MG tablet; Take 1 tablet (150 mg total) by mouth daily.  Obsessional thoughts  Insomnia, unspecified type     Please see After Visit Summary for patient specific instructions.  Future Appointments  Date Time Provider Department Center  05/10/2023  1:00 PM Cottle, Lynnell Dike, LCSW [facility] None  05/24/2023  1:00 PM Cottle, Bambi G, LCSW [facility] None  06/05/2023 12:40 PM Langston Reusing, MD [facility] None  06/07/2023  1:00 PM Cottle, Bambi G, LCSW [facility] None  06/21/2023  1:00 PM Cottle, Bambi G, LCSW [facility] None  06/23/2023  2:00 PM Corwin Levins, MD [facility] None    No orders of the defined types were placed in this encounter.     -------------------------------

## 2023-04-29 ENCOUNTER — Other Ambulatory Visit (HOSPITAL_COMMUNITY): Payer: Self-pay

## 2023-05-01 ENCOUNTER — Other Ambulatory Visit: Payer: Self-pay

## 2023-05-10 ENCOUNTER — Ambulatory Visit (INDEPENDENT_AMBULATORY_CARE_PROVIDER_SITE_OTHER): Payer: BC Managed Care – PPO | Admitting: Psychology

## 2023-05-10 DIAGNOSIS — F428 Other obsessive-compulsive disorder: Secondary | ICD-10-CM

## 2023-05-10 DIAGNOSIS — F331 Major depressive disorder, recurrent, moderate: Secondary | ICD-10-CM | POA: Diagnosis not present

## 2023-05-10 NOTE — Progress Notes (Signed)
[facility] Counselor/Therapist Progress Note  Patient ID: Alexandra Beck, MRN: [id number],    Date: 05/10/2023  Time Spent: 60 minutes  Treatment Type: Individual Therapy  Reported Symptoms: sadness,   Mental Status Exam: Appearance:  Casual     Behavior: Appropriate  Motor: Normal  Speech/Language:  Normal Rate  Affect: blunted  Mood: pleasant  Thought process: normal  Thought content:   WNL  Sensory/Perceptual disturbances:   WNL  Orientation: oriented to person, place, time/date, and situation  Attention: Good  Concentration: Good  Memory: WNL  Fund of knowledge:  Good  Insight:   Good  Judgment:  Good  Impulse Control: Good   Risk Assessment: Danger to Self:  No Self-injurious Behavior: No Danger to Others: No Duty to Warn:no Physical Aggression / Violence:No  Access to Firearms a concern: No  Gang Involvement:No   Subjective: The patient attended a face-to-face individual therapy session today via video visit .  The patient gave verbal consent for the video visit to be on Caregility and the patient is aware of the limitations of telehealth.  The patient was in her home alone and the therapist was in the office.   Patient presents with a blunted affect and mood is pleasant.  The patient reports that she does think her medication is helping her.  She does not seem to be as depressed or as tearful.  She is not as self-deprecating either.  We talked about what is going on in her life and it seems that her wife is going through a situation that the patient similarly went through a year ago.  We processed how the patient felt about it and it seems that she is actually giving her wife a lot of good feedback in relation to the situation with a gentleman at work.  We talked about the patient continuing to take care of herself and she reports that she did have a little bit of a stirring and talking about the circumstance that she was involved in last year and it  made her marriage those interactions with that person.  We talked about her making a choice to continue to be supportive of her her wife and not focus too much on that former relationship.  The patient is making good progress. Interventions: Cognitive Behavioral Therapy, Assertiveness/Communication, and Problem solving  Diagnosis:Major depressive disorder, recurrent episode, moderate (HCC)  Obsessional thoughts  Plan: Treatment Plan  Strengths/Abilities:  Intelligent, ability for insight, supportive wife  Treatment Preferences:  Outpatient Individual therapy  Statement of Needs:  "I Need help with my depression and motivation. "  Symptoms:Depressed or irritable mood.:(Status: improved). Diminished interest in or  enjoyment of activities.(Status: improved). Feelings of hopelessness,  worthlessness, or inappropriate guilt.: (Status: improved). History of chronic or recurrent depression for which the client has taken antidepressant medication, been hospitalized, had  outpatient treatment, or had a course of electroconvulsive therapy.(Status:  maintained). Lack of energy.: (Status: maintained). Low self-esteem.:  (Status: improved). Poor concentration and indecisiveness.:  (Status: improved). Social withdrawal.:  (Status: maintained).  Unresolved grief issues.: (Status: maintained).  Problems Addressed:  Unipolar Depression  Goals:  LTG:1. Alleviate depressive symptoms and return to previous level of effective  functioning.  70% 2. Appropriately grieve the loss in order to normalize mood and to return  to previously adaptive level of functioning. 3. Develop healthy interpersonal relationships that lead to the alleviation  and help prevent the relapse of depression.  70% 4. Develop healthy thinking patterns and beliefs about  self, others, and the world that lead to the alleviation and help prevent the relapse of  Depression  70% STG:1.Describe current and past experiences with  depression including their impact on functioning and  attempts to resolve it.  70 % 2. Identify and replace thoughts and beliefs that support depression.  70 % 3.Learn and implement behavioral strategies to overcome depression. 70% 4.Verbalize an understanding of healthy and unhealthy emotions with the intent of increasing the use of  healthy emotions to guide actions.  50 %  Target Date:  10/08/2023 Frequency: Bi Weekly Modality:Individual Interventions by Therapist:  CBT, problem solving, EMDR, insight oriented  Patient approved Treatment Plan  Macall Mccroskey Harrel Lemon, LCSW

## 2023-05-15 ENCOUNTER — Other Ambulatory Visit (HOSPITAL_COMMUNITY): Payer: Self-pay

## 2023-05-24 ENCOUNTER — Ambulatory Visit (INDEPENDENT_AMBULATORY_CARE_PROVIDER_SITE_OTHER): Payer: BC Managed Care – PPO | Admitting: Psychology

## 2023-05-24 DIAGNOSIS — F331 Major depressive disorder, recurrent, moderate: Secondary | ICD-10-CM | POA: Diagnosis not present

## 2023-05-24 DIAGNOSIS — F428 Other obsessive-compulsive disorder: Secondary | ICD-10-CM

## 2023-05-24 NOTE — Progress Notes (Signed)
                Zeriah Baysinger G Sania Noy, LCSW 

## 2023-05-25 ENCOUNTER — Other Ambulatory Visit (HOSPITAL_COMMUNITY): Payer: Self-pay

## 2023-05-25 ENCOUNTER — Other Ambulatory Visit: Payer: Self-pay

## 2023-05-25 ENCOUNTER — Other Ambulatory Visit: Payer: Self-pay | Admitting: Internal Medicine

## 2023-05-25 DIAGNOSIS — E039 Hypothyroidism, unspecified: Secondary | ICD-10-CM

## 2023-06-05 ENCOUNTER — Telehealth (INDEPENDENT_AMBULATORY_CARE_PROVIDER_SITE_OTHER): Payer: BC Managed Care – PPO | Admitting: Family Medicine

## 2023-06-05 ENCOUNTER — Encounter (INDEPENDENT_AMBULATORY_CARE_PROVIDER_SITE_OTHER): Payer: Self-pay | Admitting: Family Medicine

## 2023-06-05 VITALS — BP 145/73 | HR 73 | Temp 98.3°F | Ht 63.0 in | Wt >= 6400 oz

## 2023-06-05 DIAGNOSIS — E669 Obesity, unspecified: Secondary | ICD-10-CM | POA: Diagnosis not present

## 2023-06-05 DIAGNOSIS — Z6841 Body Mass Index (BMI) 40.0 and over, adult: Secondary | ICD-10-CM | POA: Diagnosis not present

## 2023-06-05 DIAGNOSIS — E1165 Type 2 diabetes mellitus with hyperglycemia: Secondary | ICD-10-CM

## 2023-06-05 DIAGNOSIS — Z7985 Long-term (current) use of injectable non-insulin antidiabetic drugs: Secondary | ICD-10-CM

## 2023-06-05 DIAGNOSIS — E559 Vitamin D deficiency, unspecified: Secondary | ICD-10-CM | POA: Diagnosis not present

## 2023-06-05 MED ORDER — VITAMIN D (ERGOCALCIFEROL) 1.25 MG (50000 UNIT) PO CAPS
50000.0000 [IU] | ORAL_CAPSULE | ORAL | 0 refills | Status: DC
Start: 2023-06-05 — End: 2023-07-03
  Filled 2023-06-05: qty 4, 28d supply, fill #0

## 2023-06-05 MED ORDER — SEMAGLUTIDE (2 MG/DOSE) 8 MG/3ML ~~LOC~~ SOPN
2.0000 mg | PEN_INJECTOR | SUBCUTANEOUS | 0 refills | Status: DC
Start: 2023-06-05 — End: 2023-07-03
  Filled 2023-06-05: qty 3, 28d supply, fill #0

## 2023-06-06 ENCOUNTER — Other Ambulatory Visit (HOSPITAL_COMMUNITY): Payer: Self-pay

## 2023-06-06 ENCOUNTER — Other Ambulatory Visit: Payer: Self-pay | Admitting: Pulmonary Disease

## 2023-06-06 ENCOUNTER — Other Ambulatory Visit: Payer: Self-pay

## 2023-06-07 ENCOUNTER — Ambulatory Visit (INDEPENDENT_AMBULATORY_CARE_PROVIDER_SITE_OTHER): Payer: BC Managed Care – PPO | Admitting: Psychology

## 2023-06-07 DIAGNOSIS — F428 Other obsessive-compulsive disorder: Secondary | ICD-10-CM | POA: Diagnosis not present

## 2023-06-07 DIAGNOSIS — F331 Major depressive disorder, recurrent, moderate: Secondary | ICD-10-CM

## 2023-06-07 NOTE — Progress Notes (Signed)
                Annaliyah Willig G Jehieli Brassell, LCSW

## 2023-06-07 NOTE — Progress Notes (Signed)
TeleHealth Visit:  Due to the COVID-19 pandemic, this visit was completed with telemedicine (audio/video) technology to reduce patient and provider exposure as well as to preserve personal protective equipment.   Alexandra Beck has verbally consented to this TeleHealth visit. The provider is located at home, the patient is located at the [facility] office. The participants in this visit include the listed provider and patient. The visit was conducted today via MyChart video.   Chief Complaint: OBESITY Alexandra Beck is here to discuss her progress with her obesity treatment plan along with follow-up of her obesity related diagnoses. Alexandra Beck is on practicing portion control and making smarter food choices, such as increasing vegetables and decreasing simple carbohydrates and states she is following her eating plan approximately 60% of the time. Alexandra Beck states she is doing 0 minutes 0 times per week.  Today's visit was #: 73 Starting weight: 468 lbs Starting date: 04/17/2018 Today's weight: 417 lbs Today's date: 06/05/2023 Total lbs lost to date: 51 Total lbs lost since last in-office visit: 24  Interim History: Follow up appointment after 3 months- she used this time to gain perspective on what her patterns of eating and normal obstacles are in her life to staying consistent on plan.   Subjective:   1. Type 2 diabetes mellitus with hyperglycemia, without long-term current use of insulin (HCC) Patient's last A1c was 6.0.  No GI side effects of Ozempic were mentioned.  2. Vitamin D deficiency Patient denies nausea, vomiting, or muscle weakness but notes fatigue.  Assessment/Plan:   1. Type 2 diabetes mellitus with hyperglycemia, without long-term current use of insulin (HCC) We will refill Ozempic 2 mg subcu weekly for 90 days.  - Semaglutide, 2 MG/DOSE, 8 MG/3ML SOPN; Inject 2 mg into the skin as directed once a week.  Dispense: 9 mL; Refill: 0  2. Vitamin D deficiency Patient will  continue prescription vitamin D once weekly, and we will refill for 1 month.  - Vitamin D, Ergocalciferol, (DRISDOL) 1.25 MG (50000 UNIT) CAPS capsule; Take 1 capsule (50,000 Units total) by mouth every 7 (seven) days.  Dispense: 4 capsule; Refill: 0  3. BMI 70 and over, adult (HCC)  4. Obesity with starting BMI of 85.6 Alexandra Beck is currently in the action stage of change. As such, her goal is to continue with weight loss efforts. She has agreed to practicing portion control and making smarter food choices, such as increasing vegetables and decreasing simple carbohydrates.   Exercise goals: All adults should avoid inactivity. Some physical activity is better than none, and adults who participate in any amount of physical activity gain some health benefits.  Behavioral modification strategies: increasing lean protein intake, meal planning and cooking strategies, keeping healthy foods in the home, and planning for success.  Alexandra Beck has agreed to follow-up with our clinic in 4 weeks. She was informed of the importance of frequent follow-up visits to maximize her success with intensive lifestyle modifications for her multiple health conditions.  Objective:   VITALS: Per patient if applicable, see vitals. GENERAL: Alert and in no acute distress. CARDIOPULMONARY: No increased WOB. Speaking in clear sentences.  PSYCH: Pleasant and cooperative. Speech normal rate and rhythm. Affect is appropriate. Insight and judgement are appropriate. Attention is focused, linear, and appropriate.  NEURO: Oriented as arrived to appointment on time with no prompting.   Lab Results  Component Value Date   CREATININE 1.03 (H) 03/13/2023   BUN 16 03/13/2023   NA 141 03/13/2023   K  4.6 03/13/2023   CL 103 03/13/2023   CO2 21 03/13/2023   Lab Results  Component Value Date   ALT 12 03/13/2023   AST 9 03/13/2023   ALKPHOS 88 03/13/2023   BILITOT 0.2 03/13/2023   Lab Results  Component Value Date   HGBA1C 6.0 (H)  03/13/2023   HGBA1C 5.8 (H) 05/13/2022   HGBA1C 5.9 (H) 11/11/2020   HGBA1C 5.6 12/17/2019   HGBA1C 5.6 07/04/2019   Lab Results  Component Value Date   INSULIN 28.9 (H) 03/13/2023   INSULIN 23.6 11/11/2020   INSULIN 17.1 12/17/2019   INSULIN 23.5 07/04/2019   INSULIN 25.8 (H) 12/04/2018   Lab Results  Component Value Date   TSH 2.040 03/13/2023   Lab Results  Component Value Date   CHOL 176 03/13/2023   HDL 58 03/13/2023   LDLCALC 91 03/13/2023   LDLDIRECT 141.0 03/02/2016   TRIG 158 (H) 03/13/2023   CHOLHDL 3 06/17/2022   Lab Results  Component Value Date   VD25OH 22.3 (L) 03/13/2023   VD25OH 30.2 11/11/2020   VD25OH 38.2 12/17/2019   Lab Results  Component Value Date   WBC 7.2 03/13/2023   HGB 10.9 (L) 03/13/2023   HCT 35.1 03/13/2023   MCV 90 03/13/2023   PLT 209 03/13/2023   Lab Results  Component Value Date   IRON 58 03/13/2023   TIBC 385 03/13/2023   FERRITIN 45 03/13/2023    Attestation Statements:   Reviewed by clinician on day of visit: allergies, medications, problem list, medical history, surgical history, family history, social history, and previous encounter notes.   I, Burt Knack, am acting as transcriptionist for Reuben Likes, MD.  I have reviewed the above documentation for accuracy and completeness, and I agree with the above. - Reuben Likes, MD

## 2023-06-09 ENCOUNTER — Other Ambulatory Visit: Payer: Self-pay

## 2023-06-09 MED ORDER — MODAFINIL 100 MG PO TABS
100.0000 mg | ORAL_TABLET | Freq: Every day | ORAL | 3 refills | Status: DC
  Filled 2023-06-09: qty 60, 60d supply, fill #0
  Filled 2023-06-28 – 2023-08-14 (×2): qty 60, 60d supply, fill #1
  Filled 2023-10-02 – 2023-10-23 (×3): qty 60, 60d supply, fill #2

## 2023-06-09 NOTE — Telephone Encounter (Signed)
Refill for modafinil was sent. Please schedule office visit, next available with me/APP

## 2023-06-21 ENCOUNTER — Ambulatory Visit (INDEPENDENT_AMBULATORY_CARE_PROVIDER_SITE_OTHER): Payer: BC Managed Care – PPO | Admitting: Psychology

## 2023-06-21 ENCOUNTER — Other Ambulatory Visit (HOSPITAL_COMMUNITY): Payer: Self-pay

## 2023-06-21 ENCOUNTER — Other Ambulatory Visit: Payer: Self-pay | Admitting: Internal Medicine

## 2023-06-21 ENCOUNTER — Other Ambulatory Visit: Payer: Self-pay | Admitting: Pulmonary Disease

## 2023-06-21 ENCOUNTER — Other Ambulatory Visit: Payer: Self-pay

## 2023-06-21 DIAGNOSIS — E039 Hypothyroidism, unspecified: Secondary | ICD-10-CM

## 2023-06-21 DIAGNOSIS — F428 Other obsessive-compulsive disorder: Secondary | ICD-10-CM

## 2023-06-21 DIAGNOSIS — F331 Major depressive disorder, recurrent, moderate: Secondary | ICD-10-CM

## 2023-06-21 MED ORDER — LEVOTHYROXINE SODIUM 150 MCG PO TABS
150.0000 ug | ORAL_TABLET | Freq: Every day | ORAL | 0 refills | Status: DC
Start: 2023-06-21 — End: 2023-06-30
  Filled 2023-06-21: qty 30, 30d supply, fill #0

## 2023-06-21 MED ORDER — ROSUVASTATIN CALCIUM 10 MG PO TABS
10.0000 mg | ORAL_TABLET | Freq: Every day | ORAL | 0 refills | Status: DC
  Filled 2023-06-21: qty 30, 30d supply, fill #0

## 2023-06-21 MED ORDER — METOPROLOL SUCCINATE ER 50 MG PO TB24
50.0000 mg | ORAL_TABLET | Freq: Every day | ORAL | 0 refills | Status: DC
  Filled 2023-06-21: qty 30, 30d supply, fill #0

## 2023-06-21 MED ORDER — METFORMIN HCL 500 MG PO TABS
500.0000 mg | ORAL_TABLET | Freq: Every day | ORAL | 0 refills | Status: DC
  Filled 2023-06-21: qty 30, 30d supply, fill #0

## 2023-06-21 NOTE — Progress Notes (Signed)
                Alexandra Beck G Navarre Diana, LCSW

## 2023-06-23 ENCOUNTER — Encounter: Payer: BC Managed Care – PPO | Admitting: Internal Medicine

## 2023-06-28 ENCOUNTER — Other Ambulatory Visit: Payer: Self-pay | Admitting: Pulmonary Disease

## 2023-06-28 ENCOUNTER — Other Ambulatory Visit: Payer: Self-pay

## 2023-06-28 NOTE — Telephone Encounter (Signed)
Per chart patient is on 100mg  + 200mg  = 300mg  daily. Thank you!

## 2023-06-30 ENCOUNTER — Other Ambulatory Visit (HOSPITAL_COMMUNITY): Payer: Self-pay

## 2023-06-30 ENCOUNTER — Ambulatory Visit (INDEPENDENT_AMBULATORY_CARE_PROVIDER_SITE_OTHER): Payer: BC Managed Care – PPO | Admitting: Internal Medicine

## 2023-06-30 VITALS — BP 140/86 | HR 71 | Temp 98.4°F | Ht 63.0 in | Wt >= 6400 oz

## 2023-06-30 DIAGNOSIS — Z7985 Long-term (current) use of injectable non-insulin antidiabetic drugs: Secondary | ICD-10-CM

## 2023-06-30 DIAGNOSIS — I5032 Chronic diastolic (congestive) heart failure: Secondary | ICD-10-CM | POA: Diagnosis not present

## 2023-06-30 DIAGNOSIS — E782 Mixed hyperlipidemia: Secondary | ICD-10-CM

## 2023-06-30 DIAGNOSIS — E039 Hypothyroidism, unspecified: Secondary | ICD-10-CM

## 2023-06-30 DIAGNOSIS — Z7984 Long term (current) use of oral hypoglycemic drugs: Secondary | ICD-10-CM

## 2023-06-30 DIAGNOSIS — I10 Essential (primary) hypertension: Secondary | ICD-10-CM | POA: Diagnosis not present

## 2023-06-30 DIAGNOSIS — Z0001 Encounter for general adult medical examination with abnormal findings: Secondary | ICD-10-CM

## 2023-06-30 DIAGNOSIS — E669 Obesity, unspecified: Secondary | ICD-10-CM

## 2023-06-30 DIAGNOSIS — E1169 Type 2 diabetes mellitus with other specified complication: Secondary | ICD-10-CM

## 2023-06-30 MED ORDER — ROSUVASTATIN CALCIUM 10 MG PO TABS
10.0000 mg | ORAL_TABLET | Freq: Every day | ORAL | 3 refills | Status: DC
  Filled 2023-06-30: qty 90, 90d supply, fill #0

## 2023-06-30 MED ORDER — METFORMIN HCL 500 MG PO TABS
500.0000 mg | ORAL_TABLET | Freq: Every day | ORAL | 3 refills | Status: DC
  Filled 2023-06-30 – 2023-07-18 (×2): qty 90, 90d supply, fill #0
  Filled 2023-10-23: qty 90, 90d supply, fill #1
  Filled 2024-01-18: qty 90, 90d supply, fill #2
  Filled 2024-04-17: qty 90, 90d supply, fill #3

## 2023-06-30 MED ORDER — SPIRONOLACTONE 25 MG PO TABS
25.0000 mg | ORAL_TABLET | Freq: Every day | ORAL | 3 refills | Status: DC
Start: 2023-06-30 — End: 2024-08-02
  Filled 2023-06-30: qty 90, 90d supply, fill #0
  Filled 2023-10-01: qty 90, 90d supply, fill #1
  Filled 2024-01-05: qty 90, 90d supply, fill #2
  Filled 2024-04-17: qty 90, 90d supply, fill #3

## 2023-06-30 MED ORDER — ROSUVASTATIN CALCIUM 20 MG PO TABS
20.0000 mg | ORAL_TABLET | Freq: Every day | ORAL | 3 refills | Status: DC
  Filled 2023-06-30: qty 90, 90d supply, fill #0
  Filled 2023-10-01: qty 90, 90d supply, fill #1
  Filled 2024-01-05: qty 90, 90d supply, fill #2
  Filled 2024-04-17: qty 90, 90d supply, fill #3

## 2023-06-30 MED ORDER — METOPROLOL SUCCINATE ER 50 MG PO TB24
50.0000 mg | ORAL_TABLET | Freq: Every day | ORAL | 3 refills | Status: DC
  Filled 2023-06-30 – 2023-07-18 (×2): qty 90, 90d supply, fill #0

## 2023-06-30 MED ORDER — LEVOTHYROXINE SODIUM 150 MCG PO TABS
150.0000 ug | ORAL_TABLET | Freq: Every day | ORAL | 3 refills | Status: DC
Start: 2023-06-30 — End: 2024-07-24
  Filled 2023-06-30 – 2023-07-18 (×2): qty 90, 90d supply, fill #0
  Filled 2023-10-23: qty 90, 90d supply, fill #1
  Filled 2024-01-18: qty 90, 90d supply, fill #2
  Filled 2024-04-17: qty 90, 90d supply, fill #3

## 2023-06-30 NOTE — Patient Instructions (Signed)
Ok to increase the crestor to 20 mg per day  Please continue all other medications as before, and refills have been done if requested.  Please have the pharmacy call with any other refills you may need.  Please continue your efforts at being more active, low cholesterol diet, and weight control.  You are otherwise up to date with prevention measures today.  Please keep your appointments with your specialists as you may have planned - [facility] for the A1c and wt control  Please make an Appointment to return for your 1 year visit, or sooner if needed

## 2023-06-30 NOTE — Progress Notes (Unsigned)
Patient ID: BICH MCHANEY, female   DOB: [birth year], 42 y.o.   MRN: [id number]         Chief Complaint:: wellness exam and hld, htn, dm, low thryoid       HPI:  Alexandra Beck is a 42 y.o. female here for wellness exam; pt to call gyn for yearly pap and mammogram, o/w up to date                        Also Pt denies chest pain, increased sob or doe, wheezing, orthopnea, PND, increased LE swelling, palpitations, dizziness or syncope.   Pt denies polydipsia, polyuria, or new focal neuro s/s.    Pt denies fever, night sweats, loss of appetite, or other constitutional symptoms  Wt overall down from 441 recently to 414 with better diet and weekly   Wt Readings from Last 3 Encounters:  06/30/23 (!) 414 lb (187.8 kg)  06/05/23 (!) 417 lb (189.1 kg)  03/13/23 (!) 441 lb (200 kg)   BP Readings from Last 3 Encounters:  06/30/23 (!) 140/86  06/05/23 (!) 145/73  03/13/23 (!) 151/89   Immunization History  Administered Date(s) Administered   Influenza,inj,Quad PF,6+ Mos 08/02/2013, 08/28/2016, 10/11/2017, 07/23/2019, 10/27/2021, 08/09/2022   PFIZER(Purple Top)SARS-COV-2 Vaccination 02/06/2020, 02/27/2020   Pneumococcal Polysaccharide-23 12/28/2012   Tdap 08/26/2015   Health Maintenance Due  Topic Date Due   PAP SMEAR-Modifier  Never done   Diabetic kidney evaluation - Urine ACR  04/18/2019   INFLUENZA VACCINE  06/29/2023      Past Medical History:  Diagnosis Date   Acute renal failure (ARF) (HCC) 11/2012    multifactorial-likely secondary to ATN in the setting of sepsis and hypotension, and also from rhabdomyolysis   Anemia    Cellulitis    Chronic diastolic CHF (congestive heart failure) (HCC)    Depression    Diabetes mellitus (HCC)    GERD (gastroesophageal reflux disease)    Hyperlipidemia    Hypertension    Hypothyroidism    Morbid obesity with BMI of 70 and over, adult (HCC)    Obesity hypoventilation syndrome (HCC)    OSA (obstructive sleep apnea)    PVC's (premature  ventricular contractions)    Recurrent cellulitis of lower leg    LLE, venous insuff   Sepsis (HCC) 11/2012   Secondary to cellulitis   Sleep apnea    Venous insufficiency of leg    Past Surgical History:  Procedure Laterality Date   IR FLUORO GUIDE CV MIDLINE PICC RIGHT  03/28/2017   IR US GUIDE VASC ACCESS RIGHT  03/28/2017   WISDOM TOOTH EXTRACTION      reports that she quit smoking about 13 years ago. Her smoking use included cigarettes. She started smoking about 21 years ago. She has a 4 pack-year smoking history. She has never used smokeless tobacco. She reports that she does not drink alcohol and does not use drugs. family history includes Diabetes in her mother; Heart disease in her father; High Cholesterol in her father and mother; Hypertension in her father and mother; Obesity in her father and mother; Sleep apnea in her father and mother; Thyroid disease in her mother. Allergies  Allergen Reactions   Aspirin Hives and Swelling    REACTION: throat swelling, hives Other reaction(s): Unknown Other reaction(s): Unknown   Doxycycline Other (See Comments)    Abdominal pain, difficulty swallowing   Lisinopril Cough   Niacin And Related     Other reaction(s):  Unknown Other reaction(s): Unknown   Niaspan [Niacin Er]     Caused flushing   Current Outpatient Medications on File Prior to Visit  Medication Sig Dispense Refill   ibuprofen (ADVIL) 200 MG tablet Take 800 mg by mouth every 6 (six) hours as needed for headache or moderate pain.     iron polysaccharides (FERREX 150) 150 MG capsule Take 1 capsule (150 mg total) by mouth daily. 90 capsule 1   lamoTRIgine (LAMICTAL) 150 MG tablet Take 1 tablet (150 mg total) by mouth daily. 30 tablet 2   losartan (COZAAR) 100 MG tablet Take 1 tablet (100 mg total) by mouth daily. 90 tablet 3   modafinil (PROVIGIL) 100 MG tablet Take 1 tablet (100mg  total) by mouth daily at 9am (along with 200 mg tablet daily at 9am for a total of 300 mg daily)  60 tablet 3   Semaglutide, 2 MG/DOSE, 8 MG/3ML SOPN Inject 2 mg into the skin as directed once a week. 9 mL 0   sertraline (ZOLOFT) 100 MG tablet Take 2 tablets (200 mg total) by mouth daily. 180 tablet 3   torsemide (DEMADEX) 20 MG tablet Take 4 tablets (80 mg total) by mouth 2 (two) times daily as needed. 720 tablet 3   traMADol (ULTRAM) 50 MG tablet Take 1 tablet (50 mg total) by mouth every 8 (eight) hours as needed. 40 tablet 1   triamcinolone cream (KENALOG) 0.1 % Apply 1 Application topically 2 (two) times daily. 30 g 1   Vitamin D, Ergocalciferol, (DRISDOL) 1.25 MG (50000 UNIT) CAPS capsule Take 1 capsule (50,000 Units total) by mouth every 7 (seven) days. 4 capsule 0   modafinil (PROVIGIL) 200 MG tablet Take 1 tablet by mouth daily at 9am (along with 100 mg tablet daily at 9am (total 300mg )) 60 tablet 3   No current facility-administered medications on file prior to visit.        ROS:  All others reviewed and negative.  Objective        PE:  BP (!) 140/86   Pulse 71   Temp 98.4 F (36.9 C) (Oral)   Ht 5\' 3"  (1.6 m)   Wt (!) 414 lb (187.8 kg)   SpO2 96%   BMI 73.34 kg/m                 Constitutional: Pt appears in NAD               HENT: Head: NCAT.                Right Ear: External ear normal.                 Left Ear: External ear normal.                Eyes: . Pupils are equal, round, and reactive to light. Conjunctivae and EOM are normal               Nose: without d/c or deformity               Neck: Neck supple. Gross normal ROM               Cardiovascular: Normal rate and regular rhythm.                 Pulmonary/Chest: Effort normal and breath sounds without rales or wheezing.                Abd:  Soft, NT,  ND, + BS, no organomegaly               Neurological: Pt is alert. At baseline orientation, motor grossly intact               Skin: Skin is warm. No rashes, no other new lesions, LE edema - none               Psychiatric: Pt behavior is normal without  agitation   Micro: none  Cardiac tracings I have personally interpreted today:  none  Pertinent Radiological findings (summarize): none   Lab Results  Component Value Date   WBC 7.2 03/13/2023   HGB 10.9 (L) 03/13/2023   HCT 35.1 03/13/2023   PLT 209 03/13/2023   GLUCOSE 106 (H) 03/13/2023   CHOL 176 03/13/2023   TRIG 158 (H) 03/13/2023   HDL 58 03/13/2023   LDLDIRECT 141.0 03/02/2016   LDLCALC 91 03/13/2023   ALT 12 03/13/2023   AST 9 03/13/2023   NA 141 03/13/2023   K 4.6 03/13/2023   CL 103 03/13/2023   CREATININE 1.03 (H) 03/13/2023   BUN 16 03/13/2023   CO2 21 03/13/2023   TSH 2.040 03/13/2023   INR 1.03 03/25/2017   HGBA1C 6.0 (H) 03/13/2023   MICROALBUR 8.05 (H) 06/26/2007   Assessment/Plan:  Alexandra Beck is a 42 y.o. White or Caucasian [1] female with  has a past medical history of Acute renal failure (ARF) (HCC) (11/2012), Anemia, Cellulitis, Chronic diastolic CHF (congestive heart failure) (HCC), Depression, Diabetes mellitus (HCC), GERD (gastroesophageal reflux disease), Hyperlipidemia, Hypertension, Hypothyroidism, Morbid obesity with BMI of 70 and over, adult (HCC), Obesity hypoventilation syndrome (HCC), OSA (obstructive sleep apnea), PVC's (premature ventricular contractions), Recurrent cellulitis of lower leg, Sepsis (HCC) (11/2012), Sleep apnea, and Venous insufficiency of leg.  Encounter for well adult exam with abnormal findings Age and sex appropriate education and counseling updated with regular exercise and diet Referrals for preventative services - pt to call for yearly mammogram and pap Immunizations addressed - none needed Smoking counseling  - none needed Evidence for depression or other mood disorder - none significant Most recent labs reviewed. I have personally reviewed and have noted: 1) the patient's medical and social history 2) The patient's current medications and supplements 3) The patient's height, weight, and BMI have been recorded in  the chart   Essential hypertension BP Readings from Last 3 Encounters:  06/30/23 (!) 140/86  06/05/23 (!) 145/73  03/13/23 (!) 151/89   uncontrolled, pt to continue medical treatment losartan 100 mg every day, declines other change today   Hyperlipidemia Lab Results  Component Value Date   LDLCALC 91 03/13/2023   Stable, pt to increase crestor 20 mg qd   Type 2 diabetes mellitus with obesity (HCC) Lab Results  Component Value Date   HGBA1C 6.0 (H) 03/13/2023   Stable, pt to continue current medical treatment metformin 500 every day, and ozempic 2 mg weekly   Hypothyroidism Lab Results  Component Value Date   TSH 2.040 03/13/2023   Stable, pt to continue levothyroxine 150 mcg qd   Chronic diastolic CHF (congestive heart failure) (HCC) Stable volume,  to f/u any worsening symptoms or concerns on current med tx  Followup: Return in about 1 year (around 06/29/2024).  Oliver Barre, MD 07/02/2023 3:18 PM [facility]

## 2023-07-02 ENCOUNTER — Encounter: Payer: Self-pay | Admitting: Internal Medicine

## 2023-07-02 ENCOUNTER — Encounter (INDEPENDENT_AMBULATORY_CARE_PROVIDER_SITE_OTHER): Payer: Self-pay | Admitting: Family Medicine

## 2023-07-02 NOTE — Assessment & Plan Note (Signed)
Lab Results  Component Value Date   HGBA1C 6.0 (H) 03/13/2023   Stable, pt to continue current medical treatment metformin 500 every day, and ozempic 2 mg weekly

## 2023-07-02 NOTE — Assessment & Plan Note (Signed)
BP Readings from Last 3 Encounters:  06/30/23 (!) 140/86  06/05/23 (!) 145/73  03/13/23 (!) 151/89   uncontrolled, pt to continue medical treatment losartan 100 mg every day, declines other change today

## 2023-07-02 NOTE — Assessment & Plan Note (Signed)
Lab Results  Component Value Date   TSH 2.040 03/13/2023   Stable, pt to continue levothyroxine 150 mcg qd

## 2023-07-02 NOTE — Assessment & Plan Note (Signed)
Age and sex appropriate education and counseling updated with regular exercise and diet Referrals for preventative services - pt to call for yearly mammogram and pap Immunizations addressed - none needed Smoking counseling  - none needed Evidence for depression or other mood disorder - none significant Most recent labs reviewed. I have personally reviewed and have noted: 1) the patient's medical and social history 2) The patient's current medications and supplements 3) The patient's height, weight, and BMI have been recorded in the chart

## 2023-07-02 NOTE — Assessment & Plan Note (Signed)
Lab Results  Component Value Date   LDLCALC 91 03/13/2023   Stable, pt to increase crestor 20 mg qd

## 2023-07-02 NOTE — Assessment & Plan Note (Signed)
Stable volume,  to f/u any worsening symptoms or concerns on current med tx

## 2023-07-03 ENCOUNTER — Other Ambulatory Visit: Payer: Self-pay

## 2023-07-03 ENCOUNTER — Telehealth (INDEPENDENT_AMBULATORY_CARE_PROVIDER_SITE_OTHER): Payer: BC Managed Care – PPO | Admitting: Family Medicine

## 2023-07-03 ENCOUNTER — Encounter (INDEPENDENT_AMBULATORY_CARE_PROVIDER_SITE_OTHER): Payer: Self-pay | Admitting: Family Medicine

## 2023-07-03 ENCOUNTER — Other Ambulatory Visit (HOSPITAL_COMMUNITY): Payer: Self-pay

## 2023-07-03 DIAGNOSIS — E559 Vitamin D deficiency, unspecified: Secondary | ICD-10-CM

## 2023-07-03 DIAGNOSIS — E669 Obesity, unspecified: Secondary | ICD-10-CM | POA: Diagnosis not present

## 2023-07-03 DIAGNOSIS — Z6841 Body Mass Index (BMI) 40.0 and over, adult: Secondary | ICD-10-CM | POA: Diagnosis not present

## 2023-07-03 DIAGNOSIS — E1165 Type 2 diabetes mellitus with hyperglycemia: Secondary | ICD-10-CM

## 2023-07-03 DIAGNOSIS — Z7985 Long-term (current) use of injectable non-insulin antidiabetic drugs: Secondary | ICD-10-CM

## 2023-07-03 MED ORDER — MODAFINIL 200 MG PO TABS
ORAL_TABLET | ORAL | 3 refills | Status: DC
  Filled 2023-07-03: qty 60, 60d supply, fill #0
  Filled 2023-09-04: qty 60, 60d supply, fill #1
  Filled 2023-10-23 – 2023-11-27 (×4): qty 60, 60d supply, fill #2

## 2023-07-03 MED ORDER — SEMAGLUTIDE (2 MG/DOSE) 8 MG/3ML ~~LOC~~ SOPN
2.0000 mg | PEN_INJECTOR | SUBCUTANEOUS | 0 refills | Status: DC
Start: 2023-07-03 — End: 2023-09-18
  Filled 2023-07-03: qty 9, 84d supply, fill #0
  Filled 2023-07-18: qty 6, 56d supply, fill #0

## 2023-07-03 MED ORDER — VITAMIN D (ERGOCALCIFEROL) 1.25 MG (50000 UNIT) PO CAPS
50000.0000 [IU] | ORAL_CAPSULE | ORAL | 0 refills | Status: DC
  Filled 2023-07-03: qty 4, 28d supply, fill #0

## 2023-07-03 NOTE — Telephone Encounter (Signed)
Refill has been sent.  °

## 2023-07-03 NOTE — Progress Notes (Signed)
TeleHealth Visit:  Due to the COVID-19 pandemic, this visit was completed with telemedicine (audio/video) technology to reduce patient and provider exposure as well as to preserve personal protective equipment.   Natalynn has verbally consented to this TeleHealth visit. The patient is located at home, the provider is located at the [facility] office. The participants in this visit include the listed provider and patient. The visit was conducted today via MyChart video.   Chief Complaint: OBESITY Rayelyn is here to discuss her progress with her obesity treatment plan along with follow-up of her obesity related diagnoses. Zoryana is on practicing portion control and making smarter food choices, such as increasing vegetables and decreasing simple carbohydrates and states she is following her eating plan approximately 80% of the time. Madalee states she is doing 0 minutes 0 times per week.  Today's visit was #: 74 Starting weight: 468 lbs Starting date: 04/17/2018  Interim History: Patient at home with possible stomach bug.  She is feeling alright right now.  She is working from home today.  She realizes she isn't necessarily getting 3 meals a day but she is getting most of the food in just broken up in a different way.  This month isn't terribly busy for her.  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  Subjective:   1. Vitamin D deficiency Patient is on prescription Vitamin D. She denies nausea, vomiting, or muscle weakness. Last lab was from April 2024.  2. Type 2 diabetes mellitus with hyperglycemia, without long-term current use of insulin (HCC) Patient is on Ozempic 2 mg SubQ with no GI side effects. Last A1c was 6.0 and insulin 28.9.  Assessment/Plan:   1. Vitamin D deficiency We will refill prescription Vitamin D once weekly for 1 month.   - Vitamin D, Ergocalciferol, (DRISDOL) 1.25 MG (50000 UNIT) CAPS capsule; Take 1 capsule (50,000 Units  total) by mouth every 7 (seven) days.  Dispense: 4 capsule; Refill: 0  2. Type 2 diabetes mellitus with hyperglycemia, without long-term current use of insulin (HCC) We will refill Ozempic 2 mg once weekly for 90 days.   - Semaglutide, 2 MG/DOSE, 8 MG/3ML SOPN; Inject 2 mg into the skin as directed once a week.  Dispense: 9 mL; Refill: 0  3. BMI 70 and over, adult (HCC)  4. Obesity with starting BMI of 85.6 Gwendolyne is currently in the action stage of change. As such, her goal is to continue with weight loss efforts. She has agreed to practicing portion control and making smarter food choices, such as increasing vegetables and decreasing simple carbohydrates.   Exercise goals: All adults should avoid inactivity. Some physical activity is better than none, and adults who participate in any amount of physical activity gain some health benefits.  Behavioral modification strategies: increasing lean protein intake, meal planning and cooking strategies, keeping healthy foods in the home, and planning for success.  Phinley has agreed to follow-up with our clinic in 6 weeks. She was informed of the importance of frequent follow-up visits to maximize her success with intensive lifestyle modifications for her multiple health conditions.  Objective:   VITALS: Per patient if applicable, see vitals. GENERAL: Alert and in no acute distress. CARDIOPULMONARY: No increased WOB. Speaking in clear sentences.  PSYCH: Pleasant and cooperative. Speech normal rate and rhythm. Affect is appropriate. Insight and judgement are appropriate. Attention is focused, linear, and appropriate.  NEURO: Oriented as arrived to appointment on time  with no prompting.   Lab Results  Component Value Date   CREATININE 1.03 (H) 03/13/2023   BUN 16 03/13/2023   NA 141 03/13/2023   K 4.6 03/13/2023   CL 103 03/13/2023   CO2 21 03/13/2023   Lab Results  Component Value Date   ALT 12 03/13/2023   AST 9 03/13/2023   ALKPHOS 88  03/13/2023   BILITOT 0.2 03/13/2023   Lab Results  Component Value Date   HGBA1C 6.0 (H) 03/13/2023   HGBA1C 5.8 (H) 05/13/2022   HGBA1C 5.9 (H) 11/11/2020   HGBA1C 5.6 12/17/2019   HGBA1C 5.6 07/04/2019   Lab Results  Component Value Date   INSULIN 28.9 (H) 03/13/2023   INSULIN 23.6 11/11/2020   INSULIN 17.1 12/17/2019   INSULIN 23.5 07/04/2019   INSULIN 25.8 (H) 12/04/2018   Lab Results  Component Value Date   TSH 2.040 03/13/2023   Lab Results  Component Value Date   CHOL 176 03/13/2023   HDL 58 03/13/2023   LDLCALC 91 03/13/2023   LDLDIRECT 141.0 03/02/2016   TRIG 158 (H) 03/13/2023   CHOLHDL 3 06/17/2022   Lab Results  Component Value Date   VD25OH 22.3 (L) 03/13/2023   VD25OH 30.2 11/11/2020   VD25OH 38.2 12/17/2019   Lab Results  Component Value Date   WBC 7.2 03/13/2023   HGB 10.9 (L) 03/13/2023   HCT 35.1 03/13/2023   MCV 90 03/13/2023   PLT 209 03/13/2023   Lab Results  Component Value Date   IRON 58 03/13/2023   TIBC 385 03/13/2023   FERRITIN 45 03/13/2023    Attestation Statements:   Reviewed by clinician on day of visit: allergies, medications, problem list, medical history, surgical history, family history, social history, and previous encounter notes.   I, Burt Knack, am acting as transcriptionist for Reuben Likes, MD.  I have reviewed the above documentation for accuracy and completeness, and I agree with the above. - Reuben Likes, MD

## 2023-07-04 ENCOUNTER — Other Ambulatory Visit: Payer: Self-pay

## 2023-07-05 ENCOUNTER — Ambulatory Visit (INDEPENDENT_AMBULATORY_CARE_PROVIDER_SITE_OTHER): Payer: BC Managed Care – PPO | Admitting: Psychology

## 2023-07-05 DIAGNOSIS — F331 Major depressive disorder, recurrent, moderate: Secondary | ICD-10-CM

## 2023-07-05 DIAGNOSIS — F428 Other obsessive-compulsive disorder: Secondary | ICD-10-CM | POA: Diagnosis not present

## 2023-07-05 NOTE — Progress Notes (Unsigned)
                Aasha Dina G Tichina Koebel, LCSW

## 2023-07-19 ENCOUNTER — Ambulatory Visit (INDEPENDENT_AMBULATORY_CARE_PROVIDER_SITE_OTHER): Payer: BC Managed Care – PPO | Admitting: Psychology

## 2023-07-19 ENCOUNTER — Other Ambulatory Visit (HOSPITAL_COMMUNITY): Payer: Self-pay

## 2023-07-19 ENCOUNTER — Encounter: Payer: Self-pay | Admitting: Pharmacist

## 2023-07-19 ENCOUNTER — Other Ambulatory Visit: Payer: Self-pay

## 2023-07-19 DIAGNOSIS — F428 Other obsessive-compulsive disorder: Secondary | ICD-10-CM

## 2023-07-19 DIAGNOSIS — F331 Major depressive disorder, recurrent, moderate: Secondary | ICD-10-CM | POA: Diagnosis not present

## 2023-07-19 NOTE — Progress Notes (Signed)
[facility] Counselor/Therapist Progress Note  Patient ID: JENAIYA LIPS, MRN: [id number],    Date: 07/19/2023  Time spent: 55 minutes  Time In:  1:05  Time out:2:00  Treatment Type: Individual Therapy  Reported Symptoms: sadness,   Mental Status Exam: Appearance:  Casual     Behavior: Appropriate  Motor: Normal  Speech/Language:  Normal Rate  Affect: blunted  Mood: pleasant  Thought process: normal  Thought content:   WNL  Sensory/Perceptual disturbances:   WNL  Orientation: oriented to person, place, time/date, and situation  Attention: Good  Concentration: Good  Memory: WNL  Fund of knowledge:  Good  Insight:   Good  Judgment:  Good  Impulse Control: Good   Risk Assessment: Danger to Self:  No Self-injurious Behavior: No Danger to Others: No Duty to Warn:no Physical Aggression / Violence:No  Access to Firearms a concern: No  Gang Involvement:No   Subjective: The patient attended a face-to-face individual therapy session today via video visit .  The patient gave verbal consent for the video visit to be on Caregility and the patient is aware of the limitations of telehealth.  The patient was in her home alone and the therapist was in the office.   Patient presents with a blunted affect and mood is pleasant .  The patient reports that things have been pretty steady since I saw her last.  She talked about her situation with her wife and her wife has a friend at work that she has been stressed about and interested in talking to my patient about.  We talked about this being a little awkward because it is someone that she is interested in romantically but she is gay and this is a meal and it has brought up a lot of the patient's wife's feelings about how she was raised.  We talked about being able to set some limits and boundaries around the circumstance as her wife has a tendency to tell her everything which is good however it also sort of makes my patient  uncomfortable at times.  The patient understood the concepts discussed and we talked about the need for better boundaries around these kinds of conversations.   Interventions: Cognitive Behavioral Therapy, Assertiveness/Communication, and Problem solving, psycho education  Diagnosis:Major depressive disorder, recurrent episode, moderate (HCC)  Obsessional thoughts  Plan: Treatment Plan  Strengths/Abilities:  Intelligent, ability for insight, supportive wife  Treatment Preferences:  Outpatient Individual therapy  Statement of Needs:  "I Need help with my depression and motivation. "  Symptoms:Depressed or irritable mood.:(Status: improved). Diminished interest in or  enjoyment of activities.(Status: improved). Feelings of hopelessness,  worthlessness, or inappropriate guilt.: (Status: improved). History of chronic or recurrent depression for which the client has taken antidepressant medication, been hospitalized, had  outpatient treatment, or had a course of electroconvulsive therapy.(Status:  maintained). Lack of energy.: (Status: maintained). Low self-esteem.:  (Status: improved). Poor concentration and indecisiveness.:  (Status: improved). Social withdrawal.:  (Status: maintained).  Unresolved grief issues.: (Status: maintained).  Problems Addressed:  Unipolar Depression  Goals:  LTG:1. Alleviate depressive symptoms and return to previous level of effective  functioning.  70% 2. Appropriately grieve the loss in order to normalize mood and to return  to previously adaptive level of functioning. 3. Develop healthy interpersonal relationships that lead to the alleviation  and help prevent the relapse of depression.  70% 4. Develop healthy thinking patterns and beliefs about self, others, and the world that lead to the alleviation and help prevent the  relapse of  Depression  70% STG:1.Describe current and past experiences with depression including their impact on functioning and   attempts to resolve it.  70 % 2. Identify and replace thoughts and beliefs that support depression.  70 % 3.Learn and implement behavioral strategies to overcome depression. 70% 4.Verbalize an understanding of healthy and unhealthy emotions with the intent of increasing the use of  healthy emotions to guide actions.  50 %  Target Date:  10/08/2023 Frequency: Bi Weekly Modality:Individual Interventions by Therapist:  CBT, problem solving, EMDR, insight oriented  Patient approved Treatment Plan  Jacia Sickman Harrel Lemon, LCSW

## 2023-07-20 ENCOUNTER — Other Ambulatory Visit (HOSPITAL_COMMUNITY): Payer: Self-pay

## 2023-07-20 ENCOUNTER — Other Ambulatory Visit: Payer: Self-pay

## 2023-07-28 ENCOUNTER — Other Ambulatory Visit: Payer: Self-pay | Admitting: Adult Health

## 2023-07-28 ENCOUNTER — Other Ambulatory Visit: Payer: Self-pay

## 2023-07-28 ENCOUNTER — Other Ambulatory Visit (HOSPITAL_COMMUNITY): Payer: Self-pay

## 2023-07-28 DIAGNOSIS — F331 Major depressive disorder, recurrent, moderate: Secondary | ICD-10-CM

## 2023-07-28 MED ORDER — LAMOTRIGINE 150 MG PO TABS
150.0000 mg | ORAL_TABLET | Freq: Every day | ORAL | 0 refills | Status: DC
Start: 2023-07-28 — End: 2023-09-04
  Filled 2023-07-28: qty 30, 30d supply, fill #0

## 2023-08-02 ENCOUNTER — Ambulatory Visit (INDEPENDENT_AMBULATORY_CARE_PROVIDER_SITE_OTHER): Payer: BC Managed Care – PPO | Admitting: Psychology

## 2023-08-02 DIAGNOSIS — F428 Other obsessive-compulsive disorder: Secondary | ICD-10-CM | POA: Diagnosis not present

## 2023-08-02 DIAGNOSIS — F331 Major depressive disorder, recurrent, moderate: Secondary | ICD-10-CM | POA: Diagnosis not present

## 2023-08-02 NOTE — Progress Notes (Signed)
[facility] Counselor/Therapist Progress Note  Patient ID: Alexandra Beck, MRN: [id number],    Date: 08/02/2023  Time spent:72minutes  Time In:  1:00  Time out:2:00  Treatment Type: Individual Therapy  Reported Symptoms: sadness,   Mental Status Exam: Appearance:  Casual     Behavior: Appropriate  Motor: Normal  Speech/Language:  Normal Rate  Affect: blunted  Mood: pleasant  Thought process: normal  Thought content:   WNL  Sensory/Perceptual disturbances:   WNL  Orientation: oriented to person, place, time/date, and situation  Attention: Good  Concentration: Good  Memory: WNL  Fund of knowledge:  Good  Insight:   Good  Judgment:  Good  Impulse Control: Good   Risk Assessment: Danger to Self:  No Self-injurious Behavior: No Danger to Others: No Duty to Warn:no Physical Aggression / Violence:No  Access to Firearms a concern: No  Gang Involvement:No   Subjective: The patient attended a face-to-face individual therapy session today via video visit .  The patient gave verbal consent for the video visit to be on Caregility and the patient is aware of the limitations of telehealth.  The patient was in her home alone and the therapist was in the office.   Patient presents with a blunted affect and mood is pleasant .  The patient reports that things seem to be going better.  She does not seem to be as self-deprecating in sessions and is not crying as much.  The patient reports that she did have a conversation with her wife after we spoke the last time.  She says that she told her about her feelings after they had had sex and her wife talking about the man at work.  I was very happy that she set the limit with her wife as to what was appropriate and what was not appropriate.  I also gave the patient a lot of feedback about her emotional intelligence and she does seem to have good emotional intelligence.  I encouraged her to continue to develop that but also to have  boundaries around that.    Interventions: Cognitive Behavioral Therapy, Assertiveness/Communication, and Problem solving, psycho education  Diagnosis:Major depressive disorder, recurrent episode, moderate (HCC)  Obsessional thoughts  Plan: Treatment Plan  Strengths/Abilities:  Intelligent, ability for insight, supportive wife  Treatment Preferences:  Outpatient Individual therapy  Statement of Needs:  "I Need help with my depression and motivation. "  Symptoms:Depressed or irritable mood.:(Status: improved). Diminished interest in or  enjoyment of activities.(Status: improved). Feelings of hopelessness,  worthlessness, or inappropriate guilt.: (Status: improved). History of chronic or recurrent depression for which the client has taken antidepressant medication, been hospitalized, had  outpatient treatment, or had a course of electroconvulsive therapy.(Status:  maintained). Lack of energy.: (Status: maintained). Low self-esteem.:  (Status: improved). Poor concentration and indecisiveness.:  (Status: improved). Social withdrawal.:  (Status: maintained).  Unresolved grief issues.: (Status: maintained).  Problems Addressed:  Unipolar Depression  Goals:  LTG:1. Alleviate depressive symptoms and return to previous level of effective  functioning.  70% 2. Appropriately grieve the loss in order to normalize mood and to return  to previously adaptive level of functioning. 3. Develop healthy interpersonal relationships that lead to the alleviation  and help prevent the relapse of depression.  70% 4. Develop healthy thinking patterns and beliefs about self, others, and the world that lead to the alleviation and help prevent the relapse of  Depression  70% STG:1.Describe current and past experiences with depression including their impact on functioning  and  attempts to resolve it.  70 % 2. Identify and replace thoughts and beliefs that support depression.  70 % 3.Learn and implement  behavioral strategies to overcome depression. 70% 4.Verbalize an understanding of healthy and unhealthy emotions with the intent of increasing the use of  healthy emotions to guide actions.  50 %  Target Date:  10/08/2023 Frequency: Bi Weekly Modality:Individual Interventions by Therapist:  CBT, problem solving, EMDR, insight oriented  Patient approved Treatment Plan  [name] Harrel Lemon, LCSW

## 2023-08-14 ENCOUNTER — Other Ambulatory Visit (HOSPITAL_COMMUNITY): Payer: Self-pay

## 2023-08-16 ENCOUNTER — Ambulatory Visit (INDEPENDENT_AMBULATORY_CARE_PROVIDER_SITE_OTHER): Payer: BC Managed Care – PPO | Admitting: Psychology

## 2023-08-16 DIAGNOSIS — F331 Major depressive disorder, recurrent, moderate: Secondary | ICD-10-CM | POA: Diagnosis not present

## 2023-08-16 DIAGNOSIS — F428 Other obsessive-compulsive disorder: Secondary | ICD-10-CM | POA: Diagnosis not present

## 2023-08-16 NOTE — Progress Notes (Unsigned)
                Alexandra Beck Alexandra Vernice Bowker, LCSW

## 2023-08-18 ENCOUNTER — Encounter (INDEPENDENT_AMBULATORY_CARE_PROVIDER_SITE_OTHER): Payer: Self-pay | Admitting: Family Medicine

## 2023-08-21 ENCOUNTER — Ambulatory Visit (INDEPENDENT_AMBULATORY_CARE_PROVIDER_SITE_OTHER): Payer: BC Managed Care – PPO | Admitting: Family Medicine

## 2023-08-30 ENCOUNTER — Ambulatory Visit: Payer: BC Managed Care – PPO | Admitting: Psychology

## 2023-08-30 DIAGNOSIS — F422 Mixed obsessional thoughts and acts: Secondary | ICD-10-CM

## 2023-08-30 DIAGNOSIS — F331 Major depressive disorder, recurrent, moderate: Secondary | ICD-10-CM | POA: Diagnosis not present

## 2023-08-30 DIAGNOSIS — F428 Other obsessive-compulsive disorder: Secondary | ICD-10-CM

## 2023-08-30 NOTE — Progress Notes (Unsigned)
                Alexandra Beck [name], LCSW

## 2023-09-04 ENCOUNTER — Other Ambulatory Visit: Payer: Self-pay | Admitting: Adult Health

## 2023-09-04 ENCOUNTER — Other Ambulatory Visit: Payer: Self-pay

## 2023-09-04 ENCOUNTER — Other Ambulatory Visit (HOSPITAL_COMMUNITY): Payer: Self-pay

## 2023-09-04 DIAGNOSIS — F331 Major depressive disorder, recurrent, moderate: Secondary | ICD-10-CM

## 2023-09-04 NOTE — Telephone Encounter (Signed)
Sent MyChart message to schedule an appt

## 2023-09-05 ENCOUNTER — Other Ambulatory Visit (HOSPITAL_COMMUNITY): Payer: Self-pay

## 2023-09-05 MED ORDER — LAMOTRIGINE 150 MG PO TABS
150.0000 mg | ORAL_TABLET | Freq: Every day | ORAL | 0 refills | Status: DC
Start: 2023-09-05 — End: 2023-10-01
  Filled 2023-09-05: qty 30, 30d supply, fill #0

## 2023-09-06 ENCOUNTER — Other Ambulatory Visit: Payer: Self-pay

## 2023-09-13 ENCOUNTER — Ambulatory Visit (INDEPENDENT_AMBULATORY_CARE_PROVIDER_SITE_OTHER): Payer: BC Managed Care – PPO | Admitting: Psychology

## 2023-09-13 DIAGNOSIS — F428 Other obsessive-compulsive disorder: Secondary | ICD-10-CM

## 2023-09-13 DIAGNOSIS — F422 Mixed obsessional thoughts and acts: Secondary | ICD-10-CM | POA: Diagnosis not present

## 2023-09-13 DIAGNOSIS — F331 Major depressive disorder, recurrent, moderate: Secondary | ICD-10-CM | POA: Diagnosis not present

## 2023-09-13 NOTE — Progress Notes (Unsigned)
                Severiano Utsey G [name] Kulzer, LCSW

## 2023-09-15 ENCOUNTER — Ambulatory Visit: Payer: BC Managed Care – PPO | Admitting: Internal Medicine

## 2023-09-15 ENCOUNTER — Encounter: Payer: Self-pay | Admitting: Internal Medicine

## 2023-09-15 VITALS — BP 114/80 | HR 70 | Temp 98.2°F | Ht 63.0 in | Wt >= 6400 oz

## 2023-09-15 DIAGNOSIS — R1032 Left lower quadrant pain: Secondary | ICD-10-CM | POA: Diagnosis not present

## 2023-09-15 NOTE — Progress Notes (Signed)
   Subjective:   Patient ID: Alexandra Beck, female    DOB: 06/16/1981, 42 y.o.   MRN: [id number]  HPI The patient is a 42 YO female coming in for lower abdomen pain going on 3-4 weeks and not improving. Having some constipation relative to normal. No fevers. No blood in stool. Pain not related to food. Having daily BM but previously 3 times a day.   Review of Systems  Constitutional: Negative.   HENT: Negative.    Eyes: Negative.   Respiratory:  Negative for cough, chest tightness and shortness of breath.   Cardiovascular:  Negative for chest pain, palpitations and leg swelling.  Gastrointestinal:  Positive for abdominal distention, abdominal pain, constipation and rectal pain. Negative for anal bleeding, blood in stool, diarrhea, nausea and vomiting.  Musculoskeletal: Negative.   Skin: Negative.   Neurological: Negative.   Psychiatric/Behavioral: Negative.      Objective:  Physical Exam Constitutional:      Appearance: She is well-developed.  HENT:     Head: Normocephalic and atraumatic.  Cardiovascular:     Rate and Rhythm: Normal rate and regular rhythm.  Pulmonary:     Effort: Pulmonary effort is normal. No respiratory distress.     Breath sounds: Normal breath sounds. No wheezing or rales.  Abdominal:     General: Bowel sounds are normal. There is no distension.     Palpations: Abdomen is soft.     Tenderness: There is abdominal tenderness. There is no rebound.     Comments: Tenderness left flank and left lower quadrant  Musculoskeletal:     Cervical back: Normal range of motion.  Skin:    General: Skin is warm and dry.  Neurological:     Mental Status: She is alert and oriented to person, place, and time.     Coordination: Coordination normal.     Vitals:   09/15/23 1005  BP: 114/80  Pulse: 70  Temp: 98.2 F (36.8 C)  TempSrc: Oral  SpO2: 94%  Weight: (!) 414 lb (187.8 kg)  Height: 5\' 3"  (1.6 m)    Assessment & Plan:

## 2023-09-15 NOTE — Assessment & Plan Note (Signed)
LLQ and needs stat CT abdomen and pelvis concern for diverticulitis. Treat as appropriate.

## 2023-09-18 ENCOUNTER — Ambulatory Visit
Admission: RE | Admit: 2023-09-18 | Discharge: 2023-09-18 | Disposition: A | Discharge status: Home / Self Care | Payer: BC Managed Care – PPO | Source: Ambulatory Visit | Attending: Internal Medicine | Admitting: Internal Medicine

## 2023-09-18 ENCOUNTER — Ambulatory Visit (INDEPENDENT_AMBULATORY_CARE_PROVIDER_SITE_OTHER): Payer: BC Managed Care – PPO | Admitting: Physician Assistant

## 2023-09-18 ENCOUNTER — Other Ambulatory Visit (HOSPITAL_COMMUNITY): Payer: Self-pay

## 2023-09-18 ENCOUNTER — Other Ambulatory Visit: Payer: Self-pay

## 2023-09-18 ENCOUNTER — Other Ambulatory Visit: Payer: Self-pay | Admitting: Internal Medicine

## 2023-09-18 ENCOUNTER — Encounter (INDEPENDENT_AMBULATORY_CARE_PROVIDER_SITE_OTHER): Payer: Self-pay | Admitting: Physician Assistant

## 2023-09-18 VITALS — BP 118/71 | HR 68 | Temp 98.7°F | Ht 62.5 in | Wt >= 6400 oz

## 2023-09-18 DIAGNOSIS — R1030 Lower abdominal pain, unspecified: Secondary | ICD-10-CM

## 2023-09-18 DIAGNOSIS — K5720 Diverticulitis of large intestine with perforation and abscess without bleeding: Secondary | ICD-10-CM

## 2023-09-18 DIAGNOSIS — E1165 Type 2 diabetes mellitus with hyperglycemia: Secondary | ICD-10-CM

## 2023-09-18 DIAGNOSIS — Z7985 Long-term (current) use of injectable non-insulin antidiabetic drugs: Secondary | ICD-10-CM

## 2023-09-18 DIAGNOSIS — E1169 Type 2 diabetes mellitus with other specified complication: Secondary | ICD-10-CM

## 2023-09-18 DIAGNOSIS — E559 Vitamin D deficiency, unspecified: Secondary | ICD-10-CM | POA: Diagnosis not present

## 2023-09-18 DIAGNOSIS — R1032 Left lower quadrant pain: Secondary | ICD-10-CM

## 2023-09-18 DIAGNOSIS — Z6841 Body Mass Index (BMI) 40.0 and over, adult: Secondary | ICD-10-CM

## 2023-09-18 MED ORDER — SEMAGLUTIDE (2 MG/DOSE) 8 MG/3ML ~~LOC~~ SOPN
2.0000 mg | PEN_INJECTOR | SUBCUTANEOUS | 0 refills | Status: DC
  Filled 2023-09-18 – 2023-10-01 (×2): qty 9, 84d supply, fill #0
  Filled 2023-10-06: qty 3, 28d supply, fill #0
  Filled 2023-10-06: qty 9, 84d supply, fill #0
  Filled 2023-11-05: qty 3, 28d supply, fill #1
  Filled 2023-12-10: qty 3, 28d supply, fill #2

## 2023-09-18 MED ORDER — AMOXICILLIN-POT CLAVULANATE 875-125 MG PO TABS
1.0000 | ORAL_TABLET | Freq: Two times a day (BID) | ORAL | 0 refills | Status: DC
  Filled 2023-09-18: qty 20, 10d supply, fill #0

## 2023-09-18 MED ORDER — VITAMIN D (ERGOCALCIFEROL) 1.25 MG (50000 UNIT) PO CAPS
50000.0000 [IU] | ORAL_CAPSULE | ORAL | 0 refills | Status: DC
  Filled 2023-09-18: qty 4, 28d supply, fill #0

## 2023-09-18 NOTE — Progress Notes (Unsigned)
.smr  Office: [phone]  /  Fax: [phone]  WEIGHT SUMMARY AND BIOMETRICS  Vitals Temp: 98.7 F (37.1 C) BP: 118/71 Pulse Rate: 68 SpO2: 94 %   Anthropometric Measurements Height: 5' 2.5" (1.588 m) (rechecked height today) Weight: (!) 414 lb (187.8 kg) BMI (Calculated): 74.47 Weight at Last Visit: 441 lb Weight Lost Since Last Visit: 27 lb Weight Gained Since Last Visit: 0 Starting Weight: 468 lb Total Weight Loss (lbs): 54 lb (24.5 kg) Peak Weight: 504 lb   Body Composition  Body Fat %: 65.8 % Fat Mass (lbs): 272.6 lbs Muscle Mass (lbs): 134.4 lbs Visceral Fat Rating : 35   Other Clinical Data Today's Visit #: 21 Starting Date: 04/17/18     HPI  Chief Complaint: OBESITY  Alexandra Beck is here to discuss her progress with her obesity treatment plan. She is on the the Category 4 Plan and practicing portion control and making smarter food choices, such as increasing vegetables and decreasing simple carbohydrates and states she is following her eating plan approximately 75 % of the time. She states she is exercising 0 minutes 0 times per week.  Discussed the use of AI scribe software for clinical note transcription with the patient, who gave verbal consent to proceed.  History of Present Illness     Interval History:  Since last office visit she is down 27 lbs.  Last in office weight was 06/05/23  The patient, with a history of obesity, type two diabetes, vitamin D deficiency, hyperlipidemia, and hypertension, presents for a follow-up visit.   The patient has been experiencing abdominal discomfort, described as pressure in the intestine, similar to gas cramps, but without passing any gas. The patient also reports changes in bowel movements, with a history of urgent, watery diarrhea after eating, but recently experiencing difficulty and straining to pass small, solid stools. She has been evaluated by her PCP and underwent an ABD CT scan with oral contrast just this  morning.   The patient also mentions a sensation of incomplete urination. The patient denies any fever, burning sensation during urination, or vomiting. The patient also reports waking up due to discomfort from carpal tunnel syndrome.  CT ABD with oral contrast done this am is pending.   Pharmacotherapy: Ozempic 2 mg weekly on Fridays. Denies mass in neck, dysphagia, dyspepsia, persistent hoarseness, abdominal pain, or N/V/Constipation .Has annual eye exam. Mood is stable.    TREATMENT PLAN FOR OBESITY: Obesity Significant weight loss achieved (total of 54 pounds) with Ozempic and lifestyle modifications. -Continue Ozempic 2mg  weekly  (semaglutide). -Encourage continued lifestyle modifications for weight loss. Recommended Dietary Goals  Alexandra Beck is currently in the action stage of change. As such, her goal is to continue weight management plan. She has agreed to practicing portion control and making smarter food choices, such as increasing vegetables and decreasing simple carbohydrates.  Behavioral Intervention  We discussed the following Behavioral Modification Strategies today: continue to work on maintaining a reduced calorie state, getting the recommended amount of protein, incorporating whole foods, making healthy choices, staying well hydrated and practicing mindfulness when eating..  Additional resources provided today: NA  Recommended Physical Activity Goals  Alexandra Beck has been advised to work up to 150 minutes of moderate intensity aerobic activity a week and strengthening exercises 2-3 times per week for cardiovascular health, weight loss maintenance and preservation of muscle mass.   She has agreed to Think about enjoyable ways to increase daily physical activity and overcoming barriers to exercise and Increase physical activity  in their day and reduce sedentary time (increase NEAT).   Pharmacotherapy We discussed various medication options to help Alexandra Beck with her weight loss  efforts and we both agreed to continue Ozempic 2 mg weekly and await results of CT scan of ABD.    Return in about 4 weeks (around 10/16/2023).Marland Kitchen She was informed of the importance of frequent follow up visits to maximize her success with intensive lifestyle modifications for her multiple health conditions.  PHYSICAL EXAM:  Blood pressure 118/71, pulse 68, temperature 98.7 F (37.1 C), height 5' 2.5" (1.588 m), weight (!) 414 lb (187.8 kg), last menstrual period 09/08/2023, SpO2 94%. Body mass index is 74.51 kg/m.  General: She is overweight, cooperative, alert, well developed, and in no acute distress. PSYCH: Has normal mood, affect and thought process.   Cardiovascular: HR 60's BP 118/71. Lungs: Normal breathing effort, no conversational dyspnea.  DIAGNOSTIC DATA REVIEWED:  BMET    Component Value Date/Time   NA 141 03/13/2023 1511   K 4.6 03/13/2023 1511   CL 103 03/13/2023 1511   CO2 21 03/13/2023 1511   GLUCOSE 106 (H) 03/13/2023 1511   GLUCOSE 108 (H) 09/23/2022 1134   BUN 16 03/13/2023 1511   CREATININE 1.03 (H) 03/13/2023 1511   CREATININE 1.06 11/14/2016 1050   CALCIUM 9.4 03/13/2023 1511   CALCIUM 9.6 06/26/2007 0000   GFRNONAA >60 05/17/2022 0351   GFRAA 81 11/11/2020 1211   Lab Results  Component Value Date   HGBA1C 6.0 (H) 03/13/2023   HGBA1C 6.0 (H) 06/29/2011   Lab Results  Component Value Date   INSULIN 28.9 (H) 03/13/2023   INSULIN 25.0 (H) 04/17/2018   Lab Results  Component Value Date   TSH 2.040 03/13/2023   CBC    Component Value Date/Time   WBC 7.2 03/13/2023 1511   WBC 7.8 09/23/2022 1134   RBC 3.90 03/13/2023 1511   RBC 4.24 09/23/2022 1134   HGB 10.9 (L) 03/13/2023 1511   HCT 35.1 03/13/2023 1511   PLT 209 03/13/2023 1511   MCV 90 03/13/2023 1511   MCH 27.9 03/13/2023 1511   MCH 26.4 05/17/2022 0351   MCHC 31.1 (L) 03/13/2023 1511   MCHC 31.6 09/23/2022 1134   RDW 15.1 03/13/2023 1511   Iron Studies    Component Value  Date/Time   IRON 58 03/13/2023 1511   TIBC 385 03/13/2023 1511   FERRITIN 45 03/13/2023 1511   IRONPCTSAT 15 03/13/2023 1511   Lipid Panel     Component Value Date/Time   CHOL 176 03/13/2023 1511   TRIG 158 (H) 03/13/2023 1511   HDL 58 03/13/2023 1511   CHOLHDL 3 06/17/2022 1438   VLDL 37.4 06/17/2022 1438   LDLCALC 91 03/13/2023 1511   LDLDIRECT 141.0 03/02/2016 1101   Hepatic Function Panel     Component Value Date/Time   PROT 6.6 03/13/2023 1511   ALBUMIN 3.8 (L) 03/13/2023 1511   AST 9 03/13/2023 1511   ALT 12 03/13/2023 1511   ALKPHOS 88 03/13/2023 1511   BILITOT 0.2 03/13/2023 1511   BILIDIR 0.1 09/23/2022 1134   IBILI 0.5 12/25/2012 0223      Component Value Date/Time   TSH 2.040 03/13/2023 1511   Nutritional Lab Results  Component Value Date   VD25OH 22.3 (L) 03/13/2023   VD25OH 30.2 11/11/2020   VD25OH 38.2 12/17/2019    ASSOCIATED CONDITIONS ADDRESSED TODAY  ASSESSMENT AND PLAN  Problem List Items Addressed This Visit     Morbid obesity (HCC)  Relevant Medications   Semaglutide, 2 MG/DOSE, 8 MG/3ML SOPN   Vitamin D deficiency   Relevant Medications   Vitamin D, Ergocalciferol, (DRISDOL) 1.25 MG (50000 UNIT) CAPS capsule   Diabetes mellitus (HCC) - Primary   Relevant Medications   Semaglutide, 2 MG/DOSE, 8 MG/3ML SOPN  Abdominal Pain Chronic, lower abdominal discomfort with associated changes in bowel habits. Recent CT scan performed, awaiting radiologist report. ? diverticulitis  -Await radiologist report on CT scan. -Consider colonoscopy based on radiologist report and ongoing symptoms. -Continue Ozempic (semaglutide) for now, monitor for worsening symptoms.    Type 2 Diabetes Mellitus with other specified complication, without long-term current use of insulin HgbA1c is at goal. Last A1c was 6.0  Medication(s): Ozempic 2 mg SQ weekly Denies mass in neck, dysphagia, dyspepsia, persistent hoarseness or N/V/Constipation .Marland Kitchen Has annual eye  exam. Mood is stable.  Reports intermittent episodes of abdominal pain and diarrhea and work up per PCP including CT of ABD which was done this am are pending.  She is working  on nutrition plan to decrease simple carbohydrates, increase lean proteins and exercise to promote weight loss and improve glycemic control .   Lab Results  Component Value Date   HGBA1C 6.0 (H) 03/13/2023   HGBA1C 5.8 (H) 05/13/2022   HGBA1C 5.9 (H) 11/11/2020   Lab Results  Component Value Date   MICROALBUR 8.05 (H) 06/26/2007   LDLCALC 91 03/13/2023   CREATININE 1.03 (H) 03/13/2023   Lab Results  Component Value Date   GFR 67.95 09/23/2022   GFR 83.01 03/23/2018   GFR 71.50 09/16/2016    Plan: Continue and refill Ozempic 2 mg SQ weeklyx 90 days Continue working  on nutrition plan to decrease simple carbohydrates, increase lean proteins and exercise to promote weight loss and improve glycemic control .  Await results of CT of ABD ordered per PCP.     Vitamin D Deficiency Vitamin D is not at goal of 50.  Most recent vitamin D level was 22.3. She is on  prescription ergocalciferol 50,000 IU weekly. Lab Results  Component Value Date   VD25OH 22.3 (L) 03/13/2023   VD25OH 30.2 11/11/2020   VD25OH 38.2 12/17/2019    Plan: Continue and refill  prescription ergocalciferol 50,000 IU weekly Low vitamin D levels can be associated with adiposity and may result in leptin resistance and weight gain. Also associated with fatigue. Currently on vitamin D supplementation without any adverse effects.    Carpal Tunnel Syndrome Reports waking up due to discomfort in hand and arm. -Consider obtaining a new wrist brace. -Consider referral to occupational therapy for custom orthosis.  Sleep Apnea Managed with CPAP. -Continue CPAP use.  General Health Maintenance -Plan for fasting labs next visit.  ATTESTASTION STATEMENTS:  Reviewed by clinician on day of visit: allergies, medications, problem list,  medical history, surgical history, family history, social history, and previous encounter notes.   I have personally spent 50 minutes total time today in preparation, patient care, nutritional counseling and documentation for this visit, including the following: review of clinical lab tests; review of medical tests/procedures/services.      Jadyn Barge, PA-C

## 2023-09-19 ENCOUNTER — Other Ambulatory Visit: Payer: Self-pay

## 2023-09-19 ENCOUNTER — Encounter: Payer: Self-pay | Admitting: Pharmacist

## 2023-09-19 ENCOUNTER — Other Ambulatory Visit (HOSPITAL_COMMUNITY): Payer: Self-pay

## 2023-09-20 ENCOUNTER — Ambulatory Visit (INDEPENDENT_AMBULATORY_CARE_PROVIDER_SITE_OTHER): Payer: BC Managed Care – PPO | Admitting: Psychology

## 2023-09-20 DIAGNOSIS — F428 Other obsessive-compulsive disorder: Secondary | ICD-10-CM

## 2023-09-20 DIAGNOSIS — F331 Major depressive disorder, recurrent, moderate: Secondary | ICD-10-CM

## 2023-09-20 NOTE — Progress Notes (Unsigned)
                Pascual Mantel G Majed Pellegrin, LCSW

## 2023-09-21 ENCOUNTER — Other Ambulatory Visit: Payer: Self-pay

## 2023-09-21 ENCOUNTER — Emergency Department (HOSPITAL_COMMUNITY): Payer: BC Managed Care – PPO

## 2023-09-21 ENCOUNTER — Inpatient Hospital Stay (HOSPITAL_COMMUNITY)
Admission: EM | Admit: 2023-09-21 | Discharge: 2023-09-28 | DRG: 391 | Disposition: A | Discharge status: Home Health | Payer: BC Managed Care – PPO | Attending: Internal Medicine | Admitting: Internal Medicine

## 2023-09-21 DIAGNOSIS — Z79890 Hormone replacement therapy (postmenopausal): Secondary | ICD-10-CM | POA: Diagnosis not present

## 2023-09-21 DIAGNOSIS — Z23 Encounter for immunization: Secondary | ICD-10-CM | POA: Diagnosis not present

## 2023-09-21 DIAGNOSIS — K5792 Diverticulitis of intestine, part unspecified, without perforation or abscess without bleeding: Secondary | ICD-10-CM | POA: Diagnosis present

## 2023-09-21 DIAGNOSIS — Z79899 Other long term (current) drug therapy: Secondary | ICD-10-CM | POA: Diagnosis not present

## 2023-09-21 DIAGNOSIS — K8020 Calculus of gallbladder without cholecystitis without obstruction: Secondary | ICD-10-CM | POA: Diagnosis present

## 2023-09-21 DIAGNOSIS — D649 Anemia, unspecified: Secondary | ICD-10-CM | POA: Diagnosis present

## 2023-09-21 DIAGNOSIS — I152 Hypertension secondary to endocrine disorders: Secondary | ICD-10-CM | POA: Diagnosis present

## 2023-09-21 DIAGNOSIS — E1169 Type 2 diabetes mellitus with other specified complication: Secondary | ICD-10-CM | POA: Diagnosis present

## 2023-09-21 DIAGNOSIS — K651 Peritoneal abscess: Secondary | ICD-10-CM | POA: Diagnosis present

## 2023-09-21 DIAGNOSIS — F32A Depression, unspecified: Secondary | ICD-10-CM | POA: Diagnosis present

## 2023-09-21 DIAGNOSIS — Z888 Allergy status to other drugs, medicaments and biological substances status: Secondary | ICD-10-CM

## 2023-09-21 DIAGNOSIS — E785 Hyperlipidemia, unspecified: Secondary | ICD-10-CM | POA: Diagnosis present

## 2023-09-21 DIAGNOSIS — Z833 Family history of diabetes mellitus: Secondary | ICD-10-CM

## 2023-09-21 DIAGNOSIS — G47419 Narcolepsy without cataplexy: Secondary | ICD-10-CM | POA: Diagnosis present

## 2023-09-21 DIAGNOSIS — E1122 Type 2 diabetes mellitus with diabetic chronic kidney disease: Secondary | ICD-10-CM | POA: Diagnosis present

## 2023-09-21 DIAGNOSIS — Z1152 Encounter for screening for COVID-19: Secondary | ICD-10-CM

## 2023-09-21 DIAGNOSIS — E876 Hypokalemia: Secondary | ICD-10-CM | POA: Diagnosis not present

## 2023-09-21 DIAGNOSIS — K5720 Diverticulitis of large intestine with perforation and abscess without bleeding: Principal | ICD-10-CM | POA: Diagnosis present

## 2023-09-21 DIAGNOSIS — N179 Acute kidney failure, unspecified: Secondary | ICD-10-CM | POA: Diagnosis present

## 2023-09-21 DIAGNOSIS — E1159 Type 2 diabetes mellitus with other circulatory complications: Secondary | ICD-10-CM | POA: Diagnosis present

## 2023-09-21 DIAGNOSIS — G4733 Obstructive sleep apnea (adult) (pediatric): Secondary | ICD-10-CM | POA: Diagnosis present

## 2023-09-21 DIAGNOSIS — Z792 Long term (current) use of antibiotics: Secondary | ICD-10-CM

## 2023-09-21 DIAGNOSIS — Z83438 Family history of other disorder of lipoprotein metabolism and other lipidemia: Secondary | ICD-10-CM

## 2023-09-21 DIAGNOSIS — Z7984 Long term (current) use of oral hypoglycemic drugs: Secondary | ICD-10-CM | POA: Diagnosis not present

## 2023-09-21 DIAGNOSIS — Z8349 Family history of other endocrine, nutritional and metabolic diseases: Secondary | ICD-10-CM

## 2023-09-21 DIAGNOSIS — Z933 Colostomy status: Secondary | ICD-10-CM | POA: Diagnosis not present

## 2023-09-21 DIAGNOSIS — K219 Gastro-esophageal reflux disease without esophagitis: Secondary | ICD-10-CM | POA: Diagnosis present

## 2023-09-21 DIAGNOSIS — E039 Hypothyroidism, unspecified: Secondary | ICD-10-CM | POA: Diagnosis present

## 2023-09-21 DIAGNOSIS — Z881 Allergy status to other antibiotic agents status: Secondary | ICD-10-CM

## 2023-09-21 DIAGNOSIS — Z8249 Family history of ischemic heart disease and other diseases of the circulatory system: Secondary | ICD-10-CM

## 2023-09-21 DIAGNOSIS — Z886 Allergy status to analgesic agent status: Secondary | ICD-10-CM

## 2023-09-21 DIAGNOSIS — Z6841 Body Mass Index (BMI) 40.0 and over, adult: Secondary | ICD-10-CM

## 2023-09-21 DIAGNOSIS — E662 Morbid (severe) obesity with alveolar hypoventilation: Secondary | ICD-10-CM | POA: Diagnosis present

## 2023-09-21 DIAGNOSIS — R1084 Generalized abdominal pain: Secondary | ICD-10-CM

## 2023-09-21 DIAGNOSIS — Z87891 Personal history of nicotine dependence: Secondary | ICD-10-CM | POA: Diagnosis not present

## 2023-09-21 DIAGNOSIS — I5032 Chronic diastolic (congestive) heart failure: Secondary | ICD-10-CM | POA: Diagnosis present

## 2023-09-21 LAB — CBC WITH DIFFERENTIAL/PLATELET
Abs Immature Granulocytes: 0.06 10*3/uL (ref 0.00–0.07)
Basophils Absolute: 0 10*3/uL (ref 0.0–0.1)
Basophils Relative: 0 %
Eosinophils Absolute: 0.1 10*3/uL (ref 0.0–0.5)
Eosinophils Relative: 1 %
HCT: 36.1 % (ref 36.0–46.0)
Hemoglobin: 10.7 g/dL — ABNORMAL LOW (ref 12.0–15.0)
Immature Granulocytes: 1 %
Lymphocytes Relative: 6 %
Lymphs Abs: 0.6 10*3/uL — ABNORMAL LOW (ref 0.7–4.0)
MCH: 26.7 pg (ref 26.0–34.0)
MCHC: 29.6 g/dL — ABNORMAL LOW (ref 30.0–36.0)
MCV: 90 fL (ref 80.0–100.0)
Monocytes Absolute: 0.5 10*3/uL (ref 0.1–1.0)
Monocytes Relative: 6 %
Neutro Abs: 8.1 10*3/uL — ABNORMAL HIGH (ref 1.7–7.7)
Neutrophils Relative %: 86 %
Platelets: 277 10*3/uL (ref 150–400)
RBC: 4.01 MIL/uL (ref 3.87–5.11)
RDW: 15.6 % — ABNORMAL HIGH (ref 11.5–15.5)
WBC: 9.4 10*3/uL (ref 4.0–10.5)
nRBC: 0 % (ref 0.0–0.2)

## 2023-09-21 LAB — RESP PANEL BY RT-PCR (RSV, FLU A&B, COVID)  RVPGX2
Influenza A by PCR: NEGATIVE
Influenza B by PCR: NEGATIVE
Resp Syncytial Virus by PCR: NEGATIVE
SARS Coronavirus 2 by RT PCR: NEGATIVE

## 2023-09-21 LAB — COMPREHENSIVE METABOLIC PANEL
ALT: 15 U/L (ref 0–44)
AST: 19 U/L (ref 15–41)
Albumin: 3.5 g/dL (ref 3.5–5.0)
Alkaline Phosphatase: 74 U/L (ref 38–126)
Anion gap: 15 (ref 5–15)
BUN: 20 mg/dL (ref 6–20)
CO2: 24 mmol/L (ref 22–32)
Calcium: 9.1 mg/dL (ref 8.9–10.3)
Chloride: 98 mmol/L (ref 98–111)
Creatinine, Ser: 1.22 mg/dL — ABNORMAL HIGH (ref 0.44–1.00)
GFR, Estimated: 56 mL/min — ABNORMAL LOW (ref >60)
Glucose, Bld: 94 mg/dL (ref 70–99)
Potassium: 3.9 mmol/L (ref 3.5–5.1)
Sodium: 137 mmol/L (ref 135–145)
Total Bilirubin: 0.5 mg/dL (ref 0.3–1.2)
Total Protein: 7.4 g/dL (ref 6.5–8.1)

## 2023-09-21 LAB — APTT: aPTT: 30 s (ref 24–36)

## 2023-09-21 LAB — HCG, SERUM, QUALITATIVE: Preg, Serum: NEGATIVE

## 2023-09-21 LAB — PROTIME-INR
INR: 1.2 (ref 0.8–1.2)
Prothrombin Time: 14.9 s (ref 11.4–15.2)

## 2023-09-21 LAB — I-STAT CG4 LACTIC ACID, ED: Lactic Acid, Venous: 1.4 mmol/L (ref 0.5–1.9)

## 2023-09-21 MED ORDER — MORPHINE SULFATE (PF) 4 MG/ML IV SOLN
4.0000 mg | Freq: Once | INTRAVENOUS | Status: AC
  Administered 2023-09-21: 4 mg via INTRAVENOUS
  Filled 2023-09-21: qty 1

## 2023-09-21 MED ORDER — ONDANSETRON HCL 4 MG/2ML IJ SOLN
4.0000 mg | Freq: Once | INTRAMUSCULAR | Status: AC
Start: 2023-09-21 — End: 2023-09-21
  Administered 2023-09-21: 4 mg via INTRAVENOUS
  Filled 2023-09-21: qty 2

## 2023-09-21 MED ORDER — SODIUM CHLORIDE 0.9% FLUSH: 10.0000 mL | Freq: Two times a day (BID) | INTRAVENOUS | Status: DC

## 2023-09-21 MED ORDER — PIPERACILLIN-TAZOBACTAM 3.375 G IVPB
3.3750 g | Freq: Three times a day (TID) | INTRAVENOUS | Status: DC
  Administered 2023-09-22 – 2023-09-27 (×17): 3.375 g via INTRAVENOUS
  Filled 2023-09-21 (×17): qty 50

## 2023-09-21 MED ORDER — METRONIDAZOLE 500 MG/100ML IV SOLN
500.0000 mg | Freq: Once | INTRAVENOUS | Status: AC
  Administered 2023-09-21: 500 mg via INTRAVENOUS
  Filled 2023-09-21: qty 100

## 2023-09-21 MED ORDER — SODIUM CHLORIDE 0.9 % IV SOLN
2.0000 g | Freq: Once | INTRAVENOUS | Status: AC
  Administered 2023-09-21: 2 g via INTRAVENOUS
  Filled 2023-09-21: qty 20

## 2023-09-21 MED ORDER — ONDANSETRON HCL 4 MG/2ML IJ SOLN
4.0000 mg | Freq: Four times a day (QID) | INTRAMUSCULAR | Status: DC | PRN
  Administered 2023-09-21 – 2023-09-24 (×4): 4 mg via INTRAVENOUS
  Filled 2023-09-21 (×4): qty 2

## 2023-09-21 MED ORDER — MORPHINE SULFATE (PF) 2 MG/ML IV SOLN
2.0000 mg | INTRAVENOUS | Status: DC | PRN
  Administered 2023-09-21 – 2023-09-22 (×2): 2 mg via INTRAVENOUS
  Filled 2023-09-21 (×2): qty 1

## 2023-09-21 MED ORDER — ACETAMINOPHEN 500 MG PO TABS
1000.0000 mg | ORAL_TABLET | Freq: Once | ORAL | Status: AC
  Administered 2023-09-21: 1000 mg via ORAL
  Filled 2023-09-21: qty 2

## 2023-09-21 MED ORDER — IOHEXOL 350 MG/ML SOLN
75.0000 mL | Freq: Once | INTRAVENOUS | Status: AC | PRN
  Administered 2023-09-21: 75 mL via INTRAVENOUS

## 2023-09-21 NOTE — ED Notes (Signed)
Patient transported to CT 

## 2023-09-21 NOTE — Progress Notes (Signed)
Pharmacy Antibiotic Note  Alexandra Beck is a 42 y.o. female admitted on 09/21/2023 with acute sigmoid diverticulitis with abscess.  Pharmacy has been consulted for Zosyn dosing.  Plan: Zosyn 3.375g IV q8h (4 hour infusion).  Height: 5\' 1"  (154.9 cm) Weight: (!) 187.8 kg (414 lb 0.4 oz) IBW/kg (Calculated) : 47.8  Temp (24hrs), Avg:100.8 F (38.2 C), Min:99.5 F (37.5 C), Max:102.5 F (39.2 C)  Recent Labs  Lab 09/21/23 1704 09/21/23 1719  WBC 9.4  --   CREATININE 1.22*  --   LATICACIDVEN  --  1.4    Estimated Creatinine Clearance: 97.4 mL/min (A) (by C-G formula based on SCr of 1.22 mg/dL (H)).    Allergies  Allergen Reactions   Aspirin Hives and Swelling    REACTION: throat swelling, hives Other reaction(s): Unknown Other reaction(s): Unknown   Doxycycline Other (See Comments)    Abdominal pain, difficulty swallowing   Lisinopril Cough   Niacin And Related     Other reaction(s): Unknown Other reaction(s): Unknown   Niaspan [Niacin Er (Antihyperlipidemic)]     Caused flushing    Thank you for allowing pharmacy to be a part of this patient's care.  Vernard Gambles, PharmD, BCPS  09/21/2023 11:24 PM

## 2023-09-21 NOTE — Consult Note (Signed)
Reason for Consult: Diverticulitis with abscess Referring Physician: Fontaine No is an 42 y.o. female.  HPI: 42 year old female with past medical history as below developed left lower quadrant abdominal pain earlier this week.  She underwent an outpatient CT scan 10/21 which showed diverticulitis with a possible air-fluid collection abutting the bladder.  She was treated with Augmentin.  She continued to have pain and was feeling poorly so she was seen at the emergency department today.  Follow-up CT scan again shows acute sigmoid diverticulitis with an abscess abutting the dome of the bladder.  I was asked to see her for surgical management.  She reports her pain is much better since she received morphine.  She has not had a colonoscopy.  Past Medical History:  Diagnosis Date   Acute renal failure (ARF) (HCC) 11/2012    multifactorial-likely secondary to ATN in the setting of sepsis and hypotension, and also from rhabdomyolysis   Anemia    Cellulitis    Chronic diastolic CHF (congestive heart failure) (HCC)    Depression    Diabetes mellitus (HCC)    GERD (gastroesophageal reflux disease)    Hyperlipidemia    Hypertension    Hypothyroidism    Morbid obesity with BMI of 70 and over, adult (HCC)    Obesity hypoventilation syndrome (HCC)    OSA (obstructive sleep apnea)    PVC's (premature ventricular contractions)    Recurrent cellulitis of lower leg    LLE, venous insuff   Sepsis (HCC) 11/2012   Secondary to cellulitis   Sleep apnea    Venous insufficiency of leg     Past Surgical History:  Procedure Laterality Date   IR FLUORO GUIDE CV MIDLINE PICC RIGHT  03/28/2017   IR US GUIDE VASC ACCESS RIGHT  03/28/2017   WISDOM TOOTH EXTRACTION      Family History  Problem Relation Age of Onset   Hypertension Mother    Diabetes Mother    High Cholesterol Mother    Thyroid disease Mother    Sleep apnea Mother    Obesity Mother    Hypertension Father    Heart disease  Father        before age 54   High Cholesterol Father    Sleep apnea Father    Obesity Father     Social History:  reports that she quit smoking about 13 years ago. Her smoking use included cigarettes. She started smoking about 21 years ago. She has a 4 pack-year smoking history. She has never used smokeless tobacco. She reports that she does not drink alcohol and does not use drugs.  Allergies:  Allergies  Allergen Reactions   Aspirin Hives and Swelling    REACTION: throat swelling, hives Other reaction(s): Unknown Other reaction(s): Unknown   Doxycycline Other (See Comments)    Abdominal pain, difficulty swallowing   Lisinopril Cough   Niacin And Related     Other reaction(s): Unknown Other reaction(s): Unknown   Niaspan [Niacin Er (Antihyperlipidemic)]     Caused flushing    Medications: I have reviewed the patient's current medications.  Results for orders placed or performed during the hospital encounter of 09/21/23 (from the past 48 hour(s))  Comprehensive metabolic panel     Status: Abnormal   Collection Time: 09/21/23  5:04 PM  Result Value Ref Range   Sodium 137 135 - 145 mmol/L   Potassium 3.9 3.5 - 5.1 mmol/L   Chloride 98 98 - 111 mmol/L   CO2  24 22 - 32 mmol/L   Glucose, Bld 94 70 - 99 mg/dL    Comment: Glucose reference range applies only to samples taken after fasting for at least 8 hours.   BUN 20 6 - 20 mg/dL   Creatinine, Ser 1.61 (H) 0.44 - 1.00 mg/dL   Calcium 9.1 8.9 - 09.6 mg/dL   Total Protein 7.4 6.5 - 8.1 g/dL   Albumin 3.5 3.5 - 5.0 g/dL   AST 19 15 - 41 U/L   ALT 15 0 - 44 U/L   Alkaline Phosphatase 74 38 - 126 U/L   Total Bilirubin 0.5 0.3 - 1.2 mg/dL   GFR, Estimated 56 (L) >60 mL/min    Comment: (NOTE) Calculated using the CKD-EPI Creatinine Equation (2021)    Anion gap 15 5 - 15    Comment: Performed at [facility], [address] [place], [place] [address]  CBC with Differential     Status: Abnormal   Collection Time:  09/21/23  5:04 PM  Result Value Ref Range   WBC 9.4 4.0 - 10.5 K/uL   RBC 4.01 3.87 - 5.11 MIL/uL   Hemoglobin 10.7 (L) 12.0 - 15.0 g/dL   HCT 98.1 19.1 - 47.8 %   MCV 90.0 80.0 - 100.0 fL   MCH 26.7 26.0 - 34.0 pg   MCHC 29.6 (L) 30.0 - 36.0 g/dL   RDW 29.5 (H) 62.1 - 30.8 %   Platelets 277 150 - 400 K/uL   nRBC 0.0 0.0 - 0.2 %   Neutrophils Relative % 86 %   Neutro Abs 8.1 (H) 1.7 - 7.7 K/uL   Lymphocytes Relative 6 %   Lymphs Abs 0.6 (L) 0.7 - 4.0 K/uL   Monocytes Relative 6 %   Monocytes Absolute 0.5 0.1 - 1.0 K/uL   Eosinophils Relative 1 %   Eosinophils Absolute 0.1 0.0 - 0.5 K/uL   Basophils Relative 0 %   Basophils Absolute 0.0 0.0 - 0.1 K/uL   Immature Granulocytes 1 %   Abs Immature Granulocytes 0.06 0.00 - 0.07 K/uL    Comment: Performed at [facility], [address], [place], [place] [address]  Protime-INR     Status: None   Collection Time: 09/21/23  5:04 PM  Result Value Ref Range   Prothrombin Time 14.9 11.4 - 15.2 seconds   INR 1.2 0.8 - 1.2    Comment: (NOTE) INR goal varies based on device and disease states. Performed at [facility], [address] [place], [place] [address]   APTT     Status: None   Collection Time: 09/21/23  5:04 PM  Result Value Ref Range   aPTT 30 24 - 36 seconds    Comment: Performed at [facility], [address], [place], [place] [address]  hCG, serum, qualitative     Status: None   Collection Time: 09/21/23  5:04 PM  Result Value Ref Range   Preg, Serum NEGATIVE NEGATIVE    Comment:        THE SENSITIVITY OF THIS METHODOLOGY IS >10 mIU/mL. Performed at [facility], [address] [place], [place] [address]   I-Stat Lactic Acid, ED     Status: None   Collection Time: 09/21/23  5:19 PM  Result Value Ref Range   Lactic Acid, Venous 1.4 0.5 - 1.9 mmol/L  Resp panel by RT-PCR (RSV, Flu A&B, Covid) Anterior Nasal Swab     Status: None  Collection Time: 09/21/23  7:59 PM   Specimen: Anterior  Nasal Swab  Result Value Ref Range   SARS Coronavirus 2 by RT PCR NEGATIVE NEGATIVE   Influenza A by PCR NEGATIVE NEGATIVE   Influenza B by PCR NEGATIVE NEGATIVE    Comment: (NOTE) The Xpert Xpress SARS-CoV-2/FLU/RSV plus assay is intended as an aid in the diagnosis of influenza from Nasopharyngeal swab specimens and should not be used as a sole basis for treatment. Nasal washings and aspirates are unacceptable for Xpert Xpress SARS-CoV-2/FLU/RSV testing.  Fact Sheet for Patients: BloggerCourse.com  Fact Sheet for Healthcare Providers: SeriousBroker.it  This test is not yet approved or cleared by the [place] FDA and has been authorized for detection and/or diagnosis of SARS-CoV-2 by FDA under an Emergency Use Authorization (EUA). This EUA will remain in effect (meaning this test can be used) for the duration of the COVID-19 declaration under Section 564(b)(1) of the Act, 21 U.S.C. section 360bbb-3(b)(1), unless the authorization is terminated or revoked.     Resp Syncytial Virus by PCR NEGATIVE NEGATIVE    Comment: (NOTE) Fact Sheet for Patients: BloggerCourse.com  Fact Sheet for Healthcare Providers: SeriousBroker.it  This test is not yet approved or cleared by the [place] FDA and has been authorized for detection and/or diagnosis of SARS-CoV-2 by FDA under an Emergency Use Authorization (EUA). This EUA will remain in effect (meaning this test can be used) for the duration of the COVID-19 declaration under Section 564(b)(1) of the Act, 21 U.S.C. section 360bbb-3(b)(1), unless the authorization is terminated or revoked.  Performed at [facility], [address] [place], [place] [address]     CT ABDOMEN PELVIS W CONTRAST  Result Date: 09/21/2023 CLINICAL DATA:  Left lower quadrant pain.  Diverticulitis. EXAM: CT ABDOMEN AND PELVIS WITH CONTRAST  TECHNIQUE: Multidetector CT imaging of the abdomen and pelvis was performed using the standard protocol following bolus administration of intravenous contrast. RADIATION DOSE REDUCTION: This exam was performed according to the departmental dose-optimization program which includes automated exposure control, adjustment of the mA and/or kV according to patient size and/or use of iterative reconstruction technique. CONTRAST:  75mL OMNIPAQUE IOHEXOL 350 MG/ML SOLN COMPARISON:  CT 03/19/2023 FINDINGS: Lower chest: The heart is borderline enlarged. No basilar airspace disease or pleural effusion. Hepatobiliary: Enlarged liver spanning 21.2 cm cranial caudal. No focal liver abnormality. Calcified gallstone without pericholecystic inflammation. No biliary dilatation. Pancreas: No ductal dilatation or inflammation. Spleen: Enlarged, 15.1 cm AP. Adrenals/Urinary Tract: No adrenal nodule. Lobulated bilateral renal contours. No hydronephrosis. No renal calculi. Moderate wall thickening about the dome of the bladder, but no definite intravesicular air. Stomach/Bowel: Moderate length segment of colonic wall thickening in the proximal sigmoid in the region of multiple diverticula, likely representing diverticulitis. There is a poorly defined extraluminal collection extending inferior to the inflamed colon abutting the dome of the bladder suspicious for abscess. This measures approximately 5.6 x 6.9 x 3.2 cm, series 3, image 72 and sagittal series 7, image 100. Collection is poorly defined with internal air and predominantly soft tissue thickening, small amount of intraluminal fluid. Ill-defined soft tissue tract of sent from the colon. Collection abuts the dome of the bladder with associated wall thickening. Multiple additional noninflamed colonic diverticula. No small bowel obstruction or inflammation. Vascular/Lymphatic: Normal caliber abdominal aorta. No evidence of portal or mesenteric thrombus. There prominent bilateral  inguinal and external iliac nodes are likely reactive. Reproductive: Colonic inflammatory changes extend to the anterior aspect of the uterus. Adnexa  are poorly defined. Other: Other than the pericolonic collection there is no pneumoperitoneum. No significant free fluid. Generalized edema in the body wall which appears to be confluent in the lower abdominal pannus. Musculoskeletal: L5-S1 degenerative disc disease and facet hypertrophy. There are no acute or suspicious osseous abnormalities. IMPRESSION: 1. Acute sigmoid diverticulitis with poorly defined extraluminal collection extending inferior to the inflamed colon abutting the dome of the bladder suspicious for abscess. This measures approximately 5.6 x 6.9 x 3.2 cm. 2. Colonic inflammatory changes extend to the dome of the urinary bladder with associated bladder wall thickening as well as spot anterior aspect of the uterus. 3. Hepatosplenomegaly. 4. Cholelithiasis. 5. Generalized edema in the body wall which appears to be confluent in the lower abdominal pannus. Electronically Signed   By: Narda Rutherford M.D.   On: 09/21/2023 21:25   DG Chest 1 View  Result Date: 09/21/2023 CLINICAL DATA:  Abdominal pain.  Fever.  Sepsis. EXAM: CHEST  1 VIEW COMPARISON:  06/03/2022. FINDINGS: Bilateral lung fields are clear. Elevated right hemidiaphragm again seen. Bilateral costophrenic angles are clear. Normal cardio-mediastinal silhouette. No acute osseous abnormalities. The soft tissues are within normal limits. No free air under the domes of diaphragm. IMPRESSION: No active disease. Electronically Signed   By: Jules Schick M.D.   On: 09/21/2023 18:20    Review of Systems  Constitutional:  Positive for appetite change and fever.  HENT: Negative.    Eyes: Negative.   Cardiovascular: Negative.   Gastrointestinal:  Positive for abdominal pain.  Endocrine: Negative.   Genitourinary: Negative.   Musculoskeletal: Negative.   Allergic/Immunologic: Negative.    Neurological: Negative.   Hematological: Negative.   Psychiatric/Behavioral: Negative.     Blood pressure 100/60, pulse 76, temperature (!) 100.5 F (38.1 C), temperature source Oral, resp. rate 20, height 5\' 1"  (1.549 m), weight (!) 187.8 kg, last menstrual period 09/08/2023, SpO2 94%. Physical Exam HENT:     Head: Normocephalic.  Cardiovascular:     Rate and Rhythm: Normal rate and regular rhythm.  Pulmonary:     Effort: Pulmonary effort is normal. No respiratory distress.     Breath sounds: Normal breath sounds.  Abdominal:     Palpations: Abdomen is soft.     Tenderness: There is abdominal tenderness in the left lower quadrant. There is no guarding or rebound.     Comments: Some left lower quadrant tenderness and suprapubic tenderness without peritonitis  Skin:    Capillary Refill: Capillary refill takes 2 to 3 seconds.  Neurological:     Mental Status: She is alert and oriented to person, place, and time.  Psychiatric:        Mood and Affect: Mood normal.     Assessment/Plan: Sigmoid diverticulitis with abscess -agree with medical admission, IV antibiotics and bowel rest.  No need for emergent surgery at this time.  I also recommend IR evaluation for possible drain placement.  I discussed the plan in detail with her and answered her questions. Mild AKI BMI 78  Liz Malady 09/21/2023, 10:47 PM

## 2023-09-21 NOTE — ED Notes (Addendum)
This RN is resuming care at this time, report from RN Ahmed Prima (who has not been to see pt per Marcelino Duster), pt is a sepsis work-up IV ABX order at 1775, unknown if Tylenol has been administer as pt came in with a fever) pt does not have IV, ABX has not been started. This RN spoke with Dr. Jacqulyn Bath regarding delay in patient ABX and care, MD aware this RN is just taking over care and understands circumstance, this RN to start IV and start ABX.

## 2023-09-21 NOTE — ED Notes (Signed)
ED TO INPATIENT HANDOFF REPORT  ED Nurse Name and Phone #:  Minerva Areola [phone]  S Name/Age/Gender Alexandra Beck 42 y.o. female Room/Bed: 046C/046C  Code Status   Code Status: Prior  Home/SNF/Other Home Patient oriented to: self, place, time, and situation Is this baseline? Yes   Triage Complete: Triage complete  Chief Complaint Diverticulitis of large intestine with abscess [K57.20]  Triage Note Pt with abd pain N&v. Pt had CT and showed diverticulitis and possible perforation. Pt has had temp of 102.5.   Allergies Allergies  Allergen Reactions   Aspirin Hives and Swelling    REACTION: throat swelling, hives Other reaction(s): Unknown Other reaction(s): Unknown   Doxycycline Other (See Comments)    Abdominal pain, difficulty swallowing   Lisinopril Cough   Niacin And Related     Other reaction(s): Unknown Other reaction(s): Unknown   Niaspan [Niacin Er (Antihyperlipidemic)]     Caused flushing    Level of Care/Admitting Diagnosis ED Disposition     ED Disposition  Admit   Condition  --   Comment  Hospital Area: [facility] [[id number]]  Level of Care: Med-Surg [16]  May admit patient to [facility] or [facility] if equivalent level of care is available:: No  Covid Evaluation: Asymptomatic - no recent exposure (last 10 days) testing not required  Diagnosis: Diverticulitis of large intestine with abscess [8295621]  Admitting Physician: Charlsie Quest [[id number]]  Attending Physician: Charlsie Quest [[id number]]  Certification:: I certify this patient will need inpatient services for at least 2 midnights  Expected Medical Readiness: 09/24/2023          B Medical/Surgery History Past Medical History:  Diagnosis Date   Acute renal failure (ARF) (HCC) 11/2012    multifactorial-likely secondary to ATN in the setting of sepsis and hypotension, and also from rhabdomyolysis   Anemia    Cellulitis    Chronic diastolic CHF (congestive heart failure)  (HCC)    Depression    Diabetes mellitus (HCC)    GERD (gastroesophageal reflux disease)    Hyperlipidemia    Hypertension    Hypothyroidism    Morbid obesity with BMI of 70 and over, adult (HCC)    Obesity hypoventilation syndrome (HCC)    OSA (obstructive sleep apnea)    PVC's (premature ventricular contractions)    Recurrent cellulitis of lower leg    LLE, venous insuff   Sepsis (HCC) 11/2012   Secondary to cellulitis   Sleep apnea    Venous insufficiency of leg    Past Surgical History:  Procedure Laterality Date   IR FLUORO GUIDE CV MIDLINE PICC RIGHT  03/28/2017   IR US GUIDE VASC ACCESS RIGHT  03/28/2017   WISDOM TOOTH EXTRACTION       A IV Location/Drains/Wounds Patient Lines/Drains/Airways Status     Active Line/Drains/Airways     Name Placement date Placement time Site Days   Peripheral IV 09/21/23 20 G Anterior;Right Forearm 09/21/23  1948  Forearm  less than 1   Peripheral IV 09/21/23 20 G Left;Posterior;Proximal Forearm 09/21/23  1953  Forearm  less than 1   Wound 12/27/12 Other (Comment) Leg Left swelling redness to left lower leg-cellulitis dx 12/27/12  0938  Leg  3920   Wound 12/30/12 Other (Comment) Leg Right 12/30/12  1936  Leg  3917   Wound / Incision (Open or Dehisced) 05/13/22 Other (Comment) Foot Anterior;Left 05/13/22  2300  Foot  496   Wound / Incision (Open or Dehisced) 05/13/22 Other (  Comment) Foot Anterior;Right 05/13/22  2300  Foot  496            Intake/Output Last 24 hours  Intake/Output Summary (Last 24 hours) at 09/21/2023 2344 Last data filed at 09/21/2023 2032 Gross per 24 hour  Intake 100 ml  Output --  Net 100 ml    Labs/Imaging Results for orders placed or performed during the hospital encounter of 09/21/23 (from the past 48 hour(s))  Comprehensive metabolic panel     Status: Abnormal   Collection Time: 09/21/23  5:04 PM  Result Value Ref Range   Sodium 137 135 - 145 mmol/L   Potassium 3.9 3.5 - 5.1 mmol/L   Chloride 98 98  - 111 mmol/L   CO2 24 22 - 32 mmol/L   Glucose, Bld 94 70 - 99 mg/dL    Comment: Glucose reference range applies only to samples taken after fasting for at least 8 hours.   BUN 20 6 - 20 mg/dL   Creatinine, Ser 1.61 (H) 0.44 - 1.00 mg/dL   Calcium 9.1 8.9 - 09.6 mg/dL   Total Protein 7.4 6.5 - 8.1 g/dL   Albumin 3.5 3.5 - 5.0 g/dL   AST 19 15 - 41 U/L   ALT 15 0 - 44 U/L   Alkaline Phosphatase 74 38 - 126 U/L   Total Bilirubin 0.5 0.3 - 1.2 mg/dL   GFR, Estimated 56 (L) >60 mL/min    Comment: (NOTE) Calculated using the CKD-EPI Creatinine Equation (2021)    Anion gap 15 5 - 15    Comment: Performed at [facility], [address] [place], [place] [address]  CBC with Differential     Status: Abnormal   Collection Time: 09/21/23  5:04 PM  Result Value Ref Range   WBC 9.4 4.0 - 10.5 K/uL   RBC 4.01 3.87 - 5.11 MIL/uL   Hemoglobin 10.7 (L) 12.0 - 15.0 g/dL   HCT 98.1 19.1 - 47.8 %   MCV 90.0 80.0 - 100.0 fL   MCH 26.7 26.0 - 34.0 pg   MCHC 29.6 (L) 30.0 - 36.0 g/dL   RDW 29.5 (H) 62.1 - 30.8 %   Platelets 277 150 - 400 K/uL   nRBC 0.0 0.0 - 0.2 %   Neutrophils Relative % 86 %   Neutro Abs 8.1 (H) 1.7 - 7.7 K/uL   Lymphocytes Relative 6 %   Lymphs Abs 0.6 (L) 0.7 - 4.0 K/uL   Monocytes Relative 6 %   Monocytes Absolute 0.5 0.1 - 1.0 K/uL   Eosinophils Relative 1 %   Eosinophils Absolute 0.1 0.0 - 0.5 K/uL   Basophils Relative 0 %   Basophils Absolute 0.0 0.0 - 0.1 K/uL   Immature Granulocytes 1 %   Abs Immature Granulocytes 0.06 0.00 - 0.07 K/uL    Comment: Performed at [facility], [address], [place], [place] [address]  Protime-INR     Status: None   Collection Time: 09/21/23  5:04 PM  Result Value Ref Range   Prothrombin Time 14.9 11.4 - 15.2 seconds   INR 1.2 0.8 - 1.2    Comment: (NOTE) INR goal varies based on device and disease states. Performed at [facility], [address], [place], [place] [address]   APTT     Status: None    Collection Time: 09/21/23  5:04 PM  Result Value Ref Range   aPTT 30 24 - 36 seconds    Comment: Performed  at [facility], [address], [place], [place] [address]  hCG, serum, qualitative     Status: None   Collection Time: 09/21/23  5:04 PM  Result Value Ref Range   Preg, Serum NEGATIVE NEGATIVE    Comment:        THE SENSITIVITY OF THIS METHODOLOGY IS >10 mIU/mL. Performed at [facility], [address], [place], [place] [address]   I-Stat Lactic Acid, ED     Status: None   Collection Time: 09/21/23  5:19 PM  Result Value Ref Range   Lactic Acid, Venous 1.4 0.5 - 1.9 mmol/L  Resp panel by RT-PCR (RSV, Flu A&B, Covid) Anterior Nasal Swab     Status: None   Collection Time: 09/21/23  7:59 PM   Specimen: Anterior Nasal Swab  Result Value Ref Range   SARS Coronavirus 2 by RT PCR NEGATIVE NEGATIVE   Influenza A by PCR NEGATIVE NEGATIVE   Influenza B by PCR NEGATIVE NEGATIVE    Comment: (NOTE) The Xpert Xpress SARS-CoV-2/FLU/RSV plus assay is intended as an aid in the diagnosis of influenza from Nasopharyngeal swab specimens and should not be used as a sole basis for treatment. Nasal washings and aspirates are unacceptable for Xpert Xpress SARS-CoV-2/FLU/RSV testing.  Fact Sheet for Patients: BloggerCourse.com  Fact Sheet for Healthcare Providers: SeriousBroker.it  This test is not yet approved or cleared by the [place] FDA and has been authorized for detection and/or diagnosis of SARS-CoV-2 by FDA under an Emergency Use Authorization (EUA). This EUA will remain in effect (meaning this test can be used) for the duration of the COVID-19 declaration under Section 564(b)(1) of the Act, 21 U.S.C. section 360bbb-3(b)(1), unless the authorization is terminated or revoked.     Resp Syncytial Virus by PCR NEGATIVE NEGATIVE    Comment: (NOTE) Fact Sheet for  Patients: BloggerCourse.com  Fact Sheet for Healthcare Providers: SeriousBroker.it  This test is not yet approved or cleared by the [place] FDA and has been authorized for detection and/or diagnosis of SARS-CoV-2 by FDA under an Emergency Use Authorization (EUA). This EUA will remain in effect (meaning this test can be used) for the duration of the COVID-19 declaration under Section 564(b)(1) of the Act, 21 U.S.C. section 360bbb-3(b)(1), unless the authorization is terminated or revoked.  Performed at [facility], [address] [place], [place] [address]    CT ABDOMEN PELVIS W CONTRAST  Result Date: 09/21/2023 CLINICAL DATA:  Left lower quadrant pain.  Diverticulitis. EXAM: CT ABDOMEN AND PELVIS WITH CONTRAST TECHNIQUE: Multidetector CT imaging of the abdomen and pelvis was performed using the standard protocol following bolus administration of intravenous contrast. RADIATION DOSE REDUCTION: This exam was performed according to the departmental dose-optimization program which includes automated exposure control, adjustment of the mA and/or kV according to patient size and/or use of iterative reconstruction technique. CONTRAST:  75mL OMNIPAQUE IOHEXOL 350 MG/ML SOLN COMPARISON:  CT 03/19/2023 FINDINGS: Lower chest: The heart is borderline enlarged. No basilar airspace disease or pleural effusion. Hepatobiliary: Enlarged liver spanning 21.2 cm cranial caudal. No focal liver abnormality. Calcified gallstone without pericholecystic inflammation. No biliary dilatation. Pancreas: No ductal dilatation or inflammation. Spleen: Enlarged, 15.1 cm AP. Adrenals/Urinary Tract: No adrenal nodule. Lobulated bilateral renal contours. No hydronephrosis. No renal calculi. Moderate wall thickening about the dome of the bladder, but no definite intravesicular air. Stomach/Bowel: Moderate length segment of colonic wall thickening in the proximal sigmoid  in the region of multiple diverticula, likely representing  diverticulitis. There is a poorly defined extraluminal collection extending inferior to the inflamed colon abutting the dome of the bladder suspicious for abscess. This measures approximately 5.6 x 6.9 x 3.2 cm, series 3, image 72 and sagittal series 7, image 100. Collection is poorly defined with internal air and predominantly soft tissue thickening, small amount of intraluminal fluid. Ill-defined soft tissue tract of sent from the colon. Collection abuts the dome of the bladder with associated wall thickening. Multiple additional noninflamed colonic diverticula. No small bowel obstruction or inflammation. Vascular/Lymphatic: Normal caliber abdominal aorta. No evidence of portal or mesenteric thrombus. There prominent bilateral inguinal and external iliac nodes are likely reactive. Reproductive: Colonic inflammatory changes extend to the anterior aspect of the uterus. Adnexa are poorly defined. Other: Other than the pericolonic collection there is no pneumoperitoneum. No significant free fluid. Generalized edema in the body wall which appears to be confluent in the lower abdominal pannus. Musculoskeletal: L5-S1 degenerative disc disease and facet hypertrophy. There are no acute or suspicious osseous abnormalities. IMPRESSION: 1. Acute sigmoid diverticulitis with poorly defined extraluminal collection extending inferior to the inflamed colon abutting the dome of the bladder suspicious for abscess. This measures approximately 5.6 x 6.9 x 3.2 cm. 2. Colonic inflammatory changes extend to the dome of the urinary bladder with associated bladder wall thickening as well as spot anterior aspect of the uterus. 3. Hepatosplenomegaly. 4. Cholelithiasis. 5. Generalized edema in the body wall which appears to be confluent in the lower abdominal pannus. Electronically Signed   By: Narda Rutherford M.D.   On: 09/21/2023 21:25   DG Chest 1 View  Result Date:  09/21/2023 CLINICAL DATA:  Abdominal pain.  Fever.  Sepsis. EXAM: CHEST  1 VIEW COMPARISON:  06/03/2022. FINDINGS: Bilateral lung fields are clear. Elevated right hemidiaphragm again seen. Bilateral costophrenic angles are clear. Normal cardio-mediastinal silhouette. No acute osseous abnormalities. The soft tissues are within normal limits. No free air under the domes of diaphragm. IMPRESSION: No active disease. Electronically Signed   By: Jules Schick M.D.   On: 09/21/2023 18:20    Pending Labs Unresulted Labs (From admission, onward)     Start     Ordered   09/21/23 1701  Urinalysis, w/ Reflex to Culture (Infection Suspected) -Urine, Clean Catch  (Septic presentation on arrival (screening labs, nursing and treatment orders for obvious sepsis))  ONCE - URGENT,   URGENT       Question:  Specimen Source  Answer:  Urine, Clean Catch   09/21/23 1701   09/21/23 1700  Blood Culture (routine x 2)  (Septic presentation on arrival (screening labs, nursing and treatment orders for obvious sepsis))  BLOOD CULTURE X 2,   STAT      09/21/23 1701            Vitals/Pain Today's Vitals   09/21/23 2130 09/21/23 2200 09/21/23 2227 09/21/23 2245  BP: 107/63 (!) 107/47  99/61  Pulse: 73 73  74  Resp:    19  Temp:    99.5 F (37.5 C)  TempSrc:    Oral  SpO2: 92% 95%  94%  Weight:      Height:      PainSc:   7  7     Isolation Precautions No active isolations  Medications Medications  morphine (PF) 2 MG/ML injection 2 mg (has no administration in time range)  ondansetron (ZOFRAN) injection 4 mg (has no administration in time range)  piperacillin-tazobactam (ZOSYN) IVPB 3.375 g (has no administration in  time range)  cefTRIAXone (ROCEPHIN) 2 g in sodium chloride 0.9 % 100 mL IVPB (0 g Intravenous Stopped 09/21/23 2032)  metroNIDAZOLE (FLAGYL) IVPB 500 mg (0 mg Intravenous Stopped 09/21/23 2212)  acetaminophen (TYLENOL) tablet 1,000 mg (1,000 mg Oral Given 09/21/23 1946)  morphine (PF) 4 MG/ML  injection 4 mg (4 mg Intravenous Given 09/21/23 2002)  ondansetron (ZOFRAN) injection 4 mg (4 mg Intravenous Given 09/21/23 2000)  iohexol (OMNIPAQUE) 350 MG/ML injection 75 mL (75 mLs Intravenous Contrast Given 09/21/23 2104)  morphine (PF) 4 MG/ML injection 4 mg (4 mg Intravenous Given 09/21/23 2247)    Mobility walks     Focused Assessments Cardiac Assessment Handoff:    Lab Results  Component Value Date   CKTOTAL 63 07/23/2014   CKMB 1.2 10/19/2009   TROPONINI <0.30 07/24/2014   Lab Results  Component Value Date   DDIMER 1.28 (H) 09/16/2021   Does the Patient currently have chest pain? No    R Recommendations: See Admitting Provider Note  Report given to:   Additional Notes:  Still having abd pain, morphine not really helping, 2 good lines, pt very pleasent

## 2023-09-21 NOTE — ED Provider Notes (Signed)
[facility] AT [facility] Provider Note   CSN: [id number] Arrival date & time: 09/21/23  1648     History  Chief Complaint  Patient presents with   Abdominal Pain    Alexandra Beck is a 42 y.o. female with past medical history of obesity, type 2 diabetes, hyperlipidemia, hypertension presents with diverticulitis seen on CT scan.  Patient states she has had 3 weeks of left lower quadrant abdominal pain that she initially attributed to gas, however it persisted and worsened so she presented to her PCP several days ago.  She had an outpatient CT scan without contrast that showed diverticulitis with a possible abscess and a possible fistula between the abscess and the bladder.  She was started on oral Augmentin 2 days ago with plan to obtain outpatient CT scan with IV contrast [place].  Patient however developed nausea and fevers yesterday up to 101 and generalized abdominal pain that brought her to tears.  She was advised by her PCP to come to the ED today.  Denies vomiting.  Currently does not have blood in stool but states a few days ago she did, in the past had intermittent painless bright red blood per rectum but was never evaluated.  Denies any urethral gas or stool urinary discoloration, but does state that urinating it feels abnormal as if air is trying to enter the urinary tract through the urethra.    The history is provided by the patient and medical records.       Home Medications Prior to Admission medications   Medication Sig Start Date End Date Taking? Authorizing Provider  amoxicillin-clavulanate (AUGMENTIN) 875-125 MG tablet Take 1 tablet by mouth 2 (two) times daily. Patient taking differently: Take 1 tablet by mouth 2 (two) times daily. Patient is 3 days into course 09/18/23  Yes Myrlene Broker, MD  lamoTRIgine (LAMICTAL) 150 MG tablet Take 1 tablet (150 mg total) by mouth daily. Patient taking differently: Take 150 mg by mouth at  bedtime. 09/05/23  Yes Mozingo, Thereasa Solo, NP  levothyroxine (SYNTHROID) 150 MCG tablet Take 1 tablet (150 mcg total) by mouth daily before breakfast. Must keep scheduled appt for future refills 06/30/23  Yes Corwin Levins, MD  losartan (COZAAR) 100 MG tablet Take 1 tablet (100 mg total) by mouth daily. 07/29/22  Yes Corky Crafts, MD  metFORMIN (GLUCOPHAGE) 500 MG tablet Take 1 tablet (500 mg total) by mouth daily. Must keep scheduled appt for future refills 06/30/23  Yes Corwin Levins, MD  metoprolol succinate (TOPROL-XL) 50 MG 24 hr tablet Take 1 tablet (50 mg total) by mouth daily. Must keep scheduled appt for future refills 06/30/23  Yes Corwin Levins, MD  modafinil (PROVIGIL) 100 MG tablet Take 1 tablet (100mg  total) by mouth daily at 9am (along with 200 mg tablet daily at 9am for a total of 300 mg daily) Patient taking differently: Take 100 mg by mouth daily. Take daily at 1230 with a 200mg  tablet. 06/09/23  Yes Oretha Milch, MD  modafinil (PROVIGIL) 100 MG tablet Take 100 mg by mouth daily. Take with the 200mg  tablet for a dose of 300mg  total.   Yes [provider]  modafinil (PROVIGIL) 200 MG tablet Take 1 tablet by mouth daily at 9am (along with 100 mg tablet daily at 9am (total 300mg )) Patient taking differently: Take 200 mg by mouth daily. Take 1 tablet by mouth daily at 1230 with a 100mg  tablet 07/03/23 11/03/23 Yes Vassie Loll,  Comer Locket, MD  rosuvastatin (CRESTOR) 20 MG tablet Take 1 tablet (20 mg total) by mouth daily. 06/30/23  Yes Corwin Levins, MD  Semaglutide, 2 MG/DOSE, 8 MG/3ML SOPN Inject 2 mg into the skin as directed once a week. Patient taking differently: Inject 2 mg as directed once a week. Take on Fridays at 1230 09/18/23  Yes Rayburn, Fanny Bien, PA-C  sertraline (ZOLOFT) 100 MG tablet Take 2 tablets (200 mg total) by mouth daily. 12/16/22  Yes Mozingo, Thereasa Solo, NP  spironolactone (ALDACTONE) 25 MG tablet Take 1 tablet (25 mg total) by mouth daily. 06/30/23   Yes Corwin Levins, MD  torsemide (DEMADEX) 20 MG tablet Take 4 tablets (80 mg total) by mouth 2 (two) times daily as needed. Patient taking differently: Take 120 mg by mouth daily. 07/29/22  Yes Corky Crafts, MD  traMADol (ULTRAM) 50 MG tablet Take 1 tablet (50 mg total) by mouth every 8 (eight) hours as needed. 04/12/23  Yes Corwin Levins, MD  triamcinolone cream (KENALOG) 0.1 % Apply 1 Application topically 2 (two) times daily. Patient taking differently: Apply 1 Application topically daily as needed (Apply to hands). 04/12/23 04/11/24 Yes Corwin Levins, MD  ibuprofen (ADVIL) 200 MG tablet Take 800 mg by mouth daily as needed for headache or moderate pain (pain score 4-6).    [provider]  Vitamin D, Ergocalciferol, (DRISDOL) 1.25 MG (50000 UNIT) CAPS capsule Take 1 capsule (50,000 Units total) by mouth every 7 (seven) days. Patient taking differently: Take 50,000 Units by mouth once a week. Take on Sundays at 1230 09/18/23   Rayburn, Fanny Bien, PA-C      Allergies    Aspirin, Doxycycline, Lisinopril, Niacin and related, and Niaspan [niacin er (antihyperlipidemic)]    Review of Systems   Review of Systems as per HPI  Physical Exam Updated Vital Signs BP 99/61 (BP Location: Right Arm)   Pulse 74   Temp 99.5 F (37.5 C) (Oral)   Resp 19   Ht 5\' 1"  (1.549 m)   Wt (!) 187.8 kg   LMP 09/08/2023   SpO2 94%   BMI 78.23 kg/m  Physical Exam Constitutional:      General: She is not in acute distress.    Appearance: She is obese. She is not ill-appearing.  HENT:     Head: Normocephalic and atraumatic.     Mouth/Throat:     Mouth: Mucous membranes are moist.  Eyes:     Extraocular Movements: Extraocular movements intact.     Pupils: Pupils are equal, round, and reactive to light.  Cardiovascular:     Rate and Rhythm: Normal rate and regular rhythm.     Heart sounds: Normal heart sounds. No murmur heard.    No friction rub. No gallop.  Pulmonary:     Effort:  Pulmonary effort is normal. No respiratory distress.     Breath sounds: Normal breath sounds. No stridor. No wheezing, rhonchi or rales.  Abdominal:     General: There is no distension.     Palpations: Abdomen is soft.     Tenderness: There is generalized abdominal tenderness. There is no guarding or rebound.  Skin:    General: Skin is warm and dry.     Capillary Refill: Capillary refill takes less than 2 seconds.  Neurological:     General: No focal deficit present.     Mental Status: She is alert.     ED Results / Procedures / Treatments  Labs (all labs ordered are listed, but only abnormal results are displayed) Labs Reviewed  COMPREHENSIVE METABOLIC PANEL - Abnormal; Notable for the following components:      Result Value   Creatinine, Ser 1.22 (*)    GFR, Estimated 56 (*)    All other components within normal limits  CBC WITH DIFFERENTIAL/PLATELET - Abnormal; Notable for the following components:   Hemoglobin 10.7 (*)    MCHC 29.6 (*)    RDW 15.6 (*)    Neutro Abs 8.1 (*)    Lymphs Abs 0.6 (*)    All other components within normal limits  RESP PANEL BY RT-PCR (RSV, FLU A&B, COVID)  RVPGX2  CULTURE, BLOOD (ROUTINE X 2)  CULTURE, BLOOD (ROUTINE X 2)  PROTIME-INR  APTT  HCG, SERUM, QUALITATIVE  URINALYSIS, W/ REFLEX TO CULTURE (INFECTION SUSPECTED)  I-STAT CG4 LACTIC ACID, ED  I-STAT CG4 LACTIC ACID, ED    EKG None  Radiology CT ABDOMEN PELVIS W CONTRAST  Result Date: 09/21/2023 CLINICAL DATA:  Left lower quadrant pain.  Diverticulitis. EXAM: CT ABDOMEN AND PELVIS WITH CONTRAST TECHNIQUE: Multidetector CT imaging of the abdomen and pelvis was performed using the standard protocol following bolus administration of intravenous contrast. RADIATION DOSE REDUCTION: This exam was performed according to the departmental dose-optimization program which includes automated exposure control, adjustment of the mA and/or kV according to patient size and/or use of iterative  reconstruction technique. CONTRAST:  75mL OMNIPAQUE IOHEXOL 350 MG/ML SOLN COMPARISON:  CT 03/19/2023 FINDINGS: Lower chest: The heart is borderline enlarged. No basilar airspace disease or pleural effusion. Hepatobiliary: Enlarged liver spanning 21.2 cm cranial caudal. No focal liver abnormality. Calcified gallstone without pericholecystic inflammation. No biliary dilatation. Pancreas: No ductal dilatation or inflammation. Spleen: Enlarged, 15.1 cm AP. Adrenals/Urinary Tract: No adrenal nodule. Lobulated bilateral renal contours. No hydronephrosis. No renal calculi. Moderate wall thickening about the dome of the bladder, but no definite intravesicular air. Stomach/Bowel: Moderate length segment of colonic wall thickening in the proximal sigmoid in the region of multiple diverticula, likely representing diverticulitis. There is a poorly defined extraluminal collection extending inferior to the inflamed colon abutting the dome of the bladder suspicious for abscess. This measures approximately 5.6 x 6.9 x 3.2 cm, series 3, image 72 and sagittal series 7, image 100. Collection is poorly defined with internal air and predominantly soft tissue thickening, small amount of intraluminal fluid. Ill-defined soft tissue tract of sent from the colon. Collection abuts the dome of the bladder with associated wall thickening. Multiple additional noninflamed colonic diverticula. No small bowel obstruction or inflammation. Vascular/Lymphatic: Normal caliber abdominal aorta. No evidence of portal or mesenteric thrombus. There prominent bilateral inguinal and external iliac nodes are likely reactive. Reproductive: Colonic inflammatory changes extend to the anterior aspect of the uterus. Adnexa are poorly defined. Other: Other than the pericolonic collection there is no pneumoperitoneum. No significant free fluid. Generalized edema in the body wall which appears to be confluent in the lower abdominal pannus. Musculoskeletal: L5-S1  degenerative disc disease and facet hypertrophy. There are no acute or suspicious osseous abnormalities. IMPRESSION: 1. Acute sigmoid diverticulitis with poorly defined extraluminal collection extending inferior to the inflamed colon abutting the dome of the bladder suspicious for abscess. This measures approximately 5.6 x 6.9 x 3.2 cm. 2. Colonic inflammatory changes extend to the dome of the urinary bladder with associated bladder wall thickening as well as spot anterior aspect of the uterus. 3. Hepatosplenomegaly. 4. Cholelithiasis. 5. Generalized edema in the body wall which appears to  be confluent in the lower abdominal pannus. Electronically Signed   By: Narda Rutherford M.D.   On: 09/21/2023 21:25   DG Chest 1 View  Result Date: 09/21/2023 CLINICAL DATA:  Abdominal pain.  Fever.  Sepsis. EXAM: CHEST  1 VIEW COMPARISON:  06/03/2022. FINDINGS: Bilateral lung fields are clear. Elevated right hemidiaphragm again seen. Bilateral costophrenic angles are clear. Normal cardio-mediastinal silhouette. No acute osseous abnormalities. The soft tissues are within normal limits. No free air under the domes of diaphragm. IMPRESSION: No active disease. Electronically Signed   By: Jules Schick M.D.   On: 09/21/2023 18:20    Procedures Procedures    Medications Ordered in ED Medications  morphine (PF) 2 MG/ML injection 2 mg (has no administration in time range)  ondansetron (ZOFRAN) injection 4 mg (has no administration in time range)  piperacillin-tazobactam (ZOSYN) IVPB 3.375 g (has no administration in time range)  cefTRIAXone (ROCEPHIN) 2 g in sodium chloride 0.9 % 100 mL IVPB (0 g Intravenous Stopped 09/21/23 2032)  metroNIDAZOLE (FLAGYL) IVPB 500 mg (0 mg Intravenous Stopped 09/21/23 2212)  acetaminophen (TYLENOL) tablet 1,000 mg (1,000 mg Oral Given 09/21/23 1946)  morphine (PF) 4 MG/ML injection 4 mg (4 mg Intravenous Given 09/21/23 2002)  ondansetron (ZOFRAN) injection 4 mg (4 mg Intravenous  Given 09/21/23 2000)  iohexol (OMNIPAQUE) 350 MG/ML injection 75 mL (75 mLs Intravenous Contrast Given 09/21/23 2104)  morphine (PF) 4 MG/ML injection 4 mg (4 mg Intravenous Given 09/21/23 2247)    ED Course/ Medical Decision Making/ A&P                                 Medical Decision Making Amount and/or Complexity of Data Reviewed External Data Reviewed: radiology and notes. Labs: ordered. Decision-making details documented in ED Course. Radiology: ordered and independent interpretation performed. Decision-making details documented in ED Course.  Risk Prescription drug management. Parenteral controlled substances. Decision regarding hospitalization.   42 year old female with known diverticulitis with possible abscess on CT scan 3 days ago presenting with worsening pain that is now generalized, new fevers despite 2 days of Augmentin.  Febrile to 102.5 on arrival but otherwise does not meet sepsis criteria.  Tylenol given to control fever.  Considered includes diverticulitis abscess, perforation, peritonitis, multifocal abscesses, bladder fistula, UTI, other illness to cause fever such as pneumonia.  Her abdominal exam is concerning for peritonitis or perforation given that the tenderness is generalized, and while examining her she states that she has severe pain while coughing all over her abdomen.   Labs obtained and notable for no leukocytosis 9.4 though left shifted, hemoglobin 10.7 stable from prior, Cre 1.22 up from prior of 1.04 though not meeting criteria for AKI, normal lactate, normal lytes and glucose, neg preganncy, normal coags. Blood cultures obtained.   CXR obtained as part of fever workup; shows no abnormality on my interpretation. Additionally Covid/Flu nasal swab sent and shows neg result. UA was ordered but pending sample collection  I personally reviewed and interpreted CT from 10/21, shows diverticulitis with possible air fluid level abutting bladder. Also showed  bladder wall thickening and could not rule out early fistulous collection between diverticulitis and bladder.  A CT scan with IV contrast was obtained today and shows acute sigmoid diverticulitis with an extraluminal collection 5.6 x 6.9 x 3.2 cm; no prior size documented.    Ceftriaxone, Flagyl given. Morphine, Zofran given for pain, nausea control.  I consulted  medicine for admission for IV antibiotics, possible IR consultation versus surgery consultation in the morning.  Requested surgery evaluate patient in ED prior to admission, surgery was consulted.  Patient was then accepted to hospitalist floor for further and definitive care and management.           Final Clinical Impression(s) / ED Diagnoses Final diagnoses:  Generalized abdominal pain  Diverticulitis    Rx / DC Orders ED Discharge Orders     None         Karmen Stabs, MD 09/21/23 [id number]    Maia Plan, MD 09/26/23 [id number]

## 2023-09-21 NOTE — Sepsis Progress Note (Signed)
Elink monitoring for the code sepsis protocol.  

## 2023-09-21 NOTE — ED Triage Notes (Signed)
Pt with abd pain N&v. Pt had CT and showed diverticulitis and possible perforation. Pt has had temp of 102.5.

## 2023-09-21 NOTE — H&P (Signed)
History and Physical    [name] ZOX:[id number] DOB: 1981-09-26 DOA: 09/21/2023  PCP: Corwin Levins, MD  Patient coming from: Home  I have personally briefly reviewed patient's old medical records in [facility]  Chief Complaint: LLQ abdominal pain  HPI: Alexandra Beck is a 42 y.o. female wit chronic HFpEF, h medical history significant for T2DM, HTN, HLD, hypothyroidism, OSA/OHS on CPAP who presented to the ED for evaluation of LLQ abdominal pain in setting of recently diagnosed diverticulitis.  Patient was seen by her PCP on 09/15/2023 for evaluation of 3-4 weeks of left lower quadrant abdominal pain.  Noncontrast CT A/P was obtained and completed on 10/21 which showed diverticulitis with possible air-containing fluid collection abutting the bladder.  Follow-up contrast-enhanced CT A/P was ordered and scheduled.  Patient was started on Augmentin in the interim.  Patient states that she had worsening nausea and likely for admission developed fevers at home.  Pain had been worsening therefore she came to the ED for further evaluation.  ED Course  Labs/Imaging on admission: I have personally reviewed following labs and imaging studies.  Initial vitals showed BP 162/71, pulse 85, RR 26, temp 102.5 F, SpO2 98% on room air.  Labs show WBC 9.4, hemoglobin 10.7, platelets 277,000, sodium 137, potassium 3.9, bicarb 24, BUN 20, creatinine 1.22, serum glucose 94, LFTs within normal limits, lactic acid 1.4.  Serum hCG negative.  SARS-CoV-2, influenza, RSV PCR negative.  Blood cultures in process.  UA pending collection.  CT abdomen/pelvis with contrast showed acute sigmoid diverticulitis with poorly defined extraluminal collection extending inferior to the inflamed colon abutting the dome of the bladder suspicious for abscess.  This measures approximately 5.6 x 6.9 x 3.2 cm.  Colonic inflammatory changes extend to the dome of the urinary bladder with associated bladder wall thickening as  well as spot anterior aspect of the uterus.  Hepatosplenomegaly, cholelithiasis, generalized edema in the body wall also noted.  Patient was given IV morphine, Zofran, ceftriaxone and Flagyl.  General surgery consulted and recommended medical admission.  The hospitalist service was consulted to admit for further evaluation and management.  Review of Systems: All systems reviewed and are negative except as documented in history of present illness above.   Past Medical History:  Diagnosis Date   Acute renal failure (ARF) (HCC) 11/2012    multifactorial-likely secondary to ATN in the setting of sepsis and hypotension, and also from rhabdomyolysis   Anemia    Cellulitis    Chronic diastolic CHF (congestive heart failure) (HCC)    Depression    Diabetes mellitus (HCC)    GERD (gastroesophageal reflux disease)    Hyperlipidemia    Hypertension    Hypothyroidism    Morbid obesity with BMI of 70 and over, adult (HCC)    Obesity hypoventilation syndrome (HCC)    OSA (obstructive sleep apnea)    PVC's (premature ventricular contractions)    Recurrent cellulitis of lower leg    LLE, venous insuff   Sepsis (HCC) 11/2012   Secondary to cellulitis   Sleep apnea    Venous insufficiency of leg     Past Surgical History:  Procedure Laterality Date   IR FLUORO GUIDE CV MIDLINE PICC RIGHT  03/28/2017   IR US GUIDE VASC ACCESS RIGHT  03/28/2017   WISDOM TOOTH EXTRACTION      Social History:  reports that she quit smoking about 13 years ago. Her smoking use included cigarettes. She started smoking about 21 years ago.  She has a 4 pack-year smoking history. She has never used smokeless tobacco. She reports that she does not drink alcohol and does not use drugs.  Allergies  Allergen Reactions   Aspirin Hives and Swelling    REACTION: throat swelling, hives Other reaction(s): Unknown Other reaction(s): Unknown   Doxycycline Other (See Comments)    Abdominal pain, difficulty swallowing    Lisinopril Cough   Niacin And Related     Other reaction(s): Unknown Other reaction(s): Unknown   Niaspan [Niacin Er (Antihyperlipidemic)]     Caused flushing   Wound Dressing Adhesive Itching    Welts and burning of the skin    Family History  Problem Relation Age of Onset   Hypertension Mother    Diabetes Mother    High Cholesterol Mother    Thyroid disease Mother    Sleep apnea Mother    Obesity Mother    Hypertension Father    Heart disease Father        before age 71   High Cholesterol Father    Sleep apnea Father    Obesity Father      Prior to Admission medications   Medication Sig Start Date End Date Taking? Authorizing Provider  amoxicillin-clavulanate (AUGMENTIN) 875-125 MG tablet Take 1 tablet by mouth 2 (two) times daily. 09/18/23  Yes Myrlene Broker, MD  lamoTRIgine (LAMICTAL) 150 MG tablet Take 1 tablet (150 mg total) by mouth daily. Patient taking differently: Take 150 mg by mouth at bedtime. 09/05/23  Yes Mozingo, Thereasa Solo, NP  levothyroxine (SYNTHROID) 150 MCG tablet Take 1 tablet (150 mcg total) by mouth daily before breakfast. Must keep scheduled appt for future refills 06/30/23  Yes Corwin Levins, MD  losartan (COZAAR) 100 MG tablet Take 1 tablet (100 mg total) by mouth daily. 07/29/22  Yes Corky Crafts, MD  metFORMIN (GLUCOPHAGE) 500 MG tablet Take 1 tablet (500 mg total) by mouth daily. Must keep scheduled appt for future refills 06/30/23  Yes Corwin Levins, MD  metoprolol succinate (TOPROL-XL) 50 MG 24 hr tablet Take 1 tablet (50 mg total) by mouth daily. Must keep scheduled appt for future refills 06/30/23  Yes Corwin Levins, MD  modafinil (PROVIGIL) 100 MG tablet Take 1 tablet (100mg  total) by mouth daily at 9am (along with 200 mg tablet daily at 9am for a total of 300 mg daily) Patient taking differently: Take 100 mg by mouth daily. Take daily at 1230 with a 200mg  tablet. 06/09/23  Yes Oretha Milch, MD  modafinil (PROVIGIL) 100 MG tablet  Take 100 mg by mouth daily. Take with the 200mg  tablet for a dose of 300mg  total.   Yes [provider]  modafinil (PROVIGIL) 200 MG tablet Take 1 tablet by mouth daily at 9am (along with 100 mg tablet daily at 9am (total 300mg )) Patient taking differently: Take 200 mg by mouth daily. Take 1 tablet by mouth daily at 1230 with a 100mg  tablet 07/03/23 11/03/23 Yes Oretha Milch, MD  rosuvastatin (CRESTOR) 20 MG tablet Take 1 tablet (20 mg total) by mouth daily. 06/30/23  Yes Corwin Levins, MD  Semaglutide, 2 MG/DOSE, 8 MG/3ML SOPN Inject 2 mg into the skin as directed once a week. Patient taking differently: Inject 2 mg as directed once a week. Take on Fridays at 1230 09/18/23  Yes Rayburn, Fanny Bien, PA-C  sertraline (ZOLOFT) 100 MG tablet Take 2 tablets (200 mg total) by mouth daily. 12/16/22  Yes Mozingo, Thereasa Solo, NP  spironolactone (ALDACTONE) 25 MG tablet Take 1 tablet (25 mg total) by mouth daily. 06/30/23  Yes Corwin Levins, MD  torsemide (DEMADEX) 20 MG tablet Take 4 tablets (80 mg total) by mouth 2 (two) times daily as needed. Patient taking differently: Take 120 mg by mouth daily. 07/29/22  Yes Corky Crafts, MD  traMADol (ULTRAM) 50 MG tablet Take 1 tablet (50 mg total) by mouth every 8 (eight) hours as needed. 04/12/23  Yes Corwin Levins, MD  triamcinolone cream (KENALOG) 0.1 % Apply 1 Application topically 2 (two) times daily. Patient taking differently: Apply 1 Application topically daily as needed (Apply to hands). 04/12/23 04/11/24 Yes Corwin Levins, MD  ibuprofen (ADVIL) 200 MG tablet Take 800 mg by mouth daily as needed for headache or moderate pain (pain score 4-6).    [provider]  Vitamin D, Ergocalciferol, (DRISDOL) 1.25 MG (50000 UNIT) CAPS capsule Take 1 capsule (50,000 Units total) by mouth every 7 (seven) days. Patient taking differently: Take 50,000 Units by mouth once a week. Take on Sundays at 1230 09/18/23   Darrol Poke, [place]     Physical Exam: Vitals:   09/21/23 2110 09/21/23 2130 09/21/23 2200 09/21/23 2245  BP:  107/63 (!) 107/47 99/61  Pulse:  73 73 74  Resp:    19  Temp: (!) 100.5 F (38.1 C)   99.5 F (37.5 C)  TempSrc: Oral   Oral  SpO2:  92% 95% 94%  Weight:      Height:       Constitutional: Elderly obese woman resting in bed.  NAD, calm Eyes: PERRL, lids and conjunctivae normal ENMT: Mucous membranes are moist. Posterior pharynx clear of any exudate or lesions.Normal dentition.  Neck: normal, supple, no masses. Respiratory: clear to auscultation bilaterally, no wheezing, no crackles. Normal respiratory effort. No accessory muscle use.  Cardiovascular: Regular rate and rhythm, no murmurs / rubs / gallops. No extremity edema. 2+ pedal pulses. Abdomen: Left-sided tenderness, no masses palpated.  Large pannus. Musculoskeletal: no clubbing / cyanosis. No joint deformity upper and lower extremities. Good ROM, no contractures. Normal muscle tone.  Skin: no rashes, lesions, ulcers. No induration Neurologic:Sensation intact. Strength equal bilaterally. Psychiatric: Alert and oriented x 3.  EKG: Personally reviewed. Sinus rhythm, rate 80, low voltage, no acute ischemic changes.  Not significantly changed compared to prior.  Assessment/Plan Principal Problem:   Diverticulitis of large intestine with abscess Active Problems:   Chronic diastolic CHF (congestive heart failure) (HCC)   Type 2 diabetes mellitus with obesity (HCC)   OSA (obstructive sleep apnea)   Hypertension associated with diabetes (HCC)   Hypothyroidism   Hyperlipidemia associated with type 2 diabetes mellitus (HCC)   Alexandra Beck is a 42 y.o. female wit chronic HFpEF, h medical history significant for T2DM, HTN, HLD, hypothyroidism, OSA/OHS on CPAP who is admitted with acute sigmoid diverticulitis with abscess.  Assessment and Plan: Acute sigmoid diverticulitis with abscess: Seen on CT.  Abscess measures approximately 5.6 x 6.9  x 3.2 cm abutting the dome of the bladder. -General Surgery following -Continue IV Zosyn -Bowel rest -Analgesics as needed -IR eval ordered for possible drain placement  Chronic HFpEF: EF 55-60% by TTE 05/14/2022.  Body habitus limits volume assessment however appears to be compensated on admission. -Losartan, Toprol-XL, spironolactone on hold due to borderline hypotension  Type 2 diabetes: Placed on SSI.  Hypertension: Antihypertensives on hold due to borderline low blood pressure.  Hyperlipidemia: Continue rosuvastatin.  Hypothyroidism: Continue Synthroid.  Mood disorder: Continue sertraline and Lamictal.  Continue Provigil for narcolepsy.  OSA/OHS: Continue CPAP nightly.   DVT prophylaxis: SCDs Start: 09/22/23 0017 Code Status: Full code Family Communication: Wife at bedside Disposition Plan: From home, dispo pending clinical progress Consults called: General Surgery Severity of Illness: The appropriate patient status for this patient is INPATIENT. Inpatient status is judged to be reasonable and necessary in order to provide the required intensity of service to ensure the patient's safety. The patient's presenting symptoms, physical exam findings, and initial radiographic and laboratory data in the context of their chronic comorbidities is felt to place them at high risk for further clinical deterioration. Furthermore, it is not anticipated that the patient will be medically stable for discharge from the hospital within 2 midnights of admission.   * I certify that at the point of admission it is my clinical judgment that the patient will require inpatient hospital care spanning beyond 2 midnights from the point of admission due to high intensity of service, high risk for further deterioration and high frequency of surveillance required.Darreld Mclean MD [facility]  If 7PM-7AM, please contact night-coverage [email or web address]  09/22/2023, 12:24 AM

## 2023-09-21 NOTE — ED Provider Triage Note (Signed)
Emergency Medicine Provider Triage Evaluation Note  Alexandra Beck , a 42 y.o. female  was evaluated in triage.  Pt complains of abd pain. Endorse LLQ abd pain ongoing x 1 week, now having fever, chills, nausea, vomiting, decreased in appetite and urinary discomfort.  Was seen by PCP and had CT abd/pelvis and was told she has diverticulitis.  Was given augmentin without improvement.  CT mentioned possible perforation  Review of Systems  Positive: As above Negative: As above  Physical Exam  BP (!) 162/71 (BP Location: Right Arm)   Pulse 85   Temp (!) 102.5 F (39.2 C)   Resp (!) 26   LMP 09/08/2023   SpO2 98%  Gen:   Awake, no distress   Resp:  Normal effort  MSK:   Moves extremities without difficulty  Other:  Morbidly obese  Medical Decision Making  Medically screening exam initiated at 5:01 PM.  Appropriate orders placed.  RETHEL WEHE was informed that the remainder of the evaluation will be completed by another provider, this initial triage assessment does not replace that evaluation, and the importance of remaining in the ED until their evaluation is complete.  I have activated code sepsis.  Did not start fluid resuscitation.  Request pt to be moved to next available room.   Fayrene Helper, PA-C 09/21/23 [phone]

## 2023-09-21 NOTE — Telephone Encounter (Signed)
Ok to let pt know  CT reviewed, and has ? Of air outside the colon near where the infection is, I think meaning the possibility of small perforation of the colon (small hole in the colon) or even a open connection to the bladder that is not normal  There are no relevant labs to review  If she is saying she really is not improving at this point in the treatment, and the CT suggests possible more compliated than usual acute diverticulitis, I would suggest she go to ED now for more urgent evaluation and tx.     thanks

## 2023-09-21 NOTE — Sepsis Progress Note (Signed)
Notified bedside nurse of need to administer antibiotics.  

## 2023-09-21 NOTE — Sepsis Progress Note (Signed)
Patient is still in lobby

## 2023-09-21 NOTE — Telephone Encounter (Signed)
Please advise for patient if can. Md is out of office until Friday

## 2023-09-22 ENCOUNTER — Encounter (HOSPITAL_COMMUNITY): Payer: Self-pay | Admitting: Internal Medicine

## 2023-09-22 ENCOUNTER — Other Ambulatory Visit: Payer: BC Managed Care – PPO

## 2023-09-22 DIAGNOSIS — K5720 Diverticulitis of large intestine with perforation and abscess without bleeding: Secondary | ICD-10-CM | POA: Diagnosis not present

## 2023-09-22 LAB — CBC
HCT: 29.4 % — ABNORMAL LOW (ref 36.0–46.0)
Hemoglobin: 9 g/dL — ABNORMAL LOW (ref 12.0–15.0)
MCH: 26.7 pg (ref 26.0–34.0)
MCHC: 30.6 g/dL (ref 30.0–36.0)
MCV: 87.2 fL (ref 80.0–100.0)
Platelets: 219 10*3/uL (ref 150–400)
RBC: 3.37 MIL/uL — ABNORMAL LOW (ref 3.87–5.11)
RDW: 15.7 % — ABNORMAL HIGH (ref 11.5–15.5)
WBC: 7.1 10*3/uL (ref 4.0–10.5)
nRBC: 0 % (ref 0.0–0.2)

## 2023-09-22 LAB — GLUCOSE, CAPILLARY
Glucose-Capillary: 104 mg/dL — ABNORMAL HIGH (ref 70–99)
Glucose-Capillary: 161 mg/dL — ABNORMAL HIGH (ref 70–99)
Glucose-Capillary: 90 mg/dL (ref 70–99)
Glucose-Capillary: 96 mg/dL (ref 70–99)
Glucose-Capillary: 97 mg/dL (ref 70–99)

## 2023-09-22 LAB — URINALYSIS, W/ REFLEX TO CULTURE (INFECTION SUSPECTED)
Bacteria, UA: NONE SEEN
Bilirubin Urine: NEGATIVE
Glucose, UA: NEGATIVE mg/dL
Hgb urine dipstick: NEGATIVE
Ketones, ur: NEGATIVE mg/dL
Leukocytes,Ua: NEGATIVE
Nitrite: NEGATIVE
Protein, ur: NEGATIVE mg/dL
Specific Gravity, Urine: 1.018 (ref 1.005–1.030)
pH: 6 (ref 5.0–8.0)

## 2023-09-22 LAB — BASIC METABOLIC PANEL
Anion gap: 13 (ref 5–15)
BUN: 19 mg/dL (ref 6–20)
CO2: 26 mmol/L (ref 22–32)
Calcium: 8.6 mg/dL — ABNORMAL LOW (ref 8.9–10.3)
Chloride: 99 mmol/L (ref 98–111)
Creatinine, Ser: 1.35 mg/dL — ABNORMAL HIGH (ref 0.44–1.00)
GFR, Estimated: 50 mL/min — ABNORMAL LOW (ref >60)
Glucose, Bld: 97 mg/dL (ref 70–99)
Potassium: 3.5 mmol/L (ref 3.5–5.1)
Sodium: 138 mmol/L (ref 135–145)

## 2023-09-22 LAB — PROTIME-INR
INR: 1.2 (ref 0.8–1.2)
Prothrombin Time: 15.1 s (ref 11.4–15.2)

## 2023-09-22 LAB — HIV ANTIBODY (ROUTINE TESTING W REFLEX): HIV Screen 4th Generation wRfx: NONREACTIVE

## 2023-09-22 LAB — HEMOGLOBIN A1C
Hgb A1c MFr Bld: 6 % — ABNORMAL HIGH (ref 4.8–5.6)
Mean Plasma Glucose: 125.5 mg/dL

## 2023-09-22 LAB — CG4 I-STAT (LACTIC ACID): Lactic Acid, Venous: 1.6 mmol/L (ref 0.5–1.9)

## 2023-09-22 MED ORDER — INSULIN ASPART 100 UNIT/ML IJ SOLN: 0.0000 [IU] | INTRAMUSCULAR | Status: DC

## 2023-09-22 MED ORDER — ACETAMINOPHEN 500 MG PO TABS
1000.0000 mg | ORAL_TABLET | Freq: Four times a day (QID) | ORAL | Status: DC | PRN
  Administered 2023-09-27: 1000 mg via ORAL
  Filled 2023-09-22: qty 2

## 2023-09-22 MED ORDER — ACETAMINOPHEN 650 MG RE SUPP: 650.0000 mg | Freq: Four times a day (QID) | RECTAL | Status: DC | PRN

## 2023-09-22 MED ORDER — MODAFINIL 100 MG PO TABS
300.0000 mg | ORAL_TABLET | Freq: Every day | ORAL | Status: DC
  Administered 2023-09-22 – 2023-09-28 (×7): 300 mg via ORAL
  Filled 2023-09-22 (×8): qty 3

## 2023-09-22 MED ORDER — ROSUVASTATIN CALCIUM 20 MG PO TABS
20.0000 mg | ORAL_TABLET | Freq: Every day | ORAL | Status: DC
  Administered 2023-09-22 – 2023-09-23 (×2): 20 mg via ORAL
  Filled 2023-09-22 (×2): qty 1

## 2023-09-22 MED ORDER — LEVOTHYROXINE SODIUM 75 MCG PO TABS
150.0000 ug | ORAL_TABLET | Freq: Every day | ORAL | Status: DC
  Administered 2023-09-22 – 2023-09-28 (×7): 150 ug via ORAL
  Filled 2023-09-22 (×7): qty 2

## 2023-09-22 MED ORDER — BOOST / RESOURCE BREEZE PO LIQD CUSTOM
1.0000 | Freq: Three times a day (TID) | ORAL | Status: DC
  Administered 2023-09-22 (×3): 1 via ORAL
  Administered 2023-09-23 (×2): 237 mL via ORAL
  Administered 2023-09-23 – 2023-09-24 (×2): 1 via ORAL
  Administered 2023-09-24 (×2): 237 mL via ORAL
  Administered 2023-09-25 – 2023-09-27 (×3): 1 via ORAL

## 2023-09-22 MED ORDER — LAMOTRIGINE 25 MG PO TABS
150.0000 mg | ORAL_TABLET | Freq: Every day | ORAL | Status: DC
  Administered 2023-09-22 – 2023-09-27 (×7): 150 mg via ORAL
  Filled 2023-09-22 (×7): qty 6

## 2023-09-22 MED ORDER — INSULIN ASPART 100 UNIT/ML IJ SOLN
0.0000 [IU] | Freq: Three times a day (TID) | INTRAMUSCULAR | Status: DC
  Administered 2023-09-22 – 2023-09-26 (×2): 1 [IU] via SUBCUTANEOUS

## 2023-09-22 MED ORDER — KETOROLAC TROMETHAMINE 15 MG/ML IJ SOLN
15.0000 mg | Freq: Four times a day (QID) | INTRAMUSCULAR | Status: DC
  Administered 2023-09-22 – 2023-09-26 (×17): 15 mg via INTRAVENOUS
  Filled 2023-09-22 (×17): qty 1

## 2023-09-22 MED ORDER — MODAFINIL 100 MG PO TABS: 200.0000 mg | ORAL_TABLET | Freq: Every day | ORAL | Status: DC

## 2023-09-22 MED ORDER — DEXTROSE 5 % IV SOLN: INTRAVENOUS | Status: DC

## 2023-09-22 MED ORDER — MORPHINE SULFATE (PF) 2 MG/ML IV SOLN: 2.0000 mg | INTRAVENOUS | Status: DC | PRN

## 2023-09-22 MED ORDER — SERTRALINE HCL 100 MG PO TABS
200.0000 mg | ORAL_TABLET | Freq: Every day | ORAL | Status: DC
  Administered 2023-09-22 – 2023-09-28 (×7): 200 mg via ORAL
  Filled 2023-09-22 (×7): qty 2

## 2023-09-22 MED ORDER — TRAMADOL HCL 50 MG PO TABS
50.0000 mg | ORAL_TABLET | Freq: Three times a day (TID) | ORAL | Status: DC | PRN
  Administered 2023-09-22: 50 mg via ORAL
  Filled 2023-09-22: qty 1

## 2023-09-22 NOTE — Progress Notes (Addendum)
[facility] Progress Note     Subjective: CC:  Alexandra Beck, reports RLQ and SP abdominal pain, stable to improved. In hindsight she does report some abdominal symptoms pre-dating her outpatient workup/CT that were more vague. She developed worsening pain, fevers, overall feeling poorly so she came to hospital. She states she might have some air in her urine but her UA was completely negative. At baseline she lives with her wife of 5 years and works as a Comptroller. She denies a history of abdominal surgery or colonoscopy. She does reports being overweight for most of her lift but has gained more weight in the last 5 years, stating she weighed around 333 lbs 5 years ago. She is involved at healthy weight and wellness and takes ozempic, last dose Friday.    Objective: Vital signs in last 24 hours: Temp:  [99.2 F (37.3 C)-102.5 F (39.2 C)] 99.2 F (37.3 C) (10/25 0533) Pulse Rate:  [68-85] 69 (10/25 0533) Resp:  [16-26] 20 (10/25 0533) BP: (99-162)/(42-71) 104/42 (10/25 0533) SpO2:  [92 %-100 %] 99 % (10/25 0533) Weight:  [187.8 kg-188.7 kg] 188.7 kg (10/25 0523) Last BM Date : 09/21/23  Intake/Output from previous day: 10/24 0701 - 10/25 0700 In: 100 [IV Piggyback:100] Out: 600 [Urine:600] Intake/Output this shift: Total I/O In: -  Out: 300 [Urine:300]  PE: Gen:  Alert, NAD, pleasant Card:  Regular rate and rhythm Pulm:  Normal effort ORA Abd: Soft, TTP RLQ and SP region, under pannus, there is not guarding or rebound tenderness, no palpable hernias. Mild skin changes over lower abdominal wall Skin: warm and dry, skin irritation of skin fold behind R knee Psych: A&Ox3   Lab Results:  Recent Labs    09/21/23 1704 09/22/23 0721  WBC 9.4 7.1  HGB 10.7* 9.0*  HCT 36.1 29.4*  PLT 277 219   BMET Recent Labs    09/21/23 1704 09/22/23 0721  NA 137 138  K 3.9 3.5  CL 98 99  CO2 24 26  GLUCOSE 94 97  BUN 20 19  CREATININE 1.22* 1.35*  CALCIUM 9.1 8.6*    PT/INR Recent Labs    09/21/23 1704  LABPROT 14.9  INR 1.2   CMP     Component Value Date/Time   NA 138 09/22/2023 0721   NA 141 03/13/2023 1511   K 3.5 09/22/2023 0721   CL 99 09/22/2023 0721   CO2 26 09/22/2023 0721   GLUCOSE 97 09/22/2023 0721   BUN 19 09/22/2023 0721   BUN 16 03/13/2023 1511   CREATININE 1.35 (H) 09/22/2023 0721   CREATININE 1.06 11/14/2016 1050   CALCIUM 8.6 (L) 09/22/2023 0721   CALCIUM 9.6 06/26/2007 0000   PROT 7.4 09/21/2023 1704   PROT 6.6 03/13/2023 1511   ALBUMIN 3.5 09/21/2023 1704   ALBUMIN 3.8 (L) 03/13/2023 1511   AST 19 09/21/2023 1704   ALT 15 09/21/2023 1704   ALKPHOS 74 09/21/2023 1704   BILITOT 0.5 09/21/2023 1704   BILITOT 0.2 03/13/2023 1511   GFRNONAA 50 (L) 09/22/2023 0721   GFRAA 81 11/11/2020 1211   Lipase     Component Value Date/Time   LIPASE 48.0 09/23/2022 1134       Studies/Results: CT ABDOMEN PELVIS W CONTRAST  Result Date: 09/21/2023 CLINICAL DATA:  Left lower quadrant pain.  Diverticulitis. EXAM: CT ABDOMEN AND PELVIS WITH CONTRAST TECHNIQUE: Multidetector CT imaging of the abdomen and pelvis was performed using the standard protocol following bolus administration of intravenous contrast. RADIATION DOSE  REDUCTION: This exam was performed according to the departmental dose-optimization program which includes automated exposure control, adjustment of the mA and/or kV according to patient size and/or use of iterative reconstruction technique. CONTRAST:  75mL OMNIPAQUE IOHEXOL 350 MG/ML SOLN COMPARISON:  CT 03/19/2023 FINDINGS: Lower chest: The heart is borderline enlarged. No basilar airspace disease or pleural effusion. Hepatobiliary: Enlarged liver spanning 21.2 cm cranial caudal. No focal liver abnormality. Calcified gallstone without pericholecystic inflammation. No biliary dilatation. Pancreas: No ductal dilatation or inflammation. Spleen: Enlarged, 15.1 cm AP. Adrenals/Urinary Tract: No adrenal nodule. Lobulated  bilateral renal contours. No hydronephrosis. No renal calculi. Moderate wall thickening about the dome of the bladder, but no definite intravesicular air. Stomach/Bowel: Moderate length segment of colonic wall thickening in the proximal sigmoid in the region of multiple diverticula, likely representing diverticulitis. There is a poorly defined extraluminal collection extending inferior to the inflamed colon abutting the dome of the bladder suspicious for abscess. This measures approximately 5.6 x 6.9 x 3.2 cm, series 3, image 72 and sagittal series 7, image 100. Collection is poorly defined with internal air and predominantly soft tissue thickening, small amount of intraluminal fluid. Ill-defined soft tissue tract of sent from the colon. Collection abuts the dome of the bladder with associated wall thickening. Multiple additional noninflamed colonic diverticula. No small bowel obstruction or inflammation. Vascular/Lymphatic: Normal caliber abdominal aorta. No evidence of portal or mesenteric thrombus. There prominent bilateral inguinal and external iliac nodes are likely reactive. Reproductive: Colonic inflammatory changes extend to the anterior aspect of the uterus. Adnexa are poorly defined. Other: Other than the pericolonic collection there is no pneumoperitoneum. No significant free fluid. Generalized edema in the body wall which appears to be confluent in the lower abdominal pannus. Musculoskeletal: L5-S1 degenerative disc disease and facet hypertrophy. There are no acute or suspicious osseous abnormalities. IMPRESSION: 1. Acute sigmoid diverticulitis with poorly defined extraluminal collection extending inferior to the inflamed colon abutting the dome of the bladder suspicious for abscess. This measures approximately 5.6 x 6.9 x 3.2 cm. 2. Colonic inflammatory changes extend to the dome of the urinary bladder with associated bladder wall thickening as well as spot anterior aspect of the uterus. 3.  Hepatosplenomegaly. 4. Cholelithiasis. 5. Generalized edema in the body wall which appears to be confluent in the lower abdominal pannus. Electronically Signed   By: Narda Rutherford M.D.   On: 09/21/2023 21:25   DG Chest 1 View  Result Date: 09/21/2023 CLINICAL DATA:  Abdominal pain.  Fever.  Sepsis. EXAM: CHEST  1 VIEW COMPARISON:  06/03/2022. FINDINGS: Bilateral lung fields are clear. Elevated right hemidiaphragm again seen. Bilateral costophrenic angles are clear. Normal cardio-mediastinal silhouette. No acute osseous abnormalities. The soft tissues are within normal limits. No free air under the domes of diaphragm. IMPRESSION: No active disease. Electronically Signed   By: Jules Schick M.D.   On: 09/21/2023 18:20    Anti-infectives: Anti-infectives (From admission, onward)    Start     Dose/Rate Route Frequency Ordered Stop   09/22/23 0400  piperacillin-tazobactam (ZOSYN) IVPB 3.375 g        3.375 g 12.5 mL/hr over 240 Minutes Intravenous Every 8 hours 09/21/23 2327     09/21/23 1715  cefTRIAXone (ROCEPHIN) 2 g in sodium chloride 0.9 % 100 mL IVPB        2 g 200 mL/hr over 30 Minutes Intravenous  Once 09/21/23 1701 09/21/23 2032   09/21/23 1715  metroNIDAZOLE (FLAGYL) IVPB 500 mg  500 mg 100 mL/hr over 60 Minutes Intravenous  Once 09/21/23 1701 09/21/23 2212        Assessment/Plan  Diverticulitis with abscess - AFVSS, WBC 7.1, she did have a fever yesterday and reports fever of 102 on day of admission - CT 10/24 shows an abscess ~5.5 cm arising from the sigmoid colon, abutting the bladder. IR reviewed and it is not amenable to drainage.  - no emergent need for surgery today, she is hemodynamically stable without peritonitis. Plan to continue IV abx and repeat CT scan next week.  - failure to improve with non-operative measures would typically result in a partial colectomy/end colostomy. For this patient that would carry an extremely high risk for complications given her  body habitus, complications such as poor wound healing/dehiscence and stomal retraction with dermal dehiscence/leak/infection. This was discussed with the patient in detail.   HFpEF AKI - now on CLD, IVF per primary OSA on CPAP Depression Hypothyroidism  DM2 - A1c in April was 6.0, I do not see a more recent value  Severe morbid obesity, BMI 78, takes ozempic last dose Friday 10/18   LOS: 1 day   I reviewed nursing notes, ED provider notes, hospitalist notes, last 24 h vitals and pain scores, last 48 h intake and output, last 24 h labs and trends, and last 24 h imaging results.  This care required moderate level of medical decision making.   Hosie Spangle, PA-C [facility] Please see Amion for pager number during day hours 7:00am-4:30pm

## 2023-09-22 NOTE — Progress Notes (Signed)
HOSPITALIST ROUNDING NOTE Alexandra Beck [id number]  DOB: 07/18/1981  DOA: 09/21/2023  PCP: Corwin Levins, MD  09/22/2023,8:25 AM   LOS: 1 day      Code Status:  full  From:   home    Current Dispo:  unclear    42 year old white female HFpEF--- last EF 55-60% no regional wall motion of the mildly 04/2022 DM TY 2 BMI 78 OSA on CPAP Hypothyroidism Recurrent cellulitis foot with admissions for the same Currently follows with both healthy weight and wellness clinic as well as with psychiatry and has loss about 25 pounds since initiation on Ozempic  She was seen in the office on 10/18 with abd pain and discomfort --- CT 10/21 showed diverticulosis air-containing fluid collection CT repeated here with contrast showed 5.6 X6.9X 3.2 cm abscess  General Surgery, IR consulted   Plan Intra-abdominal abscess 6 x 7 x 3 cm Unfortunately not an ideal candidate for IR placement of drain given the central location-appreciate input Tmax in the 99 range but seems to be afebrile now Defer to general surgery plan?  Repeat CT?  TPN?  Other Placing on D5 50 cc/H as is n.p.o. and thirsty--diet as per surg Started Toradol 15 every 6 as needed moderate pain and increased IV morphine to 2 mg every 2 as needed severe pain  HFpEF Pulm losartan 100 torsemide 80 twice daily Aldactone 25 for now Blood pressures are relatively well-controlled holding all meds  AKI Fluids as above-Labs in a.m.  DM TY 2 ?  Diet-controlled versus not on meds Monitor trends every 4 if above 180 but so far not the case  Hypo-thyroid Continue Synthroid 150  Depression  continue Zoloft 200 daily with a sip of water, Lamictal 150 at bedtime Provigil 300 qd  OSA on CPAP    DVT prophylaxis: SCD   Status is: Inpatient Remains inpatient appropriate because:   Unclear disposition      Subjective: 7/10 pain looks a little bit uncomfortable No nausea vomiting  Objective + exam Vitals:   09/22/23 0039 09/22/23 0047  09/22/23 0523 09/22/23 0533  BP: (!) 115/56  (!) 100/45 (!) 104/42  Pulse: 71  68 69  Resp: 20  20 20   Temp: 99.5 F (37.5 C) 99.5 F (37.5 C) 99.2 F (37.3 C) 99.2 F (37.3 C)  TempSrc: Oral Oral Oral Oral  SpO2: 93%  99% 99%  Weight:   (!) 188.7 kg   Height:       Filed Weights   09/21/23 1702 09/22/23 0523  Weight: (!) 187.8 kg (!) 188.7 kg    Examination:  EOMI NCAT thick neck Mallampati 4 neck thick soft supple S1-S2 no murmur Abdomen slightly tender Lower extremity edema trace Euthymic coherent  Data Reviewed: reviewed   CBC    Component Value Date/Time   WBC 7.1 09/22/2023 0721   RBC 3.37 (L) 09/22/2023 0721   HGB 9.0 (L) 09/22/2023 0721   HGB 10.9 (L) 03/13/2023 1511   HCT 29.4 (L) 09/22/2023 0721   HCT 35.1 03/13/2023 1511   PLT 219 09/22/2023 0721   PLT 209 03/13/2023 1511   MCV 87.2 09/22/2023 0721   MCV 90 03/13/2023 1511   MCH 26.7 09/22/2023 0721   MCHC 30.6 09/22/2023 0721   RDW 15.7 (H) 09/22/2023 0721   RDW 15.1 03/13/2023 1511   LYMPHSABS 0.6 (L) 09/21/2023 1704   LYMPHSABS 1.2 03/13/2023 1511   MONOABS 0.5 09/21/2023 1704   EOSABS 0.1 09/21/2023 1704   EOSABS  0.2 03/13/2023 1511   BASOSABS 0.0 09/21/2023 1704   BASOSABS 0.0 03/13/2023 1511      Latest Ref Rng & Units 09/22/2023    7:21 AM 09/21/2023    5:04 PM 03/13/2023    3:11 PM  CMP  Glucose 70 - 99 mg/dL 97  94  621   BUN 6 - 20 mg/dL 19  20  16    Creatinine 0.44 - 1.00 mg/dL 3.08  6.57  8.46   Sodium 135 - 145 mmol/L 138  137  141   Potassium 3.5 - 5.1 mmol/L 3.5  3.9  4.6   Chloride 98 - 111 mmol/L 99  98  103   CO2 22 - 32 mmol/L 26  24  21    Calcium 8.9 - 10.3 mg/dL 8.6  9.1  9.4   Total Protein 6.5 - 8.1 g/dL  7.4  6.6   Total Bilirubin 0.3 - 1.2 mg/dL  0.5  0.2   Alkaline Phos 38 - 126 U/L  74  88   AST 15 - 41 U/L  19  9   ALT 0 - 44 U/L  15  12      Scheduled Meds:  insulin aspart  0-9 Units Subcutaneous Q4H   ketorolac  15 mg Intravenous Q6H   lamoTRIgine   150 mg Oral QHS   levothyroxine  150 mcg Oral Q0600   modafinil  300 mg Oral Daily   rosuvastatin  20 mg Oral Daily   sertraline  200 mg Oral Daily   Continuous Infusions:  piperacillin-tazobactam (ZOSYN)  IV 3.375 g (09/22/23 0435)    Time  45  Rhetta Mura, MD  [facility]

## 2023-09-22 NOTE — Hospital Course (Signed)
Alexandra Beck is a 42 y.o. female wit chronic HFpEF, h medical history significant for T2DM, HTN, HLD, hypothyroidism, OSA/OHS on CPAP who is admitted with acute sigmoid diverticulitis with abscess.

## 2023-09-22 NOTE — Progress Notes (Signed)
IR Procedure Request - Diverticular Abscess Drain placement  42 y.o. female. inpatient History of obesity, DM, HLD. Presented to the ED at [facility] n 10.24.24 with CT scan showing diverticulitis obtained as outpatient while being worked up with LLQ abdominal pain.  CT Abd pelvis from 10.24.24 reads acute sigmoid diverticulitis with poorly defined extraluminal collection extending inferior to the inflamed colon abutting the dome of the bladder suspicious for abscess. This measures approximately 5.6 x 6.9 x 3.2 cm  After review of procedure request by IR Attending Dr. Mauri Reading Mir area in question is too small  and too centrally located for percutaneous access. Due to the low probability Patient is not a candidate for percutaneous access. This was communicated to the Team via EPIC chat.

## 2023-09-22 NOTE — Progress Notes (Signed)
   09/22/23 1248  TOC Brief Assessment  Insurance and Status Reviewed  Patient has primary care physician Yes  Home environment has been reviewed yes  Prior level of function: independent  Prior/Current Home Services No current home services  Social Determinants of Health Reivew SDOH reviewed no interventions necessary  Readmission risk has been reviewed Yes  Transition of care needs no transition of care needs at this time

## 2023-09-22 NOTE — Progress Notes (Signed)
Pt. Was able to have someone bring in her home CPAP. Pt. stated she is able to put herself on whenever she is ready to go to sleep.

## 2023-09-23 DIAGNOSIS — K5720 Diverticulitis of large intestine with perforation and abscess without bleeding: Secondary | ICD-10-CM | POA: Diagnosis not present

## 2023-09-23 LAB — COMPREHENSIVE METABOLIC PANEL
ALT: 15 U/L (ref 0–44)
AST: 15 U/L (ref 15–41)
Albumin: 2.7 g/dL — ABNORMAL LOW (ref 3.5–5.0)
Alkaline Phosphatase: 82 U/L (ref 38–126)
Anion gap: 14 (ref 5–15)
BUN: 20 mg/dL (ref 6–20)
CO2: 28 mmol/L (ref 22–32)
Calcium: 8.7 mg/dL — ABNORMAL LOW (ref 8.9–10.3)
Chloride: 96 mmol/L — ABNORMAL LOW (ref 98–111)
Creatinine, Ser: 1.5 mg/dL — ABNORMAL HIGH (ref 0.44–1.00)
GFR, Estimated: 44 mL/min — ABNORMAL LOW (ref >60)
Glucose, Bld: 99 mg/dL (ref 70–99)
Potassium: 3.3 mmol/L — ABNORMAL LOW (ref 3.5–5.1)
Sodium: 138 mmol/L (ref 135–145)
Total Bilirubin: 0.3 mg/dL (ref 0.3–1.2)
Total Protein: 6.1 g/dL — ABNORMAL LOW (ref 6.5–8.1)

## 2023-09-23 LAB — GLUCOSE, CAPILLARY
Glucose-Capillary: 120 mg/dL — ABNORMAL HIGH (ref 70–99)
Glucose-Capillary: 111 mg/dL — ABNORMAL HIGH (ref 70–99)
Glucose-Capillary: 128 mg/dL — ABNORMAL HIGH (ref 70–99)
Glucose-Capillary: 112 mg/dL — ABNORMAL HIGH (ref 70–99)
Glucose-Capillary: 91 mg/dL (ref 70–99)

## 2023-09-23 LAB — CBC WITH DIFFERENTIAL/PLATELET
Abs Immature Granulocytes: 0.03 10*3/uL (ref 0.00–0.07)
Basophils Absolute: 0 10*3/uL (ref 0.0–0.1)
Basophils Relative: 0 %
Eosinophils Absolute: 0.2 10*3/uL (ref 0.0–0.5)
Eosinophils Relative: 2 %
HCT: 27.7 % — ABNORMAL LOW (ref 36.0–46.0)
Hemoglobin: 8.5 g/dL — ABNORMAL LOW (ref 12.0–15.0)
Immature Granulocytes: 0 %
Lymphocytes Relative: 13 %
Lymphs Abs: 1 10*3/uL (ref 0.7–4.0)
MCH: 26.5 pg (ref 26.0–34.0)
MCHC: 30.7 g/dL (ref 30.0–36.0)
MCV: 86.3 fL (ref 80.0–100.0)
Monocytes Absolute: 0.7 10*3/uL (ref 0.1–1.0)
Monocytes Relative: 10 %
Neutro Abs: 5.4 10*3/uL (ref 1.7–7.7)
Neutrophils Relative %: 75 %
Platelets: 218 10*3/uL (ref 150–400)
RBC: 3.21 MIL/uL — ABNORMAL LOW (ref 3.87–5.11)
RDW: 15.7 % — ABNORMAL HIGH (ref 11.5–15.5)
WBC: 7.4 10*3/uL (ref 4.0–10.5)
nRBC: 0 % (ref 0.0–0.2)

## 2023-09-23 NOTE — Progress Notes (Signed)
HOSPITALIST ROUNDING NOTE JANETE RECHNER NUU:[id number]  DOB: Dec 06, 1980  DOA: 09/21/2023  PCP: Corwin Levins, MD  09/23/2023,2:56 PM   LOS: 2 days      Code Status:  full  From:   home    Current Dispo:  unclear    42 year old white female HFpEF--- last EF 55-60% no regional wall motion of the mildly 04/2022 DM TY 2 BMI 78 OSA on CPAP Hypothyroidism Recurrent cellulitis foot with admissions for the same Currently follows with both healthy weight and wellness clinic as well as with psychiatry and has loss about 25 pounds since initiation on Ozempic  She was seen in the office on 10/18 with abd pain and discomfort --- CT 10/21 showed diverticulosis air-containing fluid collection CT repeated here with contrast showed 5.6 X6.9X 3.2 cm abscess  General Surgery, IR consulted   Plan Intra-abdominal abscess 6 x 7 x 3 cm not Candidate for IR placement of drain given the central location Tmax in the 99.5 range-continues on IV Zosyn Short-term Toradol 15 every 6 as needed moderate pain --tramadol Q8 for moderate pain, IV morphine to 2 mg every 2 as needed severe pain It is unclear whether she will eventually need surgery which per surgery consultants carries a high risk of perioperative morbidity mortality  HFpEF Hold for now losartan 100 torsemide 80 twice daily Aldactone 25  Blood pressures low-normal--watch clsely for s/s hypotension  AKI Fluids as above-Labs in a.m.  DM TY 2 ?  Diet-controlled versus not on meds Monitor trends every 4 if above 180 but so far not the case  Hypo-thyroid Continue Synthroid 150  Depression Continue Zoloft 200 daily with a sip of water, Lamictal 150 at bedtime Provigil 300 qd  OSA on CPAP    DVT prophylaxis: SCD   Status is: Inpatient Remains inpatient appropriate because:   Unclear disposition      Subjective: Walking in hallways Pain manageable Many q's about overall plan  Objective + exam Vitals:   09/22/23 0955 09/22/23 1151  09/22/23 1552 09/23/23 0820  BP: 128/67 107/63 (!) 112/41 (!) 92/41  Pulse: 67 72 65 62  Resp: 19 19 18 16   Temp: 99 F (37.2 C) 99.5 F (37.5 C) 98.9 F (37.2 C) 98 F (36.7 C)  TempSrc: Oral Oral Oral Oral  SpO2: 93% 95% 93% 96%  Weight:      Height:       Filed Weights   09/21/23 1702 09/22/23 0523  Weight: (!) 187.8 kg (!) 188.7 kg    Examination:  EOMI NCAT thick neck Mallampati 4 neck thick soft supple S1-S2 no murmur Abdomen slightly tender   Data Reviewed: reviewed   CBC    Component Value Date/Time   WBC 7.4 09/23/2023 0451   RBC 3.21 (L) 09/23/2023 0451   HGB 8.5 (L) 09/23/2023 0451   HGB 10.9 (L) 03/13/2023 1511   HCT 27.7 (L) 09/23/2023 0451   HCT 35.1 03/13/2023 1511   PLT 218 09/23/2023 0451   PLT 209 03/13/2023 1511   MCV 86.3 09/23/2023 0451   MCV 90 03/13/2023 1511   MCH 26.5 09/23/2023 0451   MCHC 30.7 09/23/2023 0451   RDW 15.7 (H) 09/23/2023 0451   RDW 15.1 03/13/2023 1511   LYMPHSABS 1.0 09/23/2023 0451   LYMPHSABS 1.2 03/13/2023 1511   MONOABS 0.7 09/23/2023 0451   EOSABS 0.2 09/23/2023 0451   EOSABS 0.2 03/13/2023 1511   BASOSABS 0.0 09/23/2023 0451   BASOSABS 0.0 03/13/2023 1511  Latest Ref Rng & Units 09/23/2023    4:51 AM 09/22/2023    7:21 AM 09/21/2023    5:04 PM  CMP  Glucose 70 - 99 mg/dL 99  97  94   BUN 6 - 20 mg/dL 20  19  20    Creatinine 0.44 - 1.00 mg/dL 7.84  6.96  2.95   Sodium 135 - 145 mmol/L 138  138  137   Potassium 3.5 - 5.1 mmol/L 3.3  3.5  3.9   Chloride 98 - 111 mmol/L 96  99  98   CO2 22 - 32 mmol/L 28  26  24    Calcium 8.9 - 10.3 mg/dL 8.7  8.6  9.1   Total Protein 6.5 - 8.1 g/dL 6.1   7.4   Total Bilirubin 0.3 - 1.2 mg/dL 0.3   0.5   Alkaline Phos 38 - 126 U/L 82   74   AST 15 - 41 U/L 15   19   ALT 0 - 44 U/L 15   15      Scheduled Meds:  feeding supplement  1 Container Oral TID BM   insulin aspart  0-6 Units Subcutaneous TID WC   ketorolac  15 mg Intravenous Q6H   lamoTRIgine  150 mg  Oral QHS   levothyroxine  150 mcg Oral Q0600   modafinil  300 mg Oral Daily   rosuvastatin  20 mg Oral Daily   sertraline  200 mg Oral Daily   Continuous Infusions:  piperacillin-tazobactam (ZOSYN)  IV 3.375 g (09/23/23 1238)    Time  22  Rhetta Mura, MD  [facility]

## 2023-09-23 NOTE — Plan of Care (Signed)
  Problem: Pain Management: Goal: General experience of comfort will improve Outcome: Progressing   Problem: Safety: Goal: Ability to remain free from injury will improve Outcome: Progressing

## 2023-09-23 NOTE — Progress Notes (Signed)
Mobility Specialist: Progress Note   09/23/23 1527  Mobility  Activity Ambulated independently in hallway  Level of Assistance Independent  Assistive Device Other (Comment) (IV pole)  Distance Ambulated (ft) 300 ft  Activity Response Tolerated well  Mobility Referral Yes  $Mobility charge 1 Mobility  Mobility Specialist Start Time (ACUTE ONLY) 1354  Mobility Specialist Stop Time (ACUTE ONLY) 1406  Mobility Specialist Time Calculation (min) (ACUTE ONLY) 12 min    Pt was agreeable to mobility session - received in chair. Had c/o pain but tolerable. Ambulated in the hallway independently without fault. Left in chair with all needs met, call bell in reach.   Maurene Capes Mobility Specialist Please contact via SecureChat or Rehab office at [phone]

## 2023-09-23 NOTE — Progress Notes (Signed)
Assessment & Plan: HD#3 - Diverticulitis with abscess - now afebrile, WBC remains normal - CT 10/24 shows an abscess ~5.5 cm arising from the sigmoid colon, abutting the bladder. IR reviewed and it is not amenable to drainage.  - plan to continue IV abx and repeat CT scan next week.  - failure to improve with non-operative measures would typically result in a partial colectomy/end colostomy. Potentially high risk for this patient.  Will discuss with colorectal surgery if needed.   AKI - now on CLD, IVF per primary OSA on CPAP Depression Hypothyroidism  DM2 Severe morbid obesity - BMI 78, takes ozempic last dose Friday 10/18  Patient comfortable, mild pain.  Lots of questions regarding plans for management.        Darnell Level, MD [facility] practice Office: [phone]        Chief Complaint: Abdominal pain, diverticulitis  Subjective: Patient in bed, comfortable.  Objective: Vital signs in last 24 hours: Temp:  [98 F (36.7 C)-99.5 F (37.5 C)] 98 F (36.7 C) (10/26 0820) Pulse Rate:  [62-72] 62 (10/26 0820) Resp:  [16-19] 16 (10/26 0820) BP: (92-128)/(41-67) 92/41 (10/26 0820) SpO2:  [93 %-96 %] 96 % (10/26 0820) Last BM Date : 09/21/23  Intake/Output from previous day: 10/25 0701 - 10/26 0700 In: 1330 [P.O.:1230; IV Piggyback:100] Out: 1250 [Urine:1250] Intake/Output this shift: No intake/output data recorded.  Physical Exam: HEENT - sclerae clear, mucous membranes moist Abdomen - soft, obese; mild tenderness left abdomen, no guarding, no mass  Lab Results:  Recent Labs    09/22/23 0721 09/23/23 0451  WBC 7.1 7.4  HGB 9.0* 8.5*  HCT 29.4* 27.7*  PLT 219 218   BMET Recent Labs    09/22/23 0721 09/23/23 0451  NA 138 138  K 3.5 3.3*  CL 99 96*  CO2 26 28  GLUCOSE 97 99  BUN 19 20  CREATININE 1.35* 1.50*  CALCIUM 8.6* 8.7*   PT/INR Recent Labs    09/21/23 1704 09/22/23 0855  LABPROT 14.9 15.1  INR 1.2 1.2    Comprehensive Metabolic Panel:    Component Value Date/Time   NA 138 09/23/2023 0451   NA 138 09/22/2023 0721   NA 141 03/13/2023 1511   NA 141 09/14/2021 1331   K 3.3 (L) 09/23/2023 0451   K 3.5 09/22/2023 0721   CL 96 (L) 09/23/2023 0451   CL 99 09/22/2023 0721   CO2 28 09/23/2023 0451   CO2 26 09/22/2023 0721   BUN 20 09/23/2023 0451   BUN 19 09/22/2023 0721   BUN 16 03/13/2023 1511   BUN 26 (H) 09/14/2021 1331   CREATININE 1.50 (H) 09/23/2023 0451   CREATININE 1.35 (H) 09/22/2023 0721   CREATININE 1.06 11/14/2016 1050   GLUCOSE 99 09/23/2023 0451   GLUCOSE 97 09/22/2023 0721   CALCIUM 8.7 (L) 09/23/2023 0451   CALCIUM 8.6 (L) 09/22/2023 0721   CALCIUM 9.6 06/26/2007 0000   AST 15 09/23/2023 0451   AST 19 09/21/2023 1704   ALT 15 09/23/2023 0451   ALT 15 09/21/2023 1704   ALKPHOS 82 09/23/2023 0451   ALKPHOS 74 09/21/2023 1704   BILITOT 0.3 09/23/2023 0451   BILITOT 0.5 09/21/2023 1704   BILITOT 0.2 03/13/2023 1511   BILITOT 0.2 09/14/2021 1331   PROT 6.1 (L) 09/23/2023 0451   PROT 7.4 09/21/2023 1704   PROT 6.6 03/13/2023 1511   PROT 7.2 09/14/2021 1331   ALBUMIN 2.7 (L) 09/23/2023 0451  ALBUMIN 3.5 09/21/2023 1704   ALBUMIN 3.8 (L) 03/13/2023 1511   ALBUMIN 4.1 09/14/2021 1331    Studies/Results: CT ABDOMEN PELVIS W CONTRAST  Result Date: 09/21/2023 CLINICAL DATA:  Left lower quadrant pain.  Diverticulitis. EXAM: CT ABDOMEN AND PELVIS WITH CONTRAST TECHNIQUE: Multidetector CT imaging of the abdomen and pelvis was performed using the standard protocol following bolus administration of intravenous contrast. RADIATION DOSE REDUCTION: This exam was performed according to the departmental dose-optimization program which includes automated exposure control, adjustment of the mA and/or kV according to patient size and/or use of iterative reconstruction technique. CONTRAST:  75mL OMNIPAQUE IOHEXOL 350 MG/ML SOLN COMPARISON:  CT 03/19/2023 FINDINGS: Lower chest: The  heart is borderline enlarged. No basilar airspace disease or pleural effusion. Hepatobiliary: Enlarged liver spanning 21.2 cm cranial caudal. No focal liver abnormality. Calcified gallstone without pericholecystic inflammation. No biliary dilatation. Pancreas: No ductal dilatation or inflammation. Spleen: Enlarged, 15.1 cm AP. Adrenals/Urinary Tract: No adrenal nodule. Lobulated bilateral renal contours. No hydronephrosis. No renal calculi. Moderate wall thickening about the dome of the bladder, but no definite intravesicular air. Stomach/Bowel: Moderate length segment of colonic wall thickening in the proximal sigmoid in the region of multiple diverticula, likely representing diverticulitis. There is a poorly defined extraluminal collection extending inferior to the inflamed colon abutting the dome of the bladder suspicious for abscess. This measures approximately 5.6 x 6.9 x 3.2 cm, series 3, image 72 and sagittal series 7, image 100. Collection is poorly defined with internal air and predominantly soft tissue thickening, small amount of intraluminal fluid. Ill-defined soft tissue tract of sent from the colon. Collection abuts the dome of the bladder with associated wall thickening. Multiple additional noninflamed colonic diverticula. No small bowel obstruction or inflammation. Vascular/Lymphatic: Normal caliber abdominal aorta. No evidence of portal or mesenteric thrombus. There prominent bilateral inguinal and external iliac nodes are likely reactive. Reproductive: Colonic inflammatory changes extend to the anterior aspect of the uterus. Adnexa are poorly defined. Other: Other than the pericolonic collection there is no pneumoperitoneum. No significant free fluid. Generalized edema in the body wall which appears to be confluent in the lower abdominal pannus. Musculoskeletal: L5-S1 degenerative disc disease and facet hypertrophy. There are no acute or suspicious osseous abnormalities. IMPRESSION: 1. Acute sigmoid  diverticulitis with poorly defined extraluminal collection extending inferior to the inflamed colon abutting the dome of the bladder suspicious for abscess. This measures approximately 5.6 x 6.9 x 3.2 cm. 2. Colonic inflammatory changes extend to the dome of the urinary bladder with associated bladder wall thickening as well as spot anterior aspect of the uterus. 3. Hepatosplenomegaly. 4. Cholelithiasis. 5. Generalized edema in the body wall which appears to be confluent in the lower abdominal pannus. Electronically Signed   By: Narda Rutherford M.D.   On: 09/21/2023 21:25   DG Chest 1 View  Result Date: 09/21/2023 CLINICAL DATA:  Abdominal pain.  Fever.  Sepsis. EXAM: CHEST  1 VIEW COMPARISON:  06/03/2022. FINDINGS: Bilateral lung fields are clear. Elevated right hemidiaphragm again seen. Bilateral costophrenic angles are clear. Normal cardio-mediastinal silhouette. No acute osseous abnormalities. The soft tissues are within normal limits. No free air under the domes of diaphragm. IMPRESSION: No active disease. Electronically Signed   By: Jules Schick M.D.   On: 09/21/2023 18:20      Darnell Level 09/23/2023  Patient ID: Alexandra Beck, female   DOB: Nov 05, 1981, 42 y.o.   MRN: [id number]

## 2023-09-24 DIAGNOSIS — K5720 Diverticulitis of large intestine with perforation and abscess without bleeding: Secondary | ICD-10-CM | POA: Diagnosis not present

## 2023-09-24 LAB — COMPREHENSIVE METABOLIC PANEL
ALT: 17 U/L (ref 0–44)
AST: 13 U/L — ABNORMAL LOW (ref 15–41)
Albumin: 2.9 g/dL — ABNORMAL LOW (ref 3.5–5.0)
Alkaline Phosphatase: 102 U/L (ref 38–126)
Anion gap: 11 (ref 5–15)
BUN: 16 mg/dL (ref 6–20)
CO2: 26 mmol/L (ref 22–32)
Calcium: 8.8 mg/dL — ABNORMAL LOW (ref 8.9–10.3)
Chloride: 100 mmol/L (ref 98–111)
Creatinine, Ser: 1.37 mg/dL — ABNORMAL HIGH (ref 0.44–1.00)
GFR, Estimated: 49 mL/min — ABNORMAL LOW (ref >60)
Glucose, Bld: 102 mg/dL — ABNORMAL HIGH (ref 70–99)
Potassium: 3.2 mmol/L — ABNORMAL LOW (ref 3.5–5.1)
Sodium: 137 mmol/L (ref 135–145)
Total Bilirubin: 0.5 mg/dL (ref 0.3–1.2)
Total Protein: 6.6 g/dL (ref 6.5–8.1)

## 2023-09-24 LAB — GLUCOSE, CAPILLARY
Glucose-Capillary: 93 mg/dL (ref 70–99)
Glucose-Capillary: 103 mg/dL — ABNORMAL HIGH (ref 70–99)
Glucose-Capillary: 189 mg/dL — ABNORMAL HIGH (ref 70–99)
Glucose-Capillary: 126 mg/dL — ABNORMAL HIGH (ref 70–99)
Glucose-Capillary: 142 mg/dL — ABNORMAL HIGH (ref 70–99)

## 2023-09-24 MED ORDER — INFLUENZA VIRUS VACC SPLIT PF (FLUZONE) 0.5 ML IM SUSY
0.5000 mL | PREFILLED_SYRINGE | Freq: Once | INTRAMUSCULAR | Status: AC
  Administered 2023-09-25: 0.5 mL via INTRAMUSCULAR
  Filled 2023-09-24 (×2): qty 0.5

## 2023-09-24 NOTE — Plan of Care (Signed)
Problem: Education: Goal: Knowledge of General Education information will improve Description: Including pain rating scale, medication(s)/side effects and non-pharmacologic comfort measures 09/24/2023 1629 by Loetta Rough, LPN Outcome: Progressing 09/24/2023 1202 by Loetta Rough, LPN Outcome: Progressing   Problem: Health Behavior/Discharge Planning: Goal: Ability to manage health-related needs will improve 09/24/2023 1629 by Loetta Rough, LPN Outcome: Progressing 09/24/2023 1202 by Loetta Rough, LPN Outcome: Progressing   Problem: Clinical Measurements: Goal: Ability to maintain clinical measurements within normal limits will improve 09/24/2023 1629 by Loetta Rough, LPN Outcome: Progressing 09/24/2023 1202 by Loetta Rough, LPN Outcome: Progressing Goal: Will remain free from infection 09/24/2023 1629 by Loetta Rough, LPN Outcome: Progressing 09/24/2023 1202 by Loetta Rough, LPN Outcome: Progressing Goal: Diagnostic test results will improve 09/24/2023 1629 by Loetta Rough, LPN Outcome: Progressing 09/24/2023 1202 by Loetta Rough, LPN Outcome: Progressing Goal: Respiratory complications will improve 09/24/2023 1629 by Loetta Rough, LPN Outcome: Progressing 09/24/2023 1202 by Loetta Rough, LPN Outcome: Progressing Goal: Cardiovascular complication will be avoided 09/24/2023 1629 by Loetta Rough, LPN Outcome: Progressing 09/24/2023 1202 by Loetta Rough, LPN Outcome: Progressing   Problem: Activity: Goal: Risk for activity intolerance will decrease 09/24/2023 1629 by Loetta Rough, LPN Outcome: Progressing 09/24/2023 1202 by Loetta Rough, LPN Outcome: Progressing   Problem: Nutrition: Goal: Adequate nutrition will be maintained 09/24/2023 1629 by Loetta Rough, LPN Outcome: Progressing 09/24/2023 1202 by Loetta Rough, LPN Outcome: Progressing   Problem: Coping: Goal: Level of anxiety will  decrease 09/24/2023 1629 by Loetta Rough, LPN Outcome: Progressing 09/24/2023 1202 by Loetta Rough, LPN Outcome: Progressing   Problem: Elimination: Goal: Will not experience complications related to bowel motility 09/24/2023 1629 by Loetta Rough, LPN Outcome: Progressing 09/24/2023 1202 by Loetta Rough, LPN Outcome: Progressing Goal: Will not experience complications related to urinary retention 09/24/2023 1629 by Loetta Rough, LPN Outcome: Progressing 09/24/2023 1202 by Loetta Rough, LPN Outcome: Progressing   Problem: Pain Management: Goal: General experience of comfort will improve 09/24/2023 1629 by Loetta Rough, LPN Outcome: Progressing 09/24/2023 1202 by Loetta Rough, LPN Outcome: Progressing   Problem: Safety: Goal: Ability to remain free from injury will improve 09/24/2023 1629 by Loetta Rough, LPN Outcome: Progressing 09/24/2023 1202 by Loetta Rough, LPN Outcome: Progressing   Problem: Skin Integrity: Goal: Risk for impaired skin integrity will decrease 09/24/2023 1629 by Loetta Rough, LPN Outcome: Progressing 09/24/2023 1202 by Loetta Rough, LPN Outcome: Progressing   Problem: Education: Goal: Ability to describe self-care measures that may prevent or decrease complications (Diabetes Survival Skills Education) will improve 09/24/2023 1629 by Loetta Rough, LPN Outcome: Progressing 09/24/2023 1202 by Loetta Rough, LPN Outcome: Progressing Goal: Individualized Educational Video(s) 09/24/2023 1629 by Loetta Rough, LPN Outcome: Progressing 09/24/2023 1202 by Loetta Rough, LPN Outcome: Progressing   Problem: Coping: Goal: Ability to adjust to condition or change in health will improve 09/24/2023 1629 by Loetta Rough, LPN Outcome: Progressing 09/24/2023 1202 by Loetta Rough, LPN Outcome: Progressing   Problem: Fluid Volume: Goal: Ability to maintain a balanced intake and output will  improve 09/24/2023 1629 by Loetta Rough, LPN Outcome: Progressing 09/24/2023 1202 by Loetta Rough, LPN Outcome: Progressing   Problem: Health Behavior/Discharge Planning: Goal: Ability to identify and utilize available resources and services will improve 09/24/2023 1629 by Loetta Rough, LPN Outcome: Progressing 09/24/2023 1202 by Loetta Rough, LPN Outcome: Progressing Goal: Ability to manage health-related needs will improve 09/24/2023 1629 by Loetta Rough, LPN Outcome: Progressing 09/24/2023 1202 by Loetta Rough, LPN Outcome: Progressing   Problem: Metabolic: Goal: Ability to maintain appropriate glucose levels will improve  09/24/2023 1629 by Loetta Rough, LPN Outcome: Progressing 09/24/2023 1202 by Loetta Rough, LPN Outcome: Progressing   Problem: Nutritional: Goal: Maintenance of adequate nutrition will improve 09/24/2023 1629 by Loetta Rough, LPN Outcome: Progressing 09/24/2023 1202 by Loetta Rough, LPN Outcome: Progressing Goal: Progress toward achieving an optimal weight will improve 09/24/2023 1629 by Loetta Rough, LPN Outcome: Progressing 09/24/2023 1202 by Loetta Rough, LPN Outcome: Progressing   Problem: Skin Integrity: Goal: Risk for impaired skin integrity will decrease 09/24/2023 1629 by Loetta Rough, LPN Outcome: Progressing 09/24/2023 1202 by Loetta Rough, LPN Outcome: Progressing   Problem: Tissue Perfusion: Goal: Adequacy of tissue perfusion will improve 09/24/2023 1629 by Loetta Rough, LPN Outcome: Progressing 09/24/2023 1202 by Loetta Rough, LPN Outcome: Progressing

## 2023-09-24 NOTE — Progress Notes (Signed)
Assessment & Plan: HD#4 - Diverticulitis with abscess - CT 10/24 shows an abscess ~5.5 cm arising from the sigmoid colon, abutting the bladder. IR reviewed and it is not amenable to drainage.  - plan to continue IV abx - plan to repeat CT abd early this week    AKI - now on CLD, IVF per primary OSA on CPAP Depression Hypothyroidism  DM2 Severe morbid obesity - BMI 78, takes ozempic last dose Friday 10/18   Patient comfortable, mild pain.  Lots of questions regarding plans for management.  Discussed with patient and her mother this morning by phone.  Allow hard candy, gummy bears for comfort.        Alexandra Level, MD [facility] practice Office: [phone]        Chief Complaint: Complicated diverticulitis  Subjective: Patient up to chair, mother on iPhone.  Mild pain.  Objective: Vital signs in last 24 hours: Temp:  [98.2 F (36.8 C)-99.1 F (37.3 C)] 98.3 F (36.8 C) (10/27 0816) Pulse Rate:  [59-71] 59 (10/27 0816) Resp:  [17-18] 18 (10/27 0816) BP: (94-129)/(38-75) 94/58 (10/27 0816) SpO2:  [94 %-99 %] 94 % (10/27 0816) Weight:  [189.2 kg] 189.2 kg (10/27 0500) Last BM Date : 09/21/23  Intake/Output from previous day: 10/26 0701 - 10/27 0700 In: -  Out: 500 [Urine:500] Intake/Output this shift: Total I/O In: -  Out: 500 [Urine:500]  Physical Exam: HEENT - sclerae clear, mucous membranes moist Abdomen - soft, obese; mild tenderness mid lower abdomen; no guarding  Lab Results:  Recent Labs    09/22/23 0721 09/23/23 0451  WBC 7.1 7.4  HGB 9.0* 8.5*  HCT 29.4* 27.7*  PLT 219 218   BMET Recent Labs    09/22/23 0721 09/23/23 0451  NA 138 138  K 3.5 3.3*  CL 99 96*  CO2 26 28  GLUCOSE 97 99  BUN 19 20  CREATININE 1.35* 1.50*  CALCIUM 8.6* 8.7*   PT/INR Recent Labs    09/21/23 1704 09/22/23 0855  LABPROT 14.9 15.1  INR 1.2 1.2   Comprehensive Metabolic Panel:    Component Value Date/Time   NA 138  09/23/2023 0451   NA 138 09/22/2023 0721   NA 141 03/13/2023 1511   NA 141 09/14/2021 1331   K 3.3 (L) 09/23/2023 0451   K 3.5 09/22/2023 0721   CL 96 (L) 09/23/2023 0451   CL 99 09/22/2023 0721   CO2 28 09/23/2023 0451   CO2 26 09/22/2023 0721   BUN 20 09/23/2023 0451   BUN 19 09/22/2023 0721   BUN 16 03/13/2023 1511   BUN 26 (H) 09/14/2021 1331   CREATININE 1.50 (H) 09/23/2023 0451   CREATININE 1.35 (H) 09/22/2023 0721   CREATININE 1.06 11/14/2016 1050   GLUCOSE 99 09/23/2023 0451   GLUCOSE 97 09/22/2023 0721   CALCIUM 8.7 (L) 09/23/2023 0451   CALCIUM 8.6 (L) 09/22/2023 0721   CALCIUM 9.6 06/26/2007 0000   AST 15 09/23/2023 0451   AST 19 09/21/2023 1704   ALT 15 09/23/2023 0451   ALT 15 09/21/2023 1704   ALKPHOS 82 09/23/2023 0451   ALKPHOS 74 09/21/2023 1704   BILITOT 0.3 09/23/2023 0451   BILITOT 0.5 09/21/2023 1704   BILITOT 0.2 03/13/2023 1511   BILITOT 0.2 09/14/2021 1331   PROT 6.1 (L) 09/23/2023 0451   PROT 7.4 09/21/2023 1704   PROT 6.6 03/13/2023 1511   PROT 7.2 09/14/2021 1331   ALBUMIN 2.7 (L)  09/23/2023 0451   ALBUMIN 3.5 09/21/2023 1704   ALBUMIN 3.8 (L) 03/13/2023 1511   ALBUMIN 4.1 09/14/2021 1331    Studies/Results: No results found.    Alexandra Beck 09/24/2023  Patient ID: Alexandra Beck, female   DOB: [birth year], 42 y.o.   MRN: [id number]

## 2023-09-24 NOTE — Progress Notes (Signed)
HOSPITALIST ROUNDING NOTE Alexandra Beck [id number]:[id number]  DOB: 04-28-1981  DOA: 09/21/2023  PCP: Corwin Levins, MD  09/24/2023,3:02 PM   LOS: 3 days      Code Status:  full  From:   home    Current Dispo:  unclear    42 year old white female HFpEF--- last EF 55-60% no regional wall motion of the mildly 04/2022 DM TY 2 BMI 78 OSA on CPAP Hypothyroidism Recurrent cellulitis foot with admissions for the same Currently follows with both healthy weight and wellness clinic as well as with psychiatry and has loss about 25 pounds since initiation on Ozempic  She was seen in the office on 10/18 with abd pain and discomfort --- CT 10/21 showed diverticulosis air-containing fluid collection CT repeated here with contrast showed 5.6 X6.9X 3.2 cm abscess  General Surgery, IR consulted   Plan Intra-abdominal abscess 6 x 7 x 3 cm---not Candidate for IR placement of drain given the central location continues on IV Zosyn---Toradol 15 every 6 as needed moderate pain --tramadol Q8 for moderate pain, IV morphine to 2 mg every 2 as needed severe pain--- her pain is little bit increased today but she is also graduated to having hard candy and slightly more solid foods Imaging/Surgical plan deferred to colorectal gen surg who will see her tomorrow  HFpEF Hold losartan 100 torsemide 80 twice daily Aldactone 25  Hold IV fluid watch for hypotension  AKI Improved--hold IVF--force oral fluid   DM TY 2 ?  Diet-controlled versus not on meds Monitor trends every 4 if above 180 but so far not the case  Hypo-thyroid Continue Synthroid 150  Depression Continue Zoloft 200 daily with a sip of water, Lamictal 150 at bedtime Provigil 300 qd  OSA on CPAP  DVT prophylaxis: SCD   Status is: Inpatient Remains inpatient appropriate because:   Unclear disposition      Subjective:  Increasing pain-tolerating soft ICE and hard candy as per surgeon Several bouts of watery stool but is on liquids  Objective +  exam Vitals:   09/24/23 0123 09/24/23 0500 09/24/23 0531 09/24/23 0816  BP: 120/64  (!) 102/38 (!) 94/58  Pulse: 71  64 (!) 59  Resp: 18  18 18   Temp: 98.5 F (36.9 C)  98.2 F (36.8 C) 98.3 F (36.8 C)  TempSrc:    Oral  SpO2: 94%  98% 94%  Weight:  (!) 189.2 kg    Height:       Filed Weights   09/21/23 1702 09/22/23 0523 09/24/23 0500  Weight: (!) 187.8 kg (!) 188.7 kg (!) 189.2 kg    Examination:  EOMI NCAT thick neck Mallampati 4 Tender in central epigastrium No rebound Afebrile Chest is clear  Data Reviewed: reviewed   CBC    Component Value Date/Time   WBC 7.4 09/23/2023 0451   RBC 3.21 (L) 09/23/2023 0451   HGB 8.5 (L) 09/23/2023 0451   HGB 10.9 (L) 03/13/2023 1511   HCT 27.7 (L) 09/23/2023 0451   HCT 35.1 03/13/2023 1511   PLT 218 09/23/2023 0451   PLT 209 03/13/2023 1511   MCV 86.3 09/23/2023 0451   MCV 90 03/13/2023 1511   MCH 26.5 09/23/2023 0451   MCHC 30.7 09/23/2023 0451   RDW 15.7 (H) 09/23/2023 0451   RDW 15.1 03/13/2023 1511   LYMPHSABS 1.0 09/23/2023 0451   LYMPHSABS 1.2 03/13/2023 1511   MONOABS 0.7 09/23/2023 0451   EOSABS 0.2 09/23/2023 0451   EOSABS 0.2 03/13/2023 1511  BASOSABS 0.0 09/23/2023 0451   BASOSABS 0.0 03/13/2023 1511      Latest Ref Rng & Units 09/24/2023   10:24 AM 09/23/2023    4:51 AM 09/22/2023    7:21 AM  CMP  Glucose 70 - 99 mg/dL 409  99  97   BUN 6 - 20 mg/dL 16  20  19    Creatinine 0.44 - 1.00 mg/dL 8.11  9.14  7.82   Sodium 135 - 145 mmol/L 137  138  138   Potassium 3.5 - 5.1 mmol/L 3.2  3.3  3.5   Chloride 98 - 111 mmol/L 100  96  99   CO2 22 - 32 mmol/L 26  28  26    Calcium 8.9 - 10.3 mg/dL 8.8  8.7  8.6   Total Protein 6.5 - 8.1 g/dL 6.6  6.1    Total Bilirubin 0.3 - 1.2 mg/dL 0.5  0.3    Alkaline Phos 38 - 126 U/L 102  82    AST 15 - 41 U/L 13  15    ALT 0 - 44 U/L 17  15       Scheduled Meds:  feeding supplement  1 Container Oral TID BM   influenza vac split trivalent PF  0.5 mL  Intramuscular Once   insulin aspart  0-6 Units Subcutaneous TID WC   ketorolac  15 mg Intravenous Q6H   lamoTRIgine  150 mg Oral QHS   levothyroxine  150 mcg Oral Q0600   modafinil  300 mg Oral Daily   sertraline  200 mg Oral Daily   Continuous Infusions:  piperacillin-tazobactam (ZOSYN)  IV 3.375 g (09/24/23 1245)    Time  22  Rhetta Mura, MD  [facility]

## 2023-09-24 NOTE — Progress Notes (Signed)
Pt has home CPAP placed on by herself with no issues at this time.

## 2023-09-24 NOTE — Plan of Care (Signed)

## 2023-09-25 DIAGNOSIS — K5720 Diverticulitis of large intestine with perforation and abscess without bleeding: Secondary | ICD-10-CM | POA: Diagnosis not present

## 2023-09-25 LAB — COMPREHENSIVE METABOLIC PANEL
ALT: 15 U/L (ref 0–44)
AST: 11 U/L — ABNORMAL LOW (ref 15–41)
Albumin: 2.7 g/dL — ABNORMAL LOW (ref 3.5–5.0)
Alkaline Phosphatase: 88 U/L (ref 38–126)
Anion gap: 9 (ref 5–15)
BUN: 14 mg/dL (ref 6–20)
CO2: 27 mmol/L (ref 22–32)
Calcium: 8.5 mg/dL — ABNORMAL LOW (ref 8.9–10.3)
Chloride: 100 mmol/L (ref 98–111)
Creatinine, Ser: 1.4 mg/dL — ABNORMAL HIGH (ref 0.44–1.00)
GFR, Estimated: 48 mL/min — ABNORMAL LOW (ref >60)
Glucose, Bld: 113 mg/dL — ABNORMAL HIGH (ref 70–99)
Potassium: 2.9 mmol/L — ABNORMAL LOW (ref 3.5–5.1)
Sodium: 136 mmol/L (ref 135–145)
Total Bilirubin: 0.4 mg/dL (ref 0.3–1.2)
Total Protein: 6.4 g/dL — ABNORMAL LOW (ref 6.5–8.1)

## 2023-09-25 LAB — GLUCOSE, CAPILLARY
Glucose-Capillary: 114 mg/dL — ABNORMAL HIGH (ref 70–99)
Glucose-Capillary: 150 mg/dL — ABNORMAL HIGH (ref 70–99)
Glucose-Capillary: 111 mg/dL — ABNORMAL HIGH (ref 70–99)
Glucose-Capillary: 149 mg/dL — ABNORMAL HIGH (ref 70–99)

## 2023-09-25 LAB — CBC
HCT: 27.9 % — ABNORMAL LOW (ref 36.0–46.0)
Hemoglobin: 8.6 g/dL — ABNORMAL LOW (ref 12.0–15.0)
MCH: 27 pg (ref 26.0–34.0)
MCHC: 30.8 g/dL (ref 30.0–36.0)
MCV: 87.5 fL (ref 80.0–100.0)
Platelets: 242 10*3/uL (ref 150–400)
RBC: 3.19 MIL/uL — ABNORMAL LOW (ref 3.87–5.11)
RDW: 15.6 % — ABNORMAL HIGH (ref 11.5–15.5)
WBC: 6.1 10*3/uL (ref 4.0–10.5)
nRBC: 0 % (ref 0.0–0.2)

## 2023-09-25 NOTE — Progress Notes (Incomplete)
HOSPITALIST ROUNDING NOTE JAXIE SOKOLOWSKI ZOX:[id number]  DOB: 1981/02/14  DOA: 09/21/2023  PCP: Corwin Levins, MD  09/25/2023,4:00 PM   LOS: 4 days      Code Status: ***  From:  ***    Current Dispo: ***     ***        Plan  ***       DVT prophylaxis: ***  Status is: Inpatient {Inpatient:23812}      Subjective: ***  Objective + exam Vitals:   09/24/23 2005 09/25/23 0305 09/25/23 0312 09/25/23 0845  BP: 118/62 (!) 128/58  106/62  Pulse: 70 69  66  Resp: 19 17  17   Temp: 98.6 F (37 C) 98.3 F (36.8 C)  98.2 F (36.8 C)  TempSrc: Oral Oral  Oral  SpO2: 96% 98%  92%  Weight:   (!) 189.1 kg   Height:       Filed Weights   09/22/23 0523 09/24/23 0500 09/25/23 0312  Weight: (!) 188.7 kg (!) 189.2 kg (!) 189.1 kg    Examination:    Data Reviewed: reviewed   CBC    Component Value Date/Time   WBC 6.1 09/25/2023 0859   RBC 3.19 (L) 09/25/2023 0859   HGB 8.6 (L) 09/25/2023 0859   HGB 10.9 (L) 03/13/2023 1511   HCT 27.9 (L) 09/25/2023 0859   HCT 35.1 03/13/2023 1511   PLT 242 09/25/2023 0859   PLT 209 03/13/2023 1511   MCV 87.5 09/25/2023 0859   MCV 90 03/13/2023 1511   MCH 27.0 09/25/2023 0859   MCHC 30.8 09/25/2023 0859   RDW 15.6 (H) 09/25/2023 0859   RDW 15.1 03/13/2023 1511   LYMPHSABS 1.0 09/23/2023 0451   LYMPHSABS 1.2 03/13/2023 1511   MONOABS 0.7 09/23/2023 0451   EOSABS 0.2 09/23/2023 0451   EOSABS 0.2 03/13/2023 1511   BASOSABS 0.0 09/23/2023 0451   BASOSABS 0.0 03/13/2023 1511      Latest Ref Rng & Units 09/25/2023    8:59 AM 09/24/2023   10:24 AM 09/23/2023    4:51 AM  CMP  Glucose 70 - 99 mg/dL 811  914  99   BUN 6 - 20 mg/dL 14  16  20    Creatinine 0.44 - 1.00 mg/dL 7.82  9.56  2.13   Sodium 135 - 145 mmol/L 136  137  138   Potassium 3.5 - 5.1 mmol/L 2.9  3.2  3.3   Chloride 98 - 111 mmol/L 100  100  96   CO2 22 - 32 mmol/L 27  26  28    Calcium 8.9 - 10.3 mg/dL 8.5  8.8  8.7   Total Protein 6.5 - 8.1 g/dL 6.4  6.6  6.1    Total Bilirubin 0.3 - 1.2 mg/dL 0.4  0.5  0.3   Alkaline Phos 38 - 126 U/L 88  102  82   AST 15 - 41 U/L 11  13  15    ALT 0 - 44 U/L 15  17  15       Scheduled Meds:  feeding supplement  1 Container Oral TID BM   insulin aspart  0-6 Units Subcutaneous TID WC   ketorolac  15 mg Intravenous Q6H   lamoTRIgine  150 mg Oral QHS   levothyroxine  150 mcg Oral Q0600   modafinil  300 mg Oral Daily   sertraline  200 mg Oral Daily   Continuous Infusions:  piperacillin-tazobactam (ZOSYN)  IV 3.375 g (09/25/23 1211)    Time  ***  Rhetta Mura, MD  [facility]

## 2023-09-25 NOTE — Progress Notes (Signed)
   09/25/23 1950  BiPAP/CPAP/SIPAP  $ Non-Invasive Home Ventilator  Subsequent  Patient Home Equipment Yes   Pt self sufficient enough to place CPAP on by themselves.

## 2023-09-25 NOTE — Progress Notes (Signed)
HOSPITALIST ROUNDING NOTE [name] CRINCOLI [id number]  DOB: November 11, 1981  DOA: 09/21/2023  PCP: Corwin Levins, MD  09/25/2023,3:56 PM   LOS: 4 days      Code Status:  full  From:   home    Current Dispo:  unclear    42 year old white female HFpEF--- last EF 55-60% no regional wall motion of the mildly 04/2022 DM TY 2 BMI 78 OSA on CPAP Hypothyroidism Recurrent cellulitis foot with admissions for the same Currently follows with both healthy weight and wellness clinic as well as with psychiatry and has loss about 25 pounds since initiation on Ozempic  She was seen in the office on 10/18 with abd pain and discomfort --- CT 10/21 showed diverticulosis air-containing fluid collection CT repeated here with contrast showed 5.6 X6.9X 3.2 cm abscess  General Surgery, IR consulted   Plan Intra-abdominal abscess 6 x 7 x 3 cm---not Candidate for IR placement of drain given the central location continues on IV Zosyn---pain managed Imaging/Surgical plan deferred to colorectal gen surg   HFpEF Hold losartan 100 torsemide 80 twice daily Aldactone 25  Hold IV fluid watch for hypotension  AKI Improved--hold IVF--force oral fluid   DM TY 2 ?  Diet-controlled versus not on meds Monitor trends every 4 if above 180 but so far not the case  Hypo-thyroid Continue Synthroid 150  Depression Continue Zoloft 200 daily with a sip of water, Lamictal 150 at bedtime Provigil 300 qd  OSA on CPAP  DVT prophylaxis: SCD   Status is: Inpatient Remains inpatient appropriate because:   Unclear disposition      Subjective:  Pain same Some diarrhea Await definitive plan  Objective + exam Vitals:   09/24/23 2005 09/25/23 0305 09/25/23 0312 09/25/23 0845  BP: 118/62 (!) 128/58  106/62  Pulse: 70 69  66  Resp: 19 17  17   Temp: 98.6 F (37 C) 98.3 F (36.8 C)  98.2 F (36.8 C)  TempSrc: Oral Oral  Oral  SpO2: 96% 98%  92%  Weight:   (!) 189.1 kg   Height:       Filed Weights   09/22/23 0523  09/24/23 0500 09/25/23 0312  Weight: (!) 188.7 kg (!) 189.2 kg (!) 189.1 kg    Examination:  EOMI NCAT thick neck Mallampati 4 Tender in central epigastrium some---habitus makes exam difficult No rebound Chest is clear  Data Reviewed: reviewed   CBC    Component Value Date/Time   WBC 6.1 09/25/2023 0859   RBC 3.19 (L) 09/25/2023 0859   HGB 8.6 (L) 09/25/2023 0859   HGB 10.9 (L) 03/13/2023 1511   HCT 27.9 (L) 09/25/2023 0859   HCT 35.1 03/13/2023 1511   PLT 242 09/25/2023 0859   PLT 209 03/13/2023 1511   MCV 87.5 09/25/2023 0859   MCV 90 03/13/2023 1511   MCH 27.0 09/25/2023 0859   MCHC 30.8 09/25/2023 0859   RDW 15.6 (H) 09/25/2023 0859   RDW 15.1 03/13/2023 1511   LYMPHSABS 1.0 09/23/2023 0451   LYMPHSABS 1.2 03/13/2023 1511   MONOABS 0.7 09/23/2023 0451   EOSABS 0.2 09/23/2023 0451   EOSABS 0.2 03/13/2023 1511   BASOSABS 0.0 09/23/2023 0451   BASOSABS 0.0 03/13/2023 1511      Latest Ref Rng & Units 09/25/2023    8:59 AM 09/24/2023   10:24 AM 09/23/2023    4:51 AM  CMP  Glucose 70 - 99 mg/dL 562  130  99   BUN 6 - 20 mg/dL  14  16  20    Creatinine 0.44 - 1.00 mg/dL 1.61  0.96  0.45   Sodium 135 - 145 mmol/L 136  137  138   Potassium 3.5 - 5.1 mmol/L 2.9  3.2  3.3   Chloride 98 - 111 mmol/L 100  100  96   CO2 22 - 32 mmol/L 27  26  28    Calcium 8.9 - 10.3 mg/dL 8.5  8.8  8.7   Total Protein 6.5 - 8.1 g/dL 6.4  6.6  6.1   Total Bilirubin 0.3 - 1.2 mg/dL 0.4  0.5  0.3   Alkaline Phos 38 - 126 U/L 88  102  82   AST 15 - 41 U/L 11  13  15    ALT 0 - 44 U/L 15  17  15       Scheduled Meds:  feeding supplement  1 Container Oral TID BM   insulin aspart  0-6 Units Subcutaneous TID WC   ketorolac  15 mg Intravenous Q6H   lamoTRIgine  150 mg Oral QHS   levothyroxine  150 mcg Oral Q0600   modafinil  300 mg Oral Daily   sertraline  200 mg Oral Daily   Continuous Infusions:  piperacillin-tazobactam (ZOSYN)  IV 3.375 g (09/25/23 1211)    Time  22  Rhetta Mura, MD  [facility]

## 2023-09-25 NOTE — Progress Notes (Signed)
Trauma/Critical Care Follow Up Note  Subjective:    Overnight Issues:   Objective:  Vital signs for last 24 hours: Temp:  [98.2 F (36.8 C)-98.6 F (37 C)] 98.2 F (36.8 C) (10/28 0845) Pulse Rate:  [66-70] 66 (10/28 0845) Resp:  [17-19] 17 (10/28 0845) BP: (106-128)/(53-62) 106/62 (10/28 0845) SpO2:  [92 %-98 %] 92 % (10/28 0845) Weight:  [189.1 kg] 189.1 kg (10/28 0312)  Hemodynamic parameters for last 24 hours:    Intake/Output from previous day: 10/27 0701 - 10/28 0700 In: 360 [P.O.:360] Out: 500 [Urine:500]  Intake/Output this shift: No intake/output data recorded.  Vent settings for last 24 hours:    Physical Exam:  Gen: comfortable, no distress Neuro: follows commands, alert, communicative HEENT: PERRL Neck: supple CV: RRR Pulm: unlabored breathing on RA Abd: soft, NT    GU: urine clear and yellow, +spontaneous voids Extr: wwp, no edema  Results for orders placed or performed during the hospital encounter of 09/21/23 (from the past 24 hour(s))  Comprehensive metabolic panel     Status: Abnormal   Collection Time: 09/24/23 10:24 AM  Result Value Ref Range   Sodium 137 135 - 145 mmol/L   Potassium 3.2 (L) 3.5 - 5.1 mmol/L   Chloride 100 98 - 111 mmol/L   CO2 26 22 - 32 mmol/L   Glucose, Bld 102 (H) 70 - 99 mg/dL   BUN 16 6 - 20 mg/dL   Creatinine, Ser 4.09 (H) 0.44 - 1.00 mg/dL   Calcium 8.8 (L) 8.9 - 10.3 mg/dL   Total Protein 6.6 6.5 - 8.1 g/dL   Albumin 2.9 (L) 3.5 - 5.0 g/dL   AST 13 (L) 15 - 41 U/L   ALT 17 0 - 44 U/L   Alkaline Phosphatase 102 38 - 126 U/L   Total Bilirubin 0.5 0.3 - 1.2 mg/dL   GFR, Estimated 49 (L) >60 mL/min   Anion gap 11 5 - 15  Glucose, capillary     Status: Abnormal   Collection Time: 09/24/23 12:48 PM  Result Value Ref Range   Glucose-Capillary 103 (H) 70 - 99 mg/dL  Glucose, capillary     Status: Abnormal   Collection Time: 09/24/23  3:51 PM  Result Value Ref Range   Glucose-Capillary 189 (H) 70 - 99 mg/dL   Glucose, capillary     Status: Abnormal   Collection Time: 09/24/23  6:30 PM  Result Value Ref Range   Glucose-Capillary 126 (H) 70 - 99 mg/dL  Glucose, capillary     Status: Abnormal   Collection Time: 09/24/23 10:59 PM  Result Value Ref Range   Glucose-Capillary 142 (H) 70 - 99 mg/dL  Glucose, capillary     Status: Abnormal   Collection Time: 09/25/23  7:51 AM  Result Value Ref Range   Glucose-Capillary 114 (H) 70 - 99 mg/dL    Assessment & Plan:  Present on Admission:  Diverticulitis of large intestine with abscess  Chronic diastolic CHF (congestive heart failure) (HCC)  Hypothyroidism  OSA (obstructive sleep apnea)  Type 2 diabetes mellitus with obesity (HCC)  Hypertension associated with diabetes (HCC)  Hyperlipidemia associated with type 2 diabetes mellitus (HCC)    LOS: 4 days   Additional comments:I reviewed the patient's new clinical lab test results.   and I reviewed the patients new imaging test results.    HD#5 - Diverticulitis with abscess - CT 10/24 shows an abscess ~5.5 cm arising from the sigmoid colon, abutting the bladder. IR reviewed and  it is not amenable to drainage. Plan repeat CT 10/29 - plan to continue IV abx - okay to advance to FLD   AKI - IVF per primary OSA on CPAP Depression Hypothyroidism  DM2 Severe morbid obesity - BMI 78, takes ozempic last dose Friday 10/18   Patient comfortable, mild pain.  Lots of questions again today regarding plans for management.  Discussed with patient and all were answered to her satisfaction this morning.   Allow hard candy, gummy bears for comfort.  Diamantina Monks, MD Trauma & General Surgery Please use [email or web address] to contact on call provider  09/25/2023  *Care during the described time interval was provided by me. I have reviewed this patient's available data, including medical history, events of note, physical examination and test results as part of my evaluation.

## 2023-09-26 ENCOUNTER — Other Ambulatory Visit: Payer: Self-pay

## 2023-09-26 ENCOUNTER — Inpatient Hospital Stay (HOSPITAL_COMMUNITY): Payer: BC Managed Care – PPO

## 2023-09-26 DIAGNOSIS — K5720 Diverticulitis of large intestine with perforation and abscess without bleeding: Secondary | ICD-10-CM | POA: Diagnosis not present

## 2023-09-26 LAB — GLUCOSE, CAPILLARY
Glucose-Capillary: 129 mg/dL — ABNORMAL HIGH (ref 70–99)
Glucose-Capillary: 171 mg/dL — ABNORMAL HIGH (ref 70–99)
Glucose-Capillary: 152 mg/dL — ABNORMAL HIGH (ref 70–99)
Glucose-Capillary: 126 mg/dL — ABNORMAL HIGH (ref 70–99)

## 2023-09-26 LAB — CBC
HCT: 28.7 % — ABNORMAL LOW (ref 36.0–46.0)
Hemoglobin: 8.7 g/dL — ABNORMAL LOW (ref 12.0–15.0)
MCH: 26.4 pg (ref 26.0–34.0)
MCHC: 30.3 g/dL (ref 30.0–36.0)
MCV: 87.2 fL (ref 80.0–100.0)
Platelets: 254 10*3/uL (ref 150–400)
RBC: 3.29 MIL/uL — ABNORMAL LOW (ref 3.87–5.11)
RDW: 15.5 % (ref 11.5–15.5)
WBC: 6.2 10*3/uL (ref 4.0–10.5)
nRBC: 0 % (ref 0.0–0.2)

## 2023-09-26 LAB — BASIC METABOLIC PANEL
Anion gap: 10 (ref 5–15)
BUN: 14 mg/dL (ref 6–20)
CO2: 26 mmol/L (ref 22–32)
Calcium: 8.9 mg/dL (ref 8.9–10.3)
Chloride: 100 mmol/L (ref 98–111)
Creatinine, Ser: 1.37 mg/dL — ABNORMAL HIGH (ref 0.44–1.00)
GFR, Estimated: 49 mL/min — ABNORMAL LOW (ref >60)
Glucose, Bld: 170 mg/dL — ABNORMAL HIGH (ref 70–99)
Potassium: 3.1 mmol/L — ABNORMAL LOW (ref 3.5–5.1)
Sodium: 136 mmol/L (ref 135–145)

## 2023-09-26 MED ORDER — IOHEXOL 9 MG/ML PO SOLN
500.0000 mL | ORAL | Status: AC
  Administered 2023-09-26 (×2): 500 mL via ORAL

## 2023-09-26 MED ORDER — ENOXAPARIN SODIUM 100 MG/ML IJ SOSY
100.0000 mg | PREFILLED_SYRINGE | Freq: Every day | INTRAMUSCULAR | Status: DC
  Administered 2023-09-26 – 2023-09-28 (×3): 100 mg via SUBCUTANEOUS
  Filled 2023-09-26 (×3): qty 1

## 2023-09-26 MED ORDER — KETOROLAC TROMETHAMINE 15 MG/ML IJ SOLN: 15.0000 mg | Freq: Four times a day (QID) | INTRAMUSCULAR | Status: DC | PRN

## 2023-09-26 MED ORDER — IOHEXOL 350 MG/ML SOLN
100.0000 mL | Freq: Once | INTRAVENOUS | Status: AC | PRN
  Administered 2023-09-26: 100 mL via INTRAVENOUS

## 2023-09-26 MED ORDER — POTASSIUM CHLORIDE CRYS ER 20 MEQ PO TBCR
40.0000 meq | EXTENDED_RELEASE_TABLET | Freq: Every day | ORAL | Status: DC
  Administered 2023-09-26 – 2023-09-28 (×3): 40 meq via ORAL
  Filled 2023-09-26 (×3): qty 2

## 2023-09-26 MED ORDER — OXYCODONE-ACETAMINOPHEN 5-325 MG PO TABS
1.0000 | ORAL_TABLET | ORAL | Status: DC | PRN
  Administered 2023-09-26 – 2023-09-27 (×2): 2 via ORAL
  Filled 2023-09-26 (×2): qty 2

## 2023-09-26 NOTE — Progress Notes (Signed)
Trauma/Critical Care Follow Up Note  Subjective:    Overnight Issues: overall feels better. Pain improved - still has some abdominal pain/pelvic pain during BMs. Reports non-bloody stools. No increased abdominal discomfort on FLD. Denies urinary sxs.  Objective:  Vital signs for last 24 hours: Temp:  [97.3 F (36.3 C)-98.3 F (36.8 C)] 98.3 F (36.8 C) (10/29 0827) Pulse Rate:  [62-70] 70 (10/29 0827) Resp:  [18] 18 (10/29 0827) BP: (108-139)/(50-73) 116/50 (10/29 0827) SpO2:  [92 %-99 %] 92 % (10/29 0827) Weight:  [190.6 kg] 190.6 kg (10/29 0500)  Hemodynamic parameters for last 24 hours:    Intake/Output from previous day: 10/28 0701 - 10/29 0700 In: 1740 [P.O.:1740] Out: -   Intake/Output this shift: No intake/output data recorded.  Vent settings for last 24 hours:    Physical Exam:  Gen: comfortable, no distress Neuro: follows commands, alert, communicative HEENT: PERRL Neck: supple CV: RRR Pulm: unlabored breathing on RA Abd: soft, NT    GU: urine clear and yellow, +spontaneous voids Extr: wwp, no edema  Results for orders placed or performed during the hospital encounter of 09/21/23 (from the past 24 hour(s))  Glucose, capillary     Status: Abnormal   Collection Time: 09/25/23  5:10 PM  Result Value Ref Range   Glucose-Capillary 111 (H) 70 - 99 mg/dL  Glucose, capillary     Status: Abnormal   Collection Time: 09/25/23  8:42 PM  Result Value Ref Range   Glucose-Capillary 149 (H) 70 - 99 mg/dL  CBC     Status: Abnormal   Collection Time: 09/26/23  7:04 AM  Result Value Ref Range   WBC 6.2 4.0 - 10.5 K/uL   RBC 3.29 (L) 3.87 - 5.11 MIL/uL   Hemoglobin 8.7 (L) 12.0 - 15.0 g/dL   HCT 60.4 (L) 54.0 - 98.1 %   MCV 87.2 80.0 - 100.0 fL   MCH 26.4 26.0 - 34.0 pg   MCHC 30.3 30.0 - 36.0 g/dL   RDW 19.1 47.8 - 29.5 %   Platelets 254 150 - 400 K/uL   nRBC 0.0 0.0 - 0.2 %  Glucose, capillary     Status: Abnormal   Collection Time: 09/26/23  8:26 AM   Result Value Ref Range   Glucose-Capillary 129 (H) 70 - 99 mg/dL  Basic metabolic panel     Status: Abnormal   Collection Time: 09/26/23 12:12 PM  Result Value Ref Range   Sodium 136 135 - 145 mmol/L   Potassium 3.1 (L) 3.5 - 5.1 mmol/L   Chloride 100 98 - 111 mmol/L   CO2 26 22 - 32 mmol/L   Glucose, Bld 170 (H) 70 - 99 mg/dL   BUN 14 6 - 20 mg/dL   Creatinine, Ser 6.21 (H) 0.44 - 1.00 mg/dL   Calcium 8.9 8.9 - 30.8 mg/dL   GFR, Estimated 49 (L) >60 mL/min   Anion gap 10 5 - 15  Glucose, capillary     Status: Abnormal   Collection Time: 09/26/23 12:13 PM  Result Value Ref Range   Glucose-Capillary 171 (H) 70 - 99 mg/dL    Assessment & Plan:  Present on Admission:  Diverticulitis of large intestine with abscess  Chronic diastolic CHF (congestive heart failure) (HCC)  Hypothyroidism  OSA (obstructive sleep apnea)  Type 2 diabetes mellitus with obesity (HCC)  Hypertension associated with diabetes (HCC)  Hyperlipidemia associated with type 2 diabetes mellitus (HCC)    LOS: 5 days   Additional comments:I reviewed  the patient's new clinical lab test results.   and I reviewed the patients new imaging test results.    HD#6 - Diverticulitis with abscess - CT 10/24 shows an abscess ~5.5 cm arising from the sigmoid colon, abutting the bladder. IR reviewed and it is not amenable to drainage.  - Repeat CT 10/29 with enalrging phlegmon 6.4 x 4.6 x 4.8 cm ,centrally located and not amenable to percutaneous drainage. - plan to continue IV abx -  FLD - there is no emergent role for surgery today. She is clinically improving on IV abx. Will discuss  with attending -- I am concerned that she would fail outpatient management with PO abx given persistent abscess not amenable to drainage. Surgical management would be difficult given her body habitus. Will discuss with colorectal colleagues and will also have her images pushed to [facility] to review with colleagues at tertiary  center as well.   AKI - IVF per primary OSA on CPAP Depression Hypothyroidism  DM2 Severe morbid obesity - BMI 78, takes ozempic last dose Friday 10/18   Hosie Spangle, PA-C Trauma & General Surgery Please use [email or web address] to contact on call provider  09/26/2023  *Care during the described time interval was provided by me. I have reviewed this patient's available data, including medical history, events of note, physical examination and test results as part of my evaluation.

## 2023-09-26 NOTE — Plan of Care (Signed)
  Problem: Education: Goal: Knowledge of General Education information will improve Description Including pain rating scale, medication(s)/side effects and non-pharmacologic comfort measures Outcome: Progressing   Problem: Health Behavior/Discharge Planning: Goal: Ability to manage health-related needs will improve Outcome: Progressing   

## 2023-09-26 NOTE — Progress Notes (Signed)
   09/26/23 1959  BiPAP/CPAP/SIPAP  $ Non-Invasive Home Ventilator  Subsequent  Patient Home Equipment Yes   Pt self sufficient to place CPAP on herself.

## 2023-09-26 NOTE — Progress Notes (Deleted)
NT went in to get pt Vital signs and he refused, he stated that he is leaving today and he did not need them.

## 2023-09-26 NOTE — Progress Notes (Signed)
HOSPITALIST ROUNDING NOTE Alexandra Beck WUX:[id number]  DOB: 06/15/1981  DOA: 09/21/2023  PCP: Corwin Levins, MD  09/26/2023,5:13 PM   LOS: 5 days      Code Status:  full  From:   home    Current Dispo:  unclear    42 year old white female HFpEF--- last EF 55-60% no regional wall motion of the mildly 04/2022 DM TY 2 BMI 78 OSA on CPAP Hypothyroidism Recurrent cellulitis foot with admissions for the same Currently follows with both healthy weight and wellness clinic as well as with psychiatry and has loss about 25 pounds since initiation on Ozempic  She was seen in the office on 10/18 with abd pain and discomfort --- CT 10/21 showed diverticulosis air-containing fluid collection CT repeated here with contrast showed 5.6 X6.9X 3.2 cm abscess  General Surgery, IR consulted  10/29 CT sigmoid diverticulitis with phlegmon interposed between sigmoid and bladder dome compatible with abscess not amenable to percutaneous drainage-current size 6.4 X4.6X 4.8  Plan Intra-abdominal abscess 6 x 7 x 3 cm---not Candidate for IR placement of drain given the central location continues on IV Zosyn--- I am placing a PICC line today Switch IV pain control to oral Norco keep IV Toradol in place and we will await further discussion with general surgery and patient as well as tertiary care center regarding best options for management 1 option might be to discharge home with PICC line and get outpatient evaluation in the next several days at tertiary care-General Surgery is debating this with their tertiary care colleagues Appreciate their care  HFpEF Hold losartan 100 torsemide 80 twice daily Aldactone 25  Hold IV fluid watch for hypotension  AKI Improved--hold IVF--force oral fluid tolerating fairly  DM TY 2 ?  Diet-controlled versus not on meds Monitor trends every 4 if above 180 but so far not the case  Hypo-thyroid Continue Synthroid 150  Depression Continue Zoloft 200 daily with a sip of water,  Lamictal 150 at bedtime Provigil 300 qd  OSA on CPAP  DVT prophylaxis: SCD   Status is: Inpatient Remains inpatient appropriate because:   Unclear disposition      Subjective:  Pain is about the same no fever no chills no nausea I have asked her to trial p.o. tramadol and reserve IV meds for severe pain She is tolerating gummy bears   Objective + exam Vitals:   09/26/23 0324 09/26/23 0500 09/26/23 0827 09/26/23 1612  BP: 131/68  (!) 116/50 (!) 141/66  Pulse: 69  70 66  Resp:   18 18  Temp: 98.3 F (36.8 C)  98.3 F (36.8 C) 97.8 F (36.6 C)  TempSrc: Oral     SpO2: 97%  92% 99%  Weight:  (!) 190.6 kg    Height:       Filed Weights   09/24/23 0500 09/25/23 0312 09/26/23 0500  Weight: (!) 189.2 kg (!) 189.1 kg (!) 190.6 kg    Examination:  EOMI NCAT thick neck Mallampati 4 Tender in central epigastrium some---habitus makes exam difficult No rebound Chest is clear  Data Reviewed: reviewed   CBC    Component Value Date/Time   WBC 6.2 09/26/2023 0704   RBC 3.29 (L) 09/26/2023 0704   HGB 8.7 (L) 09/26/2023 0704   HGB 10.9 (L) 03/13/2023 1511   HCT 28.7 (L) 09/26/2023 0704   HCT 35.1 03/13/2023 1511   PLT 254 09/26/2023 0704   PLT 209 03/13/2023 1511   MCV 87.2 09/26/2023 0704   MCV  90 03/13/2023 1511   MCH 26.4 09/26/2023 0704   MCHC 30.3 09/26/2023 0704   RDW 15.5 09/26/2023 0704   RDW 15.1 03/13/2023 1511   LYMPHSABS 1.0 09/23/2023 0451   LYMPHSABS 1.2 03/13/2023 1511   MONOABS 0.7 09/23/2023 0451   EOSABS 0.2 09/23/2023 0451   EOSABS 0.2 03/13/2023 1511   BASOSABS 0.0 09/23/2023 0451   BASOSABS 0.0 03/13/2023 1511      Latest Ref Rng & Units 09/26/2023   12:12 PM 09/25/2023    8:59 AM 09/24/2023   10:24 AM  CMP  Glucose 70 - 99 mg/dL 644  034  742   BUN 6 - 20 mg/dL 14  14  16    Creatinine 0.44 - 1.00 mg/dL 5.95  6.38  7.56   Sodium 135 - 145 mmol/L 136  136  137   Potassium 3.5 - 5.1 mmol/L 3.1  2.9  3.2   Chloride 98 - 111 mmol/L  100  100  100   CO2 22 - 32 mmol/L 26  27  26    Calcium 8.9 - 10.3 mg/dL 8.9  8.5  8.8   Total Protein 6.5 - 8.1 g/dL  6.4  6.6   Total Bilirubin 0.3 - 1.2 mg/dL  0.4  0.5   Alkaline Phos 38 - 126 U/L  88  102   AST 15 - 41 U/L  11  13   ALT 0 - 44 U/L  15  17      Scheduled Meds:  enoxaparin (LOVENOX) injection  100 mg Subcutaneous Daily   feeding supplement  1 Container Oral TID BM   insulin aspart  0-6 Units Subcutaneous TID WC   ketorolac  15 mg Intravenous Q6H   lamoTRIgine  150 mg Oral QHS   levothyroxine  150 mcg Oral Q0600   modafinil  300 mg Oral Daily   potassium chloride  40 mEq Oral Daily   sertraline  200 mg Oral Daily   Continuous Infusions:  piperacillin-tazobactam (ZOSYN)  IV 3.375 g (09/26/23 1223)    Time  45  Rhetta Mura, MD  [facility]

## 2023-09-27 ENCOUNTER — Ambulatory Visit: Payer: BC Managed Care – PPO | Admitting: Psychology

## 2023-09-27 DIAGNOSIS — K5720 Diverticulitis of large intestine with perforation and abscess without bleeding: Secondary | ICD-10-CM | POA: Diagnosis not present

## 2023-09-27 LAB — CBC
HCT: 28.2 % — ABNORMAL LOW (ref 36.0–46.0)
Hemoglobin: 8.5 g/dL — ABNORMAL LOW (ref 12.0–15.0)
MCH: 26.2 pg (ref 26.0–34.0)
MCHC: 30.1 g/dL (ref 30.0–36.0)
MCV: 87 fL (ref 80.0–100.0)
Platelets: 243 10*3/uL (ref 150–400)
RBC: 3.24 MIL/uL — ABNORMAL LOW (ref 3.87–5.11)
RDW: 15.4 % (ref 11.5–15.5)
WBC: 5.2 10*3/uL (ref 4.0–10.5)
nRBC: 0 % (ref 0.0–0.2)

## 2023-09-27 LAB — COMPREHENSIVE METABOLIC PANEL
ALT: 15 U/L (ref 0–44)
AST: 12 U/L — ABNORMAL LOW (ref 15–41)
Albumin: 2.5 g/dL — ABNORMAL LOW (ref 3.5–5.0)
Alkaline Phosphatase: 79 U/L (ref 38–126)
Anion gap: 13 (ref 5–15)
BUN: 15 mg/dL (ref 6–20)
CO2: 25 mmol/L (ref 22–32)
Calcium: 9.2 mg/dL (ref 8.9–10.3)
Chloride: 101 mmol/L (ref 98–111)
Creatinine, Ser: 1.16 mg/dL — ABNORMAL HIGH (ref 0.44–1.00)
GFR, Estimated: 60 mL/min — ABNORMAL LOW (ref >60)
Glucose, Bld: 141 mg/dL — ABNORMAL HIGH (ref 70–99)
Potassium: 3.4 mmol/L — ABNORMAL LOW (ref 3.5–5.1)
Sodium: 139 mmol/L (ref 135–145)
Total Bilirubin: 0.3 mg/dL (ref 0.3–1.2)
Total Protein: 5.9 g/dL — ABNORMAL LOW (ref 6.5–8.1)

## 2023-09-27 LAB — GLUCOSE, CAPILLARY
Glucose-Capillary: 130 mg/dL — ABNORMAL HIGH (ref 70–99)
Glucose-Capillary: 94 mg/dL (ref 70–99)
Glucose-Capillary: 121 mg/dL — ABNORMAL HIGH (ref 70–99)
Glucose-Capillary: 131 mg/dL — ABNORMAL HIGH (ref 70–99)

## 2023-09-27 LAB — CULTURE, BLOOD (ROUTINE X 2)
Culture: NO GROWTH
Culture: NO GROWTH
Special Requests: ADEQUATE

## 2023-09-27 MED ORDER — CIPROFLOXACIN HCL 500 MG PO TABS: 750.0000 mg | ORAL_TABLET | Freq: Two times a day (BID) | ORAL | Status: DC

## 2023-09-27 MED ORDER — PIPERACILLIN-TAZOBACTAM 3.375 G IVPB
3.3750 g | Freq: Three times a day (TID) | INTRAVENOUS | Status: DC
  Administered 2023-09-27 – 2023-09-28 (×2): 3.375 g via INTRAVENOUS
  Filled 2023-09-27 (×2): qty 50

## 2023-09-27 MED ORDER — METRONIDAZOLE 500 MG PO TABS: 500.0000 mg | ORAL_TABLET | Freq: Two times a day (BID) | ORAL | Status: DC

## 2023-09-27 NOTE — Progress Notes (Signed)
In to perform PICC insertion.Procedure explained to pt and was agreeable but then stated she has went to IR for past for her PICC line. Checked in [place] and note states pt goes to IR due to PICC insertion at bedside could not thread centrally. Dr. Renford Dills notified by Wray Kearns RN of this.

## 2023-09-27 NOTE — Progress Notes (Addendum)
General Surgery Follow Up Note  Subjective:    Overnight Issues:   Objective:  Vital signs for last 24 hours: Temp:  [97.8 F (36.6 C)-97.9 F (36.6 C)] 97.8 F (36.6 C) (10/30 1953) Pulse Rate:  [62-71] 71 (10/30 1953) Resp:  [16-18] 17 (10/30 1953) BP: (109-137)/(54-76) 136/64 (10/30 1953) SpO2:  [96 %-100 %] 98 % (10/30 1953)  Hemodynamic parameters for last 24 hours:    Intake/Output from previous day: 10/29 0701 - 10/30 0700 In: 940 [P.O.:240; IV Piggyback:700] Out: -   Intake/Output this shift: No intake/output data recorded.  Vent settings for last 24 hours:    Physical Exam:  Gen: comfortable, no distress Neuro: follows commands, alert, communicative HEENT: PERRL Neck: supple CV: RRR Pulm: unlabored breathing on RA Abd: soft, NT    GU: urine clear and yellow, +spontaneous void Extr: wwp, no edema  Results for orders placed or performed during the hospital encounter of 09/21/23 (from the past 24 hour(s))  Glucose, capillary     Status: Abnormal   Collection Time: 09/27/23  8:10 AM  Result Value Ref Range   Glucose-Capillary 130 (H) 70 - 99 mg/dL   Comment 1 Notify RN   CBC     Status: Abnormal   Collection Time: 09/27/23  8:22 AM  Result Value Ref Range   WBC 5.2 4.0 - 10.5 K/uL   RBC 3.24 (L) 3.87 - 5.11 MIL/uL   Hemoglobin 8.5 (L) 12.0 - 15.0 g/dL   HCT 40.9 (L) 81.1 - 91.4 %   MCV 87.0 80.0 - 100.0 fL   MCH 26.2 26.0 - 34.0 pg   MCHC 30.1 30.0 - 36.0 g/dL   RDW 78.2 95.6 - 21.3 %   Platelets 243 150 - 400 K/uL   nRBC 0.0 0.0 - 0.2 %  Comprehensive metabolic panel     Status: Abnormal   Collection Time: 09/27/23  8:22 AM  Result Value Ref Range   Sodium 139 135 - 145 mmol/L   Potassium 3.4 (L) 3.5 - 5.1 mmol/L   Chloride 101 98 - 111 mmol/L   CO2 25 22 - 32 mmol/L   Glucose, Bld 141 (H) 70 - 99 mg/dL   BUN 15 6 - 20 mg/dL   Creatinine, Ser 0.86 (H) 0.44 - 1.00 mg/dL   Calcium 9.2 8.9 - 57.8 mg/dL   Total Protein 5.9 (L) 6.5 - 8.1  g/dL   Albumin 2.5 (L) 3.5 - 5.0 g/dL   AST 12 (L) 15 - 41 U/L   ALT 15 0 - 44 U/L   Alkaline Phosphatase 79 38 - 126 U/L   Total Bilirubin 0.3 0.3 - 1.2 mg/dL   GFR, Estimated 60 (L) >60 mL/min   Anion gap 13 5 - 15  Glucose, capillary     Status: None   Collection Time: 09/27/23 11:56 AM  Result Value Ref Range   Glucose-Capillary 94 70 - 99 mg/dL   Comment 1 Notify RN   Glucose, capillary     Status: Abnormal   Collection Time: 09/27/23  5:04 PM  Result Value Ref Range   Glucose-Capillary 121 (H) 70 - 99 mg/dL   Comment 1 Notify RN   Glucose, capillary     Status: Abnormal   Collection Time: 09/27/23  7:56 PM  Result Value Ref Range   Glucose-Capillary 131 (H) 70 - 99 mg/dL   *Note: Due to a large number of results and/or encounters for the requested time period, some results have not been  displayed. A complete set of results can be found in Results Review.    Assessment & Plan:  Present on Admission:  Diverticulitis of large intestine with abscess  Chronic diastolic CHF (congestive heart failure) (HCC)  Hypothyroidism  OSA (obstructive sleep apnea)  Type 2 diabetes mellitus with obesity (HCC)  Hypertension associated with diabetes (HCC)  Hyperlipidemia associated with type 2 diabetes mellitus (HCC)    LOS: 6 days   Additional comments:I reviewed the patient's new clinical lab test results.   and I reviewed the patients new imaging test results.    HD#7 - Diverticulitis with abscess - CT 10/24 shows an abscess ~5.5 cm arising from the sigmoid colon, abutting the bladder. IR reviewed and it is not amenable to drainage.  - Repeat CT 10/29 with enalrging phlegmon 6.4 x 4.6 x 4.8 cm ,centrally located and not amenable to percutaneous drainage. - plan to continue IV abx as o/p, PICC line recommended. D/w ID after auto-consult for o/p IV abx. ID recommending PO abx at discharge, but for now will continue IV abx and d/w receiving institution regarding plan preference re: abx  given high surgical risk if failed nonop management - short interval CT f/u at 1w from previous CT (11/5) -  soft diet - there is no emergent role for surgery. She is clinically improving on IV abx. I remain concerned that she would fail outpatient management with PO abx given persistent abscess not amenable to drainage and have discussed my concerns with ID. I have discussed this case with my colorectal colleagues who were in agreement with this plan.    AKI - IVF per primary OSA on CPAP Depression Hypothyroidism  DM2 Severe morbid obesity - BMI 78, takes ozempic last dose Friday 10/18  Diamantina Monks, MD Trauma & General Surgery Please use [email or web address] to contact on call provider  09/27/2023  *Care during the described time interval was provided by me. I have reviewed this patient's available data, including medical history, events of note, physical examination and test results as part of my evaluation.

## 2023-09-27 NOTE — TOC Initial Note (Addendum)
Transition of Care [facility]) - Initial/Assessment Note    Patient Details  Name: Alexandra Beck MRN: [id number] Date of Birth: [birth year]  [facility] CM/SW Contact:    Kingsley Plan, RN Phone Number: 09/27/2023, 12:33 PM  Clinical Narrative:                  Spoke with patient at bedside. Patient from home with spouse.   Plan to go home with IX ABX . Patient would like to talk to [place] with [facility] regarding same.   Secure chatted Marisue Ivan with [facility].   Explained infusion company with be [employer]. Pam a nurse with [employer] will come to room prior to discharge and teach her and her support person how to adminster IV ABX at home. She will have a HHRN however the [facility] will not be present every time a dose is due .   Patient voiced understanding.   Kandee Keen with [facility] accepted referral for [facility] .   Pam with [employer] looking.   IV Team just arrived to patient's room to place PICC.   Will need OPAT script and HHRN order.  Expected Discharge Plan: Home w Home Health Services Barriers to Discharge: Continued Medical Work up   Patient Goals and CMS Choice Patient states their goals for this hospitalization and ongoing recovery are:: to return to home CMS Medicare.gov Compare Post Acute Care list provided to:: Patient Choice offered to / list presented to : Patient  ownership interest in [facility].provided to:: Patient    Expected Discharge Plan and Services   Discharge Planning Services: CM Consult Post Acute Care Choice: Home Health Living arrangements for the past 2 months: Single Family Home                 DME Arranged: N/A         HH Arranged: RN HH Agency: [facility] Date [facility] Agency Contacted: 09/27/23 Time HH Agency Contacted: 1232 Representative spoke with at [facility] Agency: Kandee Keen  Prior Living Arrangements/Services Living arrangements for the past 2 months: Single Family Home Lives with:: Spouse Patient language and need for  interpreter reviewed:: Yes Do you feel safe going back to the place where you live?: Yes      Need for Family Participation in Patient Care: Yes (Comment) Care giver support system in place?: Yes (comment)   Criminal Activity/Legal Involvement Pertinent to Current Situation/Hospitalization: No - Comment as needed  Activities of Daily Living   ADL Screening (condition at time of admission) Independently performs ADLs?: Yes (appropriate for developmental age) Is the patient deaf or have difficulty hearing?: No Does the patient have difficulty seeing, even when wearing glasses/contacts?: No Does the patient have difficulty concentrating, remembering, or making decisions?: No  Permission Sought/Granted   Permission granted to share information with : Yes, Verbal Permission Granted     Permission granted to share info w AGENCY: home health        Emotional Assessment Appearance:: Appears stated age Attitude/Demeanor/Rapport: Engaged Affect (typically observed): Accepting Orientation: : Oriented to Self, Oriented to Place, Oriented to  Time, Oriented to Situation Alcohol / Substance Use: Not Applicable Psych Involvement: No (comment)  Admission diagnosis:  Diverticulitis [K57.92] Generalized abdominal pain [R10.84] Diverticulitis of large intestine with abscess [K57.20] Patient Active Problem List   Diagnosis Date Noted   Diverticulitis of large intestine with abscess 09/21/2023   Diarrhea 09/24/2022   Abdominal pain 09/23/2022   Encounter for well adult exam with abnormal  findings 06/17/2022   Bilateral hearing loss 06/17/2022   Anemia 06/17/2022   Well woman exam 06/17/2022   Fever 05/14/2022   Hypokalemia 05/14/2022   Lightheadedness 05/14/2022   Shortness of breath 05/14/2022   Pneumonia 05/13/2022   Eczema, dyshidrotic 09/19/2021   Chronic pain 09/19/2021   Cellulitis and abscess of neck 05/14/2021   Diabetes mellitus (HCC) 10/19/2020   Psoriasis 12/26/2019    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   Hypersomnolence 05/11/2018   Vitamin D deficiency 05/02/2018   Cellulitis of left leg 04/17/2017   Cellulitis of right leg 01/11/2017   PVC's (premature ventricular contractions) 12/02/2016   Chronic diastolic CHF (congestive heart failure) (HCC)    Hyperlipidemia associated with type 2 diabetes mellitus (HCC)    Type 2 diabetes mellitus with obesity (HCC)    Hypothyroidism    GERD (gastroesophageal reflux disease)    Depression    Venous insufficiency of leg    Recurrent cellulitis of lower leg 12/25/2012   OSA (obstructive sleep apnea) 12/25/2012   Obesity hypoventilation syndrome (HCC) 12/25/2012   Morbid obesity (HCC) 12/24/2012   Hypertension associated with diabetes (HCC) 06/26/2007   PCP:  Corwin Levins, MD Pharmacy:   [facility] [address] [place] [address] Phone: [phone] Fax: [phone]     Social Determinants of Health (SDOH) Social History: SDOH Screenings   Food Insecurity: No Food Insecurity (09/22/2023)  Housing: Low Risk  (09/22/2023)  Transportation Needs: No Transportation Needs (09/22/2023)  Utilities: Not At Risk (09/22/2023)  Depression (PHQ2-9): Medium Risk (06/30/2023)  Tobacco Use: Medium Risk (09/22/2023)   SDOH Interventions:     Readmission Risk Interventions     No data to display

## 2023-09-27 NOTE — Progress Notes (Signed)
   09/27/23 1945  BiPAP/CPAP/SIPAP  $ Non-Invasive Home Ventilator  Subsequent  Patient Home Equipment Yes

## 2023-09-27 NOTE — Plan of Care (Signed)
  Problem: Education: Goal: Knowledge of General Education information will improve Description: Including pain rating scale, medication(s)/side effects and non-pharmacologic comfort measures Outcome: Progressing   Problem: Pain Management: Goal: General experience of comfort will improve Outcome: Progressing   Problem: Skin Integrity: Goal: Risk for impaired skin integrity will decrease Outcome: Progressing

## 2023-09-27 NOTE — Progress Notes (Signed)
PROGRESS NOTE  Alexandra Beck  WUJ:[id number] DOB: 11/20/1981 DOA: 09/21/2023 PCP: Corwin Levins, MD   Brief Narrative: Patient is a 42 year old female with history of diabetes type 2, morbid obesity with BMI of 79, hypothyroidism, recurrent cellulitis of foot who initially presented with abdominal pain.  CT imaging on presentation showed 5.5 cm abscess arising from the sigmoid colon.  General surgery consulted.  IR also consulted and it was concluded not to be amenable for drainage.  Repeat CT done on 10/29 showed enlarging phlegmon 6.4 x 4.6 x 4.8 cm ,centrally located and not amenable to percutaneous drainage.  Currently on IV antibiotics . general surgery following.  Surgical management is difficult due to her body habitus.  Awaiting recommendation from general surgery.ID also consulted  Assessment & Plan:  Principal Problem:   Diverticulitis of large intestine with abscess Active Problems:   Chronic diastolic CHF (congestive heart failure) (HCC)   Type 2 diabetes mellitus with obesity (HCC)   OSA (obstructive sleep apnea)   Hypertension associated with diabetes (HCC)   Hypothyroidism   Hyperlipidemia associated with type 2 diabetes mellitus (HCC)  Intra-abdominal abscess: Presented with abdominal pain.  Initial CT showed 5.5 cm abscess arising from the sigmoid colon.  General surgery, IR consulted.  IR concluded that it is not amenable for drainage.  Due to her body habitus, she is high risk for surgery. Repeat CT done on 10/29 showed enlarging phlegmon 6.4 x 4.6 x 4.8 cm ,centrally located and not amenable to percutaneous drainage.  Currently on IV antibiotics . general surgery following and will make recommendation.  General surgery also discussing with surgical colleagues at [facility]. Continue pain management, supportive care. We have also consulted ID   Diastolic congestive heart failure: Currently appears euvolemic.  Last echo showed EF of 55 to 60%, no wall motion  abnormality.  Takes losartan, torsemide, Aldactone at home.  AKI: Significantly improved  Hypokalemia: Currently being monitored and supplemented  Type 2 diabetes: Diet controlled.  Monitor blood sugars  Hypothyroidism: Continue Synthyroid  Depression: On Zoloft, Lamictal, Provigil  Normocytic anemia: Current hemoglobin stable in the range of 8  OSA: On CPAP        DVT prophylaxis:SCDs Start: 09/22/23 0017     Code Status: Full Code  Family Communication: None at bedside  Patient status:Inpatient  Patient is from :home  Anticipated discharge NF:AOZH  Estimated DC date:after general surgery clearance,awaiting further plan   Consultants: General surgery  Procedures:None yet  Antimicrobials:  Anti-infectives (From admission, onward)    Start     Dose/Rate Route Frequency Ordered Stop   09/22/23 0400  piperacillin-tazobactam (ZOSYN) IVPB 3.375 g        3.375 g 12.5 mL/hr over 240 Minutes Intravenous Every 8 hours 09/21/23 2327     09/21/23 1715  cefTRIAXone (ROCEPHIN) 2 g in sodium chloride 0.9 % 100 mL IVPB        2 g 200 mL/hr over 30 Minutes Intravenous  Once 09/21/23 1701 09/21/23 2032   09/21/23 1715  metroNIDAZOLE (FLAGYL) IVPB 500 mg        500 mg 100 mL/hr over 60 Minutes Intravenous  Once 09/21/23 1701 09/21/23 2212       Subjective: Patient seen and examined the bedside today.  She was sitting in the chair.  She looked overall comfortable.  Did not complain of any significant nausea, vomiting or abdominal pain.  Tolerating diet. she feels pressure on her left lower quadrant while passing urine  or defecating.Afebrile  Objective: Vitals:   09/26/23 1612 09/26/23 2053 09/27/23 0524 09/27/23 0738  BP: (!) 141/66 133/72 (!) 124/54 (!) 109/57  Pulse: 66 73 62 63  Resp: 18 17 18 16   Temp: 97.8 F (36.6 C) 98 F (36.7 C) 97.8 F (36.6 C) 97.8 F (36.6 C)  TempSrc:  Oral Oral Oral  SpO2: 99% 100% 99% 96%  Weight:      Height:         Intake/Output Summary (Last 24 hours) at 09/27/2023 1315 Last data filed at 09/27/2023 0800 Gross per 24 hour  Intake 952.5 ml  Output --  Net 952.5 ml   Filed Weights   09/24/23 0500 09/25/23 0312 09/26/23 0500  Weight: (!) 189.2 kg (!) 189.1 kg (!) 190.6 kg    Examination:  General exam: Overall comfortable, not in distress, morbidly obese HEENT: PERRL Respiratory system:  no wheezes or crackles  Cardiovascular system: S1 & S2 heard, RRR.  Gastrointestinal system: Abdomen is nondistended, soft and nontender.  Tenderness could not be elicited due to body habitus Central nervous system: Alert and oriented Extremities: No edema, no clubbing ,no cyanosis Skin: No rashes, no ulcers,no icterus     Data Reviewed: I have personally reviewed following labs and imaging studies  CBC: Recent Labs  Lab 09/21/23 1704 09/22/23 0721 09/23/23 0451 09/25/23 0859 09/26/23 0704 09/27/23 0822  WBC 9.4 7.1 7.4 6.1 6.2 5.2  NEUTROABS 8.1*  --  5.4  --   --   --   HGB 10.7* 9.0* 8.5* 8.6* 8.7* 8.5*  HCT 36.1 29.4* 27.7* 27.9* 28.7* 28.2*  MCV 90.0 87.2 86.3 87.5 87.2 87.0  PLT 277 219 218 242 254 243   Basic Metabolic Panel: Recent Labs  Lab 09/23/23 0451 09/24/23 1024 09/25/23 0859 09/26/23 1212 09/27/23 0822  NA 138 137 136 136 139  K 3.3* 3.2* 2.9* 3.1* 3.4*  CL 96* 100 100 100 101  CO2 28 26 27 26 25   GLUCOSE 99 102* 113* 170* 141*  BUN 20 16 14 14 15   CREATININE 1.50* 1.37* 1.40* 1.37* 1.16*  CALCIUM 8.7* 8.8* 8.5* 8.9 9.2     Recent Results (from the past 240 hour(s))  Blood Culture (routine x 2)     Status: None   Collection Time: 09/21/23  5:00 PM   Specimen: BLOOD RIGHT ARM  Result Value Ref Range Status   Specimen Description BLOOD RIGHT ARM  Final   Special Requests   Final    BOTTLES DRAWN AEROBIC ONLY Blood Culture results may not be optimal due to an inadequate volume of blood received in culture bottles   Culture   Final    NO GROWTH 6  DAYS Performed at [facility], [address] [place], [place] [address]    Report Status 09/27/2023 FINAL  Final  Resp panel by RT-PCR (RSV, Flu A&B, Covid) Anterior Nasal Swab     Status: None   Collection Time: 09/21/23  7:59 PM   Specimen: Anterior Nasal Swab  Result Value Ref Range Status   SARS Coronavirus 2 by RT PCR NEGATIVE NEGATIVE Final   Influenza A by PCR NEGATIVE NEGATIVE Final   Influenza B by PCR NEGATIVE NEGATIVE Final    Comment: (NOTE) The Xpert Xpress SARS-CoV-2/FLU/RSV plus assay is intended as an aid in the diagnosis of influenza from Nasopharyngeal swab specimens and should not be used as a sole basis for treatment. Nasal washings and aspirates are unacceptable for Xpert Xpress SARS-CoV-2/FLU/RSV  testing.  Fact Sheet for Patients: BloggerCourse.com  Fact Sheet for Healthcare Providers: SeriousBroker.it  This test is not yet approved or cleared by the [place] FDA and has been authorized for detection and/or diagnosis of SARS-CoV-2 by FDA under an Emergency Use Authorization (EUA). This EUA will remain in effect (meaning this test can be used) for the duration of the COVID-19 declaration under Section 564(b)(1) of the Act, 21 U.S.C. section 360bbb-3(b)(1), unless the authorization is terminated or revoked.     Resp Syncytial Virus by PCR NEGATIVE NEGATIVE Final    Comment: (NOTE) Fact Sheet for Patients: BloggerCourse.com  Fact Sheet for Healthcare Providers: SeriousBroker.it  This test is not yet approved or cleared by the [place] FDA and has been authorized for detection and/or diagnosis of SARS-CoV-2 by FDA under an Emergency Use Authorization (EUA). This EUA will remain in effect (meaning this test can be used) for the duration of the COVID-19 declaration under Section 564(b)(1) of the Act, 21 U.S.C. section 360bbb-3(b)(1), unless  the authorization is terminated or revoked.  Performed at [facility], [address], [place], [place] [address]   Blood Culture (routine x 2)     Status: None   Collection Time: 09/21/23  7:59 PM   Specimen: BLOOD RIGHT ARM  Result Value Ref Range Status   Specimen Description BLOOD RIGHT ARM  Final   Special Requests   Final    BOTTLES DRAWN AEROBIC AND ANAEROBIC Blood Culture adequate volume   Culture   Final    NO GROWTH 6 DAYS Performed at [facility], [address] [place], [place] [address]    Report Status 09/27/2023 FINAL  Final     Radiology Studies: [place] EKG SITE RITE  Result Date: 09/26/2023 If Site Rite image not attached, placement could not be confirmed due to current cardiac rhythm.  CT ABDOMEN PELVIS W CONTRAST  Result Date: 09/26/2023 CLINICAL DATA:  Intra-abdominal abscess, history of diverticulitis EXAM: CT ABDOMEN AND PELVIS WITH CONTRAST TECHNIQUE: Multidetector CT imaging of the abdomen and pelvis was performed using the standard protocol following bolus administration of intravenous contrast. RADIATION DOSE REDUCTION: This exam was performed according to the departmental dose-optimization program which includes automated exposure control, adjustment of the mA and/or kV according to patient size and/or use of iterative reconstruction technique. CONTRAST:  OMNIPAQUE IOHEXOL 350 MG/ML SOLN COMPARISON:  09/21/2023 FINDINGS: Lower chest: No acute pleural or parenchymal lung disease. Hepatobiliary: Cholelithiasis without acute cholecystitis. Liver is unremarkable. No biliary duct dilation. Pancreas: Unremarkable. No pancreatic ductal dilatation or surrounding inflammatory changes. Spleen: Normal in size without focal abnormality. Adrenals/Urinary Tract: Adrenal glands are unremarkable. Kidneys are normal, without renal calculi, focal lesion, or hydronephrosis. Bladder is unremarkable. Stomach/Bowel: No bowel obstruction or ileus. Continued findings  of acute sigmoid diverticulitis. Enlarging phlegmon interposed between the inflamed sigmoid colon and bladder dome, measuring 6.4 x 4.6 x 4.8 cm reference image 74/3, compatible with developing abscess. Normal appendix right lower quadrant. Vascular/Lymphatic: No significant vascular findings are present. No enlarged abdominal or pelvic lymph nodes. Reproductive: Uterus and bilateral adnexa are unremarkable. Other: No free fluid or free intraperitoneal gas. No abdominal wall hernia. Musculoskeletal: No acute or destructive bony abnormalities. Reconstructed images demonstrate no additional findings. IMPRESSION: 1. Acute sigmoid diverticulitis, with enlarging phlegmon interposed between the sigmoid colon and bladder dome compatible with developing abscess. Given size, location, and body habitus, this is likely not amenable to percutaneous drainage. 2. Cholelithiasis without cholecystitis. Electronically Signed  By: Sharlet Salina M.D.   On: 09/26/2023 15:19    Scheduled Meds:  enoxaparin (LOVENOX) injection  100 mg Subcutaneous Daily   feeding supplement  1 Container Oral TID BM   insulin aspart  0-6 Units Subcutaneous TID WC   lamoTRIgine  150 mg Oral QHS   levothyroxine  150 mcg Oral Q0600   modafinil  300 mg Oral Daily   potassium chloride  40 mEq Oral Daily   sertraline  200 mg Oral Daily   Continuous Infusions:  piperacillin-tazobactam (ZOSYN)  IV 3.375 g (09/27/23 0521)     LOS: 6 days   Burnadette Pop, MD [facility] P10/30/2024, 1:15 PM

## 2023-09-28 ENCOUNTER — Other Ambulatory Visit: Payer: Self-pay

## 2023-09-28 ENCOUNTER — Other Ambulatory Visit (HOSPITAL_COMMUNITY): Payer: Self-pay

## 2023-09-28 DIAGNOSIS — K5720 Diverticulitis of large intestine with perforation and abscess without bleeding: Secondary | ICD-10-CM | POA: Diagnosis not present

## 2023-09-28 LAB — COMPREHENSIVE METABOLIC PANEL
ALT: 19 U/L (ref 0–44)
AST: 18 U/L (ref 15–41)
Albumin: 2.6 g/dL — ABNORMAL LOW (ref 3.5–5.0)
Alkaline Phosphatase: 80 U/L (ref 38–126)
Anion gap: 13 (ref 5–15)
BUN: 16 mg/dL (ref 6–20)
CO2: 22 mmol/L (ref 22–32)
Calcium: 8.9 mg/dL (ref 8.9–10.3)
Chloride: 104 mmol/L (ref 98–111)
Creatinine, Ser: 1.02 mg/dL — ABNORMAL HIGH (ref 0.44–1.00)
GFR, Estimated: >60 mL/min (ref >60)
Glucose, Bld: 87 mg/dL (ref 70–99)
Potassium: 4.2 mmol/L (ref 3.5–5.1)
Sodium: 139 mmol/L (ref 135–145)
Total Bilirubin: 0.3 mg/dL (ref 0.3–1.2)
Total Protein: 6.2 g/dL — ABNORMAL LOW (ref 6.5–8.1)

## 2023-09-28 LAB — GLUCOSE, CAPILLARY
Glucose-Capillary: 86 mg/dL (ref 70–99)
Glucose-Capillary: 116 mg/dL — ABNORMAL HIGH (ref 70–99)

## 2023-09-28 MED ORDER — CIPROFLOXACIN HCL 500 MG PO TABS: 500.0000 mg | ORAL_TABLET | Freq: Two times a day (BID) | ORAL | Status: DC

## 2023-09-28 MED ORDER — METRONIDAZOLE 500 MG PO TABS
500.0000 mg | ORAL_TABLET | Freq: Two times a day (BID) | ORAL | Status: DC
  Administered 2023-09-28: 500 mg via ORAL
  Filled 2023-09-28: qty 1

## 2023-09-28 MED ORDER — CIPROFLOXACIN HCL 500 MG PO TABS
750.0000 mg | ORAL_TABLET | Freq: Two times a day (BID) | ORAL | Status: DC
  Administered 2023-09-28: 750 mg via ORAL
  Filled 2023-09-28: qty 2

## 2023-09-28 MED ORDER — CIPROFLOXACIN HCL 750 MG PO TABS
750.0000 mg | ORAL_TABLET | Freq: Two times a day (BID) | ORAL | 0 refills | Status: AC
  Filled 2023-09-28: qty 56, 28d supply, fill #0

## 2023-09-28 MED ORDER — METRONIDAZOLE 500 MG PO TABS
500.0000 mg | ORAL_TABLET | Freq: Two times a day (BID) | ORAL | 0 refills | Status: DC
  Filled 2023-09-28: qty 28, 14d supply, fill #0

## 2023-09-28 NOTE — Discharge Summary (Signed)
Physician Discharge Summary  SHANTESE LAWES QMV:[id number] DOB: 12/11/1980 DOA: 09/21/2023  PCP: Corwin Levins, MD  Admit date: 09/21/2023 Discharge date: 09/28/2023  Admitted From: Home Disposition:  Home  Discharge Condition:Stable CODE STATUS:FULL Diet recommendation: Heart Healthy  Brief/Interim Summary: Patient is a 42 year old female with history of diabetes type 2, morbid obesity with BMI of 79, hypothyroidism, recurrent cellulitis of foot who initially presented with abdominal pain.  CT imaging on presentation showed 5.5 cm abscess arising from the sigmoid colon.  General surgery consulted.  IR also consulted and it was concluded not to be amenable for drainage.  Repeat CT done on 10/29 showed enlarging phlegmon 6.4 x 4.6 x 4.8 cm ,centrally located and not amenable to percutaneous drainage.  She was on IV antibiotics.  ID also consulted.  Plan is to discharge her home with oral antibiotics, follow-up with general surgery as an outpatient as well as ID.  She will get repeat CT scan in next next few days.  Medically stable for discharge  Following problems were addressed during the hospitalization:  Intra-abdominal abscess: Presented with abdominal pain.  Initial CT showed 5.5 cm abscess arising from the sigmoid colon.  General surgery, IR consulted.  IR concluded that it is not amenable for drainage.  Due to her body habitus, she is high risk for surgery. Repeat CT done on 10/29 showed enlarging phlegmon 6.4 x 4.6 x 4.8 cm ,centrally located and not amenable to percutaneous drainage.    ID also consulted.  Plan is to discharge her home with oral antibiotics, follow-up with general surgery as an outpatient as well as ID.  She will get repeat CT scan in next next few days.  Medically stable for discharge. General surgery also discussing with tertiary care providers for outpatient follow-up   Diastolic congestive heart failure: Currently appears euvolemic.  Last echo showed EF of 55 to 60%,  no wall motion abnormality.  Takes losartan, torsemide, Aldactone at home.  Losartan discontinued because blood pressure is stable.   AKI: Resolved   Hypokalemia: Supplemented and corrected  Type 2 diabetes: Diet controlled.  Monitor blood sugars at home   Hypothyroidism: Continue Synthyroid   Depression: On Zoloft, Lamictal, Provigil   Normocytic anemia: Current hemoglobin stable in the range of 8   OSA: On CPAP  Discharge Diagnoses:  Principal Problem:   Diverticulitis of large intestine with abscess Active Problems:   Chronic diastolic CHF (congestive heart failure) (HCC)   Type 2 diabetes mellitus with obesity (HCC)   OSA (obstructive sleep apnea)   Hypertension associated with diabetes (HCC)   Hypothyroidism   Hyperlipidemia associated with type 2 diabetes mellitus (HCC)    Discharge Instructions  Discharge Instructions     Diet - low sodium heart healthy   Complete by: As directed    Discharge instructions   Complete by: As directed    1)Please take prescribed medications as instructed 2)Follow up with general surgery and infectious disease as an outpatient.  Name and number the provider group have been attached 3)Monitor your blood pressure at home   Increase activity slowly   Complete by: As directed       Allergies as of 09/28/2023       Reactions   Aspirin Hives, Swelling   REACTION: throat swelling, hives Other reaction(s): Unknown Other reaction(s): Unknown   Doxycycline Other (See Comments)   Abdominal pain, difficulty swallowing   Lisinopril Cough   Niacin And Related    Other reaction(s): Unknown Other reaction(s):  Unknown   Niaspan [niacin Er (antihyperlipidemic)]    Caused flushing   Wound Dressing Adhesive Itching   Welts and burning of the skin        Medication List     STOP taking these medications    amoxicillin-clavulanate 875-125 MG tablet Commonly known as: AUGMENTIN   losartan 100 MG tablet Commonly known as:  COZAAR   metoprolol succinate 50 MG 24 hr tablet Commonly known as: TOPROL-XL       TAKE these medications    ciprofloxacin 750 MG tablet Commonly known as: CIPRO Take 1 tablet (750 mg total) by mouth 2 (two) times daily for 28 days.   ibuprofen 200 MG tablet Commonly known as: ADVIL Take 800 mg by mouth daily as needed for headache or moderate pain (pain score 4-6).   lamoTRIgine 150 MG tablet Commonly known as: LaMICtal Take 1 tablet (150 mg total) by mouth daily. What changed: when to take this   levothyroxine 150 MCG tablet Commonly known as: SYNTHROID Take 1 tablet (150 mcg total) by mouth daily before breakfast. Must keep scheduled appt for future refills   metFORMIN 500 MG tablet Commonly known as: GLUCOPHAGE Take 1 tablet (500 mg total) by mouth daily. Must keep scheduled appt for future refills   metroNIDAZOLE 500 MG tablet Commonly known as: FLAGYL Take 1 tablet (500 mg total) by mouth every 12 (twelve) hours.   modafinil 100 MG tablet Commonly known as: PROVIGIL Take 1 tablet (100mg  total) by mouth daily at 9am (along with 200 mg tablet daily at 9am for a total of 300 mg daily) What changed: additional instructions   modafinil 200 MG tablet Commonly known as: PROVIGIL Take 1 tablet by mouth daily at 9am (along with 100 mg tablet daily at 9am (total 300mg )) What changed:  how much to take how to take this when to take this additional instructions   rosuvastatin 20 MG tablet Commonly known as: Crestor Take 1 tablet (20 mg total) by mouth daily.   Semaglutide (2 MG/DOSE) 8 MG/3ML Sopn Inject 2 mg into the skin as directed once a week. What changed: additional instructions   sertraline 100 MG tablet Commonly known as: ZOLOFT Take 2 tablets (200 mg total) by mouth daily.   spironolactone 25 MG tablet Commonly known as: ALDACTONE Take 1 tablet (25 mg total) by mouth daily.   torsemide 20 MG tablet Commonly known as: DEMADEX Take 4 tablets (80 mg  total) by mouth 2 (two) times daily as needed. What changed:  how much to take when to take this   traMADol 50 MG tablet Commonly known as: ULTRAM Take 1 tablet (50 mg total) by mouth every 8 (eight) hours as needed.   triamcinolone cream 0.1 % Commonly known as: KENALOG Apply 1 Application topically 2 (two) times daily. What changed:  when to take this reasons to take this   Vitamin D (Ergocalciferol) 1.25 MG (50000 UNIT) Caps capsule Commonly known as: DRISDOL Take 1 capsule (50,000 Units total) by mouth every 7 (seven) days. What changed:  when to take this additional instructions        Follow-up Information     [facility] Follow up.   Why: [phone]        [facility] Follow up.   Specialty: Home Health Services Contact information: [address] [place] [address] [phone]         Viviana Simpler. Go on 10/03/2023.   Why: Your appointment is  11/5 at 9am Arrive 15 minutes early for check in. Contact information: [address] [place], [place]  [phone]        [facility], [name]. Call.   Specialty: General Surgery Why: As needed Contact information: [address] [place] [address] [phone]        Corwin Levins, MD. Schedule an appointment as soon as possible for a visit in 1 week(s).   Specialties: Internal Medicine, Radiology Contact information: [address] [place] [address] [phone]                Allergies  Allergen Reactions   Aspirin Hives and Swelling    REACTION: throat swelling, hives Other reaction(s): Unknown Other reaction(s): Unknown   Doxycycline Other (See Comments)    Abdominal pain, difficulty swallowing   Lisinopril Cough   Niacin And Related     Other reaction(s): Unknown Other reaction(s): Unknown   Niaspan [Niacin Er (Antihyperlipidemic)]     Caused flushing   Wound Dressing Adhesive Itching     Welts and burning of the skin    Consultations: General Surgery, ID   Procedures/Studies: [place] EKG SITE RITE  Result Date: 09/28/2023 If Site Rite image not attached, placement could not be confirmed due to current cardiac rhythm.  [place] EKG SITE RITE  Result Date: 09/26/2023 If Site Rite image not attached, placement could not be confirmed due to current cardiac rhythm.  CT ABDOMEN PELVIS W CONTRAST  Result Date: 09/26/2023 CLINICAL DATA:  Intra-abdominal abscess, history of diverticulitis EXAM: CT ABDOMEN AND PELVIS WITH CONTRAST TECHNIQUE: Multidetector CT imaging of the abdomen and pelvis was performed using the standard protocol following bolus administration of intravenous contrast. RADIATION DOSE REDUCTION: This exam was performed according to the departmental dose-optimization program which includes automated exposure control, adjustment of the mA and/or kV according to patient size and/or use of iterative reconstruction technique. CONTRAST:  OMNIPAQUE IOHEXOL 350 MG/ML SOLN COMPARISON:  09/21/2023 FINDINGS: Lower chest: No acute pleural or parenchymal lung disease. Hepatobiliary: Cholelithiasis without acute cholecystitis. Liver is unremarkable. No biliary duct dilation. Pancreas: Unremarkable. No pancreatic ductal dilatation or surrounding inflammatory changes. Spleen: Normal in size without focal abnormality. Adrenals/Urinary Tract: Adrenal glands are unremarkable. Kidneys are normal, without renal calculi, focal lesion, or hydronephrosis. Bladder is unremarkable. Stomach/Bowel: No bowel obstruction or ileus. Continued findings of acute sigmoid diverticulitis. Enlarging phlegmon interposed between the inflamed sigmoid colon and bladder dome, measuring 6.4 x 4.6 x 4.8 cm reference image 74/3, compatible with developing abscess. Normal appendix right lower quadrant. Vascular/Lymphatic: No significant vascular findings are present. No enlarged abdominal or pelvic lymph nodes.  Reproductive: Uterus and bilateral adnexa are unremarkable. Other: No free fluid or free intraperitoneal gas. No abdominal wall hernia. Musculoskeletal: No acute or destructive bony abnormalities. Reconstructed images demonstrate no additional findings. IMPRESSION: 1. Acute sigmoid diverticulitis, with enlarging phlegmon interposed between the sigmoid colon and bladder dome compatible with developing abscess. Given size, location, and body habitus, this is likely not amenable to percutaneous drainage. 2. Cholelithiasis without cholecystitis. Electronically Signed   By: Sharlet Salina M.D.   On: 09/26/2023 15:19   CT ABDOMEN PELVIS W CONTRAST  Result Date: 09/21/2023 CLINICAL DATA:  Left lower quadrant pain.  Diverticulitis. EXAM: CT ABDOMEN AND PELVIS WITH CONTRAST TECHNIQUE: Multidetector CT imaging of the abdomen and pelvis was performed using the standard protocol following bolus administration of intravenous contrast. RADIATION DOSE REDUCTION: This exam was performed according to the departmental dose-optimization  program which includes automated exposure control, adjustment of the mA and/or kV according to patient size and/or use of iterative reconstruction technique. CONTRAST:  75mL OMNIPAQUE IOHEXOL 350 MG/ML SOLN COMPARISON:  CT 03/19/2023 FINDINGS: Lower chest: The heart is borderline enlarged. No basilar airspace disease or pleural effusion. Hepatobiliary: Enlarged liver spanning 21.2 cm cranial caudal. No focal liver abnormality. Calcified gallstone without pericholecystic inflammation. No biliary dilatation. Pancreas: No ductal dilatation or inflammation. Spleen: Enlarged, 15.1 cm AP. Adrenals/Urinary Tract: No adrenal nodule. Lobulated bilateral renal contours. No hydronephrosis. No renal calculi. Moderate wall thickening about the dome of the bladder, but no definite intravesicular air. Stomach/Bowel: Moderate length segment of colonic wall thickening in the proximal sigmoid in the region of  multiple diverticula, likely representing diverticulitis. There is a poorly defined extraluminal collection extending inferior to the inflamed colon abutting the dome of the bladder suspicious for abscess. This measures approximately 5.6 x 6.9 x 3.2 cm, series 3, image 72 and sagittal series 7, image 100. Collection is poorly defined with internal air and predominantly soft tissue thickening, small amount of intraluminal fluid. Ill-defined soft tissue tract of sent from the colon. Collection abuts the dome of the bladder with associated wall thickening. Multiple additional noninflamed colonic diverticula. No small bowel obstruction or inflammation. Vascular/Lymphatic: Normal caliber abdominal aorta. No evidence of portal or mesenteric thrombus. There prominent bilateral inguinal and external iliac nodes are likely reactive. Reproductive: Colonic inflammatory changes extend to the anterior aspect of the uterus. Adnexa are poorly defined. Other: Other than the pericolonic collection there is no pneumoperitoneum. No significant free fluid. Generalized edema in the body wall which appears to be confluent in the lower abdominal pannus. Musculoskeletal: L5-S1 degenerative disc disease and facet hypertrophy. There are no acute or suspicious osseous abnormalities. IMPRESSION: 1. Acute sigmoid diverticulitis with poorly defined extraluminal collection extending inferior to the inflamed colon abutting the dome of the bladder suspicious for abscess. This measures approximately 5.6 x 6.9 x 3.2 cm. 2. Colonic inflammatory changes extend to the dome of the urinary bladder with associated bladder wall thickening as well as spot anterior aspect of the uterus. 3. Hepatosplenomegaly. 4. Cholelithiasis. 5. Generalized edema in the body wall which appears to be confluent in the lower abdominal pannus. Electronically Signed   By: Narda Rutherford M.D.   On: 09/21/2023 21:25   DG Chest 1 View  Result Date: 09/21/2023 CLINICAL DATA:   Abdominal pain.  Fever.  Sepsis. EXAM: CHEST  1 VIEW COMPARISON:  06/03/2022. FINDINGS: Bilateral lung fields are clear. Elevated right hemidiaphragm again seen. Bilateral costophrenic angles are clear. Normal cardio-mediastinal silhouette. No acute osseous abnormalities. The soft tissues are within normal limits. No free air under the domes of diaphragm. IMPRESSION: No active disease. Electronically Signed   By: Jules Schick M.D.   On: 09/21/2023 18:20   CT ABDOMEN PELVIS WO CONTRAST  Result Date: 09/18/2023 CLINICAL DATA:  LLQ abdominal pain EXAM: CT ABDOMEN AND PELVIS WITHOUT CONTRAST TECHNIQUE: Multidetector CT imaging of the abdomen and pelvis was performed following the standard protocol without IV contrast. RADIATION DOSE REDUCTION: This exam was performed according to the departmental dose-optimization program which includes automated exposure control, adjustment of the mA and/or kV according to patient size and/or use of iterative reconstruction technique. COMPARISON:  None Available. FINDINGS: Lower chest: No acute abnormality. Note that the absence of IV contrast limits assessment of the abdominal and pelvic solid organs Hepatobiliary: No focal liver lesion. No perihepatic fluid. There is cholelithiasis without evidence of cholecystitis.  No evidence of intra or extrahepatic biliary dilatation. Pancreas: No evidence of peripancreatic fat stranding to suggest pancreatitis. Spleen: Normal in size without focal abnormality. Adrenals/Urinary Tract: Bilateral adrenal glands are normal in size. No focal renal lesions are visualized. Bilateral kidneys are without evidence of hydronephrosis or nephrolithiasis. The urinary bladder is fluid-filled with wall thickening of the dome of the bladder (series 6, image 108). Stomach/Bowel: No evidence of bowel obstruction. There is evidence of diverticulitis with a possible air-containing fluid collection abutting the bladder (series 6, image 147). The appendix is  appearance. Vascular/Lymphatic: No significant vascular findings are present. No enlarged abdominal or pelvic lymph nodes. Reproductive: Uterus and bilateral adnexa are unremarkable. Other: No abdominal wall hernia or abnormality. No abdominopelvic ascites. Musculoskeletal: No acute or significant osseous findings. IMPRESSION: 1. Evidence of diverticulitis with a possible air-containing fluid collection abutting the bladder. Recommend further evaluation with CT of the abdomen and pelvis with IV contrast. 2. Wall thickening of the dome of the bladder, which may be reactive or may represent cystitis. Recommend correlation with urinalysis. Note that early fistulous connection is not entirely excluded. Electronically Signed   By: Lorenza Cambridge M.D.   On: 09/18/2023 12:36      Subjective:  Patient seen and examined the bedside today.  Hemodynamically stable.  She looks comfortable.  Sitting on the chair.  Does not complain of any significant abdominal pain.  Just feels some pressure while having bowel movement.  No fever or leukocytosis.  Medically stable for discharge home today.  Discharge Exam: Vitals:   09/28/23 0415 09/28/23 0803  BP: 137/76 (!) 144/68  Pulse: 74 73  Resp: 17 16  Temp: 98.2 F (36.8 C) 98.8 F (37.1 C)  SpO2: 100% 97%   Vitals:   09/27/23 1953 09/28/23 0415 09/28/23 0417 09/28/23 0803  BP: 136/64 137/76  (!) 144/68  Pulse: 71 74  73  Resp: 17 17  16   Temp: 97.8 F (36.6 C) 98.2 F (36.8 C)  98.8 F (37.1 C)  TempSrc:    Oral  SpO2: 98% 100%  97%  Weight:   (!) 192.8 kg   Height:        General: Pt is alert, awake, not in acute distress, morbidly obese Cardiovascular: RRR, S1/S2 +, no rubs, no gallops Respiratory: CTA bilaterally, no wheezing, no rhonchi Abdominal: Soft, NT, ND, bowel sounds + Extremities: no edema, no cyanosis    The results of significant diagnostics from this hospitalization (including imaging, microbiology, ancillary and laboratory) are  listed below for reference.     Microbiology: Recent Results (from the past 240 hour(s))  Blood Culture (routine x 2)     Status: None   Collection Time: 09/21/23  5:00 PM   Specimen: BLOOD RIGHT ARM  Result Value Ref Range Status   Specimen Description BLOOD RIGHT ARM  Final   Special Requests   Final    BOTTLES DRAWN AEROBIC ONLY Blood Culture results may not be optimal due to an inadequate volume of blood received in culture bottles   Culture   Final    NO GROWTH 6 DAYS Performed at [facility], [address], [place], [place] [address]    Report Status 09/27/2023 FINAL  Final  Resp panel by RT-PCR (RSV, Flu A&B, Covid) Anterior Nasal Swab     Status: None   Collection Time: 09/21/23  7:59 PM   Specimen: Anterior Nasal Swab  Result Value Ref Range Status   SARS Coronavirus 2 by RT  PCR NEGATIVE NEGATIVE Final   Influenza A by PCR NEGATIVE NEGATIVE Final   Influenza B by PCR NEGATIVE NEGATIVE Final    Comment: (NOTE) The Xpert Xpress SARS-CoV-2/FLU/RSV plus assay is intended as an aid in the diagnosis of influenza from Nasopharyngeal swab specimens and should not be used as a sole basis for treatment. Nasal washings and aspirates are unacceptable for Xpert Xpress SARS-CoV-2/FLU/RSV testing.  Fact Sheet for Patients: BloggerCourse.com  Fact Sheet for Healthcare Providers: SeriousBroker.it  This test is not yet approved or cleared by the [place] FDA and has been authorized for detection and/or diagnosis of SARS-CoV-2 by FDA under an Emergency Use Authorization (EUA). This EUA will remain in effect (meaning this test can be used) for the duration of the COVID-19 declaration under Section 564(b)(1) of the Act, 21 U.S.C. section 360bbb-3(b)(1), unless the authorization is terminated or revoked.     Resp Syncytial Virus by PCR NEGATIVE NEGATIVE Final    Comment: (NOTE) Fact Sheet for  Patients: BloggerCourse.com  Fact Sheet for Healthcare Providers: SeriousBroker.it  This test is not yet approved or cleared by the [place] FDA and has been authorized for detection and/or diagnosis of SARS-CoV-2 by FDA under an Emergency Use Authorization (EUA). This EUA will remain in effect (meaning this test can be used) for the duration of the COVID-19 declaration under Section 564(b)(1) of the Act, 21 U.S.C. section 360bbb-3(b)(1), unless the authorization is terminated or revoked.  Performed at [facility], [address], [place], [place] [address]   Blood Culture (routine x 2)     Status: None   Collection Time: 09/21/23  7:59 PM   Specimen: BLOOD RIGHT ARM  Result Value Ref Range Status   Specimen Description BLOOD RIGHT ARM  Final   Special Requests   Final    BOTTLES DRAWN AEROBIC AND ANAEROBIC Blood Culture adequate volume   Culture   Final    NO GROWTH 6 DAYS Performed at [facility], [address] [place], [place] [address]    Report Status 09/27/2023 FINAL  Final     Labs: BNP (last 3 results) No results for input(s): "BNP" in the last 8760 hours. Basic Metabolic Panel: Recent Labs  Lab 09/24/23 1024 09/25/23 0859 09/26/23 1212 09/27/23 0822 09/28/23 0925  NA 137 136 136 139 139  K 3.2* 2.9* 3.1* 3.4* 4.2  CL 100 100 100 101 104  CO2 26 27 26 25 22   GLUCOSE 102* 113* 170* 141* 87  BUN 16 14 14 15 16   CREATININE 1.37* 1.40* 1.37* 1.16* 1.02*  CALCIUM 8.8* 8.5* 8.9 9.2 8.9   Liver Function Tests: Recent Labs  Lab 09/23/23 0451 09/24/23 1024 09/25/23 0859 09/27/23 0822 09/28/23 0925  AST 15 13* 11* 12* 18  ALT 15 17 15 15 19   ALKPHOS 82 102 88 79 80  BILITOT 0.3 0.5 0.4 0.3 0.3  PROT 6.1* 6.6 6.4* 5.9* 6.2*  ALBUMIN 2.7* 2.9* 2.7* 2.5* 2.6*   No results for input(s): "LIPASE", "AMYLASE" in the last 168 hours. No results for input(s): "AMMONIA" in the last 168  hours. CBC: Recent Labs  Lab 09/21/23 1704 09/22/23 0721 09/23/23 0451 09/25/23 0859 09/26/23 0704 09/27/23 0822  WBC 9.4 7.1 7.4 6.1 6.2 5.2  NEUTROABS 8.1*  --  5.4  --   --   --   HGB 10.7* 9.0* 8.5* 8.6* 8.7* 8.5*  HCT 36.1 29.4* 27.7* 27.9* 28.7* 28.2*  MCV 90.0 87.2 86.3 87.5 87.2 87.0  PLT 277 219 218 242 254 243   Cardiac Enzymes: No results for input(s): "CKTOTAL", "CKMB", "CKMBINDEX", "TROPONINI" in the last 168 hours. BNP: Invalid input(s): "POCBNP" CBG: Recent Labs  Lab 09/27/23 1156 09/27/23 1704 09/27/23 1956 09/28/23 0804 09/28/23 1132  GLUCAP 94 121* 131* 86 116*   D-Dimer No results for input(s): "DDIMER" in the last 72 hours. Hgb A1c No results for input(s): "HGBA1C" in the last 72 hours. Lipid Profile No results for input(s): "CHOL", "HDL", "LDLCALC", "TRIG", "CHOLHDL", "LDLDIRECT" in the last 72 hours. Thyroid function studies No results for input(s): "TSH", "T4TOTAL", "T3FREE", "THYROIDAB" in the last 72 hours.  Invalid input(s): "FREET3" Anemia work up No results for input(s): "VITAMINB12", "FOLATE", "FERRITIN", "TIBC", "IRON", "RETICCTPCT" in the last 72 hours. Urinalysis    Component Value Date/Time   COLORURINE YELLOW 09/22/2023 0047   APPEARANCEUR CLEAR 09/22/2023 0047   LABSPEC 1.018 09/22/2023 0047   PHURINE 6.0 09/22/2023 0047   GLUCOSEU NEGATIVE 09/22/2023 0047   GLUCOSEU NEGATIVE 09/23/2022 1134   HGBUR NEGATIVE 09/22/2023 0047   BILIRUBINUR NEGATIVE 09/22/2023 0047   KETONESUR NEGATIVE 09/22/2023 0047   PROTEINUR NEGATIVE 09/22/2023 0047   UROBILINOGEN 0.2 09/23/2022 1134   NITRITE NEGATIVE 09/22/2023 0047   LEUKOCYTESUR NEGATIVE 09/22/2023 0047   Sepsis Labs Recent Labs  Lab 09/23/23 0451 09/25/23 0859 09/26/23 0704 09/27/23 0822  WBC 7.4 6.1 6.2 5.2   Microbiology Recent Results (from the past 240 hour(s))  Blood Culture (routine x 2)     Status: None   Collection Time: 09/21/23  5:00 PM   Specimen: BLOOD RIGHT  ARM  Result Value Ref Range Status   Specimen Description BLOOD RIGHT ARM  Final   Special Requests   Final    BOTTLES DRAWN AEROBIC ONLY Blood Culture results may not be optimal due to an inadequate volume of blood received in culture bottles   Culture   Final    NO GROWTH 6 DAYS Performed at [facility], [address] [place], [place] [address]    Report Status 09/27/2023 FINAL  Final  Resp panel by RT-PCR (RSV, Flu A&B, Covid) Anterior Nasal Swab     Status: None   Collection Time: 09/21/23  7:59 PM   Specimen: Anterior Nasal Swab  Result Value Ref Range Status   SARS Coronavirus 2 by RT PCR NEGATIVE NEGATIVE Final   Influenza A by PCR NEGATIVE NEGATIVE Final   Influenza B by PCR NEGATIVE NEGATIVE Final    Comment: (NOTE) The Xpert Xpress SARS-CoV-2/FLU/RSV plus assay is intended as an aid in the diagnosis of influenza from Nasopharyngeal swab specimens and should not be used as a sole basis for treatment. Nasal washings and aspirates are unacceptable for Xpert Xpress SARS-CoV-2/FLU/RSV testing.  Fact Sheet for Patients: BloggerCourse.com  Fact Sheet for Healthcare Providers: SeriousBroker.it  This test is not yet approved or cleared by the [place] FDA and has been authorized for detection and/or diagnosis of SARS-CoV-2 by FDA under an Emergency Use Authorization (EUA). This EUA will remain in effect (meaning this test can be used) for the duration of the COVID-19 declaration under Section 564(b)(1) of the Act, 21 U.S.C. section 360bbb-3(b)(1), unless the authorization is terminated or revoked.     Resp Syncytial Virus by PCR NEGATIVE NEGATIVE Final    Comment: (NOTE) Fact Sheet for Patients: BloggerCourse.com  Fact Sheet for Healthcare Providers: SeriousBroker.it  This test is not yet approved or cleared by the [place] and has been authorized  for  detection and/or diagnosis of SARS-CoV-2 by FDA under an Emergency Use Authorization (EUA). This EUA will remain in effect (meaning this test can be used) for the duration of the COVID-19 declaration under Section 564(b)(1) of the Act, 21 U.S.C. section 360bbb-3(b)(1), unless the authorization is terminated or revoked.  Performed at [facility], [address], [place], [place] [address]   Blood Culture (routine x 2)     Status: None   Collection Time: 09/21/23  7:59 PM   Specimen: BLOOD RIGHT ARM  Result Value Ref Range Status   Specimen Description BLOOD RIGHT ARM  Final   Special Requests   Final    BOTTLES DRAWN AEROBIC AND ANAEROBIC Blood Culture adequate volume   Culture   Final    NO GROWTH 6 DAYS Performed at [facility], [address], [place], [place] [address]    Report Status 09/27/2023 FINAL  Final    Please note: You were cared for by a hospitalist during your hospital stay. Once you are discharged, your primary care physician will handle any further medical issues. Please note that NO REFILLS for any discharge medications will be authorized once you are discharged, as it is imperative that you return to your primary care physician (or establish a relationship with a primary care physician if you do not have one) for your post hospital discharge needs so that they can reassess your need for medications and monitor your lab values.    Time coordinating discharge: 40 minutes  SIGNED:   Burnadette Pop, MD  [facility] 09/28/2023, 2:06 PM Pager [phone]  If 7PM-7AM, please contact night-coverage [email or web address] Password [email or web address]

## 2023-09-28 NOTE — Progress Notes (Signed)
Wendi Maya to be D/C'd  per MD order.  Discussed with the patient and all questions fully answered.  VSS, Skin clean, dry and intact without evidence of skin break down, no evidence of skin tears noted.  IV catheter discontinued intact. Site without signs and symptoms of complications. Dressing and pressure applied.  An After Visit Summary was printed and given to the patient. Picked up medications from [facility]  and given to patient.   D/c education completed with patient/family including follow up instructions, medication list, d/c activities limitations if indicated, with other d/c instructions as indicated by MD - patient able to verbalize understanding, all questions fully answered.   Patient instructed to return to ED, call 911, or call MD for any changes in condition.   Patient to be escorted via WC, and D/C home, pt personal vehicle was brought to valet parking entrance and pt drove herself home.

## 2023-09-28 NOTE — TOC Progression Note (Signed)
Transition of Care (TOC) - Progression Note   Plan to discharge on PO antibiotics. Pam with Viviana Simpler with [facility] notified Patient Details  Name: Alexandra Beck MRN: [id number] Date of Birth: 03-10-1981  Transition of Care [facility]) CM/SW Contact  Sarika Baldini, Adria Devon, RN Phone Number: 09/28/2023, 3:28 PM  Clinical Narrative:       Expected Discharge Plan: Home w Home Health Services Barriers to Discharge: Continued Medical Work up  Expected Discharge Plan and Services   Discharge Planning Services: CM Consult Post Acute Care Choice: Home Health Living arrangements for the past 2 months: Single Family Home Expected Discharge Date: 09/28/23               DME Arranged: N/A         HH Arranged: RN HH Agency: [facility] Date [facility] Agency Contacted: 09/27/23 Time HH Agency Contacted: 1232 Representative spoke with at [facility] Agency: Kandee Keen   Social Determinants of Health (SDOH) Interventions SDOH Screenings   Food Insecurity: No Food Insecurity (09/22/2023)  Housing: Low Risk  (09/22/2023)  Transportation Needs: No Transportation Needs (09/22/2023)  Utilities: Not At Risk (09/22/2023)  Depression (PHQ2-9): Medium Risk (06/30/2023)  Tobacco Use: Medium Risk (09/22/2023)    Readmission Risk Interventions     No data to display

## 2023-09-28 NOTE — Plan of Care (Signed)

## 2023-09-28 NOTE — Plan of Care (Signed)
  Problem: Clinical Measurements: Goal: Will remain free from infection Outcome: Progressing   Problem: Nutrition: Goal: Adequate nutrition will be maintained Outcome: Progressing   Problem: Coping: Goal: Level of anxiety will decrease Outcome: Progressing   Problem: Clinical Measurements: Goal: Ability to maintain clinical measurements within normal limits will improve Outcome: Progressing

## 2023-09-28 NOTE — Progress Notes (Addendum)
[facility]  Date of Admission:  09/21/2023   Total days of inpatient antibiotics 6  Principal Problem:   Diverticulitis of large intestine with abscess Active Problems:   Hypertension associated with diabetes (HCC)   OSA (obstructive sleep apnea)   Hyperlipidemia associated with type 2 diabetes mellitus (HCC)   Type 2 diabetes mellitus with obesity (HCC)   Hypothyroidism   Chronic diastolic CHF (congestive heart failure) [facility])          Assessment: 42 year old female with history of diabetes mellitus(A1c 6), morbid obesity and recently diagnosed diverticulitis on Augmentin on 10/21 presented with abdominal pain and found to have intra-abdominal abscess: #Intra-abdominal abscess - Patient was started on pip-tazo.  Initial CT on 10/24 showed intra-abdominal abscess measuring 5.6 X6.9X 3.2 cm.  General surgery engaged and recommended medical management at that point, IR engagement. - IR engaged noted no clear percutaneous window for aspiration. - Repeat CT on 10/29 showed enlarging phlegmon between sigmoid colon and bladder measuring 6.4 X4.6X 4.8 cm. - Plan was discharged on pip-tazo.  ID engaged for OPAT antibiotic recommendations - Recommended transition to Cipro(QTc 445 on 09/22/2023) given high bioavailability, patient tolerated p.o. intake without difficulty and metronidazole. - Plan was to continue pip-tazo per discussion with surgery.  As an outcome of further case discussion between surgery and colorectal colleagues now  amenable to p.o. antibiotics.  Will leave ID follow-up with Marcos Eke, NP on 11/8 in place to follow-up on antibiotic tolerance. Recommendations: -DC pip-tazo - Start ciprofloxacin 750 mg p.o. twice daily and metronidazole 500 mg p.o. twice daily.  Discussed plan with patient. -Anticipate at least 4 weeks of antibiotics.  Final antibiotic plan pending clinical and radiographic progression. - Follow-up CT planned on 11/5 -  Follow-up with infectious disease on 11/8 to ensure antibiotic tolerance -ID will sign off  Microbiology:   Antibiotics: Antibiotics: Piperacillin/tazobactam 10/24-present Metronidazole 10/25 Ceftriaxone 10/25   Cultures: Blood 10/24 no growth  SUBJECTIVE: Sitting in chair. Discussed plan Interval: Afebrile overnight. Wbc 5.2k  Review of Systems: Review of Systems  All other systems reviewed and are negative.    Scheduled Meds:  ciprofloxacin  750 mg Oral BID   enoxaparin (LOVENOX) injection  100 mg Subcutaneous Daily   feeding supplement  1 Container Oral TID BM   insulin aspart  0-6 Units Subcutaneous TID WC   lamoTRIgine  150 mg Oral QHS   levothyroxine  150 mcg Oral Q0600   metroNIDAZOLE  500 mg Oral Q12H   modafinil  300 mg Oral Daily   sertraline  200 mg Oral Daily   Continuous Infusions: PRN Meds:.acetaminophen **OR** acetaminophen, ketorolac, ondansetron (ZOFRAN) IV, oxyCODONE-acetaminophen, traMADol Allergies  Allergen Reactions   Aspirin Hives and Swelling    REACTION: throat swelling, hives Other reaction(s): Unknown Other reaction(s): Unknown   Doxycycline Other (See Comments)    Abdominal pain, difficulty swallowing   Lisinopril Cough   Niacin And Related     Other reaction(s): Unknown Other reaction(s): Unknown   Niaspan [Niacin Er (Antihyperlipidemic)]     Caused flushing   Wound Dressing Adhesive Itching    Welts and burning of the skin    OBJECTIVE: Vitals:   09/27/23 1953 09/28/23 0415 09/28/23 0417 09/28/23 0803  BP: 136/64 137/76  (!) 144/68  Pulse: 71 74  73  Resp: 17 17  16   Temp: 97.8 F (36.6 C) 98.2 F (36.8 C)  98.8 F (37.1 C)  TempSrc:  Oral  SpO2: 98% 100%  97%  Weight:   (!) 192.8 kg   Height:       Body mass index is 80.31 kg/m.  Physical Exam Constitutional:      Appearance: Normal appearance.  HENT:     Head: Normocephalic and atraumatic.     Right Ear: Tympanic membrane normal.     Left Ear: Tympanic  membrane normal.     Nose: Nose normal.     Mouth/Throat:     Mouth: Mucous membranes are moist.  Eyes:     Extraocular Movements: Extraocular movements intact.     Conjunctiva/sclera: Conjunctivae normal.     Pupils: Pupils are equal, round, and reactive to light.  Cardiovascular:     Rate and Rhythm: Normal rate and regular rhythm.     Heart sounds: No murmur heard.    No friction rub. No gallop.  Pulmonary:     Effort: Pulmonary effort is normal.     Breath sounds: Normal breath sounds.  Abdominal:     General: Abdomen is flat.     Palpations: Abdomen is soft.  Musculoskeletal:        General: Normal range of motion.  Skin:    General: Skin is warm and dry.  Neurological:     General: No focal deficit present.     Mental Status: She is alert and oriented to person, place, and time.  Psychiatric:        Mood and Affect: Mood normal.       Lab Results Lab Results  Component Value Date   WBC 5.2 09/27/2023   HGB 8.5 (L) 09/27/2023   HCT 28.2 (L) 09/27/2023   MCV 87.0 09/27/2023   PLT 243 09/27/2023    Lab Results  Component Value Date   CREATININE 1.02 (H) 09/28/2023   BUN 16 09/28/2023   NA 139 09/28/2023   K 4.2 09/28/2023   CL 104 09/28/2023   CO2 22 09/28/2023    Lab Results  Component Value Date   ALT 19 09/28/2023   AST 18 09/28/2023   ALKPHOS 80 09/28/2023   BILITOT 0.3 09/28/2023        Danelle Earthly, MD [facility] 09/28/2023, 1:28 PM I have personally spent 52 minutes involved in face-to-face and non-face-to-face activities for this patient on the day of the visit. Professional time spent includes the following activities: Preparing to see the patient (review of tests), Obtaining and/or reviewing separately obtained history (admission/discharge record), Performing a medically appropriate examination and/or evaluation , Ordering medications/tests/procedures, referring and communicating with  other health care professionals, Documenting clinical information in the EMR, Independently interpreting results (not separately reported), Communicating results to the patient/family/caregiver, Counseling and educating the patient/family/caregiver and Care coordination (not separately reported).

## 2023-09-28 NOTE — Consult Note (Signed)
[facility]    Date of Admission:  09/21/2023   Total days of inpatient antibiotics 5        Reason for Consult: Intra-abdominal abscess    Principal Problem:   Diverticulitis of large intestine with abscess Active Problems:   Hypertension associated with diabetes (HCC)   OSA (obstructive sleep apnea)   Hyperlipidemia associated with type 2 diabetes mellitus (HCC)   Type 2 diabetes mellitus with obesity (HCC)   Hypothyroidism   Chronic diastolic CHF (congestive heart failure) [facility])   Assessment: 42 year old female with history of diabetes mellitus(A1c 6), morbid obesity and recently diagnosed diverticulitis on Augmentin on 10/21 presented with abdominal pain and found to have intra-abdominal abscess: #Intra-abdominal abscess - Patient was started on pip-tazo.  Initial CT on 10/24 showed intra-abdominal abscess measuring 5.6 X6.9X 3.2 cm.  General surgery engaged and recommended medical management at that point, IR engagement. - IR engaged noted no clear percutaneous window for aspiration. - Repeat CT on 10/29 showed enlarging phlegmon between sigmoid colon and bladder measuring 6.4 X4.6X 4.8 cm. - Plan was discharged on pip-tazo.  ID engaged for OPAT antibiotic recommendations - On plan to transition to Cipro given high bioavailability, patient tolerated p.o. intake without difficulty and metronidazole.  Spoke with surgery and they noted that possible transfer out to another facility, close CT follow-up on 11/5, and concern of risk of failure of p.o. antibiotics, body habitus with concern of antibiotic distribution would like IV recommendations. - I think given the bioavailability of ciprofloxacin and tolerating p.o. as noted above it will be a good prolonged antibiotic option.  But given that there is a chance of transfer out and the risk of failure would lead to invasive surgery including laparotomy, okay to continue IV antibiotics at this  point.  Recommendations:  -Place PICC - Follow up on surgical plan including transfer patient to outside facility.  Again, there does not seem to be a benefit of IV or p.o. antibiotics as patient is noted to be clinically stable.  But given the enlarging abscess and risk of surgery will continue IV.  OPAT ORDERS:  Diagnosis: Intra-abdominal abscess  Allergies  Allergen Reactions   Aspirin Hives and Swelling    REACTION: throat swelling, hives Other reaction(s): Unknown Other reaction(s): Unknown   Doxycycline Other (See Comments)    Abdominal pain, difficulty swallowing   Lisinopril Cough   Niacin And Related     Other reaction(s): Unknown Other reaction(s): Unknown   Niaspan [Niacin Er (Antihyperlipidemic)]     Caused flushing   Wound Dressing Adhesive Itching    Welts and burning of the skin     Discharge antibiotics to be given via PICC line:  Per pharmacy protocol 3.375 gm q8h   Duration: 1 week End Date: 11/10  [facility] Care Per Protocol with [employer] Use: Home health RN for IV administration and teaching, line care and labs.    Labs weekly while on IV antibiotics: _x_ CBC with differential __ BMP **TWICE WEEKLY ON VANCOMYCIN  _x_ CMP _x_ CRP __ ESR __ Vancomycin trough TWICE WEEKLY __ CK  __ Please pull PIC at completion of IV antibiotics _x_ Please leave PIC in place until doctor has seen patient or been notified  Fax weekly labs to [phone]  Clinic Follow Up Appt: 11/8  @ [facility] with Nadara Eaton  Microbiology:   Antibiotics: Piperacillin/tazobactam 10/24-present Metronidazole 10/25 Ceftriaxone 10/25  Cultures: Blood 10/24 no growth Urine  Other   HPI: AYLEAH RUSCIO is a 42 y.o. female with past medical history of heart failure with preserved ejection fraction, diabetes, hypertension, hyperlipidemia, OSA/OHS on CPAP, obesity initially presented to the ED with left lower quadrant pain in the setting of recently diagnosed diverticulitis  on Augmentin.  She was seen by PCP on 10/18 with CTAP on 10/21 showed diverticulitis with possible subcutaneous fluid collection invading the bladder.  On arrival to the ED patient had temp of 102.5 WBC 9.4K.  CT abdomen pelvis showed acute sigmoid diverticulitis with poorly defined extraluminal collection extending inferior to the inflamed/colon appeared in the dome of bladder suspicion of abscess measuring 5.6 X6.9X 3.2 cm.  Bladder wall thickening.  Started on pip-tazo.  General surgery was consulted medical management at that time.  IR evaluation for drain placement.  IR noted no clear window for percutaneous abscess.  Patient remained afebrile.  Repeat CT showed enlarging 7 phlegmon interposed between sigmoid colon and bladder compatible with developing abscess measuring 6.4 X4.6X 4.8 cm.  Plan was to for close monitoring discharging and pip-tazo.  ID engaged for antibiotic recommendations.   Review of Systems: Review of Systems  All other systems reviewed and are negative.   Past Medical History:  Diagnosis Date   Acute renal failure (ARF) (HCC) 11/2012    multifactorial-likely secondary to ATN in the setting of sepsis and hypotension, and also from rhabdomyolysis   Anemia    Cellulitis    Chronic diastolic CHF (congestive heart failure) (HCC)    Depression    Diabetes mellitus (HCC)    GERD (gastroesophageal reflux disease)    Hyperlipidemia    Hypertension    Hypothyroidism    Morbid obesity with BMI of 70 and over, adult (HCC)    Obesity hypoventilation syndrome (HCC)    OSA (obstructive sleep apnea)    PVC's (premature ventricular contractions)    Recurrent cellulitis of lower leg    LLE, venous insuff   Sepsis (HCC) 11/2012   Secondary to cellulitis   Sleep apnea    Venous insufficiency of leg     Social History   Tobacco Use   Smoking status: Former    Current packs/day: 0.00    Average packs/day: 0.5 packs/day for 8.0 years (4.0 ttl pk-yrs)    Types: Cigarettes     Start date: 2003    Quit date: 2011    Years since quitting: 13.8   Smokeless tobacco: Never  Vaping Use   Vaping status: Never Used  Substance Use Topics   Alcohol use: No    Alcohol/week: 0.0 standard drinks of alcohol   Drug use: No    Family History  Problem Relation Age of Onset   Hypertension Mother    Diabetes Mother    High Cholesterol Mother    Thyroid disease Mother    Sleep apnea Mother    Obesity Mother    Hypertension Father    Heart disease Father        before age 53   High Cholesterol Father    Sleep apnea Father    Obesity Father    Scheduled Meds:  enoxaparin (LOVENOX) injection  100 mg Subcutaneous Daily   feeding supplement  1 Container Oral TID BM   insulin aspart  0-6 Units Subcutaneous TID WC   lamoTRIgine  150 mg Oral QHS   levothyroxine  150 mcg Oral Q0600   modafinil  300 mg Oral Daily   potassium chloride  40 mEq Oral Daily  sertraline  200 mg Oral Daily   Continuous Infusions:  piperacillin-tazobactam (ZOSYN)  IV 3.375 g (09/27/23 2219)   PRN Meds:.acetaminophen **OR** acetaminophen, ketorolac, ondansetron (ZOFRAN) IV, oxyCODONE-acetaminophen, traMADol Allergies  Allergen Reactions   Aspirin Hives and Swelling    REACTION: throat swelling, hives Other reaction(s): Unknown Other reaction(s): Unknown   Doxycycline Other (See Comments)    Abdominal pain, difficulty swallowing   Lisinopril Cough   Niacin And Related     Other reaction(s): Unknown Other reaction(s): Unknown   Niaspan [Niacin Er (Antihyperlipidemic)]     Caused flushing   Wound Dressing Adhesive Itching    Welts and burning of the skin    OBJECTIVE: Blood pressure 137/76, pulse 74, temperature 98.2 F (36.8 C), resp. rate 17, height 5\' 1"  (1.549 m), weight (!) 192.8 kg, last menstrual period 09/08/2023, SpO2 100%.  Physical Exam Constitutional:      Appearance: Normal appearance.  HENT:     Head: Normocephalic and atraumatic.     Right Ear: Tympanic membrane  normal.     Left Ear: Tympanic membrane normal.     Nose: Nose normal.     Mouth/Throat:     Mouth: Mucous membranes are moist.  Eyes:     Extraocular Movements: Extraocular movements intact.     Conjunctiva/sclera: Conjunctivae normal.     Pupils: Pupils are equal, round, and reactive to light.  Cardiovascular:     Rate and Rhythm: Normal rate and regular rhythm.     Heart sounds: No murmur heard.    No friction rub. No gallop.  Pulmonary:     Effort: Pulmonary effort is normal.     Breath sounds: Normal breath sounds.  Abdominal:     General: Abdomen is flat.     Palpations: Abdomen is soft.  Musculoskeletal:        General: Normal range of motion.  Skin:    General: Skin is warm and dry.  Neurological:     General: No focal deficit present.     Mental Status: She is alert and oriented to person, place, and time.  Psychiatric:        Mood and Affect: Mood normal.     Lab Results Lab Results  Component Value Date   WBC 5.2 09/27/2023   HGB 8.5 (L) 09/27/2023   HCT 28.2 (L) 09/27/2023   MCV 87.0 09/27/2023   PLT 243 09/27/2023    Lab Results  Component Value Date   CREATININE 1.16 (H) 09/27/2023   BUN 15 09/27/2023   NA 139 09/27/2023   K 3.4 (L) 09/27/2023   CL 101 09/27/2023   CO2 25 09/27/2023    Lab Results  Component Value Date   ALT 15 09/27/2023   AST 12 (L) 09/27/2023   ALKPHOS 79 09/27/2023   BILITOT 0.3 09/27/2023       Danelle Earthly, MD [facility] 09/28/2023, 4:37 AM I have personally spent 82 minutes involved in face-to-face and non-face-to-face activities for this patient on the day of the visit. Professional time spent includes the following activities: Preparing to see the patient (review of tests), Obtaining and/or reviewing separately obtained history (admission/discharge record), Performing a medically appropriate examination and/or evaluation , Ordering medications/tests/procedures,  referring and communicating with other health care professionals, Documenting clinical information in the EMR, Independently interpreting results (not separately reported), Communicating results to the patient/family/caregiver, Counseling and educating the patient/family/caregiver and Care coordination (not separately reported).

## 2023-09-28 NOTE — Plan of Care (Signed)
Problem: Education: Goal: Knowledge of General Education information will improve Description: Including pain rating scale, medication(s)/side effects and non-pharmacologic comfort measures 09/28/2023 1814 by Loetta Rough, LPN Outcome: Adequate for Discharge 09/28/2023 1516 by Loetta Rough, LPN Outcome: Adequate for Discharge   Problem: Health Behavior/Discharge Planning: Goal: Ability to manage health-related needs will improve 09/28/2023 1814 by Loetta Rough, LPN Outcome: Adequate for Discharge 09/28/2023 1516 by Loetta Rough, LPN Outcome: Adequate for Discharge   Problem: Clinical Measurements: Goal: Ability to maintain clinical measurements within normal limits will improve 09/28/2023 1814 by Loetta Rough, LPN Outcome: Adequate for Discharge 09/28/2023 1516 by Loetta Rough, LPN Outcome: Adequate for Discharge Goal: Will remain free from infection 09/28/2023 1814 by Loetta Rough, LPN Outcome: Adequate for Discharge 09/28/2023 1516 by Loetta Rough, LPN Outcome: Adequate for Discharge Goal: Diagnostic test results will improve 09/28/2023 1814 by Loetta Rough, LPN Outcome: Adequate for Discharge 09/28/2023 1516 by Loetta Rough, LPN Outcome: Adequate for Discharge Goal: Respiratory complications will improve 09/28/2023 1814 by Loetta Rough, LPN Outcome: Adequate for Discharge 09/28/2023 1516 by Loetta Rough, LPN Outcome: Adequate for Discharge Goal: Cardiovascular complication will be avoided 09/28/2023 1814 by Loetta Rough, LPN Outcome: Adequate for Discharge 09/28/2023 1516 by Loetta Rough, LPN Outcome: Adequate for Discharge   Problem: Activity: Goal: Risk for activity intolerance will decrease 09/28/2023 1814 by Loetta Rough, LPN Outcome: Adequate for Discharge 09/28/2023 1516 by Loetta Rough, LPN Outcome: Adequate for Discharge   Problem: Nutrition: Goal: Adequate nutrition will be  maintained 09/28/2023 1814 by Loetta Rough, LPN Outcome: Adequate for Discharge 09/28/2023 1516 by Loetta Rough, LPN Outcome: Adequate for Discharge   Problem: Coping: Goal: Level of anxiety will decrease 09/28/2023 1814 by Loetta Rough, LPN Outcome: Adequate for Discharge 09/28/2023 1516 by Loetta Rough, LPN Outcome: Adequate for Discharge   Problem: Elimination: Goal: Will not experience complications related to bowel motility 09/28/2023 1814 by Loetta Rough, LPN Outcome: Adequate for Discharge 09/28/2023 1516 by Loetta Rough, LPN Outcome: Adequate for Discharge Goal: Will not experience complications related to urinary retention 09/28/2023 1814 by Loetta Rough, LPN Outcome: Adequate for Discharge 09/28/2023 1516 by Loetta Rough, LPN Outcome: Adequate for Discharge   Problem: Pain Management: Goal: General experience of comfort will improve 09/28/2023 1814 by Loetta Rough, LPN Outcome: Adequate for Discharge 09/28/2023 1516 by Loetta Rough, LPN Outcome: Adequate for Discharge   Problem: Safety: Goal: Ability to remain free from injury will improve 09/28/2023 1814 by Loetta Rough, LPN Outcome: Adequate for Discharge 09/28/2023 1516 by Loetta Rough, LPN Outcome: Adequate for Discharge   Problem: Skin Integrity: Goal: Risk for impaired skin integrity will decrease 09/28/2023 1814 by Loetta Rough, LPN Outcome: Adequate for Discharge 09/28/2023 1516 by Loetta Rough, LPN Outcome: Adequate for Discharge   Problem: Education: Goal: Ability to describe self-care measures that may prevent or decrease complications (Diabetes Survival Skills Education) will improve 09/28/2023 1814 by Loetta Rough, LPN Outcome: Adequate for Discharge 09/28/2023 1516 by Loetta Rough, LPN Outcome: Adequate for Discharge Goal: Individualized Educational Video(s) 09/28/2023 1814 by Loetta Rough, LPN Outcome: Adequate for  Discharge 09/28/2023 1516 by Loetta Rough, LPN Outcome: Adequate for Discharge   Problem: Coping: Goal: Ability to adjust to condition or change in health will improve 09/28/2023 1814 by Loetta Rough, LPN Outcome: Adequate for Discharge 09/28/2023 1516 by Loetta Rough, LPN Outcome: Adequate for Discharge   Problem: Fluid Volume: Goal: Ability to maintain a balanced intake and output will improve 09/28/2023 1814 by Loetta Rough, LPN Outcome: Adequate for Discharge 09/28/2023 1516 by Loetta Rough,  LPN Outcome: Adequate for Discharge   Problem: Health Behavior/Discharge Planning: Goal: Ability to identify and utilize available resources and services will improve 09/28/2023 1814 by Loetta Rough, LPN Outcome: Adequate for Discharge 09/28/2023 1516 by Loetta Rough, LPN Outcome: Adequate for Discharge Goal: Ability to manage health-related needs will improve 09/28/2023 1814 by Loetta Rough, LPN Outcome: Adequate for Discharge 09/28/2023 1516 by Loetta Rough, LPN Outcome: Adequate for Discharge   Problem: Metabolic: Goal: Ability to maintain appropriate glucose levels will improve 09/28/2023 1814 by Loetta Rough, LPN Outcome: Adequate for Discharge 09/28/2023 1516 by Loetta Rough, LPN Outcome: Adequate for Discharge   Problem: Nutritional: Goal: Maintenance of adequate nutrition will improve 09/28/2023 1814 by Loetta Rough, LPN Outcome: Adequate for Discharge 09/28/2023 1516 by Loetta Rough, LPN Outcome: Adequate for Discharge Goal: Progress toward achieving an optimal weight will improve 09/28/2023 1814 by Loetta Rough, LPN Outcome: Adequate for Discharge 09/28/2023 1516 by Loetta Rough, LPN Outcome: Adequate for Discharge   Problem: Skin Integrity: Goal: Risk for impaired skin integrity will decrease 09/28/2023 1814 by Loetta Rough, LPN Outcome: Adequate for Discharge 09/28/2023 1516 by Loetta Rough,  LPN Outcome: Adequate for Discharge   Problem: Tissue Perfusion: Goal: Adequacy of tissue perfusion will improve 09/28/2023 1814 by Loetta Rough, LPN Outcome: Adequate for Discharge 09/28/2023 1516 by Loetta Rough, LPN Outcome: Adequate for Discharge

## 2023-10-01 ENCOUNTER — Other Ambulatory Visit: Payer: Self-pay | Admitting: Adult Health

## 2023-10-01 DIAGNOSIS — F331 Major depressive disorder, recurrent, moderate: Secondary | ICD-10-CM

## 2023-10-01 NOTE — Telephone Encounter (Signed)
Please call to schedule an appt. Did not respond to MyChart message or note on last RF.

## 2023-10-02 ENCOUNTER — Other Ambulatory Visit: Payer: Self-pay

## 2023-10-02 ENCOUNTER — Other Ambulatory Visit (HOSPITAL_COMMUNITY): Payer: Self-pay

## 2023-10-02 ENCOUNTER — Telehealth: Payer: Self-pay | Admitting: Internal Medicine

## 2023-10-02 NOTE — Telephone Encounter (Signed)
Pam with [facility] called states they are trying to get a CT done for the patient this week following her hospital visit from last week. They state after speaking with insurance they would like [place] to cancel the two CT orders we have for the patient since they were ordered prior to her going to the hospital and she was never able to get the CT done so insurance will approve the order. If any questions the best callback is [phone].

## 2023-10-02 NOTE — Telephone Encounter (Signed)
Lvm for pt to call and schedule

## 2023-10-02 NOTE — Addendum Note (Signed)
Addended by: Racheal Patches on: 10/02/2023 12:27 PM   Modules accepted: Orders

## 2023-10-03 ENCOUNTER — Other Ambulatory Visit: Payer: Self-pay

## 2023-10-03 MED ORDER — LAMOTRIGINE 150 MG PO TABS
150.0000 mg | ORAL_TABLET | Freq: Every day | ORAL | 0 refills | Status: DC
  Filled 2023-10-03: qty 30, 30d supply, fill #0

## 2023-10-03 NOTE — Telephone Encounter (Signed)
Patient last seen in May, was due for FU in 4 weeks. She is past due, has not responded to MyChart message or phone call to schedule FU.

## 2023-10-04 ENCOUNTER — Other Ambulatory Visit: Payer: Self-pay

## 2023-10-04 ENCOUNTER — Other Ambulatory Visit (HOSPITAL_COMMUNITY): Payer: Self-pay | Admitting: Physician Assistant

## 2023-10-04 ENCOUNTER — Telehealth: Payer: Self-pay

## 2023-10-04 DIAGNOSIS — K5720 Diverticulitis of large intestine with perforation and abscess without bleeding: Secondary | ICD-10-CM

## 2023-10-04 NOTE — Transitions of Care (Post Inpatient/ED Visit) (Signed)
   10/04/2023  Name: Alexandra Beck MRN: [id number] DOB: May 31, 1981  Today's TOC FU Call Status: Today's TOC FU Call Status:: Unsuccessful Call (1st Attempt) Unsuccessful Call (1st Attempt) Date: 10/04/23  Attempted to reach the patient regarding the most recent Inpatient/ED visit.  Follow Up Plan: Additional outreach attempts will be made to reach the patient to complete the Transitions of Care (Post Inpatient/ED visit) call.   [name] BSN, RN RN Care Manager   Transitions of Care [facility] - [facility] Direct Dial Number:  [phone]

## 2023-10-05 ENCOUNTER — Other Ambulatory Visit: Payer: Self-pay

## 2023-10-05 ENCOUNTER — Ambulatory Visit (HOSPITAL_COMMUNITY)
Admission: RE | Admit: 2023-10-05 | Discharge: 2023-10-05 | Disposition: A | Discharge status: Home / Self Care | Payer: BC Managed Care – PPO | Source: Ambulatory Visit | Attending: Physician Assistant | Admitting: Physician Assistant

## 2023-10-05 DIAGNOSIS — K5720 Diverticulitis of large intestine with perforation and abscess without bleeding: Secondary | ICD-10-CM | POA: Diagnosis present

## 2023-10-05 MED ORDER — IOHEXOL 350 MG/ML SOLN
75.0000 mL | Freq: Once | INTRAVENOUS | Status: AC | PRN
  Administered 2023-10-05: 75 mL via INTRAVENOUS

## 2023-10-06 ENCOUNTER — Other Ambulatory Visit: Payer: Self-pay

## 2023-10-06 ENCOUNTER — Ambulatory Visit (INDEPENDENT_AMBULATORY_CARE_PROVIDER_SITE_OTHER): Payer: BC Managed Care – PPO | Admitting: Family

## 2023-10-06 ENCOUNTER — Other Ambulatory Visit (HOSPITAL_COMMUNITY): Payer: Self-pay

## 2023-10-06 ENCOUNTER — Encounter: Payer: Self-pay | Admitting: Family

## 2023-10-06 ENCOUNTER — Telehealth: Payer: Self-pay | Admitting: Physician Assistant

## 2023-10-06 VITALS — BP 131/72 | HR 72 | Temp 97.8°F | Ht 62.5 in | Wt >= 6400 oz

## 2023-10-06 DIAGNOSIS — K5720 Diverticulitis of large intestine with perforation and abscess without bleeding: Secondary | ICD-10-CM | POA: Diagnosis not present

## 2023-10-06 MED ORDER — LOSARTAN POTASSIUM 100 MG PO TABS
100.0000 mg | ORAL_TABLET | Freq: Every day | ORAL | 0 refills | Status: DC
  Filled 2023-10-06: qty 30, 30d supply, fill #0

## 2023-10-06 NOTE — Telephone Encounter (Signed)
*  STAT* If patient is at the pharmacy, call can be transferred to refill team.   1. Which medications need to be refilled? (please list name of each medication and dose if known)   losartan (COZAAR) 100 MG tablet     2. Would you like to learn more about the convenience, safety, & potential cost savings by using the [facility]? Already using    3. Are you open to using the [facility] (Type [facility]. Already using   4. Which pharmacy/location (including street and city if local pharmacy) is medication to be sent to?  [place] [facility]     5. Do they need a 30 day or 90 day supply? Needs enough medication to last her until her 11/15 appointment  Will be out of medication on Monday 11/11.

## 2023-10-06 NOTE — Patient Instructions (Addendum)
Nice to see you.  Continue to take your antibiotics as prescribed  Await CT scan results.  Continue to follow up with General Surgery.   Follow up in 3 weeks   Have a great day and stay safe!

## 2023-10-06 NOTE — Telephone Encounter (Signed)
Pt's medication was sent to pt's pharmacy as requested. Confirmation received.  °

## 2023-10-06 NOTE — Progress Notes (Signed)
Subjective:    Patient ID: Alexandra Beck, female    DOB: 10-02-1981, 42 y.o.   MRN: [id number]  Chief Complaint  Patient presents with   Hospitalization Follow-up    HPI:  Alexandra Beck is a 42 y.o. female recently hospitalized with intra-abdominal abscess and last seen by Dr. Thedore Mins on 09/28/23 with plan for antibiotic treatment with ciprofloxacin and metronidazole. CT on 10/29 showed enlarging phlegmon between the sigmoid colon and bladder.  Blood cultures were without growth. Plan was for potential transfer to another facility and close monitoring. CT imaging obtained 10/05/23 with results pending. [facility] recommending follow up with [facility] with next appointment for 11/14 with Dr. Lucretia Roers. Here today for hospitalization follow up.   Alexandra Beck has been doing okay since leaving the hospital and has been taking her ciprofloxacin and metronidazole as prescribed. Has had bloody bowel movements since leaving the hospital. Currently awaiting General Surgery appointment at [facility] .   Allergies  Allergen Reactions   Aspirin Hives and Swelling    REACTION: throat swelling, hives Other reaction(s): Unknown Other reaction(s): Unknown   Doxycycline Other (See Comments)    Abdominal pain, difficulty swallowing   Lisinopril Cough   Niacin And Related     Other reaction(s): Unknown Other reaction(s): Unknown   Niaspan [Niacin Er (Antihyperlipidemic)]     Caused flushing   Wound Dressing Adhesive Itching    Welts and burning of the skin      Outpatient Medications Prior to Visit  Medication Sig Dispense Refill   ciprofloxacin (CIPRO) 750 MG tablet Take 1 tablet (750 mg total) by mouth 2 (two) times daily for 28 days. 56 tablet 0   ibuprofen (ADVIL) 200 MG tablet Take 800 mg by mouth daily as needed for headache or moderate pain (pain score 4-6).     lamoTRIgine (LAMICTAL) 150 MG tablet Take 1 tablet (150 mg total) by mouth daily. 30 tablet 0   levothyroxine (SYNTHROID) 150  MCG tablet Take 1 tablet (150 mcg total) by mouth daily before breakfast. Must keep scheduled appt for future refills 90 tablet 3   losartan (COZAAR) 100 MG tablet Take 100 mg by mouth daily.     metFORMIN (GLUCOPHAGE) 500 MG tablet Take 1 tablet (500 mg total) by mouth daily. Must keep scheduled appt for future refills 90 tablet 3   metroNIDAZOLE (FLAGYL) 500 MG tablet Take 1 tablet (500 mg total) by mouth every 12 (twelve) hours. 28 tablet 0   modafinil (PROVIGIL) 100 MG tablet Take 1 tablet (100mg  total) by mouth daily at 9am (along with 200 mg tablet daily at 9am for a total of 300 mg daily) (Patient taking differently: Take 100 mg by mouth daily. Take daily at 1230 with a 200mg  tablet.) 60 tablet 3   modafinil (PROVIGIL) 200 MG tablet Take 1 tablet by mouth daily at 9am (along with 100 mg tablet daily at 9am (total 300mg )) (Patient taking differently: Take 200 mg by mouth daily. Take 1 tablet by mouth daily at 1230 with a 100mg  tablet) 60 tablet 3   rosuvastatin (CRESTOR) 20 MG tablet Take 1 tablet (20 mg total) by mouth daily. 90 tablet 3   Semaglutide, 2 MG/DOSE, 8 MG/3ML SOPN Inject 2 mg into the skin as directed once a week. (Patient taking differently: Inject 2 mg as directed once a week. Take on Fridays at 1230) 9 mL 0   sertraline (ZOLOFT) 100 MG tablet Take 2 tablets (200 mg total) by mouth daily.  180 tablet 3   spironolactone (ALDACTONE) 25 MG tablet Take 1 tablet (25 mg total) by mouth daily. 90 tablet 3   torsemide (DEMADEX) 20 MG tablet Take 4 tablets (80 mg total) by mouth 2 (two) times daily as needed. (Patient taking differently: Take 120 mg by mouth daily.) 720 tablet 3   traMADol (ULTRAM) 50 MG tablet Take 1 tablet (50 mg total) by mouth every 8 (eight) hours as needed. 40 tablet 1   triamcinolone cream (KENALOG) 0.1 % Apply 1 Application topically 2 (two) times daily. (Patient taking differently: Apply 1 Application topically daily as needed (Apply to hands).) 30 g 1   Vitamin D,  Ergocalciferol, (DRISDOL) 1.25 MG (50000 UNIT) CAPS capsule Take 1 capsule (50,000 Units total) by mouth every 7 (seven) days. (Patient taking differently: Take 50,000 Units by mouth once a week. Take on Sundays at 1230) 4 capsule 0   No facility-administered medications prior to visit.     Past Medical History:  Diagnosis Date   Acute renal failure (ARF) (HCC) 11/2012    multifactorial-likely secondary to ATN in the setting of sepsis and hypotension, and also from rhabdomyolysis   Anemia    Cellulitis    Chronic diastolic CHF (congestive heart failure) (HCC)    Depression    Diabetes mellitus (HCC)    GERD (gastroesophageal reflux disease)    Hyperlipidemia    Hypertension    Hypothyroidism    Morbid obesity with BMI of 70 and over, adult (HCC)    Obesity hypoventilation syndrome (HCC)    OSA (obstructive sleep apnea)    PVC's (premature ventricular contractions)    Recurrent cellulitis of lower leg    LLE, venous insuff   Sepsis (HCC) 11/2012   Secondary to cellulitis   Sleep apnea    Venous insufficiency of leg      Past Surgical History:  Procedure Laterality Date   IR FLUORO GUIDE CV MIDLINE PICC RIGHT  03/28/2017   IR US GUIDE VASC ACCESS RIGHT  03/28/2017   WISDOM TOOTH EXTRACTION         Review of Systems  Constitutional:  Negative for chills, diaphoresis, fatigue and fever.  Respiratory:  Negative for cough, chest tightness, shortness of breath and wheezing.   Cardiovascular:  Negative for chest pain.  Gastrointestinal:  Positive for abdominal pain and blood in stool. Negative for diarrhea, nausea and vomiting.      Objective:    BP 131/72   Pulse 72   Temp 97.8 F (36.6 C) (Temporal)   Ht 5' 2.5" (1.588 m)   Wt (!) 419 lb (190.1 kg)   LMP 09/08/2023   SpO2 95%   BMI 75.41 kg/m  Nursing note and vital signs reviewed.  Physical Exam Constitutional:      General: She is not in acute distress.    Appearance: She is well-developed. She is obese.   Cardiovascular:     Rate and Rhythm: Normal rate and regular rhythm.     Heart sounds: Normal heart sounds.  Pulmonary:     Effort: Pulmonary effort is normal.     Breath sounds: Normal breath sounds.  Skin:    General: Skin is warm and dry.  Neurological:     Mental Status: She is alert.         06/30/2023    1:56 PM 09/23/2022   10:54 AM 06/17/2022    1:44 PM 09/22/2021    2:10 PM 09/15/2021    3:22 PM  Depression screen  PHQ 2/9  Decreased Interest 1 0 1 0 0  Down, Depressed, Hopeless 1 0 1 1 1   PHQ - 2 Score 2 0 2 1 1   Altered sleeping 0 0 0    Tired, decreased energy 2 0 2    Change in appetite 1 0 1    Feeling bad or failure about yourself  1 0 1    Trouble concentrating 0 0 0    Moving slowly or fidgety/restless 0 0 0    Suicidal thoughts 0 0 0    PHQ-9 Score 6 0 6    Difficult doing work/chores  Not difficult at all Somewhat difficult         Assessment & Plan:    Patient Active Problem List   Diagnosis Date Noted   Diverticulitis of large intestine with abscess 09/21/2023   Diarrhea 09/24/2022   Abdominal pain 09/23/2022   Encounter for well adult exam with abnormal findings 06/17/2022   Bilateral hearing loss 06/17/2022   Anemia 06/17/2022   Well woman exam 06/17/2022   Fever 05/14/2022   Hypokalemia 05/14/2022   Lightheadedness 05/14/2022   Shortness of breath 05/14/2022   Pneumonia 05/13/2022   Eczema, dyshidrotic 09/19/2021   Chronic pain 09/19/2021   Cellulitis and abscess of neck 05/14/2021   Diabetes mellitus (HCC) 10/19/2020   Psoriasis 12/26/2019   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   Hypersomnolence 05/11/2018   Vitamin D deficiency 05/02/2018   Cellulitis of left leg 04/17/2017   Cellulitis of right leg 01/11/2017   PVC's (premature ventricular contractions) 12/02/2016   Chronic diastolic CHF (congestive heart failure) (HCC)    Hyperlipidemia associated with type 2 diabetes mellitus  (HCC)    Type 2 diabetes mellitus with obesity (HCC)    Hypothyroidism    GERD (gastroesophageal reflux disease)    Depression    Venous insufficiency of leg    Recurrent cellulitis of lower leg 12/25/2012   OSA (obstructive sleep apnea) 12/25/2012   Obesity hypoventilation syndrome (HCC) 12/25/2012   Morbid obesity (HCC) 12/24/2012   Hypertension associated with diabetes (HCC) 06/26/2007     Problem List Items Addressed This Visit       Digestive   Diverticulitis of large intestine with abscess - Primary    Alexandra Beck continues to take her ciprofloxacin and metronidazole as prescribed. Awaiting updated CT abomen/pelvis results from yesterday. Having bloody bowel movements. Cannot rule out from Cirpofloxacin but in the setting of diverticulitis and abscess will continue for now as there is no other active bleeding. Has follow up with General Surgery at [facility] for evaluation of any potential surgical interventions. Continue broad spectrum coverage for intra-abdominal abscess with ciprofloxacin and metronidazole. Plan for follow up in 3 weeks or sooner if needed.         I am having Alexandra Beck maintain her ibuprofen, torsemide, sertraline, triamcinolone cream, traMADol, modafinil, modafinil, levothyroxine, metFORMIN, spironolactone, rosuvastatin, Semaglutide (2 MG/DOSE), Vitamin D (Ergocalciferol), ciprofloxacin, metroNIDAZOLE, lamoTRIgine, and losartan.    Follow-up: Return in about 3 weeks (around 10/27/2023), or if symptoms worsen or fail to improve. or sooner if needed.   Marcos Eke, MSN, FNP-C Nurse Practitioner [facility] Main number: [phone]

## 2023-10-06 NOTE — Assessment & Plan Note (Signed)
Alexandra Beck continues to take her ciprofloxacin and metronidazole as prescribed. Awaiting updated CT abomen/pelvis results from yesterday. Having bloody bowel movements. Cannot rule out from Cirpofloxacin but in the setting of diverticulitis and abscess will continue for now as there is no other active bleeding. Has follow up with General Surgery at [facility] for evaluation of any potential surgical interventions. Continue broad spectrum coverage for intra-abdominal abscess with ciprofloxacin and metronidazole. Plan for follow up in 3 weeks or sooner if needed.

## 2023-10-07 ENCOUNTER — Other Ambulatory Visit (HOSPITAL_COMMUNITY): Payer: Self-pay

## 2023-10-09 ENCOUNTER — Other Ambulatory Visit: Payer: Self-pay

## 2023-10-10 ENCOUNTER — Other Ambulatory Visit (HOSPITAL_COMMUNITY): Payer: Self-pay

## 2023-10-10 ENCOUNTER — Other Ambulatory Visit: Payer: Self-pay

## 2023-10-10 MED ORDER — METRONIDAZOLE 500 MG PO TABS
500.0000 mg | ORAL_TABLET | Freq: Two times a day (BID) | ORAL | 0 refills | Status: DC
  Filled 2023-10-10: qty 60, 30d supply, fill #0

## 2023-10-11 ENCOUNTER — Ambulatory Visit (INDEPENDENT_AMBULATORY_CARE_PROVIDER_SITE_OTHER): Payer: BC Managed Care – PPO | Admitting: Psychology

## 2023-10-11 DIAGNOSIS — F331 Major depressive disorder, recurrent, moderate: Secondary | ICD-10-CM

## 2023-10-11 DIAGNOSIS — F428 Other obsessive-compulsive disorder: Secondary | ICD-10-CM

## 2023-10-11 NOTE — Progress Notes (Addendum)
Cardiology Office Note    Date:  10/13/2023  ID:  Alexandra Beck, DOB November 04, 1981, MRN [id number] PCP:  Corwin Levins, MD  Cardiologist:  Lance Muss, MD  Electrophysiologist:  None   Chief Complaint: f/u CHF  History of Present Illness: .    Alexandra Beck is a 42 y.o. female with visit-pertinent history of DM2, HLD (not followed by cardiology), severe morbid obesity (BMI 75) with suspected OHS/OSA on CPAP, hypothyroidism, chronic HFpEF, recurrent cellulitis, depression, anemia back to 2022, recent admission for intra-abdominal abscess presents for cardiac follow-up. She has followed with Dr. Eldridge Dace for history of HFpEF. Last echo 04/2022 showed EF 55-60%, normal RV, mild LAE, technically difficult due to habitus. She had remote ETT 2018 which was nondiagnostic due to failure to achieve THR, poor exercise tolerance with TWI at rest not changed with stress; occ PVCs. PVCs have otherwise not been a recent issue.  She is seen for follow-up today. She reports during her hospital stay her losartan, spironolactone and torsemide were all held and by the end of her stay she felt like she was retaining fluid so resumed them upon return home. Her renal function has been variable in the past most recently with Cr ranging from 1-1.4 in the last 2 years, was 1.02 at DC and peak 1.5. Her torsemide is written as 40mg  BID PRN. She reports she and Dr. Eldridge Dace previously settled on a regimen of taking a fixed dose in the morning then additional PRN in the afternoon if needed. She historically struggles taking it twice a day so has been doing 6 tablets (120mg ) once a day. She is still having some GI issues but followed up with colorectal surgery at [facility] and reports she was not felt to require GI surgery. He reports they are referring her to consider bariatric surgery but under the notion that she may be higher risk for this given issues with her GI tract. She also follows with [facility] and though she has been on  Ozempic for quite a while feels that she has not had the robust response to this as expected. Her wife is on Monjouro and has seen better results. She herself was on Monjouro a while ago and did well but then her insurance stopped covering.   Labwork independently reviewed: 08/2023 K 4.2, Cr 1.02, albumin 2.6, AST ALT OK, Hgb 8.5, plt OK, A1c 6.0 02/2023 TSH OK, LDL 91, trig 158 ([facility])  ROS: .    Please see the history of present illness. All other systems are reviewed and otherwise negative.  Studies Reviewed: Marland Kitchen    EKG:  EKG is  not ordered today but reviewed from previous -  NSR, low voltage QRS otherwise no cute changes  CV Studies: Cardiac studies reviewed are outlined and summarized above. Otherwise please see EMR for full report.   Current Reported Medications:.    Current Meds  Medication Sig   ciprofloxacin (CIPRO) 750 MG tablet Take 1 tablet (750 mg total) by mouth 2 (two) times daily for 28 days.   ibuprofen (ADVIL) 200 MG tablet Take 800 mg by mouth daily as needed for headache or moderate pain (pain score 4-6).   lamoTRIgine (LAMICTAL) 150 MG tablet Take 1 tablet (150 mg total) by mouth daily.   levothyroxine (SYNTHROID) 150 MCG tablet Take 1 tablet (150 mcg total) by mouth daily before breakfast. Must keep scheduled appt for future refills   losartan (COZAAR) 100 MG tablet Take 1 tablet (100 mg total) by  mouth daily.   metFORMIN (GLUCOPHAGE) 500 MG tablet Take 1 tablet (500 mg total) by mouth daily. Must keep scheduled appt for future refills   metroNIDAZOLE (FLAGYL) 500 MG tablet Take 1 tablet (500 mg total) by mouth every 12 (twelve) hours.   modafinil (PROVIGIL) 100 MG tablet Take 1 tablet (100mg  total) by mouth daily at 9am (along with 200 mg tablet daily at 9am for a total of 300 mg daily) (Patient taking differently: Take 100 mg by mouth daily. Take daily at 1230 with a 200mg  tablet.)   modafinil (PROVIGIL) 200 MG tablet Take 1 tablet by mouth daily at 9am (along  with 100 mg tablet daily at 9am (total 300mg )) (Patient taking differently: Take 200 mg by mouth daily. Take 1 tablet by mouth daily at 1230 with a 100mg  tablet)   rosuvastatin (CRESTOR) 20 MG tablet Take 1 tablet (20 mg total) by mouth daily.   Semaglutide, 2 MG/DOSE, 8 MG/3ML SOPN Inject 2 mg into the skin as directed once a week. (Patient taking differently: Inject 2 mg as directed once a week. Take on Fridays at 1230)   sertraline (ZOLOFT) 100 MG tablet Take 2 tablets (200 mg total) by mouth daily.   spironolactone (ALDACTONE) 25 MG tablet Take 1 tablet (25 mg total) by mouth daily.   torsemide (DEMADEX) 20 MG tablet Take 4 tablets (80 mg total) by mouth 2 (two) times daily as needed. (Patient taking differently: Take 120 mg by mouth daily.)   traMADol (ULTRAM) 50 MG tablet Take 1 tablet (50 mg total) by mouth every 8 (eight) hours as needed.   triamcinolone cream (KENALOG) 0.1 % Apply 1 Application topically 2 (two) times daily. (Patient taking differently: Apply 1 Application topically daily as needed (Apply to hands).)   Vitamin D, Ergocalciferol, (DRISDOL) 1.25 MG (50000 UNIT) CAPS capsule Take 1 capsule (50,000 Units total) by mouth every 7 (seven) days. (Patient taking differently: Take 50,000 Units by mouth once a week. Take on Sundays at 1230)    Physical Exam:    VS:  BP 130/69   Pulse 68   Ht 5' 2.5" (1.588 m)   Wt (!) 422 lb (191.4 kg)   LMP 09/08/2023   BMI 75.95 kg/m    Wt Readings from Last 3 Encounters:  10/13/23 (!) 422 lb (191.4 kg)  10/06/23 (!) 419 lb (190.1 kg)  09/28/23 (!) 425 lb 0.8 oz (192.8 kg)    GEN: Well nourished, well developed in no acute distress NECK: No JVD; No carotid bruits CARDIAC: RRR, no murmurs, rubs, gallops RESPIRATORY:  Clear to auscultation without rales, wheezing or rhonchi  ABDOMEN: Soft, non-tender, non-distended EXTREMITIES:  No edema; No acute deformity   Asessement and Plan:.    1. Chronic HFpEF, HTN - she reports sense of volume  gain during her recent hospital stay in the setting of holding her usual HF medications - losartan, spironolactone, torsemide. She is now back on this regimen and taking torsemide 120mg  daily. Her hospital weights trended 414 -> 416 -> 417-> 420 -> 425 -> now 422lb. She was 417lb in 05/2023, 420s-440s earlier 2024 so dry weight is moving target. She is feeling a little bit better day by day from GI sense. She is still having to eat a liquid/soft diet. She historically has not liked to take torsemide in BID fashion but is open to the idea short term if needed. She was very remotely on metolazone. I will check BMET, CBC today and consider short term increase in  diuretic if K, Cr will allow.  2. Anemia - recheck CBC today.  3. Severe morbid obesity with suspected OHS/OSA on CPAP - adherent to CPAP. Continue to follow with [facility].   4. HLD associated with DM - appears her most recent lipids have been checked by her [facility] team and primary care is prescribing her rosuvastatin. Last lipids 02/2023. Continue ongoing f/u with PCP.    Disposition: F/u with me in 2-3 months. Would recommend to establish with Dr. Cristal Deer in 6 months as new primary cardiologist.  Signed, Laurann Montana, PA-C

## 2023-10-11 NOTE — Progress Notes (Unsigned)
                Alexandra Rosenberg G Stephane Junkins, LCSW

## 2023-10-13 ENCOUNTER — Ambulatory Visit: Payer: BC Managed Care – PPO | Attending: Physician Assistant | Admitting: Physician Assistant

## 2023-10-13 ENCOUNTER — Ambulatory Visit: Payer: BC Managed Care – PPO | Admitting: Internal Medicine

## 2023-10-13 ENCOUNTER — Encounter: Payer: Self-pay | Admitting: Physician Assistant

## 2023-10-13 VITALS — BP 130/69 | HR 68 | Ht 62.5 in | Wt >= 6400 oz

## 2023-10-13 DIAGNOSIS — G4733 Obstructive sleep apnea (adult) (pediatric): Secondary | ICD-10-CM

## 2023-10-13 DIAGNOSIS — Z6841 Body Mass Index (BMI) 40.0 and over, adult: Secondary | ICD-10-CM

## 2023-10-13 DIAGNOSIS — I5032 Chronic diastolic (congestive) heart failure: Secondary | ICD-10-CM

## 2023-10-13 DIAGNOSIS — E785 Hyperlipidemia, unspecified: Secondary | ICD-10-CM

## 2023-10-13 DIAGNOSIS — D649 Anemia, unspecified: Secondary | ICD-10-CM

## 2023-10-13 DIAGNOSIS — E1169 Type 2 diabetes mellitus with other specified complication: Secondary | ICD-10-CM

## 2023-10-13 NOTE — Patient Instructions (Signed)
Medication Instructions:   Your physician recommends that you continue on your current medications as directed. Please refer to the Current Medication list given to you today.   *If you need a refill on your cardiac medications before your next appointment, please call your pharmacy*   Lab Work:  TODAY!!!! BMET/CBC  If you have labs (blood work) drawn today and your tests are completely normal, you will receive your results only by: MyChart Message (if you have MyChart) OR A paper copy in the mail If you have any lab test that is abnormal or we need to change your treatment, we will call you to review the results.   Testing/Procedures:  None ordered.   Follow-Up: At [facility], you and your health needs are our priority.  As part of our continuing mission to provide you with exceptional heart care, we have created designated Provider Care Teams.  These Care Teams include your primary Cardiologist (physician) and Advanced Practice Providers (APPs -  Physician Assistants and Nurse Practitioners) who all work together to provide you with the care you need, when you need it.  We recommend signing up for the patient portal called "MyChart".  Sign up information is provided on this After Visit Summary.  MyChart is used to connect with patients for Virtual Visits (Telemedicine).  Patients are able to view lab/test results, encounter notes, upcoming appointments, etc.  Non-urgent messages can be sent to your provider as well.   To learn more about what you can do with MyChart, go to [email or web address].    Your next appointment:   3 month(s)  Provider:   Ronie Spies, PA-C     Then, Jodelle Red, MD will plan to see you again in 3 month(s).    Your physician wants you to follow-up in: 6 months. You will receive a reminder letter in the mail two months in advance. If you don't receive a letter, please call our office to schedule the follow-up appointment.

## 2023-10-14 LAB — CBC
Hematocrit: 28.5 % — ABNORMAL LOW (ref 34.0–46.6)
Hemoglobin: 8.7 g/dL — ABNORMAL LOW (ref 11.1–15.9)
MCH: 26.8 pg (ref 26.6–33.0)
MCHC: 30.5 g/dL — ABNORMAL LOW (ref 31.5–35.7)
MCV: 88 fL (ref 79–97)
Platelets: 316 10*3/uL (ref 150–450)
RBC: 3.25 x10E6/uL — ABNORMAL LOW (ref 3.77–5.28)
RDW: 15.4 % (ref 11.7–15.4)
WBC: 7 10*3/uL (ref 3.4–10.8)

## 2023-10-14 LAB — BASIC METABOLIC PANEL
BUN/Creatinine Ratio: 20 (ref 9–23)
BUN: 23 mg/dL (ref 6–24)
CO2: 30 mmol/L — ABNORMAL HIGH (ref 20–29)
Calcium: 9.3 mg/dL (ref 8.7–10.2)
Chloride: 101 mmol/L (ref 96–106)
Creatinine, Ser: 1.16 mg/dL — ABNORMAL HIGH (ref 0.57–1.00)
Glucose: 109 mg/dL — ABNORMAL HIGH (ref 70–99)
Potassium: 4.3 mmol/L (ref 3.5–5.2)
Sodium: 141 mmol/L (ref 134–144)
eGFR: 60 mL/min/{1.73_m2} (ref >59)

## 2023-10-16 ENCOUNTER — Other Ambulatory Visit: Payer: Self-pay | Admitting: Physician Assistant

## 2023-10-16 ENCOUNTER — Ambulatory Visit (INDEPENDENT_AMBULATORY_CARE_PROVIDER_SITE_OTHER): Payer: BC Managed Care – PPO | Admitting: Physician Assistant

## 2023-10-16 ENCOUNTER — Ambulatory Visit: Payer: BC Managed Care – PPO | Admitting: Internal Medicine

## 2023-10-16 ENCOUNTER — Encounter: Payer: Self-pay | Admitting: Internal Medicine

## 2023-10-16 DIAGNOSIS — Z79899 Other long term (current) drug therapy: Secondary | ICD-10-CM

## 2023-10-16 DIAGNOSIS — I5032 Chronic diastolic (congestive) heart failure: Secondary | ICD-10-CM

## 2023-10-16 DIAGNOSIS — I10 Essential (primary) hypertension: Secondary | ICD-10-CM

## 2023-10-16 NOTE — Progress Notes (Signed)
Repeat BMET per Ronie Spies, PA-C.

## 2023-10-23 ENCOUNTER — Other Ambulatory Visit (INDEPENDENT_AMBULATORY_CARE_PROVIDER_SITE_OTHER): Payer: Self-pay | Admitting: Physician Assistant

## 2023-10-23 ENCOUNTER — Other Ambulatory Visit: Payer: Self-pay | Admitting: Interventional Cardiology

## 2023-10-23 ENCOUNTER — Other Ambulatory Visit (HOSPITAL_COMMUNITY): Payer: Self-pay

## 2023-10-23 ENCOUNTER — Ambulatory Visit (INDEPENDENT_AMBULATORY_CARE_PROVIDER_SITE_OTHER): Payer: BC Managed Care – PPO | Admitting: Family Medicine

## 2023-10-23 DIAGNOSIS — E559 Vitamin D deficiency, unspecified: Secondary | ICD-10-CM

## 2023-10-23 MED ORDER — PEG-3350/ELECTROLYTES 236 G PO SOLR
ORAL | 0 refills | Status: DC
  Filled 2023-10-23: qty 4000, 1d supply, fill #0

## 2023-10-23 MED ORDER — TORSEMIDE 20 MG PO TABS
80.0000 mg | ORAL_TABLET | Freq: Two times a day (BID) | ORAL | 2 refills | Status: DC | PRN
  Filled 2023-10-23: qty 720, 90d supply, fill #0
  Filled 2024-03-16: qty 720, 90d supply, fill #1
  Filled 2024-08-06: qty 720, 90d supply, fill #2

## 2023-10-24 ENCOUNTER — Other Ambulatory Visit: Payer: Self-pay

## 2023-10-25 ENCOUNTER — Ambulatory Visit: Payer: BC Managed Care – PPO | Admitting: Psychology

## 2023-10-25 DIAGNOSIS — F331 Major depressive disorder, recurrent, moderate: Secondary | ICD-10-CM

## 2023-10-25 DIAGNOSIS — F422 Mixed obsessional thoughts and acts: Secondary | ICD-10-CM

## 2023-10-25 DIAGNOSIS — F428 Other obsessive-compulsive disorder: Secondary | ICD-10-CM

## 2023-10-25 NOTE — Progress Notes (Signed)
[facility] Counselor/Therapist Progress Note  Patient ID: Alexandra Beck, MRN: [id number],    Date: 10/25/2023  Time spent: 60 minutes  Time In:  1:01 Time out:2:01  Treatment Type: Individual Therapy  Reported Symptoms: sadness,   Mental Status Exam: Appearance:  Casual     Behavior: Appropriate  Motor: Normal  Speech/Language:  Normal Rate  Affect: blunted  Mood: cooperative  Thought process: normal  Thought content:   WNL  Sensory/Perceptual disturbances:   WNL  Orientation: oriented to person, place, time/date, and situation  Attention: Good  Concentration: Good  Memory: WNL  Fund of knowledge:  Good  Insight:   Good  Judgment:  Good  Impulse Control: Good   Risk Assessment: Danger to Self:  No Self-injurious Behavior: No Danger to Others: No Duty to Warn:no Physical Aggression / Violence:No  Access to Firearms a concern: No  Gang Involvement:No   Subjective: The patient attended a face-to-face individual therapy session today via video visit .  The patient gave verbal consent for the video visit to be on Caregility and the patient is aware of the limitations of telehealth.  The patient was in her home alone and the therapist was in the office.  The patient went to a surgeon in [place] after our last session.  She reports that the surgeon said she does not need surgery right now and he talked to her about the possibility of her having gastric bypass surgery to lose weight in case she might need surgery on her colon moving forward.  She felt good about the outcome and had to schedule a colonoscopy and an endoscopy for January.  She has been looking things up and has been concerned that she has the symptoms of colon cancer.  I encouraged her to stay in the now and not jump to conclusions and try to stay off the Internet at this point in time.  I explained that she probably needs to try to wake until she gets the results back from the  colonoscopy.  Interventions: Cognitive Behavioral Therapy, Assertiveness/Communication, and Problem solving, psycho education  Diagnosis:Major depressive disorder, recurrent episode, moderate (HCC)  Obsessional thoughts  Plan: Treatment Plan  Strengths/Abilities:  Intelligent, ability for insight, supportive wife  Treatment Preferences:  Outpatient Individual therapy  Statement of Needs:  "I Need help with my depression and motivation. "  Symptoms:Depressed or irritable mood.:(Status: improved). Diminished interest in or  enjoyment of activities.(Status: improved). Feelings of hopelessness,  worthlessness, or inappropriate guilt.: (Status: improved). History of chronic or recurrent depression for which the client has taken antidepressant medication, been hospitalized, had  outpatient treatment, or had a course of electroconvulsive therapy.(Status:  maintained). Lack of energy.: (Status: maintained). Low self-esteem.:  (Status: improved). Poor concentration and indecisiveness.:  (Status: improved). Social withdrawal.:  (Status: maintained).  Unresolved grief issues.: (Status: maintained).  Problems Addressed:  Unipolar Depression  Goals:  LTG:1. Alleviate depressive symptoms and return to previous level of effective  functioning.  70% 2. Appropriately grieve the loss in order to normalize mood and to return  to previously adaptive level of functioning. 3. Develop healthy interpersonal relationships that lead to the alleviation  and help prevent the relapse of depression.  70% 4. Develop healthy thinking patterns and beliefs about self, others, and the world that lead to the alleviation and help prevent the relapse of  Depression  70% STG:1.Describe current and past experiences with depression including their impact on functioning and  attempts to resolve it.  70 %  2. Identify and replace thoughts and beliefs that support depression.  70 % 3.Learn and implement behavioral  strategies to overcome depression. 70% 4.Verbalize an understanding of healthy and unhealthy emotions with the intent of increasing the use of  healthy emotions to guide actions.  50 %  Target Date:  10/07/2024 Frequency: Bi Weekly Modality:Individual Interventions by Therapist:  CBT, problem solving, EMDR, insight oriented  Patient approved Treatment Plan  Jessee Mezera Harrel Lemon, LCSW

## 2023-10-27 ENCOUNTER — Telehealth: Payer: Self-pay

## 2023-10-27 ENCOUNTER — Other Ambulatory Visit (HOSPITAL_COMMUNITY): Payer: Self-pay

## 2023-10-27 NOTE — Telephone Encounter (Signed)
Pharmacy Patient Advocate Encounter   Received notification from CoverMyMeds that prior authorization for Modafinil 100MG  tablets  is required/requested.   Insurance verification completed.   The patient is insured through [employer] .   Per test claim: PA required; PA submitted to above mentioned insurance via CoverMyMeds Key/confirmation #/EOC [id number] Status is pending

## 2023-10-28 ENCOUNTER — Other Ambulatory Visit (HOSPITAL_COMMUNITY): Payer: Self-pay

## 2023-10-30 ENCOUNTER — Other Ambulatory Visit (HOSPITAL_COMMUNITY): Payer: Self-pay

## 2023-10-30 ENCOUNTER — Other Ambulatory Visit: Payer: Self-pay

## 2023-10-30 ENCOUNTER — Telehealth: Payer: Self-pay | Admitting: Internal Medicine

## 2023-10-30 ENCOUNTER — Ambulatory Visit: Payer: BC Managed Care – PPO | Admitting: Family

## 2023-10-30 NOTE — Telephone Encounter (Signed)
Pharmacy Patient Advocate Encounter  Received notification from [facility] that Prior Authorization for Modafinil 100mg  has been APPROVED from 10/27/2023 to 10/26/2024. Unable to obtain price due to refill too soon rejection, last fill date 10/28/2023 next available fill date01/17/2025

## 2023-10-31 ENCOUNTER — Other Ambulatory Visit (HOSPITAL_COMMUNITY): Payer: Self-pay

## 2023-10-31 ENCOUNTER — Other Ambulatory Visit: Payer: Self-pay

## 2023-10-31 MED ORDER — SULFAMETHOXAZOLE-TRIMETHOPRIM 800-160 MG PO TABS
1.0000 | ORAL_TABLET | Freq: Two times a day (BID) | ORAL | 0 refills | Status: AC
  Filled 2023-10-31: qty 20, 10d supply, fill #0

## 2023-11-05 ENCOUNTER — Other Ambulatory Visit: Payer: Self-pay | Admitting: Adult Health

## 2023-11-05 ENCOUNTER — Other Ambulatory Visit: Payer: Self-pay | Admitting: Physician Assistant

## 2023-11-05 DIAGNOSIS — F331 Major depressive disorder, recurrent, moderate: Secondary | ICD-10-CM

## 2023-11-05 NOTE — Telephone Encounter (Signed)
Past due for FU. Sent MyChart message to schedule an appt.

## 2023-11-06 ENCOUNTER — Other Ambulatory Visit: Payer: Self-pay | Admitting: Internal Medicine

## 2023-11-06 ENCOUNTER — Other Ambulatory Visit (HOSPITAL_COMMUNITY): Payer: Self-pay

## 2023-11-06 ENCOUNTER — Other Ambulatory Visit: Payer: Self-pay

## 2023-11-06 MED ORDER — LAMOTRIGINE 150 MG PO TABS
150.0000 mg | ORAL_TABLET | Freq: Every day | ORAL | 0 refills | Status: DC
  Filled 2023-11-06: qty 30, 30d supply, fill #0

## 2023-11-06 MED ORDER — LOSARTAN POTASSIUM 100 MG PO TABS
100.0000 mg | ORAL_TABLET | Freq: Every day | ORAL | 3 refills | Status: DC
  Filled 2023-11-06: qty 90, 90d supply, fill #0
  Filled 2024-02-02: qty 90, 90d supply, fill #1
  Filled 2024-05-06: qty 90, 90d supply, fill #2

## 2023-11-06 NOTE — Telephone Encounter (Signed)
Please call to schedule FU. She has not been seen since May. Have sent 2 MyChart messages that she has not responded to. Said she would like to continue to follow with Almira Coaster.

## 2023-11-07 ENCOUNTER — Other Ambulatory Visit: Payer: Self-pay

## 2023-11-08 ENCOUNTER — Ambulatory Visit (INDEPENDENT_AMBULATORY_CARE_PROVIDER_SITE_OTHER): Payer: BC Managed Care – PPO | Admitting: Psychology

## 2023-11-08 DIAGNOSIS — F331 Major depressive disorder, recurrent, moderate: Secondary | ICD-10-CM

## 2023-11-08 NOTE — Progress Notes (Signed)
[facility] Counselor/Therapist Progress Note  Patient ID: Alexandra Beck, MRN: [id number],    Date: 11/08/2023  Time spent: 61 minutes  Time In:  1:03 Time out:2:04  Treatment Type: Individual Therapy  Reported Symptoms: sadness,   Mental Status Exam: Appearance:  Casual     Behavior: Appropriate  Motor: Normal  Speech/Language:  Normal Rate  Affect: blunted  Mood: cooperative  Thought process: normal  Thought content:   WNL  Sensory/Perceptual disturbances:   WNL  Orientation: oriented to person, place, time/date, and situation  Attention: Good  Concentration: Good  Memory: WNL  Fund of knowledge:  Good  Insight:   Good  Judgment:  Good  Impulse Control: Good   Risk Assessment: Danger to Self:  No Self-injurious Behavior: No Danger to Others: No Duty to Warn:no Physical Aggression / Violence:No  Access to Firearms a concern: No  Gang Involvement:No   Subjective: The patient attended a face-to-face individual therapy session today via video visit .  The patient gave verbal consent for the video visit to be on Caregility and the patient is aware of the limitations of telehealth.  The patient was in her home alone and the therapist was in the office.  Patient presents with a blunted affect and mood is sad.  The patient reports that a lot has happened since I saw her last.  She reports that she ended up getting a call from [facility] and they did her colonoscopy and endoscopy on this past Monday.  She reports that she had 2 polyps that they removed and that is all that she really knows from the results of the test.  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  She reports that she did talk to the surgeon again and it seems that she is definitely going to have to have some sort of surgery on her colon at some point.  She has an appointment this month on  December 20 with the weight loss surgeon because I cannot do the colon surgery until she has lost some weight.  We get into a lot of processing today as she was catching me up on what has happened.  She asked me if that was a problem and that we did not process how she felt about it and I explained that I am not sure that she has enough information to be able to fully process it from an emotional standpoint just yet.  We meet and 2 weeks and I told her that she could contact me if she needed to.  Interventions: Cognitive Behavioral Therapy, Assertiveness/Communication, and Problem solving, psycho education  Diagnosis:Major depressive disorder, recurrent episode, moderate (HCC)  Plan: Treatment Plan  Strengths/Abilities:  Intelligent, ability for insight, supportive wife  Treatment Preferences:  Outpatient Individual therapy  Statement of Needs:  "I Need help with my depression and motivation. "  Symptoms:Depressed or irritable mood.:(Status: improved). Diminished interest in or  enjoyment of activities.(Status: improved). Feelings of hopelessness,  worthlessness, or inappropriate guilt.: (Status: improved). History of chronic or recurrent depression for which the client has taken antidepressant medication, been hospitalized, had  outpatient treatment, or had a course of electroconvulsive therapy.(Status:  maintained). Lack of energy.: (Status: maintained). Low self-esteem.:  (Status: improved). Poor concentration and indecisiveness.:  (Status: improved). Social withdrawal.:  (Status: maintained).  Unresolved grief issues.: (Status: maintained).  Problems Addressed:  Unipolar  Depression  Goals:  LTG:1. Alleviate depressive symptoms and return to previous level of effective  functioning.  70% 2. Appropriately grieve the loss in order to normalize mood and to return  to previously adaptive level of functioning. 3. Develop healthy interpersonal relationships that lead to the alleviation   and help prevent the relapse of depression.  70% 4. Develop healthy thinking patterns and beliefs about self, others, and the world that lead to the alleviation and help prevent the relapse of  Depression  70% STG:1.Describe current and past experiences with depression including their impact on functioning and  attempts to resolve it.  70 % 2. Identify and replace thoughts and beliefs that support depression.  70 % 3.Learn and implement behavioral strategies to overcome depression. 70% 4.Verbalize an understanding of healthy and unhealthy emotions with the intent of increasing the use of  healthy emotions to guide actions.  50 %  Target Date:  10/07/2024 Frequency: Bi Weekly Modality:Individual Interventions by Therapist:  CBT, problem solving, EMDR, insight oriented  Patient approved Treatment Plan  Alexandra Beck Alexandra Lemon, LCSW

## 2023-11-13 ENCOUNTER — Ambulatory Visit (INDEPENDENT_AMBULATORY_CARE_PROVIDER_SITE_OTHER): Payer: BC Managed Care – PPO | Admitting: Family Medicine

## 2023-11-13 ENCOUNTER — Encounter (INDEPENDENT_AMBULATORY_CARE_PROVIDER_SITE_OTHER): Payer: Self-pay | Admitting: Family Medicine

## 2023-11-13 VITALS — BP 132/74 | HR 71 | Temp 98.6°F | Ht 63.0 in | Wt >= 6400 oz

## 2023-11-13 DIAGNOSIS — E559 Vitamin D deficiency, unspecified: Secondary | ICD-10-CM | POA: Diagnosis not present

## 2023-11-13 DIAGNOSIS — D508 Other iron deficiency anemias: Secondary | ICD-10-CM

## 2023-11-13 DIAGNOSIS — E119 Type 2 diabetes mellitus without complications: Secondary | ICD-10-CM

## 2023-11-13 DIAGNOSIS — E1165 Type 2 diabetes mellitus with hyperglycemia: Secondary | ICD-10-CM

## 2023-11-13 DIAGNOSIS — D649 Anemia, unspecified: Secondary | ICD-10-CM | POA: Diagnosis not present

## 2023-11-13 DIAGNOSIS — Z7984 Long term (current) use of oral hypoglycemic drugs: Secondary | ICD-10-CM

## 2023-11-13 DIAGNOSIS — D509 Iron deficiency anemia, unspecified: Secondary | ICD-10-CM | POA: Insufficient documentation

## 2023-11-13 DIAGNOSIS — Z6841 Body Mass Index (BMI) 40.0 and over, adult: Secondary | ICD-10-CM

## 2023-11-13 DIAGNOSIS — Z7985 Long-term (current) use of injectable non-insulin antidiabetic drugs: Secondary | ICD-10-CM

## 2023-11-13 NOTE — Assessment & Plan Note (Signed)
Recent H/H lower than previously and has been trending down over the last two years.  Suprisingly there isn't an anemia panel recently nor has there been further investigation as to patient's anemia.  CBC and Anemia panel ordered today.  Will follow up on results and mychart patient with instructions.

## 2023-11-13 NOTE — Assessment & Plan Note (Signed)
On Rx Vitamin D previously but no recent lab.  Vitamin D level ordered today

## 2023-11-13 NOTE — Progress Notes (Signed)
SUBJECTIVE:  Chief Complaint: Obesity  Interim History: Patient saw Alexandra Beck last visit.  She had a CT in October and was found to have diverticular disease.  She has had an extensive workup for her diverticulitis.  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  She has been doing mostly a soft food diet and is still having pain, discomfort. Patient met with surgeon in [facility] who discussed bariatric surgery with her.   Alexandra Beck is here to discuss her progress with her obesity treatment plan. She is on the Category 4 Plan and practicing portion control and making smarter food choices, such as increasing vegetables and decreasing simple carbohydrates and states she is following her eating plan approximately 50 % of the time. She states she is not exercising.   OBJECTIVE: Visit Diagnoses: Problem List Items Addressed This Visit       Endocrine   Diabetes mellitus (HCC)   Last A1c in October and was 6.0.  Patient has been on a variety of medications for her diabetes including mounjaro, ozempic, and metformin.  Currently on Ozempic and metformin with no GI side effects.  CMP, A1c and Insulin level checked today.      Relevant Orders   Comprehensive metabolic panel   Hemoglobin A1c     Other   Morbid obesity (HCC)   Vitamin D deficiency   On Rx Vitamin D previously but no recent lab.  Vitamin D level ordered today      Relevant Orders   VITAMIN D 25 Hydroxy (Vit-D Deficiency, Fractures)   Anemia - Primary   Relevant Orders   CBC w/Diff/Platelet   Vitamin B12   Folate   Iron and TIBC   Ferritin   Other Visit Diagnoses       BMI 70 and over, adult (HCC) Current BMI 74.6           Vitals Temp: 98.6 F (37 C) BP: 132/74 Pulse Rate: 71 SpO2: 95 %   Anthropometric Measurements Height: 5\' 3"  (1.6 m) Weight: (!) 400 lb (181.4 kg) BMI (Calculated): 70.87 Weight at Last Visit: 414 lb Weight Lost Since Last Visit: 14lb Weight Gained Since Last Visit:  0 Starting Weight: 468 lb Total Weight Loss (lbs): 68 lb (30.8 kg)   Body Composition  Body Fat %: 69.2 % Fat Mass (lbs): 277.4 lbs Muscle Mass (lbs): 117.2 lbs Visceral Fat Rating : 35   Other Clinical Data Today's Visit #: 50 Starting Date: 04/17/18     ASSESSMENT AND PLAN:  Diet: Alexandra Beck is currently in the action stage of change. As such, her goal is to continue with weight loss efforts. She has agreed to practicing portion control and making smarter food choices, such as increasing vegetables and decreasing simple carbohydrates.  Exercise: Alexandra Beck has been instructed  be cautious of activity quantity given patient's significantly decreased food intake.   Behavior Modification:  We discussed the following Behavioral Modification Strategies today: increasing lean protein intake, increasing vegetables, no skipping meals, and meal planning and cooking strategies.   No follow-ups on file.Marland Kitchen She was informed of the importance of frequent follow up visits to maximize her success with intensive lifestyle modifications for her multiple health conditions.  Attestation Statements:   Reviewed by clinician on day of visit: allergies, medications, problem list, medical history, surgical history, family history, social history, and previous encounter notes.   Time spent on visit including pre-visit chart review and post-visit care and charting was 45 minutes.   Alexandra  Lawson Radar, MD

## 2023-11-13 NOTE — Assessment & Plan Note (Signed)
Last A1c in October and was 6.0.  Patient has been on a variety of medications for her diabetes including mounjaro, ozempic, and metformin.  Currently on Ozempic and metformin with no GI side effects.  CMP, A1c and Insulin level checked today.

## 2023-11-14 ENCOUNTER — Other Ambulatory Visit: Payer: Self-pay

## 2023-11-14 ENCOUNTER — Other Ambulatory Visit (HOSPITAL_COMMUNITY): Payer: Self-pay

## 2023-11-14 LAB — ANEMIA PANEL
Ferritin: 33 ng/mL (ref 15–150)
Folate, Hemolysate: 336 ng/mL
Folate, RBC: 1226 ng/mL (ref >498)
Hematocrit: 27.4 % — ABNORMAL LOW (ref 34.0–46.6)
Iron Saturation: 5 % — CL (ref 15–55)
Iron: 19 ug/dL — ABNORMAL LOW (ref 27–159)
Retic Ct Pct: 1.6 % (ref 0.6–2.6)
Total Iron Binding Capacity: 380 ug/dL (ref 250–450)
UIBC: 361 ug/dL (ref 131–425)
Vitamin B-12: 387 pg/mL (ref 232–1245)

## 2023-11-14 LAB — COMPREHENSIVE METABOLIC PANEL
ALT: 12 [IU]/L (ref 0–32)
AST: 13 [IU]/L (ref 0–40)
Albumin: 3.7 g/dL — ABNORMAL LOW (ref 3.9–4.9)
Alkaline Phosphatase: 74 [IU]/L (ref 44–121)
BUN/Creatinine Ratio: 17 (ref 9–23)
BUN: 20 mg/dL (ref 6–24)
Bilirubin Total: <0.2 mg/dL (ref 0.0–1.2)
CO2: 25 mmol/L (ref 20–29)
Calcium: 9.7 mg/dL (ref 8.7–10.2)
Chloride: 101 mmol/L (ref 96–106)
Creatinine, Ser: 1.16 mg/dL — ABNORMAL HIGH (ref 0.57–1.00)
Globulin, Total: 2.9 g/dL (ref 1.5–4.5)
Glucose: 100 mg/dL — ABNORMAL HIGH (ref 70–99)
Potassium: 4.6 mmol/L (ref 3.5–5.2)
Sodium: 142 mmol/L (ref 134–144)
Total Protein: 6.6 g/dL (ref 6.0–8.5)
eGFR: 60 mL/min/{1.73_m2} (ref >59)

## 2023-11-14 LAB — VITAMIN D 25 HYDROXY (VIT D DEFICIENCY, FRACTURES): Vit D, 25-Hydroxy: 39.4 ng/mL (ref 30.0–100.0)

## 2023-11-14 LAB — FOLATE: Folate: 3.7 ng/mL (ref >3.0)

## 2023-11-14 LAB — HEMOGLOBIN A1C
Est. average glucose Bld gHb Est-mCnc: 128 mg/dL
Hgb A1c MFr Bld: 6.1 % — ABNORMAL HIGH (ref 4.8–5.6)

## 2023-11-14 MED ORDER — PANTOPRAZOLE SODIUM 40 MG PO TBEC
40.0000 mg | DELAYED_RELEASE_TABLET | Freq: Every day | ORAL | 0 refills | Status: DC
  Filled 2023-11-14: qty 56, 56d supply, fill #0

## 2023-11-23 ENCOUNTER — Other Ambulatory Visit: Payer: Self-pay

## 2023-11-24 ENCOUNTER — Encounter: Payer: Self-pay | Admitting: Adult Health

## 2023-11-24 ENCOUNTER — Other Ambulatory Visit (HOSPITAL_COMMUNITY): Payer: Self-pay

## 2023-11-24 ENCOUNTER — Telehealth: Payer: BC Managed Care – PPO | Admitting: Adult Health

## 2023-11-24 ENCOUNTER — Ambulatory Visit: Payer: BC Managed Care – PPO | Admitting: Psychology

## 2023-11-24 DIAGNOSIS — F338 Other recurrent depressive disorders: Secondary | ICD-10-CM

## 2023-11-24 DIAGNOSIS — G4700 Insomnia, unspecified: Secondary | ICD-10-CM

## 2023-11-24 DIAGNOSIS — F331 Major depressive disorder, recurrent, moderate: Secondary | ICD-10-CM

## 2023-11-24 DIAGNOSIS — F428 Other obsessive-compulsive disorder: Secondary | ICD-10-CM

## 2023-11-24 MED ORDER — SERTRALINE HCL 100 MG PO TABS
200.0000 mg | ORAL_TABLET | Freq: Every day | ORAL | 1 refills | Status: DC
  Filled 2023-11-24 – 2024-01-24 (×2): qty 180, 90d supply, fill #0
  Filled 2024-05-06: qty 180, 90d supply, fill #1

## 2023-11-24 MED ORDER — LAMOTRIGINE 200 MG PO TABS
200.0000 mg | ORAL_TABLET | Freq: Every day | ORAL | 1 refills | Status: DC
  Filled 2023-11-24: qty 90, 90d supply, fill #0
  Filled 2024-03-09: qty 90, 90d supply, fill #1

## 2023-11-24 NOTE — Progress Notes (Signed)
[facility] Counselor/Therapist Progress Note  Patient ID: KYIRA FRITZE, MRN: [id number],    Date: 11/24/2023  Time spent: 66 minutes  Time In:  1:04 Time out:2:10  Treatment Type: Individual Therapy  Reported Symptoms: sadness,   Mental Status Exam: Appearance:  Casual     Behavior: Appropriate  Motor: Normal  Speech/Language:  Normal Rate  Affect: blunted  Mood: cooperative  Thought process: normal  Thought content:   WNL  Sensory/Perceptual disturbances:   WNL  Orientation: oriented to person, place, time/date, and situation  Attention: Good  Concentration: Good  Memory: WNL  Fund of knowledge:  Good  Insight:   Good  Judgment:  Good  Impulse Control: Good   Risk Assessment: Danger to Self:  No Self-injurious Behavior: No Danger to Others: No Duty to Warn:no Physical Aggression / Violence:No  Access to Firearms a concern: No  Gang Involvement:No   Subjective: The patient attended a face-to-face individual therapy session today via video visit .  The patient gave verbal consent for the video visit to be on Caregility and the patient is aware of the limitations of telehealth.  The patient was in her home alone and the therapist was in the office.  Patient presents with a blunted affect and mood is pleasant.  The patient reports that she went to see the weight loss doctor and she has made it through to actually seeing someone that might do the surgery.  They are looking at possibly doing it in the coming year.  We talked about her making a vision board and talked about this being a good thing because she has put forth some intentions and that she can make things happen if she puts her efforts towards the intentions.  The patient was wondering whether she was manic because she worked on a project for work yesterday for a number of hours without eating and doing what she needed to do to take care of herself.  I told her that we would probably need to watch it for  a while and see whether that continues or if she has any other types of symptoms.  She is seeing [employer] at 3:00 today.  We will continue to monitor her mood and work towards helping her to be focused on achieving the goals that she set for herself. Interventions: Cognitive Behavioral Therapy, Assertiveness/Communication, and Problem solving, psycho education  Diagnosis:Major depressive disorder, recurrent episode, moderate (HCC)  Obsessional thoughts  Plan: Treatment Plan  Strengths/Abilities:  Intelligent, ability for insight, supportive wife  Treatment Preferences:  Outpatient Individual therapy  Statement of Needs:  "I Need help with my depression and motivation. "  Symptoms:Depressed or irritable mood.:(Status: improved). Diminished interest in or  enjoyment of activities.(Status: improved). Feelings of hopelessness,  worthlessness, or inappropriate guilt.: (Status: improved). History of chronic or recurrent depression for which the client has taken antidepressant medication, been hospitalized, had  outpatient treatment, or had a course of electroconvulsive therapy.(Status:  maintained). Lack of energy.: (Status: maintained). Low self-esteem.:  (Status: improved). Poor concentration and indecisiveness.:  (Status: improved). Social withdrawal.:  (Status: maintained).  Unresolved grief issues.: (Status: maintained).  Problems Addressed:  Unipolar Depression  Goals:  LTG:1. Alleviate depressive symptoms and return to previous level of effective  functioning.  70% 2. Appropriately grieve the loss in order to normalize mood and to return  to previously adaptive level of functioning. 3. Develop healthy interpersonal relationships that lead to the alleviation  and help prevent the relapse of depression.  70% 4. Develop healthy thinking patterns and beliefs about self, others, and the world that lead to the alleviation and help prevent the relapse of  Depression   70% STG:1.Describe current and past experiences with depression including their impact on functioning and  attempts to resolve it.  70 % 2. Identify and replace thoughts and beliefs that support depression.  70 % 3.Learn and implement behavioral strategies to overcome depression. 70% 4.Verbalize an understanding of healthy and unhealthy emotions with the intent of increasing the use of  healthy emotions to guide actions.  50 %  Target Date:  10/07/2024 Frequency: Bi Weekly Modality:Individual Interventions by Therapist:  CBT, problem solving, EMDR, insight oriented  Patient approved Treatment Plan  Blane Worthington Harrel Lemon, LCSW

## 2023-11-24 NOTE — Progress Notes (Signed)
Alexandra Beck [id number] April 30, 1981 42 y.o.  Virtual Visit via Video Note  I connected with pt @ on 11/24/23 at  3:00 PM EST by a video enabled telemedicine application and verified that I am speaking with the correct person using two identifiers.   I discussed the limitations of evaluation and management by telemedicine and the availability of in person appointments. The patient expressed understanding and agreed to proceed.  I discussed the assessment and treatment plan with the patient. The patient was provided an opportunity to ask questions and all were answered. The patient agreed with the plan and demonstrated an understanding of the instructions.   The patient was advised to call back or seek an in-person evaluation if the symptoms worsen or if the condition fails to improve as anticipated.  I provided 15 minutes of non-face-to-face time during this encounter.  The patient was located at home.  The provider was located at [facility].   Dorothyann Gibbs, NP   Subjective:   Patient ID:  Alexandra Beck is a 42 y.o. (DOB 1981-08-22) female.  Chief Complaint: No chief complaint on file.   HPI Alexandra Beck presents for follow-up of insomnia, obsessional thoughts and depression.  Describes mood today as "ok". Pleasant. Decreased tearfulness - "here and there". Mood symptoms - reports depression, anxiety, and irritability. Reports recent panic attack. Denies worry, rumination, and over thinking. Reports obsessive thoughts or acts. Reports some intrusive thoughts. Mood has improved. Stating "I feel like I'm stable overall, but I don't have the equilibrium I would like to have". Feels like current medications are helpful, but is willing to consider other options. Seeing therapist - Bambi Cottle for therapy. Varying interest and motivation. Taking medications as prescribed. Energy levels lower. Active, does not have a regular exercise routine.  Enjoys some usual interests and  activities. Married. Lives with wife and 3 cats. Spending time with family. Gaming online with friends. Appetite adequate. Weight loss - 14 pounds. Taking Ozempic. Reports possible weight loss surgery.  Sleeping well most nights. Averages 7 to 8 hours. Focus and concentration stable. Completing tasks. Managing minimal aspects of household. Works 12 to 9 - at [employer].  Denies SI or HI.  Denies AH or VH. Denies self harm. Denies substance use. Seeing Bambi Cottle for therapy.  Previous medication trials: Trialed on Wellbutrin at age 33, Trintellix-weird dreams, Latuda  Review of Systems:  Review of Systems  Musculoskeletal:  Negative for gait problem.  Neurological:  Negative for tremors.  Psychiatric/Behavioral:         Please refer to HPI    Medications: I have reviewed the patient's current medications.  Current Outpatient Medications  Medication Sig Dispense Refill   ibuprofen (ADVIL) 200 MG tablet Take 800 mg by mouth daily as needed for headache or moderate pain (pain score 4-6).     lamoTRIgine (LAMICTAL) 200 MG tablet Take 1 tablet (200 mg total) by mouth daily. 90 tablet 1   levothyroxine (SYNTHROID) 150 MCG tablet Take 1 tablet (150 mcg total) by mouth daily before breakfast. Must keep scheduled appt for future refills 90 tablet 3   losartan (COZAAR) 100 MG tablet Take 1 tablet (100 mg total) by mouth daily. 90 tablet 3   metFORMIN (GLUCOPHAGE) 500 MG tablet Take 1 tablet (500 mg total) by mouth daily. Must keep scheduled appt for future refills 90 tablet 3   metoprolol tartrate (LOPRESSOR) 50 MG tablet Take 50 mg by mouth 2 (two) times daily.  modafinil (PROVIGIL) 100 MG tablet Take 1 tablet (100mg  total) by mouth daily at 9am (along with 200 mg tablet daily at 9am for a total of 300 mg daily) (Patient taking differently: Take 100 mg by mouth daily. Take daily at 1230 with a 200mg  tablet.) 60 tablet 3   modafinil (PROVIGIL) 200 MG tablet Take 1 tablet by mouth daily  at 9am (along with 100 mg tablet daily at 9am (total 300mg )) (Patient taking differently: Take 200 mg by mouth daily. Take 1 tablet by mouth daily at 1230 with a 100mg  tablet) 60 tablet 3   pantoprazole (PROTONIX) 40 MG tablet Take 1 tablet (40 mg total) by mouth daily before breakfast. 56 tablet 0   rosuvastatin (CRESTOR) 20 MG tablet Take 1 tablet (20 mg total) by mouth daily. 90 tablet 3   Semaglutide, 2 MG/DOSE, 8 MG/3ML SOPN Inject 2 mg into the skin as directed once a week. (Patient taking differently: Inject 2 mg as directed once a week. Take on Fridays at 1230) 9 mL 0   sertraline (ZOLOFT) 100 MG tablet Take 2 tablets (200 mg total) by mouth daily. 180 tablet 1   spironolactone (ALDACTONE) 25 MG tablet Take 1 tablet (25 mg total) by mouth daily. 90 tablet 3   torsemide (DEMADEX) 20 MG tablet Take 4 tablets (80 mg total) by mouth 2 (two) times daily as needed. 720 tablet 2   triamcinolone cream (KENALOG) 0.1 % Apply 1 Application topically 2 (two) times daily. (Patient taking differently: Apply 1 Application topically daily as needed (Apply to hands).) 30 g 1   Vitamin D, Ergocalciferol, (DRISDOL) 1.25 MG (50000 UNIT) CAPS capsule Take 1 capsule (50,000 Units total) by mouth every 7 (seven) days. (Patient taking differently: Take 50,000 Units by mouth once a week. Take on Sundays at 1230) 4 capsule 0   No current facility-administered medications for this visit.    Medication Side Effects: None  Allergies:  Allergies  Allergen Reactions   Aspirin Hives, Swelling and Other (See Comments)    REACTION: throat swelling, hives  Other reaction(s): Unknown  REACTION: throat swelling, hives Other reaction(s): Unknown Other reaction(s): Unknown   Doxycycline Other (See Comments)    Abdominal pain, difficulty swallowing   Lisinopril Cough   Niacin     Other reaction(s): Unknown Other reaction(s): Unknown   Niacin And Related     Other reaction(s): Unknown Other reaction(s): Unknown    Niaspan [Niacin Er (Antihyperlipidemic)]     Caused flushing   Wound Dressing Adhesive Itching    Welts and burning of the skin   Wound Dressings Itching    Welts and burning of the skin    Past Medical History:  Diagnosis Date   Acute renal failure (ARF) (HCC) 11/2012    multifactorial-likely secondary to ATN in the setting of sepsis and hypotension, and also from rhabdomyolysis   Anemia    Cellulitis    Chronic diastolic CHF (congestive heart failure) (HCC)    Depression    Diabetes mellitus (HCC)    GERD (gastroesophageal reflux disease)    Hyperlipidemia    Hypertension    Hypothyroidism    Morbid obesity with BMI of 70 and over, adult (HCC)    Obesity hypoventilation syndrome (HCC)    OSA (obstructive sleep apnea)    PVC's (premature ventricular contractions)    Recurrent cellulitis of lower leg    LLE, venous insuff   Sepsis (HCC) 11/2012   Secondary to cellulitis   Sleep apnea  Venous insufficiency of leg     Family History  Problem Relation Age of Onset   Hypertension Mother    Diabetes Mother    High Cholesterol Mother    Thyroid disease Mother    Sleep apnea Mother    Obesity Mother    Hypertension Father    Heart disease Father        before age 22   High Cholesterol Father    Sleep apnea Father    Obesity Father     Social History   Socioeconomic History   Marital status: Single    Spouse name: Not on file   Number of children: 0   Years of education: Not on file   Highest education level: Not on file  Occupational History   Occupation: Librarian    Employer: [employer]  Tobacco Use   Smoking status: Former    Current packs/day: 0.00    Average packs/day: 0.5 packs/day for 8.0 years (4.0 ttl pk-yrs)    Types: Cigarettes    Start date: 2003    Quit date: 2011    Years since quitting: 13.9   Smokeless tobacco: Never  Vaping Use   Vaping status: Never Used  Substance and Sexual Activity   Alcohol use: No    Alcohol/week: 0.0  standard drinks of alcohol   Drug use: No   Sexual activity: Not on file  Other Topics Concern   Not on file  Social History Narrative   Librarian, 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)   Social Drivers of Corporate investment banker Strain: Not on file  Food Insecurity: No Food Insecurity (09/22/2023)   Hunger Vital Sign    Worried About Running Out of Food in the Last Year: Never true    Ran Out of Food in the Last Year: Never true  Transportation Needs: No Transportation Needs (09/22/2023)   PRAPARE - Administrator, Civil Service (Medical): No    Lack of Transportation (Non-Medical): No  Physical Activity: Not on file  Stress: Not on file  Social Connections: Not on file  Intimate Partner Violence: Not At Risk (09/22/2023)   Humiliation, Afraid, Rape, and Kick questionnaire    Fear of Current or Ex-Partner: No    Emotionally Abused: No    Physically Abused: No    Sexually Abused: No    Past Medical History, Surgical history, Social history, and Family history were reviewed and updated as appropriate.   Please see review of systems for further details on the patient's review from today.   Objective:   Physical Exam:  There were no vitals taken for this visit.  Physical Exam Constitutional:      General: She is not in acute distress. Musculoskeletal:        General: No deformity.  Neurological:     Mental Status: She is alert and oriented to person, place, and time.     Coordination: Coordination normal.  Psychiatric:        Attention and Perception: Attention and perception normal. She does not perceive auditory or visual hallucinations.        Mood and Affect: Mood normal. Mood is not anxious or depressed. Affect is not labile, blunt, angry or inappropriate.        Speech: Speech normal.        Behavior: Behavior normal.        Thought Content: Thought content normal. Thought content is not paranoid or delusional. Thought content  does not  include homicidal or suicidal ideation. Thought content does not include homicidal or suicidal plan.        Cognition and Memory: Cognition and memory normal.        Judgment: Judgment normal.     Comments: Insight intact     Lab Review:     Component Value Date/Time   NA 142 11/13/2023 1325   K 4.6 11/13/2023 1325   CL 101 11/13/2023 1325   CO2 25 11/13/2023 1325   GLUCOSE 100 (H) 11/13/2023 1325   GLUCOSE 87 09/28/2023 0925   BUN 20 11/13/2023 1325   CREATININE 1.16 (H) 11/13/2023 1325   CREATININE 1.06 11/14/2016 1050   CALCIUM 9.7 11/13/2023 1325   CALCIUM 9.6 06/26/2007 0000   PROT 6.6 11/13/2023 1325   ALBUMIN 3.7 (L) 11/13/2023 1325   AST 13 11/13/2023 1325   ALT 12 11/13/2023 1325   ALKPHOS 74 11/13/2023 1325   BILITOT <0.2 11/13/2023 1325   GFRNONAA >60 09/28/2023 0925   GFRAA 81 11/11/2020 1211       Component Value Date/Time   WBC 7.0 10/13/2023 1058   WBC 5.2 09/27/2023 0822   RBC 3.25 (L) 10/13/2023 1058   RBC 3.24 (L) 09/27/2023 0822   HGB 8.7 (L) 10/13/2023 1058   HCT 27.4 (L) 11/13/2023 1325   PLT 316 10/13/2023 1058   MCV 88 10/13/2023 1058   MCH 26.8 10/13/2023 1058   MCH 26.2 09/27/2023 0822   MCHC 30.5 (L) 10/13/2023 1058   MCHC 30.1 09/27/2023 0822   RDW 15.4 10/13/2023 1058   LYMPHSABS 1.0 09/23/2023 0451   LYMPHSABS 1.2 03/13/2023 1511   MONOABS 0.7 09/23/2023 0451   EOSABS 0.2 09/23/2023 0451   EOSABS 0.2 03/13/2023 1511   BASOSABS 0.0 09/23/2023 0451   BASOSABS 0.0 03/13/2023 1511    No results found for: "POCLITH", "LITHIUM"   No results found for: "PHENYTOIN", "PHENOBARB", "VALPROATE", "CBMZ"   .res Assessment: Plan:    Plan:   PDMP reviewed  Zoloft 100mg  daily - take two tablets daily  Increase Lamictal 150mg  to 200mg  at hs  RTC 3 months  Patient advised to contact office with any questions, adverse effects, or acute worsening in signs and symptoms.  Time spent with patient was 20 minutes. Greater than 50% of face  to face time with patient was spent on counseling and coordination of care.    Discussed potential metabolic side effects associated with atypical antipsychotics, as well as potential risk for movement side effects. Advised pt to contact office if movement side effects occur.  Diagnoses and all orders for this visit:  Major depressive disorder, recurrent episode, moderate (HCC) -     lamoTRIgine (LAMICTAL) 200 MG tablet; Take 1 tablet (200 mg total) by mouth daily. -     sertraline (ZOLOFT) 100 MG tablet; Take 2 tablets (200 mg total) by mouth daily.  Obsessional thoughts -     sertraline (ZOLOFT) 100 MG tablet; Take 2 tablets (200 mg total) by mouth daily.  Insomnia, unspecified type     Please see After Visit Summary for patient specific instructions.  Future Appointments  Date Time Provider Department Center  12/06/2023  1:00 PM Cottle, Lynnell Dike, LCSW [facility] None  12/20/2023  1:00 PM Cottle, Bambi G, LCSW [facility] None  12/25/2023 12:20 PM Langston Reusing, MD [facility] None  01/03/2024 10:55 AM Shea Evans, Tacey Ruiz, PA-C [facility]  01/03/2024  1:00 PM Cottle, Bambi G, LCSW [facility] None  01/17/2024  1:00  PM Cottle, Bambi G, LCSW [facility] None  01/31/2024  1:00 PM Cottle, Bambi G, LCSW [facility] None  02/14/2024  1:00 PM Cottle, Bambi G, LCSW [facility] None  03/27/2024  1:00 PM Cottle, Bambi G, LCSW [facility] None  07/05/2024  1:40 PM Corwin Levins, MD [facility] None    No orders of the defined types were placed in this encounter.     -------------------------------

## 2023-11-28 ENCOUNTER — Other Ambulatory Visit (HOSPITAL_COMMUNITY): Payer: Self-pay

## 2023-11-28 ENCOUNTER — Other Ambulatory Visit: Payer: Self-pay

## 2023-11-28 ENCOUNTER — Telehealth: Payer: Self-pay

## 2023-11-28 NOTE — Telephone Encounter (Signed)
*  [facility]  Pharmacy Patient Advocate Encounter   Received notification from CoverMyMeds that prior authorization for Modafinil  200MG  tablets  is required/requested.   Insurance verification completed.   The patient is insured through [facility] .   Per test claim: PA required; PA submitted to above mentioned insurance via CoverMyMeds Key/confirmation #/EOC [id number] Status is pending

## 2023-11-28 NOTE — Telephone Encounter (Signed)
 Pharmacy Patient Advocate Encounter  Received notification from [facility] that Prior Authorization for Modafinil 200mg  tablets has been APPROVED from 11/28/2023 to 11/26/2024

## 2023-12-01 ENCOUNTER — Other Ambulatory Visit (HOSPITAL_COMMUNITY): Payer: Self-pay

## 2023-12-06 ENCOUNTER — Ambulatory Visit (INDEPENDENT_AMBULATORY_CARE_PROVIDER_SITE_OTHER): Payer: 59 | Admitting: Psychology

## 2023-12-06 DIAGNOSIS — F422 Mixed obsessional thoughts and acts: Secondary | ICD-10-CM

## 2023-12-06 DIAGNOSIS — F331 Major depressive disorder, recurrent, moderate: Secondary | ICD-10-CM

## 2023-12-06 DIAGNOSIS — F428 Other obsessive-compulsive disorder: Secondary | ICD-10-CM

## 2023-12-06 NOTE — Progress Notes (Signed)
 [facility] Counselor/Therapist Progress Note  Patient ID: Alexandra Beck, MRN: [id number],    Date: 12/06/2023  Time spent: 55 minutes  Time In:  1:04 Time out:1:59  Treatment Type: Individual Therapy  Reported Symptoms: sadness,   Mental Status Exam: Appearance:  Casual     Behavior: Appropriate  Motor: Normal  Speech/Language:  Normal Rate  Affect: blunted  Mood: cooperative  Thought process: normal  Thought content:   WNL  Sensory/Perceptual disturbances:   WNL  Orientation: oriented to person, place, time/date, and situation  Attention: Good  Concentration: Good  Memory: WNL  Fund of knowledge:  Good  Insight:   Good  Judgment:  Good  Impulse Control: Good   Risk Assessment: Danger to Self:  No Self-injurious Behavior: No Danger to Others: No Duty to Warn:no Physical Aggression / Violence:No  Access to Firearms a concern: No  Gang Involvement:No   Subjective: The patient attended a face-to-face individual therapy session today via video visit .  The patient gave verbal consent for the video visit to be on Caregility and the patient is aware of the limitations of telehealth.  The patient was in her home alone and the therapist was in the office.  Patient presents with a blunted affect and mood is apathetic.  The patient reports that she really did not want to talk today.  We talked about her mood and I am a little concerned because the last session she seemed to be in a more elevated space and now she seems to be more neutral.  She reports that Tillman Sayers went up on her medications and I will continue to watch and see if this is going to be a concern with her mood.  The patient talked today about not straining and I am concerned she might be moving into a depressive episode.  I recommended that she get herself back to where she is stringing as that was a stress reliever for her.  I do think she is very overwhelmed by the changes that are happening in that  she has been struggling with her physical health.  Interventions: Cognitive Behavioral Therapy, Assertiveness/Communication, and Problem solving, psycho education  Diagnosis:Major depressive disorder, recurrent episode, moderate (HCC)  Obsessional thoughts  Plan: Treatment Plan  Strengths/Abilities:  Intelligent, ability for insight, supportive wife  Treatment Preferences:  Outpatient Individual therapy  Statement of Needs:  I Need help with my depression and motivation.   Symptoms:Depressed or irritable mood.:(Status: improved). Diminished interest in or  enjoyment of activities.(Status: improved). Feelings of hopelessness,  worthlessness, or inappropriate guilt.: (Status: improved). History of chronic or recurrent depression for which the client has taken antidepressant medication, been hospitalized, had  outpatient treatment, or had a course of electroconvulsive therapy.(Status:  maintained). Lack of energy.: (Status: maintained). Low self-esteem.:  (Status: improved). Poor concentration and indecisiveness.:  (Status: improved). Social withdrawal.:  (Status: maintained).  Unresolved grief issues.: (Status: maintained).  Problems Addressed:  Unipolar Depression  Goals:  LTG:1. Alleviate depressive symptoms and return to previous level of effective  functioning.  70% 2. Appropriately grieve the loss in order to normalize mood and to return  to previously adaptive level of functioning. 3. Develop healthy interpersonal relationships that lead to the alleviation  and help prevent the relapse of depression.  70% 4. Develop healthy thinking patterns and beliefs about self, others, and the world that lead to the alleviation and help prevent the relapse of  Depression  70% STG:1.Describe current and past experiences with depression  including their impact on functioning and  attempts to resolve it.  70 % 2. Identify and replace thoughts and beliefs that support depression.  70  % 3.Learn and implement behavioral strategies to overcome depression. 70% 4.Verbalize an understanding of healthy and unhealthy emotions with the intent of increasing the use of  healthy emotions to guide actions.  50 %  Target Date:  10/07/2024 Frequency: Bi Weekly Modality:Individual Interventions by Therapist:  CBT, problem solving, EMDR, insight oriented  Patient approved Treatment Plan  Alexandra Beck Alexandra Macintosh, LCSW

## 2023-12-11 ENCOUNTER — Other Ambulatory Visit: Payer: Self-pay

## 2023-12-13 ENCOUNTER — Telehealth: Payer: Self-pay

## 2023-12-13 NOTE — Telephone Encounter (Signed)
 Patient is scheduled for telephone visit 1/29 consent and med rec done     Patient Consent for Virtual Visit         Jet Dietiker Gal has provided verbal consent on 12/13/2023 for a virtual visit (video or telephone).   CONSENT FOR VIRTUAL VISIT FOR:  Alexandra Beck  By participating in this virtual visit I agree to the following:  I hereby voluntarily request, consent and authorize [facility] and its employed or contracted physicians, physician assistants, nurse practitioners or other licensed health care professionals (the Practitioner), to provide me with telemedicine health care services (the "Services") as deemed necessary by the treating Practitioner. I acknowledge and consent to receive the Services by the Practitioner via telemedicine. I understand that the telemedicine visit will involve communicating with the Practitioner through live audiovisual communication technology and the disclosure of certain medical information by electronic transmission. I acknowledge that I have been given the opportunity to request an in-person assessment or other available alternative prior to the telemedicine visit and am voluntarily participating in the telemedicine visit.  I understand that I have the right to withhold or withdraw my consent to the use of telemedicine in the course of my care at any time, without affecting my right to future care or treatment, and that the Practitioner or I may terminate the telemedicine visit at any time. I understand that I have the right to inspect all information obtained and/or recorded in the course of the telemedicine visit and may receive copies of available information for a reasonable fee.  I understand that some of the potential risks of receiving the Services via telemedicine include:  Delay or interruption in medical evaluation due to technological equipment failure or disruption; Information transmitted may not be sufficient (e.g. poor resolution of  images) to allow for appropriate medical decision making by the Practitioner; and/or  In rare instances, security protocols could fail, causing a breach of personal health information.  Furthermore, I acknowledge that it is my responsibility to provide information about my medical history, conditions and care that is complete and accurate to the best of my ability. I acknowledge that Practitioner's advice, recommendations, and/or decision may be based on factors not within their control, such as incomplete or inaccurate data provided by me or distortions of diagnostic images or specimens that may result from electronic transmissions. I understand that the practice of medicine is not an exact science and that Practitioner makes no warranties or guarantees regarding treatment outcomes. I acknowledge that a copy of this consent can be made available to me via my patient portal [facility] MyChart), or I can request a printed copy by calling the office of [facility].    I understand that my insurance will be billed for this visit.   I have read or had this consent read to me. I understand the contents of this consent, which adequately explains the benefits and risks of the Services being provided via telemedicine.  I have been provided ample opportunity to ask questions regarding this consent and the Services and have had my questions answered to my satisfaction. I give my informed consent for the services to be provided through the use of telemedicine in my medical care

## 2023-12-13 NOTE — Telephone Encounter (Signed)
 Patient is scheduled for telephone visit 1/29 consent and med rec done

## 2023-12-13 NOTE — Telephone Encounter (Signed)
   Pre-operative Risk Assessment    Patient Name: Alexandra Beck  DOB: 23-Aug-1981 MRN: [id number]   Date of last office visit: 10/13/23 Date of next office visit: 01/03/24   Request for Surgical Clearance    Procedure:   Bariatric Surgery  Date of Surgery:  Clearance TBD                                Surgeon:  Not indicated Surgeon's Group or Practice Name:  [facility] Department of General and Bariatric Surgery Phone number:  [phone] Fax number:  [phone]   Type of Clearance Requested:   - Medical    Type of Anesthesia:  General    Additional requests/questions:    Gardiner Jumper   12/13/2023, 7:54 AM

## 2023-12-13 NOTE — Telephone Encounter (Signed)
   Name: Alexandra Beck  DOB: 03/07/1981  MRN: [id number]  Primary Cardiologist: Avery Bodo, MD Last seen on 10/13/2023  Preoperative team, please contact this patient and set up a phone call appointment for further preoperative risk assessment. Please obtain consent and complete medication review. Thank you for your help.  I confirm that guidance regarding antiplatelet and oral anticoagulation therapy has been completed and, if necessary, noted below.  Review of patient med list she is not on anticoagulation or antiplatelet therapy.  I also confirmed the patient resides in the state of [place] . As per [facility] Medical Board telemedicine laws, the patient must reside in the state in which the provider is licensed.   Friddie Jetty, NP 12/13/2023, 12:03 PM [facility]

## 2023-12-18 ENCOUNTER — Other Ambulatory Visit: Payer: Self-pay | Admitting: Pulmonary Disease

## 2023-12-18 ENCOUNTER — Other Ambulatory Visit (INDEPENDENT_AMBULATORY_CARE_PROVIDER_SITE_OTHER): Payer: Self-pay | Admitting: Physician Assistant

## 2023-12-18 DIAGNOSIS — E559 Vitamin D deficiency, unspecified: Secondary | ICD-10-CM

## 2023-12-20 ENCOUNTER — Ambulatory Visit: Payer: 59 | Admitting: Psychology

## 2023-12-20 DIAGNOSIS — F428 Other obsessive-compulsive disorder: Secondary | ICD-10-CM

## 2023-12-20 DIAGNOSIS — F331 Major depressive disorder, recurrent, moderate: Secondary | ICD-10-CM | POA: Diagnosis not present

## 2023-12-20 NOTE — Progress Notes (Signed)
[facility] Counselor/Therapist Progress Note  Patient ID: Alexandra Beck, MRN: [id number],    Date: 12/20/2023  Time spent: 60 minutes  Time In:  1:02 Time out:2:02  Treatment Type: Individual Therapy  Reported Symptoms: sadness,   Mental Status Exam: Appearance:  Casual     Behavior: Appropriate  Motor: Normal  Speech/Language:  Normal Rate  Affect: labile  Mood: sad  Thought process: normal  Thought content:   WNL  Sensory/Perceptual disturbances:   WNL  Orientation: oriented to person, place, time/date, and situation  Attention: Good  Concentration: Good  Memory: WNL  Fund of knowledge:  Good  Insight:   Good  Judgment:  Good  Impulse Control: Good   Risk Assessment: Danger to Self:  No Self-injurious Behavior: No Danger to Others: No Duty to Warn:no Physical Aggression / Violence:No  Access to Firearms a concern: No  Gang Involvement:No   Subjective: The patient attended a face-to-face individual therapy session today via video visit .  The patient gave verbal consent for the video visit to be on Care[facility] and the patient is aware of the limitations of telehealth.  The patient was in her home alone and the therapist was in the office.  The patient presents with a tearful affect and her mood is labile.  The patient reports that she has had a bad day and she almost could not talk when we first got on the session.  She was struggling with pulling her emotions together.  She reports that she had a bad day because she had gotten a text from [facility] that said that her appointment was canceled today.  Then not long after she got the text the psychologist who was doing her testing for her bariatric surgery called her and wanted to do the testing anyway because it was virtual.  The psychologist was not completely understanding and nice the patient was somewhat defensive about the questions that the person was asking.  After she got finished with that she was having in an  emotional moment and her partner seem to have a difficult time because things were out of order in the kitchen and the patient felt like she was being personal about it and so things just went from bad to worse.  We talked about the situation being difficult and talked about the level of stress she and [name] with her health conditions and also with things that are going on from an emotional perspective.  By the time we finished the session she was doing better and we did some reframing of how to think about her relationship with [name] and the stress that she is under. Interventions: Cognitive Behavioral Therapy, Assertiveness/Communication, and Problem solving, psycho education  Diagnosis:Obsessional thoughts  Major depressive disorder, recurrent episode, moderate (HCC)  Plan: Treatment Plan  Strengths/Abilities:  Intelligent, ability for insight, supportive wife  Treatment Preferences:  Outpatient Individual therapy  Statement of Needs:  "I Need help with my depression and motivation. "  Symptoms:Depressed or irritable mood.:(Status: improved). Diminished interest in or  enjoyment of activities.(Status: improved). Feelings of hopelessness,  worthlessness, or inappropriate guilt.: (Status: improved). History of chronic or recurrent depression for which the client has taken antidepressant medication, been hospitalized, had  outpatient treatment, or had a course of electroconvulsive therapy.(Status:  maintained). Lack of energy.: (Status: maintained). Low self-esteem.:  (Status: improved). Poor concentration and indecisiveness.:  (Status: improved). Social withdrawal.:  (Status: maintained).  Unresolved grief issues.: (Status: maintained).  Problems Addressed:  Unipolar  Depression  Goals:  LTG:1. Alleviate depressive symptoms and return to previous level of effective  functioning.  70% 2. Appropriately grieve the loss in order to normalize mood and to return  to previously  adaptive level of functioning. 3. Develop healthy interpersonal relationships that lead to the alleviation  and help prevent the relapse of depression.  70% 4. Develop healthy thinking patterns and beliefs about self, others, and the world that lead to the alleviation and help prevent the relapse of  Depression  70% STG:1.Describe current and past experiences with depression including their impact on functioning and  attempts to resolve it.  70 % 2. Identify and replace thoughts and beliefs that support depression.  70 % 3.Learn and implement behavioral strategies to overcome depression. 70% 4.Verbalize an understanding of healthy and unhealthy emotions with the intent of increasing the use of  healthy emotions to guide actions.  50 %  Target Date:  10/07/2024 Frequency: Bi Weekly Modality:Individual Interventions by Therapist:  CBT, problem solving, EMDR, insight oriented  Patient approved Treatment Plan  Alexandra Vanzandt Harrel Lemon, LCSW

## 2023-12-25 ENCOUNTER — Ambulatory Visit (INDEPENDENT_AMBULATORY_CARE_PROVIDER_SITE_OTHER): Payer: Self-pay | Admitting: Family Medicine

## 2023-12-25 ENCOUNTER — Other Ambulatory Visit: Payer: Self-pay | Admitting: Family Medicine

## 2023-12-25 ENCOUNTER — Other Ambulatory Visit (HOSPITAL_COMMUNITY): Payer: Self-pay

## 2023-12-25 ENCOUNTER — Other Ambulatory Visit: Payer: Self-pay

## 2023-12-25 VITALS — BP 132/70 | HR 82 | Temp 98.4°F | Ht 63.0 in | Wt 381.0 lb

## 2023-12-25 DIAGNOSIS — E559 Vitamin D deficiency, unspecified: Secondary | ICD-10-CM

## 2023-12-25 DIAGNOSIS — K3189 Other diseases of stomach and duodenum: Secondary | ICD-10-CM

## 2023-12-25 DIAGNOSIS — Z7985 Long-term (current) use of injectable non-insulin antidiabetic drugs: Secondary | ICD-10-CM

## 2023-12-25 DIAGNOSIS — Z6841 Body Mass Index (BMI) 40.0 and over, adult: Secondary | ICD-10-CM

## 2023-12-25 DIAGNOSIS — E1165 Type 2 diabetes mellitus with hyperglycemia: Secondary | ICD-10-CM

## 2023-12-25 DIAGNOSIS — E119 Type 2 diabetes mellitus without complications: Secondary | ICD-10-CM

## 2023-12-25 MED ORDER — VITAMIN D (ERGOCALCIFEROL) 1.25 MG (50000 UNIT) PO CAPS
50000.0000 [IU] | ORAL_CAPSULE | ORAL | 0 refills | Status: DC
  Filled 2023-12-25: qty 4, 28d supply, fill #0

## 2023-12-25 MED ORDER — SEMAGLUTIDE (2 MG/DOSE) 8 MG/3ML ~~LOC~~ SOPN
2.0000 mg | PEN_INJECTOR | SUBCUTANEOUS | 0 refills | Status: DC
  Filled 2023-12-25 – 2024-01-05 (×2): qty 9, 84d supply, fill #0

## 2023-12-25 NOTE — Progress Notes (Signed)
SUBJECTIVE:  Chief Complaint: Obesity  Interim History: Patient voices she is in the home stretch of her bariatric surgery journey.  She is hanging in in terms of her angst about the upcoming surgery.  Does not have a date yet for her surgery.  She has been doing the checklist of activities that need to get done prior to scheduling. She is going to start seeing her counselor every week in prep.  She has questions today about medication changes.  Alexandra Beck is here to discuss her progress with her obesity treatment plan. She is on the practicing portion control and making smarter food choices, such as increasing vegetables and decreasing simple carbohydrates and states she is following her eating plan approximately 100 % of the time. She states she is not exercising.   OBJECTIVE: Visit Diagnoses: Problem List Items Addressed This Visit       Endocrine   Diabetes mellitus (HCC) - Primary   On Ozempic 2mg  weekly.  She is following structured Category meal plan.  No GI side effects on the medication.  Needs refill today.  Last A1c of 6.1.        Relevant Medications   Semaglutide, 2 MG/DOSE, 8 MG/3ML SOPN     Other   Morbid obesity (HCC)   Relevant Medications   Semaglutide, 2 MG/DOSE, 8 MG/3ML SOPN   Vitamin D deficiency   On Rx Vitamin D and last lab done last month and was mid 30s.  Refill sent in today.  Patient was cleared to stop her Rx when she has bariatric surgery.      Relevant Medications   Vitamin D, Ergocalciferol, (DRISDOL) 1.25 MG (50000 UNIT) CAPS capsule   Other Visit Diagnoses       BMI 60.0-69.9, adult (HCC)       Relevant Medications   Semaglutide, 2 MG/DOSE, 8 MG/3ML SOPN       Vitals Temp: 98.4 F (36.9 C) BP: 132/70 Pulse Rate: 82 SpO2: 93 %   Anthropometric Measurements Height: 5\' 3"  (1.6 m) Weight: (!) 381 lb (172.8 kg) BMI (Calculated): 67.51 Weight at Last Visit: 400 lb Weight Lost Since Last Visit: 19 lb Weight Gained Since Last Visit:  0 Starting Weight: 468 lb Total Weight Loss (lbs): 87 lb (39.5 kg)   Body Composition  Body Fat %: 66.1 % Fat Mass (lbs): 252.4 lbs Muscle Mass (lbs): 122.8 lbs Total Body Water (lbs): 0 lbs Visceral Fat Rating : 31   Other Clinical Data Today's Visit #: 19 Starting Date: 04/17/18     ASSESSMENT AND PLAN:  Diet: Genesys is currently in the action stage of change. As such, her goal is to continue with weight loss efforts. She has agreed to  patient is following Category plan in prep for bariatric surgery- she will convert to liver shrinking plan 2 weeks prior to surgery .  Exercise: [name] has been instructed  to walk as tolerated to maintain mobility but our focus will be nutrition for time being. .   Behavior Modification:  We discussed the following Behavioral Modification Strategies today: increasing lean protein intake, increasing vegetables, meal planning and cooking strategies, better snacking choices, and planning for success.   No follow-ups on file.Marland Kitchen She was informed of the importance of frequent follow up visits to maximize her success with intensive lifestyle modifications for her multiple health conditions.  Attestation Statements:   Reviewed by clinician on day of visit: allergies, medications, problem list, medical history, surgical history, family history, social history, and  previous encounter notes.      Reuben Likes, MD

## 2023-12-25 NOTE — Assessment & Plan Note (Signed)
On Rx Vitamin D and last lab done last month and was mid 30s.  Refill sent in today.  Patient was cleared to stop her Rx when she has bariatric surgery.

## 2023-12-25 NOTE — Assessment & Plan Note (Signed)
On Ozempic 2mg  weekly.  She is following structured Category meal plan.  No GI side effects on the medication.  Needs refill today.  Last A1c of 6.1.

## 2023-12-26 ENCOUNTER — Other Ambulatory Visit (HOSPITAL_COMMUNITY): Payer: Self-pay

## 2023-12-27 ENCOUNTER — Other Ambulatory Visit: Payer: Self-pay | Admitting: Pulmonary Disease

## 2023-12-27 ENCOUNTER — Other Ambulatory Visit (HOSPITAL_COMMUNITY): Payer: Self-pay

## 2023-12-27 ENCOUNTER — Ambulatory Visit: Payer: Self-pay | Attending: Physician Assistant | Admitting: Physician Assistant

## 2023-12-27 DIAGNOSIS — Z01810 Encounter for preprocedural cardiovascular examination: Secondary | ICD-10-CM

## 2023-12-27 NOTE — Progress Notes (Signed)
   Virtual Visit via Telephone Note   Because of Alexandra Beck's co-morbid illnesses, she is at least at moderate risk for complications without adequate follow up.  This format is felt to be most appropriate for this patient at this time.  The patient did not have access to video technology/had technical difficulties with video requiring transitioning to audio format only (telephone).  All issues noted in this document were discussed and addressed.  No physical exam could be performed with this format.  Please refer to the patient's chart for her consent to telehealth for [facility].  Evaluation Performed:  Preoperative cardiovascular risk assessment _____________   Date:  12/27/2023   Patient ID:  Alexandra Beck, DOB 07-10-1981, MRN [id number] Patient Location:  [place] - [place] Provider location:   Office  Primary Care Provider:  Corwin Levins, MD Primary Cardiologist:  Lance Muss, MD  Chief Complaint / Patient Profile   43 y.o. 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  History of Present Illness    Alexandra Beck is a 43 y.o. female who presents via audio/video conferencing for a telehealth visit today.  Pt was last seen in cardiology clinic on 10/13/2023 by Ronie Spies, PA-C.  At that time Alexandra Beck was doing well.  The patient is now pending procedure as outlined above. Since her last visit, she has done well without chest discomfort, syncope or significant change in shortness of breath.  Her weights have been stable and she has not had any increased lower extremity edema.  Physical Exam    Vital Signs:  Alexandra Beck does not have vital signs available for review today.  Given telephonic nature of communication, physical exam is limited. AAOx3. NAD. Normal affect.  Speech and respirations are unlabored.  Accessory Clinical Findings    None  Assessment & Plan    Assessment & Plan Preoperative cardiovascular examination Ms. Kittler's perioperative risk of a major cardiac event is 6.6% according to the Revised Cardiac Risk Index (RCRI).  Therefore, she is at high risk for perioperative complications.   Her functional capacity is fair at 4.73 METs according to the Duke Activity Status Index (DASI). Recommendations: According to ACC/AHA guidelines, no further cardiovascular testing needed.  The patient may proceed to surgery at acceptable risk.     The patient was advised that if she develops new symptoms prior to surgery to contact our office to arrange for a follow-up visit, and she verbalized understanding.  A copy of this note will be routed to requesting surgeon.  Time:   Today, I have spent 6 minutes with the patient with telehealth technology discussing medical history, symptoms, and management plan.     Tereso Newcomer, PA-C 12/27/2023, 2:08 PM

## 2023-12-27 NOTE — Telephone Encounter (Signed)
Notes faxed to surgeon. This phone note will be removed from the preop pool. Tereso Newcomer, PA-C  12/27/2023 2:09 PM

## 2023-12-28 ENCOUNTER — Encounter: Payer: Self-pay | Admitting: Pharmacist

## 2023-12-28 ENCOUNTER — Other Ambulatory Visit (HOSPITAL_COMMUNITY): Payer: Self-pay

## 2023-12-28 ENCOUNTER — Other Ambulatory Visit: Payer: Self-pay

## 2023-12-28 MED ORDER — CEFADROXIL 500 MG PO CAPS
1.0000 g | ORAL_CAPSULE | Freq: Two times a day (BID) | ORAL | 1 refills | Status: DC
  Filled 2023-12-28 (×3): qty 40, 10d supply, fill #0
  Filled 2024-01-05: qty 40, 10d supply, fill #1

## 2023-12-28 NOTE — Addendum Note (Signed)
Addended by: Jeanine Luz D on: 12/28/2023 01:50 PM   Modules accepted: Orders

## 2024-01-01 NOTE — Progress Notes (Deleted)
 Cardiology Office Note    Date:  01/01/2024  ID:  Alexandra, Beck Dec 18, 1980, MRN [id number] PCP:  Norleen Lynwood ORN, MD  Cardiologist:  Candyce Reek, MD  Electrophysiologist:  None   Chief Complaint: ***  History of Present Illness: .    Alexandra Beck is a 43 y.o. female with visit-pertinent history of  DM2, HLD (not followed by cardiology), severe morbid obesity (BMI 75) with suspected OHS/OSA on CPAP, hypothyroidism, chronic HFpEF, recurrent cellulitis, depression, anemia back to 2022, recent admission for intra-abdominal abscess presents for cardiac follow-up. She has followed with Dr. Reek for history of HFpEF. Last echo 04/2022 showed EF 55-60%, normal RV, mild LAE, technically difficult due to habitus. She had remote ETT 2018 which was nondiagnostic due to failure to achieve THR, poor exercise tolerance with TWI at rest not changed with stress; occ PVCs. PVCs have otherwise not been a recent issue. She was last admitted to the hospital 08/2023 - her losartan , spironolactone  and torsemide  were all held and by the end of her stay she felt like she was retaining fluid so resumed them upon return home. Her renal function has been variable in the past most recently with Cr ranging from 1-1.4 in the last 2 years. . Her torsemide  is written as 40mg  BID PRN. She reports she and Dr. Reek previously settled on a regimen of taking a fixed dose in the morning then additional PRN in the afternoon if needed. She historically struggles taking it twice a day so has been doing 6 tablets (120mg ) once a day. At last visit we had her increase to 80mg  BID x 3 days then back to 120mg  daily. Weight has been a moving target. She also follows with [facility] and though she has been on Ozempic  for quite a while feels that she has not had the robust response to this as expected. Her wife is on Monjouro and has seen better results. She herself was on Monjouro a while ago and did well but then her insurance stopped  covering. She has seen an outside facility and is pursuing bariatric surgery. She had a telephone visit 12/27/23 with Glendia [name] PA-C with preop team and felt OK to proceed.  Ferritin  Chronic HFpEF Iron  deficiency HLD associated with DM Severe morbid obesity pending bariatric surgery   Labwork independently reviewed: 10/2023 iron  sat 5, iron  19, ferritin 33, A1C 6.1, K 4.6, Cr 1.16, albumin  3.7, AST ALT OK  ROS: .    Please see the history of present illness. Otherwise, review of systems is positive for ***.  All other systems are reviewed and otherwise negative.  Studies Reviewed: SABRA    EKG:  EKG is ordered today, personally reviewed, demonstrating ***  CV Studies: Cardiac studies reviewed are outlined and summarized above. Otherwise please see EMR for full report.   Current Reported Medications:.    No outpatient medications have been marked as taking for the 01/03/24 encounter (Appointment) with Mayu Ronk N, PA-C.    Physical Exam:    VS:  There were no vitals taken for this visit.   Wt Readings from Last 3 Encounters:  12/25/23 (!) 381 lb (172.8 kg)  11/13/23 (!) 400 lb (181.4 kg)  10/13/23 (!) 422 lb (191.4 kg)    GEN: Well nourished, well developed in no acute distress NECK: No JVD; No carotid bruits CARDIAC: ***RRR, no murmurs, rubs, gallops RESPIRATORY:  Clear to auscultation without rales, wheezing or rhonchi  ABDOMEN: Soft, non-tender, non-distended EXTREMITIES:  No edema; No acute deformity   Asessement and Plan:.     ***     Disposition: F/u with ***  Signed, Janiya Millirons N Charlize Hathaway, PA-C

## 2024-01-03 ENCOUNTER — Ambulatory Visit: Payer: Self-pay | Admitting: Physician Assistant

## 2024-01-03 ENCOUNTER — Ambulatory Visit (INDEPENDENT_AMBULATORY_CARE_PROVIDER_SITE_OTHER): Payer: Self-pay | Admitting: Psychology

## 2024-01-03 DIAGNOSIS — I5032 Chronic diastolic (congestive) heart failure: Secondary | ICD-10-CM

## 2024-01-03 DIAGNOSIS — F331 Major depressive disorder, recurrent, moderate: Secondary | ICD-10-CM

## 2024-01-03 DIAGNOSIS — E611 Iron deficiency: Secondary | ICD-10-CM

## 2024-01-03 DIAGNOSIS — F428 Other obsessive-compulsive disorder: Secondary | ICD-10-CM

## 2024-01-03 DIAGNOSIS — E785 Hyperlipidemia, unspecified: Secondary | ICD-10-CM

## 2024-01-03 NOTE — Progress Notes (Signed)
 [facility] Counselor/Therapist Progress Note  Patient ID: Alexandra Beck, MRN: [id number],    Date: 01/03/2024  Time spent: 64 minutes  Time In:  1:00 Time out:2:04  Treatment Type: Individual Therapy  Reported Symptoms: sadness,   Mental Status Exam: Appearance:  Casual     Behavior: Appropriate  Motor: Normal  Speech/Language:  Normal Rate  Affect: blunted  Mood: pleasant  Thought process: normal  Thought content:   WNL  Sensory/Perceptual disturbances:   WNL  Orientation: oriented to person, place, time/date, and situation  Attention: Good  Concentration: Good  Memory: WNL  Fund of knowledge:  Good  Insight:   Good  Judgment:  Good  Impulse Control: Good   Risk Assessment: Danger to Self:  No Self-injurious Behavior: No Danger to Others: No Duty to Warn:no Physical Aggression / Violence:No  Access to Firearms a concern: No  Gang Involvement:No   Subjective: The patient attended a face-to-face individual therapy session today via video visit .  The patient gave verbal consent for the video visit to be on Caregility and the patient is aware of the limitations of telehealth.  The patient was in her home alone and the therapist was in the office.  The patient presents as pleasant and cooperative.  The patient reports that she is doing better than she was the last time I saw her.  She does appear to be doing so.  We talked about her upcoming bariatric surgery and she is going through a lot of the process already so that they can schedule a surgery date pretty soon.  She and her partner seem to be doing better now and her not struggling quite as much.  I did explain that it is going to be important for her to continue to take care of herself and to be aware of when things are getting very stressful for her.  She is not very good at recognizing when she is overwhelmed.  We will continue to work with her on being able to do that and figuring out ways to offload her  stress.  She has begun straining again and that seems to be helping her as well as doing some art projects.  Interventions: Cognitive Behavioral Therapy, Assertiveness/Communication, and Problem solving, psycho education  Diagnosis:Obsessional thoughts  Major depressive disorder, recurrent episode, moderate (HCC)  Plan: Treatment Plan  Strengths/Abilities:  Intelligent, ability for insight, supportive wife  Treatment Preferences:  Outpatient Individual therapy  Statement of Needs:  I Need help with my depression and motivation.   Symptoms:Depressed or irritable mood.:(Status: improved). Diminished interest in or  enjoyment of activities.(Status: improved). Feelings of hopelessness,  worthlessness, or inappropriate guilt.: (Status: improved). History of chronic or recurrent depression for which the client has taken antidepressant medication, been hospitalized, had  outpatient treatment, or had a course of electroconvulsive therapy.(Status:  maintained). Lack of energy.: (Status: maintained). Low self-esteem.:  (Status: improved). Poor concentration and indecisiveness.:  (Status: improved). Social withdrawal.:  (Status: maintained).  Unresolved grief issues.: (Status: maintained).  Problems Addressed:  Unipolar Depression  Goals:  LTG:1. Alleviate depressive symptoms and return to previous level of effective  functioning.  70% 2. Appropriately grieve the loss in order to normalize mood and to return  to previously adaptive level of functioning. 3. Develop healthy interpersonal relationships that lead to the alleviation  and help prevent the relapse of depression.  70% 4. Develop healthy thinking patterns and beliefs about self, others, and the world that lead to the alleviation and  help prevent the relapse of  Depression  70% STG:1.Describe current and past experiences with depression including their impact on functioning and  attempts to resolve it.  70 % 2. Identify and  replace thoughts and beliefs that support depression.  70 % 3.Learn and implement behavioral strategies to overcome depression. 70% 4.Verbalize an understanding of healthy and unhealthy emotions with the intent of increasing the use of  healthy emotions to guide actions.  50 %  Target Date:  10/07/2024 Frequency: Bi Weekly Modality:Individual Interventions by Therapist:  CBT, problem solving, EMDR, insight oriented  Patient approved Treatment Plan  Darrion Macaulay [name], LCSW

## 2024-01-05 ENCOUNTER — Other Ambulatory Visit (HOSPITAL_COMMUNITY): Payer: Self-pay

## 2024-01-05 ENCOUNTER — Other Ambulatory Visit: Payer: Self-pay

## 2024-01-05 ENCOUNTER — Encounter (HOSPITAL_COMMUNITY): Payer: Self-pay

## 2024-01-08 NOTE — Telephone Encounter (Signed)
 Patient called answering service 01/05/2024 12:51pm and states she has been waiting a while for a new RX

## 2024-01-08 NOTE — Telephone Encounter (Signed)
Please advise refill request

## 2024-01-09 ENCOUNTER — Encounter (HOSPITAL_COMMUNITY): Payer: Self-pay

## 2024-01-09 ENCOUNTER — Other Ambulatory Visit: Payer: Self-pay | Admitting: Pulmonary Disease

## 2024-01-09 ENCOUNTER — Telehealth: Payer: Self-pay | Admitting: Pulmonary Disease

## 2024-01-09 ENCOUNTER — Other Ambulatory Visit (HOSPITAL_COMMUNITY): Payer: Self-pay

## 2024-01-09 NOTE — Telephone Encounter (Signed)
LMOM for patient to make appointment prior to filling medication. She can schedule with APP if needed.

## 2024-01-09 NOTE — Telephone Encounter (Signed)
Patient states both Modafinil need to be filled. Pharmacy is [facility]. Patient phone number is [phone].

## 2024-01-09 NOTE — Telephone Encounter (Signed)
Last seen 07/2022. She needs office visit before refill with me / APP

## 2024-01-10 ENCOUNTER — Encounter (HOSPITAL_BASED_OUTPATIENT_CLINIC_OR_DEPARTMENT_OTHER): Payer: Self-pay

## 2024-01-10 NOTE — Telephone Encounter (Signed)
LMOM and also sent detailed message about needing an appt prior to refill.

## 2024-01-16 ENCOUNTER — Ambulatory Visit
Admission: RE | Admit: 2024-01-16 | Discharge: 2024-01-16 | Disposition: A | Discharge status: Home / Self Care | Payer: 59 | Source: Ambulatory Visit | Attending: Family Medicine | Admitting: Family Medicine

## 2024-01-16 DIAGNOSIS — K3189 Other diseases of stomach and duodenum: Secondary | ICD-10-CM

## 2024-01-17 ENCOUNTER — Ambulatory Visit: Payer: 59 | Admitting: Psychology

## 2024-01-17 DIAGNOSIS — F428 Other obsessive-compulsive disorder: Secondary | ICD-10-CM | POA: Diagnosis not present

## 2024-01-17 DIAGNOSIS — F331 Major depressive disorder, recurrent, moderate: Secondary | ICD-10-CM

## 2024-01-17 NOTE — Progress Notes (Signed)
[facility] Counselor/Therapist Progress Note  Patient ID: Alexandra Beck, MRN: [id number],    Date: 01/17/2024  Time spent: 61 minutes  Time In:  1:04 Time out:2:05  Treatment Type: Individual Therapy  Reported Symptoms: sadness,   Mental Status Exam: Appearance:  Casual     Behavior: Appropriate  Motor: Normal  Speech/Language:  Normal Rate  Affect: blunted  Mood: pleasant  Thought process: normal  Thought content:   WNL  Sensory/Perceptual disturbances:   WNL  Orientation: oriented to person, place, time/date, and situation  Attention: Good  Concentration: Good  Memory: WNL  Fund of knowledge:  Good  Insight:   Good  Judgment:  Good  Impulse Control: Good   Risk Assessment: Danger to Self:  No Self-injurious Behavior: No Danger to Others: No Duty to Warn:no Physical Aggression / Violence:No  Access to Firearms a concern: No  Gang Involvement:No   Subjective: The patient attended a face-to-face individual therapy session today via video visit .  The patient gave verbal consent for the video visit to be on Caregility and the patient is aware of the limitations of telehealth.  The patient was in her home alone and the therapist was in the office.  The patient presents as pleasant and cooperative.  The patient reports that things are going well with the process of getting ready for her bariatric surgery.  The patient reports that she just had another test yesterday and she only has 1 more thing to do before they can talk about scheduling the procedure.  The patient does realize that it is just to told that she will have in her belt to be able to move in the right direction with her weight.  We talked about eating being an emotional addiction and we talked about how hard it is to change how you week.  She is trying to make that shift and she has done a good job of losing a great deal of weight already.  She continues to go to healthy weight and wellness and has lost  91 pounds since she started her journey.  She reports that she was able to get on the scale and it showed a number of today what was good.  She had not been able to use her own scale at home since she started the process and so she felt good about herself for being able to lose enough to be able to do that.  We talked about her being so much healthier once she had gotten the weight off.  She seems to be doing better in managing her emotions and we will continue to work with her on how to make this transition to a new body when the time comes. Interventions: Cognitive Behavioral Therapy, Assertiveness/Communication, and Problem solving, psycho education  Diagnosis:Obsessional thoughts  Major depressive disorder, recurrent episode, moderate (HCC)  Plan: Treatment Plan  Strengths/Abilities:  Intelligent, ability for insight, supportive wife  Treatment Preferences:  Outpatient Individual therapy  Statement of Needs:  "I Need help with my depression and motivation. "  Symptoms:Depressed or irritable mood.:(Status: improved). Diminished interest in or  enjoyment of activities.(Status: improved). Feelings of hopelessness,  worthlessness, or inappropriate guilt.: (Status: improved). History of chronic or recurrent depression for which the client has taken antidepressant medication, been hospitalized, had  outpatient treatment, or had a course of electroconvulsive therapy.(Status:  maintained). Lack of energy.: (Status: maintained). Low self-esteem.:  (Status: improved). Poor concentration and indecisiveness.:  (Status: improved). Social withdrawal.:  (Status: maintained).  Unresolved grief issues.: (Status: maintained).  Problems Addressed:  Unipolar Depression  Goals:  LTG:1. Alleviate depressive symptoms and return to previous level of effective  functioning.  70% 2. Appropriately grieve the loss in order to normalize mood and to return  to previously adaptive level of functioning. 3. Develop  healthy interpersonal relationships that lead to the alleviation  and help prevent the relapse of depression.  70% 4. Develop healthy thinking patterns and beliefs about self, others, and the world that lead to the alleviation and help prevent the relapse of  Depression  70% STG:1.Describe current and past experiences with depression including their impact on functioning and  attempts to resolve it.  70 % 2. Identify and replace thoughts and beliefs that support depression.  70 % 3.Learn and implement behavioral strategies to overcome depression. 70% 4.Verbalize an understanding of healthy and unhealthy emotions with the intent of increasing the use of  healthy emotions to guide actions.  50 %  Target Date:  10/07/2024 Frequency: Bi Weekly Modality:Individual Interventions by Therapist:  CBT, problem solving, EMDR, insight oriented  Patient approved Treatment Plan  Alexandra Beck Harrel Lemon, LCSW

## 2024-01-18 ENCOUNTER — Other Ambulatory Visit (HOSPITAL_COMMUNITY): Payer: Self-pay

## 2024-01-18 NOTE — Telephone Encounter (Signed)
PT has made an appt and wonders if it is now OK to have RX sent in. (See message below.)   Modafini 200 MG Modafini  100MG   Pharm is [facility]

## 2024-01-18 NOTE — Telephone Encounter (Signed)
 Lm for pt

## 2024-01-19 NOTE — Telephone Encounter (Signed)
Dr. Vassie Loll, The patient has a scheduled appointment with you on 02/27/24 and is wondering if her medication can be sent to the pharmacy now?  Thank you.  Modafini 200 MG Modafini  100MG    Pharm is [facility]

## 2024-01-20 ENCOUNTER — Other Ambulatory Visit (HOSPITAL_COMMUNITY): Payer: Self-pay

## 2024-01-20 MED ORDER — MODAFINIL 200 MG PO TABS
200.0000 mg | ORAL_TABLET | Freq: Every day | ORAL | 1 refills | Status: DC
  Filled 2024-01-20 – 2024-02-02 (×2): qty 30, 30d supply, fill #0

## 2024-01-20 MED ORDER — MODAFINIL 100 MG PO TABS
100.0000 mg | ORAL_TABLET | Freq: Every day | ORAL | 1 refills | Status: DC
  Filled 2024-01-20: qty 30, 30d supply, fill #0

## 2024-01-20 NOTE — Telephone Encounter (Signed)
 Refills have been sent.

## 2024-01-22 ENCOUNTER — Other Ambulatory Visit: Payer: Self-pay

## 2024-01-22 NOTE — Telephone Encounter (Signed)
 Pt.notified

## 2024-01-23 ENCOUNTER — Encounter: Payer: Self-pay | Admitting: Internal Medicine

## 2024-01-23 DIAGNOSIS — D509 Iron deficiency anemia, unspecified: Secondary | ICD-10-CM

## 2024-01-24 ENCOUNTER — Other Ambulatory Visit: Payer: Self-pay

## 2024-01-24 ENCOUNTER — Other Ambulatory Visit (HOSPITAL_COMMUNITY): Payer: Self-pay

## 2024-01-25 ENCOUNTER — Encounter: Payer: Self-pay | Admitting: Internal Medicine

## 2024-01-26 ENCOUNTER — Other Ambulatory Visit (HOSPITAL_COMMUNITY): Payer: Self-pay

## 2024-01-26 ENCOUNTER — Other Ambulatory Visit: Payer: Self-pay

## 2024-01-26 ENCOUNTER — Telehealth: Payer: Self-pay

## 2024-01-26 MED ORDER — OSELTAMIVIR PHOSPHATE 75 MG PO CAPS
75.0000 mg | ORAL_CAPSULE | Freq: Two times a day (BID) | ORAL | 0 refills | Status: AC
  Filled 2024-01-26: qty 10, 5d supply, fill #0

## 2024-01-26 MED ORDER — HYDROCODONE BIT-HOMATROP MBR 5-1.5 MG/5ML PO SOLN
5.0000 mL | Freq: Four times a day (QID) | ORAL | 0 refills | Status: AC | PRN
  Filled 2024-01-26: qty 180, 9d supply, fill #0

## 2024-01-26 NOTE — Telephone Encounter (Signed)
 Spoke with patient and confirmed visit on 01/29/24

## 2024-01-29 ENCOUNTER — Inpatient Hospital Stay: Payer: 59 | Attending: Hematology and Oncology | Admitting: Hematology and Oncology

## 2024-01-29 ENCOUNTER — Encounter: Payer: Self-pay | Admitting: Hematology and Oncology

## 2024-01-29 ENCOUNTER — Inpatient Hospital Stay: Payer: 59

## 2024-01-29 VITALS — BP 133/67 | HR 95 | Temp 97.2°F | Resp 18 | Wt 378.5 lb

## 2024-01-29 DIAGNOSIS — D509 Iron deficiency anemia, unspecified: Secondary | ICD-10-CM | POA: Diagnosis not present

## 2024-01-29 DIAGNOSIS — I5032 Chronic diastolic (congestive) heart failure: Secondary | ICD-10-CM | POA: Diagnosis not present

## 2024-01-29 DIAGNOSIS — G47419 Narcolepsy without cataplexy: Secondary | ICD-10-CM | POA: Insufficient documentation

## 2024-01-29 DIAGNOSIS — E119 Type 2 diabetes mellitus without complications: Secondary | ICD-10-CM | POA: Insufficient documentation

## 2024-01-29 DIAGNOSIS — I110 Hypertensive heart disease with heart failure: Secondary | ICD-10-CM | POA: Insufficient documentation

## 2024-01-29 DIAGNOSIS — E039 Hypothyroidism, unspecified: Secondary | ICD-10-CM | POA: Diagnosis not present

## 2024-01-29 LAB — CBC WITH DIFFERENTIAL/PLATELET
Abs Immature Granulocytes: 0.03 10*3/uL (ref 0.00–0.07)
Basophils Absolute: 0 10*3/uL (ref 0.0–0.1)
Basophils Relative: 0 %
Eosinophils Absolute: 0.1 10*3/uL (ref 0.0–0.5)
Eosinophils Relative: 2 %
HCT: 28.5 % — ABNORMAL LOW (ref 36.0–46.0)
Hemoglobin: 8.1 g/dL — ABNORMAL LOW (ref 12.0–15.0)
Immature Granulocytes: 0 %
Lymphocytes Relative: 15 %
Lymphs Abs: 1.1 10*3/uL (ref 0.7–4.0)
MCH: 20.9 pg — ABNORMAL LOW (ref 26.0–34.0)
MCHC: 28.4 g/dL — ABNORMAL LOW (ref 30.0–36.0)
MCV: 73.5 fL — ABNORMAL LOW (ref 80.0–100.0)
Monocytes Absolute: 0.5 10*3/uL (ref 0.1–1.0)
Monocytes Relative: 7 %
Neutro Abs: 5.6 10*3/uL (ref 1.7–7.7)
Neutrophils Relative %: 76 %
Platelets: 267 10*3/uL (ref 150–400)
RBC: 3.88 MIL/uL (ref 3.87–5.11)
RDW: 18.6 % — ABNORMAL HIGH (ref 11.5–15.5)
WBC: 7.3 10*3/uL (ref 4.0–10.5)
nRBC: 0 % (ref 0.0–0.2)

## 2024-01-29 LAB — FERRITIN: Ferritin: 21 ng/mL (ref 11–307)

## 2024-01-29 NOTE — Progress Notes (Signed)
 [facility] CONSULT NOTE  Patient Care Team: Corwin Levins, MD as PCP - General (Internal Medicine) Corky Crafts, MD as PCP - Cardiology (Cardiology) Nada Libman, MD (Vascular Surgery)  CHIEF COMPLAINTS/PURPOSE OF CONSULTATION:  Anemia,  ASSESSMENT & PLAN:   Iron Deficiency Anemia Chronic iron deficiency anemia with hemoglobin at 8 g/dL, decreased from 29.5 g/dL. Likely due to malabsorption and some from menstrual blood loss. Preparing for weight loss surgery; anemia correction needed pre-surgery. Ideally she will need to have a Hb of 10 or greater to decrease surgical risks. - Order ferritin level to assess iron stores. - Administer Venofer iron infusions in three divided doses of 300 mg each. - Schedule follow-up appointment three weeks after last iron infusion to assess hemoglobin levels with [facility] ( bariatric team) - Discuss potential side effects of iron infusions, including allergic reactions, flu-like symptoms, and headaches.  Hypertension Currently on metoprolol tartrate with dosing confusion. Taking once daily; tartrate typically requires twice daily dosing. - Instruct her to verify metoprolol formulation and dosing with primary care provider.  Diabetes Mellitus Diabetes is noted in her medical history.  Hypothyroidism Hypothyroidism is noted in her medical history. Thyroid function tests are normal.  Narcolepsy Narcolepsy is noted in her medical history, contributing to fatigue.  She had excellent questions today about the labs. She can RTC with [place] in 4/6 months with labs.  HISTORY OF PRESENTING ILLNESS:  Alexandra Beck 43 y.o. female is here because of anemia.   Discussed the use of AI scribe software for clinical note transcription with the patient, who gave verbal consent to proceed.  History of Present Illness    Alexandra Beck is a 43 year old female with iron deficiency anemia who presents for pre-surgical evaluation for weight loss  surgery.  She has a history of iron deficiency anemia, previously managed with oral iron supplements, which she discontinued approximately a year ago after her blood levels were deemed sufficient. Recent blood work from January 22, 2024, indicated a hemoglobin level of 8, down from 11. No heavy menstrual bleeding, gastrointestinal bleeding, or known malabsorption issues like celiac disease. A colonoscopy in December was unremarkable. She is hoping for gastric by pass surgery and her most recent Hb was 8, she was referred to hematology for anemia treatment so she can proceed with surgical clearance.  Her symptoms include fatigue, brittle nails, and a craving for ice, which she consumes frequently. She denies any blood loss in stool or black stool. She defines her menstrual cycles as light and last for 3 days.  Her past medical history includes diabetes, hypertension, hypothyroidism, and narcolepsy. Family history includes diabetes, hypertension, and thyroid disease.  All other systems were reviewed with the patient and are negative.  MEDICAL HISTORY:  Past Medical History:  Diagnosis Date   Acute renal failure (ARF) (HCC) 11/2012    multifactorial-likely secondary to ATN in the setting of sepsis and hypotension, and also from rhabdomyolysis   Anemia    Cellulitis    Chronic diastolic CHF (congestive heart failure) (HCC)    Depression    Diabetes mellitus (HCC)    GERD (gastroesophageal reflux disease)    Hyperlipidemia    Hypertension    Hypothyroidism    Morbid obesity with BMI of 70 and over, adult (HCC)    Obesity hypoventilation syndrome (HCC)    OSA (obstructive sleep apnea)    PVC's (premature ventricular contractions)    Recurrent cellulitis of lower leg  LLE, venous insuff   Sepsis (HCC) 11/2012   Secondary to cellulitis   Sleep apnea    Venous insufficiency of leg     SURGICAL HISTORY: Past Surgical History:  Procedure Laterality Date   IR FLUORO GUIDE CV MIDLINE PICC  RIGHT  03/28/2017   IR US GUIDE VASC ACCESS RIGHT  03/28/2017   WISDOM TOOTH EXTRACTION      SOCIAL HISTORY: Social History   Socioeconomic History   Marital status: Single    Spouse name: Not on file   Number of children: 0   Years of education: Not on file   Highest education level: Not on file  Occupational History   Occupation: Firefighter: [employer]  Tobacco Use   Smoking status: Former    Current packs/day: 0.00    Average packs/day: 0.5 packs/day for 8.0 years (4.0 ttl pk-yrs)    Types: Cigarettes    Start date: 2003    Quit date: 2011    Years since quitting: 14.1   Smokeless tobacco: Never  Vaping Use   Vaping status: Never Used  Substance and Sexual Activity   Alcohol use: No    Alcohol/week: 0.0 standard drinks of alcohol   Drug use: No   Sexual activity: Not on file  Other Topics Concern   Not on file  Social History Narrative   Librarian, 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)   Social Drivers of Corporate investment banker Strain: Not on file  Food Insecurity: No Food Insecurity (09/22/2023)   Hunger Vital Sign    Worried About Running Out of Food in the Last Year: Never true    Ran Out of Food in the Last Year: Never true  Transportation Needs: No Transportation Needs (09/22/2023)   PRAPARE - Administrator, Civil Service (Medical): No    Lack of Transportation (Non-Medical): No  Physical Activity: Not on file  Stress: Not on file  Social Connections: Not on file  Intimate Partner Violence: Not At Risk (09/22/2023)   Humiliation, Afraid, Rape, and Kick questionnaire    Fear of Current or Ex-Partner: No    Emotionally Abused: No    Physically Abused: No    Sexually Abused: No    FAMILY HISTORY: Family History  Problem Relation Age of Onset   Hypertension Mother    Diabetes Mother    High Cholesterol Mother    Thyroid disease Mother    Sleep apnea Mother    Obesity Mother    Hypertension Father    Heart  disease Father        before age 44   High Cholesterol Father    Sleep apnea Father    Obesity Father     ALLERGIES:  is allergic to aspirin, doxycycline, lisinopril, niacin, niacin and related, niaspan [niacin er (antihyperlipidemic)], wound dressing adhesive, and wound dressings.  MEDICATIONS:  Current Outpatient Medications  Medication Sig Dispense Refill   cefadroxil (DURICEF) 500 MG capsule Take 2 capsules (1,000 mg total) by mouth 2 (two) times daily. 40 capsule 1   HYDROcodone bit-homatropine (HYCODAN) 5-1.5 MG/5ML syrup Take 5 mLs by mouth every 6 (six) hours as needed for up to 10 days. 180 mL 0   lamoTRIgine (LAMICTAL) 200 MG tablet Take 1 tablet (200 mg total) by mouth daily. 90 tablet 1   levothyroxine (SYNTHROID) 150 MCG tablet Take 1 tablet (150 mcg total) by mouth daily before breakfast. Must keep scheduled appt for future refills  90 tablet 3   losartan (COZAAR) 100 MG tablet Take 1 tablet (100 mg total) by mouth daily. 90 tablet 3   metFORMIN (GLUCOPHAGE) 500 MG tablet Take 1 tablet (500 mg total) by mouth daily. Must keep scheduled appt for future refills 90 tablet 3   metoprolol tartrate (LOPRESSOR) 50 MG tablet Take 50 mg by mouth 2 (two) times daily.     modafinil (PROVIGIL) 100 MG tablet Take 1 tablet (100 mg total) by mouth daily. Take daily at 1230 with a 200mg  tablet. 30 tablet 1   modafinil (PROVIGIL) 200 MG tablet Take 1 tablet (200 mg total) by mouth daily. Take 1 tablet by mouth daily at 1230 with a 100mg  tablet 30 tablet 1   oseltamivir (TAMIFLU) 75 MG capsule Take 1 capsule (75 mg total) by mouth 2 (two) times daily for 5 days. 10 capsule 0   pantoprazole (PROTONIX) 40 MG tablet Take 1 tablet (40 mg total) by mouth daily before breakfast. 56 tablet 0   rosuvastatin (CRESTOR) 20 MG tablet Take 1 tablet (20 mg total) by mouth daily. 90 tablet 3   Semaglutide, 2 MG/DOSE, 8 MG/3ML SOPN Inject 2 mg into the skin as directed once a week. 9 mL 0   sertraline (ZOLOFT)  100 MG tablet Take 2 tablets (200 mg total) by mouth daily. 180 tablet 1   spironolactone (ALDACTONE) 25 MG tablet Take 1 tablet (25 mg total) by mouth daily. 90 tablet 3   torsemide (DEMADEX) 20 MG tablet Take 4 tablets (80 mg total) by mouth 2 (two) times daily as needed. 720 tablet 2   triamcinolone cream (KENALOG) 0.1 % Apply 1 Application topically 2 (two) times daily. (Patient taking differently: Apply 1 Application topically daily as needed (Apply to hands).) 30 g 1   Vitamin D, Ergocalciferol, (DRISDOL) 1.25 MG (50000 UNIT) CAPS capsule Take 1 capsule (50,000 Units total) by mouth every 7 (seven) days. 4 capsule 0   No current facility-administered medications for this visit.     PHYSICAL EXAMINATION: ECOG PERFORMANCE STATUS: 0 - Asymptomatic  Vitals:   01/29/24 1234  BP: 133/67  Pulse: 95  Resp: 18  Temp: (!) 97.2 F (36.2 C)  SpO2: 97%   Filed Weights   01/29/24 1234  Weight: (!) 378 lb 8 oz (171.7 kg)    GENERAL:alert, no distress and comfortable, morbidly obese. NECK: supple, thyroid normal size, non-tender, without nodularity LYMPH:  no palpable lymphadenopathy in the cervical, axillary  LUNGS: clear to auscultation and percussion with normal breathing effort HEART: regular rate & rhythm and no murmurs and no lower extremity edema PSYCH: alert & oriented x 3 with fluent speech   LABORATORY DATA:  I have reviewed the data as listed Lab Results  Component Value Date   WBC 7.0 10/13/2023   HGB 8.7 (L) 10/13/2023   HCT 27.4 (L) 11/13/2023   MCV 88 10/13/2023   PLT 316 10/13/2023     Chemistry      Component Value Date/Time   NA 142 11/13/2023 1325   K 4.6 11/13/2023 1325   CL 101 11/13/2023 1325   CO2 25 11/13/2023 1325   BUN 20 11/13/2023 1325   CREATININE 1.16 (H) 11/13/2023 1325   CREATININE 1.06 11/14/2016 1050      Component Value Date/Time   CALCIUM 9.7 11/13/2023 1325   CALCIUM 9.6 06/26/2007 0000   ALKPHOS 74 11/13/2023 1325   AST 13  11/13/2023 1325   ALT 12 11/13/2023 1325   BILITOT <0.2  11/13/2023 1325       RADIOGRAPHIC STUDIES: I have personally reviewed the radiological images as listed and agreed with the findings in the report. DG UGI W SINGLE CM (SOL OR THIN BA) Result Date: 01/16/2024 CLINICAL DATA:  Erosive gastropathy.  Preop for bariatric surgery. EXAM: UPPER GI SERIES WITH KUB TECHNIQUE: After obtaining a scout radiograph a routine upper GI series was performed using thin density barium. FLUOROSCOPY: Radiation Exposure Index (as provided by the fluoroscopic device): 137 mGy Kerma COMPARISON:  None Available. FINDINGS: No definite evidence mass or stricture is noted in the esophagus. Small sliding-type hiatal hernia is noted. No reflux is noted. Stomach is grossly unremarkable. Contrast flowed easily into the duodenum. Duodenal bulb and sweep are unremarkable. IMPRESSION: Small sliding-type hiatal hernia. No other abnormality seen in the esophagus, stomach or duodenum on this single contrast study. Electronically Signed   By: Lupita Raider M.D.   On: 01/16/2024 10:50    All questions were answered. The patient knows to call the clinic with any problems, questions or concerns. I spent 45 minutes in the care of this patient including H and P, review of records, counseling and coordination of care.     Rachel Moulds, MD 01/29/2024 12:39 PM

## 2024-01-31 ENCOUNTER — Ambulatory Visit: Payer: BC Managed Care – PPO | Admitting: Psychology

## 2024-01-31 DIAGNOSIS — F331 Major depressive disorder, recurrent, moderate: Secondary | ICD-10-CM | POA: Diagnosis not present

## 2024-01-31 DIAGNOSIS — F422 Mixed obsessional thoughts and acts: Secondary | ICD-10-CM

## 2024-01-31 DIAGNOSIS — F4310 Post-traumatic stress disorder, unspecified: Secondary | ICD-10-CM

## 2024-01-31 DIAGNOSIS — F428 Other obsessive-compulsive disorder: Secondary | ICD-10-CM
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
## 2024-02-02 ENCOUNTER — Other Ambulatory Visit (HOSPITAL_COMMUNITY): Payer: Self-pay

## 2024-02-02 ENCOUNTER — Other Ambulatory Visit: Payer: Self-pay

## 2024-02-05 ENCOUNTER — Other Ambulatory Visit (HOSPITAL_COMMUNITY): Payer: Self-pay

## 2024-02-05 ENCOUNTER — Encounter (INDEPENDENT_AMBULATORY_CARE_PROVIDER_SITE_OTHER): Payer: Self-pay | Admitting: Family Medicine

## 2024-02-05 ENCOUNTER — Inpatient Hospital Stay

## 2024-02-05 ENCOUNTER — Other Ambulatory Visit: Payer: Self-pay

## 2024-02-05 ENCOUNTER — Ambulatory Visit (INDEPENDENT_AMBULATORY_CARE_PROVIDER_SITE_OTHER): Payer: Self-pay | Admitting: Family Medicine

## 2024-02-05 VITALS — BP 126/77 | HR 74 | Temp 98.2°F | Ht 63.0 in | Wt 376.0 lb

## 2024-02-05 VITALS — BP 112/62 | HR 64 | Temp 98.2°F | Resp 20

## 2024-02-05 DIAGNOSIS — D509 Iron deficiency anemia, unspecified: Secondary | ICD-10-CM

## 2024-02-05 DIAGNOSIS — A4151 Sepsis due to Escherichia coli [E. coli]: Secondary | ICD-10-CM | POA: Diagnosis not present

## 2024-02-05 DIAGNOSIS — R0602 Shortness of breath: Secondary | ICD-10-CM | POA: Diagnosis not present

## 2024-02-05 DIAGNOSIS — E559 Vitamin D deficiency, unspecified: Secondary | ICD-10-CM

## 2024-02-05 DIAGNOSIS — D649 Anemia, unspecified: Secondary | ICD-10-CM

## 2024-02-05 DIAGNOSIS — Z6841 Body Mass Index (BMI) 40.0 and over, adult: Secondary | ICD-10-CM

## 2024-02-05 MED ORDER — VITAMIN D (ERGOCALCIFEROL) 1.25 MG (50000 UNIT) PO CAPS
50000.0000 [IU] | ORAL_CAPSULE | ORAL | 0 refills | Status: DC
  Filled 2024-02-05: qty 4, 28d supply, fill #0

## 2024-02-05 MED ORDER — IRON SUCROSE 20 MG/ML IV SOLN
300.0000 mg | Freq: Once | INTRAVENOUS | Status: AC
  Administered 2024-02-05: 300.0065 mg via INTRAVENOUS
  Filled 2024-02-05: qty 300

## 2024-02-05 MED ORDER — SODIUM CHLORIDE 0.9 % IV SOLN: INTRAVENOUS | Status: DC

## 2024-02-05 MED ORDER — SODIUM CHLORIDE 0.9% FLUSH: 3.0000 mL | Freq: Once | INTRAVENOUS | Status: DC | PRN

## 2024-02-05 NOTE — Progress Notes (Signed)
 First time Venofer, patient tolerated infusion without any distress. Observed for post infusion. Vital within normal limit, patient denies any distress.

## 2024-02-05 NOTE — Assessment & Plan Note (Signed)
 Doing well on prescription strength Vitamin D.  No nausea, vomiting or muscle weakness.  Needs refill today.  Will follow up level in June.

## 2024-02-05 NOTE — Assessment & Plan Note (Signed)
 Hemoglobin low at 8.1, MCV 73 and RDW elevated.  She is set up to start iron infusions today and then again in 3 weeks.  She is fatigued and short of breath. Will follow up on effects at next appointment to see how she feels after infusions.

## 2024-02-05 NOTE — Progress Notes (Signed)
   SUBJECTIVE:  Chief Complaint: Obesity  Interim History: Patient underwent fluoroscopy and isn't sure if she needs to undergo another swallow evaluation.  She got her thyroid rechecked as well as her iron, b12 and she is still significantly anemic.  She has infusions scheduled for today and 3 weeks from today.  Thinking it may be another 6 weeks before she can meet with the surgeon.  Overall she is feeling good about her upcoming bariatric surgery.  She is trying to acclimate herself to taking in protein shakes and protein options.   Aleysha is here to discuss her progress with her obesity treatment plan. She is on the Category 4 Plan and journaling and states she is following her eating plan approximately 75 % of the time. She states she is not exercising.   OBJECTIVE: Visit Diagnoses: Problem List Items Addressed This Visit       Other   Morbid obesity (HCC)   Vitamin D deficiency   Doing well on prescription strength Vitamin D.  No nausea, vomiting or muscle weakness.  Needs refill today.  Will follow up level in June.      Relevant Medications   Vitamin D, Ergocalciferol, (DRISDOL) 1.25 MG (50000 UNIT) CAPS capsule   Anemia - Primary   Hemoglobin low at 8.1, MCV 73 and RDW elevated.  She is set up to start iron infusions today and then again in 3 weeks.  She is fatigued and short of breath. Will follow up on effects at next appointment to see how she feels after infusions.      Other Visit Diagnoses       BMI 60.0-69.9, adult (HCC)           Vitals Temp: 98.2 F (36.8 C) BP: 126/77 Pulse Rate: 74 SpO2: 95 %   Anthropometric Measurements Height: 5\' 3"  (1.6 m) Weight: (!) 376 lb (170.6 kg) BMI (Calculated): 66.62 Weight at Last Visit: 381 lb Weight Lost Since Last Visit: 5 Weight Gained Since Last Visit: 0 Starting Weight: 468 lb Total Weight Loss (lbs): 92 lb (41.7 kg)   Body Composition  Body Fat %: 59.3 % Fat Mass (lbs): 223.2 lbs Muscle Mass (lbs): 145.6  lbs Visceral Fat Rating : 28   Other Clinical Data Today's Visit #: 62 Starting Date: 04/17/18 Comments: Cat 4, journal     ASSESSMENT AND PLAN:  Diet: Embry is currently in the action stage of change. As such, her goal is to continue with weight loss efforts and has agreed to keeping a food journal and adhering to recommended goals of 1800-2000 calories and 150 or more grams protein daily.   Exercise:  All adults should avoid inactivity. Some activity is better than none, and adults who participate in any amount of physical activity, gain some health benefits.  Behavior Modification:  We discussed the following Behavioral Modification Strategies today: increasing lean protein intake, increase H2O intake, meal planning and cooking strategies, better snacking choices, and planning for success.   Return in about 6 weeks (around 03/18/2024).Marland Kitchen She was informed of the importance of frequent follow up visits to maximize her success with intensive lifestyle modifications for her multiple health conditions.  Attestation Statements:   Reviewed by clinician on day of visit: allergies, medications, problem list, medical history, surgical history, family history, social history, and previous encounter notes.   Reuben Likes, MD

## 2024-02-05 NOTE — Patient Instructions (Signed)

## 2024-02-06 ENCOUNTER — Encounter: Payer: Self-pay | Admitting: Sports Medicine

## 2024-02-06 ENCOUNTER — Other Ambulatory Visit (HOSPITAL_COMMUNITY): Payer: Self-pay

## 2024-02-06 ENCOUNTER — Emergency Department (HOSPITAL_COMMUNITY)

## 2024-02-06 ENCOUNTER — Inpatient Hospital Stay (HOSPITAL_COMMUNITY)
Admission: EM | Admit: 2024-02-06 | Discharge: 2024-02-10 | DRG: 871 | Disposition: A | Discharge status: Home / Self Care | Attending: Internal Medicine | Admitting: Internal Medicine

## 2024-02-06 ENCOUNTER — Other Ambulatory Visit: Payer: Self-pay

## 2024-02-06 DIAGNOSIS — A4181 Sepsis due to Enterococcus: Secondary | ICD-10-CM | POA: Diagnosis present

## 2024-02-06 DIAGNOSIS — E1122 Type 2 diabetes mellitus with diabetic chronic kidney disease: Secondary | ICD-10-CM | POA: Diagnosis present

## 2024-02-06 DIAGNOSIS — D638 Anemia in other chronic diseases classified elsewhere: Secondary | ICD-10-CM | POA: Diagnosis present

## 2024-02-06 DIAGNOSIS — Z888 Allergy status to other drugs, medicaments and biological substances status: Secondary | ICD-10-CM

## 2024-02-06 DIAGNOSIS — N390 Urinary tract infection, site not specified: Secondary | ICD-10-CM | POA: Diagnosis present

## 2024-02-06 DIAGNOSIS — R519 Headache, unspecified: Secondary | ICD-10-CM | POA: Diagnosis present

## 2024-02-06 DIAGNOSIS — Z886 Allergy status to analgesic agent status: Secondary | ICD-10-CM

## 2024-02-06 DIAGNOSIS — Z8249 Family history of ischemic heart disease and other diseases of the circulatory system: Secondary | ICD-10-CM

## 2024-02-06 DIAGNOSIS — K5721 Diverticulitis of large intestine with perforation and abscess with bleeding: Secondary | ICD-10-CM | POA: Diagnosis present

## 2024-02-06 DIAGNOSIS — I152 Hypertension secondary to endocrine disorders: Secondary | ICD-10-CM | POA: Diagnosis present

## 2024-02-06 DIAGNOSIS — Z8349 Family history of other endocrine, nutritional and metabolic diseases: Secondary | ICD-10-CM

## 2024-02-06 DIAGNOSIS — A4151 Sepsis due to Escherichia coli [E. coli]: Principal | ICD-10-CM | POA: Diagnosis present

## 2024-02-06 DIAGNOSIS — Z833 Family history of diabetes mellitus: Secondary | ICD-10-CM

## 2024-02-06 DIAGNOSIS — A419 Sepsis, unspecified organism: Secondary | ICD-10-CM | POA: Diagnosis present

## 2024-02-06 DIAGNOSIS — Z87891 Personal history of nicotine dependence: Secondary | ICD-10-CM

## 2024-02-06 DIAGNOSIS — Z83438 Family history of other disorder of lipoprotein metabolism and other lipidemia: Secondary | ICD-10-CM

## 2024-02-06 DIAGNOSIS — Z79899 Other long term (current) drug therapy: Secondary | ICD-10-CM

## 2024-02-06 DIAGNOSIS — I5032 Chronic diastolic (congestive) heart failure: Secondary | ICD-10-CM | POA: Diagnosis present

## 2024-02-06 DIAGNOSIS — E039 Hypothyroidism, unspecified: Secondary | ICD-10-CM | POA: Diagnosis present

## 2024-02-06 DIAGNOSIS — F32A Depression, unspecified: Secondary | ICD-10-CM | POA: Diagnosis present

## 2024-02-06 DIAGNOSIS — K5720 Diverticulitis of large intestine with perforation and abscess without bleeding: Secondary | ICD-10-CM | POA: Diagnosis present

## 2024-02-06 DIAGNOSIS — E1169 Type 2 diabetes mellitus with other specified complication: Secondary | ICD-10-CM | POA: Diagnosis present

## 2024-02-06 DIAGNOSIS — E662 Morbid (severe) obesity with alveolar hypoventilation: Secondary | ICD-10-CM | POA: Diagnosis present

## 2024-02-06 DIAGNOSIS — R6520 Severe sepsis without septic shock: Secondary | ICD-10-CM | POA: Diagnosis present

## 2024-02-06 DIAGNOSIS — N3090 Cystitis, unspecified without hematuria: Secondary | ICD-10-CM | POA: Diagnosis present

## 2024-02-06 DIAGNOSIS — Z79890 Hormone replacement therapy (postmenopausal): Secondary | ICD-10-CM

## 2024-02-06 DIAGNOSIS — R0902 Hypoxemia: Secondary | ICD-10-CM | POA: Diagnosis present

## 2024-02-06 DIAGNOSIS — G4733 Obstructive sleep apnea (adult) (pediatric): Secondary | ICD-10-CM | POA: Diagnosis present

## 2024-02-06 DIAGNOSIS — Z9884 Bariatric surgery status: Secondary | ICD-10-CM

## 2024-02-06 DIAGNOSIS — E785 Hyperlipidemia, unspecified: Secondary | ICD-10-CM | POA: Diagnosis present

## 2024-02-06 DIAGNOSIS — N179 Acute kidney failure, unspecified: Secondary | ICD-10-CM | POA: Diagnosis present

## 2024-02-06 DIAGNOSIS — N10 Acute tubulo-interstitial nephritis: Secondary | ICD-10-CM | POA: Diagnosis present

## 2024-02-06 DIAGNOSIS — E1159 Type 2 diabetes mellitus with other circulatory complications: Secondary | ICD-10-CM | POA: Diagnosis present

## 2024-02-06 DIAGNOSIS — Z6841 Body Mass Index (BMI) 40.0 and over, adult: Secondary | ICD-10-CM

## 2024-02-06 DIAGNOSIS — Z7984 Long term (current) use of oral hypoglycemic drugs: Secondary | ICD-10-CM

## 2024-02-06 DIAGNOSIS — E876 Hypokalemia: Secondary | ICD-10-CM | POA: Diagnosis present

## 2024-02-06 DIAGNOSIS — N321 Vesicointestinal fistula: Secondary | ICD-10-CM | POA: Diagnosis present

## 2024-02-06 DIAGNOSIS — Z1152 Encounter for screening for COVID-19: Secondary | ICD-10-CM

## 2024-02-06 DIAGNOSIS — Z881 Allergy status to other antibiotic agents status: Secondary | ICD-10-CM

## 2024-02-06 DIAGNOSIS — K5792 Diverticulitis of intestine, part unspecified, without perforation or abscess without bleeding: Principal | ICD-10-CM

## 2024-02-06 DIAGNOSIS — K219 Gastro-esophageal reflux disease without esophagitis: Secondary | ICD-10-CM | POA: Diagnosis present

## 2024-02-06 LAB — CBC WITH DIFFERENTIAL/PLATELET
Abs Immature Granulocytes: 0.11 10*3/uL — ABNORMAL HIGH (ref 0.00–0.07)
Basophils Absolute: 0 10*3/uL (ref 0.0–0.1)
Basophils Relative: 0 %
Eosinophils Absolute: 0 10*3/uL (ref 0.0–0.5)
Eosinophils Relative: 0 %
HCT: 26.7 % — ABNORMAL LOW (ref 36.0–46.0)
Hemoglobin: 7.8 g/dL — ABNORMAL LOW (ref 12.0–15.0)
Immature Granulocytes: 1 %
Lymphocytes Relative: 2 %
Lymphs Abs: 0.3 10*3/uL — ABNORMAL LOW (ref 0.7–4.0)
MCH: 21.2 pg — ABNORMAL LOW (ref 26.0–34.0)
MCHC: 29.2 g/dL — ABNORMAL LOW (ref 30.0–36.0)
MCV: 72.6 fL — ABNORMAL LOW (ref 80.0–100.0)
Monocytes Absolute: 0.1 10*3/uL (ref 0.1–1.0)
Monocytes Relative: 1 %
Neutro Abs: 11.9 10*3/uL — ABNORMAL HIGH (ref 1.7–7.7)
Neutrophils Relative %: 96 %
Platelets: 300 10*3/uL (ref 150–400)
RBC: 3.68 MIL/uL — ABNORMAL LOW (ref 3.87–5.11)
RDW: 19.6 % — ABNORMAL HIGH (ref 11.5–15.5)
WBC: 12.5 10*3/uL — ABNORMAL HIGH (ref 4.0–10.5)
nRBC: 0 % (ref 0.0–0.2)

## 2024-02-06 LAB — COMPREHENSIVE METABOLIC PANEL
ALT: 13 U/L (ref 0–44)
AST: 22 U/L (ref 15–41)
Albumin: 3.1 g/dL — ABNORMAL LOW (ref 3.5–5.0)
Alkaline Phosphatase: 83 U/L (ref 38–126)
Anion gap: 13 (ref 5–15)
BUN: 23 mg/dL — ABNORMAL HIGH (ref 6–20)
CO2: 23 mmol/L (ref 22–32)
Calcium: 8.9 mg/dL (ref 8.9–10.3)
Chloride: 102 mmol/L (ref 98–111)
Creatinine, Ser: 1.34 mg/dL — ABNORMAL HIGH (ref 0.44–1.00)
GFR, Estimated: 50 mL/min — ABNORMAL LOW (ref >60)
Glucose, Bld: 144 mg/dL — ABNORMAL HIGH (ref 70–99)
Potassium: 3.4 mmol/L — ABNORMAL LOW (ref 3.5–5.1)
Sodium: 138 mmol/L (ref 135–145)
Total Bilirubin: 1 mg/dL (ref 0.0–1.2)
Total Protein: 7.1 g/dL (ref 6.5–8.1)

## 2024-02-06 LAB — PROTIME-INR
INR: 1.2 (ref 0.8–1.2)
Prothrombin Time: 15 s (ref 11.4–15.2)

## 2024-02-06 LAB — APTT: aPTT: 28 s (ref 24–36)

## 2024-02-06 LAB — URINALYSIS, W/ REFLEX TO CULTURE (INFECTION SUSPECTED)
Bilirubin Urine: NEGATIVE
Glucose, UA: NEGATIVE mg/dL
Ketones, ur: NEGATIVE mg/dL
Nitrite: NEGATIVE
Protein, ur: 100 mg/dL — AB
RBC / HPF: >50 RBC/hpf (ref 0–5)
Specific Gravity, Urine: 1.025 (ref 1.005–1.030)
WBC, UA: >50 WBC/hpf (ref 0–5)
pH: 5 (ref 5.0–8.0)

## 2024-02-06 LAB — I-STAT CG4 LACTIC ACID, ED
Lactic Acid, Venous: 4.1 mmol/L (ref 0.5–1.9)
Lactic Acid, Venous: 1.4 mmol/L (ref 0.5–1.9)

## 2024-02-06 LAB — RESP PANEL BY RT-PCR (RSV, FLU A&B, COVID)  RVPGX2
Influenza A by PCR: NEGATIVE
Influenza B by PCR: NEGATIVE
Resp Syncytial Virus by PCR: NEGATIVE
SARS Coronavirus 2 by RT PCR: NEGATIVE

## 2024-02-06 LAB — HCG, SERUM, QUALITATIVE: Preg, Serum: NEGATIVE

## 2024-02-06 LAB — BRAIN NATRIURETIC PEPTIDE: B Natriuretic Peptide: 172.8 pg/mL — ABNORMAL HIGH (ref 0.0–100.0)

## 2024-02-06 MED ORDER — ACETAMINOPHEN 325 MG PO TABS
325.0000 mg | ORAL_TABLET | Freq: Once | ORAL | Status: AC
  Administered 2024-02-06: 325 mg via ORAL
  Filled 2024-02-06: qty 1

## 2024-02-06 MED ORDER — METRONIDAZOLE 500 MG/100ML IV SOLN
500.0000 mg | Freq: Once | INTRAVENOUS | Status: AC
  Administered 2024-02-06: 500 mg via INTRAVENOUS
  Filled 2024-02-06: qty 100

## 2024-02-06 MED ORDER — SODIUM CHLORIDE 0.9 % IV BOLUS
500.0000 mL | Freq: Once | INTRAVENOUS | Status: AC
  Administered 2024-02-06: 500 mL via INTRAVENOUS

## 2024-02-06 MED ORDER — SODIUM CHLORIDE 0.9 % IV SOLN
2.0000 g | Freq: Once | INTRAVENOUS | Status: AC
  Administered 2024-02-06: 2 g via INTRAVENOUS
  Filled 2024-02-06: qty 20

## 2024-02-06 MED ORDER — SODIUM CHLORIDE 0.9 % IV SOLN
500.0000 mg | Freq: Once | INTRAVENOUS | Status: AC
  Administered 2024-02-06: 500 mg via INTRAVENOUS
  Filled 2024-02-06: qty 5

## 2024-02-06 MED ORDER — SODIUM CHLORIDE 0.9 % IV BOLUS
1000.0000 mL | Freq: Once | INTRAVENOUS | Status: AC
  Administered 2024-02-06: 1000 mL via INTRAVENOUS

## 2024-02-06 MED ORDER — IOHEXOL 300 MG/ML  SOLN
80.0000 mL | Freq: Once | INTRAMUSCULAR | Status: AC | PRN
  Administered 2024-02-06: 80 mL via INTRAVENOUS

## 2024-02-06 NOTE — Sepsis Progress Note (Signed)
 Elink monitoring for the code sepsis protocol.

## 2024-02-06 NOTE — ED Notes (Signed)
 RE TOOK HER VITALS AGAIN BECAUSE IT DIDN'T SEEM RIGHT

## 2024-02-06 NOTE — ED Provider Notes (Signed)
 [facility]ERGENCY DEPARTMENT AT [facility] Provider Note   CSN: [id number] Arrival date & time: 02/06/24  1849     History  Chief Complaint  Patient presents with   Shortness of Breath    Alexandra Beck is a 43 y.o. female.  HPI 43 year old female presents with a fever.  She started having a fever last night that was more low-grade around low 100.  However all day today she has been having higher fevers.  She took Tylenol this morning.  She is also had a cough and shortness of breath and EMS found her to have O2 sats in the mid to high 80s.  They gave her 650 of Tylenol as well.  Patient has had a headache and cannot stop feeling cold.  She denies a sore throat or vomiting.  She is also having abdominal pain which she thinks might be from the coughing.  She received her first iron infusion yesterday. She reports she is not allowed to take Ibuprofen/NSAIDs.  Home Medications Prior to Admission medications   Medication Sig Start Date End Date Taking? Authorizing Provider  cefadroxil (DURICEF) 500 MG capsule Take 2 capsules (1,000 mg total) by mouth 2 (two) times daily. 12/28/23   Veryl Speak, FNP  lamoTRIgine (LAMICTAL) 200 MG tablet Take 1 tablet (200 mg total) by mouth daily. 11/24/23   Mozingo, Thereasa Solo, NP  levothyroxine (SYNTHROID) 150 MCG tablet Take 1 tablet (150 mcg total) by mouth daily before breakfast. Must keep scheduled appt for future refills 06/30/23   Corwin Levins, MD  losartan (COZAAR) 100 MG tablet Take 1 tablet (100 mg total) by mouth daily. 11/06/23   Dunn, Tacey Ruiz, PA-C  metFORMIN (GLUCOPHAGE) 500 MG tablet Take 1 tablet (500 mg total) by mouth daily. Must keep scheduled appt for future refills 06/30/23   Corwin Levins, MD  metoprolol tartrate (LOPRESSOR) 50 MG tablet Take 50 mg by mouth 2 (two) times daily. Metoprolol succinate 100mg     [provider]  modafinil (PROVIGIL) 100 MG tablet Take 1 tablet (100 mg total) by mouth daily.  Take daily at 1230 with a 200mg  tablet. 01/20/24   Oretha Milch, MD  modafinil (PROVIGIL) 200 MG tablet Take 1 tablet (200 mg total) by mouth daily. Take 1 tablet by mouth daily at 1230 with a 100mg  tablet 01/20/24   Oretha Milch, MD  rosuvastatin (CRESTOR) 20 MG tablet Take 1 tablet (20 mg total) by mouth daily. 06/30/23   Corwin Levins, MD  Semaglutide, 2 MG/DOSE, 8 MG/3ML SOPN Inject 2 mg into the skin as directed once a week. 12/25/23   Langston Reusing, MD  sertraline (ZOLOFT) 100 MG tablet Take 2 tablets (200 mg total) by mouth daily. 11/24/23   Mozingo, Thereasa Solo, NP  spironolactone (ALDACTONE) 25 MG tablet Take 1 tablet (25 mg total) by mouth daily. 06/30/23   Corwin Levins, MD  torsemide (DEMADEX) 20 MG tablet Take 4 tablets (80 mg total) by mouth 2 (two) times daily as needed. 10/23/23   Dunn, Tacey Ruiz, PA-C  triamcinolone cream (KENALOG) 0.1 % Apply 1 Application topically 2 (two) times daily. Patient taking differently: Apply 1 Application topically daily as needed (Apply to hands). 04/12/23 04/11/24  Corwin Levins, MD  Vitamin D, Ergocalciferol, (DRISDOL) 1.25 MG (50000 UNIT) CAPS capsule Take 1 capsule (50,000 Units total) by mouth every 7 (seven) days. 02/05/24   Langston Reusing, MD  Allergies    Aspirin, Doxycycline, Lisinopril, Niacin, Niacin and related, Niaspan [niacin er (antihyperlipidemic)], Wound dressing adhesive, and Wound dressings    Review of Systems   Review of Systems  Constitutional:  Positive for chills and fever.  HENT:  Negative for sore throat.   Respiratory:  Positive for cough and shortness of breath.   Cardiovascular:  Negative for chest pain.  Gastrointestinal:  Positive for abdominal pain. Negative for vomiting.  Neurological:  Positive for headaches.    Physical Exam Updated Vital Signs BP (!) 108/44 (BP Location: Right Wrist)   Pulse 99   Temp (!) 101.4 F (38.6 C) (Oral)   Resp 15   SpO2 96%  Physical Exam Vitals and nursing  note reviewed.  Constitutional:      Appearance: She is well-developed.  HENT:     Head: Normocephalic and atraumatic.  Cardiovascular:     Rate and Rhythm: Regular rhythm. Tachycardia present.     Heart sounds: Normal heart sounds.  Pulmonary:     Effort: Pulmonary effort is normal. No accessory muscle usage or respiratory distress.     Breath sounds: Rales present.  Abdominal:     Palpations: Abdomen is soft.     Tenderness: There is abdominal tenderness.  Skin:    General: Skin is warm and dry.  Neurological:     Mental Status: She is alert.     ED Results / Procedures / Treatments   Labs (all labs ordered are listed, but only abnormal results are displayed) Labs Reviewed  COMPREHENSIVE METABOLIC PANEL - Abnormal; Notable for the following components:      Result Value   Potassium 3.4 (*)    Glucose, Bld 144 (*)    BUN 23 (*)    Creatinine, Ser 1.34 (*)    Albumin 3.1 (*)    GFR, Estimated 50 (*)    All other components within normal limits  CBC WITH DIFFERENTIAL/PLATELET - Abnormal; Notable for the following components:   WBC 12.5 (*)    RBC 3.68 (*)    Hemoglobin 7.8 (*)    HCT 26.7 (*)    MCV 72.6 (*)    MCH 21.2 (*)    MCHC 29.2 (*)    RDW 19.6 (*)    Neutro Abs 11.9 (*)    Lymphs Abs 0.3 (*)    Abs Immature Granulocytes 0.11 (*)    All other components within normal limits  BRAIN NATRIURETIC PEPTIDE - Abnormal; Notable for the following components:   B Natriuretic Peptide 172.8 (*)    All other components within normal limits  I-STAT CG4 LACTIC ACID, ED - Abnormal; Notable for the following components:   Lactic Acid, Venous 4.1 (*)    All other components within normal limits  RESP PANEL BY RT-PCR (RSV, FLU A&B, COVID)  RVPGX2  CULTURE, BLOOD (ROUTINE X 2)  CULTURE, BLOOD (ROUTINE X 2)  PROTIME-INR  APTT  HCG, SERUM, QUALITATIVE  URINALYSIS, W/ REFLEX TO CULTURE (INFECTION SUSPECTED)  I-STAT CG4 LACTIC ACID, ED    EKG EKG  Interpretation Date/Time:  Tuesday February 06 2024 20:01:55 EDT Ventricular Rate:  111 PR Interval:  152 QRS Duration:  99 QT Interval:  334 QTC Calculation: 454 R Axis:   12  Text Interpretation: Sinus tachycardia Low voltage, precordial leads Nonspecific T abnormalities, lateral leads Confirmed by Pricilla Loveless [phone]) on 02/06/2024 11:01:47 PM  Radiology DG Chest Port 1 View Result Date: 02/06/2024 CLINICAL DATA:  Cough and fever. EXAM: PORTABLE CHEST 1  VIEW COMPARISON:  None Available. FINDINGS: The heart is upper normal in size. The cardiomediastinal contours are normal. Subsegmental atelectasis in the right lower lung zone. Pulmonary vasculature is normal. No consolidation, pleural effusion, or pneumothorax. No acute osseous abnormalities are seen. IMPRESSION: Subsegmental atelectasis in the right lower lung zone. Electronically Signed   By: Narda Rutherford M.D.   On: 02/06/2024 21:49    Procedures .Critical Care  Performed by: Pricilla Loveless, MD Authorized by: Pricilla Loveless, MD   Critical care provider statement:    Critical care time (minutes):  35   Critical care time was exclusive of:  Separately billable procedures and treating other patients   Critical care was necessary to treat or prevent imminent or life-threatening deterioration of the following conditions:  Sepsis and respiratory failure   Critical care was time spent personally by me on the following activities:  Development of treatment plan with patient or surrogate, discussions with consultants, evaluation of patient's response to treatment, examination of patient, ordering and review of laboratory studies, ordering and review of radiographic studies, ordering and performing treatments and interventions, pulse oximetry, re-evaluation of patient's condition and review of old charts     Medications Ordered in ED Medications  sodium chloride 0.9 % bolus 1,000 mL (has no administration in time range)  acetaminophen  (TYLENOL) tablet 325 mg (325 mg Oral Given 02/06/24 1953)  cefTRIAXone (ROCEPHIN) 2 g in sodium chloride 0.9 % 100 mL IVPB (0 g Intravenous Stopped 02/06/24 2117)  azithromycin (ZITHROMAX) 500 mg in sodium chloride 0.9 % 250 mL IVPB (0 mg Intravenous Stopped 02/06/24 2117)  sodium chloride 0.9 % bolus 500 mL (0 mLs Intravenous Stopped 02/06/24 2250)  iohexol (OMNIPAQUE) 300 MG/ML solution 80 mL (80 mLs Intravenous Contrast Given 02/06/24 2108)  metroNIDAZOLE (FLAGYL) IVPB 500 mg (0 mg Intravenous Stopped 02/06/24 2250)    ED Course/ Medical Decision Making/ A&P                                 Medical Decision Making Amount and/or Complexity of Data Reviewed Labs: ordered.    Details: Leukocytosis, lactate 4 Radiology: ordered and independent interpretation performed.    Details: No lobar pneumonia ECG/medicine tests: ordered and independent interpretation performed.    Details: Sinus tachycardia  Risk OTC drugs. Prescription drug management.   Patient presents with high fever.  She was given IV fluids though gently at first given her concern about fluid overload.  However she does not appear fluid overloaded on exam and so we will give her another liter in addition to the 500 she received.  She received pneumonia type antibiotics but now that she is also having abdominal pain I have also added on Flagyl.  She is overall well-appearing.  Does have some soft blood pressures but mental status is great and she otherwise looks better than when she first arrived now that her fever has defervesced.  CT abdomen and pelvis has been pending and currently waiting on radiology read (I have called for a more urgent interpretation).   Care transferred to Dr. Wilkie Aye.        Final Clinical Impression(s) / ED Diagnoses Final diagnoses:  None    Rx / DC Orders ED Discharge Orders     None         Pricilla Loveless, MD 02/06/24 2317

## 2024-02-06 NOTE — ED Triage Notes (Signed)
 Pt BIB [facility] from home. Pt had fever and chills starting at 4:30p, upon ems arrival pt sats 88% on RA. Pt 93% on 3L [place]. Pt also co generalized pain/weakness. Pt AOx4  140/60 128HR 102.1 T  EMS administered 650 tylenol

## 2024-02-07 ENCOUNTER — Encounter (HOSPITAL_COMMUNITY): Payer: Self-pay | Admitting: Internal Medicine

## 2024-02-07 ENCOUNTER — Ambulatory Visit: Admitting: Sports Medicine

## 2024-02-07 DIAGNOSIS — F32A Depression, unspecified: Secondary | ICD-10-CM | POA: Diagnosis present

## 2024-02-07 DIAGNOSIS — R519 Headache, unspecified: Secondary | ICD-10-CM | POA: Diagnosis present

## 2024-02-07 DIAGNOSIS — N321 Vesicointestinal fistula: Secondary | ICD-10-CM | POA: Diagnosis present

## 2024-02-07 DIAGNOSIS — R6520 Severe sepsis without septic shock: Secondary | ICD-10-CM | POA: Diagnosis present

## 2024-02-07 DIAGNOSIS — R0602 Shortness of breath: Secondary | ICD-10-CM | POA: Diagnosis present

## 2024-02-07 DIAGNOSIS — Z6841 Body Mass Index (BMI) 40.0 and over, adult: Secondary | ICD-10-CM | POA: Diagnosis not present

## 2024-02-07 DIAGNOSIS — E876 Hypokalemia: Secondary | ICD-10-CM | POA: Diagnosis present

## 2024-02-07 DIAGNOSIS — Z7984 Long term (current) use of oral hypoglycemic drugs: Secondary | ICD-10-CM | POA: Diagnosis not present

## 2024-02-07 DIAGNOSIS — A4151 Sepsis due to Escherichia coli [E. coli]: Secondary | ICD-10-CM | POA: Diagnosis present

## 2024-02-07 DIAGNOSIS — A419 Sepsis, unspecified organism: Secondary | ICD-10-CM

## 2024-02-07 DIAGNOSIS — K219 Gastro-esophageal reflux disease without esophagitis: Secondary | ICD-10-CM | POA: Diagnosis present

## 2024-02-07 DIAGNOSIS — E785 Hyperlipidemia, unspecified: Secondary | ICD-10-CM | POA: Diagnosis present

## 2024-02-07 DIAGNOSIS — R509 Fever, unspecified: Secondary | ICD-10-CM | POA: Diagnosis not present

## 2024-02-07 DIAGNOSIS — I5032 Chronic diastolic (congestive) heart failure: Secondary | ICD-10-CM | POA: Diagnosis present

## 2024-02-07 DIAGNOSIS — N10 Acute tubulo-interstitial nephritis: Secondary | ICD-10-CM | POA: Diagnosis present

## 2024-02-07 DIAGNOSIS — N3090 Cystitis, unspecified without hematuria: Secondary | ICD-10-CM | POA: Diagnosis present

## 2024-02-07 DIAGNOSIS — Z79890 Hormone replacement therapy (postmenopausal): Secondary | ICD-10-CM | POA: Diagnosis not present

## 2024-02-07 DIAGNOSIS — E662 Morbid (severe) obesity with alveolar hypoventilation: Secondary | ICD-10-CM | POA: Diagnosis present

## 2024-02-07 DIAGNOSIS — N179 Acute kidney failure, unspecified: Secondary | ICD-10-CM | POA: Diagnosis present

## 2024-02-07 DIAGNOSIS — N390 Urinary tract infection, site not specified: Secondary | ICD-10-CM | POA: Diagnosis present

## 2024-02-07 DIAGNOSIS — R0902 Hypoxemia: Secondary | ICD-10-CM | POA: Diagnosis present

## 2024-02-07 DIAGNOSIS — Z1152 Encounter for screening for COVID-19: Secondary | ICD-10-CM | POA: Diagnosis not present

## 2024-02-07 DIAGNOSIS — I152 Hypertension secondary to endocrine disorders: Secondary | ICD-10-CM | POA: Diagnosis present

## 2024-02-07 DIAGNOSIS — A4181 Sepsis due to Enterococcus: Secondary | ICD-10-CM | POA: Diagnosis present

## 2024-02-07 DIAGNOSIS — D638 Anemia in other chronic diseases classified elsewhere: Secondary | ICD-10-CM | POA: Diagnosis present

## 2024-02-07 DIAGNOSIS — K5721 Diverticulitis of large intestine with perforation and abscess with bleeding: Secondary | ICD-10-CM | POA: Diagnosis present

## 2024-02-07 DIAGNOSIS — E1159 Type 2 diabetes mellitus with other circulatory complications: Secondary | ICD-10-CM | POA: Diagnosis present

## 2024-02-07 DIAGNOSIS — E039 Hypothyroidism, unspecified: Secondary | ICD-10-CM | POA: Diagnosis present

## 2024-02-07 LAB — BLOOD CULTURE ID PANEL (REFLEXED) - BCID2

## 2024-02-07 LAB — COMPREHENSIVE METABOLIC PANEL
ALT: 12 U/L (ref 0–44)
AST: 17 U/L (ref 15–41)
Albumin: 3 g/dL — ABNORMAL LOW (ref 3.5–5.0)
Alkaline Phosphatase: 81 U/L (ref 38–126)
Anion gap: 10 (ref 5–15)
BUN: 25 mg/dL — ABNORMAL HIGH (ref 6–20)
CO2: 23 mmol/L (ref 22–32)
Calcium: 8.5 mg/dL — ABNORMAL LOW (ref 8.9–10.3)
Chloride: 104 mmol/L (ref 98–111)
Creatinine, Ser: 1.26 mg/dL — ABNORMAL HIGH (ref 0.44–1.00)
GFR, Estimated: 54 mL/min — ABNORMAL LOW (ref >60)
Glucose, Bld: 108 mg/dL — ABNORMAL HIGH (ref 70–99)
Potassium: 3.5 mmol/L (ref 3.5–5.1)
Sodium: 137 mmol/L (ref 135–145)
Total Bilirubin: 0.7 mg/dL (ref 0.0–1.2)
Total Protein: 6.9 g/dL (ref 6.5–8.1)

## 2024-02-07 LAB — CBC
HCT: 25.5 *Deleted — ABNORMAL LOW (ref 36.0–46.0)
Hemoglobin: 7.2 g/dL — ABNORMAL LOW (ref 12.0–15.0)
MCH: 21 pg — ABNORMAL LOW (ref 26.0–34.0)
MCHC: 28.2 g/dL — ABNORMAL LOW (ref 30.0–36.0)
MCV: 74.3 fL — ABNORMAL LOW (ref 80.0–100.0)
Platelets: 267 10*3/uL (ref 150–400)
RBC: 3.43 MIL/uL — ABNORMAL LOW (ref 3.87–5.11)
RDW: 20.1 *Deleted — ABNORMAL HIGH (ref 11.5–15.5)
WBC: 14.7 10*3/uL — ABNORMAL HIGH (ref 4.0–10.5)
nRBC: 0 *Deleted (ref 0.0–0.2)

## 2024-02-07 LAB — PROTIME-INR
INR: 1.3 — ABNORMAL HIGH (ref 0.8–1.2)
Prothrombin Time: 15.9 s — ABNORMAL HIGH (ref 11.4–15.2)

## 2024-02-07 LAB — CORTISOL-AM, BLOOD: Cortisol - AM: 14 ug/dL (ref 6.7–22.6)

## 2024-02-07 MED ORDER — ONDANSETRON HCL 4 MG PO TABS: 4.0000 mg | ORAL_TABLET | Freq: Four times a day (QID) | ORAL | Status: DC | PRN

## 2024-02-07 MED ORDER — ONDANSETRON HCL 4 MG/2ML IJ SOLN: 4.0000 mg | Freq: Four times a day (QID) | INTRAMUSCULAR | Status: DC | PRN

## 2024-02-07 MED ORDER — ROSUVASTATIN CALCIUM 20 MG PO TABS
20.0000 mg | ORAL_TABLET | Freq: Every day | ORAL | Status: DC
  Administered 2024-02-08 – 2024-02-10 (×3): 20 mg via ORAL
  Filled 2024-02-07 (×3): qty 1

## 2024-02-07 MED ORDER — VANCOMYCIN HCL 1750 MG/350ML IV SOLN: 1750.0000 mg | INTRAVENOUS | Status: DC

## 2024-02-07 MED ORDER — METRONIDAZOLE 500 MG/100ML IV SOLN: 500.0000 mg | Freq: Two times a day (BID) | INTRAVENOUS | Status: DC

## 2024-02-07 MED ORDER — ENOXAPARIN SODIUM 40 MG/0.4ML IJ SOSY
40.0000 mg | PREFILLED_SYRINGE | INTRAMUSCULAR | Status: DC
  Administered 2024-02-07 – 2024-02-08 (×2): 40 mg via SUBCUTANEOUS
  Filled 2024-02-07 (×2): qty 0.4

## 2024-02-07 MED ORDER — METRONIDAZOLE 500 MG/100ML IV SOLN
500.0000 mg | Freq: Two times a day (BID) | INTRAVENOUS | Status: DC
  Administered 2024-02-07 – 2024-02-09 (×4): 500 mg via INTRAVENOUS
  Filled 2024-02-07 (×4): qty 100

## 2024-02-07 MED ORDER — MODAFINIL 200 MG PO TABS: 200.0000 mg | ORAL_TABLET | Freq: Every day | ORAL | Status: DC

## 2024-02-07 MED ORDER — LACTATED RINGERS IV SOLN
150.0000 mL/h | INTRAVENOUS | Status: AC
  Administered 2024-02-07: 150 mL/h via INTRAVENOUS

## 2024-02-07 MED ORDER — SODIUM CHLORIDE 0.9 % IV SOLN: 2.0000 g | Freq: Once | INTRAVENOUS | Status: DC

## 2024-02-07 MED ORDER — VANCOMYCIN HCL 2000 MG/400ML IV SOLN
2000.0000 mg | Freq: Once | INTRAVENOUS | Status: AC
Start: 2024-02-07 — End: 2024-02-07
  Administered 2024-02-07: 2000 mg via INTRAVENOUS
  Filled 2024-02-07: qty 400

## 2024-02-07 MED ORDER — VITAMIN D (ERGOCALCIFEROL) 1.25 MG (50000 UNIT) PO CAPS: 50000.0000 [IU] | ORAL_CAPSULE | ORAL | Status: DC

## 2024-02-07 MED ORDER — MORPHINE SULFATE (PF) 4 MG/ML IV SOLN
4.0000 mg | INTRAVENOUS | Status: DC | PRN
  Administered 2024-02-07 – 2024-02-10 (×6): 4 mg via INTRAVENOUS
  Filled 2024-02-07 (×6): qty 1

## 2024-02-07 MED ORDER — ALPRAZOLAM 0.5 MG PO TABS
0.5000 mg | ORAL_TABLET | Freq: Once | ORAL | Status: AC
  Administered 2024-02-07: 0.5 mg via ORAL
  Filled 2024-02-07: qty 1

## 2024-02-07 MED ORDER — VANCOMYCIN HCL IN DEXTROSE 1-5 GM/200ML-% IV SOLN: 1000.0000 mg | Freq: Once | INTRAVENOUS | Status: DC

## 2024-02-07 MED ORDER — LEVOTHYROXINE SODIUM 50 MCG PO TABS
150.0000 ug | ORAL_TABLET | Freq: Every day | ORAL | Status: DC
  Administered 2024-02-09 – 2024-02-10 (×2): 150 ug via ORAL
  Filled 2024-02-07 (×2): qty 1

## 2024-02-07 MED ORDER — SODIUM CHLORIDE 0.9 % IV SOLN: Freq: Once | INTRAVENOUS | Status: AC

## 2024-02-07 MED ORDER — SODIUM CHLORIDE 0.9 % IV SOLN
2.0000 g | Freq: Three times a day (TID) | INTRAVENOUS | Status: DC
  Administered 2024-02-07: 2 g via INTRAVENOUS
  Filled 2024-02-07: qty 12.5

## 2024-02-07 MED ORDER — BUTALBITAL-APAP-CAFFEINE 50-325-40 MG PO TABS
1.0000 | ORAL_TABLET | Freq: Four times a day (QID) | ORAL | Status: DC | PRN
  Administered 2024-02-07 – 2024-02-08 (×2): 1 via ORAL
  Filled 2024-02-07 (×2): qty 1

## 2024-02-07 MED ORDER — MODAFINIL 100 MG PO TABS: 100.0000 mg | ORAL_TABLET | Freq: Every day | ORAL | Status: DC

## 2024-02-07 MED ORDER — LAMOTRIGINE 100 MG PO TABS
200.0000 mg | ORAL_TABLET | Freq: Every day | ORAL | Status: DC
  Administered 2024-02-07 – 2024-02-10 (×4): 200 mg via ORAL
  Filled 2024-02-07 (×4): qty 2

## 2024-02-07 MED ORDER — SERTRALINE HCL 100 MG PO TABS
200.0000 mg | ORAL_TABLET | Freq: Every day | ORAL | Status: DC
  Administered 2024-02-07 – 2024-02-10 (×4): 200 mg via ORAL
  Filled 2024-02-07 (×4): qty 2

## 2024-02-07 MED ORDER — SODIUM CHLORIDE 0.9 % IV SOLN
2.0000 g | INTRAVENOUS | Status: DC
  Administered 2024-02-07 – 2024-02-08 (×2): 2 g via INTRAVENOUS
  Filled 2024-02-07 (×2): qty 20

## 2024-02-07 NOTE — Progress Notes (Signed)
   02/07/24 2259  BiPAP/CPAP/SIPAP  $ Non-Invasive Home Ventilator  Initial  $ Face Mask Small Yes  BiPAP/CPAP/SIPAP Pt Type Adult  BiPAP/CPAP/SIPAP DREAMSTATIOND  Mask Type Full face mask (per home use)  Dentures removed? Not applicable  Mask Size Small  Respiratory Rate 20 breaths/min  Flow Rate 3 lpm (per home regimen)  Patient Home Equipment No  Auto Titrate Yes (automode, min12-20cm h2o per chart notes (pt comfortable))  CPAP/SIPAP surface wiped down Yes  BiPAP/CPAP /SiPAP Vitals  Pulse Rate 78  Resp 20  SpO2 98 %  MEWS Score/Color  MEWS Score 0  MEWS Score Color Green

## 2024-02-07 NOTE — ED Provider Notes (Signed)
 Patient signed out pending CT scan.  CT shows concerning findings for residual or mild diverticulitis.  Also has some bladder wall thickening and some evidence of intraluminal gas which could indicate a colovesicular fistula.  She has received Rocephin, azithromycin, and Flagyl.  Urinalysis also looks dirty.  She is covered for UTI with Rocephin.  Will call pharmacy to see if she needs additional coverage.  Consult with general surgery.  12:13 AM General Surgery to see tomorrow.  Recommends admission to the hospitalist.  Discussed with Dr. Mikeal Hawthorne who will admit the patient.  Lactate has cleared.  She is persistently febrile but otherwise vital signs have stabilized.  Physical Exam  BP (!) 100/37   Pulse 97   Temp (!) 101.4 F (38.6 C) (Oral)   Resp 14   SpO2 94%   Procedures  Procedures  ED Course / MDM   Clinical Course as of 02/07/24 0013  Wed Feb 07, 2024  0005 Spoke with Dr. Magnus Ivan, general surgery.  Recommends medical admission and they will consult in the morning.  Patient is NPO. [CH]    Clinical Course User Index [CH] Deangelo Berns, Mayer Masker, MD   Medical Decision Making Amount and/or Complexity of Data Reviewed Labs: ordered. Radiology: ordered.  Risk OTC drugs. Prescription drug management. Decision regarding hospitalization.   Problem List Items Addressed This Visit   None Visit Diagnoses       Diverticulitis    -  Primary     Colonic diverticular abscess         Colovesical fistula                 Wilkie Aye, Mayer Masker, MD 02/07/24 [phone]

## 2024-02-07 NOTE — Consult Note (Addendum)
 Alexandra Beck [birth year]  [id number].    Requesting MD: Dr. Willeen Niece Chief Complaint/Reason for Consult: Diverticulitis   HPI: Alexandra Beck is a 43 y.o. who presented to the ED w/ fever.   Patient who is known to our service for diverticulitis after an admission in Oct 2024.  Following that admission she followed up with [facility], Dr. Clovis Cao, who did not recommend elective surgery at that time and patient was referred to bariatric surgeon discussed weight loss.  She is currently on Ozempic and following with [facility] in discussions for gastric bypass.   She presented overnight with complaints of fever, chills, right flank pain (ongoing for 1 month), shortness of breath, and dyspnea on exertion.  She did note abdominal pain with palpation by EMS and EDP but had not noticed any abdominal pain prior to this.  She reports she has had pneumaturia and flecks in her stool since November 2024 that are unchanged and denies any additional urinary symptoms. Prior to admission she was tolerating po with n/v. Last BM overnight and normal. No diarrhea, hematochezia and she has noticed melena.   Upon arrival she was noted to be febrile to 104.5, tachycardic with soft BP (97/71). Lactic 4.1. WBC 12.5. Cr 1.34 (baseline 1.16). Hgb 7.8 (baseline 8.7 - recently started on iron infusions). Her CT scan showed pancolonic diverticulosis with diverticulitis of the mid sigmoid colon, residual diverticular fluid collection extending from the sigmoid to the anterior bladder wall with punctate intraluminal gas within the bladder concerning for colovesical fistula.  There is also concerns for possible pyelonephritis. UA with large leukocytes, many bacteria and greater than 50 WBCs.  Urine culture pending.  She underwent resuscitation with resolution of fever, tachycardia and soft blood pressures.  Lactic cleared. She is currently on 2L (does not wear o2 during the day, wears 3L o2 w/ CPAP at bedtime). We were asked to see  for diverticulitis.  Her last colonoscopy was Dec 2024 that showed 3 mm polyp was found in the cecum, 12 mm polyp was found in the ascending colon, Multiple large-mouthed and small-mouthed diverticula were found in the sigmoid colon, transverse colon and ascending colon. There was a localized area of moderately congested and erythematous mucosa was found in the sigmoid colon.   Past Medical History: DM, CHF, HTN, HLD, Hypothyroidism, OSA Prior Abdominal Surgeries: None Blood Thinners: None Tobacco Use: None Alcohol Use: Occ Substance use: None  Employment: Works in [employer] at [employer]  ROS: ROS As above, see hpi  Family History  Problem Relation Age of Onset   Hypertension Mother    Diabetes Mother    High Cholesterol Mother    Thyroid disease Mother    Sleep apnea Mother    Obesity Mother    Hypertension Father    Heart disease Father        before age 46   High Cholesterol Father    Sleep apnea Father    Obesity Father     Past Medical History:  Diagnosis Date   Acute renal failure (ARF) (HCC) 11/2012    multifactorial-likely secondary to ATN in the setting of sepsis and hypotension, and also from rhabdomyolysis   Anemia    Cellulitis    Chronic diastolic CHF (congestive heart failure) (HCC)    Depression    Diabetes mellitus (HCC)    GERD (gastroesophageal reflux disease)    Hyperlipidemia    Hypertension    Hypothyroidism    Morbid obesity with BMI  of 70 and over, adult (HCC)    Obesity hypoventilation syndrome (HCC)    OSA (obstructive sleep apnea)    PVC's (premature ventricular contractions)    Recurrent cellulitis of lower leg    LLE, venous insuff   Sepsis (HCC) 11/2012   Secondary to cellulitis   Sleep apnea    Venous insufficiency of leg     Past Surgical History:  Procedure Laterality Date   IR FLUORO GUIDE CV MIDLINE PICC RIGHT  03/28/2017   IR US GUIDE VASC ACCESS RIGHT  03/28/2017   WISDOM TOOTH EXTRACTION      Social History:  reports that  she quit smoking about 14 years ago. Her smoking use included cigarettes. She started smoking about 22 years ago. She has a 4 pack-year smoking history. She has never used smokeless tobacco. She reports that she does not drink alcohol and does not use drugs.  Allergies:  Allergies  Allergen Reactions   Aspirin Hives, Swelling and Other (See Comments)    REACTION: throat swelling, hives  Other reaction(s): Unknown  REACTION: throat swelling, hives Other reaction(s): Unknown Other reaction(s): Unknown   Doxycycline Other (See Comments)    Abdominal pain, difficulty swallowing   Lisinopril Cough   Niacin     Other reaction(s): Unknown Other reaction(s): Unknown   Niacin And Related     Other reaction(s): Unknown Other reaction(s): Unknown   Niaspan [Niacin Er (Antihyperlipidemic)]     Caused flushing   Wound Dressing Adhesive Itching    Welts and burning of the skin   Wound Dressings Itching    Welts and burning of the skin    (Not in a hospital admission)    Physical Exam: Blood pressure (!) 114/57, pulse 80, temperature 100 F (37.8 C), temperature source Oral, resp. rate 18, SpO2 96%. General: pleasant, WD/WN female who is laying in bed in NAD HEENT: head is normocephalic, atraumatic.   Heart: regular, rate, and rhythm.   Lungs:  Respiratory effort nonlabored Abd: Obese, no obvious distension, central abdominal ttp without rigidity or guarding but limited by body habitus. +BS MS: no BUE edema Skin: warm and dry  Psych: A&Ox4 with an appropriate affect Neuro: normal speech, thought process intact, gait not assessed   Results for orders placed or performed during the hospital encounter of 02/06/24 (from the past 48 hours)  Blood Culture (routine x 2)     Status: None (Preliminary result)   Collection Time: 02/06/24  7:33 PM   Specimen: BLOOD  Result Value Ref Range   Specimen Description      BLOOD Performed at [facility], [address]  [place], [place] [address]    Special Requests      BOTTLES DRAWN AEROBIC AND ANAEROBIC Blood Culture adequate volume Performed at [facility], [address] [place], [place] [address]    Culture      NO GROWTH < 12 HOURS Performed at [facility], [address], [place], [place] [address]    Report Status PENDING   Brain natriuretic peptide     Status: Abnormal   Collection Time: 02/06/24  7:33 PM  Result Value Ref Range   B Natriuretic Peptide 172.8 (H) 0.0 - 100.0 pg/mL    Comment: Performed at [facility], [address] [place], [place] [address]  hCG, serum, qualitative     Status: None   Collection Time: 02/06/24  7:33 PM  Result Value Ref Range   Preg,  Serum NEGATIVE NEGATIVE    Comment:        THE SENSITIVITY OF THIS METHODOLOGY IS >10 mIU/mL. Performed at [facility], [address], [place], [place] [address]   Comprehensive metabolic panel     Status: Abnormal   Collection Time: 02/06/24  7:34 PM  Result Value Ref Range   Sodium 138 135 - 145 mmol/L   Potassium 3.4 (L) 3.5 - 5.1 mmol/L   Chloride 102 98 - 111 mmol/L   CO2 23 22 - 32 mmol/L   Glucose, Bld 144 (H) 70 - 99 mg/dL    Comment: Glucose reference range applies only to samples taken after fasting for at least 8 hours.   BUN 23 (H) 6 - 20 mg/dL   Creatinine, Ser 6.04 (H) 0.44 - 1.00 mg/dL   Calcium 8.9 8.9 - 54.0 mg/dL   Total Protein 7.1 6.5 - 8.1 g/dL   Albumin 3.1 (L) 3.5 - 5.0 g/dL   AST 22 15 - 41 U/L   ALT 13 0 - 44 U/L   Alkaline Phosphatase 83 38 - 126 U/L   Total Bilirubin 1.0 0.0 - 1.2 mg/dL   GFR, Estimated 50 (L) >60 mL/min    Comment: (NOTE) Calculated using the CKD-EPI Creatinine Equation (2021)    Anion gap 13 5 - 15    Comment: Performed at [facility], [address] [place], [place] [address]  CBC with Differential     Status: Abnormal   Collection Time: 02/06/24  7:34 PM  Result Value Ref Range    WBC 12.5 (H) 4.0 - 10.5 K/uL   RBC 3.68 (L) 3.87 - 5.11 MIL/uL   Hemoglobin 7.8 (L) 12.0 - 15.0 g/dL    Comment: Reticulocyte Hemoglobin testing may be clinically indicated, consider ordering this additional test [id number]    HCT 26.7 (L) 36.0 - 46.0 %   MCV 72.6 (L) 80.0 - 100.0 fL   MCH 21.2 (L) 26.0 - 34.0 pg   MCHC 29.2 (L) 30.0 - 36.0 g/dL   RDW 21.3 (H) 08.6 - 57.8 %   Platelets 300 150 - 400 K/uL   nRBC 0.0 0.0 - 0.2 %   Neutrophils Relative % 96 %   Neutro Abs 11.9 (H) 1.7 - 7.7 K/uL   Lymphocytes Relative 2 %   Lymphs Abs 0.3 (L) 0.7 - 4.0 K/uL   Monocytes Relative 1 %   Monocytes Absolute 0.1 0.1 - 1.0 K/uL   Eosinophils Relative 0 %   Eosinophils Absolute 0.0 0.0 - 0.5 K/uL   Basophils Relative 0 %   Basophils Absolute 0.0 0.0 - 0.1 K/uL   WBC Morphology VACUOLATED NEUTROPHILS    Smear Review MORPHOLOGY UNREMARKABLE    Immature Granulocytes 1 %   Abs Immature Granulocytes 0.11 (H) 0.00 - 0.07 K/uL   Polychromasia PRESENT     Comment: Performed at [facility], [address], [place], [place] [address]  Protime-INR     Status: None   Collection Time: 02/06/24  7:34 PM  Result Value Ref Range   Prothrombin Time 15.0 11.4 - 15.2 seconds   INR 1.2 0.8 - 1.2    Comment: (NOTE) INR goal varies based on device and disease states. Performed at [facility], [address] [place], [place] [address]   APTT     Status: None   Collection Time: 02/06/24  7:34 PM  Result Value Ref Range   aPTT 28 24 - 36  seconds    Comment: Performed at [facility], [address] [place], [place] [address]  I-Stat Lactic Acid, ED     Status: Abnormal   Collection Time: 02/06/24  7:50 PM  Result Value Ref Range   Lactic Acid, Venous 4.1 (HH) 0.5 - 1.9 mmol/L   Comment MD NOTIFIED, SUGGEST RECOLLECT   Resp panel by RT-PCR (RSV, Flu A&B, Covid) Anterior Nasal Swab     Status: None   Collection Time: 02/06/24  8:00 PM    Specimen: Anterior Nasal Swab  Result Value Ref Range   SARS Coronavirus 2 by RT PCR NEGATIVE NEGATIVE    Comment: (NOTE) SARS-CoV-2 target nucleic acids are NOT DETECTED.  The SARS-CoV-2 RNA is generally detectable in upper respiratory specimens during the acute phase of infection. The lowest concentration of SARS-CoV-2 viral copies this assay can detect is 138 copies/mL. A negative result does not preclude SARS-Cov-2 infection and should not be used as the sole basis for treatment or other patient management decisions. A negative result may occur with  improper specimen collection/handling, submission of specimen other than nasopharyngeal swab, presence of viral mutation(s) within the areas targeted by this assay, and inadequate number of viral copies(<138 copies/mL). A negative result must be combined with clinical observations, patient history, and epidemiological information. The expected result is Negative.  Fact Sheet for Patients:  BloggerCourse.com  Fact Sheet for Healthcare Providers:  SeriousBroker.it  This test is no t yet approved or cleared by the [place] FDA and  has been authorized for detection and/or diagnosis of SARS-CoV-2 by FDA under an Emergency Use Authorization (EUA). This EUA will remain  in effect (meaning this test can be used) for the duration of the COVID-19 declaration under Section 564(b)(1) of the Act, 21 U.S.C.section 360bbb-3(b)(1), unless the authorization is terminated  or revoked sooner.       Influenza A by PCR NEGATIVE NEGATIVE   Influenza B by PCR NEGATIVE NEGATIVE    Comment: (NOTE) The Xpert Xpress SARS-CoV-2/FLU/RSV plus assay is intended as an aid in the diagnosis of influenza from Nasopharyngeal swab specimens and should not be used as a sole basis for treatment. Nasal washings and aspirates are unacceptable for Xpert Xpress SARS-CoV-2/FLU/RSV testing.  Fact Sheet for  Patients: BloggerCourse.com  Fact Sheet for Healthcare Providers: SeriousBroker.it  This test is not yet approved or cleared by the [place] FDA and has been authorized for detection and/or diagnosis of SARS-CoV-2 by FDA under an Emergency Use Authorization (EUA). This EUA will remain in effect (meaning this test can be used) for the duration of the COVID-19 declaration under Section 564(b)(1) of the Act, 21 U.S.C. section 360bbb-3(b)(1), unless the authorization is terminated or revoked.     Resp Syncytial Virus by PCR NEGATIVE NEGATIVE    Comment: (NOTE) Fact Sheet for Patients: BloggerCourse.com  Fact Sheet for Healthcare Providers: SeriousBroker.it  This test is not yet approved or cleared by the [place] FDA and has been authorized for detection and/or diagnosis of SARS-CoV-2 by FDA under an Emergency Use Authorization (EUA). This EUA will remain in effect (meaning this test can be used) for the duration of the COVID-19 declaration under Section 564(b)(1) of the Act, 21 U.S.C. section 360bbb-3(b)(1), unless the authorization is terminated or revoked.  Performed at [facility], [address] [place], [place] [address]   I-Stat Lactic Acid, ED     Status: None   Collection Time: 02/06/24 11:03 PM  Result Value Ref Range  Lactic Acid, Venous 1.4 0.5 - 1.9 mmol/L  Urinalysis, w/ Reflex to Culture (Infection Suspected) -Urine, Clean Catch     Status: Abnormal   Collection Time: 02/06/24 11:35 PM  Result Value Ref Range   Specimen Source URINE, CLEAN CATCH    Color, Urine AMBER (A) YELLOW    Comment: BIOCHEMICALS MAY BE AFFECTED BY COLOR   APPearance CLOUDY (A) CLEAR   Specific Gravity, Urine 1.025 1.005 - 1.030   pH 5.0 5.0 - 8.0   Glucose, UA NEGATIVE NEGATIVE mg/dL   Hgb urine dipstick LARGE (A) NEGATIVE   Bilirubin Urine NEGATIVE  NEGATIVE   Ketones, ur NEGATIVE NEGATIVE mg/dL   Protein, ur 161 (A) NEGATIVE mg/dL   Nitrite NEGATIVE NEGATIVE   Leukocytes,Ua LARGE (A) NEGATIVE   RBC / HPF >50 0 - 5 RBC/hpf   WBC, UA >50 0 - 5 WBC/hpf    Comment:        Reflex urine culture not performed if WBC <=10, OR if Squamous epithelial cells >5. If Squamous epithelial cells >5 suggest recollection.    Bacteria, UA MANY (A) NONE SEEN   Squamous Epithelial / HPF 0-5 0 - 5 /HPF   WBC Clumps PRESENT    Non Squamous Epithelial 0-5 (A) NONE SEEN    Comment: Performed at [facility], [address], [place], [place] [address]   *Note: Due to a large number of results and/or encounters for the requested time period, some results have not been displayed. A complete set of results can be found in Results Review.   CT ABDOMEN PELVIS W CONTRAST Result Date: 02/06/2024 CLINICAL DATA:  Sepsis, fever, chills EXAM: CT ABDOMEN AND PELVIS WITH CONTRAST TECHNIQUE: Multidetector CT imaging of the abdomen and pelvis was performed using the standard protocol following bolus administration of intravenous contrast. RADIATION DOSE REDUCTION: This exam was performed according to the departmental dose-optimization program which includes automated exposure control, adjustment of the mA and/or kV according to patient size and/or use of iterative reconstruction technique. CONTRAST:  80mL OMNIPAQUE IOHEXOL 300 MG/ML  SOLN COMPARISON:  None Available. FINDINGS: Lower chest: No acute abnormality. Hepatobiliary: Cholelithiasis without superimposed pericholecystic inflammatory change. Mildly progressive, moderate hepatomegaly. Liver otherwise unremarkable; no enhancing intrahepatic mass identified. No intra or extrahepatic biliary ductal dilation. Pancreas: Unremarkable. No pancreatic ductal dilatation or surrounding inflammatory changes. Spleen: Moderate splenomegaly with the spleen measuring 15.9 cm in greatest dimension, slightly enlarged since  prior examination. Adrenals/Urinary Tract: The adrenal glands are unremarkable. The kidneys are normal in size and position. Since the prior examination there has developed mild perinephric inflammatory stranding suggesting an underlying inflammatory process such as pyelonephritis. No hydronephrosis. No intrarenal or ureteral calculi. There is server frontal bladder wall thickening in keeping with an infectious or inflammatory cystitis. Additionally, there has developed punctate intraluminal gas within the bladder lumen non dependently. Inflammatory gas and fluid collection related to perforated diverticulitis is again seen abutting the anterior wall of the bladder, best seen on image # 74/3. Together, a colovesicular fistula is not excluded. Stomach/Bowel: Moderate pancolonic diverticulosis noted. There are inflammatory changes involving the mid sigmoid colon similar prior examination though less severe reflecting recurrent mild or residual diverticulitis. No, however, that an infiltrative mass could appear similarly, particularly given its persistence. A a loculated gas and fluid collection is again seen extending from the inflamed portion: 2 anterior bladder were there is focal bladder wall thickening in keeping with a residual diverticular abscess and possible colovesicular fistula. The stomach, small bowel,  and large bowel are otherwise unremarkable. Appendix normal. No free intraperitoneal gas or fluid. Vascular/Lymphatic: No significant vascular findings are present. No enlarged abdominal or pelvic lymph nodes. Reproductive: A complex multiloculated cystic lesion is seen involving the left adnexa, not well characterized on this examination but better delineated since prior exam with improvement in surrounding inflammatory change. The pelvic organs are otherwise unremarkable. Other: Mild diffuse subcutaneous body wall edema. Musculoskeletal: No acute or significant osseous findings. IMPRESSION: 1. Moderate  pancolonic diverticulosis. Recurrent mild or residual diverticulitis involving the mid sigmoid colon similar prior examination though less severe reflecting recurrent mild or residual diverticulitis. Endoscopy should be considered, however, to exclude underlying infiltrative mass. 2. Residual diverticular abscess extending from the inflamed portion of the sigmoid colon to the anterior bladder wall where there is focal bladder wall thickening in keeping with an infectious or inflammatory cystitis. Additionally, there has developed punctate intraluminal gas within the bladder lumen non dependently. Together, a colovesicular fistula is not excluded. 3. Mild perinephric inflammatory stranding suggesting an underlying inflammatory process such as pyelonephritis. Correlation with urinalysis and urine culture may be helpful. 4. Complex multiloculated cystic lesion involving the left adnexa, not well characterized on this examination but better delineated since prior exam with improvement in surrounding inflammatory change. This could be better assessed with dedicated pelvic sonography once the patient's acute issues have resolved 5. Cholelithiasis. 6. Mildly progressive, moderate hepatosplenomegaly. Electronically Signed   By: Helyn Numbers M.D.   On: 02/06/2024 23:16   DG Chest Port 1 View Result Date: 02/06/2024 CLINICAL DATA:  Cough and fever. EXAM: PORTABLE CHEST 1 VIEW COMPARISON:  None Available. FINDINGS: The heart is upper normal in size. The cardiomediastinal contours are normal. Subsegmental atelectasis in the right lower lung zone. Pulmonary vasculature is normal. No consolidation, pleural effusion, or pneumothorax. No acute osseous abnormalities are seen. IMPRESSION: Subsegmental atelectasis in the right lower lung zone. Electronically Signed   By: Narda Rutherford M.D.   On: 02/06/2024 21:49    Anti-infectives (From admission, onward)    Start     Dose/Rate Route Frequency Ordered Stop   02/07/24  2200  vancomycin (VANCOREADY) IVPB 1750 mg/350 mL        1,750 mg 175 mL/hr over 120 Minutes Intravenous Every 24 hours 02/07/24 0026     02/07/24 0600  ceFEPIme (MAXIPIME) 2 g in sodium chloride 0.9 % 100 mL IVPB        2 g 200 mL/hr over 30 Minutes Intravenous Every 8 hours 02/07/24 0026     02/07/24 0030  vancomycin (VANCOREADY) IVPB 2000 mg/400 mL        2,000 mg 200 mL/hr over 120 Minutes Intravenous  Once 02/07/24 0024 02/07/24 0359   02/06/24 2145  metroNIDAZOLE (FLAGYL) IVPB 500 mg        500 mg 100 mL/hr over 60 Minutes Intravenous  Once 02/06/24 2133 02/06/24 2250   02/06/24 1930  cefTRIAXone (ROCEPHIN) 2 g in sodium chloride 0.9 % 100 mL IVPB        2 g 200 mL/hr over 30 Minutes Intravenous Once 02/06/24 1923 02/06/24 2117   02/06/24 1930  azithromycin (ZITHROMAX) 500 mg in sodium chloride 0.9 % 250 mL IVPB        500 mg 250 mL/hr over 60 Minutes Intravenous  Once 02/06/24 1923 02/06/24 2117       Assessment/Plan Sigmoid Diverticulitis  This is a 43 year old female who is known to our service for diverticulitis after an admission in Oct 2024.  Following that admission she followed up with [facility], Dr. Clovis Cao, who did not recommend elective surgery at that time and patient was referred to bariatric surgeon discussed weight loss.  She is currently on Ozempic and following with [facility] in discussions for gastric bypass.   She presented overnight with complaints of fever, chills, right flank pain, shortness of breath, and dyspnea on exertion.  She did note abdominal pain with palpation by EMS and EDP but had not noticed any abdominal pain prior to this.  She reports she has had pneumaturia and flecks in her stool since November that are unchanged and denies any additional urinary symptoms.  Upon arrival she was noted to be febrile to 104.5, tachycardic with soft BP (97/71). Lactic 4.1. WBC 12.5. Cr 1.34 (baseline 1.16). Hgb 7.8 (baseline 8.7 - recently started on iron infusions). Her  CT scan showed pancolonic diverticulosis with diverticulitis of the mid sigmoid colon, residual diverticular fluid collection extending from the sigmoid to the anterior bladder wall with punctate intraluminal gas within the bladder concerning for colovesical fistula.  There is also concerns for possible pyelonephritis. UA with large leukocytes, many bacteria and greater than 50 WBCs.  Urine culture pending.  She underwent resuscitation with resolution of fever, tachycardia and soft blood pressures.  Lactic cleared.  We were asked to see for diverticulitis.  - Nothing appears drainable by IR at this time.  - No current indication for emergency surgery - Agree with IV antibiotics - Keep NPO - Hopefully patient will improve with conservative treatment.  If patient fails to improve they may require repeating imaging, drain placement, or surgical intervention resulting in a colectomy/colostomy.  Surgical management would be more difficult given her body habitus.  - If patient improves with conservative management, would recommend follow-up with [facility] - We will follow with you. - Remainder of care per primary   FEN - NPO, IVF per primary  VTE - SCDs, okay for chem ppx from our standpoint ID - Currently on Cefepime/Vanc. Would recommend Cefepime/Flagyl or Zosyn.   I reviewed nursing notes, ED provider notes, last 24 h vitals and pain scores, last 48 h intake and output, last 24 h labs and trends, and last 24 h imaging results. Reviewed outside notes   Jacinto Halim, [facility] 02/07/2024, 9:08 AM Please see Amion for pager number during day hours 7:00am-4:30pm

## 2024-02-07 NOTE — Plan of Care (Signed)

## 2024-02-07 NOTE — Progress Notes (Signed)
 PHARMACY - PHYSICIAN COMMUNICATION CRITICAL VALUE ALERT - BLOOD CULTURE IDENTIFICATION (BCID)  Alexandra Beck is an 43 y.o. female who presented to [facility] on 02/06/2024 with a chief complaint of SOB found to have diverticulitis with hx of diverticulitis  Assessment:  Ecoli bacteremia  Name of physician (or Provider) Contacted: Idelle Leech   Current antibiotics: Vanc/cefepime  Changes to prescribed antibiotics recommended:  Change to Rocephin 2g IV q24 and add flagyl for better coverage of diverticulitis  Results for orders placed or performed during the hospital encounter of 02/06/24  Blood Culture ID Panel (Reflexed) (Collected: 02/06/2024  7:33 PM)  Result Value Ref Range   Enterococcus faecalis NOT DETECTED NOT DETECTED   Enterococcus Faecium NOT DETECTED NOT DETECTED   Listeria monocytogenes NOT DETECTED NOT DETECTED   Staphylococcus species NOT DETECTED NOT DETECTED   Staphylococcus aureus (BCID) NOT DETECTED NOT DETECTED   Staphylococcus epidermidis NOT DETECTED NOT DETECTED   Staphylococcus lugdunensis NOT DETECTED NOT DETECTED   Streptococcus species NOT DETECTED NOT DETECTED   Streptococcus agalactiae NOT DETECTED NOT DETECTED   Streptococcus pneumoniae NOT DETECTED NOT DETECTED   Streptococcus pyogenes NOT DETECTED NOT DETECTED   A.calcoaceticus-baumannii NOT DETECTED NOT DETECTED   Bacteroides fragilis NOT DETECTED NOT DETECTED   Enterobacterales DETECTED (A) NOT DETECTED   Enterobacter cloacae complex NOT DETECTED NOT DETECTED   Escherichia coli DETECTED (A) NOT DETECTED   Klebsiella aerogenes NOT DETECTED NOT DETECTED   Klebsiella oxytoca NOT DETECTED NOT DETECTED   Klebsiella pneumoniae NOT DETECTED NOT DETECTED   Proteus species NOT DETECTED NOT DETECTED   Salmonella species NOT DETECTED NOT DETECTED   Serratia marcescens NOT DETECTED NOT DETECTED   Haemophilus influenzae NOT DETECTED NOT DETECTED   Neisseria meningitidis NOT DETECTED NOT DETECTED   Pseudomonas  aeruginosa NOT DETECTED NOT DETECTED   Stenotrophomonas maltophilia NOT DETECTED NOT DETECTED   Candida albicans NOT DETECTED NOT DETECTED   Candida auris NOT DETECTED NOT DETECTED   Candida glabrata NOT DETECTED NOT DETECTED   Candida krusei NOT DETECTED NOT DETECTED   Candida parapsilosis NOT DETECTED NOT DETECTED   Candida tropicalis NOT DETECTED NOT DETECTED   Cryptococcus neoformans/gattii NOT DETECTED NOT DETECTED   CTX-M ESBL NOT DETECTED NOT DETECTED   Carbapenem resistance IMP NOT DETECTED NOT DETECTED   Carbapenem resistance KPC NOT DETECTED NOT DETECTED   Carbapenem resistance NDM NOT DETECTED NOT DETECTED   Carbapenem resist OXA 48 LIKE NOT DETECTED NOT DETECTED   Carbapenem resistance VIM NOT DETECTED NOT DETECTED    Berkley Harvey 02/07/2024  11:05 AM

## 2024-02-07 NOTE — Care Plan (Signed)
 This 43 yrs old female with PMH  significant of non-insulin-dependent diabetes, chronic diastolic heart failure, obesity , hypoventilation syndrome, obstructive sleep apnea, depression, GERD, essential hypertension, hypothyroidism, hyperlipidemia, anemia of chronic disease, recurrent diverticulitis who presented to the ER with complains of fever and chills and lower abdominal pain.  Patient also was hypoxic requiring supplemental oxygen.  Patient met sepsis criteria on arrival in the ED, lactic acid 4.0.  Started on IV fluids and antibiotics.  Imaging shows patient has residual diverticulitis with diverticular abscess and findings are worrisome for colovesical fistula.  Patient was admitted and general surgery was consulted.  General surgery recommended conservative management, IV hydration, IV pain medicines, IV antibiotics.  If patient does not improve may consider surgery at that point.  Patient feels improved.

## 2024-02-07 NOTE — H&P (Addendum)
 History and Physical    Patient: Alexandra Beck:[id number] DOB: 01/19/1981 DOA: 02/06/2024 DOS: the patient was seen and examined on 02/07/2024 PCP: Corwin Levins, MD  Patient coming from: Home  Chief Complaint:  Chief Complaint  Patient presents with   Shortness of Breath   HPI: Alexandra Beck is a 43 y.o. female with medical history significant of non-insulin-dependent diabetes, chronic diastolic heart failure, obesity hypoventilation syndrome, obstructive sleep apnea, depression, GERD, essential hypertension, hypothyroidism, hyperlipidemia, anemia of chronic disease, recurrent diverticulitis who presented to the ER from home complaining of fever and chills that started this afternoon.  Patient was also hypoxic with oxygen sat 88% on room air.  She was placed on 3 L and brought to the ER.  She complained of generalized weakness.  Patient was also having some abdominal pain.  Patient has had Tylenol at home but still fever has persisted.  Denied any sore throat no cough no respiratory symptoms.  In the ER patient was evaluated and found to have met sepsis criteria.  She also has severe sepsis due to hypotension, initial hypoxemia as well as lactate of 4.  Further testing shows that patient has residual diverticulitis with diverticular abscess and findings worrisome for colovesical fistula which patient knows about since November last year.  She is following up with a general surgeon at [facility] with surgery postponed due to her weight and body habitus..  At this point she has been admitted with severe sepsis which could be multifactorial from diverticular abscess, UTI or even early pneumonia.  Review of Systems: As mentioned in the history of present illness. All other systems reviewed and are negative. Past Medical History:  Diagnosis Date   Acute renal failure (ARF) (HCC) 11/2012    multifactorial-likely secondary to ATN in the setting of sepsis and hypotension, and also from  rhabdomyolysis   Anemia    Cellulitis    Chronic diastolic CHF (congestive heart failure) (HCC)    Depression    Diabetes mellitus (HCC)    GERD (gastroesophageal reflux disease)    Hyperlipidemia    Hypertension    Hypothyroidism    Morbid obesity with BMI of 70 and over, adult (HCC)    Obesity hypoventilation syndrome (HCC)    OSA (obstructive sleep apnea)    PVC's (premature ventricular contractions)    Recurrent cellulitis of lower leg    LLE, venous insuff   Sepsis (HCC) 11/2012   Secondary to cellulitis   Sleep apnea    Venous insufficiency of leg    Past Surgical History:  Procedure Laterality Date   IR FLUORO GUIDE CV MIDLINE PICC RIGHT  03/28/2017   IR US GUIDE VASC ACCESS RIGHT  03/28/2017   WISDOM TOOTH EXTRACTION     Social History:  reports that she quit smoking about 14 years ago. Her smoking use included cigarettes. She started smoking about 22 years ago. She has a 4 pack-year smoking history. She has never used smokeless tobacco. She reports that she does not drink alcohol and does not use drugs.  Allergies  Allergen Reactions   Aspirin Hives, Swelling and Other (See Comments)    REACTION: throat swelling, hives  Other reaction(s): Unknown  REACTION: throat swelling, hives Other reaction(s): Unknown Other reaction(s): Unknown   Doxycycline Other (See Comments)    Abdominal pain, difficulty swallowing   Lisinopril Cough   Niacin     Other reaction(s): Unknown Other reaction(s): Unknown   Niacin And Related  Other reaction(s): Unknown Other reaction(s): Unknown   Niaspan [Niacin Er (Antihyperlipidemic)]     Caused flushing   Wound Dressing Adhesive Itching    Welts and burning of the skin   Wound Dressings Itching    Welts and burning of the skin    Family History  Problem Relation Age of Onset   Hypertension Mother    Diabetes Mother    High Cholesterol Mother    Thyroid disease Mother    Sleep apnea Mother    Obesity Mother    Hypertension  Father    Heart disease Father        before age 32   High Cholesterol Father    Sleep apnea Father    Obesity Father     Prior to Admission medications   Medication Sig Start Date End Date Taking? Authorizing Provider  cefadroxil (DURICEF) 500 MG capsule Take 2 capsules (1,000 mg total) by mouth 2 (two) times daily. 12/28/23   Veryl Speak, FNP  lamoTRIgine (LAMICTAL) 200 MG tablet Take 1 tablet (200 mg total) by mouth daily. 11/24/23   Mozingo, Thereasa Solo, NP  levothyroxine (SYNTHROID) 150 MCG tablet Take 1 tablet (150 mcg total) by mouth daily before breakfast. Must keep scheduled appt for future refills 06/30/23   Corwin Levins, MD  losartan (COZAAR) 100 MG tablet Take 1 tablet (100 mg total) by mouth daily. 11/06/23   Dunn, Tacey Ruiz, PA-C  metFORMIN (GLUCOPHAGE) 500 MG tablet Take 1 tablet (500 mg total) by mouth daily. Must keep scheduled appt for future refills 06/30/23   Corwin Levins, MD  metoprolol tartrate (LOPRESSOR) 50 MG tablet Take 50 mg by mouth 2 (two) times daily. Metoprolol succinate 100mg     [provider]  modafinil (PROVIGIL) 100 MG tablet Take 1 tablet (100 mg total) by mouth daily. Take daily at 1230 with a 200mg  tablet. 01/20/24   Oretha Milch, MD  modafinil (PROVIGIL) 200 MG tablet Take 1 tablet (200 mg total) by mouth daily. Take 1 tablet by mouth daily at 1230 with a 100mg  tablet 01/20/24   Oretha Milch, MD  rosuvastatin (CRESTOR) 20 MG tablet Take 1 tablet (20 mg total) by mouth daily. 06/30/23   Corwin Levins, MD  Semaglutide, 2 MG/DOSE, 8 MG/3ML SOPN Inject 2 mg into the skin as directed once a week. 12/25/23   Langston Reusing, MD  sertraline (ZOLOFT) 100 MG tablet Take 2 tablets (200 mg total) by mouth daily. 11/24/23   Mozingo, Thereasa Solo, NP  spironolactone (ALDACTONE) 25 MG tablet Take 1 tablet (25 mg total) by mouth daily. 06/30/23   Corwin Levins, MD  torsemide (DEMADEX) 20 MG tablet Take 4 tablets (80 mg total) by mouth 2 (two) times  daily as needed. 10/23/23   Dunn, Tacey Ruiz, PA-C  triamcinolone cream (KENALOG) 0.1 % Apply 1 Application topically 2 (two) times daily. Patient taking differently: Apply 1 Application topically daily as needed (Apply to hands). 04/12/23 04/11/24  Corwin Levins, MD  Vitamin D, Ergocalciferol, (DRISDOL) 1.25 MG (50000 UNIT) CAPS capsule Take 1 capsule (50,000 Units total) by mouth every 7 (seven) days. 02/05/24   Langston Reusing, MD    Physical Exam: Vitals:   02/06/24 2100 02/06/24 2220 02/06/24 2238 02/07/24 0000  BP: 126/61 (!) 91/43 (!) 108/44 (!) 100/37  Pulse: (!) 102 99 99 97  Resp: (!) 25 (!) 24 15 14   Temp:      TempSrc:  SpO2: 93% 96% 96% 94%   Constitutional: Acutely ill looking, morbidly obese, in mild distress  Eyes: PERRL, lids and conjunctivae normal ENMT: Mucous membranes are dry posterior pharynx clear of any exudate or lesions.Normal dentition.  Neck: normal, supple, no masses, no thyromegaly Respiratory: clear to auscultation bilaterally, no wheezing, no crackles. Normal respiratory effort. No accessory muscle use.  Cardiovascular: Sinus tachycardia, no murmurs / rubs / gallops. No extremity edema. 2+ pedal pulses. No carotid bruits.  Abdomen: Mildly distended, diffuse tenderness more in the left lower quadrant no masses palpated. No hepatosplenomegaly. Bowel sounds positive.  Musculoskeletal: Good range of motion, no joint swelling or tenderness, Skin: no rashes, lesions, ulcers. No induration Neurologic: CN 2-12 grossly intact. Sensation intact, DTR normal. Strength 5/5 in all 4.  Psychiatric: Normal judgment and insight. Alert and oriented x 3.  Anxious mood  Data Reviewed:  Temperature 104.5 blood pressure 91/43, pulse 124, oxygen sats 95% on 2 L, white count 12.5 hemoglobin 7.8 potassium 3.4, creatinine 1.34 with BUN 23.  BNP was 72 lactic acid initially 4.1 acute viral screen is negative.  Urinalysis showed cloudy urine with large leukocytes many bacteria  WBC more than 50.  Chest x-ray showed subsegmental atelectasis in the right lower lung zone.  CT abdomen pelvis without contrast shows moderate pancolonic diverticulosis recurrent mild or residual diverticulitis in the mid sigmoid colon similar to prior exam there is abscess diverticula extending from the inflow portion of the sigmoid colon to the anterior bladder wall with a focal wall thickening indicating inflammatory site cystitis.  Also colovesical fistula not excluded also possibility of pyelonephritis complex multiloculated cystic lesion involving the left adnexa was also noted.  Assessment and Plan:  #1 severe sepsis: Most likely multifactorial.  This includes intra-abdominal abscess, complex diverticulitis, UTI, possible pneumonia.  Patient will be admitted.  Blood cultures obtained.  Aggressive IV antibiotics with bank cefepime and Flagyl to cover all possible organisms.  Surgery already consulted.  Continue current treatment.  Patient will be n.p.o.  #2 diverticular abscess: Appears residual.  Defer to surgery.  Does not appear to be amenable to percutaneous drainage.  #3 Colovesical fistula: Again surgery consulted.  Patient is already following up with a colorectal surgeon at [facility].  Surgery was deferred secondary to patient's body habitus.  She is working on weight loss prior to scheduling surgery.  At this point antibiotics for now and follow surgical recommendations  #4 acute pyelonephritis with cystitis: Continue antibiotics as above.  Follow urine cultures.  #5 obstructive sleep apnea: Will offer CPAP  #6 obesity hypoventilation syndrome: Patient has had mild hypoxia earlier.  This could be related to this.  Continue outpatient treatment.  #7 morbid obesity: Dietary counseling.  #8 chronic diastolic heart failure: Appears compensated at the moment.  Would be cautious with sepsis fluid management  #9 type 2 diabetes: Non-insulin-dependent.  Recent hemoglobin A1c 6.1 showing  good control.  Sliding scale insulin will be initiated  #10 essential hypertension: Patient's blood pressure is low with sepsis.  Will hold antihypertensives temporarily.  #11 GERD: Consider PPIs.  Probably IV while NPO  #12 hypokalemia: Will replete potassium.  #13 hypothyroidism: Resume levothyroxine when oral intake  returns.    Advance Care Planning:   Code Status: Full Code   Consults: Dr. Magnus Ivan, general surgery  Family Communication: No family at bedside  Severity of Illness: The appropriate patient status for this patient is INPATIENT. Inpatient status is judged to be reasonable and necessary in order to provide  the required intensity of service to ensure the patient's safety. The patient's presenting symptoms, physical exam findings, and initial radiographic and laboratory data in the context of their chronic comorbidities is felt to place them at high risk for further clinical deterioration. Furthermore, it is not anticipated that the patient will be medically stable for discharge from the hospital within 2 midnights of admission.   * I certify that at the point of admission it is my clinical judgment that the patient will require inpatient hospital care spanning beyond 2 midnights from the point of admission due to high intensity of service, high risk for further deterioration and high frequency of surveillance required.*  AuthorLonia Blood, MD 02/07/2024 12:18 AM  For on call review [email or web address].

## 2024-02-07 NOTE — Progress Notes (Signed)
 Pharmacy Antibiotic Note  Alexandra Beck is a 43 y.o. female admitted on 02/06/2024 with  diverticular abscess .  Pharmacy has been consulted for Cefepime + Vancomycin dosing.  Plan: Cefepime 2gm IV q8h Vancomycin 2gm IV x1 then 1750mg  IV q24h to target AUC 400-550.  Estimated AUC on this regimen = 494. Monitor renal function and cx data       Temp (24hrs), Avg:103 F (39.4 C), Min:101.4 F (38.6 C), Max:104.5 F (40.3 C)  Recent Labs  Lab 02/06/24 1934 02/06/24 1950 02/06/24 2303  WBC 12.5*  --   --   CREATININE 1.34*  --   --   LATICACIDVEN  --  4.1* 1.4    Estimated Creatinine Clearance: 85.2 mL/min (A) (by C-G formula based on SCr of 1.34 mg/dL (H)).    Allergies  Allergen Reactions   Aspirin Hives, Swelling and Other (See Comments)    REACTION: throat swelling, hives  Other reaction(s): Unknown  REACTION: throat swelling, hives Other reaction(s): Unknown Other reaction(s): Unknown   Doxycycline Other (See Comments)    Abdominal pain, difficulty swallowing   Lisinopril Cough   Niacin     Other reaction(s): Unknown Other reaction(s): Unknown   Niacin And Related     Other reaction(s): Unknown Other reaction(s): Unknown   Niaspan [Niacin Er (Antihyperlipidemic)]     Caused flushing   Wound Dressing Adhesive Itching    Welts and burning of the skin   Wound Dressings Itching    Welts and burning of the skin    Antimicrobials this admission: 3/11 CTX, Zithromax, Flagyl x1 in ED 3/12 Cefepime >>  3/12 Vancomycin >>   Dose adjustments this admission:  Microbiology results: 3/11 BCx:  3/11 UCx:   3/11 Resp PCR: negative  Thank you for allowing pharmacy to be a part of this patient's care.  Junita Push PharmD 02/07/2024 12:19 AM

## 2024-02-07 NOTE — Progress Notes (Signed)
 Pt developed new onset cough after initiation of IVF. Hx of CHF. Complained of intolerance. IVF stopped. Hospitalist notified.

## 2024-02-08 DIAGNOSIS — A419 Sepsis, unspecified organism: Secondary | ICD-10-CM | POA: Diagnosis not present

## 2024-02-08 DIAGNOSIS — R6520 Severe sepsis without septic shock: Secondary | ICD-10-CM | POA: Diagnosis not present

## 2024-02-08 LAB — HEMOGLOBIN AND HEMATOCRIT, BLOOD
HCT: 24.4 % — ABNORMAL LOW (ref 36.0–46.0)
Hemoglobin: 6.8 g/dL — CL (ref 12.0–15.0)

## 2024-02-08 LAB — CBC
HCT: 23 % — ABNORMAL LOW (ref 36.0–46.0)
Hemoglobin: 6.5 g/dL — CL (ref 12.0–15.0)
MCH: 21 pg — ABNORMAL LOW (ref 26.0–34.0)
MCHC: 28.3 g/dL — ABNORMAL LOW (ref 30.0–36.0)
MCV: 74.4 fL — ABNORMAL LOW (ref 80.0–100.0)
Platelets: 243 10*3/uL (ref 150–400)
RBC: 3.09 MIL/uL — ABNORMAL LOW (ref 3.87–5.11)
RDW: 20.3 % — ABNORMAL HIGH (ref 11.5–15.5)
WBC: 12.6 10*3/uL — ABNORMAL HIGH (ref 4.0–10.5)
nRBC: 0 % (ref 0.0–0.2)

## 2024-02-08 LAB — PREPARE RBC (CROSSMATCH)

## 2024-02-08 LAB — COMPREHENSIVE METABOLIC PANEL
ALT: 10 U/L (ref 0–44)
AST: 11 U/L — ABNORMAL LOW (ref 15–41)
Albumin: 2.8 g/dL — ABNORMAL LOW (ref 3.5–5.0)
Alkaline Phosphatase: 70 U/L (ref 38–126)
Anion gap: 8 (ref 5–15)
BUN: 22 mg/dL — ABNORMAL HIGH (ref 6–20)
CO2: 23 mmol/L (ref 22–32)
Calcium: 8.3 mg/dL — ABNORMAL LOW (ref 8.9–10.3)
Chloride: 108 mmol/L (ref 98–111)
Creatinine, Ser: 1.32 mg/dL — ABNORMAL HIGH (ref 0.44–1.00)
GFR, Estimated: 51 mL/min — ABNORMAL LOW (ref >60)
Glucose, Bld: 96 mg/dL (ref 70–99)
Potassium: 3.5 mmol/L (ref 3.5–5.1)
Sodium: 139 mmol/L (ref 135–145)
Total Bilirubin: 0.7 mg/dL (ref 0.0–1.2)
Total Protein: 6.1 g/dL — ABNORMAL LOW (ref 6.5–8.1)

## 2024-02-08 LAB — MAGNESIUM: Magnesium: 2.1 mg/dL (ref 1.7–2.4)

## 2024-02-08 LAB — ABO/RH: ABO/RH(D): O NEG

## 2024-02-08 LAB — PHOSPHORUS: Phosphorus: 3.4 mg/dL (ref 2.5–4.6)

## 2024-02-08 MED ORDER — BUTALBITAL-APAP-CAFFEINE 50-325-40 MG PO TABS
2.0000 | ORAL_TABLET | Freq: Three times a day (TID) | ORAL | Status: DC | PRN
  Administered 2024-02-08: 2 via ORAL
  Filled 2024-02-08: qty 2

## 2024-02-08 MED ORDER — PROCHLORPERAZINE EDISYLATE 10 MG/2ML IJ SOLN
10.0000 mg | Freq: Once | INTRAMUSCULAR | Status: AC
  Administered 2024-02-08: 10 mg via INTRAVENOUS
  Filled 2024-02-08: qty 2

## 2024-02-08 MED ORDER — DIPHENHYDRAMINE HCL 50 MG/ML IJ SOLN
12.5000 mg | Freq: Once | INTRAMUSCULAR | Status: AC
  Administered 2024-02-08: 12.5 mg via INTRAVENOUS
  Filled 2024-02-08: qty 1

## 2024-02-08 MED ORDER — SODIUM CHLORIDE 0.9% IV SOLUTION: Freq: Once | INTRAVENOUS | Status: AC

## 2024-02-08 MED ORDER — KETOROLAC TROMETHAMINE 15 MG/ML IJ SOLN: 15.0000 mg | Freq: Once | INTRAMUSCULAR | Status: DC

## 2024-02-08 NOTE — Progress Notes (Signed)
 Subjective: Has no abdominal pain.  Didn't have abdominal pain upon presentation just fever.  T max 100.  Hungry and would like to eat.  Still with CV symptoms.  ROS: See above, otherwise other systems negative  Objective: Vital signs in last 24 hours: Temp:  [98.2 F (36.8 C)-99.3 F (37.4 C)] 98.7 F (37.1 C) (03/13 0556) Pulse Rate:  [76-90] 90 (03/13 0556) Resp:  [13-23] 15 (03/13 0556) BP: (100-130)/(58-82) 130/77 (03/13 0556) SpO2:  [92 %-100 %] 92 % (03/13 0556)    Intake/Output from previous day: 03/12 0701 - 03/13 0700 In: 1072.1 [I.V.:770.5; IV Piggyback:301.6] Out: -  Intake/Output this shift: No intake/output data recorded.  PE: Gen: NAD Abd: soft, NT, morbidly obese.  Lab Results:  Recent Labs    02/07/24 1645 02/08/24 0343 02/08/24 0756  WBC 14.7* 12.6*  --   HGB 7.2* 6.5* 6.8*  HCT 25.5* 23.0* 24.4*  PLT 267 243  --    BMET Recent Labs    02/07/24 1645 02/08/24 0343  NA 137 139  K 3.5 3.5  CL 104 108  CO2 23 23  GLUCOSE 108* 96  BUN 25* 22*  CREATININE 1.26* 1.32*  CALCIUM 8.5* 8.3*   PT/INR Recent Labs    02/06/24 1934 02/07/24 1645  LABPROT 15.0 15.9*  INR 1.2 1.3*   CMP     Component Value Date/Time   NA 139 02/08/2024 0343   NA 142 11/13/2023 1325   K 3.5 02/08/2024 0343   CL 108 02/08/2024 0343   CO2 23 02/08/2024 0343   GLUCOSE 96 02/08/2024 0343   BUN 22 (H) 02/08/2024 0343   BUN 20 11/13/2023 1325   CREATININE 1.32 (H) 02/08/2024 0343   CREATININE 1.06 11/14/2016 1050   CALCIUM 8.3 (L) 02/08/2024 0343   CALCIUM 9.6 06/26/2007 0000   PROT 6.1 (L) 02/08/2024 0343   PROT 6.6 11/13/2023 1325   ALBUMIN 2.8 (L) 02/08/2024 0343   ALBUMIN 3.7 (L) 11/13/2023 1325   AST 11 (L) 02/08/2024 0343   ALT 10 02/08/2024 0343   ALKPHOS 70 02/08/2024 0343   BILITOT 0.7 02/08/2024 0343   BILITOT <0.2 11/13/2023 1325   GFRNONAA 51 (L) 02/08/2024 0343   GFRAA 81 11/11/2020 1211   Lipase     Component Value Date/Time    LIPASE 48.0 09/23/2022 1134       Studies/Results: CT ABDOMEN PELVIS W CONTRAST Result Date: 02/06/2024 CLINICAL DATA:  Sepsis, fever, chills EXAM: CT ABDOMEN AND PELVIS WITH CONTRAST TECHNIQUE: Multidetector CT imaging of the abdomen and pelvis was performed using the standard protocol following bolus administration of intravenous contrast. RADIATION DOSE REDUCTION: This exam was performed according to the departmental dose-optimization program which includes automated exposure control, adjustment of the mA and/or kV according to patient size and/or use of iterative reconstruction technique. CONTRAST:  80mL OMNIPAQUE IOHEXOL 300 MG/ML  SOLN COMPARISON:  None Available. FINDINGS: Lower chest: No acute abnormality. Hepatobiliary: Cholelithiasis without superimposed pericholecystic inflammatory change. Mildly progressive, moderate hepatomegaly. Liver otherwise unremarkable; no enhancing intrahepatic mass identified. No intra or extrahepatic biliary ductal dilation. Pancreas: Unremarkable. No pancreatic ductal dilatation or surrounding inflammatory changes. Spleen: Moderate splenomegaly with the spleen measuring 15.9 cm in greatest dimension, slightly enlarged since prior examination. Adrenals/Urinary Tract: The adrenal glands are unremarkable. The kidneys are normal in size and position. Since the prior examination there has developed mild perinephric inflammatory stranding suggesting an underlying inflammatory process such as pyelonephritis. No hydronephrosis. No intrarenal or ureteral  calculi. There is server frontal bladder wall thickening in keeping with an infectious or inflammatory cystitis. Additionally, there has developed punctate intraluminal gas within the bladder lumen non dependently. Inflammatory gas and fluid collection related to perforated diverticulitis is again seen abutting the anterior wall of the bladder, best seen on image # 74/3. Together, a colovesicular fistula is not excluded.  Stomach/Bowel: Moderate pancolonic diverticulosis noted. There are inflammatory changes involving the mid sigmoid colon similar prior examination though less severe reflecting recurrent mild or residual diverticulitis. No, however, that an infiltrative mass could appear similarly, particularly given its persistence. A a loculated gas and fluid collection is again seen extending from the inflamed portion: 2 anterior bladder were there is focal bladder wall thickening in keeping with a residual diverticular abscess and possible colovesicular fistula. The stomach, small bowel, and large bowel are otherwise unremarkable. Appendix normal. No free intraperitoneal gas or fluid. Vascular/Lymphatic: No significant vascular findings are present. No enlarged abdominal or pelvic lymph nodes. Reproductive: A complex multiloculated cystic lesion is seen involving the left adnexa, not well characterized on this examination but better delineated since prior exam with improvement in surrounding inflammatory change. The pelvic organs are otherwise unremarkable. Other: Mild diffuse subcutaneous body wall edema. Musculoskeletal: No acute or significant osseous findings. IMPRESSION: 1. Moderate pancolonic diverticulosis. Recurrent mild or residual diverticulitis involving the mid sigmoid colon similar prior examination though less severe reflecting recurrent mild or residual diverticulitis. Endoscopy should be considered, however, to exclude underlying infiltrative mass. 2. Residual diverticular abscess extending from the inflamed portion of the sigmoid colon to the anterior bladder wall where there is focal bladder wall thickening in keeping with an infectious or inflammatory cystitis. Additionally, there has developed punctate intraluminal gas within the bladder lumen non dependently. Together, a colovesicular fistula is not excluded. 3. Mild perinephric inflammatory stranding suggesting an underlying inflammatory process such as  pyelonephritis. Correlation with urinalysis and urine culture may be helpful. 4. Complex multiloculated cystic lesion involving the left adnexa, not well characterized on this examination but better delineated since prior exam with improvement in surrounding inflammatory change. This could be better assessed with dedicated pelvic sonography once the patient's acute issues have resolved 5. Cholelithiasis. 6. Mildly progressive, moderate hepatosplenomegaly. Electronically Signed   By: Helyn Numbers M.D.   On: 02/06/2024 23:16   DG Chest Port 1 View Result Date: 02/06/2024 CLINICAL DATA:  Cough and fever. EXAM: PORTABLE CHEST 1 VIEW COMPARISON:  None Available. FINDINGS: The heart is upper normal in size. The cardiomediastinal contours are normal. Subsegmental atelectasis in the right lower lung zone. Pulmonary vasculature is normal. No consolidation, pleural effusion, or pneumothorax. No acute osseous abnormalities are seen. IMPRESSION: Subsegmental atelectasis in the right lower lung zone. Electronically Signed   By: Narda Rutherford M.D.   On: 02/06/2024 21:49    Anti-infectives: Anti-infectives (From admission, onward)    Start     Dose/Rate Route Frequency Ordered Stop   02/07/24 2200  vancomycin (VANCOREADY) IVPB 1750 mg/350 mL  Status:  Discontinued        1,750 mg 175 mL/hr over 120 Minutes Intravenous Every 24 hours 02/07/24 0026 02/07/24 1116   02/07/24 2000  cefTRIAXone (ROCEPHIN) 2 g in sodium chloride 0.9 % 100 mL IVPB        2 g 200 mL/hr over 30 Minutes Intravenous Every 24 hours 02/07/24 1116     02/07/24 1730  ceFEPIme (MAXIPIME) 2 g in sodium chloride 0.9 % 100 mL IVPB  Status:  Discontinued  2 g 200 mL/hr over 30 Minutes Intravenous  Once 02/07/24 1634 02/07/24 1640   02/07/24 1730  metroNIDAZOLE (FLAGYL) IVPB 500 mg  Status:  Discontinued        500 mg 100 mL/hr over 60 Minutes Intravenous Every 12 hours 02/07/24 1634 02/07/24 1640   02/07/24 1730  vancomycin (VANCOCIN)  IVPB 1000 mg/200 mL premix  Status:  Discontinued        1,000 mg 200 mL/hr over 60 Minutes Intravenous  Once 02/07/24 1634 02/07/24 1640   02/07/24 1200  metroNIDAZOLE (FLAGYL) IVPB 500 mg        500 mg 100 mL/hr over 60 Minutes Intravenous Every 12 hours 02/07/24 1116     02/07/24 0600  ceFEPIme (MAXIPIME) 2 g in sodium chloride 0.9 % 100 mL IVPB  Status:  Discontinued        2 g 200 mL/hr over 30 Minutes Intravenous Every 8 hours 02/07/24 0026 02/07/24 1116   02/07/24 0030  vancomycin (VANCOREADY) IVPB 2000 mg/400 mL        2,000 mg 200 mL/hr over 120 Minutes Intravenous  Once 02/07/24 0024 02/07/24 0359   02/06/24 2145  metroNIDAZOLE (FLAGYL) IVPB 500 mg        500 mg 100 mL/hr over 60 Minutes Intravenous  Once 02/06/24 2133 02/06/24 2250   02/06/24 1930  cefTRIAXone (ROCEPHIN) 2 g in sodium chloride 0.9 % 100 mL IVPB        2 g 200 mL/hr over 30 Minutes Intravenous Once 02/06/24 1923 02/06/24 2117   02/06/24 1930  azithromycin (ZITHROMAX) 500 mg in sodium chloride 0.9 % 250 mL IVPB        500 mg 250 mL/hr over 60 Minutes Intravenous  Once 02/06/24 1923 02/06/24 2117        Assessment/Plan Sigmoid diverticulitis with CV fistula, 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  Will adv to FLD -cont abx therapy for mild diverticulitis on scan, but also for UTI/pyelonephritis.  Urine cx in process -patient would like to continue avoiding surgery and get back to [facility] at outpatient. -tx for anemia per primary team   FEN - FLD, IVFs per primary  VTE - Lovenox ID - Rocephin/Flagyl  Anemia - tx per primary Morbid obesity - planning bypass at [facility] DM HTN Hypothyroidism  I reviewed nursing notes, hospitalist notes, last 24 h vitals and pain scores, last 48 h intake and output, last 24 h labs and trends, and last 24 h imaging results.   LOS: 1 day    Letha Cape , [facility] 02/08/2024, 9:06 AM Please see Amion for pager number  during day hours 7:00am-4:30pm or 7:00am -11:30am on weekends

## 2024-02-08 NOTE — Progress Notes (Signed)
 Transition of Care [facility]) - Inpatient Brief Assessment  Patient Details  Name: Alexandra Beck MRN: [id number] Date of Birth: August 15, 1981  Transition of Care [facility]) CM/SW Contact:    Ewing Schlein, LCSW Phone Number: 02/08/2024, 9:35 AM  Clinical Narrative: Per chart review, no TOC needs identified at this time.  Transition of Care Asessment: Insurance and Status: Insurance coverage has been reviewed Patient has primary care physician: Yes Home environment has been reviewed: Resides in single family home with spouse Prior level of function:: Independent with ADLs at baseline Prior/Current Home Services: No current home services Social Drivers of Health Review: SDOH reviewed no interventions necessary Readmission risk has been reviewed: Yes Transition of care needs: no transition of care needs at this time    02/08/24 0934  Readmission Prevention Plan - High Risk  Transportation Screening Complete  Home Care Consult (High Risk) Complete  High Risk Social Work Consult for recovery care planning/counseling (includes patient and caregiver) Complete  High Risk Palliative Care Screening Not Applicable  Medication Review Complete

## 2024-02-08 NOTE — Progress Notes (Signed)
 PROGRESS NOTE    Alexandra Beck  ZOX:[id number] DOB: 1981/03/21 DOA: 02/06/2024 PCP: Corwin Levins, MD   Brief Narrative: This 43 yrs old female with PMH significant of non-insulin-dependent diabetes, chronic diastolic heart failure, obesity , hypoventilation syndrome, obstructive sleep apnea, depression, GERD, essential hypertension, hypothyroidism, hyperlipidemia, anemia of chronic disease, recurrent diverticulitis who presented to the ED with complains of fever, chills and lower abdominal pain. Patient also was hypoxic requiring supplemental oxygen. Patient met sepsis criteria on arrival in the ED, lactic acid 4.0.  Started on IV fluids and antibiotics.  Imaging shows patient has residual diverticulitis with diverticular abscess and findings are worrisome for colovesical fistula.  Patient was admitted and general surgery was consulted.  General surgery recommended conservative management, IV hydration, IV pain medicines, IV antibiotics.  If patient does not improve may consider surgery at that point.  Patient feels improved. Plan : follow up [facility] once discharge    Assessment & Plan:   Principal Problem:   Severe sepsis (HCC) Active Problems:   Diverticulitis of large intestine with abscess   Chronic diastolic CHF (congestive heart failure) (HCC)   Type 2 diabetes mellitus with obesity (HCC)   AKI (acute kidney injury) (HCC)   OSA (obstructive sleep apnea)   Obesity hypoventilation syndrome (HCC)   Hypokalemia   Hypertension associated with diabetes (HCC)   Hypothyroidism   GERD (gastroesophageal reflux disease)   Depression   Morbid obesity (HCC)   UTI (urinary tract infection)  Severe sepsis :  Likely multifactorial,  this includes intra-abdominal abscess, complex diverticulitis, UTI, possible pneumonia.   Continue Empirical IV antibiotics (cefepime and Flagyl to cover all possible organisms).   General surgical consulted.  Recommended conservative management.  Diverticular abscess  : Appears residual. Does not appear to be amenable to percutaneous drainage. Continue IV antibiotics.  No current indication for surgery.   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   Surgery was deferred secondary to patient's body habitus.   She is working on weight loss prior to scheduling surgery.   No current indication for emergency surgery. If patient improves with conservative management, would recommend follow-up with [facility].   Acute pyelonephritis with cystitis:  Continue antibiotics as above.  Follow urine cultures.   Obstructive sleep apnea: CPAP offered.  Patient declined.   Obesity hypoventilation syndrome:  Patient has had mild hypoxia earlier.  This could be related to this.   Continue outpatient treatment.   Morbid obesity: Dietary counseling.   Chronic diastolic heart failure:  Appears compensated at the moment.  Would be cautious with sepsis fluid management   Type 2 diabetes: Non-insulin-dependent.   Recent hemoglobin A1c 6.1 showing good control.   Sliding scale insulin will be initiated   Essential hypertension:  Patient's blood pressure is low with sepsis.  Will hold antihypertensives temporarily.   GERD: Continue pantoprazole 40 mg daily   Hypokalemia: Replaced.  Continue to monitor   Hypothyroidism: Continue levothyroxine.  Normochromic microcytic anemia: Hemoglobin dropped from 7.2-6.8. Transfuse 1 unit PRBC, no visible signs of any active bleeding.   DVT prophylaxis: Lovenox. Code Status: Full code.. Family Communication:No family at bed side Disposition Plan:    Status is: Inpatient Remains inpatient appropriate because: Severity of illness.   Consultants:  General Surgery  Procedures:  Antimicrobials:  Anti-infectives (From admission, onward)    Start     Dose/Rate Route Frequency Ordered Stop   02/07/24 2200  vancomycin (VANCOREADY) IVPB 1750 mg/350  mL  Status:  Discontinued         1,750 mg 175 mL/hr over 120 Minutes Intravenous Every 24 hours 02/07/24 0026 02/07/24 1116   02/07/24 2000  cefTRIAXone (ROCEPHIN) 2 g in sodium chloride 0.9 % 100 mL IVPB        2 g 200 mL/hr over 30 Minutes Intravenous Every 24 hours 02/07/24 1116     02/07/24 1730  ceFEPIme (MAXIPIME) 2 g in sodium chloride 0.9 % 100 mL IVPB  Status:  Discontinued        2 g 200 mL/hr over 30 Minutes Intravenous  Once 02/07/24 1634 02/07/24 1640   02/07/24 1730  metroNIDAZOLE (FLAGYL) IVPB 500 mg  Status:  Discontinued        500 mg 100 mL/hr over 60 Minutes Intravenous Every 12 hours 02/07/24 1634 02/07/24 1640   02/07/24 1730  vancomycin (VANCOCIN) IVPB 1000 mg/200 mL premix  Status:  Discontinued        1,000 mg 200 mL/hr over 60 Minutes Intravenous  Once 02/07/24 1634 02/07/24 1640   02/07/24 1200  metroNIDAZOLE (FLAGYL) IVPB 500 mg        500 mg 100 mL/hr over 60 Minutes Intravenous Every 12 hours 02/07/24 1116     02/07/24 0600  ceFEPIme (MAXIPIME) 2 g in sodium chloride 0.9 % 100 mL IVPB  Status:  Discontinued        2 g 200 mL/hr over 30 Minutes Intravenous Every 8 hours 02/07/24 0026 02/07/24 1116   02/07/24 0030  vancomycin (VANCOREADY) IVPB 2000 mg/400 mL        2,000 mg 200 mL/hr over 120 Minutes Intravenous  Once 02/07/24 0024 02/07/24 0359   02/06/24 2145  metroNIDAZOLE (FLAGYL) IVPB 500 mg        500 mg 100 mL/hr over 60 Minutes Intravenous  Once 02/06/24 2133 02/06/24 2250   02/06/24 1930  cefTRIAXone (ROCEPHIN) 2 g in sodium chloride 0.9 % 100 mL IVPB        2 g 200 mL/hr over 30 Minutes Intravenous Once 02/06/24 1923 02/06/24 2117   02/06/24 1930  azithromycin (ZITHROMAX) 500 mg in sodium chloride 0.9 % 250 mL IVPB        500 mg 250 mL/hr over 60 Minutes Intravenous  Once 02/06/24 1923 02/06/24 2117      Subjective: Patient was seen and examined at bedside.  Overnight events noted.   Patient reports slightly feeling better, abdominal pain has improved.   Hemoglobin  dropped requiring blood transfusion.  Objective: Vitals:   02/08/24 0949 02/08/24 1100 02/08/24 1110 02/08/24 1125  BP:  (!) 116/57 (!) 116/57 (!) 119/57  Pulse:  86 86 82  Resp:  20 20 (!) 24  Temp:  98.7 F (37.1 C) 98.7 F (37.1 C) 99.5 F (37.5 C)  TempSrc:  Oral Oral Oral  SpO2:  98% 95% 90%  Weight: (!) 174.5 kg     Height: 5\' 3"  (1.6 m)       Intake/Output Summary (Last 24 hours) at 02/08/2024 1142 Last data filed at 02/08/2024 0400 Gross per 24 hour  Intake 1072.12 ml  Output --  Net 1072.12 ml   Filed Weights   02/08/24 0949  Weight: (!) 174.5 kg    Examination:  General exam: Appears calm and comfortable, morbidly obese, not in any acute distress. Respiratory system: Clear to auscultation. Respiratory effort normal.  RR 15 Cardiovascular system: S1 & S2 heard, RRR. No JVD, murmurs, rubs, gallops or clicks. Gastrointestinal system:  Abdomen is non distended, soft and Mildly tender. Normal bowel sounds heard. Central nervous system: Alert and oriented x 3. No focal neurological deficits. Extremities: No edema, no cyanosis, no clubbing. Skin: No rashes, lesions or ulcers Psychiatry: Judgement and insight appear normal. Mood & affect appropriate.     Data Reviewed: I have personally reviewed following labs and imaging studies  CBC: Recent Labs  Lab 02/06/24 1934 02/07/24 1645 02/08/24 0343 02/08/24 0756  WBC 12.5* 14.7* 12.6*  --   NEUTROABS 11.9*  --   --   --   HGB 7.8* 7.2* 6.5* 6.8*  HCT 26.7* 25.5* 23.0* 24.4*  MCV 72.6* 74.3* 74.4*  --   PLT 300 267 243  --    Basic Metabolic Panel: Recent Labs  Lab 02/06/24 1934 02/07/24 1645 02/08/24 0343  NA 138 137 139  K 3.4* 3.5 3.5  CL 102 104 108  CO2 23 23 23   GLUCOSE 144* 108* 96  BUN 23* 25* 22*  CREATININE 1.34* 1.26* 1.32*  CALCIUM 8.9 8.5* 8.3*  MG  --   --  2.1  PHOS  --   --  3.4   GFR: Estimated Creatinine Clearance: 87.8 mL/min (A) (by C-G formula based on SCr of 1.32 mg/dL  (H)). Liver Function Tests: Recent Labs  Lab 02/06/24 1934 02/07/24 1645 02/08/24 0343  AST 22 17 11*  ALT 13 12 10   ALKPHOS 83 81 70  BILITOT 1.0 0.7 0.7  PROT 7.1 6.9 6.1*  ALBUMIN 3.1* 3.0* 2.8*   No results for input(s): "LIPASE", "AMYLASE" in the last 168 hours. No results for input(s): "AMMONIA" in the last 168 hours. Coagulation Profile: Recent Labs  Lab 02/06/24 1934 02/07/24 1645  INR 1.2 1.3*   Cardiac Enzymes: No results for input(s): "CKTOTAL", "CKMB", "CKMBINDEX", "TROPONINI" in the last 168 hours. BNP (last 3 results) No results for input(s): "PROBNP" in the last 8760 hours. HbA1C: No results for input(s): "HGBA1C" in the last 72 hours. CBG: No results for input(s): "GLUCAP" in the last 168 hours. Lipid Profile: No results for input(s): "CHOL", "HDL", "LDLCALC", "TRIG", "CHOLHDL", "LDLDIRECT" in the last 72 hours. Thyroid Function Tests: No results for input(s): "TSH", "T4TOTAL", "FREET4", "T3FREE", "THYROIDAB" in the last 72 hours. Anemia Panel: No results for input(s): "VITAMINB12", "FOLATE", "FERRITIN", "TIBC", "IRON", "RETICCTPCT" in the last 72 hours. Sepsis Labs: Recent Labs  Lab 02/06/24 1950 02/06/24 2303  LATICACIDVEN 4.1* 1.4    Recent Results (from the past 240 hours)  Blood Culture (routine x 2)     Status: Abnormal (Preliminary result)   Collection Time: 02/06/24  7:33 PM   Specimen: BLOOD  Result Value Ref Range Status   Specimen Description   Final    BLOOD Performed at [facility], [address] [place], [place] [address]    Special Requests   Final    BOTTLES DRAWN AEROBIC AND ANAEROBIC Blood Culture adequate volume Performed at [facility], [address], [place], [place] [address]    Culture  Setup Time   Final    GRAM NEGATIVE RODS IN BOTH AEROBIC AND ANAEROBIC BOTTLES CRITICAL RESULT CALLED TO, READ BACK BY AND VERIFIED WITH: PHARMD T GREEN [id number] AT 1040 BY CM    Culture (A)   Final    ESCHERICHIA COLI SUSCEPTIBILITIES TO FOLLOW Performed at [facility], [address] [place], [place] [address]    Report Status PENDING  Incomplete  Blood Culture ID Panel (Reflexed)  Status: Abnormal   Collection Time: 02/06/24  7:33 PM  Result Value Ref Range Status   Enterococcus faecalis NOT DETECTED NOT DETECTED Final   Enterococcus Faecium NOT DETECTED NOT DETECTED Final   Listeria monocytogenes NOT DETECTED NOT DETECTED Final   Staphylococcus species NOT DETECTED NOT DETECTED Final   Staphylococcus aureus (BCID) NOT DETECTED NOT DETECTED Final   Staphylococcus epidermidis NOT DETECTED NOT DETECTED Final   Staphylococcus lugdunensis NOT DETECTED NOT DETECTED Final   Streptococcus species NOT DETECTED NOT DETECTED Final   Streptococcus agalactiae NOT DETECTED NOT DETECTED Final   Streptococcus pneumoniae NOT DETECTED NOT DETECTED Final   Streptococcus pyogenes NOT DETECTED NOT DETECTED Final   A.calcoaceticus-baumannii NOT DETECTED NOT DETECTED Final   Bacteroides fragilis NOT DETECTED NOT DETECTED Final   Enterobacterales DETECTED (A) NOT DETECTED Final    Comment: Enterobacterales represent a large order of gram negative bacteria, not a single organism. CRITICAL RESULT CALLED TO, READ BACK BY AND VERIFIED WITH: PHARMD T GREEN [id number] AT 1040 BY CM    Enterobacter cloacae complex NOT DETECTED NOT DETECTED Final   Escherichia coli DETECTED (A) NOT DETECTED Final    Comment: CRITICAL RESULT CALLED TO, READ BACK BY AND VERIFIED WITH: PHARMD T GREEN [id number] AT 1040 BY CM    Klebsiella aerogenes NOT DETECTED NOT DETECTED Final   Klebsiella oxytoca NOT DETECTED NOT DETECTED Final   Klebsiella pneumoniae NOT DETECTED NOT DETECTED Final   Proteus species NOT DETECTED NOT DETECTED Final   Salmonella species NOT DETECTED NOT DETECTED Final   Serratia marcescens NOT DETECTED NOT DETECTED Final   Haemophilus influenzae NOT DETECTED NOT DETECTED Final   Neisseria  meningitidis NOT DETECTED NOT DETECTED Final   Pseudomonas aeruginosa NOT DETECTED NOT DETECTED Final   Stenotrophomonas maltophilia NOT DETECTED NOT DETECTED Final   Candida albicans NOT DETECTED NOT DETECTED Final   Candida auris NOT DETECTED NOT DETECTED Final   Candida glabrata NOT DETECTED NOT DETECTED Final   Candida krusei NOT DETECTED NOT DETECTED Final   Candida parapsilosis NOT DETECTED NOT DETECTED Final   Candida tropicalis NOT DETECTED NOT DETECTED Final   Cryptococcus neoformans/gattii NOT DETECTED NOT DETECTED Final   CTX-M ESBL NOT DETECTED NOT DETECTED Final   Carbapenem resistance IMP NOT DETECTED NOT DETECTED Final   Carbapenem resistance KPC NOT DETECTED NOT DETECTED Final   Carbapenem resistance NDM NOT DETECTED NOT DETECTED Final   Carbapenem resist OXA 48 LIKE NOT DETECTED NOT DETECTED Final   Carbapenem resistance VIM NOT DETECTED NOT DETECTED Final    Comment: Performed at [facility], [address] [place], [place] [address]  Resp panel by RT-PCR (RSV, Flu A&B, Covid) Anterior Nasal Swab     Status: None   Collection Time: 02/06/24  8:00 PM   Specimen: Anterior Nasal Swab  Result Value Ref Range Status   SARS Coronavirus 2 by RT PCR NEGATIVE NEGATIVE Final    Comment: (NOTE) SARS-CoV-2 target nucleic acids are NOT DETECTED.  The SARS-CoV-2 RNA is generally detectable in upper respiratory specimens during the acute phase of infection. The lowest concentration of SARS-CoV-2 viral copies this assay can detect is 138 copies/mL. A negative result does not preclude SARS-Cov-2 infection and should not be used as the sole basis for treatment or other patient management decisions. A negative result may occur with  improper specimen collection/handling, submission of specimen other than nasopharyngeal swab, presence of viral mutation(s) within the areas targeted by this assay, and inadequate number of viral  copies(<138 copies/mL). A negative result must be  combined with clinical observations, patient history, and epidemiological information. The expected result is Negative.  Fact Sheet for Patients:  BloggerCourse.com  Fact Sheet for Healthcare Providers:  SeriousBroker.it  This test is no t yet approved or cleared by the [place] FDA and  has been authorized for detection and/or diagnosis of SARS-CoV-2 by FDA under an Emergency Use Authorization (EUA). This EUA will remain  in effect (meaning this test can be used) for the duration of the COVID-19 declaration under Section 564(b)(1) of the Act, 21 U.S.C.section 360bbb-3(b)(1), unless the authorization is terminated  or revoked sooner.       Influenza A by PCR NEGATIVE NEGATIVE Final   Influenza B by PCR NEGATIVE NEGATIVE Final    Comment: (NOTE) The Xpert Xpress SARS-CoV-2/FLU/RSV plus assay is intended as an aid in the diagnosis of influenza from Nasopharyngeal swab specimens and should not be used as a sole basis for treatment. Nasal washings and aspirates are unacceptable for Xpert Xpress SARS-CoV-2/FLU/RSV testing.  Fact Sheet for Patients: BloggerCourse.com  Fact Sheet for Healthcare Providers: SeriousBroker.it  This test is not yet approved or cleared by the [place] FDA and has been authorized for detection and/or diagnosis of SARS-CoV-2 by FDA under an Emergency Use Authorization (EUA). This EUA will remain in effect (meaning this test can be used) for the duration of the COVID-19 declaration under Section 564(b)(1) of the Act, 21 U.S.C. section 360bbb-3(b)(1), unless the authorization is terminated or revoked.     Resp Syncytial Virus by PCR NEGATIVE NEGATIVE Final    Comment: (NOTE) Fact Sheet for Patients: BloggerCourse.com  Fact Sheet for Healthcare Providers: SeriousBroker.it  This test is not yet  approved or cleared by the [place] FDA and has been authorized for detection and/or diagnosis of SARS-CoV-2 by FDA under an Emergency Use Authorization (EUA). This EUA will remain in effect (meaning this test can be used) for the duration of the COVID-19 declaration under Section 564(b)(1) of the Act, 21 U.S.C. section 360bbb-3(b)(1), unless the authorization is terminated or revoked.  Performed at [facility], [address] [place], [place] [address]   Urine Culture     Status: Abnormal (Preliminary result)   Collection Time: 02/06/24 11:35 PM   Specimen: Urine, Random  Result Value Ref Range Status   Specimen Description   Final    URINE, RANDOM Performed at [facility], [address] [place], [place] [address]    Special Requests   Final    NONE Reflexed from [id number] Performed at [facility], [address], [place], [place] [address]    Culture (A)  Final    10,000 COLONIES/mL GRAM NEGATIVE RODS IDENTIFICATION TO FOLLOW 70,000 COLONIES/mL IDENTIFICATION TO FOLLOW Performed at [facility], [address], [place], [place] [address]    Report Status PENDING  Incomplete   Radiology Studies: CT ABDOMEN PELVIS W CONTRAST Result Date: 02/06/2024 CLINICAL DATA:  Sepsis, fever, chills EXAM: CT ABDOMEN AND PELVIS WITH CONTRAST TECHNIQUE: Multidetector CT imaging of the abdomen and pelvis was performed using the standard protocol following bolus administration of intravenous contrast. RADIATION DOSE REDUCTION: This exam was performed according to the departmental dose-optimization program which includes automated exposure control, adjustment of the mA and/or kV according to patient size and/or use of iterative reconstruction technique. CONTRAST:  80mL OMNIPAQUE IOHEXOL 300 MG/ML  SOLN COMPARISON:  None Available. FINDINGS: Lower chest: No acute abnormality. Hepatobiliary: Cholelithiasis without  superimposed  pericholecystic inflammatory change. Mildly progressive, moderate hepatomegaly. Liver otherwise unremarkable; no enhancing intrahepatic mass identified. No intra or extrahepatic biliary ductal dilation. Pancreas: Unremarkable. No pancreatic ductal dilatation or surrounding inflammatory changes. Spleen: Moderate splenomegaly with the spleen measuring 15.9 cm in greatest dimension, slightly enlarged since prior examination. Adrenals/Urinary Tract: The adrenal glands are unremarkable. The kidneys are normal in size and position. Since the prior examination there has developed mild perinephric inflammatory stranding suggesting an underlying inflammatory process such as pyelonephritis. No hydronephrosis. No intrarenal or ureteral calculi. There is server frontal bladder wall thickening in keeping with an infectious or inflammatory cystitis. Additionally, there has developed punctate intraluminal gas within the bladder lumen non dependently. Inflammatory gas and fluid collection related to perforated diverticulitis is again seen abutting the anterior wall of the bladder, best seen on image # 74/3. Together, a colovesicular fistula is not excluded. Stomach/Bowel: Moderate pancolonic diverticulosis noted. There are inflammatory changes involving the mid sigmoid colon similar prior examination though less severe reflecting recurrent mild or residual diverticulitis. No, however, that an infiltrative mass could appear similarly, particularly given its persistence. A a loculated gas and fluid collection is again seen extending from the inflamed portion: 2 anterior bladder were there is focal bladder wall thickening in keeping with a residual diverticular abscess and possible colovesicular fistula. The stomach, small bowel, and large bowel are otherwise unremarkable. Appendix normal. No free intraperitoneal gas or fluid. Vascular/Lymphatic: No significant vascular findings are present. No enlarged abdominal or pelvic lymph  nodes. Reproductive: A complex multiloculated cystic lesion is seen involving the left adnexa, not well characterized on this examination but better delineated since prior exam with improvement in surrounding inflammatory change. The pelvic organs are otherwise unremarkable. Other: Mild diffuse subcutaneous body wall edema. Musculoskeletal: No acute or significant osseous findings. IMPRESSION: 1. Moderate pancolonic diverticulosis. Recurrent mild or residual diverticulitis involving the mid sigmoid colon similar prior examination though less severe reflecting recurrent mild or residual diverticulitis. Endoscopy should be considered, however, to exclude underlying infiltrative mass. 2. Residual diverticular abscess extending from the inflamed portion of the sigmoid colon to the anterior bladder wall where there is focal bladder wall thickening in keeping with an infectious or inflammatory cystitis. Additionally, there has developed punctate intraluminal gas within the bladder lumen non dependently. Together, a colovesicular fistula is not excluded. 3. Mild perinephric inflammatory stranding suggesting an underlying inflammatory process such as pyelonephritis. Correlation with urinalysis and urine culture may be helpful. 4. Complex multiloculated cystic lesion involving the left adnexa, not well characterized on this examination but better delineated since prior exam with improvement in surrounding inflammatory change. This could be better assessed with dedicated pelvic sonography once the patient's acute issues have resolved 5. Cholelithiasis. 6. Mildly progressive, moderate hepatosplenomegaly. Electronically Signed   By: Helyn Numbers M.D.   On: 02/06/2024 23:16   DG Chest Port 1 View Result Date: 02/06/2024 CLINICAL DATA:  Cough and fever. EXAM: PORTABLE CHEST 1 VIEW COMPARISON:  None Available. FINDINGS: The heart is upper normal in size. The cardiomediastinal contours are normal. Subsegmental atelectasis in  the right lower lung zone. Pulmonary vasculature is normal. No consolidation, pleural effusion, or pneumothorax. No acute osseous abnormalities are seen. IMPRESSION: Subsegmental atelectasis in the right lower lung zone. Electronically Signed   By: Narda Rutherford M.D.   On: 02/06/2024 21:49   Scheduled Meds:  enoxaparin (LOVENOX) injection  40 mg Subcutaneous Q24H   lamoTRIgine  200 mg Oral Daily   levothyroxine  150 mcg Oral  [id number]   rosuvastatin  20 mg Oral Daily   sertraline  200 mg Oral Daily   [START ON 02/11/2024] Vitamin D (Ergocalciferol)  50,000 Units Oral Q7 days   Continuous Infusions:  cefTRIAXone (ROCEPHIN)  IV 2 g (02/07/24 2029)   lactated ringers Stopped (02/07/24 1814)   metronidazole 500 mg (02/08/24 0028)     LOS: 1 day    Time spent: 50 mins    Willeen Niece, MD [facility]   If 7PM-7AM, please contact night-coverage

## 2024-02-08 NOTE — Plan of Care (Signed)

## 2024-02-08 NOTE — Progress Notes (Signed)
   02/08/24 2217  BiPAP/CPAP/SIPAP  BiPAP/CPAP/SIPAP Pt Type Adult (pt declined assistance with cpap, comfortable with self-placement)  BiPAP/CPAP/SIPAP DREAMSTATIOND  Mask Type Full face mask (per home regimen)  Dentures removed? Not applicable  Mask Size Small  Flow Rate 3 lpm (per home regimen)  Patient Home Equipment No  Auto Titrate Yes (settings per chart notes (12-20cm), pt stated settings were comfortable last night)  CPAP/SIPAP surface wiped down Yes

## 2024-02-09 DIAGNOSIS — A419 Sepsis, unspecified organism: Secondary | ICD-10-CM | POA: Diagnosis not present

## 2024-02-09 DIAGNOSIS — R509 Fever, unspecified: Secondary | ICD-10-CM

## 2024-02-09 DIAGNOSIS — R6520 Severe sepsis without septic shock: Secondary | ICD-10-CM | POA: Diagnosis not present

## 2024-02-09 LAB — MAGNESIUM: Magnesium: 2.2 mg/dL (ref 1.7–2.4)

## 2024-02-09 LAB — BPAM RBC
Blood Product Expiration Date: 202504112359
ISSUE DATE / TIME: 202503131100
Unit Type and Rh: 9500

## 2024-02-09 LAB — CBC
HCT: 27.9 % — ABNORMAL LOW (ref 36.0–46.0)
Hemoglobin: 7.6 g/dL — ABNORMAL LOW (ref 12.0–15.0)
MCH: 21.3 pg — ABNORMAL LOW (ref 26.0–34.0)
MCHC: 27.2 g/dL — ABNORMAL LOW (ref 30.0–36.0)
MCV: 78.2 fL — ABNORMAL LOW (ref 80.0–100.0)
Platelets: 259 10*3/uL (ref 150–400)
RBC: 3.57 MIL/uL — ABNORMAL LOW (ref 3.87–5.11)
RDW: 21.5 % — ABNORMAL HIGH (ref 11.5–15.5)
WBC: 10.4 10*3/uL (ref 4.0–10.5)
nRBC: 0 % (ref 0.0–0.2)

## 2024-02-09 LAB — CULTURE, BLOOD (ROUTINE X 2): Special Requests: ADEQUATE

## 2024-02-09 LAB — BASIC METABOLIC PANEL
Anion gap: 10 (ref 5–15)
BUN: 17 mg/dL (ref 6–20)
CO2: 23 mmol/L (ref 22–32)
Calcium: 8.8 mg/dL — ABNORMAL LOW (ref 8.9–10.3)
Chloride: 102 mmol/L (ref 98–111)
Creatinine, Ser: 1.18 mg/dL — ABNORMAL HIGH (ref 0.44–1.00)
GFR, Estimated: 59 mL/min — ABNORMAL LOW (ref >60)
Glucose, Bld: 104 mg/dL — ABNORMAL HIGH (ref 70–99)
Potassium: 3.2 mmol/L — ABNORMAL LOW (ref 3.5–5.1)
Sodium: 135 mmol/L (ref 135–145)

## 2024-02-09 LAB — TYPE AND SCREEN
ABO/RH(D): O NEG
Antibody Screen: NEGATIVE
Unit division: 0

## 2024-02-09 LAB — PHOSPHORUS: Phosphorus: 2.7 mg/dL (ref 2.5–4.6)

## 2024-02-09 MED ORDER — LEVOFLOXACIN IN D5W 750 MG/150ML IV SOLN
750.0000 mg | INTRAVENOUS | Status: DC
  Administered 2024-02-09: 750 mg via INTRAVENOUS
  Filled 2024-02-09: qty 150

## 2024-02-09 MED ORDER — POTASSIUM CHLORIDE CRYS ER 20 MEQ PO TBCR
40.0000 meq | EXTENDED_RELEASE_TABLET | ORAL | Status: AC
  Administered 2024-02-09 (×2): 40 meq via ORAL
  Filled 2024-02-09: qty 2
  Filled 2024-02-09: qty 4

## 2024-02-09 MED ORDER — POLYETHYLENE GLYCOL 3350 17 G PO PACK
17.0000 g | PACK | Freq: Every day | ORAL | Status: DC
  Filled 2024-02-09 (×2): qty 1

## 2024-02-09 MED ORDER — SODIUM CHLORIDE 0.9 % IV SOLN
3.0000 g | Freq: Four times a day (QID) | INTRAVENOUS | Status: DC
  Administered 2024-02-09: 3 g via INTRAVENOUS
  Filled 2024-02-09: qty 8

## 2024-02-09 MED ORDER — ENOXAPARIN SODIUM 80 MG/0.8ML IJ SOSY
80.0000 mg | PREFILLED_SYRINGE | INTRAMUSCULAR | Status: DC
  Administered 2024-02-09: 80 mg via SUBCUTANEOUS
  Filled 2024-02-09: qty 0.8

## 2024-02-09 MED ORDER — HYDROMORPHONE HCL 1 MG/ML IJ SOLN
0.5000 mg | Freq: Once | INTRAMUSCULAR | Status: AC
  Administered 2024-02-09: 0.5 mg via INTRAVENOUS
  Filled 2024-02-09: qty 0.5

## 2024-02-09 NOTE — Plan of Care (Signed)

## 2024-02-09 NOTE — Progress Notes (Signed)
 Subjective: Reports no abdominal pain.  Tolerating full liquids without nausea, bloating or any other issues.  Having bowel movements.  Would like to try to eat more.  ROS: See above, otherwise other systems negative  Objective: Vital signs in last 24 hours: Temp:  [98.3 F (36.8 C)-100 F (37.8 C)] 98.3 F (36.8 C) (03/14 0441) Pulse Rate:  [76-86] 76 (03/14 0441) Resp:  [18-24] 20 (03/14 0441) BP: (116-134)/(57-74) 134/70 (03/14 0441) SpO2:  [90 %-99 %] 99 % (03/14 0441) Weight:  [174.5 kg] 174.5 kg (03/13 0949)    Intake/Output from previous day: 03/13 0701 - 03/14 0700 In: 936 [P.O.:460; Blood:476] Out: -  Intake/Output this shift: No intake/output data recorded.  PE: Gen: NAD Abd: soft, NT, morbidly obese.  Lab Results:  Recent Labs    02/08/24 0343 02/08/24 0756 02/09/24 0330  WBC 12.6*  --  10.4  HGB 6.5* 6.8* 7.6*  HCT 23.0* 24.4* 27.9*  PLT 243  --  259   BMET Recent Labs    02/08/24 0343 02/09/24 0330  NA 139 135  K 3.5 3.2*  CL 108 102  CO2 23 23  GLUCOSE 96 104*  BUN 22* 17  CREATININE 1.32* 1.18*  CALCIUM 8.3* 8.8*   PT/INR Recent Labs    02/06/24 1934 02/07/24 1645  LABPROT 15.0 15.9*  INR 1.2 1.3*   CMP     Component Value Date/Time   NA 135 02/09/2024 0330   NA 142 11/13/2023 1325   K 3.2 (L) 02/09/2024 0330   CL 102 02/09/2024 0330   CO2 23 02/09/2024 0330   GLUCOSE 104 (H) 02/09/2024 0330   BUN 17 02/09/2024 0330   BUN 20 11/13/2023 1325   CREATININE 1.18 (H) 02/09/2024 0330   CREATININE 1.06 11/14/2016 1050   CALCIUM 8.8 (L) 02/09/2024 0330   CALCIUM 9.6 06/26/2007 0000   PROT 6.1 (L) 02/08/2024 0343   PROT 6.6 11/13/2023 1325   ALBUMIN 2.8 (L) 02/08/2024 0343   ALBUMIN 3.7 (L) 11/13/2023 1325   AST 11 (L) 02/08/2024 0343   ALT 10 02/08/2024 0343   ALKPHOS 70 02/08/2024 0343   BILITOT 0.7 02/08/2024 0343   BILITOT <0.2 11/13/2023 1325   GFRNONAA 59 (L) 02/09/2024 0330   GFRAA 81 11/11/2020 1211    Lipase     Component Value Date/Time   LIPASE 48.0 09/23/2022 1134       Studies/Results: No results found.   Anti-infectives: Anti-infectives (From admission, onward)    Start     Dose/Rate Route Frequency Ordered Stop   02/09/24 1000  Ampicillin-Sulbactam (UNASYN) 3 g in sodium chloride 0.9 % 100 mL IVPB        3 g 200 mL/hr over 30 Minutes Intravenous Every 6 hours 02/09/24 0842     02/07/24 2200  vancomycin (VANCOREADY) IVPB 1750 mg/350 mL  Status:  Discontinued        1,750 mg 175 mL/hr over 120 Minutes Intravenous Every 24 hours 02/07/24 0026 02/07/24 1116   02/07/24 2000  cefTRIAXone (ROCEPHIN) 2 g in sodium chloride 0.9 % 100 mL IVPB  Status:  Discontinued        2 g 200 mL/hr over 30 Minutes Intravenous Every 24 hours 02/07/24 1116 02/09/24 0842   02/07/24 1730  ceFEPIme (MAXIPIME) 2 g in sodium chloride 0.9 % 100 mL IVPB  Status:  Discontinued        2 g 200 mL/hr over 30 Minutes Intravenous  Once 02/07/24 1634  02/07/24 1640   02/07/24 1730  metroNIDAZOLE (FLAGYL) IVPB 500 mg  Status:  Discontinued        500 mg 100 mL/hr over 60 Minutes Intravenous Every 12 hours 02/07/24 1634 02/07/24 1640   02/07/24 1730  vancomycin (VANCOCIN) IVPB 1000 mg/200 mL premix  Status:  Discontinued        1,000 mg 200 mL/hr over 60 Minutes Intravenous  Once 02/07/24 1634 02/07/24 1640   02/07/24 1200  metroNIDAZOLE (FLAGYL) IVPB 500 mg  Status:  Discontinued        500 mg 100 mL/hr over 60 Minutes Intravenous Every 12 hours 02/07/24 1116 02/09/24 0842   02/07/24 0600  ceFEPIme (MAXIPIME) 2 g in sodium chloride 0.9 % 100 mL IVPB  Status:  Discontinued        2 g 200 mL/hr over 30 Minutes Intravenous Every 8 hours 02/07/24 0026 02/07/24 1116   02/07/24 0030  vancomycin (VANCOREADY) IVPB 2000 mg/400 mL        2,000 mg 200 mL/hr over 120 Minutes Intravenous  Once 02/07/24 0024 02/07/24 0359   02/06/24 2145  metroNIDAZOLE (FLAGYL) IVPB 500 mg        500 mg 100 mL/hr over 60 Minutes  Intravenous  Once 02/06/24 2133 02/06/24 2250   02/06/24 1930  cefTRIAXone (ROCEPHIN) 2 g in sodium chloride 0.9 % 100 mL IVPB        2 g 200 mL/hr over 30 Minutes Intravenous Once 02/06/24 1923 02/06/24 2117   02/06/24 1930  azithromycin (ZITHROMAX) 500 mg in sodium chloride 0.9 % 250 mL IVPB        500 mg 250 mL/hr over 60 Minutes Intravenous  Once 02/06/24 1923 02/06/24 2117        Assessment/Plan Sigmoid diverticulitis with CV fistula, complicated by possible pyelonephritis -Will advance to soft diet today -cont abx therapy for mild diverticulitis on scan, but also for UTI/pyelonephritis.  Urine cx in process.  White count has normalized and afebrile.   -patient would like to continue avoiding surgery and get back to [facility] at outpatient. -tx for anemia per primary team.  Due for iron infusion next week.  Appropriate response to transfusion yesterday.  FEN - FLD, IVFs per primary  VTE - Lovenox ID - Rocephin/Flagyl  Anemia - tx per primary Morbid obesity - planning bypass at [facility] DM HTN Hypothyroidism  I reviewed nursing notes, hospitalist notes, last 24 h vitals and pain scores, last 48 h intake and output, last 24 h labs and trends, and last 24 h imaging results.  If tolerating soft diet, from surgery standpoint okay to transition to oral antibiotics and discharge this afternoon versus tomorrow with follow-up with her colorectal surgeon at [facility].   LOS: 2 days    Berna Bue , MD [facility] Surgery 02/09/2024, 8:42 AM Please see Amion for pager number during day hours 7:00am-4:30pm or 7:00am -11:30am on weekends

## 2024-02-09 NOTE — Progress Notes (Signed)
 PROGRESS NOTE    Alexandra Beck  ZOX:[id number]  DOB: January 27, 1981  DOA: 02/06/2024 PCP: Alexandra Levins, MD Outpatient Specialists:   Hospital course:  43 year old female with morbid obesity, BMI 68, obesity hypoventilation syndrome, OSA, HTN, DM2, HFpEF, hypothyroidism with recurrent diverticulitis is admitted with sepsis.  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  Patient's blood cultures were positive for E. coli and urine cultures are positive for E. coli as well as Enterococcus faecalis.  Patient was seen by general surgery who note that she is not a surgical candidate due to body habitus.  Patient had been seen at [facility] by colorectal surgery who noted that she would have to have bariatric surgery before they could consider fixing her colovesicular fistula.  In house, patient has been started on cefepime and Flagyl with improvement in leukocytes and symptomatology.   Subjective:  Patient states she would like to go home and is wondering when she can go home.  Discussed that we need a plan for treatment of persistent bacteriuria with possibility of recurrent sepsis given inability to fix colovesicular fistula.  Patient states she knows the infectious disease consultants well given recurrent episodes of cellulitis in the past.   Objective: Vitals:   02/08/24 1500 02/08/24 2017 02/09/24 0441 02/09/24 1207  BP: 133/74 134/70 134/70 139/72  Pulse: 76 76 76 72  Resp: 18 18 20 18   Temp: 100 F (37.8 C) 98.3 F (36.8 C) 98.3 F (36.8 C) 98.1 F (36.7 C)  TempSrc: Oral Oral  Oral  SpO2: 97% 97% 99% 96%  Weight:      Height:        Intake/Output Summary (Last 24 hours) at 02/09/2024 1601 Last data filed at 02/09/2024 1500 Gross per 24 hour  Intake 241.83 ml  Output --  Net 241.83 ml   Filed Weights   02/08/24 0949  Weight: (!) 174.5 kg     Exam:  General: Morbidly obese female lying in bed in good spirits chatting with  nurse CVS: S1-S2, distant heart sounds Respiratory: Decreased air entry likely secondary to body habitus GI: Obese, positive bowel sounds, nontender to light or deep palpation LE: Warm and well-perfused Neuro: A/O x 3,  grossly nonfocal.  Psych: patient is logical and coherent, judgement and insight appear normal, mood and affect appropriate to situation.  Data Reviewed:  Basic Metabolic Panel: Recent Labs  Lab 02/06/24 1934 02/07/24 1645 02/08/24 0343 02/09/24 0330  NA 138 137 139 135  K 3.4* 3.5 3.5 3.2*  CL 102 104 108 102  CO2 23 23 23 23   GLUCOSE 144* 108* 96 104*  BUN 23* 25* 22* 17  CREATININE 1.34* 1.26* 1.32* 1.18*  CALCIUM 8.9 8.5* 8.3* 8.8*  MG  --   --  2.1 2.2  PHOS  --   --  3.4 2.7    CBC: Recent Labs  Lab 02/06/24 1934 02/08/24 0343 02/08/24 0756 02/09/24 0330  WBC 12.5* 12.6*  --  10.4  NEUTROABS 11.9*  --   --   --   HGB 7.8* 6.5* 6.8* 7.6*  HCT 26.7* 23.0* 24.4* 27.9*  MCV 72.6* 74.4*  --  78.2*  PLT 300 243  --  259     Scheduled Meds:  enoxaparin (LOVENOX) injection  80 mg Subcutaneous Q24H   lamoTRIgine  200 mg Oral Daily   levothyroxine  150 mcg Oral Q0600   polyethylene glycol  17 g Oral Daily   rosuvastatin  20  mg Oral Daily   sertraline  200 mg Oral Daily   [START ON 02/11/2024] Vitamin D (Ergocalciferol)  50,000 Units Oral Q7 days   Continuous Infusions:  levofloxacin (LEVAQUIN) IV 750 mg (02/09/24 1334)     Assessment & Plan:   Complicated diverticulitis with abscess Colovesical fistula Acute pyelonephritis E. coli bacteremia Severe sepsis--resolved Patient is symptomatically responded well to cefepime and vancomycin Today she is growing out E faecalis out of urine, recommendation is to change to IV levofloxacin which was done Appreciate ID consultation, will plan to discharge home tomorrow on Levaquin 500 p.o. daily for 3 more days There is no recommendation for prophylactic antibiotics due to development of  resistance Further management of colovesicular fistula per [facility] colorectal surgeon after bariatric surgery  Hypokalemia Will replete and recheck  Normocytic anemia ACD Patient was transfused 1 unit PRBC for hemoglobin 6.8, with appropriate response No evidence for active bleeding  OSA Obesity hypoventilation syndrome Morbid obesity Patient declined CPAP Weight management with bariatric surgery is planned  HFpEF Euvolemic  DM 2 Sugars under good control on present regimen  HTN BP is well-controlled off of antihypertensives today Patient will need to follow-up with PCP shortly after discharge to restart BP meds when warranted    DVT prophylaxis: Lovenox Code Status: Full Family Communication: None today     Studies: No results found.  Principal Problem:   Severe sepsis (HCC) Active Problems:   Diverticulitis of large intestine with abscess   Chronic diastolic CHF (congestive heart failure) (HCC)   Type 2 diabetes mellitus with obesity (HCC)   AKI (acute kidney injury) (HCC)   OSA (obstructive sleep apnea)   Obesity hypoventilation syndrome (HCC)   Hypokalemia   Hypertension associated with diabetes (HCC)   Hypothyroidism   GERD (gastroesophageal reflux disease)   Depression   Morbid obesity (HCC)   UTI (urinary tract infection)     Alexandra Beck Alexandra Beck, [facility]  If 7PM-7AM, please contact night-coverage [email or web address]   LOS: 2 days

## 2024-02-09 NOTE — Consult Note (Signed)
 [facility]       Reason for Consult: fever    Referring Physician: Dr. Luberta Robertson  Principal Problem:   Severe sepsis [facility]) Active Problems:   Hypertension associated with diabetes (HCC)   Morbid obesity (HCC)   OSA (obstructive sleep apnea)   Obesity hypoventilation syndrome (HCC)   Type 2 diabetes mellitus with obesity (HCC)   Hypothyroidism   GERD (gastroesophageal reflux disease)   Depression   AKI (acute kidney injury) (HCC)   Chronic diastolic CHF (congestive heart failure) (HCC)   Hypokalemia   Diverticulitis of large intestine with abscess   UTI (urinary tract infection)    enoxaparin (LOVENOX) injection  80 mg Subcutaneous Q24H   lamoTRIgine  200 mg Oral Daily   levothyroxine  150 mcg Oral Q0600   polyethylene glycol  17 g Oral Daily   rosuvastatin  20 mg Oral Daily   sertraline  200 mg Oral Daily   [START ON 02/11/2024] Vitamin D (Ergocalciferol)  50,000 Units Oral Q7 days    Recommendations: Continue Levaquin 500 mg p.o. once daily for 3 more days Ok from ID standpoint for discharge  Assessment: She has a fever of unknown etiology with possible sources including a viral infection versus current diverticulitis or urinary infection, though no particular symptoms to indicate either.  With her known fistula though, I favor treating as recommended with Levaquin and can use oral therapy and 500 mg once daily as above. She can then continue to follow-up with [facility]. I also discussed with her that prophylactic antibiotics not indicated as there are multiple organisms in the colon and often in the urinary bladder that can contribute and prophylactic antibiotics have generally just to resistance with no improvement in relapse of infection.  Evaluation of this patient requires complex antimicrobial therapy evaluation and counseling + isolation needs for disease transmission risk assessment and mitigation.   HPI: Alexandra Beck is a 43 y.o.  female with a history of morbid obesity, diabetes, chronic diastolic heart failure, obesity hypoventilation syndrome and obstructive sleep apnea among other issues, who comes in here with fever on the night of March 11.  She has a history of a colonic vesicular fistula and is followed at [facility] for potential surgical intervention at some point.  She is first undergoing evaluation for bariatric surgery to optimize her for any other surgery.  She has had issues with recurrent infections and diverticulitis.  Upon presentation this time, she did not have any particular symptoms other than the high fever including no new abdominal pain, no new diarrhea, no dysuria.  He was started on empiric antibiotics and has improved. He was on a full liquid diet and now advance to a soft diet and tolerating this well.  Review of Systems:  Constitutional: negative for fevers and chills All other systems reviewed and are negative    Past Medical History:  Diagnosis Date   Acute renal failure (ARF) (HCC) 11/2012    multifactorial-likely secondary to ATN in the setting of sepsis and hypotension, and also from rhabdomyolysis   Anemia    Cellulitis    Chronic diastolic CHF (congestive heart failure) (HCC)    Depression    Diabetes mellitus (HCC)    GERD (gastroesophageal reflux disease)    Hyperlipidemia    Hypertension    Hypothyroidism    Morbid obesity with BMI of 70 and over, adult (HCC)    Obesity hypoventilation syndrome (HCC)    OSA (obstructive sleep apnea)  PVC's (premature ventricular contractions)    Recurrent cellulitis of lower leg    LLE, venous insuff   Sepsis (HCC) 11/2012   Secondary to cellulitis   Sleep apnea    Venous insufficiency of leg     Social History   Tobacco Use   Smoking status: Former    Current packs/day: 0.00    Average packs/day: 0.5 packs/day for 8.0 years (4.0 ttl pk-yrs)    Types: Cigarettes    Start date: 2003    Quit date: 2011    Years since quitting: 14.2    Smokeless tobacco: Never  Vaping Use   Vaping status: Never Used  Substance Use Topics   Alcohol use: No    Alcohol/week: 0.0 standard drinks of alcohol   Drug use: No    Family History  Problem Relation Age of Onset   Hypertension Mother    Diabetes Mother    High Cholesterol Mother    Thyroid disease Mother    Sleep apnea Mother    Obesity Mother    Hypertension Father    Heart disease Father        before age 58   High Cholesterol Father    Sleep apnea Father    Obesity Father     Allergies  Allergen Reactions   Aspirin Hives, Swelling and Other (See Comments)    REACTION: throat swelling, hives  Other reaction(s): Unknown  REACTION: throat swelling, hives Other reaction(s): Unknown Other reaction(s): Unknown   Doxycycline Other (See Comments)    Abdominal pain, difficulty swallowing   Lisinopril Cough   Niacin     Other reaction(s): Unknown Other reaction(s): Unknown   Niacin And Related     Other reaction(s): Unknown Other reaction(s): Unknown   Niaspan [Niacin Er (Antihyperlipidemic)]     Caused flushing   Wound Dressing Adhesive Itching    Welts and burning of the skin   Wound Dressings Itching    Welts and burning of the skin    Physical Exam: Constitutional: in no apparent distress  Vitals:   02/09/24 0441 02/09/24 1207  BP: 134/70 139/72  Pulse: 76 72  Resp: 20 18  Temp: 98.3 F (36.8 C) 98.1 F (36.7 C)  SpO2: 99% 96%   EYES: anicteric Respiratory: normal respiratory effort  Lab Results  Component Value Date   WBC 10.4 02/09/2024   HGB 7.6 (L) 02/09/2024   HCT 27.9 (L) 02/09/2024   MCV 78.2 (L) 02/09/2024   PLT 259 02/09/2024    Lab Results  Component Value Date   CREATININE 1.18 (H) 02/09/2024   BUN 17 02/09/2024   NA 135 02/09/2024   K 3.2 (L) 02/09/2024   CL 102 02/09/2024   CO2 23 02/09/2024    Lab Results  Component Value Date   ALT 10 02/08/2024   AST 11 (L) 02/08/2024   ALKPHOS 70 02/08/2024      Microbiology: Recent Results (from the past 240 hours)  Blood Culture (routine x 2)     Status: Abnormal   Collection Time: 02/06/24  7:33 PM   Specimen: BLOOD  Result Value Ref Range Status   Specimen Description   Final    BLOOD Performed at [facility] [address], [place], [place] [address]    Special Requests   Final    BOTTLES DRAWN AEROBIC AND ANAEROBIC Blood Culture adequate volume Performed at [facility], [address] [place], [place] [address]    Culture  Setup Time  Final    GRAM NEGATIVE RODS IN BOTH AEROBIC AND ANAEROBIC BOTTLES CRITICAL RESULT CALLED TO, READ BACK BY AND VERIFIED WITH: PHARMD T GREEN [id number] AT 1040 BY CM Performed at [facility], [address], [place], [place] [address]    Culture ESCHERICHIA COLI (A)  Final   Report Status 02/09/2024 FINAL  Final   Organism ID, Bacteria ESCHERICHIA COLI  Final   Organism ID, Bacteria ESCHERICHIA COLI  Final      Susceptibility   Escherichia coli - KIRBY BAUER*    CEFAZOLIN INTERMEDIATE Intermediate    Escherichia coli - MIC*    AMPICILLIN >=32 RESISTANT Resistant     CEFEPIME <=0.12 SENSITIVE Sensitive     CEFTAZIDIME <=1 SENSITIVE Sensitive     CEFTRIAXONE <=0.25 SENSITIVE Sensitive     CIPROFLOXACIN <=0.25 SENSITIVE Sensitive     GENTAMICIN >=16 RESISTANT Resistant     IMIPENEM <=0.25 SENSITIVE Sensitive     TRIMETH/SULFA <=20 SENSITIVE Sensitive     AMPICILLIN/SULBACTAM 16 INTERMEDIATE Intermediate     PIP/TAZO <=4 SENSITIVE Sensitive ug/mL    * ESCHERICHIA COLI    ESCHERICHIA COLI  Blood Culture ID Panel (Reflexed)     Status: Abnormal   Collection Time: 02/06/24  7:33 PM  Result Value Ref Range Status   Enterococcus faecalis NOT DETECTED NOT DETECTED Final   Enterococcus Faecium NOT DETECTED NOT DETECTED Final   Listeria monocytogenes NOT DETECTED NOT DETECTED Final   Staphylococcus species NOT DETECTED NOT DETECTED Final   Staphylococcus  aureus (BCID) NOT DETECTED NOT DETECTED Final   Staphylococcus epidermidis NOT DETECTED NOT DETECTED Final   Staphylococcus lugdunensis NOT DETECTED NOT DETECTED Final   Streptococcus species NOT DETECTED NOT DETECTED Final   Streptococcus agalactiae NOT DETECTED NOT DETECTED Final   Streptococcus pneumoniae NOT DETECTED NOT DETECTED Final   Streptococcus pyogenes NOT DETECTED NOT DETECTED Final   A.calcoaceticus-baumannii NOT DETECTED NOT DETECTED Final   Bacteroides fragilis NOT DETECTED NOT DETECTED Final   Enterobacterales DETECTED (A) NOT DETECTED Final    Comment: Enterobacterales represent a large order of gram negative bacteria, not a single organism. CRITICAL RESULT CALLED TO, READ BACK BY AND VERIFIED WITH: PHARMD T GREEN [id number] AT 1040 BY CM    Enterobacter cloacae complex NOT DETECTED NOT DETECTED Final   Escherichia coli DETECTED (A) NOT DETECTED Final    Comment: CRITICAL RESULT CALLED TO, READ BACK BY AND VERIFIED WITH: PHARMD T GREEN [id number] AT 1040 BY CM    Klebsiella aerogenes NOT DETECTED NOT DETECTED Final   Klebsiella oxytoca NOT DETECTED NOT DETECTED Final   Klebsiella pneumoniae NOT DETECTED NOT DETECTED Final   Proteus species NOT DETECTED NOT DETECTED Final   Salmonella species NOT DETECTED NOT DETECTED Final   Serratia marcescens NOT DETECTED NOT DETECTED Final   Haemophilus influenzae NOT DETECTED NOT DETECTED Final   Neisseria meningitidis NOT DETECTED NOT DETECTED Final   Pseudomonas aeruginosa NOT DETECTED NOT DETECTED Final   Stenotrophomonas maltophilia NOT DETECTED NOT DETECTED Final   Candida albicans NOT DETECTED NOT DETECTED Final   Candida auris NOT DETECTED NOT DETECTED Final   Candida glabrata NOT DETECTED NOT DETECTED Final   Candida krusei NOT DETECTED NOT DETECTED Final   Candida parapsilosis NOT DETECTED NOT DETECTED Final   Candida tropicalis NOT DETECTED NOT DETECTED Final   Cryptococcus neoformans/gattii NOT DETECTED NOT DETECTED Final    CTX-M ESBL NOT DETECTED NOT DETECTED Final   Carbapenem resistance IMP NOT DETECTED NOT DETECTED Final  Carbapenem resistance KPC NOT DETECTED NOT DETECTED Final   Carbapenem resistance NDM NOT DETECTED NOT DETECTED Final   Carbapenem resist OXA 48 LIKE NOT DETECTED NOT DETECTED Final   Carbapenem resistance VIM NOT DETECTED NOT DETECTED Final    Comment: Performed at [facility], [address], [place], [place] [address]  Resp panel by RT-PCR (RSV, Flu A&B, Covid) Anterior Nasal Swab     Status: None   Collection Time: 02/06/24  8:00 PM   Specimen: Anterior Nasal Swab  Result Value Ref Range Status   SARS Coronavirus 2 by RT PCR NEGATIVE NEGATIVE Final    Comment: (NOTE) SARS-CoV-2 target nucleic acids are NOT DETECTED.  The SARS-CoV-2 RNA is generally detectable in upper respiratory specimens during the acute phase of infection. The lowest concentration of SARS-CoV-2 viral copies this assay can detect is 138 copies/mL. A negative result does not preclude SARS-Cov-2 infection and should not be used as the sole basis for treatment or other patient management decisions. A negative result may occur with  improper specimen collection/handling, submission of specimen other than nasopharyngeal swab, presence of viral mutation(s) within the areas targeted by this assay, and inadequate number of viral copies(<138 copies/mL). A negative result must be combined with clinical observations, patient history, and epidemiological information. The expected result is Negative.  Fact Sheet for Patients:  BloggerCourse.com  Fact Sheet for Healthcare Providers:  SeriousBroker.it  This test is no t yet approved or cleared by the [place] FDA and  has been authorized for detection and/or diagnosis of SARS-CoV-2 by FDA under an Emergency Use Authorization (EUA). This EUA will remain  in effect (meaning this test can be used) for the  duration of the COVID-19 declaration under Section 564(b)(1) of the Act, 21 U.S.C.section 360bbb-3(b)(1), unless the authorization is terminated  or revoked sooner.       Influenza A by PCR NEGATIVE NEGATIVE Final   Influenza B by PCR NEGATIVE NEGATIVE Final    Comment: (NOTE) The Xpert Xpress SARS-CoV-2/FLU/RSV plus assay is intended as an aid in the diagnosis of influenza from Nasopharyngeal swab specimens and should not be used as a sole basis for treatment. Nasal washings and aspirates are unacceptable for Xpert Xpress SARS-CoV-2/FLU/RSV testing.  Fact Sheet for Patients: BloggerCourse.com  Fact Sheet for Healthcare Providers: SeriousBroker.it  This test is not yet approved or cleared by the [place] FDA and has been authorized for detection and/or diagnosis of SARS-CoV-2 by FDA under an Emergency Use Authorization (EUA). This EUA will remain in effect (meaning this test can be used) for the duration of the COVID-19 declaration under Section 564(b)(1) of the Act, 21 U.S.C. section 360bbb-3(b)(1), unless the authorization is terminated or revoked.     Resp Syncytial Virus by PCR NEGATIVE NEGATIVE Final    Comment: (NOTE) Fact Sheet for Patients: BloggerCourse.com  Fact Sheet for Healthcare Providers: SeriousBroker.it  This test is not yet approved or cleared by the [place] FDA and has been authorized for detection and/or diagnosis of SARS-CoV-2 by FDA under an Emergency Use Authorization (EUA). This EUA will remain in effect (meaning this test can be used) for the duration of the COVID-19 declaration under Section 564(b)(1) of the Act, 21 U.S.C. section 360bbb-3(b)(1), unless the authorization is terminated or revoked.  Performed at [facility], [address], [place], [place] [address]   Urine Culture     Status: Abnormal (Preliminary  result)   Collection Time: 02/06/24 11:35 PM   Specimen: Urine,  Random  Result Value Ref Range Status   Specimen Description   Final    URINE, RANDOM Performed at [facility] [address], [place], [place] [address]    Special Requests   Final    NONE Reflexed from [id number] Performed at [facility], [address], [place], [place] [address]    Culture (A)  Final    10,000 COLONIES/mL ESCHERICHIA COLI 70,000 COLONIES/mL ENTEROCOCCUS FAECALIS    Report Status PENDING  Incomplete   Organism ID, Bacteria ESCHERICHIA COLI (A)  Final   Organism ID, Bacteria ENTEROCOCCUS FAECALIS (A)  Final      Susceptibility   Escherichia coli - MIC*    AMPICILLIN <=2 SENSITIVE Sensitive     CEFAZOLIN <=4 SENSITIVE Sensitive     CEFEPIME <=0.12 SENSITIVE Sensitive     CEFTRIAXONE <=0.25 SENSITIVE Sensitive     CIPROFLOXACIN <=0.25 SENSITIVE Sensitive     GENTAMICIN <=1 SENSITIVE Sensitive     IMIPENEM <=0.25 SENSITIVE Sensitive     NITROFURANTOIN <=16 SENSITIVE Sensitive     TRIMETH/SULFA <=20 SENSITIVE Sensitive     AMPICILLIN/SULBACTAM <=2 SENSITIVE Sensitive     PIP/TAZO <=4 SENSITIVE Sensitive ug/mL    * 10,000 COLONIES/mL ESCHERICHIA COLI   Enterococcus faecalis - MIC*    AMPICILLIN <=2 SENSITIVE Sensitive     NITROFURANTOIN <=16 SENSITIVE Sensitive     VANCOMYCIN 1 SENSITIVE Sensitive     LINEZOLID Value in next row Sensitive      SENSITIVEMIC 2 S    * 70,000 COLONIES/mL ENTEROCOCCUS FAECALIS    Gardiner Barefoot, MD [facility] [email or web address] 02/09/2024, 1:14 PM

## 2024-02-10 ENCOUNTER — Other Ambulatory Visit (HOSPITAL_COMMUNITY): Payer: Self-pay

## 2024-02-10 DIAGNOSIS — A419 Sepsis, unspecified organism: Secondary | ICD-10-CM | POA: Diagnosis not present

## 2024-02-10 DIAGNOSIS — R6520 Severe sepsis without septic shock: Secondary | ICD-10-CM | POA: Diagnosis not present

## 2024-02-10 LAB — BASIC METABOLIC PANEL
Anion gap: 9 (ref 5–15)
BUN: 15 mg/dL (ref 6–20)
CO2: 23 mmol/L (ref 22–32)
Calcium: 8.8 mg/dL — ABNORMAL LOW (ref 8.9–10.3)
Chloride: 105 mmol/L (ref 98–111)
Creatinine, Ser: 1.05 mg/dL — ABNORMAL HIGH (ref 0.44–1.00)
GFR, Estimated: >60 mL/min (ref >60)
Glucose, Bld: 101 mg/dL — ABNORMAL HIGH (ref 70–99)
Potassium: 4 mmol/L (ref 3.5–5.1)
Sodium: 137 mmol/L (ref 135–145)

## 2024-02-10 LAB — CBC
HCT: 25.3 % — ABNORMAL LOW (ref 36.0–46.0)
Hemoglobin: 7.2 g/dL — ABNORMAL LOW (ref 12.0–15.0)
MCH: 21.6 pg — ABNORMAL LOW (ref 26.0–34.0)
MCHC: 28.5 g/dL — ABNORMAL LOW (ref 30.0–36.0)
MCV: 76 fL — ABNORMAL LOW (ref 80.0–100.0)
Platelets: 271 10*3/uL (ref 150–400)
RBC: 3.33 MIL/uL — ABNORMAL LOW (ref 3.87–5.11)
RDW: 21.4 % — ABNORMAL HIGH (ref 11.5–15.5)
WBC: 7.8 10*3/uL (ref 4.0–10.5)
nRBC: 0 % (ref 0.0–0.2)

## 2024-02-10 LAB — MAGNESIUM: Magnesium: 2.2 mg/dL (ref 1.7–2.4)

## 2024-02-10 MED ORDER — HYDROMORPHONE HCL 1 MG/ML IJ SOLN
0.5000 mg | Freq: Once | INTRAMUSCULAR | Status: AC
  Administered 2024-02-10: 0.5 mg via INTRAVENOUS
  Filled 2024-02-10: qty 0.5

## 2024-02-10 MED ORDER — LEVOFLOXACIN 500 MG PO TABS
500.0000 mg | ORAL_TABLET | Freq: Every day | ORAL | 0 refills | Status: AC
  Filled 2024-02-10 (×2): qty 3, 3d supply, fill #0

## 2024-02-10 NOTE — Progress Notes (Signed)
 Subjective: Continues to feel well with no abdominal symptoms.  ROS: See above, otherwise other systems negative  Objective: Vital signs in last 24 hours: Temp:  [98.1 F (36.7 C)-98.3 F (36.8 C)] 98.3 F (36.8 C) (03/15 0349) Pulse Rate:  [72-76] 74 (03/15 0349) Resp:  [18-20] 20 (03/15 0349) BP: (124-139)/(64-72) 124/64 (03/15 0349) SpO2:  [96 %] 96 % (03/15 0349)    Intake/Output from previous day: 03/14 0701 - 03/15 0700 In: 481.8 [P.O.:240; IV Piggyback:241.8] Out: -  Intake/Output this shift: No intake/output data recorded.  PE: Gen: NAD Abd: soft, NT, morbidly obese.  Lab Results:  Recent Labs    02/09/24 0330 02/10/24 0342  WBC 10.4 7.8  HGB 7.6* 7.2*  HCT 27.9* 25.3*  PLT 259 271   BMET Recent Labs    02/09/24 0330 02/10/24 0342  NA 135 137  K 3.2* 4.0  CL 102 105  CO2 23 23  GLUCOSE 104* 101*  BUN 17 15  CREATININE 1.18* 1.05*  CALCIUM 8.8* 8.8*   PT/INR Recent Labs    02/07/24 1645  LABPROT 15.9*  INR 1.3*   CMP     Component Value Date/Time   NA 137 02/10/2024 0342   NA 142 11/13/2023 1325   K 4.0 02/10/2024 0342   CL 105 02/10/2024 0342   CO2 23 02/10/2024 0342   GLUCOSE 101 (H) 02/10/2024 0342   BUN 15 02/10/2024 0342   BUN 20 11/13/2023 1325   CREATININE 1.05 (H) 02/10/2024 0342   CREATININE 1.06 11/14/2016 1050   CALCIUM 8.8 (L) 02/10/2024 0342   CALCIUM 9.6 06/26/2007 0000   PROT 6.1 (L) 02/08/2024 0343   PROT 6.6 11/13/2023 1325   ALBUMIN 2.8 (L) 02/08/2024 0343   ALBUMIN 3.7 (L) 11/13/2023 1325   AST 11 (L) 02/08/2024 0343   ALT 10 02/08/2024 0343   ALKPHOS 70 02/08/2024 0343   BILITOT 0.7 02/08/2024 0343   BILITOT <0.2 11/13/2023 1325   GFRNONAA >60 02/10/2024 0342   GFRAA 81 11/11/2020 1211   Lipase     Component Value Date/Time   LIPASE 48.0 09/23/2022 1134       Studies/Results: No results found.   Anti-infectives: Anti-infectives (From admission, onward)    Start     Dose/Rate  Route Frequency Ordered Stop   02/09/24 1300  levofloxacin (LEVAQUIN) IVPB 750 mg        750 mg 100 mL/hr over 90 Minutes Intravenous Every 24 hours 02/09/24 1149     02/09/24 1000  Ampicillin-Sulbactam (UNASYN) 3 g in sodium chloride 0.9 % 100 mL IVPB  Status:  Discontinued        3 g 200 mL/hr over 30 Minutes Intravenous Every 6 hours 02/09/24 0842 02/09/24 1148   02/07/24 2200  vancomycin (VANCOREADY) IVPB 1750 mg/350 mL  Status:  Discontinued        1,750 mg 175 mL/hr over 120 Minutes Intravenous Every 24 hours 02/07/24 0026 02/07/24 1116   02/07/24 2000  cefTRIAXone (ROCEPHIN) 2 g in sodium chloride 0.9 % 100 mL IVPB  Status:  Discontinued        2 g 200 mL/hr over 30 Minutes Intravenous Every 24 hours 02/07/24 1116 02/09/24 0842   02/07/24 1730  ceFEPIme (MAXIPIME) 2 g in sodium chloride 0.9 % 100 mL IVPB  Status:  Discontinued        2 g 200 mL/hr over 30 Minutes Intravenous  Once 02/07/24 1634 02/07/24 1640   02/07/24 1730  metroNIDAZOLE (FLAGYL) IVPB 500 mg  Status:  Discontinued        500 mg 100 mL/hr over 60 Minutes Intravenous Every 12 hours 02/07/24 1634 02/07/24 1640   02/07/24 1730  vancomycin (VANCOCIN) IVPB 1000 mg/200 mL premix  Status:  Discontinued        1,000 mg 200 mL/hr over 60 Minutes Intravenous  Once 02/07/24 1634 02/07/24 1640   02/07/24 1200  metroNIDAZOLE (FLAGYL) IVPB 500 mg  Status:  Discontinued        500 mg 100 mL/hr over 60 Minutes Intravenous Every 12 hours 02/07/24 1116 02/09/24 0842   02/07/24 0600  ceFEPIme (MAXIPIME) 2 g in sodium chloride 0.9 % 100 mL IVPB  Status:  Discontinued        2 g 200 mL/hr over 30 Minutes Intravenous Every 8 hours 02/07/24 0026 02/07/24 1116   02/07/24 0030  vancomycin (VANCOREADY) IVPB 2000 mg/400 mL        2,000 mg 200 mL/hr over 120 Minutes Intravenous  Once 02/07/24 0024 02/07/24 0359   02/06/24 2145  metroNIDAZOLE (FLAGYL) IVPB 500 mg        500 mg 100 mL/hr over 60 Minutes Intravenous  Once 02/06/24 2133  02/06/24 2250   02/06/24 1930  cefTRIAXone (ROCEPHIN) 2 g in sodium chloride 0.9 % 100 mL IVPB        2 g 200 mL/hr over 30 Minutes Intravenous Once 02/06/24 1923 02/06/24 2117   02/06/24 1930  azithromycin (ZITHROMAX) 500 mg in sodium chloride 0.9 % 250 mL IVPB        500 mg 250 mL/hr over 60 Minutes Intravenous  Once 02/06/24 1923 02/06/24 2117        Assessment/Plan Sigmoid diverticulitis with CV fistula, complicated by possible pyelonephritis -Tolerating soft diet, no abdominal symptoms -cont abx therapy for mild diverticulitis on scan, but also for UTI/pyelonephritis.  Urine cx in process.  White count has remained normal and afebrile.   -patient would like to continue avoiding surgery and get back to [facility] at outpatient. -tx for anemia per primary team.  Due for iron infusion next week.  Appropriate response to transfusion 3/13.  FEN - soft, IVFs per primary  VTE - Lovenox ID - Rocephin/Flagyl  Anemia - tx per primary Morbid obesity - planning bypass at [facility] DM HTN Hypothyroidism  I reviewed nursing notes, hospitalist notes, last 24 h vitals and pain scores, last 48 h intake and output, last 24 h labs and trends, and last 24 h imaging results.  From surgery standpoint okay to transition to oral antibiotics and discharge; follow-up with her colorectal surgeon at [facility].   LOS: 3 days    Berna Bue , MD [facility] 02/10/2024, 8:23 AM Please see Amion for pager number during day hours 7:00am-4:30pm or 7:00am -11:30am on weekends

## 2024-02-10 NOTE — Discharge Summary (Signed)
 KAMALEI Beck EXB:[id number] DOB: [birth year] DOA: 02/06/2024  PCP: Corwin Levins, MD  Admit date: 02/06/2024  Discharge date: 02/10/2024  Admitted From: Home disposition: Home   Recommendations for Outpatient Follow-up:   Follow up with PCP in 1 week  PCP Please obtain BMP/CBC and check blood pressure in 1week,  (see Discharge instructions)   PCP Please follow up on the following pending results: Patient has been discharged off of all of her BP medications as she has been normotensive off of these medications in the hospital.  Anticipate blood pressure will rise once infection is adequately controlled and she resumes normal eating patterns.  Patient's blood pressure will need to be followed as an outpatient so that you can advise her of when and how to restart her medications as appropriate.   Home Health: N/A Equipment/Devices: Nothing new Consultations: Infectious disease, general surgery Discharge Condition: Improved CODE STATUS: Full Diet Recommendation: Low calorie or as tolerated  Diet Order             DIET SOFT Fluid consistency: Thin  Diet effective now                    Chief Complaint  Patient presents with   Shortness of Breath     Brief history of present illness from the day of admission and additional interim summary    Alexandra Beck is a 43 y.o. female with medical history significant of non-insulin-dependent diabetes, chronic diastolic heart failure, obesity hypoventilation syndrome, obstructive sleep apnea, depression, GERD, essential hypertension, hypothyroidism, hyperlipidemia, anemia of chronic disease, recurrent diverticulitis who presented to the ER from home complaining of fever and chills that started this afternoon.  Patient was also hypoxic with oxygen sat 88% on room air.  She  was placed on 3 L and brought to the ER.  She complained of generalized weakness.  Patient was also having some abdominal pain.  Patient has had Tylenol at home but still fever has persisted.  Denied any sore throat no cough no respiratory symptoms.  In the ER patient was evaluated and found to have met sepsis criteria.  She also has severe sepsis due to hypotension, initial hypoxemia as well as lactate of 4.  Further testing shows that patient has residual diverticulitis with diverticular abscess and findings worrisome for colovesical fistula which patient knows about since November last year.  She is following up with a general surgeon at [facility] with surgery postponed due to her weight and body habitus..  At this point she has been admitted with severe sepsis which could be multifactorial from diverticular abscess, UTI or even early pneumonia.  Hospital Course   She has a history of a colonic vesicular fistula and is followed at [facility] for potential surgical intervention at some point. She is first undergoing evaluation for bariatric surgery to optimize her for any other surgery. She has had issues with recurrent infections and diverticulitis. Upon presentation this time, she did not have any particular symptoms other than the high fever including no new abdominal pain, no new diarrhea, no dysuria. He was started on empiric antibiotics and has improved.  Patient was noted not to be a surgical candidate to repair colovesicular fistula due to body habitus.  As noted above she is undergoing evaluation for bariatric surgery.  Patient was seen by infectious disease who note clinical improvement with antibiotics.  She is recommended to go home to complete antibiotic course with 3 more days of Levaquin.  Complicated diverticulitis with abscess Colovesical fistula Acute pyelonephritis E. coli bacteremia Severe sepsis--resolved Patient is  symptomatically responded well to cefepime and vancomycin Today she is growing out E faecalis out of urine, recommendation is to change to IV levofloxacin which was done Appreciate ID consultation, will plan to discharge home on Levaquin 500 p.o. daily for 3 more days There is no recommendation for prophylactic antibiotics due to development of resistance Further management of colovesicular fistula per [facility] colorectal surgeon after bariatric surgery   Normocytic anemia ACD Patient was transfused 1 unit PRBC for hemoglobin 6.8, with appropriate response No evidence for active bleeding Further management of anemia per her PCP   OSA Obesity hypoventilation syndrome Morbid obesity Patient declined CPAP Weight management with bariatric surgery is planned   HFpEF Euvolemic   DM 2 Sugars under good control on present regimen   HTN BP is well-controlled off of antihypertensives today Patient will need to follow-up with PCP shortly after discharge to restart BP meds when warranted  Discharge diagnosis     Principal Problem:   Severe sepsis (HCC) Active Problems:   Diverticulitis of large intestine with abscess   Chronic diastolic CHF (congestive heart failure) (HCC)   Type 2 diabetes mellitus with obesity (HCC)   AKI (acute kidney injury) (HCC)   OSA (obstructive sleep apnea)   Obesity hypoventilation syndrome (HCC)   Hypokalemia   Hypertension associated with diabetes (HCC)   Hypothyroidism   GERD (gastroesophageal reflux disease)   Depression   Morbid obesity (HCC)   UTI (urinary tract infection)    Discharge instructions    Discharge Instructions     Call MD for:   Complete by: As directed    If symptoms recur after you have completed your course of antibiotics.   Call MD for:  persistant nausea and vomiting   Complete by: As directed    Discharge instructions   Complete by: As directed    1.  As you discussed with Dr. Luciana Axe, you are being prescribed levofloxacin  500 mg daily to take for the next 3 days. 2.  Please make an appointment to see your PCP within the week after discharge.  You are being discharged off of all your blood pressure medications as your blood pressure has been normal in the hospital off of these medications.  This is likely related to your infection.  Your PCP will need to follow your blood pressure as your infection heals and will tell you when and how to restart your blood pressure medications depending on BP at the time.   Increase activity slowly   Complete by: As directed  Discharge Medications   Allergies as of 02/10/2024       Reactions   Aspirin Hives, Swelling, Other (See Comments)   REACTION: throat swelling, hives Other reaction(s): Unknown REACTION: throat swelling, hives Other reaction(s): Unknown Other reaction(s): Unknown   Doxycycline Other (See Comments)   Abdominal pain, difficulty swallowing   Lisinopril Cough   Niacin    Other reaction(s): Unknown Other reaction(s): Unknown   Niacin And Related    Other reaction(s): Unknown Other reaction(s): Unknown   Niaspan [niacin Er (antihyperlipidemic)]    Caused flushing   Wound Dressing Adhesive Itching   Welts and burning of the skin   Wound Dressings Itching   Welts and burning of the skin        Medication List     PAUSE taking these medications    losartan 100 MG tablet Wait to take this until your doctor or other care provider tells you to start again. Commonly known as: COZAAR Take 1 tablet (100 mg total) by mouth daily.   metoprolol succinate 100 MG 24 hr tablet Wait to take this until your doctor or other care provider tells you to start again. Commonly known as: TOPROL-XL Take 100 mg by mouth daily. Take with or immediately following a meal.   spironolactone 25 MG tablet Wait to take this until your doctor or other care provider tells you to start again. Commonly known as: ALDACTONE Take 1 tablet (25 mg total) by mouth  daily.   torsemide 20 MG tablet Wait to take this until your doctor or other care provider tells you to start again. Commonly known as: DEMADEX Take 4 tablets (80 mg total) by mouth 2 (two) times daily as needed.       STOP taking these medications    cefadroxil 500 MG capsule Commonly known as: DURICEF       TAKE these medications    lamoTRIgine 200 MG tablet Commonly known as: LaMICtal Take 1 tablet (200 mg total) by mouth daily.   levofloxacin 500 MG tablet Commonly known as: LEVAQUIN Take 1 tablet (500 mg total) by mouth daily for 3 days.   levothyroxine 150 MCG tablet Commonly known as: SYNTHROID Take 1 tablet (150 mcg total) by mouth daily before breakfast. Must keep scheduled appt for future refills   metFORMIN 500 MG tablet Commonly known as: GLUCOPHAGE Take 1 tablet (500 mg total) by mouth daily. Must keep scheduled appt for future refills   modafinil 100 MG tablet Commonly known as: PROVIGIL Take 1 tablet (100 mg total) by mouth daily. Take daily at 1230 with a 200mg  tablet.   modafinil 200 MG tablet Commonly known as: PROVIGIL Take 1 tablet (200 mg total) by mouth daily. Take 1 tablet by mouth daily at 1230 with a 100mg  tablet   Ozempic (2 MG/DOSE) 8 MG/3ML Sopn Generic drug: Semaglutide (2 MG/DOSE) Inject 2 mg into the skin as directed once a week.   rosuvastatin 20 MG tablet Commonly known as: Crestor Take 1 tablet (20 mg total) by mouth daily.   sertraline 100 MG tablet Commonly known as: ZOLOFT Take 2 tablets (200 mg total) by mouth daily.   triamcinolone cream 0.1 % Commonly known as: KENALOG Apply 1 Application topically 2 (two) times daily. What changed:  when to take this reasons to take this   Vitamin D (Ergocalciferol) 1.25 MG (50000 UNIT) Caps capsule Commonly known as: DRISDOL Take 1 capsule (50,000 Units total) by mouth every 7 (seven) days.  Major procedures and Radiology Reports - PLEASE review detailed and  final reports thoroughly  -       CT ABDOMEN PELVIS W CONTRAST Result Date: 02/06/2024 CLINICAL DATA:  Sepsis, fever, chills EXAM: CT ABDOMEN AND PELVIS WITH CONTRAST TECHNIQUE: Multidetector CT imaging of the abdomen and pelvis was performed using the standard protocol following bolus administration of intravenous contrast. RADIATION DOSE REDUCTION: This exam was performed according to the departmental dose-optimization program which includes automated exposure control, adjustment of the mA and/or kV according to patient size and/or use of iterative reconstruction technique. CONTRAST:  80mL OMNIPAQUE IOHEXOL 300 MG/ML  SOLN COMPARISON:  None Available. FINDINGS: Lower chest: No acute abnormality. Hepatobiliary: Cholelithiasis without superimposed pericholecystic inflammatory change. Mildly progressive, moderate hepatomegaly. Liver otherwise unremarkable; no enhancing intrahepatic mass identified. No intra or extrahepatic biliary ductal dilation. Pancreas: Unremarkable. No pancreatic ductal dilatation or surrounding inflammatory changes. Spleen: Moderate splenomegaly with the spleen measuring 15.9 cm in greatest dimension, slightly enlarged since prior examination. Adrenals/Urinary Tract: The adrenal glands are unremarkable. The kidneys are normal in size and position. Since the prior examination there has developed mild perinephric inflammatory stranding suggesting an underlying inflammatory process such as pyelonephritis. No hydronephrosis. No intrarenal or ureteral calculi. There is server frontal bladder wall thickening in keeping with an infectious or inflammatory cystitis. Additionally, there has developed punctate intraluminal gas within the bladder lumen non dependently. Inflammatory gas and fluid collection related to perforated diverticulitis is again seen abutting the anterior wall of the bladder, best seen on image # 74/3. Together, a colovesicular fistula is not excluded. Stomach/Bowel: Moderate  pancolonic diverticulosis noted. There are inflammatory changes involving the mid sigmoid colon similar prior examination though less severe reflecting recurrent mild or residual diverticulitis. No, however, that an infiltrative mass could appear similarly, particularly given its persistence. A a loculated gas and fluid collection is again seen extending from the inflamed portion: 2 anterior bladder were there is focal bladder wall thickening in keeping with a residual diverticular abscess and possible colovesicular fistula. The stomach, small bowel, and large bowel are otherwise unremarkable. Appendix normal. No free intraperitoneal gas or fluid. Vascular/Lymphatic: No significant vascular findings are present. No enlarged abdominal or pelvic lymph nodes. Reproductive: A complex multiloculated cystic lesion is seen involving the left adnexa, not well characterized on this examination but better delineated since prior exam with improvement in surrounding inflammatory change. The pelvic organs are otherwise unremarkable. Other: Mild diffuse subcutaneous body wall edema. Musculoskeletal: No acute or significant osseous findings. IMPRESSION: 1. Moderate pancolonic diverticulosis. Recurrent mild or residual diverticulitis involving the mid sigmoid colon similar prior examination though less severe reflecting recurrent mild or residual diverticulitis. Endoscopy should be considered, however, to exclude underlying infiltrative mass. 2. Residual diverticular abscess extending from the inflamed portion of the sigmoid colon to the anterior bladder wall where there is focal bladder wall thickening in keeping with an infectious or inflammatory cystitis. Additionally, there has developed punctate intraluminal gas within the bladder lumen non dependently. Together, a colovesicular fistula is not excluded. 3. Mild perinephric inflammatory stranding suggesting an underlying inflammatory process such as pyelonephritis. Correlation  with urinalysis and urine culture may be helpful. 4. Complex multiloculated cystic lesion involving the left adnexa, not well characterized on this examination but better delineated since prior exam with improvement in surrounding inflammatory change. This could be better assessed with dedicated pelvic sonography once the patient's acute issues have resolved 5. Cholelithiasis. 6. Mildly progressive, moderate hepatosplenomegaly. Electronically Signed   By: Helyn Numbers  M.D.   On: 02/06/2024 23:16   DG Chest Port 1 View Result Date: 02/06/2024 CLINICAL DATA:  Cough and fever. EXAM: PORTABLE CHEST 1 VIEW COMPARISON:  None Available. FINDINGS: The heart is upper normal in size. The cardiomediastinal contours are normal. Subsegmental atelectasis in the right lower lung zone. Pulmonary vasculature is normal. No consolidation, pleural effusion, or pneumothorax. No acute osseous abnormalities are seen. IMPRESSION: Subsegmental atelectasis in the right lower lung zone. Electronically Signed   By: Narda Rutherford M.D.   On: 02/06/2024 21:49   DG UGI W SINGLE CM (SOL OR THIN BA) Result Date: 01/16/2024 CLINICAL DATA:  Erosive gastropathy.  Preop for bariatric surgery. EXAM: UPPER GI SERIES WITH KUB TECHNIQUE: After obtaining a scout radiograph a routine upper GI series was performed using thin density barium. FLUOROSCOPY: Radiation Exposure Index (as provided by the fluoroscopic device): 137 mGy Kerma COMPARISON:  None Available. FINDINGS: No definite evidence mass or stricture is noted in the esophagus. Small sliding-type hiatal hernia is noted. No reflux is noted. Stomach is grossly unremarkable. Contrast flowed easily into the duodenum. Duodenal bulb and sweep are unremarkable. IMPRESSION: Small sliding-type hiatal hernia. No other abnormality seen in the esophagus, stomach or duodenum on this single contrast study. Electronically Signed   By: Lupita Raider M.D.   On: 01/16/2024 10:50    Micro Results     Recent Results (from the past 240 hours)  Blood Culture (routine x 2)     Status: Abnormal   Collection Time: 02/06/24  7:33 PM   Specimen: BLOOD  Result Value Ref Range Status   Specimen Description   Final    BLOOD Performed at [facility], [address], [place], [place] [address]    Special Requests   Final    BOTTLES DRAWN AEROBIC AND ANAEROBIC Blood Culture adequate volume Performed at [facility], [address] [place], [place] [address]    Culture  Setup Time   Final    GRAM NEGATIVE RODS IN BOTH AEROBIC AND ANAEROBIC BOTTLES CRITICAL RESULT CALLED TO, READ BACK BY AND VERIFIED WITH: PHARMD T GREEN [id number] AT 1040 BY CM Performed at [facility], [address], [place], [place] [address]    Culture ESCHERICHIA COLI (A)  Final   Report Status 02/09/2024 FINAL  Final   Organism ID, Bacteria ESCHERICHIA COLI  Final   Organism ID, Bacteria ESCHERICHIA COLI  Final      Susceptibility   Escherichia coli - KIRBY BAUER*    CEFAZOLIN INTERMEDIATE Intermediate    Escherichia coli - MIC*    AMPICILLIN >=32 RESISTANT Resistant     CEFEPIME <=0.12 SENSITIVE Sensitive     CEFTAZIDIME <=1 SENSITIVE Sensitive     CEFTRIAXONE <=0.25 SENSITIVE Sensitive     CIPROFLOXACIN <=0.25 SENSITIVE Sensitive     GENTAMICIN >=16 RESISTANT Resistant     IMIPENEM <=0.25 SENSITIVE Sensitive     TRIMETH/SULFA <=20 SENSITIVE Sensitive     AMPICILLIN/SULBACTAM 16 INTERMEDIATE Intermediate     PIP/TAZO <=4 SENSITIVE Sensitive ug/mL    * ESCHERICHIA COLI    ESCHERICHIA COLI  Blood Culture ID Panel (Reflexed)     Status: Abnormal   Collection Time: 02/06/24  7:33 PM  Result Value Ref Range Status   Enterococcus faecalis NOT DETECTED NOT DETECTED Final   Enterococcus Faecium NOT DETECTED NOT DETECTED Final   Listeria monocytogenes NOT DETECTED NOT DETECTED Final   Staphylococcus species NOT DETECTED NOT DETECTED Final  Staphylococcus aureus (BCID)  NOT DETECTED NOT DETECTED Final   Staphylococcus epidermidis NOT DETECTED NOT DETECTED Final   Staphylococcus lugdunensis NOT DETECTED NOT DETECTED Final   Streptococcus species NOT DETECTED NOT DETECTED Final   Streptococcus agalactiae NOT DETECTED NOT DETECTED Final   Streptococcus pneumoniae NOT DETECTED NOT DETECTED Final   Streptococcus pyogenes NOT DETECTED NOT DETECTED Final   A.calcoaceticus-baumannii NOT DETECTED NOT DETECTED Final   Bacteroides fragilis NOT DETECTED NOT DETECTED Final   Enterobacterales DETECTED (A) NOT DETECTED Final    Comment: Enterobacterales represent a large order of gram negative bacteria, not a single organism. CRITICAL RESULT CALLED TO, READ BACK BY AND VERIFIED WITH: PHARMD T GREEN [id number] AT 1040 BY CM    Enterobacter cloacae complex NOT DETECTED NOT DETECTED Final   Escherichia coli DETECTED (A) NOT DETECTED Final    Comment: CRITICAL RESULT CALLED TO, READ BACK BY AND VERIFIED WITH: PHARMD T GREEN [id number] AT 1040 BY CM    Klebsiella aerogenes NOT DETECTED NOT DETECTED Final   Klebsiella oxytoca NOT DETECTED NOT DETECTED Final   Klebsiella pneumoniae NOT DETECTED NOT DETECTED Final   Proteus species NOT DETECTED NOT DETECTED Final   Salmonella species NOT DETECTED NOT DETECTED Final   Serratia marcescens NOT DETECTED NOT DETECTED Final   Haemophilus influenzae NOT DETECTED NOT DETECTED Final   Neisseria meningitidis NOT DETECTED NOT DETECTED Final   Pseudomonas aeruginosa NOT DETECTED NOT DETECTED Final   Stenotrophomonas maltophilia NOT DETECTED NOT DETECTED Final   Candida albicans NOT DETECTED NOT DETECTED Final   Candida auris NOT DETECTED NOT DETECTED Final   Candida glabrata NOT DETECTED NOT DETECTED Final   Candida krusei NOT DETECTED NOT DETECTED Final   Candida parapsilosis NOT DETECTED NOT DETECTED Final   Candida tropicalis NOT DETECTED NOT DETECTED Final   Cryptococcus neoformans/gattii NOT DETECTED NOT DETECTED Final   CTX-M ESBL  NOT DETECTED NOT DETECTED Final   Carbapenem resistance IMP NOT DETECTED NOT DETECTED Final   Carbapenem resistance KPC NOT DETECTED NOT DETECTED Final   Carbapenem resistance NDM NOT DETECTED NOT DETECTED Final   Carbapenem resist OXA 48 LIKE NOT DETECTED NOT DETECTED Final   Carbapenem resistance VIM NOT DETECTED NOT DETECTED Final    Comment: Performed at [facility], [address], [place], [place] [address]  Resp panel by RT-PCR (RSV, Flu A&B, Covid) Anterior Nasal Swab     Status: None   Collection Time: 02/06/24  8:00 PM   Specimen: Anterior Nasal Swab  Result Value Ref Range Status   SARS Coronavirus 2 by RT PCR NEGATIVE NEGATIVE Final    Comment: (NOTE) SARS-CoV-2 target nucleic acids are NOT DETECTED.  The SARS-CoV-2 RNA is generally detectable in upper respiratory specimens during the acute phase of infection. The lowest concentration of SARS-CoV-2 viral copies this assay can detect is 138 copies/mL. A negative result does not preclude SARS-Cov-2 infection and should not be used as the sole basis for treatment or other patient management decisions. A negative result may occur with  improper specimen collection/handling, submission of specimen other than nasopharyngeal swab, presence of viral mutation(s) within the areas targeted by this assay, and inadequate number of viral copies(<138 copies/mL). A negative result must be combined with clinical observations, patient history, and epidemiological information. The expected result is Negative.  Fact Sheet for Patients:  BloggerCourse.com  Fact Sheet for Healthcare Providers:  SeriousBroker.it  This test is no t yet approved or cleared by the [place] and  has been authorized for detection and/or diagnosis of SARS-CoV-2 by FDA under an Emergency Use Authorization (EUA). This EUA will remain  in effect (meaning this test can be used) for the duration of  the COVID-19 declaration under Section 564(b)(1) of the Act, 21 U.S.C.section 360bbb-3(b)(1), unless the authorization is terminated  or revoked sooner.       Influenza A by PCR NEGATIVE NEGATIVE Final   Influenza B by PCR NEGATIVE NEGATIVE Final    Comment: (NOTE) The Xpert Xpress SARS-CoV-2/FLU/RSV plus assay is intended as an aid in the diagnosis of influenza from Nasopharyngeal swab specimens and should not be used as a sole basis for treatment. Nasal washings and aspirates are unacceptable for Xpert Xpress SARS-CoV-2/FLU/RSV testing.  Fact Sheet for Patients: BloggerCourse.com  Fact Sheet for Healthcare Providers: SeriousBroker.it  This test is not yet approved or cleared by the [place] FDA and has been authorized for detection and/or diagnosis of SARS-CoV-2 by FDA under an Emergency Use Authorization (EUA). This EUA will remain in effect (meaning this test can be used) for the duration of the COVID-19 declaration under Section 564(b)(1) of the Act, 21 U.S.C. section 360bbb-3(b)(1), unless the authorization is terminated or revoked.     Resp Syncytial Virus by PCR NEGATIVE NEGATIVE Final    Comment: (NOTE) Fact Sheet for Patients: BloggerCourse.com  Fact Sheet for Healthcare Providers: SeriousBroker.it  This test is not yet approved or cleared by the [place] FDA and has been authorized for detection and/or diagnosis of SARS-CoV-2 by FDA under an Emergency Use Authorization (EUA). This EUA will remain in effect (meaning this test can be used) for the duration of the COVID-19 declaration under Section 564(b)(1) of the Act, 21 U.S.C. section 360bbb-3(b)(1), unless the authorization is terminated or revoked.  Performed at [facility], [address] [place], [place] [address]   Urine Culture     Status: Abnormal (Preliminary result)    Collection Time: 02/06/24 11:35 PM   Specimen: Urine, Random  Result Value Ref Range Status   Specimen Description   Final    URINE, RANDOM Performed at [facility], [address], [place], [place] [address]    Special Requests   Final    NONE Reflexed from [id number] Performed at [facility], [address], [place], [place] [address]    Culture (A)  Final    10,000 COLONIES/mL ESCHERICHIA COLI 70,000 COLONIES/mL ENTEROCOCCUS FAECALIS    Report Status PENDING  Incomplete   Organism ID, Bacteria ESCHERICHIA COLI (A)  Final   Organism ID, Bacteria ENTEROCOCCUS FAECALIS (A)  Final      Susceptibility   Escherichia coli - MIC*    AMPICILLIN <=2 SENSITIVE Sensitive     CEFAZOLIN <=4 SENSITIVE Sensitive     CEFEPIME <=0.12 SENSITIVE Sensitive     CEFTRIAXONE <=0.25 SENSITIVE Sensitive     CIPROFLOXACIN <=0.25 SENSITIVE Sensitive     GENTAMICIN <=1 SENSITIVE Sensitive     IMIPENEM <=0.25 SENSITIVE Sensitive     NITROFURANTOIN <=16 SENSITIVE Sensitive     TRIMETH/SULFA <=20 SENSITIVE Sensitive     AMPICILLIN/SULBACTAM <=2 SENSITIVE Sensitive     PIP/TAZO <=4 SENSITIVE Sensitive ug/mL    * 10,000 COLONIES/mL ESCHERICHIA COLI   Enterococcus faecalis - MIC*    AMPICILLIN <=2 SENSITIVE Sensitive     NITROFURANTOIN <=16 SENSITIVE Sensitive     VANCOMYCIN 1 SENSITIVE Sensitive     LINEZOLID Value in next row Sensitive      SENSITIVEMIC  2 S    * 70,000 COLONIES/mL ENTEROCOCCUS FAECALIS    Today   Subjective    Alexandra Beck feels much improved since admission.  She is upset that she was not discharged yesterday.  Is eager to go home today.  Denies chest pain, shortness of breath or abdominal pain.   Objective   Blood pressure 124/64, pulse 74, temperature 98.3 F (36.8 C), temperature source Oral, resp. rate 20, height 5\' 3"  (1.6 m), weight (!) 174.5 kg, SpO2 96%.   Intake/Output Summary (Last 24 hours) at 02/10/2024 1200 Last data filed at  02/09/2024 1700 Gross per 24 hour  Intake 481.83 ml  Output --  Net 481.83 ml    Exam General: Patient with morbid obesity sitting up easily in bed in NAD CVS: S1-S2, regular  Respiratory:  decreased air entry bilaterally secondary to decreased inspiratory effort, rales at bases  GI: Obese, positive BS, soft LE: No edema.  Neuro: A/O x 3, Moving all extremities equally with normal strength, CN 3-12 intact, grossly nonfocal.    Data Review   CBC w Diff:  Lab Results  Component Value Date   WBC 7.8 02/10/2024   HGB 7.2 (L) 02/10/2024   HGB 8.7 (L) 10/13/2023   HCT 25.3 (L) 02/10/2024   HCT 27.4 (L) 11/13/2023   PLT 271 02/10/2024   PLT 316 10/13/2023   LYMPHOPCT 2 02/06/2024   MONOPCT 1 02/06/2024   EOSPCT 0 02/06/2024   BASOPCT 0 02/06/2024    CMP:  Lab Results  Component Value Date   NA 137 02/10/2024   NA 142 11/13/2023   K 4.0 02/10/2024   CL 105 02/10/2024   CO2 23 02/10/2024   BUN 15 02/10/2024   BUN 20 11/13/2023   CREATININE 1.05 (H) 02/10/2024   CREATININE 1.06 11/14/2016   PROT 6.1 (L) 02/08/2024   PROT 6.6 11/13/2023   ALBUMIN 2.8 (L) 02/08/2024   ALBUMIN 3.7 (L) 11/13/2023   BILITOT 0.7 02/08/2024   BILITOT <0.2 11/13/2023   ALKPHOS 70 02/08/2024   AST 11 (L) 02/08/2024   ALT 10 02/08/2024  .   Total Time in preparing paper work, data evaluation and todays exam - 35 minutes  Pieter Partridge M.D on 02/10/2024 at 12:00 PM  [facility]

## 2024-02-11 LAB — URINE CULTURE: Culture: 10000 — AB

## 2024-02-12 ENCOUNTER — Inpatient Hospital Stay

## 2024-02-12 ENCOUNTER — Telehealth: Payer: Self-pay | Admitting: *Deleted

## 2024-02-12 VITALS — BP 133/66 | HR 67 | Temp 98.4°F | Resp 16 | Wt 387.8 lb

## 2024-02-12 DIAGNOSIS — E119 Type 2 diabetes mellitus without complications: Secondary | ICD-10-CM | POA: Diagnosis not present

## 2024-02-12 DIAGNOSIS — G47419 Narcolepsy without cataplexy: Secondary | ICD-10-CM | POA: Diagnosis not present

## 2024-02-12 DIAGNOSIS — E039 Hypothyroidism, unspecified: Secondary | ICD-10-CM | POA: Diagnosis not present

## 2024-02-12 DIAGNOSIS — D509 Iron deficiency anemia, unspecified: Secondary | ICD-10-CM

## 2024-02-12 DIAGNOSIS — I5032 Chronic diastolic (congestive) heart failure: Secondary | ICD-10-CM | POA: Diagnosis not present

## 2024-02-12 DIAGNOSIS — I110 Hypertensive heart disease with heart failure: Secondary | ICD-10-CM | POA: Diagnosis not present

## 2024-02-12 MED ORDER — SODIUM CHLORIDE 0.9 % IV SOLN
300.0000 mg | Freq: Once | INTRAVENOUS | Status: AC
  Administered 2024-02-12: 300 mg via INTRAVENOUS
  Filled 2024-02-12: qty 300

## 2024-02-12 MED ORDER — SODIUM CHLORIDE 0.9 % IV SOLN: INTRAVENOUS | Status: DC

## 2024-02-12 NOTE — Patient Instructions (Signed)

## 2024-02-12 NOTE — Transitions of Care (Post Inpatient/ED Visit) (Signed)
 02/12/2024  Name: Alexandra Beck MRN: [id number] DOB: Jan 31, 1981  Today's TOC FU Call Status: Today's TOC FU Call Status:: Successful TOC FU Call Completed TOC FU Call Complete Date: 02/12/24 Patient's Name and Date of Birth confirmed.  Transition Care Management Follow-up Telephone Call Date of Discharge: 02/10/24 Discharge Facility: [facility]) Type of Discharge: Inpatient Admission Primary Inpatient Discharge Diagnosis:: Sepsis- fever/ chills; hypoxia/ anemia; secondary to diverticulitis How have you been since you were released from the hospital?: Better ("I am much better now; pretty much back to my normal self; my wife is helping me if I need anything, but I am independent and able to do everything myself.  I will talk to my regular doctors about these medication recommendations, not changing anything") Any questions or concerns?: Yes Patient Questions/Concerns:: "They paused a lot of my very important medications when they released me from the hospital.  I am NOT pausing or stopping those medications-- I gained 13 pounds in fluid weight while I was in the hospital and my regular doctor's can decide if anything needs to change" Patient Questions/Concerns Addressed: Notified Provider of Patient Questions/Concerns, Other: (Full medication review; updated medication list according to patient report; made PCP aware that patient has independently chosen not to pause recommended medications as per discharge notes/ instructions per hospitalist MD)  Items Reviewed: Did you receive and understand the discharge instructions provided?: Yes (thoroughly reviewed with patient who verbalizes good understanding of same) Medications obtained,verified, and reconciled?: Yes (Medications Reviewed) (Full medication reconciliation/ review completed; confirmed patient obtained/ is taking all newly Rx'd medications as instructed; self-manages medications and reports she is not pauing reccomended meds; denies  questions/ concerns around medications today) Any new allergies since your discharge?: No Dietary orders reviewed?: Yes Type of Diet Ordered:: "High Protein, on healthy meal plan with the doctors at [facility]" Do you have support at home?: Yes People in Home: spouse Name of Support/Comfort Primary Source: Reports independent in self-care activities; supportive spouse assists as/ if needed/ indicated  Medications Reviewed Today: Medications Reviewed Today     Reviewed by Michaela Corner, RN (Registered Nurse) on 02/12/24 at 1346  Med List Status: <None>   Medication Order Taking? Sig Documenting Provider Last Dose Status Informant  lamoTRIgine (LAMICTAL) 200 MG tablet [id number] Yes Take 1 tablet (200 mg total) by mouth daily. Mozingo, Thereasa Solo, NP Taking Active   levofloxacin (LEVAQUIN) 500 MG tablet [id number] Yes Take 1 tablet (500 mg total) by mouth daily for 3 days. Pieter Partridge, MD Taking Active   levothyroxine (SYNTHROID) 150 MCG tablet [id number] Yes Take 1 tablet (150 mcg total) by mouth daily before breakfast. Must keep scheduled appt for future refills Corwin Levins, MD Taking Active Self, Spouse/Significant Other, Pharmacy Records  losartan (COZAAR) 100 MG tablet [id number] Yes Take 1 tablet (100 mg total) by mouth daily. Laurann Montana, PA-C Taking Active            Med Note Michaela Corner   Mon Feb 12, 2024  1:29 PM) 02/12/24: Reports during [facility] call, she has not paused this medication as recommended at time of hospital discharge on 02/10/24: stated "I am not stopping it unless my regular doctor tells me to"  metFORMIN (GLUCOPHAGE) 500 MG tablet [id number] Yes Take 1 tablet (500 mg total) by mouth daily. Must keep scheduled appt for future refills Corwin Levins, MD Taking Active Self, Spouse/Significant Other, Pharmacy Records  metoprolol succinate (TOPROL-XL) 100 MG 24 hr tablet  [id number] Yes Take 100 mg by mouth daily. Take with or immediately  following a meal. [provider] Taking Active            Med Note Michaela Corner   Mon Feb 12, 2024  1:32 PM) 02/12/24: Reports during [facility] call, she has not paused this medication as recommended at time of hospital discharge on 02/10/24: stated "I am not stopping it unless my regular doctor tells me to"   modafinil (PROVIGIL) 100 MG tablet [id number] Yes Take 1 tablet (100 mg total) by mouth daily. Take daily at 1230 with a 200mg  tablet. Oretha Milch, MD Taking Active   modafinil (PROVIGIL) 200 MG tablet [id number] Yes Take 1 tablet (200 mg total) by mouth daily. Take 1 tablet by mouth daily at 1230 with a 100mg  tablet Oretha Milch, MD Taking Active   rosuvastatin (CRESTOR) 20 MG tablet [id number] Yes Take 1 tablet (20 mg total) by mouth daily. Corwin Levins, MD Taking Active Self, Spouse/Significant Other, Pharmacy Records  Semaglutide, 2 MG/DOSE, 8 MG/3ML [facility] [id number] Yes Inject 2 mg into the skin as directed once a week. Langston Reusing, MD Taking Active   sertraline (ZOLOFT) 100 MG tablet [id number] Yes Take 2 tablets (200 mg total) by mouth daily. Mozingo, Thereasa Solo, NP Taking Active   spironolactone (ALDACTONE) 25 MG tablet [id number] Yes Take 1 tablet (25 mg total) by mouth daily. Corwin Levins, MD Taking Active Self, Spouse/Significant Other, Pharmacy Records           Med Note Marilu Favre Feb 12, 2024  1:30 PM) 02/12/24: Reports during [facility] call, she has not paused this medication as recommended at time of hospital discharge on 02/10/24: stated "I am not stopping it unless my regular doctor tells me to"   torsemide (DEMADEX) 20 MG tablet [id number] Yes Take 4 tablets (80 mg total) by mouth 2 (two) times daily as needed.  Patient taking differently: Take 120 mg by mouth daily.   Laurann Montana, PA-C Taking Active            Med Note Michaela Corner   Mon Feb 12, 2024  1:31 PM) 02/12/24: Reports during [facility] call, she has not paused this medication as recommended  at time of hospital discharge on 02/10/24: stated "I am not stopping it unless my regular doctor tells me to"   triamcinolone cream (KENALOG) 0.1 % [id number] Yes Apply 1 Application topically 2 (two) times daily.  Patient taking differently: Apply 1 Application topically daily as needed (Apply to hands).   Corwin Levins, MD Taking Active Self, Spouse/Significant Other, Pharmacy Records  Vitamin D, Ergocalciferol, (DRISDOL) 1.25 MG (50000 UNIT) CAPS capsule [id number] Yes Take 1 capsule (50,000 Units total) by mouth every 7 (seven) days. Langston Reusing, MD Taking Active            Home Care and Equipment/Supplies: Were Home Health Services Ordered?: No Any new equipment or medical supplies ordered?: No  Functional Questionnaire: Do you need assistance with bathing/showering or dressing?: No Do you need assistance with meal preparation?: No Do you need assistance with eating?: No Do you have difficulty maintaining continence: No Do you need assistance with getting out of bed/getting out of a chair/moving?: No Do you have difficulty managing or taking your medications?: No  Follow up appointments reviewed: PCP Follow-up appointment confirmed?: Yes (care coordination outreach in real-time with scheduling care guide to successfully schedule hospital follow up PCP  appointment 02/16/24) Date of PCP follow-up appointment?: 02/16/24 Follow-up Provider: PCP Dr. Laray Anger Follow-up appointment confirmed?: Yes Date of Specialist follow-up appointment?: 02/27/24 Follow-Up Specialty Provider:: pulmonary provider Do you need transportation to your follow-up appointment?: No Do you understand care options if your condition(s) worsen?: Yes-patient verbalized understanding  SDOH Interventions Today    Flowsheet Row Most Recent Value  SDOH Interventions   Food Insecurity Interventions Intervention Not Indicated  Housing Interventions Intervention Not Indicated  Transportation  Interventions Intervention Not Indicated  [drives self,  spouse assists as indicated]  Utilities Interventions Intervention Not Indicated      Interventions Today    Flowsheet Row Most Recent Value  Chronic Disease   Chronic disease during today's visit Other, Congestive Heart Failure (CHF)  [sepsis secondary to diverticulitis- without need for surgery]  General Interventions   General Interventions Discussed/Reviewed General Interventions Discussed, Durable Medical Equipment (DME), Doctor Visits, Communication with  Doctor Visits Discussed/Reviewed Doctor Visits Discussed, PCP, Specialist  Durable Medical Equipment (DME) Other  [confirmed not currently requiring/ using assistive devices for ambulation]  PCP/Specialist Visits Compliance with follow-up visit  Communication with PCP/Specialists  Education Interventions   Education Provided Provided Education  Provided Verbal Education On Medication, When to see the doctor  [cautioned patient about resuming blood pressure medications when she was advised to pause these medications,  provided education around rationale for pausing medications- made PCP aware of same]  Nutrition Interventions   Nutrition Discussed/Reviewed Nutrition Discussed  Pharmacy Interventions   Pharmacy Dicussed/Reviewed Pharmacy Topics Discussed  [Full medication review with updating medication list in EHR per patient report]      TOC Interventions Today    Flowsheet Row Most Recent Value  TOC Interventions   TOC Interventions Discussed/Reviewed TOC Interventions Discussed, Arranged PCP follow up within 7 days/Care Guide scheduled  [Patient declines need for ongoing/ further care management outreach,  declines enrollment in 30-day TOC program,  provided my direct contact information should questions/ concerns/ needs arise post-TOC call]      Total time spent from review to signing of note/ including any care coordination interventions:  40 minutes  Pls call/  message for questions,  Caryl Pina, RN, BSN, Media planner  Transitions of Care  [facility] [phone]: direct

## 2024-02-14 ENCOUNTER — Ambulatory Visit (INDEPENDENT_AMBULATORY_CARE_PROVIDER_SITE_OTHER): Payer: BC Managed Care – PPO | Admitting: Psychology

## 2024-02-14 DIAGNOSIS — F428 Other obsessive-compulsive disorder: Secondary | ICD-10-CM | POA: Diagnosis not present

## 2024-02-14 DIAGNOSIS — F331 Major depressive disorder, recurrent, moderate: Secondary | ICD-10-CM | POA: Diagnosis not present

## 2024-02-14 DIAGNOSIS — F4310 Post-traumatic stress disorder, unspecified: Secondary | ICD-10-CM

## 2024-02-14 NOTE — Progress Notes (Unsigned)
 [facility] Counselor/Therapist Progress Note  Patient ID: Alexandra Beck, MRN: [id number],    Date: 319/2025  Time spent: 60 minutes  Time In:  1:04 Time out:2:04  Treatment Type: Individual Therapy  Reported Symptoms: sadness,   Mental Status Exam: Appearance:  Casual     Behavior: Appropriate  Motor: Normal  Speech/Language:  Normal Rate  Affect: blunted  Mood: pleasant  Thought process: normal  Thought content:   WNL  Sensory/Perceptual disturbances:   WNL  Orientation: oriented to person, place, time/date, and situation  Attention: Good  Concentration: Good  Memory: WNL  Fund of knowledge:  Good  Insight:   Good  Judgment:  Good  Impulse Control: Good   Risk Assessment: Danger to Self:  No Self-injurious Behavior: No Danger to Others: No Duty to Warn:no Physical Aggression / Violence:No  Access to Firearms a concern: No  Gang Involvement:No   Subjective: The patient attended a face-to-face individual therapy session today via video visit .  The patient gave verbal consent for the video visit to be on Caregility and the patient is aware of the limitations of telehealth.  The patient was in her home alone and the therapist was in the office.  The patient presents as pleasant and cooperative.  The patient reports that she was in the hospital from Tuesday until Saturday.  She reports that she had sepsis and that her experience was not so good at [facility].  We talked about the need for her to be able to make a complaint because she felt very dismissed by the physician that was attending her.  I gave her the number for the patient experienced team and hopefully she will be able to make a complaint related to her hospital stay.  The patient was distraught about what happened and we talked about it and processed how she handled it.  Interventions: Cognitive Behavioral Therapy, Assertiveness/Communication, and Problem solving, psycho  education  Diagnosis:Obsessional thoughts  Major depressive disorder, recurrent episode, moderate (HCC)  PTSD (post-traumatic stress disorder)  Plan: Treatment Plan  Strengths/Abilities:  Intelligent, ability for insight, supportive wife  Treatment Preferences:  Outpatient Individual therapy  Statement of Needs:  "I Need help with my depression and motivation. "  Symptoms:Depressed or irritable mood.:(Status: improved). Diminished interest in or  enjoyment of activities.(Status: improved). Feelings of hopelessness,  worthlessness, or inappropriate guilt.: (Status: improved). History of chronic or recurrent depression for which the client has taken antidepressant medication, been hospitalized, had  outpatient treatment, or had a course of electroconvulsive therapy.(Status:  maintained). Lack of energy.: (Status: maintained). Low self-esteem.:  (Status: improved). Poor concentration and indecisiveness.:  (Status: improved). Social withdrawal.:  (Status: maintained).  Unresolved grief issues.: (Status: maintained).  Problems Addressed:  Unipolar Depression  Goals:  LTG:1. Alleviate depressive symptoms and return to previous level of effective  functioning.  70% 2. Appropriately grieve the loss in order to normalize mood and to return  to previously adaptive level of functioning. 3. Develop healthy interpersonal relationships that lead to the alleviation  and help prevent the relapse of depression.  70% 4. Develop healthy thinking patterns and beliefs about self, others, and the world that lead to the alleviation and help prevent the relapse of  Depression  70% STG:1.Describe current and past experiences with depression including their impact on functioning and  attempts to resolve it.  70 % 2. Identify and replace thoughts and beliefs that support depression.  70 % 3.Learn and implement behavioral strategies to overcome depression. 70%  4.Verbalize an understanding of healthy and  unhealthy emotions with the intent of increasing the use of  healthy emotions to guide actions.  50 %  Target Date:  10/07/2024 Frequency: Bi Weekly Modality:Individual Interventions by Therapist:  CBT, problem solving, EMDR, insight oriented  Patient approved Treatment Plan  Saud Bail Harrel Lemon, LCSW

## 2024-02-16 ENCOUNTER — Other Ambulatory Visit (HOSPITAL_COMMUNITY): Payer: Self-pay

## 2024-02-16 ENCOUNTER — Encounter: Payer: Self-pay | Admitting: Internal Medicine

## 2024-02-16 ENCOUNTER — Ambulatory Visit: Admitting: Internal Medicine

## 2024-02-16 ENCOUNTER — Other Ambulatory Visit: Payer: Self-pay

## 2024-02-16 VITALS — BP 126/78 | HR 85 | Temp 97.9°F | Ht 63.0 in | Wt 383.0 lb

## 2024-02-16 DIAGNOSIS — Z09 Encounter for follow-up examination after completed treatment for conditions other than malignant neoplasm: Secondary | ICD-10-CM | POA: Diagnosis not present

## 2024-02-16 DIAGNOSIS — Z8719 Personal history of other diseases of the digestive system: Secondary | ICD-10-CM | POA: Diagnosis not present

## 2024-02-16 DIAGNOSIS — K5720 Diverticulitis of large intestine with perforation and abscess without bleeding: Secondary | ICD-10-CM

## 2024-02-16 DIAGNOSIS — I152 Hypertension secondary to endocrine disorders: Secondary | ICD-10-CM

## 2024-02-16 DIAGNOSIS — N321 Vesicointestinal fistula: Secondary | ICD-10-CM

## 2024-02-16 DIAGNOSIS — B3731 Acute candidiasis of vulva and vagina: Secondary | ICD-10-CM

## 2024-02-16 DIAGNOSIS — E1169 Type 2 diabetes mellitus with other specified complication: Secondary | ICD-10-CM

## 2024-02-16 DIAGNOSIS — E1159 Type 2 diabetes mellitus with other circulatory complications: Secondary | ICD-10-CM

## 2024-02-16 DIAGNOSIS — E669 Obesity, unspecified: Secondary | ICD-10-CM

## 2024-02-16 MED ORDER — METOPROLOL SUCCINATE ER 100 MG PO TB24
100.0000 mg | ORAL_TABLET | Freq: Every day | ORAL | 3 refills | Status: DC
  Filled 2024-02-16: qty 90, 90d supply, fill #0
  Filled 2024-06-05: qty 90, 90d supply, fill #1

## 2024-02-16 MED ORDER — FLUCONAZOLE 150 MG PO TABS
150.0000 mg | ORAL_TABLET | ORAL | 1 refills | Status: DC | PRN
  Filled 2024-02-16: qty 2, 6d supply, fill #0

## 2024-02-16 NOTE — Assessment & Plan Note (Signed)
 Lab Results  Component Value Date   HGBA1C 6.1 (H) 11/13/2023   Stable, pt to continue current medical treatment and continue f/u with plan for bariatric surgury

## 2024-02-16 NOTE — Assessment & Plan Note (Signed)
 For eventual f/u after bariatric surgury with colorectal surgury at [facility]

## 2024-02-16 NOTE — Assessment & Plan Note (Signed)
 Clinicaly resolved

## 2024-02-16 NOTE — Patient Instructions (Signed)
 Please take all new medication as prescribed - the diflucan  Please continue all other medications as before, and refills have been done if requested.  Please have the pharmacy call with any other refills you may need.  Please keep your appointments with your specialists as you may have planned - bariatric surgury and colorectal surgury at [facility]  Please follow up as planned for the repeat cbc (blood count) with Hematology  Please make an Appointment to return in 6 months, or sooner if needed

## 2024-02-16 NOTE — Assessment & Plan Note (Signed)
 BP Readings from Last 3 Encounters:  02/16/24 126/78  02/12/24 133/66  02/10/24 139/75   Stable, pt to continue medical treatment losartan 100 every day, toprol xl 100 qd

## 2024-02-16 NOTE — Assessment & Plan Note (Signed)
 For diflucan 150 mg prn

## 2024-02-16 NOTE — Progress Notes (Addendum)
 Patient ID: Alexandra Beck, female   DOB: 1981-03-24, 43 y.o.   MRN: [id number]        Chief Complaint: follow up post hospn 3/11 - 3/15       HPI:  Alexandra Beck is a 43 y.o. female here with above,  43 y.o. female with medical history significant of non-insulin-dependent diabetes, chronic diastolic heart failure, obesity hypoventilation syndrome, obstructive sleep apnea, depression, GERD, essential hypertension, hypothyroidism, hyperlipidemia, anemia of chronic disease, recurrent diverticulitis Patient was also hypoxic with oxygen sat 88% on room air. She was placed on 3 L and brought to the ER. She complained of generalized weakness. Patient was also having some abdominal pain In the ER patient was evaluated and found to have met sepsis criteria. She also has severe sepsis due to hypotension, initial hypoxemia,lactate of 4  . Further testing shows that patient has residual diverticulitis with diverticular abscess and findings worrisome for colovesical fistula which patient knows about since November last year. Patient was seen by infectious disease who note clinical improvement with antibiotics. She is recommended to go home to complete antibiotic course with 3 more days of Levaquin. Finished 3 days of levaquin.  Despite being asked to not take the BB, pt has been taking toprol xl 100 every day since d/c  Pt denies chest pain, increased sob or doe, wheezing, orthopnea, PND, increased LE swelling, palpitations, dizziness or syncope.   Pt denies polydipsia, polyuria, or new focal neuro s/s.   Doeshave mild yeast infection symptoms post antibix.  Next cardiology in may 2025. Has pulm for Apr 1.  As been seeing [facility] colorectal surgury and plans to f/u soon.   Has been working towards bariatric surgury first to get the wt down prior to fistula.  Course complicated by anemia with goal to get the Hgb > 10 preop.    Transitional Care Management elements noted today: 1)  Date of D/C: as above 2)  Medication  reconciliation:  done today at end visit 3)  Review of D/C summary or other information:  done today 4)  Review of need for f/u on pending diagnostic tests and treatments:  done today 5)  Review of need for Interaction with other providers who will assume or resume care of pt specific problems: done today - none 6)  Education of patient/family/guardian or caregiver: none needed  Wt Readings from Last 3 Encounters:  02/16/24 (!) 383 lb (173.7 kg)  02/12/24 (!) 387 lb 12 oz (175.9 kg)  02/08/24 (!) 384 lb 11.2 oz (174.5 kg)   BP Readings from Last 3 Encounters:  02/16/24 126/78  02/12/24 133/66  02/10/24 139/75         Past Medical History:  Diagnosis Date   Acute renal failure (ARF) (HCC) 11/2012    multifactorial-likely secondary to ATN in the setting of sepsis and hypotension, and also from rhabdomyolysis   Anemia    Cellulitis    Chronic diastolic CHF (congestive heart failure) (HCC)    Depression    Diabetes mellitus (HCC)    GERD (gastroesophageal reflux disease)    Hyperlipidemia    Hypertension    Hypothyroidism    Morbid obesity with BMI of 70 and over, adult (HCC)    Obesity hypoventilation syndrome (HCC)    OSA (obstructive sleep apnea)    PVC's (premature ventricular contractions)    Recurrent cellulitis of lower leg    LLE, venous insuff   Sepsis (HCC) 11/2012   Secondary to cellulitis   Sleep  apnea    Venous insufficiency of leg    Past Surgical History:  Procedure Laterality Date   IR FLUORO GUIDE CV MIDLINE PICC RIGHT  03/28/2017   IR US GUIDE VASC ACCESS RIGHT  03/28/2017   WISDOM TOOTH EXTRACTION      reports that she quit smoking about 14 years ago. Her smoking use included cigarettes. She started smoking about 22 years ago. She has a 4 pack-year smoking history. She has never used smokeless tobacco. She reports that she does not drink alcohol and does not use drugs. family history includes Diabetes in her mother; Heart disease in her father; High  Cholesterol in her father and mother; Hypertension in her father and mother; Obesity in her father and mother; Sleep apnea in her father and mother; Thyroid disease in her mother. Allergies  Allergen Reactions   Aspirin Hives, Swelling and Other (See Comments)    REACTION: throat swelling, hives  Other reaction(s): Unknown  REACTION: throat swelling, hives Other reaction(s): Unknown Other reaction(s): Unknown   Doxycycline Other (See Comments)    Abdominal pain, difficulty swallowing   Lisinopril Cough   Niacin     Other reaction(s): Unknown Other reaction(s): Unknown   Niacin And Related     Other reaction(s): Unknown Other reaction(s): Unknown   Niaspan [Niacin Er (Antihyperlipidemic)]     Caused flushing   Wound Dressing Adhesive Itching    Welts and burning of the skin   Wound Dressings Itching    Welts and burning of the skin   Current Outpatient Medications on File Prior to Visit  Medication Sig Dispense Refill   lamoTRIgine (LAMICTAL) 200 MG tablet Take 1 tablet (200 mg total) by mouth daily. 90 tablet 1   levothyroxine (SYNTHROID) 150 MCG tablet Take 1 tablet (150 mcg total) by mouth daily before breakfast. Must keep scheduled appt for future refills 90 tablet 3   [Paused] losartan (COZAAR) 100 MG tablet Take 1 tablet (100 mg total) by mouth daily. 90 tablet 3   metFORMIN (GLUCOPHAGE) 500 MG tablet Take 1 tablet (500 mg total) by mouth daily. Must keep scheduled appt for future refills 90 tablet 3   modafinil (PROVIGIL) 100 MG tablet Take 1 tablet (100 mg total) by mouth daily. Take daily at 1230 with a 200mg  tablet. 30 tablet 1   modafinil (PROVIGIL) 200 MG tablet Take 1 tablet (200 mg total) by mouth daily. Take 1 tablet by mouth daily at 1230 with a 100mg  tablet 30 tablet 1   rosuvastatin (CRESTOR) 20 MG tablet Take 1 tablet (20 mg total) by mouth daily. 90 tablet 3   Semaglutide, 2 MG/DOSE, 8 MG/3ML SOPN Inject 2 mg into the skin as directed once a week. 9 mL 0    sertraline (ZOLOFT) 100 MG tablet Take 2 tablets (200 mg total) by mouth daily. 180 tablet 1   [Paused] spironolactone (ALDACTONE) 25 MG tablet Take 1 tablet (25 mg total) by mouth daily. 90 tablet 3   [Paused] torsemide (DEMADEX) 20 MG tablet Take 4 tablets (80 mg total) by mouth 2 (two) times daily as needed. (Patient taking differently: Take 120 mg by mouth daily.) 720 tablet 2   triamcinolone cream (KENALOG) 0.1 % Apply 1 Application topically 2 (two) times daily. (Patient taking differently: Apply 1 Application topically daily as needed (Apply to hands).) 30 g 1   Vitamin D, Ergocalciferol, (DRISDOL) 1.25 MG (50000 UNIT) CAPS capsule Take 1 capsule (50,000 Units total) by mouth every 7 (seven) days. 4 capsule 0  No current facility-administered medications on file prior to visit.        ROS:  All others reviewed and negative.  Objective        PE:  BP 126/78 (BP Location: Left Arm, Patient Position: Sitting, Cuff Size: Normal)   Pulse 85   Temp 97.9 F (36.6 C) (Oral)   Ht 5\' 3"  (1.6 m)   Wt (!) 383 lb (173.7 kg)   LMP 01/31/2024 (Approximate)   SpO2 94%   BMI 67.85 kg/m                 Constitutional: Pt appears in NAD               HENT: Head: NCAT.                Right Ear: External ear normal.                 Left Ear: External ear normal.                Eyes: . Pupils are equal, round, and reactive to light. Conjunctivae and EOM are normal               Nose: without d/c or deformity               Neck: Neck supple. Gross normal ROM               Cardiovascular: Normal rate and regular rhythm.                 Pulmonary/Chest: Effort normal and breath sounds without rales or wheezing.                Abd:  Soft, NT, ND, + BS, no organomegaly               Neurological: Pt is alert. At baseline orientation, motor grossly intact               Skin: Skin is warm. No rashes, no other new lesions, LE edema - none               Psychiatric: Pt behavior is normal without  agitation   Micro: none  Cardiac tracings I have personally interpreted today:  none  Pertinent Radiological findings (summarize): none   Lab Results  Component Value Date   WBC 7.8 02/10/2024   HGB 7.2 (L) 02/10/2024   HCT 25.3 (L) 02/10/2024   PLT 271 02/10/2024   GLUCOSE 101 (H) 02/10/2024   CHOL 176 03/13/2023   TRIG 158 (H) 03/13/2023   HDL 58 03/13/2023   LDLDIRECT 141.0 03/02/2016   LDLCALC 91 03/13/2023   ALT 10 02/08/2024   AST 11 (L) 02/08/2024   NA 137 02/10/2024   K 4.0 02/10/2024   CL 105 02/10/2024   CREATININE 1.05 (H) 02/10/2024   BUN 15 02/10/2024   CO2 23 02/10/2024   TSH 2.040 03/13/2023   INR 1.3 (H) 02/07/2024   HGBA1C 6.1 (H) 11/13/2023   MICROALBUR 8.05 (H) 06/26/2007   Assessment/Plan:  Alexandra Beck is a 43 y.o. White or Caucasian [1] female with  has a past medical history of Acute renal failure (ARF) (HCC) (11/2012), Anemia, Cellulitis, Chronic diastolic CHF (congestive heart failure) (HCC), Depression, Diabetes mellitus (HCC), GERD (gastroesophageal reflux disease), Hyperlipidemia, Hypertension, Hypothyroidism, Morbid obesity with BMI of 70 and over, adult (HCC), Obesity hypoventilation syndrome (HCC), OSA (obstructive sleep apnea), PVC's (premature ventricular contractions), Recurrent cellulitis  of lower leg, Sepsis (HCC) (11/2012), Sleep apnea, and Venous insufficiency of leg.  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  Alexandra Barre, MD 02/16/2024 8:17 PM [facility]

## 2024-02-19 ENCOUNTER — Inpatient Hospital Stay

## 2024-02-19 VITALS — BP 137/73 | HR 78 | Temp 98.8°F | Resp 19 | Wt 377.8 lb

## 2024-02-19 DIAGNOSIS — D509 Iron deficiency anemia, unspecified: Secondary | ICD-10-CM | POA: Diagnosis not present

## 2024-02-19 MED ORDER — SODIUM CHLORIDE 0.9 % IV SOLN: INTRAVENOUS | Status: DC

## 2024-02-19 MED ORDER — SODIUM CHLORIDE 0.9 % IV SOLN
300.0000 mg | Freq: Once | INTRAVENOUS | Status: AC
  Administered 2024-02-19: 300 mg via INTRAVENOUS
  Filled 2024-02-19: qty 300

## 2024-02-19 NOTE — Patient Instructions (Signed)

## 2024-02-19 NOTE — Progress Notes (Signed)
 Pt declined 30 min post obs today, discharged with VSS, ambulatory to lobby.

## 2024-02-23 ENCOUNTER — Ambulatory Visit: Payer: 59 | Admitting: Psychology

## 2024-02-23 DIAGNOSIS — F428 Other obsessive-compulsive disorder: Secondary | ICD-10-CM | POA: Diagnosis not present

## 2024-02-23 DIAGNOSIS — F331 Major depressive disorder, recurrent, moderate: Secondary | ICD-10-CM | POA: Diagnosis not present

## 2024-02-23 DIAGNOSIS — F4310 Post-traumatic stress disorder, unspecified: Secondary | ICD-10-CM | POA: Diagnosis not present

## 2024-02-23 NOTE — Progress Notes (Signed)
 [facility] Counselor/Therapist Progress Note  Patient ID: SHOUA ULLOA, MRN: [id number],    Date: 02/22/2024  Time spent: 58 minutes  Time In:  1:06 Time out:2:04  Treatment Type: Individual Therapy  Reported Symptoms: sadness,   Mental Status Exam: Appearance:  Casual     Behavior: Appropriate  Motor: Normal  Speech/Language:  Normal Rate  Affect: blunted  Mood: pleasant  Thought process: normal  Thought content:   WNL  Sensory/Perceptual disturbances:   WNL  Orientation: oriented to person, place, time/date, and situation  Attention: Good  Concentration: Good  Memory: WNL  Fund of knowledge:  Good  Insight:   Good  Judgment:  Good  Impulse Control: Good   Risk Assessment: Danger to Self:  No Self-injurious Behavior: No Danger to Others: No Duty to Warn:no Physical Aggression / Violence:No  Access to Firearms a concern: No  Gang Involvement:No   Subjective: The patient attended a face-to-face individual therapy session today via video visit .  The patient gave verbal consent for the video visit to be on Caregility and the patient is aware of the limitations of telehealth.  The patient was in her home alone and the therapist was in the office.  We started on caregility and unfortunately could not continue because the audio was not working.  We ended up going just to the phone during the middle part of the session.  The patient reports that she still has to wait for blood to be drawn again to see if her infusion worked before they can schedule the surgery.  She talked about some problems that they were having getting some records from our system even though they are in the same system they could not see her appointments with [facility].  She was very diligent and was able to get the dates and information about all of the session she has had since 2023 at [facility] to [facility] to the [facility] bariatric team.  I explained that it is  possible that because insurance is now involved that there may be more hoops to jump through and that that may be part of the issue with trying to gather the information that is needed to make sure that that surgery gets approved.  She is anxious to go ahead and have this done and eventually get her colorectal surgery that she needs to stop the fistula that she has.  I encouraged her to continue to be diligent about advocating for herself and she seems to be doing okay with her wife at the moment except that she is not doing as much around the house as her wife would prefer.  I encouraged her to at least do 1 thing a day that would be helpful to her wife that she normally does not do.  Interventions: Cognitive Behavioral Therapy, Assertiveness/Communication, and Problem solving, psycho education  Diagnosis:Obsessional thoughts  Major depressive disorder, recurrent episode, moderate (HCC)  PTSD (post-traumatic stress disorder)  Plan: Treatment Plan  Strengths/Abilities:  Intelligent, ability for insight, supportive wife  Treatment Preferences:  Outpatient Individual therapy  Statement of Needs:  "I Need help with my depression and motivation. "  Symptoms:Depressed or irritable mood.:(Status: improved). Diminished interest in or  enjoyment of activities.(Status: improved). Feelings of hopelessness,  worthlessness, or inappropriate guilt.: (Status: improved). History of chronic or recurrent depression for which the client has taken antidepressant medication, been hospitalized, had  outpatient treatment, or had a course of electroconvulsive therapy.(Status:  maintained). Lack  of energy.: (Status: maintained). Low self-esteem.:  (Status: improved). Poor concentration and indecisiveness.:  (Status: improved). Social withdrawal.:  (Status: maintained).  Unresolved grief issues.: (Status: maintained).  Problems Addressed:  Unipolar Depression  Goals:  LTG:1. Alleviate depressive symptoms and  return to previous level of effective  functioning.  70% 2. Appropriately grieve the loss in order to normalize mood and to return  to previously adaptive level of functioning. 3. Develop healthy interpersonal relationships that lead to the alleviation  and help prevent the relapse of depression.  70% 4. Develop healthy thinking patterns and beliefs about self, others, and the world that lead to the alleviation and help prevent the relapse of  Depression  70% STG:1.Describe current and past experiences with depression including their impact on functioning and  attempts to resolve it.  70 % 2. Identify and replace thoughts and beliefs that support depression.  70 % 3.Learn and implement behavioral strategies to overcome depression. 70% 4.Verbalize an understanding of healthy and unhealthy emotions with the intent of increasing the use of  healthy emotions to guide actions.  50 %  Target Date:  10/07/2024 Frequency: Bi Weekly Modality:Individual Interventions by Therapist:  CBT, problem solving, EMDR, insight oriented  Patient approved Treatment Plan  Yaphet Smethurst Harrel Lemon, LCSW

## 2024-02-27 ENCOUNTER — Encounter: Payer: Self-pay | Admitting: Primary Care

## 2024-02-27 ENCOUNTER — Ambulatory Visit (INDEPENDENT_AMBULATORY_CARE_PROVIDER_SITE_OTHER): Payer: 59 | Admitting: Primary Care

## 2024-02-27 ENCOUNTER — Other Ambulatory Visit (HOSPITAL_COMMUNITY): Payer: Self-pay

## 2024-02-27 VITALS — BP 138/64 | HR 68 | Temp 97.4°F | Ht 62.5 in | Wt 378.2 lb

## 2024-02-27 DIAGNOSIS — G47419 Narcolepsy without cataplexy: Secondary | ICD-10-CM

## 2024-02-27 DIAGNOSIS — G4733 Obstructive sleep apnea (adult) (pediatric): Secondary | ICD-10-CM

## 2024-02-27 DIAGNOSIS — I5032 Chronic diastolic (congestive) heart failure: Secondary | ICD-10-CM | POA: Diagnosis not present

## 2024-02-27 MED ORDER — MODAFINIL 100 MG PO TABS
100.0000 mg | ORAL_TABLET | Freq: Every day | ORAL | 3 refills | Status: DC
Start: 2024-02-27 — End: 2024-03-22
  Filled 2024-02-27: qty 30, 30d supply, fill #0

## 2024-02-27 MED ORDER — MODAFINIL 200 MG PO TABS
200.0000 mg | ORAL_TABLET | Freq: Every day | ORAL | 3 refills | Status: DC
  Filled 2024-02-27 – 2024-03-09 (×2): qty 30, 30d supply, fill #0

## 2024-02-27 NOTE — Progress Notes (Signed)
 @Patient  ID: Alexandra Beck, female    DOB: 09-10-1981, 43 y.o.   MRN: [id number]  Chief Complaint  Patient presents with   Follow-up    CPAP f/u    Referring provider: Corwin Levins, MD  HPI: 43 year old female, former smoker quit in 2011 (<10 pack year hx). PMH significant for OSA, obesity hypoventilation syndrome, narcolepsy without cataplexy, delayed sleep phase, chronic diastolic heart failure. Patient of Dr. Vassie Loll. NPSG 2009:  AHI 93/hr. CPAP titration 05/2018 >> CPAP 15 + 3L O2 (434 lbs ).   Previous [facility] pulmonary encounter: 04/29/2022 Patient contacted today for 44-month follow-up via video visit.  She has a history of OSA/OHS and narcolepsy without cataplexy.  She reports 100% compliance with CPAP.  She takes modafinil (provigil) 300mg  in the morning, this is working well for her. She had been on 200mg  in the morning but she would get tired mid morning/afternoon and had a hard time remembering to take her afternoon provigil dose. She wears CPAP  on average 8 hours. She works from [employer]. Typically sleeps from 2am-10am. She received message on her CPAP states that motor had exceeded lifetime expectancy. She needs a new order for machine. She is unhappy with [employer], states that she has a hard time getting a hold of anyone to speak with. She will stay with DME company however.   08/09/22- Dr. Vassie Loll  She was hospitalized 04/2022 for acute respiratory failure.  She went into the ED with shortness of breath and fevers.  Treated for suspected With antibiotics given her recent URI symptoms, fever, cough and shortness of breath.  CT chest showed pulmonary edema and vascular congestion.  She was diuresed with good response and improved oxygen requirements.  She was able to be discharged on 6/20 on 2 L supplemental O2 with activity. Subsequently weaned off oxygen. Office visit from 06/03/2022 was reviewed She is on torsemide 80 mg twice daily which she takes on an as needed basis. Weight is down about 20  pounds to 433 pounds She obtained a new CPAP machine set at auto settings 12 to 20 cm and reports bloating in the morning and she has to burp in the morning. She remains on modafinil 200 mg in the morning which takes her through her workday     02/27/2024- Interim hx  Discussed the use of AI scribe software for clinical note transcription with the patient, who gave verbal consent to proceed.  Last seen by Dr. Vassie Loll on 08/09/22 CHF on Torsemide- She has lost roughly 50 lbs since 2023  Narcolepsy without cataplexy- modafinil 300mg  for persistent hypersomnolence OSA on CPAP with settings 12-20cm h20     History of Present Illness   Alexandra Beck is a 43 year old female with sleep apnea and narcolepsy who presents for follow-up.  She has a history of severe sleep apnea, diagnosed in college, and consistently uses a CPAP machine. A recent download shows usage on 27 out of the past 30 days. Her CPAP settings are auto-set with a minimum pressure of 12 and a maximum of 20cm h20. Her apnea score is 3.2, improved from her original sleep study in 2009, which showed 93 apneic events per hour. She also uses 3 liters of oxygen at night due to chronic respiratory failure and obesity hypoventilation syndrome.  She is treated for narcolepsy with modafinil, 300 mg in the morning. If she takes her medication later in the day, she experiences excessive sleepiness, such as falling asleep in  a chair. She experiences some breakthrough sleepiness in the afternoons, despite taking her medication as prescribed. No new medications have been added that could contribute to this issue, aside from adjusting the timing of her Lamictal for mental health, which she now takes at night. Epworth score 8/24.   She has chronic respiratory failure secondary to obesity hypoventilation syndrome, managed with CPAP and supplemental oxygen at night. Current management appears stable with no acute issues reported.  She has a history of  diastolic heart failure, with a past ejection fraction of 20% that has since recovered. She takes torsemide for fluid retention, typically 120 mg daily, but adjusts based on her fluid status. She reports recent hospitalization where she did not receive her diuretic for five days, leading to fluid overload. Post-discharge, she increased her diuretic dose to manage the fluid retention. She notes intermittent leg swelling and some difficulty breathing when fluid overloaded.  She has been treated for iron deficiency anemia, receiving three weeks of iron infusions and a blood transfusion in March due to a hemoglobin level of 6.5. Her hemoglobin improved slightly post-transfusion, and she continues to follow up with a hematologist.   She has been on Ozempic for weight management for over a year, following previous use of Mounjaro. She has lost over 50 pounds since her last visit, with a current weight of 374 pounds, down from 434 pounds. She attributes her weight loss to a combination of medication and participation in a metabolic weight clinic. She is considering bariatric surgery and is in the process of submitting her case to insurance.      Significant tests/ events reviewed   PFTs 09/2021 moderate restriction, no airway obstruction   NPSG 2009:  AHI 93/hr On auto 5-20cm   ABG  06/2014 showing mild hypercarbia 7.35/50 1/90 5/97%    CPAP titration 05/2018 >> CPAP 15 + 3L O2 (434 lbs )   MSLT >> SOREMs x3     Allergies  Allergen Reactions   Aspirin Hives, Swelling and Other (See Comments)    REACTION: throat swelling, hives  Other reaction(s): Unknown  REACTION: throat swelling, hives Other reaction(s): Unknown Other reaction(s): Unknown   Doxycycline Other (See Comments)    Abdominal pain, difficulty swallowing   Lisinopril Cough   Niacin     Other reaction(s): Unknown Other reaction(s): Unknown   Niacin And Related     Other reaction(s): Unknown Other reaction(s): Unknown   Niaspan  [Niacin Er (Antihyperlipidemic)]     Caused flushing   Wound Dressing Adhesive Itching    Welts and burning of the skin   Wound Dressings Itching    Welts and burning of the skin    Immunization History  Administered Date(s) Administered   Influenza, Seasonal, Injecte, Preservative Fre 09/25/2023   Influenza,inj,Quad PF,6+ Mos 08/02/2013, 08/28/2016, 10/11/2017, 07/23/2019, 10/27/2021, 08/09/2022   PFIZER(Purple Top)SARS-COV-2 Vaccination 02/06/2020, 02/27/2020   Pneumococcal Polysaccharide-23 12/28/2012   Tdap 08/26/2015    Past Medical History:  Diagnosis Date   Acute renal failure (ARF) (HCC) 11/2012    multifactorial-likely secondary to ATN in the setting of sepsis and hypotension, and also from rhabdomyolysis   Anemia    Cellulitis    Chronic diastolic CHF (congestive heart failure) (HCC)    Depression    Diabetes mellitus (HCC)    GERD (gastroesophageal reflux disease)    Hyperlipidemia    Hypertension    Hypothyroidism    Morbid obesity with BMI of 70 and over, adult (HCC)    Obesity hypoventilation  syndrome (HCC)    OSA (obstructive sleep apnea)    PVC's (premature ventricular contractions)    Recurrent cellulitis of lower leg    LLE, venous insuff   Sepsis (HCC) 11/2012   Secondary to cellulitis   Sleep apnea    Venous insufficiency of leg     Tobacco History: Social History   Tobacco Use  Smoking Status Former   Current packs/day: 0.00   Average packs/day: 0.5 packs/day for 8.0 years (4.0 ttl pk-yrs)   Types: Cigarettes   Start date: 2003   Quit date: 2011   Years since quitting: 14.2  Smokeless Tobacco Never   Counseling given: Not Answered   Outpatient Medications Prior to Visit  Medication Sig Dispense Refill   fluconazole (DIFLUCAN) 150 MG tablet Take 1 tablet (150 mg total) by mouth every three (3) days as needed. 2 tablet 1   lamoTRIgine (LAMICTAL) 200 MG tablet Take 1 tablet (200 mg total) by mouth daily. 90 tablet 1   levothyroxine  (SYNTHROID) 150 MCG tablet Take 1 tablet (150 mcg total) by mouth daily before breakfast. Must keep scheduled appt for future refills 90 tablet 3   losartan (COZAAR) 100 MG tablet Take 1 tablet (100 mg total) by mouth daily. 90 tablet 3   metFORMIN (GLUCOPHAGE) 500 MG tablet Take 1 tablet (500 mg total) by mouth daily. Must keep scheduled appt for future refills 90 tablet 3   metoprolol succinate (TOPROL-XL) 100 MG 24 hr tablet Take 1 tablet (100 mg total) by mouth daily. Take with or immediately following a meal. 90 tablet 3   rosuvastatin (CRESTOR) 20 MG tablet Take 1 tablet (20 mg total) by mouth daily. 90 tablet 3   Semaglutide, 2 MG/DOSE, 8 MG/3ML SOPN Inject 2 mg into the skin as directed once a week. 9 mL 0   sertraline (ZOLOFT) 100 MG tablet Take 2 tablets (200 mg total) by mouth daily. 180 tablet 1   spironolactone (ALDACTONE) 25 MG tablet Take 1 tablet (25 mg total) by mouth daily. 90 tablet 3   torsemide (DEMADEX) 20 MG tablet Take 4 tablets (80 mg total) by mouth 2 (two) times daily as needed. (Patient taking differently: Take 120 mg by mouth daily.) 720 tablet 2   triamcinolone cream (KENALOG) 0.1 % Apply 1 Application topically 2 (two) times daily. (Patient taking differently: Apply 1 Application topically daily as needed (Apply to hands).) 30 g 1   Vitamin D, Ergocalciferol, (DRISDOL) 1.25 MG (50000 UNIT) CAPS capsule Take 1 capsule (50,000 Units total) by mouth every 7 (seven) days. 4 capsule 0   modafinil (PROVIGIL) 100 MG tablet Take 1 tablet (100 mg total) by mouth daily. Take daily at 1230 with a 200mg  tablet. 30 tablet 1   modafinil (PROVIGIL) 200 MG tablet Take 1 tablet (200 mg total) by mouth daily. Take 1 tablet by mouth daily at 1230 with a 100mg  tablet 30 tablet 1   No facility-administered medications prior to visit.   Review of Systems  Review of Systems  Constitutional: Negative.   HENT: Negative.    Respiratory: Negative.      Physical Exam  BP 138/64 (BP  Location: Right Arm, Patient Position: Sitting, Cuff Size: Normal)   Pulse 68   Temp (!) 97.4 F (36.3 C) (Temporal)   Ht 5' 2.5" (1.588 m)   Wt (!) 378 lb 3.2 oz (171.6 kg)   LMP 01/31/2024 (Approximate)   SpO2 94%   BMI 68.07 kg/m  Physical Exam Constitutional:  General: She is not in acute distress.    Appearance: Normal appearance. She is obese. She is not ill-appearing.  HENT:     Head: Normocephalic and atraumatic.     Mouth/Throat:     Mouth: Mucous membranes are moist.     Pharynx: Oropharynx is clear.  Cardiovascular:     Rate and Rhythm: Normal rate and regular rhythm.  Pulmonary:     Effort: Pulmonary effort is normal.     Breath sounds: Normal breath sounds. No wheezing, rhonchi or rales.  Musculoskeletal:        General: Normal range of motion.  Skin:    General: Skin is warm and dry.  Neurological:     General: No focal deficit present.     Mental Status: She is alert and oriented to person, place, and time. Mental status is at baseline.  Psychiatric:        Mood and Affect: Mood normal.        Behavior: Behavior normal.        Thought Content: Thought content normal.        Judgment: Judgment normal.      Lab Results:  CBC    Component Value Date/Time   WBC 7.8 02/10/2024 0342   RBC 3.33 (L) 02/10/2024 0342   HGB 7.2 (L) 02/10/2024 0342   HGB 8.7 (L) 10/13/2023 1058   HCT 25.3 (L) 02/10/2024 0342   HCT 27.4 (L) 11/13/2023 1325   PLT 271 02/10/2024 0342   PLT 316 10/13/2023 1058   MCV 76.0 (L) 02/10/2024 0342   MCV 88 10/13/2023 1058   MCH 21.6 (L) 02/10/2024 0342   MCHC 28.5 (L) 02/10/2024 0342   RDW 21.4 (H) 02/10/2024 0342   RDW 15.4 10/13/2023 1058   LYMPHSABS 0.3 (L) 02/06/2024 1934   LYMPHSABS 1.2 03/13/2023 1511   MONOABS 0.1 02/06/2024 1934   EOSABS 0.0 02/06/2024 1934   EOSABS 0.2 03/13/2023 1511   BASOSABS 0.0 02/06/2024 1934   BASOSABS 0.0 03/13/2023 1511    BMET    Component Value Date/Time   NA 137 02/10/2024 0342    NA 142 11/13/2023 1325   K 4.0 02/10/2024 0342   CL 105 02/10/2024 0342   CO2 23 02/10/2024 0342   GLUCOSE 101 (H) 02/10/2024 0342   BUN 15 02/10/2024 0342   BUN 20 11/13/2023 1325   CREATININE 1.05 (H) 02/10/2024 0342   CREATININE 1.06 11/14/2016 1050   CALCIUM 8.8 (L) 02/10/2024 0342   CALCIUM 9.6 06/26/2007 0000   GFRNONAA >60 02/10/2024 0342   GFRAA 81 11/11/2020 1211    BNP    Component Value Date/Time   BNP 172.8 (H) 02/06/2024 1933    ProBNP    Component Value Date/Time   PROBNP 145 (H) 03/26/2020 1602   PROBNP 641.0 (H) 08/30/2016 1413    Imaging: CT ABDOMEN PELVIS W CONTRAST Result Date: 02/06/2024 CLINICAL DATA:  Sepsis, fever, chills EXAM: CT ABDOMEN AND PELVIS WITH CONTRAST TECHNIQUE: Multidetector CT imaging of the abdomen and pelvis was performed using the standard protocol following bolus administration of intravenous contrast. RADIATION DOSE REDUCTION: This exam was performed according to the departmental dose-optimization program which includes automated exposure control, adjustment of the mA and/or kV according to patient size and/or use of iterative reconstruction technique. CONTRAST:  80mL OMNIPAQUE IOHEXOL 300 MG/ML  SOLN COMPARISON:  None Available. FINDINGS: Lower chest: No acute abnormality. Hepatobiliary: Cholelithiasis without superimposed pericholecystic inflammatory change. Mildly progressive, moderate hepatomegaly. Liver otherwise unremarkable; no enhancing intrahepatic  mass identified. No intra or extrahepatic biliary ductal dilation. Pancreas: Unremarkable. No pancreatic ductal dilatation or surrounding inflammatory changes. Spleen: Moderate splenomegaly with the spleen measuring 15.9 cm in greatest dimension, slightly enlarged since prior examination. Adrenals/Urinary Tract: The adrenal glands are unremarkable. The kidneys are normal in size and position. Since the prior examination there has developed mild perinephric inflammatory stranding suggesting an  underlying inflammatory process such as pyelonephritis. No hydronephrosis. No intrarenal or ureteral calculi. There is server frontal bladder wall thickening in keeping with an infectious or inflammatory cystitis. Additionally, there has developed punctate intraluminal gas within the bladder lumen non dependently. Inflammatory gas and fluid collection related to perforated diverticulitis is again seen abutting the anterior wall of the bladder, best seen on image # 74/3. Together, a colovesicular fistula is not excluded. Stomach/Bowel: Moderate pancolonic diverticulosis noted. There are inflammatory changes involving the mid sigmoid colon similar prior examination though less severe reflecting recurrent mild or residual diverticulitis. No, however, that an infiltrative mass could appear similarly, particularly given its persistence. A a loculated gas and fluid collection is again seen extending from the inflamed portion: 2 anterior bladder were there is focal bladder wall thickening in keeping with a residual diverticular abscess and possible colovesicular fistula. The stomach, small bowel, and large bowel are otherwise unremarkable. Appendix normal. No free intraperitoneal gas or fluid. Vascular/Lymphatic: No significant vascular findings are present. No enlarged abdominal or pelvic lymph nodes. Reproductive: A complex multiloculated cystic lesion is seen involving the left adnexa, not well characterized on this examination but better delineated since prior exam with improvement in surrounding inflammatory change. The pelvic organs are otherwise unremarkable. Other: Mild diffuse subcutaneous body wall edema. Musculoskeletal: No acute or significant osseous findings. IMPRESSION: 1. Moderate pancolonic diverticulosis. Recurrent mild or residual diverticulitis involving the mid sigmoid colon similar prior examination though less severe reflecting recurrent mild or residual diverticulitis. Endoscopy should be  considered, however, to exclude underlying infiltrative mass. 2. Residual diverticular abscess extending from the inflamed portion of the sigmoid colon to the anterior bladder wall where there is focal bladder wall thickening in keeping with an infectious or inflammatory cystitis. Additionally, there has developed punctate intraluminal gas within the bladder lumen non dependently. Together, a colovesicular fistula is not excluded. 3. Mild perinephric inflammatory stranding suggesting an underlying inflammatory process such as pyelonephritis. Correlation with urinalysis and urine culture may be helpful. 4. Complex multiloculated cystic lesion involving the left adnexa, not well characterized on this examination but better delineated since prior exam with improvement in surrounding inflammatory change. This could be better assessed with dedicated pelvic sonography once the patient's acute issues have resolved 5. Cholelithiasis. 6. Mildly progressive, moderate hepatosplenomegaly. Electronically Signed   By: Helyn Numbers M.D.   On: 02/06/2024 23:16   DG Chest Port 1 View Result Date: 02/06/2024 CLINICAL DATA:  Cough and fever. EXAM: PORTABLE CHEST 1 VIEW COMPARISON:  None Available. FINDINGS: The heart is upper normal in size. The cardiomediastinal contours are normal. Subsegmental atelectasis in the right lower lung zone. Pulmonary vasculature is normal. No consolidation, pleural effusion, or pneumothorax. No acute osseous abnormalities are seen. IMPRESSION: Subsegmental atelectasis in the right lower lung zone. Electronically Signed   By: Narda Rutherford M.D.   On: 02/06/2024 21:49     Assessment & Plan:   1. OSA (obstructive sleep apnea) (Primary)  2. Narcolepsy without cataplexy  3. Chronic diastolic CHF (congestive heart failure) (HCC)  Assessment and Plan    Obstructive Sleep Apnea Original sleep study in  2009 showed 93 apneic events per hour, indicating severe apnea.  Recent CPAP download  shows consistent use with an average apnea score of 3.2 events per hour, indicating well-controlled apnea. Current weight loss efforts may improve apnea severity. Discussed potential for reassessment of sleep apnea and oxygen needs upon reaching weight loss goals. - Continue CPAP therapy with current settings (12-20cm h20) - Reassess need for oxygen and repeat sleep study upon reaching weight loss goal  Chronic Respiratory Failure Chronic respiratory failure likely secondary to obesity hypoventilation syndrome, managed with CPAP and supplemental oxygen. Current management appears stable with no acute issues reported. - Continue 3 liters of oxygen at night with CPAP  Narcolepsy Narcolepsy managed with modafinil 300 mg in the morning. Reports breakthrough sleepiness in the afternoon despite medication adherence. Epworth score 8/24. Discussed potential alternative medication, Sunosi, but she prefers to continue current regimen due to effectiveness and concerns about switching medications. Sunosi works on dopamine and norepinephrine to promote wakefulness, but switching may disrupt current stability. - Refill modafinil 300 mg in the morning  Diastolic Heart Failure Diastolic heart failure with previous ejection fraction of 20%, now recovered. Managed with torsemide 120 mg daily, with her adjusting dose based on fluid retention. Recent hospitalization led to fluid overload due to withheld diuretics, now resolved. She reports occasional fluid retention and plans to discuss diuretic regimen with new cardiologist.  - Continue torsemide 120 mg daily, adjust as needed based on fluid retention - Discuss diuretic regimen with new cardiologist, consider Zaroxlyn as needed  Iron Deficiency Anemia Severe iron deficiency anemia with recent hemoglobin of 6.5, managed with iron infusions and a blood transfusion. Hemoglobin improved to 7.2 post-transfusion. Ongoing management with hematologist, with plans for  follow-up lab work. Anemia may contribute to fatigue and hypersomnia.  - Follow up with hematologist for repeat lab work in three weeks  Obesity Obesity with significant weight loss through lifestyle changes and medication. Currently on Ozempic, previously on Mounjaro. Considering bariatric surgery, with plans to submit case to insurance. Discussed potential new medication, Zepbound, for weight loss and sleep apnea. Zepbound is similar to [facility] and approved for obesity with sleep apnea, but insurance coverage is uncertain. - Discuss Zepbound with weight management specialist - Continue current weight loss efforts and medication regimen      Glenford Bayley, NP 02/27/2024

## 2024-02-27 NOTE — Patient Instructions (Addendum)
-  OBSTRUCTIVE SLEEP APNEA: Obstructive sleep apnea is a condition where your breathing stops and starts repeatedly during sleep. Your CPAP therapy is working well, with a significant reduction in apnea events. Continue using your CPAP machine with the current settings. We will reassess your need for oxygen and consider a repeat sleep study once you reach your weight loss goals.  -CHRONIC RESPIRATORY FAILURE: Chronic respiratory failure means your lungs are not getting enough oxygen into your blood. This is managed with CPAP and supplemental oxygen at night. Continue using 3 liters of oxygen at night with your CPAP machine.  -NARCOLEPSY: Narcolepsy is a sleep disorder that causes excessive daytime sleepiness. You are currently managing this with modafinil, which you should continue taking at 300 mg in the morning. We discussed an alternative medication, Sunosi, but you prefer to stay on your current regimen. We provided information on Sunosi for you to review.  -DIASTOLIC HEART FAILURE: Diastolic heart failure occurs when the heart has difficulty relaxing and filling with blood. Your condition has improved, and you are managing it with torsemide. Continue taking 120 mg daily and adjust the dose as needed based on fluid retention. Discuss your diuretic regimen with your new cardiologist and consider the potential addition of Zarolyn as needed.  -IRON DEFICIENCY ANEMIA: Iron deficiency anemia is a condition where you lack enough healthy red blood cells due to insufficient iron. You have been receiving iron infusions and a blood transfusion, which have improved your hemoglobin levels. Follow up with your hematologist for repeat lab work in three weeks.  -OBESITY: Obesity is a condition characterized by excessive body fat. You have made significant progress with weight loss through lifestyle changes and medication. Continue your current weight loss efforts and medication regimen. Discuss the potential new  medication, Zepbound, with your weight management specialist. You are also considering bariatric surgery and are in the process of submitting your case to insurance.  INSTRUCTIONS:  Follow up with your hematologist for repeat lab work in three weeks. Discuss your diuretic regimen with your new cardiologist and consider the potential addition of xeroxilin as needed. Continue your current weight loss efforts and medication regimen, and discuss the potential new medication, Zepbound, with your weight management specialist. We will reassess your need for oxygen and consider a repeat sleep study once you reach your weight loss goals.  Follow-up 6 month virtual visit with Alexandra Beck on Friday afternoon if able

## 2024-02-28 ENCOUNTER — Other Ambulatory Visit: Payer: Self-pay

## 2024-02-28 ENCOUNTER — Other Ambulatory Visit (HOSPITAL_COMMUNITY): Payer: Self-pay

## 2024-03-09 ENCOUNTER — Other Ambulatory Visit (HOSPITAL_COMMUNITY): Payer: Self-pay

## 2024-03-09 ENCOUNTER — Other Ambulatory Visit (HOSPITAL_BASED_OUTPATIENT_CLINIC_OR_DEPARTMENT_OTHER): Payer: Self-pay

## 2024-03-13 ENCOUNTER — Other Ambulatory Visit: Payer: Self-pay

## 2024-03-16 ENCOUNTER — Other Ambulatory Visit (HOSPITAL_COMMUNITY): Payer: Self-pay

## 2024-03-19 ENCOUNTER — Other Ambulatory Visit (HOSPITAL_COMMUNITY): Payer: Self-pay

## 2024-03-20 ENCOUNTER — Ambulatory Visit (INDEPENDENT_AMBULATORY_CARE_PROVIDER_SITE_OTHER): Admitting: Family Medicine

## 2024-03-20 ENCOUNTER — Encounter (INDEPENDENT_AMBULATORY_CARE_PROVIDER_SITE_OTHER): Payer: Self-pay | Admitting: Family Medicine

## 2024-03-20 ENCOUNTER — Ambulatory Visit (INDEPENDENT_AMBULATORY_CARE_PROVIDER_SITE_OTHER): Payer: 59 | Admitting: Psychology

## 2024-03-20 ENCOUNTER — Other Ambulatory Visit (HOSPITAL_COMMUNITY): Payer: Self-pay

## 2024-03-20 VITALS — BP 127/69 | HR 65 | Temp 98.6°F | Ht 63.0 in | Wt 368.0 lb

## 2024-03-20 DIAGNOSIS — E559 Vitamin D deficiency, unspecified: Secondary | ICD-10-CM

## 2024-03-20 DIAGNOSIS — F4310 Post-traumatic stress disorder, unspecified: Secondary | ICD-10-CM | POA: Diagnosis not present

## 2024-03-20 DIAGNOSIS — F331 Major depressive disorder, recurrent, moderate: Secondary | ICD-10-CM | POA: Diagnosis not present

## 2024-03-20 DIAGNOSIS — Z6841 Body Mass Index (BMI) 40.0 and over, adult: Secondary | ICD-10-CM

## 2024-03-20 DIAGNOSIS — F428 Other obsessive-compulsive disorder: Secondary | ICD-10-CM | POA: Diagnosis not present

## 2024-03-20 DIAGNOSIS — D649 Anemia, unspecified: Secondary | ICD-10-CM | POA: Diagnosis not present

## 2024-03-20 MED ORDER — VITAMIN D (ERGOCALCIFEROL) 1.25 MG (50000 UNIT) PO CAPS
50000.0000 [IU] | ORAL_CAPSULE | ORAL | 0 refills | Status: DC
  Filled 2024-03-20: qty 4, 28d supply, fill #0

## 2024-03-20 NOTE — Progress Notes (Signed)
 [facility] Counselor/Therapist Progress Note  Patient ID: Alexandra Beck, MRN: [id number],    Date: 03/20/2024  Time spent:  Time In:  12:02 Time out:1:02  Treatment Type: Individual Therapy  Reported Symptoms: sadness,   Mental Status Exam: Appearance:  Casual     Behavior: Appropriate  Motor: Normal  Speech/Language:  Normal Rate  Affect: blunted  Mood: pleasant  Thought process: normal  Thought content:   WNL  Sensory/Perceptual disturbances:   WNL  Orientation: oriented to person, place, time/date, and situation  Attention: Good  Concentration: Good  Memory: WNL  Fund of knowledge:  Good  Insight:   Good  Judgment:  Good  Impulse Control: Good   Risk Assessment: Danger to Self:  No Self-injurious Behavior: No Danger to Others: No Duty to Warn:no Physical Aggression / Violence:No  Access to Firearms a concern: No  Gang Involvement:No   Subjective: The patient attended a face-to-face individual therapy session today via video visit .  The patient gave verbal consent for the video visit to be on Caregility and the patient is aware of the limitations of telehealth.  The patient was in her home alone and the therapist was in the office.  The patient reports that she has a meeting with the surgeon next Friday.  She is excited about this but also a little apprehensive.  They should schedule her bariatric surgery relatively soon after she meets with the surgeon.  We talked about some of the questions that she has looked up and some of the concerns her mother has.  We also talked about some of the behavioral things that she could expect after having the surgery and talked about the need to just be monitoring her emotional eating and behavioral changes that she is going to have she has done a good job of losing some more weight for her.  She is still going to healthy weight and wellness and will continue to do that until she has the surgery.  She is aware  that it would be a massive change for her when she has the surgery but seems very excited about this.  She is very aware that this is a told that she can utilize but that it could be a problem for her if she does not make some behavioral changes. Interventions: Cognitive Behavioral Therapy, Assertiveness/Communication, and Problem solving, psycho education  Diagnosis:Obsessional thoughts  Major depressive disorder, recurrent episode, moderate (HCC)  PTSD (post-traumatic stress disorder)  Plan: Treatment Plan  Strengths/Abilities:  Intelligent, ability for insight, supportive wife  Treatment Preferences:  Outpatient Individual therapy  Statement of Needs:  "I Need help with my depression and motivation. "  Symptoms:Depressed or irritable mood.:(Status: improved). Diminished interest in or  enjoyment of activities.(Status: improved). Feelings of hopelessness,  worthlessness, or inappropriate guilt.: (Status: improved). History of chronic or recurrent depression for which the client has taken antidepressant medication, been hospitalized, had  outpatient treatment, or had a course of electroconvulsive therapy.(Status:  maintained). Lack of energy.: (Status: maintained). Low self-esteem.:  (Status: improved). Poor concentration and indecisiveness.:  (Status: improved). Social withdrawal.:  (Status: maintained).  Unresolved grief issues.: (Status: maintained).  Problems Addressed:  Unipolar Depression  Goals:  LTG:1. Alleviate depressive symptoms and return to previous level of effective  functioning.  70% 2. Appropriately grieve the loss in order to normalize mood and to return  to previously adaptive level of functioning. 3. Develop healthy interpersonal relationships that lead to the alleviation  and help prevent the  relapse of depression.  70% 4. Develop healthy thinking patterns and beliefs about self, others, and the world that lead to the alleviation and help prevent the relapse  of  Depression  70% STG:1.Describe current and past experiences with depression including their impact on functioning and  attempts to resolve it.  70 % 2. Identify and replace thoughts and beliefs that support depression.  70 % 3.Learn and implement behavioral strategies to overcome depression. 70% 4.Verbalize an understanding of healthy and unhealthy emotions with the intent of increasing the use of  healthy emotions to guide actions.  50 %  Target Date:  10/07/2024 Frequency: Bi Weekly Modality:Individual Interventions by Therapist:  CBT, problem solving, EMDR, insight oriented  Patient approved Treatment Plan  Alexandra Beck Octavio Ben, LCSW

## 2024-03-20 NOTE — Assessment & Plan Note (Addendum)
 Awaiting scheduling for upcoming bariatric surgery.  With her current loss she has lost 100lbs since starting [facility] program.  She will continue following up here for long term success.  Will plan to see her back in 8 weeks.  Anthropometric Measurements Height: 5\' 3"  (1.6 m) Weight: (!) 368 lb (166.9 kg) BMI (Calculated): 65.2 Weight at Last Visit: 376 lb Weight Lost Since Last Visit: 8 Weight Gained Since Last Visit: 0 Starting Weight: 468 lb Total Weight Loss (lbs): 100 lb (45.4 kg) Body Composition  Body Fat %: 60 % Fat Mass (lbs): 220.8 lbs Muscle Mass (lbs): 139.8 lbs Visceral Fat Rating : 27 Other Clinical Data Today's Visit #: 79 Starting Date: 04/17/18 Comments: 1800-2000/150

## 2024-03-20 NOTE — Progress Notes (Signed)
 SUBJECTIVE:  Chief Complaint: Obesity  Interim History: Patient hospitalized a day after her last appointment due to sepsis from pyelonephritis.  She was found to have severe anemia and require blood transfusion and then got 3 iron  infusions.  Has follow up with surgeon next week to hopefully get get bariatric surgery date.    Alexandra Beck is here to discuss her progress with her obesity treatment plan. She is on the keeping a food journal and adhering to recommended goals of 1800-2000 calories and 150 grams of protein and states she is following her eating plan approximately 100 % of the time. She states she is not exercising.   OBJECTIVE: Visit Diagnoses: Problem List Items Addressed This Visit       Other   Morbid obesity [facility])   Awaiting scheduling for upcoming bariatric surgery.  With her current loss she has lost 100lbs since starting [facility] program.  She will continue following up here for long term success.  Will plan to see her back in 8 weeks.  Anthropometric Measurements Height: 5\' 3"  (1.6 m) Weight: (!) 368 lb (166.9 kg) BMI (Calculated): 65.2 Weight at Last Visit: 376 lb Weight Lost Since Last Visit: 8 Weight Gained Since Last Visit: 0 Starting Weight: 468 lb Total Weight Loss (lbs): 100 lb (45.4 kg) Body Composition  Body Fat %: 60 % Fat Mass (lbs): 220.8 lbs Muscle Mass (lbs): 139.8 lbs Visceral Fat Rating : 27 Other Clinical Data Today's Visit #: 57 Starting Date: 04/17/18 Comments: 1800-2000/150       Vitamin D  deficiency   Needs refill of Vitamin D  today.  No nausea, vomiting and muscle weakness.  Will get repeat labs at time of next appointment.      Relevant Medications   Vitamin D , Ergocalciferol , (DRISDOL ) 1.25 MG (50000 UNIT) CAPS capsule   Anemia - Primary   Significant iron  deficiency and anemia at inpatient stay in March.  She received one unit PRBC and 3 iron  infusions.  Recent CBC showing H/H of 9.7/32.2, MCV of 80.  Continue MVI in prep for  upcoming bariatric surgery and iron  deficiency.      Other Visit Diagnoses       BMI 60.0-69.9, adult (HCC)           No data recorded      03/20/2024    1:00 PM 02/27/2024    3:10 PM 02/19/2024    5:02 PM  Vitals with BMI  Height 5\' 3"  5' 2.5"   Weight 368 lbs 378 lbs 3 oz   BMI 65.2 68.03   Systolic 127 138 409  Diastolic 69 64 73  Pulse 65 68 78       ASSESSMENT AND PLAN:  Diet: Alexandra Beck is currently in the action stage of change. As such, her goal is to continue with weight loss efforts and has agreed to keeping a food journal and adhering to recommended goals of 1800-2000 calories and 150 or more grams protein daily.   Exercise:  For substantial health benefits, adults should do at least 150 minutes (2 hours and 30 minutes) a week of moderate-intensity, or 75 minutes (1 hour and 15 minutes) a week of vigorous-intensity aerobic physical activity, or an equivalent combination of moderate- and vigorous-intensity aerobic activity. Aerobic activity should be performed in episodes of at least 10 minutes, and preferably, it should be spread throughout the week.  Behavior Modification:  We discussed the following Behavioral Modification Strategies today: increasing lean protein intake, decreasing simple carbohydrates, meal planning  and cooking strategies, keeping healthy foods in the home, and holiday eating strategies.   Return in about 8 weeks (around 05/15/2024).Alexandra Beck She was informed of the importance of frequent follow up visits to maximize her success with intensive lifestyle modifications for her multiple health conditions.  Attestation Statements:   Reviewed by clinician on day of visit: allergies, medications, problem list, medical history, surgical history, family history, social history, and previous encounter notes.     Alexandra Frizzle, MD

## 2024-03-20 NOTE — Assessment & Plan Note (Signed)
 Significant iron  deficiency and anemia at inpatient stay in March.  She received one unit PRBC and 3 iron  infusions.  Recent CBC showing H/H of 9.7/32.2, MCV of 80.  Continue MVI in prep for upcoming bariatric surgery and iron  deficiency.

## 2024-03-22 ENCOUNTER — Other Ambulatory Visit: Payer: Self-pay | Admitting: Primary Care

## 2024-03-22 ENCOUNTER — Other Ambulatory Visit: Payer: Self-pay

## 2024-03-22 ENCOUNTER — Other Ambulatory Visit (HOSPITAL_COMMUNITY): Payer: Self-pay

## 2024-03-22 MED ORDER — MODAFINIL 100 MG PO TABS
100.0000 mg | ORAL_TABLET | Freq: Every day | ORAL | 3 refills | Status: DC
  Filled 2024-03-22: qty 30, 30d supply, fill #0
  Filled 2024-05-06 – 2024-05-08 (×2): qty 30, 30d supply, fill #1
  Filled 2024-06-05: qty 30, 30d supply, fill #2
  Filled 2024-07-24: qty 30, 30d supply, fill #3

## 2024-03-22 MED ORDER — MODAFINIL 200 MG PO TABS
200.0000 mg | ORAL_TABLET | Freq: Every day | ORAL | 3 refills | Status: DC
  Filled 2024-03-22 – 2024-04-17 (×3): qty 30, 30d supply, fill #0
  Filled 2024-05-23 (×2): qty 30, 30d supply, fill #1
  Filled 2024-07-24: qty 30, 30d supply, fill #2
  Filled 2024-09-12: qty 30, 30d supply, fill #3

## 2024-03-22 NOTE — Telephone Encounter (Signed)
 Clarified Sig for Modafinil 

## 2024-03-24 NOTE — Assessment & Plan Note (Signed)
 Needs refill of Vitamin D  today.  No nausea, vomiting and muscle weakness.  Will get repeat labs at time of next appointment.

## 2024-03-26 ENCOUNTER — Other Ambulatory Visit: Payer: Self-pay

## 2024-03-27 ENCOUNTER — Ambulatory Visit: Payer: BC Managed Care – PPO | Admitting: Psychology

## 2024-04-01 ENCOUNTER — Other Ambulatory Visit (HOSPITAL_COMMUNITY): Payer: Self-pay

## 2024-04-01 ENCOUNTER — Other Ambulatory Visit: Payer: Self-pay

## 2024-04-02 ENCOUNTER — Other Ambulatory Visit: Payer: Self-pay

## 2024-04-10 ENCOUNTER — Other Ambulatory Visit: Payer: Self-pay

## 2024-04-10 ENCOUNTER — Ambulatory Visit (INDEPENDENT_AMBULATORY_CARE_PROVIDER_SITE_OTHER): Payer: Self-pay | Admitting: Psychology

## 2024-04-10 DIAGNOSIS — F428 Other obsessive-compulsive disorder: Secondary | ICD-10-CM | POA: Diagnosis not present

## 2024-04-10 DIAGNOSIS — F4310 Post-traumatic stress disorder, unspecified: Secondary | ICD-10-CM

## 2024-04-10 DIAGNOSIS — F331 Major depressive disorder, recurrent, moderate: Secondary | ICD-10-CM

## 2024-04-10 NOTE — Progress Notes (Signed)
 [facility] Counselor/Therapist Progress Note  Patient ID: CHALESE WRUBEL, MRN: [id number],    Date: 04/10/2024  Time spent: 63 minutes  Time In:  1:02 Time out:2:05  Treatment Type: Individual Therapy  Reported Symptoms: sadness,   Mental Status Exam: Appearance:  Casual     Behavior: Appropriate  Motor: Normal  Speech/Language:  Normal Rate  Affect: blunted  Mood: pleasant  Thought process: normal  Thought content:   WNL  Sensory/Perceptual disturbances:   WNL  Orientation: oriented to person, place, time/date, and situation  Attention: Good  Concentration: Good  Memory: WNL  Fund of knowledge:  Good  Insight:   Good  Judgment:  Good  Impulse Control: Good   Risk Assessment: Danger to Self:  No Self-injurious Behavior: No Danger to Others: No Duty to Warn:no Physical Aggression / Violence:No  Access to Firearms a concern: No  Gang Involvement:No   Subjective: The patient attended a face-to-face individual therapy session today via video visit .  The patient gave verbal consent for the video visit to be on Caregility and the patient is aware of the limitations of telehealth.  The patient was in her home alone and the therapist was in the office.  The patient reports that the meeting with the doctor went very well.  She states that her surgery is scheduled for mid July.  We talked about how she is feeling about things and she seems excited about moving forward.  She seems to be doing a better job of taking care of herself and she seems much more stable emotionally than she has in the past.  We talked about how her partner is handling it and talked about some of the things that could potentially cause issues in their relationship when she has the surgery and loses the weight.  The patient is doing relatively well and we will continue to work with her on coping strategies to deal with the changes that she is about to embark on.  Interventions: Cognitive Behavioral  Therapy, Assertiveness/Communication, and Problem solving, psycho education  Diagnosis:Obsessional thoughts  Major depressive disorder, recurrent episode, moderate (HCC)  PTSD (post-traumatic stress disorder)  Plan: Treatment Plan  Strengths/Abilities:  Intelligent, ability for insight, supportive wife  Treatment Preferences:  Outpatient Individual therapy  Statement of Needs:  "I Need help with my depression and motivation. "  Symptoms:Depressed or irritable mood.:(Status: improved). Diminished interest in or  enjoyment of activities.(Status: improved). Feelings of hopelessness,  worthlessness, or inappropriate guilt.: (Status: improved). History of chronic or recurrent depression for which the client has taken antidepressant medication, been hospitalized, had  outpatient treatment, or had a course of electroconvulsive therapy.(Status:  maintained). Lack of energy.: (Status: maintained). Low self-esteem.:  (Status: improved). Poor concentration and indecisiveness.:  (Status: improved). Social withdrawal.:  (Status: maintained).  Unresolved grief issues.: (Status: maintained).  Problems Addressed:  Unipolar Depression  Goals:  LTG:1. Alleviate depressive symptoms and return to previous level of effective  functioning.  70% 2. Appropriately grieve the loss in order to normalize mood and to return  to previously adaptive level of functioning. 3. Develop healthy interpersonal relationships that lead to the alleviation  and help prevent the relapse of depression.  70% 4. Develop healthy thinking patterns and beliefs about self, others, and the world that lead to the alleviation and help prevent the relapse of  Depression  70% STG:1.Describe current and past experiences with depression including their impact on functioning and  attempts to resolve it.  70 % 2. Identify  and replace thoughts and beliefs that support depression.  70 % 3.Learn and implement behavioral strategies to  overcome depression. 70% 4.Verbalize an understanding of healthy and unhealthy emotions with the intent of increasing the use of  healthy emotions to guide actions.  50 %  Target Date:  10/07/2024 Frequency: Bi Weekly Modality:Individual Interventions by Therapist:  CBT, problem solving, EMDR, insight oriented  Patient approved Treatment Plan  Sherian Valenza Octavio Ben, LCSW

## 2024-04-12 ENCOUNTER — Ambulatory Visit (HOSPITAL_BASED_OUTPATIENT_CLINIC_OR_DEPARTMENT_OTHER): Payer: Self-pay | Admitting: Cardiology

## 2024-04-12 ENCOUNTER — Other Ambulatory Visit (HOSPITAL_COMMUNITY): Payer: Self-pay

## 2024-04-15 ENCOUNTER — Other Ambulatory Visit (HOSPITAL_COMMUNITY): Payer: Self-pay

## 2024-04-17 ENCOUNTER — Encounter (INDEPENDENT_AMBULATORY_CARE_PROVIDER_SITE_OTHER): Payer: Self-pay | Admitting: Family Medicine

## 2024-04-17 ENCOUNTER — Other Ambulatory Visit (INDEPENDENT_AMBULATORY_CARE_PROVIDER_SITE_OTHER): Payer: Self-pay

## 2024-04-17 ENCOUNTER — Other Ambulatory Visit (HOSPITAL_COMMUNITY): Payer: Self-pay

## 2024-04-17 DIAGNOSIS — E1165 Type 2 diabetes mellitus with hyperglycemia: Secondary | ICD-10-CM

## 2024-04-17 MED ORDER — SEMAGLUTIDE (2 MG/DOSE) 8 MG/3ML ~~LOC~~ SOPN
2.0000 mg | PEN_INJECTOR | SUBCUTANEOUS | 0 refills | Status: DC
  Filled 2024-04-17: qty 3, 28d supply, fill #0

## 2024-04-24 ENCOUNTER — Ambulatory Visit (INDEPENDENT_AMBULATORY_CARE_PROVIDER_SITE_OTHER): Payer: Self-pay | Admitting: Psychology

## 2024-04-24 DIAGNOSIS — F4310 Post-traumatic stress disorder, unspecified: Secondary | ICD-10-CM

## 2024-04-24 DIAGNOSIS — F428 Other obsessive-compulsive disorder: Secondary | ICD-10-CM

## 2024-04-24 DIAGNOSIS — F331 Major depressive disorder, recurrent, moderate: Secondary | ICD-10-CM

## 2024-04-24 NOTE — Progress Notes (Unsigned)
                edge,  experiencing concentration difficulties, having trouble falling or staying asleep, exhibiting a general  state of irritability).: No Description Entered (Status: improved). Motor tension (e.g., restlessness,  tiredness, shakiness, muscle tension).: No Description Entered (Status: improved).  Problems Addressed  Anxiety, Phase Of Life Problems, Anxiety  Goals 1. Learn and implement coping skills that result in a reduction of anxiety  and worry, and improved daily functioning. Objective Learn  and implement calming skills to reduce overall anxiety and manage anxiety symptoms. Target Date: 2025-08-09Frequency: Weekly Progress: 40 Modality: individual  Related Interventions 1. Teach the client calming/relaxation skills (e.g., applied relaxation, progressive muscle  relaxation, cue controlled relaxation; mindful breathing; biofeedback) and how to discriminate  better between relaxation and tension; teach the client how to apply these skills to his/her daily  life (e.g., New Directions in Progressive Muscle Relaxation by Marcelyn Ditty, and  Hazlett-Stevens; Treating Generalized Anxiety Disorder by Rygh and Ida Rogue). Objective Identify, challenge, and replace biased, fearful self-talk with positive, realistic, and empowering selftalk. Target Date: 2024-07-06 Frequency: weekly Progress: 30 Modality: individual Related Interventions 1. Explore the client's schema and self-talk that mediate his/her fear response; assist him/her in  challenging the biases; replace the distorted messages with reality-based alternatives and  positive, realistic self-talk that will increase his/her self-confidence in coping with irrational  fears (see Cognitive Therapy of Anxiety Disorders by Laurence Slate). Objective Learn and implement problem-solving strategies for realistically addressing worries. Target Date: 2025-08-09Frequency: weekly Progress: 40 Modality: individual 2. Resolve conflicted feelings and adapt to the new life circumstances. Objective Apply problem-solving skills to current circumstances. Target Date: 2024-07-06 Frequency: weekly Progress: 20 Modality: individual Related Interventions 1. Teach the client problem-resolution skills (e.g., defining the problem clearly, brainstorming  multiple solutions, listing the pros and cons of each solution, seeking input from others,  selecting and implementing a plan of action, evaluating outcome, and readjusting plan as   necessary).   3. Stabilize anxiety level while increasing ability to function on a daily  basis. 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  The patient approved this plan.   Deonna Krummel G Ethridge Sollenberger, LCSW

## 2024-05-06 ENCOUNTER — Other Ambulatory Visit (HOSPITAL_COMMUNITY): Payer: Self-pay

## 2024-05-08 ENCOUNTER — Other Ambulatory Visit (HOSPITAL_COMMUNITY): Payer: Self-pay

## 2024-05-08 ENCOUNTER — Ambulatory Visit: Admitting: Psychology

## 2024-05-08 DIAGNOSIS — F4310 Post-traumatic stress disorder, unspecified: Secondary | ICD-10-CM

## 2024-05-08 DIAGNOSIS — F331 Major depressive disorder, recurrent, moderate: Secondary | ICD-10-CM

## 2024-05-08 DIAGNOSIS — F4323 Adjustment disorder with mixed anxiety and depressed mood: Secondary | ICD-10-CM

## 2024-05-08 DIAGNOSIS — F428 Other obsessive-compulsive disorder: Secondary | ICD-10-CM | POA: Diagnosis not present

## 2024-05-08 DIAGNOSIS — F3341 Major depressive disorder, recurrent, in partial remission: Secondary | ICD-10-CM

## 2024-05-08 NOTE — Progress Notes (Addendum)
 [facility] Counselor/Therapist Progress Note  Patient ID: Alexandra Beck, MRN: [id number],    Date: 05/08/2024  Time spent: 60 minutes  Time In:  1:05 Time out:2:05  Treatment Type: Individual Therapy  Reported Symptoms: sadness,   Mental Status Exam: Appearance:  Casual     Behavior: Appropriate  Motor: Normal  Speech/Language:  Normal Rate  Affect: blunted  Mood: sad  Thought process: normal  Thought content:   WNL  Sensory/Perceptual disturbances:   WNL  Orientation: oriented to person, place, time/date, and situation  Attention: Good  Concentration: Good  Memory: WNL  Fund of knowledge:  Good  Insight:   Good  Judgment:  Good  Impulse Control: Good   Risk Assessment: Danger to Self:  No Self-injurious Behavior: No Danger to Others: No Duty to Warn:no Physical Aggression / Violence:No  Access to Firearms a concern: No  Gang Involvement:No   Subjective: The patient attended a face-to-face individual therapy session today via video visit .  The patient gave verbal consent for the video visit to be on Caregility and the patient is aware of the limitations of telehealth.  The patient was in her home alone and the therapist was in the office.  The patient's mood was a little labile today.  The patient reports that her surgery is on July 16 and is a little over a month way.  She states that she is not sure how it is going to be and does have some anxiety about it.  One of the things that we talked about specifically was how she perceives herself as a fat girl.  We talked about that being a part of her identity and that that may be something we need to work with her on moving forward.  I think in some ways she may be grieving the loss of one of the way she has coped in life and she may be grieving the loss of that part of her identity that was a part of her for so long.  In addition I explained to her that she is going to be a little emotionally labile just  because as she loses the fat after the surgery she will also be losing estrogen which is stored in the fat cells which causes more emotional lability.  We talked about a situation also that happened with her wife and I think there is some concerns that she has about their intimacy and sex life because of her health and how this is going to affect their intimacy moving forward.  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  She brought up again the possibility of us  doing more regular sessions as we had talked about that previously and I do think that might be a good idea while she is in the process of losing weight as she will have some emotional struggles that are specifically related to weight loss surgery. Interventions: Cognitive Behavioral Therapy, Assertiveness/Communication, and Problem solving, psycho education  Diagnosis:Obsessional thoughts  Adjustment disorder with mixed anxiety and depressed mood  Major depressive disorder, recurrent episode, in partial remission (HCC)  Major depressive disorder, recurrent episode, moderate (HCC)  PTSD (post-traumatic stress disorder)  Plan: Treatment Plan  Strengths/Abilities:  Intelligent, ability for insight, supportive wife  Treatment Preferences:  Outpatient Individual therapy  Statement of Needs:  I Need help with my depression and motivation.   Symptoms:Depressed or irritable mood.:(Status: improved). Diminished interest in or  enjoyment of  activities.(Status: improved). Feelings of hopelessness,  worthlessness, or inappropriate guilt.: (Status: improved). History of chronic or recurrent depression for which the client has taken antidepressant medication, been hospitalized, had  outpatient treatment, or had a course of electroconvulsive therapy.(Status:  maintained). Lack of energy.: (Status: maintained). Low self-esteem.:  (Status: improved). Poor concentration and  indecisiveness.:  (Status: improved). Social withdrawal.:  (Status: maintained).  Unresolved grief issues.: (Status: maintained).  Problems Addressed:  Unipolar Depression  Goals:  LTG:1. Alleviate depressive symptoms and return to previous level of effective  functioning.  70% 2. Appropriately grieve the loss in order to normalize mood and to return  to previously adaptive level of functioning. 3. Develop healthy interpersonal relationships that lead to the alleviation  and help prevent the relapse of depression.  70% 4. Develop healthy thinking patterns and beliefs about self, others, and the world that lead to the alleviation and help prevent the relapse of  Depression  70% STG:1.Describe current and past experiences with depression including their impact on functioning and  attempts to resolve it.  70 % 2. Identify and replace thoughts and beliefs that support depression.  70 % 3.Learn and implement behavioral strategies to overcome depression. 70% 4.Verbalize an understanding of healthy and unhealthy emotions with the intent of increasing the use of  healthy emotions to guide actions.  50 %  Target Date:  10/07/2024 Frequency: Bi Weekly Modality:Individual Interventions by Therapist:  CBT, problem solving, EMDR, insight oriented  Patient approved Treatment Plan  Chaunce Winkels [name], LCSW

## 2024-05-13 ENCOUNTER — Encounter (INDEPENDENT_AMBULATORY_CARE_PROVIDER_SITE_OTHER): Payer: Self-pay | Admitting: Family Medicine

## 2024-05-13 ENCOUNTER — Other Ambulatory Visit: Payer: Self-pay

## 2024-05-13 ENCOUNTER — Ambulatory Visit (INDEPENDENT_AMBULATORY_CARE_PROVIDER_SITE_OTHER): Admitting: Family Medicine

## 2024-05-13 DIAGNOSIS — Z7985 Long-term (current) use of injectable non-insulin antidiabetic drugs: Secondary | ICD-10-CM

## 2024-05-13 DIAGNOSIS — E559 Vitamin D deficiency, unspecified: Secondary | ICD-10-CM | POA: Diagnosis not present

## 2024-05-13 DIAGNOSIS — E119 Type 2 diabetes mellitus without complications: Secondary | ICD-10-CM | POA: Diagnosis not present

## 2024-05-13 DIAGNOSIS — Z6841 Body Mass Index (BMI) 40.0 and over, adult: Secondary | ICD-10-CM

## 2024-05-13 DIAGNOSIS — E1165 Type 2 diabetes mellitus with hyperglycemia: Secondary | ICD-10-CM

## 2024-05-13 MED ORDER — SEMAGLUTIDE (2 MG/DOSE) 8 MG/3ML ~~LOC~~ SOPN
2.0000 mg | PEN_INJECTOR | SUBCUTANEOUS | 0 refills | Status: DC
  Filled 2024-05-13: qty 3, 28d supply, fill #0

## 2024-05-13 MED ORDER — VITAMIN D (ERGOCALCIFEROL) 1.25 MG (50000 UNIT) PO CAPS
50000.0000 [IU] | ORAL_CAPSULE | ORAL | 0 refills | Status: DC
  Filled 2024-05-13: qty 4, 28d supply, fill #0

## 2024-05-13 NOTE — Assessment & Plan Note (Addendum)
 Patient unclear what her doctors advice will be in terms of medication cessation prior to surgery.  Will follow up on starting medication after bariatric surgery at next appointment.  Refill sent into pharmacy today.

## 2024-05-13 NOTE — Assessment & Plan Note (Signed)
 Patient needs refill of Vitamin D  today.  No nausea, vomiting or muscle weakness mentioned.  Will refill Vitamin D  today.

## 2024-05-13 NOTE — Progress Notes (Signed)
 SUBJECTIVE:  Chief Complaint: Obesity  Interim History: Patient here for follow up since April 23.  She has gastric bypass scheduled on July 16th.  She is a bit apprehensive but is overall feeling well.  Last week she had significant ankle swelling and did take two aleve to help with inflammation.   She is interested in the high protein bariatric cookbook.  She has her final preop appointment in 4 days where she will find out what medications she will have to crush and what her liver reduction diet will be as well as what her immediate post op recipe will be.   Tahisha is here to discuss her progress with her obesity treatment plan. She is on the keeping a food journal and adhering to recommended goals of 1800-2000 calories and 150 grams of protein and states she is following her eating plan approximately 80 % of the time. She states she is walking some.  OBJECTIVE: Visit Diagnoses: Problem List Items Addressed This Visit       Endocrine   Diabetes mellitus [facility])   Patient unclear what her doctors advice will be in terms of medication cessation prior to surgery.  Will follow up on starting medication after bariatric surgery at next appointment.  Refill sent into pharmacy today.      Relevant Medications   Semaglutide , 2 MG/DOSE, 8 MG/3ML SOPN     Other   Morbid obesity (HCC) - Primary   Relevant Medications   Semaglutide , 2 MG/DOSE, 8 MG/3ML SOPN   Vitamin D  deficiency   Patient needs refill of Vitamin D  today.  No nausea, vomiting or muscle weakness mentioned.  Will refill Vitamin D  today.       Relevant Medications   Vitamin D , Ergocalciferol , (DRISDOL ) 1.25 MG (50000 UNIT) CAPS capsule   Other Visit Diagnoses       BMI 60.0-69.9, adult (HCC)       Relevant Medications   Semaglutide , 2 MG/DOSE, 8 MG/3ML SOPN       Vitals Temp: 99.2 F (37.3 C) BP: (!) 98/58 Pulse Rate: 71 SpO2: 98 %   Anthropometric Measurements Height: 5' 3 (1.6 m) Weight: (!) 362 lb (164.2  kg) BMI (Calculated): 64.14 Weight at Last Visit: 368 lb Weight Lost Since Last Visit: 6 lb Starting Weight: 468 lb Total Weight Loss (lbs): 106 lb (48.1 kg)   Body Composition  Body Fat %: 59.8 % Fat Mass (lbs): 217 lbs Muscle Mass (lbs): 138.4 lbs Visceral Fat Rating : 27   Other Clinical Data Today's Visit #: 80 Starting Date: 04/17/18 Comments: 1800-2000/150     ASSESSMENT AND PLAN:  Diet: Reniya is currently in the action stage of change. As such, her goal is to continue with weight loss efforts and has agreed to practicing portion control and making smarter food choices, such as increasing vegetables and decreasing simple carbohydrates meal plan given to her by bariatric surgery program- liver shrinking diet.   Exercise:  All adults should avoid inactivity. Some activity is better than none, and adults who participate in any amount of physical activity, gain some health benefits.  Behavior Modification:  We discussed the following Behavioral Modification Strategies today: increasing lean protein intake, decreasing simple carbohydrates, increasing vegetables, and planning for success.   Return in about 9 weeks (around 07/15/2024).   She was informed of the importance of frequent follow up visits to maximize her success with intensive lifestyle modifications for her multiple health conditions.  Attestation Statements:   Reviewed by clinician  on day of visit: allergies, medications, problem list, medical history, surgical history, family history, social history, and previous encounter notes.     Donaciano Frizzle, MD

## 2024-05-15 NOTE — Addendum Note (Signed)
 Addended by: Beatrice Lin on: 05/15/2024 01:21 PM   Modules accepted: Level of Service

## 2024-05-22 ENCOUNTER — Ambulatory Visit: Admitting: Psychology

## 2024-05-22 DIAGNOSIS — F3341 Major depressive disorder, recurrent, in partial remission: Secondary | ICD-10-CM | POA: Diagnosis not present

## 2024-05-22 DIAGNOSIS — F4310 Post-traumatic stress disorder, unspecified: Secondary | ICD-10-CM

## 2024-05-22 DIAGNOSIS — F4323 Adjustment disorder with mixed anxiety and depressed mood: Secondary | ICD-10-CM | POA: Diagnosis not present

## 2024-05-22 DIAGNOSIS — F428 Other obsessive-compulsive disorder: Secondary | ICD-10-CM

## 2024-05-22 NOTE — Progress Notes (Unsigned)
 [facility] Counselor/Therapist Progress Note  Patient ID: Alexandra Beck, MRN: [id number],    Date: 05/22/2024  Time spent: 57 minutes  Time In:  1:04 Time out:2:01  Treatment Type: Individual Therapy  Reported Symptoms: sadness,   Mental Status Exam: Appearance:  Casual     Behavior: Appropriate  Motor: Normal  Speech/Language:  Normal Rate  Affect: blunted  Mood: pleasant  Thought process: normal  Thought content:   WNL  Sensory/Perceptual disturbances:   WNL  Orientation: oriented to person, place, time/date, and situation  Attention: Good  Concentration: Good  Memory: WNL  Fund of knowledge:  Good  Insight:   Good  Judgment:  Good  Impulse Control: Good   Risk Assessment: Danger to Self:  No Self-injurious Behavior: No Danger to Others: No Duty to Warn:no Physical Aggression / Violence:No  Access to Firearms a concern: No  Gang Involvement:No   Subjective: The patient attended a face-to-face individual therapy session today via video visit .  The patient gave verbal consent for the video visit to be on Caregility and the patient is aware of the limitations of telehealth.  The patient was in her home alone and the therapist was in the office.   The patient presents as pleasant and cooperative.  The patient states that she started her liver cleanse diet today.  She reports that she is anxious to have the surgery in a few weeks and she is feeling good about moving forward.  We talked about the way she is going to manage the next few weeks with the liver cleanse diet and I encouraged her to notice how she feels and to make sure that she is doing good things for herself in other ways.  Interventions: Cognitive Behavioral Therapy, Assertiveness/Communication, and Problem solving, psycho education  Diagnosis:Obsessional thoughts  Adjustment disorder with mixed anxiety and depressed mood  Major depressive disorder, recurrent episode, in partial remission  (HCC)  PTSD (post-traumatic stress disorder)  Plan: Treatment Plan  Strengths/Abilities:  Intelligent, ability for insight, supportive wife  Treatment Preferences:  Outpatient Individual therapy  Statement of Needs:  I Need help with my depression and motivation.   Symptoms:Depressed or irritable mood.:(Status: improved). Diminished interest in or  enjoyment of activities.(Status: improved). Feelings of hopelessness,  worthlessness, or inappropriate guilt.: (Status: improved). History of chronic or recurrent depression for which the client has taken antidepressant medication, been hospitalized, had  outpatient treatment, or had a course of electroconvulsive therapy.(Status:  maintained). Lack of energy.: (Status: maintained). Low self-esteem.:  (Status: improved). Poor concentration and indecisiveness.:  (Status: improved). Social withdrawal.:  (Status: maintained).  Unresolved grief issues.: (Status: maintained).  Problems Addressed:  Unipolar Depression  Goals:  LTG:1. Alleviate depressive symptoms and return to previous level of effective  functioning.  70% 2. Appropriately grieve the loss in order to normalize mood and to return  to previously adaptive level of functioning. 3. Develop healthy interpersonal relationships that lead to the alleviation  and help prevent the relapse of depression.  70% 4. Develop healthy thinking patterns and beliefs about self, others, and the world that lead to the alleviation and help prevent the relapse of  Depression  70% STG:1.Describe current and past experiences with depression including their impact on functioning and  attempts to resolve it.  70 % 2. Identify and replace thoughts and beliefs that support depression.  70 % 3.Learn and implement behavioral strategies to overcome depression. 70% 4.Verbalize an understanding of healthy and unhealthy emotions with the intent of increasing  the use of  healthy emotions to guide actions.  50  %  Target Date:  10/07/2024 Frequency: Bi Weekly Modality:Individual Interventions by Therapist:  CBT, problem solving, EMDR, insight oriented  Patient approved Treatment Plan  Daron Stutz [name], LCSW

## 2024-05-23 ENCOUNTER — Other Ambulatory Visit (HOSPITAL_COMMUNITY): Payer: Self-pay

## 2024-05-25 ENCOUNTER — Other Ambulatory Visit (HOSPITAL_COMMUNITY): Payer: Self-pay

## 2024-06-05 ENCOUNTER — Ambulatory Visit (INDEPENDENT_AMBULATORY_CARE_PROVIDER_SITE_OTHER): Admitting: Psychology

## 2024-06-05 ENCOUNTER — Other Ambulatory Visit: Payer: Self-pay | Admitting: Adult Health

## 2024-06-05 DIAGNOSIS — F4310 Post-traumatic stress disorder, unspecified: Secondary | ICD-10-CM | POA: Diagnosis not present

## 2024-06-05 DIAGNOSIS — F428 Other obsessive-compulsive disorder: Secondary | ICD-10-CM

## 2024-06-05 DIAGNOSIS — F331 Major depressive disorder, recurrent, moderate: Secondary | ICD-10-CM

## 2024-06-05 DIAGNOSIS — F422 Mixed obsessional thoughts and acts: Secondary | ICD-10-CM

## 2024-06-05 DIAGNOSIS — F3341 Major depressive disorder, recurrent, in partial remission: Secondary | ICD-10-CM | POA: Diagnosis not present

## 2024-06-05 MED ORDER — LAMOTRIGINE 200 MG PO TABS
200.0000 mg | ORAL_TABLET | Freq: Every day | ORAL | 0 refills | Status: DC
  Filled 2024-06-05: qty 30, 30d supply, fill #0
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
## 2024-06-06 ENCOUNTER — Other Ambulatory Visit (HOSPITAL_COMMUNITY): Payer: Self-pay

## 2024-06-06 ENCOUNTER — Other Ambulatory Visit: Payer: Self-pay

## 2024-06-08 ENCOUNTER — Encounter: Payer: Self-pay | Admitting: Internal Medicine

## 2024-06-10 ENCOUNTER — Inpatient Hospital Stay: Admitting: Hematology and Oncology

## 2024-06-10 ENCOUNTER — Other Ambulatory Visit (HOSPITAL_COMMUNITY): Payer: Self-pay

## 2024-06-10 ENCOUNTER — Other Ambulatory Visit: Payer: Self-pay

## 2024-06-10 ENCOUNTER — Inpatient Hospital Stay

## 2024-06-10 MED ORDER — METOPROLOL TARTRATE 50 MG PO TABS
50.0000 mg | ORAL_TABLET | Freq: Two times a day (BID) | ORAL | 3 refills | Status: DC
  Filled 2024-06-10: qty 180, 90d supply, fill #0

## 2024-06-12 HISTORY — PX: GASTRIC BYPASS: SHX52

## 2024-06-14 NOTE — Discharge Summary (Signed)
 Discharge Summary  Admit date: 06/12/2024  Discharge date and time: 06/14/2024   Discharge to:  Home  Discharge Service: Cleopatra Berke [facility] [phone])  Discharge Attending Physician: Annitta Herminio Georgi, MD  Discharge  Diagnoses: Morbid obesity  Secondary Diagnosis: Principal Problem:   Morbid obesity with BMI of 60.0-69.9, adult (CMS-HCC) (POA: Not Applicable) Resolved Problems:   * No resolved hospital problems. *   OR Procedures:   LAPAROSCOPY, SURG, GASTRIC RESTRICT PROC; W/GASTRIC BYPASS & ROUX-EN-Y GASTROENTEROS(ROUX LIMB 150 CM/LESS) Bilateral - TRANSVERSUS ABDOMINIS PLANE (TAP) BLOCK (ABDOMINAL PLANE BLOCK, RECTUS SHEATH BLOCK) BILATERAL; BY INJECTIONS (INCLUDES IMAGING GUIDANCE, WHEN PERFORMED) Midline - UGI ENDO, INCLUDE ESOPHAGUS, STOMACH, & DUODENUM &/OR JEJUNUM; DX W/WO COLLECTION SPECIMN, BY BRUSH OR WASH Date 06/12/2024 -------------------   Ancillary Procedures: no procedures  Discharge Day Services:   Subjective  No acute events overnight. Pain Controlled. No fever or chills.  Objective  Patient Vitals for the past 8 hrs:  BP Temp Temp src Pulse SpO2 Pulse Resp SpO2  06/14/24 1220 114/78 37.1 C (98.8 F) Oral 67 64 17 92 %  06/14/24 0745 127/56 37.1 C (98.8 F) Oral 64 66 29 97 %   I/O this shift: In: 478.7 [P.O.:300; IV Piggyback:178.7] Out: -   General: Cooperative, no distress, well appearing female Head: Normocephalic, atraumatic Pulmonary: Normal work of breathing, equal bilateral chest rise Abdomen: Soft, non-distended, non-tender. Incisions c/d/I Skin: No jaundice, rashes, erythema, or lesions. Skin color and texture normal. Neurologic: Alert and interactive, no motor abnormalities noted.   Hospital Course:  Alexandra Beck is a 43 y.o. female with history of HTN, HFpEF, T2DM, OSA, sliding hiatal hernia, and morbid obesity (BMI 68.8) admitted on 06/12/2024 for Roux-en-Y gastric bypass surgery  S/p Roux-en-Y gastric bypass surgery The patient was taken  to the OR on 7/16 for a laparoscopic Roux-en-Y gastric bypass.  She tolerated the procedure well, was extubated in the OR, and was taken to the PACU where she received routine postoperative care before being transferred to the floor. She did well postoperatively and her diet was advanced per protocol to bariatric full liquid diet. At the time of discharge, sh was able to void spontaneously, have her pain controlled with liquid pain medication, and ambulate with minimal assistance.  She is being discharged home on 2 Days Post-Op in stable condition. She will be discharged on extended VTE prophylaxis with 4 weeks of Lovenox .   Condition at Discharge: Improved Discharge Medications:    Medication List    PAUSE taking these medications   . * losartan  100 MG tablet; Wait to take this until your doctor or other  care provider tells you to start again.; Commonly known as: COZAAR ; You  also have another medication with the same name that you may need to  continue taking. . metFORMIN  500 MG tablet; Wait to take this until your doctor or other  care provider tells you to start again.; Commonly known as: GLUCOPHAGE  . metoPROLOL  succinate 100 MG 24 hr tablet; Wait to take this until your  doctor or other care provider tells you to start again.; Commonly known  as: Toprol -XL . * modafinil  100 MG tablet; Wait to take this until: July 11, 2024;  Commonly known as: PROVIGIL  . * modafinil  200 MG tablet; Wait to take this until: July 11, 2024;  Commonly known as: PROVIGIL  . spironolactone  25 MG tablet; Wait to take this until your doctor or  other care provider tells you to start again.; Commonly known as:  ALDACTONE  . *  torsemide  20 MG tablet; Wait to take this until your doctor or other  care provider tells you to start again.; Follow up with your primary care  physician to dose adjust this medication as your weight loss occurs;  Commonly known as: DEMADEX ; You also have another medication with the  same  name that you may need to continue taking. . traMADol  50 mg tablet; Wait to take this until: July 11, 2024;  Commonly known as: ULTRAM  * This list has 4 medication(s) that are the same as other medications  prescribed for you. Read the directions carefully, and ask your doctor or  other care provider to review them with you.   START taking these medications   . acetaminophen  650 mg/20.3 mL Soln; Commonly known as: TYLENOL ; Take 31.2  mL (1,000 mg total) by mouth every six (6) hours as needed. . enoxaparin  40 mg/0.4 mL Syrg; Commonly known as: LOVENOX ; Inject 0.4 mL  (40 mg total) under the skin every twelve (12) hours. . gabapentin 300 mg/6 mL (6 mL) Soln oral solution; Commonly known as:  NEURONTIN; Take 6 mL (300 mg total) by mouth Three (3) times a day for 3  days. . methocarbamol 500 MG tablet; Commonly known as: ROBAXIN; Take 2 tablets  (1,000 mg total) by mouth Three (3) times a day as needed. . omeprazole  20 MG capsule; Commonly known as: PriLOSEC; Take 1 capsule  (20 mg total) by mouth daily. Open capsule, granules will disperse in  water or can be taken with a spoonful of greek yogurt or applesauce . ondansetron  4 MG disintegrating tablet; Commonly known as: ZOFRAN -ODT;  Take 1 tablet (4 mg total) by mouth every six (6) hours as needed for  nausea. . oxyCODONE  5 mg/5 mL solution; Commonly known as: ROXICODONE ; Take 5 mL  (5 mg total) by mouth every four (4) hours as needed for up to 5 days.   CHANGE how you take these medications   . * losartan  50 MG tablet; Commonly known as: COZAAR ; Take 0.5 tablets (25  mg total) by mouth daily. Crush, tablet will disperse in water; What  changed: Another medication with the same name was paused. Ask your nurse  or doctor if you should take this medication. . * torsemide  10 MG tablet; Commonly known as: DEMADEX ; Take 1 tablet (10  mg total) by mouth daily. Crush, tablet will disperse in water; Start  taking on: June 15, 2024;  What changed: Another medication with the same  name was paused. Ask your nurse or doctor if you should take this  medication. * This list has 2 medication(s) that are the same as other medications  prescribed for you. Read the directions carefully, and ask your doctor or  other care provider to review them with you.   CONTINUE taking these medications   . ergocalciferol -1,250 mcg (50,000 unit) 1,250 mcg (50,000 unit) capsule;  Commonly known as: DRISDOL  . lamoTRIgine  200 MG tablet; Commonly known as: LaMICtal  . levothyroxine  150 MCG tablet; Commonly known as: SYNTHROID  . rosuvastatin  20 MG tablet; Commonly known as: CRESTOR  . sertraline  100 MG tablet; Commonly known as: ZOLOFT    STOP taking these medications   . semaglutide  2 mg/dose (8 mg/3 mL) Pnij; Commonly known as: OZEMPIC     Pending Test Results:   Discharge Instructions: Activity:  Activity Instructions     ASSIGN TO ENHANCED RECOVERY AFTER SURGERY PATHWAY        Diet: Diet Instructions     Discharge diet (specify)  Discharge Nutrition Therapy: Bariatric.   Type: Bariatric Full Liquid      Other Instructions: Other Instructions     Call MD for:  difficulty breathing, headache or visual disturbances     Call MD for:  hives     Call MD for:  persistent dizziness or light-headedness     Call MD for:  persistent nausea or vomiting     Call MD for:  redness, tenderness, or signs of infection (pain, swelling, redness, odor or green/yellow discharge around incision site)     Call MD for:  severe uncontrolled pain     Call MD for: Temperature > 38.5 Celsius ( > 101.3 Fahrenheit)     Discharge instructions     Post-op Weight Loss Surgery Instructions   If you have questions or concerns after surgery, call [facility] office at [phone].  After business hours, please call, the [facility] operator at [phone] and ask for the [facility]) Surgical Resident on-call.  You will be directed to a  surgery resident who likely is not familiar with your specific case, but can help you deal with any emergencies that cannot wait until regular business hours.  Please be aware that this person is responding to many in-hospital emergencies and patient issues and may not answer your phone call immediately.   In addition, call our office if you experience any of the following issues:   Fever >101.5  Severe nausea and vomiting  Redness/drainage from your incision  Swelling and/or pain in your legs  Chest pain  Shortness of breath  Inadequate pain control  Bloody stools    What will I experience after my operation? You may feel tired. It is important to take time to rest. Your incisions may feel sore. Some soreness is normal, but don't be afraid to take your pain medicine to ease the soreness.  You may also use heat packs or ice packs to help with soreness at your incisions.  If fatigue persists, make sure you are reaching your protein goals; inadequate protein intake will worsen your fatigue.   What foods can I eat? Follow the diet instructions provided in your binder and by the inpatient dietician team.  You will likely be on full liquid diets (protein shakes, broth, water, etc.) for a minimum of 10 days post-op.  If you are reaching your protein and liquid intake, you may advance to pureed food 11 days post-op.   What medicines will I be taking? Your doctor will review your medicines and make any prescription changes before you go home.  Here are some helpful hints on taking medicines: - You will be on acid suppression medications (for 6 months after gastric bypass and 1 month after sleeve) - Your medications will be reviewed by the surgical team and some changes may be made   What will I take if I have pain or nausea? Pain: Ice packs and heat packs can be particularly helpful for incisional pain after surgery.  You can use on ice pack on your incisions for on/30 min off, for as long  as necessary.  If you develop muscle spasms, heat packs may also help to soothe the muscle spasm, but please be careful not to burn your skin.  You may also use over the counter lidocaine  patches to help with post-operative pain.  1) Acetaminophen  (Tylenol ; 650mg  every 6 hours, as needed).  You should take this as a liquid or crushed.   Too much Acetaminophen   can be harmful to your liver, so please do not take more than 3000mg  in any 24 hour period.  Some pain medications, cold/flu medications, and cough medications may have additional Acetaminophen , so please check the labels or talk to you doctor or pharmacist before you take additional medications.  2) Most patients will be sent home with liquid gabapentin.  You will take this at a dose of 300mg  every 8 hours until you are 5 days after surgery.  The dose will then decrease to 100mg  every 8 hours for another 5 days. After this, you will stop the gabapentin.  Gabapentin can make you feel drowsy; should this effect be too strong, please call our clinic for further guidance.  3) Methocarbamol (Robaxin) is a muscle relaxant; this can be particularly helpful for incisional pain.  You can take 500 mg to 1000mg  every 8 hours as needed for pain after surgery (this should be crushed).This medication may also make you drowsy, so do not drive while taking this medication.  We cannot ever take all of the pain away after surgery. However, the pain should improve day by day. It is important to minimize narcotic pain medication after surgery, as these medications carry potential for addiction.  This potential is increased after bariatric surgery.  Should your pain not be well enough controlled with the medications listed above to allow you to perform daily activities and to take deep breaths, you can consider taking some of the narcotic pain medication we will provide you after surgery.  4) Oxycodone  is a narcotic pain medication.  You may take 5mg  to 10mg  every 6  hours as needed for pain not well controlled by the non-narcotic pain medication. Oxycodone  can become addictive. You should not drive or make important personal, business, or financial decisions while taking narcotic pain medications.  Keep this medication out of the reach of children, and be sure to dispose of any excess oxycodone  appropriately at your local pharmacy.  If you have excess medication, you may bring it to your post-operative visit for us  to dispose of for you.  Nausea:  You will be given a prescription for a dissolving tablet for nausea. If you are not getting your medications at the [facility], it is a good idea to call your local pharmacy to be sure they have the medicines in stock. The nausea medicine should be taken to prevent retching (dry heaves) for any reason for 3-6 months after you go home.   Nausea after bariatric surgery can be common within the first 2 weeks and with transitions in your diet. Nausea can also worsen with even small degrees of dehydration, as well as constipation.  If you experience nausea, take the anti-nausea medication provided.  Then be sure to pay close attention to your hydration, even going so far as to set a timer to remind yourself to drink ~ 1 oz every 10-15min as tolerated.  If you experience nausea or emesis (throwing up) when you transition your diet, go back to your previously diet for a few more days before advancing again.  Be sure to chew your food well and take small bites. As soon as you feel full, stop eating or drinking.   How active can I be? Being up and moving around helps prevent complications, like blood clots and lung infections and helps with healing. Using an abdominal binder will help if you have incisional pain with activity. You can do daily activities, walk up stairs and be outdoors. Do not  drive or operate heavy machinery while taking narcotic medications. You can walk as much as you can tolerate. The goal by 1 month is 1  mile/day. Stationary bike and low-impact activity is fine at 2 weeks after surgery; avoid abdominal crunches and lifting anything heavier than 15lbs for 6 weeks post-surgery.   What about lifting? Do not lift more than 15 lbs. for at least 6 weeks after your operation. Talk about lifting and other activities with your surgeon at your clinic visit.   When can I return to work? You can return to light duty once you feel better and are not taking narcotic pain medicines, usually in 1-2 weeks. If your job is strenuous, you may require 3 or more weeks off work. Please call the GI Surgery office at [phone] and we will help you with Work Leave documents.   How do I take care of my dressings? Your wounds were glued.  The coating will peel off in 1-2 weeks.  If the skin glue remains in place 2 weeks after surgery, you may peel it off after showering.   When can I shower? You can shower the day after surgery. Pat the wounds dry. Do not rub them with a towel. Do not soak in the bathtub or go swimming until incisions are healed (usually 2 weeks).   When should I call my surgeon? Call us  if you have any of the following: - Fever higher than 101.5 degrees F - Not able to drink liquids - Drainage or opening at an incision - Incision that is reddened, firm or hot to touch - Burning with urination or not being able to pass urine - Constipation lasting for more than 3 days.    Whom can I call if I have any problems or questions?  - During regular business hours: (Monday-Friday, 8am-4:00 pm) you may call the GI Surgery Office at [phone].  - For emergencies hours business hours please call the [facility] operator at [phone] and ask for the Surgical Resident on-call.  You will be directed to a surgery resident who likely is not familiar with your specific case, but can help you deal with any emergencies that cannot wait until regular business hours.  Please be aware that this person is  responding to many in-hospital emergencies and patient issues and may not answer your phone call immediately.  - If you did not receive your follow-up appointment details at the time of discharge, then contact the GI Surgery clinic at [phone].   Post-operative constipation  Most of the time, adequate hydration will control constipation. However, with the increased protein in diet along with narcotic medication, patients will sometimes have constipation after surgery. You may take any over the counter stool softener, laxative, or enema that you find to be effective.  Liquids are preferred for the first 1 month after surgery.  Milk of magnesia is a small volume and effective, and you can begin by taking 1oz daily x 2 days. If no BM, you may increase to 1oz twice daily.  Liquid Colace 100mg  can be taken twice daily post-operatively and Milk of magnesia (30mL one to two times per day) or Miralax  powder as needed. Please do not take Miralax  more than 2 weeks consecutively as your body can become reliant on it for a bowel movement.  Incentive spirometry  You received an incentive spirometer in [facility]. Continue to use it every hour while awake for the first week after surgery.   Prescription  medications Resume your usual medications unless you have been told otherwise.   You will need to crush your pills for 4 weeks after surgery unless they are smaller than your pinky fingernail. The exception is your omeprazole  (Prilosec), you may open this and sprinkle the granules on your food.  Please do not take any medication for sleep or anxiety while taking narcotic medications.   Start Lovenox  injections as directed (not all patients will receive this medication). DO NOT START LOVENOX  UNTIL YOU ARE POST-OP. Here is a video that you can access online for lovenox  injections. [email or web address] [email or web address]   All estrogen related birth control or hormone replacement  therapy will not be restarted until 4 weeks after surgery. For alternative birth control methods, please talk to your primary care provider. Use a back-up method if pregnancy is possible.   All diuretic medications will not be restarted until 4 weeks after surgery, if needed. (This includes HCTZ, Lasix , Spironolactone , etc., unless specifically discussed with your surgeon)   If you are on oral or injectable medications for your diabetes, please pay attention to your medication list at the time of discharge. Taking medications called SGLT2 inhibitors and GLP1 receptor agonists can lead to serious complications after surgery, and should not be restarted until you are on a regular diet after surgery.  If you are concerned you are being discharged on one of these medications, please call our office to discuss further.   Please follow up with primary care provider within a month of surgery to discuss medication management.   NO NSAIDs if you are having the gastric bypass NSAIDS (nonsteroidal anti-inflammatory drugs): These include ibuprofen  (Advil , Motrin ); naproxen (Aleve, Naprosyn); indomethacin (Indocin); meloxicam  (Mobic ); and ketorolac  (Toradol ). Patients who have the sleeve can take aspirin and NSAID's, although these medications can cause irritation of the stomach.  Consult with our office if a physician is needing to prescribe you NSAIDs to discuss further.  Vitamins: Gastric bypass and gastric sleeve Start a chewable or liquid multivitamin after your post-operative appointment. These medications can be nauseating in the immediate postoperative period, but shouldbe started 2 weeks after surgery. We will discuss additional vitamins at your post-operative appointment.      Labs and Other Follow-ups after Discharge: Follow Up instructions and Outpatient Referrals    Call MD for:  difficulty breathing, headache or visual disturbances     Call MD for:  hives     Call MD for:  persistent dizziness  or light-headedness     Call MD for:  persistent nausea or vomiting     Call MD for:  redness, tenderness, or signs of infection (pain, swelling,  redness, odor or green/yellow discharge around incision site)     Call MD for:  severe uncontrolled pain     Call MD for: Temperature > 38.5 Celsius ( > 101.3 Fahrenheit)     Discharge instructions       Future Appointments: Appointments which have been scheduled for you    Jun 28, 2024 10:30 AM (Arrive by 10:00 AM) RETURN GENERAL with Annitta Herminio Georgi, MD [facility] MULTISPECIALTY SURGERY GI SURGERY [place] [facility] REGION) [address] [place] [address] [phone]         Attestation  I saw and evaluated the patient, participating in the key portions of the service on the day of discharge.  I reviewed the trainee's note and agree with the discharge plans and disposition. I personally spent 5 minutes in discharge planning services.  Annitta EMERSON Georgi, MD, MPH 06/22/2024 8:58 AM

## 2024-06-19 ENCOUNTER — Ambulatory Visit (INDEPENDENT_AMBULATORY_CARE_PROVIDER_SITE_OTHER): Admitting: Psychology

## 2024-06-19 DIAGNOSIS — F4310 Post-traumatic stress disorder, unspecified: Secondary | ICD-10-CM

## 2024-06-19 DIAGNOSIS — F428 Other obsessive-compulsive disorder: Secondary | ICD-10-CM

## 2024-06-19 NOTE — Progress Notes (Unsigned)
 [facility] Counselor/Therapist Progress Note  Patient ID: CAMAURI FLEECE, MRN: [id number],    Date: 06/19/2024  Time spent: 57 minutes  Time In:  1:07 Time out:2:04  Treatment Type: Individual Therapy  Reported Symptoms: sadness,   Mental Status Exam: Appearance:  Casual     Behavior: Appropriate  Motor: Normal  Speech/Language:  Normal Rate  Affect: blunted  Mood: pleasant  Thought process: normal  Thought content:   WNL  Sensory/Perceptual disturbances:   WNL  Orientation: oriented to person, place, time/date, and situation  Attention: Good  Concentration: Good  Memory: WNL  Fund of knowledge:  Good  Insight:   Good  Judgment:  Good  Impulse Control: Good   Risk Assessment: Danger to Self:  No Self-injurious Behavior: No Danger to Others: No Duty to Warn:no Physical Aggression / Violence:No  Access to Firearms a concern: No  Gang Involvement:No   Subjective: The patient attended a face-to-face individual therapy session today via video visit .  The patient gave verbal consent for the video visit to be on Caregility and the patient is aware of the limitations of telehealth.  The patient was in her home alone and the therapist was in the office.   The patient presents as pleasant and cooperative.   The patient had her surgery last Wednesday and seems to be doing well.  She talked about what the process was like and talked about her recovery when she finished with the surgery.  She talked about still feeling like it really was not going to work or that maybe it did not even happen.  I did explain that it is likely to work and it did happen and we are just going to have to move through it and see how it works for her.  I think some of her angst is because she has not lost any weight yet but I explained to her that she probably is still swollen from the surgery.  We talked about getting more in depth in how she is thinking about herself and her weight loss moving  forward.  I explained to her that we are pacing ourselves and we will deal with things as it comes up. Interventions: Cognitive Behavioral Therapy, Assertiveness/Communication, and Problem solving, psycho education  Diagnosis:PTSD (post-traumatic stress disorder)  Obsessional thoughts  Plan: Treatment Plan  Strengths/Abilities:  Intelligent, ability for insight, supportive wife  Treatment Preferences:  Outpatient Individual therapy  Statement of Needs:  I Need help with my depression and motivation.   Symptoms:Depressed or irritable mood.:(Status: improved). Diminished interest in or  enjoyment of activities.(Status: improved). Feelings of hopelessness,  worthlessness, or inappropriate guilt.: (Status: improved). History of chronic or recurrent depression for which the client has taken antidepressant medication, been hospitalized, had  outpatient treatment, or had a course of electroconvulsive therapy.(Status:  maintained). Lack of energy.: (Status: maintained). Low self-esteem.:  (Status: improved). Poor concentration and indecisiveness.:  (Status: improved). Social withdrawal.:  (Status: maintained).  Unresolved grief issues.: (Status: maintained).  Problems Addressed:  Unipolar Depression  Goals:  LTG:1. Alleviate depressive symptoms and return to previous level of effective  functioning.  70% 2. Appropriately grieve the loss in order to normalize mood and to return  to previously adaptive level of functioning. 3. Develop healthy interpersonal relationships that lead to the alleviation  and help prevent the relapse of depression.  70% 4. Develop healthy thinking patterns and beliefs about self, others, and the world that lead to the alleviation and help prevent the relapse  of  Depression  70% STG:1.Describe current and past experiences with depression including their impact on functioning and  attempts to resolve it.  70 % 2. Identify and replace thoughts and beliefs that  support depression.  70 % 3.Learn and implement behavioral strategies to overcome depression. 70% 4.Verbalize an understanding of healthy and unhealthy emotions with the intent of increasing the use of  healthy emotions to guide actions.  50 %  Target Date:  10/07/2024 Frequency: Bi Weekly Modality:Individual Interventions by Therapist:  CBT, problem solving, EMDR, insight oriented  Patient approved Treatment Plan  Vonzella Althaus [name], LCSW

## 2024-06-26 ENCOUNTER — Ambulatory Visit (INDEPENDENT_AMBULATORY_CARE_PROVIDER_SITE_OTHER): Admitting: Psychology

## 2024-06-26 DIAGNOSIS — F422 Mixed obsessional thoughts and acts: Secondary | ICD-10-CM | POA: Diagnosis not present

## 2024-06-26 DIAGNOSIS — F4323 Adjustment disorder with mixed anxiety and depressed mood: Secondary | ICD-10-CM | POA: Diagnosis not present

## 2024-06-26 DIAGNOSIS — F428 Other obsessive-compulsive disorder: Secondary | ICD-10-CM

## 2024-06-26 DIAGNOSIS — F4310 Post-traumatic stress disorder, unspecified: Secondary | ICD-10-CM | POA: Diagnosis not present

## 2024-06-26 DIAGNOSIS — F3341 Major depressive disorder, recurrent, in partial remission: Secondary | ICD-10-CM

## 2024-06-26 NOTE — Progress Notes (Unsigned)
 [facility] Counselor/Therapist Progress Note  Patient ID: GENOLA YUILLE, MRN: [id number],    Date: 06/26/2024  Time spent: 54 minutes  Time In:  1:05 Time out:2:01  Treatment Type: Individual Therapy  Reported Symptoms: sadness,   Mental Status Exam: Appearance:  Casual     Behavior: Appropriate  Motor: Normal  Speech/Language:  Normal Rate  Affect: blunted  Mood: pleasant  Thought process: normal  Thought content:   WNL  Sensory/Perceptual disturbances:   WNL  Orientation: oriented to person, place, time/date, and situation  Attention: Good  Concentration: Good  Memory: WNL  Fund of knowledge:  Good  Insight:   Good  Judgment:  Good  Impulse Control: Good   Risk Assessment: Danger to Self:  No Self-injurious Behavior: No Danger to Others: No Duty to Warn:no Physical Aggression / Violence:No  Access to Firearms a concern: No  Gang Involvement:No   Subjective: The patient attended a face-to-face individual therapy session today via video visit .  The patient gave verbal consent for the video visit to be on Caregility and the patient is aware of the limitations of telehealth.  The patient was in her home alone and the therapist was in the office.   The patient presents as pleasant and cooperative.  She reports that she is feels like she is still doing well from her surgery.  She has stepped up a level to pured foods now and that seems to be going well.  She has a meeting this coming week with her surgeon to find out how she is doing since she had the surgery.  We talked about some of the dynamics between she and her wife and how her wife is her caregiver and in some ways that is her identity.  I believe that the patient also allows her wife to enable her sometimes with some of the things that she has her do for her.  We will continue to work with her on this transition to losing weight and understanding her responsibility in caring for herself.     Interventions: Cognitive Behavioral Therapy, Assertiveness/Communication, and Problem solving, psycho education  Diagnosis:PTSD (post-traumatic stress disorder)  Obsessional thoughts  Major depressive disorder, recurrent episode, in partial remission (HCC)  Adjustment disorder with mixed anxiety and depressed mood  Plan: Treatment Plan  Strengths/Abilities:  Intelligent, ability for insight, supportive wife  Treatment Preferences:  Outpatient Individual therapy  Statement of Needs:  I Need help with my depression and motivation.   Symptoms:Depressed or irritable mood.:(Status: improved). Diminished interest in or  enjoyment of activities.(Status: improved). Feelings of hopelessness,  worthlessness, or inappropriate guilt.: (Status: improved). History of chronic or recurrent depression for which the client has taken antidepressant medication, been hospitalized, had  outpatient treatment, or had a course of electroconvulsive therapy.(Status:  maintained). Lack of energy.: (Status: maintained). Low self-esteem.:  (Status: improved). Poor concentration and indecisiveness.:  (Status: improved). Social withdrawal.:  (Status: maintained).  Unresolved grief issues.: (Status: maintained).  Problems Addressed:  Unipolar Depression  Goals:  LTG:1. Alleviate depressive symptoms and return to previous level of effective  functioning.  70% 2. Appropriately grieve the loss in order to normalize mood and to return  to previously adaptive level of functioning. 3. Develop healthy interpersonal relationships that lead to the alleviation  and help prevent the relapse of depression.  70% 4. Develop healthy thinking patterns and beliefs about self, others, and the world that lead to the alleviation and help prevent the relapse of  Depression  70%  STG:1.Describe current and past experiences with depression including their impact on functioning and  attempts to resolve it.  70 % 2. Identify and  replace thoughts and beliefs that support depression.  70 % 3.Learn and implement behavioral strategies to overcome depression. 70% 4.Verbalize an understanding of healthy and unhealthy emotions with the intent of increasing the use of  healthy emotions to guide actions.  50 %  Target Date:  10/07/2024 Frequency: Bi Weekly Modality:Individual Interventions by Therapist:  CBT, problem solving, EMDR, insight oriented  Patient approved Treatment Plan  Jeromiah Ohalloran [name], LCSW

## 2024-06-28 ENCOUNTER — Other Ambulatory Visit: Payer: Self-pay

## 2024-06-28 ENCOUNTER — Other Ambulatory Visit (HOSPITAL_COMMUNITY): Payer: Self-pay

## 2024-06-28 MED ORDER — URSODIOL 300 MG PO CAPS
300.0000 mg | ORAL_CAPSULE | Freq: Two times a day (BID) | ORAL | 5 refills | Status: DC
  Filled 2024-06-28: qty 60, 30d supply, fill #0

## 2024-07-03 ENCOUNTER — Ambulatory Visit (INDEPENDENT_AMBULATORY_CARE_PROVIDER_SITE_OTHER): Admitting: Psychology

## 2024-07-03 DIAGNOSIS — F428 Other obsessive-compulsive disorder: Secondary | ICD-10-CM | POA: Diagnosis not present

## 2024-07-03 DIAGNOSIS — F4323 Adjustment disorder with mixed anxiety and depressed mood: Secondary | ICD-10-CM | POA: Diagnosis not present

## 2024-07-03 DIAGNOSIS — F4310 Post-traumatic stress disorder, unspecified: Secondary | ICD-10-CM | POA: Diagnosis not present

## 2024-07-03 DIAGNOSIS — F3341 Major depressive disorder, recurrent, in partial remission: Secondary | ICD-10-CM | POA: Diagnosis not present

## 2024-07-03 NOTE — Progress Notes (Unsigned)
                edge,  experiencing concentration difficulties, having trouble falling or staying asleep, exhibiting a general  state of irritability).: No Description Entered (Status: improved). Motor tension (e.g., restlessness,  tiredness, shakiness, muscle tension).: No Description Entered (Status: improved).  Problems Addressed  Anxiety, Phase Of Life Problems, Anxiety  Goals 1. Learn and implement coping skills that result in a reduction of anxiety  and worry, and improved daily functioning. Objective Learn  and implement calming skills to reduce overall anxiety and manage anxiety symptoms. Target Date: 2025-08-09Frequency: Weekly Progress: 40 Modality: individual  Related Interventions 1. Teach the client calming/relaxation skills (e.g., applied relaxation, progressive muscle  relaxation, cue controlled relaxation; mindful breathing; biofeedback) and how to discriminate  better between relaxation and tension; teach the client how to apply these skills to his/her daily  life (e.g., New Directions in Progressive Muscle Relaxation by Marcelyn Ditty, and  Hazlett-Stevens; Treating Generalized Anxiety Disorder by Rygh and Ida Rogue). Objective Identify, challenge, and replace biased, fearful self-talk with positive, realistic, and empowering selftalk. Target Date: 2024-07-06 Frequency: weekly Progress: 30 Modality: individual Related Interventions 1. Explore the client's schema and self-talk that mediate his/her fear response; assist him/her in  challenging the biases; replace the distorted messages with reality-based alternatives and  positive, realistic self-talk that will increase his/her self-confidence in coping with irrational  fears (see Cognitive Therapy of Anxiety Disorders by Laurence Slate). Objective Learn and implement problem-solving strategies for realistically addressing worries. Target Date: 2025-08-09Frequency: weekly Progress: 40 Modality: individual 2. Resolve conflicted feelings and adapt to the new life circumstances. Objective Apply problem-solving skills to current circumstances. Target Date: 2024-07-06 Frequency: weekly Progress: 20 Modality: individual Related Interventions 1. Teach the client problem-resolution skills (e.g., defining the problem clearly, brainstorming  multiple solutions, listing the pros and cons of each solution, seeking input from others,  selecting and implementing a plan of action, evaluating outcome, and readjusting plan as   necessary).   3. Stabilize anxiety level while increasing ability to function on a daily  basis. 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  The patient approved this plan.   Deonna Krummel G Ethridge Sollenberger, LCSW

## 2024-07-05 ENCOUNTER — Encounter: Payer: BC Managed Care – PPO | Admitting: Internal Medicine

## 2024-07-10 ENCOUNTER — Encounter: Payer: Self-pay | Admitting: Hematology and Oncology

## 2024-07-10 ENCOUNTER — Other Ambulatory Visit (HOSPITAL_COMMUNITY): Payer: Self-pay

## 2024-07-10 ENCOUNTER — Other Ambulatory Visit: Payer: Self-pay

## 2024-07-10 ENCOUNTER — Ambulatory Visit (INDEPENDENT_AMBULATORY_CARE_PROVIDER_SITE_OTHER): Admitting: Psychology

## 2024-07-10 ENCOUNTER — Other Ambulatory Visit: Payer: Self-pay | Admitting: Internal Medicine

## 2024-07-10 ENCOUNTER — Other Ambulatory Visit: Payer: Self-pay | Admitting: Adult Health

## 2024-07-10 DIAGNOSIS — F3341 Major depressive disorder, recurrent, in partial remission: Secondary | ICD-10-CM | POA: Diagnosis not present

## 2024-07-10 DIAGNOSIS — F331 Major depressive disorder, recurrent, moderate: Secondary | ICD-10-CM

## 2024-07-10 DIAGNOSIS — D509 Iron deficiency anemia, unspecified: Secondary | ICD-10-CM

## 2024-07-10 DIAGNOSIS — F4310 Post-traumatic stress disorder, unspecified: Secondary | ICD-10-CM

## 2024-07-10 DIAGNOSIS — F428 Other obsessive-compulsive disorder: Secondary | ICD-10-CM | POA: Diagnosis not present

## 2024-07-10 MED ORDER — LAMOTRIGINE 200 MG PO TABS
200.0000 mg | ORAL_TABLET | Freq: Every day | ORAL | 0 refills | Status: DC
  Filled 2024-07-10 (×2): qty 30, 30d supply, fill #0

## 2024-07-10 NOTE — Progress Notes (Signed)
 [facility] Counselor/Therapist Progress Note  Patient ID: Alexandra Beck, MRN: [id number],    Date: 07/10/2024  Time spent: 55 minutes  Time In:  1:05 Time out:2:00  Treatment Type: Individual Therapy  Reported Symptoms: sadness,   Mental Status Exam: Appearance:  Casual     Behavior: Appropriate  Motor: Normal  Speech/Language:  Normal Rate  Affect: blunted  Mood: sad  Thought process: normal  Thought content:   WNL  Sensory/Perceptual disturbances:   WNL  Orientation: oriented to person, place, time/date, and situation  Attention: Good  Concentration: Good  Memory: WNL  Fund of knowledge:  Good  Insight:   Good  Judgment:  Good  Impulse Control: Good   Risk Assessment: Danger to Self:  No Self-injurious Behavior: No Danger to Others: No Duty to Warn:no Physical Aggression / Violence:No  Access to Firearms a concern: No  Gang Involvement:No   Subjective: The patient attended a face-to-face individual therapy session today via video visit .  The patient gave verbal consent for the video visit to be on Caregility and the patient is aware of the limitations of telehealth.  The patient was in her home alone and the therapist was in the office.   The patient presents as an depressed today.  The patient reports that she really does not have a lot to talk about today and talked again about what things she is eating and how the journey is going with her weight loss.  Towards the end of the session she said she is not sure that this is helpful just because we are talking about the same things that she talks about with other providers that she has.  I explained to her that I am looking at the situation with the differently ends than her other providers are looking at it and that I believe that she is now sad because she has lost her coping strategy that she has used her entire life to deal with difficult situations.  In addition I told her that I thought that she and [name]  would have to make changes in how they socialize because they have bonded over food and that is going to be a different scenario now.  I explained to her that we we will need to be discussing how her mood is as she goes along through this weight loss journey and that in some ways she probably has not realized that she has potentially lost her best friend which was food.  The patient did become tearful when she was aware that what I was saying was accurate and I think her trying to divert was an attempt not to deal with things.   Interventions: Cognitive Behavioral Therapy, Assertiveness/Communication, and Problem solving, psycho education  Diagnosis:PTSD (post-traumatic stress disorder)  Obsessional thoughts  Major depressive disorder, recurrent episode, in partial remission (HCC)  Plan: Treatment Plan  Strengths/Abilities:  Intelligent, ability for insight, supportive wife  Treatment Preferences:  Outpatient Individual therapy  Statement of Needs:  I Need help with my depression and motivation.   Symptoms:Depressed or irritable mood.:(Status: improved). Diminished interest in or  enjoyment of activities.(Status: improved). Feelings of hopelessness,  worthlessness, or inappropriate guilt.: (Status: improved). History of chronic or recurrent depression for which the client has taken antidepressant medication, been hospitalized, had  outpatient treatment, or had a course of electroconvulsive therapy.(Status:  maintained). Lack of energy.: (Status: maintained). Low self-esteem.:  (Status: improved). Poor concentration and indecisiveness.:  (Status: improved). Social withdrawal.:  (Status: maintained).  Unresolved grief issues.: (Status: maintained).  Problems Addressed:  Unipolar Depression  Goals:  LTG:1. Alleviate depressive symptoms and return to previous level of effective  functioning.  70% 2. Appropriately grieve the loss in order to normalize mood and to return  to previously  adaptive level of functioning. 3. Develop healthy interpersonal relationships that lead to the alleviation  and help prevent the relapse of depression.  70% 4. Develop healthy thinking patterns and beliefs about self, others, and the world that lead to the alleviation and help prevent the relapse of  Depression  70% STG:1.Describe current and past experiences with depression including their impact on functioning and  attempts to resolve it.  70 % 2. Identify and replace thoughts and beliefs that support depression.  70 % 3.Learn and implement behavioral strategies to overcome depression. 70% 4.Verbalize an understanding of healthy and unhealthy emotions with the intent of increasing the use of  healthy emotions to guide actions.  50 %  Target Date:  10/07/2024 Frequency: Bi Weekly Modality:Individual Interventions by Therapist:  CBT, problem solving, EMDR, insight oriented  Patient approved Treatment Plan  Ramy Greth [name], LCSW

## 2024-07-10 NOTE — Telephone Encounter (Signed)
 Pt was due for FU in Feb. Asked admin to call. She needs to wean off Lamictal  if she is not going to FU.

## 2024-07-10 NOTE — Telephone Encounter (Signed)
 Please call to schedule FU, was due in February. She needs to wean off the Lamictal  if she is not going to make an appointment.

## 2024-07-10 NOTE — Telephone Encounter (Signed)
 Pt is scheduled August 29th

## 2024-07-11 ENCOUNTER — Other Ambulatory Visit: Payer: Self-pay

## 2024-07-11 ENCOUNTER — Other Ambulatory Visit (HOSPITAL_COMMUNITY): Payer: Self-pay

## 2024-07-11 MED ORDER — ROSUVASTATIN CALCIUM 20 MG PO TABS
20.0000 mg | ORAL_TABLET | Freq: Every day | ORAL | 3 refills | Status: DC
  Filled 2024-07-11: qty 90, 90d supply, fill #0

## 2024-07-15 ENCOUNTER — Ambulatory Visit (INDEPENDENT_AMBULATORY_CARE_PROVIDER_SITE_OTHER): Admitting: Family Medicine

## 2024-07-15 ENCOUNTER — Encounter (INDEPENDENT_AMBULATORY_CARE_PROVIDER_SITE_OTHER): Payer: Self-pay | Admitting: Family Medicine

## 2024-07-15 VITALS — BP 111/68 | HR 55 | Temp 98.3°F | Ht 63.0 in | Wt 325.0 lb

## 2024-07-15 DIAGNOSIS — D508 Other iron deficiency anemias: Secondary | ICD-10-CM

## 2024-07-15 DIAGNOSIS — E1165 Type 2 diabetes mellitus with hyperglycemia: Secondary | ICD-10-CM

## 2024-07-15 DIAGNOSIS — Z6841 Body Mass Index (BMI) 40.0 and over, adult: Secondary | ICD-10-CM | POA: Diagnosis not present

## 2024-07-15 NOTE — Progress Notes (Unsigned)
   SUBJECTIVE:  Chief Complaint: Obesity  Interim History: Patient underwent bariatric surgery last month.  She is now eating (mostly soft food) and is getting 80 grams of protein daily.  She is feeling better in terms of how she is progressing.  She is trying to find things that are easy and require minimal prep.  Georga is here to discuss her progress with her obesity treatment plan. She is on the soft food wit h 80 grams of protein per bariatric program and states she is following her eating plan approximately 100 % of the time. She states she is not exercising.   OBJECTIVE: Visit Diagnoses: Problem List Items Addressed This Visit       Endocrine   Diabetes mellitus (HCC) - Primary     Other   Morbid obesity (HCC)   IDA (iron  deficiency anemia)   Other Visit Diagnoses       BMI 50.0-59.9, adult (HCC)           Vitals Temp: 98.3 F (36.8 C) BP: 111/68 Pulse Rate: (!) 55 SpO2: 97 %   Anthropometric Measurements Height: 5' 3 (1.6 m) Weight: (!) 325 lb (147.4 kg) BMI (Calculated): 57.59 Weight at Last Visit: 362 lb Weight Lost Since Last Visit: 37 lb Starting Weight: 468 lb Total Weight Loss (lbs): 143 lb (64.9 kg)   Body Composition  Body Fat %: 56 % Fat Mass (lbs): 182.2 lbs Muscle Mass (lbs): 136 lbs Visceral Fat Rating : 23   Other Clinical Data Today's Visit #: 62 Starting Date: 04/17/18 Comments: [facility]/[place]     ASSESSMENT AND PLAN: Assessment & Plan Type 2 diabetes mellitus with hyperglycemia, without long-term current use of insulin  [facility]) Patient is holding all medications at this time given recent bariatric surgery.  Will reevaluate need to reintroduce medications when patient is back on full diet. Other iron  deficiency anemia Patient is feeling significantly fatigued and is attributing this to blood loss and iron  deficiency.  She has upcoming appointment to get repeat labs drawn by hematology.  Will follow-up on repeat labs and plan at next  appointment. Morbid obesity (HCC)  BMI 50.0-59.9, adult (HCC)    Diet: Jadalee is currently in the action stage of change. As such, her goal is to continue with weight loss efforts and has agreed to keeping a food journal and adhering to recommended goals of 80 or more grams protein daily.   Exercise:  All adults should avoid inactivity. Some activity is better than none, and adults who participate in any amount of physical activity, gain some health benefits.  Behavior Modification:  We discussed the following Behavioral Modification Strategies today: increasing lean protein intake and increase H2O intake.   No follow-ups on file.   She was informed of the importance of frequent follow up visits to maximize her success with intensive lifestyle modifications for her multiple health conditions.  Attestation Statements:   Reviewed by clinician on day of visit: allergies, medications, problem list, medical history, surgical history, family history, social history, and previous encounter notes.     [name], MD

## 2024-07-16 ENCOUNTER — Encounter: Payer: Self-pay | Admitting: Internal Medicine

## 2024-07-16 ENCOUNTER — Encounter: Payer: Self-pay | Admitting: Sports Medicine

## 2024-07-16 DIAGNOSIS — Z124 Encounter for screening for malignant neoplasm of cervix: Secondary | ICD-10-CM

## 2024-07-16 NOTE — Assessment & Plan Note (Signed)
 Patient is feeling significantly fatigued and is attributing this to blood loss and iron  deficiency.  She has upcoming appointment to get repeat labs drawn by hematology.  Will follow-up on repeat labs and plan at next appointment.

## 2024-07-16 NOTE — Assessment & Plan Note (Signed)
 Patient is holding all medications at this time given recent bariatric surgery.  Will reevaluate need to reintroduce medications when patient is back on full diet.

## 2024-07-16 NOTE — Telephone Encounter (Signed)
 Scheduled

## 2024-07-17 ENCOUNTER — Ambulatory Visit (INDEPENDENT_AMBULATORY_CARE_PROVIDER_SITE_OTHER): Admitting: Psychology

## 2024-07-17 DIAGNOSIS — F4310 Post-traumatic stress disorder, unspecified: Secondary | ICD-10-CM

## 2024-07-17 DIAGNOSIS — F428 Other obsessive-compulsive disorder: Secondary | ICD-10-CM

## 2024-07-17 DIAGNOSIS — F331 Major depressive disorder, recurrent, moderate: Secondary | ICD-10-CM | POA: Diagnosis not present

## 2024-07-17 NOTE — Progress Notes (Unsigned)
 [facility] Counselor/Therapist Progress Note  Patient ID: Alexandra Beck, MRN: [id number],    Date: 07/17/2024  Time spent: 57 minutes  Time In:  1:06 Time out:2:03  Treatment Type: Individual Therapy  Reported Symptoms: sadness,   Mental Status Exam: Appearance:  Casual     Behavior: Appropriate  Motor: Normal  Speech/Language:  Normal Rate  Affect: blunted  Mood: pleasant  Thought process: normal  Thought content:   WNL  Sensory/Perceptual disturbances:   WNL  Orientation: oriented to person, place, time/date, and situation  Attention: Good  Concentration: Good  Memory: WNL  Fund of knowledge:  Good  Insight:   Good  Judgment:  Good  Impulse Control: Good   Risk Assessment: Danger to Self:  No Self-injurious Behavior: No Danger to Others: No Duty to Warn:no Physical Aggression / Violence:No  Access to Firearms a concern: No  Gang Involvement:No   Subjective: The patient attended a face-to-face individual therapy session today via video visit .  The patient gave verbal consent for the video visit to be on Caregility and the patient is aware of the limitations of telehealth.  The patient was in her home alone and the therapist was in the office.   The patient presents with a blunted affect and mood is pleasant.  The patient reports that she went to see her surgeon again on Friday and she has now lost 27 pounds since the surgery a month ago.  The patient was in good spirits today and did seem to be doing better with her mood.  We talked about what she is eating and I have some concerns about her getting back into eating things that may not be the best for her diet however we will continue to monitor this and have a conversation if it appears that it might be slowing things down.  We need to talk about ways for her to offload her stress in a different way than she previously has done.  She previously has been eating her feelings and she did state that she knows  she needs to get into more movement activities so that she can deal with stress.   Interventions: Cognitive Behavioral Therapy, Assertiveness/Communication, and Problem solving, psycho education  Diagnosis:Major depressive disorder, recurrent episode, moderate (HCC)  PTSD (post-traumatic stress disorder)  Obsessional thoughts  Plan: Treatment Plan  Strengths/Abilities:  Intelligent, ability for insight, supportive wife  Treatment Preferences:  Outpatient Individual therapy  Statement of Needs:  I Need help with my depression and motivation.   Symptoms:Depressed or irritable mood.:(Status: improved). Diminished interest in or  enjoyment of activities.(Status: improved). Feelings of hopelessness,  worthlessness, or inappropriate guilt.: (Status: improved). History of chronic or recurrent depression for which the client has taken antidepressant medication, been hospitalized, had  outpatient treatment, or had a course of electroconvulsive therapy.(Status:  maintained). Lack of energy.: (Status: maintained). Low self-esteem.:  (Status: improved). Poor concentration and indecisiveness.:  (Status: improved). Social withdrawal.:  (Status: maintained).  Unresolved grief issues.: (Status: maintained).  Problems Addressed:  Unipolar Depression  Goals:  LTG:1. Alleviate depressive symptoms and return to previous level of effective  functioning.  70% 2. Appropriately grieve the loss in order to normalize mood and to return  to previously adaptive level of functioning. 3. Develop healthy interpersonal relationships that lead to the alleviation  and help prevent the relapse of depression.  70% 4. Develop healthy thinking patterns and beliefs about self, others, and the world that lead to the alleviation and help prevent the  relapse of  Depression  70% STG:1.Describe current and past experiences with depression including their impact on functioning and  attempts to resolve it.  70 % 2.  Identify and replace thoughts and beliefs that support depression.  70 % 3.Learn and implement behavioral strategies to overcome depression. 70% 4.Verbalize an understanding of healthy and unhealthy emotions with the intent of increasing the use of  healthy emotions to guide actions.  50 %  Target Date:  10/07/2024 Frequency: Bi Weekly Modality:Individual Interventions by Therapist:  CBT, problem solving, EMDR, insight oriented  Patient approved Treatment Plan  Nelton Amsden Alexandra Macintosh, LCSW

## 2024-07-22 ENCOUNTER — Inpatient Hospital Stay

## 2024-07-22 ENCOUNTER — Inpatient Hospital Stay: Attending: Hematology and Oncology | Admitting: Hematology and Oncology

## 2024-07-22 VITALS — BP 110/44 | HR 60 | Temp 98.0°F | Resp 19 | Wt 326.7 lb

## 2024-07-22 DIAGNOSIS — D509 Iron deficiency anemia, unspecified: Secondary | ICD-10-CM

## 2024-07-22 LAB — CMP (CANCER CENTER ONLY)
ALT: 6 U/L (ref 0–44)
AST: 9 U/L — ABNORMAL LOW (ref 15–41)
Albumin: 3.5 g/dL (ref 3.5–5.0)
Alkaline Phosphatase: 64 U/L (ref 38–126)
Anion gap: 6 (ref 5–15)
BUN: 29 mg/dL — ABNORMAL HIGH (ref 6–20)
CO2: 32 mmol/L (ref 22–32)
Calcium: 9 mg/dL (ref 8.9–10.3)
Chloride: 102 mmol/L (ref 98–111)
Creatinine: 1.05 mg/dL — ABNORMAL HIGH (ref 0.44–1.00)
GFR, Estimated: >60 mL/min (ref >60)
Glucose, Bld: 95 mg/dL (ref 70–99)
Potassium: 3.6 mmol/L (ref 3.5–5.1)
Sodium: 140 mmol/L (ref 135–145)
Total Bilirubin: 0.2 mg/dL (ref 0.0–1.2)
Total Protein: 6.6 g/dL (ref 6.5–8.1)

## 2024-07-22 LAB — CBC WITH DIFFERENTIAL (CANCER CENTER ONLY)
Abs Immature Granulocytes: 0.01 K/uL (ref 0.00–0.07)
Basophils Absolute: 0 K/uL (ref 0.0–0.1)
Basophils Relative: 1 %
Eosinophils Absolute: 0.2 K/uL (ref 0.0–0.5)
Eosinophils Relative: 3 %
HCT: 27 % — ABNORMAL LOW (ref 36.0–46.0)
Hemoglobin: 8.2 g/dL — ABNORMAL LOW (ref 12.0–15.0)
Immature Granulocytes: 0 %
Lymphocytes Relative: 17 %
Lymphs Abs: 1.1 K/uL (ref 0.7–4.0)
MCH: 24.2 pg — ABNORMAL LOW (ref 26.0–34.0)
MCHC: 30.4 g/dL (ref 30.0–36.0)
MCV: 79.6 fL — ABNORMAL LOW (ref 80.0–100.0)
Monocytes Absolute: 0.7 K/uL (ref 0.1–1.0)
Monocytes Relative: 11 %
Neutro Abs: 4.4 K/uL (ref 1.7–7.7)
Neutrophils Relative %: 68 %
Platelet Count: 252 K/uL (ref 150–400)
RBC: 3.39 MIL/uL — ABNORMAL LOW (ref 3.87–5.11)
RDW: 16.8 % — ABNORMAL HIGH (ref 11.5–15.5)
WBC Count: 6.4 K/uL (ref 4.0–10.5)
nRBC: 0 % (ref 0.0–0.2)

## 2024-07-22 LAB — IRON AND IRON BINDING CAPACITY (CC-WL,HP ONLY)
Iron: 20 ug/dL — ABNORMAL LOW (ref 28–170)
Saturation Ratios: 6 % — ABNORMAL LOW (ref 10.4–31.8)
TIBC: 346 ug/dL (ref 250–450)
UIBC: 326 ug/dL (ref 148–442)

## 2024-07-22 LAB — FERRITIN: Ferritin: 18 ng/mL (ref 11–307)

## 2024-07-22 NOTE — Progress Notes (Signed)
 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  Oral iron  ineffective; infusions necessary. - Administer Venofer  iron  infusions, 300 mg weekly for three weeks. - Schedule follow-up six weeks post-infusion to assess iron  stores and ferritin. - Consider additional infusion if ferritin levels remain low.  Obesity status post-Roux-en-Y gastric bypass Post-surgery with 30-pound weight loss in one month. Experiencing lightheadedness but following dietary and supplement guidelines. - Monitor weight loss and dietary intake.   She had excellent questions today about the labs. She can RTC with us  as recommended.  HISTORY OF PRESENTING ILLNESS:  Alexandra Beck 43 y.o. female is here because of anemia.   History of Present Illness    Alexandra Beck is a 43 year old female with iron  deficiency anemia who presents for pre-surgical evaluation for weight loss surgery.  Discussed the use of AI scribe software for clinical note transcription with the patient, who gave verbal consent to proceed.  History of Present Illness Alexandra Beck is a 43 year old female with iron  deficiency anemia who presents with fatigue.  She has been experiencing intense fatigue over the past few days, feeling tired regardless of sleep quality. The fatigue becomes overwhelming by mid-morning, requiring her to rest her head, whether working from home or at the office. Attempts to alleviate the fatigue by walking or splashing water on her face have been ineffective.  She  has resumed chewing ice, a habit she associates with past iron  deficiency. Her hemoglobin level is currently 8.2. She reports that her last hemoglobin was 9.2 on July 18th, after her gastric bypass surgery on July 16th. She previously received iron  infusions in April. She reports that her hemoglobin levels improved after the infusions but have since dropped again.  Her menstrual cycle is light, lasting about three days, and she denies heavy menstruation. She is not a vegetarian and is unaware of any malabsorption issues like celiac disease. She underwent a colonoscopy in December, which was normal.  She has a history of iron  deficiency anemia, previously managed with blood transfusions and iron  infusions. Her ferritin level was 23 on June 20th, which is within the normal range but on the lower end. She has experienced lightheadedness but denies headaches or passing out.  She underwent a Roux-en-Y gastric bypass on July 16th and has lost approximately 30 pounds since the surgery. She reports brittle nails and ice cravings, which she associates with her iron  deficiency. She is taking supplements and trying to eat frequently despite reduced appetite post-surgery.    All other systems were reviewed with the patient and are negative.  MEDICAL HISTORY:  Past Medical History:  Diagnosis Date   Acute renal failure (ARF) (HCC) 11/2012    multifactorial-likely secondary to ATN in the setting of sepsis and hypotension, and also from rhabdomyolysis   Anemia    Cellulitis    Chronic diastolic CHF (congestive heart failure) (HCC)    Depression    Diabetes mellitus (HCC)    GERD (gastroesophageal reflux disease)    Hyperlipidemia    Hypertension    Hypothyroidism    Morbid obesity  with BMI of 70 and over, adult (HCC)    Obesity hypoventilation syndrome (HCC)    OSA (obstructive sleep apnea)    PVC's (premature ventricular contractions)    Recurrent cellulitis of lower leg    LLE, venous insuff    Sepsis (HCC) 11/2012   Secondary to cellulitis   Sleep apnea    Venous insufficiency of leg     SURGICAL HISTORY: Past Surgical History:  Procedure Laterality Date   IR FLUORO GUIDE CV MIDLINE PICC RIGHT  03/28/2017   IR US  GUIDE VASC ACCESS RIGHT  03/28/2017   WISDOM TOOTH EXTRACTION      SOCIAL HISTORY: Social History   Socioeconomic History   Marital status: Single    Spouse name: Not on file   Number of children: 0   Years of education: Not on file   Highest education level: Not on file  Occupational History   Occupation: Firefighter: [employer]  Tobacco Use   Smoking status: Former    Current packs/day: 0.00    Average packs/day: 0.5 packs/day for 8.0 years (4.0 ttl pk-yrs)    Types: Cigarettes    Start date: 2003    Quit date: 2011    Years since quitting: 14.6   Smokeless tobacco: Never  Vaping Use   Vaping status: Never Used  Substance and Sexual Activity   Alcohol use: No    Alcohol/week: 0.0 standard drinks of alcohol   Drug use: No   Sexual activity: Not on file  Other Topics Concern   Not on file  Social History Narrative   Librarian, 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)   Social Drivers of Corporate investment banker Strain: Low Risk  (06/13/2024)   Received from [facility]   Overall Financial Resource Strain (CARDIA)    How hard is it for you to pay for the very basics like food, housing, medical care, and heating?: Not hard at all  Food Insecurity: No Food Insecurity (06/13/2024)   Received from [facility]   Hunger Vital Sign    Within the past 12 months, you worried that your food would run out before you got the money to buy more.: Never true    Within the past 12 months, the food you bought just didn't last and you didn't have money to get more.: Never true  Transportation Needs: No Transportation Needs (06/13/2024)   Received from [facility]   PRAPARE - Transportation    Lack of Transportation (Medical): No     Lack of Transportation (Non-Medical): No  Physical Activity: Not on file  Stress: Not on file  Social Connections: Not on file  Intimate Partner Violence: Not At Risk (02/12/2024)   Humiliation, Afraid, Rape, and Kick questionnaire    Fear of Current or Ex-Partner: No    Emotionally Abused: No    Physically Abused: No    Sexually Abused: No    FAMILY HISTORY: Family History  Problem Relation Age of Onset   Hypertension Mother    Diabetes Mother    High Cholesterol Mother    Thyroid  disease Mother    Sleep apnea Mother    Obesity Mother    Hypertension Father    Heart disease Father        before age 15   High Cholesterol Father    Sleep apnea Father    Obesity Father     ALLERGIES:  is allergic to aspirin, doxycycline, lisinopril,  niacin, niacin and related, niaspan [niacin er (antihyperlipidemic)], wound dressing adhesive, and wound dressings.  MEDICATIONS:  Current Outpatient Medications  Medication Sig Dispense Refill   lamoTRIgine  (LAMICTAL ) 200 MG tablet Take 1 tablet (200 mg total) by mouth daily. 30 tablet 0   levothyroxine  (SYNTHROID ) 150 MCG tablet Take 1 tablet (150 mcg total) by mouth daily before breakfast. Must keep scheduled appt for future refills 90 tablet 3   metoprolol  tartrate (LOPRESSOR ) 50 MG tablet Take 1 tablet (50 mg total) by mouth 2 (two) times daily. 180 tablet 3   omeprazole  (PRILOSEC) 20 MG capsule Take 20 mg by mouth.     rosuvastatin  (CRESTOR ) 20 MG tablet Take 1 tablet (20 mg total) by mouth daily. 90 tablet 3   sertraline  (ZOLOFT ) 100 MG tablet Take 2 tablets (200 mg total) by mouth daily. 180 tablet 1   ursodiol  (ACTIGALL ) 300 MG capsule Take 1 capsule (300 mg total) by mouth 2 (two) times daily. 60 capsule 5   fluconazole  (DIFLUCAN ) 150 MG tablet Take 1 tablet (150 mg total) by mouth every three (3) days as needed. (Patient not taking: Reported on 07/22/2024) 2 tablet 1   [Paused] losartan  (COZAAR ) 100 MG tablet Take 1 tablet (100 mg  total) by mouth daily. (Patient not taking: Reported on 07/22/2024) 90 tablet 3   metFORMIN  (GLUCOPHAGE ) 500 MG tablet Take 1 tablet (500 mg total) by mouth daily. Must keep scheduled appt for future refills (Patient not taking: Reported on 07/22/2024) 90 tablet 3   modafinil  (PROVIGIL ) 100 MG tablet Take 1 tablet (100 mg total) by mouth daily, along with 200mg  tablet. 30 tablet 3   modafinil  (PROVIGIL ) 200 MG tablet Take 1 tablet (200 mg total) by mouth daily at 9 A.M. (Patient not taking: Reported on 07/22/2024) 30 tablet 3   Semaglutide , 2 MG/DOSE, 8 MG/3ML SOPN Inject 2 mg into the skin as directed once a week. (Patient not taking: Reported on 07/22/2024) 3 mL 0   [Paused] spironolactone  (ALDACTONE ) 25 MG tablet Take 1 tablet (25 mg total) by mouth daily. (Patient not taking: Reported on 07/22/2024) 90 tablet 3   [Paused] torsemide  (DEMADEX ) 20 MG tablet Take 4 tablets (80 mg total) by mouth 2 (two) times daily as needed. (Patient not taking: Reported on 07/22/2024) 720 tablet 2   Vitamin D , Ergocalciferol , (DRISDOL ) 1.25 MG (50000 UNIT) CAPS capsule Take 1 capsule (50,000 Units total) by mouth every 7 (seven) days. (Patient not taking: Reported on 07/22/2024) 4 capsule 0   No current facility-administered medications for this visit.     PHYSICAL EXAMINATION: ECOG PERFORMANCE STATUS: 0 - Asymptomatic  Vitals:   07/22/24 1201  BP: (!) 110/44  Pulse: 60  Resp: 19  Temp: 98 F (36.7 C)  SpO2: 99%   Filed Weights   07/22/24 1201  Weight: (!) 326 lb 11.2 oz (148.2 kg)    GENERAL:alert, no distress and comfortable, morbidly obese. Appears pale. NECK: supple, thyroid  normal size, non-tender, without nodularity LYMPH:  no palpable lymphadenopathy in the cervical, axillary  LUNGS: clear to auscultation and percussion with normal breathing effort HEART: regular rate & rhythm and no murmurs and no lower extremity edema PSYCH: alert & oriented x 3 with fluent speech   LABORATORY DATA:  I have  reviewed the data as listed Lab Results  Component Value Date   WBC 6.4 07/22/2024   HGB 8.2 (L) 07/22/2024   HCT 27.0 (L) 07/22/2024   MCV 79.6 (L) 07/22/2024   PLT 252 07/22/2024  Chemistry      Component Value Date/Time   NA 140 07/22/2024 1127   NA 142 11/13/2023 1325   K 3.6 07/22/2024 1127   CL 102 07/22/2024 1127   CO2 32 07/22/2024 1127   BUN 29 (H) 07/22/2024 1127   BUN 20 11/13/2023 1325   CREATININE 1.05 (H) 07/22/2024 1127   CREATININE 1.06 11/14/2016 1050      Component Value Date/Time   CALCIUM  9.0 07/22/2024 1127   CALCIUM  9.6 06/26/2007 0000   ALKPHOS 64 07/22/2024 1127   AST 9 (L) 07/22/2024 1127   ALT 6 07/22/2024 1127   BILITOT 0.2 07/22/2024 1127       RADIOGRAPHIC STUDIES: I have personally reviewed the radiological images as listed and agreed with the findings in the report. No results found.   All questions were answered. The patient knows to call the clinic with any problems, questions or concerns. I spent 30 minutes in the care of this patient including H and P, review of records, counseling and coordination of care.     Amber Stalls, MD 07/22/2024 12:18 PM

## 2024-07-24 ENCOUNTER — Ambulatory Visit: Payer: Self-pay | Admitting: Hematology and Oncology

## 2024-07-24 ENCOUNTER — Other Ambulatory Visit: Payer: Self-pay

## 2024-07-24 ENCOUNTER — Other Ambulatory Visit (HOSPITAL_COMMUNITY): Payer: Self-pay

## 2024-07-24 ENCOUNTER — Other Ambulatory Visit: Payer: Self-pay | Admitting: Internal Medicine

## 2024-07-24 ENCOUNTER — Ambulatory Visit (INDEPENDENT_AMBULATORY_CARE_PROVIDER_SITE_OTHER): Admitting: Psychology

## 2024-07-24 DIAGNOSIS — E039 Hypothyroidism, unspecified: Secondary | ICD-10-CM

## 2024-07-24 DIAGNOSIS — F331 Major depressive disorder, recurrent, moderate: Secondary | ICD-10-CM | POA: Diagnosis not present

## 2024-07-24 DIAGNOSIS — F4310 Post-traumatic stress disorder, unspecified: Secondary | ICD-10-CM | POA: Diagnosis not present

## 2024-07-24 DIAGNOSIS — F422 Mixed obsessional thoughts and acts: Secondary | ICD-10-CM

## 2024-07-24 DIAGNOSIS — F428 Other obsessive-compulsive disorder: Secondary | ICD-10-CM

## 2024-07-24 NOTE — Progress Notes (Signed)
  Behavioral Health Counselor/Therapist Progress Note  Patient ID: HILDAGARD SOBECKI, MRN: [id number],    Date: 07/24/2024  Time spent: 53 minutes  Time In:  1:07 Time out:2:00  Treatment Type: Individual Therapy  Reported Symptoms: sadness,   Mental Status Exam: Appearance:  Casual     Behavior: Appropriate  Motor: Normal  Speech/Language:  Normal Rate  Affect: blunted  Mood: sad  Thought process: normal  Thought content:   WNL  Sensory/Perceptual disturbances:   WNL  Orientation: oriented to person, place, time/date, and situation  Attention: Good  Concentration: Good  Memory: WNL  Fund of knowledge:  Good  Insight:   Good  Judgment:  Good  Impulse Control: Good   Risk Assessment: Danger to Self:  No Self-injurious Behavior: No Danger to Others: No Duty to Warn:no Physical Aggression / Violence:No  Access to Firearms a concern: No  Gang Involvement:No   Subjective: The patient attended a face-to-face individual therapy session today via video visit .  The patient gave verbal consent for the video visit to be on Caregility and the patient is aware of the limitations of telehealth.  The patient was in her home alone and the therapist was in the office.   The patient presents with a blunted affect and mood is somewhat sad.  The patient talked about having a realization that she is struggling somewhat with losing food as her coping strategy.  She did become tearful when I explained to her that it was normal for her to feel this way after the surgery and that we would have to come up with a different way of coping than eating at this point.  She talked about having enough situation happen with some eggs that she made and feeling very frustrated.  We explored some options of possibly crafting and talked a bit about movement to some degree.  We will continue to work with her on dealing with her feelings associated with not being able to eat to cope.  Interventions: Cognitive  Behavioral Therapy, Assertiveness/Communication, and Problem solving, psycho education  Diagnosis:Major depressive disorder, recurrent episode, moderate (HCC)  PTSD (post-traumatic stress disorder)  Obsessional thoughts  Plan: Treatment Plan  Strengths/Abilities:  Intelligent, ability for insight, supportive wife  Treatment Preferences:  Outpatient Individual therapy  Statement of Needs:  I Need help with my depression and motivation.   Symptoms:Depressed or irritable mood.:(Status: improved). Diminished interest in or  enjoyment of activities.(Status: improved). Feelings of hopelessness,  worthlessness, or inappropriate guilt.: (Status: improved). History of chronic or recurrent depression for which the client has taken antidepressant medication, been hospitalized, had  outpatient treatment, or had a course of electroconvulsive therapy.(Status:  maintained). Lack of energy.: (Status: maintained). Low self-esteem.:  (Status: improved). Poor concentration and indecisiveness.:  (Status: improved). Social withdrawal.:  (Status: maintained).  Unresolved grief issues.: (Status: maintained).  Problems Addressed:  Unipolar Depression  Goals:  LTG:1. Alleviate depressive symptoms and return to previous level of effective  functioning.  70% 2. Appropriately grieve the loss in order to normalize mood and to return  to previously adaptive level of functioning. 3. Develop healthy interpersonal relationships that lead to the alleviation  and help prevent the relapse of depression.  70% 4. Develop healthy thinking patterns and beliefs about self, others, and the world that lead to the alleviation and help prevent the relapse of  Depression  70% STG:1.Describe current and past experiences with depression including their impact on functioning and  attempts to resolve it.  70 % 2.  Identify and replace thoughts and beliefs that support depression.  70 % 3.Learn and implement behavioral  strategies to overcome depression. 70% 4.Verbalize an understanding of healthy and unhealthy emotions with the intent of increasing the use of  healthy emotions to guide actions.  50 %  Target Date:  10/07/2024 Frequency: Bi Weekly Modality:Individual Interventions by Therapist:  CBT, problem solving, EMDR, insight oriented  Patient approved Treatment Plan  Fae Blossom [name], LCSW

## 2024-07-25 ENCOUNTER — Other Ambulatory Visit: Payer: Self-pay

## 2024-07-25 ENCOUNTER — Other Ambulatory Visit (HOSPITAL_COMMUNITY): Payer: Self-pay

## 2024-07-25 MED ORDER — LEVOTHYROXINE SODIUM 150 MCG PO TABS
150.0000 ug | ORAL_TABLET | Freq: Every day | ORAL | 3 refills | Status: DC
  Filled 2024-07-25: qty 90, 90d supply, fill #0

## 2024-07-26 ENCOUNTER — Inpatient Hospital Stay

## 2024-07-26 ENCOUNTER — Encounter: Payer: Self-pay | Admitting: Adult Health

## 2024-07-26 ENCOUNTER — Other Ambulatory Visit (HOSPITAL_COMMUNITY): Payer: Self-pay

## 2024-07-26 ENCOUNTER — Inpatient Hospital Stay: Admitting: Hematology and Oncology

## 2024-07-26 ENCOUNTER — Telehealth (INDEPENDENT_AMBULATORY_CARE_PROVIDER_SITE_OTHER): Payer: Self-pay | Admitting: Adult Health

## 2024-07-26 DIAGNOSIS — G4700 Insomnia, unspecified: Secondary | ICD-10-CM

## 2024-07-26 DIAGNOSIS — F4310 Post-traumatic stress disorder, unspecified: Secondary | ICD-10-CM

## 2024-07-26 DIAGNOSIS — F428 Other obsessive-compulsive disorder: Secondary | ICD-10-CM

## 2024-07-26 DIAGNOSIS — F331 Major depressive disorder, recurrent, moderate: Secondary | ICD-10-CM

## 2024-07-26 DIAGNOSIS — F32A Depression, unspecified: Secondary | ICD-10-CM | POA: Diagnosis not present

## 2024-07-26 MED ORDER — CARIPRAZINE HCL 1.5 MG PO CAPS
1.5000 mg | ORAL_CAPSULE | Freq: Every day | ORAL | 2 refills | Status: DC
  Filled 2024-07-26 – 2024-08-05 (×2): qty 30, 30d supply, fill #0
  Filled 2024-09-12: qty 30, 30d supply, fill #1
  Filled 2024-09-27 – 2024-10-05 (×2): qty 30, 30d supply, fill #2

## 2024-07-26 NOTE — Progress Notes (Signed)
 Alexandra Beck [id number] November 19, 1981 43 y.o.  Virtual Visit via Video Note  I connected with pt @ on 07/26/24 at  3:00 PM EDT by a video enabled telemedicine application and verified that I am speaking with the correct person using two identifiers.   I discussed the limitations of evaluation and management by telemedicine and the availability of in person appointments. The patient expressed understanding and agreed to proceed.  I discussed the assessment and treatment plan with the patient. The patient was provided an opportunity to ask questions and all were answered. The patient agreed with the plan and demonstrated an understanding of the instructions.   The patient was advised to call back or seek an in-person evaluation if the symptoms worsen or if the condition fails to improve as anticipated.  I provided 25 minutes of non-face-to-face time during this encounter.  The patient was located at home.  The provider was located at [facility].   Alexandra LOISE Sayers, NP   Subjective:   Patient ID:  Alexandra Beck is a 43 y.o. (DOB 19-Apr-1981) female.  Chief Complaint: No chief complaint on file.   HPI Alexandra Beck presents for follow-up of insomnia, obsessional thoughts and depression.  Describes mood today as not too good. Pleasant. Decreased tearfulness. Mood symptoms - reports depression, anxiety, and irritability. Reports lower interest and motivation. Denies recent panic attack. Reports worry, rumination, and over thinking. Reports some obsessive acts and thoughts. Reports some intrusive thoughts. Reports mood as lower. Stating I feel like I'm struggling. Feels like current medications are helpful, but is willing to consider other options. Seeing therapist - Alexandra Beck for therapy. Taking medications as prescribed. Energy levels lower. Active, does not have a regular exercise routine.  Enjoys some usual interests and activities. Married. Lives with wife and 3 cats.  Spending time with family. Gaming online with friends. Appetite adequate. Weight loss with recent surgery. Sleeping well most nights. Averages 7 to 8 hours. Focus and concentration stable. Completing tasks. Managing minimal aspects of household. Works 12 to 9 - at [employer].  Denies SI or HI.  Denies AH or VH. Denies self harm. Denies substance use. Seeing Alexandra Beck for therapy.  Previous medication trials: Trialed on Wellbutrin  at age 27, Trintellix -weird dreams, Latuda , Rexulti ,    Review of Systems:  Review of Systems  Musculoskeletal:  Negative for gait problem.  Neurological:  Negative for tremors.  Psychiatric/Behavioral:         Please refer to HPI    Medications: I have reviewed the patient's current medications.  Current Outpatient Medications  Medication Sig Dispense Refill   fluconazole  (DIFLUCAN ) 150 MG tablet Take 1 tablet (150 mg total) by mouth every three (3) days as needed. (Patient not taking: Reported on 07/22/2024) 2 tablet 1   lamoTRIgine  (LAMICTAL ) 200 MG tablet Take 1 tablet (200 mg total) by mouth daily. 30 tablet 0   levothyroxine  (SYNTHROID ) 150 MCG tablet Take 1 tablet (150 mcg total) by mouth daily before breakfast. Must keep scheduled appt for future refills 90 tablet 3   [Paused] losartan  (COZAAR ) 100 MG tablet Take 1 tablet (100 mg total) by mouth daily. (Patient not taking: Reported on 07/22/2024) 90 tablet 3   metFORMIN  (GLUCOPHAGE ) 500 MG tablet Take 1 tablet (500 mg total) by mouth daily. Must keep scheduled appt for future refills (Patient not taking: Reported on 07/22/2024) 90 tablet 3   metoprolol  tartrate (LOPRESSOR ) 50 MG tablet Take 1 tablet (50 mg total) by mouth  2 (two) times daily. 180 tablet 3   modafinil  (PROVIGIL ) 100 MG tablet Take 1 tablet (100 mg total) by mouth daily, along with 200mg  tablet. 30 tablet 3   modafinil  (PROVIGIL ) 200 MG tablet Take 1 tablet (200 mg total) by mouth daily at 9 A.M. (Patient not taking: Reported on  07/22/2024) 30 tablet 3   omeprazole  (PRILOSEC) 20 MG capsule Take 20 mg by mouth.     rosuvastatin  (CRESTOR ) 20 MG tablet Take 1 tablet (20 mg total) by mouth daily. 90 tablet 3   Semaglutide , 2 MG/DOSE, 8 MG/3ML SOPN Inject 2 mg into the skin as directed once a week. (Patient not taking: Reported on 07/22/2024) 3 mL 0   sertraline  (ZOLOFT ) 100 MG tablet Take 2 tablets (200 mg total) by mouth daily. 180 tablet 1   [Paused] spironolactone  (ALDACTONE ) 25 MG tablet Take 1 tablet (25 mg total) by mouth daily. (Patient not taking: Reported on 07/22/2024) 90 tablet 3   [Paused] torsemide  (DEMADEX ) 20 MG tablet Take 4 tablets (80 mg total) by mouth 2 (two) times daily as needed. (Patient not taking: Reported on 07/22/2024) 720 tablet 2   ursodiol  (ACTIGALL ) 300 MG capsule Take 1 capsule (300 mg total) by mouth 2 (two) times daily. 60 capsule 5   Vitamin D , Ergocalciferol , (DRISDOL ) 1.25 MG (50000 UNIT) CAPS capsule Take 1 capsule (50,000 Units total) by mouth every 7 (seven) days. (Patient not taking: Reported on 07/22/2024) 4 capsule 0   No current facility-administered medications for this visit.    Medication Side Effects: None  Allergies:  Allergies  Allergen Reactions   Aspirin Hives, Swelling and Other (See Comments)    REACTION: throat swelling, hives  Other reaction(s): Unknown  REACTION: throat swelling, hives Other reaction(s): Unknown Other reaction(s): Unknown   Doxycycline Other (See Comments)    Abdominal pain, difficulty swallowing   Lisinopril Cough   Niacin     Other reaction(s): Unknown Other reaction(s): Unknown   Niacin And Related     Other reaction(s): Unknown Other reaction(s): Unknown   Niaspan [Niacin Er (Antihyperlipidemic)]     Caused flushing   Wound Dressing Adhesive Itching    Welts and burning of the skin   Wound Dressings Itching    Welts and burning of the skin    Past Medical History:  Diagnosis Date   Acute renal failure (ARF) (HCC) 11/2012     multifactorial-likely secondary to ATN in the setting of sepsis and hypotension, and also from rhabdomyolysis   Anemia    Cellulitis    Chronic diastolic CHF (congestive heart failure) (HCC)    Depression    Diabetes mellitus (HCC)    GERD (gastroesophageal reflux disease)    Hyperlipidemia    Hypertension    Hypothyroidism    Morbid obesity with BMI of 70 and over, adult (HCC)    Obesity hypoventilation syndrome (HCC)    OSA (obstructive sleep apnea)    PVC's (premature ventricular contractions)    Recurrent cellulitis of lower leg    LLE, venous insuff   Sepsis (HCC) 11/2012   Secondary to cellulitis   Sleep apnea    Venous insufficiency of leg     Family History  Problem Relation Age of Onset   Hypertension Mother    Diabetes Mother    High Cholesterol Mother    Thyroid  disease Mother    Sleep apnea Mother    Obesity Mother    Hypertension Father    Heart disease Father  before age 61   High Cholesterol Father    Sleep apnea Father    Obesity Father     Social History   Socioeconomic History   Marital status: Single    Spouse name: Not on file   Number of children: 0   Years of education: Not on file   Highest education level: Not on file  Occupational History   Occupation: Librarian    Employer: [employer]  Tobacco Use   Smoking status: Former    Current packs/day: 0.00    Average packs/day: 0.5 packs/day for 8.0 years (4.0 ttl pk-yrs)    Types: Cigarettes    Start date: 2003    Quit date: 2011    Years since quitting: 14.6   Smokeless tobacco: Never  Vaping Use   Vaping status: Never Used  Substance and Sexual Activity   Alcohol use: No    Alcohol/week: 0.0 standard drinks of alcohol   Drug use: No   Sexual activity: Not on file  Other Topics Concern   Not on file  Social History Narrative   Librarian, 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)   Social Drivers of Corporate investment banker Strain: Low Risk  (06/13/2024)    Received from [facility]   Overall Financial Resource Strain (CARDIA)    How hard is it for you to pay for the very basics like food, housing, medical care, and heating?: Not hard at all  Food Insecurity: No Food Insecurity (06/13/2024)   Received from [facility]   Hunger Vital Sign    Within the past 12 months, you worried that your food would run out before you got the money to buy more.: Never true    Within the past 12 months, the food you bought just didn't last and you didn't have money to get more.: Never true  Transportation Needs: No Transportation Needs (06/13/2024)   [facility] from [facility]   PRAPARE - Transportation    Lack of Transportation (Medical): No    Lack of Transportation (Non-Medical): No  Physical Activity: Not on file  Stress: Not on file  Social Connections: Not on file  Intimate Partner Violence: Not At Risk (02/12/2024)   Humiliation, Afraid, Rape, and Kick questionnaire    Fear of Current or Ex-Partner: No    Emotionally Abused: No    Physically Abused: No    Sexually Abused: No    Past Medical History, Surgical history, Social history, and Family history were reviewed and updated as appropriate.   Please see review of systems for further details on the patient's review from today.   Objective:   Physical Exam:  There were no vitals taken for this visit.  Physical Exam Constitutional:      General: She is not in acute distress. Musculoskeletal:        General: No deformity.  Neurological:     Mental Status: She is alert and oriented to person, place, and time.     Coordination: Coordination normal.  Psychiatric:        Attention and Perception: Attention and perception normal. She does not perceive auditory or visual hallucinations.        Mood and Affect: Mood normal. Mood is not anxious or depressed. Affect is not labile, blunt, angry or inappropriate.        Speech: Speech normal.        Behavior: Behavior normal.         Thought  Content: Thought content normal. Thought content is not paranoid or delusional. Thought content does not include homicidal or suicidal ideation. Thought content does not include homicidal or suicidal plan.        Cognition and Memory: Cognition and memory normal.        Judgment: Judgment normal.     Comments: Insight intact     Lab Review:     Component Value Date/Time   NA 140 07/22/2024 1127   NA 142 11/13/2023 1325   K 3.6 07/22/2024 1127   CL 102 07/22/2024 1127   CO2 32 07/22/2024 1127   GLUCOSE 95 07/22/2024 1127   BUN 29 (H) 07/22/2024 1127   BUN 20 11/13/2023 1325   CREATININE 1.05 (H) 07/22/2024 1127   CREATININE 1.06 11/14/2016 1050   CALCIUM  9.0 07/22/2024 1127   CALCIUM  9.6 06/26/2007 0000   PROT 6.6 07/22/2024 1127   PROT 6.6 11/13/2023 1325   ALBUMIN  3.5 07/22/2024 1127   ALBUMIN  3.7 (L) 11/13/2023 1325   AST 9 (L) 07/22/2024 1127   ALT 6 07/22/2024 1127   ALKPHOS 64 07/22/2024 1127   BILITOT 0.2 07/22/2024 1127   GFRNONAA >60 07/22/2024 1127   GFRAA 81 11/11/2020 1211       Component Value Date/Time   WBC 6.4 07/22/2024 1127   WBC 7.8 02/10/2024 0342   RBC 3.39 (L) 07/22/2024 1127   HGB 8.2 (L) 07/22/2024 1127   HGB 8.7 (L) 10/13/2023 1058   HCT 27.0 (L) 07/22/2024 1127   HCT 27.4 (L) 11/13/2023 1325   PLT 252 07/22/2024 1127   PLT 316 10/13/2023 1058   MCV 79.6 (L) 07/22/2024 1127   MCV 88 10/13/2023 1058   MCH 24.2 (L) 07/22/2024 1127   MCHC 30.4 07/22/2024 1127   RDW 16.8 (H) 07/22/2024 1127   RDW 15.4 10/13/2023 1058   LYMPHSABS 1.1 07/22/2024 1127   LYMPHSABS 1.2 03/13/2023 1511   MONOABS 0.7 07/22/2024 1127   EOSABS 0.2 07/22/2024 1127   EOSABS 0.2 03/13/2023 1511   BASOSABS 0.0 07/22/2024 1127   BASOSABS 0.0 03/13/2023 1511    No results found for: POCLITH, LITHIUM   No results found for: PHENYTOIN, PHENOBARB, VALPROATE, CBMZ   .res Assessment: Plan:    Plan:   PDMP reviewed  Add Vraylar  1.5mg  daily or  MDD  Zoloft  100mg  daily - take two tablets daily Lamictal  200mg  at hs  RTC 4 weeks  Patient advised to contact office with any questions, adverse effects, or acute worsening in signs and symptoms.  Time spent with patient was 20 minutes. Greater than 50% of face to face time with patient was spent on counseling and coordination of care.    Discussed potential metabolic side effects associated with atypical antipsychotics, as well as potential risk for movement side effects. Advised pt to contact office if movement side effects occur.  There are no diagnoses linked to this encounter.   Please see After Visit Summary for patient specific instructions.  Future Appointments  Date Time Provider Department Center  07/26/2024  3:00 PM Avriana Joo, Alexandra Mattocks, NP CP-CP None  08/02/2024  2:00 PM Lonni Slain, MD [facility] [id number]  08/05/2024  9:30 AM [facility] FLUSH [facility] None  08/05/2024  1:30 PM Leonce Katz, DO [facility] None  08/07/2024  1:00 PM Beck, Alexandra G, LCSW [facility] None  08/12/2024 10:00 AM [facility] FLUSH [facility] None  08/14/2024  1:00 PM Beck, Alexandra G, LCSW [facility] None  08/19/2024 10:00 AM [facility] FLUSH [facility]  None  08/19/2024 12:20 PM [place] Adelita PENNER, MD [facility]-MWM None  08/21/2024  1:00 PM Beck, Alexandra G, LCSW [facility] None  08/23/2024 10:40 AM Norleen Lynwood ORN, MD [facility]  08/28/2024  1:00 PM Beck, Peggye MATSU, LCSW [facility] None  09/04/2024  1:00 PM Beck, Alexandra G, LCSW [facility] None  09/11/2024  1:00 PM Beck, Alexandra G, LCSW [facility] None  09/16/2024 11:30 AM [facility]ONC LAB [facility]MED[facility] None  09/16/2024 12:15 PM Iruku, Amber, MD [facility]MED[facility] None  09/18/2024  1:00 PM Beck, Alexandra G, LCSW [facility] None  09/25/2024  1:00 PM Beck, Alexandra G, LCSW [facility] None    No orders of the defined types were placed in this encounter.     -------------------------------

## 2024-07-30 ENCOUNTER — Other Ambulatory Visit (HOSPITAL_COMMUNITY): Payer: Self-pay

## 2024-07-31 ENCOUNTER — Other Ambulatory Visit (HOSPITAL_COMMUNITY): Payer: Self-pay

## 2024-07-31 ENCOUNTER — Telehealth (HOSPITAL_COMMUNITY): Payer: Self-pay | Admitting: Pharmacy Technician

## 2024-07-31 NOTE — Telephone Encounter (Signed)
 Auth Submission: NO AUTH NEEDED Site of care: [facility] Payer: [employer] Medication & CPT/J Code(s) submitted: Venofer  (Iron  Sucrose) J1756 Diagnosis Code: D50.9 Route of submission (phone, fax, portal): portal Phone # Fax # Auth type: Buy/Bill HB Units/visits requested: 300mg  x 3 doses  Reference number:  Approval from: 07/31/24 to 10/30/24      Dagoberto Armour, CPhT [facility] Phone: [phone] 07/31/2024

## 2024-08-01 ENCOUNTER — Telehealth: Payer: Self-pay

## 2024-08-01 ENCOUNTER — Other Ambulatory Visit: Payer: Self-pay | Admitting: Hematology and Oncology

## 2024-08-01 ENCOUNTER — Other Ambulatory Visit (HOSPITAL_COMMUNITY): Payer: Self-pay

## 2024-08-01 NOTE — Telephone Encounter (Signed)
 Dr. Loretha, patient will be scheduled as soon as possible.  Auth Submission: NO AUTH NEEDED Site of care: Site of care: [facility] Payer: [employer] state health plan Medication & CPT/J Code(s) submitted: Venofer  (Iron  Sucrose) J1756 Diagnosis Code:  Route of submission (phone, fax, portal):  Phone # Fax # Auth type: Buy/Bill PB Units/visits requested: 300mg  x 3 doses Reference number:  Approval from: 08/01/24 to 10/31/24

## 2024-08-02 ENCOUNTER — Other Ambulatory Visit (HOSPITAL_BASED_OUTPATIENT_CLINIC_OR_DEPARTMENT_OTHER): Payer: Self-pay

## 2024-08-02 ENCOUNTER — Other Ambulatory Visit: Payer: Self-pay

## 2024-08-02 ENCOUNTER — Telehealth: Payer: Self-pay

## 2024-08-02 ENCOUNTER — Other Ambulatory Visit (HOSPITAL_COMMUNITY): Payer: Self-pay

## 2024-08-02 ENCOUNTER — Encounter (HOSPITAL_BASED_OUTPATIENT_CLINIC_OR_DEPARTMENT_OTHER): Payer: Self-pay | Admitting: Cardiology

## 2024-08-02 ENCOUNTER — Ambulatory Visit (HOSPITAL_BASED_OUTPATIENT_CLINIC_OR_DEPARTMENT_OTHER): Admitting: Cardiology

## 2024-08-02 VITALS — BP 122/68 | HR 66 | Ht 62.0 in | Wt 322.0 lb

## 2024-08-02 DIAGNOSIS — I5032 Chronic diastolic (congestive) heart failure: Secondary | ICD-10-CM

## 2024-08-02 DIAGNOSIS — I493 Ventricular premature depolarization: Secondary | ICD-10-CM

## 2024-08-02 DIAGNOSIS — E1169 Type 2 diabetes mellitus with other specified complication: Secondary | ICD-10-CM

## 2024-08-02 DIAGNOSIS — Z7189 Other specified counseling: Secondary | ICD-10-CM

## 2024-08-02 MED ORDER — SPIRONOLACTONE 25 MG PO TABS
12.5000 mg | ORAL_TABLET | Freq: Every day | ORAL | 3 refills | Status: AC
  Filled 2024-08-02: qty 45, 90d supply, fill #0
  Filled 2024-11-06: qty 45, 90d supply, fill #1

## 2024-08-02 MED ORDER — METOPROLOL SUCCINATE ER 50 MG PO TB24
50.0000 mg | ORAL_TABLET | Freq: Every day | ORAL | 3 refills | Status: AC
  Filled 2024-08-02: qty 90, 90d supply, fill #0
  Filled 2024-11-06: qty 90, 90d supply, fill #1

## 2024-08-02 NOTE — Progress Notes (Signed)
 Cardiology Office Note:  .   Date:  08/02/2024  ID:  Alexandra Beck, DOB 1980/12/07, MRN [id number] PCP: Norleen Lynwood ORN, MD  [facility] Providers Cardiologist:  Shelda Bruckner, MD {  History of Present Illness: .   Alexandra Beck is a 43 y.o. female with PMH type II diabetes, hyperlipidemia, severe morbid obesity with suspected OHS/OSA on CPAP, chronic diastolic heart failure. She was previously followed by Dr. Dann and established care with me on 08/02/24.  Pertinent CV history: echo 04/2022 showed EF 55-60%, normal RV, mild LAE, technically difficult due to habitus. She had remote ETT 2018 which was nondiagnostic due to failure to achieve THR, poor exercise tolerance with TWI at rest not changed with stress; occ PVCs   Today: Wants to discuss meds (diuretic, metoprolol , ARB). Wondering if she is due for a recheck of her echo.   Day to day, doesn't have symptoms beyond occasional leg swelling. Notes that during hospital admissions she does get fluid overloaded because her diuretics are typically held.   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 When she was discharged post op, her torsemide  was decreased to 10 mg daily, spironolactone  was stopped, losartan  was decreased (though she has not been taking due to miscommunication), and metoprolol  was changed to tartrate so that she could crush it.  She noted that post op her weight wasn't budging, torsemide  was increased to 60 mg and she lost 5 lbs in 1 day. Current regimen is 120 mg torsemide  in the morning. Has not been back on spironolactone  since surgery. Blood pressure normal today, off of losartan  and spironolactone .   Preoperative weight based on chart was 355 lbs. Down about 30 lbs.  Checking blood pressure at home, has been running as low as 111/58. HR has been 55-63 at home. This is lower than her preop numbers.   ROS: Denies chest pain, shortness of breath at rest or with normal exertion.  No PND, orthopnea, LE edema or unexpected weight gain. No syncope or palpitations. ROS otherwise negative except as noted.   Studies Reviewed: SABRA    EKG:  EKG Interpretation Date/Time:  Friday August 02 2024 14:34:02 EDT Ventricular Rate:  55 PR Interval:  158 QRS Duration:  102 QT Interval:  436 QTC Calculation: 417 R Axis:   -23  Text Interpretation: Sinus bradycardia Confirmed by Bruckner Shelda [phone]) on 08/02/2024 3:57:31 PM    Physical Exam:   VS:  BP 122/68 (BP Location: Left Arm, Patient Position: Sitting, Cuff Size: Large)   Pulse 66   Ht 5' 2 (1.575 m)   Wt (!) 322 lb (146.1 kg)   SpO2 95%   BMI 58.89 kg/m    Wt Readings from Last 3 Encounters:  08/02/24 (!) 322 lb (146.1 kg)  07/22/24 (!) 326 lb 11.2 oz (148.2 kg)  07/15/24 (!) 325 lb (147.4 kg)    GEN: Well nourished, well developed in no acute distress HEENT: Normal, moist mucous membranes NECK: No JVD visualized CARDIAC: regular rhythm, normal S1 and S2, no rubs or gallops. No murmur. VASCULAR: Radial and DP pulses 2+ bilaterally. No carotid bruits RESPIRATORY:  Clear to auscultation without rales, wheezing or rhonchi  ABDOMEN: Soft, non-tender, non-distended MUSCULOSKELETAL:  Ambulates independently SKIN: Warm and dry, no pitting edema, chronic firm lymphedema L >R NEUROLOGIC:  Alert and oriented x 3. No focal neuro deficits noted. PSYCHIATRIC:  Normal affect    ASSESSMENT AND PLAN: .    Chronic diastolic heart failure History  of hypertension, now running lower post gastric bypass -stop losartan  completely -discussed spironolactone . She feels this may have been helping her diuresis before, but I am concerned that this may lower her blood pressure too much. -we will start back at 12.5 mg spironolactone  and monitor her blood pressure. If it remains stable and she is asymptomatic, then we can increase to 25 mg daily. Will need to recheck BMET in 2 weeks -discussed her torsemide  dose. Currently  taking 120 mg daily. Would not use furosemide  given recent abdominal surgery. Discussed finding right dose to keep good urine output without overdoing it. Recent BUN was up slightly, Cr stable. She is hydrating well.  Morbid obesity with suspected OHS/OSA on CPAP Type II diabetes Hyperlipidemia -BMI ~59 today -metformin , ozempic  on hold post op -on rosuvastatin  for primary prevention  PVCs -has been well controlled on metoprolol  succinate 100 mg daily prior to surgery and then metoprolol  tartate 50 mg BID post op.  -has been running slower post op. Will change to metoprolol  succinate 50 mg daily, monitor for worsening symptoms  CV risk counseling and prevention -recommend heart healthy/Mediterranean diet, with whole grains, fruits, vegetable, fish, lean meats, nuts, and olive oil. Limit salt. -recommend moderate walking, 3-5 times/week for 30-50 minutes each session. Aim for at least 150 minutes/week. Goal should be pace of 3 miles/hours, or walking 1.5 miles in 30 minutes -recommend avoidance of tobacco products. Avoid excess alcohol. -ASCVD risk score: The 10-year ASCVD risk score (Arnett DK, et al., 2019) is: 1.3%   Values used to calculate the score:     Age: 60 years     Clincally relevant sex: Female     Is Non-Hispanic African American: No     Diabetic: Yes     Tobacco smoker: No     Systolic Blood Pressure: 122 mmHg     Is BP treated: Yes     HDL Cholesterol: 58 mg/dL     Total Cholesterol: 176 mg/dL    Dispo: 6 mos or sooner as needed  Total time of encounter: I spent 75 minutes dedicated to the care of this patient on the date of this encounter to include pre-visit review of records, face-to-face time with the patient discussing conditions above, and clinical documentation with the electronic health record. We specifically spent time today discussing diastolic heart failure, medications, recommendations for diet and exercise, change of medications   Signed, Shelda Bruckner, MD   Shelda Bruckner, MD, PhD, [facility] [place]  [facility]  [place]  [facility] & Vascular at [facility] at [facility] [address], [address] [place], [place] [address] [phone]

## 2024-08-02 NOTE — Progress Notes (Signed)
 Alexandra Beck Alexandra Beck Alexandra [facility] [address] [place] [address] Phone: [phone]   Assessment and Plan:     1. Strain of tendon of right foot and ankle, sequela (Primary) 2. Chronic pain of right ankle 3. Right foot pain 4. Degeneration of intervertebral disc of lumbar region with lower extremity pain -Chronic with exacerbation, subsequent visit - Intermittent, intense pain of right ankle and foot without MOI and primarily occurring upon awakening.  Suspicious for lumbar radiculopathy due to pain occurring in the morning before weightbearing and no specific MOI, and degenerative changes seen on prior lumbar MRI - Recommend right sided epidural CSI to L5-S1 - Recommend starting HEP and physical therapy targeting low back and right foot and ankle - X-rays obtained in clinic.  My interpretation: No acute fracture or dislocation.  Degenerative changes of navicular bone, medial and lateral ankle joint space, posterior patellar enthesophyte    Pertinent previous records reviewed include lumbar epidural 2023   Follow Up: 2 weeks after epidural to review benefit from physical therapy and epidural.  Could consider repeat epidural versus focusing on physical therapy   Subjective:   I, Chestine Reeves, am serving as a Neurosurgeon for Doctor Morene Mace   Chief Complaint:intermittent right ankle pain    HPI:   08/05/2024 Patient states intermittent right ankle pain. Pain starts in the achilles and moves through the medial aspect of the foot. Pain generally comes in the am. A year of pain. Decreased ROM. When in flare she isnt able to walk and or go to work. No MOI. When in flare she is not able to do ADLs. Feels like her ankle is being crushed when she is in flare. She states she feels hobbled   Also has flares of carpal tunnel .    Relevant Historical Information: Elevated BMI, hypertension, OSA, DM type II  Additional pertinent review of systems  negative.   Current Outpatient Medications:    cariprazine  (VRAYLAR ) 1.5 MG capsule, Take 1 capsule (1.5 mg total) by mouth daily., Disp: 30 capsule, Rfl: 2   fluconazole  (DIFLUCAN ) 150 MG tablet, Take 1 tablet (150 mg total) by mouth every three (3) days as needed. (Patient not taking: Reported on 08/02/2024), Disp: 2 tablet, Rfl: 1   lamoTRIgine  (LAMICTAL ) 200 MG tablet, Take 1 tablet (200 mg total) by mouth daily., Disp: 30 tablet, Rfl: 0   levothyroxine  (SYNTHROID ) 150 MCG tablet, Take 1 tablet (150 mcg total) by mouth daily before breakfast. Must keep scheduled appt for future refills, Disp: 90 tablet, Rfl: 3   metFORMIN  (GLUCOPHAGE ) 500 MG tablet, Take 1 tablet (500 mg total) by mouth daily. Must keep scheduled appt for future refills (Patient not taking: Reported on 07/22/2024), Disp: 90 tablet, Rfl: 3   metoprolol  succinate (TOPROL -XL) 50 MG 24 hr tablet, Take 1 tablet (50 mg total) by mouth daily. Take with or immediately following a meal., Disp: 90 tablet, Rfl: 3   modafinil  (PROVIGIL ) 100 MG tablet, Take 1 tablet (100 mg total) by mouth daily, along with 200mg  tablet., Disp: 30 tablet, Rfl: 3   modafinil  (PROVIGIL ) 200 MG tablet, Take 1 tablet (200 mg total) by mouth daily at 9 A.M., Disp: 30 tablet, Rfl: 3   omeprazole  (PRILOSEC) 20 MG capsule, Take 20 mg by mouth., Disp: , Rfl:    rosuvastatin  (CRESTOR ) 20 MG tablet, Take 1 tablet (20 mg total) by mouth daily., Disp: 90 tablet, Rfl: 3   Semaglutide , 2 MG/DOSE, 8 MG/3ML SOPN, Inject  2 mg into the skin as directed once a week. (Patient not taking: Reported on 07/22/2024), Disp: 3 mL, Rfl: 0   sertraline  (ZOLOFT ) 100 MG tablet, Take 2 tablets (200 mg total) by mouth daily., Disp: 180 tablet, Rfl: 1   spironolactone  (ALDACTONE ) 25 MG tablet, Take 0.5 tablets (12.5 mg total) by mouth daily., Disp: 45 tablet, Rfl: 3   torsemide  (DEMADEX ) 20 MG tablet, Take 4 tablets (80 mg total) by mouth 2 (two) times daily as needed., Disp: 720 tablet, Rfl: 2    ursodiol  (ACTIGALL ) 300 MG capsule, Take 1 capsule (300 mg total) by mouth 2 (two) times daily. (Patient not taking: Reported on 08/02/2024), Disp: 60 capsule, Rfl: 5   Vitamin D , Ergocalciferol , (DRISDOL ) 1.25 MG (50000 UNIT) CAPS capsule, Take 1 capsule (50,000 Units total) by mouth every 7 (seven) days. (Patient not taking: Reported on 08/02/2024), Disp: 4 capsule, Rfl: 0   Objective:     Vitals:   08/05/24 1347  Pulse: 67  SpO2: 98%  Weight: (!) 322 lb (146.1 kg)  Height: 5' 2 (1.575 m)      Body mass index is 58.89 kg/m.    Physical Exam:    Gen: Appears well, nad, nontoxic and pleasant Psych: Alert and oriented, appropriate mood and affect Neuro: sensation intact, strength is 5/5 with df/pf/inv/ev, muscle tone wnl Skin: no susupicious lesions or rashes  Right ankle:  No deformity, no swelling or effusion NTTP over fibular head, lat mal, medial mal, achilles, navicular, base of 5th, ATFL, CFL, deltoid, calcaneous or midfoot ROM DF 30, PF 45, inv/ev intact Negative ant drawer, talar tilt, rotation test, squeeze test. Neg thompson No pain with resisted inversion or eversion    Electronically signed by:  Odis Mace Alexandra Beck Alexandra [facility] 2:52 PM 08/05/24

## 2024-08-02 NOTE — Telephone Encounter (Signed)
 PA submitted for Vraylar  1.5 mg, approved [employer] 08/05/24-08/06/27

## 2024-08-02 NOTE — Patient Instructions (Signed)
 Medication Instructions:  1)STOP losartan  for good (do not restart) 2)CHANGE metoprolol  to 50 mg succinate once a day 3) RESTART spironolactone  but at 12.5 mg daily dose to start. Watch for low blood pressure. We will recheck kidney function/potassium in 2 weeks to monitor response  See if you can get good fluid output with less dose of torsemide  (ex: try 100 mg, then 80 mg, etc)   Lab Work: BMET - in two weeks  Follow-Up: At [facility], you and your health needs are our priority.  As part of our continuing mission to provide you with exceptional heart care, our providers are all part of one team.  This team includes your primary Cardiologist (physician) and Advanced Practice Providers or APPs (Physician Assistants and Nurse Practitioners) who all work together to provide you with the care you need, when you need it.  Your next appointment:   6 months  Provider:   Shelda Bruckner, MD

## 2024-08-05 ENCOUNTER — Ambulatory Visit (INDEPENDENT_AMBULATORY_CARE_PROVIDER_SITE_OTHER)

## 2024-08-05 ENCOUNTER — Other Ambulatory Visit (HOSPITAL_COMMUNITY): Payer: Self-pay

## 2024-08-05 ENCOUNTER — Ambulatory Visit: Admitting: Sports Medicine

## 2024-08-05 ENCOUNTER — Inpatient Hospital Stay

## 2024-08-05 ENCOUNTER — Other Ambulatory Visit: Payer: Self-pay | Admitting: Adult Health

## 2024-08-05 VITALS — HR 67 | Ht 62.0 in | Wt 322.0 lb

## 2024-08-05 DIAGNOSIS — S96911S Strain of unspecified muscle and tendon at ankle and foot level, right foot, sequela: Secondary | ICD-10-CM

## 2024-08-05 DIAGNOSIS — M25571 Pain in right ankle and joints of right foot: Secondary | ICD-10-CM | POA: Diagnosis not present

## 2024-08-05 DIAGNOSIS — M79671 Pain in right foot: Secondary | ICD-10-CM | POA: Diagnosis not present

## 2024-08-05 DIAGNOSIS — G8929 Other chronic pain: Secondary | ICD-10-CM | POA: Diagnosis not present

## 2024-08-05 DIAGNOSIS — F331 Major depressive disorder, recurrent, moderate: Secondary | ICD-10-CM

## 2024-08-05 DIAGNOSIS — M51361 Other intervertebral disc degeneration, lumbar region with lower extremity pain only: Secondary | ICD-10-CM | POA: Diagnosis not present

## 2024-08-05 DIAGNOSIS — F428 Other obsessive-compulsive disorder: Secondary | ICD-10-CM

## 2024-08-05 NOTE — Patient Instructions (Addendum)
 Ankle HEP   PT referral   Epidural referral right L5-S1  Follow up 2 weeks after to discuss results

## 2024-08-06 ENCOUNTER — Other Ambulatory Visit (HOSPITAL_COMMUNITY): Payer: Self-pay

## 2024-08-06 ENCOUNTER — Other Ambulatory Visit: Payer: Self-pay

## 2024-08-06 ENCOUNTER — Ambulatory Visit: Payer: Self-pay | Admitting: Sports Medicine

## 2024-08-06 MED ORDER — SERTRALINE HCL 100 MG PO TABS
200.0000 mg | ORAL_TABLET | Freq: Every day | ORAL | 0 refills | Status: DC
  Filled 2024-08-06: qty 180, 90d supply, fill #0

## 2024-08-06 MED ORDER — LAMOTRIGINE 200 MG PO TABS
200.0000 mg | ORAL_TABLET | Freq: Every day | ORAL | 0 refills | Status: DC
  Filled 2024-08-06: qty 90, 90d supply, fill #0

## 2024-08-07 ENCOUNTER — Ambulatory Visit: Admitting: Psychology

## 2024-08-07 NOTE — Progress Notes (Unsigned)
                edge,  experiencing concentration difficulties, having trouble falling or staying asleep, exhibiting a general  state of irritability).: No Description Entered (Status: improved). Motor tension (e.g., restlessness,  tiredness, shakiness, muscle tension).: No Description Entered (Status: improved).  Problems Addressed  Anxiety, Phase Of Life Problems, Anxiety  Goals 1. Learn and implement coping skills that result in a reduction of anxiety  and worry, and improved daily functioning. Objective Learn  and implement calming skills to reduce overall anxiety and manage anxiety symptoms. Target Date: 2025-08-09Frequency: Weekly Progress: 40 Modality: individual  Related Interventions 1. Teach the client calming/relaxation skills (e.g., applied relaxation, progressive muscle  relaxation, cue controlled relaxation; mindful breathing; biofeedback) and how to discriminate  better between relaxation and tension; teach the client how to apply these skills to his/her daily  life (e.g., New Directions in Progressive Muscle Relaxation by Marcelyn Ditty, and  Hazlett-Stevens; Treating Generalized Anxiety Disorder by Rygh and Ida Rogue). Objective Identify, challenge, and replace biased, fearful self-talk with positive, realistic, and empowering selftalk. Target Date: 2024-07-06 Frequency: weekly Progress: 30 Modality: individual Related Interventions 1. Explore the client's schema and self-talk that mediate his/her fear response; assist him/her in  challenging the biases; replace the distorted messages with reality-based alternatives and  positive, realistic self-talk that will increase his/her self-confidence in coping with irrational  fears (see Cognitive Therapy of Anxiety Disorders by Laurence Slate). Objective Learn and implement problem-solving strategies for realistically addressing worries. Target Date: 2025-08-09Frequency: weekly Progress: 40 Modality: individual 2. Resolve conflicted feelings and adapt to the new life circumstances. Objective Apply problem-solving skills to current circumstances. Target Date: 2024-07-06 Frequency: weekly Progress: 20 Modality: individual Related Interventions 1. Teach the client problem-resolution skills (e.g., defining the problem clearly, brainstorming  multiple solutions, listing the pros and cons of each solution, seeking input from others,  selecting and implementing a plan of action, evaluating outcome, and readjusting plan as   necessary).   3. Stabilize anxiety level while increasing ability to function on a daily  basis. 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  The patient approved this plan.   Deonna Krummel G Ethridge Sollenberger, LCSW

## 2024-08-12 ENCOUNTER — Inpatient Hospital Stay

## 2024-08-14 ENCOUNTER — Ambulatory Visit (INDEPENDENT_AMBULATORY_CARE_PROVIDER_SITE_OTHER): Admitting: Psychology

## 2024-08-14 DIAGNOSIS — F428 Other obsessive-compulsive disorder: Secondary | ICD-10-CM

## 2024-08-14 DIAGNOSIS — F331 Major depressive disorder, recurrent, moderate: Secondary | ICD-10-CM | POA: Diagnosis not present

## 2024-08-14 DIAGNOSIS — F50819 Binge eating disorder, unspecified: Secondary | ICD-10-CM

## 2024-08-14 DIAGNOSIS — F4310 Post-traumatic stress disorder, unspecified: Secondary | ICD-10-CM

## 2024-08-14 NOTE — Progress Notes (Unsigned)
                edge,  experiencing concentration difficulties, having trouble falling or staying asleep, exhibiting a general  state of irritability).: No Description Entered (Status: improved). Motor tension (e.g., restlessness,  tiredness, shakiness, muscle tension).: No Description Entered (Status: improved).  Problems Addressed  Anxiety, Phase Of Life Problems, Anxiety  Goals 1. Learn and implement coping skills that result in a reduction of anxiety  and worry, and improved daily functioning. Objective Learn  and implement calming skills to reduce overall anxiety and manage anxiety symptoms. Target Date: 2025-08-09Frequency: Weekly Progress: 40 Modality: individual  Related Interventions 1. Teach the client calming/relaxation skills (e.g., applied relaxation, progressive muscle  relaxation, cue controlled relaxation; mindful breathing; biofeedback) and how to discriminate  better between relaxation and tension; teach the client how to apply these skills to his/her daily  life (e.g., New Directions in Progressive Muscle Relaxation by Marcelyn Ditty, and  Hazlett-Stevens; Treating Generalized Anxiety Disorder by Rygh and Ida Rogue). Objective Identify, challenge, and replace biased, fearful self-talk with positive, realistic, and empowering selftalk. Target Date: 2024-07-06 Frequency: weekly Progress: 30 Modality: individual Related Interventions 1. Explore the client's schema and self-talk that mediate his/her fear response; assist him/her in  challenging the biases; replace the distorted messages with reality-based alternatives and  positive, realistic self-talk that will increase his/her self-confidence in coping with irrational  fears (see Cognitive Therapy of Anxiety Disorders by Laurence Slate). Objective Learn and implement problem-solving strategies for realistically addressing worries. Target Date: 2025-08-09Frequency: weekly Progress: 40 Modality: individual 2. Resolve conflicted feelings and adapt to the new life circumstances. Objective Apply problem-solving skills to current circumstances. Target Date: 2024-07-06 Frequency: weekly Progress: 20 Modality: individual Related Interventions 1. Teach the client problem-resolution skills (e.g., defining the problem clearly, brainstorming  multiple solutions, listing the pros and cons of each solution, seeking input from others,  selecting and implementing a plan of action, evaluating outcome, and readjusting plan as   necessary).   3. Stabilize anxiety level while increasing ability to function on a daily  basis. 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  The patient approved this plan.   Deonna Krummel G Ethridge Sollenberger, LCSW

## 2024-08-19 ENCOUNTER — Telehealth (HOSPITAL_COMMUNITY): Payer: Self-pay

## 2024-08-19 ENCOUNTER — Other Ambulatory Visit (HOSPITAL_COMMUNITY): Payer: Self-pay | Admitting: Hematology and Oncology

## 2024-08-19 ENCOUNTER — Inpatient Hospital Stay

## 2024-08-19 ENCOUNTER — Ambulatory Visit

## 2024-08-19 ENCOUNTER — Encounter (INDEPENDENT_AMBULATORY_CARE_PROVIDER_SITE_OTHER): Payer: Self-pay | Admitting: Family Medicine

## 2024-08-19 ENCOUNTER — Ambulatory Visit (INDEPENDENT_AMBULATORY_CARE_PROVIDER_SITE_OTHER): Admitting: Family Medicine

## 2024-08-19 DIAGNOSIS — D509 Iron deficiency anemia, unspecified: Secondary | ICD-10-CM | POA: Insufficient documentation

## 2024-08-19 NOTE — Telephone Encounter (Signed)
 Auth Submission: NO AUTH NEEDED Site of care: Site of care: [facility] Payer: [employer] Medication & CPT/J Code(s) submitted: Venofer  (Iron  Sucrose) J1756 Diagnosis Code: D50.9 Route of submission (phone, fax, portal):  Phone # Fax # Auth type: Buy/Bill PB Units/visits requested: 300mg  x 3 doses Reference number:  Approval from: 08/19/24 to 11/18/24

## 2024-08-20 ENCOUNTER — Encounter (HOSPITAL_COMMUNITY): Payer: Self-pay

## 2024-08-20 ENCOUNTER — Emergency Department (HOSPITAL_COMMUNITY)
Admission: EM | Admit: 2024-08-20 | Discharge: 2024-08-20 | Discharge status: Against Medical Advice | Attending: Emergency Medicine | Admitting: Emergency Medicine

## 2024-08-20 ENCOUNTER — Other Ambulatory Visit: Payer: Self-pay

## 2024-08-20 ENCOUNTER — Emergency Department (HOSPITAL_COMMUNITY)

## 2024-08-20 DIAGNOSIS — Z5321 Procedure and treatment not carried out due to patient leaving prior to being seen by health care provider: Secondary | ICD-10-CM | POA: Diagnosis not present

## 2024-08-20 DIAGNOSIS — R1084 Generalized abdominal pain: Secondary | ICD-10-CM | POA: Insufficient documentation

## 2024-08-20 DIAGNOSIS — R110 Nausea: Secondary | ICD-10-CM | POA: Insufficient documentation

## 2024-08-20 DIAGNOSIS — R0602 Shortness of breath: Secondary | ICD-10-CM | POA: Diagnosis not present

## 2024-08-20 DIAGNOSIS — R197 Diarrhea, unspecified: Secondary | ICD-10-CM | POA: Diagnosis not present

## 2024-08-20 LAB — URINALYSIS, ROUTINE W REFLEX MICROSCOPIC
Bilirubin Urine: NEGATIVE
Glucose, UA: NEGATIVE mg/dL
Ketones, ur: NEGATIVE mg/dL
Nitrite: NEGATIVE
Protein, ur: NEGATIVE mg/dL
Specific Gravity, Urine: 1.005 (ref 1.005–1.030)
WBC, UA: >50 WBC/hpf (ref 0–5)
pH: 6 (ref 5.0–8.0)

## 2024-08-20 LAB — LIPASE, BLOOD: Lipase: 26 U/L (ref 11–51)

## 2024-08-20 LAB — CBC WITH DIFFERENTIAL/PLATELET
Abs Immature Granulocytes: 0.05 K/uL (ref 0.00–0.07)
Basophils Absolute: 0 K/uL (ref 0.0–0.1)
Basophils Relative: 0 %
Eosinophils Absolute: 0.1 K/uL (ref 0.0–0.5)
Eosinophils Relative: 1 %
HCT: 31.2 % — ABNORMAL LOW (ref 36.0–46.0)
Hemoglobin: 9.2 g/dL — ABNORMAL LOW (ref 12.0–15.0)
Immature Granulocytes: 0 %
Lymphocytes Relative: 6 %
Lymphs Abs: 0.7 K/uL (ref 0.7–4.0)
MCH: 24.1 pg — ABNORMAL LOW (ref 26.0–34.0)
MCHC: 29.5 g/dL — ABNORMAL LOW (ref 30.0–36.0)
MCV: 81.7 fL (ref 80.0–100.0)
Monocytes Absolute: 0.7 K/uL (ref 0.1–1.0)
Monocytes Relative: 6 %
Neutro Abs: 10.2 K/uL — ABNORMAL HIGH (ref 1.7–7.7)
Neutrophils Relative %: 87 %
Platelets: 324 K/uL (ref 150–400)
RBC: 3.82 MIL/uL — ABNORMAL LOW (ref 3.87–5.11)
RDW: 18.1 % — ABNORMAL HIGH (ref 11.5–15.5)
WBC: 11.8 K/uL — ABNORMAL HIGH (ref 4.0–10.5)
nRBC: 0 % (ref 0.0–0.2)

## 2024-08-20 LAB — COMPREHENSIVE METABOLIC PANEL WITH GFR
ALT: 13 U/L (ref 0–44)
AST: 18 U/L (ref 15–41)
Albumin: 3.4 g/dL — ABNORMAL LOW (ref 3.5–5.0)
Alkaline Phosphatase: 89 U/L (ref 38–126)
Anion gap: 12 (ref 5–15)
BUN: 23 mg/dL — ABNORMAL HIGH (ref 6–20)
CO2: 28 mmol/L (ref 22–32)
Calcium: 8.8 mg/dL — ABNORMAL LOW (ref 8.9–10.3)
Chloride: 100 mmol/L (ref 98–111)
Creatinine, Ser: 1.2 mg/dL — ABNORMAL HIGH (ref 0.44–1.00)
GFR, Estimated: 57 mL/min — ABNORMAL LOW (ref >60)
Glucose, Bld: 115 mg/dL — ABNORMAL HIGH (ref 70–99)
Potassium: 3.8 mmol/L (ref 3.5–5.1)
Sodium: 140 mmol/L (ref 135–145)
Total Bilirubin: 0.6 mg/dL (ref 0.0–1.2)
Total Protein: 7.4 g/dL (ref 6.5–8.1)

## 2024-08-20 LAB — PREGNANCY, URINE: Preg Test, Ur: NEGATIVE

## 2024-08-20 NOTE — ED Notes (Signed)
 PT left d/t wait time

## 2024-08-20 NOTE — ED Provider Triage Note (Signed)
 Emergency Medicine Provider Triage Evaluation Note  Alexandra Beck , a 43 y.o. female  was evaluated in triage.  Pt complains of right-sided abdominal pain starting about 2 hours ago.  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  She has not had pain like this prior.  No vomiting.  She had diarrhea around the time the pain started.  No urinary symptoms.  Also reports some shortness of breath without chest pain.  Review of Systems  Positive: Abdominal pain, shortness of breath Negative: Fever  Physical Exam  BP (!) 143/48   Pulse 65   Temp 99.2 F (37.3 C)   Resp 20   Ht 5' 2.5 (1.588 m)   Wt (!) 140.6 kg   LMP 08/02/2024   SpO2 95%   BMI 55.80 kg/m  Gen:   Awake, no distress   Resp:  Normal effort  MSK:   Moves extremities without difficulty , right-sided abdominal tenderness Other:    Medical Decision Making  Medically screening exam initiated at 3:32 PM.  Appropriate orders placed.  Alexandra Beck was informed that the remainder of the evaluation will be completed by another provider, this initial triage assessment does not replace that evaluation, and the importance of remaining in the ED until their evaluation is complete.     Alexandra Chew, PA-C 08/20/24 1533

## 2024-08-20 NOTE — ED Triage Notes (Signed)
 Patient arrives in wheelchair by POV c/o generalized abdominal pain and shortness of breath onset of 2 hours ago. Patient reports nausea and diarrhea.

## 2024-08-20 NOTE — ED Triage Notes (Signed)
 See sort nurse note

## 2024-08-21 ENCOUNTER — Ambulatory Visit (INDEPENDENT_AMBULATORY_CARE_PROVIDER_SITE_OTHER): Admitting: Psychology

## 2024-08-21 DIAGNOSIS — F428 Other obsessive-compulsive disorder: Secondary | ICD-10-CM

## 2024-08-21 DIAGNOSIS — F50819 Binge eating disorder, unspecified: Secondary | ICD-10-CM

## 2024-08-21 DIAGNOSIS — F331 Major depressive disorder, recurrent, moderate: Secondary | ICD-10-CM

## 2024-08-21 DIAGNOSIS — F4310 Post-traumatic stress disorder, unspecified: Secondary | ICD-10-CM

## 2024-08-21 DIAGNOSIS — F422 Mixed obsessional thoughts and acts: Secondary | ICD-10-CM

## 2024-08-21 DIAGNOSIS — D509 Iron deficiency anemia, unspecified: Secondary | ICD-10-CM
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
## 2024-08-21 NOTE — Telephone Encounter (Signed)
 FYI

## 2024-08-23 ENCOUNTER — Ambulatory Visit: Admitting: Internal Medicine

## 2024-08-24 ENCOUNTER — Other Ambulatory Visit: Payer: Self-pay | Admitting: Primary Care

## 2024-08-26 ENCOUNTER — Other Ambulatory Visit (HOSPITAL_COMMUNITY): Payer: Self-pay

## 2024-08-26 ENCOUNTER — Ambulatory Visit (INDEPENDENT_AMBULATORY_CARE_PROVIDER_SITE_OTHER)

## 2024-08-26 ENCOUNTER — Other Ambulatory Visit: Payer: Self-pay

## 2024-08-26 VITALS — BP 125/85 | HR 60 | Temp 99.1°F | Resp 16 | Ht 62.5 in | Wt 314.2 lb

## 2024-08-26 DIAGNOSIS — D509 Iron deficiency anemia, unspecified: Secondary | ICD-10-CM

## 2024-08-26 DIAGNOSIS — D649 Anemia, unspecified: Secondary | ICD-10-CM

## 2024-08-26 MED ORDER — SODIUM CHLORIDE 0.9 % IV SOLN
300.0000 mg | Freq: Once | INTRAVENOUS | Status: AC
  Administered 2024-08-26: 300 mg via INTRAVENOUS
  Filled 2024-08-26: qty 15

## 2024-08-26 MED ORDER — MODAFINIL 100 MG PO TABS
100.0000 mg | ORAL_TABLET | Freq: Every day | ORAL | 3 refills | Status: AC
  Filled 2024-08-26: qty 30, 30d supply, fill #0
  Filled 2024-09-12 – 2024-10-02 (×2): qty 30, 30d supply, fill #1
  Filled 2024-10-15 – 2024-10-30 (×3): qty 30, 30d supply, fill #2

## 2024-08-26 NOTE — Telephone Encounter (Signed)
 Pt requesting refill of controlled medication - please advise, thank you! LOV: 02/27/24

## 2024-08-26 NOTE — Progress Notes (Signed)
 Diagnosis: Iron  Deficiency Anemia  Provider:  Praveen Mannam MD  Procedure: IV Infusion  IV Type: Peripheral, IV Location: L Antecubital  Venofer  (Iron  Sucrose), Dose: 300 mg  Infusion Start Time: 1325  Infusion Stop Time: 1508  Post Infusion IV Care: Observation period completed and Peripheral IV Discontinued  Discharge: Condition: Good, Destination: Home . AVS Declined  Performed by:  Leita FORBES Miles, LPN

## 2024-08-27 ENCOUNTER — Other Ambulatory Visit: Payer: Self-pay

## 2024-08-28 ENCOUNTER — Ambulatory Visit (INDEPENDENT_AMBULATORY_CARE_PROVIDER_SITE_OTHER): Admitting: Psychology

## 2024-08-28 DIAGNOSIS — F4310 Post-traumatic stress disorder, unspecified: Secondary | ICD-10-CM | POA: Diagnosis not present

## 2024-08-28 DIAGNOSIS — F50819 Binge eating disorder, unspecified: Secondary | ICD-10-CM

## 2024-08-28 DIAGNOSIS — F428 Other obsessive-compulsive disorder: Secondary | ICD-10-CM

## 2024-08-28 DIAGNOSIS — F422 Mixed obsessional thoughts and acts: Secondary | ICD-10-CM

## 2024-08-28 DIAGNOSIS — F331 Major depressive disorder, recurrent, moderate: Secondary | ICD-10-CM | POA: Diagnosis not present
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
## 2024-08-31 ENCOUNTER — Encounter: Payer: Self-pay | Admitting: Internal Medicine

## 2024-09-02 ENCOUNTER — Ambulatory Visit

## 2024-09-02 VITALS — BP 103/67 | HR 59 | Temp 98.8°F | Resp 16 | Ht 62.5 in | Wt 313.6 lb

## 2024-09-02 DIAGNOSIS — D649 Anemia, unspecified: Secondary | ICD-10-CM

## 2024-09-02 DIAGNOSIS — D509 Iron deficiency anemia, unspecified: Secondary | ICD-10-CM

## 2024-09-02 MED ORDER — SODIUM CHLORIDE 0.9 % IV SOLN
300.0000 mg | Freq: Once | INTRAVENOUS | Status: AC
  Administered 2024-09-02: 300 mg via INTRAVENOUS
  Filled 2024-09-02: qty 15

## 2024-09-02 NOTE — Progress Notes (Signed)
 Diagnosis: Iron  Deficiency Anemia  Provider:  Mannam, Praveen MD  Procedure: IV Infusion  IV Type: Peripheral, IV Location: L Forearm  Venofer  (Iron  Sucrose), Dose: 300 mg  Infusion Start Time: 1332  Infusion Stop Time: 1515  Post Infusion IV Care: Patient declined observation and Peripheral IV Discontinued  Discharge: Condition: Good, Destination: Home . AVS Declined  Performed by:  Rocky FORBES Sar, RN

## 2024-09-03 NOTE — Therapy (Incomplete)
 OUTPATIENT PHYSICAL THERAPY THORACOLUMBAR EVALUATION   Patient Name: Alexandra Beck MRN: [id number] DOB:1981-07-25, 43 y.o., female Today's Date: 09/03/2024  END OF SESSION:   Past Medical History:  Diagnosis Date   Acute renal failure (ARF) 11/2012    multifactorial-likely secondary to ATN in the setting of sepsis and hypotension, and also from rhabdomyolysis   Anemia    Cellulitis    Chronic diastolic CHF (congestive heart failure) (HCC)    Depression    Diabetes mellitus (HCC)    GERD (gastroesophageal reflux disease)    Hyperlipidemia    Hypertension    Hypothyroidism    Morbid obesity with BMI of 70 and over, adult (HCC)    Obesity hypoventilation syndrome (HCC)    OSA (obstructive sleep apnea)    PVC's (premature ventricular contractions)    Recurrent cellulitis of lower leg    LLE, venous insuff   Sepsis (HCC) 11/2012   Secondary to cellulitis   Sleep apnea    Venous insufficiency of leg    Past Surgical History:  Procedure Laterality Date   GASTRIC BYPASS  06/12/2024   IR FLUORO GUIDE CV MIDLINE PICC RIGHT  03/28/2017   IR US  GUIDE VASC ACCESS RIGHT  03/28/2017   WISDOM TOOTH EXTRACTION     Patient Active Problem List   Diagnosis Date Noted   Iron  deficiency anemia, unspecified 08/19/2024   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   Yeast vaginitis 02/16/2024   Severe sepsis (HCC) 02/07/2024   UTI (urinary tract infection) 02/07/2024   IDA (iron  deficiency anemia) 11/13/2023   Diverticulitis of large intestine with abscess 09/21/2023   Diarrhea 09/24/2022   Abdominal pain 09/23/2022   Encounter for well adult exam with abnormal findings 06/17/2022   Bilateral hearing loss 06/17/2022   Anemia 06/17/2022   Well woman exam 06/17/2022   Fever 05/14/2022   Hypokalemia 05/14/2022   Lightheadedness 05/14/2022   Shortness of breath 05/14/2022   Pneumonia 05/13/2022   Eczema, dyshidrotic 09/19/2021   Chronic pain 09/19/2021   Cellulitis and abscess of neck 05/14/2021    Diabetes mellitus (HCC) 10/19/2020   Psoriasis 12/26/2019   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   Hypersomnolence 05/11/2018   Vitamin D  deficiency 05/02/2018   Cellulitis of left leg 04/17/2017   Cellulitis of right leg 01/11/2017   PVC's (premature ventricular contractions) 12/02/2016   Chronic diastolic CHF (congestive heart failure) (HCC)    AKI (acute kidney injury)    Hyperlipidemia associated with type 2 diabetes mellitus (HCC)    Type 2 diabetes mellitus with obesity    Hypothyroidism    GERD (gastroesophageal reflux disease)    Depression    Venous insufficiency of leg    Recurrent cellulitis of lower leg 12/25/2012   OSA (obstructive sleep apnea) 12/25/2012   Obesity hypoventilation syndrome (HCC) 12/25/2012   Morbid obesity (HCC) 12/24/2012   Hypertension associated with diabetes (HCC) 06/26/2007    PCP: Norleen Lynwood ORN, MD  REFERRING PROVIDER:    Leonce Katz, DO    REFERRING DIAG:  [phone] (ICD-10-CM) - Strain of tendon of right foot and ankle, sequela  M25.571,G89.29 (ICD-10-CM) - Chronic pain of right ankle  M79.671 (ICD-10-CM) - Right foot pain  M51.361 (ICD-10-CM) - Degeneration of intervertebral disc of lumbar region with lower extremity pain    Rationale for Evaluation and Treatment: Rehabilitation  THERAPY DIAG:  No diagnosis found.  ONSET DATE: ***  SUBJECTIVE:  SUBJECTIVE STATEMENT: ***  PERTINENT HISTORY:  ***  PAIN:  Are you having pain? Yes: NPRS scale: *** Pain location: *** Pain description: *** Aggravating factors: *** Relieving factors: ***  PRECAUTIONS: {Therapy precautions:24002}  RED FLAGS: {PT Red Flags:29287}   WEIGHT BEARING RESTRICTIONS: {Yes ***/No:24003}  FALLS:  Has patient  fallen in last 6 months? {fallsyesno:27318}  LIVING ENVIRONMENT: Lives with: {[facility] lives with:25569::lives with their family} Lives in: {Lives in:25570} Stairs: {opstairs:27293} Has following equipment at home: {Assistive devices:23999}  OCCUPATION: ***  PLOF: {PLOF:24004}  PATIENT GOALS: ***  NEXT MD VISIT: ***  OBJECTIVE:  Note: Objective measures were completed at Evaluation unless otherwise noted.  DIAGNOSTIC FINDINGS:  08/05/24 R ankle IMPRESSION: 1. No acute fracture or dislocation. 2. Extensive subcutaneous calcification deposition throughout the calf. R foot IMPRESSION: 1. No acute fracture or dislocation. 2. Extensive subcutaneous calcification deposition throughout the calf.  02/13/22 MR Lumbar IMPRESSION: 1. Disc and endplate degeneration at L5-S1 with a complex paracentral disc herniation. Cannot exclude an extruded disc fragment. Superimposed moderate facet hypertrophy. Subsequent moderate to severe spinal, severe right lateral recess, and moderate to severe bilateral foraminal stenosis. Query right L5 and/or S1 radiculitis.   2. Minimal disc degeneration at L4-L5. Multilevel mild facet hypertrophy.  PATIENT SURVEYS:  {rehab surveys:24030}  COGNITION: Overall cognitive status: {cognition:24006}     SENSATION: {sensation:27233}  MUSCLE LENGTH: Hamstrings: Right *** deg; Left *** deg Debby test: Right *** deg; Left *** deg  POSTURE: {posture:25561}  PALPATION: ***  LUMBAR ROM:   AROM eval  Flexion   Extension   Right lateral flexion   Left lateral flexion   Right rotation   Left rotation    (Blank rows = not tested)  LOWER EXTREMITY ROM:     {AROM/PROM:27142}  Right eval Left eval  Hip flexion    Hip extension    Hip abduction    Hip adduction    Hip internal rotation    Hip external rotation    Knee flexion    Knee extension    Ankle dorsiflexion    Ankle plantarflexion    Ankle inversion    Ankle eversion      (Blank rows = not tested)  LOWER EXTREMITY MMT:    MMT Right eval Left eval  Hip flexion    Hip extension    Hip abduction    Hip adduction    Hip internal rotation    Hip external rotation    Knee flexion    Knee extension    Ankle dorsiflexion    Ankle plantarflexion    Ankle inversion    Ankle eversion     (Blank rows = not tested)  LUMBAR SPECIAL TESTS:  {lumbar special test:25242}  FUNCTIONAL TESTS:  {Functional tests:24029}  GAIT: Distance walked: *** Assistive device utilized: {Assistive devices:23999} Level of assistance: {Levels of assistance:24026} Comments: ***  TREATMENT DATE:  [facility] Adult PT Treatment:                                                DATE: 09/04/24 Therapeutic Exercise: *** Manual Therapy: *** Neuromuscular re-ed: *** Therapeutic Activity: *** Modalities: *** Self Care: ***  PATIENT EDUCATION:  Education details: *** Person educated: {Person educated:25204} Education method: {Education Method:25205} Education comprehension: {Education Comprehension:25206}  HOME EXERCISE PROGRAM: ***  ASSESSMENT:  CLINICAL IMPRESSION: Patient is a 43 y.o. female who was seen today for physical therapy evaluation and treatment for  S96.911S (ICD-10-CM) - Strain of tendon of right foot and ankle, sequela  M25.571,G89.29 (ICD-10-CM) - Chronic pain of right ankle  M79.671 (ICD-10-CM) - Right foot pain  M51.361 (ICD-10-CM) - Degeneration of intervertebral disc of lumbar region with lower extremity pain  .   OBJECTIVE IMPAIRMENTS: {opptimpairments:25111}.   ACTIVITY LIMITATIONS: {activitylimitations:27494}  PARTICIPATION LIMITATIONS: {participationrestrictions:25113}  PERSONAL FACTORS: {Personal factors:25162} are also affecting patient's functional outcome.   REHAB POTENTIAL: {rehabpotential:25112}  CLINICAL  DECISION MAKING: {clinical decision making:25114}  EVALUATION COMPLEXITY: {Evaluation complexity:25115}   GOALS:  SHORT TERM GOALS: Target date: ***  *** Baseline: Goal status: INITIAL  2.  *** Baseline:  Goal status: INITIAL  3.  *** Baseline:  Goal status: INITIAL  4.  *** Baseline:  Goal status: INITIAL  5.  *** Baseline:  Goal status: INITIAL  6.  *** Baseline:  Goal status: INITIAL  LONG TERM GOALS: Target date: ***  *** Baseline:  Goal status: INITIAL  2.  *** Baseline:  Goal status: INITIAL  3.  *** Baseline:  Goal status: INITIAL  4.  *** Baseline:  Goal status: INITIAL  5.  *** Baseline:  Goal status: INITIAL  6.  *** Baseline:  Goal status: INITIAL  PLAN:  PT FREQUENCY: {rehab frequency:25116}  PT DURATION: {rehab duration:25117}  PLANNED INTERVENTIONS: {rehab planned interventions:25118::97110-Therapeutic exercises,97530- Therapeutic [phone]- Neuromuscular re-education,97535- Self Rjmz,02859- Manual therapy,Patient/Family education}.  PLAN FOR NEXT SESSION: ***   Dasie Daft, PT 09/03/2024, 7:47 AM

## 2024-09-03 NOTE — Telephone Encounter (Signed)
 I agree, this should be addressed by the bariatric provider as this is very highly likely related.

## 2024-09-04 ENCOUNTER — Ambulatory Visit

## 2024-09-04 ENCOUNTER — Ambulatory Visit: Admitting: Psychology

## 2024-09-09 ENCOUNTER — Ambulatory Visit

## 2024-09-09 VITALS — BP 116/78 | HR 68 | Temp 98.1°F | Resp 18 | Ht 62.5 in | Wt 312.4 lb

## 2024-09-09 DIAGNOSIS — D509 Iron deficiency anemia, unspecified: Secondary | ICD-10-CM | POA: Diagnosis not present

## 2024-09-09 DIAGNOSIS — D649 Anemia, unspecified: Secondary | ICD-10-CM

## 2024-09-09 MED ORDER — SODIUM CHLORIDE 0.9 % IV SOLN
300.0000 mg | Freq: Once | INTRAVENOUS | Status: AC
  Administered 2024-09-09: 300 mg via INTRAVENOUS
  Filled 2024-09-09: qty 15

## 2024-09-09 NOTE — Progress Notes (Signed)
 Diagnosis: Iron  Deficiency Anemia  Provider:  Mannam, Praveen MD  Procedure: IV Infusion  IV Type: Peripheral, IV Location: L Antecubital  Venofer  (Iron  Sucrose), Dose: 300 mg  Infusion Start Time: 1224  Infusion Stop Time: 1405  Post Infusion IV Care: Observation period completed and Peripheral IV Discontinued  Discharge: Condition: Good, Destination: Home . AVS Declined  Performed by:  Rocky FORBES Sar, RN

## 2024-09-11 ENCOUNTER — Ambulatory Visit (INDEPENDENT_AMBULATORY_CARE_PROVIDER_SITE_OTHER): Admitting: Psychology

## 2024-09-11 DIAGNOSIS — F4310 Post-traumatic stress disorder, unspecified: Secondary | ICD-10-CM | POA: Diagnosis not present

## 2024-09-11 DIAGNOSIS — F428 Other obsessive-compulsive disorder: Secondary | ICD-10-CM | POA: Diagnosis not present

## 2024-09-11 DIAGNOSIS — F50819 Binge eating disorder, unspecified: Secondary | ICD-10-CM | POA: Diagnosis not present

## 2024-09-11 DIAGNOSIS — F331 Major depressive disorder, recurrent, moderate: Secondary | ICD-10-CM

## 2024-09-12 ENCOUNTER — Other Ambulatory Visit: Payer: Self-pay

## 2024-09-12 ENCOUNTER — Other Ambulatory Visit (HOSPITAL_COMMUNITY): Payer: Self-pay

## 2024-09-12 NOTE — Progress Notes (Signed)
 [facility] Counselor/Therapist Progress Note  Patient ID: Alexandra Beck, MRN: [id number],    Date: 09/11/2024  Time spent: 60 minutes  Time In:  1:00 Time out:2:00  Treatment Type: Individual Therapy  Reported Symptoms: sadness,   Mental Status Exam: Appearance:  Casual     Behavior: Appropriate  Motor: Normal  Speech/Language:  Normal Rate  Affect: blunted  Mood: sad  Thought process: normal  Thought content:   WNL  Sensory/Perceptual disturbances:   WNL  Orientation: oriented to person, place, time/date, and situation  Attention: Good  Concentration: Good  Memory: WNL  Fund of knowledge:  Good  Insight:   Good  Judgment:  Good  Impulse Control: Good   Risk Assessment: Danger to Self:  No Self-injurious Behavior: No Danger to Others: No Duty to Warn:no Physical Aggression / Violence:No  Access to Firearms a concern: No  Gang Involvement:No   Subjective: The patient attended a face-to-face individual therapy session in the office today.  The patient presents as sad today.  The patient came to the office for the first time today and she brought up what we had talked about a few sessions ago about the possibility that she could be having problems with dumping related to her gastric bypass surgery.  The patient reports that she felt sad and at her feelings that I thought that is possible that she could be doing something wrong.  I explained to her that my intention was not for her to be upset with me about what I was talking about but that she probably needed to whether that was an actual thing that could be happening or not.  I had tried to explain to her that sometimes when we emotionally eat we do it without really being where that were doing it and we talked about the need for her to be mindful about what it is that she is eating and what she is doing to her body.  We also talked about her having a food addiction and I explained to her that a lot of people  have food addictions and that she just has to be more aware of those things because those things can affect her progress and her mood.  I explained to her that I wanted to make sure that we are paying attention to those things because the emotional work that she is going to do is going to be difficult and she is going to want to self-medicate with food.  She seemed okay when she left but she did say that it would be difficult for her to come in on a regular basis because of her job schedule.  I will see if she is going to be willing to work through with the EMDR her trauma from her childhood.  Interventions: Cognitive Behavioral Therapy, Assertiveness/Communication, and Problem solving, psycho education  Diagnosis:Major depressive disorder, recurrent episode, moderate (HCC)  Obsessional thoughts  PTSD (post-traumatic stress disorder)  Binge eating disorder, unspecified severity  Plan: Treatment Plan  Strengths/Abilities:  Intelligent, ability for insight, supportive wife  Treatment Preferences:  Outpatient Individual therapy  Statement of Needs:  I Need help with my depression and motivation.   Symptoms:Depressed or irritable mood.:(Status: improved). Diminished interest in or  enjoyment of activities.(Status: improved). Feelings of hopelessness,  worthlessness, or inappropriate guilt.: (Status: improved). History of chronic or recurrent depression for which the client has taken antidepressant medication, been hospitalized, had  outpatient treatment, or had a course of electroconvulsive therapy.(Status:  maintained). Lack  of energy.: (Status: maintained). Low self-esteem.:  (Status: improved). Poor concentration and indecisiveness.:  (Status: improved). Social withdrawal.:  (Status: maintained).  Unresolved grief issues.: (Status: maintained).  Problems Addressed:  Unipolar Depression  Goals:  LTG:1. Alleviate depressive symptoms and return to previous level of effective   functioning.  70% 2. Appropriately grieve the loss in order to normalize mood and to return  to previously adaptive level of functioning. 3. Develop healthy interpersonal relationships that lead to the alleviation  and help prevent the relapse of depression.  70% 4. Develop healthy thinking patterns and beliefs about self, others, and the world that lead to the alleviation and help prevent the relapse of  Depression  70% STG:1.Describe current and past experiences with depression including their impact on functioning and  attempts to resolve it.  70 % 2. Identify and replace thoughts and beliefs that support depression.  70 % 3.Learn and implement behavioral strategies to overcome depression. 70% 4.Verbalize an understanding of healthy and unhealthy emotions with the intent of increasing the use of  healthy emotions to guide actions.  50 %  Target Date:  10/07/2025 Frequency: Bi Weekly Modality:Individual Interventions by Therapist:  CBT, problem solving, EMDR, insight oriented  Patient approved Treatment Plan  Alexandra Beck [name], LCSW

## 2024-09-13 ENCOUNTER — Other Ambulatory Visit: Payer: Self-pay | Admitting: *Deleted

## 2024-09-13 ENCOUNTER — Encounter: Payer: Self-pay | Admitting: Internal Medicine

## 2024-09-13 ENCOUNTER — Ambulatory Visit: Payer: Self-pay | Admitting: Internal Medicine

## 2024-09-13 ENCOUNTER — Ambulatory Visit: Admitting: Internal Medicine

## 2024-09-13 VITALS — BP 132/82 | HR 58 | Temp 98.1°F | Ht 62.5 in | Wt 308.0 lb

## 2024-09-13 DIAGNOSIS — A09 Infectious gastroenteritis and colitis, unspecified: Secondary | ICD-10-CM

## 2024-09-13 DIAGNOSIS — E1159 Type 2 diabetes mellitus with other circulatory complications: Secondary | ICD-10-CM | POA: Diagnosis not present

## 2024-09-13 DIAGNOSIS — E559 Vitamin D deficiency, unspecified: Secondary | ICD-10-CM | POA: Diagnosis not present

## 2024-09-13 DIAGNOSIS — D509 Iron deficiency anemia, unspecified: Secondary | ICD-10-CM

## 2024-09-13 DIAGNOSIS — Z23 Encounter for immunization: Secondary | ICD-10-CM | POA: Diagnosis not present

## 2024-09-13 DIAGNOSIS — E1169 Type 2 diabetes mellitus with other specified complication: Secondary | ICD-10-CM

## 2024-09-13 DIAGNOSIS — E038 Other specified hypothyroidism: Secondary | ICD-10-CM

## 2024-09-13 DIAGNOSIS — N1831 Chronic kidney disease, stage 3a: Secondary | ICD-10-CM

## 2024-09-13 DIAGNOSIS — Z1231 Encounter for screening mammogram for malignant neoplasm of breast: Secondary | ICD-10-CM

## 2024-09-13 DIAGNOSIS — E1122 Type 2 diabetes mellitus with diabetic chronic kidney disease: Secondary | ICD-10-CM

## 2024-09-13 DIAGNOSIS — I152 Hypertension secondary to endocrine disorders: Secondary | ICD-10-CM

## 2024-09-13 DIAGNOSIS — F3289 Other specified depressive episodes: Secondary | ICD-10-CM

## 2024-09-13 DIAGNOSIS — E785 Hyperlipidemia, unspecified: Secondary | ICD-10-CM

## 2024-09-13 LAB — BASIC METABOLIC PANEL WITH GFR
BUN: 19 mg/dL (ref 6–23)
CO2: 32 meq/L (ref 19–32)
Calcium: 9.1 mg/dL (ref 8.4–10.5)
Chloride: 101 meq/L (ref 96–112)
Creatinine, Ser: 0.99 mg/dL (ref 0.40–1.20)
GFR: 69.46 mL/min (ref >60.00)
Glucose, Bld: 76 mg/dL (ref 70–99)
Potassium: 3.7 meq/L (ref 3.5–5.1)
Sodium: 142 meq/L (ref 135–145)

## 2024-09-13 LAB — CBC WITH DIFFERENTIAL/PLATELET
Basophils Absolute: 0 K/uL (ref 0.0–0.1)
Basophils Relative: 0.4 % (ref 0.0–3.0)
Eosinophils Absolute: 0.1 K/uL (ref 0.0–0.7)
Eosinophils Relative: 1.9 % (ref 0.0–5.0)
HCT: 30 % — ABNORMAL LOW (ref 36.0–46.0)
Hemoglobin: 9.3 g/dL — ABNORMAL LOW (ref 12.0–15.0)
Lymphocytes Relative: 18.3 % (ref 12.0–46.0)
Lymphs Abs: 1 K/uL (ref 0.7–4.0)
MCHC: 31 g/dL (ref 30.0–36.0)
MCV: 79.3 fl (ref 78.0–100.0)
Monocytes Absolute: 0.5 K/uL (ref 0.1–1.0)
Monocytes Relative: 8.5 % (ref 3.0–12.0)
Neutro Abs: 4 K/uL (ref 1.4–7.7)
Neutrophils Relative %: 70.9 % (ref 43.0–77.0)
Platelets: 269 K/uL (ref 150.0–400.0)
RBC: 3.78 Mil/uL — ABNORMAL LOW (ref 3.87–5.11)
RDW: 23 % — ABNORMAL HIGH (ref 11.5–15.5)
WBC: 5.6 K/uL (ref 4.0–10.5)

## 2024-09-13 LAB — LIPID PANEL
Cholesterol: 158 mg/dL (ref 0–200)
HDL: 54.8 mg/dL (ref >39.00)
LDL Cholesterol: 77 mg/dL (ref 0–99)
NonHDL: 102.91
Total CHOL/HDL Ratio: 3
Triglycerides: 128 mg/dL (ref 0.0–149.0)
VLDL: 25.6 mg/dL (ref 0.0–40.0)

## 2024-09-13 LAB — HEPATIC FUNCTION PANEL
ALT: 12 U/L (ref 0–35)
AST: 15 U/L (ref 0–37)
Albumin: 3.7 g/dL (ref 3.5–5.2)
Alkaline Phosphatase: 99 U/L (ref 39–117)
Bilirubin, Direct: 0.1 mg/dL (ref 0.0–0.3)
Total Bilirubin: 0.2 mg/dL (ref 0.2–1.2)
Total Protein: 7.1 g/dL (ref 6.0–8.3)

## 2024-09-13 LAB — HEMOGLOBIN A1C: Hgb A1c MFr Bld: 5.6 % (ref 4.6–6.5)

## 2024-09-13 NOTE — Progress Notes (Unsigned)
 Patient ID: Alexandra Beck, female   DOB: May 28, 1981, 43 y.o.   MRN: [id number]        Chief Complaint: follow up HTN, HLD, dm,low vit d, diarrhea       HPI:  Alexandra Beck is a 43 y.o. 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  No fever, blood, worsening abd pain, n/v, dysphagia.  Pt denies chest pain, increased sob or doe, wheezing, orthopnea, PND, increased LE swelling, palpitations, dizziness or syncope.   Pt denies polydipsia, polyuria, or new focal neuro s/s.  Denies worsening depressive symptoms, suicidal ideation, or panic.  Sees counseling and psychiatry regularly.  Due for flu shot, mammogram. Lost wt from 383 now 308 after roux n y gastric bypass.  Also to f/u with colorectal surgury at [facility] soon with know colovesicle fistula not yet treated.  Tolerating crestor  20 mg daily.  Has not seen GI.  Wt Readings from Last 3 Encounters:  09/13/24 (!) 308 lb (139.7 kg)  09/09/24 (!) 312 lb 6.4 oz (141.7 kg)  09/02/24 (!) 313 lb 9.6 oz (142.2 kg)   BP Readings from Last 3 Encounters:  09/13/24 132/82  09/09/24 116/78  09/02/24 103/67         Past Medical History:  Diagnosis Date   Acute renal failure (ARF) 11/2012    multifactorial-likely secondary to ATN in the setting of sepsis and hypotension, and also from rhabdomyolysis   Anemia    Cellulitis    Chronic diastolic CHF (congestive heart failure) (HCC)    Depression    Diabetes mellitus (HCC)    GERD (gastroesophageal reflux disease)    Hyperlipidemia    Hypertension    Hypothyroidism    Morbid obesity with BMI of 70 and over, adult (HCC)    Obesity hypoventilation syndrome (HCC)    OSA (obstructive sleep apnea)    PVC's (premature ventricular contractions)    Recurrent cellulitis of lower leg    LLE, venous insuff   Sepsis (HCC) 11/2012   Secondary to  cellulitis   Sleep apnea    Venous insufficiency of leg    Past Surgical History:  Procedure Laterality Date   GASTRIC BYPASS  06/12/2024   IR FLUORO GUIDE CV MIDLINE PICC RIGHT  03/28/2017   IR US  GUIDE VASC ACCESS RIGHT  03/28/2017   WISDOM TOOTH EXTRACTION      reports that she quit smoking about 14 years ago. Her smoking use included cigarettes. She started smoking about 22 years ago. She has a 4 pack-year smoking history. She has never used smokeless tobacco. She reports that she does not drink alcohol and does not use drugs. family history includes Diabetes in her mother; Heart disease in her father; High Cholesterol in her father and mother; Hypertension in her father and mother; Obesity in her father and mother; Sleep apnea in her father and mother; Thyroid  disease in her mother. Allergies  Allergen Reactions   Aspirin Hives, Swelling and Other (See Comments)    REACTION: throat swelling, hives  Other reaction(s): Unknown  REACTION: throat swelling, hives Other reaction(s): Unknown Other reaction(s): Unknown   Doxycycline Other (See Comments)    Abdominal pain, difficulty swallowing   Lisinopril Cough   Niacin     Other reaction(s): Unknown Other reaction(s): Unknown   Niacin And Related  Other reaction(s): Unknown Other reaction(s): Unknown   Niaspan [Niacin Er (Antihyperlipidemic)]     Caused flushing   Wound Dressing Adhesive Itching    Welts and burning of the skin   Wound Dressings Itching    Welts and burning of the skin   Current Outpatient Medications on File Prior to Visit  Medication Sig Dispense Refill   cariprazine  (VRAYLAR ) 1.5 MG capsule Take 1 capsule (1.5 mg total) by mouth daily. 30 capsule 2   lamoTRIgine  (LAMICTAL ) 200 MG tablet Take 1 tablet (200 mg total) by mouth daily. 90 tablet 0   levothyroxine  (SYNTHROID ) 150 MCG tablet Take 1 tablet (150 mcg total) by mouth daily before breakfast. Must keep scheduled appt for future refills 90 tablet 3    metoprolol  succinate (TOPROL -XL) 50 MG 24 hr tablet Take 1 tablet (50 mg total) by mouth daily. Take with or immediately following a meal. 90 tablet 3   modafinil  (PROVIGIL ) 100 MG tablet Take 1 tablet (100 mg total) by mouth daily, along with 200mg  tablet. 30 tablet 3   modafinil  (PROVIGIL ) 200 MG tablet Take 1 tablet (200 mg total) by mouth daily at 9 A.M. 30 tablet 3   omeprazole  (PRILOSEC) 20 MG capsule Take 20 mg by mouth.     rosuvastatin  (CRESTOR ) 20 MG tablet Take 1 tablet (20 mg total) by mouth daily. 90 tablet 3   sertraline  (ZOLOFT ) 100 MG tablet Take 2 tablets (200 mg total) by mouth daily. 180 tablet 0   spironolactone  (ALDACTONE ) 25 MG tablet Take 0.5 tablets (12.5 mg total) by mouth daily. 45 tablet 3   torsemide  (DEMADEX ) 20 MG tablet Take 4 tablets (80 mg total) by mouth 2 (two) times daily as needed. 720 tablet 2   fluconazole  (DIFLUCAN ) 150 MG tablet Take 1 tablet (150 mg total) by mouth every three (3) days as needed. (Patient not taking: Reported on 09/13/2024) 2 tablet 1   metFORMIN  (GLUCOPHAGE ) 500 MG tablet Take 1 tablet (500 mg total) by mouth daily. Must keep scheduled appt for future refills (Patient not taking: Reported on 09/13/2024) 90 tablet 3   Semaglutide , 2 MG/DOSE, 8 MG/3ML SOPN Inject 2 mg into the skin as directed once a week. (Patient not taking: Reported on 09/13/2024) 3 mL 0   ursodiol  (ACTIGALL ) 300 MG capsule Take 1 capsule (300 mg total) by mouth 2 (two) times daily. (Patient not taking: Reported on 09/13/2024) 60 capsule 5   Vitamin D , Ergocalciferol , (DRISDOL ) 1.25 MG (50000 UNIT) CAPS capsule Take 1 capsule (50,000 Units total) by mouth every 7 (seven) days. (Patient not taking: Reported on 09/13/2024) 4 capsule 0   No current facility-administered medications on file prior to visit.        ROS:  All others reviewed and negative.  Objective        PE:  BP 132/82 (BP Location: Left Arm, Patient Position: Sitting, Cuff Size: Normal)   Pulse (!) 58    Temp 98.1 F (36.7 C) (Oral)   Ht 5' 2.5 (1.588 m)   Wt (!) 308 lb (139.7 kg)   LMP 09/05/2024   SpO2 98%   BMI 55.44 kg/m                 Constitutional: Pt appears in NAD               HENT: Head: NCAT.                Right Ear: External ear normal.  Left Ear: External ear normal.                Eyes: . Pupils are equal, round, and reactive to light. Conjunctivae and EOM are normal               Nose: without d/c or deformity               Neck: Neck supple. Gross normal ROM               Cardiovascular: Normal rate and regular rhythm.                 Pulmonary/Chest: Effort normal and breath sounds without rales or wheezing.                Abd:  Soft, NT, ND, + BS, no organomegaly               Neurological: Pt is alert. At baseline orientation, motor grossly intact               Skin: Skin is warm. No rashes, no other new lesions, LE edema - none               Psychiatric: Pt behavior is normal without agitation   Micro: none  Cardiac tracings I have personally interpreted today:  none  Pertinent Radiological findings (summarize): none   Lab Results  Component Value Date   WBC 5.6 09/13/2024   HGB 9.3 (L) 09/13/2024   HCT 30.0 (L) 09/13/2024   PLT 269.0 09/13/2024   GLUCOSE 76 09/13/2024   CHOL 158 09/13/2024   TRIG 128.0 09/13/2024   HDL 54.80 09/13/2024   LDLDIRECT 141.0 03/02/2016   LDLCALC 77 09/13/2024   ALT 12 09/13/2024   AST 15 09/13/2024   NA 142 09/13/2024   K 3.7 09/13/2024   CL 101 09/13/2024   CREATININE 0.99 09/13/2024   BUN 19 09/13/2024   CO2 32 09/13/2024   TSH 2.040 03/13/2023   INR 1.3 (H) 02/07/2024   HGBA1C 5.6 09/13/2024   MICROALBUR 8.05 (H) 06/26/2007   Assessment/Plan:  Alexandra Beck is a 43 y.o. White or Caucasian [1] female with  has a past medical history of Acute renal failure (ARF) (11/2012), Anemia, Cellulitis, Chronic diastolic CHF (congestive heart failure) (HCC), Depression, Diabetes mellitus (HCC), GERD  (gastroesophageal reflux disease), Hyperlipidemia, Hypertension, Hypothyroidism, Morbid obesity with BMI of 70 and over, adult (HCC), Obesity hypoventilation syndrome (HCC), OSA (obstructive sleep apnea), PVC's (premature ventricular contractions), Recurrent cellulitis of lower leg, Sepsis (HCC) (11/2012), Sleep apnea, and Venous insufficiency of leg.  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) With obesity  Lab Results  Component Value Date   HGBA1C 5.6 09/13/2024   Stable, pt to continue current medical treatment metfomrin 500 mg every day, ozemipc 2 mg weekly    Vitamin D  deficiency Last vitamin D  Lab Results  Component Value Date   VD25OH 39.4 11/13/2023   Low, to start oral replacement   Hypothyroidism Lab Results  Component Value Date   TSH 2.040 03/13/2023   Stable, pt to continue levothyroxine  150 mcg qd   Hypertension associated with diabetes (HCC) BP Readings from Last 3 Encounters:  09/13/24 132/82  09/09/24 116/78  09/02/24 103/67   Stable, pt to continue medical treatment toprol  xl 50  mg qd   Hyperlipidemia associated with type 2 diabetes mellitus [facility]) Lab Results  Component Value Date   LDLCALC 77 09/13/2024  Uncontrolled mild, pt to continue current statin crestor  20 mg every day and lower chol diet, likely to improve with further wt loss, declines other change for now   Diarrhea Chronic it seems, exam little change , now s/p roux n y surgury, afeb, nontoxic, but with persistent symptoms, now for GI pathogen panel, refer GI  Depression Stable, cont counseling and psychiatry as planned  Followup: Return in about 6 months (around 03/14/2025).  Lynwood Rush, MD 09/14/2024 10:14 AM [facility] [place] [facility] - [facility]

## 2024-09-13 NOTE — Patient Instructions (Signed)
 You had the flu shot today  Please continue all other medications as before, and refills have been done if requested.  Please have the pharmacy call with any other refills you may need.  Please continue your efforts at being more active, low cholesterol diet, and weight control.  You are otherwise up to date with prevention measures today.  Please keep your appointments with your specialists as you may have planned  You will be contacted regarding the referral for: Gastroenterology, and mammogram  Please go to the LAB at the blood drawing area for the tests to be done - for blood and stool testing  You will be contacted by phone if any changes need to be made immediately.  Otherwise, you will receive a letter about your results with an explanation, but please check with MyChart first.  Please make an Appointment to return in 6 months, or sooner if needed

## 2024-09-13 NOTE — Progress Notes (Signed)
 The test results show that your current treatment is OK, as the tests are stable.  Please continue the same plan.  There is no other need for change of treatment or further evaluation based on these results, at this time.  thanks

## 2024-09-13 NOTE — Assessment & Plan Note (Signed)
 With obesity  Lab Results  Component Value Date   HGBA1C 5.6 09/13/2024   Stable, pt to continue current medical treatment metfomrin 500 mg every day, ozemipc 2 mg weekly

## 2024-09-14 ENCOUNTER — Encounter: Payer: Self-pay | Admitting: Internal Medicine

## 2024-09-14 NOTE — Assessment & Plan Note (Signed)
 Stable, cont counseling and psychiatry as planned

## 2024-09-14 NOTE — Assessment & Plan Note (Signed)
 Lab Results  Component Value Date   LDLCALC 77 09/13/2024   Uncontrolled mild, pt to continue current statin crestor  20 mg every day and lower chol diet, likely to improve with further wt loss, declines other change for now

## 2024-09-14 NOTE — Assessment & Plan Note (Signed)
 Chronic it seems, exam little change , now s/p roux n y surgury, afeb, nontoxic, but with persistent symptoms, now for GI pathogen panel, refer GI

## 2024-09-14 NOTE — Assessment & Plan Note (Signed)
 BP Readings from Last 3 Encounters:  09/13/24 132/82  09/09/24 116/78  09/02/24 103/67   Stable, pt to continue medical treatment toprol  xl 50  mg qd

## 2024-09-14 NOTE — Assessment & Plan Note (Signed)
Lab Results  Component Value Date   TSH 2.040 03/13/2023   Stable, pt to continue levothyroxine 150 mcg qd

## 2024-09-14 NOTE — Assessment & Plan Note (Signed)
 Last vitamin D  Lab Results  Component Value Date   VD25OH 39.4 11/13/2023   Low, to start oral replacement

## 2024-09-16 ENCOUNTER — Encounter: Payer: Self-pay | Admitting: Hematology and Oncology

## 2024-09-16 ENCOUNTER — Inpatient Hospital Stay: Admitting: Hematology and Oncology

## 2024-09-16 ENCOUNTER — Ambulatory Visit: Payer: Self-pay | Admitting: Hematology and Oncology

## 2024-09-16 ENCOUNTER — Inpatient Hospital Stay: Attending: Hematology and Oncology

## 2024-09-16 VITALS — BP 121/54 | HR 72 | Temp 97.7°F | Resp 16 | Wt 304.8 lb

## 2024-09-16 DIAGNOSIS — D509 Iron deficiency anemia, unspecified: Secondary | ICD-10-CM | POA: Insufficient documentation

## 2024-09-16 LAB — CBC WITH DIFFERENTIAL/PLATELET
Abs Immature Granulocytes: 0.02 K/uL (ref 0.00–0.07)
Basophils Absolute: 0 K/uL (ref 0.0–0.1)
Basophils Relative: 0 %
Eosinophils Absolute: 0.1 K/uL (ref 0.0–0.5)
Eosinophils Relative: 3 %
HCT: 30 % — ABNORMAL LOW (ref 36.0–46.0)
Hemoglobin: 9 g/dL — ABNORMAL LOW (ref 12.0–15.0)
Immature Granulocytes: 0 %
Lymphocytes Relative: 20 %
Lymphs Abs: 1 K/uL (ref 0.7–4.0)
MCH: 25.1 pg — ABNORMAL LOW (ref 26.0–34.0)
MCHC: 30 g/dL (ref 30.0–36.0)
MCV: 83.6 fL (ref 80.0–100.0)
Monocytes Absolute: 0.5 K/uL (ref 0.1–1.0)
Monocytes Relative: 10 %
Neutro Abs: 3.4 K/uL (ref 1.7–7.7)
Neutrophils Relative %: 67 %
Platelets: 269 K/uL (ref 150–400)
RBC: 3.59 MIL/uL — ABNORMAL LOW (ref 3.87–5.11)
RDW: 21.7 % — ABNORMAL HIGH (ref 11.5–15.5)
WBC: 5.1 K/uL (ref 4.0–10.5)
nRBC: 0 % (ref 0.0–0.2)

## 2024-09-16 LAB — CMP (CANCER CENTER ONLY)
ALT: 9 U/L (ref 0–44)
AST: 11 U/L — ABNORMAL LOW (ref 15–41)
Albumin: 3.5 g/dL (ref 3.5–5.0)
Alkaline Phosphatase: 94 U/L (ref 38–126)
Anion gap: 7 (ref 5–15)
BUN: 21 mg/dL — ABNORMAL HIGH (ref 6–20)
CO2: 33 mmol/L — ABNORMAL HIGH (ref 22–32)
Calcium: 9.5 mg/dL (ref 8.9–10.3)
Chloride: 102 mmol/L (ref 98–111)
Creatinine: 1.07 mg/dL — ABNORMAL HIGH (ref 0.44–1.00)
GFR, Estimated: >60 mL/min (ref >60)
Glucose, Bld: 90 mg/dL (ref 70–99)
Potassium: 3.7 mmol/L (ref 3.5–5.1)
Sodium: 142 mmol/L (ref 135–145)
Total Bilirubin: 0.3 mg/dL (ref 0.0–1.2)
Total Protein: 6.6 g/dL (ref 6.5–8.1)

## 2024-09-16 LAB — IRON AND IRON BINDING CAPACITY (CC-WL,HP ONLY)
Iron: 35 ug/dL (ref 28–170)
Saturation Ratios: 12 % (ref 10.4–31.8)
TIBC: 288 ug/dL (ref 250–450)
UIBC: 253 ug/dL (ref 148–442)

## 2024-09-16 LAB — FERRITIN: Ferritin: 180 ng/mL (ref 11–307)

## 2024-09-16 NOTE — Progress Notes (Signed)
 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 She just completed 3 weekly doses of venofer . Her hb today is 9, but ferritin has remarkably improved. I think its more than likely she didn't have adequate time after iron  infusion to synthesize RBC At this time, we plan to repeat labs in 8 weeks. Agree with further GI evaluation, if CBC in 8 weeks still with inadequate response. She recently at her PCP visit has apparently requested GI consult because of some abdominal issues. Once again, she has excellent questions about pathophysiology of anemia and possible treatment options.   HISTORY OF PRESENTING ILLNESS:  Alexandra Beck 43 y.o. female is here because of anemia.   History of Present Illness    Alexandra Beck is a 43 year old female with iron  deficiency anemia who presents for follow up.  Discussed the use of AI scribe software for clinical note transcription with the patient, who gave verbal consent to proceed.  History of Present Illness Alexandra Beck is a 43 year old female with iron  deficiency anemia who presents for follow up.  She had iron  infusions since her last visit, last one completed about a week ago. She says she still feels pretty terrible, didn't quite feel much different. She tolerated iron  infusion well. She denies any hematochezia or melena. She has menstrual cycles, doesn't describe them as heavy.  All other systems were reviewed with the patient and are negative.  MEDICAL HISTORY:  Past Medical History:  Diagnosis Date   Acute renal failure (ARF) 11/2012    multifactorial-likely secondary to ATN in the setting of sepsis and hypotension, and also  from rhabdomyolysis   Anemia    Cellulitis    Chronic diastolic CHF (congestive heart failure) (HCC)    Depression    Diabetes mellitus (HCC)    GERD (gastroesophageal reflux disease)    Hyperlipidemia    Hypertension    Hypothyroidism    Morbid obesity with BMI of 70 and over, adult (HCC)    Obesity hypoventilation syndrome (HCC)    OSA (obstructive sleep apnea)    PVC's (premature ventricular contractions)    Recurrent cellulitis of lower leg    LLE, venous insuff   Sepsis (HCC) 11/2012   Secondary to cellulitis   Sleep apnea    Venous insufficiency of leg     SURGICAL HISTORY: Past Surgical History:  Procedure Laterality Date   GASTRIC BYPASS  06/12/2024   IR FLUORO GUIDE CV MIDLINE PICC RIGHT  03/28/2017   IR US  GUIDE VASC ACCESS RIGHT  03/28/2017   WISDOM TOOTH EXTRACTION      SOCIAL HISTORY: Social History   Socioeconomic History   Marital status: Married    Spouse name: Not on file   Number of children: 0   Years of education: Not on file   Highest education level: Not on file  Occupational History   Occupation: Librarian    Employer: [employer]  Tobacco Use   Smoking status: Former    Current packs/day: 0.00    Average packs/day: 0.5 packs/day for 8.0 years (4.0 ttl pk-yrs)    Types: Cigarettes    Start date: 2003    Quit date: 2011  Years since quitting: 14.8   Smokeless tobacco: Never  Vaping Use   Vaping status: Never Used  Substance and Sexual Activity   Alcohol use: No    Alcohol/week: 0.0 standard drinks of alcohol   Drug use: No   Sexual activity: Not Currently    Birth control/protection: None  Other Topics Concern   Not on file  Social History Narrative   Librarian, 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)   Social Drivers of Corporate investment banker Strain: Low Risk  (06/13/2024)   Received from [facility]   Overall Financial Resource Strain (CARDIA)    How hard is it for you to pay for the very basics like food,  housing, medical care, and heating?: Not hard at all  Food Insecurity: No Food Insecurity (06/13/2024)   Received from [facility]   Hunger Vital Sign    Within the past 12 months, you worried that your food would run out before you got the money to buy more.: Never true    Within the past 12 months, the food you bought just didn't last and you didn't have money to get more.: Never true  Transportation Needs: No Transportation Needs (06/13/2024)   Received from [facility]   PRAPARE - Transportation    Lack of Transportation (Medical): No    Lack of Transportation (Non-Medical): No  Physical Activity: Not on file  Stress: Not on file  Social Connections: Not on file  Intimate Partner Violence: Not At Risk (02/12/2024)   Humiliation, Afraid, Rape, and Kick questionnaire    Fear of Current or Ex-Partner: No    Emotionally Abused: No    Physically Abused: No    Sexually Abused: No    FAMILY HISTORY: Family History  Problem Relation Age of Onset   Hypertension Mother    Diabetes Mother    High Cholesterol Mother    Thyroid  disease Mother    Sleep apnea Mother    Obesity Mother    Hypertension Father    Heart disease Father        before age 78   High Cholesterol Father    Sleep apnea Father    Obesity Father     ALLERGIES:  is allergic to aspirin, doxycycline, lisinopril, niacin, niacin and related, niaspan [niacin er (antihyperlipidemic)], wound dressing adhesive, and wound dressings.  MEDICATIONS:  Current Outpatient Medications  Medication Sig Dispense Refill   cariprazine  (VRAYLAR ) 1.5 MG capsule Take 1 capsule (1.5 mg total) by mouth daily. 30 capsule 2   fluconazole  (DIFLUCAN ) 150 MG tablet Take 1 tablet (150 mg total) by mouth every three (3) days as needed. (Patient not taking: Reported on 09/13/2024) 2 tablet 1   lamoTRIgine  (LAMICTAL ) 200 MG tablet Take 1 tablet (200 mg total) by mouth daily. 90 tablet 0   levothyroxine  (SYNTHROID ) 150 MCG tablet Take 1  tablet (150 mcg total) by mouth daily before breakfast. Must keep scheduled appt for future refills 90 tablet 3   metFORMIN  (GLUCOPHAGE ) 500 MG tablet Take 1 tablet (500 mg total) by mouth daily. Must keep scheduled appt for future refills (Patient not taking: Reported on 09/13/2024) 90 tablet 3   metoprolol  succinate (TOPROL -XL) 50 MG 24 hr tablet Take 1 tablet (50 mg total) by mouth daily. Take with or immediately following a meal. 90 tablet 3   modafinil  (PROVIGIL ) 100 MG tablet Take 1 tablet (100 mg total) by mouth daily, along with 200mg  tablet. 30 tablet 3  modafinil  (PROVIGIL ) 200 MG tablet Take 1 tablet (200 mg total) by mouth daily at 9 A.M. 30 tablet 3   omeprazole  (PRILOSEC) 20 MG capsule Take 20 mg by mouth.     rosuvastatin  (CRESTOR ) 20 MG tablet Take 1 tablet (20 mg total) by mouth daily. 90 tablet 3   Semaglutide , 2 MG/DOSE, 8 MG/3ML SOPN Inject 2 mg into the skin as directed once a week. (Patient not taking: Reported on 09/13/2024) 3 mL 0   sertraline  (ZOLOFT ) 100 MG tablet Take 2 tablets (200 mg total) by mouth daily. 180 tablet 0   spironolactone  (ALDACTONE ) 25 MG tablet Take 0.5 tablets (12.5 mg total) by mouth daily. 45 tablet 3   torsemide  (DEMADEX ) 20 MG tablet Take 4 tablets (80 mg total) by mouth 2 (two) times daily as needed. 720 tablet 2   ursodiol  (ACTIGALL ) 300 MG capsule Take 1 capsule (300 mg total) by mouth 2 (two) times daily. (Patient not taking: Reported on 09/13/2024) 60 capsule 5   Vitamin D , Ergocalciferol , (DRISDOL ) 1.25 MG (50000 UNIT) CAPS capsule Take 1 capsule (50,000 Units total) by mouth every 7 (seven) days. (Patient not taking: Reported on 09/13/2024) 4 capsule 0   No current facility-administered medications for this visit.     PHYSICAL EXAMINATION: ECOG PERFORMANCE STATUS: 0 - Asymptomatic  Vitals:   09/16/24 1211  BP: (!) 121/54  Pulse: 72  Resp: 16  Temp: 97.7 F (36.5 C)  SpO2: 93%   Filed Weights   09/16/24 1211  Weight: (!) 304 lb  12.8 oz (138.3 kg)    GENERAL:alert, no distress and comfortable, morbidly obese. Appears pale. N   LABORATORY DATA:  I have reviewed the data as listed Lab Results  Component Value Date   WBC 5.1 09/16/2024   HGB 9.0 (L) 09/16/2024   HCT 30.0 (L) 09/16/2024   MCV 83.6 09/16/2024   PLT 269 09/16/2024     Chemistry      Component Value Date/Time   NA 142 09/16/2024 1149   NA 142 11/13/2023 1325   K 3.7 09/16/2024 1149   CL 102 09/16/2024 1149   CO2 33 (H) 09/16/2024 1149   BUN 21 (H) 09/16/2024 1149   BUN 20 11/13/2023 1325   CREATININE 1.07 (H) 09/16/2024 1149   CREATININE 1.06 11/14/2016 1050      Component Value Date/Time   CALCIUM  9.5 09/16/2024 1149   CALCIUM  9.6 06/26/2007 0000   ALKPHOS 94 09/16/2024 1149   AST 11 (L) 09/16/2024 1149   ALT 9 09/16/2024 1149   BILITOT 0.3 09/16/2024 1149       RADIOGRAPHIC STUDIES: I have personally reviewed the radiological images as listed and agreed with the findings in the report. DG Chest 2 View Result Date: 08/20/2024 EXAM: 2 VIEW(S) XRAY OF THE CHEST 08/20/2024 04:11:00 PM COMPARISON: 02/06/2024 CLINICAL HISTORY: ABd pain, SOB. Reason for exam: abdominal pain and SOB starting earlier today; pt has hx of diverticulitis; pt had gastric bypass last year; Per triage: Patient arrives in wheelchair by POV c/o generalized abdominal pain and shortness of breath onset of 2 hours ago. Patient reports nausea and diarrhea. FINDINGS: LUNGS AND PLEURA: No focal pulmonary opacity. No pulmonary edema. No pleural effusion. No pneumothorax. HEART AND MEDIASTINUM: No acute abnormality of the cardiac and mediastinal silhouettes. BONES AND SOFT TISSUES: No acute osseous abnormality. IMPRESSION: 1. Normal chest radiograph. No acute cardiopulmonary process. Electronically signed by: Waddell Calk MD 08/20/2024 04:26 PM EDT RP Workstation: [id number]     All  questions were answered. The patient knows to call the clinic with any problems, questions  or concerns. I spent 30 minutes in the care of this patient including H and P, review of records, counseling and coordination of care.     Amber Stalls, MD 09/16/2024 12:20 PM

## 2024-09-17 ENCOUNTER — Telehealth (HOSPITAL_BASED_OUTPATIENT_CLINIC_OR_DEPARTMENT_OTHER): Payer: Self-pay

## 2024-09-17 NOTE — Telephone Encounter (Signed)
 Chart notes sent to [facility] for CPAP supplies

## 2024-09-18 ENCOUNTER — Ambulatory Visit: Admitting: Psychology

## 2024-09-18 ENCOUNTER — Ambulatory Visit: Attending: Sports Medicine

## 2024-09-18 DIAGNOSIS — F422 Mixed obsessional thoughts and acts: Secondary | ICD-10-CM | POA: Diagnosis not present

## 2024-09-18 DIAGNOSIS — F428 Other obsessive-compulsive disorder: Secondary | ICD-10-CM

## 2024-09-18 DIAGNOSIS — F331 Major depressive disorder, recurrent, moderate: Secondary | ICD-10-CM | POA: Diagnosis not present

## 2024-09-18 DIAGNOSIS — F50819 Binge eating disorder, unspecified: Secondary | ICD-10-CM

## 2024-09-18 DIAGNOSIS — F4310 Post-traumatic stress disorder, unspecified: Secondary | ICD-10-CM | POA: Diagnosis not present

## 2024-09-18 NOTE — Progress Notes (Unsigned)
 [facility] Counselor/Therapist Progress Note  Patient ID: Alexandra Beck, MRN: [id number],    Date: 09/18/2024  Time spent: 58 minutes  Time In:  1:09 Time out:2:07  Treatment Type: Individual Therapy  Reported Symptoms: sadness,   Mental Status Exam: Appearance:  Casual     Behavior: Appropriate  Motor: Normal  Speech/Language:  Normal Rate  Affect: blunted  Mood: pleasant  Thought process: normal  Thought content:   WNL  Sensory/Perceptual disturbances:   WNL  Orientation: oriented to person, place, time/date, and situation  Attention: Good  Concentration: Good  Memory: WNL  Fund of knowledge:  Good  Insight:   Good  Judgment:  Good  Impulse Control: Good   Risk Assessment: Danger to Self:  No Self-injurious Behavior: No Danger to Others: No Duty to Warn:no Physical Aggression / Violence:No  Access to Firearms a concern: No  Gang Involvement:No   Subjective: The patient attended a face-to-face individual therapy session via video visit today.  The patient gave verbal consent for the session to be on caregility.  The patient is aware of the limitations of telehealth and she was in her home alone and the therapist was in the office.  The patient made arrangements to do the session via video visit today because she said that she had a work engagement that she had to attend to.  I am getting the sense that she is not totally on board with doing EMDR about her trauma.  We will navigate this and discuss this as we move forward.  We talked today about her going to see her primary care physician and how she is doing with her eating program and it seems that she is doing well and has lost a significant amount of weight distance she had her surgery in July.  She does have issues with body dysmorphia and we talked about looking at whether she wants to pursue skin surgery moving forward after she waits for the time that she needs to wait for.  She does seem to be taking a  little more pride in her parents and ask about how to get her lashes done and her eyebrows done.  I think this is a good sign as I think she does feel better about her parents now that she is lost a significant amount of weight.  We will continue to work around the body dysmorphia and I did give her the op of just doing the EMDR on video as it seems to be more convenient to her job.   Interventions: Cognitive Behavioral Therapy, Assertiveness/Communication, and Problem solving, psycho education  Diagnosis:Major depressive disorder, recurrent episode, moderate (HCC)  Obsessional thoughts  PTSD (post-traumatic stress disorder)  Binge eating disorder, unspecified severity  Plan: Treatment Plan  Strengths/Abilities:  Intelligent, ability for insight, supportive wife  Treatment Preferences:  Outpatient Individual therapy  Statement of Needs:  I Need help with my depression and motivation.   Symptoms:Depressed or irritable mood.:(Status: improved). Diminished interest in or  enjoyment of activities.(Status: improved). Feelings of hopelessness,  worthlessness, or inappropriate guilt.: (Status: improved). History of chronic or recurrent depression for which the client has taken antidepressant medication, been hospitalized, had  outpatient treatment, or had a course of electroconvulsive therapy.(Status:  maintained). Lack of energy.: (Status: maintained). Low self-esteem.:  (Status: improved). Poor concentration and indecisiveness.:  (Status: improved). Social withdrawal.:  (Status: maintained).  Unresolved grief issues.: (Status: maintained).  Problems Addressed:  Unipolar Depression  Goals:  LTG:1. Alleviate depressive symptoms and return  to previous level of effective  functioning.  70% 2. Appropriately grieve the loss in order to normalize mood and to return  to previously adaptive level of functioning. 3. Develop healthy interpersonal relationships that lead to the alleviation  and  help prevent the relapse of depression.  70% 4. Develop healthy thinking patterns and beliefs about self, others, and the world that lead to the alleviation and help prevent the relapse of  Depression  70% STG:1.Describe current and past experiences with depression including their impact on functioning and  attempts to resolve it.  70 % 2. Identify and replace thoughts and beliefs that support depression.  70 % 3.Learn and implement behavioral strategies to overcome depression. 70% 4.Verbalize an understanding of healthy and unhealthy emotions with the intent of increasing the use of  healthy emotions to guide actions.  50 %  Target Date:  10/07/2025 Frequency: Bi Weekly Modality:Individual Interventions by Therapist:  CBT, problem solving, EMDR, insight oriented  Patient approved Treatment Plan  Artia Singley [name], LCSW

## 2024-09-18 NOTE — Therapy (Deleted)
 OUTPATIENT PHYSICAL THERAPY THORACOLUMBAR EVALUATION   Patient Name: Alexandra Beck MRN: [id number] DOB:[birth year], 43 y.o., female Today's Date: 09/18/2024  END OF SESSION:   Past Medical History:  Diagnosis Date   Acute renal failure (ARF) 11/2012    multifactorial-likely secondary to ATN in the setting of sepsis and hypotension, and also from rhabdomyolysis   Anemia    Cellulitis    Chronic diastolic CHF (congestive heart failure) (HCC)    Depression    Diabetes mellitus (HCC)    GERD (gastroesophageal reflux disease)    Hyperlipidemia    Hypertension    Hypothyroidism    Morbid obesity with BMI of 70 and over, adult (HCC)    Obesity hypoventilation syndrome (HCC)    OSA (obstructive sleep apnea)    PVC's (premature ventricular contractions)    Recurrent cellulitis of lower leg    LLE, venous insuff   Sepsis (HCC) 11/2012   Secondary to cellulitis   Sleep apnea    Venous insufficiency of leg    Past Surgical History:  Procedure Laterality Date   GASTRIC BYPASS  06/12/2024   IR FLUORO GUIDE CV MIDLINE PICC RIGHT  03/28/2017   IR US  GUIDE VASC ACCESS RIGHT  03/28/2017   WISDOM TOOTH EXTRACTION     Patient Active Problem List   Diagnosis Date Noted   Iron  deficiency anemia, unspecified 08/19/2024   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   Yeast vaginitis 02/16/2024   Severe sepsis (HCC) 02/07/2024   UTI (urinary tract infection) 02/07/2024   IDA (iron  deficiency anemia) 11/13/2023   Diverticulitis of large intestine with abscess 09/21/2023   Diarrhea 09/24/2022   Abdominal pain 09/23/2022   Encounter for well adult exam with abnormal findings 06/17/2022   Bilateral hearing loss 06/17/2022   Anemia 06/17/2022   Well woman exam 06/17/2022   Fever 05/14/2022   Hypokalemia 05/14/2022   Lightheadedness 05/14/2022   Shortness of breath 05/14/2022   Pneumonia 05/13/2022   Eczema, dyshidrotic 09/19/2021   Chronic pain 09/19/2021   Cellulitis and abscess of neck 05/14/2021    Diabetes mellitus (HCC) 10/19/2020   Psoriasis 12/26/2019   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   Hypersomnolence 05/11/2018   Vitamin D  deficiency 05/02/2018   Cellulitis of left leg 04/17/2017   Cellulitis of right leg 01/11/2017   PVC's (premature ventricular contractions) 12/02/2016   Chronic diastolic CHF (congestive heart failure) (HCC)    AKI (acute kidney injury)    Hyperlipidemia associated with type 2 diabetes mellitus (HCC)    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)    Hypothyroidism    GERD (gastroesophageal reflux disease)    Depression    Venous insufficiency of leg    Recurrent cellulitis of lower leg 12/25/2012   OSA (obstructive sleep apnea) 12/25/2012   Obesity hypoventilation syndrome (HCC) 12/25/2012   Morbid obesity (HCC) 12/24/2012   Hypertension associated with diabetes (HCC) 06/26/2007    PCP: Norleen Lynwood ORN, MD   REFERRING PROVIDER: Leonce Katz, DO  REFERRING DIAG: [id number] (ICD-10-CM) - Strain of tendon of right foot and ankle, sequela M25.571,G89.29 (ICD-10-CM) - Chronic pain of right ankle M79.671 (ICD-10-CM) - Right foot pain M51.361 (ICD-10-CM) - Degeneration of intervertebral disc of lumbar region with lower extremity pain  Rationale for Evaluation and Treatment: Rehabilitation  THERAPY DIAG:  No diagnosis found.  ONSET DATE: chronic  SUBJECTIVE:  SUBJECTIVE STATEMENT: Patient states intermittent right ankle pain. Pain starts in the achilles and moves through the medial aspect of the foot. Pain generally comes in the am. A year of pain. Decreased ROM. When in flare she isnt able to walk and or go to work. No MOI. When in flare she is not able to do ADLs. Feels like her ankle is being  crushed when she is in flare. She states she feels hobbled  PERTINENT HISTORY:  1. Strain of tendon of right foot and ankle, sequela (Primary) 2. Chronic pain of right ankle 3. Right foot pain 4. Degeneration of intervertebral disc of lumbar region with lower extremity pain -Chronic with exacerbation, subsequent visit - Intermittent, intense pain of right ankle and foot without MOI and primarily occurring upon awakening.  Suspicious for lumbar radiculopathy due to pain occurring in the morning before weightbearing and no specific MOI, and degenerative changes seen on prior lumbar MRI - Recommend right sided epidural CSI to L5-S1 - Recommend starting HEP and physical therapy targeting low back and right foot and ankle - X-rays obtained in clinic.  My interpretation: No acute fracture or dislocation.  Degenerative changes of navicular bone, medial and lateral ankle joint space, posterior patellar enthesophyte  PAIN:  Are you having pain? Yes: NPRS scale: *** Pain location: *** Pain description: *** Aggravating factors: *** Relieving factors: ***  PRECAUTIONS: None  RED FLAGS: None   WEIGHT BEARING RESTRICTIONS: No  FALLS:  Has patient fallen in last 6 months? No  OCCUPATION: ***  PLOF: Independent  PATIENT GOALS: To manage my symptoms  NEXT MD VISIT: ***  OBJECTIVE:  Note: Objective measures were completed at Evaluation unless otherwise noted.  DIAGNOSTIC FINDINGS:  Norleen Lynwood ORN, MD   PATIENT SURVEYS:  Modified Oswestry:  MODIFIED OSWESTRY DISABILITY SCALE  Date: *** Score  Pain intensity {ODI 1:32962}  2. Personal care (washing, dressing, etc.) {ODI 2:32963}  3. Lifting {ODI 3:32964}  4. Walking {ODI 4:32965}  5. Sitting {ODI 5:32966}  6. Standing {ODI 6:32967}  7. Sleeping {ODI 7:32968}  8. Social Life {ODI 8:32969}  9. Traveling {ODI 9:32970}  10. Employment/ Homemaking {ODI 10:32971}  Total ***/50   Interpretation of scores: Score Category Description   0-20% Minimal Disability The patient can cope with most living activities. Usually no treatment is indicated apart from advice on lifting, sitting and exercise  21-40% Moderate Disability The patient experiences more pain and difficulty with sitting, lifting and standing. Travel and social life are more difficult and they may be disabled from work. Personal care, sexual activity and sleeping are not grossly affected, and the patient can usually be managed by conservative means  41-60% Severe Disability Pain remains the main problem in this group, but activities of daily living are affected. These patients require a detailed investigation  61-80% Crippled Back pain impinges on all aspects of the patient's life. Positive intervention is required  81-100% Bed-bound These patients are either bed-bound or exaggerating their symptoms  Bluford FORBES Zoe DELENA Karon DELENA, et al. Surgery versus conservative management of stable thoracolumbar fracture: the PRESTO feasibility RCT. [place] ([place]): [employer]; 2021 Nov. [facility] Technology Assessment, No. 25.62.) Appendix 3, Oswestry Disability Index category descriptors. Available from: [employer]cz  Minimally Clinically Important Difference (MCID) = 12.8%    MUSCLE LENGTH: Hamstrings: Right *** deg; Left *** deg Thomas test: Right *** deg; Left *** deg  POSTURE: {posture:25561}  PALPATION: ***  LUMBAR ROM:   AROM eval  Flexion   Extension  Right lateral flexion   Left lateral flexion   Right rotation   Left rotation    (Blank rows = not tested)  LOWER EXTREMITY ROM:     {AROM/PROM:27142}  Right eval Left eval  Hip flexion    Hip extension    Hip abduction    Hip adduction    Hip internal rotation    Hip external rotation    Knee flexion    Knee extension    Ankle dorsiflexion    Ankle plantarflexion    Ankle inversion    Ankle eversion     (Blank rows = not tested)  LOWER EXTREMITY  MMT:    MMT Right eval Left eval  Hip flexion    Hip extension    Hip abduction    Hip adduction    Hip internal rotation    Hip external rotation    Knee flexion    Knee extension    Ankle dorsiflexion    Ankle plantarflexion    Ankle inversion    Ankle eversion     (Blank rows = not tested)  LUMBAR SPECIAL TESTS:  Straight leg raise test: {pos/neg:25243} and Slump test: {pos/neg:25243}  LOWER EXTREMITY SPECIAL TESTS:  Ankle special tests: {ANKLE SPECIAL TESTS:26241}   FUNCTIONAL TESTS:  30 seconds chair stand test  GAIT: Distance walked: 87ftx2 Assistive device utilized: None Level of assistance: Complete Independence Comments: ***  TREATMENT:                                                                                                                            [facility] Adult PT Treatment:                                                DATE: *** Eval and HEP Self Care: Additional minutes spent for educating on updated Therapeutic Home Exercise Program as well as comparing current status to condition at start of symptoms. This included exercises focusing on stretching, strengthening, with focus on eccentric aspects. Long term goals include an improvement in range of motion, strength, endurance as well as avoiding reinjury. Patient's frequency would include in 1-2 times a day, 3-5 times a week for a duration of 6-12 weeks. Proper technique shown and discussed handout in great detail. All questions were discussed and addressed.      PATIENT EDUCATION:  Education details: Discussed eval findings, rehab rationale and POC and patient is in agreement  Person educated: Patient Education method: Explanation and Handouts Education comprehension: verbalized understanding and needs further education  HOME EXERCISE PROGRAM: ***  ASSESSMENT:  CLINICAL IMPRESSION: Patient is a 43 y.o. female who was seen today for physical therapy evaluation and treatment for ***.   OBJECTIVE  IMPAIRMENTS: {opptimpairments:25111}.   ACTIVITY LIMITATIONS: {activitylimitations:27494}  PERSONAL FACTORS: {Personal factors:25162} are also affecting patient's functional outcome.  REHAB POTENTIAL: Fair due to chronicity and body habitus  CLINICAL DECISION MAKING: Evolving/moderate complexity  EVALUATION COMPLEXITY: Moderate   GOALS: Goals reviewed with patient? No  SHORT TERM GOALS: Target date: ***  .hep Baseline: Goal status: INITIAL  2.  *** Baseline:  Goal status: INITIAL  3.  *** Baseline:  Goal status: INITIAL  4.  *** Baseline:  Goal status: INITIAL  5.  *** Baseline:  Goal status: INITIAL  6.  *** Baseline:  Goal status: INITIAL  LONG TERM GOALS: Target date: ***  Patient will increase 30s chair stand reps from *** to *** with/without arms to demonstrate and improved functional ability with less pain/difficulty as well as reduce fall risk.  Baseline:  Goal status: INITIAL  2.  Patient will score at least ***% on FOTO to signify clinically meaningful improvement in functional abilities.   Baseline:  Goal status: INITIAL  3.  Patient will acknowledge ***/10 pain at least once during episode of care   Baseline:  Goal status: INITIAL  4.  *** Baseline:  Goal status: INITIAL  5.  *** Baseline:  Goal status: INITIAL  6.  *** Baseline:  Goal status: INITIAL  PLAN:  PT FREQUENCY: 1-2x/week  PT DURATION: 6 weeks  PLANNED INTERVENTIONS: 97110-Therapeutic exercises, 97530- Therapeutic activity, [id number]- Neuromuscular re-education, 97535- Self Care, 02859- Manual therapy, [id number]- Gait training, Patient/Family education, and Balance training.  PLAN FOR NEXT SESSION: HEP review and update, manual techniques as appropriate, aerobic tasks, ROM and flexibility activities, strengthening and PREs, TPDN, gait and balance training as needed     Reyes CHRISTELLA Kohut, PT 09/18/2024, 9:41 AM

## 2024-09-19 LAB — GASTROINTESTINAL PATHOGEN PNL
CampyloBacter Group: NOT DETECTED
Norovirus GI/GII: NOT DETECTED
Rotavirus A: NOT DETECTED
Salmonella species: NOT DETECTED
Shiga Toxin 1: NOT DETECTED
Shiga Toxin 2: NOT DETECTED
Shigella Species: NOT DETECTED
Vibrio Group: NOT DETECTED
Yersinia enterocolitica: NOT DETECTED

## 2024-09-21 ENCOUNTER — Other Ambulatory Visit (HOSPITAL_COMMUNITY): Payer: Self-pay

## 2024-09-21 MED ORDER — XIFAXAN 550 MG PO TABS
ORAL_TABLET | ORAL | 0 refills | Status: DC
  Filled 2024-09-27 (×2): qty 28, 14d supply, fill #0

## 2024-09-23 ENCOUNTER — Other Ambulatory Visit (HOSPITAL_COMMUNITY): Payer: Self-pay

## 2024-09-25 ENCOUNTER — Ambulatory Visit (INDEPENDENT_AMBULATORY_CARE_PROVIDER_SITE_OTHER): Admitting: Psychology

## 2024-09-25 DIAGNOSIS — F428 Other obsessive-compulsive disorder: Secondary | ICD-10-CM

## 2024-09-25 DIAGNOSIS — F4310 Post-traumatic stress disorder, unspecified: Secondary | ICD-10-CM | POA: Diagnosis not present

## 2024-09-25 DIAGNOSIS — F331 Major depressive disorder, recurrent, moderate: Secondary | ICD-10-CM | POA: Diagnosis not present

## 2024-09-25 DIAGNOSIS — F422 Mixed obsessional thoughts and acts: Secondary | ICD-10-CM | POA: Diagnosis not present

## 2024-09-25 DIAGNOSIS — F50819 Binge eating disorder, unspecified: Secondary | ICD-10-CM | POA: Diagnosis not present

## 2024-09-25 NOTE — Progress Notes (Unsigned)
                edge,  experiencing concentration difficulties, having trouble falling or staying asleep, exhibiting a general  state of irritability).: No Description Entered (Status: improved). Motor tension (e.g., restlessness,  tiredness, shakiness, muscle tension).: No Description Entered (Status: improved).  Problems Addressed  Anxiety, Phase Of Life Problems, Anxiety  Goals 1. Learn and implement coping skills that result in a reduction of anxiety  and worry, and improved daily functioning. Objective Learn  and implement calming skills to reduce overall anxiety and manage anxiety symptoms. Target Date: 2025-08-09Frequency: Weekly Progress: 40 Modality: individual  Related Interventions 1. Teach the client calming/relaxation skills (e.g., applied relaxation, progressive muscle  relaxation, cue controlled relaxation; mindful breathing; biofeedback) and how to discriminate  better between relaxation and tension; teach the client how to apply these skills to his/her daily  life (e.g., New Directions in Progressive Muscle Relaxation by Marcelyn Ditty, and  Hazlett-Stevens; Treating Generalized Anxiety Disorder by Rygh and Ida Rogue). Objective Identify, challenge, and replace biased, fearful self-talk with positive, realistic, and empowering selftalk. Target Date: 2024-07-06 Frequency: weekly Progress: 30 Modality: individual Related Interventions 1. Explore the client's schema and self-talk that mediate his/her fear response; assist him/her in  challenging the biases; replace the distorted messages with reality-based alternatives and  positive, realistic self-talk that will increase his/her self-confidence in coping with irrational  fears (see Cognitive Therapy of Anxiety Disorders by Laurence Slate). Objective Learn and implement problem-solving strategies for realistically addressing worries. Target Date: 2025-08-09Frequency: weekly Progress: 40 Modality: individual 2. Resolve conflicted feelings and adapt to the new life circumstances. Objective Apply problem-solving skills to current circumstances. Target Date: 2024-07-06 Frequency: weekly Progress: 20 Modality: individual Related Interventions 1. Teach the client problem-resolution skills (e.g., defining the problem clearly, brainstorming  multiple solutions, listing the pros and cons of each solution, seeking input from others,  selecting and implementing a plan of action, evaluating outcome, and readjusting plan as   necessary).   3. Stabilize anxiety level while increasing ability to function on a daily  basis. 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  The patient approved this plan.   Deonna Krummel G Ethridge Sollenberger, LCSW

## 2024-09-27 ENCOUNTER — Other Ambulatory Visit (HOSPITAL_COMMUNITY): Payer: Self-pay

## 2024-09-30 ENCOUNTER — Ambulatory Visit (INDEPENDENT_AMBULATORY_CARE_PROVIDER_SITE_OTHER): Admitting: Family Medicine

## 2024-09-30 VITALS — BP 101/68 | HR 58 | Temp 98.0°F | Ht 63.0 in | Wt 303.0 lb

## 2024-09-30 DIAGNOSIS — Z6841 Body Mass Index (BMI) 40.0 and over, adult: Secondary | ICD-10-CM | POA: Diagnosis not present

## 2024-09-30 DIAGNOSIS — K909 Intestinal malabsorption, unspecified: Secondary | ICD-10-CM

## 2024-09-30 DIAGNOSIS — Z9884 Bariatric surgery status: Secondary | ICD-10-CM

## 2024-09-30 NOTE — Progress Notes (Signed)
   SUBJECTIVE:  Chief Complaint: Obesity  Interim History: Patient here for first follow up in last 3 months. She is post op 4 months.  She is experiencing significant GI issues possible steatorrhea.  She is also experiencing incontinent episodes.  She had stool samples and mentioned her elastase was fine.  Her vitamin levels were low.  Can't figure out any association of food and events she is having.    Alexandra Beck is here to discuss her progress with her obesity treatment plan. She is on the keeping a food journal and adhering to recommended goals of 80 grams of protein and states she is following her eating plan approximately 75 % of the time. She states she is not exercising.   OBJECTIVE: Visit Diagnoses: Problem List Items Addressed This Visit   None   Vitals Temp: 98 F (36.7 C) BP: 101/68 Pulse Rate: (!) 58 SpO2: 95 %   Anthropometric Measurements Height: 5' 3 (1.6 m) Weight: (!) 303 lb (137.4 kg) BMI (Calculated): 53.69 Weight at Last Visit: 325 lb Weight Lost Since Last Visit: 22 Weight Gained Since Last Visit: 0 Starting Weight: 468 lb Total Weight Loss (lbs): 165 lb (74.8 kg)   Body Composition  Body Fat %: 63.7 % Fat Mass (lbs): 193.2 lbs Muscle Mass (lbs): 104.6 lbs Visceral Fat Rating : 24   Other Clinical Data Today's Visit #: 19 Starting Date: 04/17/18 Comments: 80 g     ASSESSMENT AND PLAN: Assessment & Plan Steatorrhea Patient describing fatty liquid stools.  This is not related to food choice.  Will refer to GI for further evaluation. S/P gastric bypass Still has significant restriction in terms of intake.  Has consistent follow up still with bariatric surgery.  Continue to follow along and review surgery notes at next appointment Morbid obesity (HCC)  BMI 50.0-59.9, adult (HCC)    Diet: Alexandra Beck is currently in the action stage of change. As such, her goal is to continue with weight loss efforts and has agreed to practicing portion control  and making smarter food choices, such as increasing vegetables and decreasing simple carbohydrates.   Exercise:  For substantial health benefits, adults should do at least 150 minutes (2 hours and 30 minutes) a week of moderate-intensity, or 75 minutes (1 hour and 15 minutes) a week of vigorous-intensity aerobic physical activity, or an equivalent combination of moderate- and vigorous-intensity aerobic activity. Aerobic activity should be performed in episodes of at least 10 minutes, and preferably, it should be spread throughout the week.  Behavior Modification:  We discussed the following Behavioral Modification Strategies today: increasing lean protein intake, no skipping meals, meal planning and cooking strategies, and planning for success.   No follow-ups on file.   She was informed of the importance of frequent follow up visits to maximize her success with intensive lifestyle modifications for her multiple health conditions.  Attestation Statements:   Reviewed by clinician on day of visit: allergies, medications, problem list, medical history, surgical history, family history, social history, and previous encounter notes.    Alexandra Cho, MD

## 2024-10-02 ENCOUNTER — Other Ambulatory Visit (HOSPITAL_BASED_OUTPATIENT_CLINIC_OR_DEPARTMENT_OTHER): Payer: Self-pay

## 2024-10-02 ENCOUNTER — Ambulatory Visit: Admitting: Psychology

## 2024-10-02 DIAGNOSIS — F428 Other obsessive-compulsive disorder: Secondary | ICD-10-CM | POA: Diagnosis not present

## 2024-10-02 DIAGNOSIS — F50819 Binge eating disorder, unspecified: Secondary | ICD-10-CM

## 2024-10-02 DIAGNOSIS — F4310 Post-traumatic stress disorder, unspecified: Secondary | ICD-10-CM

## 2024-10-02 DIAGNOSIS — F331 Major depressive disorder, recurrent, moderate: Secondary | ICD-10-CM | POA: Diagnosis not present

## 2024-10-02 NOTE — Progress Notes (Unsigned)
 [facility] Counselor/Therapist Progress Note  Patient ID: LLOYD CULLINAN, MRN: [id number],    Date: 10/02/2024  Time spent: 55 minutes  Time In:  1:06 Time out:2:01  Treatment Type: Individual Therapy  Reported Symptoms: sadness,   Mental Status Exam: Appearance:  Casual     Behavior: Appropriate  Motor: Normal  Speech/Language:  Normal Rate  Affect: blunted  Mood: pleasant  Thought process: normal  Thought content:   WNL  Sensory/Perceptual disturbances:   WNL  Orientation: oriented to person, place, time/date, and situation  Attention: Good  Concentration: Good  Memory: WNL  Fund of knowledge:  Good  Insight:   Good  Judgment:  Good  Impulse Control: Good   Risk Assessment: Danger to Self:  No Self-injurious Behavior: No Danger to Others: No Duty to Warn:no Physical Aggression / Violence:No  Access to Firearms a concern: No  Gang Involvement:No   Subjective: The patient attended a face-to-face individual therapy session via video visit today.  The patient gave verbal consent for the session to be on caregility.  The patient is aware of the limitations of telehealth and she was in her home alone and the therapist was in the office.  The patient presents as pleasant and cooperative.  The patient reports that she went to see her healthy weight and wellness doctor and talked about what is happening with her bowels and it seems that her doctor said that it was not dumping.  She did report that she changed some of her food earlier this week and she has not had as much of a problem with her gut.  At this point she seems like she does not really want to do the EMDR but we will talk about it further and if she wants to do it we will do it if not we will just continue to do talk therapy until I retire.  The patient does know that she is reactive and reports that she has been reactive in a way that has been self-deprecating.  We will address this a little bit further the  next time I see her.  Interventions: Cognitive Behavioral Therapy, Assertiveness/Communication, and Problem solving, psycho education  Diagnosis:Major depressive disorder, recurrent episode, moderate (HCC)  Obsessional thoughts  PTSD (post-traumatic stress disorder)  Binge eating disorder, unspecified severity  Plan: Treatment Plan  Strengths/Abilities:  Intelligent, ability for insight, supportive wife  Treatment Preferences:  Outpatient Individual therapy  Statement of Needs:  I Need help with my depression and motivation.   Symptoms:Depressed or irritable mood.:(Status: improved). Diminished interest in or  enjoyment of activities.(Status: improved). Feelings of hopelessness,  worthlessness, or inappropriate guilt.: (Status: improved). History of chronic or recurrent depression for which the client has taken antidepressant medication, been hospitalized, had  outpatient treatment, or had a course of electroconvulsive therapy.(Status:  maintained). Lack of energy.: (Status: maintained). Low self-esteem.:  (Status: improved). Poor concentration and indecisiveness.:  (Status: improved). Social withdrawal.:  (Status: maintained).  Unresolved grief issues.: (Status: maintained).  Problems Addressed:  Unipolar Depression  Goals:  LTG:1. Alleviate depressive symptoms and return to previous level of effective  functioning.  70% 2. Appropriately grieve the loss in order to normalize mood and to return  to previously adaptive level of functioning. 3. Develop healthy interpersonal relationships that lead to the alleviation  and help prevent the relapse of depression.  70% 4. Develop healthy thinking patterns and beliefs about self, others, and the world that lead to the alleviation and help prevent the relapse of  Depression  70% STG:1.Describe current and past experiences with depression including their impact on functioning and  attempts to resolve it.  70 % 2. Identify and  replace thoughts and beliefs that support depression.  70 % 3.Learn and implement behavioral strategies to overcome depression. 70% 4.Verbalize an understanding of healthy and unhealthy emotions with the intent of increasing the use of  healthy emotions to guide actions.  50 %  Target Date:  10/07/2025 Frequency: Bi Weekly Modality:Individual Interventions by Therapist:  CBT, problem solving, EMDR, insight oriented  Patient approved Treatment Plan  Eswin Worrell G Dylan Ruotolo, LCSW                                                                                                                   Taesean Reth G Junah Yam, LCSW

## 2024-10-03 ENCOUNTER — Ambulatory Visit

## 2024-10-06 ENCOUNTER — Encounter: Payer: Self-pay | Admitting: Internal Medicine

## 2024-10-06 DIAGNOSIS — E039 Hypothyroidism, unspecified: Secondary | ICD-10-CM

## 2024-10-07 ENCOUNTER — Other Ambulatory Visit (HOSPITAL_BASED_OUTPATIENT_CLINIC_OR_DEPARTMENT_OTHER): Payer: Self-pay

## 2024-10-07 ENCOUNTER — Other Ambulatory Visit: Payer: Self-pay

## 2024-10-07 MED ORDER — TRIAMCINOLONE ACETONIDE 0.1 % EX CREA
1.0000 | TOPICAL_CREAM | Freq: Two times a day (BID) | CUTANEOUS | 1 refills | Status: AC
  Filled 2024-10-07: qty 30, 15d supply, fill #0
  Filled 2024-10-21: qty 30, 15d supply, fill #1

## 2024-10-07 MED ORDER — ROSUVASTATIN CALCIUM 20 MG PO TABS
20.0000 mg | ORAL_TABLET | Freq: Every day | ORAL | 3 refills | Status: AC
  Filled 2024-10-07: qty 90, 90d supply, fill #0

## 2024-10-07 MED ORDER — LEVOTHYROXINE SODIUM 150 MCG PO TABS
150.0000 ug | ORAL_TABLET | Freq: Every day | ORAL | 3 refills | Status: AC
  Filled 2024-10-07: qty 90, 90d supply, fill #0

## 2024-10-09 ENCOUNTER — Encounter (INDEPENDENT_AMBULATORY_CARE_PROVIDER_SITE_OTHER): Payer: Self-pay | Admitting: Family Medicine

## 2024-10-09 ENCOUNTER — Ambulatory Visit (INDEPENDENT_AMBULATORY_CARE_PROVIDER_SITE_OTHER): Admitting: Psychology

## 2024-10-09 DIAGNOSIS — F428 Other obsessive-compulsive disorder: Secondary | ICD-10-CM

## 2024-10-09 DIAGNOSIS — F50819 Binge eating disorder, unspecified: Secondary | ICD-10-CM

## 2024-10-09 DIAGNOSIS — F3341 Major depressive disorder, recurrent, in partial remission: Secondary | ICD-10-CM | POA: Diagnosis not present

## 2024-10-09 DIAGNOSIS — F4310 Post-traumatic stress disorder, unspecified: Secondary | ICD-10-CM

## 2024-10-09 NOTE — Progress Notes (Signed)
 [facility] Counselor/Therapist Progress Note  Patient ID: Alexandra Beck, MRN: [id number],    Date: 10/09/2024  Time spent: 57 minutes  Time In:  1:04 Time out:2:01  Treatment Type: Individual Therapy  Reported Symptoms: sadness,   Mental Status Exam: Appearance:  Casual     Behavior: Appropriate  Motor: Normal  Speech/Language:  Normal Rate  Affect: blunted  Mood: pleasant  Thought process: normal  Thought content:   WNL  Sensory/Perceptual disturbances:   WNL  Orientation: oriented to person, place, time/date, and situation  Attention: Good  Concentration: Good  Memory: WNL  Fund of knowledge:  Good  Insight:   Good  Judgment:  Good  Impulse Control: Good   Risk Assessment: Danger to Self:  No Self-injurious Behavior: No Danger to Others: No Duty to Warn:no Physical Aggression / Violence:No  Access to Firearms a concern: No  Gang Involvement:No   Subjective: The patient attended a face-to-face individual therapy session via video visit today.  The patient gave verbal consent for the session to be on caregility.  The patient is aware of the limitations of telehealth and she was in her home alone and the therapist was in the office.  The patient presents as pleasant and cooperative.  The patient reports that she did go down to below 300 with her weight this week and she seems very pleased about that.  She talked today about what she is doing to take care of herself and she seems to be doing a good job with that.  We talked about issues with movement and exercise and I recommended that she talk to her doctor about possibly getting some physical therapy to just to help her improve her core so that it makes it easier for her to to move and that would be getting some physical exercise.  She is not likely to increase movement on her own at this point in time and I think that that would help her relieve some of her stress if she could do that.  I am going to await  Flora-Q from her as to whether she really wants to do EMDR since she has decided she wants to do virtual sessions again.  I am getting this sense that she is not totally sold on trying to work through issues with that technique so I will wait for her to more questions about that.   Interventions: Cognitive Behavioral Therapy, Assertiveness/Communication, and Problem solving, psycho education  Diagnosis:Obsessional thoughts  PTSD (post-traumatic stress disorder)  Binge eating disorder, unspecified severity  Major depressive disorder, recurrent episode, in partial remission  Plan: Treatment Plan  Strengths/Abilities:  Intelligent, ability for insight, supportive wife  Treatment Preferences:  Outpatient Individual therapy  Statement of Needs:  I Need help with my depression and motivation.   Symptoms:Depressed or irritable mood.:(Status: improved). Diminished interest in or  enjoyment of activities.(Status: improved). Feelings of hopelessness,  worthlessness, or inappropriate guilt.: (Status: improved). History of chronic or recurrent depression for which the client has taken antidepressant medication, been hospitalized, had  outpatient treatment, or had a course of electroconvulsive therapy.(Status:  maintained). Lack of energy.: (Status: maintained). Low self-esteem.:  (Status: improved). Poor concentration and indecisiveness.:  (Status: improved). Social withdrawal.:  (Status: maintained).  Unresolved grief issues.: (Status: maintained).  Problems Addressed:  Unipolar Depression  Goals:  LTG:1. Alleviate depressive symptoms and return to previous level of effective  functioning.  70% 2. Appropriately grieve the loss in order to normalize mood and to return  to  previously adaptive level of functioning. 3. Develop healthy interpersonal relationships that lead to the alleviation  and help prevent the relapse of depression.  70% 4. Develop healthy thinking patterns and beliefs  about self, others, and the world that lead to the alleviation and help prevent the relapse of  Depression  70% STG:1.Describe current and past experiences with depression including their impact on functioning and  attempts to resolve it.  70 % 2. Identify and replace thoughts and beliefs that support depression.  70 % 3.Learn and implement behavioral strategies to overcome depression. 70% 4.Verbalize an understanding of healthy and unhealthy emotions with the intent of increasing the use of  healthy emotions to guide actions.  50 %  Target Date:  10/07/2025 Frequency: Bi Weekly Modality:Individual Interventions by Therapist:  CBT, problem solving, EMDR, insight oriented  Patient approved Treatment Plan  Alexandra Maher G Glenette Bookwalter, LCSW                                                                                                                                  Lida Berkery G Daesia Zylka, LCSW

## 2024-10-09 NOTE — Telephone Encounter (Signed)
**Note De-identified  [place] Obfuscation** Please advise 

## 2024-10-15 ENCOUNTER — Other Ambulatory Visit (HOSPITAL_COMMUNITY): Payer: Self-pay

## 2024-10-15 ENCOUNTER — Other Ambulatory Visit: Payer: Self-pay | Admitting: Primary Care

## 2024-10-15 NOTE — Telephone Encounter (Signed)
 Pt requesting refill of controlled medication. Please advise, thank you!  LOV: 02/27/24 Last Fill: 08/26/24

## 2024-10-16 ENCOUNTER — Ambulatory Visit (INDEPENDENT_AMBULATORY_CARE_PROVIDER_SITE_OTHER): Admitting: Psychology

## 2024-10-16 ENCOUNTER — Other Ambulatory Visit: Payer: Self-pay

## 2024-10-16 ENCOUNTER — Other Ambulatory Visit (HOSPITAL_COMMUNITY): Payer: Self-pay

## 2024-10-16 DIAGNOSIS — F4310 Post-traumatic stress disorder, unspecified: Secondary | ICD-10-CM

## 2024-10-16 DIAGNOSIS — F50819 Binge eating disorder, unspecified: Secondary | ICD-10-CM

## 2024-10-16 DIAGNOSIS — F428 Other obsessive-compulsive disorder: Secondary | ICD-10-CM

## 2024-10-16 MED ORDER — OMEPRAZOLE 20 MG PO CPDR
20.0000 mg | DELAYED_RELEASE_CAPSULE | Freq: Every day | ORAL | 0 refills | Status: AC
  Filled 2024-10-16: qty 90, 90d supply, fill #0

## 2024-10-16 MED ORDER — MODAFINIL 200 MG PO TABS
200.0000 mg | ORAL_TABLET | Freq: Every day | ORAL | 3 refills | Status: AC
  Filled 2024-10-16: qty 30, 30d supply, fill #0
  Filled 2024-10-24 – 2024-12-16 (×2): qty 30, 30d supply, fill #1

## 2024-10-16 NOTE — Progress Notes (Addendum)
 [facility] Counselor/Therapist Progress Note  Patient ID: Alexandra Beck, MRN: [id number],    Date: 10/26/2024  Time spent: 55 minutes  Time In:  1:06 Time out:2:01  Treatment Type: Individual Therapy  Reported Symptoms: sadness,   Mental Status Exam: Appearance:  Casual     Behavior: Appropriate  Motor: Normal  Speech/Language:  Normal Rate  Affect: blunted  Mood: pleasant  Thought process: normal  Thought content:   WNL  Sensory/Perceptual disturbances:   WNL  Orientation: oriented to person, place, time/date, and situation  Attention: Good  Concentration: Good  Memory: WNL  Fund of knowledge:  Good  Insight:   Good  Judgment:  Good  Impulse Control: Good   Risk Assessment: Danger to Self:  No Self-injurious Behavior: No Danger to Others: No Duty to Warn:no Physical Aggression / Violence:No  Access to Firearms a concern: No  Gang Involvement:No   Subjective: The patient attended a face-to-face individual therapy session via video visit today.  The patient gave verbal consent for the session to be on caregility.  The patient is aware of the limitations of telehealth and she was in her home alone and the therapist was in the office.  The patient presents as pleasant and cooperative.  Today we talked about expectations and reality.  And I was explaining to her that pain is the difference between the expectation and the reality.  And she became a little argumentative when we were discussing this.  I think her point is that her baseline is below reality as far as her expectations of herself and she does not believe that reality is the place that she needs to be and perceived it as I was saying that she did not need to strive for goals.  We talked about this and I explained to her that know you are supposed to have goals that she work toward but you also have to just start at a certain place in order to be able to maintain contentment as opposed to happiness all the  time.  The patient did get around to saying that may be part of her argumentative nature today was about her not wanting to let go of her way of coping which is to take responsibility for things that that are not hers and beat herself up about it.  We will continue to have this discussion the next time I see her.  I do think that she is hesitant to do the EMDR about her self-deprecation and we need to have that conversation a little bit more moving forward.  Interventions: Cognitive Behavioral Therapy, Assertiveness/Communication, and Problem solving, psycho education  Diagnosis:Obsessional thoughts  PTSD (post-traumatic stress disorder)  Binge eating disorder, unspecified severity  Plan: Treatment Plan  Strengths/Abilities:  Intelligent, ability for insight, supportive wife  Treatment Preferences:  Outpatient Individual therapy  Statement of Needs:  I Need help with my depression and motivation.   Symptoms:Depressed or irritable mood.:(Status: improved). Diminished interest in or  enjoyment of activities.(Status: improved). Feelings of hopelessness,  worthlessness, or inappropriate guilt.: (Status: improved). History of chronic or recurrent depression for which the client has taken antidepressant medication, been hospitalized, had  outpatient treatment, or had a course of electroconvulsive therapy.(Status:  maintained). Lack of energy.: (Status: maintained). Low self-esteem.:  (Status: improved). Poor concentration and indecisiveness.:  (Status: improved). Social withdrawal.:  (Status: maintained).  Unresolved grief issues.: (Status: maintained).  Problems Addressed:  Unipolar Depression  Goals:  LTG:1. Alleviate depressive symptoms and return to previous level  of effective  functioning.  70% 2. Appropriately grieve the loss in order to normalize mood and to return  to previously adaptive level of functioning. 3. Develop healthy interpersonal relationships that lead to the  alleviation  and help prevent the relapse of depression.  70% 4. Develop healthy thinking patterns and beliefs about self, others, and the world that lead to the alleviation and help prevent the relapse of  Depression  70% STG:1.Describe current and past experiences with depression including their impact on functioning and  attempts to resolve it.  70 % 2. Identify and replace thoughts and beliefs that support depression.  70 % 3.Learn and implement behavioral strategies to overcome depression. 70% 4.Verbalize an understanding of healthy and unhealthy emotions with the intent of increasing the use of  healthy emotions to guide actions.  50 %  Target Date:  10/07/2025 Frequency: Bi Weekly Modality:Individual Interventions by Therapist:  CBT, problem solving, EMDR, insight oriented  Patient approved Treatment Plan  Alexandra Beck [name], LCSW

## 2024-10-21 ENCOUNTER — Other Ambulatory Visit (HOSPITAL_COMMUNITY): Payer: Self-pay

## 2024-10-21 NOTE — Discharge Instructions (Signed)

## 2024-10-21 NOTE — Telephone Encounter (Signed)
Please advise PT referral .

## 2024-10-22 ENCOUNTER — Ambulatory Visit
Admission: RE | Admit: 2024-10-22 | Discharge: 2024-10-22 | Disposition: A | Discharge status: Home / Self Care | Source: Ambulatory Visit | Attending: Sports Medicine | Admitting: Sports Medicine

## 2024-10-22 DIAGNOSIS — M51361 Other intervertebral disc degeneration, lumbar region with lower extremity pain only: Secondary | ICD-10-CM

## 2024-10-22 MED ORDER — METHYLPREDNISOLONE ACETATE 40 MG/ML INJ SUSP (RADIOLOG
80.0000 mg | Freq: Once | INTRAMUSCULAR | Status: AC
  Administered 2024-10-22: 80 mg via EPIDURAL

## 2024-10-22 MED ORDER — IOPAMIDOL (ISOVUE-M 200) INJECTION 41%
1.0000 mL | Freq: Once | INTRAMUSCULAR | Status: AC
  Administered 2024-10-22: 1 mL via EPIDURAL

## 2024-10-23 ENCOUNTER — Ambulatory Visit: Admitting: Psychology

## 2024-10-23 NOTE — Progress Notes (Unsigned)
 SABRA

## 2024-10-25 ENCOUNTER — Other Ambulatory Visit (HOSPITAL_COMMUNITY): Payer: Self-pay

## 2024-10-28 ENCOUNTER — Encounter (INDEPENDENT_AMBULATORY_CARE_PROVIDER_SITE_OTHER): Payer: Self-pay | Admitting: Family Medicine

## 2024-10-28 ENCOUNTER — Ambulatory Visit (INDEPENDENT_AMBULATORY_CARE_PROVIDER_SITE_OTHER): Admitting: Family Medicine

## 2024-10-28 ENCOUNTER — Other Ambulatory Visit (HOSPITAL_COMMUNITY): Payer: Self-pay

## 2024-10-28 VITALS — BP 119/68 | HR 52 | Temp 98.4°F | Ht 63.0 in | Wt 287.0 lb

## 2024-10-28 DIAGNOSIS — Z7985 Long-term (current) use of injectable non-insulin antidiabetic drugs: Secondary | ICD-10-CM

## 2024-10-28 DIAGNOSIS — R152 Fecal urgency: Secondary | ICD-10-CM | POA: Diagnosis not present

## 2024-10-28 DIAGNOSIS — E1165 Type 2 diabetes mellitus with hyperglycemia: Secondary | ICD-10-CM

## 2024-10-28 DIAGNOSIS — R159 Full incontinence of feces: Secondary | ICD-10-CM | POA: Diagnosis not present

## 2024-10-28 DIAGNOSIS — Z6841 Body Mass Index (BMI) 40.0 and over, adult: Secondary | ICD-10-CM

## 2024-10-28 DIAGNOSIS — R5381 Other malaise: Secondary | ICD-10-CM

## 2024-10-28 MED ORDER — OZEMPIC (0.25 OR 0.5 MG/DOSE) 2 MG/3ML ~~LOC~~ SOPN
0.2500 mg | PEN_INJECTOR | SUBCUTANEOUS | 0 refills | Status: AC
  Filled 2024-10-28: qty 3, 56d supply, fill #0

## 2024-10-28 NOTE — Progress Notes (Unsigned)
   SUBJECTIVE:  Chief Complaint: Obesity  Interim History: Patient here for follow up.  She is still having GI issues that are giving her anxiety about eating or leaving the house.  The consistency of her bowel movements have changed.  After her antibiotic course for SIBO she noticed an improvement but this has since reversed.  She doesn't think she is getting her total intake goal in daily.  Overall she doesn't want to eat much at any point.   Alexandra Beck is here to discuss her progress with her obesity treatment plan. She is on the practicing portion control and making smarter food choices, such as increasing vegetables and decreasing simple carbohydrates and states she is following her eating plan approximately 80 % of the time. She states she is not exercising.   OBJECTIVE: Visit Diagnoses: Problem List Items Addressed This Visit       Other   Morbid obesity (HCC)   Other Visit Diagnoses       Physical debility    -  Primary   Relevant Orders   Ambulatory referral to Physical Therapy     Incontinence of feces with fecal urgency       Relevant Orders   Ambulatory referral to Physical Therapy     BMI 50.0-59.9, adult (HCC)           Vitals Temp: 98.4 F (36.9 C) BP: 119/68 Pulse Rate: (!) 52 SpO2: 98 %   Anthropometric Measurements Height: 5' 3 (1.6 m) Weight: 287 lb (130.2 kg) BMI (Calculated): 50.85 Weight at Last Visit: 303 lb Weight Lost Since Last Visit: 16 lb Starting Weight: 468 lb Total Weight Loss (lbs): 181 lb (82.1 kg)   Body Composition  Body Fat %: 53.6 % Fat Mass (lbs): 154 lbs Muscle Mass (lbs): 126.4 lbs Visceral Fat Rating : 19   Other Clinical Data Today's Visit #: 68 Starting Date: 04/17/18 Comments: [facility]/[place]     ASSESSMENT AND PLAN: Assessment & Plan Physical debility  Incontinence of feces with fecal urgency  Type 2 diabetes mellitus with hyperglycemia, without long-term current use of insulin  (HCC) 9 clicks BMI 50.0-59.9, adult  (HCC)  Morbid obesity (HCC)    Diet: Jennfier is currently in the action stage of change. As such, her goal is to continue with weight loss efforts and has agreed to following a lower carbohydrate, vegetable and lean protein rich diet plan.   Exercise:  All adults should avoid inactivity. Some activity is better than none, and adults who participate in any amount of physical activity, gain some health benefits.  Behavior Modification:  We discussed the following Behavioral Modification Strategies today: increasing lean protein intake, decreasing simple carbohydrates, increasing vegetables, meal planning and cooking strategies, and planning for success.   No follow-ups on file.   She was informed of the importance of frequent follow up visits to maximize her success with intensive lifestyle modifications for her multiple health conditions.  Attestation Statements:   Reviewed by clinician on day of visit: allergies, medications, problem list, medical history, surgical history, family history, social history, and previous encounter notes.     [name], MD

## 2024-10-28 NOTE — Assessment & Plan Note (Signed)
 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  Will start at a microdose of 9 clicks on the Ozempic  0.25 mg pen to see if this assists in decreasing patient's bowel urgency.

## 2024-10-30 ENCOUNTER — Ambulatory Visit: Admitting: Psychology

## 2024-10-30 DIAGNOSIS — F3341 Major depressive disorder, recurrent, in partial remission: Secondary | ICD-10-CM

## 2024-10-30 DIAGNOSIS — F50819 Binge eating disorder, unspecified: Secondary | ICD-10-CM

## 2024-10-30 DIAGNOSIS — F428 Other obsessive-compulsive disorder: Secondary | ICD-10-CM

## 2024-10-30 DIAGNOSIS — F4310 Post-traumatic stress disorder, unspecified: Secondary | ICD-10-CM

## 2024-10-30 NOTE — Progress Notes (Unsigned)
                edge,  experiencing concentration difficulties, having trouble falling or staying asleep, exhibiting a general  state of irritability).: No Description Entered (Status: improved). Motor tension (e.g., restlessness,  tiredness, shakiness, muscle tension).: No Description Entered (Status: improved).  Problems Addressed  Anxiety, Phase Of Life Problems, Anxiety  Goals 1. Learn and implement coping skills that result in a reduction of anxiety  and worry, and improved daily functioning. Objective Learn  and implement calming skills to reduce overall anxiety and manage anxiety symptoms. Target Date: 2025-08-09Frequency: Weekly Progress: 40 Modality: individual  Related Interventions 1. Teach the client calming/relaxation skills (e.g., applied relaxation, progressive muscle  relaxation, cue controlled relaxation; mindful breathing; biofeedback) and how to discriminate  better between relaxation and tension; teach the client how to apply these skills to his/her daily  life (e.g., New Directions in Progressive Muscle Relaxation by Marcelyn Ditty, and  Hazlett-Stevens; Treating Generalized Anxiety Disorder by Rygh and Ida Rogue). Objective Identify, challenge, and replace biased, fearful self-talk with positive, realistic, and empowering selftalk. Target Date: 2024-07-06 Frequency: weekly Progress: 30 Modality: individual Related Interventions 1. Explore the client's schema and self-talk that mediate his/her fear response; assist him/her in  challenging the biases; replace the distorted messages with reality-based alternatives and  positive, realistic self-talk that will increase his/her self-confidence in coping with irrational  fears (see Cognitive Therapy of Anxiety Disorders by Laurence Slate). Objective Learn and implement problem-solving strategies for realistically addressing worries. Target Date: 2025-08-09Frequency: weekly Progress: 40 Modality: individual 2. Resolve conflicted feelings and adapt to the new life circumstances. Objective Apply problem-solving skills to current circumstances. Target Date: 2024-07-06 Frequency: weekly Progress: 20 Modality: individual Related Interventions 1. Teach the client problem-resolution skills (e.g., defining the problem clearly, brainstorming  multiple solutions, listing the pros and cons of each solution, seeking input from others,  selecting and implementing a plan of action, evaluating outcome, and readjusting plan as   necessary).   3. Stabilize anxiety level while increasing ability to function on a daily  basis. 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  The patient approved this plan.   Deonna Krummel G Ethridge Sollenberger, LCSW

## 2024-10-31 ENCOUNTER — Encounter: Payer: Self-pay | Admitting: Internal Medicine

## 2024-10-31 ENCOUNTER — Other Ambulatory Visit: Payer: Self-pay

## 2024-10-31 ENCOUNTER — Other Ambulatory Visit (HOSPITAL_COMMUNITY): Payer: Self-pay

## 2024-10-31 DIAGNOSIS — G4733 Obstructive sleep apnea (adult) (pediatric): Secondary | ICD-10-CM

## 2024-10-31 MED ORDER — PROMETHAZINE-DM 6.25-15 MG/5ML PO SYRP
5.0000 mL | ORAL_SOLUTION | Freq: Four times a day (QID) | ORAL | 0 refills | Status: DC | PRN
  Filled 2024-10-31: qty 118, 6d supply, fill #0

## 2024-11-01 ENCOUNTER — Other Ambulatory Visit: Payer: Self-pay

## 2024-11-01 DIAGNOSIS — G4733 Obstructive sleep apnea (adult) (pediatric): Secondary | ICD-10-CM

## 2024-11-01 NOTE — Telephone Encounter (Signed)
 Recommend getting split night sleep study in lab to re-assess OSA and oxygen need due to weight loss Please order. They are scheduling out to February I believe. For now continue to wear CPAP

## 2024-11-06 ENCOUNTER — Telehealth (HOSPITAL_COMMUNITY): Payer: Self-pay

## 2024-11-06 ENCOUNTER — Ambulatory Visit
Admission: RE | Admit: 2024-11-06 | Discharge: 2024-11-06 | Disposition: A | Discharge status: Home / Self Care | Source: Ambulatory Visit | Attending: Internal Medicine | Admitting: Internal Medicine

## 2024-11-06 ENCOUNTER — Other Ambulatory Visit: Payer: Self-pay | Admitting: Adult Health

## 2024-11-06 ENCOUNTER — Other Ambulatory Visit (HOSPITAL_COMMUNITY): Payer: Self-pay

## 2024-11-06 ENCOUNTER — Other Ambulatory Visit: Payer: Self-pay

## 2024-11-06 ENCOUNTER — Ambulatory Visit: Admitting: Psychology

## 2024-11-06 DIAGNOSIS — Z1231 Encounter for screening mammogram for malignant neoplasm of breast: Secondary | ICD-10-CM

## 2024-11-06 DIAGNOSIS — F331 Major depressive disorder, recurrent, moderate: Secondary | ICD-10-CM

## 2024-11-06 DIAGNOSIS — F50819 Binge eating disorder, unspecified: Secondary | ICD-10-CM

## 2024-11-06 DIAGNOSIS — F3341 Major depressive disorder, recurrent, in partial remission: Secondary | ICD-10-CM

## 2024-11-06 DIAGNOSIS — F4310 Post-traumatic stress disorder, unspecified: Secondary | ICD-10-CM

## 2024-11-06 DIAGNOSIS — F428 Other obsessive-compulsive disorder: Secondary | ICD-10-CM

## 2024-11-06 NOTE — Telephone Encounter (Signed)
 Past due for FU. Sent MyChart message.

## 2024-11-06 NOTE — Progress Notes (Signed)
 [facility] Counselor/Therapist Progress Note  Patient ID: Alexandra Beck, MRN: [id number],    Date: 11/06/2024  Time spent: 55 minutes  Time In:  1:07 Time out:2:02  Treatment Type: Individual Therapy  Reported Symptoms: sadness,   Mental Status Exam: Appearance:  Casual     Behavior: Appropriate  Motor: Normal  Speech/Language:  Normal Rate  Affect: blunted  Mood: pleasant  Thought process: normal  Thought content:   WNL  Sensory/Perceptual disturbances:   WNL  Orientation: oriented to person, place, time/date, and situation  Attention: Good  Concentration: Good  Memory: WNL  Fund of knowledge:  Good  Insight:   Good  Judgment:  Good  Impulse Control: Good   Risk Assessment: Danger to Self:  No Self-injurious Behavior: No Danger to Others: No Duty to Warn:no Physical Aggression / Violence:No  Access to Firearms a concern: No  Gang Involvement:No   Subjective: The patient attended a face-to-face individual therapy session via video visit today.  The patient gave verbal consent for the session to be on caregility.  The patient is aware of the limitations of telehealth and she was in her home alone and the therapist was in the office.  The patient presents as pleasant and cooperative.  We talked today about how the patient had a really good insight a few weeks ago in session and I gave her kudos for thinking outside the box and for pointing something very helpful to me out.  She seemed pleased that I had recognize that she was helpful in that way.  This led us  to a conversation about how she does the self-loathing and where that comes from.  We started talking about her history with her father and she is saying that she does not have a lot of memories about her childhood and I am not sure if this is because she does not want to do the EMDR or whether she truly does not have memories about that.  We may have to find current situations to target with EMDR.  I do  believe that her negative cognition is I am a failure.  We will continue to work on coming up with targets and I will give her the option of doing the EMDR online or of coming into the office.  She may or may not choose to do the EMDR but we will talk about that as we go.   Interventions: Cognitive Behavioral Therapy, Assertiveness/Communication, and Problem solving, psycho education  Diagnosis:Obsessional thoughts  PTSD (post-traumatic stress disorder)  Binge eating disorder, unspecified severity  Major depressive disorder, recurrent episode, in partial remission  Plan: Treatment Plan  Strengths/Abilities:  Intelligent, ability for insight, supportive wife  Treatment Preferences:  Outpatient Individual therapy  Statement of Needs:  I Need help with my depression and motivation.   Symptoms:Depressed or irritable mood.:(Status: improved). Diminished interest in or  enjoyment of activities.(Status: improved). Feelings of hopelessness,  worthlessness, or inappropriate guilt.: (Status: improved). History of chronic or recurrent depression for which the client has taken antidepressant medication, been hospitalized, had  outpatient treatment, or had a course of electroconvulsive therapy.(Status:  maintained). Lack of energy.: (Status: maintained). Low self-esteem.:  (Status: improved). Poor concentration and indecisiveness.:  (Status: improved). Social withdrawal.:  (Status: maintained).  Unresolved grief issues.: (Status: maintained).  Problems Addressed:  Unipolar Depression  Goals:  LTG:1. Alleviate depressive symptoms and return to previous level of effective  functioning.  70% 2. Appropriately grieve the loss in order to normalize mood and to  return  to previously adaptive level of functioning. 3. Develop healthy interpersonal relationships that lead to the alleviation  and help prevent the relapse of depression.  70% 4. Develop healthy thinking patterns and beliefs about  self, others, and the world that lead to the alleviation and help prevent the relapse of  Depression  70% STG:1.Describe current and past experiences with depression including their impact on functioning and  attempts to resolve it.  70 % 2. Identify and replace thoughts and beliefs that support depression.  70 % 3.Learn and implement behavioral strategies to overcome depression. 70% 4.Verbalize an understanding of healthy and unhealthy emotions with the intent of increasing the use of  healthy emotions to guide actions.  50 %  Target Date:  10/07/2025 Frequency: Bi Weekly Modality:Individual Interventions by Therapist:  CBT, problem solving, EMDR, insight oriented  Patient approved Treatment Plan  Melvinia Ashby [name], LCSW

## 2024-11-06 NOTE — Telephone Encounter (Signed)
 Medication: Modafinil  100mg   Able to fill? No Prior authorization required? Yes Co-pay before assistance: N/A

## 2024-11-07 ENCOUNTER — Other Ambulatory Visit: Payer: Self-pay

## 2024-11-07 ENCOUNTER — Other Ambulatory Visit (HOSPITAL_COMMUNITY): Payer: Self-pay

## 2024-11-08 ENCOUNTER — Telehealth: Payer: Self-pay

## 2024-11-08 ENCOUNTER — Other Ambulatory Visit: Payer: Self-pay

## 2024-11-08 ENCOUNTER — Other Ambulatory Visit (HOSPITAL_COMMUNITY): Payer: Self-pay

## 2024-11-08 MED ORDER — LAMOTRIGINE 200 MG PO TABS
200.0000 mg | ORAL_TABLET | Freq: Every day | ORAL | 0 refills | Status: DC
  Filled 2024-11-08: qty 30, 30d supply, fill #0

## 2024-11-08 MED ORDER — SERTRALINE HCL 100 MG PO TABS
200.0000 mg | ORAL_TABLET | Freq: Every day | ORAL | 0 refills | Status: DC
  Filled 2024-11-08: qty 60, 30d supply, fill #0

## 2024-11-08 NOTE — Telephone Encounter (Signed)
*  [facility]  Pharmacy Patient Advocate Encounter   Received notification from Pt Calls Messages that prior authorization for Modafinil  100 is required/requested.   Insurance verification completed.   The patient is insured through [facility].   Per test claim: PA required; PA submitted to above mentioned insurance via Latent Key/confirmation #/EOC [id number] Status is pending

## 2024-11-08 NOTE — Telephone Encounter (Signed)
 Pt made appt for 11/29/2024

## 2024-11-08 NOTE — Telephone Encounter (Signed)
 PA request has been Received. New Encounter has been or will be created for follow up. For additional info see Pharmacy Prior Auth telephone encounter from 12/12.

## 2024-11-08 NOTE — Telephone Encounter (Signed)
 Your request has been approved Your PA request has been approved. Additional information will be provided in the approval communication. (Message 1145) Authorization Expiration12/10/2025

## 2024-11-11 ENCOUNTER — Telehealth: Payer: Self-pay

## 2024-11-11 ENCOUNTER — Inpatient Hospital Stay

## 2024-11-11 ENCOUNTER — Inpatient Hospital Stay: Admitting: Adult Health

## 2024-11-11 NOTE — Telephone Encounter (Signed)
 Left message on voicemail about upcoming appointment on 12/16

## 2024-11-12 ENCOUNTER — Other Ambulatory Visit (HOSPITAL_COMMUNITY): Payer: Self-pay

## 2024-11-12 ENCOUNTER — Inpatient Hospital Stay: Admitting: Hematology and Oncology

## 2024-11-12 ENCOUNTER — Encounter: Payer: Self-pay | Admitting: Hematology and Oncology

## 2024-11-12 ENCOUNTER — Inpatient Hospital Stay: Attending: Hematology and Oncology

## 2024-11-12 VITALS — BP 119/56 | HR 67 | Temp 97.9°F | Resp 17 | Wt 286.7 lb

## 2024-11-12 DIAGNOSIS — D509 Iron deficiency anemia, unspecified: Secondary | ICD-10-CM | POA: Diagnosis present

## 2024-11-12 LAB — CBC WITH DIFFERENTIAL/PLATELET
Abs Immature Granulocytes: 0.02 K/uL (ref 0.00–0.07)
Basophils Absolute: 0 K/uL (ref 0.0–0.1)
Basophils Relative: 1 %
Eosinophils Absolute: 0.1 K/uL (ref 0.0–0.5)
Eosinophils Relative: 2 %
HCT: 31.9 % — ABNORMAL LOW (ref 36.0–46.0)
Hemoglobin: 9.8 g/dL — ABNORMAL LOW (ref 12.0–15.0)
Immature Granulocytes: 0 %
Lymphocytes Relative: 16 %
Lymphs Abs: 1.2 K/uL (ref 0.7–4.0)
MCH: 26.8 pg (ref 26.0–34.0)
MCHC: 30.7 g/dL (ref 30.0–36.0)
MCV: 87.4 fL (ref 80.0–100.0)
Monocytes Absolute: 0.5 K/uL (ref 0.1–1.0)
Monocytes Relative: 7 %
Neutro Abs: 5.7 K/uL (ref 1.7–7.7)
Neutrophils Relative %: 74 %
Platelets: 214 K/uL (ref 150–400)
RBC: 3.65 MIL/uL — ABNORMAL LOW (ref 3.87–5.11)
RDW: 17.4 % — ABNORMAL HIGH (ref 11.5–15.5)
WBC: 7.6 K/uL (ref 4.0–10.5)
nRBC: 0 % (ref 0.0–0.2)

## 2024-11-12 LAB — CMP (CANCER CENTER ONLY)
ALT: 17 U/L (ref 0–44)
AST: 18 U/L (ref 15–41)
Albumin: 3.8 g/dL (ref 3.5–5.0)
Alkaline Phosphatase: 107 U/L (ref 38–126)
Anion gap: 11 (ref 5–15)
BUN: 24 mg/dL — ABNORMAL HIGH (ref 6–20)
CO2: 27 mmol/L (ref 22–32)
Calcium: 9.2 mg/dL (ref 8.9–10.3)
Chloride: 103 mmol/L (ref 98–111)
Creatinine: 1.21 mg/dL — ABNORMAL HIGH (ref 0.44–1.00)
GFR, Estimated: 56 mL/min — ABNORMAL LOW (ref >60)
Glucose, Bld: 90 mg/dL (ref 70–99)
Potassium: 3.8 mmol/L (ref 3.5–5.1)
Sodium: 141 mmol/L (ref 135–145)
Total Bilirubin: 0.2 mg/dL (ref 0.0–1.2)
Total Protein: 7 g/dL (ref 6.5–8.1)

## 2024-11-12 LAB — IRON AND IRON BINDING CAPACITY (CC-WL,HP ONLY)
Iron: 24 ug/dL — ABNORMAL LOW (ref 28–170)
Saturation Ratios: 7 % — ABNORMAL LOW (ref 10.4–31.8)
TIBC: 351 ug/dL (ref 250–450)
UIBC: 328 ug/dL

## 2024-11-12 LAB — FERRITIN: Ferritin: 47 ng/mL (ref 11–307)

## 2024-11-12 NOTE — Progress Notes (Signed)
 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 Likely multifactorial etiology including impaired absorption and possible GI blood loss. Prior iron  infusions improved hemoglobin and symptoms. - Monitored ferritin and hemoglobin; pending today's lab results. - If ferritin is low and/or hemoglobin does not improve, will offer repeat iron  infusion with previously tolerated formulation. - If ferritin remains >100 and hemoglobin is stable, will defer iron  infusion and continue observation. - If ongoing need for iron  infusions is confirmed and no GI source is identified, will consider scheduled iron  infusions - Schedule follow-up in approximately 3 months, to be adjusted based on lab results.  Gastrointestinal blood loss, evaluation pending Intermittent hematochezia suggests possible lower GI bleeding. Menstruation and gastric bypass may contribute, GI evaluation pending with Dr. Lovella. - Instructed her to contact Dr. Rosealee office directly to expedite GI evaluation. - Follow up with bariatric surgeon after upcoming CT scan for additional GI symptoms unrelated to bleeding.     HISTORY OF PRESENTING ILLNESS:  Alexandra Beck 43 y.o. female is here because of anemia.  History of Present Illness    Discussed the use of AI scribe software for clinical note transcription with the patient, who gave verbal consent to proceed.  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  She has chronic iron  deficiency anemia with hemoglobin improving from a nadir of 7.2 to 9.8 today. Hemoglobin was 9.0 in October and has shown a gradual upward trend following iron  infusions, the most recent of which was administered on September 09, 2024. She has not received additional iron  infusions since then and is not currently taking oral iron  supplements. She previously required a blood transfusion during a hospitalization.  Symptoms include fatigue, which she attributes to multiple factors, and brittle nails, which she states have been lifelong. Pica symptoms, specifically ice craving, have improved since her last visit.  She describes intermittent, minimal hematochezia, occurring occasionally as a spot but not with every bowel movement. There is no melena. Menstruation continues but is light. She has ongoing gastrointestinal symptoms unrelated to bleeding and is planning to follow up with her bariatric surgeon after an upcoming CT scan.  Multiple referrals to gastroenterology have been made by her primary care provider and weight loss provider, with a specific request for evaluation by Dr. Sandor Lovella, but she has not yet been contacted for an appointment.  Current medications include semaglutide , recently restarted at a low dose. No other medication changes since the last visit.  MEDICAL HISTORY:  Past Medical History:  Diagnosis Date   Acute renal failure (ARF) 11/2012    multifactorial-likely secondary to ATN in the setting of sepsis and hypotension, and also from rhabdomyolysis   Anemia    Cellulitis    Chronic diastolic CHF (congestive heart failure) (HCC)    Depression    Diabetes mellitus (HCC)    GERD (gastroesophageal reflux disease)    Hyperlipidemia  Hypertension    Hypothyroidism    Morbid obesity  with BMI of 70 and over, adult (HCC)    Obesity hypoventilation syndrome (HCC)    OSA (obstructive sleep apnea)    PVC's (premature ventricular contractions)    Recurrent cellulitis of lower leg    LLE, venous insuff   Sepsis (HCC) 11/2012   Secondary to cellulitis   Sleep apnea    Venous insufficiency of leg     SURGICAL HISTORY: Past Surgical History:  Procedure Laterality Date   GASTRIC BYPASS  06/12/2024   IR FLUORO GUIDE CV MIDLINE PICC RIGHT  03/28/2017   IR US  GUIDE VASC ACCESS RIGHT  03/28/2017   WISDOM TOOTH EXTRACTION      SOCIAL HISTORY: Social History   Socioeconomic History   Marital status: Married    Spouse name: Not on file   Number of children: 0   Years of education: Not on file   Highest education level: Not on file  Occupational History   Occupation: Firefighter: [employer]  Tobacco Use   Smoking status: Former    Current packs/day: 0.00    Average packs/day: 0.5 packs/day for 8.0 years (4.0 ttl pk-yrs)    Types: Cigarettes    Start date: 2003    Quit date: 2011    Years since quitting: 14.9   Smokeless tobacco: Never  Vaping Use   Vaping status: Never Used  Substance and Sexual Activity   Alcohol use: No    Alcohol/week: 0.0 standard drinks of alcohol   Drug use: No   Sexual activity: Not Currently    Birth control/protection: None  Other Topics Concern   Not on file  Social History Narrative   Librarian, 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)   Social Drivers of Health   Tobacco Use: Medium Risk (10/28/2024)   Patient History    Smoking Tobacco Use: Former    Smokeless Tobacco Use: Never    Passive Exposure: Not on Actuary Strain: Low Risk (06/13/2024)   Received from [facility]   Overall Financial Resource Strain (CARDIA)    How hard is it for you to pay for the very basics like food, housing, medical care, and heating?: Not hard at all  Food Insecurity: No Food Insecurity (06/13/2024)    Received from [facility]   Epic    Within the past 12 months, you worried that your food would run out before you got the money to buy more.: Never true    Within the past 12 months, the food you bought just didn't last and you didn't have money to get more.: Never true  Transportation Needs: No Transportation Needs (06/13/2024)   Received from [facility]   PRAPARE - Transportation    Lack of Transportation (Medical): No    Lack of Transportation (Non-Medical): No  Physical Activity: Not on file  Stress: Not on file  Social Connections: Not on file  Intimate Partner Violence: Not At Risk (02/12/2024)   Humiliation, Afraid, Rape, and Kick questionnaire    Fear of Current or Ex-Partner: No    Emotionally Abused: No    Physically Abused: No    Sexually Abused: No  Depression (PHQ2-9): High Risk (09/13/2024)   Depression (PHQ2-9)    PHQ-2 Score: 12  Alcohol Screen: Not on file  Housing: Low Risk (02/12/2024)   Housing Stability Vital Sign    Unable to Pay for Housing in the Last  Year: No    Number of Times Moved in the Last Year: 0    Homeless in the Last Year: No  Utilities: Not At Risk (02/12/2024)   [facility] Utilities    Threatened with loss of utilities: No  Health Literacy: Not on file    FAMILY HISTORY: Family History  Problem Relation Age of Onset   Hypertension Mother    Diabetes Mother    High Cholesterol Mother    Thyroid  disease Mother    Sleep apnea Mother    Obesity Mother    Hypertension Father    Heart disease Father        before age 107   High Cholesterol Father    Sleep apnea Father    Obesity Father     ALLERGIES:  is allergic to aspirin, doxycycline, lisinopril, niacin, niacin and related, niaspan [niacin er (antihyperlipidemic)], wound dressing adhesive, and wound dressings.  MEDICATIONS:  Current Outpatient Medications  Medication Sig Dispense Refill   promethazine -dextromethorphan (PROMETHAZINE -DM) 6.25-15 MG/5ML syrup Take 5 mLs by mouth 4  (four) times daily as needed. 118 mL 0   cariprazine  (VRAYLAR ) 1.5 MG capsule Take 1 capsule (1.5 mg total) by mouth daily. 30 capsule 2   lamoTRIgine  (LAMICTAL ) 200 MG tablet Take 1 tablet (200 mg total) by mouth daily. 30 tablet 0   levothyroxine  (SYNTHROID ) 150 MCG tablet Take 1 tablet (150 mcg total) by mouth daily before breakfast. Must keep scheduled appt for future refills 90 tablet 3   metoprolol  succinate (TOPROL -XL) 50 MG 24 hr tablet Take 1 tablet (50 mg total) by mouth daily. Take with or immediately following a meal. 90 tablet 3   modafinil  (PROVIGIL ) 100 MG tablet Take 1 tablet (100 mg total) by mouth daily, along with 200mg  tablet. 30 tablet 3   modafinil  (PROVIGIL ) 200 MG tablet Take 1 tablet (200 mg total) by mouth daily at 9 A.M. 30 tablet 3   omeprazole  (PRILOSEC) 20 MG capsule Take 20 mg by mouth.     omeprazole  (PRILOSEC) 20 MG capsule Take 1 capsule (20 mg total) by mouth daily. Open capsule, granules will dispense in water or can be taken with a spoonful of greek yogurt or applesauce. 180 capsule 0   rosuvastatin  (CRESTOR ) 20 MG tablet Take 1 tablet (20 mg total) by mouth daily. 90 tablet 3   Semaglutide ,0.25 or 0.5MG /DOS, (OZEMPIC , 0.25 OR 0.5 MG/DOSE,) 2 MG/3ML SOPN Inject 0.25 mg into the skin once a week. 3 mL 0   sertraline  (ZOLOFT ) 100 MG tablet Take 2 tablets (200 mg total) by mouth daily. 60 tablet 0   spironolactone  (ALDACTONE ) 25 MG tablet Take 0.5 tablets (12.5 mg total) by mouth daily. 45 tablet 3   torsemide  (DEMADEX ) 20 MG tablet Take 4 tablets (80 mg total) by mouth 2 (two) times daily as needed. 720 tablet 2   triamcinolone  cream (KENALOG ) 0.1 % Apply 1 Application topically 2 (two) times daily. 30 g 1   Zinc 50 MG TABS Take 50 mg by mouth.     No current facility-administered medications for this visit.     PHYSICAL EXAMINATION: ECOG PERFORMANCE STATUS: 0 - Asymptomatic  Vitals:   11/12/24 1325  BP: (!) 119/56  Pulse: 67  Resp: 17  Temp: 97.9 F  (36.6 C)  SpO2: 97%   Filed Weights   11/12/24 1325  Weight: 286 lb 11.2 oz (130 kg)    GENERAL:alert, no distress and comfortable, morbidly obese. Appears pale.   LABORATORY DATA:  I have  reviewed the data as listed Lab Results  Component Value Date   WBC 7.6 11/12/2024   HGB 9.8 (L) 11/12/2024   HCT 31.9 (L) 11/12/2024   MCV 87.4 11/12/2024   PLT 214 11/12/2024     Chemistry      Component Value Date/Time   NA 142 09/16/2024 1149   NA 142 11/13/2023 1325   K 3.7 09/16/2024 1149   CL 102 09/16/2024 1149   CO2 33 (H) 09/16/2024 1149   BUN 21 (H) 09/16/2024 1149   BUN 20 11/13/2023 1325   CREATININE 1.07 (H) 09/16/2024 1149   CREATININE 1.06 11/14/2016 1050      Component Value Date/Time   CALCIUM  9.5 09/16/2024 1149   CALCIUM  9.6 06/26/2007 0000   ALKPHOS 94 09/16/2024 1149   AST 11 (L) 09/16/2024 1149   ALT 9 09/16/2024 1149   BILITOT 0.3 09/16/2024 1149       RADIOGRAPHIC STUDIES: I have personally reviewed the radiological images as listed and agreed with the findings in the report. DG INJECT DIAG/THERA/INC NEEDLE/CATH/PLC EPI/LUMB/SAC W/IMG Result Date: 10/22/2024 CLINICAL HISTORY: Low back and right lower extremity pain. Imaging demonstrates protrusion L5-S1. Right L5-S1. EXAM: LUMBAR EPIDURAL INJECTION COMPARISON: CT 02/06/2024 PROCEDURE: The procedure, risks, benefits, and alternatives were explained to the patient. Questions regarding the procedure were encouraged and answered. The patient understands and consents to the procedure. A right parasagittal approach was performed on the L5-S1. The overlying skin was cleansed and anesthetized. A 20 gauge epidural needle was advanced using loss-of-resistance technique. Injection of Isovue -M 200 shows a good epidural pattern with spread above and below the level of needle placement, primarily on the right. No vascular opacification is seen. 80 mg of Depo-Medrol  mixed with 2 ml lidocaine  1% were instilled. The  procedure was well-tolerated, and the patient was discharged thirty minutes following the injection in good condition. FLUOROSCOPY: Radiation Exposure Index (as provided by the fluoroscopic device): 9.2 mGy air Kerma COMPLICATIONS: None immediate IMPRESSION: 1. Technically successful right L5-S1 epidural injection. Electronically signed by: Dayne Hassell MD 10/22/2024 05:05 PM EST RP Workstation: [id number]     All questions were answered. The patient knows to call the clinic with any problems, questions or concerns. I spent 20 minutes in the care of this patient including H and P, review of records, counseling and coordination of care.     Amber Stalls, MD 11/12/2024 1:28 PM

## 2024-11-12 NOTE — Telephone Encounter (Signed)
 I called and spoke with patient, provided information regarding PA for Modafinil .  She verbalized understanding.  She asked if she could get that filled for a 90 day supply.  I told her I could call her pharmacy and check, if so we could change to 90 day supply, if not, we will leave it at 30 day supply.  She verbalized understanding.  I called [facility] outpatient pharmacy and let them know that the PA was approved for the Modafinil .  Her insurance will not cover a 90 day supply, however, the 30 day supply is covered.  Nothing further needed.

## 2024-11-13 ENCOUNTER — Ambulatory Visit: Admitting: Psychology

## 2024-11-13 ENCOUNTER — Telehealth: Payer: Self-pay | Admitting: Hematology and Oncology

## 2024-11-13 NOTE — Telephone Encounter (Signed)
 I left a voicemail for patient regarding 02/11/2025 appointment.

## 2024-11-15 ENCOUNTER — Encounter: Payer: Self-pay | Admitting: Physician Assistant

## 2024-11-15 ENCOUNTER — Encounter: Payer: Self-pay | Admitting: Internal Medicine

## 2024-11-15 MED ORDER — CLONAZEPAM 0.5 MG PO TABS
0.5000 mg | ORAL_TABLET | Freq: Two times a day (BID) | ORAL | 0 refills | Status: AC | PRN
  Filled 2024-11-15: qty 4, 2d supply, fill #0

## 2024-11-16 ENCOUNTER — Other Ambulatory Visit (HOSPITAL_COMMUNITY): Payer: Self-pay

## 2024-11-20 ENCOUNTER — Ambulatory Visit: Admitting: Psychology

## 2024-11-25 ENCOUNTER — Other Ambulatory Visit (HOSPITAL_COMMUNITY): Payer: Self-pay

## 2024-11-25 ENCOUNTER — Other Ambulatory Visit: Payer: Self-pay | Admitting: Adult Health

## 2024-11-25 DIAGNOSIS — F331 Major depressive disorder, recurrent, moderate: Secondary | ICD-10-CM

## 2024-11-25 MED ORDER — CARIPRAZINE HCL 1.5 MG PO CAPS
1.5000 mg | ORAL_CAPSULE | Freq: Every day | ORAL | 0 refills | Status: DC
  Filled 2024-11-25: qty 30, 30d supply, fill #0

## 2024-11-26 ENCOUNTER — Other Ambulatory Visit (HOSPITAL_COMMUNITY): Payer: Self-pay

## 2024-11-27 ENCOUNTER — Ambulatory Visit: Admitting: Psychology

## 2024-11-29 ENCOUNTER — Other Ambulatory Visit (HOSPITAL_COMMUNITY): Payer: Self-pay

## 2024-11-29 ENCOUNTER — Telehealth: Admitting: Adult Health

## 2024-11-29 ENCOUNTER — Other Ambulatory Visit: Payer: Self-pay

## 2024-11-29 ENCOUNTER — Encounter: Payer: Self-pay | Admitting: Adult Health

## 2024-11-29 DIAGNOSIS — F331 Major depressive disorder, recurrent, moderate: Secondary | ICD-10-CM

## 2024-11-29 DIAGNOSIS — F428 Other obsessive-compulsive disorder: Secondary | ICD-10-CM | POA: Diagnosis not present

## 2024-11-29 MED ORDER — SERTRALINE HCL 100 MG PO TABS
200.0000 mg | ORAL_TABLET | Freq: Every day | ORAL | 2 refills | Status: AC
  Filled 2024-11-29 – 2024-12-01 (×2): qty 60, 30d supply, fill #0

## 2024-11-29 MED ORDER — CARIPRAZINE HCL 3 MG PO CAPS
3.0000 mg | ORAL_CAPSULE | Freq: Every day | ORAL | 2 refills | Status: DC
  Filled 2024-11-29: qty 30, 30d supply, fill #0

## 2024-11-29 MED ORDER — LAMOTRIGINE 200 MG PO TABS
200.0000 mg | ORAL_TABLET | Freq: Every day | ORAL | 2 refills | Status: AC
  Filled 2024-11-29 – 2024-12-01 (×2): qty 30, 30d supply, fill #0

## 2024-11-29 NOTE — Progress Notes (Signed)
 Alexandra Beck [id number] 19-Jan-1981 44 y.o.  Virtual Visit via Video Note  I connected with pt @ on 11/29/2024 at  1:00 PM EST by a video enabled telemedicine application and verified that I am speaking with the correct person using two identifiers.   I discussed the limitations of evaluation and management by telemedicine and the availability of in person appointments. The patient expressed understanding and agreed to proceed.  I discussed the assessment and treatment plan with the patient. The patient was provided an opportunity to ask questions and all were answered. The patient agreed with the plan and demonstrated an understanding of the instructions.   The patient was advised to call back or seek an in-person evaluation if the symptoms worsen or if the condition fails to improve as anticipated.  I provided 25 minutes of non-face-to-face time during this encounter.  The patient was located at home.  The provider was located at [facility].   [name], NP   Subjective:   Patient ID:  Alexandra Beck is a 44 y.o. (DOB [birth year]) female.  Chief Complaint: No chief complaint on file.   HPI KIANDRA SANGUINETTI presents for follow-up of follow-up of obsessional thoughts and depression.  Describes mood today as not the best. Pleasant. Reports some tearfulness. Mood symptoms - reports depression and anxiety. Denies  irritability. Reports lower interest and motivation. Denies recent panic attack. Reports decreased worry, rumination, and over thinking. Denies obsessive  thoughts or acts. Reports some intrusive thoughts. Reports mood as mid to lower. Stating overall, I feel like I'm still adjusting. Feels like current medications are helpful, but would like to consider increasing the dose of Vraylar  1.5 to 3mg  daily. Seeing therapist - Bambi Cottle for therapy. Taking medications as prescribed. Energy levels lower. Active, does not have a regular exercise routine.  Enjoys some usual  interests and activities. Married. Lives with wife and 3 cats. Spending time with family. Gaming online with friends. Appetite adequate. Weight loss - 75 pounds since July, and 125 since last December. Sleeping well most nights. Averages 6 to 8 hours. Has an upcoming sleep study. Reports some focus and concentration difficulties. Completing tasks. Managing minimal aspects of household. Works 12 to 9 - at [employer].  Denies SI or HI.  Denies AH or VH. Denies self harm. Denies substance use. Seeing Bambi Cottle for therapy.  Previous medication trials: Trialed on Wellbutrin  at age 56, Trintellix -weird dreams, Latuda , Rexulti ,    Review of Systems:  Review of Systems  Musculoskeletal:  Negative for gait problem.  Neurological:  Negative for tremors.  Psychiatric/Behavioral:         Please refer to HPI    Medications: I have reviewed the patient's current medications.  Current Outpatient Medications  Medication Sig Dispense Refill   clonazePAM  (KLONOPIN ) 0.5 MG tablet Take 1 tablet (0.5 mg total) by mouth 2 (two) times daily as needed for anxiety. 4 tablet 0   promethazine -dextromethorphan (PROMETHAZINE -DM) 6.25-15 MG/5ML syrup Take 5 mLs by mouth 4 (four) times daily as needed. 118 mL 0   cariprazine  (VRAYLAR ) 1.5 MG capsule Take 1 capsule (1.5 mg total) by mouth daily. 30 capsule 0   lamoTRIgine  (LAMICTAL ) 200 MG tablet Take 1 tablet (200 mg total) by mouth daily. 30 tablet 0   levothyroxine  (SYNTHROID ) 150 MCG tablet Take 1 tablet (150 mcg total) by mouth daily before breakfast. Must keep scheduled appt for future refills 90 tablet 3   metoprolol  succinate (TOPROL -XL) 50 MG 24  hr tablet Take 1 tablet (50 mg total) by mouth daily. Take with or immediately following a meal. 90 tablet 3   modafinil  (PROVIGIL ) 100 MG tablet Take 1 tablet (100 mg total) by mouth daily, along with 200mg  tablet. 30 tablet 3   modafinil  (PROVIGIL ) 200 MG tablet Take 1 tablet (200 mg total) by mouth daily  at 9 A.M. 30 tablet 3   omeprazole  (PRILOSEC) 20 MG capsule Take 20 mg by mouth.     omeprazole  (PRILOSEC) 20 MG capsule Take 1 capsule (20 mg total) by mouth daily. Open capsule, granules will dispense in water or can be taken with a spoonful of greek yogurt or applesauce. 180 capsule 0   rosuvastatin  (CRESTOR ) 20 MG tablet Take 1 tablet (20 mg total) by mouth daily. 90 tablet 3   Semaglutide ,0.25 or 0.5MG /DOS, (OZEMPIC , 0.25 OR 0.5 MG/DOSE,) 2 MG/3ML SOPN Inject 0.25 mg into the skin once a week. 3 mL 0   sertraline  (ZOLOFT ) 100 MG tablet Take 2 tablets (200 mg total) by mouth daily. 60 tablet 0   spironolactone  (ALDACTONE ) 25 MG tablet Take 0.5 tablets (12.5 mg total) by mouth daily. 45 tablet 3   torsemide  (DEMADEX ) 20 MG tablet Take 4 tablets (80 mg total) by mouth 2 (two) times daily as needed. 720 tablet 2   triamcinolone  cream (KENALOG ) 0.1 % Apply 1 Application topically 2 (two) times daily. 30 g 1   Zinc 50 MG TABS Take 50 mg by mouth.     No current facility-administered medications for this visit.    Medication Side Effects: None  Allergies: Allergies[1]  Past Medical History:  Diagnosis Date   Acute renal failure (ARF) 11/2012    multifactorial-likely secondary to ATN in the setting of sepsis and hypotension, and also from rhabdomyolysis   Anemia    Cellulitis    Chronic diastolic CHF (congestive heart failure) (HCC)    Depression    Diabetes mellitus (HCC)    GERD (gastroesophageal reflux disease)    Hyperlipidemia    Hypertension    Hypothyroidism    Morbid obesity with BMI of 70 and over, adult (HCC)    Obesity hypoventilation syndrome (HCC)    OSA (obstructive sleep apnea)    PVC's (premature ventricular contractions)    Recurrent cellulitis of lower leg    LLE, venous insuff   Sepsis (HCC) 11/2012   Secondary to cellulitis   Sleep apnea    Venous insufficiency of leg     Family History  Problem Relation Age of Onset   Hypertension Mother    Diabetes  Mother    High Cholesterol Mother    Thyroid  disease Mother    Sleep apnea Mother    Obesity Mother    Hypertension Father    Heart disease Father        before age 61   High Cholesterol Father    Sleep apnea Father    Obesity Father     Social History   Socioeconomic History   Marital status: Married    Spouse name: Not on file   Number of children: 0   Years of education: Not on file   Highest education level: Not on file  Occupational History   Occupation: Librarian    Employer: [employer]  Tobacco Use   Smoking status: Former    Current packs/day: 0.00    Average packs/day: 0.5 packs/day for 8.0 years (4.0 ttl pk-yrs)    Types: Cigarettes    Start date: 2003  Quit date: 2011    Years since quitting: 15.0   Smokeless tobacco: Never  Vaping Use   Vaping status: Never Used  Substance and Sexual Activity   Alcohol use: No    Alcohol/week: 0.0 standard drinks of alcohol   Drug use: No   Sexual activity: Not Currently    Birth control/protection: None  Other Topics Concern   Not on file  Social History Narrative   Librarian, 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)   Social Drivers of Health   Tobacco Use: Medium Risk (11/12/2024)   Patient History    Smoking Tobacco Use: Former    Smokeless Tobacco Use: Never    Passive Exposure: Not on Actuary Strain: Low Risk (06/13/2024)   [name]   Overall Financial Resource Strain (CARDIA)    How hard is it for you to pay for the very basics like food, housing, medical care, and heating?: Not hard at all  Food Insecurity: No Food Insecurity (06/13/2024)   Received from [facility]   Epic    Within the past 12 months, you worried that your food would run out before you got the money to buy more.: Never true    Within the past 12 months, the food you bought just didn't last and you didn't have money to get more.: Never true  Transportation Needs: No Transportation Needs  (06/13/2024)   Received from [facility]   PRAPARE - Transportation    Lack of Transportation (Medical): No    Lack of Transportation (Non-Medical): No  Physical Activity: Not on file  Stress: Not on file  Social Connections: Not on file  Intimate Partner Violence: Not At Risk (02/12/2024)   Humiliation, Afraid, Rape, and Kick questionnaire    Fear of Current or Ex-Partner: No    Emotionally Abused: No    Physically Abused: No    Sexually Abused: No  Depression (PHQ2-9): High Risk (09/13/2024)   Depression (PHQ2-9)    PHQ-2 Score: 12  Alcohol Screen: Not on file  Housing: Low Risk (02/12/2024)   Housing Stability Vital Sign    Unable to Pay for Housing in the Last Year: No    Number of Times Moved in the Last Year: 0    Homeless in the Last Year: No  Utilities: Not At Risk (02/12/2024)   A[facility] Utilities    Threatened with loss of utilities: No  Health Literacy: Not on file    Past Medical History, Surgical history, Social history, and Family history were reviewed and updated as appropriate.   Please see review of systems for further details on the patient's review from today.   Objective:   Physical Exam:  There were no vitals taken for this visit.  Physical Exam Constitutional:      General: She is not in acute distress. Musculoskeletal:        General: No deformity.  Neurological:     Mental Status: She is alert and oriented to person, place, and time.     Coordination: Coordination normal.  Psychiatric:        Attention and Perception: Attention and perception normal. She does not perceive auditory or visual hallucinations.        Mood and Affect: Mood normal. Mood is not anxious or depressed. Affect is not labile, blunt, angry or inappropriate.        Speech: Speech normal.        Behavior: Behavior normal.  Thought Content: Thought content normal. Thought content is not paranoid or delusional. Thought content does not include homicidal or suicidal ideation.  Thought content does not include homicidal or suicidal plan.        Cognition and Memory: Cognition and memory normal.        Judgment: Judgment normal.     Comments: Insight intact     Lab Review:     Component Value Date/Time   NA 141 11/12/2024 1255   NA 142 11/13/2023 1325   K 3.8 11/12/2024 1255   CL 103 11/12/2024 1255   CO2 27 11/12/2024 1255   GLUCOSE 90 11/12/2024 1255   BUN 24 (H) 11/12/2024 1255   BUN 20 11/13/2023 1325   CREATININE 1.21 (H) 11/12/2024 1255   CREATININE 1.06 11/14/2016 1050   CALCIUM  9.2 11/12/2024 1255   CALCIUM  9.6 06/26/2007 0000   PROT 7.0 11/12/2024 1255   PROT 6.6 11/13/2023 1325   ALBUMIN  3.8 11/12/2024 1255   ALBUMIN  3.7 (L) 11/13/2023 1325   AST 18 11/12/2024 1255   ALT 17 11/12/2024 1255   ALKPHOS 107 11/12/2024 1255   BILITOT 0.2 11/12/2024 1255   GFRNONAA 56 (L) 11/12/2024 1255   GFRAA 81 11/11/2020 1211       Component Value Date/Time   WBC 7.6 11/12/2024 1255   RBC 3.65 (L) 11/12/2024 1255   HGB 9.8 (L) 11/12/2024 1255   HGB 8.2 (L) 07/22/2024 1127   HGB 8.7 (L) 10/13/2023 1058   HCT 31.9 (L) 11/12/2024 1255   HCT 27.4 (L) 11/13/2023 1325   PLT 214 11/12/2024 1255   PLT 252 07/22/2024 1127   PLT 316 10/13/2023 1058   MCV 87.4 11/12/2024 1255   MCV 88 10/13/2023 1058   MCH 26.8 11/12/2024 1255   MCHC 30.7 11/12/2024 1255   RDW 17.4 (H) 11/12/2024 1255   RDW 15.4 10/13/2023 1058   LYMPHSABS 1.2 11/12/2024 1255   LYMPHSABS 1.2 03/13/2023 1511   MONOABS 0.5 11/12/2024 1255   EOSABS 0.1 11/12/2024 1255   EOSABS 0.2 03/13/2023 1511   BASOSABS 0.0 11/12/2024 1255   BASOSABS 0.0 03/13/2023 1511    No results found for: POCLITH, LITHIUM   No results found for: PHENYTOIN, PHENOBARB, VALPROATE, CBMZ   .res Assessment: Plan:    Plan:   PDMP reviewed  Change: Increase Vraylar  1.5mg  to 3mg  daily   Continue: Zoloft  100mg  daily - take two tablets daily Lamictal  200mg  at hs  RTC 4 weeks  15 minutes  spent dedicated to the care of this patient on the date of this encounter to include pre-visit review of records, ordering of medication, post visit documentation, and face-to-face time with the patient discussing depression, anxiety, insomnia and obsessional thoughts. Discussed continuing current medication regimen.  Patient advised to contact office with any questions, adverse effects, or acute worsening in signs and symptoms.  Discussed potential metabolic side effects associated with atypical antipsychotics, as well as potential risk for movement side effects. Advised pt to contact office if movement side effects occur.  There are no diagnoses linked to this encounter.   Please see After Visit Summary for patient specific instructions.  Future Appointments  Date Time Provider Department Center  11/29/2024  1:00 PM Shantrell Placzek, Angeline Mattocks, NP [facility] None  12/04/2024  1:00 PM Cottle, Bambi G, LCSW [facility] None  12/09/2024 11:40 AM [place] Adelita PENNER, MD [facility] None  12/11/2024  1:00 PM Cottle, Bambi G, LCSW [facility] None  12/18/2024  1:00 PM Cottle, Bambi G, LCSW  [facility] None  12/19/2024  1:00 PM Zaunegger, Prentice RAMAN, PT [facility] [address]  12/26/2024  2:00 PM [name] Kendall C, PT [facility] None  12/27/2024  9:40 AM [name] Alan SAUNDERS, PA-C [facility]  12/27/2024  1:00 PM Zaunegger, Prentice RAMAN, PT [facility] [address]  12/30/2024  1:20 PM Alvia Corean CROME, FNP [facility]  01/01/2025  1:00 PM Cottle, Peggye MATSU, LCSW [facility] None  01/02/2025  1:00 PM Zaunegger, Prentice RAMAN, PT [facility] [address]  01/09/2025  1:00 PM Zaunegger, Prentice RAMAN, PT [facility] [address]  01/15/2025  1:00 PM Cottle, Peggye MATSU, LCSW [facility] None  01/15/2025  8:00 PM Neda Jennet LABOR, MD [facility]  01/29/2025  1:00 PM Cottle, Bambi G, LCSW [facility] None  02/11/2025  1:30 PM [facility] None  02/11/2025  2:00 PM Iruku, Amber, MD [facility] None  02/12/2025  1:00 PM Cottle, Bambi G, LCSW [facility]  None    No orders of the defined types were placed in this encounter.     -------------------------------     [1]  Allergies Allergen Reactions   Aspirin Hives, Swelling and Other (See Comments)    REACTION: throat swelling, hives  Other reaction(s): Unknown  REACTION: throat swelling, hives Other reaction(s): Unknown Other reaction(s): Unknown   Doxycycline Other (See Comments)    Abdominal pain, difficulty swallowing   Lisinopril Cough   Niacin     Other reaction(s): Unknown Other reaction(s): Unknown   Niacin And Related     Other reaction(s): Unknown Other reaction(s): Unknown   Niaspan [Niacin Er (Antihyperlipidemic)]     Caused flushing   Wound Dressing Adhesive Itching    Welts and burning of the skin   Wound Dressings Itching    Welts and burning of the skin

## 2024-12-01 ENCOUNTER — Other Ambulatory Visit (HOSPITAL_COMMUNITY): Payer: Self-pay

## 2024-12-02 ENCOUNTER — Other Ambulatory Visit (HOSPITAL_COMMUNITY): Payer: Self-pay

## 2024-12-04 ENCOUNTER — Ambulatory Visit (INDEPENDENT_AMBULATORY_CARE_PROVIDER_SITE_OTHER): Admitting: Psychology

## 2024-12-04 DIAGNOSIS — F3341 Major depressive disorder, recurrent, in partial remission: Secondary | ICD-10-CM | POA: Diagnosis not present

## 2024-12-04 DIAGNOSIS — F50819 Binge eating disorder, unspecified: Secondary | ICD-10-CM | POA: Diagnosis not present

## 2024-12-04 DIAGNOSIS — F4310 Post-traumatic stress disorder, unspecified: Secondary | ICD-10-CM | POA: Diagnosis not present

## 2024-12-04 NOTE — Progress Notes (Signed)
 " [facility] Counselor/Therapist Progress Note  Patient ID: DIDI GANAWAY, MRN: [id number],    Date: 12/04/2024  Time spent: 53 minutes  Time In:  1:05 Time out:1:57  Treatment Type: Individual Therapy  Reported Symptoms: sadness,   Mental Status Exam: Appearance:  Casual     Behavior: Appropriate  Motor: Normal  Speech/Language:  Normal Rate  Affect: blunted  Mood: pleasant  Thought process: normal  Thought content:   WNL  Sensory/Perceptual disturbances:   WNL  Orientation: oriented to person, place, time/date, and situation  Attention: Good  Concentration: Good  Memory: WNL  Fund of knowledge:  Good  Insight:   Good  Judgment:  Good  Impulse Control: Good   Risk Assessment: Danger to Self:  No Self-injurious Behavior: No Danger to Others: No Duty to Warn:no Physical Aggression / Violence:No  Access to Firearms a concern: No  Gang Involvement:No   Subjective: The patient attended a face-to-face individual therapy session via video visit today.  The patient gave verbal consent for the session to be on caregility.  The patient is aware of the limitations of telehealth and she was in her home alone and the therapist was in the office.  The patient presents as pleasant and cooperative.  The patient reports that her medication provider increased her Vraylar .  She said its only been this week so she is not sure how long affect her.  Apparently she has an endoscopy scheduled in the near future.  We talked today about how she has been doing and she is continuing to lose weight and she did report that she did go to the gynecologist and she had never been to a gynecologist before and did have a screening done.  She said that it was very stressful for her but she seemed to be pleased with herself that she had done that.  In addition the patient talked about her relationship that she has with the gentleman that is on line and I think that she has come to a realization that  he is not as high on a pedestal as he was previously and that maybe she is not getting as much out of the relationship as she thought she was in the past.  We still have not addressed what we need to with the EMDR and she may not be ready to do that so therefore I will let her take the lead as to whether she really wants to do any more EMDR or not.   Interventions: Cognitive Behavioral Therapy, Assertiveness/Communication, and Problem solving, psycho education  Diagnosis:PTSD (post-traumatic stress disorder)  Major depressive disorder, recurrent episode, in partial remission  Binge eating disorder, unspecified severity  Plan: Treatment Plan  Strengths/Abilities:  Intelligent, ability for insight, supportive wife  Treatment Preferences:  Outpatient Individual therapy  Statement of Needs:  I Need help with my depression and motivation.   Symptoms:Depressed or irritable mood.:(Status: improved). Diminished interest in or  enjoyment of activities.(Status: improved). Feelings of hopelessness,  worthlessness, or inappropriate guilt.: (Status: improved). History of chronic or recurrent depression for which the client has taken antidepressant medication, been hospitalized, had  outpatient treatment, or had a course of electroconvulsive therapy.(Status:  maintained). Lack of energy.: (Status: maintained). Low self-esteem.:  (Status: improved). Poor concentration and indecisiveness.:  (Status: improved). Social withdrawal.:  (Status: maintained).  Unresolved grief issues.: (Status: maintained).  Problems Addressed:  Unipolar Depression  Goals:  LTG:1. Alleviate depressive symptoms and return to previous level of effective  functioning.  70%  2. Appropriately grieve the loss in order to normalize mood and to return  to previously adaptive level of functioning. 3. Develop healthy interpersonal relationships that lead to the alleviation  and help prevent the relapse of depression.  70% 4.  Develop healthy thinking patterns and beliefs about self, others, and the world that lead to the alleviation and help prevent the relapse of  Depression  70% STG:1.Describe current and past experiences with depression including their impact on functioning and  attempts to resolve it.  70 % 2. Identify and replace thoughts and beliefs that support depression.  70 % 3.Learn and implement behavioral strategies to overcome depression. 70% 4.Verbalize an understanding of healthy and unhealthy emotions with the intent of increasing the use of  healthy emotions to guide actions.  50 %  Target Date:  10/07/2025 Frequency: Bi Weekly Modality:Individual Interventions by Therapist:  CBT, problem solving, EMDR, insight oriented  Patient approved Treatment Plan  Alizah Sills G Christa Fasig, LCSW                                                                                                     "

## 2024-12-09 ENCOUNTER — Encounter (INDEPENDENT_AMBULATORY_CARE_PROVIDER_SITE_OTHER): Payer: Self-pay | Admitting: Family Medicine

## 2024-12-09 ENCOUNTER — Ambulatory Visit (INDEPENDENT_AMBULATORY_CARE_PROVIDER_SITE_OTHER): Admitting: Family Medicine

## 2024-12-09 VITALS — BP 125/88 | HR 83 | Temp 98.1°F | Ht 63.0 in | Wt 271.0 lb

## 2024-12-09 DIAGNOSIS — Z7985 Long-term (current) use of injectable non-insulin antidiabetic drugs: Secondary | ICD-10-CM | POA: Diagnosis not present

## 2024-12-09 DIAGNOSIS — E1165 Type 2 diabetes mellitus with hyperglycemia: Secondary | ICD-10-CM

## 2024-12-09 DIAGNOSIS — Z6841 Body Mass Index (BMI) 40.0 and over, adult: Secondary | ICD-10-CM | POA: Diagnosis not present

## 2024-12-09 NOTE — Progress Notes (Signed)
" ° °  SUBJECTIVE:  Chief Complaint: Obesity  Interim History: Patient had a good holiday season.  She has had some medical appointments and a few procedures since our last appointment.  She has a good amount of upcoming appointments for pelvic floor evaluation, water therapy, new PCP establishment, new GI establish care appointment as well.  She has been doing well following her eating plan since our last appointment.  No improvement in her GI symptoms.  She is starting to notice some cycle with her bowel movements. Recent increase of her Vraylar .   Alexandra Beck is here to discuss her progress with her obesity treatment plan. She is on the following a lower carbohydrate, vegetable and lean protein rich diet plan and states she is following her eating plan approximately 90-95 % of the time. She states she is not exercising.   OBJECTIVE: Visit Diagnoses: Problem List Items Addressed This Visit       Endocrine   Diabetes mellitus (HCC) - Primary     Other   Morbid obesity (HCC)   Other Visit Diagnoses       BMI 45.0-49.9, adult (HCC)           Vitals Temp: 98.1 F (36.7 C) BP: 125/88 Pulse Rate: 83 SpO2: 97 %   Anthropometric Measurements Height: 5' 3 (1.6 m) Weight: 271 lb (122.9 kg) BMI (Calculated): 48.02 Weight at Last Visit: 287 lb Weight Lost Since Last Visit: 16 lb Weight Gained Since Last Visit: 0 Starting Weight: 468 lb Total Weight Loss (lbs): 197 lb (89.4 kg)   Body Composition  Body Fat %: 59.4 % Fat Mass (lbs): 161.4 lbs Muscle Mass (lbs): 104.6 lbs Visceral Fat Rating : 21   Other Clinical Data Today's Visit #: 30 Starting Date: 04/17/18 Comments: [facility]/[place]     ASSESSMENT AND PLAN: Assessment & Plan Type 2 diabetes mellitus with hyperglycemia, without long-term current use of insulin  (HCC) Diabetes on last A1c check in October at goal of 5.6.  She has restarted semaglutide  in hopes that it will help slow digestion and decrease some of her increased bowel  frequency.  Will repeat labs in April 2026 either here or at bariatric clinic BMI 45.0-49.9, adult [facility])  Morbid obesity (HCC)    Diet: Alexandra Beck is currently in the action stage of change. As such, her goal is to continue with weight loss efforts and has agreed to following a lower carbohydrate, vegetable and lean protein rich diet plan.   Exercise:  All adults should avoid inactivity. Some activity is better than none, and adults who participate in any amount of physical activity, gain some health benefits.  Behavior Modification:  We discussed the following Behavioral Modification Strategies today: increasing lean protein intake, decreasing simple carbohydrates, and no skipping meals. We discussed various medication options to help Alexandra Beck with her weight loss efforts and we both agreed to continue semaglutide  at current dosage..  Follow-up in 5 weeks  She was informed of the importance of frequent follow up visits to maximize her success with intensive lifestyle modifications for her multiple health conditions.  Attestation Statements:   Reviewed by clinician on day of visit: allergies, medications, problem list, medical history, surgical history, family history, social history, and previous encounter notes.     Alexandra Cho, MD "

## 2024-12-11 ENCOUNTER — Other Ambulatory Visit (HOSPITAL_COMMUNITY): Payer: Self-pay

## 2024-12-11 ENCOUNTER — Ambulatory Visit (INDEPENDENT_AMBULATORY_CARE_PROVIDER_SITE_OTHER): Admitting: Psychology

## 2024-12-11 ENCOUNTER — Other Ambulatory Visit: Payer: Self-pay

## 2024-12-11 DIAGNOSIS — F4310 Post-traumatic stress disorder, unspecified: Secondary | ICD-10-CM | POA: Diagnosis not present

## 2024-12-11 DIAGNOSIS — F50819 Binge eating disorder, unspecified: Secondary | ICD-10-CM | POA: Diagnosis not present

## 2024-12-11 DIAGNOSIS — F3341 Major depressive disorder, recurrent, in partial remission: Secondary | ICD-10-CM | POA: Diagnosis not present

## 2024-12-11 MED ORDER — CHOLESTYRAMINE LIGHT 4 G PO PACK
4.0000 g | PACK | Freq: Two times a day (BID) | ORAL | 1 refills | Status: AC
  Filled 2024-12-11: qty 60, 30d supply, fill #0

## 2024-12-11 NOTE — Progress Notes (Unsigned)
 " [facility] Counselor/Therapist Progress Note  Patient ID: Alexandra Beck, MRN: [id number],    Date: 12/11/2024  Time spent: 57 minutes  Time In:  1:04 Time out:2:01  Treatment Type: Individual Therapy  Reported Symptoms: sadness,   Mental Status Exam: Appearance:  Casual     Behavior: Appropriate  Motor: Normal  Speech/Language:  Normal Rate  Affect: blunted  Mood: pleasant  Thought process: normal  Thought content:   WNL  Sensory/Perceptual disturbances:   WNL  Orientation: oriented to person, place, time/date, and situation  Attention: Good  Concentration: Good  Memory: WNL  Fund of knowledge:  Good  Insight:   Good  Judgment:  Good  Impulse Control: Good   Risk Assessment: Danger to Self:  No Self-injurious Behavior: No Danger to Others: No Duty to Warn:no Physical Aggression / Violence:No  Access to Firearms a concern: No  Gang Involvement:No   Subjective: The patient attended a face-to-face individual therapy session via video visit today.  The patient gave verbal consent for the session to be on caregility.  The patient is aware of the limitations of telehealth and she was in her home alone and the therapist was in the office.  The patient presents as pleasant and cooperative.  The patient reports that she feels like she is doing okay today.  The patient does seem less anxious and much more focused and not as critical of herself.  We talked about the things she is doing that are good for her and it seems like she has been doing a little better with that.  She is dreaming and that seems to level her out a bit.  We talked about her coming into the office starting next week because her work schedule will be able to accommodate EMDR better that way.  She does report that she wants to complete EMDR and we will do it until we get her targets cleared out and then she can go back to doing video visits then.   Interventions: Cognitive Behavioral Therapy,  Assertiveness/Communication, and Problem solving, psycho education  Diagnosis:PTSD (post-traumatic stress disorder)  Major depressive disorder, recurrent episode, in partial remission  Binge eating disorder, unspecified severity  Plan: Treatment Plan  Strengths/Abilities:  Intelligent, ability for insight, supportive wife  Treatment Preferences:  Outpatient Individual therapy  Statement of Needs:  I Need help with my depression and motivation.   Symptoms:Depressed or irritable mood.:(Status: improved). Diminished interest in or  enjoyment of activities.(Status: improved). Feelings of hopelessness,  worthlessness, or inappropriate guilt.: (Status: improved). History of chronic or recurrent depression for which the client has taken antidepressant medication, been hospitalized, had  outpatient treatment, or had a course of electroconvulsive therapy.(Status:  maintained). Lack of energy.: (Status: maintained). Low self-esteem.:  (Status: improved). Poor concentration and indecisiveness.:  (Status: improved). Social withdrawal.:  (Status: maintained).  Unresolved grief issues.: (Status: maintained).  Problems Addressed:  Unipolar Depression  Goals:  LTG:1. Alleviate depressive symptoms and return to previous level of effective  functioning.  70% 2. Appropriately grieve the loss in order to normalize mood and to return  to previously adaptive level of functioning. 3. Develop healthy interpersonal relationships that lead to the alleviation  and help prevent the relapse of depression.  70% 4. Develop healthy thinking patterns and beliefs about self, others, and the world that lead to the alleviation and help prevent the relapse of  Depression  70% STG:1.Describe current and past experiences with depression including their impact on functioning and  attempts to  resolve it.  70 % 2. Identify and replace thoughts and beliefs that support depression.  70 % 3.Learn and implement  behavioral strategies to overcome depression. 70% 4.Verbalize an understanding of healthy and unhealthy emotions with the intent of increasing the use of  healthy emotions to guide actions.  50 %  Target Date:  10/07/2025 Frequency: Bi Weekly Modality:Individual Interventions by Therapist:  CBT, problem solving, EMDR, insight oriented  Patient approved Treatment Plan  Marah Park G Yeshaya Vath, LCSW                                                                                                     "

## 2024-12-12 ENCOUNTER — Other Ambulatory Visit: Payer: Self-pay

## 2024-12-12 ENCOUNTER — Other Ambulatory Visit (HOSPITAL_COMMUNITY): Payer: Self-pay

## 2024-12-13 ENCOUNTER — Other Ambulatory Visit (HOSPITAL_COMMUNITY): Payer: Self-pay

## 2024-12-16 ENCOUNTER — Other Ambulatory Visit (HOSPITAL_COMMUNITY): Payer: Self-pay

## 2024-12-16 ENCOUNTER — Other Ambulatory Visit: Payer: Self-pay | Admitting: Physician Assistant

## 2024-12-17 ENCOUNTER — Other Ambulatory Visit: Payer: Self-pay

## 2024-12-17 ENCOUNTER — Other Ambulatory Visit (HOSPITAL_COMMUNITY): Payer: Self-pay

## 2024-12-18 ENCOUNTER — Ambulatory Visit: Admitting: Psychology

## 2024-12-18 ENCOUNTER — Other Ambulatory Visit (HOSPITAL_COMMUNITY): Payer: Self-pay

## 2024-12-18 DIAGNOSIS — F4310 Post-traumatic stress disorder, unspecified: Secondary | ICD-10-CM

## 2024-12-18 DIAGNOSIS — F3341 Major depressive disorder, recurrent, in partial remission: Secondary | ICD-10-CM | POA: Diagnosis not present

## 2024-12-18 DIAGNOSIS — F50819 Binge eating disorder, unspecified: Secondary | ICD-10-CM

## 2024-12-18 MED ORDER — METAMUCIL 48.57 % PO POWD: 1.0000 | Freq: Two times a day (BID) | ORAL | 1 refills | Status: DC

## 2024-12-18 NOTE — Progress Notes (Signed)
 " [facility] Counselor/Therapist Progress Note  Patient ID: Alexandra Beck, MRN: [id number],    Date: 12/17/2024  Time spent: 57 minutes  Time In:  1:05 Time out:2:02  Treatment Type: Individual Therapy  Reported Symptoms: sadness,   Mental Status Exam: Appearance:  Casual     Behavior: Appropriate  Motor: Normal  Speech/Language:  Normal Rate  Affect: blunted  Mood: pleasant  Thought process: normal  Thought content:   WNL  Sensory/Perceptual disturbances:   WNL  Orientation: oriented to person, place, time/date, and situation  Attention: Good  Concentration: Good  Memory: WNL  Fund of knowledge:  Good  Insight:   Good  Judgment:  Good  Impulse Control: Good   Risk Assessment: Danger to Self:  No Self-injurious Behavior: No Danger to Others: No Duty to Warn:no Physical Aggression / Violence:No  Access to Firearms a concern: No  Gang Involvement:No   Subjective: The patient attended a face-to-face individual therapy session via video visit today.  The patient gave verbal consent for the session to be on caregility.  The patient is aware of the limitations of telehealth and she was in her home alone and the therapist was in the office.  The patient presents as pleasant and cooperative.  The patient had said that she was coming into the office so I went out to get her in the lobby before we started the session and then I realize she was not out there so I had to go and change the plan to do our session on video visit.  Today we talked about some of the things that she was doing and it seems that she was doing some crafts for herself and that was positive and she seems to be settling into losing weight and feeling good about that.  She does not seem to be doing as much self-loathing as she has done in the past but we still are going to look at doing EMDR related to that and her father.  Today we mostly talked about how she is being productive at home and the things  that she is doing that are crampy and good for her.   Interventions: Cognitive Behavioral Therapy, Assertiveness/Communication, and Problem solving, psycho education  Diagnosis:PTSD (post-traumatic stress disorder)  Major depressive disorder, recurrent episode, in partial remission  Binge eating disorder, unspecified severity  Plan: Treatment Plan  Strengths/Abilities:  Intelligent, ability for insight, supportive wife  Treatment Preferences:  Outpatient Individual therapy  Statement of Needs:  I Need help with my depression and motivation.   Symptoms:Depressed or irritable mood.:(Status: improved). Diminished interest in or  enjoyment of activities.(Status: improved). Feelings of hopelessness,  worthlessness, or inappropriate guilt.: (Status: improved). History of chronic or recurrent depression for which the client has taken antidepressant medication, been hospitalized, had  outpatient treatment, or had a course of electroconvulsive therapy.(Status:  maintained). Lack of energy.: (Status: maintained). Low self-esteem.:  (Status: improved). Poor concentration and indecisiveness.:  (Status: improved). Social withdrawal.:  (Status: maintained).  Unresolved grief issues.: (Status: maintained).  Problems Addressed:  Unipolar Depression  Goals:  LTG:1. Alleviate depressive symptoms and return to previous level of effective  functioning.  70% 2. Appropriately grieve the loss in order to normalize mood and to return  to previously adaptive level of functioning. 3. Develop healthy interpersonal relationships that lead to the alleviation  and help prevent the relapse of depression.  70% 4. Develop healthy thinking patterns and beliefs about self, others, and the world that lead to the  alleviation and help prevent the relapse of  Depression  70% STG:1.Describe current and past experiences with depression including their impact on functioning and  attempts to resolve it.  70 % 2.  Identify and replace thoughts and beliefs that support depression.  70 % 3.Learn and implement behavioral strategies to overcome depression. 70% 4.Verbalize an understanding of healthy and unhealthy emotions with the intent of increasing the use of  healthy emotions to guide actions.  50 %  Target Date:  10/07/2025 Frequency: Bi Weekly Modality:Individual Interventions by Therapist:  CBT, problem solving, EMDR, insight oriented  Patient approved Treatment Plan  Mechell Girgis G Lijah Bourque, LCSW                                                                                                     "

## 2024-12-18 NOTE — Assessment & Plan Note (Signed)
 Diabetes on last A1c check in October at goal of 5.6.  She has restarted semaglutide  in hopes that it will help slow digestion and decrease some of her increased bowel frequency.  Will repeat labs in April 2026 either here or at bariatric clinic

## 2024-12-19 ENCOUNTER — Ambulatory Visit (HOSPITAL_BASED_OUTPATIENT_CLINIC_OR_DEPARTMENT_OTHER): Attending: Family Medicine | Admitting: Physical Therapy

## 2024-12-19 ENCOUNTER — Other Ambulatory Visit: Payer: Self-pay

## 2024-12-19 ENCOUNTER — Encounter (HOSPITAL_BASED_OUTPATIENT_CLINIC_OR_DEPARTMENT_OTHER): Payer: Self-pay | Admitting: Physical Therapy

## 2024-12-19 DIAGNOSIS — M6281 Muscle weakness (generalized): Secondary | ICD-10-CM | POA: Insufficient documentation

## 2024-12-19 DIAGNOSIS — R2689 Other abnormalities of gait and mobility: Secondary | ICD-10-CM | POA: Insufficient documentation

## 2024-12-19 DIAGNOSIS — R5381 Other malaise: Secondary | ICD-10-CM | POA: Insufficient documentation

## 2024-12-19 NOTE — Therapy (Signed)
 " OUTPATIENT PHYSICAL THERAPY LOWER EXTREMITY EVALUATION   Patient Name: Alexandra Beck MRN: [id number] DOB:[birth year], 44 y.o., female Today's Date: 12/19/2024  END OF SESSION:  PT End of Session - 12/19/24 1302     Visit Number 1    Number of Visits 16    Date for Recertification  02/13/25    Authorization Type [employer]    PT Start Time 1303    PT Stop Time 1345    PT Time Calculation (min) 42 min    Activity Tolerance Patient tolerated treatment well    Behavior During Therapy WFL for tasks assessed/performed          Past Medical History:  Diagnosis Date   Acute renal failure (ARF) 11/2012    multifactorial-likely secondary to ATN in the setting of sepsis and hypotension, and also from rhabdomyolysis   Anemia    Cellulitis    Chronic diastolic CHF (congestive heart failure) (HCC)    Depression    Diabetes mellitus (HCC)    GERD (gastroesophageal reflux disease)    Hyperlipidemia    Hypertension    Hypothyroidism    Morbid obesity with BMI of 70 and over, adult (HCC)    Obesity hypoventilation syndrome (HCC)    OSA (obstructive sleep apnea)    PVC's (premature ventricular contractions)    Recurrent cellulitis of lower leg    LLE, venous insuff   Sepsis (HCC) 11/2012   Secondary to cellulitis   Sleep apnea    Venous insufficiency of leg    Past Surgical History:  Procedure Laterality Date   GASTRIC BYPASS  06/12/2024   IR FLUORO GUIDE CV MIDLINE PICC RIGHT  03/28/2017   IR US  GUIDE VASC ACCESS RIGHT  03/28/2017   WISDOM TOOTH EXTRACTION     Patient Active Problem List   Diagnosis Date Noted   Iron  deficiency anemia, unspecified 08/19/2024   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   Yeast vaginitis 02/16/2024   Severe sepsis (HCC) 02/07/2024   UTI (urinary tract infection) 02/07/2024   IDA (iron  deficiency anemia) 11/13/2023   Diverticulitis of large intestine with abscess 09/21/2023   Diarrhea 09/24/2022   Abdominal pain 09/23/2022   Encounter for well  adult exam with abnormal findings 06/17/2022   Bilateral hearing loss 06/17/2022   Anemia 06/17/2022   Well woman exam 06/17/2022   Fever 05/14/2022   Hypokalemia 05/14/2022   Lightheadedness 05/14/2022   Shortness of breath 05/14/2022   Pneumonia 05/13/2022   Eczema, dyshidrotic 09/19/2021   Chronic pain 09/19/2021   Cellulitis and abscess of neck 05/14/2021   Diabetes mellitus (HCC) 10/19/2020   Psoriasis 12/26/2019   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   Hypersomnolence 05/11/2018   Vitamin D  deficiency 05/02/2018   Cellulitis of left leg 04/17/2017   Cellulitis of right leg 01/11/2017   PVC's (premature ventricular contractions) 12/02/2016   Chronic diastolic CHF (congestive heart failure) (HCC)    AKI (acute kidney injury)    Hyperlipidemia associated with type 2 diabetes mellitus (HCC)    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)    Hypothyroidism    GERD (gastroesophageal reflux disease)    Depression    Venous insufficiency of leg    Recurrent cellulitis of lower leg 12/25/2012   OSA (obstructive sleep apnea) 12/25/2012   Obesity hypoventilation syndrome (HCC) 12/25/2012   Morbid obesity (HCC) 12/24/2012   Hypertension associated with diabetes (HCC) 06/26/2007    PCP: Norleen Agent  W, MD   REFERRING PROVIDER: [place] Adelita PENNER, MD  REFERRING DIAG: R58.81 (ICD-10-CM) - Physical debility  THERAPY DIAG:  Muscle weakness (generalized)  Other abnormalities of gait and mobility  Rationale for Evaluation and Treatment: Rehabilitation  ONSET DATE: chronic   SUBJECTIVE:   SUBJECTIVE STATEMENT: Patient states gastric bypass in July. Didn't have a great deal of strength/balance before but now that weight is fluctuating she wants to improve it further.  Difficulty with stairs, curbs, endurance, balance. Has lost 150# since December 2024.  PERTINENT HISTORY: CHF, DM, hx depression, HTN, HLD,  increased BMI, venous insufficiency, CKD,PTSD PAIN:  Are you having pain? No  PRECAUTIONS: None  WEIGHT BEARING RESTRICTIONS: No  FALLS:  Has patient fallen in last 6 months? Yes. Number of falls 1  OCCUPATION: Librarian at [employer]  PLOF: Independent  PATIENT GOALS: improve strength/balance safety   OBJECTIVE: (objective measures from initial evaluation unless otherwise dated)   PATIENT SURVEYS:  LEFS  Extreme difficulty/unable (0), Quite a bit of difficulty (1), Moderate difficulty (2), Little difficulty (3), No difficulty (4) Survey date:  12/19/24  Any of your usual work, housework or school activities 3  2. Usual hobbies, recreational or sporting activities 3  3. Getting into/out of the bath 2  4. Walking between rooms 4  5. Putting on socks/shoes 4  6. Squatting  1  7. Lifting an object, like a bag of groceries from the floor 3  8. Performing light activities around your home 4  9. Performing heavy activities around your home 3  10. Getting into/out of a car 3  11. Walking 2 blocks 3  12. Walking 1 mile 1  13. Going up/down 10 stairs (1 flight) 3  14. Standing for 1 hour 2  15.  sitting for 1 hour 4  16. Running on even ground 0  17. Running on uneven ground 0  18. Making sharp turns while running fast 0  19. Hopping  1  20. Rolling over in bed 3  Score total:  47/80     COGNITION: Overall cognitive status: Within functional limits for tasks assessed     SENSATION: WFL  POSTURE: rounded shoulders and forward head    LOWER EXTREMITY ROM: WFL for tasks assessed  Active ROM Right eval Left eval  Hip flexion    Hip extension    Hip abduction    Hip adduction    Hip internal rotation    Hip external rotation    Knee flexion    Knee extension    Ankle dorsiflexion    Ankle plantarflexion    Ankle inversion    Ankle eversion     (Blank rows = not tested) *= pain/symptoms  LOWER EXTREMITY MMT:  MMT Right eval Left eval  Hip flexion 5 5  Hip  extension    Hip abduction    Hip adduction    Hip internal rotation    Hip external rotation    Knee flexion 4+ 4+  Knee extension 5 5  Ankle dorsiflexion 5 5  Ankle plantarflexion    Ankle inversion    Ankle eversion     (Blank rows = not tested) *= pain/symptoms   FUNCTIONAL TESTS:  5 times sit to stand: 9.59 seconds without UE use Stairs: 7 inch, alternating pattern ascending with bilateral UE support, labored; descending uses step too pattern with LLE, able to complete with alternating pattern with decreased RLE strength and control   GAIT: Distance walked: 100 feet Assistive device  utilized: None Level of assistance: Complete Independence Comments: decreased bilateral hip strength   TODAY'S TREATMENT:                                                                                                                              DATE:  12/19/24 Eval, education, HEP    PATIENT EDUCATION:  Education details: Patient educated on exam findings, POC, scope of PT, HEP, relevant anatomy and biomechanics, and aquatic/land therapy. Person educated: Patient Education method: Explanation, Demonstration, and Handouts Education comprehension: verbalized understanding, returned demonstration, verbal cues required, and tactile cues required  HOME EXERCISE PROGRAM: Access Code: [email or web address] URL: [email or web address] Date: 12/19/2024 Prepared by: Prentice Mekenna Finau  Exercises - Squat with Chair Touch  - 1 x daily - 7 x weekly - 3 sets - 10 reps - Standing Marching  - 1 x daily - 7 x weekly - 3 sets - 10 reps  ASSESSMENT:  CLINICAL IMPRESSION: Patient a 44 y.o. y.o. female who was seen today for physical therapy evaluation and treatment for Physical debility. Patient presents with pain limited deficits in general strength, ROM, endurance, activity tolerance, and functional mobility with ADL. Patient is having to modify and restrict ADL as indicated by outcome measure score as  well as subjective information and objective measures which is affecting overall participation. Patient will benefit from skilled physical therapy in order to improve function and reduce impairment.  OBJECTIVE IMPAIRMENTS: Abnormal gait, decreased activity tolerance, decreased balance, decreased endurance, decreased mobility, difficulty walking, decreased ROM, decreased strength, increased muscle spasms, impaired flexibility, improper body mechanics, obesity, and pain  ACTIVITY LIMITATIONS: carrying, lifting, bending, standing, squatting, stairs, transfers, reach over head, hygiene/grooming, locomotion level, and caring for others  PARTICIPATION LIMITATIONS: meal prep, cleaning, laundry, shopping, community activity, occupation, and yard work  PERSONAL FACTORS: Fitness, Time since onset of injury/illness/exacerbation, and 3+ comorbidities: CHF, DM, hx depression, HTN, HLD, increased BMI, venous insufficiency, CKD,PTSD are also affecting patient's functional outcome.   REHAB POTENTIAL: Good  CLINICAL DECISION MAKING: Evolving/moderate complexity  EVALUATION COMPLEXITY: Moderate   GOALS: Goals reviewed with patient? Yes  SHORT TERM GOALS: Target date: 01/16/2025    Patient will be independent with HEP in order to improve functional outcomes. Baseline: Goal status: INITIAL  2.  Patient will report at least 25% improvement in symptoms for improved quality of life. Baseline: Goal status: INITIAL    LONG TERM GOALS: Target date: 02/13/2025    Patient will report at least 75% improvement in symptoms for improved quality of life. Baseline:  Goal status: INITIAL  2.  Patient will improve LEFS score by at least 10 points in order to indicate improved tolerance to activity. Baseline:  Goal status: INITIAL  3.  Patient will be able to navigate stairs with reciprocal pattern without compensation in order to demonstrate improved LE strength. Baseline:  Goal status: INITIAL  4.   Patient will demonstrate grade of 5/5 MMT grade in all tested musculature as evidence  of improved strength to assist with stair ambulation and gait.  Baseline:  Goal status: INITIAL  5.  Patient will be able to consistently perform exercise routine and progress resistance as tolerated for improved strength, balance, and functional mobility. Baseline:  Goal status: INITIAL    PLAN:  PT FREQUENCY: 1-2x/week  PT DURATION: 8 weeks  PLANNED INTERVENTIONS: [id number]- PT Re-evaluation, 97110-Therapeutic exercises, 97530- Therapeutic activity, [id number]- Neuromuscular re-education, 97535- Self Care, [id number]- Manual therapy, [id number]- Gait training, [phone]- Orthotic Fit/training, [phone]- Canalith repositioning, [id number]- Aquatic Therapy, [id number]- Splinting, [id number]- Wound care (first 20 sq cm), 97598- Wound care (each additional 20 sq cm)Patient/Family education, Balance training, Stair training, Taping, Dry Needling, Joint mobilization, Joint manipulation, Spinal manipulation, Spinal mobilization, Scar mobilization, and DME instructions.  PLAN FOR NEXT SESSION: general strength, endurance, progress HEP, functional strengthening    Prentice GORMAN Stains, PT, DPT 12/19/2024, 1:49 PM  "

## 2024-12-20 ENCOUNTER — Other Ambulatory Visit (HOSPITAL_COMMUNITY): Payer: Self-pay

## 2024-12-23 ENCOUNTER — Other Ambulatory Visit (HOSPITAL_COMMUNITY): Payer: Self-pay

## 2024-12-23 MED ORDER — TORSEMIDE 20 MG PO TABS
80.0000 mg | ORAL_TABLET | Freq: Two times a day (BID) | ORAL | 2 refills | Status: AC | PRN
  Filled 2024-12-23: qty 720, 90d supply, fill #0

## 2024-12-24 ENCOUNTER — Other Ambulatory Visit (HOSPITAL_COMMUNITY): Payer: Self-pay

## 2024-12-26 ENCOUNTER — Encounter: Payer: Self-pay | Admitting: Physical Therapy

## 2024-12-26 ENCOUNTER — Ambulatory Visit: Attending: Family Medicine | Admitting: Physical Therapy

## 2024-12-26 ENCOUNTER — Other Ambulatory Visit: Payer: Self-pay

## 2024-12-26 ENCOUNTER — Telehealth: Admitting: Adult Health

## 2024-12-26 ENCOUNTER — Ambulatory Visit: Admitting: Physical Therapy

## 2024-12-26 ENCOUNTER — Other Ambulatory Visit (HOSPITAL_COMMUNITY): Payer: Self-pay

## 2024-12-26 ENCOUNTER — Encounter: Payer: Self-pay | Admitting: Adult Health

## 2024-12-26 DIAGNOSIS — G4700 Insomnia, unspecified: Secondary | ICD-10-CM | POA: Diagnosis not present

## 2024-12-26 DIAGNOSIS — R152 Fecal urgency: Secondary | ICD-10-CM | POA: Diagnosis not present

## 2024-12-26 DIAGNOSIS — R159 Full incontinence of feces: Secondary | ICD-10-CM | POA: Diagnosis not present

## 2024-12-26 DIAGNOSIS — F4310 Post-traumatic stress disorder, unspecified: Secondary | ICD-10-CM | POA: Diagnosis not present

## 2024-12-26 DIAGNOSIS — F331 Major depressive disorder, recurrent, moderate: Secondary | ICD-10-CM

## 2024-12-26 DIAGNOSIS — R293 Abnormal posture: Secondary | ICD-10-CM | POA: Insufficient documentation

## 2024-12-26 DIAGNOSIS — F411 Generalized anxiety disorder: Secondary | ICD-10-CM

## 2024-12-26 DIAGNOSIS — M6281 Muscle weakness (generalized): Secondary | ICD-10-CM | POA: Insufficient documentation

## 2024-12-26 DIAGNOSIS — R279 Unspecified lack of coordination: Secondary | ICD-10-CM | POA: Diagnosis present

## 2024-12-26 DIAGNOSIS — F3341 Major depressive disorder, recurrent, in partial remission: Secondary | ICD-10-CM

## 2024-12-26 MED ORDER — CARIPRAZINE HCL 1.5 MG PO CAPS
1.5000 mg | ORAL_CAPSULE | Freq: Every day | ORAL | 2 refills | Status: AC
  Filled 2024-12-26: qty 30, 30d supply, fill #0

## 2024-12-26 MED ORDER — CARIPRAZINE HCL 1.5 MG PO CAPS
3.0000 mg | ORAL_CAPSULE | Freq: Every day | ORAL | 2 refills | Status: DC
  Filled 2024-12-26: qty 30, 15d supply, fill #0

## 2024-12-26 MED ORDER — BUPROPION HCL ER (XL) 150 MG PO TB24
150.0000 mg | ORAL_TABLET | Freq: Every day | ORAL | 1 refills | Status: AC
  Filled 2024-12-26: qty 30, 30d supply, fill #0
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
## 2024-12-26 NOTE — Progress Notes (Signed)
 Alexandra Beck [id number] [birth year] 44 y.o.  Virtual Visit via Video Note  I connected with pt @ on 12/26/24 at 11:30 AM EST by a video enabled telemedicine application and verified that I am speaking with the correct person using two identifiers.   I discussed the limitations of evaluation and management by telemedicine and the availability of in person appointments. The patient expressed understanding and agreed to proceed.  I discussed the assessment and treatment plan with the patient. The patient was provided an opportunity to ask questions and all were answered. The patient agreed with the plan and demonstrated an understanding of the instructions.   The patient was advised to call back or seek an in-person evaluation if the symptoms worsen or if the condition fails to improve as anticipated.  I provided 25 minutes of non-face-to-face time during this encounter.  The patient was located at home.  The provider was located at [facility].   [name], NP   Subjective:   Patient ID:  Alexandra Beck is a 44 y.o. (DOB [birth year]) female.  Chief Complaint: No chief complaint on file.   HPI [name] presents for follow-up of obsessional thoughts and depression.  Describes mood today as about the same. Pleasant. Reports some tearfulness. Mood symptoms - reports depression and anxiety. Reports irritability, but does not lash out. Reports lower interest and motivation. Denies recent panic attack. Reports some worry, rumination, and over thinking. Denies obsessive thoughts or acts. Denies intrusive thoughts. Reports mood lower. Stating I feel like I'm a 5 out of 10. Has increased Vraylar  1.5 to 3mg  daily without any improvement. Seeing therapist - Chiropodist. Taking medications as prescribed. Energy levels lower. Active, does not have a regular exercise routine.  Enjoys some usual interests and activities. Married. Lives with wife and 4 cats. Spending time with  family. Gaming online with friends. Appetite adequate. Weight loss. Sleeping well most nights. Averages 6 to 8 hours. Has an upcoming sleep study - 2/18. Reports some focus and concentration difficulties. Completing tasks. Managing minimal aspects of household. Works 12 to 9 - [employer] - [employer].  Denies SI or HI.  Denies AH or VH. Denies self harm. Denies substance use. Seeing Bambi Cottle for therapy.  Previous medication trials: Trialed on Wellbutrin  at age 28, Trintellix -weird dreams, Latuda , Rexulti ,   Review of Systems:  Review of Systems  Musculoskeletal:  Negative for gait problem.  Neurological:  Negative for tremors.  Psychiatric/Behavioral:         Please refer to HPI    Medications: I have reviewed the patient's current medications.  Current Outpatient Medications  Medication Sig Dispense Refill   buPROPion  (WELLBUTRIN  XL) 150 MG 24 hr tablet Take 1 tablet (150 mg total) by mouth daily. 30 tablet 1   cariprazine  (VRAYLAR ) 1.5 MG capsule Take 1 capsule (1.5 mg total) by mouth daily. 30 capsule 2   cholestyramine  light (PREVALITE ) 4 g packet Take 1 packet (4 g total) by mouth 2 times daily 60 packet 1   clonazePAM  (KLONOPIN ) 0.5 MG tablet Take 1 tablet (0.5 mg total) by mouth 2 (two) times daily as needed for anxiety. 4 tablet 0   lamoTRIgine  (LAMICTAL ) 200 MG tablet Take 1 tablet (200 mg total) by mouth daily. 30 tablet 2   levothyroxine  (SYNTHROID ) 150 MCG tablet Take 1 tablet (150 mcg total) by mouth daily before breakfast. Must keep scheduled appt for future refills 90 tablet 3   metoprolol  succinate (TOPROL -XL) 50 MG 24 hr  tablet Take 1 tablet (50 mg total) by mouth daily. Take with or immediately following a meal. 90 tablet 3   modafinil  (PROVIGIL ) 100 MG tablet Take 1 tablet (100 mg total) by mouth daily, along with 200mg  tablet. 30 tablet 3   modafinil  (PROVIGIL ) 200 MG tablet Take 1 tablet (200 mg total) by mouth daily at 9 A.M. 30 tablet 3   omeprazole  (PRILOSEC) 20  MG capsule Take 1 capsule (20 mg total) by mouth daily. Open capsule, granules will dispense in water or can be taken with a spoonful of greek yogurt or applesauce. 180 capsule 0   Psyllium (METAMUCIL) 48.57 % POWD Take 1 packet by mouth 2 (two) times daily. 30 g 1   rosuvastatin  (CRESTOR ) 20 MG tablet Take 1 tablet (20 mg total) by mouth daily. 90 tablet 3   Semaglutide ,0.25 or 0.5MG /DOS, (OZEMPIC , 0.25 OR 0.5 MG/DOSE,) 2 MG/3ML SOPN Inject 0.25 mg into the skin once a week. 3 mL 0   sertraline  (ZOLOFT ) 100 MG tablet Take 2 tablets (200 mg total) by mouth daily. 60 tablet 2   spironolactone  (ALDACTONE ) 25 MG tablet Take 0.5 tablets (12.5 mg total) by mouth daily. 45 tablet 3   torsemide  (DEMADEX ) 20 MG tablet Take 4 tablets (80 mg total) by mouth 2 (two) times daily as needed. 720 tablet 2   triamcinolone  cream (KENALOG ) 0.1 % Apply 1 Application topically 2 (two) times daily. 30 g 1   Zinc 50 MG TABS Take 50 mg by mouth.     No current facility-administered medications for this visit.    Medication Side Effects: None  Allergies: Allergies[1]  Past Medical History:  Diagnosis Date   Acute renal failure (ARF) 11/2012    multifactorial-likely secondary to ATN in the setting of sepsis and hypotension, and also from rhabdomyolysis   Anemia    Cellulitis    Chronic diastolic CHF (congestive heart failure) (HCC)    Depression    Diabetes mellitus (HCC)    GERD (gastroesophageal reflux disease)    Hyperlipidemia    Hypertension    Hypothyroidism    Morbid obesity with BMI of 70 and over, adult (HCC)    Obesity hypoventilation syndrome (HCC)    OSA (obstructive sleep apnea)    PVC's (premature ventricular contractions)    Recurrent cellulitis of lower leg    LLE, venous insuff   Sepsis (HCC) 11/2012   Secondary to cellulitis   Sleep apnea    Venous insufficiency of leg     Family History  Problem Relation Age of Onset   Hypertension Mother    Diabetes Mother    High Cholesterol  Mother    Thyroid  disease Mother    Sleep apnea Mother    Obesity Mother    Hypertension Father    Heart disease Father        before age 47   High Cholesterol Father    Sleep apnea Father    Obesity Father     Social History   Socioeconomic History   Marital status: Married    Spouse name: Not on file   Number of children: 0   Years of education: Not on file   Highest education level: Not on file  Occupational History   Occupation: Librarian    Employer: [employer]  Tobacco Use   Smoking status: Former    Current packs/day: 0.00    Average packs/day: 0.5 packs/day for 8.0 years (4.0 ttl pk-yrs)    Types: Cigarettes  Start date: 2003    Quit date: 2011    Years since quitting: 15.0   Smokeless tobacco: Never  Vaping Use   Vaping status: Never Used  Substance and Sexual Activity   Alcohol use: No    Alcohol/week: 0.0 standard drinks of alcohol   Drug use: No   Sexual activity: Not Currently    Birth control/protection: None  Other Topics Concern   Not on file  Social History Narrative   Librarian, 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)   Social Drivers of Health   Tobacco Use: Medium Risk (12/26/2024)   Patient History    Smoking Tobacco Use: Former    Smokeless Tobacco Use: Never    Passive Exposure: Not on Actuary Strain: Low Risk (06/13/2024)   [facility]   Overall Financial Resource Strain (CARDIA)    How hard is it for you to pay for the very basics like food, housing, medical care, and heating?: Not hard at all  Food Insecurity: No Food Insecurity (06/13/2024)   Received from [facility]   Epic    Within the past 12 months, you worried that your food would run out before you got the money to buy more.: Never true    Within the past 12 months, the food you bought just didn't last and you didn't have money to get more.: Never true  Transportation Needs: No Transportation Needs (06/13/2024)   [facility] from  [facility]   PRAPARE - Transportation    Lack of Transportation (Medical): No    Lack of Transportation (Non-Medical): No  Physical Activity: Not on file  Stress: Not on file  Social Connections: Not on file  Intimate Partner Violence: Not At Risk (02/12/2024)   Humiliation, Afraid, Rape, and Kick questionnaire    Fear of Current or Ex-Partner: No    Emotionally Abused: No    Physically Abused: No    Sexually Abused: No  Depression (PHQ2-9): High Risk (09/13/2024)   Depression (PHQ2-9)    PHQ-2 Score: 12  Alcohol Screen: Not on file  Housing: Low Risk (02/12/2024)   Housing Stability Vital Sign    Unable to Pay for Housing in the Last Year: No    Number of Times Moved in the Last Year: 0    Homeless in the Last Year: No  Utilities: Not At Risk (02/12/2024)   [facility] Utilities    Threatened with loss of utilities: No  Health Literacy: Not on file    Past Medical History, Surgical history, Social history, and Family history were reviewed and updated as appropriate.   Please see review of systems for further details on the patient's review from today.   Objective:   Physical Exam:  There were no vitals taken for this visit.  Physical Exam Constitutional:      General: She is not in acute distress. Musculoskeletal:        General: No deformity.  Neurological:     Mental Status: She is alert and oriented to person, place, and time.     Coordination: Coordination normal.  Psychiatric:        Attention and Perception: Attention and perception normal. She does not perceive auditory or visual hallucinations.        Mood and Affect: Mood normal. Mood is not anxious or depressed. Affect is not labile, blunt, angry or inappropriate.        Speech: Speech normal.  Behavior: Behavior normal.        Thought Content: Thought content normal. Thought content is not paranoid or delusional. Thought content does not include homicidal or suicidal ideation. Thought content does not  include homicidal or suicidal plan.        Cognition and Memory: Cognition and memory normal.        Judgment: Judgment normal.     Comments: Insight intact     Lab Review:     Component Value Date/Time   NA 141 11/12/2024 1255   NA 142 11/13/2023 1325   K 3.8 11/12/2024 1255   CL 103 11/12/2024 1255   CO2 27 11/12/2024 1255   GLUCOSE 90 11/12/2024 1255   BUN 24 (H) 11/12/2024 1255   BUN 20 11/13/2023 1325   CREATININE 1.21 (H) 11/12/2024 1255   CREATININE 1.06 11/14/2016 1050   CALCIUM  9.2 11/12/2024 1255   CALCIUM  9.6 06/26/2007 0000   PROT 7.0 11/12/2024 1255   PROT 6.6 11/13/2023 1325   ALBUMIN  3.8 11/12/2024 1255   ALBUMIN  3.7 (L) 11/13/2023 1325   AST 18 11/12/2024 1255   ALT 17 11/12/2024 1255   ALKPHOS 107 11/12/2024 1255   BILITOT 0.2 11/12/2024 1255   GFRNONAA 56 (L) 11/12/2024 1255   GFRAA 81 11/11/2020 1211       Component Value Date/Time   WBC 7.6 11/12/2024 1255   RBC 3.65 (L) 11/12/2024 1255   HGB 9.8 (L) 11/12/2024 1255   HGB 8.2 (L) 07/22/2024 1127   HGB 8.7 (L) 10/13/2023 1058   HCT 31.9 (L) 11/12/2024 1255   HCT 27.4 (L) 11/13/2023 1325   PLT 214 11/12/2024 1255   PLT 252 07/22/2024 1127   PLT 316 10/13/2023 1058   MCV 87.4 11/12/2024 1255   MCV 88 10/13/2023 1058   MCH 26.8 11/12/2024 1255   MCHC 30.7 11/12/2024 1255   RDW 17.4 (H) 11/12/2024 1255   RDW 15.4 10/13/2023 1058   LYMPHSABS 1.2 11/12/2024 1255   LYMPHSABS 1.2 03/13/2023 1511   MONOABS 0.5 11/12/2024 1255   EOSABS 0.1 11/12/2024 1255   EOSABS 0.2 03/13/2023 1511   BASOSABS 0.0 11/12/2024 1255   BASOSABS 0.0 03/13/2023 1511    No results found for: POCLITH, LITHIUM   No results found for: PHENYTOIN, PHENOBARB, VALPROATE, CBMZ   .res Assessment: Plan:    Plan:   PDMP reviewed  Add: Add Wellbutrin  XL 150mg  daily - will start 2 weeks   Change: Decrease Vraylar  3mg  to 1.5mg  daily   Continue: Zoloft  100mg  daily - take two tablets daily Lamictal  200mg   at hs  RTC 4 weeks  15 minutes spent dedicated to the care of this patient on the date of this encounter to include pre-visit review of records, ordering of medication, post visit documentation, and face-to-face time with the patient discussing depression, anxiety, insomnia and obsessional thoughts. Discussed continuing current medication regimen.  Patient advised to contact office with any questions, adverse effects, or acute worsening in signs and symptoms.  Discussed potential metabolic side effects associated with atypical antipsychotics, as well as potential risk for movement side effects. Advised pt to contact office if movement side effects occur.  Diagnoses and all orders for this visit:  Major depressive disorder, recurrent episode, in partial remission  PTSD (post-traumatic stress disorder)  Insomnia, unspecified type  Generalized anxiety disorder  Major depressive disorder, recurrent episode, moderate (HCC) -     Discontinue: cariprazine  (VRAYLAR ) 1.5 MG capsule; Take 2 capsules (3 mg total) by mouth daily. -  cariprazine  (VRAYLAR ) 1.5 MG capsule; Take 1 capsule (1.5 mg total) by mouth daily. -     buPROPion  (WELLBUTRIN  XL) 150 MG 24 hr tablet; Take 1 tablet (150 mg total) by mouth daily.     Please see After Visit Summary for patient specific instructions.  Future Appointments  Date Time Provider Department Center  12/26/2024  2:00 PM [name] Celena BROCKS, PT [facility] None  12/27/2024  9:40 AM [name] Alan SAUNDERS, PA-C [facility]  12/27/2024  1:00 PM Zaunegger, Prentice RAMAN, PT [facility] [id number]  12/30/2024  1:20 PM Alvia Corean CROME, FNP [facility]  01/01/2025  1:00 PM Cottle, Peggye MATSU, LCSW [facility] None  01/02/2025  1:00 PM Zaunegger, Prentice RAMAN, PT [facility] [address]  01/09/2025  1:00 PM Zaunegger, Prentice RAMAN, PT [facility] [address]  01/15/2025  1:00 PM Cottle, Peggye MATSU, LCSW [facility] None  01/15/2025  8:00 PM Neda Jennet LABOR, MD [facility]  01/20/2025  12:20 PM [place] Adelita PENNER, MD [facility] None  01/29/2025  1:00 PM Cottle, Peggye MATSU, LCSW [facility] None  02/11/2025  1:30 PM [facility] None  02/11/2025  2:00 PM Iruku, Amber, MD [facility] None  02/12/2025  1:00 PM Cottle, Bambi G, LCSW [facility] None    No orders of the defined types were placed in this encounter.     -------------------------------      [1]  Allergies Allergen Reactions   Aspirin Hives, Swelling and Other (See Comments)    REACTION: throat swelling, hives  Other reaction(s): Unknown  REACTION: throat swelling, hives Other reaction(s): Unknown Other reaction(s): Unknown   Doxycycline Other (See Comments)    Abdominal pain, difficulty swallowing   Lisinopril Cough   Niacin     Other reaction(s): Unknown Other reaction(s): Unknown   Niacin And Related     Other reaction(s): Unknown Other reaction(s): Unknown   Niaspan [Niacin Er (Antihyperlipidemic)]     Caused flushing   Wound Dressing Adhesive Itching    Welts and burning of the skin   Wound Dressings Itching    Welts and burning of the skin

## 2024-12-26 NOTE — Patient Instructions (Signed)
 Toileting Mechanics 101: Urination  Positioning: sit all the way down on the toilet, feet flat on the floor, trunk relaxed Hovering over the toilet seat can impact your ability to relax the pelvic floor musculature and empty your bladder well  When you initiate urination, try to let the urine flow out naturally rather than pushing down. Pushing can limit the ability of your urethral sphincter to relax and open. Double voiding technique: When you think you are done urinating, try gently pushing to see if there is any remaining urine that can be expelled   You can try the following techniques to see if there is any remaining urine that can be expelled: Rocking back and forth Leaning side to side  Twisting your trunk  Taking deep belly breaths to promote blood flow to pelvic floor musculature  Defecation  Positioning: sit all the way down on the toilet, feet flat on the floor, trunk relaxed You can try using a squatty potty or toilet stool under your feet to change the angle of your rectum in your pelvic floor, making it easier to pass stool with less straining  Avoid straining or breath holding while on the toilet. This causes increased intra-abdominal pressure, which causes increased pressure down through the pelvic floor. We want to avoid this if possible. If you do feel the need to push to pass bowel movements, practice the following technique instead: Imagine you are fogging up a mirror with your breath as you exhale and gently push down and out through your pelvic floor. This relieves some pressure while you're still able to push the stool out.  You can try the following techniques to see if there is any remaining stool that can be expelled: Rocking back and forth Leaning side to side  Twisting your trunk  Taking deep belly breaths to promote blood flow to pelvic floor musculature

## 2024-12-27 ENCOUNTER — Other Ambulatory Visit

## 2024-12-27 ENCOUNTER — Ambulatory Visit (HOSPITAL_BASED_OUTPATIENT_CLINIC_OR_DEPARTMENT_OTHER): Admitting: Physical Therapy

## 2024-12-27 ENCOUNTER — Other Ambulatory Visit: Payer: Self-pay

## 2024-12-27 ENCOUNTER — Ambulatory Visit: Admitting: Physician Assistant

## 2024-12-27 ENCOUNTER — Encounter: Payer: Self-pay | Admitting: Physician Assistant

## 2024-12-27 VITALS — BP 124/78 | HR 62 | Ht 62.0 in | Wt 272.0 lb

## 2024-12-27 DIAGNOSIS — Z9884 Bariatric surgery status: Secondary | ICD-10-CM | POA: Diagnosis not present

## 2024-12-27 DIAGNOSIS — D509 Iron deficiency anemia, unspecified: Secondary | ICD-10-CM

## 2024-12-27 DIAGNOSIS — R197 Diarrhea, unspecified: Secondary | ICD-10-CM | POA: Diagnosis not present

## 2024-12-27 DIAGNOSIS — K219 Gastro-esophageal reflux disease without esophagitis: Secondary | ICD-10-CM

## 2024-12-27 DIAGNOSIS — K5720 Diverticulitis of large intestine with perforation and abscess without bleeding: Secondary | ICD-10-CM | POA: Diagnosis not present

## 2024-12-27 DIAGNOSIS — N321 Vesicointestinal fistula: Secondary | ICD-10-CM

## 2024-12-27 DIAGNOSIS — D649 Anemia, unspecified: Secondary | ICD-10-CM | POA: Diagnosis not present

## 2024-12-27 DIAGNOSIS — K289 Gastrojejunal ulcer, unspecified as acute or chronic, without hemorrhage or perforation: Secondary | ICD-10-CM

## 2024-12-27 DIAGNOSIS — L988 Other specified disorders of the skin and subcutaneous tissue: Secondary | ICD-10-CM

## 2024-12-27 MED ORDER — BENEFIBER PO POWD
1.0000 | Freq: Every day | ORAL | 2 refills | Status: AC
  Filled 2024-12-27: qty 475, fill #0

## 2024-12-27 NOTE — Patient Instructions (Addendum)
 VISIT SUMMARY:  You will be contacted by [facility] in the next 2 days to arrange a MR Pelvis.  The number on your caller ID will be [phone], please answer when they call.  If you have not heard from them in 2 days please call [phone] to schedule.     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 We have outlined a plan to manage your symptoms and scheduled further evaluations to better understand your condition.  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 This is likely due to changes in your digestive system after your bariatric surgery. -Start taking a fiber supplement (Benefiber). -Gradually increase cholestyramine  up to twice daily, with the option to increase to 1.5 packets twice daily as tolerated. -If symptoms persist after adjusting fiber and cholestyramine , try the pancreatic enzyme (Creon) samples provided. -Add fiber for one week, then increase cholestyramine  if no improvement, and trial Creon if symptoms remain uncontrolled.  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 -It is important to take fiber supplements to prevent progression. -A colonoscopy will be scheduled to assess for active inflammation, bleeding, and to obtain biopsies depending on the talk with your surgeon, will consider with an EGD. -Empiric antibiotics (ciprofloxacin /metronidazole ) are withheld due to the absence of acute infection and the risk of antibiotic-induced diarrhea.  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 -A colonoscopy with targeted biopsies will be scheduled to confirm SCAD and rule out other causes. -Anti-inflammatory therapy (mesalamine) is deferred until the diagnosis is confirmed and infection is excluded. -You have been provided with education regarding SCAD and its symptoms.   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 -A pelvic MRI has been ordered to evaluate for a persistent colovesical fistula. -A colonoscopy to assess for a fistula and correlate with imaging findings. -Your case has been discussed with a colorectal surgeon to coordinate the timing of the colonoscopy and EGD, and to determine surgical planning if the fistula persists.   Exocrine Pancreatic Insufficiency (EPI) EPI is a condition in which your body doesn't provide enough pancreatic enzymes to properly digest your food (which can sometimes lead to some unpleasant digestive symptoms such as bloating, AB pain and loose stools, weight loss).   For many people, EPI is also a chronic lifelong condition.   That's why its so important to know what to expect with your EPI treatment, because with the right plan in place, EPI is manageable.  HOW DO I TAKE PANCREATIC ENZYMES?  Pancreatic Enzyme Replacement therapy (Creon) is only available through prescription and cannot be substituted with over the counter alternatives.   Your doctor will personalize your dose based on your weight, diet, and symptoms.  The number of capsules you take per meal will depend on your prescribed dose.  Creon must be taken DURING every meal and snack.   Whether its a full meal or a snack, take Creon every time you eat. Remember to follow your  treatment plan closely and take Creon exactly as  prescribed - consistency is key!!  Dose adjustments are normal with Creon.  Your doctor will start your on the dose they deem appropriate for you.   Remember to follow up with your doctor after the first two weeks to ensure your therapy is effective and you're managing your treatment appropriately.   ? What is SCAD? Segmental Colitis Associated with Diverticulosis (SCAD) is a condition that causes inflammation in parts of the colon (large intestine) that also have diverticulosis (small pouches or sacs in the bowel wall). Diverticulosis is common, especially as people age. SCAD refers specifically to inflammation between or around these diverticula, usually in the sigmoid colon (lower left side of the colon).  ?? What Causes SCAD? The exact cause isnt fully understood, but it may involve: Local immune response to changes in gut bacteria Mild trauma from stool passing through narrowed segments Age-related changes in the colon SCAD is not caused by poor diet or stress, although fiber intake can play a role in bowel health.  ?? Symptoms of SCAD can vary in severity, and may include: Mild to moderate rectal bleeding (bright red blood) Abdominal discomfort, especially in the lower left Changes in bowel habits, such as diarrhea or constipation Tenesmus (feeling of needing to have a bowel movement even when you don't) Most patients are otherwise well and dont have significant weight loss or systemic illness.  ?? Diagnosis SCAD is usually diagnosed by: Colonoscopy - shows inflamed mucosa near diverticula, usually in the sigmoid colon. Biopsy - samples taken during colonoscopy show inflammation under the microscope.  ?? Treatment SCAD is often mild and may go away on its own.  However, treatments can help control symptoms:  5-ASA (mesalamine) - anti-inflammatory medication taken orally or as a suppository Antibiotics, sometimes used short-term Fiber supplements - to maintain healthy  bowel movements Steroids - rarely needed, only for more severe cases Surgery is rarely needed, only in persistent or complicated cases.  ?? Prognosis SCAD usually has a benign and self-limiting course. Most people recover without long-term problems. It does not increase your risk of colon cancer or IBD. Recurrence is possible, but usually mild.  ?? Lifestyle Tips Eat a high-fiber diet (fruits, vegetables, whole grains) unless told otherwise by your doctor. Stay hydrated. Avoid smoking. Exercise regularly for gut motility.  ?? When to call us  Rectal bleeding that continues or worsens Severe abdominal pain Fever or chills Weight loss Major changes in bowel habits  ?? Notes SCAD is not cancer. It is different from more serious inflammatory diseases. Early diagnosis and simple treatment can lead to excellent outcomes.  Due to recent changes in healthcare laws, you may see the results of your imaging and laboratory studies on MyChart before your provider has had a chance to review them.  We understand that in some cases there may be results that are confusing or concerning to you. Not all laboratory results come back in the same time frame and the provider may be waiting for multiple results in order to interpret others.  Please give us  48 hours in order for your provider to thoroughly review all the results before contacting the office for clarification of your results.    I appreciate the  opportunity to care for you  Thank You   [name]

## 2024-12-28 ENCOUNTER — Other Ambulatory Visit (HOSPITAL_COMMUNITY): Payer: Self-pay

## 2024-12-29 ENCOUNTER — Other Ambulatory Visit (HOSPITAL_COMMUNITY): Payer: Self-pay

## 2024-12-30 ENCOUNTER — Telehealth: Payer: Self-pay

## 2024-12-30 ENCOUNTER — Other Ambulatory Visit (HOSPITAL_COMMUNITY): Payer: Self-pay

## 2024-12-30 ENCOUNTER — Ambulatory Visit: Admitting: Family Medicine

## 2024-12-30 NOTE — Telephone Encounter (Signed)
 LVM for patient in regards to visit today. Office closed due to the weather

## 2024-12-30 NOTE — Telephone Encounter (Unsigned)
 Copied from CRM #[id number]. Topic: Appointments - Appointment Cancel/Reschedule >> Dec 30, 2024  9:57 AM Deleta RAMAN wrote: Patient/patient representative is calling to cancel or reschedule an appointment. Refer to attachments for appointment information. Unable to reschedule provider schedule is blocked

## 2025-01-01 ENCOUNTER — Ambulatory Visit: Admitting: Psychology

## 2025-01-01 DIAGNOSIS — F3341 Major depressive disorder, recurrent, in partial remission: Secondary | ICD-10-CM

## 2025-01-01 DIAGNOSIS — F4310 Post-traumatic stress disorder, unspecified: Secondary | ICD-10-CM

## 2025-01-01 NOTE — Progress Notes (Unsigned)
                edge,  experiencing concentration difficulties, having trouble falling or staying asleep, exhibiting a general  state of irritability).: No Description Entered (Status: improved). Motor tension (e.g., restlessness,  tiredness, shakiness, muscle tension).: No Description Entered (Status: improved).  Problems Addressed  Anxiety, Phase Of Life Problems, Anxiety  Goals 1. Learn and implement coping skills that result in a reduction of anxiety  and worry, and improved daily functioning. Objective Learn  and implement calming skills to reduce overall anxiety and manage anxiety symptoms. Target Date: 2025-08-09Frequency: Weekly Progress: 40 Modality: individual  Related Interventions 1. Teach the client calming/relaxation skills (e.g., applied relaxation, progressive muscle  relaxation, cue controlled relaxation; mindful breathing; biofeedback) and how to discriminate  better between relaxation and tension; teach the client how to apply these skills to his/her daily  life (e.g., New Directions in Progressive Muscle Relaxation by Marcelyn Ditty, and  Hazlett-Stevens; Treating Generalized Anxiety Disorder by Rygh and Ida Rogue). Objective Identify, challenge, and replace biased, fearful self-talk with positive, realistic, and empowering selftalk. Target Date: 2024-07-06 Frequency: weekly Progress: 30 Modality: individual Related Interventions 1. Explore the client's schema and self-talk that mediate his/her fear response; assist him/her in  challenging the biases; replace the distorted messages with reality-based alternatives and  positive, realistic self-talk that will increase his/her self-confidence in coping with irrational  fears (see Cognitive Therapy of Anxiety Disorders by Laurence Slate). Objective Learn and implement problem-solving strategies for realistically addressing worries. Target Date: 2025-08-09Frequency: weekly Progress: 40 Modality: individual 2. Resolve conflicted feelings and adapt to the new life circumstances. Objective Apply problem-solving skills to current circumstances. Target Date: 2024-07-06 Frequency: weekly Progress: 20 Modality: individual Related Interventions 1. Teach the client problem-resolution skills (e.g., defining the problem clearly, brainstorming  multiple solutions, listing the pros and cons of each solution, seeking input from others,  selecting and implementing a plan of action, evaluating outcome, and readjusting plan as   necessary).   3. Stabilize anxiety level while increasing ability to function on a daily  basis. 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  The patient approved this plan.   Deonna Krummel G Ethridge Sollenberger, LCSW

## 2025-01-02 ENCOUNTER — Ambulatory Visit (HOSPITAL_BASED_OUTPATIENT_CLINIC_OR_DEPARTMENT_OTHER): Attending: Family Medicine | Admitting: Physical Therapy

## 2025-01-02 ENCOUNTER — Telehealth (HOSPITAL_BASED_OUTPATIENT_CLINIC_OR_DEPARTMENT_OTHER): Payer: Self-pay | Admitting: Physical Therapy

## 2025-01-02 NOTE — Telephone Encounter (Signed)
 Patient no show, left message for patient regarding missed appointment and reminded her of her next appointment.   1:26 PM, 01/02/25 Prentice CANDIE Stains PT, DPT Physical Therapist at [facility]

## 2025-01-03 NOTE — Progress Notes (Signed)
 Agree with the assessment and plan as outlined by Alan Coombs, PA-C.  Agree with reaching out to Colorectal Surgery.  Repeat colonoscopy can be done, but would be elevated risk.  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  MRI pelvis would be helpful to evaluate for presence of fistula and rule out abscess.  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    This patient has highly complex GI disease and my strongest recommendation would to consolidate her care at [facility].  She has previously followed with GI at [facility], including endoscopy last month, and also follows with Surgery there as well.  Therefore, I recommend keeping her care at the larger academic center that has more therapeutic capabilities and allows for a streamlined multidisciplinary approach.    Captain Blucher, DO, [facility]

## 2025-01-07 ENCOUNTER — Ambulatory Visit: Admitting: Psychology

## 2025-01-08 ENCOUNTER — Ambulatory Visit (HOSPITAL_COMMUNITY)

## 2025-01-09 ENCOUNTER — Encounter (HOSPITAL_BASED_OUTPATIENT_CLINIC_OR_DEPARTMENT_OTHER): Admitting: Physical Therapy

## 2025-01-15 ENCOUNTER — Ambulatory Visit: Admitting: Psychology

## 2025-01-15 ENCOUNTER — Ambulatory Visit (HOSPITAL_BASED_OUTPATIENT_CLINIC_OR_DEPARTMENT_OTHER): Admitting: Pulmonary Disease

## 2025-01-20 ENCOUNTER — Ambulatory Visit (INDEPENDENT_AMBULATORY_CARE_PROVIDER_SITE_OTHER): Admitting: Family Medicine

## 2025-01-21 ENCOUNTER — Ambulatory Visit: Admitting: Psychology

## 2025-01-29 ENCOUNTER — Ambulatory Visit: Admitting: Psychology

## 2025-01-31 ENCOUNTER — Ambulatory Visit: Admitting: Physical Therapy

## 2025-02-05 ENCOUNTER — Ambulatory Visit: Admitting: Psychology

## 2025-02-05 ENCOUNTER — Ambulatory Visit: Admitting: Physical Therapy

## 2025-02-11 ENCOUNTER — Inpatient Hospital Stay: Attending: Hematology and Oncology

## 2025-02-11 ENCOUNTER — Inpatient Hospital Stay: Admitting: Hematology and Oncology

## 2025-02-12 ENCOUNTER — Ambulatory Visit: Admitting: Psychology

## 2025-02-13 ENCOUNTER — Ambulatory Visit: Admitting: Physical Therapy

## 2025-02-18 ENCOUNTER — Ambulatory Visit: Admitting: Physical Therapy

## 2025-02-19 ENCOUNTER — Ambulatory Visit: Admitting: Psychology

## 2025-02-25 ENCOUNTER — Ambulatory Visit: Admitting: Physical Therapy

## 2025-02-26 ENCOUNTER — Ambulatory Visit: Admitting: Psychology

## 2025-03-04 ENCOUNTER — Ambulatory Visit: Admitting: Physical Therapy
